# Patient Record
Sex: Female | Born: 1935 | Race: White | Hispanic: No | Smoking: Former smoker
Health system: Southern US, Community
[De-identification: ages and names within clinical notes are randomized; demographics above are authoritative.]

## PROBLEM LIST (undated history)

## (undated) DIAGNOSIS — Z8669 Personal history of other diseases of the nervous system and sense organs: Secondary | ICD-10-CM

## (undated) DIAGNOSIS — T4145XA Adverse effect of unspecified anesthetic, initial encounter: Secondary | ICD-10-CM

## (undated) DIAGNOSIS — K219 Gastro-esophageal reflux disease without esophagitis: Secondary | ICD-10-CM

## (undated) DIAGNOSIS — N3010 Interstitial cystitis (chronic) without hematuria: Secondary | ICD-10-CM

## (undated) DIAGNOSIS — H6980 Other specified disorders of Eustachian tube, unspecified ear: Secondary | ICD-10-CM

## (undated) DIAGNOSIS — C50919 Malignant neoplasm of unspecified site of unspecified female breast: Secondary | ICD-10-CM

## (undated) DIAGNOSIS — R5382 Chronic fatigue, unspecified: Secondary | ICD-10-CM

## (undated) DIAGNOSIS — E119 Type 2 diabetes mellitus without complications: Secondary | ICD-10-CM

## (undated) DIAGNOSIS — M1990 Unspecified osteoarthritis, unspecified site: Secondary | ICD-10-CM

## (undated) DIAGNOSIS — H9190 Unspecified hearing loss, unspecified ear: Secondary | ICD-10-CM

## (undated) DIAGNOSIS — G4700 Insomnia, unspecified: Secondary | ICD-10-CM

## (undated) DIAGNOSIS — N281 Cyst of kidney, acquired: Secondary | ICD-10-CM

## (undated) DIAGNOSIS — M797 Fibromyalgia: Secondary | ICD-10-CM

## (undated) DIAGNOSIS — T8859XA Other complications of anesthesia, initial encounter: Secondary | ICD-10-CM

## (undated) DIAGNOSIS — R42 Dizziness and giddiness: Secondary | ICD-10-CM

## (undated) DIAGNOSIS — J449 Chronic obstructive pulmonary disease, unspecified: Secondary | ICD-10-CM

## (undated) DIAGNOSIS — H6990 Unspecified Eustachian tube disorder, unspecified ear: Secondary | ICD-10-CM

## (undated) HISTORY — DX: Unspecified hearing loss, unspecified ear: H91.90

## (undated) HISTORY — PX: BREAST CYST ASPIRATION: SHX578

## (undated) HISTORY — DX: Dizziness and giddiness: R42

## (undated) HISTORY — DX: Other specified disorders of Eustachian tube, unspecified ear: H69.80

## (undated) HISTORY — DX: Unspecified osteoarthritis, unspecified site: M19.90

## (undated) HISTORY — DX: Chronic fatigue, unspecified: R53.82

## (undated) HISTORY — DX: Cyst of kidney, acquired: N28.1

## (undated) HISTORY — DX: Malignant neoplasm of unspecified site of unspecified female breast: C50.919

## (undated) HISTORY — PX: NISSEN FUNDOPLICATION: SHX2091

## (undated) HISTORY — DX: Insomnia, unspecified: G47.00

## (undated) HISTORY — DX: Gastro-esophageal reflux disease without esophagitis: K21.9

## (undated) HISTORY — DX: Unspecified eustachian tube disorder, unspecified ear: H69.90

## (undated) HISTORY — DX: Fibromyalgia: M79.7

## (undated) HISTORY — DX: Interstitial cystitis (chronic) without hematuria: N30.10

## (undated) HISTORY — DX: Chronic obstructive pulmonary disease, unspecified: J44.9

---

## 1997-07-19 ENCOUNTER — Ambulatory Visit (HOSPITAL_COMMUNITY): Admission: RE | Admit: 1997-07-19 | Discharge: 1997-07-19 | Payer: Self-pay | Admitting: Gastroenterology

## 1997-09-24 ENCOUNTER — Ambulatory Visit (HOSPITAL_COMMUNITY): Admission: RE | Admit: 1997-09-24 | Discharge: 1997-09-24 | Payer: Self-pay | Admitting: Urology

## 1997-12-28 ENCOUNTER — Other Ambulatory Visit: Admission: RE | Admit: 1997-12-28 | Discharge: 1997-12-28 | Payer: Self-pay | Admitting: Gynecology

## 1998-01-04 ENCOUNTER — Ambulatory Visit (HOSPITAL_BASED_OUTPATIENT_CLINIC_OR_DEPARTMENT_OTHER): Admission: RE | Admit: 1998-01-04 | Discharge: 1998-01-04 | Payer: Self-pay | Admitting: Urology

## 1998-07-22 ENCOUNTER — Ambulatory Visit (HOSPITAL_COMMUNITY): Admission: RE | Admit: 1998-07-22 | Discharge: 1998-07-22 | Payer: Self-pay | Admitting: *Deleted

## 1998-07-26 ENCOUNTER — Encounter: Payer: Self-pay | Admitting: *Deleted

## 1998-07-26 ENCOUNTER — Ambulatory Visit (HOSPITAL_COMMUNITY): Admission: RE | Admit: 1998-07-26 | Discharge: 1998-07-26 | Payer: Self-pay | Admitting: *Deleted

## 1998-09-02 ENCOUNTER — Other Ambulatory Visit: Admission: RE | Admit: 1998-09-02 | Discharge: 1998-09-02 | Payer: Self-pay | Admitting: Orthopedic Surgery

## 1998-10-11 ENCOUNTER — Ambulatory Visit (HOSPITAL_BASED_OUTPATIENT_CLINIC_OR_DEPARTMENT_OTHER): Admission: RE | Admit: 1998-10-11 | Discharge: 1998-10-11 | Payer: Self-pay | Admitting: Orthopedic Surgery

## 1999-01-06 ENCOUNTER — Other Ambulatory Visit: Admission: RE | Admit: 1999-01-06 | Discharge: 1999-01-06 | Payer: Self-pay | Admitting: Gynecology

## 1999-01-16 ENCOUNTER — Encounter: Admission: RE | Admit: 1999-01-16 | Discharge: 1999-01-16 | Payer: Self-pay | Admitting: *Deleted

## 1999-01-16 ENCOUNTER — Encounter: Payer: Self-pay | Admitting: *Deleted

## 1999-05-15 ENCOUNTER — Ambulatory Visit (HOSPITAL_COMMUNITY): Admission: RE | Admit: 1999-05-15 | Discharge: 1999-05-15 | Payer: Self-pay | Admitting: Gastroenterology

## 1999-06-16 ENCOUNTER — Encounter: Payer: Self-pay | Admitting: Surgery

## 1999-06-16 ENCOUNTER — Ambulatory Visit (HOSPITAL_COMMUNITY): Admission: RE | Admit: 1999-06-16 | Discharge: 1999-06-16 | Payer: Self-pay | Admitting: Surgery

## 1999-07-21 ENCOUNTER — Encounter: Admission: RE | Admit: 1999-07-21 | Discharge: 1999-07-21 | Payer: Self-pay | Admitting: Urology

## 1999-07-21 ENCOUNTER — Encounter: Payer: Self-pay | Admitting: Urology

## 1999-07-24 ENCOUNTER — Encounter: Admission: RE | Admit: 1999-07-24 | Discharge: 1999-07-24 | Payer: Self-pay | Admitting: Urology

## 1999-07-24 ENCOUNTER — Encounter: Payer: Self-pay | Admitting: Urology

## 1999-07-25 ENCOUNTER — Encounter: Admission: RE | Admit: 1999-07-25 | Discharge: 1999-07-25 | Payer: Self-pay | Admitting: *Deleted

## 1999-07-25 ENCOUNTER — Encounter: Payer: Self-pay | Admitting: *Deleted

## 1999-08-24 ENCOUNTER — Encounter: Payer: Self-pay | Admitting: Internal Medicine

## 1999-08-24 ENCOUNTER — Encounter: Admission: RE | Admit: 1999-08-24 | Discharge: 1999-08-24 | Payer: Self-pay | Admitting: Internal Medicine

## 1999-10-25 ENCOUNTER — Encounter: Payer: Self-pay | Admitting: Surgery

## 1999-10-26 ENCOUNTER — Inpatient Hospital Stay (HOSPITAL_COMMUNITY): Admission: EM | Admit: 1999-10-26 | Discharge: 1999-10-27 | Payer: Self-pay | Admitting: Surgery

## 2000-01-31 ENCOUNTER — Other Ambulatory Visit: Admission: RE | Admit: 2000-01-31 | Discharge: 2000-01-31 | Payer: Self-pay | Admitting: Gynecology

## 2000-02-06 ENCOUNTER — Other Ambulatory Visit: Admission: RE | Admit: 2000-02-06 | Discharge: 2000-02-06 | Payer: Self-pay | Admitting: Gastroenterology

## 2000-04-16 ENCOUNTER — Encounter (INDEPENDENT_AMBULATORY_CARE_PROVIDER_SITE_OTHER): Payer: Self-pay | Admitting: Specialist

## 2000-04-16 ENCOUNTER — Ambulatory Visit (HOSPITAL_COMMUNITY): Admission: RE | Admit: 2000-04-16 | Discharge: 2000-04-16 | Payer: Self-pay | Admitting: Gastroenterology

## 2000-08-12 ENCOUNTER — Encounter: Admission: RE | Admit: 2000-08-12 | Discharge: 2000-08-12 | Payer: Self-pay | Admitting: *Deleted

## 2000-08-12 ENCOUNTER — Encounter: Payer: Self-pay | Admitting: *Deleted

## 2001-02-04 ENCOUNTER — Other Ambulatory Visit: Admission: RE | Admit: 2001-02-04 | Discharge: 2001-02-04 | Payer: Self-pay | Admitting: Obstetrics and Gynecology

## 2001-08-15 ENCOUNTER — Encounter: Payer: Self-pay | Admitting: *Deleted

## 2001-08-15 ENCOUNTER — Encounter: Admission: RE | Admit: 2001-08-15 | Discharge: 2001-08-15 | Payer: Self-pay | Admitting: *Deleted

## 2002-02-04 ENCOUNTER — Other Ambulatory Visit: Admission: RE | Admit: 2002-02-04 | Discharge: 2002-02-04 | Payer: Self-pay | Admitting: Obstetrics and Gynecology

## 2002-08-17 ENCOUNTER — Encounter: Payer: Self-pay | Admitting: *Deleted

## 2002-08-17 ENCOUNTER — Encounter: Admission: RE | Admit: 2002-08-17 | Discharge: 2002-08-17 | Payer: Self-pay | Admitting: *Deleted

## 2002-09-18 ENCOUNTER — Ambulatory Visit: Admission: RE | Admit: 2002-09-18 | Discharge: 2002-09-18 | Payer: Self-pay | Admitting: Endocrinology

## 2002-09-20 ENCOUNTER — Encounter: Payer: Self-pay | Admitting: Emergency Medicine

## 2002-09-20 ENCOUNTER — Emergency Department (HOSPITAL_COMMUNITY): Admission: EM | Admit: 2002-09-20 | Discharge: 2002-09-20 | Payer: Self-pay | Admitting: Emergency Medicine

## 2003-01-08 ENCOUNTER — Other Ambulatory Visit: Admission: RE | Admit: 2003-01-08 | Discharge: 2003-01-08 | Payer: Self-pay | Admitting: Obstetrics and Gynecology

## 2003-01-14 ENCOUNTER — Ambulatory Visit (HOSPITAL_COMMUNITY): Admission: RE | Admit: 2003-01-14 | Discharge: 2003-01-15 | Payer: Self-pay | Admitting: Ophthalmology

## 2003-03-16 ENCOUNTER — Ambulatory Visit (HOSPITAL_COMMUNITY): Admission: RE | Admit: 2003-03-16 | Discharge: 2003-03-16 | Payer: Self-pay | Admitting: Ophthalmology

## 2003-05-24 ENCOUNTER — Encounter: Admission: RE | Admit: 2003-05-24 | Discharge: 2003-05-24 | Payer: Self-pay | Admitting: Gastroenterology

## 2003-06-10 ENCOUNTER — Ambulatory Visit (HOSPITAL_COMMUNITY): Admission: RE | Admit: 2003-06-10 | Discharge: 2003-06-10 | Payer: Self-pay | Admitting: Urology

## 2003-08-19 ENCOUNTER — Encounter: Admission: RE | Admit: 2003-08-19 | Discharge: 2003-08-19 | Payer: Self-pay | Admitting: Surgery

## 2003-11-23 ENCOUNTER — Encounter: Payer: Self-pay | Admitting: Internal Medicine

## 2003-11-25 ENCOUNTER — Ambulatory Visit (HOSPITAL_COMMUNITY): Admission: RE | Admit: 2003-11-25 | Discharge: 2003-11-25 | Payer: Self-pay | Admitting: Neurology

## 2003-12-31 ENCOUNTER — Ambulatory Visit (HOSPITAL_COMMUNITY): Admission: RE | Admit: 2003-12-31 | Discharge: 2003-12-31 | Payer: Self-pay | Admitting: Ophthalmology

## 2004-04-27 ENCOUNTER — Ambulatory Visit: Payer: Self-pay | Admitting: Internal Medicine

## 2004-09-12 ENCOUNTER — Encounter: Admission: RE | Admit: 2004-09-12 | Discharge: 2004-09-12 | Payer: Self-pay | Admitting: Surgery

## 2004-09-19 ENCOUNTER — Encounter: Admission: RE | Admit: 2004-09-19 | Discharge: 2004-09-19 | Payer: Self-pay | Admitting: Surgery

## 2004-09-25 ENCOUNTER — Other Ambulatory Visit: Admission: RE | Admit: 2004-09-25 | Discharge: 2004-09-25 | Payer: Self-pay | Admitting: Obstetrics and Gynecology

## 2004-10-31 ENCOUNTER — Ambulatory Visit: Payer: Self-pay | Admitting: Internal Medicine

## 2004-12-20 ENCOUNTER — Ambulatory Visit: Payer: Self-pay | Admitting: Pulmonary Disease

## 2005-01-31 ENCOUNTER — Encounter: Payer: Self-pay | Admitting: Internal Medicine

## 2005-04-17 ENCOUNTER — Ambulatory Visit: Payer: Self-pay | Admitting: Internal Medicine

## 2005-04-30 ENCOUNTER — Ambulatory Visit: Payer: Self-pay | Admitting: Internal Medicine

## 2005-06-18 ENCOUNTER — Ambulatory Visit: Payer: Self-pay | Admitting: Internal Medicine

## 2005-06-19 ENCOUNTER — Ambulatory Visit: Payer: Self-pay | Admitting: Internal Medicine

## 2005-08-13 ENCOUNTER — Ambulatory Visit: Payer: Self-pay | Admitting: Internal Medicine

## 2005-08-14 ENCOUNTER — Ambulatory Visit: Payer: Self-pay | Admitting: Internal Medicine

## 2005-08-16 ENCOUNTER — Ambulatory Visit: Payer: Self-pay | Admitting: Internal Medicine

## 2005-08-21 ENCOUNTER — Ambulatory Visit: Payer: Self-pay | Admitting: Internal Medicine

## 2005-09-11 ENCOUNTER — Ambulatory Visit: Payer: Self-pay | Admitting: Internal Medicine

## 2005-09-26 ENCOUNTER — Ambulatory Visit: Payer: Self-pay | Admitting: Internal Medicine

## 2005-09-28 ENCOUNTER — Ambulatory Visit: Payer: Self-pay | Admitting: Internal Medicine

## 2005-10-15 ENCOUNTER — Encounter: Admission: RE | Admit: 2005-10-15 | Discharge: 2005-10-15 | Payer: Self-pay | Admitting: Surgery

## 2005-10-22 ENCOUNTER — Encounter: Admission: RE | Admit: 2005-10-22 | Discharge: 2005-10-22 | Payer: Self-pay | Admitting: Surgery

## 2005-12-03 ENCOUNTER — Ambulatory Visit: Payer: Self-pay | Admitting: Internal Medicine

## 2005-12-07 ENCOUNTER — Ambulatory Visit: Payer: Self-pay | Admitting: Internal Medicine

## 2006-04-02 ENCOUNTER — Ambulatory Visit: Payer: Self-pay | Admitting: Internal Medicine

## 2006-04-10 ENCOUNTER — Ambulatory Visit: Payer: Self-pay | Admitting: Internal Medicine

## 2006-04-16 ENCOUNTER — Ambulatory Visit (HOSPITAL_COMMUNITY): Admission: RE | Admit: 2006-04-16 | Discharge: 2006-04-16 | Payer: Self-pay | Admitting: Obstetrics and Gynecology

## 2006-04-26 ENCOUNTER — Ambulatory Visit: Payer: Self-pay | Admitting: Internal Medicine

## 2006-05-23 ENCOUNTER — Ambulatory Visit: Payer: Self-pay | Admitting: Internal Medicine

## 2006-06-28 ENCOUNTER — Ambulatory Visit: Payer: Self-pay | Admitting: Internal Medicine

## 2006-07-31 ENCOUNTER — Ambulatory Visit: Payer: Self-pay | Admitting: Internal Medicine

## 2006-09-05 ENCOUNTER — Ambulatory Visit: Payer: Self-pay | Admitting: Internal Medicine

## 2006-09-27 ENCOUNTER — Ambulatory Visit: Payer: Self-pay | Admitting: Internal Medicine

## 2006-10-29 ENCOUNTER — Other Ambulatory Visit: Admission: RE | Admit: 2006-10-29 | Discharge: 2006-10-29 | Payer: Self-pay | Admitting: Obstetrics and Gynecology

## 2006-11-15 ENCOUNTER — Ambulatory Visit: Payer: Self-pay | Admitting: Internal Medicine

## 2006-11-21 ENCOUNTER — Ambulatory Visit: Payer: Self-pay | Admitting: Internal Medicine

## 2006-12-26 ENCOUNTER — Ambulatory Visit: Payer: Self-pay | Admitting: Internal Medicine

## 2006-12-30 ENCOUNTER — Encounter: Admission: RE | Admit: 2006-12-30 | Discharge: 2006-12-30 | Payer: Self-pay | Admitting: Surgery

## 2007-01-28 ENCOUNTER — Ambulatory Visit: Payer: Self-pay | Admitting: Internal Medicine

## 2007-02-03 ENCOUNTER — Ambulatory Visit: Payer: Self-pay | Admitting: Internal Medicine

## 2007-02-10 ENCOUNTER — Ambulatory Visit: Payer: Self-pay | Admitting: Internal Medicine

## 2007-03-05 ENCOUNTER — Ambulatory Visit: Payer: Self-pay | Admitting: Internal Medicine

## 2007-03-27 ENCOUNTER — Ambulatory Visit: Payer: Self-pay | Admitting: Internal Medicine

## 2007-04-08 ENCOUNTER — Ambulatory Visit: Payer: Self-pay | Admitting: Internal Medicine

## 2007-04-16 ENCOUNTER — Ambulatory Visit: Payer: Self-pay | Admitting: Internal Medicine

## 2007-04-29 ENCOUNTER — Ambulatory Visit: Payer: Self-pay | Admitting: Internal Medicine

## 2007-04-30 DIAGNOSIS — J449 Chronic obstructive pulmonary disease, unspecified: Secondary | ICD-10-CM

## 2007-04-30 DIAGNOSIS — M797 Fibromyalgia: Secondary | ICD-10-CM

## 2007-04-30 DIAGNOSIS — J3089 Other allergic rhinitis: Secondary | ICD-10-CM

## 2007-04-30 DIAGNOSIS — J302 Other seasonal allergic rhinitis: Secondary | ICD-10-CM | POA: Insufficient documentation

## 2007-04-30 DIAGNOSIS — H6980 Other specified disorders of Eustachian tube, unspecified ear: Secondary | ICD-10-CM | POA: Insufficient documentation

## 2007-04-30 DIAGNOSIS — K219 Gastro-esophageal reflux disease without esophagitis: Secondary | ICD-10-CM | POA: Insufficient documentation

## 2007-05-01 ENCOUNTER — Ambulatory Visit: Payer: Self-pay | Admitting: Internal Medicine

## 2007-05-07 ENCOUNTER — Ambulatory Visit: Payer: Self-pay | Admitting: Internal Medicine

## 2007-05-08 ENCOUNTER — Ambulatory Visit: Payer: Self-pay | Admitting: Internal Medicine

## 2007-05-08 ENCOUNTER — Telehealth (INDEPENDENT_AMBULATORY_CARE_PROVIDER_SITE_OTHER): Payer: Self-pay | Admitting: *Deleted

## 2007-05-14 ENCOUNTER — Ambulatory Visit: Payer: Self-pay | Admitting: Internal Medicine

## 2007-05-14 ENCOUNTER — Ambulatory Visit (HOSPITAL_BASED_OUTPATIENT_CLINIC_OR_DEPARTMENT_OTHER): Admission: RE | Admit: 2007-05-14 | Discharge: 2007-05-14 | Payer: Self-pay | Admitting: Internal Medicine

## 2007-05-24 ENCOUNTER — Ambulatory Visit: Payer: Self-pay | Admitting: Internal Medicine

## 2007-05-30 ENCOUNTER — Ambulatory Visit: Payer: Self-pay | Admitting: Internal Medicine

## 2007-05-31 DIAGNOSIS — G4700 Insomnia, unspecified: Secondary | ICD-10-CM | POA: Insufficient documentation

## 2007-06-03 ENCOUNTER — Ambulatory Visit: Payer: Self-pay | Admitting: Internal Medicine

## 2007-06-16 ENCOUNTER — Ambulatory Visit: Payer: Self-pay | Admitting: Internal Medicine

## 2007-06-23 ENCOUNTER — Ambulatory Visit: Payer: Self-pay | Admitting: Internal Medicine

## 2007-06-30 ENCOUNTER — Ambulatory Visit: Payer: Self-pay | Admitting: Internal Medicine

## 2007-07-17 ENCOUNTER — Ambulatory Visit: Payer: Self-pay | Admitting: Internal Medicine

## 2007-07-24 ENCOUNTER — Ambulatory Visit: Payer: Self-pay | Admitting: Internal Medicine

## 2007-07-25 ENCOUNTER — Ambulatory Visit: Payer: Self-pay | Admitting: Internal Medicine

## 2007-08-08 ENCOUNTER — Ambulatory Visit: Payer: Self-pay | Admitting: Internal Medicine

## 2007-10-16 ENCOUNTER — Ambulatory Visit: Payer: Self-pay | Admitting: Internal Medicine

## 2007-12-04 ENCOUNTER — Ambulatory Visit: Payer: Self-pay | Admitting: Internal Medicine

## 2007-12-11 ENCOUNTER — Encounter: Admission: RE | Admit: 2007-12-11 | Discharge: 2007-12-11 | Payer: Self-pay | Admitting: Gastroenterology

## 2007-12-17 ENCOUNTER — Ambulatory Visit: Payer: Self-pay | Admitting: Internal Medicine

## 2008-01-21 ENCOUNTER — Encounter: Admission: RE | Admit: 2008-01-21 | Discharge: 2008-01-21 | Payer: Self-pay | Admitting: Obstetrics and Gynecology

## 2008-02-02 ENCOUNTER — Encounter: Admission: RE | Admit: 2008-02-02 | Discharge: 2008-02-02 | Payer: Self-pay | Admitting: Obstetrics and Gynecology

## 2008-09-20 ENCOUNTER — Telehealth (INDEPENDENT_AMBULATORY_CARE_PROVIDER_SITE_OTHER): Payer: Self-pay | Admitting: *Deleted

## 2008-09-20 ENCOUNTER — Ambulatory Visit: Payer: Self-pay | Admitting: Internal Medicine

## 2008-10-05 ENCOUNTER — Telehealth: Payer: Self-pay | Admitting: Internal Medicine

## 2008-11-02 ENCOUNTER — Ambulatory Visit: Payer: Self-pay | Admitting: Internal Medicine

## 2008-12-10 ENCOUNTER — Ambulatory Visit: Payer: Self-pay | Admitting: Pulmonary Disease

## 2009-01-24 ENCOUNTER — Other Ambulatory Visit: Admission: RE | Admit: 2009-01-24 | Discharge: 2009-01-24 | Payer: Self-pay | Admitting: Obstetrics and Gynecology

## 2009-02-02 ENCOUNTER — Encounter: Admission: RE | Admit: 2009-02-02 | Discharge: 2009-02-02 | Payer: Self-pay | Admitting: Obstetrics and Gynecology

## 2009-07-26 ENCOUNTER — Ambulatory Visit: Payer: Self-pay | Admitting: Internal Medicine

## 2009-11-07 ENCOUNTER — Ambulatory Visit: Payer: Self-pay | Admitting: Pulmonary Disease

## 2009-11-30 ENCOUNTER — Telehealth: Payer: Self-pay | Admitting: Internal Medicine

## 2010-02-15 ENCOUNTER — Encounter
Admission: RE | Admit: 2010-02-15 | Discharge: 2010-02-15 | Payer: Self-pay | Source: Home / Self Care | Attending: Obstetrics and Gynecology | Admitting: Obstetrics and Gynecology

## 2010-03-13 ENCOUNTER — Ambulatory Visit
Admission: RE | Admit: 2010-03-13 | Discharge: 2010-03-13 | Payer: Self-pay | Source: Home / Self Care | Attending: Internal Medicine | Admitting: Internal Medicine

## 2010-03-19 ENCOUNTER — Encounter: Payer: Self-pay | Admitting: Surgery

## 2010-03-19 ENCOUNTER — Encounter: Payer: Self-pay | Admitting: Obstetrics and Gynecology

## 2010-03-30 NOTE — Assessment & Plan Note (Addendum)
Summary: flu shot per Clance/LC  Nurse Visit   Allergies: 1)  ! * Statins  Immunizations Administered:  Influenza Vaccine # 1:    Vaccine Type: Fluvax MCR    Site: left deltoid    Mfr: GlaxoSmithKline    Dose: 0.5 ml    Route: IM    Given by: Kandice Hams CMA    Exp. Date: 08/26/2010    Lot #: [id number]    VIS given: 09/20/09 version given November 07, 2009.  Flu Vaccine Consent Questions:    Do you have a history of severe allergic reactions to this vaccine? no    Any prior history of allergic reactions to egg and/or gelatin? no    Do you have a sensitivity to the preservative Thimersol? no    Do you have a past history of Guillan-Barre Syndrome? no    Do you currently have an acute febrile illness? no    Have you ever had a severe reaction to latex? no    Vaccine information given and explained to patient? yes    Are you currently pregnant? no  Orders Added: 1)  Influenza Vaccine MCR [00025]

## 2010-03-30 NOTE — Assessment & Plan Note (Signed)
Summary: 6 months/apc  pt rescheudled, had to leave. Boone Master CNA/MA  Jul 26, 2009 3:57 PM  CC:  6 month follow up visit-Bronchitis..  Current Medications (verified): 1)  Spiriva Handihaler 18 Mcg  Caps (Tiotropium Bromide Monohydrate) .... Inhale Contents of 1 Capsule Once A Day 2)  Klonopin 2 Mg Tabs (Clonazepam) .... Take 1 By Mouth Once Daily 3)  Wellbutrin Sr 150 Mg Xr12h-Tab (Bupropion Hcl) .... Take 1 By Mouth Once Daily 4)  Nasonex 50 Mcg/act  Susp (Mometasone Furoate) .... As Needed 5)  Allegra 180 Mg  Tabs (Fexofenadine Hcl) .... As Needed 6)  Albuterol 90 Mcg/act  Aers (Albuterol) .... Inhale 2 Puffs Every 4 Hours As Needed 7)  Zetia 10 Mg  Tabs (Ezetimibe) .... Take 1 Tablet By Mouth Once A Day 8)  Vitamin D 2000 Unit Tabs (Cholecalciferol) .... Take 1 By Mouth Once Daily 9)  Cymbalta 30 Mg Cpep (Duloxetine Hcl) .... Take 1 By Mouth Once Daily 10)  Nexium 40 Mg Cpdr (Esomeprazole Magnesium) .... Take 1 By Mouth Once Daily 11)  Amitiza 24 Mcg Caps (Lubiprostone) .... Take 2 By Mouth Once Daily  Allergies (verified): 1)  ! * Statins  Vital Signs:  Patient profile:   75 year old female Height:      65 inches Weight:      173 pounds BMI:     28.89 O2 Sat:      94 % on Room air Pulse rate:   108 / minute BP sitting:   158 / 86  (left arm) Cuff size:   regular  Vitals Entered By: Reynaldo Minium CMA (Jul 26, 2009 2:52 PM)  O2 Flow:  Room air   Medications Added to Medication List This Visit: 1)  Klonopin 2 Mg Tabs (Clonazepam) .... Take 1 by mouth once daily  Other Orders: No Charge Patient Arrived (NCPA0) (NCPA0)

## 2010-03-30 NOTE — Progress Notes (Signed)
Summary: nos appt  Phone Note Call from Patient   Caller: [email or web address] Call For: young Summary of Call: Rsc nos from 10/4 to 11/17. Initial call taken by: Darletta Moll,  November 30, 2009 4:09 PM

## 2010-03-30 NOTE — Miscellaneous (Signed)
Summary: Injection Record/[facility]  Injection Record/[facility]   Imported By: Sherian Rein 07/19/2009 12:34:37  _____________________________________________________________________  External Attachment:    Type:   Image     Comment:   External Document

## 2010-03-30 NOTE — Assessment & Plan Note (Signed)
Summary: rov/jd   Primary Provider/Referring Provider:  Kirby Funk  CC:  Follow up visit-bronchitis.Marland Kitchen  History of Present Illness: 12/04/07- 75 yo woman with hx rhinitis, bronchitis , eustachian dysfunction, and insomnia. Child getting married - will have to fly to [place]. Asks z pak to hold. LOV 4/09. Cough was treated by Dr. Valentina Lucks with Zpak. Holding allergy vaccine at 0.8 ml/wk of 1:50,000 due to local reaction at higher conc. Carries Proair, rarely needed. Sleep concern. NPSG 05/14/07- AHI WNL 3.6/hr. C/O "so tired". Needs 9-10 hrs sleep. Adderall made her "hyper". Hx interstitial cystiitis, fibromyalgia, chronic fatigue. Dr. Donell Beers.  11/02/08- Rhinitis, bronchitis, eustachian dysfunction, insomnia She has been struggling with the care of her ill husband. She takes allegra daily and uses nasonex.  We discussed Flu vax.She has had pneumovax but cant remember when. Asks Payton Mccallum excuse because of her hearing.  March 13, 2010- Rhinitis, bronchitis, eustachian dysfunction, insomnia Nurse-CC :Follow up visit-bronchitis. No major colds. She is worst seasonally with pollen, falling leaves and now has mold problem from a leaking ice maker. 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 Earlier this summer they had to move out for a month after water leaks from [facility] and plumbing. Satisfied with her meds. Especially likes Nasonex Being evaluated by Dr Zachery Dakins for diverticulitis. .      Preventive Screening-Counseling & Management  Alcohol-Tobacco     Smoking Status: quit     Year Quit: 1965     Pack years: 5 years  1 year total  Current Medications (verified): 1)  Spiriva Handihaler 18 Mcg  Caps (Tiotropium Bromide Monohydrate) .... Inhale Contents of 1 Capsule Once A Day 2)  Klonopin 2 Mg Tabs (Clonazepam) .... Take 1 By Mouth Once Daily 3)  Wellbutrin Sr 150 Mg Xr12h-Tab (Bupropion Hcl) .... Take 1 By Mouth Once Daily 4)  Nasonex 50 Mcg/act  Susp (Mometasone Furoate) .... As Needed 5)  Allegra 180 Mg   Tabs (Fexofenadine Hcl) .... As Needed 6)  Albuterol 90 Mcg/act  Aers (Albuterol) .... Inhale 2 Puffs Every 4 Hours As Needed 7)  Zetia 10 Mg  Tabs (Ezetimibe) .... Take 1 Tablet By Mouth Once A Day 8)  Vitamin D 2000 Unit Tabs (Cholecalciferol) .... Take 1 By Mouth Once Daily 9)  Cymbalta 30 Mg Cpep (Duloxetine Hcl) .... Take 1 By Mouth Once Daily 10)  Nexium 40 Mg Cpdr (Esomeprazole Magnesium) .... Take 1 By Mouth Once Daily 11)  Amitiza 24 Mcg Caps (Lubiprostone) .... Take 2 By Mouth Once Daily  Allergies (verified): 1)  ! * Statins  Past History:  Past Medical History: Last updated: 12/04/2007 Chronic bronchitis allergic rhinitis Decreased hearing- intol hearing aids fibromyalgia interstitial cystitis Chronic fatigue eustachian dysfunction- decreased hearing GERD/ Nissen  Past Surgical History: Last updated: 05/01/2007 Nissen fundoplication  Family History: Last updated: 03/13/2010 Asthma-  Biologic  mother and son Adopted  Social History: Last updated: 03/13/2010 Patient states former smoker.  Married  Risk Factors: Smoking Status: quit (03/13/2010)  Family History: Asthma-  Biologic  mother and son Adopted  Social History: Patient states former smoker.  Married  Review of Systems      See HPI       The patient complains of shortness of breath with activity and non-productive cough.  The patient denies shortness of breath at rest, productive cough, coughing up blood, chest pain, irregular heartbeats, acid heartburn, indigestion, loss of appetite, weight change, abdominal pain, difficulty swallowing, sore throat, tooth/dental problems, headaches, nasal congestion/difficulty breathing through nose, sneezing, itching, ear ache,  anxiety, hand/feet swelling, rash, change in color of mucus, and fever.    Vital Signs:  Patient profile:   75 year old female Height:      65 inches Weight:      175.25 pounds BMI:     29.27 O2 Sat:      98 % on Room air Pulse  rate:   103 / minute BP sitting:   124 / 80  (left arm) Cuff size:   regular  Vitals Entered By: Reynaldo Minium CMA (March 13, 2010 1:35 PM)  O2 Flow:  Room air CC: Follow up visit-bronchitis.   Physical Exam  Additional Exam:  General: A/Ox3; pleasant and cooperative, NAD, SKIN: no rash, lesions NODES: no lymphadenopathy HEENT: [place]/AT, EOM- WNL, Conjuctivae- clear, PERRLA, TM-moderate cerumen, Nose- turbinate edema, hard of hearing, Throat- clear and wnl, Mallampati  II NECK: Supple w/ fair ROM, JVD- none, normal carotid impulses w/o bruits Thyroid-  CHEST: Dry cough triggered by deep breath, no wheeze/ rub/ dullness HEART: RRR, no m/g/r heard ABDOMEN: Soft and nl;  YQI:HKVQ, nl pulses, no edema  NEURO: Grossly intact to observation      Impression & Recommendations:  Problem # 1:  BRONCHITIS, CHRONIC (ICD-491.9)  She continues with meds. Cough with deep breath is suggestive, but otherwise she seems to be doing well. Meds refilled.   Problem # 2:  ALLERGIC RHINITIS (ICD-477.9)  Nasonex and antihistamines are main tools for her now.  Her chronic hearing loss would not be helped by any resultant eustachan dysfunction.  Her updated medication list for this problem includes:    Nasonex 50 Mcg/act Susp (Mometasone furoate) .Marland Kitchen... As needed    Allegra 180 Mg Tabs (Fexofenadine hcl) .Marland Kitchen... As needed  Orders: Est. Patient Level III (25956)  Medications Added to Medication List This Visit: 1)  Proair Hfa 108 (90 Base) Mcg/act Aers (Albuterol sulfate) .... 2 puffs four times a day as needed rescue inahler  Patient Instructions: 1)  Please schedule a follow-up appointment in 1 year. 2)  Meds refilled. Please call if these aren't sufficient.  Prescriptions: NASONEX 50 MCG/ACT  SUSP (MOMETASONE FUROATE) as needed  #1 x prn   Entered and Authorized by:   Waymon Budge MD   Signed by:   Waymon Budge MD on 03/13/2010   Method used:   Print then Give to Patient   RxID:    [id number] SPIRIVA HANDIHALER 18 MCG  CAPS (TIOTROPIUM BROMIDE MONOHYDRATE) Inhale contents of 1 capsule once a day  #1 x prn   Entered and Authorized by:   Waymon Budge MD   Signed by:   Waymon Budge MD on 03/13/2010   Method used:   Print then Give to Patient   RxID:   [id number] PROAIR HFA 108 (90 BASE) MCG/ACT AERS (ALBUTEROL SULFATE) 2 puffs four times a day as needed rescue inahler  #1 x prn   Entered and Authorized by:   Waymon Budge MD   Signed by:   Waymon Budge MD on 03/13/2010   Method used:   Print then Give to Patient   RxID:   [id number] NASONEX 50 MCG/ACT  SUSP (MOMETASONE FUROATE) as needed  #1 x prn   Entered and Authorized by:   Waymon Budge MD   Signed by:   Waymon Budge MD on 03/13/2010   Method used:   Electronically to        [facility] [address] [phone]* (retail)  [address]       [place], [place]  [address]       Ph: [phone] or [phone]       Fax: [phone]   RxID:   [id number] SPIRIVA HANDIHALER 18 MCG  CAPS (TIOTROPIUM BROMIDE MONOHYDRATE) Inhale contents of 1 capsule once a day  #1 x prn   Entered and Authorized by:   Waymon Budge MD   Signed by:   Waymon Budge MD on 03/13/2010   Method used:   Electronically to        [facility]. [address] [phone]* (retail)       [address]       [place], [place]  [address]       Ph: [phone] or [phone]       Fax: [phone]   RxID:   [id number]

## 2010-04-12 ENCOUNTER — Ambulatory Visit: Payer: Self-pay | Admitting: Critical Care Medicine

## 2010-07-11 NOTE — Assessment & Plan Note (Signed)
[facility]                             PULMONARY OFFICE NOTE   KENSI, KARR                     MRN:          [id number]  DATE:07/31/2006                            DOB:          Apr 28, 1935    PROBLEMS:  1. Chronic bronchitis.  2. Allergic rhinitis.  3. Fibromyalgia.  4. Eustachian dysfunction/decreased hearing.  5. Esophageal reflux/Nissen.   HISTORY:  Spring sinus congestion which she is treating with an over-the-  counter aspirin sinus symptom product. Increased post-nasal drip.  Temperature changes bother her some. Otherwise she has felt stable with  no real change in her exercise tolerance. She continues allergy vaccine  here at 1-50 with no problems getting shots at long intervals as  planned.   MEDICATIONS:  1. Klonopin.  2. Effexor 75 mg.  3. DMSO.  4. Adderall 1 mg b.i.d.  5. Wellbutrin 100 mg b.i.d.  6. Amitiza.  7. Allegra 180 mg.  8. Albuterol rescue inhaler rarely needed.  9. Prilosec OTC.   ALLERGIES:  No medication allergy.   OBJECTIVE:  Weight 170 pounds, blood pressure 132/84, pulse 103, room  air saturation 95%. She coughs when she takes in a deep breath,  otherwise I do not hear rales or wheeze. Heart sounds are unlabored. She  can follow conversation comfortably in a quiet room one on one.   IMPRESSION:  Rhinitis, eustachian dysfunction, asthma/chronic  obstructive pulmonary disease. She is fairly stable recently.   PLAN:  I encouraged deep breathing, and walking for endurance. Schedule  return 4 months, question advancing vaccine to 1-10 at that time.     Clinton D. Maple Hudson, MD, Tonny Bollman, FACP  Electronically Signed    CDY/MedQ  DD: 08/04/2006  DT: 08/05/2006  Job #: [id number]   cc:   [facility] @ [facility], M.D.

## 2010-07-11 NOTE — Procedures (Signed)
NAME:  Erica Drake, Erica Drake              ACCOUNT NO.:  [id number]   MEDICAL RECORD NO.:  [id number]          PATIENT TYPE:  OUT   LOCATION:  SLEEP CENTER                 FACILITY:  [facility]   PHYSICIAN:  Clinton D. Maple Hudson, MD, FCCP, FACPDATE OF BIRTH:  11-16-37   DATE OF STUDY:  05/14/2007                            NOCTURNAL POLYSOMNOGRAM   REFERRING PHYSICIAN:  Clinton D. Young, MD, FCCP, FACP   INDICATIONS FOR PROCEDURE:  Insomnia with sleep apnea.   RESULTS:  Epward sleepiness score 4/24. BMI 33.3. Weight 174 pounds.  Height 66 inches. Neck 17 inches.   HOME MEDICATIONS:  Charted and reviewed.   SLEEP ARCHITECTURE:  Total sleep time 218 minutes with sleep efficiency  55.1%. Stage 1 was 20.6%; Stage 2, 68.6%; Stage 3, 10.3%; REM 0.5% of  total sleep time. Sleep latency 9.5 minutes. REM latency 200.5 minutes.  Awake after sleep onset 168.5 minutes. Arousal index 10.7. Ambien was  taken at 10:15 p.m. Sustained sleep onset was at about 12:30 a.m. and  she was awake again between 2:30 and 3:00 a.m., then finally awake at  5:00 a.m.   RESPIRATORY DATA:  Apnea/hypopnea index (AHI) 3.6 per hour, which is  within normal limits. There were 13 scored events including 1 mixed  apnea and 6 hypopnea's with most events occurring while sleeping supine.   OXYGEN DATA:  Mild snoring with oxygen desaturation to a nadir of 85%.  Mean oxygen saturation through the study was 90.4%, 3.1 minutes were  spent with oxygen saturation less than 88%, 50.7 minutes were spent with  oxygen saturation less than 90%.   CARDIAC DATA:  Normal sinus rhythm.   MOVEMENT/PARASOMNIA:  No significant movement disturbance. Bathroom  times 1.   IMPRESSION/RECOMMENDATION:  1. Occasional scored respiratory events within normal limits, AHI 3.6      per hour. Most events were hypopnea's, noted while sleeping supine.      Mild snoring with oxygen desaturation to a nadir of 85%.  2. Significant delay in sleep onset and with  sustained interval of      wakefulness during the night, consistent with an insomnia pattern.  3. Mean oxygen saturation a little low and significant time 50.7      minutes, with oxygen saturation less than 90%.      Consider if mild persistent respiratory insufficiency through the      night contributes to some of her sleep discomfort, even though she      does not demonstrate sustained intervals less than 88%.      Clinton D. Maple Hudson, MD, [facility], FACP  Diplomate, Biomedical engineer of Sleep Medicine  Electronically Signed     CDY/MEDQ  D:  05/24/2007 10:27:49  T:  05/24/2007 12:53:25  Job:  [id number]

## 2010-07-11 NOTE — Assessment & Plan Note (Signed)
[facility]                             PULMONARY OFFICE NOTE   LYNCOLN, MASKELL                     MRN:          [id number]  DATE:11/21/2006                            DOB:          02/06/1936    PULMONARY FOLLOWUP:   PROBLEM:  1. Chronic bronchitis.  2. Allergic rhinitis.  3. Fibromyalgia.  4. Eustachian dysfunction/decreased hearing.  5. Esophageal reflux/Nissen.   HISTORY:  Likes the current cool weather.  Occasional maxillary sinus  pressure discomfort and sense of chest tightness she blames on allergy  and dripping.  She does use Claritin and has continued allergy vaccine  at 1/50, little sneezing, nothing purulent.   MEDICATIONS:  1. Spiriva.  2. Klonopin 1 mg daily.  3. Effexor 75 mg.  4. DMSO taken weekly.  5. Wellbutrin 100 mg b.i.d.  6. Nasonex.  7. Zetia.  8. Allegra 180 mg sometimes used as alternative to Claritin.  9. Albuterol rescue inhaler.   No medication allergy.   OBJECTIVE:  Weight 170 pounds, BP 142/80, pulse 113, room air saturation  94%.  Minimal turbinate edema.  No postnasal drainage or pharyngeal erythema.  Conjunctivae are not injected.  No adenopathy.  Chest is quiet, without wheeze, rhonchi or increased work of breathing.  Heart sounds regular without murmur.   IMPRESSION:  Rhinitis and asthma with COPD.   PLAN:  1. Refill benzonatate perles, which she found helpful.  2. Sudafed PE.  3. Flu vaccine.  4. Advance vaccine to 1:10.  5. Continue maintenance schedule.  6. Return six months, early p.r.n.     Clinton D. Maple Hudson, MD, Tonny Bollman, FACP  Electronically Signed    CDY/MedQ  DD: 11/21/2006  DT: 11/22/2006  Job #: [id number]   cc:   Thora Lance, M.D.  Dorisann Frames, M.D.

## 2010-07-14 NOTE — Op Note (Signed)
[facility]  Patient:    Erica Drake, Erica Drake                    MRN: [id number] Proc. Date: 10/24/99 Attending:  Thornton Park. Daphine Deutscher, M.D. CC:         Llana Aliment. Randa Evens, M.D.  Janae Bridgeman. Eloise Harman., M.D.  Ronald L. Ovidio Hanger, M.D.   Operative Report  PREOPERATIVE DIAGNOSIS:  POSTOPERATIVE DIAGNOSIS:  PROCEDURE:  Laparoscopic Nissen fundoplication (three) with crural closure (two).  SURGEON:  Thornton Park. Daphine Deutscher, M.D.  ASSISTANT:  Velora Heckler, M.D.  ANESTHESIA:  INDICATIONS:  The patient has been worked up by Dr. Randa Evens for her reflux and was seen in the office with her husband initially, and this surgery was set up, and then she had a problem with pneumonia, possibly some aspiration.  She had a barium enema which showed good motility, but with free reflux.  She had a gallbladder ultrasound which was negative for stones.  DESCRIPTION OF PROCEDURE:  The patient is a 75 year old lady who was taken to OR #2, at which time, Dr. Earlene Plater first did a cystoscopy and _______ for her underlying interstitial cystitis.  She was then placed in the supine position and was prepped for the Nissen fundoplication with Betadine.  After sterile drapes were applied, I made a longitudinal incision just above her umbilicus. I incised the fascia through a pursestring suture of 0 Vicryl and inserted the Hassan cannula without difficulty.  The abdomen was insufflated and upper abdomen was surveyed.  A total of five trocars were used with a 5 mm in the upper midline, two 10-11s on the right side, one on the left side.  With the 5 mm port in the upper midline, I used the metallic retractor which entered and was assumed its form to enable [place] to retract the left lateral segment. Then, using the harmonic scalpel to open the gastrohepatic ligament and dissect the right crural margin.  This was done and carried anterior, and over to the left crus.  She had a fair amount of  inflammatory changes.  I had her films in the room and could appreciate the hiatal hernia, and free reflux noted on the upper GI.  After dissecting the right crus, I then went and took down the short gastrics, entering about at appropriate level down the fundus, and then carried this superiorly up to the left crus.  I dissected the left crus and found a lot more inflammatory reaction on the left side.  After doing this, I then went back on the patients right and with the Kidner dissectors, went through and created a window.  However, I kept going behind the left crus.  I dissected my way down and created a window which I dilated with a Penrose drain around the distal esophagus, and used that to further delineate the left and right crus.  When this dissection was complete, I closed the crura with two sutures using the Endostitch and extracorporeal ties, and this snug down the crura around the esophagus.  I had preserved the posterior branch of the vagal nerve which I identified, and then it would lie inside the wrap.  I then went around the esophagus and then Dr. Gerrit Friends passed me a suitable portion of the cardia, fundus, and the stomach which came around easily behind the esophagus without tension.  Next, Dr. Rica Mast passed a lighted 50 bougie which came on into the stomach without difficulty.  Again, I felt  that the crural closure of the hiatus was appropriate and it was snug, but not too tight, and the bougie passed in easily.  I then sutured the wrap using three Endostitch applications.  The superior most one took a bite of esophagus which was tied down extracorporeally with Dr. Gerrit Friends holding the knot so that this would be secure.  The subsequent two suture placements were placed intracorporeally again with purchase of esophagus and periesophageal tissue, and these were tied intracorporeally.  Clips were placed on these braided knots for subsequent identification to look at the wrap  in a contrast situation.  Good purchases of the wrap were obtained and good floppy wrap was evident after the 50 dilator was withdrawn.  This is depicted in the chart.  No bleeding was noted.  Everything appeared to be intact.  I surveyed the abdomen as the trocars were withdrawn and no bleeding was seen.  First, the pursestring suture was tied down around the umbilicus and then subsequently the other ports were examined through the left upper quadrant port as the abdomen was deflated.  After all were withdrawn, the wounds were injected with 0.5% Marcaine plain and were closed with 4-0 Vicryl subcutaneously with Benzoin and Steri-Strips on the skin.  The patient seemed to tolerate the procedure well and was taken to the recovery room in satisfactory condition. DD:  10/24/99 TD:  10/24/99 Job: [id number]

## 2010-07-14 NOTE — H&P (Signed)
NAME:  Erica Drake, Erica Drake NO.:  [id number]   MEDICAL RECORD NO.:  [id number]          PATIENT TYPE:  OIB   LOCATION:  2899                         FACILITY:  [facility]   PHYSICIAN:  Guadelupe Sabin, M.D.DATE OF BIRTH:  1935/03/19   DATE OF ADMISSION:  12/31/2003  DATE OF DISCHARGE:  12/31/2003                                HISTORY & PHYSICAL   This was a planned outpatient readmission of this 75 year old white female  admitted for cataract implant surgery of the left eye.   HISTORY OF PRESENT ILLNESS:  This patient had been noted to have bilateral  cataract formation with blurred vision.  She was previously admitted on  March 16, 2003, for uncomplicated cataract implant surgery of the right  eye.  The patient did well following this with return of vision to 20/20  without correction.  Meanwhile subsequently, the unoperated left eye has  continued to deteriorate to 20/400 without correction and improved to only  20/40 with correction.  The patient wished to go without glasses at distance  and stated that she was ready for cataract implant surgery.   PAST MEDICAL HISTORY:  The patient is under the care of Dr. Merril Abbe as her  primary care physician, and Dr. Jetty Duhamel for her pulmonary followup.  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  The patient takes multiple medications including Allegra, Spiriva  inhaler, Advair inhaler, Crestor, Effexor, Zetia, Nasonex, Wellbutrin,  Asterol, Klonopin, Zelnorm.  She is said to be not allergic to any drugs but  has general allergies.   REVIEW OF SYSTEMS:  No cardiorespiratory complaints.  The patient is felt by  Dr. Merril Abbe to be of low surgical risk for the proposed surgery under  local anesthesia.   PHYSICAL EXAMINATION:  VITAL SIGNS:  Blood pressure 128/69, pulse 83,  temperature 97.  GENERAL:  The  patient is a pleasant, well-nourished, well-developed, 67-year-  old female in no acute distress.  HEENT:  Eyes:  Visual acuity as noted above.  Slit lamp examination:  The  eyes ae white and clear with a clear cornea, deep and clear anterior  chamber.  The right eye shows a posterior chamber clear, well-centered lens,  and the left eye nuclear and posterior subcapsular cataract change.  Applanation tonometry 12 mm right eye, 14 left eye.  CHEST:  Lungs clear to percussion and auscultation.  HEART:  Normal sinus rhythm.  No cardiomegaly, no murmurs.  ABDOMEN:  Negative.  EXTREMITIES:  Negative.   ADMISSION DIAGNOSES:  1.  Senile nuclear and posterior subcapsular cataract, left eye.  2.  Pseudophakia, right eye.   SURGICAL PLAN:  Cataract implant surgery, left eye.       HNJ/MEDQ  D:  01/01/2004  T:  01/01/2004  Job:  [id number]   cc:   Ike Bene, M.D.  [address]  [place]  [place] [address]  Fax: [phone]   Clinton D. Young, M.D.  [address]  [place]  [place]  [address]  Fax: [phone]

## 2010-07-14 NOTE — Op Note (Signed)
NAME:  Erica Drake, Erica Drake NO.:  [id number]   MEDICAL RECORD NO.:  [id number]          PATIENT TYPE:  OIB   LOCATION:  2899                         FACILITY:  [facility]   PHYSICIAN:  Guadelupe Sabin, M.D.DATE OF BIRTH:  01/15/1936   DATE OF PROCEDURE:  12/31/2003  DATE OF DISCHARGE:  12/31/2003                                 OPERATIVE REPORT   PREOPERATIVE DIAGNOSIS:  Nuclear and subscapular cataract, left eye.   POSTOPERATIVE DIAGNOSIS:  Nuclear and subscapular cataract, left eye.   NAME OF OPERATION:  Planned extracapsular cataract extraction,  phacoemulsification and primary insertion of posterior chamber intraocular  lens implant.   SURGEON:  Guadelupe Sabin, M.D.   ASSISTANT:  Nurse.   ANESTHESIA:  Local 4% Xylocaine, 0.75 Marcaine, retrobulbar block with  Wydase added, topical tetracaine and intraocular Xylocaine.  Anesthesia  standby required in this 75 year old patient.  The patient was given sodium  pentothal intravenously during the period of retrobulbar blocking.   DESCRIPTION OF PROCEDURE:  After the patient was prepped and draped, a lid  speculum was inserted in the left eye.  The eye was turned downward and a  superior rectus traction suture placed.  Schiotz tonometry was recorded at 7  scale units with a 5.5 g weight.  A peritomy was performed adjacent to the  limbus from the 11 to 1 o'clock position.  The corneoscleral junction was  cleaned and a corneoscleral groove made with a 45 degree Superblade.  The  anterior chamber was then entered with the 2.5 mm diamond keratome at the 12  o'clock position and the 15 degree blade at the 2:30 position.  Using a bent  26 gauge needle on an Occucoat syringe, a circular capsulorrhexis was begun  and then completed with the Grabow forceps.  Hydrodissection and  hydrodelineation were performed using 1% Xylocaine.  The 30 degree  phacoemulsification tip was then inserted with slow controlled  emulsification  of the rather soft lens nucleus.  The total ultrasonic time  was 29 seconds.  Average power level was 13%.  Total amount of fluid used 25  mL.  Following removal of the nucleus, the residual cortex was aspirated  with the silicone tipped irrigation-aspiration probe.  The posterior capsule  appeared intact with a brilliant red fundus reflex.  It was therefore  elected to insert an Allergan Medical Optics SI40NB silicone three-piece  posterior chamber intraocular lens implant.  Diopter strength +22.00.  This  was inserted with the McDonald forceps into the anterior chamber and then  centered into the capsular bag using the [facility] lens rotator.  The lens  appeared to be well centered.  The Provisc and Occucoat, which had been used  during the procedure, were aspirated and replaced with balanced salt  solution and Miochol ophthalmic solution.  The operative incisions appeared  to be self-sealing.  It was elected, however, to place a single 10-0  interrupted nylon suture across the 12 o'clock incision to ensure closure  and  to prevent endophthalmitis.  Maxitrol ointment was instilled in the  conjunctival cul-de-sac and a light patch and protective shield  applied.  The duration of the procedure and anesthesia administration was 45 minutes.  The patient tolerated the procedure well in general and left the operating  room for the recovery room in good condition.       HNJ/MEDQ  D:  01/01/2004  T:  01/02/2004  Job:  [id number]

## 2010-07-14 NOTE — Op Note (Signed)
[facility]  Patient:    Erica Drake, Erica Drake                    MRN: [id number] Proc. Date: 10/24/99 Attending:  Windy Fast L. Ovidio Hanger, M.D.                           Operative Report  PREOPERATIVE DIAGNOSES:  Interstitial cystitis.  POSTOPERATIVE DIAGNOSES:  Interstitial cystitis.  OPERATION PERFORMED:  Cystourethroscopy, hydraulic bladder distention and Clorpactin installation.  SURGEON:  Gaynelle Arabian, M.D.  ANESTHESIA:  General endotracheal.  ESTIMATED BLOOD LOSS:  Negligible.  TUBES:  16 French Foley.  COMPLICATIONS:  None.  INDICATIONS FOR PROCEDURE:  Ms. Heffler is a lovely 75 year old white female with a long history of interstitial cystitis. She is to undergo a nissen fundoplication and his Clorpactin and hydraulic bladder distention have helped in the past. She has continued to have progressive urgency, frequency and suprapubic pain relieved by voiding and wishes to proceed. She understands the risks, benefits, and alternatives and wishes to proceed accordingly.  DESCRIPTION OF PROCEDURE:  The patient was placed in supine position after proper general endotracheal anesthesia was placed in the dorsal lithotomy position and prepped and draped with Betadine in a sterile fashion. Cystourethroscopy was performed with a 22.5 [place] [facility] panendoscope utilizing the 30 and 70 degree lenses. The bladder was carefully inspected. Efflux of clear urine was noted from the normally placed ureteral orifices bilaterally and there were no lesions in the bladder or the urethra. Hydraulic bladder distention was then performed to 80 cm of water and the capacity was noted to be 700 cc. The bladder was drained and there was some posterior glomerulations but no other lesions were noted. Utilizing a 20 French red rubber catheter, installation of 2 gm of Clorpactin dissolved in solution, 1000 cc were performed each with two 500 cc boluses of it. Then the bladder  was serially washed with saline solution. Reinspection revealed no lesions were present. 20 cc of xylocaine GU solution were placed. A 16 French Foley catheter was placed and the case was turned over to Dr. Daphine Deutscher. DD:  10/24/99 TD:  10/24/99 Job: [id number]

## 2010-07-14 NOTE — H&P (Signed)
NAME:  Erica Drake, Erica Drake                        ACCOUNT NO.:  [id number]   MEDICAL RECORD NO.:  [id number]                   PATIENT TYPE:  OIB   LOCATION:  2888                                 FACILITY:  [facility]   PHYSICIAN:  Guadelupe Sabin, M.D.             DATE OF BIRTH:  07/04/1935   DATE OF ADMISSION:  03/16/2003  DATE OF DISCHARGE:  03/16/2003                                HISTORY & PHYSICAL   This was a planned outpatient surgical admission of this 75 year old white  female admitted for cataract implant surgery of the right eye.   HISTORY OF PRESENT ILLNESS:  This patient has been followed in my office for  routine eye care since May 17, 1974.  Patient's visual acuity at that time  was 20/20 without correction.  Patient has gradually developed cataract  formation in both eyes, greater in the right than left eye, although very  similar in both eyes.  Vision has deteriorated to 20/70 right eye, 20/30-  left eye.  The patient has elected to proceed with cataract implant surgery,  hoping for improved vision.  Patient signed an informed consent, and  arrangements were made for her outpatient admission at this time.   PAST MEDICAL HISTORY:  Patient has a history of smoking one pack of  cigarettes per day when first seen and has developed chronic bronchitis.  Is  under the care of Dr. Maple Hudson for her chronic and recurrent bronchitis.  Patient takes multiple medications under his care and also under Dr. Maurine Minister  Kohut's care.  These include atenolol, Wellbutrin, Effexor, Spiriva inhaler,  ___________, Crestor, Klonopin, and albuterol inhaler.   REVIEW OF SYSTEMS:  CARDIORESPIRATORY:  Patient states that she has a  chronic cough but at the present time appears to be stable.   PHYSICAL EXAMINATION:  VITAL SIGNS:  As recorded on admission, blood  pressure 115/71, pulse 86, respirations 18, temperature 97.8.  GENERAL:  Patient is a pleasant, well-developed and well-nourished 67-year-  old white female in no acute distress.  HEENT:  Eyes:  Visual acuity is noted above.  Slit lamp examination:  The  eyes are white and clear with nuclear cataract formation, greater in the  right than left eye.  Detailed fundus examination:  Dilated with Mydriacyl  ophthalmic solution reveals a clear vitreous, attached retina with normal  optic nerve, blood vessels, and macula.  CHEST/LUNGS:  Clear to auscultation and percussion.  No rales, rhonchi or  wheezes heard.  HEART:  Normal sinus rhythm.  No cardiomegaly.  No murmurs.  ABDOMEN:  Negative.  EXTREMITIES:  Negative.   ADMISSION DIAGNOSIS:  Nuclear cataract, both eyes.   SURGICAL PLAN:  Cataract implant surgery, right eye now, left eye later.  Guadelupe Sabin, M.D.    HNJ/MEDQ  D:  03/16/2003  T:  03/16/2003  Job:  [id number]

## 2010-07-14 NOTE — Procedures (Signed)
[place]. [facility]  Patient:    Erica Drake, Erica Drake                     MRN: [id number] Proc. Date: 05/15/99 Adm. Date:  [id number] Disc. Date: [id number] Attending:  Orland Mustard CC:         Janae Bridgeman. Eloise Harman., M.D.             Ronald L. Ovidio Hanger, M.D.             Jamison Neighbor, M.D.             [place] Daphine Deutscher, M.D.                           Procedure Report  PROCEDURE: 1. Esophageal manometry. 2. A 24-hour esophageal pH study.  ENDOSCOPIST:  Llana Aliment. Randa Evens, M.D.  INDICATIONS:  Persistent symptoms of severe esophageal reflux despite aggressive therapy.  She has had previous endoscopies showing no gross abnormalities.  Due to the patients continuing symptoms and lack of ability to control these despite double dose proton pump inhibitor, she had been considered for an antireflux operation.  DESCRIPTION OF PROCEDURE:  Procedures were performed at the [facility] in the usual manner.  The patient initially had a manometry using the electronic Sandoz system.  Then, a 24-hour pH probe was placed and this was maintained in place for a total of 24 hours and 1 minute.  _______ patient returned to the lab and the probe was removed.  The results are as follows:  1. Esophageal manometry:    a. Upper esophageal sphincter with positive pharyngeal spikes that appears to       relax with swallowing.    b. Esophageal body mean amplitude 62 mm in the distal esophagus mean duration of       2.7 seconds.  Peristalsis is normal on all wet swallows.    c. Lower esophageal sphincter 19 mm, in the low limit of normal with relaxation       with swallowing. 2. A 24-hour pH probe.  A probe in place a total of 24 hours and 1 minute with he    following results:    a. Upright position 93 episodes of reflux during 9 hours and 15 minutes. One       episode lasting 16 minutes.  There was a total of 62 minutes of reflux or       ____ of the  time.    b. Supine - 136 episodes of reflux during 14 hours and 46 minutes.  One episode       lasted 33 minutes.  This occurred 28.8% of the time.    c. Total of 299 minutes of reflux in both the upright and supine position,       longest lasting 33 minutes.  Good correlation with all of her symptoms.       Composite score 115, normal being less than 22.  IMPRESSION: 1. Essentially normal manometry with high potential of lower esophageal sphincter. 2. Severe esophageal reflux disease with the patient _______ refluxing 21% of the    time total, and refluxing 27% of the time in the supine position.  I think she    would be an excellent candidate for an antireflux operation.  PLAN:  Will make arrangements for her to see Molli Hazard B. Daphine Deutscher, M.D. for consideration of an antireflux operation.  DD:  05/23/99 TD:  05/24/99 Job: [id number]

## 2010-07-14 NOTE — Discharge Summary (Signed)
[facility]  Patient:    [name], [name]                     MRN: [id number] Adm. Date:  [id number] Disc. Date: [id number] Attending:  Katha Cabal Dictator:   [place] Daphine Deutscher, M.D. CC:         Lucrezia Starch. Ovidio Hanger, M.D.  Janae Bridgeman. Eloise Harman., M.D.   Discharge Summary  ADMISSION DIAGNOSES: 1. Gastroesophageal reflux disease. 2. Interstitial cystitis.  PROCEDURES:  Laparoscopic Nissen fundoplication, Clorpactin bladder installation.  COURSE IN THE HOSPITAL:  Erica Drake is a 75 year old lady who was brought in on August 28 and underwent a cystoscopy with bladder distention and Clorpactin installation for her interstitial cystitis.  This was followed by laparoscopic Nissen fundoplication and closure of her crura.  Postoperatively, she had some congestion and had tachycardia.  A Gastrografin swallow was performed on August 29 which showed no evidence of any leak and good wrap.  She was swallowing fine and improved but was not ready for discharge on August 30.  She was still having congestion and had a low-grade fever at 100.8.  She improved urologically and was ready for discharge on August 31.  She was given [employer] (25) to take for pain.  Her condition was good.  She was given instructions on what to avoid in terms of diet and seemed to be well prepared for discharge.  She will be followed up in the office in approximately two weeks.  FINAL DIAGNOSES: 1. Gastroesophageal reflux disease status post laparoscopic Nissen    fundoplication. 2. Interstitial cystitis status post Clorpactin installation.DD:  10/27/99 TD:  10/28/99 Job: [id number]

## 2010-07-14 NOTE — Procedures (Signed)
[place]. [facility]  Patient:    Erica Drake, Erica Drake                     MRN: [id number] Proc. Date: 04/16/00 Adm. Date:  [id number] Attending:  Orland Mustard CC:         Janae Bridgeman. Eloise Harman., M.D.  [place] Daphine Deutscher, M.D.  Jamison Neighbor, M.D.  Ronald L. Ovidio Hanger, M.D.   Procedure Report  PROCEDURE PERFORMED:  Colonoscopy with biopsy.  ENDOSCOPIST:  Llana Aliment. Randa Evens, M.D.  MEDICATIONS USED:  Fentanyl 125 mcg, Versed 12 mg IV.  INSTRUMENT:  Pediatric Olympus video colonoscope.  INDICATIONS:  Patient with rectal bleeding.  Has had chronic diarrhea and irritable bowel syndrome.  No known family history of colon cancer.  She is adopted.  She had a colonoscopy approximately 13 years ago, has had sigmoidoscopies which have shown mild chronic inflammation of minimal nature. Due to her recent onset of rectal bleeding and chronic diarrhea and history of diarrhea and her age, colonoscopy is performed.  DESCRIPTION OF PROCEDURE:  The procedure had been explained to the patient and consent obtained.  With the patient in the left lateral decubitus position, the pediatric video colonoscope was inserted and advanced under direct visualization.  The prep was excellent and we were able to advance to the cecum without difficulty.  The ileocecal valve was seen and entered for a short distance.  The terminal ileum was normal.  The scope was withdrawn and the colonic mucosa throughout the entire colon was completely normal with good vascular pattern, no obvious evidence of any ongoing colitis.  We did take random biopsies in jar #1 was the ascending and transverse colon.  Jar #2 was the descending and left colon.  No polyps were seen.  No significant diverticular disease.  ASSESSMENT: 1. Essentially normal mucosa with no obvious evidence of colitis. 2. Internal hemorrhoids probably the source of her bleeding.  PLAN: Will check pathology results and see  back in the office in six to eight weeks. DD:  04/16/00 TD:  04/17/00 Job: [id number]

## 2010-07-14 NOTE — Op Note (Signed)
NAME:  Erica Drake, Erica Drake                        ACCOUNT NO.:  [id number]   MEDICAL RECORD NO.:  [id number]                   PATIENT TYPE:  OIB   LOCATION:  2888                                 FACILITY:  [facility]   PHYSICIAN:  Guadelupe Sabin, M.D.             DATE OF BIRTH:  [birth year]   DATE OF PROCEDURE:  DATE OF DISCHARGE:  03/16/2003                                 OPERATIVE REPORT   PREOPERATIVE DIAGNOSIS:  Senile nuclear cataract, right eye.   PREOPERATIVE DIAGNOSIS:  Senile nuclear cataract, right eye.   OPERATION:  Planned extracapsular cataract extraction, phacoemulsification,  primary insertion of posterior chamber intraocular lens implant.   SURGEON:  Dr. Stormy Fabian.   ASSISTANT:  Nurse.   ANESTHESIA:  Local 4% Xylocaine, 0.75% Marcaine.  Anesthesia standby  required.  Patient given sodium Pentothal intravenously during the period of  retrobulbar blocking.   OPERATIVE PROCEDURE:  After the patient was prepped and draped, the lid  speculum was inserted into the right eye.  The eye was turned downward, and  a superior rectus traction suture placed.  A peritomy was performed adjacent  to the limbus from the 11 to the 1 o'clock position.  The corneoscleral  junction was cleaned, and a corneoscleral groove made with a 45 degree super  blade.  The anterior chamber was then entered with a 2.5 mm diamond keratome  at the 12 o'clock position and a 15 degree blade at the 2:30 position.  Using a bent 26 gauge needle on an OcuCoat syringe, a capsulorrhexis was  begun with slight manipulation of the capsule, which was rather thick and  under pressure, suddenly a split vertically from the 12 to 6 o'clock  position.  Other areas were opened but was not able to accomplish a circular  capsulorrhexis.  The 30 degree phacoemulsification tip was then inserted  after hydrodissection and hydrodelineation with 1% Xylocaine.  The nucleus  was rather soft, and limited phacoemulsification  was necessary.  Total  ultrasonic time was 29 seconds.  Average power level 12%.  Total amount of  fluid used 50 cc.  Following removal of the nucleus, the residual cortex was  aspirated with the irrigation aspiration tip.  The posterior capsule  appeared intact with a brilliant red fundus reflex.  It was therefore  elected to insert an Allergan Medical Optics [id number] silicone three-piece  posterior chamber intraocular lens implant.  Diopter strength +22.00.  This  was inserted with the McDonald forceps into the anterior chamber and then  centered into the capsular bag using the [facility] lens rotator.  The lens  appeared to be well centered.  The [place] and OcuCoat, which had been used  during the procedure, was aspirated, and replaced with balance salt solution  and Miochol ophthalmic solution.  The patient began to experience some pain  at this point in the latter portion of the operation.  Due to her chronic  cough,  it was elected to place on 10-0 interrupted nylon suture across the  corneal incision at the 12 o'clock  position.  Maxitrol ointment was instilled in the conjunctival cul de sac,  and a light patch and protector shield applied.  The duration of the  procedure and anesthesia administration 45 minutes.  Patient tolerated the  procedure well in general and left the operating room for the postop  recovery room in good condition.                                               Guadelupe Sabin, M.D.    HNJ/MEDQ  D:  03/16/2003  T:  03/16/2003  Job:  [id number]   cc:   Brooke Bonito, M.D.  [address]  [place]  [place] [address]  Fax: [phone]   Clinton D. Young, M.D.  [address] [place]  [place] [address]  Fax: [phone]
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
## 2010-07-14 NOTE — Procedures (Signed)
[facility]  Patient:    Erica Drake, Erica Drake                     MRN: [id number] Proc. Date: 04/16/00 Adm. Date:  [id number] Attending:  Orland Mustard CC:         Janae Bridgeman. Eloise Harman., M.D.  [place] Daphine Deutscher, M.D.  Jamison Neighbor, M.D.  Ronald L. Ovidio Hanger, M.D.   Procedure Report  PROCEDURE:  Colonoscopy and biopsy.  ENDOSCOPIST:  Llana Aliment. Edwards, M.D.  MEDICATIONS:  Fentanyl 125 mcg, Versed 12 mg IV.  SCOPE:  Pediatric Olympus video colonoscope.  INDICATIONS:  Patient with rectal bleeding.  She has had chronic diarrhea and IBS.  No known family history of colon cancer.  She is adopted.  She had a colonoscopy approximately 13 years ago.  She has had sigmoidoscopy which have shown mild colonic inflammation of a minimal nature.  Due to her recent onset of rectal bleeding and chronic diarrhea and her age, colonoscopy is performed.  DESCRIPTION OF PROCEDURE:  The procedure had been explained to the patient and consent obtained.  With the patient in the left lateral decubitus position, the pediatric Olympus video colonoscope was inserted and advanced under direct visualization.  The prep was excellent.  We were able to advance to the cecum without difficulty.  The ileocecal valve was seem and entered for a short distance.   The terminal ileum was normal.  The scope was withdrawn, and the colonic mucosa throughout the entire colon was completely normal with good vascular pattern, no obvious evidence of any ongoing colitis.  We did take random biopsies, and jar #1 was the ascending and transverse colon; jar #2 was the descending and left colon.  No polyps were seen.  No significant diverticular disease.  ASSESSMENT: 1. Essentially normal mucosa with no obvious evidence of colitis. 2. Internal hemorrhoids, probably the source of her bleeding.  PLAN:  Will check pathology results and see her back in the office in six to eight weeks. DD:   04/16/00 TD:  04/17/00 Job: [id number]

## 2010-07-14 NOTE — Assessment & Plan Note (Signed)
[facility]                               PULMONARY OFFICE NOTE   Erica Drake, Erica Drake                     MRN:          [id number]  DATE:12/03/2005                            DOB:          [birth year]    PROBLEM:  1. Chronic bronchitis.  2. Allergic rhinitis.  3. Fibromyalgia.  4. Eustachian dysfunction/decreased hearing.  5. Esophageal reflux.   HISTORY:  She returns for scheduled follow up saying that she breathes  fast but stable with no distress.  She says she always has a feeling there  is something in her throat.  She had a previous Nissen Fundoplication but  has woken recently with definite reflux at night, despite using over the  counter Prilosec.  She dislikes Qvar and stopped.  Asks refill Allegra.  She  gets her allergy vaccine here with no problems.  She asks flu vaccine.  She  expresses concern about mildly elevated blood sugars, saying that she is  having a lot of sweating without fever.  She asked if allergies could cause  sweating and I told her not usually.  She pulled out several pages of lab  results and asked if I could help her see an endocrinologist.  She has  already worked with her primary physician on these issues and I explained  ordinarily she would seek referral and advice from her primary physician.  When she persisted, I agreed to refer to Dr. Talmage Nap, within the [facility]  network.   MEDICATION:  1. Spiriva.  2. Clonazepam 1.5 mg.  3. Effexor 75 mg x2.  4. DMSO weekly.  5. Adderall 5 mg b.i.d.  6. Wellbutrin 100 mg b.i.d.  7. Qvar 40 has been discontinued.  8. Allegra 180.  9. Albuterol rescue inhaler used as needed.  10.Prilosec OTC used once daily.   No medication allergy.   OBJECTIVE:  Weight 178 pounds. Blood pressure 158/86.  Pulse regular 114.  Room air saturation 93%.  Quite hard of hearing without change.  Mild hoarsness, throat is a little red and voice breaks frequently with no  strider, no  visible drainage or exudate.  No thrush, no cervical adenopathy.  I do not feel thyroid.  LUNGS:  Are clear with no cough or wheeze.  HEART:  Sounds regular, a little rapid without murmur or gallop.   IMPRESSION:  1. Asthma with chronic obstructive pulmonary disease, has been stable.  2. Esophageal reflux despite history on Nissen.  I have asked that she      increase her Prilosec to b.i.d., get more aggressive about reflux      precautions, and discuss this with her primary physician.  3. Mild tachycardia and complaints of sweating.  May have an autonomic      nervous system, hormonal or other basis.   PLAN:  1. Flu vaccine.  Prilosec b.i.d., and referral for evaluation of blood      sugar and questionable associated autonomic instability to Dr. Talmage Nap      and followup with her primary physician as discussed.  2. Schedule return here for allergy vaccine follow up  in 4 months, earlier      as needed.       Clinton D. Maple Hudson, MD, [facility], FACP      CDY/MedQ  DD:  12/03/2005  DT:  12/05/2005  Job #:  [id number]   cc:   Erica Drake, M.D.  Erica Drake, M.D.

## 2010-07-14 NOTE — H&P (Signed)
NAME:  Erica Drake, Erica Drake                        ACCOUNT NO.:  [id number]   MEDICAL RECORD NO.:  [id number]                   PATIENT TYPE:  OIB   LOCATION:  2888                                 FACILITY:  [facility]   PHYSICIAN:  Guadelupe Sabin, M.D.             DATE OF BIRTH:  11-02-1935   DATE OF ADMISSION:  03/16/2003  DATE OF DISCHARGE:  03/16/2003                                HISTORY & PHYSICAL   ADMISSION NOTE:  This was a planned, outpatient surgical admission of this  75 year old white female admitted for cataract implant surgery of the right  eye.   HISTORY OF PRESENT ILLNESS:  This patient has been followed in my office for  routine eye care since May 17, 1974.  At that time, her vision was  recorded without correction at 20/20 in each eye.  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  More  recently, the patient has noted the onset of blurred vision in both eyes due  to nuclear cataract formation.  When vision deteriorated to 20/60 minus with  best correction at distance and 20/200 at near, it was felt that she  warranted cataract surgery.  The patient was given oral discussion and  printed information concerning the procedure and its possible complications.  She signed an informed consent, and arrangements were made for her  outpatient admission initially in November of 2004.  This however was  cancelled, and she was rescheduled at this time.   PAST MEDICAL HISTORY:  The patient has a history of chronic bronchitis and  has been taking multiple medications under the directions of Dr. Brooke Bonito  and her pulmonary physician, Dr. Maple Hudson.   CURRENT MEDICATIONS:  Wellbutrin, Adderall, Spiriva, Zetia, Crestor,  Allegra, Benzonatate,  Advair 250/50.   She is felt to be by Dr. Maple Hudson a low-risk for the proposed surgery under  local anesthesia.   REVIEW OF  SYSTEMS:  No current cardiorespiratory complaints.   PHYSICAL EXAMINATION:  VITAL SIGNS:  As recorded on admission.  Blood  pressure 115/71, pulse 86, respirations 18, temperature 97.8.  GENERAL:  The patient is a pleasant, well-nourished, well-developed white  female, in no acute distress.  HEENT:  Eyes:  Visual acuity as noted above.  Slit lamp examination, the  eyes are white and clear with nuclear cataract formation.  Detailed fundus  examination reveals a clear vitreous, attached retina with normal optic  nerve blood vessels and macula.  Peripheral retinal degeneration is noted as  seen previously.  CHEST:  Lungs clear to percussion and auscultation.  HEART:  Normal sinus rhythm, no cardiomegaly, no murmurs.  ABDOMEN:  Negative.  EXTREMITIES:  Negative.   ADMISSION DIAGNOSES:  Senile cataracts both eyes, right eye greater than  left.   SURGICAL PLAN:  Cataract implant surgery right eye now, left eye as  needed.                                                Guadelupe Sabin, M.D.    HNJ/MEDQ  D:  04/10/2003  T:  04/10/2003  Job:  [id number]   cc:   Brooke Bonito, M.D.  [address]  [place]  [place] [address]  Fax: [phone]   Clinton D. Young, M.D.  [address] [place]  [place] [address]  Fax: [phone]

## 2010-10-04 ENCOUNTER — Telehealth: Payer: Self-pay | Admitting: Internal Medicine

## 2010-10-04 MED ORDER — ALBUTEROL SULFATE HFA 108 (90 BASE) MCG/ACT IN AERS: 2.0000 | INHALATION_SPRAY | Freq: Four times a day (QID) | RESPIRATORY_TRACT | Status: DC | PRN

## 2010-10-04 MED ORDER — TIOTROPIUM BROMIDE MONOHYDRATE 18 MCG IN CAPS: 18.0000 ug | ORAL_CAPSULE | Freq: Every day | RESPIRATORY_TRACT | Status: DC

## 2010-10-04 NOTE — Telephone Encounter (Signed)
Spoke with pt. She states that she has noticed some wheezing recently. She feels that this is b/c she stopped her spiriva a few wks ago. She is also out of her proair. I advised that she should not stop meds without asking her doc and she verbalized understanding. I advised that I will refill proair and spiriva now, but if this does not help with wheezing, and she needs inhaler more than 2 times per wk, she needs to call for appt asap. Pt verbalized understanding.

## 2010-10-09 ENCOUNTER — Telehealth: Payer: Self-pay | Admitting: Internal Medicine

## 2010-10-09 NOTE — Telephone Encounter (Signed)
Pt set to see CY thursday at 3:30pm.Jennifer Summit, CMA

## 2010-10-11 ENCOUNTER — Encounter: Payer: Self-pay | Admitting: Internal Medicine

## 2010-10-12 ENCOUNTER — Encounter: Payer: Self-pay | Admitting: Internal Medicine

## 2010-10-12 ENCOUNTER — Ambulatory Visit (INDEPENDENT_AMBULATORY_CARE_PROVIDER_SITE_OTHER): Payer: Medicare Other | Admitting: Internal Medicine

## 2010-10-12 VITALS — BP 154/84 | HR 110 | Ht 65.0 in | Wt 179.2 lb

## 2010-10-12 DIAGNOSIS — J309 Allergic rhinitis, unspecified: Secondary | ICD-10-CM

## 2010-10-12 DIAGNOSIS — J42 Unspecified chronic bronchitis: Secondary | ICD-10-CM

## 2010-10-12 MED ORDER — METHYLPREDNISOLONE ACETATE 80 MG/ML IJ SUSP
80.0000 mg | Freq: Once | INTRAMUSCULAR | Status: AC
  Administered 2010-10-12: 80 mg via INTRAMUSCULAR

## 2010-10-12 MED ORDER — ALBUTEROL SULFATE (2.5 MG/3ML) 0.083% IN NEBU
2.5000 mg | INHALATION_SOLUTION | Freq: Once | RESPIRATORY_TRACT | Status: AC
  Administered 2010-10-12: 2.5 mg via RESPIRATORY_TRACT

## 2010-10-12 NOTE — Patient Instructions (Signed)
Okay to use the Proair rescue inhaler up to 4 times daily as needed. If you need it that often ongoing, I have other tools.  Today- neb albut              Depo 80

## 2010-10-12 NOTE — Progress Notes (Signed)
Subjective:    Patient ID: Erica Drake, female    DOB: [birth year], 75 y.o.   MRN: [id number]  HPI 10/12/10- Asthma Last here -March 13, 2010-  Usually expects flare with seasonal pollens spring and Fall. Airflight is a trigger . Now for 3 weeks after latest filight to [place] she has wheezed.  Chest tight. She had been out of meds and called August 8 for refills of Proair and Spiriva. Has not used rescue inhaler, but is using Nasonex and Spiriva.  We discussed asthma as best diagnosis for her. Denies any head or nasal congestion or any reflux/ heartburn Review of Systems Constitutional:   No-   weight loss, night sweats, fevers, chills, fatigue, lassitude. HEENT:   No-  headaches, difficulty swallowing, tooth/dental problems, sore throat,       No-  sneezing, itching, ear ache,                             + nasal congestion, post nasal drip,  CV:  No-   chest pain, orthopnea, PND, swelling in lower extremities, anasarca, dizziness, palpitations Resp: No-   shortness of breath with exertion or at rest.              No-   productive cough,  No non-productive cough,  No-  coughing up of blood.              No-   change in color of mucus.  + wheezing.   Skin: No-   rash or lesions. GI:  No-   heartburn, indigestion, abdominal pain, nausea, vomiting, diarrhea,                 change in bowel habits, loss of appetite GU: No-   dysuria, change in color of urine, no urgency or frequency.  No- flank pain. MS:  No-   joint pain or swelling.  No- decreased range of motion.  No- back pain. Neuro- grossly normal to observation, Or:  Psych:  No- change in mood or affect. No depression or anxiety.  No memory loss.      Objective:   Physical Exam  General- Alert, Oriented, Affect-appropriate, Distress- none acute Skin- rash-none, lesions- none, excoriation- none Lymphadenopathy- none Head- atraumatic            Eyes- Gross vision intact, PERRLA, conjunctivae clear secretions  Ears- Hearing, canals- Very hard of hearing chronically            Nose- Clear, Septal dev, mucus, polyps, erosion, perforation             Throat- Mallampati II , mucosa clear , drainage- none, tonsils- atrophic Neck- flexible , trachea midline, no stridor , thyroid nl, carotid no bruit Chest - symmetrical excursion , unlabored           Heart/CV- RRR , no murmur , no gallop  , no rub, nl s1 s2                           - JVD- none , edema- none, stasis changes- none, varices- none           Lung- +ry cough, wheeze,                      dullness-none, rub- none           Chest wall-  Abd- tender-no, distended-no,  bowel sounds-present, HSM- no Br/ Gen/ Rectal- Not done, not indicated Extrem- cyanosis- none, clubbing, none, atrophy- none, strength- nl Neuro- grossly intact to observation       Assessment & Plan:

## 2010-10-15 NOTE — Assessment & Plan Note (Signed)
We emphasized saline lavage and her Nasonex as best therapy.

## 2010-10-15 NOTE — Assessment & Plan Note (Signed)
Chronic intermittent asthmatic bronchitis. Dry air or some other aspect of the airplane trips to [place] keeps making her sick.

## 2010-11-13 ENCOUNTER — Ambulatory Visit (INDEPENDENT_AMBULATORY_CARE_PROVIDER_SITE_OTHER): Payer: Medicare Other | Admitting: Internal Medicine

## 2010-11-13 ENCOUNTER — Encounter: Payer: Self-pay | Admitting: Internal Medicine

## 2010-11-13 VITALS — BP 126/80 | HR 111 | Ht 65.0 in | Wt 179.6 lb

## 2010-11-13 DIAGNOSIS — J309 Allergic rhinitis, unspecified: Secondary | ICD-10-CM

## 2010-11-13 DIAGNOSIS — H6980 Other specified disorders of Eustachian tube, unspecified ear: Secondary | ICD-10-CM

## 2010-11-13 DIAGNOSIS — K219 Gastro-esophageal reflux disease without esophagitis: Secondary | ICD-10-CM

## 2010-11-13 NOTE — Patient Instructions (Signed)
Follow up in 6 months. Please call sooner if needed   Consider elevating the head of your bed the height of one brick.  Flu Vax

## 2010-11-17 ENCOUNTER — Encounter: Payer: Self-pay | Admitting: Internal Medicine

## 2010-11-17 NOTE — Assessment & Plan Note (Signed)
To continue allergy vaccine. Risk-benefit and choices reviewed.

## 2010-11-17 NOTE — Progress Notes (Signed)
Subjective:    Patient ID: Erica Drake, female    DOB: Oct 01, 1935, 75 y.o.   MRN: [id number]  HPI    Review of Systems     Objective:   Physical Exam        Assessment & Plan:   Subjective:    Patient ID: Erica Drake, female    DOB: Mar 19, 1935, 75 y.o.   MRN: [id number]  HPI 10/12/10- Asthma Last here -March 13, 2010-  Usually expects flare with seasonal pollens spring and Fall. Airflight is a trigger . Now for 3 weeks after latest filight to [place] she has wheezed.  Chest tight. She had been out of meds and called August 8 for refills of Proair and Spiriva. Has not used rescue inhaler, but is using Nasonex and Spiriva.  We discussed asthma as best diagnosis for her. Denies any head or nasal congestion or any reflux/ heartburn  11/13/2010- 74 yoF former smoker followed for asthma, allergic rhinitis, bronchitis, eustachian tube dysfunction, complicated by GERD, impaired hearing, fibromyalgia, insomnia She doesn't feel much different from her last visit. Persistent postnasal drainage. Lying supine at night there is a substernal pressure which has been present for months, some days worse than others. Her rescue inhaler may relieve this a little. It is also relieved by sitting upright. She sleeps on 2 pillows. We reviewed her history of fundoplication for GERD with the suggestion that she may be feeling mechanical upper pressure from the abdomen as she lies down. She is noting a pressure sensation in her ears with itching, some cough and sneeze.  Review of Systems Constitutional:   No-   weight loss, night sweats, fevers, chills, fatigue, lassitude. HEENT:   No-  headaches, difficulty swallowing, tooth/dental problems, sore throat,       +  sneezing, itching, ear ache,                             + nasal congestion, post nasal drip,  CV:  No-   chest pain, +orthopnea, no- PND, swelling in lower extremities, anasarca, dizziness, palpitations Resp: No-   shortness of  breath with exertion or at rest.              No-   productive cough,  + non-productive cough,  No-  coughing up of blood.              No-   change in color of mucus.  + wheezing.   Skin: No-   rash or lesions. GI:  No-   heartburn, indigestion, abdominal pain, nausea, vomiting, diarrhea,                 change in bowel habits, loss of appetite GU: No-   dysuria, change in color of urine, no urgency or frequency.  No- flank pain. MS:  No-   joint pain or swelling.  No- decreased range of motion.  No- back pain. Neuro- grossly normal to observation, Or:  Psych:  No- change in mood or affect. No depression or anxiety.  No memory loss.      Objective:   Physical Exam  General- Alert, Oriented, Affect-appropriate, Distress- none acute   she is lipreading. Skin- rash-none, lesions- none, excoriation- none Lymphadenopathy- none Head- atraumatic            Eyes- Gross vision intact, PERRLA, conjunctivae clear secretions            Ears- Hearing,  canals- Very hard of hearing chronically            Nose- Clear, no-Septal dev, mucus, polyps, erosion, perforation             Throat- Mallampati II , mucosa clear , drainage- none, tonsils- atrophic Neck- flexible , trachea midline, no stridor , thyroid nl, carotid no bruit Chest - symmetrical excursion , unlabored           Heart/CV- RRR , no murmur , no gallop  , no rub, nl s1 s2                           - JVD- none , edema- none, stasis changes- none, varices- none           Lung- +ry cough, wheeze,                      dullness-none, rub- none           Chest wall-  Abd- tender-no, distended-no, bowel sounds-present, HSM- no Br/ Gen/ Rectal- Not done, not indicated Extrem- cyanosis- none, clubbing, none, atrophy- none, strength- nl Neuro- grossly intact to observation       Assessment & Plan:

## 2010-11-17 NOTE — Assessment & Plan Note (Signed)
I think some kind of mechanical pressure from her abdomen up towards the esophagus best explains the positional substernal discomfort although she does not sense active reflux. Consider trying a hospital bed.

## 2010-11-17 NOTE — Assessment & Plan Note (Signed)
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 Management is mostly by her ENT physicians.

## 2011-03-13 ENCOUNTER — Ambulatory Visit: Payer: Self-pay | Admitting: Internal Medicine

## 2011-03-20 DIAGNOSIS — N3010 Interstitial cystitis (chronic) without hematuria: Secondary | ICD-10-CM | POA: Diagnosis not present

## 2011-03-21 DIAGNOSIS — F341 Dysthymic disorder: Secondary | ICD-10-CM | POA: Diagnosis not present

## 2011-04-11 DIAGNOSIS — K589 Irritable bowel syndrome without diarrhea: Secondary | ICD-10-CM | POA: Diagnosis not present

## 2011-04-11 DIAGNOSIS — IMO0001 Reserved for inherently not codable concepts without codable children: Secondary | ICD-10-CM | POA: Diagnosis not present

## 2011-04-11 DIAGNOSIS — Z86010 Personal history of colonic polyps: Secondary | ICD-10-CM | POA: Diagnosis not present

## 2011-04-11 DIAGNOSIS — K5902 Outlet dysfunction constipation: Secondary | ICD-10-CM | POA: Diagnosis not present

## 2011-04-11 DIAGNOSIS — N3010 Interstitial cystitis (chronic) without hematuria: Secondary | ICD-10-CM | POA: Diagnosis not present

## 2011-05-10 DIAGNOSIS — N3010 Interstitial cystitis (chronic) without hematuria: Secondary | ICD-10-CM | POA: Diagnosis not present

## 2011-05-15 ENCOUNTER — Ambulatory Visit: Payer: Medicare Other | Admitting: Internal Medicine

## 2011-05-18 DIAGNOSIS — N3010 Interstitial cystitis (chronic) without hematuria: Secondary | ICD-10-CM | POA: Diagnosis not present

## 2011-05-24 ENCOUNTER — Other Ambulatory Visit: Payer: Self-pay | Admitting: Internal Medicine

## 2011-05-24 DIAGNOSIS — Z1231 Encounter for screening mammogram for malignant neoplasm of breast: Secondary | ICD-10-CM

## 2011-05-29 DIAGNOSIS — E119 Type 2 diabetes mellitus without complications: Secondary | ICD-10-CM | POA: Diagnosis not present

## 2011-05-29 DIAGNOSIS — E785 Hyperlipidemia, unspecified: Secondary | ICD-10-CM | POA: Diagnosis not present

## 2011-05-29 DIAGNOSIS — E559 Vitamin D deficiency, unspecified: Secondary | ICD-10-CM | POA: Diagnosis not present

## 2011-05-29 DIAGNOSIS — N183 Chronic kidney disease, stage 3 unspecified: Secondary | ICD-10-CM | POA: Diagnosis not present

## 2011-05-29 DIAGNOSIS — I10 Essential (primary) hypertension: Secondary | ICD-10-CM | POA: Diagnosis not present

## 2011-05-29 DIAGNOSIS — Z0000 Encounter for general adult medical examination without abnormal findings: Secondary | ICD-10-CM | POA: Diagnosis not present

## 2011-06-01 ENCOUNTER — Ambulatory Visit: Payer: Medicare Other

## 2011-06-06 ENCOUNTER — Ambulatory Visit: Payer: Medicare Other

## 2011-06-08 DIAGNOSIS — J209 Acute bronchitis, unspecified: Secondary | ICD-10-CM | POA: Diagnosis not present

## 2011-06-14 ENCOUNTER — Ambulatory Visit: Payer: Medicare Other

## 2011-06-14 DIAGNOSIS — N3010 Interstitial cystitis (chronic) without hematuria: Secondary | ICD-10-CM | POA: Diagnosis not present

## 2011-06-21 ENCOUNTER — Ambulatory Visit
Admission: RE | Admit: 2011-06-21 | Discharge: 2011-06-21 | Disposition: A | Discharge status: Home / Self Care | Payer: Medicare Other | Source: Ambulatory Visit | Attending: Internal Medicine | Admitting: Internal Medicine

## 2011-06-21 DIAGNOSIS — Z1231 Encounter for screening mammogram for malignant neoplasm of breast: Secondary | ICD-10-CM | POA: Diagnosis not present

## 2011-06-22 ENCOUNTER — Ambulatory Visit: Payer: Medicare Other

## 2011-08-02 DIAGNOSIS — N3010 Interstitial cystitis (chronic) without hematuria: Secondary | ICD-10-CM | POA: Diagnosis not present

## 2011-08-09 DIAGNOSIS — H60399 Other infective otitis externa, unspecified ear: Secondary | ICD-10-CM | POA: Diagnosis not present

## 2011-08-15 DIAGNOSIS — N949 Unspecified condition associated with female genital organs and menstrual cycle: Secondary | ICD-10-CM | POA: Diagnosis not present

## 2011-08-15 DIAGNOSIS — R82998 Other abnormal findings in urine: Secondary | ICD-10-CM | POA: Diagnosis not present

## 2011-08-15 DIAGNOSIS — N3010 Interstitial cystitis (chronic) without hematuria: Secondary | ICD-10-CM | POA: Diagnosis not present

## 2011-08-20 DIAGNOSIS — K219 Gastro-esophageal reflux disease without esophagitis: Secondary | ICD-10-CM | POA: Diagnosis not present

## 2011-08-20 DIAGNOSIS — N3010 Interstitial cystitis (chronic) without hematuria: Secondary | ICD-10-CM | POA: Diagnosis not present

## 2011-08-20 DIAGNOSIS — R5383 Other fatigue: Secondary | ICD-10-CM | POA: Diagnosis not present

## 2011-08-20 DIAGNOSIS — E559 Vitamin D deficiency, unspecified: Secondary | ICD-10-CM | POA: Diagnosis not present

## 2011-08-20 DIAGNOSIS — R5381 Other malaise: Secondary | ICD-10-CM | POA: Diagnosis not present

## 2011-08-20 DIAGNOSIS — IMO0001 Reserved for inherently not codable concepts without codable children: Secondary | ICD-10-CM | POA: Diagnosis not present

## 2011-08-21 DIAGNOSIS — F341 Dysthymic disorder: Secondary | ICD-10-CM | POA: Diagnosis not present

## 2011-08-24 DIAGNOSIS — N3010 Interstitial cystitis (chronic) without hematuria: Secondary | ICD-10-CM | POA: Diagnosis not present

## 2011-08-31 DIAGNOSIS — M5137 Other intervertebral disc degeneration, lumbosacral region: Secondary | ICD-10-CM | POA: Diagnosis not present

## 2011-08-31 DIAGNOSIS — R82998 Other abnormal findings in urine: Secondary | ICD-10-CM | POA: Diagnosis not present

## 2011-08-31 DIAGNOSIS — N3010 Interstitial cystitis (chronic) without hematuria: Secondary | ICD-10-CM | POA: Diagnosis not present

## 2011-09-07 DIAGNOSIS — N3010 Interstitial cystitis (chronic) without hematuria: Secondary | ICD-10-CM | POA: Diagnosis not present

## 2011-09-11 DIAGNOSIS — M5137 Other intervertebral disc degeneration, lumbosacral region: Secondary | ICD-10-CM | POA: Diagnosis not present

## 2011-09-14 DIAGNOSIS — N3010 Interstitial cystitis (chronic) without hematuria: Secondary | ICD-10-CM | POA: Diagnosis not present

## 2011-09-21 DIAGNOSIS — N3010 Interstitial cystitis (chronic) without hematuria: Secondary | ICD-10-CM | POA: Diagnosis not present

## 2011-09-25 DIAGNOSIS — M5137 Other intervertebral disc degeneration, lumbosacral region: Secondary | ICD-10-CM | POA: Diagnosis not present

## 2011-09-27 DIAGNOSIS — N3010 Interstitial cystitis (chronic) without hematuria: Secondary | ICD-10-CM | POA: Diagnosis not present

## 2011-10-01 DIAGNOSIS — R319 Hematuria, unspecified: Secondary | ICD-10-CM | POA: Diagnosis not present

## 2011-10-01 DIAGNOSIS — N3010 Interstitial cystitis (chronic) without hematuria: Secondary | ICD-10-CM | POA: Insufficient documentation

## 2011-10-01 DIAGNOSIS — R310 Gross hematuria: Secondary | ICD-10-CM | POA: Diagnosis not present

## 2011-10-01 DIAGNOSIS — N3020 Other chronic cystitis without hematuria: Secondary | ICD-10-CM | POA: Diagnosis not present

## 2011-10-05 DIAGNOSIS — L719 Rosacea, unspecified: Secondary | ICD-10-CM | POA: Diagnosis not present

## 2011-10-05 DIAGNOSIS — L821 Other seborrheic keratosis: Secondary | ICD-10-CM | POA: Diagnosis not present

## 2011-11-01 DIAGNOSIS — R319 Hematuria, unspecified: Secondary | ICD-10-CM | POA: Diagnosis not present

## 2011-11-01 DIAGNOSIS — N3010 Interstitial cystitis (chronic) without hematuria: Secondary | ICD-10-CM | POA: Diagnosis not present

## 2011-11-01 DIAGNOSIS — R310 Gross hematuria: Secondary | ICD-10-CM | POA: Diagnosis not present

## 2011-11-01 DIAGNOSIS — Q619 Cystic kidney disease, unspecified: Secondary | ICD-10-CM | POA: Diagnosis not present

## 2011-11-01 DIAGNOSIS — N281 Cyst of kidney, acquired: Secondary | ICD-10-CM | POA: Diagnosis not present

## 2011-11-07 DIAGNOSIS — N3010 Interstitial cystitis (chronic) without hematuria: Secondary | ICD-10-CM | POA: Diagnosis not present

## 2011-11-07 DIAGNOSIS — R5382 Chronic fatigue, unspecified: Secondary | ICD-10-CM | POA: Diagnosis not present

## 2011-11-07 DIAGNOSIS — G9332 Myalgic encephalomyelitis/chronic fatigue syndrome: Secondary | ICD-10-CM | POA: Diagnosis not present

## 2011-11-07 DIAGNOSIS — D126 Benign neoplasm of colon, unspecified: Secondary | ICD-10-CM | POA: Diagnosis not present

## 2011-11-07 DIAGNOSIS — K219 Gastro-esophageal reflux disease without esophagitis: Secondary | ICD-10-CM | POA: Diagnosis not present

## 2011-11-07 DIAGNOSIS — IMO0001 Reserved for inherently not codable concepts without codable children: Secondary | ICD-10-CM | POA: Diagnosis not present

## 2011-11-07 DIAGNOSIS — K589 Irritable bowel syndrome without diarrhea: Secondary | ICD-10-CM | POA: Diagnosis not present

## 2011-11-07 DIAGNOSIS — K5902 Outlet dysfunction constipation: Secondary | ICD-10-CM | POA: Diagnosis not present

## 2011-11-26 DIAGNOSIS — Z961 Presence of intraocular lens: Secondary | ICD-10-CM | POA: Diagnosis not present

## 2011-11-26 DIAGNOSIS — E119 Type 2 diabetes mellitus without complications: Secondary | ICD-10-CM | POA: Diagnosis not present

## 2011-11-26 DIAGNOSIS — H43819 Vitreous degeneration, unspecified eye: Secondary | ICD-10-CM | POA: Diagnosis not present

## 2011-11-26 DIAGNOSIS — H52209 Unspecified astigmatism, unspecified eye: Secondary | ICD-10-CM | POA: Diagnosis not present

## 2011-11-27 DIAGNOSIS — F341 Dysthymic disorder: Secondary | ICD-10-CM | POA: Diagnosis not present

## 2012-01-29 DIAGNOSIS — K219 Gastro-esophageal reflux disease without esophagitis: Secondary | ICD-10-CM | POA: Diagnosis not present

## 2012-01-29 DIAGNOSIS — I10 Essential (primary) hypertension: Secondary | ICD-10-CM | POA: Diagnosis not present

## 2012-01-29 DIAGNOSIS — E119 Type 2 diabetes mellitus without complications: Secondary | ICD-10-CM | POA: Diagnosis not present

## 2012-01-29 DIAGNOSIS — R404 Transient alteration of awareness: Secondary | ICD-10-CM | POA: Diagnosis not present

## 2012-01-29 DIAGNOSIS — N183 Chronic kidney disease, stage 3 unspecified: Secondary | ICD-10-CM | POA: Diagnosis not present

## 2012-01-29 DIAGNOSIS — IMO0001 Reserved for inherently not codable concepts without codable children: Secondary | ICD-10-CM | POA: Diagnosis not present

## 2012-01-29 DIAGNOSIS — Z23 Encounter for immunization: Secondary | ICD-10-CM | POA: Diagnosis not present

## 2012-02-13 DIAGNOSIS — N3010 Interstitial cystitis (chronic) without hematuria: Secondary | ICD-10-CM | POA: Diagnosis not present

## 2012-02-26 DIAGNOSIS — N3010 Interstitial cystitis (chronic) without hematuria: Secondary | ICD-10-CM | POA: Diagnosis not present

## 2012-03-04 DIAGNOSIS — I10 Essential (primary) hypertension: Secondary | ICD-10-CM | POA: Diagnosis not present

## 2012-03-04 DIAGNOSIS — R404 Transient alteration of awareness: Secondary | ICD-10-CM | POA: Diagnosis not present

## 2012-03-06 DIAGNOSIS — N3010 Interstitial cystitis (chronic) without hematuria: Secondary | ICD-10-CM | POA: Diagnosis not present

## 2012-03-14 DIAGNOSIS — N3010 Interstitial cystitis (chronic) without hematuria: Secondary | ICD-10-CM | POA: Diagnosis not present

## 2012-03-20 DIAGNOSIS — F341 Dysthymic disorder: Secondary | ICD-10-CM | POA: Diagnosis not present

## 2012-03-21 DIAGNOSIS — N3010 Interstitial cystitis (chronic) without hematuria: Secondary | ICD-10-CM | POA: Diagnosis not present

## 2012-05-05 DIAGNOSIS — N3010 Interstitial cystitis (chronic) without hematuria: Secondary | ICD-10-CM | POA: Diagnosis not present

## 2012-05-16 DIAGNOSIS — N3010 Interstitial cystitis (chronic) without hematuria: Secondary | ICD-10-CM | POA: Diagnosis not present

## 2012-06-05 DIAGNOSIS — N3010 Interstitial cystitis (chronic) without hematuria: Secondary | ICD-10-CM | POA: Diagnosis not present

## 2012-06-12 DIAGNOSIS — N3010 Interstitial cystitis (chronic) without hematuria: Secondary | ICD-10-CM | POA: Diagnosis not present

## 2012-06-27 DIAGNOSIS — N3010 Interstitial cystitis (chronic) without hematuria: Secondary | ICD-10-CM | POA: Diagnosis not present

## 2012-07-07 DIAGNOSIS — N3010 Interstitial cystitis (chronic) without hematuria: Secondary | ICD-10-CM | POA: Diagnosis not present

## 2012-07-15 DIAGNOSIS — N3010 Interstitial cystitis (chronic) without hematuria: Secondary | ICD-10-CM | POA: Diagnosis not present

## 2012-07-18 DIAGNOSIS — F341 Dysthymic disorder: Secondary | ICD-10-CM | POA: Diagnosis not present

## 2012-07-23 DIAGNOSIS — N3010 Interstitial cystitis (chronic) without hematuria: Secondary | ICD-10-CM | POA: Diagnosis not present

## 2012-08-01 DIAGNOSIS — N3010 Interstitial cystitis (chronic) without hematuria: Secondary | ICD-10-CM | POA: Diagnosis not present

## 2012-08-04 DIAGNOSIS — N183 Chronic kidney disease, stage 3 unspecified: Secondary | ICD-10-CM | POA: Diagnosis not present

## 2012-08-04 DIAGNOSIS — E119 Type 2 diabetes mellitus without complications: Secondary | ICD-10-CM | POA: Diagnosis not present

## 2012-08-04 DIAGNOSIS — E785 Hyperlipidemia, unspecified: Secondary | ICD-10-CM | POA: Diagnosis not present

## 2012-08-04 DIAGNOSIS — I10 Essential (primary) hypertension: Secondary | ICD-10-CM | POA: Diagnosis not present

## 2012-08-04 DIAGNOSIS — K219 Gastro-esophageal reflux disease without esophagitis: Secondary | ICD-10-CM | POA: Diagnosis not present

## 2012-08-08 DIAGNOSIS — N3010 Interstitial cystitis (chronic) without hematuria: Secondary | ICD-10-CM | POA: Diagnosis not present

## 2012-08-14 DIAGNOSIS — N3010 Interstitial cystitis (chronic) without hematuria: Secondary | ICD-10-CM | POA: Diagnosis not present

## 2012-08-21 DIAGNOSIS — N3010 Interstitial cystitis (chronic) without hematuria: Secondary | ICD-10-CM | POA: Diagnosis not present

## 2012-08-28 DIAGNOSIS — N3010 Interstitial cystitis (chronic) without hematuria: Secondary | ICD-10-CM | POA: Diagnosis not present

## 2012-09-04 DIAGNOSIS — F341 Dysthymic disorder: Secondary | ICD-10-CM | POA: Diagnosis not present

## 2012-09-05 DIAGNOSIS — N3010 Interstitial cystitis (chronic) without hematuria: Secondary | ICD-10-CM | POA: Diagnosis not present

## 2012-09-18 DIAGNOSIS — N3010 Interstitial cystitis (chronic) without hematuria: Secondary | ICD-10-CM | POA: Diagnosis not present

## 2012-09-25 DIAGNOSIS — N3010 Interstitial cystitis (chronic) without hematuria: Secondary | ICD-10-CM | POA: Diagnosis not present

## 2012-10-02 DIAGNOSIS — N3010 Interstitial cystitis (chronic) without hematuria: Secondary | ICD-10-CM | POA: Diagnosis not present

## 2012-10-09 DIAGNOSIS — N3010 Interstitial cystitis (chronic) without hematuria: Secondary | ICD-10-CM | POA: Diagnosis not present

## 2012-10-31 DIAGNOSIS — N3010 Interstitial cystitis (chronic) without hematuria: Secondary | ICD-10-CM | POA: Diagnosis not present

## 2012-11-21 DIAGNOSIS — N3010 Interstitial cystitis (chronic) without hematuria: Secondary | ICD-10-CM | POA: Diagnosis not present

## 2012-11-25 DIAGNOSIS — Z0000 Encounter for general adult medical examination without abnormal findings: Secondary | ICD-10-CM | POA: Diagnosis not present

## 2012-11-26 DIAGNOSIS — E119 Type 2 diabetes mellitus without complications: Secondary | ICD-10-CM | POA: Diagnosis not present

## 2012-11-26 DIAGNOSIS — H5210 Myopia, unspecified eye: Secondary | ICD-10-CM | POA: Diagnosis not present

## 2012-11-26 DIAGNOSIS — H43819 Vitreous degeneration, unspecified eye: Secondary | ICD-10-CM | POA: Diagnosis not present

## 2012-11-26 DIAGNOSIS — Z961 Presence of intraocular lens: Secondary | ICD-10-CM | POA: Diagnosis not present

## 2012-11-28 DIAGNOSIS — N3010 Interstitial cystitis (chronic) without hematuria: Secondary | ICD-10-CM | POA: Diagnosis not present

## 2012-12-01 DIAGNOSIS — J069 Acute upper respiratory infection, unspecified: Secondary | ICD-10-CM | POA: Diagnosis not present

## 2012-12-04 DIAGNOSIS — F341 Dysthymic disorder: Secondary | ICD-10-CM | POA: Diagnosis not present

## 2012-12-12 DIAGNOSIS — N3010 Interstitial cystitis (chronic) without hematuria: Secondary | ICD-10-CM | POA: Diagnosis not present

## 2012-12-16 ENCOUNTER — Other Ambulatory Visit (HOSPITAL_COMMUNITY)
Admission: RE | Admit: 2012-12-16 | Discharge: 2012-12-16 | Disposition: A | Discharge status: Home / Self Care | Payer: Medicare Other | Source: Ambulatory Visit | Attending: Obstetrics and Gynecology | Admitting: Obstetrics and Gynecology

## 2012-12-16 ENCOUNTER — Other Ambulatory Visit (HOSPITAL_COMMUNITY)
Admission: RE | Admit: 2012-12-16 | Discharge: 2012-12-16 | Disposition: A | Discharge status: Home / Self Care | Payer: Medicare Other | Source: Ambulatory Visit | Attending: Nurse Practitioner | Admitting: Nurse Practitioner

## 2012-12-16 ENCOUNTER — Other Ambulatory Visit: Payer: Self-pay | Admitting: Nurse Practitioner

## 2012-12-16 DIAGNOSIS — Z1151 Encounter for screening for human papillomavirus (HPV): Secondary | ICD-10-CM | POA: Insufficient documentation

## 2012-12-16 DIAGNOSIS — Z01419 Encounter for gynecological examination (general) (routine) without abnormal findings: Secondary | ICD-10-CM | POA: Diagnosis not present

## 2012-12-16 DIAGNOSIS — Z124 Encounter for screening for malignant neoplasm of cervix: Secondary | ICD-10-CM | POA: Diagnosis not present

## 2012-12-16 DIAGNOSIS — N816 Rectocele: Secondary | ICD-10-CM | POA: Diagnosis not present

## 2012-12-19 DIAGNOSIS — N3010 Interstitial cystitis (chronic) without hematuria: Secondary | ICD-10-CM | POA: Diagnosis not present

## 2012-12-23 DIAGNOSIS — K5902 Outlet dysfunction constipation: Secondary | ICD-10-CM | POA: Diagnosis not present

## 2012-12-23 DIAGNOSIS — K6289 Other specified diseases of anus and rectum: Secondary | ICD-10-CM | POA: Diagnosis not present

## 2012-12-23 DIAGNOSIS — K589 Irritable bowel syndrome without diarrhea: Secondary | ICD-10-CM | POA: Diagnosis not present

## 2012-12-26 DIAGNOSIS — N3010 Interstitial cystitis (chronic) without hematuria: Secondary | ICD-10-CM | POA: Diagnosis not present

## 2012-12-26 DIAGNOSIS — Z23 Encounter for immunization: Secondary | ICD-10-CM | POA: Diagnosis not present

## 2013-01-09 DIAGNOSIS — N3010 Interstitial cystitis (chronic) without hematuria: Secondary | ICD-10-CM | POA: Diagnosis not present

## 2013-01-28 DIAGNOSIS — M19049 Primary osteoarthritis, unspecified hand: Secondary | ICD-10-CM | POA: Diagnosis not present

## 2013-01-28 DIAGNOSIS — N3010 Interstitial cystitis (chronic) without hematuria: Secondary | ICD-10-CM | POA: Diagnosis not present

## 2013-01-28 DIAGNOSIS — M6530 Trigger finger, unspecified finger: Secondary | ICD-10-CM | POA: Diagnosis not present

## 2013-01-28 DIAGNOSIS — M6740 Ganglion, unspecified site: Secondary | ICD-10-CM | POA: Diagnosis not present

## 2013-02-05 DIAGNOSIS — I10 Essential (primary) hypertension: Secondary | ICD-10-CM | POA: Diagnosis not present

## 2013-02-12 ENCOUNTER — Ambulatory Visit
Admission: RE | Admit: 2013-02-12 | Discharge: 2013-02-12 | Disposition: A | Discharge status: Home / Self Care | Payer: Medicare Other | Source: Ambulatory Visit

## 2013-02-12 ENCOUNTER — Other Ambulatory Visit: Payer: Self-pay

## 2013-02-12 ENCOUNTER — Other Ambulatory Visit: Payer: Self-pay | Admitting: Gastroenterology

## 2013-02-12 DIAGNOSIS — K3189 Other diseases of stomach and duodenum: Secondary | ICD-10-CM | POA: Diagnosis not present

## 2013-02-12 DIAGNOSIS — Z1231 Encounter for screening mammogram for malignant neoplasm of breast: Secondary | ICD-10-CM

## 2013-02-12 DIAGNOSIS — R1013 Epigastric pain: Secondary | ICD-10-CM

## 2013-02-13 DIAGNOSIS — N3010 Interstitial cystitis (chronic) without hematuria: Secondary | ICD-10-CM | POA: Diagnosis not present

## 2013-02-16 ENCOUNTER — Ambulatory Visit
Admission: RE | Admit: 2013-02-16 | Discharge: 2013-02-16 | Disposition: A | Discharge status: Home / Self Care | Payer: Medicare Other | Source: Ambulatory Visit | Attending: Gastroenterology | Admitting: Gastroenterology

## 2013-02-16 DIAGNOSIS — R1013 Epigastric pain: Secondary | ICD-10-CM

## 2013-02-16 DIAGNOSIS — K3189 Other diseases of stomach and duodenum: Secondary | ICD-10-CM | POA: Diagnosis not present

## 2013-02-23 DIAGNOSIS — K449 Diaphragmatic hernia without obstruction or gangrene: Secondary | ICD-10-CM | POA: Diagnosis not present

## 2013-02-23 DIAGNOSIS — K3189 Other diseases of stomach and duodenum: Secondary | ICD-10-CM | POA: Diagnosis not present

## 2013-02-23 DIAGNOSIS — R1013 Epigastric pain: Secondary | ICD-10-CM | POA: Diagnosis not present

## 2013-02-23 DIAGNOSIS — D131 Benign neoplasm of stomach: Secondary | ICD-10-CM | POA: Diagnosis not present

## 2013-02-27 DIAGNOSIS — N3010 Interstitial cystitis (chronic) without hematuria: Secondary | ICD-10-CM | POA: Diagnosis not present

## 2013-03-05 DIAGNOSIS — F341 Dysthymic disorder: Secondary | ICD-10-CM | POA: Diagnosis not present

## 2013-03-06 DIAGNOSIS — N3010 Interstitial cystitis (chronic) without hematuria: Secondary | ICD-10-CM | POA: Diagnosis not present

## 2013-03-12 DIAGNOSIS — K219 Gastro-esophageal reflux disease without esophagitis: Secondary | ICD-10-CM | POA: Diagnosis not present

## 2013-03-12 DIAGNOSIS — N3010 Interstitial cystitis (chronic) without hematuria: Secondary | ICD-10-CM | POA: Diagnosis not present

## 2013-03-12 DIAGNOSIS — E119 Type 2 diabetes mellitus without complications: Secondary | ICD-10-CM | POA: Diagnosis not present

## 2013-03-12 DIAGNOSIS — D131 Benign neoplasm of stomach: Secondary | ICD-10-CM | POA: Diagnosis not present

## 2013-03-12 DIAGNOSIS — K3189 Other diseases of stomach and duodenum: Secondary | ICD-10-CM | POA: Diagnosis not present

## 2013-03-12 DIAGNOSIS — K589 Irritable bowel syndrome without diarrhea: Secondary | ICD-10-CM | POA: Diagnosis not present

## 2013-03-12 DIAGNOSIS — R5381 Other malaise: Secondary | ICD-10-CM | POA: Diagnosis not present

## 2013-03-13 DIAGNOSIS — N3010 Interstitial cystitis (chronic) without hematuria: Secondary | ICD-10-CM | POA: Diagnosis not present

## 2013-03-26 DIAGNOSIS — K589 Irritable bowel syndrome without diarrhea: Secondary | ICD-10-CM | POA: Diagnosis not present

## 2013-03-26 DIAGNOSIS — N3010 Interstitial cystitis (chronic) without hematuria: Secondary | ICD-10-CM | POA: Diagnosis not present

## 2013-03-26 DIAGNOSIS — R1013 Epigastric pain: Secondary | ICD-10-CM | POA: Diagnosis not present

## 2013-03-26 DIAGNOSIS — K219 Gastro-esophageal reflux disease without esophagitis: Secondary | ICD-10-CM | POA: Diagnosis not present

## 2013-03-26 DIAGNOSIS — N949 Unspecified condition associated with female genital organs and menstrual cycle: Secondary | ICD-10-CM | POA: Diagnosis not present

## 2013-03-26 DIAGNOSIS — K3189 Other diseases of stomach and duodenum: Secondary | ICD-10-CM | POA: Diagnosis not present

## 2013-04-03 DIAGNOSIS — N3010 Interstitial cystitis (chronic) without hematuria: Secondary | ICD-10-CM | POA: Diagnosis not present

## 2013-05-15 DIAGNOSIS — N3010 Interstitial cystitis (chronic) without hematuria: Secondary | ICD-10-CM | POA: Diagnosis not present

## 2013-07-13 DIAGNOSIS — N3010 Interstitial cystitis (chronic) without hematuria: Secondary | ICD-10-CM | POA: Diagnosis not present

## 2013-08-06 DIAGNOSIS — F341 Dysthymic disorder: Secondary | ICD-10-CM | POA: Diagnosis not present

## 2013-08-07 DIAGNOSIS — Z0000 Encounter for general adult medical examination without abnormal findings: Secondary | ICD-10-CM | POA: Diagnosis not present

## 2013-08-07 DIAGNOSIS — E119 Type 2 diabetes mellitus without complications: Secondary | ICD-10-CM | POA: Diagnosis not present

## 2013-08-07 DIAGNOSIS — N183 Chronic kidney disease, stage 3 unspecified: Secondary | ICD-10-CM | POA: Diagnosis not present

## 2013-08-07 DIAGNOSIS — K219 Gastro-esophageal reflux disease without esophagitis: Secondary | ICD-10-CM | POA: Diagnosis not present

## 2013-08-07 DIAGNOSIS — Z1331 Encounter for screening for depression: Secondary | ICD-10-CM | POA: Diagnosis not present

## 2013-08-07 DIAGNOSIS — I10 Essential (primary) hypertension: Secondary | ICD-10-CM | POA: Diagnosis not present

## 2013-08-07 DIAGNOSIS — Z23 Encounter for immunization: Secondary | ICD-10-CM | POA: Diagnosis not present

## 2013-08-20 DIAGNOSIS — K589 Irritable bowel syndrome without diarrhea: Secondary | ICD-10-CM | POA: Diagnosis not present

## 2013-08-20 DIAGNOSIS — N3010 Interstitial cystitis (chronic) without hematuria: Secondary | ICD-10-CM | POA: Diagnosis not present

## 2013-08-20 DIAGNOSIS — M549 Dorsalgia, unspecified: Secondary | ICD-10-CM | POA: Diagnosis not present

## 2013-08-20 DIAGNOSIS — K219 Gastro-esophageal reflux disease without esophagitis: Secondary | ICD-10-CM | POA: Diagnosis not present

## 2013-09-02 DIAGNOSIS — N3010 Interstitial cystitis (chronic) without hematuria: Secondary | ICD-10-CM | POA: Diagnosis not present

## 2013-09-14 DIAGNOSIS — R1013 Epigastric pain: Secondary | ICD-10-CM | POA: Diagnosis not present

## 2013-09-14 DIAGNOSIS — K5902 Outlet dysfunction constipation: Secondary | ICD-10-CM | POA: Diagnosis not present

## 2013-09-14 DIAGNOSIS — K589 Irritable bowel syndrome without diarrhea: Secondary | ICD-10-CM | POA: Diagnosis not present

## 2013-09-14 DIAGNOSIS — N3010 Interstitial cystitis (chronic) without hematuria: Secondary | ICD-10-CM | POA: Diagnosis not present

## 2013-09-14 DIAGNOSIS — L259 Unspecified contact dermatitis, unspecified cause: Secondary | ICD-10-CM | POA: Diagnosis not present

## 2013-09-14 DIAGNOSIS — K219 Gastro-esophageal reflux disease without esophagitis: Secondary | ICD-10-CM | POA: Diagnosis not present

## 2013-09-14 DIAGNOSIS — K3189 Other diseases of stomach and duodenum: Secondary | ICD-10-CM | POA: Diagnosis not present

## 2013-10-14 ENCOUNTER — Other Ambulatory Visit: Payer: Self-pay | Admitting: Gastroenterology

## 2013-10-14 DIAGNOSIS — IMO0001 Reserved for inherently not codable concepts without codable children: Secondary | ICD-10-CM | POA: Diagnosis not present

## 2013-10-14 DIAGNOSIS — K5902 Outlet dysfunction constipation: Secondary | ICD-10-CM | POA: Diagnosis not present

## 2013-10-14 DIAGNOSIS — R5382 Chronic fatigue, unspecified: Secondary | ICD-10-CM | POA: Diagnosis not present

## 2013-10-14 DIAGNOSIS — K219 Gastro-esophageal reflux disease without esophagitis: Secondary | ICD-10-CM | POA: Diagnosis not present

## 2013-10-14 DIAGNOSIS — K589 Irritable bowel syndrome without diarrhea: Secondary | ICD-10-CM | POA: Diagnosis not present

## 2013-10-14 DIAGNOSIS — R1013 Epigastric pain: Secondary | ICD-10-CM | POA: Diagnosis not present

## 2013-10-14 DIAGNOSIS — N3010 Interstitial cystitis (chronic) without hematuria: Secondary | ICD-10-CM | POA: Diagnosis not present

## 2013-10-14 DIAGNOSIS — G9332 Myalgic encephalomyelitis/chronic fatigue syndrome: Secondary | ICD-10-CM | POA: Diagnosis not present

## 2013-10-15 ENCOUNTER — Other Ambulatory Visit: Payer: Self-pay | Admitting: Gastroenterology

## 2013-10-15 DIAGNOSIS — R1013 Epigastric pain: Secondary | ICD-10-CM | POA: Diagnosis not present

## 2013-10-15 DIAGNOSIS — Z9889 Other specified postprocedural states: Secondary | ICD-10-CM | POA: Diagnosis not present

## 2013-10-15 DIAGNOSIS — D131 Benign neoplasm of stomach: Secondary | ICD-10-CM | POA: Diagnosis not present

## 2013-10-16 ENCOUNTER — Ambulatory Visit
Admission: RE | Admit: 2013-10-16 | Discharge: 2013-10-16 | Disposition: A | Discharge status: Home / Self Care | Payer: Medicare Other | Source: Ambulatory Visit | Attending: Gastroenterology | Admitting: Gastroenterology

## 2013-10-16 DIAGNOSIS — K5730 Diverticulosis of large intestine without perforation or abscess without bleeding: Secondary | ICD-10-CM | POA: Diagnosis not present

## 2013-10-16 DIAGNOSIS — R1013 Epigastric pain: Secondary | ICD-10-CM

## 2013-10-16 MED ORDER — IOHEXOL 300 MG/ML  SOLN
100.0000 mL | Freq: Once | INTRAMUSCULAR | Status: AC | PRN
  Administered 2013-10-16: 100 mL via INTRAVENOUS

## 2013-10-20 ENCOUNTER — Other Ambulatory Visit (HOSPITAL_COMMUNITY): Payer: Self-pay | Admitting: Gastroenterology

## 2013-10-20 DIAGNOSIS — R1013 Epigastric pain: Secondary | ICD-10-CM

## 2013-10-23 ENCOUNTER — Ambulatory Visit (HOSPITAL_COMMUNITY)
Admission: RE | Admit: 2013-10-23 | Discharge: 2013-10-23 | Disposition: A | Discharge status: Home / Self Care | Payer: Medicare Other | Source: Ambulatory Visit | Attending: Gastroenterology | Admitting: Gastroenterology

## 2013-10-23 DIAGNOSIS — R109 Unspecified abdominal pain: Secondary | ICD-10-CM | POA: Diagnosis not present

## 2013-10-23 DIAGNOSIS — R1013 Epigastric pain: Secondary | ICD-10-CM | POA: Diagnosis not present

## 2013-10-23 DIAGNOSIS — R110 Nausea: Secondary | ICD-10-CM | POA: Diagnosis not present

## 2013-10-23 MED ORDER — TECHNETIUM TC 99M SULFUR COLLOID
2.2000 | Freq: Once | INTRAVENOUS | Status: AC | PRN
  Administered 2013-10-23: 2.2 via INTRAVENOUS

## 2013-11-05 DIAGNOSIS — D131 Benign neoplasm of stomach: Secondary | ICD-10-CM | POA: Diagnosis not present

## 2013-11-05 DIAGNOSIS — R1013 Epigastric pain: Secondary | ICD-10-CM | POA: Diagnosis not present

## 2013-11-06 DIAGNOSIS — H10219 Acute toxic conjunctivitis, unspecified eye: Secondary | ICD-10-CM | POA: Diagnosis not present

## 2013-11-11 DIAGNOSIS — N343 Urethral syndrome, unspecified: Secondary | ICD-10-CM | POA: Diagnosis not present

## 2013-11-11 DIAGNOSIS — N3010 Interstitial cystitis (chronic) without hematuria: Secondary | ICD-10-CM | POA: Diagnosis not present

## 2013-11-17 DIAGNOSIS — M25569 Pain in unspecified knee: Secondary | ICD-10-CM | POA: Diagnosis not present

## 2013-11-26 DIAGNOSIS — H04123 Dry eye syndrome of bilateral lacrimal glands: Secondary | ICD-10-CM | POA: Diagnosis not present

## 2013-11-26 DIAGNOSIS — N3010 Interstitial cystitis (chronic) without hematuria: Secondary | ICD-10-CM | POA: Diagnosis not present

## 2013-11-26 DIAGNOSIS — E119 Type 2 diabetes mellitus without complications: Secondary | ICD-10-CM | POA: Diagnosis not present

## 2013-11-26 DIAGNOSIS — Z961 Presence of intraocular lens: Secondary | ICD-10-CM | POA: Diagnosis not present

## 2013-11-26 DIAGNOSIS — H43813 Vitreous degeneration, bilateral: Secondary | ICD-10-CM | POA: Diagnosis not present

## 2013-12-03 DIAGNOSIS — H9190 Unspecified hearing loss, unspecified ear: Secondary | ICD-10-CM | POA: Diagnosis not present

## 2013-12-03 DIAGNOSIS — N3010 Interstitial cystitis (chronic) without hematuria: Secondary | ICD-10-CM | POA: Diagnosis not present

## 2013-12-03 DIAGNOSIS — J302 Other seasonal allergic rhinitis: Secondary | ICD-10-CM | POA: Diagnosis not present

## 2013-12-10 DIAGNOSIS — N3010 Interstitial cystitis (chronic) without hematuria: Secondary | ICD-10-CM | POA: Diagnosis not present

## 2013-12-11 DIAGNOSIS — H903 Sensorineural hearing loss, bilateral: Secondary | ICD-10-CM | POA: Diagnosis not present

## 2013-12-18 DIAGNOSIS — N3010 Interstitial cystitis (chronic) without hematuria: Secondary | ICD-10-CM | POA: Diagnosis not present

## 2013-12-22 DIAGNOSIS — F341 Dysthymic disorder: Secondary | ICD-10-CM | POA: Diagnosis not present

## 2013-12-28 DIAGNOSIS — M5136 Other intervertebral disc degeneration, lumbar region: Secondary | ICD-10-CM | POA: Diagnosis not present

## 2013-12-28 DIAGNOSIS — K219 Gastro-esophageal reflux disease without esophagitis: Secondary | ICD-10-CM | POA: Diagnosis not present

## 2014-01-01 DIAGNOSIS — M5136 Other intervertebral disc degeneration, lumbar region: Secondary | ICD-10-CM | POA: Diagnosis not present

## 2014-01-01 DIAGNOSIS — M545 Low back pain: Secondary | ICD-10-CM | POA: Diagnosis not present

## 2014-01-01 DIAGNOSIS — M5440 Lumbago with sciatica, unspecified side: Secondary | ICD-10-CM | POA: Diagnosis not present

## 2014-01-01 DIAGNOSIS — M47816 Spondylosis without myelopathy or radiculopathy, lumbar region: Secondary | ICD-10-CM | POA: Diagnosis not present

## 2014-01-14 DIAGNOSIS — H903 Sensorineural hearing loss, bilateral: Secondary | ICD-10-CM | POA: Diagnosis not present

## 2014-01-18 DIAGNOSIS — N3010 Interstitial cystitis (chronic) without hematuria: Secondary | ICD-10-CM | POA: Diagnosis not present

## 2014-01-19 DIAGNOSIS — M5136 Other intervertebral disc degeneration, lumbar region: Secondary | ICD-10-CM | POA: Diagnosis not present

## 2014-01-19 DIAGNOSIS — M5441 Lumbago with sciatica, right side: Secondary | ICD-10-CM | POA: Diagnosis not present

## 2014-01-19 DIAGNOSIS — M5442 Lumbago with sciatica, left side: Secondary | ICD-10-CM | POA: Diagnosis not present

## 2014-01-27 DIAGNOSIS — K219 Gastro-esophageal reflux disease without esophagitis: Secondary | ICD-10-CM | POA: Diagnosis not present

## 2014-01-27 DIAGNOSIS — N3011 Interstitial cystitis (chronic) with hematuria: Secondary | ICD-10-CM | POA: Diagnosis not present

## 2014-01-27 DIAGNOSIS — K580 Irritable bowel syndrome with diarrhea: Secondary | ICD-10-CM | POA: Diagnosis not present

## 2014-01-29 DIAGNOSIS — M5441 Lumbago with sciatica, right side: Secondary | ICD-10-CM | POA: Diagnosis not present

## 2014-02-05 DIAGNOSIS — M5441 Lumbago with sciatica, right side: Secondary | ICD-10-CM | POA: Diagnosis not present

## 2014-02-05 DIAGNOSIS — M5442 Lumbago with sciatica, left side: Secondary | ICD-10-CM | POA: Diagnosis not present

## 2014-02-05 DIAGNOSIS — M5136 Other intervertebral disc degeneration, lumbar region: Secondary | ICD-10-CM | POA: Diagnosis not present

## 2014-02-08 ENCOUNTER — Other Ambulatory Visit: Payer: Self-pay

## 2014-02-08 DIAGNOSIS — Z1231 Encounter for screening mammogram for malignant neoplasm of breast: Secondary | ICD-10-CM

## 2014-02-09 DIAGNOSIS — J069 Acute upper respiratory infection, unspecified: Secondary | ICD-10-CM | POA: Diagnosis not present

## 2014-02-09 DIAGNOSIS — K219 Gastro-esophageal reflux disease without esophagitis: Secondary | ICD-10-CM | POA: Diagnosis not present

## 2014-02-09 DIAGNOSIS — I10 Essential (primary) hypertension: Secondary | ICD-10-CM | POA: Diagnosis not present

## 2014-02-09 DIAGNOSIS — E119 Type 2 diabetes mellitus without complications: Secondary | ICD-10-CM | POA: Diagnosis not present

## 2014-02-09 DIAGNOSIS — M5441 Lumbago with sciatica, right side: Secondary | ICD-10-CM | POA: Diagnosis not present

## 2014-02-09 DIAGNOSIS — M797 Fibromyalgia: Secondary | ICD-10-CM | POA: Diagnosis not present

## 2014-02-11 DIAGNOSIS — M5136 Other intervertebral disc degeneration, lumbar region: Secondary | ICD-10-CM | POA: Diagnosis not present

## 2014-02-11 DIAGNOSIS — M5441 Lumbago with sciatica, right side: Secondary | ICD-10-CM | POA: Diagnosis not present

## 2014-02-11 DIAGNOSIS — M5442 Lumbago with sciatica, left side: Secondary | ICD-10-CM | POA: Diagnosis not present

## 2014-02-16 DIAGNOSIS — N3010 Interstitial cystitis (chronic) without hematuria: Secondary | ICD-10-CM | POA: Diagnosis not present

## 2014-02-16 DIAGNOSIS — R102 Pelvic and perineal pain: Secondary | ICD-10-CM | POA: Diagnosis not present

## 2014-02-22 DIAGNOSIS — M5441 Lumbago with sciatica, right side: Secondary | ICD-10-CM | POA: Diagnosis not present

## 2014-02-22 DIAGNOSIS — M5442 Lumbago with sciatica, left side: Secondary | ICD-10-CM | POA: Diagnosis not present

## 2014-02-25 ENCOUNTER — Ambulatory Visit: Payer: Medicare Other

## 2014-03-01 DIAGNOSIS — F341 Dysthymic disorder: Secondary | ICD-10-CM | POA: Diagnosis not present

## 2014-03-03 DIAGNOSIS — N3010 Interstitial cystitis (chronic) without hematuria: Secondary | ICD-10-CM | POA: Diagnosis not present

## 2014-03-08 DIAGNOSIS — F341 Dysthymic disorder: Secondary | ICD-10-CM | POA: Diagnosis not present

## 2014-03-11 DIAGNOSIS — N3010 Interstitial cystitis (chronic) without hematuria: Secondary | ICD-10-CM | POA: Diagnosis not present

## 2014-03-16 ENCOUNTER — Ambulatory Visit: Payer: Medicare Other

## 2014-03-16 DIAGNOSIS — N343 Urethral syndrome, unspecified: Secondary | ICD-10-CM | POA: Diagnosis not present

## 2014-03-17 ENCOUNTER — Ambulatory Visit: Payer: Medicare Other

## 2014-03-25 ENCOUNTER — Ambulatory Visit (INDEPENDENT_AMBULATORY_CARE_PROVIDER_SITE_OTHER): Payer: Medicare Other

## 2014-03-25 ENCOUNTER — Telehealth: Payer: Self-pay | Admitting: *Deleted

## 2014-03-25 ENCOUNTER — Ambulatory Visit (INDEPENDENT_AMBULATORY_CARE_PROVIDER_SITE_OTHER): Payer: Medicare Other | Admitting: Podiatry

## 2014-03-25 DIAGNOSIS — M779 Enthesopathy, unspecified: Secondary | ICD-10-CM | POA: Diagnosis not present

## 2014-03-25 DIAGNOSIS — M8430XA Stress fracture, unspecified site, initial encounter for fracture: Secondary | ICD-10-CM | POA: Diagnosis not present

## 2014-03-25 NOTE — Telephone Encounter (Addendum)
Pt states she was seen in office today and given a boot, which she doesn't remember how to use.  I left a message encouraging pt to come in for a demonstration and we would be here until 400pm today.  I spoke with pt and she said she didn't feel the air was going into the boot.  I instructed the pt to turn the little knob clockwise until it stopped, then put her hand behindthe gray bubble and press the bubble 15 - 20 times, then to deflate turn the knob counter clockwise.  Pt stated,"So the knob turns, you don't press it."  I said she was correct and if she had any other questions after 400pm today, she should call tomorrow.   Pt agreed.

## 2014-03-25 NOTE — Progress Notes (Signed)
Subjective:     Patient ID: Erica Drake, female   DOB: 05-Sep-1935, 79 y.o.   MRN: [id number]  HPI patient presents stating I'm getting a lot of pain in my right forefoot over the last few weeks. I do think him allergic to cortisone and cannot take   Review of Systems  All other systems reviewed and are negative.      Objective:   Physical Exam  Constitutional: She is oriented to person, place, and time.  Cardiovascular: Intact distal pulses.   Musculoskeletal: Normal range of motion.  Neurological: She is oriented to person, place, and time.  Skin: Skin is warm and dry.  Nursing note and vitals reviewed.  neurovascular status intact with muscle strength adequate and range of motion within normal limits. Patient's noted to have good digital perfusion is well oriented 3 and is noted to have inflammation and fluid buildup around the third metatarsophalangeal joint right that's very painful when pressed with inability to walk comfortably and also mild to moderate discomfort within the metatarsal shaft     Assessment:     Inflammatory capsulitis right third MPJ along with possibility for stress fracture right dorsal foot    Plan:     H&P and x-rays reviewed with patient. We cannot use cortisone we do need to immobilize her today she is placed into a short air fracture walker with instructions on usage. Reappoint to recheck in 3 or 4 weeks

## 2014-03-25 NOTE — Progress Notes (Signed)
   Subjective:    Patient ID: Erica Drake, female    DOB: [birth year], 79 y.o.   MRN: [id number]  HPI Comments: "I have these plantar warts"  Patient c/o tender plantar forefoot right for several years off and on. She used to see dermatologist Dr. Lilli Light, that trimmed them down. She has some thin callused areas. She keeps padding around the 2nd MPJ but there is no callus there.      Review of Systems  Constitutional: Positive for fatigue.  HENT: Positive for hearing loss and tinnitus.   Genitourinary: Positive for urgency, frequency, hematuria and difficulty urinating.  Musculoskeletal: Positive for back pain and arthralgias.  All other systems reviewed and are negative.      Objective:   Physical Exam        Assessment & Plan:

## 2014-03-26 ENCOUNTER — Telehealth: Payer: Self-pay | Admitting: *Deleted

## 2014-03-26 DIAGNOSIS — N3010 Interstitial cystitis (chronic) without hematuria: Secondary | ICD-10-CM | POA: Diagnosis not present

## 2014-03-26 DIAGNOSIS — N343 Urethral syndrome, unspecified: Secondary | ICD-10-CM | POA: Diagnosis not present

## 2014-03-26 NOTE — Telephone Encounter (Signed)
Pt states her boot is hurting the area of her right foot plantar forefoot.  I checked the air fracture walker, the inflatable sock distal edge was crooked in the frame causing the edge to rub the MPJ areas, where she was tender initially.  I removed the air fracture walker, and removed the inflatable sock and repositioned it on the pt's foot without the frame, then I had pt to stand and step down into the frame and I reapplied the straps and refilled with air.  Pt stated it felt much better, applied this way as to the application on 64/84/7207.

## 2014-03-26 NOTE — Telephone Encounter (Addendum)
Pt called with the medications that were injected into her back, when she had the mouth swelling. Pt states 1cc Omnipaque, 1cc Dexamethasone.  Pt wanted to know if this was not the medication Dr. Paulla Dolly used could she be given an injection.  I told pt I would inform Dr. Paulla Dolly, and call again.  Pt states she would like to get copies of her X-rays to go to Dr. Doran Durand.  I told her I would make her copies available for her pick up today.  I also explainedthat Dr. Paulla Dolly had wanted her to see an Allergist, but he would not give her a cortisone injection with the type of reaction she had from her back injection.

## 2014-04-06 DIAGNOSIS — G5761 Lesion of plantar nerve, right lower limb: Secondary | ICD-10-CM | POA: Diagnosis not present

## 2014-04-06 DIAGNOSIS — M2041 Other hammer toe(s) (acquired), right foot: Secondary | ICD-10-CM | POA: Diagnosis not present

## 2014-04-07 DIAGNOSIS — N3010 Interstitial cystitis (chronic) without hematuria: Secondary | ICD-10-CM | POA: Diagnosis not present

## 2014-04-08 ENCOUNTER — Ambulatory Visit: Payer: Medicare Other

## 2014-04-13 ENCOUNTER — Ambulatory Visit
Admission: RE | Admit: 2014-04-13 | Discharge: 2014-04-13 | Disposition: A | Discharge status: Home / Self Care | Payer: Medicare Other | Source: Ambulatory Visit

## 2014-04-13 DIAGNOSIS — Z1231 Encounter for screening mammogram for malignant neoplasm of breast: Secondary | ICD-10-CM

## 2014-04-15 DIAGNOSIS — N3010 Interstitial cystitis (chronic) without hematuria: Secondary | ICD-10-CM | POA: Diagnosis not present

## 2014-04-15 DIAGNOSIS — F341 Dysthymic disorder: Secondary | ICD-10-CM | POA: Diagnosis not present

## 2014-04-22 DIAGNOSIS — N343 Urethral syndrome, unspecified: Secondary | ICD-10-CM | POA: Diagnosis not present

## 2014-04-22 DIAGNOSIS — N3010 Interstitial cystitis (chronic) without hematuria: Secondary | ICD-10-CM | POA: Diagnosis not present

## 2014-04-29 DIAGNOSIS — R102 Pelvic and perineal pain: Secondary | ICD-10-CM | POA: Diagnosis not present

## 2014-04-29 DIAGNOSIS — Z01419 Encounter for gynecological examination (general) (routine) without abnormal findings: Secondary | ICD-10-CM | POA: Diagnosis not present

## 2014-04-29 DIAGNOSIS — N3010 Interstitial cystitis (chronic) without hematuria: Secondary | ICD-10-CM | POA: Diagnosis not present

## 2014-05-05 DIAGNOSIS — N3010 Interstitial cystitis (chronic) without hematuria: Secondary | ICD-10-CM | POA: Diagnosis not present

## 2014-05-05 DIAGNOSIS — M62838 Other muscle spasm: Secondary | ICD-10-CM | POA: Diagnosis not present

## 2014-05-05 DIAGNOSIS — R278 Other lack of coordination: Secondary | ICD-10-CM | POA: Diagnosis not present

## 2014-05-05 DIAGNOSIS — M6281 Muscle weakness (generalized): Secondary | ICD-10-CM | POA: Diagnosis not present

## 2014-05-07 DIAGNOSIS — N3010 Interstitial cystitis (chronic) without hematuria: Secondary | ICD-10-CM | POA: Diagnosis not present

## 2014-05-10 ENCOUNTER — Encounter: Payer: Self-pay | Admitting: Internal Medicine

## 2014-05-10 ENCOUNTER — Ambulatory Visit (INDEPENDENT_AMBULATORY_CARE_PROVIDER_SITE_OTHER): Payer: Medicare Other | Admitting: Internal Medicine

## 2014-05-10 VITALS — BP 134/74 | HR 104 | Ht 65.0 in | Wt 153.0 lb

## 2014-05-10 DIAGNOSIS — Z23 Encounter for immunization: Secondary | ICD-10-CM

## 2014-05-10 DIAGNOSIS — J45909 Unspecified asthma, uncomplicated: Secondary | ICD-10-CM | POA: Diagnosis not present

## 2014-05-10 DIAGNOSIS — T783XXA Angioneurotic edema, initial encounter: Secondary | ICD-10-CM

## 2014-05-10 DIAGNOSIS — K219 Gastro-esophageal reflux disease without esophagitis: Secondary | ICD-10-CM | POA: Diagnosis not present

## 2014-05-10 MED ORDER — TIOTROPIUM BROMIDE MONOHYDRATE 2.5 MCG/ACT IN AERS: 2.0000 | INHALATION_SPRAY | Freq: Every day | RESPIRATORY_TRACT | Status: DC

## 2014-05-10 NOTE — Progress Notes (Signed)
Patient ID: Erica Drake, female DOB: [birth year], 79 y.o. MRN: [id number]  HPI 10/12/10- Asthma Last here -March 13, 2010-  Usually expects flare with seasonal pollens spring and Fall. Airflight is a trigger . Now for 3 weeks after latest filight to [place] she has wheezed.  Chest tight. She had been out of meds and called August 8 for refills of Proair and Spiriva. Has not used rescue inhaler, but is using Nasonex and Spiriva.  We discussed asthma as best diagnosis for her. Denies any head or nasal congestion or any reflux/ heartburn  11/13/2010- 79 yoF former smoker followed for asthma, allergic rhinitis, bronchitis, eustachian tube dysfunction, complicated by GERD, impaired hearing, fibromyalgia, insomnia She doesn't feel much different from her last visit. Persistent postnasal drainage. Lying supine at night there is a substernal pressure which has been present for months, some days worse than others. Her rescue inhaler may relieve this a little. It is also relieved by sitting upright. She sleeps on 2 pillows. We reviewed her history of fundoplication for GERD with the suggestion that she may be feeling mechanical upper pressure from the abdomen as she lies down. She is noting a pressure sensation in her ears with itching, some cough and sneeze.       05/10/14- 79 yoF former smoker last seen 2012, followed for asthma, allergic rhinitis, bronchitis, eustachian tube dysfunction, complicated by GERD, impaired hearing, fibromyalgia, insomnia  Coming to re-establish for allergies; She is specifically concerned about a possible reaction to cortisone injection given for back pain. She remembers prednisone once causing stomachache. Recent injection, presumably methylprednisolone, associated with malaise. On repeat she says tongue swollen for a day or more. History of GERD requiring Pepcid AC six daily.   Prior to Admission medications   Medication Sig Start Date End Date  Taking? Authorizing Provider  buPROPion (WELLBUTRIN SR) 200 MG 12 hr tablet Take 200 mg by mouth daily.     Yes Historical Provider, MD  clonazePAM (KLONOPIN) 2 MG tablet Take 2 mg by mouth 2 (two) times daily.    Yes Historical Provider, MD  Famotidine (PEPCID AC PO) Take by mouth.   Yes Historical Provider, MD  famotidine (PEPCID) 10 MG tablet Take 20 mg by mouth 2 (two) times daily.   Yes Historical Provider, MD  albuterol (PROAIR HFA) 108 (90 BASE) MCG/ACT inhaler Inhale 2 puffs into the lungs every 6 (six) hours as needed for wheezing. 10/04/10 10/04/11  Deneise Lever, MD  Tiotropium Bromide Monohydrate (SPIRIVA RESPIMAT) 2.5 MCG/ACT AERS Inhale 2 puffs into the lungs daily. 05/10/14   Deneise Lever, MD   Past Medical History  Diagnosis Date  . Chronic bronchitis   . Allergic rhinitis   . Decreased hearing     intol hearing aids  . Fibromyalgia   . Interstitial cystitis   . Chronic fatigue   . Eustachian tube dysfunction     decreased hearing  . GERD (gastroesophageal reflux disease)     Nissen   Past Surgical History  Procedure Laterality Date  . Nissen fundoplication     Family History  Problem Relation Age of Onset  . Asthma Mother     biologic  . Asthma Son    History   Social History  . Marital Status: Married    Spouse Name: N/A  . Number of Children: N/A  . Years of Education: N/A   Occupational History  . Not on file.   Social History Main Topics  .  Smoking status: Former Research scientist (life sciences)  . Smokeless tobacco: Not on file  . Alcohol Use: Not on file  . Drug Use: Not on file  . Sexual Activity: Not on file   Other Topics Concern  . Not on file   Social History Narrative   Pt was adopted.   ROS-see HPI   Negative unless "+" Constitutional:    weight loss, night sweats, fevers, chills, fatigue, lassitude. HEENT:    headaches, difficulty swallowing, +tooth/dental problems, sore throat,       sneezing, itching, ear ache, nasal congestion, post nasal drip,  snoring CV:    chest pain, orthopnea, PND, swelling in lower extremities, anasarca,                                  dizziness, +palpitations Resp:   shortness of breath with exertion or at rest.                productive cough,   non-productive cough, coughing up of blood.              change in color of mucus.  wheezing.   Skin:    rash or lesions. GI: +heartburn, +indigestion, abdominal pain, nausea, vomiting, diarrhea,                 change in bowel habits, loss of appetite GU: dysuria, change in color of urine, no urgency or frequency.   flank pain. MS:   +joint pain, stiffness, decreased range of motion, back pain. Neuro-     nothing unusual Psych:  change in mood or affect.  depression or +anxiety.   memory loss.  OBJ- Physical Exam General- Alert, Oriented, Affect-appropriate, Distress- none acute Skin- rash-none, lesions- none, excoriation- none Lymphadenopathy- none Head- atraumatic            Eyes- Gross vision intact, PERRLA, conjunctivae and secretions clear            Ears- +HOH            Nose- Clear, no-Septal dev, mucus, polyps, erosion, perforation             Throat- Mallampati II , mucosa clear , drainage- none, tonsils- atrophic Neck- flexible , trachea midline, no stridor , thyroid nl, carotid no bruit Chest - symmetrical excursion , unlabored           Heart/CV- RRR , no murmur , no gallop  , no rub, nl s1 s2                           - JVD- none , edema- none, stasis changes- none, varices- none           Lung- wheeze + trace end expiratory, cough- none , dullness-none, rub- none           Chest wall-  Abd-  Br/ Gen/ Rectal- Not done, not indicated Extrem- cyanosis- none, clubbing, none, atrophy- none, strength- nl Neuro- grossly intact to observation

## 2014-05-10 NOTE — Patient Instructions (Addendum)
We will check our lab source references for antibody assay against prednisone and methylprednisolone, if available.  Perhaps Dr Nelva Bush can try a different product- different manufacturer, etc. You can try taking a non-sedating antihistamine daily, starting a day before your steroid shot, and continuing for 10-14 days.  Claritin, Allegra, Zyrtec  The stomach ache from prednisone doesn't sound like and allergy and may be a flare of your gastritis despite being on acid blockers.  Instead of prednisone or methylprednisolone you can probably take hydrocortisone/  Solucortef, which is what your body makes normally.   Sample Spiriva Respimat inhaler  2 puffs, once daily. See if you notice any help with your breathing.

## 2014-05-13 DIAGNOSIS — M6281 Muscle weakness (generalized): Secondary | ICD-10-CM | POA: Diagnosis not present

## 2014-05-13 DIAGNOSIS — R278 Other lack of coordination: Secondary | ICD-10-CM | POA: Diagnosis not present

## 2014-05-13 DIAGNOSIS — M62838 Other muscle spasm: Secondary | ICD-10-CM | POA: Diagnosis not present

## 2014-05-13 DIAGNOSIS — N3010 Interstitial cystitis (chronic) without hematuria: Secondary | ICD-10-CM | POA: Diagnosis not present

## 2014-05-18 DIAGNOSIS — K625 Hemorrhage of anus and rectum: Secondary | ICD-10-CM | POA: Diagnosis not present

## 2014-05-18 DIAGNOSIS — D126 Benign neoplasm of colon, unspecified: Secondary | ICD-10-CM | POA: Diagnosis not present

## 2014-05-23 DIAGNOSIS — T783XXA Angioneurotic edema, initial encounter: Secondary | ICD-10-CM | POA: Insufficient documentation

## 2014-05-23 NOTE — Assessment & Plan Note (Signed)
Former smoker. She doesn't hear herself wheeze. Plan-try Spiriva Respimat sample. Flu vaccine for 2015 as requested

## 2014-05-23 NOTE — Assessment & Plan Note (Signed)
She describes difficult to control GERD requiring high-dose Pepcid. It is not surprising that prednisone may aggravate this for her.

## 2014-05-23 NOTE — Assessment & Plan Note (Signed)
We discussed limited ability to test for possible triggers. We are asking our labs sources about ability to test against methylprednisolone for injection. A different brand might be worth trying. She can try pre-medicating with an antihistamine.

## 2014-05-25 DIAGNOSIS — R102 Pelvic and perineal pain: Secondary | ICD-10-CM | POA: Diagnosis not present

## 2014-05-26 DIAGNOSIS — N3010 Interstitial cystitis (chronic) without hematuria: Secondary | ICD-10-CM | POA: Diagnosis not present

## 2014-06-03 DIAGNOSIS — Z86010 Personal history of colonic polyps: Secondary | ICD-10-CM | POA: Diagnosis not present

## 2014-06-03 DIAGNOSIS — K641 Second degree hemorrhoids: Secondary | ICD-10-CM | POA: Diagnosis not present

## 2014-06-03 DIAGNOSIS — K5730 Diverticulosis of large intestine without perforation or abscess without bleeding: Secondary | ICD-10-CM | POA: Diagnosis not present

## 2014-06-03 DIAGNOSIS — K921 Melena: Secondary | ICD-10-CM | POA: Diagnosis not present

## 2014-06-07 DIAGNOSIS — N3010 Interstitial cystitis (chronic) without hematuria: Secondary | ICD-10-CM | POA: Diagnosis not present

## 2014-06-15 DIAGNOSIS — N3010 Interstitial cystitis (chronic) without hematuria: Secondary | ICD-10-CM | POA: Diagnosis not present

## 2014-07-02 DIAGNOSIS — N3010 Interstitial cystitis (chronic) without hematuria: Secondary | ICD-10-CM | POA: Diagnosis not present

## 2014-07-06 DIAGNOSIS — K219 Gastro-esophageal reflux disease without esophagitis: Secondary | ICD-10-CM | POA: Diagnosis not present

## 2014-07-09 DIAGNOSIS — N3010 Interstitial cystitis (chronic) without hematuria: Secondary | ICD-10-CM | POA: Diagnosis not present

## 2014-07-13 DIAGNOSIS — N3010 Interstitial cystitis (chronic) without hematuria: Secondary | ICD-10-CM | POA: Diagnosis not present

## 2014-07-14 DIAGNOSIS — F341 Dysthymic disorder: Secondary | ICD-10-CM | POA: Diagnosis not present

## 2014-07-19 DIAGNOSIS — K219 Gastro-esophageal reflux disease without esophagitis: Secondary | ICD-10-CM | POA: Diagnosis not present

## 2014-07-20 DIAGNOSIS — N3010 Interstitial cystitis (chronic) without hematuria: Secondary | ICD-10-CM | POA: Diagnosis not present

## 2014-07-29 DIAGNOSIS — N3010 Interstitial cystitis (chronic) without hematuria: Secondary | ICD-10-CM | POA: Diagnosis not present

## 2014-07-30 DIAGNOSIS — T148 Other injury of unspecified body region: Secondary | ICD-10-CM | POA: Diagnosis not present

## 2014-07-30 DIAGNOSIS — W19XXXA Unspecified fall, initial encounter: Secondary | ICD-10-CM | POA: Diagnosis not present

## 2014-07-30 DIAGNOSIS — Z23 Encounter for immunization: Secondary | ICD-10-CM | POA: Diagnosis not present

## 2014-08-02 ENCOUNTER — Encounter (HOSPITAL_BASED_OUTPATIENT_CLINIC_OR_DEPARTMENT_OTHER): Payer: Self-pay | Admitting: *Deleted

## 2014-08-02 ENCOUNTER — Emergency Department (HOSPITAL_BASED_OUTPATIENT_CLINIC_OR_DEPARTMENT_OTHER): Payer: Medicare Other

## 2014-08-02 ENCOUNTER — Emergency Department (HOSPITAL_BASED_OUTPATIENT_CLINIC_OR_DEPARTMENT_OTHER)
Admission: EM | Admit: 2014-08-02 | Discharge: 2014-08-02 | Disposition: A | Discharge status: Home / Self Care | Payer: Medicare Other | Attending: Emergency Medicine | Admitting: Emergency Medicine

## 2014-08-02 DIAGNOSIS — S8001XA Contusion of right knee, initial encounter: Secondary | ICD-10-CM | POA: Diagnosis not present

## 2014-08-02 DIAGNOSIS — Z8719 Personal history of other diseases of the digestive system: Secondary | ICD-10-CM | POA: Diagnosis not present

## 2014-08-02 DIAGNOSIS — S80211A Abrasion, right knee, initial encounter: Secondary | ICD-10-CM | POA: Diagnosis not present

## 2014-08-02 DIAGNOSIS — Y9289 Other specified places as the place of occurrence of the external cause: Secondary | ICD-10-CM | POA: Diagnosis not present

## 2014-08-02 DIAGNOSIS — W19XXXA Unspecified fall, initial encounter: Secondary | ICD-10-CM

## 2014-08-02 DIAGNOSIS — Y9389 Activity, other specified: Secondary | ICD-10-CM | POA: Diagnosis not present

## 2014-08-02 DIAGNOSIS — Z8739 Personal history of other diseases of the musculoskeletal system and connective tissue: Secondary | ICD-10-CM | POA: Insufficient documentation

## 2014-08-02 DIAGNOSIS — Z87448 Personal history of other diseases of urinary system: Secondary | ICD-10-CM | POA: Insufficient documentation

## 2014-08-02 DIAGNOSIS — W010XXA Fall on same level from slipping, tripping and stumbling without subsequent striking against object, initial encounter: Secondary | ICD-10-CM | POA: Insufficient documentation

## 2014-08-02 DIAGNOSIS — S90112A Contusion of left great toe without damage to nail, initial encounter: Secondary | ICD-10-CM | POA: Diagnosis not present

## 2014-08-02 DIAGNOSIS — S8992XA Unspecified injury of left lower leg, initial encounter: Secondary | ICD-10-CM | POA: Diagnosis not present

## 2014-08-02 DIAGNOSIS — Y998 Other external cause status: Secondary | ICD-10-CM | POA: Insufficient documentation

## 2014-08-02 DIAGNOSIS — Z8669 Personal history of other diseases of the nervous system and sense organs: Secondary | ICD-10-CM | POA: Diagnosis not present

## 2014-08-02 DIAGNOSIS — M25462 Effusion, left knee: Secondary | ICD-10-CM | POA: Diagnosis not present

## 2014-08-02 DIAGNOSIS — S90122A Contusion of left lesser toe(s) without damage to nail, initial encounter: Secondary | ICD-10-CM

## 2014-08-02 DIAGNOSIS — S8002XA Contusion of left knee, initial encounter: Secondary | ICD-10-CM | POA: Diagnosis not present

## 2014-08-02 DIAGNOSIS — S99922A Unspecified injury of left foot, initial encounter: Secondary | ICD-10-CM | POA: Diagnosis not present

## 2014-08-02 DIAGNOSIS — Z8709 Personal history of other diseases of the respiratory system: Secondary | ICD-10-CM | POA: Diagnosis not present

## 2014-08-02 DIAGNOSIS — Z79899 Other long term (current) drug therapy: Secondary | ICD-10-CM | POA: Diagnosis not present

## 2014-08-02 NOTE — ED Notes (Addendum)
Tripped over a curb 3 days ago.  Injury to her left foot. She was seen by her MD and had a DT.

## 2014-08-02 NOTE — Discharge Instructions (Signed)
Contusion °A contusion is a deep bruise. Contusions are the result of an injury that caused bleeding under the skin. The contusion may turn blue, purple, or yellow. Minor injuries will give you a painless contusion, but more severe contusions may stay painful and swollen for a few weeks.  °CAUSES  °A contusion is usually caused by a blow, trauma, or direct force to an area of the body. °SYMPTOMS  °· Swelling and redness of the injured area. °· Bruising of the injured area. °· Tenderness and soreness of the injured area. °· Pain. °DIAGNOSIS  °The diagnosis can be made by taking a history and physical exam. An X-ray, CT scan, or MRI may be needed to determine if there were any associated injuries, such as fractures. °TREATMENT  °Specific treatment will depend on what area of the body was injured. In general, the best treatment for a contusion is resting, icing, elevating, and applying cold compresses to the injured area. Over-the-counter medicines may also be recommended for pain control. Ask your caregiver what the best treatment is for your contusion. °HOME CARE INSTRUCTIONS  °· Put ice on the injured area. °¨ Put ice in a plastic bag. °¨ Place a towel between your skin and the bag. °¨ Leave the ice on for 15-20 minutes, 3-4 times a day, or as directed by your health care provider. °· Only take over-the-counter or prescription medicines for pain, discomfort, or fever as directed by your caregiver. Your caregiver may recommend avoiding anti-inflammatory medicines (aspirin, ibuprofen, and naproxen) for 48 hours because these medicines may increase bruising. °· Rest the injured area. °· If possible, elevate the injured area to reduce swelling. °SEEK IMMEDIATE MEDICAL CARE IF:  °· You have increased bruising or swelling. °· You have pain that is getting worse. °· Your swelling or pain is not relieved with medicines. °MAKE SURE YOU:  °· Understand these instructions. °· Will watch your condition. °· Will get help right  away if you are not doing well or get worse. °Document Released: 11/22/2004 Document Revised: 02/17/2013 Document Reviewed: 12/18/2010 °ExitCare® Patient Information ©2015 [employer]. This information is not intended to replace advice given to you by your health care provider. Make sure you discuss any questions you have with your health care provider. ° °

## 2014-08-02 NOTE — ED Notes (Signed)
MD at bedside. 

## 2014-08-04 NOTE — ED Provider Notes (Signed)
CSN: [id number]     Arrival date & time 08/02/14  1150 History   First MD Initiated Contact with Patient 08/02/14 1304     Chief Complaint  Patient presents with  . Fall     (Consider location/radiation/quality/duration/timing/severity/associated sxs/prior Treatment) Patient is a 79 y.o. female presenting with fall. The history is provided by the patient. No language interpreter was used.  Fall This is a new problem. The current episode started yesterday. Pertinent negatives include no chest pain, no abdominal pain, no headaches and no shortness of breath. Nothing aggravates the symptoms. Nothing relieves the symptoms. She has tried rest for the symptoms. The treatment provided mild relief.    Past Medical History  Diagnosis Date  . Chronic bronchitis   . Allergic rhinitis   . Decreased hearing     intol hearing aids  . Fibromyalgia   . Interstitial cystitis   . Chronic fatigue   . Eustachian tube dysfunction     decreased hearing  . GERD (gastroesophageal reflux disease)     Nissen   Past Surgical History  Procedure Laterality Date  . Nissen fundoplication     Family History  Problem Relation Age of Onset  . Asthma Mother     biologic  . Asthma Son    History  Substance Use Topics  . Smoking status: Former Research scientist (life sciences)  . Smokeless tobacco: Not on file  . Alcohol Use: Not on file   OB History    No data available     Review of Systems  Constitutional: Negative for fever, chills, diaphoresis, activity change, appetite change and fatigue.  HENT: Negative for congestion, facial swelling, rhinorrhea and sore throat.   Eyes: Negative for photophobia and discharge.  Respiratory: Negative for cough, chest tightness and shortness of breath.   Cardiovascular: Negative for chest pain, palpitations and leg swelling.  Gastrointestinal: Negative for nausea, vomiting, abdominal pain and diarrhea.  Endocrine: Negative for polydipsia and polyuria.  Genitourinary: Negative for  dysuria, frequency, difficulty urinating and pelvic pain.  Musculoskeletal: Negative for back pain, arthralgias, neck pain and neck stiffness.  Skin: Negative for color change and wound.  Allergic/Immunologic: Negative for immunocompromised state.  Neurological: Negative for facial asymmetry, weakness, numbness and headaches.  Hematological: Does not bruise/bleed easily.  Psychiatric/Behavioral: Negative for confusion and agitation.      Allergies  Cortisone; Prednisone; and Statins  Home Medications   Prior to Admission medications   Medication Sig Start Date End Date Taking? Authorizing Provider  albuterol (PROAIR HFA) 108 (90 BASE) MCG/ACT inhaler Inhale 2 puffs into the lungs every 6 (six) hours as needed for wheezing. 10/04/10 10/04/11  Deneise Lever, MD  buPROPion (WELLBUTRIN SR) 200 MG 12 hr tablet Take 200 mg by mouth daily.      Historical Provider, MD  clonazePAM (KLONOPIN) 2 MG tablet Take 2 mg by mouth 2 (two) times daily.     Historical Provider, MD  Famotidine (PEPCID AC PO) Take by mouth.    Historical Provider, MD  famotidine (PEPCID) 10 MG tablet Take 20 mg by mouth 2 (two) times daily.    Historical Provider, MD  Tiotropium Bromide Monohydrate (SPIRIVA RESPIMAT) 2.5 MCG/ACT AERS Inhale 2 puffs into the lungs daily. 05/10/14   Deneise Lever, MD   BP 139/83 mmHg  Pulse 80  Temp(Src) 98.1 F (36.7 C) (Oral)  Resp 18  Ht 5\' 5"  (1.651 m)  Wt 147 lb (66.679 kg)  BMI 24.46 kg/m2  SpO2 100% Physical Exam  Constitutional:  She is oriented to person, place, and time. She appears well-developed and well-nourished. No distress.  HENT:  Head: Normocephalic and atraumatic.  Mouth/Throat: No oropharyngeal exudate.  Eyes: Pupils are equal, round, and reactive to light.  Neck: Normal range of motion. Neck supple.  Cardiovascular: Normal rate, regular rhythm and normal heart sounds.  Exam reveals no gallop and no friction rub.   No murmur heard. Pulmonary/Chest: Effort normal  and breath sounds normal. No respiratory distress. She has no wheezes. She has no rales.  Abdominal: Soft. Bowel sounds are normal. She exhibits no distension and no mass. There is no tenderness. There is no rebound and no guarding.  Musculoskeletal: Normal range of motion. She exhibits no edema or tenderness.       Hands:      Legs:      Feet:  Neurological: She is alert and oriented to person, place, and time.  Skin: Skin is warm and dry.  Psychiatric: She has a normal mood and affect.    ED Course  Procedures (including critical care time) Labs Review Labs Reviewed - No data to display  Imaging Review Dg Knee Complete 4 Views Left  08/02/2014   CLINICAL DATA:  Fall.  EXAM: LEFT KNEE - COMPLETE 4+ VIEW  COMPARISON:  None  FINDINGS: There is patellofemoral and medial compartment narrowing and marginal spur formation. Sharpening the tibial spines noted. Small suprapatellar joint effusion is noted. No fracture or subluxation identified. No radio-opaque foreign body or soft tissue calcification.  IMPRESSION: 1. Osteoarthritis. 2. Small joint effusion.   Electronically Signed   By: Kerby Moors M.D.   On: 08/02/2014 12:55   Dg Knee Complete 4 Views Right  08/02/2014   CLINICAL DATA:  Fall off of current 3 days ago with right knee abrasions and pain.  EXAM: RIGHT KNEE - COMPLETE 4+ VIEW  COMPARISON:  None.  FINDINGS: No acute fracture or dislocation is identified. There is mild medial joint space narrowing consistent with osteoarthritis. Mild patellofemoral disease present. No bony lesions or destruction.  IMPRESSION: No acute fracture. Evidence of osteoarthritis affecting the medial joint space and patellofemoral joint.   Electronically Signed   By: Aletta Edouard M.D.   On: 08/02/2014 12:58   Dg Foot Complete Left  08/02/2014   CLINICAL DATA:  Tripped on curb and fell 3 days ago.  EXAM: LEFT FOOT - COMPLETE 3+ VIEW  COMPARISON:  None.  FINDINGS: There is no evidence of fracture or dislocation.  Small plantar heel spur. There is no evidence of arthropathy or other focal bone abnormality. Soft tissues are unremarkable.  IMPRESSION: 1. No acute findings.   Electronically Signed   By: Kerby Moors M.D.   On: 08/02/2014 12:54     EKG Interpretation None      MDM   Final diagnoses:  Fall from standing, initial encounter  Knee contusion, left, initial encounter  Knee contusion, right, initial encounter  Toe contusion, left, initial encounter   Pt is a 79 y.o. female with Pmhx as above who presents with BL knee pain, L hand pain L great toes pain after a mechanical fall. No head injury or LOC. Denies h/a, neck pain, chest pain, ab pain. XR knees, L foot negative. No bony tenderness on L hand, appear to be abrasions only. Will rec supportive care a thome.      [name] evaluation in the Emergency Department is complete. It has been determined that no acute conditions requiring further emergency intervention are  present at this time. The patient/guardian have been advised of the diagnosis and plan. We have discussed signs and symptoms that warrant return to the ED, such as changes or worsening in symptoms, worsening pain, h/a, numbness, weakness.       Ernestina Patches, MD 08/04/14 [phone]

## 2014-08-05 DIAGNOSIS — N3010 Interstitial cystitis (chronic) without hematuria: Secondary | ICD-10-CM | POA: Diagnosis not present

## 2014-08-06 DIAGNOSIS — L03116 Cellulitis of left lower limb: Secondary | ICD-10-CM | POA: Diagnosis not present

## 2014-08-12 DIAGNOSIS — N183 Chronic kidney disease, stage 3 (moderate): Secondary | ICD-10-CM | POA: Diagnosis not present

## 2014-08-12 DIAGNOSIS — E119 Type 2 diabetes mellitus without complications: Secondary | ICD-10-CM | POA: Diagnosis not present

## 2014-08-12 DIAGNOSIS — N3010 Interstitial cystitis (chronic) without hematuria: Secondary | ICD-10-CM | POA: Diagnosis not present

## 2014-08-12 DIAGNOSIS — R400 Somnolence: Secondary | ICD-10-CM | POA: Diagnosis not present

## 2014-08-12 DIAGNOSIS — F325 Major depressive disorder, single episode, in full remission: Secondary | ICD-10-CM | POA: Diagnosis not present

## 2014-08-12 DIAGNOSIS — I129 Hypertensive chronic kidney disease with stage 1 through stage 4 chronic kidney disease, or unspecified chronic kidney disease: Secondary | ICD-10-CM | POA: Diagnosis not present

## 2014-08-12 DIAGNOSIS — Z1389 Encounter for screening for other disorder: Secondary | ICD-10-CM | POA: Diagnosis not present

## 2014-08-19 DIAGNOSIS — I129 Hypertensive chronic kidney disease with stage 1 through stage 4 chronic kidney disease, or unspecified chronic kidney disease: Secondary | ICD-10-CM | POA: Diagnosis not present

## 2014-08-19 DIAGNOSIS — E119 Type 2 diabetes mellitus without complications: Secondary | ICD-10-CM | POA: Diagnosis not present

## 2014-08-19 DIAGNOSIS — N3010 Interstitial cystitis (chronic) without hematuria: Secondary | ICD-10-CM | POA: Diagnosis not present

## 2014-08-25 DIAGNOSIS — F341 Dysthymic disorder: Secondary | ICD-10-CM | POA: Diagnosis not present

## 2014-09-02 DIAGNOSIS — N3010 Interstitial cystitis (chronic) without hematuria: Secondary | ICD-10-CM | POA: Diagnosis not present

## 2014-09-09 DIAGNOSIS — N3010 Interstitial cystitis (chronic) without hematuria: Secondary | ICD-10-CM | POA: Diagnosis not present

## 2014-09-13 DIAGNOSIS — K219 Gastro-esophageal reflux disease without esophagitis: Secondary | ICD-10-CM | POA: Diagnosis not present

## 2014-09-16 DIAGNOSIS — N3010 Interstitial cystitis (chronic) without hematuria: Secondary | ICD-10-CM | POA: Diagnosis not present

## 2014-09-17 DIAGNOSIS — G4710 Hypersomnia, unspecified: Secondary | ICD-10-CM | POA: Diagnosis not present

## 2014-09-21 ENCOUNTER — Ambulatory Visit
Admission: RE | Admit: 2014-09-21 | Discharge: 2014-09-21 | Disposition: A | Discharge status: Home / Self Care | Payer: Medicare Other | Source: Ambulatory Visit | Attending: Internal Medicine | Admitting: Internal Medicine

## 2014-09-21 ENCOUNTER — Other Ambulatory Visit: Payer: Self-pay | Admitting: Internal Medicine

## 2014-09-21 DIAGNOSIS — M79672 Pain in left foot: Secondary | ICD-10-CM

## 2014-09-21 DIAGNOSIS — S99922A Unspecified injury of left foot, initial encounter: Secondary | ICD-10-CM | POA: Diagnosis not present

## 2014-09-23 DIAGNOSIS — N3010 Interstitial cystitis (chronic) without hematuria: Secondary | ICD-10-CM | POA: Diagnosis not present

## 2014-10-04 DIAGNOSIS — N3010 Interstitial cystitis (chronic) without hematuria: Secondary | ICD-10-CM | POA: Diagnosis not present

## 2014-10-14 DIAGNOSIS — F341 Dysthymic disorder: Secondary | ICD-10-CM | POA: Diagnosis not present

## 2014-10-14 DIAGNOSIS — N3010 Interstitial cystitis (chronic) without hematuria: Secondary | ICD-10-CM | POA: Diagnosis not present

## 2014-10-21 DIAGNOSIS — N3010 Interstitial cystitis (chronic) without hematuria: Secondary | ICD-10-CM | POA: Diagnosis not present

## 2014-10-25 DIAGNOSIS — M2022 Hallux rigidus, left foot: Secondary | ICD-10-CM | POA: Diagnosis not present

## 2014-10-25 DIAGNOSIS — M25562 Pain in left knee: Secondary | ICD-10-CM | POA: Diagnosis not present

## 2014-10-26 DIAGNOSIS — R1013 Epigastric pain: Secondary | ICD-10-CM | POA: Diagnosis not present

## 2014-10-28 DIAGNOSIS — N3010 Interstitial cystitis (chronic) without hematuria: Secondary | ICD-10-CM | POA: Diagnosis not present

## 2014-11-11 DIAGNOSIS — N3010 Interstitial cystitis (chronic) without hematuria: Secondary | ICD-10-CM | POA: Diagnosis not present

## 2014-11-11 DIAGNOSIS — N343 Urethral syndrome, unspecified: Secondary | ICD-10-CM | POA: Diagnosis not present

## 2014-11-12 ENCOUNTER — Other Ambulatory Visit: Payer: Self-pay | Admitting: Gastroenterology

## 2014-11-12 DIAGNOSIS — R4702 Dysphasia: Secondary | ICD-10-CM

## 2014-11-17 ENCOUNTER — Ambulatory Visit
Admission: RE | Admit: 2014-11-17 | Discharge: 2014-11-17 | Disposition: A | Discharge status: Home / Self Care | Payer: Medicare Other | Source: Ambulatory Visit | Attending: Gastroenterology | Admitting: Gastroenterology

## 2014-11-17 DIAGNOSIS — R4702 Dysphasia: Secondary | ICD-10-CM

## 2014-11-17 DIAGNOSIS — K824 Cholesterolosis of gallbladder: Secondary | ICD-10-CM | POA: Diagnosis not present

## 2014-11-18 DIAGNOSIS — N3010 Interstitial cystitis (chronic) without hematuria: Secondary | ICD-10-CM | POA: Diagnosis not present

## 2014-11-18 DIAGNOSIS — N343 Urethral syndrome, unspecified: Secondary | ICD-10-CM | POA: Diagnosis not present

## 2014-11-25 DIAGNOSIS — N3010 Interstitial cystitis (chronic) without hematuria: Secondary | ICD-10-CM | POA: Diagnosis not present

## 2014-11-29 DIAGNOSIS — H04123 Dry eye syndrome of bilateral lacrimal glands: Secondary | ICD-10-CM | POA: Diagnosis not present

## 2014-11-29 DIAGNOSIS — E119 Type 2 diabetes mellitus without complications: Secondary | ICD-10-CM | POA: Diagnosis not present

## 2014-11-29 DIAGNOSIS — H524 Presbyopia: Secondary | ICD-10-CM | POA: Diagnosis not present

## 2014-11-29 DIAGNOSIS — H01001 Unspecified blepharitis right upper eyelid: Secondary | ICD-10-CM | POA: Diagnosis not present

## 2014-12-02 DIAGNOSIS — N3010 Interstitial cystitis (chronic) without hematuria: Secondary | ICD-10-CM | POA: Diagnosis not present

## 2014-12-03 ENCOUNTER — Other Ambulatory Visit (HOSPITAL_COMMUNITY): Payer: Self-pay | Admitting: Surgery

## 2014-12-03 DIAGNOSIS — K828 Other specified diseases of gallbladder: Secondary | ICD-10-CM | POA: Diagnosis not present

## 2014-12-13 DIAGNOSIS — N3010 Interstitial cystitis (chronic) without hematuria: Secondary | ICD-10-CM | POA: Diagnosis not present

## 2014-12-16 ENCOUNTER — Ambulatory Visit (HOSPITAL_COMMUNITY)
Admission: RE | Admit: 2014-12-16 | Discharge: 2014-12-16 | Disposition: A | Discharge status: Home / Self Care | Payer: Medicare Other | Source: Ambulatory Visit | Attending: Surgery | Admitting: Surgery

## 2014-12-16 DIAGNOSIS — K828 Other specified diseases of gallbladder: Secondary | ICD-10-CM | POA: Diagnosis not present

## 2014-12-16 DIAGNOSIS — R1011 Right upper quadrant pain: Secondary | ICD-10-CM | POA: Insufficient documentation

## 2014-12-16 DIAGNOSIS — R110 Nausea: Secondary | ICD-10-CM | POA: Diagnosis not present

## 2014-12-16 DIAGNOSIS — R948 Abnormal results of function studies of other organs and systems: Secondary | ICD-10-CM | POA: Diagnosis not present

## 2014-12-16 MED ORDER — TECHNETIUM TC 99M MEBROFENIN IV KIT
5.5000 | PACK | Freq: Once | INTRAVENOUS | Status: DC | PRN
  Administered 2014-12-16: 6 via INTRAVENOUS
  Filled 2014-12-16: qty 6

## 2014-12-16 MED ORDER — SINCALIDE 5 MCG IJ SOLR
INTRAMUSCULAR | Status: AC
  Filled 2014-12-16: qty 5

## 2014-12-16 MED ORDER — SINCALIDE 5 MCG IJ SOLR
0.0200 ug/kg | Freq: Once | INTRAMUSCULAR | Status: AC
  Administered 2014-12-16: 1.34 ug via INTRAVENOUS

## 2014-12-20 DIAGNOSIS — N3010 Interstitial cystitis (chronic) without hematuria: Secondary | ICD-10-CM | POA: Diagnosis not present

## 2014-12-22 DIAGNOSIS — K580 Irritable bowel syndrome with diarrhea: Secondary | ICD-10-CM | POA: Diagnosis not present

## 2014-12-22 DIAGNOSIS — K828 Other specified diseases of gallbladder: Secondary | ICD-10-CM | POA: Diagnosis not present

## 2014-12-22 DIAGNOSIS — K219 Gastro-esophageal reflux disease without esophagitis: Secondary | ICD-10-CM | POA: Diagnosis not present

## 2014-12-22 DIAGNOSIS — M797 Fibromyalgia: Secondary | ICD-10-CM | POA: Diagnosis not present

## 2014-12-22 DIAGNOSIS — M5136 Other intervertebral disc degeneration, lumbar region: Secondary | ICD-10-CM | POA: Diagnosis not present

## 2014-12-23 ENCOUNTER — Ambulatory Visit: Payer: Self-pay | Admitting: Surgery

## 2014-12-23 DIAGNOSIS — K828 Other specified diseases of gallbladder: Secondary | ICD-10-CM | POA: Diagnosis not present

## 2014-12-23 NOTE — H&P (Signed)
Erica C. [facility] 12/23/2014 1:35 PM Location: [facility] Patient #: [id number] DOB: 1935/07/29 Married / Language: English / Race: White Female  History of Present Illness [name] Done MD; 12/23/2014 2:00 PM) The patient is a 79 year old female who presents for evaluation of gallbladder disease. Her HIDA scan showed a 12% ejection fraction. She's been having right upper quadrant pain and pain in her back. She also has fibromyalgia and interstitial cystitis. I also had previously done a lap Nissen on her.  I explained laparoscopic cholecystectomy to her and gave her a booklet. Offered the complications not limited to common duct injury bleeding bowel leaks and she is aware of these. She wants going get this scheduled as soon as possible.   Allergies Elbert Ewings, CMA; 12/23/2014 1:35 PM) Crestor *ANTIHYPERLIPIDEMICS* Niaspan *ANTIHYPERLIPIDEMICS* Sulfa 10 *OPHTHALMIC AGENTS* Codeine Sulfate *ANALGESICS - OPIOID* Cymbalta *ANTIDEPRESSANTS* Diovan *ANTIHYPERTENSIVES* AmLODIPine Besylate *CALCIUM CHANNEL BLOCKERS* Cough. Cortisone *CORTICOSTEROIDS* Omnipaque *DIAGNOSTIC PRODUCTS* Anaphylaxis, Swelling. Dexamethasone (Ophth) *OPHTHALMIC AGENTS* Anaphylaxis.  Medication History Elbert Ewings, CMA; 12/23/2014 1:35 PM) Wellbutrin SR (200MG  Tablet ER 12HR, Oral two times daily) Active. ClonazePAM (1MG  Tablet, Oral two times daily) Active. PriLOSEC (20MG  Capsule DR, Oral) Active. Dexilant (60MG  Capsule DR, Oral) Active. Sulfamethoxazole-Trimethoprim (800-160MG  Tablet, Oral) Active. Medications Reconciled    Vitals Elbert Ewings CMA; 12/23/2014 1:36 PM) 12/23/2014 1:35 PM Weight: 166.6 lb Height: 65in Body Surface Area: 1.83 m Body Mass Index: 27.72 kg/m  Temp.: 97.56F(Temporal)  Pulse: 70 (Regular)  BP: 130/70 (Sitting, Left Arm, Standard)      Physical Exam (Keilen Kahl B. Hassell Done MD; 12/23/2014 2:01 PM)  General Note: Head  normocephalic Eyes sclerae are nonicteric pupils are equal round and reactive to light. Nose and throat exam unremarkable. Neck supple without masses. Chest clear to auscultation Heart sinus rhythm without murmurs or gallops Abdomen soreness in the right upper quadrant with radiation to the back. Extremity exam arthritis pain with fibromyalgia. Neuro alert and oriented 3. Emergency function is grossly intact    Assessment & Plan [name] Done MD; 12/23/2014 2:02 PM)  BILIARY DYSKINESIA (K82.8) Impression: HIDA + for low ejection fraction. Will schedule lap chole

## 2014-12-31 DIAGNOSIS — N3010 Interstitial cystitis (chronic) without hematuria: Secondary | ICD-10-CM | POA: Diagnosis not present

## 2015-01-03 DIAGNOSIS — F341 Dysthymic disorder: Secondary | ICD-10-CM | POA: Diagnosis not present

## 2015-01-03 DIAGNOSIS — Z23 Encounter for immunization: Secondary | ICD-10-CM | POA: Diagnosis not present

## 2015-01-10 ENCOUNTER — Encounter (HOSPITAL_COMMUNITY): Payer: Self-pay

## 2015-01-10 ENCOUNTER — Encounter (HOSPITAL_COMMUNITY)
Admission: RE | Admit: 2015-01-10 | Discharge: 2015-01-10 | Disposition: A | Discharge status: Home / Self Care | Payer: Medicare Other | Source: Ambulatory Visit | Attending: Surgery | Admitting: Surgery

## 2015-01-10 DIAGNOSIS — Z01812 Encounter for preprocedural laboratory examination: Secondary | ICD-10-CM | POA: Diagnosis not present

## 2015-01-10 LAB — CBC
HCT: 42.9 % (ref 36.0–46.0)
HEMOGLOBIN: 14 g/dL (ref 12.0–15.0)
MCH: 29.5 pg (ref 26.0–34.0)
MCHC: 32.6 g/dL (ref 30.0–36.0)
MCV: 90.5 fL (ref 78.0–100.0)
PLATELETS: 342 10*3/uL (ref 150–400)
RBC: 4.74 MIL/uL (ref 3.87–5.11)
RDW: 14.1 % (ref 11.5–15.5)
WBC: 7.7 10*3/uL (ref 4.0–10.5)

## 2015-01-10 NOTE — Patient Instructions (Addendum)
[place]  01/10/2015   Your procedure is scheduled on:   01-14-2015 Friday  Enter through [place] and follow signs to [facility] to [place]. Arrive at  1100      AM.  (Limit 1 person with you).  Call this number if you have problems the morning of surgery: [phone]  Or Presurgical Testing [phone] days before.   For Living Will and/or Health Care Power Attorney Forms: please provide copy for your medical record,may bring AM of surgery(Forms should be already notarized -we do not provide this service).(01-10-15 Yes, prefers not to bring at this time).     Do not eat food/ or drink: After Midnight.  Exception: may have clear liquids:up to 6 Hours before arrival. Nothing after: 0700 AM  Clear liquids include soda, tea, black coffee, apple or grape juice, broth.  Take these medicines the morning of surgery with A SIP OF WATER-   (DO NOT TAKE ANY DIABETIC MEDS AM OF SURGERY) : Bupropion. Clonazepam. Prilosec. Use/bring Inhalers.   Do not wear jewelry, make-up or nail polish.  Do not wear deodorant, lotions, powders, or perfumes.   Do not shave legs and under arms- 48 hours(2 days) prior to first CHG shower.(Shaving face and neck okay.)  Do not bring valuables to the hospital.(Hospital is not responsible for lost valuables).  Contacts, dentures or removable bridgework, body piercing, hair pins may not be worn into surgery.  Leave suitcase in the car. After surgery it may be brought to your room.  For patients admitted to the hospital, checkout time is 11:00 AM the day of discharge.(Restricted visitors-Any Persons displaying flu-like symptoms or illness).    Patients discharged the day of surgery will not be allowed to drive home. Must have responsible person with you x 24 hours once discharged.  Name and phone number of your driver: Karington Birdsong -spouse [phone] home -doesn't drive/. Jacqlyn Larsen baldwin  [employer]-[phone]    Please read over the following fact sheets that you were given:  CHG(Chlorhexidine Gluconate 4% Surgical Soap) use.           [facility] - Preparing for Surgery Before surgery, you can play an important role.  Because skin is not sterile, your skin needs to be as free of germs as possible.  You can reduce the number of germs on your skin by washing with CHG (chlorahexidine gluconate) soap before surgery.  CHG is an antiseptic cleaner which kills germs and bonds with the skin to continue killing germs even after washing. Please DO NOT use if you have an allergy to CHG or antibacterial soaps.  If your skin becomes reddened/irritated stop using the CHG and inform your nurse when you arrive at Short Stay. Do not shave (including legs and underarms) for at least 48 hours prior to the first CHG shower.  You may shave your face/neck. Please follow these instructions carefully:  1.  Shower with CHG Soap the night before surgery and the  morning of Surgery.  2.  If you choose to wash your hair, wash your hair first as usual with your  normal  shampoo.  3.  After you shampoo, rinse your hair and body thoroughly to remove the  shampoo.                           4.  Use CHG as you would any other liquid soap.  You can  apply chg directly  to the skin and wash                       Gently with a scrungie or clean washcloth.  5.  Apply the CHG Soap to your body ONLY FROM THE NECK DOWN.   Do not use on face/ open                           Wound or open sores. Avoid contact with eyes, ears mouth and genitals (private parts).                       Wash face,  Genitals (private parts) with your normal soap.             6.  Wash thoroughly, paying special attention to the area where your surgery  will be performed.  7.  Thoroughly rinse your body with warm water from the neck down.  8.  DO NOT shower/wash with your normal soap after using and rinsing off  the CHG Soap.                 9.  Pat yourself dry with a clean towel.            10.  Wear clean pajamas.            11.  Place clean sheets on your bed the night of your first shower and do not  sleep with pets. Day of Surgery : Do not apply any lotions/deodorants the morning of surgery.  Please wear clean clothes to the hospital/surgery center.  FAILURE TO FOLLOW THESE INSTRUCTIONS MAY RESULT IN THE CANCELLATION OF YOUR SURGERY PATIENT SIGNATURE_________________________________  NURSE SIGNATURE__________________________________  ________________________________________________________________________

## 2015-01-12 ENCOUNTER — Encounter: Payer: Self-pay | Admitting: Internal Medicine

## 2015-01-13 DIAGNOSIS — N3010 Interstitial cystitis (chronic) without hematuria: Secondary | ICD-10-CM | POA: Diagnosis not present

## 2015-01-14 ENCOUNTER — Ambulatory Visit (HOSPITAL_COMMUNITY): Payer: Medicare Other | Admitting: Anesthesiology

## 2015-01-14 ENCOUNTER — Observation Stay (HOSPITAL_COMMUNITY)
Admission: RE | Admit: 2015-01-14 | Discharge: 2015-01-15 | Disposition: A | Discharge status: Home / Self Care | Payer: Medicare Other | Source: Ambulatory Visit | Attending: Surgery | Admitting: Surgery

## 2015-01-14 ENCOUNTER — Ambulatory Visit (HOSPITAL_COMMUNITY): Payer: Medicare Other

## 2015-01-14 ENCOUNTER — Encounter (HOSPITAL_COMMUNITY): Payer: Self-pay | Admitting: *Deleted

## 2015-01-14 ENCOUNTER — Encounter (HOSPITAL_COMMUNITY)
Admission: RE | Disposition: A | Discharge status: Home / Self Care | Payer: Self-pay | Source: Ambulatory Visit | Attending: Surgery

## 2015-01-14 DIAGNOSIS — M797 Fibromyalgia: Secondary | ICD-10-CM | POA: Diagnosis not present

## 2015-01-14 DIAGNOSIS — J45909 Unspecified asthma, uncomplicated: Secondary | ICD-10-CM | POA: Insufficient documentation

## 2015-01-14 DIAGNOSIS — J302 Other seasonal allergic rhinitis: Secondary | ICD-10-CM | POA: Diagnosis present

## 2015-01-14 DIAGNOSIS — K8010 Calculus of gallbladder with chronic cholecystitis without obstruction: Principal | ICD-10-CM | POA: Insufficient documentation

## 2015-01-14 DIAGNOSIS — Z882 Allergy status to sulfonamides status: Secondary | ICD-10-CM | POA: Insufficient documentation

## 2015-01-14 DIAGNOSIS — Z419 Encounter for procedure for purposes other than remedying health state, unspecified: Secondary | ICD-10-CM

## 2015-01-14 DIAGNOSIS — Z87891 Personal history of nicotine dependence: Secondary | ICD-10-CM | POA: Insufficient documentation

## 2015-01-14 DIAGNOSIS — K828 Other specified diseases of gallbladder: Secondary | ICD-10-CM | POA: Diagnosis not present

## 2015-01-14 DIAGNOSIS — K811 Chronic cholecystitis: Secondary | ICD-10-CM | POA: Diagnosis present

## 2015-01-14 DIAGNOSIS — Z888 Allergy status to other drugs, medicaments and biological substances status: Secondary | ICD-10-CM | POA: Insufficient documentation

## 2015-01-14 DIAGNOSIS — J3089 Other allergic rhinitis: Secondary | ICD-10-CM

## 2015-01-14 DIAGNOSIS — Z9049 Acquired absence of other specified parts of digestive tract: Secondary | ICD-10-CM | POA: Diagnosis not present

## 2015-01-14 DIAGNOSIS — K219 Gastro-esophageal reflux disease without esophagitis: Secondary | ICD-10-CM | POA: Diagnosis not present

## 2015-01-14 HISTORY — PX: CHOLECYSTECTOMY: SHX55

## 2015-01-14 LAB — CREATININE, SERUM
CREATININE: 1.11 mg/dL — AB (ref 0.44–1.00)
GFR, EST AFRICAN AMERICAN: 54 mL/min — AB (ref >60)
GFR, EST NON AFRICAN AMERICAN: 47 mL/min — AB (ref >60)

## 2015-01-14 LAB — CBC
HEMATOCRIT: 41.8 % (ref 36.0–46.0)
Hemoglobin: 13.5 g/dL (ref 12.0–15.0)
MCH: 29.6 pg (ref 26.0–34.0)
MCHC: 32.3 g/dL (ref 30.0–36.0)
MCV: 91.7 fL (ref 78.0–100.0)
PLATELETS: 334 10*3/uL (ref 150–400)
RBC: 4.56 MIL/uL (ref 3.87–5.11)
RDW: 14.3 % (ref 11.5–15.5)
WBC: 11.9 10*3/uL — AB (ref 4.0–10.5)

## 2015-01-14 SURGERY — LAPAROSCOPIC CHOLECYSTECTOMY WITH INTRAOPERATIVE CHOLANGIOGRAM
Anesthesia: General

## 2015-01-14 MED ORDER — ALBUTEROL SULFATE (2.5 MG/3ML) 0.083% IN NEBU
3.0000 mL | INHALATION_SOLUTION | Freq: Four times a day (QID) | RESPIRATORY_TRACT | Status: DC | PRN
  Administered 2015-01-14 – 2015-01-15 (×2): 3 mL via RESPIRATORY_TRACT
  Filled 2015-01-14 (×2): qty 3

## 2015-01-14 MED ORDER — LIDOCAINE HCL (CARDIAC) 20 MG/ML IV SOLN
INTRAVENOUS | Status: AC
  Filled 2015-01-14: qty 5

## 2015-01-14 MED ORDER — HYDROCODONE-ACETAMINOPHEN 5-325 MG PO TABS
1.0000 | ORAL_TABLET | ORAL | Status: DC | PRN
  Administered 2015-01-15 (×3): 1 via ORAL
  Filled 2015-01-14 (×3): qty 1
  Filled 2015-01-14: qty 2

## 2015-01-14 MED ORDER — ONDANSETRON HCL 4 MG/2ML IJ SOLN
INTRAMUSCULAR | Status: AC
  Filled 2015-01-14: qty 2

## 2015-01-14 MED ORDER — ROCURONIUM BROMIDE 100 MG/10ML IV SOLN
INTRAVENOUS | Status: AC
  Filled 2015-01-14: qty 1

## 2015-01-14 MED ORDER — PROPOFOL 10 MG/ML IV BOLUS
INTRAVENOUS | Status: AC
  Filled 2015-01-14: qty 20

## 2015-01-14 MED ORDER — FENTANYL CITRATE (PF) 250 MCG/5ML IJ SOLN
INTRAMUSCULAR | Status: AC
  Filled 2015-01-14: qty 5

## 2015-01-14 MED ORDER — GLYCOPYRROLATE 0.2 MG/ML IJ SOLN
INTRAMUSCULAR | Status: DC | PRN
  Administered 2015-01-14: 0.6 mg via INTRAVENOUS

## 2015-01-14 MED ORDER — FENTANYL CITRATE (PF) 100 MCG/2ML IJ SOLN
INTRAMUSCULAR | Status: AC
  Filled 2015-01-14: qty 2

## 2015-01-14 MED ORDER — PROPOFOL 10 MG/ML IV BOLUS
INTRAVENOUS | Status: DC | PRN
Start: 2015-01-14 — End: 2015-01-14
  Administered 2015-01-14: 200 mg via INTRAVENOUS

## 2015-01-14 MED ORDER — CEFAZOLIN SODIUM-DEXTROSE 2-3 GM-% IV SOLR
INTRAVENOUS | Status: AC
  Filled 2015-01-14: qty 50

## 2015-01-14 MED ORDER — KCL IN DEXTROSE-NACL 20-5-0.45 MEQ/L-%-% IV SOLN
INTRAVENOUS | Status: DC
  Administered 2015-01-14: 19:00:00 via INTRAVENOUS
  Administered 2015-01-15: 100 mL/h via INTRAVENOUS
  Filled 2015-01-14 (×4): qty 1000

## 2015-01-14 MED ORDER — HYDROMORPHONE HCL 1 MG/ML IJ SOLN
INTRAMUSCULAR | Status: AC
  Filled 2015-01-14: qty 1

## 2015-01-14 MED ORDER — FENTANYL CITRATE (PF) 100 MCG/2ML IJ SOLN
25.0000 ug | INTRAMUSCULAR | Status: DC | PRN
  Administered 2015-01-14: 50 ug via INTRAVENOUS

## 2015-01-14 MED ORDER — 0.9 % SODIUM CHLORIDE (POUR BTL) OPTIME
TOPICAL | Status: DC | PRN
  Administered 2015-01-14: 1000 mL

## 2015-01-14 MED ORDER — BENZONATATE 100 MG PO CAPS
100.0000 mg | ORAL_CAPSULE | Freq: Three times a day (TID) | ORAL | Status: DC | PRN
  Administered 2015-01-15: 100 mg via ORAL
  Filled 2015-01-14: qty 1

## 2015-01-14 MED ORDER — HEPARIN SODIUM (PORCINE) 5000 UNIT/ML IJ SOLN
5000.0000 [IU] | Freq: Once | INTRAMUSCULAR | Status: AC
  Administered 2015-01-14: 5000 [IU] via SUBCUTANEOUS
  Filled 2015-01-14: qty 1

## 2015-01-14 MED ORDER — ONDANSETRON 4 MG PO TBDP: 4.0000 mg | ORAL_TABLET | Freq: Four times a day (QID) | ORAL | Status: DC | PRN

## 2015-01-14 MED ORDER — LACTATED RINGERS IV SOLN
INTRAVENOUS | Status: DC
  Administered 2015-01-14: 1000 mL via INTRAVENOUS

## 2015-01-14 MED ORDER — HEPARIN SODIUM (PORCINE) 5000 UNIT/ML IJ SOLN
5000.0000 [IU] | Freq: Three times a day (TID) | INTRAMUSCULAR | Status: DC
  Administered 2015-01-14 – 2015-01-15 (×4): 5000 [IU] via SUBCUTANEOUS
  Filled 2015-01-14 (×6): qty 1

## 2015-01-14 MED ORDER — HYDRALAZINE HCL 20 MG/ML IJ SOLN: 10.0000 mg | INTRAMUSCULAR | Status: DC | PRN

## 2015-01-14 MED ORDER — CHLORHEXIDINE GLUCONATE 4 % EX LIQD: 1.0000 "application " | Freq: Once | CUTANEOUS | Status: DC

## 2015-01-14 MED ORDER — HYDROMORPHONE HCL 1 MG/ML IJ SOLN
0.5000 mg | INTRAMUSCULAR | Status: DC | PRN
Start: 2015-01-14 — End: 2015-01-15
  Administered 2015-01-14: 0.5 mg via INTRAVENOUS
  Filled 2015-01-14: qty 1

## 2015-01-14 MED ORDER — NEOSTIGMINE METHYLSULFATE 10 MG/10ML IV SOLN
INTRAVENOUS | Status: AC
  Filled 2015-01-14: qty 1

## 2015-01-14 MED ORDER — LIDOCAINE HCL (CARDIAC) 20 MG/ML IV SOLN
INTRAVENOUS | Status: DC | PRN
  Administered 2015-01-14: 50 mg via INTRAVENOUS

## 2015-01-14 MED ORDER — GLYCOPYRROLATE 0.2 MG/ML IJ SOLN
INTRAMUSCULAR | Status: AC
  Filled 2015-01-14: qty 2

## 2015-01-14 MED ORDER — CLONAZEPAM 1 MG PO TABS
1.0000 mg | ORAL_TABLET | Freq: Every evening | ORAL | Status: DC | PRN
  Filled 2015-01-14: qty 1

## 2015-01-14 MED ORDER — ONDANSETRON HCL 4 MG/2ML IJ SOLN
4.0000 mg | Freq: Four times a day (QID) | INTRAMUSCULAR | Status: DC | PRN
  Administered 2015-01-14 – 2015-01-15 (×2): 4 mg via INTRAVENOUS
  Filled 2015-01-14 (×2): qty 2

## 2015-01-14 MED ORDER — SODIUM CHLORIDE 0.9 % IJ SOLN
INTRAMUSCULAR | Status: AC
  Filled 2015-01-14: qty 20

## 2015-01-14 MED ORDER — ONDANSETRON HCL 4 MG/2ML IJ SOLN
INTRAMUSCULAR | Status: DC | PRN
  Administered 2015-01-14: 4 mg via INTRAVENOUS

## 2015-01-14 MED ORDER — HYDROMORPHONE HCL 1 MG/ML IJ SOLN
0.2500 mg | INTRAMUSCULAR | Status: DC | PRN
  Administered 2015-01-14 (×4): 0.5 mg via INTRAVENOUS

## 2015-01-14 MED ORDER — BUPIVACAINE-EPINEPHRINE 0.25% -1:200000 IJ SOLN
INTRAMUSCULAR | Status: DC | PRN
  Administered 2015-01-14: 50 mL

## 2015-01-14 MED ORDER — MORPHINE SULFATE (PF) 2 MG/ML IV SOLN
1.0000 mg | INTRAVENOUS | Status: DC | PRN
  Administered 2015-01-14 (×2): 1 mg via INTRAVENOUS
  Filled 2015-01-14 (×2): qty 1

## 2015-01-14 MED ORDER — IOHEXOL 300 MG/ML  SOLN
INTRAMUSCULAR | Status: DC | PRN
  Administered 2015-01-14: 50 mL via INTRAVENOUS

## 2015-01-14 MED ORDER — BUPIVACAINE LIPOSOME 1.3 % IJ SUSP
20.0000 mL | Freq: Once | INTRAMUSCULAR | Status: AC
  Administered 2015-01-14: 20 mL
  Filled 2015-01-14: qty 20

## 2015-01-14 MED ORDER — CLONAZEPAM 1 MG PO TABS
1.0000 mg | ORAL_TABLET | Freq: Two times a day (BID) | ORAL | Status: DC
  Administered 2015-01-14 – 2015-01-15 (×3): 1 mg via ORAL
  Filled 2015-01-14 (×3): qty 1

## 2015-01-14 MED ORDER — ROCURONIUM BROMIDE 100 MG/10ML IV SOLN
INTRAVENOUS | Status: DC | PRN
  Administered 2015-01-14: 50 mg via INTRAVENOUS

## 2015-01-14 MED ORDER — LACTATED RINGERS IR SOLN
Status: DC | PRN
  Administered 2015-01-14: 1000 mL

## 2015-01-14 MED ORDER — BUPIVACAINE-EPINEPHRINE (PF) 0.25% -1:200000 IJ SOLN
INTRAMUSCULAR | Status: AC
  Filled 2015-01-14: qty 30

## 2015-01-14 MED ORDER — NEOSTIGMINE METHYLSULFATE 10 MG/10ML IV SOLN
INTRAVENOUS | Status: DC | PRN
  Administered 2015-01-14: 3 mg via INTRAVENOUS

## 2015-01-14 MED ORDER — BUPROPION HCL ER (SR) 100 MG PO TB12
200.0000 mg | ORAL_TABLET | Freq: Two times a day (BID) | ORAL | Status: DC
  Administered 2015-01-14 – 2015-01-15 (×2): 200 mg via ORAL
  Filled 2015-01-14 (×3): qty 2

## 2015-01-14 MED ORDER — FENTANYL CITRATE (PF) 250 MCG/5ML IJ SOLN
INTRAMUSCULAR | Status: DC | PRN
  Administered 2015-01-14 (×4): 50 ug via INTRAVENOUS

## 2015-01-14 MED ORDER — CEFAZOLIN SODIUM-DEXTROSE 2-3 GM-% IV SOLR
2.0000 g | INTRAVENOUS | Status: AC
  Administered 2015-01-14: 2 g via INTRAVENOUS

## 2015-01-14 MED ORDER — LACTATED RINGERS IV SOLN: INTRAVENOUS | Status: DC

## 2015-01-14 SURGICAL SUPPLY — 43 items
APL SKNCLS STERI-STRIP NONHPOA (GAUZE/BANDAGES/DRESSINGS)
APL SRG 38 LTWT LNG FL B (MISCELLANEOUS) ×1
APPLICATOR ARISTA FLEXITIP XL (MISCELLANEOUS) ×1 IMPLANT
APPLIER CLIP ROT 10 11.4 M/L (STAPLE) ×2
APR CLP MED LRG 11.4X10 (STAPLE) ×1
BAG SPEC RTRVL 10 TROC 200 (ENDOMECHANICALS)
BENZOIN TINCTURE PRP APPL 2/3 (GAUZE/BANDAGES/DRESSINGS) IMPLANT
CABLE HIGH FREQUENCY MONO STRZ (ELECTRODE) IMPLANT
CATH REDDICK CHOLANGI 4FR 50CM (CATHETERS) ×2 IMPLANT
CLIP APPLIE ROT 10 11.4 M/L (STAPLE) ×1 IMPLANT
COVER MAYO STAND STRL (DRAPES) ×2 IMPLANT
COVER SURGICAL LIGHT HANDLE (MISCELLANEOUS) ×2 IMPLANT
DECANTER SPIKE VIAL GLASS SM (MISCELLANEOUS) ×2 IMPLANT
DRAPE C-ARM 42X120 X-RAY (DRAPES) ×2 IMPLANT
DRAPE LAPAROSCOPIC ABDOMINAL (DRAPES) ×2 IMPLANT
ELECT L-HOOK LAP 45CM DISP (ELECTROSURGICAL)
ELECT PENCIL ROCKER SW 15FT (MISCELLANEOUS) ×2 IMPLANT
ELECT REM PT RETURN 9FT ADLT (ELECTROSURGICAL) ×2
ELECTRODE L-HOOK LAP 45CM DISP (ELECTROSURGICAL) IMPLANT
ELECTRODE REM PT RTRN 9FT ADLT (ELECTROSURGICAL) ×1 IMPLANT
GLOVE BIOGEL M 8.0 STRL (GLOVE) ×2 IMPLANT
GOWN STRL REUS W/TWL XL LVL3 (GOWN DISPOSABLE) ×9 IMPLANT
HEMOSTAT ARISTA ABSORB 3G PWDR (MISCELLANEOUS) ×1 IMPLANT
HEMOSTAT SURGICEL 4X8 (HEMOSTASIS) IMPLANT
IV CATH 14GX2 1/4 (CATHETERS) ×2 IMPLANT
KIT BASIN OR (CUSTOM PROCEDURE TRAY) ×2 IMPLANT
L-HOOK LAP DISP 36CM (ELECTROSURGICAL) ×2
LHOOK LAP DISP 36CM (ELECTROSURGICAL) IMPLANT
LIQUID BAND (GAUZE/BANDAGES/DRESSINGS) ×2 IMPLANT
POUCH RETRIEVAL ECOSAC 10 (ENDOMECHANICALS) IMPLANT
POUCH RETRIEVAL ECOSAC 10MM (ENDOMECHANICALS)
SCISSORS LAP 5X45 EPIX DISP (ENDOMECHANICALS) ×2 IMPLANT
SCRUB PCMX 4 OZ (MISCELLANEOUS) ×2 IMPLANT
SET IRRIG TUBING LAPAROSCOPIC (IRRIGATION / IRRIGATOR) ×2 IMPLANT
SLEEVE XCEL OPT CAN 5 100 (ENDOMECHANICALS) ×4 IMPLANT
STRIP CLOSURE SKIN 1/2X4 (GAUZE/BANDAGES/DRESSINGS) IMPLANT
SUT VIC AB 4-0 SH 18 (SUTURE) ×2 IMPLANT
SYR 20CC LL (SYRINGE) ×2 IMPLANT
TOWEL OR 17X26 10 PK STRL BLUE (TOWEL DISPOSABLE) ×2 IMPLANT
TRAY LAPAROSCOPIC (CUSTOM PROCEDURE TRAY) ×2 IMPLANT
TROCAR BLADELESS OPT 5 100 (ENDOMECHANICALS) ×2 IMPLANT
TROCAR XCEL BLUNT TIP 100MML (ENDOMECHANICALS) IMPLANT
TROCAR XCEL NON-BLD 11X100MML (ENDOMECHANICALS) ×2 IMPLANT

## 2015-01-14 NOTE — Op Note (Signed)
WALBURGA PAWSON   01/14/2015  3:54 PM  Procedure: Laparoscopic Cholecystectomy with intraoperative cholangiogram  Surgeon: Catalina Antigua B. Hassell Done, MD, FACS Asst:  Leighton Ruff, MD, FACS  Anes:  General  Drains:  None  Findings: Normal IOC  Description of Procedure: The patient was taken to OR 2 and given general anesthesia.  The patient was prepped with PCMX and draped sterilely. A time out was performed.  Access to the abdomen was achieved with a 5 mm Optiview through the right upper quadrant.  Port placement included three 5 mm trocars and one 11 in the upper midline place obliquely.    The gallbladder was visualized and the fundus was grasped and the gallbladder was elevated. Traction on the infundibulum allowed for successful demonstration of the critical view. Inflammatory changes were chronic and minimal.  The Calot's triangle was enshrouded with fatty tissue.  The cystic duct was identified and clipped up on the gallbladder and an incision was made in the cystic duct and the Reddick catheter was inserted after milking the cystic duct of any debris. A dynamic cholangiogram was performed which demonstrated intrahepatic filling and free flow into the duodenum.    The cystic duct was then triple clipped and divided, the cystic artery was double clipped and divided and then the gallbladder was removed from the gallbladder bed. Removal of the gallbladder from the gallbladder bed was performed with the electrocautery.  The gallbladder was then placed in a bag and brought out through one of the trocar sites. The gallbladder bed was inspected and no bleeding or bile leaks were seen.  Arista was placed in the gallbladder bed to cover raw areas.   Incisions were injected with Exparel and closed with 4-0 Vicryl and Liquiban on the skin.  Sponge and needle count were correct.    The patient was taken to the recovery room in satisfactory condition.

## 2015-01-14 NOTE — Interval H&P Note (Signed)
History and Physical Interval Note:  01/14/2015 1:44 PM  Erica Drake  has presented today for surgery, with the diagnosis of Billary Dykinesia  The various methods of treatment have been discussed with the patient and family. After consideration of risks, benefits and other options for treatment, the patient has consented to  Procedure(s): LAPAROSCOPIC CHOLECYSTECTOMY WITH INTRAOPERATIVE CHOLANGIOGRAM (N/A) as a surgical intervention .  The patient's history has been reviewed, patient examined, no change in status, stable for surgery.  I have reviewed the patient's chart and labs.  Questions were answered to the patient's satisfaction.     Claude Waldman B

## 2015-01-14 NOTE — Transfer of Care (Signed)
Immediate Anesthesia Transfer of Care Note  Patient: Erica Drake  Procedure(s) Performed: Procedure(s): LAPAROSCOPIC CHOLECYSTECTOMY WITH INTRAOPERATIVE CHOLANGIOGRAM (N/A)  Patient Location: PACU  Anesthesia Type:General  Level of Consciousness:  sedated, patient cooperative and responds to stimulation  Airway & Oxygen Therapy:Patient Spontanous Breathing and Patient connected to face mask oxgen  Post-op Assessment:  Report given to PACU RN and Post -op Vital signs reviewed and stable  Post vital signs:  Reviewed and stable  Last Vitals:  Filed Vitals:   01/14/15 1219  BP: 128/73  Pulse: 101  Temp: 37 C  Resp: 16    Complications: No apparent anesthesia complications

## 2015-01-14 NOTE — Anesthesia Preprocedure Evaluation (Addendum)
Anesthesia Evaluation  Patient identified by MRN, date of birth, ID band Patient awake    Reviewed: Allergy & Precautions, H&P , NPO status , Patient's Chart, lab work & pertinent test results  Airway Mallampati: II  TM Distance: >3 FB Neck ROM: full    Dental  (+) Dental Advisory Given, Caps All of upper front are capped:   Pulmonary asthma , former smoker,  Severe asthma   Pulmonary exam normal breath sounds clear to auscultation       Cardiovascular Exercise Tolerance: Good negative cardio ROS Normal cardiovascular exam Rhythm:regular Rate:Normal     Neuro/Psych negative neurological ROS  negative psych ROS   GI/Hepatic negative GI ROS, Neg liver ROS, GERD  Medicated and Controlled,  Endo/Other  negative endocrine ROS  Renal/GU negative Renal ROS  negative genitourinary   Musculoskeletal   Abdominal   Peds  Hematology negative hematology ROS (+)   Anesthesia Other Findings   Reproductive/Obstetrics negative OB ROS                            Anesthesia Physical Anesthesia Plan  ASA: III  Anesthesia Plan: General   Post-op Pain Management:    Induction: Intravenous  Airway Management Planned: Oral ETT  Additional Equipment:   Intra-op Plan:   Post-operative Plan: Extubation in OR  Informed Consent: I have reviewed the patients History and Physical, chart, labs and discussed the procedure including the risks, benefits and alternatives for the proposed anesthesia with the patient or authorized representative who has indicated his/her understanding and acceptance.   Dental Advisory Given  Plan Discussed with: CRNA and Surgeon  Anesthesia Plan Comments:         Anesthesia Quick Evaluation

## 2015-01-14 NOTE — H&P (View-Only) (Signed)
Erica Drake 12/23/2014 1:35 PM Location: [facility] Patient #: [id number] DOB: 02/11/1936 Married / Language: English / Race: White Female  History of Present Illness (Erica Drake B. Agron Swiney MD; 12/23/2014 2:00 PM) The patient is a 79 year old female who presents for evaluation of gallbladder disease. Her HIDA scan showed a 12% ejection fraction. She's been having right upper quadrant pain and pain in her back. She also has fibromyalgia and interstitial cystitis. I also had previously done a lap Nissen on her.  I explained laparoscopic cholecystectomy to her and gave her a booklet. Offered the complications not limited to common duct injury bleeding bowel leaks and she is aware of these. She wants going get this scheduled as soon as possible.   Allergies (Erica Drake, CMA; 12/23/2014 1:35 PM) Crestor *ANTIHYPERLIPIDEMICS* Niaspan *ANTIHYPERLIPIDEMICS* Sulfa 10 *OPHTHALMIC AGENTS* Codeine Sulfate *ANALGESICS - OPIOID* Cymbalta *ANTIDEPRESSANTS* Diovan *ANTIHYPERTENSIVES* AmLODIPine Besylate *CALCIUM CHANNEL BLOCKERS* Cough. Cortisone *CORTICOSTEROIDS* Omnipaque *DIAGNOSTIC PRODUCTS* Anaphylaxis, Swelling. Dexamethasone (Ophth) *OPHTHALMIC AGENTS* Anaphylaxis.  Medication History (Erica Drake, CMA; 12/23/2014 1:35 PM) Wellbutrin SR (200MG Tablet ER 12HR, Oral two times daily) Active. ClonazePAM (1MG Tablet, Oral two times daily) Active. PriLOSEC (20MG Capsule DR, Oral) Active. Dexilant (60MG Capsule DR, Oral) Active. Sulfamethoxazole-Trimethoprim (800-160MG Tablet, Oral) Active. Medications Reconciled    Vitals (Erica Drake CMA; 12/23/2014 1:36 PM) 12/23/2014 1:35 PM Weight: 166.6 lb Height: 65in Body Surface Area: 1.83 m Body Mass Index: 27.72 kg/m  Temp.: 97.8F(Temporal)  Pulse: 70 (Regular)  BP: 130/70 (Sitting, Left Arm, Standard)      Physical Exam (Nason Conradt B. Alexie Samson MD; 12/23/2014 2:01 PM)  General Note: Head  normocephalic Eyes sclerae are nonicteric pupils are equal round and reactive to light. Nose and throat exam unremarkable. Neck supple without masses. Chest clear to auscultation Heart sinus rhythm without murmurs or gallops Abdomen soreness in the right upper quadrant with radiation to the back. Extremity exam arthritis pain with fibromyalgia. Neuro alert and oriented 3. Emergency function is grossly intact    Assessment & Plan (Mekel Haverstock B. Tonnie Friedel MD; 12/23/2014 2:02 PM)  BILIARY DYSKINESIA (K82.8) Impression: HIDA + for low ejection fraction. Will schedule lap chole  

## 2015-01-14 NOTE — Anesthesia Procedure Notes (Signed)
Procedure Name: Intubation Date/Time: 01/14/2015 2:42 PM Performed by: Chandra Batch A Pre-anesthesia Checklist: Patient identified, Timeout performed, Emergency Drugs available, Suction available and Patient being monitored Patient Re-evaluated:Patient Re-evaluated prior to inductionOxygen Delivery Method: Circle system utilized Preoxygenation: Pre-oxygenation with 100% oxygen Intubation Type: IV induction Ventilation: Mask ventilation without difficulty Laryngoscope Size: Mac and 4 Grade View: Grade III Tube type: Oral Tube size: 7.0 mm Number of attempts: 2 Airway Equipment and Method: Bougie stylet Placement Confirmation: ETT inserted through vocal cords under direct vision,  breath sounds checked- equal and bilateral and positive ETCO2 Secured at: 22 cm Tube secured with: Tape Dental Injury: Teeth and Oropharynx as per pre-operative assessment

## 2015-01-14 NOTE — Anesthesia Postprocedure Evaluation (Signed)
  Anesthesia Post-op Note  Patient: Erica Drake  Procedure(s) Performed: Procedure(s) (LRB): LAPAROSCOPIC CHOLECYSTECTOMY WITH INTRAOPERATIVE CHOLANGIOGRAM (N/A)  Patient Location: PACU  Anesthesia Type: General  Level of Consciousness: awake and alert   Airway and Oxygen Therapy: Patient Spontanous Breathing  Post-op Pain: mild  Post-op Assessment: Post-op Vital signs reviewed, Patient's Cardiovascular Status Stable, Respiratory Function Stable, Patent Airway and No signs of Nausea or vomiting  Last Vitals:  Filed Vitals:   01/14/15 1722  BP: 150/71  Pulse: 82  Temp: 36.4 C  Resp: 15    Post-op Vital Signs: stable   Complications: No apparent anesthesia complications

## 2015-01-15 DIAGNOSIS — J45909 Unspecified asthma, uncomplicated: Secondary | ICD-10-CM | POA: Diagnosis not present

## 2015-01-15 DIAGNOSIS — K219 Gastro-esophageal reflux disease without esophagitis: Secondary | ICD-10-CM | POA: Diagnosis not present

## 2015-01-15 DIAGNOSIS — Z882 Allergy status to sulfonamides status: Secondary | ICD-10-CM | POA: Diagnosis not present

## 2015-01-15 DIAGNOSIS — M797 Fibromyalgia: Secondary | ICD-10-CM | POA: Diagnosis not present

## 2015-01-15 DIAGNOSIS — Z888 Allergy status to other drugs, medicaments and biological substances status: Secondary | ICD-10-CM | POA: Diagnosis not present

## 2015-01-15 DIAGNOSIS — K8010 Calculus of gallbladder with chronic cholecystitis without obstruction: Secondary | ICD-10-CM | POA: Diagnosis not present

## 2015-01-15 MED ORDER — HYDROCODONE-ACETAMINOPHEN 5-325 MG PO TABS: 1.0000 | ORAL_TABLET | ORAL | Status: DC | PRN

## 2015-01-15 MED ORDER — TIOTROPIUM BROMIDE MONOHYDRATE 2.5 MCG/ACT IN AERS: 2.0000 | INHALATION_SPRAY | Freq: Every day | RESPIRATORY_TRACT | Status: DC | PRN

## 2015-01-15 NOTE — Discharge Summary (Signed)
Physician Discharge Summary  Erica Drake [id number] DOB: December 20, 1935 DOA: 01/14/2015  PCP: Irven Shelling, MD  Admit date: 01/14/2015 Discharge date: 01/15/2015  Recommendations for Outpatient Follow-up:   Follow-up Information    Follow up with [name], MD. Schedule an appointment as soon as possible for a visit in 3 weeks.   Specialty:  General Surgery   Why:  For wound re-check   Contact information:   [facility] [place] [address] [phone]      Discharge Diagnoses:  Principal Problem:   Chronic cholecystitis Active Problems:   Allergic rhinitis   Fibromyalgia   Surgical Procedure: laparoscopic cholecystectomywith ioc by Dr Hassell Done 01/14/15  Discharge Condition: good Disposition: home  Diet recommendation: regular  Filed Weights   01/14/15 1200  Weight: 74.844 kg (165 lb)     Hospital Course:  Came in for planned cholecystectomy. See op note. Did well. On afternoon of POD 1, she was ambulating in halls. Her vitals were stable. She was tolerating liquids. She was tolerating oral pain medications. I spoke with her nurse in the afternoon after I rounded. I had discussed dc instructions with her this morning   Discharge Instructions      Discharge Instructions    Call MD for:  persistant nausea and vomiting    Complete by:  As directed      Call MD for:  redness, tenderness, or signs of infection (pain, swelling, redness, odor or green/yellow discharge around incision site)    Complete by:  As directed      Call MD for:  severe uncontrolled pain    Complete by:  As directed      Call MD for:    Complete by:  As directed   Temp>101     Diet - low sodium heart healthy    Complete by:  As directed      Discharge instructions    Complete by:  As directed   See [facility] discharge instructions     Increase activity slowly    Complete by:  As directed             Medication List    TAKE these medications        albuterol 108 (90 BASE) MCG/ACT inhaler  Commonly known as:  PROVENTIL HFA;VENTOLIN HFA  Inhale 2 puffs into the lungs every 6 (six) hours as needed for wheezing or shortness of breath.     bismuth subsalicylate 99991111 [id number] suspension  Commonly known as:  PEPTO BISMOL  Take 15 mLs by mouth at bedtime.     buPROPion 200 MG 12 hr tablet  Commonly known as:  WELLBUTRIN SR  Take 200 mg by mouth 2 (two) times daily.     clonazePAM 1 MG disintegrating tablet  Commonly known as:  KLONOPIN  Take 1 mg by mouth at bedtime as needed (leg cramping and twitching).     clonazePAM 1 MG tablet  Commonly known as:  KLONOPIN  Take 1 mg by mouth 2 (two) times daily with a meal.     DEXILANT 60 MG capsule  Generic drug:  dexlansoprazole  Take 60 mg by mouth daily.     famotidine 10 MG tablet  Commonly known as:  PEPCID  Take 10 mg by mouth daily as needed for heartburn or indigestion.     HYDROcodone-acetaminophen 5-325 MG tablet  Commonly known as:  NORCO/VICODIN  Take 1-2 tablets by mouth every 4 (four) hours as needed for moderate  pain.     Tiotropium Bromide Monohydrate 2.5 MCG/ACT Aers  Commonly known as:  SPIRIVA RESPIMAT  Inhale 2 puffs into the lungs daily as needed (allergies).       Follow-up Information    Follow up with [name], MD. Schedule an appointment as soon as possible for a visit in 3 weeks.   Specialty:  General Surgery   Why:  For wound re-check   Contact information:   [place] [address] [phone]        The results of significant diagnostics from this hospitalization (including imaging, microbiology, ancillary and laboratory) are listed below for reference.    Significant Diagnostic Studies: Dg Cholangiogram Operative  01/14/2015  CLINICAL DATA:  Intraoperative cholangiogram during laparoscopic cholecystectomy. EXAM: INTRAOPERATIVE CHOLANGIOGRAM FLUOROSCOPY TIME:  17 seconds (4.7 mGy) COMPARISON:  Nuclear medicine HIDA  scan - 12/16/2014; abdominal ultrasound - 11/16/2024 FINDINGS: A single spot intraoperative cholangiographic image of the right upper abdominal quadrant during laparoscopic cholecystectomy are provided for review. Surgical clips overlie the expected location of the gallbladder fossa. Contrast injection demonstrates selective cannulation of the central aspect of the cystic duct. There is passage of contrast through the central aspect of the cystic duct with filling of a non dilated common bile duct. There is passage of contrast though the CBD and into the descending portion of the duodenum. There is minimal reflux of injected contrast into the common hepatic duct and central aspect of the non dilated intrahepatic biliary system. There are no discrete filling defects within the opacified portions of the biliary system to suggest the presence of choledocholithiasis. IMPRESSION: No evidence of choledocholithiasis. Electronically Signed   By: Sandi Mariscal M.D.   On: 01/14/2015 15:50    Microbiology: No results found for this or any previous visit (from the past 240 hour(s)).   Labs: Basic Metabolic Panel:  Recent Labs Lab 01/14/15 1858  CREATININE 1.11*   Liver Function Tests: No results for input(s): AST, ALT, ALKPHOS, BILITOT, PROT, ALBUMIN in the last 168 hours. No results for input(s): LIPASE, AMYLASE in the last 168 hours. No results for input(s): AMMONIA in the last 168 hours. CBC:  Recent Labs Lab 01/10/15 1345 01/14/15 1858  WBC 7.7 11.9*  HGB 14.0 13.5  HCT 42.9 41.8  MCV 90.5 91.7  PLT 342 334   Cardiac Enzymes: No results for input(s): CKTOTAL, CKMB, CKMBINDEX, TROPONINI in the last 168 hours. BNP: BNP (last 3 results) No results for input(s): BNP in the last 8760 hours.  ProBNP (last 3 results) No results for input(s): PROBNP in the last 8760 hours.  CBG: No results for input(s): GLUCAP in the last 168 hours.  Principal Problem:   Chronic cholecystitis Active  Problems:   Allergic rhinitis   Fibromyalgia   Time coordinating discharge: 10 min  Signed:  Gayland Curry, MD [facility], [place] [phone] 01/15/2015, 5:09 PM

## 2015-01-15 NOTE — Care Management Obs Status (Signed)
MEDICARE OBSERVATION STATUS NOTIFICATION   Patient Details  Name: Erica Drake MRN: [id number] Date of Birth: 26-Mar-1935   Medicare Observation Status Notification Given:  Yes    Apolonio Schneiders, RN 01/15/2015, 9:19 AM

## 2015-01-15 NOTE — Progress Notes (Signed)
Discharged from floor via w/c, daughter & belongings with pt. No changes in assessment. Liyana Suniga  

## 2015-01-15 NOTE — Discharge Instructions (Signed)
[facility] °LAPAROSCOPIC SURGERY: POST OP INSTRUCTIONS °Always review your discharge instruction sheet given to you by the facility where your surgery was performed. °IF YOU HAVE DISABILITY OR FAMILY LEAVE FORMS, YOU MUST BRING THEM TO THE OFFICE FOR PROCESSING.   °DO NOT GIVE THEM TO YOUR DOCTOR. ° °1. A prescription for pain medication may be given to you upon discharge.  Take your pain medication as prescribed, if needed.  If narcotic pain medicine is not needed, then you may take acetaminophen (Tylenol) or ibuprofen (Advil) as needed. °2. Take your usually prescribed medications unless otherwise directed. °3. If you need a refill on your pain medication, please contact your pharmacy.  They will contact our office to request authorization. Prescriptions will not be filled after 5pm or on week-ends. °4. You should follow a light diet the first few days after arrival home, such as soup and crackers, etc.  Be sure to include lots of fluids daily. °5. Most patients will experience some swelling and bruising in the area of the incisions.  Ice packs will help.  Swelling and bruising can take several days to resolve.  °6. It is common to experience some constipation if taking pain medication after surgery.  Increasing fluid intake and taking a stool softener (such as Colace) will usually help or prevent this problem from occurring.  A mild laxative (Milk of Magnesia or Miralax) should be taken according to package instructions if there are no bowel movements after 48 hours. °7. Unless discharge instructions indicate otherwise, you may remove your bandages 24-48 hours after surgery, and you may shower at that time.  You may have steri-strips (small skin tapes) in place directly over the incision.  These strips should be left on the skin for 7-10 days.  If your surgeon used skin glue on the incision, you may shower in 24 hours.  The glue will flake off over the next 2-3 weeks.  Any sutures or  staples will be removed at the office during your follow-up visit. °8. ACTIVITIES:  You may resume regular (light) daily activities beginning the next day--such as daily self-care, walking, climbing stairs--gradually increasing activities as tolerated.  You may have sexual intercourse when it is comfortable.  Refrain from any heavy lifting or straining until approved by your doctor. °a. You may drive when you are no longer taking prescription pain medication, you can comfortably wear a seatbelt, and you can safely maneuver your car and apply brakes. °9. You should see your doctor in the office for a follow-up appointment approximately 2-3 weeks after your surgery.  Make sure that you call for this appointment within a day or two after you arrive home to insure a convenient appointment time. °10. OTHER INSTRUCTIONS:  °WHEN TO CALL YOUR DOCTOR: °1. Fever over 101.0 °2. Inability to urinate °3. Continued bleeding from incision. °4. Increased pain, redness, or drainage from the incision. °5. Increasing abdominal pain ° °The clinic staff is available to answer your questions during regular business hours.  Please don’t hesitate to call and ask to speak to one of the nurses for clinical concerns.  If you have a medical emergency, go to the nearest emergency room or call 911.  A surgeon from [facility] is always on call at the hospital. °[address], [address], [place], [place]  [address] ? [address] [address], [place], [place]   [address] °[phone] ? [phone] ? FAX [phone] °Web site: [email or web address] ° °

## 2015-01-15 NOTE — Progress Notes (Signed)
1 Day Post-Op  Subjective: Had some nausea last night. Has only had water so far. Ordering breakfast.   Objective: Vital signs in last 24 hours: Temp:  [96.3 F (35.7 C)-101.1 F (38.4 C)] 98.5 F (36.9 C) (11/19 0719) Pulse Rate:  [80-110] 109 (11/19 0719) Resp:  [11-23] 18 (11/19 0719) BP: (113-173)/(52-133) 120/57 mmHg (11/19 0719) SpO2:  [91 %-100 %] 96 % (11/19 0719) Weight:  [74.844 kg (165 lb)] 74.844 kg (165 lb) (11/18 1200) Last BM Date: 01/14/15  Intake/Output from previous day: 11/18 0701 - 11/19 0700 In: 2483.3 [P.O.:460; I.V.:2023.3] Out: 175 [Urine:175] Intake/Output this shift:    Alert, nontoxic cta Reg Soft, mild approp TTP, incisions c/d/i  Lab Results:   Recent Labs  01/14/15 1858  WBC 11.9*  HGB 13.5  HCT 41.8  PLT 334   BMET  Recent Labs  01/14/15 1858  CREATININE 1.11*   PT/INR No results for input(s): LABPROT, INR in the last 72 hours. ABG No results for input(s): PHART, HCO3 in the last 72 hours.  Invalid input(s): PCO2, PO2  Studies/Results: Dg Cholangiogram Operative  01/14/2015  CLINICAL DATA:  Intraoperative cholangiogram during laparoscopic cholecystectomy. EXAM: INTRAOPERATIVE CHOLANGIOGRAM FLUOROSCOPY TIME:  17 seconds (4.7 mGy) COMPARISON:  Nuclear medicine HIDA scan - 12/16/2014; abdominal ultrasound - 11/16/2024 FINDINGS: A single spot intraoperative cholangiographic image of the right upper abdominal quadrant during laparoscopic cholecystectomy are provided for review. Surgical clips overlie the expected location of the gallbladder fossa. Contrast injection demonstrates selective cannulation of the central aspect of the cystic duct. There is passage of contrast through the central aspect of the cystic duct with filling of a non dilated common bile duct. There is passage of contrast though the CBD and into the descending portion of the duodenum. There is minimal reflux of injected contrast into the common hepatic duct and  central aspect of the non dilated intrahepatic biliary system. There are no discrete filling defects within the opacified portions of the biliary system to suggest the presence of choledocholithiasis. IMPRESSION: No evidence of choledocholithiasis. Electronically Signed   By: Sandi Mariscal M.D.   On: 01/14/2015 15:50    Anti-infectives: Anti-infectives    Start     Dose/Rate Route Frequency Ordered Stop   01/14/15 1154  ceFAZolin (ANCEF) IVPB 2 g/50 mL premix     2 g 100 mL/hr over 30 Minutes Intravenous On call to O.R. 01/14/15 1154 01/14/15 1444      Assessment/Plan: s/p Procedure(s): LAPAROSCOPIC CHOLECYSTECTOMY WITH INTRAOPERATIVE CHOLANGIOGRAM (N/A)  Looks good Start with clears for breakfast, adv diet for lunch If tolerates lunch anticipate d/c this afternoon Discussed dc instructions.  F/u office 2-3 weeks  Leighton Ruff. Redmond Pulling, MD, FACS General, Bariatric, & Minimally Invasive Surgery [facility], [place]      [facility] Jerilynn Mages 01/15/2015

## 2015-01-17 ENCOUNTER — Encounter (HOSPITAL_COMMUNITY): Payer: Self-pay | Admitting: Surgery

## 2015-02-07 DIAGNOSIS — N3010 Interstitial cystitis (chronic) without hematuria: Secondary | ICD-10-CM | POA: Diagnosis not present

## 2015-02-10 DIAGNOSIS — N3010 Interstitial cystitis (chronic) without hematuria: Secondary | ICD-10-CM | POA: Diagnosis not present

## 2015-02-15 DIAGNOSIS — Z6826 Body mass index (BMI) 26.0-26.9, adult: Secondary | ICD-10-CM | POA: Diagnosis not present

## 2015-02-15 DIAGNOSIS — M5440 Lumbago with sciatica, unspecified side: Secondary | ICD-10-CM | POA: Diagnosis not present

## 2015-02-15 DIAGNOSIS — M4726 Other spondylosis with radiculopathy, lumbar region: Secondary | ICD-10-CM | POA: Diagnosis not present

## 2015-02-15 DIAGNOSIS — M5136 Other intervertebral disc degeneration, lumbar region: Secondary | ICD-10-CM | POA: Diagnosis not present

## 2015-02-15 DIAGNOSIS — M549 Dorsalgia, unspecified: Secondary | ICD-10-CM | POA: Diagnosis not present

## 2015-02-22 DIAGNOSIS — N183 Chronic kidney disease, stage 3 (moderate): Secondary | ICD-10-CM | POA: Diagnosis not present

## 2015-02-22 DIAGNOSIS — M179 Osteoarthritis of knee, unspecified: Secondary | ICD-10-CM | POA: Diagnosis not present

## 2015-02-22 DIAGNOSIS — E119 Type 2 diabetes mellitus without complications: Secondary | ICD-10-CM | POA: Diagnosis not present

## 2015-02-22 DIAGNOSIS — I129 Hypertensive chronic kidney disease with stage 1 through stage 4 chronic kidney disease, or unspecified chronic kidney disease: Secondary | ICD-10-CM | POA: Diagnosis not present

## 2015-02-22 DIAGNOSIS — K828 Other specified diseases of gallbladder: Secondary | ICD-10-CM | POA: Diagnosis not present

## 2015-02-22 DIAGNOSIS — N3010 Interstitial cystitis (chronic) without hematuria: Secondary | ICD-10-CM | POA: Diagnosis not present

## 2015-03-01 DIAGNOSIS — N3010 Interstitial cystitis (chronic) without hematuria: Secondary | ICD-10-CM | POA: Diagnosis not present

## 2015-03-02 ENCOUNTER — Other Ambulatory Visit: Payer: Self-pay | Admitting: Gastroenterology

## 2015-03-02 DIAGNOSIS — M5126 Other intervertebral disc displacement, lumbar region: Secondary | ICD-10-CM | POA: Diagnosis not present

## 2015-03-02 DIAGNOSIS — M4726 Other spondylosis with radiculopathy, lumbar region: Secondary | ICD-10-CM | POA: Diagnosis not present

## 2015-03-02 DIAGNOSIS — R1084 Generalized abdominal pain: Secondary | ICD-10-CM

## 2015-03-02 DIAGNOSIS — R1013 Epigastric pain: Secondary | ICD-10-CM | POA: Diagnosis not present

## 2015-03-07 ENCOUNTER — Ambulatory Visit
Admission: RE | Admit: 2015-03-07 | Discharge: 2015-03-07 | Disposition: A | Discharge status: Home / Self Care | Payer: Medicare Other | Source: Ambulatory Visit | Attending: Gastroenterology | Admitting: Gastroenterology

## 2015-03-07 DIAGNOSIS — R1084 Generalized abdominal pain: Secondary | ICD-10-CM

## 2015-03-07 MED ORDER — IOPAMIDOL (ISOVUE-300) INJECTION 61%
100.0000 mL | Freq: Once | INTRAVENOUS | Status: AC | PRN
  Administered 2015-03-07: 100 mL via INTRAVENOUS

## 2015-03-08 DIAGNOSIS — N3010 Interstitial cystitis (chronic) without hematuria: Secondary | ICD-10-CM | POA: Diagnosis not present

## 2015-03-15 DIAGNOSIS — N3010 Interstitial cystitis (chronic) without hematuria: Secondary | ICD-10-CM | POA: Diagnosis not present

## 2015-03-17 DIAGNOSIS — F341 Dysthymic disorder: Secondary | ICD-10-CM | POA: Diagnosis not present

## 2015-03-17 DIAGNOSIS — N3010 Interstitial cystitis (chronic) without hematuria: Secondary | ICD-10-CM | POA: Diagnosis not present

## 2015-03-18 DIAGNOSIS — M5440 Lumbago with sciatica, unspecified side: Secondary | ICD-10-CM | POA: Diagnosis not present

## 2015-03-18 DIAGNOSIS — M5136 Other intervertebral disc degeneration, lumbar region: Secondary | ICD-10-CM | POA: Diagnosis not present

## 2015-03-18 DIAGNOSIS — M4726 Other spondylosis with radiculopathy, lumbar region: Secondary | ICD-10-CM | POA: Diagnosis not present

## 2015-03-22 DIAGNOSIS — N3010 Interstitial cystitis (chronic) without hematuria: Secondary | ICD-10-CM | POA: Diagnosis not present

## 2015-03-29 DIAGNOSIS — N3010 Interstitial cystitis (chronic) without hematuria: Secondary | ICD-10-CM | POA: Diagnosis not present

## 2015-04-07 DIAGNOSIS — N3010 Interstitial cystitis (chronic) without hematuria: Secondary | ICD-10-CM | POA: Diagnosis not present

## 2015-04-12 DIAGNOSIS — N3010 Interstitial cystitis (chronic) without hematuria: Secondary | ICD-10-CM | POA: Diagnosis not present

## 2015-04-14 DIAGNOSIS — N183 Chronic kidney disease, stage 3 (moderate): Secondary | ICD-10-CM | POA: Diagnosis not present

## 2015-04-14 DIAGNOSIS — K219 Gastro-esophageal reflux disease without esophagitis: Secondary | ICD-10-CM | POA: Diagnosis not present

## 2015-04-14 DIAGNOSIS — N3011 Interstitial cystitis (chronic) with hematuria: Secondary | ICD-10-CM | POA: Diagnosis not present

## 2015-04-14 DIAGNOSIS — K580 Irritable bowel syndrome with diarrhea: Secondary | ICD-10-CM | POA: Diagnosis not present

## 2015-04-15 DIAGNOSIS — M5136 Other intervertebral disc degeneration, lumbar region: Secondary | ICD-10-CM | POA: Diagnosis not present

## 2015-04-15 DIAGNOSIS — M797 Fibromyalgia: Secondary | ICD-10-CM | POA: Diagnosis not present

## 2015-04-15 DIAGNOSIS — M179 Osteoarthritis of knee, unspecified: Secondary | ICD-10-CM | POA: Diagnosis not present

## 2015-04-15 DIAGNOSIS — M2550 Pain in unspecified joint: Secondary | ICD-10-CM | POA: Diagnosis not present

## 2015-04-22 DIAGNOSIS — N3010 Interstitial cystitis (chronic) without hematuria: Secondary | ICD-10-CM | POA: Diagnosis not present

## 2015-04-28 DIAGNOSIS — N3010 Interstitial cystitis (chronic) without hematuria: Secondary | ICD-10-CM | POA: Diagnosis not present

## 2015-05-03 DIAGNOSIS — N3010 Interstitial cystitis (chronic) without hematuria: Secondary | ICD-10-CM | POA: Diagnosis not present

## 2015-05-05 DIAGNOSIS — M1711 Unilateral primary osteoarthritis, right knee: Secondary | ICD-10-CM | POA: Diagnosis not present

## 2015-05-05 DIAGNOSIS — M170 Bilateral primary osteoarthritis of knee: Secondary | ICD-10-CM | POA: Diagnosis not present

## 2015-05-05 DIAGNOSIS — M1712 Unilateral primary osteoarthritis, left knee: Secondary | ICD-10-CM | POA: Diagnosis not present

## 2015-05-10 DIAGNOSIS — N3010 Interstitial cystitis (chronic) without hematuria: Secondary | ICD-10-CM | POA: Diagnosis not present

## 2015-05-17 DIAGNOSIS — N3010 Interstitial cystitis (chronic) without hematuria: Secondary | ICD-10-CM | POA: Diagnosis not present

## 2015-05-24 DIAGNOSIS — N3010 Interstitial cystitis (chronic) without hematuria: Secondary | ICD-10-CM | POA: Diagnosis not present

## 2015-05-27 DIAGNOSIS — M1712 Unilateral primary osteoarthritis, left knee: Secondary | ICD-10-CM | POA: Diagnosis not present

## 2015-05-27 DIAGNOSIS — M170 Bilateral primary osteoarthritis of knee: Secondary | ICD-10-CM | POA: Diagnosis not present

## 2015-05-27 DIAGNOSIS — M1711 Unilateral primary osteoarthritis, right knee: Secondary | ICD-10-CM | POA: Diagnosis not present

## 2015-05-31 DIAGNOSIS — N3010 Interstitial cystitis (chronic) without hematuria: Secondary | ICD-10-CM | POA: Diagnosis not present

## 2015-06-02 DIAGNOSIS — N3010 Interstitial cystitis (chronic) without hematuria: Secondary | ICD-10-CM | POA: Diagnosis not present

## 2015-06-03 DIAGNOSIS — M170 Bilateral primary osteoarthritis of knee: Secondary | ICD-10-CM | POA: Diagnosis not present

## 2015-06-07 DIAGNOSIS — N3010 Interstitial cystitis (chronic) without hematuria: Secondary | ICD-10-CM | POA: Diagnosis not present

## 2015-06-09 DIAGNOSIS — M1712 Unilateral primary osteoarthritis, left knee: Secondary | ICD-10-CM | POA: Diagnosis not present

## 2015-06-09 DIAGNOSIS — M1711 Unilateral primary osteoarthritis, right knee: Secondary | ICD-10-CM | POA: Diagnosis not present

## 2015-06-09 DIAGNOSIS — M170 Bilateral primary osteoarthritis of knee: Secondary | ICD-10-CM | POA: Diagnosis not present

## 2015-06-14 DIAGNOSIS — N3010 Interstitial cystitis (chronic) without hematuria: Secondary | ICD-10-CM | POA: Diagnosis not present

## 2015-06-21 DIAGNOSIS — N3010 Interstitial cystitis (chronic) without hematuria: Secondary | ICD-10-CM | POA: Diagnosis not present

## 2015-06-28 DIAGNOSIS — N3010 Interstitial cystitis (chronic) without hematuria: Secondary | ICD-10-CM | POA: Diagnosis not present

## 2015-07-02 ENCOUNTER — Other Ambulatory Visit (HOSPITAL_COMMUNITY): Payer: Self-pay | Admitting: Psychiatry

## 2015-07-05 DIAGNOSIS — N3010 Interstitial cystitis (chronic) without hematuria: Secondary | ICD-10-CM | POA: Diagnosis not present

## 2015-07-11 ENCOUNTER — Other Ambulatory Visit (HOSPITAL_COMMUNITY): Payer: Self-pay | Admitting: Psychiatry

## 2015-07-11 DIAGNOSIS — F341 Dysthymic disorder: Secondary | ICD-10-CM | POA: Diagnosis not present

## 2015-07-15 DIAGNOSIS — N3010 Interstitial cystitis (chronic) without hematuria: Secondary | ICD-10-CM | POA: Diagnosis not present

## 2015-07-19 DIAGNOSIS — N3010 Interstitial cystitis (chronic) without hematuria: Secondary | ICD-10-CM | POA: Diagnosis not present

## 2015-07-21 DIAGNOSIS — M170 Bilateral primary osteoarthritis of knee: Secondary | ICD-10-CM | POA: Diagnosis not present

## 2015-07-26 DIAGNOSIS — N3011 Interstitial cystitis (chronic) with hematuria: Secondary | ICD-10-CM | POA: Diagnosis not present

## 2015-07-26 DIAGNOSIS — Z01419 Encounter for gynecological examination (general) (routine) without abnormal findings: Secondary | ICD-10-CM | POA: Diagnosis not present

## 2015-07-26 DIAGNOSIS — R102 Pelvic and perineal pain: Secondary | ICD-10-CM | POA: Diagnosis not present

## 2015-07-26 DIAGNOSIS — N362 Urethral caruncle: Secondary | ICD-10-CM | POA: Insufficient documentation

## 2015-08-02 DIAGNOSIS — R51 Headache: Secondary | ICD-10-CM | POA: Diagnosis not present

## 2015-08-02 DIAGNOSIS — N3010 Interstitial cystitis (chronic) without hematuria: Secondary | ICD-10-CM | POA: Diagnosis not present

## 2015-08-05 DIAGNOSIS — M25562 Pain in left knee: Secondary | ICD-10-CM | POA: Diagnosis not present

## 2015-08-05 DIAGNOSIS — M25561 Pain in right knee: Secondary | ICD-10-CM | POA: Diagnosis not present

## 2015-08-09 DIAGNOSIS — N3010 Interstitial cystitis (chronic) without hematuria: Secondary | ICD-10-CM | POA: Diagnosis not present

## 2015-08-10 DIAGNOSIS — H5712 Ocular pain, left eye: Secondary | ICD-10-CM | POA: Diagnosis not present

## 2015-08-10 DIAGNOSIS — H16102 Unspecified superficial keratitis, left eye: Secondary | ICD-10-CM | POA: Diagnosis not present

## 2015-08-10 DIAGNOSIS — H04122 Dry eye syndrome of left lacrimal gland: Secondary | ICD-10-CM | POA: Diagnosis not present

## 2015-08-12 ENCOUNTER — Other Ambulatory Visit: Payer: Self-pay | Admitting: Internal Medicine

## 2015-08-12 DIAGNOSIS — Z0000 Encounter for general adult medical examination without abnormal findings: Secondary | ICD-10-CM | POA: Diagnosis not present

## 2015-08-12 DIAGNOSIS — I129 Hypertensive chronic kidney disease with stage 1 through stage 4 chronic kidney disease, or unspecified chronic kidney disease: Secondary | ICD-10-CM | POA: Diagnosis not present

## 2015-08-12 DIAGNOSIS — G500 Trigeminal neuralgia: Secondary | ICD-10-CM | POA: Diagnosis not present

## 2015-08-12 DIAGNOSIS — F324 Major depressive disorder, single episode, in partial remission: Secondary | ICD-10-CM | POA: Diagnosis not present

## 2015-08-12 DIAGNOSIS — E119 Type 2 diabetes mellitus without complications: Secondary | ICD-10-CM | POA: Diagnosis not present

## 2015-08-12 DIAGNOSIS — Z1389 Encounter for screening for other disorder: Secondary | ICD-10-CM | POA: Diagnosis not present

## 2015-08-15 ENCOUNTER — Ambulatory Visit
Admission: RE | Admit: 2015-08-15 | Discharge: 2015-08-15 | Disposition: A | Discharge status: Home / Self Care | Payer: Medicare Other | Source: Ambulatory Visit | Attending: Internal Medicine | Admitting: Internal Medicine

## 2015-08-15 DIAGNOSIS — G500 Trigeminal neuralgia: Secondary | ICD-10-CM

## 2015-08-15 DIAGNOSIS — R51 Headache: Secondary | ICD-10-CM | POA: Diagnosis not present

## 2015-08-16 DIAGNOSIS — M170 Bilateral primary osteoarthritis of knee: Secondary | ICD-10-CM | POA: Diagnosis not present

## 2015-08-16 DIAGNOSIS — N3010 Interstitial cystitis (chronic) without hematuria: Secondary | ICD-10-CM | POA: Diagnosis not present

## 2015-08-18 DIAGNOSIS — N3010 Interstitial cystitis (chronic) without hematuria: Secondary | ICD-10-CM | POA: Diagnosis not present

## 2015-08-23 DIAGNOSIS — D235 Other benign neoplasm of skin of trunk: Secondary | ICD-10-CM | POA: Diagnosis not present

## 2015-08-23 DIAGNOSIS — J301 Allergic rhinitis due to pollen: Secondary | ICD-10-CM | POA: Diagnosis not present

## 2015-08-23 DIAGNOSIS — H9312 Tinnitus, left ear: Secondary | ICD-10-CM | POA: Diagnosis not present

## 2015-08-23 DIAGNOSIS — N3010 Interstitial cystitis (chronic) without hematuria: Secondary | ICD-10-CM | POA: Diagnosis not present

## 2015-08-23 DIAGNOSIS — L821 Other seborrheic keratosis: Secondary | ICD-10-CM | POA: Diagnosis not present

## 2015-08-23 DIAGNOSIS — D485 Neoplasm of uncertain behavior of skin: Secondary | ICD-10-CM | POA: Diagnosis not present

## 2015-08-23 DIAGNOSIS — J320 Chronic maxillary sinusitis: Secondary | ICD-10-CM | POA: Diagnosis not present

## 2015-08-23 DIAGNOSIS — D225 Melanocytic nevi of trunk: Secondary | ICD-10-CM | POA: Diagnosis not present

## 2015-08-23 DIAGNOSIS — D1801 Hemangioma of skin and subcutaneous tissue: Secondary | ICD-10-CM | POA: Diagnosis not present

## 2015-08-23 DIAGNOSIS — H9042 Sensorineural hearing loss, unilateral, left ear, with unrestricted hearing on the contralateral side: Secondary | ICD-10-CM | POA: Diagnosis not present

## 2015-08-23 DIAGNOSIS — J3081 Allergic rhinitis due to animal (cat) (dog) hair and dander: Secondary | ICD-10-CM | POA: Diagnosis not present

## 2015-08-23 DIAGNOSIS — H8142 Vertigo of central origin, left ear: Secondary | ICD-10-CM | POA: Diagnosis not present

## 2015-08-25 DIAGNOSIS — N3010 Interstitial cystitis (chronic) without hematuria: Secondary | ICD-10-CM | POA: Diagnosis not present

## 2015-08-26 ENCOUNTER — Encounter: Payer: Self-pay | Admitting: Neurology

## 2015-08-26 ENCOUNTER — Ambulatory Visit (INDEPENDENT_AMBULATORY_CARE_PROVIDER_SITE_OTHER): Payer: Medicare Other | Admitting: Neurology

## 2015-08-26 VITALS — BP 148/76 | HR 94 | Ht 64.5 in | Wt 161.2 lb

## 2015-08-26 DIAGNOSIS — H532 Diplopia: Secondary | ICD-10-CM

## 2015-08-26 DIAGNOSIS — H05112 Granuloma of left orbit: Secondary | ICD-10-CM

## 2015-08-26 DIAGNOSIS — R519 Headache, unspecified: Secondary | ICD-10-CM

## 2015-08-26 DIAGNOSIS — R51 Headache: Secondary | ICD-10-CM

## 2015-08-26 DIAGNOSIS — H5712 Ocular pain, left eye: Secondary | ICD-10-CM | POA: Diagnosis not present

## 2015-08-26 NOTE — Patient Instructions (Signed)
Remember to drink plenty of fluid, eat healthy meals and do not skip any meals. Try to eat protein with a every meal and eat a healthy snack such as fruit or nuts in between meals. Try to keep a regular sleep-wake schedule and try to exercise daily, particularly in the form of walking, 20-30 minutes a day, if you can.   As far as diagnostic testing: MRIs of the head, Labs today  Our phone number is [phone]. We also have an after hours call service for urgent matters and there is a physician on-call for urgent questions. For any emergencies you know to call 911 or go to the nearest emergency room

## 2015-08-26 NOTE — Progress Notes (Signed)
[facility]    Provider:  Dr Jaynee Eagles Referring Provider: Dr. Jeneen Rinks crossly. Primary Care Physician:  Irven Shelling, MD  CC: Diplopia  HPI:  Erica Drake is a 80 y.o. female here as a referral from Dr. Ernesto Rutherford for diplopia. She has a past medical history of chronic maxillary sinusitis, vertigo of central origin left ear, tinnitus left ear, sensorineural hearing loss, allergic rhinitis, fibromyalgia, insomnia. She has diplopia and has already seen an ophthalmologist who cannot find a reason for it. She wears a patch on her left eye for the diplopia. She has had diplopia since dental surgery and left sided injury to her sinuses with implants breaking into the left maxillary sinus cavity. Implants since march. She was on an antibiotic for a month as well as tramadol, later in march she started noticing double vision in the left eye, closing the right eye with persistent symptoms. She also has blurry vision in the right eye. Double vision started slowly and she felt off balance. She would look at something and see images one on top of the other. The double vision persists with the right eye closed. Gets worse as the day wears on, worse at night. She has headache, eye pain, feels like someone hit her in the eye. No history of headaches. Pain on eye movement. Continuous, even hurts right now, pressure. She saw an ophthalmologist and did not find any issue with the eye and globe. She has chronic pain. No vision loss, symptoms are stable but not improving. She has dizziness and hearing loss. No jaw pain, no fevers or systemic signs, no new neck pain or stiffness.   Reviewed notes, labs and imaging from outside physicians, which showed:  CT Maxillofacial wo contrast 08/16/2015: Personally reviewed images and agree with the following:  FINDINGS: There are two left maxillary teeth implants which extend into the maxillary sinus. There is a small amount of soft tissue thickening  within the dependent portion of the maxillary sinus, without evidence of focal fluid collection. The evaluation of the periodontal soft tissues is largely limited by beam hardening artifact from dental hardware.  The globes and extraocular muscles appear symmetrical. No air fluid levels in the paranasal sinuses.  The frontal bones, orbital rims, maxillary antral walls, nasal bones, nasal septum, nasal spine, maxilla, pterygoid plates, zygomatic arches, temporomandibular joints, and mandibles appear intact.  No displaced fractures are identified. Visualized thyroid cartilage and hyoid bone appear intact.  IMPRESSION: No evidence of fracture of the facial bones.  No evidence of focal fluid collection, given the limitations of this study caused by beam hardening artifact due to presence of dental hardware.  Two left maxillary teeth implants, which extend into the maxillary sinus with minimal soft tissue thickening within the dependent portion of the maxillary sinus.  CBC in November 2016 showed elevated white blood cells otherwise normal, creatinine was 1.11 and GFR was 47.  Reviewed notes from Dr. Ernesto Rutherford. This is a 80 year old female who comes in for evaluation of hearing loss. At the age of 70 she had a profound sensorineural hearing loss with no response in the left ear. She has a high frequent, but mid frequency sensorineural hearing loss of 35 dB speech reception threshold and a 92% discrimination score in the right side. She's had some Mnire's issues where she has had vertigo to the point where she has vomited and had to call in to the doctor's office essentially. She has a history of eustachian tube dysfunction, allergic rhinitis, bronchitis, esophageal  reflux, fibromyalgia and insomnia. She had a Nissen fundoplication apparently years ago. She has fibromyalgia, interstitial cystitis GERD. She is supposed to be on blood pressure medication but she is not taking it because it makes her  cost. She takes Wellbutrin, Klonopin, Cymbalta, Nexium, Zetia, Nasonex and Spiriva. ENT examination was unremarkable. Diagnosis was profound sensorineural hearing loss left with moderate sensorineural hearing loss right with history of Mnire's, history of GERD and fibromyalgia and interstitial cystitis and allergic rhinitis. Dr. Ernesto Rutherford suspected she may have some mild Mnire's.  Review of Systems: Patient complains of symptoms per HPI as well as the following symptoms: No CP, no SOB. Pertinent negatives per HPI. All others negative.   Social History   Social History  . Marital Status: Married    Spouse Name: N/A  . Number of Children: N/A  . Years of Education: N/A   Occupational History  . Not on file.   Social History Main Topics  . Smoking status: Former Research scientist (life sciences)  . Smokeless tobacco: Former Systems developer    Quit date: 01/09/1974  . Alcohol Use: No  . Drug Use: No  . Sexual Activity: Not on file   Other Topics Concern  . Not on file   Social History Narrative   Pt was adopted.    Family History  Problem Relation Age of Onset  . Adopted: Yes  . Asthma Mother     biologic  . Asthma Son   . Stroke Neg Hx   . Neuropathy Neg Hx   . Multiple sclerosis Neg Hx     Past Medical History  Diagnosis Date  . Chronic bronchitis     some mild wheezing at change of weather-  . Allergic rhinitis   . Decreased hearing     intol hearing aids. Deaf left ear, hearing aid right ear.  . Fibromyalgia   . Interstitial cystitis     [facility]-Dr. Karsten Ro sees periodically.(occ. self caths at home)  . Chronic fatigue   . Eustachian tube dysfunction     decreased hearing  . GERD (gastroesophageal reflux disease)     Nissen  . Vertigo     Past Surgical History  Procedure Laterality Date  . Nissen fundoplication    . Cholecystectomy N/A 01/14/2015    Procedure: LAPAROSCOPIC CHOLECYSTECTOMY WITH INTRAOPERATIVE CHOLANGIOGRAM;  Surgeon: Johnathan Hausen, MD;  Location: [facility];   Service: General;  Laterality: N/A;    Current Outpatient Prescriptions  Medication Sig Dispense Refill  . albuterol (PROAIR HFA) 108 (90 Base) MCG/ACT inhaler Inhale into the lungs.    . bupivacaine (MARCAINE) 0.25 % injection by Infiltration route once. For the bladder irrigation.    Marland Kitchen buPROPion (WELLBUTRIN SR) 200 MG 12 hr tablet Take 200 mg by mouth 2 (two) times daily.     . ciprofloxacin (CIPRO) 500 MG tablet Take 500 mg by mouth.    . clonazePAM (KLONOPIN) 2 MG tablet Take 2 mg by mouth.    . DEXILANT 60 MG capsule Take 60 mg by mouth daily.  5  . Dexlansoprazole 30 MG capsule Take 30 mg by mouth.    . doxycycline (VIBRA-TABS) 100 MG tablet   0  . ESTRACE VAGINAL 0.1 MG/GM vaginal cream   10  . estradiol (ESTRACE VAGINAL) 0.1 MG/GM vaginal cream     . famotidine (TH FAMOTIDINE 10) 10 MG tablet Take by mouth.    . Lactobacillus (LACTINEX PO) Take by mouth. Takes 2 a day    . Lidocaine, Anorectal, 5 % GEL     .  magnesium hydroxide (MILK OF MAGNESIA) 400 MG/5ML suspension Take 30 mLs by mouth.    . Meclizine HCl 25 MG CHEW Chew 25 mg by mouth.    . mometasone (NASONEX) 50 MCG/ACT nasal spray Place into the nose.    . Tiotropium Bromide Monohydrate (SPIRIVA RESPIMAT) 2.5 MCG/ACT AERS Inhale into the lungs.    . traMADol (ULTRAM) 50 MG tablet Take 50 mg by mouth every 6 (six) hours as needed.  0  . triamcinolone (NASACORT) 55 MCG/ACT AERO nasal inhaler Place into the nose.     No current facility-administered medications for this visit.    Allergies as of 08/26/2015 - Review Complete 08/26/2015  Allergen Reaction Noted  . Cortisone Shortness Of Breath, Swelling, and Other (See Comments) 03/25/2014  . Amlodipine besylate Other (See Comments) 08/26/2015  . Chlorhexidine gluconate Itching 01/14/2015  . Clindamycin Itching 08/26/2015  . Codeine Other (See Comments) 08/26/2015  . Dexamethasone Other (See Comments) 08/26/2015  . Duloxetine Other (See Comments) 08/26/2015  . Iohexol  Other (See Comments) 08/26/2015  . Morphine sulfate Other (See Comments) 08/26/2015  . Niacin Other (See Comments) 08/26/2015  . Other Other (See Comments) 08/26/2015  . Prednisone Other (See Comments) 05/10/2014  . Rosuvastatin Other (See Comments) 08/26/2015  . Rosuvastatin calcium Other (See Comments) 08/26/2015  . Statins Other (See Comments)   . Strawberry extract Other (See Comments) 08/26/2015  . Sulfa antibiotics Other (See Comments) 08/26/2015  . Valsartan Other (See Comments) 08/26/2015    Vitals: BP 148/76 mmHg  Pulse 94  Ht 5' 4.5" (1.638 m)  Wt 161 lb 3.2 oz (73.12 kg)  BMI 27.25 kg/m2 Last Weight:  Wt Readings from Last 1 Encounters:  08/26/15 161 lb 3.2 oz (73.12 kg)   Last Height:   Ht Readings from Last 1 Encounters:  08/26/15 5' 4.5" (1.638 m)    Physical exam: Exam: Gen: NAD, conversant, well nourised, well groomed                     CV: RRR, no MRG. No Carotid Bruits. No peripheral edema, warm, nontender Eyes: Conjunctivae clear without exudates or hemorrhage  Neuro: Detailed Neurologic Exam  Speech:    Speech is normal; fluent and spontaneous with normal comprehension.  Cognition:    The patient is oriented to person, place, and time;     recent and remote memory intact;     language fluent;     normal attention, concentration,     fund of knowledge Cranial Nerves:    The pupils are equal, round, and reactive to light. The fundi are normal and spontaneous venous pulsations are present. Visual fields are full to finger confrontation. Extraocular movements are intact. Trigeminal sensation is intact and the muscles of mastication are normal. The face is symmetric. The palate elevates in the midline. Hearing intact. Voice is normal. Shoulder shrug is normal. The tongue has normal motion without fasciculations. Diplopia is continuous however worse left lateral gaze, distant > far.  Coordination:    Normal finger to nose and heel to shin. Normal rapid  alternating movements.   Gait:    Imbalance, stumbles when takes patch off  Motor Observation:    No asymmetry, no atrophy, and no involuntary movements noted. Tone:    Normal muscle tone.    Posture:    Posture is normal. normal erect    Strength: LE giveway due to pain but UE strength intact.       Sensation: intact to LT  Reflex Exam:  DTR's:    Deep tendon reflexes in the upper and lower extremities are normal bilaterally.   Toes:    The toes are downgoing bilaterally.   Clonus:    Clonus is absent.      Assessment/Plan:  80 year old female with diplopia. Symptoms are monocular, diplopia is present even when closing the right eye. This points an etiology within the eye globe as opposed to in the brain which would be more concerning if the diplopia was binocular. So this is reassuring from a neurologic perspective. Does not appear to be neuromuscular in nature, does not fit myasthenia gravis for example but will order antibodies as well as MRI of the brain with thin cuts through the orbits, MRA of the head, and labs to try an evaluate for any possible causes. Will also check esr and crp for temporal arteritis.   Sarina Ill, MD  [facility] [address] [place], [place] [address]  Phone [phone] Fax [phone]

## 2015-08-29 ENCOUNTER — Telehealth: Payer: Self-pay | Admitting: *Deleted

## 2015-08-29 DIAGNOSIS — N3010 Interstitial cystitis (chronic) without hematuria: Secondary | ICD-10-CM | POA: Diagnosis not present

## 2015-08-29 NOTE — Telephone Encounter (Signed)
Patient is calling back. Please call

## 2015-08-29 NOTE — Telephone Encounter (Signed)
Called pt back. Relayed results per Dr Jaynee Eagles note. She verbalized understanding. Advised she will get a call from [facility] to schedule MRI's that Dr Jaynee Eagles ordered.

## 2015-08-29 NOTE — Telephone Encounter (Signed)
-----   Message from Melvenia Beam, MD sent at 08/27/2015 12:27 PM EDT ----- Inflammatroy markers sed rate and crp normal, this rules out temporal arteritis (an inflammation of blood vessels) for her double vision. Thyroid normal. BMP shows elevated creatinine which is slightly improved from 7 mnths ago. The other labs for myasthenia gravis will take a few weeks to come back thanks

## 2015-08-29 NOTE — Telephone Encounter (Signed)
Called and spoke to husband. Pt and her caregiver were at another appt. He will have them call about results when they get home. I gave my name and [facility] phone number.

## 2015-08-30 ENCOUNTER — Encounter: Payer: Self-pay | Admitting: Neurology

## 2015-08-30 DIAGNOSIS — H532 Diplopia: Secondary | ICD-10-CM | POA: Insufficient documentation

## 2015-09-06 DIAGNOSIS — N3010 Interstitial cystitis (chronic) without hematuria: Secondary | ICD-10-CM | POA: Diagnosis not present

## 2015-09-07 DIAGNOSIS — J320 Chronic maxillary sinusitis: Secondary | ICD-10-CM | POA: Diagnosis not present

## 2015-09-08 ENCOUNTER — Ambulatory Visit
Admission: RE | Admit: 2015-09-08 | Discharge: 2015-09-08 | Disposition: A | Discharge status: Home / Self Care | Payer: Medicare Other | Source: Ambulatory Visit | Attending: Neurology | Admitting: Neurology

## 2015-09-08 ENCOUNTER — Telehealth: Payer: Self-pay | Admitting: *Deleted

## 2015-09-08 DIAGNOSIS — R51 Headache: Secondary | ICD-10-CM | POA: Diagnosis not present

## 2015-09-08 DIAGNOSIS — H05112 Granuloma of left orbit: Secondary | ICD-10-CM

## 2015-09-08 DIAGNOSIS — H532 Diplopia: Secondary | ICD-10-CM | POA: Diagnosis not present

## 2015-09-08 DIAGNOSIS — H5712 Ocular pain, left eye: Secondary | ICD-10-CM

## 2015-09-08 DIAGNOSIS — R519 Headache, unspecified: Secondary | ICD-10-CM

## 2015-09-08 MED ORDER — GADOBENATE DIMEGLUMINE 529 MG/ML IV SOLN: 15.0000 mL | Freq: Once | INTRAVENOUS | Status: DC | PRN

## 2015-09-08 MED ORDER — GADOBENATE DIMEGLUMINE 529 MG/ML IV SOLN
15.0000 mL | Freq: Once | INTRAVENOUS | Status: AC | PRN
  Administered 2015-09-08: 15 mL via INTRAVENOUS

## 2015-09-08 NOTE — Telephone Encounter (Signed)
Called and spoke to pt about lab results per Dr Jaynee Eagles note. Made f/u for 09/29/15 at 10am, check in 945am. SHe is going for imaging today at [facility].

## 2015-09-08 NOTE — Telephone Encounter (Signed)
-----   Message from Melvenia Beam, MD sent at 09/08/2015 10:23 AM EDT ----- Patient's labs came back positive for myasthenia gravis. I need to see her in the office to discuss in early August. If she has shortness of breath or worsening symptoms she should go to the emergency room. I will discuss in detail with her at appointment thanks

## 2015-09-12 ENCOUNTER — Telehealth: Payer: Self-pay | Admitting: Neurology

## 2015-09-12 ENCOUNTER — Telehealth: Payer: Self-pay

## 2015-09-12 NOTE — Telephone Encounter (Signed)
-----   Message from Melvenia Beam, MD sent at 09/12/2015 10:27 AM EDT ----- Please see previous result notes with more details but MRA of the head is also normal for age. But see last result note thanks

## 2015-09-12 NOTE — Telephone Encounter (Signed)
-----   Message from Melvenia Beam, MD sent at 09/12/2015 10:25 AM EDT ----- MRI of the brain and orbits are normal for age. Some mild atrophy and white matter changes which again are normal for age. Nothing to explain her blurry vision. But her labs did come back positive for myasthenia gravis, please ensure she knows this. I sent a result note with those results but want to make sure she got the results: her blood tests were positive for myasthenia gravis and this can cause double vision when confined to the eyes called ocular myasthenia gravis. She should have a follow up with me in August to discuss these results and make sure they are not false positives. thanks

## 2015-09-12 NOTE — Telephone Encounter (Signed)
I spoke with pt regarding her MRI/MRA brain results. Please see documentation from the result note phone call.

## 2015-09-12 NOTE — Telephone Encounter (Signed)
I spoke to pt and advised her that her MRA brain was normal for her age. Pt verbalized understanding of results. Pt had no questions at this time but was encouraged to call back if questions arise.

## 2015-09-12 NOTE — Telephone Encounter (Signed)
I spoke to pt and advised her that her MRI brain and orbits are normal for her age. Some mild atrophy and white matter changes were seen but this is normal for the pt's age, Dr. Jaynee Eagles did not find anything to explain her blurry vision. Pt is aware that her labs were positive for myasthenia gravis which may cause double vision. Pt already has a follow up in August with Dr. Jaynee Eagles. Pt verbalized understanding of results. Pt had no questions at this time but was encouraged to call back if questions arise. Pt wants a copy of her MRI/MRA results. Pt will have her CNA Pricilla Handler, per [facility]) come and sign a MR for the records.

## 2015-09-12 NOTE — Telephone Encounter (Signed)
Patient called requesting MRI results, also states she has appointment in [facility] for tooth implant removal. Was waiting for results of MRI before proceeding with implant removal. Please call.

## 2015-09-13 DIAGNOSIS — N3010 Interstitial cystitis (chronic) without hematuria: Secondary | ICD-10-CM | POA: Diagnosis not present

## 2015-09-16 DIAGNOSIS — F341 Dysthymic disorder: Secondary | ICD-10-CM | POA: Diagnosis not present

## 2015-09-19 LAB — BASIC METABOLIC PANEL
BUN / CREAT RATIO: 19 (ref 12–28)
BUN: 20 mg/dL (ref 8–27)
CHLORIDE: 101 mmol/L (ref 96–106)
CO2: 26 mmol/L (ref 18–29)
Calcium: 9.7 mg/dL (ref 8.7–10.3)
Creatinine, Ser: 1.05 mg/dL — ABNORMAL HIGH (ref 0.57–1.00)
GFR calc Af Amer: 59 mL/min/{1.73_m2} — ABNORMAL LOW (ref >59)
GFR calc non Af Amer: 51 mL/min/{1.73_m2} — ABNORMAL LOW (ref >59)
GLUCOSE: 100 mg/dL — AB (ref 65–99)
Potassium: 5.4 mmol/L — ABNORMAL HIGH (ref 3.5–5.2)
SODIUM: 142 mmol/L (ref 134–144)

## 2015-09-19 LAB — ACETYLCHOLINE RECEPTOR, BINDING: AChR Binding Ab, Serum: 0.63 nmol/L — ABNORMAL HIGH (ref 0.00–0.24)

## 2015-09-19 LAB — THYROID PANEL WITH TSH
FREE THYROXINE INDEX: 1.8 (ref 1.2–4.9)
T3 UPTAKE RATIO: 26 % (ref 24–39)
T4 TOTAL: 6.9 ug/dL (ref 4.5–12.0)
TSH: 2.28 u[IU]/mL (ref 0.450–4.500)

## 2015-09-19 LAB — ACETYLCHOLINE RECEPTOR, BLOCKING: Acetylchol Block Ab: 28 % — ABNORMAL HIGH (ref 0–25)

## 2015-09-19 LAB — SEDIMENTATION RATE: SED RATE: 3 mm/h (ref 0–40)

## 2015-09-19 LAB — C-REACTIVE PROTEIN: CRP: 4.9 mg/L (ref 0.0–4.9)

## 2015-09-19 LAB — ACETYLCHOLINE RECEPTOR, MODULATING: Acetylcholine Modulat Ab: <12 % (ref 0–20)

## 2015-09-20 ENCOUNTER — Other Ambulatory Visit: Payer: Self-pay | Admitting: Neurology

## 2015-09-20 ENCOUNTER — Telehealth: Payer: Self-pay | Admitting: Neurology

## 2015-09-20 MED ORDER — PYRIDOSTIGMINE BROMIDE 60 MG PO TABS: 30.0000 mg | ORAL_TABLET | Freq: Three times a day (TID) | ORAL | 6 refills | Status: DC

## 2015-09-20 NOTE — Telephone Encounter (Signed)
Patient called, states she talked with Dr. Jaynee Eagles yesterday about recent diagnosis of Myasthenia Gravis and that Dr. Jaynee Eagles said that she could go ahead and start her on medication prior to appointment on August 3rd. Patient requests that Dr. Jaynee Eagles send a prescription for this medication in to  [facility] [address]. Patient can't remember the name of the medication.

## 2015-09-20 NOTE — Telephone Encounter (Signed)
Sent to pharmacy, we discussed side effects over the phone this weekend thanks

## 2015-09-20 NOTE — Telephone Encounter (Signed)
Dr Jaynee Eagles- can you send medication per pt request? I do not see recent med sent, thank you

## 2015-09-27 DIAGNOSIS — N3010 Interstitial cystitis (chronic) without hematuria: Secondary | ICD-10-CM | POA: Diagnosis not present

## 2015-09-29 ENCOUNTER — Encounter: Payer: Self-pay | Admitting: Neurology

## 2015-09-29 ENCOUNTER — Ambulatory Visit (INDEPENDENT_AMBULATORY_CARE_PROVIDER_SITE_OTHER): Payer: Medicare Other | Admitting: Neurology

## 2015-09-29 ENCOUNTER — Ambulatory Visit: Payer: Self-pay | Admitting: Neurology

## 2015-09-29 VITALS — BP 150/84 | HR 115 | Ht 64.5 in | Wt 164.3 lb

## 2015-09-29 DIAGNOSIS — G7001 Myasthenia gravis with (acute) exacerbation: Secondary | ICD-10-CM

## 2015-09-29 MED ORDER — PYRIDOSTIGMINE BROMIDE 60 MG PO TABS: ORAL_TABLET | ORAL | 6 refills | Status: DC

## 2015-09-29 NOTE — Patient Instructions (Addendum)
Start mestinon as follows:  take half tablet at 8am and 6pm for one week, then increase to half tablet 8am, 1pm, and 6pm.  If you are tolerating the medication, please slowly increase to goal of 1 tablet three times daily.  Return to clinic on September 6th at 11:30am, please arrived 15 min prior to appointment time.

## 2015-09-29 NOTE — Progress Notes (Signed)
[facility] Clinic Note - Initial Visit   Date: 09/29/15  Erica Drake MRN: [id number] DOB: 03/07/1935   Dear Dr. Laurann Montana:  Thank you for your kind referral of Erica Drake for consultation of myasthenia gravis. Although her history is well known to you, please allow [place] to reiterate it for the purpose of our medical record. The patient was accompanied to the clinic by self.    History of Present Illness: Erica Drake is a 80 y.o. right-handed Caucasian female with fibromyalgia, GERD, depression, left sensorineural deficit, and interstitial cystitis presenting for evaluation of seropositive myasthenia gravis.    In May 2017, she began experiencing double vision which are located vertical and diagonal to each other.  She has noticed mornings are much better, but as the day progresses, her double vision gets worsen.  She endorses mild changes to her speech.  No difficulty with swallowing or shortness of breath.  She also complains of blurry vision in her right eye. The double vision has interfered with her driving, so she stopped this 2 months ago. She has seen her ENT, PCP, ophthalmologist, and then saw Dr. Jaynee Eagles in June who ordered MRI/A head and orbit which was normal.  Her acetylcholine antibodies returned positive.  A prescription for mestinon 33m three times daily was ordered, but patient did not start it.  She has many drug intolerances/allergies, including cortisone, so medications were started at a low dose.   She had many other chronic medical problems including chronic pain related to fibromyalgia, arthritis, and interstitial cystitis.    Out-side paper records, electronic medical record, and images have been reviewed where available and summarized as:  Labs 08/26/2015:  AChR binding (0.63) blocking (28) antibody, ESR 3, CRP 4.9  MRI/A head 09/09/2015:  This MR angiogram of the intracerebral arteries shows the following: 1.   Very minimal  atherosclerotic change within the left posterior cerebral artery that is unlikely to be clinically significant. 2.   There is a normal variant with the left posterior cerebral artery obtaining its flow from the anterior circulation. 3.    No aneurysms are seen. 4.    No new findings compared to the 11/25/2003 MRA.     MRI orbit wwo contrast 09/09/2015:  This is is a normal MRI of the orbits with and without contrast.   Past Medical History:  Diagnosis Date  . Allergic rhinitis   . Chronic bronchitis    some mild wheezing at change of weather-  . Chronic fatigue   . Decreased hearing    intol hearing aids. Deaf left ear, hearing aid right ear.  . Eustachian tube dysfunction    decreased hearing  . Fibromyalgia   . GERD (gastroesophageal reflux disease)    Nissen  . Interstitial cystitis    [facility]-Dr. OKarsten Rosees periodically.(occ. self caths at home)  . Vertigo     Past Surgical History:  Procedure Laterality Date  . CHOLECYSTECTOMY N/A 01/14/2015   Procedure: LAPAROSCOPIC CHOLECYSTECTOMY WITH INTRAOPERATIVE CHOLANGIOGRAM;  Surgeon: MJohnathan Hausen MD;  Location: [facility];  Service: General;  Laterality: N/A;  . NISSEN FUNDOPLICATION       Medications:  Outpatient Encounter Prescriptions as of 09/29/2015  Medication Sig Note  . albuterol (PROAIR HFA) 108 (90 Base) MCG/ACT inhaler Inhale into the lungs. 08/26/2015: Received from: [facility] . bupivacaine (MARCAINE) 0.25 % injection by Infiltration route once. For the bladder irrigation.   .Marland KitchenbuPROPion (WELLBUTRIN SR) 200 MG 12  hr tablet Take 200 mg by mouth 2 (two) times daily.    . ciprofloxacin (CIPRO) 500 MG tablet Take 500 mg by mouth. 08/26/2015: Received from: [facility]  . clonazePAM (KLONOPIN) 2 MG tablet Take 2 mg by mouth. 08/26/2015: Received from: [facility]  . DEXILANT 60 MG capsule Take 60 mg by mouth daily.   Marland Kitchen Dexlansoprazole 30 MG  capsule Take 30 mg by mouth. 08/26/2015: Received from: [facility]  . ESTRACE VAGINAL 0.1 MG/GM vaginal cream  08/26/2015: Received from: External Pharmacy  . famotidine (TH FAMOTIDINE 10) 10 MG tablet Take by mouth. 08/26/2015: Received from: [facility]  . Lactobacillus (LACTINEX PO) Take by mouth. Takes 2 a day   . Lidocaine, Anorectal, 5 % GEL  08/26/2015: Received from: [facility]  . magnesium hydroxide (MILK OF MAGNESIA) 400 MG/5ML suspension Take 30 mLs by mouth. 08/26/2015: Received from: [facility]  . Meclizine HCl 25 MG CHEW Chew 25 mg by mouth. 08/26/2015: Received from: [facility]  . mometasone (NASONEX) 50 MCG/ACT nasal spray Place into the nose. 08/26/2015: Received from: [facility]  . pyridostigmine (MESTINON) 60 MG tablet Take half tablet at 8am and 6pm for one week, then increase to half tablet 8am, 1pm, and 6pm.   . Tiotropium Bromide Monohydrate (SPIRIVA RESPIMAT) 2.5 MCG/ACT AERS Inhale into the lungs. 08/26/2015: Received from: [facility]  . traMADol (ULTRAM) 50 MG tablet Take 50 mg by mouth every 6 (six) hours as needed. 08/26/2015: Received from: External Pharmacy  . triamcinolone (NASACORT) 55 MCG/ACT AERO nasal inhaler Place into the nose. 08/26/2015: Received from: [facility]  . [DISCONTINUED] pyridostigmine (MESTINON) 60 MG tablet Take 0.5 tablets (30 mg total) by mouth 3 (three) times daily. Take 4-6 hours apart during the day. For example 8am, noon and 4pm.   . [DISCONTINUED] doxycycline (VIBRA-TABS) 100 MG tablet  08/26/2015: Received from: External Pharmacy  . [DISCONTINUED] estradiol (ESTRACE VAGINAL) 0.1 MG/GM vaginal cream  08/26/2015: Received from: [facility]   No facility-administered encounter medications on file as of 09/29/2015.      Allergies:  Allergies    Allergen Reactions  . Cortisone Shortness Of Breath, Swelling and Other (See Comments)    Tongue swelling Tongue swelling  . Amlodipine Besylate Other (See Comments)  . Chlorhexidine Gluconate Itching    Skin on face turned red Skin on face turned red  . Clindamycin Itching  . Codeine Other (See Comments)  . Dexamethasone Other (See Comments)  . Duloxetine Other (See Comments)  . Iohexol Other (See Comments)  . Morphine Sulfate Other (See Comments)  . Niacin Other (See Comments)  . Other Other (See Comments)    Pollen   . Prednisone Other (See Comments)    Causes stomach pain. Sores in her mouth. Causes stomach pain. Sores in her mouth.  . Rosuvastatin Other (See Comments)  . Rosuvastatin Calcium Other (See Comments)    Muscle aches  . Statins Other (See Comments)    Muscle aches  . Strawberry Extract Other (See Comments)  . Sulfa Antibiotics Other (See Comments)  . Valsartan Other (See Comments)    Family History: Family History  Problem Relation Age of Onset  . Adopted: Yes  . Asthma Mother     biologic  . Asthma Son   . Stroke Neg Hx   .  Neuropathy Neg Hx   . Multiple sclerosis Neg Hx     Social History: Social History  Substance Use Topics  . Smoking status: Former Research scientist (life sciences)  . Smokeless tobacco: Former Systems developer    Quit date: 01/09/1974  . Alcohol use No   Social History   Social History Narrative   Pt was adopted.  Lives with husband in a one story home.  Has 2 children.  Retired from Veterinary surgeon work.     Education: some college.          Review of Systems:  CONSTITUTIONAL: No fevers, chills, night sweats, or weight loss.   EYES: +visual changes or eye pain ENT: + hearing changes.  No history of nose bleeds.   RESPIRATORY: No cough, wheezing and shortness of breath.   CARDIOVASCULAR: Negative for chest pain, and palpitations.   GI: Negative for abdominal discomfort, blood in stools or black stools.  No recent change in bowel habits.   GU:  No history  of incontinence.   MUSCLOSKELETAL: +history of joint pain or swelling.  +myalgias.   SKIN: Negative for lesions, rash, and itching.   HEMATOLOGY/ONCOLOGY: Negative for prolonged bleeding, bruising easily, and swollen nodes.    ENDOCRINE: Negative for cold or heat intolerance, polydipsia or goiter.   PSYCH:  +depression or anxiety symptoms.   NEURO: As Above.   Vital Signs:  BP (!) 150/84   Pulse (!) 115   Ht 5' 4.5" (1.638 m)   Wt 164 lb 5 oz (74.5 kg)   SpO2 92%   BMI 27.77 kg/m    General Medical Exam:   General:  Anxious appearing, comfortable.   Eyes/ENT: see cranial nerve examination.   Neck: No masses appreciated.  Full range of motion without tenderness.  No carotid bruits. Respiratory:  Clear to auscultation, good air entry bilaterally.  She is able to count to 26 on deep inhalation Cardiac:  Regular rate and rhythm, no murmur.   Extremities:  No deformities, edema, or skin discoloration.  Skin:  No rashes or lesions.  Neurological Exam: MENTAL STATUS including orientation to time, place, person, recent and remote memory, attention span and concentration, language, and fund of knowledge is normal.  Speech is not dysarthric.  CRANIAL NERVES: II:  No visual field defects.  Unremarkable fundi.   III-IV-VI: Pupils equal round and reactive to light. Normal conjugate, extra-ocular eye movements in all directions of gaze, except restricted down and lateral gaze on the left.  No nystagmus.  Left subtle ptosis worse with sustained upgaze   V:  Normal facial sensation.    VII:  Normal facial symmetry and movements. Frontalis, orbicularis oculi, buccinator, and orbicularis oris muscles are 5/5. VIII:  Normal hearing and vestibular function.   IX-X:  Normal palatal movement.   XI:  Normal shoulder shrug and head rotation.   XII:  Normal tongue strength and range of motion, no deviation or fasciculation.  MOTOR:  All major muscle groups are 5/5 including neck flexion, her motor  strength testing is somewhat limited by her fibromyalgia and arthritis of the knees.  No atrophy, fasciculations or abnormal movements.  No pronator drift.  Tone is normal.    MSRs:  Right  Left brachioradialis 2+  brachioradialis 2+  biceps 2+  biceps 2+  triceps 2+  triceps 2+  patellar 2+  patellar 2+  ankle jerk 2+  ankle jerk 2+  Hoffman no  Hoffman no  plantar response down  plantar response down   SENSORY:  Normal and symmetric perception of light touch, pinprick, vibration, and proprioception.  Romberg's sign absent.   COORDINATION/GAIT: Normal finger-to- nose-finger.  Intact rapid alternating movements bilaterally.  Able to rise from a chair without using arms.  Gait narrow based and stable.   IMPRESSION: Seropositive ocular myasthenia gravis (diagnosed June 2017) I had an extensive discussion with the patient regarding the diagnosis, pathogenesis, prognosis, management plan, meds to avoid, and potential crisis myasthenia gravis. I also discussed the warning signs and symptoms of MG crisis (including significant swallowing and breathing difficulty) that should prompt urgent medical evaluation.  Because of her multiple medication intolerances, I will start mestinon slowly at 43m BID and titrate to 319mTID over one week.  From there, she can increase to mestinon 6034mhree times daily. Hopefully, if symptoms can be well controlled on mestinon alone, she will not need to be on prednisone, however, if no improvement, prednisone 35m38mll be started.  With newly diagnosed MG, need to be cautious about symptoms generalizing and with her upcoming knee surgery in October, she will need close follow-up as extubation can sometimes be difficult in these patients.   Return to clinic in 1 month    The duration of this appointment visit was 50 minutes of face-to-face time with the patient.  Greater than 50% of this time was spent in  counseling, explanation of diagnosis, planning of further management, and coordination of care.   Thank you for allowing me to participate in patient's care.  If I can answer any additional questions, I would be pleased to do so.    Sincerely,    Naryiah Schley K. PatePosey Pronto

## 2015-10-04 DIAGNOSIS — J322 Chronic ethmoidal sinusitis: Secondary | ICD-10-CM | POA: Diagnosis not present

## 2015-10-04 DIAGNOSIS — J320 Chronic maxillary sinusitis: Secondary | ICD-10-CM | POA: Diagnosis not present

## 2015-10-07 ENCOUNTER — Telehealth: Payer: Self-pay | Admitting: Neurology

## 2015-10-07 MED ORDER — PREDNISONE 10 MG PO TABS: 10.0000 mg | ORAL_TABLET | Freq: Every day | ORAL | 5 refills | Status: DC

## 2015-10-07 NOTE — Telephone Encounter (Signed)
I called patient back and she said that she has been on Mestinon since June and is taking 60 mg tid.  She said that it is not working.  Your last note from August 3 said that you were starting her on Mestinon.  Patient seemed confused.  Please advise.

## 2015-10-07 NOTE — Telephone Encounter (Signed)
PT called and wants to know if she can increase her medication/Dawn CB# [phone]

## 2015-10-07 NOTE — Telephone Encounter (Signed)
Returned call to patient.  She is taking mestinon 60mg  TID without any benefit.  Recommend starting prednisone 10mg  daily, side effects were discussed.  She has many medication intolerances including adverse effect to cortisone injections, so I will start her on a low dose and uptitrate if there is no benefit.    Avigdor Dollar K. Posey Pronto, DO

## 2015-10-10 ENCOUNTER — Ambulatory Visit: Payer: Medicare Other | Admitting: Neurology

## 2015-10-11 DIAGNOSIS — N3010 Interstitial cystitis (chronic) without hematuria: Secondary | ICD-10-CM | POA: Diagnosis not present

## 2015-10-14 DIAGNOSIS — N3010 Interstitial cystitis (chronic) without hematuria: Secondary | ICD-10-CM | POA: Diagnosis not present

## 2015-10-25 ENCOUNTER — Other Ambulatory Visit: Payer: Self-pay | Admitting: *Deleted

## 2015-10-25 MED ORDER — PYRIDOSTIGMINE BROMIDE 60 MG PO TABS: 60.0000 mg | ORAL_TABLET | Freq: Three times a day (TID) | ORAL | 3 refills | Status: DC

## 2015-11-02 ENCOUNTER — Encounter: Payer: Self-pay | Admitting: Neurology

## 2015-11-02 ENCOUNTER — Ambulatory Visit (INDEPENDENT_AMBULATORY_CARE_PROVIDER_SITE_OTHER): Payer: Medicare Other | Admitting: Neurology

## 2015-11-02 ENCOUNTER — Other Ambulatory Visit (INDEPENDENT_AMBULATORY_CARE_PROVIDER_SITE_OTHER): Payer: Medicare Other

## 2015-11-02 VITALS — BP 140/80 | HR 102 | Ht 64.5 in | Wt 163.0 lb

## 2015-11-02 DIAGNOSIS — G7001 Myasthenia gravis with (acute) exacerbation: Secondary | ICD-10-CM

## 2015-11-02 DIAGNOSIS — H532 Diplopia: Secondary | ICD-10-CM

## 2015-11-02 LAB — BASIC METABOLIC PANEL
BUN: 18 mg/dL (ref 6–23)
CHLORIDE: 101 meq/L (ref 96–112)
CO2: 34 mEq/L — ABNORMAL HIGH (ref 19–32)
CREATININE: 1.14 mg/dL (ref 0.40–1.20)
Calcium: 9.1 mg/dL (ref 8.4–10.5)
GFR: 49.02 mL/min — ABNORMAL LOW (ref >60.00)
Glucose, Bld: 95 mg/dL (ref 70–99)
Potassium: 4.2 mEq/L (ref 3.5–5.1)
Sodium: 139 mEq/L (ref 135–145)

## 2015-11-02 MED ORDER — PREDNISONE 20 MG PO TABS: 40.0000 mg | ORAL_TABLET | Freq: Every day | ORAL | 5 refills | Status: DC

## 2015-11-02 NOTE — Progress Notes (Signed)
Note routed

## 2015-11-02 NOTE — Progress Notes (Signed)
Follow-up Visit   Date: 11/02/15    Erica Drake MRN: [id number] DOB: 02-11-1936   Interim History: Erica Drake is a 80 y.o. right-handed Caucasian female with fibromyalgia, GERD, depression, left sensorineural deficit, and interstitial cystitis, type 2 diabetes mellitus returning to the clinic for follow-up of seropositive myasthenia gravis.  The patient was accompanied to the clinic by self.  History of present illness: Initial visit 09/29/2015:  In May 2017, she began experiencing double vision which are located vertical and diagonal to each other.  She has noticed mornings are much better, but as the day progresses, her double vision gets worsen.  She endorses mild changes to her speech.  No difficulty with swallowing or shortness of breath.  She also complains of blurry vision in her right eye. The double vision has interfered with her driving, so she stopped this 2 months ago. She has seen her ENT, PCP, ophthalmologist, and then saw Dr. Jaynee Eagles in June who ordered MRI/A head and orbit which was normal.  Her acetylcholine antibodies returned positive.  A prescription for mestinon 74m three times daily was ordered, but patient did not start it.  She has many drug intolerances/allergies, including cortisone, so medications were started at a low dose.   She had many other chronic medical problems including chronic pain related to fibromyalgia, arthritis, and interstitial cystitis.     UPDATE 11/02/2015:  There has been no change in her double vision, despite increasing mestinon to 653mTID and adding prednisone 1033maily.  She is tolerating both medications well.  Because of difficulty with depth perception, she has unfortunately suffered several falls.  She is still not driving.  She admits to being very stressed lately because she and her husband are moving into a retirement community in October.  Medications:  Current Outpatient Prescriptions on File Prior to Visit  Medication  Sig Dispense Refill  . albuterol (PROAIR HFA) 108 (90 Base) MCG/ACT inhaler Inhale into the lungs.    . bupivacaine (MARCAINE) 0.25 % injection by Infiltration route once. For the bladder irrigation.    . bMarland KitchenPROPion (WELLBUTRIN SR) 200 MG 12 hr tablet Take 200 mg by mouth 2 (two) times daily.     . clonazePAM (KLONOPIN) 2 MG tablet Take 2 mg by mouth.    . DEXILANT 60 MG capsule Take 60 mg by mouth daily.  5  . ESTRACE VAGINAL 0.1 MG/GM vaginal cream   10  . famotidine (TH FAMOTIDINE 10) 10 MG tablet Take by mouth.    . Lactobacillus (LACTINEX PO) Take by mouth. Takes 2 a day    . Lidocaine, Anorectal, 5 % GEL     . magnesium hydroxide (MILK OF MAGNESIA) 400 MG/5ML suspension Take 30 mLs by mouth.    . Meclizine HCl 25 MG CHEW Chew 25 mg by mouth.    . mometasone (NASONEX) 50 MCG/ACT nasal spray Place into the nose.    . pyridostigmine (MESTINON) 60 MG tablet Take 1 tablet (60 mg total) by mouth 3 (three) times daily. 180 tablet 3  . Tiotropium Bromide Monohydrate (SPIRIVA RESPIMAT) 2.5 MCG/ACT AERS Inhale into the lungs.    . traMADol (ULTRAM) 50 MG tablet Take 50 mg by mouth every 6 (six) hours as needed.  0  . triamcinolone (NASACORT) 55 MCG/ACT AERO nasal inhaler Place into the nose.     No current facility-administered medications on file prior to visit.     Allergies:  Allergies  Allergen Reactions  . Cortisone Shortness  Of Breath, Swelling and Other (See Comments)    Tongue swelling Tongue swelling  . Amlodipine Besylate Other (See Comments)  . Chlorhexidine Gluconate Itching    Skin on face turned red Skin on face turned red  . Clindamycin Itching  . Codeine Other (See Comments)  . Dexamethasone Other (See Comments)  . Duloxetine Other (See Comments)  . Iohexol Other (See Comments)  . Morphine Sulfate Other (See Comments)  . Niacin Other (See Comments)  . Other Other (See Comments)    Pollen   . Prednisone Other (See Comments)    Causes stomach pain. Sores in her  mouth. Causes stomach pain. Sores in her mouth.  . Rosuvastatin Other (See Comments)  . Rosuvastatin Calcium Other (See Comments)    Muscle aches  . Statins Other (See Comments)    Muscle aches  . Strawberry Extract Other (See Comments)  . Sulfa Antibiotics Other (See Comments)  . Valsartan Other (See Comments)    Review of Systems:  CONSTITUTIONAL: No fevers, chills, night sweats, or weight loss.  EYES: No visual changes or eye pain ENT: No hearing changes.  No history of nose bleeds.   RESPIRATORY: No cough, wheezing and shortness of breath.   CARDIOVASCULAR: Negative for chest pain, and palpitations.   GI: Negative for abdominal discomfort, blood in stools or black stools.  No recent change in bowel habits.   GU:  No history of incontinence.   MUSCLOSKELETAL: No history of joint pain or swelling.  No myalgias.   SKIN: Negative for lesions, rash, and itching.   ENDOCRINE: Negative for cold or heat intolerance, polydipsia or goiter.   PSYCH:  + depression or anxiety symptoms.   NEURO: As Above.   Vital Signs:  BP 140/80   Pulse (!) 102   Ht 5' 4.5" (1.638 m)   Wt 163 lb (73.9 kg)   SpO2 90%   BMI 27.55 kg/m   Neurological Exam: MENTAL STATUS including orientation to time, place, person, recent and remote memory, attention span and concentration, language, and fund of knowledge is normal.  Speech is not dysarthric.  CRANIAL NERVES: No visual field defects. Pupils equal round and reactive to light.  Restricted vertical and lateral gaze on the left, otherwise normal conjugate, extra-ocular eye movements in all directions of gaze.  No ptosis. Normal facial sensation.  Face is symmetric. Palate elevates symmetrically.  Tongue is midline. Facial muscles are 5/5.  MOTOR:  Motor strength is 5/5 in all extremities.  No atrophy, fasciculations or abnormal movements.  No pronator drift.  Tone is normal.    COORDINATION/GAIT:    Gait narrow based and stable.   Data: Labs  08/26/2015:  AChR binding (0.63) blocking (28) antibody, ESR 3, CRP 4.9  MRI/A head 09/09/2015: This MR angiogram of the intracerebral arteries shows the following: 1. Very minimal atherosclerotic change within the left posterior cerebral artery that is unlikely to be clinically significant. 2. There is a normal variant with the left posterior cerebral artery obtaining its flow from the anterior circulation. 3. No aneurysms are seen. 4. No new findings compared to the 11/25/2003 MRA.   MRI orbit wwo contrast 09/09/2015:  This is is a normal MRI of the orbits with and without contrast.  IMPRESSION/PLAN: Seropositive ocular myasthenia gravis (diagnosed June 2017) with acute exacerbation Increase prednisone to 8m daily.  Discussed side effects of steroids including hyperglycemia and since she is known diabetic and not taking medications, she was instructed to follow sugars daily and share with  her PCP if they are elevated.   I will check her BMP today to see how her glucose is trending. She will also start vitamin D ([phone]?IU/day), calcium ([phone]?mg/day) and PPI Continue mestinon 88m TID Call with an update in 3 weeks  Return to clinic in 6 weeks  The duration of this appointment visit was 25 minutes of face-to-face time with the patient.  Greater than 50% of this time was spent in counseling, explanation of diagnosis, planning of further management, and coordination of care.   Thank you for allowing me to participate in patient's care.  If I can answer any additional questions, I would be pleased to do so.    Sincerely,    Rudene Poulsen K. PPosey Pronto DO

## 2015-11-02 NOTE — Patient Instructions (Addendum)
1.  Increase prednisone to 40mg  daily  2.  Continue mestinon 60mg  three times daily 3.  Start vitamin D ([phone]?IU/day) and calcium ([phone]?mg/day)  4.  Check labs 5.  Follow you sugars daily at home, if they are elevated you will need to talk to your primary care doctor  6.  Call my office in 3 weeks with an update  Return to clinic in 6 weeks

## 2015-11-03 ENCOUNTER — Encounter: Payer: Self-pay | Admitting: *Deleted

## 2015-11-03 DIAGNOSIS — N3010 Interstitial cystitis (chronic) without hematuria: Secondary | ICD-10-CM | POA: Diagnosis not present

## 2015-11-07 DIAGNOSIS — F341 Dysthymic disorder: Secondary | ICD-10-CM | POA: Diagnosis not present

## 2015-11-08 DIAGNOSIS — N3010 Interstitial cystitis (chronic) without hematuria: Secondary | ICD-10-CM | POA: Diagnosis not present

## 2015-11-09 ENCOUNTER — Other Ambulatory Visit: Payer: Self-pay | Admitting: Gastroenterology

## 2015-11-09 DIAGNOSIS — K921 Melena: Secondary | ICD-10-CM | POA: Diagnosis not present

## 2015-11-09 DIAGNOSIS — R1032 Left lower quadrant pain: Secondary | ICD-10-CM | POA: Diagnosis not present

## 2015-11-09 DIAGNOSIS — N183 Chronic kidney disease, stage 3 (moderate): Secondary | ICD-10-CM | POA: Diagnosis not present

## 2015-11-09 DIAGNOSIS — K219 Gastro-esophageal reflux disease without esophagitis: Secondary | ICD-10-CM | POA: Diagnosis not present

## 2015-11-09 DIAGNOSIS — N3011 Interstitial cystitis (chronic) with hematuria: Secondary | ICD-10-CM | POA: Diagnosis not present

## 2015-11-14 ENCOUNTER — Ambulatory Visit
Admission: RE | Admit: 2015-11-14 | Discharge: 2015-11-14 | Disposition: A | Discharge status: Home / Self Care | Payer: Medicare Other | Source: Ambulatory Visit | Attending: Gastroenterology | Admitting: Gastroenterology

## 2015-11-14 DIAGNOSIS — R1032 Left lower quadrant pain: Secondary | ICD-10-CM | POA: Diagnosis not present

## 2015-11-14 MED ORDER — IOPAMIDOL (ISOVUE-300) INJECTION 61%
100.0000 mL | Freq: Once | INTRAVENOUS | Status: AC | PRN
  Administered 2015-11-14: 100 mL via INTRAVENOUS

## 2015-11-15 DIAGNOSIS — N3010 Interstitial cystitis (chronic) without hematuria: Secondary | ICD-10-CM | POA: Diagnosis not present

## 2015-11-15 DIAGNOSIS — N3011 Interstitial cystitis (chronic) with hematuria: Secondary | ICD-10-CM | POA: Diagnosis not present

## 2015-11-15 DIAGNOSIS — G7000 Myasthenia gravis without (acute) exacerbation: Secondary | ICD-10-CM | POA: Diagnosis not present

## 2015-11-15 DIAGNOSIS — M797 Fibromyalgia: Secondary | ICD-10-CM | POA: Diagnosis not present

## 2015-11-15 DIAGNOSIS — K219 Gastro-esophageal reflux disease without esophagitis: Secondary | ICD-10-CM | POA: Diagnosis not present

## 2015-11-15 DIAGNOSIS — K5733 Diverticulitis of large intestine without perforation or abscess with bleeding: Secondary | ICD-10-CM | POA: Diagnosis not present

## 2015-11-15 DIAGNOSIS — E119 Type 2 diabetes mellitus without complications: Secondary | ICD-10-CM | POA: Diagnosis not present

## 2015-11-16 ENCOUNTER — Other Ambulatory Visit: Payer: Medicare Other

## 2015-11-17 ENCOUNTER — Other Ambulatory Visit: Payer: Medicare Other

## 2015-11-21 ENCOUNTER — Other Ambulatory Visit: Payer: Medicare Other

## 2015-11-22 DIAGNOSIS — N3010 Interstitial cystitis (chronic) without hematuria: Secondary | ICD-10-CM | POA: Diagnosis not present

## 2015-11-23 DIAGNOSIS — G7000 Myasthenia gravis without (acute) exacerbation: Secondary | ICD-10-CM | POA: Diagnosis not present

## 2015-11-23 DIAGNOSIS — K5732 Diverticulitis of large intestine without perforation or abscess without bleeding: Secondary | ICD-10-CM | POA: Diagnosis not present

## 2015-11-23 DIAGNOSIS — R1310 Dysphagia, unspecified: Secondary | ICD-10-CM | POA: Diagnosis not present

## 2015-11-24 ENCOUNTER — Telehealth: Payer: Self-pay | Admitting: Neurology

## 2015-11-24 NOTE — Telephone Encounter (Signed)
Returned to call to patient.  She reports having bloody stools and abdominal pain/cramping due to diverticulosis and was started on ciprofloxacin.  She discontinued this after seeing that this medication can make symptoms worse.  She also stopped her prednisone 40mg  and mestinon 60mg  TID due to concern her MG medications were contributing to her abdominal symptoms.  She takes PPI along with her prednisone, but there is always risk of GI ulcer/irritation, and mestinon can certainly have cholinergic side effects, so both of these medications will be held until her GI issues resolve.  Being off immunosuppresive medication, she is at risk to develop worsening MG symptoms.  Thus far, her symptoms have remained ocular and she has not noticed any benefit with prednisone 40mg  and mestinon 60mg  TID.  At this juncture, I will have her stop her prednisone until she is improved from GI standpoint, then start prednisone 20mg  daily and continue to hold mestinon.  Stressed the importance of taking PPI while on prednisone.  If she develops dyspnea or difficulty swallowing/talking, she was informed to go directly to the ER for admission.  She will need PLEX for acute treatment.  Unfortunately, with her chronic kidney disease, I do not recommend IVIG.  Patient was informed of the above plan.  All questions answered.  Erica Drake K. Posey Pronto, DO

## 2015-11-24 NOTE — Telephone Encounter (Signed)
Erica Drake 07/03/1935. She would like you to please call her at [phone]. She was taking Pyridostigmine and prednisone but had to stop them due to her stomach issues.  She was put on two medications by her [facility] Doctor Sipro and Avelox. She said she was told that the Sipro was not good to take with her disease. She would like you to please call her regarding her medications. Thank you

## 2015-11-24 NOTE — Telephone Encounter (Signed)
Please advise 

## 2015-11-28 ENCOUNTER — Telehealth: Payer: Self-pay | Admitting: Neurology

## 2015-11-28 DIAGNOSIS — J069 Acute upper respiratory infection, unspecified: Secondary | ICD-10-CM | POA: Diagnosis not present

## 2015-11-28 DIAGNOSIS — G7000 Myasthenia gravis without (acute) exacerbation: Secondary | ICD-10-CM | POA: Diagnosis not present

## 2015-11-28 NOTE — Telephone Encounter (Signed)
Please see below.

## 2015-11-28 NOTE — Telephone Encounter (Signed)
Erica Drake 03/20/1935. Her # is [id number]. She would like to speak with you regarding her Medication. She said she is also allergic to Prednisone. She would like you to please call her. She is a patient of Dr. Serita Grit. Thank you.

## 2015-11-29 DIAGNOSIS — K5733 Diverticulitis of large intestine without perforation or abscess with bleeding: Secondary | ICD-10-CM | POA: Diagnosis not present

## 2015-11-29 DIAGNOSIS — N3010 Interstitial cystitis (chronic) without hematuria: Secondary | ICD-10-CM | POA: Diagnosis not present

## 2015-11-30 ENCOUNTER — Telehealth: Payer: Self-pay | Admitting: Neurology

## 2015-11-30 NOTE — Telephone Encounter (Signed)
PT called and said she is sick and wants to know what she can take with her diagnosis that will help her get better/Dawn CB# [phone]

## 2015-11-30 NOTE — Telephone Encounter (Signed)
Message relayed to patient. Verbalized understanding and denied questions.   

## 2015-11-30 NOTE — Telephone Encounter (Signed)
Spoke with patient. She has cold like symptoms. She is loosing her voice, sore throat, a lot of mucus (that is causing nausea). Pt has taken tylenol and sudafed, with little to no relief. IS there anything else OTC she can try? Please advise.

## 2015-11-30 NOTE — Telephone Encounter (Signed)
I recommend talking to her PCP about her cold-like symptoms or pharmacist for options. As a neurologist, this is outside of my realm of expertise.

## 2015-12-01 ENCOUNTER — Emergency Department (HOSPITAL_COMMUNITY): Payer: Medicare Other

## 2015-12-01 ENCOUNTER — Encounter (HOSPITAL_COMMUNITY): Payer: Self-pay | Admitting: Emergency Medicine

## 2015-12-01 ENCOUNTER — Emergency Department (HOSPITAL_COMMUNITY)
Admission: EM | Admit: 2015-12-01 | Discharge: 2015-12-01 | Disposition: A | Discharge status: Home / Self Care | Payer: Medicare Other | Attending: Emergency Medicine | Admitting: Emergency Medicine

## 2015-12-01 DIAGNOSIS — J209 Acute bronchitis, unspecified: Secondary | ICD-10-CM | POA: Diagnosis not present

## 2015-12-01 DIAGNOSIS — Z87891 Personal history of nicotine dependence: Secondary | ICD-10-CM | POA: Insufficient documentation

## 2015-12-01 DIAGNOSIS — J45909 Unspecified asthma, uncomplicated: Secondary | ICD-10-CM | POA: Insufficient documentation

## 2015-12-01 DIAGNOSIS — R509 Fever, unspecified: Secondary | ICD-10-CM | POA: Diagnosis not present

## 2015-12-01 DIAGNOSIS — R05 Cough: Secondary | ICD-10-CM | POA: Diagnosis not present

## 2015-12-01 LAB — URINALYSIS, ROUTINE W REFLEX MICROSCOPIC
Glucose, UA: NEGATIVE mg/dL
Ketones, ur: NEGATIVE mg/dL
LEUKOCYTES UA: NEGATIVE
Nitrite: NEGATIVE
PROTEIN: NEGATIVE mg/dL
SPECIFIC GRAVITY, URINE: 1.041 — AB (ref 1.005–1.030)
pH: 5.5 (ref 5.0–8.0)

## 2015-12-01 LAB — COMPREHENSIVE METABOLIC PANEL
ALBUMIN: 3.4 g/dL — AB (ref 3.5–5.0)
ALK PHOS: 66 U/L (ref 38–126)
ALT: 15 U/L (ref 14–54)
AST: 33 U/L (ref 15–41)
Anion gap: 8 (ref 5–15)
BUN: 11 mg/dL (ref 6–20)
CHLORIDE: 104 mmol/L (ref 101–111)
CO2: 25 mmol/L (ref 22–32)
CREATININE: 1.06 mg/dL — AB (ref 0.44–1.00)
Calcium: 9 mg/dL (ref 8.9–10.3)
GFR calc Af Amer: 57 mL/min — ABNORMAL LOW (ref >60)
GFR calc non Af Amer: 49 mL/min — ABNORMAL LOW (ref >60)
GLUCOSE: 131 mg/dL — AB (ref 65–99)
Potassium: 4.5 mmol/L (ref 3.5–5.1)
SODIUM: 137 mmol/L (ref 135–145)
Total Bilirubin: 1.2 mg/dL (ref 0.3–1.2)
Total Protein: 6.6 g/dL (ref 6.5–8.1)

## 2015-12-01 LAB — CBC
HCT: 42.9 % (ref 36.0–46.0)
HEMOGLOBIN: 14.1 g/dL (ref 12.0–15.0)
MCH: 29.2 pg (ref 26.0–34.0)
MCHC: 32.9 g/dL (ref 30.0–36.0)
MCV: 88.8 fL (ref 78.0–100.0)
PLATELETS: 342 10*3/uL (ref 150–400)
RBC: 4.83 MIL/uL (ref 3.87–5.11)
RDW: 14.2 % (ref 11.5–15.5)
WBC: 11.1 10*3/uL — AB (ref 4.0–10.5)

## 2015-12-01 LAB — URINE MICROSCOPIC-ADD ON

## 2015-12-01 LAB — I-STAT CG4 LACTIC ACID, ED: Lactic Acid, Venous: 1.28 mmol/L (ref 0.5–1.9)

## 2015-12-01 MED ORDER — ALBUTEROL SULFATE (2.5 MG/3ML) 0.083% IN NEBU
5.0000 mg | INHALATION_SOLUTION | Freq: Once | RESPIRATORY_TRACT | Status: AC
  Administered 2015-12-01: 5 mg via RESPIRATORY_TRACT
  Filled 2015-12-01: qty 6

## 2015-12-01 MED ORDER — ALBUTEROL SULFATE HFA 108 (90 BASE) MCG/ACT IN AERS
2.0000 | INHALATION_SPRAY | RESPIRATORY_TRACT | Status: DC | PRN
  Administered 2015-12-01: 2 via RESPIRATORY_TRACT
  Filled 2015-12-01: qty 6.7

## 2015-12-01 MED ORDER — DOXYCYCLINE HYCLATE 100 MG PO CAPS: 100.0000 mg | ORAL_CAPSULE | Freq: Two times a day (BID) | ORAL | 0 refills | Status: DC

## 2015-12-01 NOTE — Discharge Instructions (Signed)
Doxycycline as prescribed.  Albuterol inhaler: 2 puffs every 4 hours as needed for wheezing.  Return to the ER if your symptoms significantly worsen or change.

## 2015-12-01 NOTE — ED Provider Notes (Signed)
[place] DEPT Provider Note   CSN: [id number] Arrival date & time: 12/01/15  1218     History   Chief Complaint Chief Complaint  Patient presents with  . Cough    HPI Erica Drake is a 80 y.o. female.  Patient is a 80 year old female with history of myasthenia gravis, fibromyalgia, interstitial cystitis. She presents today with a several day history of chest congestion, wheezing, productive cough, and fever. She denies any ill contacts. She denies any chest pain.   The history is provided by the patient.  Cough  This is a new problem. Episode onset: 5 days ago. The problem occurs constantly. The problem has been gradually worsening. The cough is productive of sputum. The maximum temperature recorded prior to her arrival was 100 to 100.9 F. Associated symptoms include chills. Pertinent negatives include no chest pain. She has tried nothing for the symptoms. She is not a smoker.    Past Medical History:  Diagnosis Date  . Allergic rhinitis   . Chronic bronchitis    some mild wheezing at change of weather-  . Chronic fatigue   . Decreased hearing    intol hearing aids. Deaf left ear, hearing aid right ear.  . Eustachian tube dysfunction    decreased hearing  . Fibromyalgia   . GERD (gastroesophageal reflux disease)    Nissen  . Interstitial cystitis    [facility]-Dr. Karsten Ro sees periodically.(occ. self caths at home)  . Vertigo     Patient Active Problem List   Diagnosis Date Noted  . Myasthenia gravis with (acute) exacerbation ([place]) 09/29/2015  . Chronic cholecystitis 01/14/2015  . Angioedema 05/23/2014  . INSOMNIA 05/31/2007  . EUSTACHIAN TUBE DYSFUNCTION 04/30/2007  . Allergic rhinitis 04/30/2007  . Asthmatic bronchitis without complication [id number]  . Esophageal reflux 04/30/2007  . Fibromyalgia 04/30/2007    Past Surgical History:  Procedure Laterality Date  . CHOLECYSTECTOMY N/A 01/14/2015   Procedure: LAPAROSCOPIC CHOLECYSTECTOMY WITH  INTRAOPERATIVE CHOLANGIOGRAM;  Surgeon: Johnathan Hausen, MD;  Location: [facility];  Service: General;  Laterality: N/A;  . NISSEN FUNDOPLICATION      OB History    No data available       Home Medications    Prior to Admission medications   Medication Sig Start Date End Date Taking? Authorizing Provider  albuterol (PROAIR HFA) 108 (90 Base) MCG/ACT inhaler Inhale into the lungs.    Historical Provider, MD  bupivacaine (MARCAINE) 0.25 % injection by Infiltration route once. For the bladder irrigation.    Historical Provider, MD  buPROPion (WELLBUTRIN SR) 200 MG 12 hr tablet Take 200 mg by mouth 2 (two) times daily.     Historical Provider, MD  clonazePAM (KLONOPIN) 2 MG tablet Take 2 mg by mouth.    Historical Provider, MD  DEXILANT 60 MG capsule Take 60 mg by mouth daily. 11/09/14   Historical Provider, MD  ESTRACE VAGINAL 0.1 MG/GM vaginal cream  08/04/15   Historical Provider, MD  famotidine (TH FAMOTIDINE 10) 10 MG tablet Take by mouth.    Historical Provider, MD  Lactobacillus (LACTINEX PO) Take by mouth. Takes 2 a day    Historical Provider, MD  Lidocaine, Anorectal, 5 % GEL     Historical Provider, MD  magnesium hydroxide (MILK OF MAGNESIA) 400 MG/5ML suspension Take 30 mLs by mouth.    Historical Provider, MD  Meclizine HCl 25 MG CHEW Chew 25 mg by mouth.    Historical Provider, MD  mometasone (NASONEX) 50 MCG/ACT nasal spray Place into  the nose.    Historical Provider, MD  predniSONE (DELTASONE) 20 MG tablet Take 2 tablets (40 mg total) by mouth daily. 11/02/15   Donika K Patel, DO  pyridostigmine (MESTINON) 60 MG tablet Take 1 tablet (60 mg total) by mouth 3 (three) times daily. 10/25/15   Donika Keith Rake, DO  Tiotropium Bromide Monohydrate (SPIRIVA RESPIMAT) 2.5 MCG/ACT AERS Inhale into the lungs. 01/15/15   Historical Provider, MD  traMADol (ULTRAM) 50 MG tablet Take 50 mg by mouth every 6 (six) hours as needed. 08/02/15   Historical Provider, MD  triamcinolone (NASACORT) 55 MCG/ACT AERO  nasal inhaler Place into the nose.    Historical Provider, MD    Family History Family History  Problem Relation Age of Onset  . Adopted: Yes  . Asthma Mother     biologic  . Asthma Son   . Stroke Neg Hx   . Neuropathy Neg Hx   . Multiple sclerosis Neg Hx     Social History Social History  Substance Use Topics  . Smoking status: Former Research scientist (life sciences)  . Smokeless tobacco: Former Systems developer    Quit date: 01/09/1974  . Alcohol use No     Allergies   Cortisone; Amlodipine besylate; Chlorhexidine gluconate; Clindamycin; Codeine; Dexamethasone; Duloxetine; Morphine sulfate; Niacin; Other; Prednisone; Rosuvastatin; Rosuvastatin calcium; Statins; Strawberry extract; Sulfa antibiotics; and Valsartan   Review of Systems Review of Systems  Constitutional: Positive for chills.  Respiratory: Positive for cough.   Cardiovascular: Negative for chest pain.  All other systems reviewed and are negative.    Physical Exam Updated Vital Signs BP 184/81 (BP Location: Right Arm)   Pulse (!) 125   Temp 98.1 F (36.7 C) (Oral)   Resp 21   SpO2 94%   Physical Exam  Constitutional: She is oriented to person, place, and time. She appears well-developed and well-nourished. No distress.  HENT:  Head: Normocephalic and atraumatic.  Neck: Normal range of motion. Neck supple.  Cardiovascular: Normal rate and regular rhythm.  Exam reveals no gallop and no friction rub.   No murmur heard. Pulmonary/Chest: Effort normal. No respiratory distress. She has wheezes.  There are rhonchorous breath sounds bilaterally.  Abdominal: Soft. Bowel sounds are normal. She exhibits no distension. There is no tenderness.  Musculoskeletal: Normal range of motion. She exhibits no edema.  Neurological: She is alert and oriented to person, place, and time.  Skin: Skin is warm and dry. She is not diaphoretic.  Nursing note and vitals reviewed.    ED Treatments / Results  Labs (all labs ordered are listed, but only  abnormal results are displayed) Labs Reviewed  CBC  URINALYSIS, ROUTINE W REFLEX MICROSCOPIC (NOT AT [facility])  COMPREHENSIVE METABOLIC PANEL  I-STAT CG4 LACTIC ACID, ED    EKG  EKG Interpretation  Date/Time:  Thursday December 01 2015 12:26:23 EDT Ventricular Rate:  113 PR Interval:    QRS Duration: 80 QT Interval:  325 QTC Calculation: 446 R Axis:   64 Text Interpretation:  Sinus tachycardia Consider right atrial enlargement Anteroseptal infarct, old Minimal ST depression, lateral leads Confirmed by Ziyad Dyar  MD, Dempsey Ahonen ([id number]) on 12/01/2015 12:35:05 PM       Radiology No results found.  Procedures Procedures (including critical care time)  Medications Ordered in ED Medications  albuterol (PROVENTIL) (2.5 MG/3ML) 0.083% nebulizer solution 5 mg (not administered)     Initial Impression / Assessment and Plan / ED Course  I have reviewed the triage vital signs and the nursing notes.  Pertinent  labs & imaging results that were available during my care of the patient were reviewed by me and considered in my medical decision making (see chart for details).  Clinical Course    Workup reveals no evidence for pneumonia. She appears to have a rhonchi to switch treated with an antibiotic and when necessary return. There is no hypoxia and laboratory studies are otherwise unremarkable. Cardiac workup is unremarkable.  Final Clinical Impressions(s) / ED Diagnoses   Final diagnoses:  None    New Prescriptions New Prescriptions   No medications on file     Veryl Speak, MD 12/01/15 1454

## 2015-12-01 NOTE — ED Notes (Signed)
Patient verbalized understanding of discharge instructions and denies any further needs or questions at this time. VS stable. Patient ambulatory with steady gait. RN escorted to ED entrance in wheelchair.   

## 2015-12-01 NOTE — ED Triage Notes (Addendum)
Pt from home via [facility] with c/o productive yellow cough starting last night with a fever at home of 99.7.  Pt reports nasal drainage and congestion with a sore throat.  Recent prednisone treatment for myasthenia gravis.  Rhonchi heard in left lower lobe per EMS.  ST on monitor.  Took 1000 mg tylenol at 1145.  Pt ambulatory, NAD, A&O.

## 2015-12-01 NOTE — Discharge Planning (Signed)
EDCM reviewed discharging chart for possible CM needs.  No needs identified.    

## 2015-12-01 NOTE — ED Notes (Signed)
Patient transported to X-ray 

## 2015-12-02 ENCOUNTER — Ambulatory Visit: Payer: Medicare Other | Admitting: Neurology

## 2015-12-02 NOTE — Progress Notes (Deleted)
Follow-up Visit   Date: 12/02/15    Erica Drake MRN: [id number] DOB: Apr 14, 1935   Interim History: Erica Drake is a 80 y.o. right-handed Caucasian female with fibromyalgia, GERD, depression, left sensorineural deficit, and interstitial cystitis, type 2 diabetes mellitus returning to the clinic for follow-up of seropositive myasthenia gravis.  The patient was accompanied to the clinic by self.  History of present illness: Initial visit 09/29/2015:  In May 2017, she began experiencing double vision which are located vertical and diagonal to each other.  She has noticed mornings are much better, but as the day progresses, her double vision gets worsen.  She endorses mild changes to her speech.  No difficulty with swallowing or shortness of breath.  She also complains of blurry vision in her right eye. The double vision has interfered with her driving, so she stopped this 2 months ago. She has seen her ENT, PCP, ophthalmologist, and then saw Dr. Jaynee Eagles in June who ordered MRI/A head and orbit which was normal.  Her acetylcholine antibodies returned positive.  A prescription for mestinon 59m three times daily was ordered, but patient did not start it.  She has many drug intolerances/allergies, including cortisone, so medications were started at a low dose.   She had many other chronic medical problems including chronic pain related to fibromyalgia, arthritis, and interstitial cystitis.     UPDATE 11/02/2015:  There has been no change in her double vision, despite increasing mestinon to 658mTID and adding prednisone 1021maily.  She is tolerating both medications well.  Because of difficulty with depth perception, she has unfortunately suffered several falls.  She is still not driving.  She admits to being very stressed lately because she and her husband are moving into a retirement community in October.  Medications:  Current Outpatient Prescriptions on File Prior to Visit  Medication  Sig Dispense Refill  . albuterol (PROAIR HFA) 108 (90 Base) MCG/ACT inhaler Inhale into the lungs.    . bupivacaine (MARCAINE) 0.25 % injection by Infiltration route once. For the bladder irrigation.    . bMarland KitchenPROPion (WELLBUTRIN SR) 200 MG 12 hr tablet Take 200 mg by mouth 2 (two) times daily.     . clonazePAM (KLONOPIN) 2 MG tablet Take 2 mg by mouth.    . DEXILANT 60 MG capsule Take 60 mg by mouth daily.  5  . doxycycline (VIBRAMYCIN) 100 MG capsule Take 1 capsule (100 mg total) by mouth 2 (two) times daily. 20 capsule 0  . ESTRACE VAGINAL 0.1 MG/GM vaginal cream   10  . famotidine (TH FAMOTIDINE 10) 10 MG tablet Take by mouth.    . Lactobacillus (LACTINEX PO) Take by mouth. Takes 2 a day    . Lidocaine, Anorectal, 5 % GEL     . magnesium hydroxide (MILK OF MAGNESIA) 400 MG/5ML suspension Take 30 mLs by mouth.    . Meclizine HCl 25 MG CHEW Chew 25 mg by mouth.    . mometasone (NASONEX) 50 MCG/ACT nasal spray Place into the nose.    . predniSONE (DELTASONE) 20 MG tablet Take 2 tablets (40 mg total) by mouth daily. 60 tablet 5  . pyridostigmine (MESTINON) 60 MG tablet Take 1 tablet (60 mg total) by mouth 3 (three) times daily. 180 tablet 3  . Tiotropium Bromide Monohydrate (SPIRIVA RESPIMAT) 2.5 MCG/ACT AERS Inhale into the lungs.    . traMADol (ULTRAM) 50 MG tablet Take 50 mg by mouth every 6 (six) hours as needed.  0  . triamcinolone (NASACORT) 55 MCG/ACT AERO nasal inhaler Place into the nose.     No current facility-administered medications on file prior to visit.     Allergies:  Allergies  Allergen Reactions  . Cortisone Shortness Of Breath, Swelling and Other (See Comments)    Tongue swelling Tongue swelling  . Amlodipine Besylate Other (See Comments)  . Chlorhexidine Gluconate Itching    Skin on face turned red Skin on face turned red  . Clindamycin Itching  . Codeine Other (See Comments)  . Dexamethasone Other (See Comments)  . Duloxetine Other (See Comments)  . Morphine  Sulfate Other (See Comments)  . Niacin Other (See Comments)  . Other Other (See Comments)    Pollen   . Prednisone Other (See Comments)    Causes stomach pain. Sores in her mouth. Causes stomach pain. Sores in her mouth.  . Rosuvastatin Other (See Comments)  . Rosuvastatin Calcium Other (See Comments)    Muscle aches  . Statins Other (See Comments)    Muscle aches  . Strawberry Extract Other (See Comments)  . Sulfa Antibiotics Other (See Comments)  . Valsartan Other (See Comments)    Review of Systems:  CONSTITUTIONAL: No fevers, chills, night sweats, or weight loss.  EYES: No visual changes or eye pain ENT: No hearing changes.  No history of nose bleeds.   RESPIRATORY: No cough, wheezing and shortness of breath.   CARDIOVASCULAR: Negative for chest pain, and palpitations.   GI: Negative for abdominal discomfort, blood in stools or black stools.  No recent change in bowel habits.   GU:  No history of incontinence.   MUSCLOSKELETAL: No history of joint pain or swelling.  No myalgias.   SKIN: Negative for lesions, rash, and itching.   ENDOCRINE: Negative for cold or heat intolerance, polydipsia or goiter.   PSYCH:  + depression or anxiety symptoms.   NEURO: As Above.   Vital Signs:  There were no vitals taken for this visit.  Neurological Exam: MENTAL STATUS including orientation to time, place, person, recent and remote memory, attention span and concentration, language, and fund of knowledge is normal.  Speech is not dysarthric.  CRANIAL NERVES: No visual field defects. Pupils equal round and reactive to light.  Restricted vertical and lateral gaze on the left, otherwise normal conjugate, extra-ocular eye movements in all directions of gaze.  No ptosis. Normal facial sensation.  Face is symmetric. Palate elevates symmetrically.  Tongue is midline. Facial muscles are 5/5.  MOTOR:  Motor strength is 5/5 in all extremities.  No atrophy, fasciculations or abnormal movements.  No  pronator drift.  Tone is normal.    COORDINATION/GAIT:    Gait narrow based and stable.   Data: Labs 08/26/2015:  AChR binding (0.63) blocking (28) antibody, ESR 3, CRP 4.9  MRI/A head 09/09/2015: This MR angiogram of the intracerebral arteries shows the following: 1. Very minimal atherosclerotic change within the left posterior cerebral artery that is unlikely to be clinically significant. 2. There is a normal variant with the left posterior cerebral artery obtaining its flow from the anterior circulation. 3. No aneurysms are seen. 4. No new findings compared to the 11/25/2003 MRA.   MRI orbit wwo contrast 09/09/2015:  This is is a normal MRI of the orbits with and without contrast.  IMPRESSION/PLAN: Seropositive ocular myasthenia gravis (diagnosed June 2017) with acute exacerbation Increase prednisone to 77m daily.  Discussed side effects of steroids including hyperglycemia and since she is known diabetic and not  taking medications, she was instructed to follow sugars daily and share with her PCP if they are elevated.   I will check her BMP today to see how her glucose is trending. She will also start vitamin D [phone]?IU/day), calcium [phone]?mg/day) and PPI Continue mestinon 2m TID Call with an update in 3 weeks  Return to clinic in 6 weeks  The duration of this appointment visit was 25 minutes of face-to-face time with the patient.  Greater than 50% of this time was spent in counseling, explanation of diagnosis, planning of further management, and coordination of care.   Thank you for allowing me to participate in patient's care.  If I can answer any additional questions, I would be pleased to do so.    Sincerely,    Donika K. PPosey Pronto DO

## 2015-12-02 NOTE — Telephone Encounter (Signed)
Patient had appt today. No showed appt. See previous message and ER visit.

## 2015-12-06 DIAGNOSIS — K580 Irritable bowel syndrome with diarrhea: Secondary | ICD-10-CM | POA: Diagnosis not present

## 2015-12-06 DIAGNOSIS — E119 Type 2 diabetes mellitus without complications: Secondary | ICD-10-CM | POA: Diagnosis not present

## 2015-12-06 DIAGNOSIS — K219 Gastro-esophageal reflux disease without esophagitis: Secondary | ICD-10-CM | POA: Diagnosis not present

## 2015-12-06 DIAGNOSIS — M797 Fibromyalgia: Secondary | ICD-10-CM | POA: Diagnosis not present

## 2015-12-06 DIAGNOSIS — K5792 Diverticulitis of intestine, part unspecified, without perforation or abscess without bleeding: Secondary | ICD-10-CM | POA: Diagnosis not present

## 2015-12-06 DIAGNOSIS — J209 Acute bronchitis, unspecified: Secondary | ICD-10-CM | POA: Diagnosis not present

## 2015-12-06 DIAGNOSIS — G7000 Myasthenia gravis without (acute) exacerbation: Secondary | ICD-10-CM | POA: Diagnosis not present

## 2015-12-06 DIAGNOSIS — N183 Chronic kidney disease, stage 3 (moderate): Secondary | ICD-10-CM | POA: Diagnosis not present

## 2015-12-06 DIAGNOSIS — N3010 Interstitial cystitis (chronic) without hematuria: Secondary | ICD-10-CM | POA: Diagnosis not present

## 2015-12-13 DIAGNOSIS — N3010 Interstitial cystitis (chronic) without hematuria: Secondary | ICD-10-CM | POA: Diagnosis not present

## 2015-12-20 DIAGNOSIS — N3010 Interstitial cystitis (chronic) without hematuria: Secondary | ICD-10-CM | POA: Diagnosis not present

## 2015-12-21 ENCOUNTER — Encounter (HOSPITAL_COMMUNITY): Admission: RE | Payer: Self-pay | Source: Ambulatory Visit

## 2015-12-21 ENCOUNTER — Ambulatory Visit (HOSPITAL_COMMUNITY): Admission: RE | Admit: 2015-12-21 | Payer: Medicare Other | Source: Ambulatory Visit | Admitting: Orthopedic Surgery

## 2015-12-21 ENCOUNTER — Telehealth: Payer: Self-pay | Admitting: Internal Medicine

## 2015-12-21 SURGERY — ARTHROSCOPY, KNEE
Anesthesia: Choice | Laterality: Left

## 2015-12-21 NOTE — Telephone Encounter (Signed)
Pt requesting appt to see CY only to follow up on bronchitis. Pt refused appt with TP or any other provider in office.  No close upcoming appts available fora 30 min slot.  Please advise if pt can be worked in somewhere.  Thanks.

## 2015-12-22 NOTE — Telephone Encounter (Signed)
Attempted to contact pt. Received a fast busy signal Will try back. 

## 2015-12-22 NOTE — Telephone Encounter (Signed)
12/28/15 10:30am slot can be used (15 minutes okay)

## 2015-12-22 NOTE — Telephone Encounter (Signed)
Erica Drake- ok to find her 15 minutes

## 2015-12-27 NOTE — Telephone Encounter (Signed)
Offered pt appt on 12/28/15 @ 10:30 per Katie's note- pt states that she has another appointment that day around the same time and would like to see if there was an alternative - She can be reached at [phone]

## 2015-12-27 NOTE — Telephone Encounter (Signed)
KW please advise. Thanks.

## 2015-12-27 NOTE — Telephone Encounter (Signed)
LMTCB with husband as patient was not there at time of my call; pt to call back-we can offer Thursday 12/29/15 at 3:30pm slot(15 minutes okay). If patient is unable to take that appt then she will need to see NP since CY is slowing down. Thanks.

## 2015-12-27 NOTE — Telephone Encounter (Signed)
lmomtcb x 2  

## 2015-12-28 ENCOUNTER — Other Ambulatory Visit: Payer: Self-pay | Admitting: *Deleted

## 2015-12-28 ENCOUNTER — Ambulatory Visit (INDEPENDENT_AMBULATORY_CARE_PROVIDER_SITE_OTHER): Payer: Medicare Other | Admitting: Neurology

## 2015-12-28 ENCOUNTER — Encounter: Payer: Self-pay | Admitting: Neurology

## 2015-12-28 VITALS — BP 120/70 | Ht 64.5 in | Wt 155.2 lb

## 2015-12-28 DIAGNOSIS — H532 Diplopia: Secondary | ICD-10-CM

## 2015-12-28 DIAGNOSIS — G7001 Myasthenia gravis with (acute) exacerbation: Secondary | ICD-10-CM | POA: Diagnosis not present

## 2015-12-28 DIAGNOSIS — G7000 Myasthenia gravis without (acute) exacerbation: Secondary | ICD-10-CM

## 2015-12-28 MED ORDER — PREDNISONE 20 MG PO TABS: 20.0000 mg | ORAL_TABLET | Freq: Every day | ORAL | 5 refills | Status: DC

## 2015-12-28 NOTE — Progress Notes (Signed)
Follow-up Visit   Date: 12/28/15    Erica Drake MRN: [id number] DOB: 12/09/1935   Interim History: Erica Drake is a 80 y.o. right-handed Caucasian female with fibromyalgia, GERD, depression, left sensorineural deficit, and interstitial cystitis, type 2 diabetes mellitus returning to the clinic for follow-up of seropositive myasthenia gravis.  The patient was accompanied to the clinic by self.  History of present illness: Initial visit 09/29/2015:  In May 2017, she began experiencing double vision which are located vertical and diagonal to each other.  She has noticed mornings are much better, but as the day progresses, her double vision gets worsen.  She endorses mild changes to her speech.  No difficulty with swallowing or shortness of breath.  She also complains of blurry vision in her right eye. The double vision has interfered with her driving, so she stopped this 2 months ago. She has seen her ENT, PCP, ophthalmologist, and then saw Dr. Jaynee Eagles in June who ordered MRI/A head and orbit which was normal.  Her acetylcholine antibodies returned positive.  A prescription for mestinon 59m three times daily was ordered, but patient did not start it.  She has many drug intolerances/allergies, including cortisone, so medications were started at a low dose.   She had many other chronic medical problems including chronic pain related to fibromyalgia, arthritis, and interstitial cystitis.    UPDATE 11/02/2015:  There has been no change in her double vision, despite increasing mestinon to 661mTID and adding prednisone 1047maily.  She is tolerating both medications well.  Because of difficulty with depth perception, she has unfortunately suffered several falls.  She is still not driving.  She admits to being very stressed lately because she and her husband are moving into a retirement community in October.  UPDATE 12/28/2015:  She stopped taking her prednisone because she has a number of  other problems including diverticulosis and bronchitis, which she thought would be worsened by prednisone.. DMarland Kitchenspite stopping her prednisone, there has been no change in her double vision.  Today, she complains of left eye pain and blurry vision. She continues to have double vision which is improved with using an eye patch.  No other extra-ocular MG complaints.   Medications:  Current Outpatient Prescriptions on File Prior to Visit  Medication Sig Dispense Refill  . albuterol (PROAIR HFA) 108 (90 Base) MCG/ACT inhaler Inhale into the lungs.    . bupivacaine (MARCAINE) 0.25 % injection by Infiltration route once. For the bladder irrigation.    . bMarland KitchenPROPion (WELLBUTRIN SR) 200 MG 12 hr tablet Take 200 mg by mouth 2 (two) times daily.     . clonazePAM (KLONOPIN) 2 MG tablet Take 2 mg by mouth.    . DEXILANT 60 MG capsule Take 60 mg by mouth daily.  5  . doxycycline (VIBRAMYCIN) 100 MG capsule Take 1 capsule (100 mg total) by mouth 2 (two) times daily. 20 capsule 0  . ESTRACE VAGINAL 0.1 MG/GM vaginal cream   10  . famotidine (TH FAMOTIDINE 10) 10 MG tablet Take by mouth.    . Lactobacillus (LACTINEX PO) Take by mouth. Takes 2 a day    . Lidocaine, Anorectal, 5 % GEL     . magnesium hydroxide (MILK OF MAGNESIA) 400 MG/5ML suspension Take 30 mLs by mouth.    . Meclizine HCl 25 MG CHEW Chew 25 mg by mouth.    . mometasone (NASONEX) 50 MCG/ACT nasal spray Place into the nose.    . predniSONE (  DELTASONE) 20 MG tablet Take 2 tablets (40 mg total) by mouth daily. 60 tablet 5  . pyridostigmine (MESTINON) 60 MG tablet Take 1 tablet (60 mg total) by mouth 3 (three) times daily. 180 tablet 3  . Tiotropium Bromide Monohydrate (SPIRIVA RESPIMAT) 2.5 MCG/ACT AERS Inhale into the lungs.    . traMADol (ULTRAM) 50 MG tablet Take 50 mg by mouth every 6 (six) hours as needed.  0  . triamcinolone (NASACORT) 55 MCG/ACT AERO nasal inhaler Place into the nose.     No current facility-administered medications on file  prior to visit.     Allergies:  Allergies  Allergen Reactions  . Cortisone Shortness Of Breath, Swelling and Other (See Comments)    Tongue swelling Tongue swelling  . Amlodipine Besylate Other (See Comments)  . Chlorhexidine Gluconate Itching    Skin on face turned red Skin on face turned red  . Clindamycin Itching  . Codeine Other (See Comments)  . Dexamethasone Other (See Comments)  . Duloxetine Other (See Comments)  . Morphine Sulfate Other (See Comments)  . Niacin Other (See Comments)  . Other Other (See Comments)    Pollen   . Prednisone Other (See Comments)    Causes stomach pain. Sores in her mouth. Causes stomach pain. Sores in her mouth.  . Rosuvastatin Other (See Comments)  . Rosuvastatin Calcium Other (See Comments)    Muscle aches  . Statins Other (See Comments)    Muscle aches  . Strawberry Extract Other (See Comments)  . Sulfa Antibiotics Other (See Comments)  . Valsartan Other (See Comments)    Review of Systems:  CONSTITUTIONAL: No fevers, chills, night sweats, or weight loss.  EYES: No visual changes or eye pain ENT: No hearing changes.  No history of nose bleeds.   RESPIRATORY: No cough, wheezing and shortness of breath.   CARDIOVASCULAR: Negative for chest pain, and palpitations.   GI: Negative for abdominal discomfort, blood in stools or black stools.  No recent change in bowel habits.   GU:  No history of incontinence.   MUSCLOSKELETAL: No history of joint pain or swelling.  No myalgias.   SKIN: Negative for lesions, rash, and itching.   ENDOCRINE: Negative for cold or heat intolerance, polydipsia or goiter.   PSYCH:  + depression or anxiety symptoms.   NEURO: As Above.   Vital Signs:  BP 120/70   Ht 5' 4.5" (1.638 m)   Wt 155 lb 4 oz (70.4 kg)   BMI 26.24 kg/m   Neurological Exam: MENTAL STATUS including orientation to time, place, person, recent and remote memory, attention span and concentration, language, and fund of knowledge is  normal.  Speech is not dysarthric.  CRANIAL NERVES: No visual field defects. Pupils equal round and reactive to light.  There is no opthalmoplegia on exam today. Facial muscles are 5/5.   No ptosis. Normal facial sensation.  Face is symmetric. Palate elevates symmetrically.  Tongue is midline. Facial muscles are 5/5.  MOTOR:  Motor strength is 5/5 in all extremities.    COORDINATION/GAIT:    Gait narrow based and stable.   Data: Labs 08/26/2015:  AChR binding (0.63) blocking (28) antibody, ESR 3, CRP 4.9  MRI/A head 09/09/2015: This MR angiogram of the intracerebral arteries shows the following: 1. Very minimal atherosclerotic change within the left posterior cerebral artery that is unlikely to be clinically significant. 2. There is a normal variant with the left posterior cerebral artery obtaining its flow from the anterior circulation. 3.  No aneurysms are seen. 4. No new findings compared to the 11/25/2003 MRA.   MRI orbit wwo contrast 09/09/2015:  This is is a normal MRI of the orbits with and without contrast.  IMPRESSION/PLAN: Seropositive ocular myasthenia gravis (diagnosed June 2017) with acute exacerbation She unfortunately has numerous medication intolerances and has a number of medication conditions and trying to balance treatment of each of these is challenging.  She did not wish to take her prednisone during any infection, so discontinued this.  Despite being on mestinon 24m TID and prednisone 456m she never appreciated any changes.  Review of her labs shows mildly elevated AChR antibody titers, which makes me question the diagnosis again.  She does not have ptosis or opthlamoplegia today, but complains of "blurry vision" and eye pain, which would not be expected with MG.  I will order NCS/EMG of the left side to confirm NMJ disorder.  She will need to stop her mestinon for this.  In the meantime, she is agreeable to starting prednisone to 2067maily. Continue  mestinon 62m59mD.    Unfortunately, we are very limited with acute management options in her case.  She is not a candidate for IVIG because of renal dysfunction and the only other alternative would be PLEX.  We discussed using steroid-sparing medications and TMPT level for Imuran will be checked, but these will take up to 6 months to see clinical response.  Continue vitamin D ([phone]?IU/day), calcium ([phone]?mg/day) and PPI  Call with an update in 3 weeks  Return to clinic in 6 weeks  The duration of this appointment visit was 25 minutes of face-to-face time with the patient.  Greater than 50% of this time was spent in counseling, explanation of diagnosis, planning of further management, and coordination of care.   Thank you for allowing me to participate in patient's care.  If I can answer any additional questions, I would be pleased to do so.    Sincerely,    Hodan Wurtz K. PatePosey Pronto

## 2015-12-28 NOTE — Patient Instructions (Addendum)
1.  Start prednisone 20mg  daily 2.  Continue mestinon 60mg  three times daily 3.  NCS/EMG of the left side - MG protocol  4.  Recommend eye evaluation for blurry vision  Return to clinic on December 21st

## 2015-12-28 NOTE — Telephone Encounter (Signed)
Pt aware of the appt 

## 2015-12-29 ENCOUNTER — Ambulatory Visit (INDEPENDENT_AMBULATORY_CARE_PROVIDER_SITE_OTHER): Payer: Medicare Other | Admitting: Internal Medicine

## 2015-12-29 ENCOUNTER — Encounter: Payer: Self-pay | Admitting: Internal Medicine

## 2015-12-29 DIAGNOSIS — J4531 Mild persistent asthma with (acute) exacerbation: Secondary | ICD-10-CM

## 2015-12-29 DIAGNOSIS — K219 Gastro-esophageal reflux disease without esophagitis: Secondary | ICD-10-CM | POA: Diagnosis not present

## 2015-12-29 DIAGNOSIS — J3089 Other allergic rhinitis: Secondary | ICD-10-CM | POA: Diagnosis not present

## 2015-12-29 DIAGNOSIS — Z23 Encounter for immunization: Secondary | ICD-10-CM

## 2015-12-29 DIAGNOSIS — J302 Other seasonal allergic rhinitis: Secondary | ICD-10-CM

## 2015-12-29 DIAGNOSIS — G7001 Myasthenia gravis with (acute) exacerbation: Secondary | ICD-10-CM | POA: Diagnosis not present

## 2015-12-29 MED ORDER — GLYCOPYRROLATE-FORMOTEROL 9-4.8 MCG/ACT IN AERO: 2.0000 | INHALATION_SPRAY | Freq: Two times a day (BID) | RESPIRATORY_TRACT | 0 refills | Status: DC

## 2015-12-29 NOTE — Assessment & Plan Note (Signed)
Emphasized continued attention to antireflux measures. Reminded that reflux can be a significant trigger for irritable airways, cough and wheeze.

## 2015-12-29 NOTE — Patient Instructions (Signed)
Flu vax- senior  Samples x 2 Bevespi maintenance inhaler     Inhale 2 puffs twice daily  Ok still to use the yellow Proventil rescue inhaler if needed  We are not using Spiriva now.

## 2015-12-29 NOTE — Progress Notes (Signed)
05/10/14- 80 yoF former smoker last seen 2012, followed for asthma, allergic rhinitis, bronchitis, eustachian tube dysfunction, complicated by GERD, impaired hearing, fibromyalgia, insomnia  Coming to re-establish for allergies; She is specifically concerned about a possible reaction to cortisone injection given for back pain. She remembers prednisone once causing stomachache. Recent injection, presumably methylprednisolone, associated with malaise. On repeat she says tongue swollen for a day or more. History of GERD requiring Pepcid AC six daily.   12/29/2015-80 year old female former female former smoker followed for Asthma, Allergic rhinitis, chronic bronchitis, eustachian tube dysfunction, complicated by GERD, impaired hearing, fibromyalgia, insomnia, Myasthenia Gravis FOLLOWS  FOR: Pt. c/o wheezing alot,coughing unable to produce anything,  she wanted to know if she can get allergy shots again Now diagnosed with Myasthenia Gravis and on maintenance prednisone 20 mg daily for the past month.. Has Proventil inhaler but no longer has Spiriva. Has nebulizer solution but her machine is boxed up waiting for moved to [place]. CXR 12/01/2015 IMPRESSION: There is no edema or consolidation. Heart size and pulmonary vascularity are normal. No adenopathy. There is degenerative change in the thoracic spine.  ROS-see HPI   Negative unless "+" Constitutional:    weight loss, night sweats, fevers, chills, fatigue, lassitude. HEENT:    headaches, difficulty swallowing, +tooth/dental problems, sore throat,       sneezing, itching, ear ache, nasal congestion, post nasal drip, snoring CV:    chest pain, orthopnea, PND, swelling in lower extremities, anasarca,                                                        dizziness, +palpitations Resp:   shortness of breath with exertion or at rest.               + productive cough,   non-productive cough, coughing up of blood.              change in color of mucus.  wheezing.    Skin:    rash or lesions. GI: +heartburn, +indigestion, abdominal pain, nausea, vomiting, diarrhea,                 change in bowel habits, loss of appetite GU: dysuria, change in color of urine, no urgency or frequency.   flank pain. MS:   +joint pain, stiffness, decreased range of motion, back pain. Neuro-     +diplopia Psych:  change in mood or affect.  depression or +anxiety.   memory loss.  OBJ- Physical Exam General- Alert, Oriented, Affect-appropriate, Distress- none acute Skin- rash-none, lesions- none, excoriation- none Lymphadenopathy- none Head- atraumatic            Eyes- + patch over left eye diplopia            Ears- +HOH            Nose- Clear, no-Septal dev, mucus, polyps, erosion, perforation             Throat- Mallampati II , mucosa clear , drainage- none, tonsils- atrophic Neck- flexible , trachea midline, no stridor , thyroid nl, carotid no bruit Chest - symmetrical excursion , unlabored           Heart/CV- RRR , no murmur , no gallop  , no rub, nl s1 s2                           -  JVD- none , edema- none, stasis changes- none, varices- none           Lung-  cough + wheezy , dullness-none, rub- none           Chest wall-  Abd-  Br/ Gen/ Rectal- Not done, not indicated Extrem- cyanosis- none, clubbing, none, atrophy- none, strength- nl Neuro- grossly intact to observation

## 2015-12-29 NOTE — Assessment & Plan Note (Addendum)
Continue rescue inhaler as needed. Try adding sample Bevespi maintenance inhaler to see if this helps. She is currently on prednisone 20 mg daily maintenance and I don't think adding a steroid inhaler component will make any difference.

## 2015-12-29 NOTE — Assessment & Plan Note (Signed)
She is now on prednisone 20 mg daily. I suggested she wait until her myasthenia status is stabilized. Then if she needs to she couldn't seek evaluation at one of the allergy practices in town as discussed, since our vaccine program will be closing.

## 2015-12-29 NOTE — Assessment & Plan Note (Signed)
Patch over left eye for diplopia. Now on prednisone 20 mg daily per neurology

## 2016-01-02 DIAGNOSIS — M797 Fibromyalgia: Secondary | ICD-10-CM | POA: Diagnosis not present

## 2016-01-02 DIAGNOSIS — J42 Unspecified chronic bronchitis: Secondary | ICD-10-CM | POA: Diagnosis not present

## 2016-01-02 DIAGNOSIS — K580 Irritable bowel syndrome with diarrhea: Secondary | ICD-10-CM | POA: Diagnosis not present

## 2016-01-02 DIAGNOSIS — N183 Chronic kidney disease, stage 3 (moderate): Secondary | ICD-10-CM | POA: Diagnosis not present

## 2016-01-02 DIAGNOSIS — K219 Gastro-esophageal reflux disease without esophagitis: Secondary | ICD-10-CM | POA: Diagnosis not present

## 2016-01-02 DIAGNOSIS — N3011 Interstitial cystitis (chronic) with hematuria: Secondary | ICD-10-CM | POA: Diagnosis not present

## 2016-01-02 DIAGNOSIS — G7000 Myasthenia gravis without (acute) exacerbation: Secondary | ICD-10-CM | POA: Diagnosis not present

## 2016-01-02 DIAGNOSIS — K5732 Diverticulitis of large intestine without perforation or abscess without bleeding: Secondary | ICD-10-CM | POA: Diagnosis not present

## 2016-01-02 DIAGNOSIS — E119 Type 2 diabetes mellitus without complications: Secondary | ICD-10-CM | POA: Diagnosis not present

## 2016-01-03 DIAGNOSIS — N3010 Interstitial cystitis (chronic) without hematuria: Secondary | ICD-10-CM | POA: Diagnosis not present

## 2016-01-04 ENCOUNTER — Other Ambulatory Visit: Payer: Self-pay | Admitting: Internal Medicine

## 2016-01-04 DIAGNOSIS — Z803 Family history of malignant neoplasm of breast: Secondary | ICD-10-CM

## 2016-01-04 DIAGNOSIS — Z1231 Encounter for screening mammogram for malignant neoplasm of breast: Secondary | ICD-10-CM

## 2016-01-10 ENCOUNTER — Ambulatory Visit (INDEPENDENT_AMBULATORY_CARE_PROVIDER_SITE_OTHER): Payer: Medicare Other | Admitting: Neurology

## 2016-01-10 DIAGNOSIS — N3010 Interstitial cystitis (chronic) without hematuria: Secondary | ICD-10-CM | POA: Diagnosis not present

## 2016-01-10 DIAGNOSIS — G7000 Myasthenia gravis without (acute) exacerbation: Secondary | ICD-10-CM

## 2016-01-10 NOTE — Procedures (Signed)
[facility]  [address], [address]  [place], [place] [address] Tel: [phone] Fax:  [phone] Test Date:  01/10/2016  Patient: Erica Drake DOB: [birth year] Physician: Narda Amber, DO  Sex: Female Height: 5\' 4"  Ref Phys: Narda Amber, DO  ID#: [id number] Temp: 32.5C Technician: Jerilynn Mages. Dean   Patient Complaints: This is a 80 year old female referred for evaluation of left eye lid ptosis and double vision.   NCV & EMG Findings: Extensive electrodiagnostic testing of the left upper extremity and cranial nerves shows:  1. Left median and sural sensory responses within normal limits.  2. Left median and peroneal motor responses are within normal limits.  3. Repetitive nerve stimulation of the left facial, spinal accessory, and median nerves and recording at the nasalis, trapezius, and abductor pollicis brevis, respectively, is within normal limits and does not show decrement. 4. Needle electrode examination of the left upper extremity is within normal limits.  There is a global pattern of incomplete motor unit activation which may be due to poor effort, pain, and less likely central disorder of motor unit control.  Impression: This is a normal study.  In particular, there is no evidence of a neuromuscular junction disorder, generalized sensorimotor polyneuropathy, or cervical radiculopathy affecting the left side.   ___________________________ Narda Amber, DO    Nerve Conduction Studies Anti Sensory Summary Table   Site NR Peak (ms) Norm Peak (ms) P-T Amp (V) Norm P-T Amp  Left Median Anti Sensory (2nd Digit)  32.5C  Wrist    2.7 <3.8 11.8 >10  Left Sural Anti Sensory (Lat Mall)  32.5C  Calf    3.5 <4.6 11.5 >3   Motor Summary Table   Site NR Onset (ms) Norm Onset (ms) O-P Amp (mV) Norm O-P Amp Site1 Site2 Delta-0 (ms) Dist (cm) Vel (m/s) Norm Vel (m/s)  Left Median Motor (Abd Poll Brev)  32.5C  Wrist    2.7 <4.0 6.1 >5 Elbow Wrist 3.7 23.0 62 >50    Elbow    6.4  5.7         Left Peroneal Motor (Ext Dig Brev)  32.5C  Ankle    3.3 <6.0 2.6 >2.5 B Fib Ankle 7.1 33.0 46 >40  B Fib    10.4  1.9  Poplt B Fib 1.9 10.0 53 >40  Poplt    12.3  1.8         Left Peroneal TA Motor (Tib Ant)  32.5C  Fib Head    3.0 <4.5 3.8 >3 Poplit Fib Head 1.0 0.0  >40  Poplit    4.0  3.6          EMG   Side Muscle Ins Act Fibs Psw Fasc Number Recrt Dur Dur. Amp Amp. Poly Poly. Comment  Left 1stDorInt Nml Nml Nml Nml 1- Mod-V Nml Nml Nml Nml Nml Nml N/A  Left PronatorTeres Nml Nml Nml Nml 1- Mod-V Nml Nml Nml Nml Nml Nml N/A  Left Ext Indicis Nml Nml Nml Nml 1- Mod-V Nml Nml Nml Nml Nml Nml N/A  Left Biceps Nml Nml Nml Nml 1- Mod-V Nml Nml Nml Nml Nml Nml N/A  Left Triceps Nml Nml Nml Nml 1- Mod-V Nml Nml Nml Nml Nml Nml N/A  Left Deltoid Nml Nml Nml Nml 1- Mod-V Nml Nml Nml Nml Nml Nml N/A   RNS   Trial # Label Amp 1 (mV)  O-P Amp 5 (mV)  O-P Amp % Dif Area 1 (mVms) Area 5 (mVms)  Area % Dif Rep Rate Train Length Pause Time (min:sec) Comments  Left Abd Poll Brev  Tr 1 Baseline 6.29 6.11 -2.9 15.36 14.14 -7.9 3.00 10 00:30   Tr 2 Post Exercise 6.47 6.65 2.9 15.34 14.23 -7.2 3.00 10 01:00   Tr 3 1 Min Post 6.39 6.32 -1.0 15.86 14.45 -8.9 3.00 10 01:00   Tr 4 2 Min Post 6.50 6.42 -1.2 15.46 14.39 -6.9 3.00 10 01:00   Tr 5 3 Min Post 6.41 6.39 -0.3 15.33 14.22 -7.3 3.00 10 00:00   Left Trapezius  Tr 1 Baseline 3.69 3.84 4.3 28.54 27.44 -3.8 3.00 10 00:30   Tr 3 Post Exercise 4.21 4.11 -2.3 28.94 27.21 -6.0 3.00 10 01:00   Tr 4 1 Min Post 3.33 3.56 7.0 25.16 25.15 -0.0 3.00 10 01:00   Tr 5 2 Min Post 3.00 3.50 16.4 23.95 27.49 14.7 3.00 10 01:00   Tr 6 3 Min Post 4.13 4.26 3.1 29.87 29.90 0.1 3.00 10 00:00   Left Nasalis - Run #1  Tr 1 Baseline 0.82 0.79 -4.1 2.51 2.39 -4.7 3.00 10 00:30   Tr 2 Post Exercise 0.85 0.78 -8.9 2.05 1.90 -7.5 3.00 10 01:00   Tr 3 1 Min Post 0.89 0.89 -0.2 0.00 0.00 0.0 3.00 10 01:00   Tr 4 2 Min Post 0.84 0.77 -7.6 1.79  1.74 -2.7 3.00 10 01:00   Tr 5 3 Min Post 0.58 0.55 -6.1 1.15 1.23 7.3 3.00 10 00:00               Waveforms:

## 2016-01-13 ENCOUNTER — Telehealth: Payer: Self-pay | Admitting: Neurology

## 2016-01-13 NOTE — Telephone Encounter (Signed)
Called patient and discussed results of EMG with RNS which returned normal - no evidence of NMJ disorder.  Based on her clinical history and exam, I still feel that she has ocular myasthenia gravis.  She is currently on prednisone 20mg  and has been as high as prednisone 40mg , but has not noticed any changes in double vision which makes severely hampers her ability to drive.  Medication management has been difficult with her drug intolerances and other medical co-morbidities.  She is scheduled to seek a second opinion with Dr. Duke Salvia at [facility], who opinion is appreciated.  Erica Drake Pronto, DO

## 2016-01-16 DIAGNOSIS — Z961 Presence of intraocular lens: Secondary | ICD-10-CM | POA: Diagnosis not present

## 2016-01-16 DIAGNOSIS — H5213 Myopia, bilateral: Secondary | ICD-10-CM | POA: Diagnosis not present

## 2016-01-16 DIAGNOSIS — H532 Diplopia: Secondary | ICD-10-CM | POA: Diagnosis not present

## 2016-01-16 DIAGNOSIS — E119 Type 2 diabetes mellitus without complications: Secondary | ICD-10-CM | POA: Diagnosis not present

## 2016-01-17 DIAGNOSIS — N3010 Interstitial cystitis (chronic) without hematuria: Secondary | ICD-10-CM | POA: Diagnosis not present

## 2016-01-26 DIAGNOSIS — N3010 Interstitial cystitis (chronic) without hematuria: Secondary | ICD-10-CM | POA: Diagnosis not present

## 2016-01-27 DIAGNOSIS — M170 Bilateral primary osteoarthritis of knee: Secondary | ICD-10-CM | POA: Diagnosis not present

## 2016-01-27 DIAGNOSIS — M25552 Pain in left hip: Secondary | ICD-10-CM | POA: Diagnosis not present

## 2016-01-31 DIAGNOSIS — N3010 Interstitial cystitis (chronic) without hematuria: Secondary | ICD-10-CM | POA: Diagnosis not present

## 2016-02-06 ENCOUNTER — Ambulatory Visit
Admission: RE | Admit: 2016-02-06 | Discharge: 2016-02-06 | Disposition: A | Discharge status: Home / Self Care | Payer: Medicare Other | Source: Ambulatory Visit | Attending: Internal Medicine | Admitting: Internal Medicine

## 2016-02-06 DIAGNOSIS — Z1231 Encounter for screening mammogram for malignant neoplasm of breast: Secondary | ICD-10-CM | POA: Diagnosis not present

## 2016-02-06 DIAGNOSIS — Z803 Family history of malignant neoplasm of breast: Secondary | ICD-10-CM

## 2016-02-06 DIAGNOSIS — F341 Dysthymic disorder: Secondary | ICD-10-CM | POA: Diagnosis not present

## 2016-02-07 DIAGNOSIS — N3010 Interstitial cystitis (chronic) without hematuria: Secondary | ICD-10-CM | POA: Diagnosis not present

## 2016-02-09 ENCOUNTER — Ambulatory Visit: Payer: Self-pay | Admitting: Orthopedic Surgery

## 2016-02-09 ENCOUNTER — Other Ambulatory Visit: Payer: Self-pay | Admitting: Internal Medicine

## 2016-02-09 DIAGNOSIS — N3010 Interstitial cystitis (chronic) without hematuria: Secondary | ICD-10-CM | POA: Diagnosis not present

## 2016-02-09 DIAGNOSIS — R928 Other abnormal and inconclusive findings on diagnostic imaging of breast: Secondary | ICD-10-CM

## 2016-02-09 DIAGNOSIS — R300 Dysuria: Secondary | ICD-10-CM | POA: Diagnosis not present

## 2016-02-10 DIAGNOSIS — N3020 Other chronic cystitis without hematuria: Secondary | ICD-10-CM | POA: Diagnosis not present

## 2016-02-10 DIAGNOSIS — N3010 Interstitial cystitis (chronic) without hematuria: Secondary | ICD-10-CM | POA: Diagnosis not present

## 2016-02-15 DIAGNOSIS — Z7952 Long term (current) use of systemic steroids: Secondary | ICD-10-CM | POA: Diagnosis not present

## 2016-02-15 DIAGNOSIS — H02402 Unspecified ptosis of left eyelid: Secondary | ICD-10-CM | POA: Diagnosis not present

## 2016-02-15 DIAGNOSIS — I129 Hypertensive chronic kidney disease with stage 1 through stage 4 chronic kidney disease, or unspecified chronic kidney disease: Secondary | ICD-10-CM | POA: Diagnosis not present

## 2016-02-15 DIAGNOSIS — Z882 Allergy status to sulfonamides status: Secondary | ICD-10-CM | POA: Diagnosis not present

## 2016-02-15 DIAGNOSIS — Z881 Allergy status to other antibiotic agents status: Secondary | ICD-10-CM | POA: Diagnosis not present

## 2016-02-15 DIAGNOSIS — J45909 Unspecified asthma, uncomplicated: Secondary | ICD-10-CM | POA: Diagnosis not present

## 2016-02-15 DIAGNOSIS — N189 Chronic kidney disease, unspecified: Secondary | ICD-10-CM | POA: Diagnosis not present

## 2016-02-15 DIAGNOSIS — Z885 Allergy status to narcotic agent status: Secondary | ICD-10-CM | POA: Diagnosis not present

## 2016-02-15 DIAGNOSIS — J410 Simple chronic bronchitis: Secondary | ICD-10-CM | POA: Diagnosis not present

## 2016-02-15 DIAGNOSIS — N3010 Interstitial cystitis (chronic) without hematuria: Secondary | ICD-10-CM | POA: Diagnosis not present

## 2016-02-15 DIAGNOSIS — Z79899 Other long term (current) drug therapy: Secondary | ICD-10-CM | POA: Diagnosis not present

## 2016-02-15 DIAGNOSIS — G7000 Myasthenia gravis without (acute) exacerbation: Secondary | ICD-10-CM | POA: Diagnosis not present

## 2016-02-15 DIAGNOSIS — K589 Irritable bowel syndrome without diarrhea: Secondary | ICD-10-CM | POA: Diagnosis not present

## 2016-02-15 DIAGNOSIS — M797 Fibromyalgia: Secondary | ICD-10-CM | POA: Diagnosis not present

## 2016-02-15 DIAGNOSIS — Z888 Allergy status to other drugs, medicaments and biological substances status: Secondary | ICD-10-CM | POA: Diagnosis not present

## 2016-02-15 DIAGNOSIS — H532 Diplopia: Secondary | ICD-10-CM | POA: Diagnosis not present

## 2016-02-16 ENCOUNTER — Ambulatory Visit: Payer: Medicare Other | Admitting: Neurology

## 2016-02-17 ENCOUNTER — Ambulatory Visit
Admission: RE | Admit: 2016-02-17 | Discharge: 2016-02-17 | Disposition: A | Discharge status: Home / Self Care | Payer: Medicare Other | Source: Ambulatory Visit | Attending: Internal Medicine | Admitting: Internal Medicine

## 2016-02-17 DIAGNOSIS — R928 Other abnormal and inconclusive findings on diagnostic imaging of breast: Secondary | ICD-10-CM | POA: Diagnosis not present

## 2016-02-17 DIAGNOSIS — F341 Dysthymic disorder: Secondary | ICD-10-CM | POA: Diagnosis not present

## 2016-02-17 DIAGNOSIS — N6489 Other specified disorders of breast: Secondary | ICD-10-CM | POA: Diagnosis not present

## 2016-02-23 DIAGNOSIS — M170 Bilateral primary osteoarthritis of knee: Secondary | ICD-10-CM | POA: Diagnosis not present

## 2016-02-23 DIAGNOSIS — M1712 Unilateral primary osteoarthritis, left knee: Secondary | ICD-10-CM | POA: Diagnosis not present

## 2016-02-24 DIAGNOSIS — N3010 Interstitial cystitis (chronic) without hematuria: Secondary | ICD-10-CM | POA: Diagnosis not present

## 2016-02-28 DIAGNOSIS — E119 Type 2 diabetes mellitus without complications: Secondary | ICD-10-CM | POA: Diagnosis not present

## 2016-02-28 DIAGNOSIS — N183 Chronic kidney disease, stage 3 (moderate): Secondary | ICD-10-CM | POA: Diagnosis not present

## 2016-02-28 DIAGNOSIS — J4531 Mild persistent asthma with (acute) exacerbation: Secondary | ICD-10-CM | POA: Diagnosis not present

## 2016-02-28 DIAGNOSIS — I129 Hypertensive chronic kidney disease with stage 1 through stage 4 chronic kidney disease, or unspecified chronic kidney disease: Secondary | ICD-10-CM | POA: Diagnosis not present

## 2016-02-28 DIAGNOSIS — G7000 Myasthenia gravis without (acute) exacerbation: Secondary | ICD-10-CM | POA: Diagnosis not present

## 2016-03-01 DIAGNOSIS — N3010 Interstitial cystitis (chronic) without hematuria: Secondary | ICD-10-CM | POA: Diagnosis not present

## 2016-03-02 DIAGNOSIS — M23322 Other meniscus derangements, posterior horn of medial meniscus, left knee: Secondary | ICD-10-CM | POA: Diagnosis not present

## 2016-03-02 DIAGNOSIS — M170 Bilateral primary osteoarthritis of knee: Secondary | ICD-10-CM | POA: Diagnosis not present

## 2016-03-05 ENCOUNTER — Telehealth: Payer: Self-pay | Admitting: Neurology

## 2016-03-05 ENCOUNTER — Telehealth: Payer: Self-pay | Admitting: Internal Medicine

## 2016-03-05 DIAGNOSIS — J4531 Mild persistent asthma with (acute) exacerbation: Secondary | ICD-10-CM

## 2016-03-05 NOTE — Telephone Encounter (Signed)
Attempted to call pt. Received a fast busy signal x2. Will try back. 

## 2016-03-05 NOTE — Telephone Encounter (Signed)
Called patient back and no answer and no voicemail.  Will try again.

## 2016-03-05 NOTE — Telephone Encounter (Signed)
Patient wants to talk to someone about can take over the counter for a cold with her medication and other medical conditions that are going on [phone]

## 2016-03-06 ENCOUNTER — Encounter: Payer: Self-pay | Admitting: *Deleted

## 2016-03-06 MED ORDER — ALBUTEROL SULFATE (2.5 MG/3ML) 0.083% IN NEBU
2.5000 mg | INHALATION_SOLUTION | Freq: Four times a day (QID) | RESPIRATORY_TRACT | 11 refills | Status: DC | PRN
Start: 2016-03-06 — End: 2017-04-05

## 2016-03-06 NOTE — Telephone Encounter (Signed)
Ok to order DME  Nebulizer compressor for aerosol meds, with mouth piece, tubing, supplies                                Albuterol neb solution 25 ampules,   1 every 6 hours if needed, refill x 1 year

## 2016-03-06 NOTE — Telephone Encounter (Signed)
Message sent to patient via My Chart informing her that we would not have ordered the nebulizer.  Instructed her to call PCP or Pulmonologist.

## 2016-03-06 NOTE — Telephone Encounter (Signed)
Spoke with pt who feels that she currently has bronchitis. Pt is requesting a new neb machine & medication.  Pt c/o prod cough with yellow mucus, wheezing, fever (unsure how high temp has been), chills & sweats X4d Pt has contacted her PCP about the following, but was not prescribed an abx.   CY please advise. Thanks.   Current Outpatient Prescriptions on File Prior to Visit  Medication Sig Dispense Refill  . albuterol (PROAIR HFA) 108 (90 Base) MCG/ACT inhaler Inhale into the lungs.    . bupivacaine (MARCAINE) 0.25 % injection by Infiltration route once. For the bladder irrigation.    Marland Kitchen buPROPion (WELLBUTRIN SR) 200 MG 12 hr tablet Take 200 mg by mouth 2 (two) times daily.     . clonazePAM (KLONOPIN) 2 MG tablet Take 2 mg by mouth.    . DEXILANT 60 MG capsule Take 60 mg by mouth daily.  5  . ESTRACE VAGINAL 0.1 MG/GM vaginal cream   10  . famotidine (TH FAMOTIDINE 10) 10 MG tablet Take by mouth.    . Glycopyrrolate-Formoterol (BEVESPI AEROSPHERE) 9-4.8 MCG/ACT AERO Inhale 2 puffs into the lungs 2 (two) times daily. 1 Inhaler 0  . Lactobacillus (LACTINEX PO) Take by mouth. Takes 2 a day    . Lidocaine, Anorectal, 5 % GEL     . magnesium hydroxide (MILK OF MAGNESIA) 400 MG/5ML suspension Take 30 mLs by mouth.    . Meclizine HCl 25 MG CHEW Chew 25 mg by mouth.    . mometasone (NASONEX) 50 MCG/ACT nasal spray Place into the nose.    . predniSONE (DELTASONE) 20 MG tablet Take 1 tablet (20 mg total) by mouth daily. 60 tablet 5  . pyridostigmine (MESTINON) 60 MG tablet Take 1 tablet (60 mg total) by mouth 3 (three) times daily. 180 tablet 3  . traMADol (ULTRAM) 50 MG tablet Take 50 mg by mouth every 6 (six) hours as needed.  0  . triamcinolone (NASACORT) 55 MCG/ACT AERO nasal inhaler Place into the nose.     No current facility-administered medications on file prior to visit.     Allergies  Allergen Reactions  . Cortisone Shortness Of Breath, Swelling and Other (See Comments)    Tongue  swelling Tongue swelling  . Amlodipine Besylate Other (See Comments)    cough  . Chlorhexidine Gluconate Itching    Skin on face turned red Skin on face turned red  . Clindamycin Itching  . Codeine Other (See Comments)  . Dexamethasone Other (See Comments)  . Duloxetine Other (See Comments)  . Morphine Sulfate Other (See Comments)  . Niacin Other (See Comments)  . Other Other (See Comments)    Pollen  . Prednisone Other (See Comments)    Causes stomach pain. Sores in her mouth. Causes stomach pain. Sores in her mouth.  . Rosuvastatin Calcium Other (See Comments)    Muscle aches  . Statins Other (See Comments)    Muscle aches  . Strawberry Extract Other (See Comments)  . Sulfa Antibiotics Other (See Comments)  . Valsartan Other (See Comments)

## 2016-03-06 NOTE — Telephone Encounter (Signed)
Spoke with pt, aware of recs.  Orders placed.  Nothing further needed.  

## 2016-03-07 ENCOUNTER — Ambulatory Visit: Payer: Medicare Other | Admitting: Neurology

## 2016-03-07 DIAGNOSIS — J42 Unspecified chronic bronchitis: Secondary | ICD-10-CM | POA: Diagnosis not present

## 2016-03-08 ENCOUNTER — Encounter (HOSPITAL_COMMUNITY): Admission: RE | Admit: 2016-03-08 | Payer: Medicare Other | Source: Ambulatory Visit

## 2016-03-08 DIAGNOSIS — N281 Cyst of kidney, acquired: Secondary | ICD-10-CM | POA: Diagnosis not present

## 2016-03-09 ENCOUNTER — Telehealth: Payer: Self-pay | Admitting: Internal Medicine

## 2016-03-09 NOTE — Telephone Encounter (Signed)
Spoke with pt. She is having a flare up of chronic bronchitis. Pt saw her PCP and was given medication but was instructed to call to make an appointment with [place]. Pt's appointment has been moved up to 03/13/16 at 3:15pm with TP. Advised her that if she needed [place] before this date and time to please call us back. Nothing further was needed.

## 2016-03-12 DIAGNOSIS — N281 Cyst of kidney, acquired: Secondary | ICD-10-CM | POA: Insufficient documentation

## 2016-03-13 ENCOUNTER — Ambulatory Visit: Payer: Medicare Other | Admitting: Adult Health

## 2016-03-13 DIAGNOSIS — N3010 Interstitial cystitis (chronic) without hematuria: Secondary | ICD-10-CM | POA: Diagnosis not present

## 2016-03-13 DIAGNOSIS — J45901 Unspecified asthma with (acute) exacerbation: Secondary | ICD-10-CM | POA: Diagnosis not present

## 2016-03-14 ENCOUNTER — Encounter (HOSPITAL_COMMUNITY): Admission: RE | Payer: Self-pay | Source: Ambulatory Visit

## 2016-03-14 ENCOUNTER — Ambulatory Visit (HOSPITAL_COMMUNITY): Admission: RE | Admit: 2016-03-14 | Payer: Medicare Other | Source: Ambulatory Visit | Admitting: Orthopedic Surgery

## 2016-03-14 SURGERY — ARTHROSCOPY, KNEE
Anesthesia: Choice | Laterality: Left

## 2016-03-16 ENCOUNTER — Encounter: Payer: Self-pay | Admitting: Internal Medicine

## 2016-03-16 ENCOUNTER — Ambulatory Visit (INDEPENDENT_AMBULATORY_CARE_PROVIDER_SITE_OTHER): Payer: Medicare Other | Admitting: Internal Medicine

## 2016-03-16 ENCOUNTER — Ambulatory Visit (INDEPENDENT_AMBULATORY_CARE_PROVIDER_SITE_OTHER)
Admission: RE | Admit: 2016-03-16 | Discharge: 2016-03-16 | Disposition: A | Discharge status: Home / Self Care | Payer: Medicare Other | Source: Ambulatory Visit | Attending: Internal Medicine | Admitting: Internal Medicine

## 2016-03-16 VITALS — BP 120/76 | HR 94 | Ht 64.0 in | Wt 152.2 lb

## 2016-03-16 DIAGNOSIS — J42 Unspecified chronic bronchitis: Secondary | ICD-10-CM

## 2016-03-16 DIAGNOSIS — R8290 Unspecified abnormal findings in urine: Secondary | ICD-10-CM | POA: Diagnosis not present

## 2016-03-16 DIAGNOSIS — J449 Chronic obstructive pulmonary disease, unspecified: Secondary | ICD-10-CM

## 2016-03-16 DIAGNOSIS — G7001 Myasthenia gravis with (acute) exacerbation: Secondary | ICD-10-CM | POA: Diagnosis not present

## 2016-03-16 DIAGNOSIS — R05 Cough: Secondary | ICD-10-CM | POA: Diagnosis not present

## 2016-03-16 MED ORDER — UMECLIDINIUM-VILANTEROL 62.5-25 MCG/INH IN AEPB: 1.0000 | INHALATION_SPRAY | Freq: Every day | RESPIRATORY_TRACT | 0 refills | Status: DC

## 2016-03-16 MED ORDER — UMECLIDINIUM-VILANTEROL 62.5-25 MCG/INH IN AEPB: INHALATION_SPRAY | RESPIRATORY_TRACT | 12 refills | Status: DC

## 2016-03-16 NOTE — Patient Instructions (Addendum)
Sample and script Anoro Ellipta inhaler    Inhale 1 puff, once daily      This replaces Bevespi- preferred by insurance  Order- CXR   Dx chronic bronchitis  Order- office spirometry      Ok to use your nebulizer as often as every 6 hours if needed

## 2016-03-16 NOTE — Progress Notes (Signed)
HPI female former smoker followed for Asthma, Allergic rhinitis, chronic bronchitis, eustachian tube dysfunction, complicated by GERD, impaired hearing, fibromyalgia, insomnia, Myasthenia Gravis    ---------------------------------------------------------------------------------------  12/29/2015-81 year old female former smoker followed for Asthma, Allergic rhinitis, chronic bronchitis, eustachian tube dysfunction, complicated by GERD/ Nissen, impaired hearing, fibromyalgia, insomnia, Myasthenia Gravis FOLLOWS  FOR: Pt. c/o wheezing alot,coughing unable to produce anything,  she wanted to know if she can get allergy shots again Now diagnosed with Myasthenia Gravis and on maintenance prednisone 20 mg daily for the past month.. Has Proventil inhaler but no longer has Spiriva. Has nebulizer solution but her machine is boxed up waiting for moved to [place]. CXR 12/01/2015 IMPRESSION: There is no edema or consolidation. Heart size and pulmonary vascularity are normal. No adenopathy. There is degenerative change in the thoracic spine.  03/16/2016- 81 year old female former smoker followed for Asthma, Allergic rhinitis, chronic bronchitis, eustachian tube dysfunction, complicated by GERD/ Nissen, impaired hearing, fibromyalgia, insomnia, Myasthenia Gravis FOLLOWS FOR: Coughing,yellow-browish,better but still chough up. something. wheezing,SOB,nasal drainage.Neur. Dr. wants pred. pt. Resisting. Patient actually says she feels the best that she has in quite a while. Has been dealing with bronchitis since October. Sounds like 2 or 3 separate infections rub than 1 constant illness. She is hoping to stay off of prednisone because of side effects, although her neurologist wants her on it for her myasthenia. She wheezes some most days. Uses nebulizer and rescue inhaler as needed. Averages 2 or 3 times per week with a rescue inhaler. Reflux symptoms well controlled with past history of surgical  repair. Office Spirometry 03/16/2016-severe obstructive airways disease FVC 1.59/59%, FEV1 0.98/49%, ratio 0.62, FEF 25-75% 0.42/28%   ROS-see HPI   Negative unless "+" Constitutional:    weight loss, night sweats, fevers, chills, fatigue, lassitude. HEENT:    headaches, difficulty swallowing, +tooth/dental problems, sore throat,       sneezing, itching, ear ache, nasal congestion, post nasal drip, snoring CV:    chest pain, orthopnea, PND, swelling in lower extremities, anasarca,                                                        dizziness, +palpitations Resp:   shortness of breath with exertion or at rest.               + productive cough,   non-productive cough, coughing up of blood.              change in color of mucus.  + wheezing.   Skin:    rash or lesions. GI: +heartburn, +indigestion, abdominal pain, nausea, vomiting, diarrhea,                 change in bowel habits, loss of appetite GU: dysuria, change in color of urine, no urgency or frequency.   flank pain. MS:   +joint pain, stiffness, decreased range of motion, back pain. Neuro-     +diplopia Psych:  change in mood or affect.  depression or +anxiety.   memory loss.  OBJ- Physical Exam General- Alert, Oriented, Affect-appropriate, Distress- none acute Skin- rash-none, lesions- none, excoriation- none Lymphadenopathy- none Head- atraumatic            Eyes- + patch over left eye diplopia            Ears- +HOH  Nose- Clear, no-Septal dev, mucus, polyps, erosion, perforation             Throat- Mallampati II , mucosa clear , drainage- none, tonsils- atrophic Neck- flexible , trachea midline, no stridor , thyroid nl, carotid no bruit Chest - symmetrical excursion , unlabored           Heart/CV- RRR , no murmur , no gallop  , no rub, nl s1 s2                           - JVD- none , edema- none, stasis changes- none, varices- none           Lung-  wheeze + expiration , dullness-none, rub- none           Chest  wall-  Abd-  Br/ Gen/ Rectal- Not done, not indicated Extrem- cyanosis- none, clubbing, none, atrophy- none, strength- nl Neuro- grossly intact to observation

## 2016-03-18 NOTE — Assessment & Plan Note (Signed)
We discussed potentially significant limitations of medicine choices in the future for those classes contraindicated in patients with myasthenia

## 2016-03-18 NOTE — Assessment & Plan Note (Signed)
She has had a sustained bronchitis pattern through much of the fall and winter so far but it is not yet clear if this is fixed obstructive airways disease. Plan-sample Anoro Ellipta inhaler for trial, CXR, office spirometry done. Anticipate we will need formal PFT

## 2016-03-20 DIAGNOSIS — N3010 Interstitial cystitis (chronic) without hematuria: Secondary | ICD-10-CM | POA: Diagnosis not present

## 2016-03-27 DIAGNOSIS — N3010 Interstitial cystitis (chronic) without hematuria: Secondary | ICD-10-CM | POA: Diagnosis not present

## 2016-03-29 DIAGNOSIS — F341 Dysthymic disorder: Secondary | ICD-10-CM | POA: Diagnosis not present

## 2016-03-29 DIAGNOSIS — D485 Neoplasm of uncertain behavior of skin: Secondary | ICD-10-CM | POA: Diagnosis not present

## 2016-03-29 DIAGNOSIS — C44729 Squamous cell carcinoma of skin of left lower limb, including hip: Secondary | ICD-10-CM | POA: Diagnosis not present

## 2016-04-02 ENCOUNTER — Telehealth: Payer: Self-pay | Admitting: Internal Medicine

## 2016-04-02 DIAGNOSIS — K580 Irritable bowel syndrome with diarrhea: Secondary | ICD-10-CM | POA: Diagnosis not present

## 2016-04-02 DIAGNOSIS — N3011 Interstitial cystitis (chronic) with hematuria: Secondary | ICD-10-CM | POA: Diagnosis not present

## 2016-04-02 DIAGNOSIS — G7000 Myasthenia gravis without (acute) exacerbation: Secondary | ICD-10-CM | POA: Diagnosis not present

## 2016-04-02 DIAGNOSIS — R1032 Left lower quadrant pain: Secondary | ICD-10-CM | POA: Diagnosis not present

## 2016-04-02 DIAGNOSIS — K219 Gastro-esophageal reflux disease without esophagitis: Secondary | ICD-10-CM | POA: Diagnosis not present

## 2016-04-02 DIAGNOSIS — M797 Fibromyalgia: Secondary | ICD-10-CM | POA: Diagnosis not present

## 2016-04-02 DIAGNOSIS — E119 Type 2 diabetes mellitus without complications: Secondary | ICD-10-CM | POA: Diagnosis not present

## 2016-04-02 MED ORDER — UMECLIDINIUM-VILANTEROL 62.5-25 MCG/INH IN AEPB: 1.0000 | INHALATION_SPRAY | Freq: Every day | RESPIRATORY_TRACT | 5 refills | Status: DC

## 2016-04-02 NOTE — Telephone Encounter (Signed)
Spoke with pt, requesting refill on anoro.  This has been sent to preferred pharmacy. Pt also asking if she is due for pna shot- I advised that we do not have any immunizations documented for her aside from flu shots, and suggested pt contact PCP for this info.  Pt expressed understanding.  Nothing further needed at this time.

## 2016-04-03 ENCOUNTER — Telehealth: Payer: Self-pay | Admitting: Internal Medicine

## 2016-04-03 DIAGNOSIS — N3010 Interstitial cystitis (chronic) without hematuria: Secondary | ICD-10-CM | POA: Diagnosis not present

## 2016-04-03 NOTE — Telephone Encounter (Signed)
Attempted to call pt. Line rang busy x3. Will try back. 

## 2016-04-03 NOTE — Telephone Encounter (Signed)
Spoke with pt. She is requesting to have her PNA vaccine.  CY - please advise if this is okay and what type of PNA vaccine she should have. Thanks.

## 2016-04-03 NOTE — Telephone Encounter (Signed)
Advise Prevnar 13 pneumonia vaccine

## 2016-04-03 NOTE — Telephone Encounter (Signed)
Spoke with pt. She has been scheduled on 04/04/16 at 3pm for her PNA vaccine. Nothing further was needed.

## 2016-04-04 ENCOUNTER — Other Ambulatory Visit: Payer: Self-pay | Admitting: Gastroenterology

## 2016-04-04 ENCOUNTER — Ambulatory Visit: Payer: Medicare Other

## 2016-04-04 DIAGNOSIS — E119 Type 2 diabetes mellitus without complications: Secondary | ICD-10-CM | POA: Diagnosis not present

## 2016-04-04 DIAGNOSIS — R1032 Left lower quadrant pain: Secondary | ICD-10-CM

## 2016-04-04 DIAGNOSIS — G7000 Myasthenia gravis without (acute) exacerbation: Secondary | ICD-10-CM | POA: Diagnosis not present

## 2016-04-04 DIAGNOSIS — M797 Fibromyalgia: Secondary | ICD-10-CM | POA: Diagnosis not present

## 2016-04-04 DIAGNOSIS — N3011 Interstitial cystitis (chronic) with hematuria: Secondary | ICD-10-CM | POA: Diagnosis not present

## 2016-04-04 DIAGNOSIS — K580 Irritable bowel syndrome with diarrhea: Secondary | ICD-10-CM | POA: Diagnosis not present

## 2016-04-04 DIAGNOSIS — K5733 Diverticulitis of large intestine without perforation or abscess with bleeding: Secondary | ICD-10-CM | POA: Diagnosis not present

## 2016-04-04 DIAGNOSIS — N183 Chronic kidney disease, stage 3 (moderate): Secondary | ICD-10-CM | POA: Diagnosis not present

## 2016-04-04 DIAGNOSIS — K648 Other hemorrhoids: Secondary | ICD-10-CM | POA: Diagnosis not present

## 2016-04-04 DIAGNOSIS — K219 Gastro-esophageal reflux disease without esophagitis: Secondary | ICD-10-CM | POA: Diagnosis not present

## 2016-04-04 NOTE — Telephone Encounter (Signed)
LMTCB

## 2016-04-05 DIAGNOSIS — N3010 Interstitial cystitis (chronic) without hematuria: Secondary | ICD-10-CM | POA: Diagnosis not present

## 2016-04-05 NOTE — Telephone Encounter (Signed)
Called and spoke with pt and she stated that she spoke with Tammy yesterday and this has been resolved. Nothing further is needed.

## 2016-04-09 ENCOUNTER — Inpatient Hospital Stay
Admission: RE | Admit: 2016-04-09 | Discharge: 2016-04-09 | Disposition: A | Discharge status: Home / Self Care | Payer: Medicare Other | Source: Ambulatory Visit | Attending: Gastroenterology | Admitting: Gastroenterology

## 2016-04-10 DIAGNOSIS — N3010 Interstitial cystitis (chronic) without hematuria: Secondary | ICD-10-CM | POA: Diagnosis not present

## 2016-04-16 ENCOUNTER — Other Ambulatory Visit: Payer: Self-pay | Admitting: Internal Medicine

## 2016-04-16 ENCOUNTER — Ambulatory Visit
Admission: RE | Admit: 2016-04-16 | Discharge: 2016-04-16 | Disposition: A | Discharge status: Home / Self Care | Payer: Medicare Other | Source: Ambulatory Visit | Attending: Gastroenterology | Admitting: Gastroenterology

## 2016-04-16 ENCOUNTER — Ambulatory Visit (INDEPENDENT_AMBULATORY_CARE_PROVIDER_SITE_OTHER): Payer: Medicare Other | Admitting: Internal Medicine

## 2016-04-16 DIAGNOSIS — J449 Chronic obstructive pulmonary disease, unspecified: Secondary | ICD-10-CM | POA: Diagnosis not present

## 2016-04-16 DIAGNOSIS — K5730 Diverticulosis of large intestine without perforation or abscess without bleeding: Secondary | ICD-10-CM | POA: Diagnosis not present

## 2016-04-16 DIAGNOSIS — R1032 Left lower quadrant pain: Secondary | ICD-10-CM

## 2016-04-16 LAB — PULMONARY FUNCTION TEST
DL/VA % PRED: 67 %
DL/VA: 3.32 ml/min/mmHg/L
DLCO COR % PRED: 43 %
DLCO COR: 11.02 ml/min/mmHg
DLCO unc % pred: 45 %
DLCO unc: 11.53 ml/min/mmHg
FEF 25-75 POST: 0.71 L/s
FEF 25-75 PRE: 0.48 L/s
FEF2575-%CHANGE-POST: 47 %
FEF2575-%PRED-POST: 45 %
FEF2575-%PRED-PRE: 30 %
FEV1-%Change-Post: 17 %
FEV1-%Pred-Post: 55 %
FEV1-%Pred-Pre: 47 %
FEV1-Post: 1.17 L
FEV1-Pre: 0.99 L
FEV1FVC-%CHANGE-POST: 10 %
FEV1FVC-%PRED-PRE: 78 %
FEV6-%CHANGE-POST: 9 %
FEV6-%Pred-Post: 68 %
FEV6-%Pred-Pre: 62 %
FEV6-PRE: 1.67 L
FEV6-Post: 1.83 L
FEV6FVC-%Change-Post: 1 %
FEV6FVC-%PRED-PRE: 102 %
FEV6FVC-%Pred-Post: 104 %
FVC-%Change-Post: 7 %
FVC-%Pred-Post: 65 %
FVC-%Pred-Pre: 61 %
FVC-Post: 1.83 L
FVC-Pre: 1.71 L
POST FEV1/FVC RATIO: 64 %
PRE FEV1/FVC RATIO: 58 %
Post FEV6/FVC ratio: 100 %
Pre FEV6/FVC Ratio: 98 %
RV % pred: 116 %
RV: 2.81 L
TLC % pred: 89 %
TLC: 4.64 L

## 2016-04-16 MED ORDER — IOPAMIDOL (ISOVUE-300) INJECTION 61%
75.0000 mL | Freq: Once | INTRAVENOUS | Status: AC | PRN
  Administered 2016-04-16: 75 mL via INTRAVENOUS

## 2016-04-17 DIAGNOSIS — N3010 Interstitial cystitis (chronic) without hematuria: Secondary | ICD-10-CM | POA: Diagnosis not present

## 2016-04-18 ENCOUNTER — Ambulatory Visit (INDEPENDENT_AMBULATORY_CARE_PROVIDER_SITE_OTHER): Payer: Medicare Other | Admitting: Neurology

## 2016-04-18 ENCOUNTER — Encounter: Payer: Self-pay | Admitting: Neurology

## 2016-04-18 VITALS — BP 120/80 | HR 108 | Ht 64.0 in | Wt 146.4 lb

## 2016-04-18 DIAGNOSIS — G7000 Myasthenia gravis without (acute) exacerbation: Secondary | ICD-10-CM

## 2016-04-18 NOTE — Patient Instructions (Addendum)
1.  Continues mestinon 60mg  three times daily.  OK to add extra dose as needed 2.  Return to clinic in 3 months

## 2016-04-18 NOTE — Progress Notes (Signed)
Follow-up Visit   Date: 04/18/16    Erica Drake MRN: [id number] DOB: 01-Nov-1935   Interim History: Erica Drake is a 81 y.o. right-handed Caucasian female with fibromyalgia, GERD, depression, left sensorineural deficit, and interstitial cystitis, type 2 diabetes mellitus returning to the clinic for follow-up of seropositive myasthenia gravis.  The patient was accompanied to the clinic by self.  History of present illness: Initial visit 09/29/2015:  In May 2017, she began experiencing double vision which are located vertical and diagonal to each other.  She has noticed mornings are much better, but as the day progresses, her double vision gets worsen.  She endorses mild changes to her speech.  No difficulty with swallowing or shortness of breath.  She also complains of blurry vision in her right eye. The double vision has interfered with her driving, so she stopped this 2 months ago. She has seen her ENT, PCP, ophthalmologist, and then saw Dr. Jaynee Eagles in June who ordered MRI/A head and orbit which was normal.  Her acetylcholine antibodies returned positive.  A prescription for mestinon 94m three times daily was ordered, but patient did not start it.  She has many drug intolerances/allergies, including cortisone, so medications were started at a low dose.   She had many other chronic medical problems including chronic pain related to fibromyalgia, arthritis, and interstitial cystitis.    UPDATE 11/02/2015:  There has been no change in her double vision, despite increasing mestinon to 613mTID and adding prednisone 1029maily.  She is tolerating both medications well.  Because of difficulty with depth perception, she has unfortunately suffered several falls.  She is still not driving.  She admits to being very stressed lately because she and her husband are moving into a retirement community in October.  UPDATE 12/28/2015:  She stopped taking her prednisone because she has a number of  other problems including diverticulosis and bronchitis, which she thought would be worsened by prednisone.. DMarland Kitchenspite stopping her prednisone, there has been no change in her double vision.  Today, she complains of left eye pain and blurry vision. She continues to have double vision which is improved with using an eye patch.  No other extra-ocular MG complaints.   UPDATE 04/18/2016:  [facility] and was recommended to have SFEMG, but she did not wish to return for testing.  Over the past two months, she has noticed that double vision is improved and now only occurs intermittently throughout the day which she attributes to mestinon 67m30mD.  She is not taking prednisone.  She no longer uses an eye patch.  She denies double vision in the morning or difficulty swallowing.  She is living in [place] her husband, whose health is doing well.  Medications:  Current Outpatient Prescriptions on File Prior to Visit  Medication Sig Dispense Refill  . albuterol (PROAIR HFA) 108 (90 Base) MCG/ACT inhaler Inhale into the lungs.    . alMarland Kitchenuterol (PROVENTIL) (2.5 MG/3ML) 0.083% nebulizer solution Take 3 mLs (2.5 mg total) by nebulization every 6 (six) hours as needed for wheezing or shortness of breath. 25 vial 11  . bupivacaine (MARCAINE) 0.25 % injection by Infiltration route once. For the bladder irrigation.    . buMarland KitchenROPion (WELLBUTRIN SR) 200 MG 12 hr tablet Take 200 mg by mouth 2 (two) times daily.     . clonazePAM (KLONOPIN) 1 MG tablet Take 1 tablet by mouth 2 (two) times daily.  4  . DEXILANT 60 MG capsule Take 60 mg by mouth daily.  5  . magic mouthwash SOLN Take 15 mLs by mouth.    . Meclizine HCl 25 MG CHEW Chew 25 mg by mouth.    Marland Kitchen omeprazole (PRILOSEC OTC) 20 MG tablet Take 20 mg by mouth 2 (two) times daily.    Marland Kitchen pyridostigmine (MESTINON) 60 MG tablet Take 1 tablet (60 mg total) by mouth 3 (three) times daily. (Patient taking differently: Take 60 mg by mouth  4 (four) times daily. ) 180 tablet 3  . sucralfate (CARAFATE) 1 g tablet Take 1 g by mouth. AFTER EACH MEAL AND EVERY qhs    . traMADol (ULTRAM) 50 MG tablet Take 50 mg by mouth every 6 (six) hours as needed.  0  . triamcinolone (NASACORT) 55 MCG/ACT AERO nasal inhaler Place into the nose.    . umeclidinium-vilanterol (ANORO ELLIPTA) 62.5-25 MCG/INH AEPB Inhale 1 puff into the lungs daily. 60 each 5   No current facility-administered medications on file prior to visit.     Allergies:  Allergies  Allergen Reactions  . Cortisone Shortness Of Breath, Swelling and Other (See Comments)    Tongue swelling Tongue swelling  . Amlodipine Besylate Other (See Comments)    cough  . Chlorhexidine Gluconate Itching    Skin on face turned red Skin on face turned red  . Clindamycin Itching  . Codeine Other (See Comments)  . Dexamethasone Other (See Comments)  . Duloxetine Other (See Comments)  . Morphine Sulfate Other (See Comments)  . Niacin Other (See Comments)  . Other Other (See Comments)    Pollen  . Prednisone Other (See Comments)    Causes stomach pain. Sores in her mouth. Causes stomach pain. Sores in her mouth.  . Rosuvastatin Calcium Other (See Comments)    Muscle aches  . Statins Other (See Comments)    Muscle aches  . Strawberry Extract Other (See Comments)  . Sulfa Antibiotics Other (See Comments)  . Valsartan Other (See Comments)    Review of Systems:  CONSTITUTIONAL: No fevers, chills, night sweats, or weight loss.  EYES: No visual changes or eye pain ENT: No hearing changes.  No history of nose bleeds.   RESPIRATORY: No cough, wheezing and shortness of breath.   CARDIOVASCULAR: Negative for chest pain, and palpitations.   GI: Negative for abdominal discomfort, blood in stools or black stools.  No recent change in bowel habits.   GU:  No history of incontinence.   MUSCLOSKELETAL: No history of joint pain or swelling.  No myalgias.   SKIN: Negative for lesions, rash,  and itching.   ENDOCRINE: Negative for cold or heat intolerance, polydipsia or goiter.   PSYCH:  + depression or anxiety symptoms.   NEURO: As Above.   Vital Signs:  BP 120/80   Pulse (!) 108   Ht 5' 4"  (1.626 m)   Wt 146 lb 6 oz (66.4 kg)   SpO2 91%   BMI 25.13 kg/m   Neurological Exam: MENTAL STATUS including orientation to time, place, person, recent and remote memory, attention span and concentration, language, and fund of knowledge is normal.  Speech is not dysarthric.  CRANIAL NERVES: No visual field defects. Pupils equal round and reactive to light.  There is no opthalmoplegia on exam today. Facial muscles are 5/5.   Subtle left ptosis. Normal facial sensation.  Face is symmetric. Palate elevates symmetrically.  Tongue is midline. Facial muscles are 5/5.  MOTOR:  Motor  strength is 5/5 in all extremities.    COORDINATION/GAIT:    Gait narrow based and stable.   Data: Labs 08/26/2015:  AChR binding (0.63) blocking (28) antibody, ESR 3, CRP 4.9  MRI/A head 09/09/2015: This MR angiogram of the intracerebral arteries shows the following: 1. Very minimal atherosclerotic change within the left posterior cerebral artery that is unlikely to be clinically significant. 2. There is a normal variant with the left posterior cerebral artery obtaining its flow from the anterior circulation. 3. No aneurysms are seen. 4. No new findings compared to the 11/25/2003 MRA.   MRI orbit wwo contrast 09/09/2015:  This is is a normal MRI of the orbits with and without contrast.  IMPRESSION/PLAN: Seropositive ocular myasthenia gravis (diagnosed June 2017) without exacerbation  - Clinically, doing much better with improved double vision.  Exam with only mild right ptosis.   - Continue mestinon 38m three times daily  - She does not wish to start any other disease modifying therapies, including prednisone.  She has previously been on prednisone 431mdaily without noticeable  benefit.  Return to clinic in 3 months  The duration of this appointment visit was 20 minutes of face-to-face time with the patient.  Greater than 50% of this time was spent in counseling, explanation of diagnosis, planning of further management, and coordination of care.   Thank you for allowing me to participate in patient's care.  If I can answer any additional questions, I would be pleased to do so.    Sincerely,    Donika K. PaPosey ProntoDO

## 2016-04-20 DIAGNOSIS — N3010 Interstitial cystitis (chronic) without hematuria: Secondary | ICD-10-CM | POA: Diagnosis not present

## 2016-04-24 ENCOUNTER — Encounter: Payer: Self-pay | Admitting: Internal Medicine

## 2016-04-24 DIAGNOSIS — J449 Chronic obstructive pulmonary disease, unspecified: Secondary | ICD-10-CM

## 2016-04-24 DIAGNOSIS — N3010 Interstitial cystitis (chronic) without hematuria: Secondary | ICD-10-CM | POA: Diagnosis not present

## 2016-04-24 NOTE — Addendum Note (Signed)
Addended by: Desmond Dike C on: 04/24/2016 03:09 PM   Modules accepted: Orders

## 2016-04-24 NOTE — Telephone Encounter (Signed)
Please advise Dr Annamaria Boots. Thanks.  ----- Message -----  From: Raynelle Bring. Ina Homes  Sent: 2/27/20181:23 AM EST  To: Deneise Lever, MD Subject: Visit Follow-Up Question  Dr Kerby Less the results of pulmonary test.Any  changes you can suggest?  Jodene Nam  [email or web address]

## 2016-04-24 NOTE — Telephone Encounter (Signed)
The formal pulmonary function test again shows changes consistent with severe COPD, similar to the office test done last month. This is the kind of pattern we see in people with a significant smoking history.  It is possible there will be some improvement as we get farther away from the persistent bronchitis of this winter, so we will look at it again at the next office visit. For now, continue with the inhaled medicines. I am ordering a blood test to see if there is an emphysema gene  Please order a1AT phenotype       Dx COPD mixed type

## 2016-04-25 NOTE — Telephone Encounter (Signed)
Patient aware of results/recs. Will come by to get labs drawn. Pt forwarded this information as well.  Please advise:  Something I forgot to tell you is, my birth mother had   asthma and my son from the time he was a baby, had   asthma.  Took him to [facility] to a pedestrian who  treated him for asthma. He was on shots from a baby to   He went to college. So genetic?   Jodene Nam

## 2016-04-26 DIAGNOSIS — N3010 Interstitial cystitis (chronic) without hematuria: Secondary | ICD-10-CM | POA: Diagnosis not present

## 2016-04-27 ENCOUNTER — Other Ambulatory Visit: Payer: Medicare Other

## 2016-04-27 DIAGNOSIS — J449 Chronic obstructive pulmonary disease, unspecified: Secondary | ICD-10-CM | POA: Diagnosis not present

## 2016-05-01 DIAGNOSIS — N3010 Interstitial cystitis (chronic) without hematuria: Secondary | ICD-10-CM | POA: Diagnosis not present

## 2016-05-01 DIAGNOSIS — R5383 Other fatigue: Secondary | ICD-10-CM | POA: Diagnosis not present

## 2016-05-02 NOTE — Progress Notes (Signed)
PFT done.  

## 2016-05-03 LAB — ALPHA-1 ANTITRYPSIN PHENOTYPE: A1 ANTITRYPSIN: 159 mg/dL (ref 83–199)

## 2016-05-04 DIAGNOSIS — N3010 Interstitial cystitis (chronic) without hematuria: Secondary | ICD-10-CM | POA: Diagnosis not present

## 2016-05-08 ENCOUNTER — Ambulatory Visit: Payer: Medicare Other | Admitting: Internal Medicine

## 2016-05-08 DIAGNOSIS — N3010 Interstitial cystitis (chronic) without hematuria: Secondary | ICD-10-CM | POA: Diagnosis not present

## 2016-05-09 ENCOUNTER — Telehealth: Payer: Self-pay

## 2016-05-09 ENCOUNTER — Encounter: Payer: Self-pay | Admitting: Internal Medicine

## 2016-05-09 NOTE — Telephone Encounter (Signed)
Spoke with Erica Drake, she wants to know what CY thinks about finding mold in her house along the baseboard. She wants to know how it can affect her? Please advise.

## 2016-05-10 NOTE — Telephone Encounter (Signed)
Humidity encourages mold, so the first step is always to reduce humidity. You may find your new home is comfortable without a humidifier. Are people in other units experiencing a mold problem? You can often find a device for measuring humidity- either like a simple wall-mounted weather tracker that has a thermometer, humidistat and barometric pressure (most hardware stores have these), or at a garden supply place like [employer] [place] or [employer]/ [employer]. That way you can keep track of the humidity in your home. Most people are comfortable at around 40-45% relative humidity in doors, depending on the type of heating system.

## 2016-05-10 NOTE — Telephone Encounter (Signed)
Attempted to contact pt. No answer, no option to leave a message. Will try back.  

## 2016-05-10 NOTE — Telephone Encounter (Signed)
Dr. Annamaria Boots   Please Advise- Please see pt email

## 2016-05-15 DIAGNOSIS — N3010 Interstitial cystitis (chronic) without hematuria: Secondary | ICD-10-CM | POA: Diagnosis not present

## 2016-05-29 DIAGNOSIS — N3010 Interstitial cystitis (chronic) without hematuria: Secondary | ICD-10-CM | POA: Diagnosis not present

## 2016-05-31 DIAGNOSIS — N3010 Interstitial cystitis (chronic) without hematuria: Secondary | ICD-10-CM | POA: Diagnosis not present

## 2016-06-04 DIAGNOSIS — N183 Chronic kidney disease, stage 3 (moderate): Secondary | ICD-10-CM | POA: Diagnosis not present

## 2016-06-04 DIAGNOSIS — E119 Type 2 diabetes mellitus without complications: Secondary | ICD-10-CM | POA: Diagnosis not present

## 2016-06-04 DIAGNOSIS — K582 Mixed irritable bowel syndrome: Secondary | ICD-10-CM | POA: Diagnosis not present

## 2016-06-04 DIAGNOSIS — K5733 Diverticulitis of large intestine without perforation or abscess with bleeding: Secondary | ICD-10-CM | POA: Diagnosis not present

## 2016-06-04 DIAGNOSIS — N3011 Interstitial cystitis (chronic) with hematuria: Secondary | ICD-10-CM | POA: Diagnosis not present

## 2016-06-04 DIAGNOSIS — G7000 Myasthenia gravis without (acute) exacerbation: Secondary | ICD-10-CM | POA: Diagnosis not present

## 2016-06-05 DIAGNOSIS — N3010 Interstitial cystitis (chronic) without hematuria: Secondary | ICD-10-CM | POA: Diagnosis not present

## 2016-06-08 DIAGNOSIS — N3010 Interstitial cystitis (chronic) without hematuria: Secondary | ICD-10-CM | POA: Diagnosis not present

## 2016-06-14 DIAGNOSIS — N3010 Interstitial cystitis (chronic) without hematuria: Secondary | ICD-10-CM | POA: Diagnosis not present

## 2016-06-19 DIAGNOSIS — N3010 Interstitial cystitis (chronic) without hematuria: Secondary | ICD-10-CM | POA: Diagnosis not present

## 2016-06-20 ENCOUNTER — Other Ambulatory Visit: Payer: Self-pay | Admitting: Orthopedic Surgery

## 2016-06-20 NOTE — Progress Notes (Signed)
Please place orders in EPIC as patient is being scheduled for a Pre-op appointment! Thank you! 

## 2016-06-26 DIAGNOSIS — N3010 Interstitial cystitis (chronic) without hematuria: Secondary | ICD-10-CM | POA: Diagnosis not present

## 2016-06-27 DIAGNOSIS — F341 Dysthymic disorder: Secondary | ICD-10-CM | POA: Diagnosis not present

## 2016-06-28 ENCOUNTER — Ambulatory Visit: Payer: Self-pay | Admitting: Orthopedic Surgery

## 2016-07-03 DIAGNOSIS — N3010 Interstitial cystitis (chronic) without hematuria: Secondary | ICD-10-CM | POA: Diagnosis not present

## 2016-07-05 DIAGNOSIS — N3010 Interstitial cystitis (chronic) without hematuria: Secondary | ICD-10-CM | POA: Diagnosis not present

## 2016-07-10 ENCOUNTER — Other Ambulatory Visit (HOSPITAL_COMMUNITY): Payer: Self-pay | Admitting: Emergency Medicine

## 2016-07-10 DIAGNOSIS — N3010 Interstitial cystitis (chronic) without hematuria: Secondary | ICD-10-CM | POA: Diagnosis not present

## 2016-07-10 NOTE — Progress Notes (Addendum)
LOV neuro Posey Pronto 04-18-16 epic LOV pulmonary Young 03-16-16 epic EKG 12-01-15 epic CXR 03-16-16 epic

## 2016-07-10 NOTE — Patient Instructions (Addendum)
Erica Drake  07/10/2016   Your procedure is scheduled on: 07-18-16  Report to [place]  elevators to 3rd floor to  [place] at 2:45PM.  Call this number if you have problems the morning of surgery  [phone]   Remember: ONLY 1 PERSON MAY GO WITH YOU TO SHORT STAY TO GET  READY MORNING OF Erica Drake.  Do not eat food After Midnight. You may have clear liquids from midnight until 845am. Nothing by mouth after 845am !!     Take these medicines the morning of surgery with A SIP OF WATER: pyridostigmine(mestinon), bupropion(Wellbutrin), clonazepam(Klonopin), Dexilant, nasal and oral inhalers as needed (may bring to hospital with you)                                   You may not have any metal on your body including hair pins and              piercings  Do not wear jewelry, make-up, lotions, powders or perfumes, deodorant             Do not wear nail polish.  Do not shave  48 hours prior to surgery.     Do not bring valuables to the hospital. [place].  Contacts, dentures or bridgework may not be worn into surgery.       Patients discharged the day of surgery will not be allowed to drive home.  Name and phone number of your driver:  Special Instructions: N/A              Please read over the following fact sheets you were given: _____________________________________________________________________   CLEAR LIQUID DIET   Foods Allowed                                                                     Foods Excluded  Coffee and tea, regular and decaf                             liquids that you cannot  Plain Jell-O in any flavor                                             see through such as: Fruit ices (not with fruit pulp)                                     milk, soups, orange juice  Iced Popsicles                                    All solid food Carbonated  beverages, regular  and diet                                    Cranberry, grape and apple juices Sports drinks like Gatorade Lightly seasoned clear broth or consume(fat free) Sugar, honey syrup  Sample Menu Breakfast                                Lunch                                     Supper Cranberry juice                    Beef broth                            Chicken broth Jell-O                                     Grape juice                           Apple juice Coffee or tea                        Jell-O                                      Popsicle                                                Coffee or tea                        Coffee or tea  _____________________________________________________________________   How to Manage Your Diabetes Before and After Surgery  Why is it important to control my blood sugar before and after surgery? . Improving blood sugar levels before and after surgery helps healing and can limit problems. . A way of improving blood sugar control is eating a healthy diet by: o  Eating less sugar and carbohydrates o  Increasing activity/exercise o  Talking with your doctor about reaching your blood sugar goals . High blood sugars (greater than 180 mg/dL) can raise your risk of infections and slow your recovery, so you will need to focus on controlling your diabetes during the weeks before surgery. . Make sure that the doctor who takes care of your diabetes knows about your planned surgery including the date and location.  How do I manage my blood sugar before surgery? . Check your blood sugar at least 4 times a day, starting 2 days before surgery, to make sure that the level is not too high or low. o Check your blood sugar the morning of your surgery when you wake up and every 2 hours until you get to the Short Stay unit. . If your blood sugar is less than 70 mg/dL, you will need  to treat for low blood sugar: o Do not take insulin. o Treat a low  blood sugar (less than 70 mg/dL) with  cup of clear juice (cranberry or apple), 4 glucose tablets, OR glucose gel. o Recheck blood sugar in 15 minutes after treatment (to make sure it is greater than 70 mg/dL). If your blood sugar is not greater than 70 mg/dL on recheck, call [phone] for further instructions. . Report your blood sugar to the short stay nurse when you get to Short Stay.  . If you are admitted to the hospital after surgery: o Your blood sugar will be checked by the staff and you will probably be given insulin after surgery (instead of oral diabetes medicines) to make sure you have good blood sugar levels. o The goal for blood sugar control after surgery is 80-180 mg/dL.   Patient Signature:  Date:   Nurse Signature:  Date:   Reviewed and Endorsed by [facility] Patient Education Committee, August 2015   [facility] - Preparing for Surgery Before surgery, you can play an important role.  Because skin is not sterile, your skin needs to be as free of germs as possible.  You can reduce the number of germs on your skin by washing with GOLD DIAL ANTIBACTERIAL soap before surgery.  GOLD DIAL ANTIBACTERIAL is an antiseptic cleaner which kills germs and bonds with the skin to continue killing germs even after washing.  Do not shave (including legs and underarms) for at least 48 hours prior to the first CHG shower.  You may shave your face/neck. Please follow these instructions carefully:  1.  Shower with GOLD DIAL ANTIBACTERIAL Soap the night before surgery and the  morning of Surgery.  2.  If you choose to wash your hair, wash your hair first as usual with your  normal  shampoo.  3.  After you shampoo, rinse your hair and body thoroughly to remove the  shampoo.                           4.  Use GOLD DIAL ANTIBACTERIALas you would any other liquid soap.  You can apply GOLD DIAL ANTIBACTERIALdirectly  to the skin and wash                       Gently with a scrungie or clean  washcloth.  5.  Apply the [place] to your body ONLY FROM THE NECK DOWN.   Do not use on face/ open                           Wound or open sores. Avoid contact with eyes, ears mouth and genitals (private parts).                       Wash face,  Genitals (private parts) with your GOLD DIAL ANTIBACTERIAL             6.  Wash thoroughly, paying special attention to the area where your surgery  will be performed.  7.  Thoroughly rinse your body with warm water from the neck down.                 9.  Pat yourself dry with a clean towel.            10.  Wear clean pajamas.  11.  Place clean sheets on your bed the night of your first shower and do not  sleep with pets. Day of Surgery : Do not apply any lotions/deodorants the morning of surgery.  Please wear clean clothes to the hospital/surgery center.  FAILURE TO FOLLOW THESE INSTRUCTIONS MAY RESULT IN THE CANCELLATION OF YOUR SURGERY PATIENT SIGNATURE_________________________________  NURSE SIGNATURE__________________________________  ________________________________________________________________________   Adam Phenix  An incentive spirometer is a tool that can help keep your lungs clear and active. This tool measures how well you are filling your lungs with each breath. Taking long deep breaths may help reverse or decrease the chance of developing breathing (pulmonary) problems (especially infection) following:  A long period of time when you are unable to move or be active. BEFORE THE PROCEDURE   If the spirometer includes an indicator to show your best effort, your nurse or respiratory therapist will set it to a desired goal.  If possible, sit up straight or lean slightly forward. Try not to slouch.  Hold the incentive spirometer in an upright position. INSTRUCTIONS FOR USE  1. Sit on the edge of your bed if possible, or sit up as far as you can in bed or on a chair. 2. Hold the incentive  spirometer in an upright position. 3. Breathe out normally. 4. Place the mouthpiece in your mouth and seal your lips tightly around it. 5. Breathe in slowly and as deeply as possible, raising the piston or the ball toward the top of the column. 6. Hold your breath for 3-5 seconds or for as long as possible. Allow the piston or ball to fall to the bottom of the column. 7. Remove the mouthpiece from your mouth and breathe out normally. 8. Rest for a few seconds and repeat Steps 1 through 7 at least 10 times every 1-2 hours when you are awake. Take your time and take a few normal breaths between deep breaths. 9. The spirometer may include an indicator to show your best effort. Use the indicator as a goal to work toward during each repetition. 10. After each set of 10 deep breaths, practice coughing to be sure your lungs are clear. If you have an incision (the cut made at the time of surgery), support your incision when coughing by placing a pillow or rolled up towels firmly against it. Once you are able to get out of bed, walk around indoors and cough well. You may stop using the incentive spirometer when instructed by your caregiver.  RISKS AND COMPLICATIONS  Take your time so you do not get dizzy or light-headed.  If you are in pain, you may need to take or ask for pain medication before doing incentive spirometry. It is harder to take a deep breath if you are having pain. AFTER USE  Rest and breathe slowly and easily.  It can be helpful to keep track of a log of your progress. Your caregiver can provide you with a simple table to help with this. If you are using the spirometer at home, follow these instructions: [place] IF:   You are having difficultly using the spirometer.  You have trouble using the spirometer as often as instructed.  Your pain medication is not giving enough relief while using the spirometer.  You develop fever of 100.5 F (38.1 C) or higher. SEEK  IMMEDIATE MEDICAL CARE IF:   You cough up bloody sputum that had not been present before.  You develop fever of 102 F (38.9 C)  or greater.  You develop worsening pain at or near the incision site. MAKE SURE YOU:   Understand these instructions.  Will watch your condition.  Will get help right away if you are not doing well or get worse. Document Released: 06/25/2006 Document Revised: 05/07/2011 Document Reviewed: 08/26/2006 [facility] Patient Information 2014 [place], [place].   ________________________________________________________________________

## 2016-07-11 ENCOUNTER — Encounter (HOSPITAL_COMMUNITY): Payer: Self-pay

## 2016-07-11 ENCOUNTER — Encounter (HOSPITAL_COMMUNITY)
Admission: RE | Admit: 2016-07-11 | Discharge: 2016-07-11 | Disposition: A | Discharge status: Home / Self Care | Payer: Medicare Other | Source: Ambulatory Visit | Attending: Orthopedic Surgery | Admitting: Orthopedic Surgery

## 2016-07-11 DIAGNOSIS — Z01812 Encounter for preprocedural laboratory examination: Secondary | ICD-10-CM | POA: Insufficient documentation

## 2016-07-11 DIAGNOSIS — X58XXXA Exposure to other specified factors, initial encounter: Secondary | ICD-10-CM | POA: Insufficient documentation

## 2016-07-11 DIAGNOSIS — S83242A Other tear of medial meniscus, current injury, left knee, initial encounter: Secondary | ICD-10-CM | POA: Insufficient documentation

## 2016-07-11 HISTORY — DX: Adverse effect of unspecified anesthetic, initial encounter: T41.45XA

## 2016-07-11 HISTORY — DX: Type 2 diabetes mellitus without complications: E11.9

## 2016-07-11 HISTORY — DX: Other complications of anesthesia, initial encounter: T88.59XA

## 2016-07-11 HISTORY — DX: Personal history of other diseases of the nervous system and sense organs: Z86.69

## 2016-07-11 LAB — SURGICAL PCR SCREEN
MRSA, PCR: NEGATIVE
Staphylococcus aureus: NEGATIVE

## 2016-07-11 LAB — CBC
HEMATOCRIT: 41.5 % (ref 36.0–46.0)
Hemoglobin: 13.3 g/dL (ref 12.0–15.0)
MCH: 28.8 pg (ref 26.0–34.0)
MCHC: 32 g/dL (ref 30.0–36.0)
MCV: 89.8 fL (ref 78.0–100.0)
PLATELETS: 341 10*3/uL (ref 150–400)
RBC: 4.62 MIL/uL (ref 3.87–5.11)
RDW: 15.4 % (ref 11.5–15.5)
WBC: 6.2 10*3/uL (ref 4.0–10.5)

## 2016-07-11 LAB — BASIC METABOLIC PANEL
ANION GAP: 5 (ref 5–15)
BUN: 18 mg/dL (ref 6–20)
CO2: 30 mmol/L (ref 22–32)
Calcium: 8.7 mg/dL — ABNORMAL LOW (ref 8.9–10.3)
Chloride: 105 mmol/L (ref 101–111)
Creatinine, Ser: 1.36 mg/dL — ABNORMAL HIGH (ref 0.44–1.00)
GFR, EST AFRICAN AMERICAN: 42 mL/min — AB (ref >60)
GFR, EST NON AFRICAN AMERICAN: 36 mL/min — AB (ref >60)
Glucose, Bld: 110 mg/dL — ABNORMAL HIGH (ref 65–99)
POTASSIUM: 4.1 mmol/L (ref 3.5–5.1)
SODIUM: 140 mmol/L (ref 135–145)

## 2016-07-11 NOTE — Progress Notes (Signed)
BMP results routed via epic to [place]

## 2016-07-12 DIAGNOSIS — M170 Bilateral primary osteoarthritis of knee: Secondary | ICD-10-CM | POA: Diagnosis not present

## 2016-07-12 DIAGNOSIS — M23322 Other meniscus derangements, posterior horn of medial meniscus, left knee: Secondary | ICD-10-CM | POA: Diagnosis not present

## 2016-07-12 LAB — HEMOGLOBIN A1C
Hgb A1c MFr Bld: 5.9 % — ABNORMAL HIGH (ref 4.8–5.6)
MEAN PLASMA GLUCOSE: 123 mg/dL

## 2016-07-13 DIAGNOSIS — Z85828 Personal history of other malignant neoplasm of skin: Secondary | ICD-10-CM | POA: Diagnosis not present

## 2016-07-13 DIAGNOSIS — L820 Inflamed seborrheic keratosis: Secondary | ICD-10-CM | POA: Diagnosis not present

## 2016-07-13 DIAGNOSIS — D1801 Hemangioma of skin and subcutaneous tissue: Secondary | ICD-10-CM | POA: Diagnosis not present

## 2016-07-13 DIAGNOSIS — L821 Other seborrheic keratosis: Secondary | ICD-10-CM | POA: Diagnosis not present

## 2016-07-16 DIAGNOSIS — N3010 Interstitial cystitis (chronic) without hematuria: Secondary | ICD-10-CM | POA: Diagnosis not present

## 2016-07-18 ENCOUNTER — Ambulatory Visit (HOSPITAL_COMMUNITY): Payer: Medicare Other | Admitting: Anesthesiology

## 2016-07-18 ENCOUNTER — Encounter (HOSPITAL_COMMUNITY)
Admission: RE | Disposition: A | Discharge status: Home / Self Care | Payer: Self-pay | Source: Ambulatory Visit | Attending: Orthopedic Surgery

## 2016-07-18 ENCOUNTER — Encounter (HOSPITAL_COMMUNITY): Payer: Self-pay | Admitting: *Deleted

## 2016-07-18 ENCOUNTER — Observation Stay (HOSPITAL_COMMUNITY)
Admission: RE | Admit: 2016-07-18 | Discharge: 2016-07-19 | Disposition: A | Discharge status: Home / Self Care | Payer: Medicare Other | Source: Ambulatory Visit | Attending: Orthopedic Surgery | Admitting: Orthopedic Surgery

## 2016-07-18 DIAGNOSIS — G7000 Myasthenia gravis without (acute) exacerbation: Secondary | ICD-10-CM | POA: Insufficient documentation

## 2016-07-18 DIAGNOSIS — S83242D Other tear of medial meniscus, current injury, left knee, subsequent encounter: Secondary | ICD-10-CM

## 2016-07-18 DIAGNOSIS — K219 Gastro-esophageal reflux disease without esophagitis: Secondary | ICD-10-CM | POA: Insufficient documentation

## 2016-07-18 DIAGNOSIS — E119 Type 2 diabetes mellitus without complications: Secondary | ICD-10-CM | POA: Insufficient documentation

## 2016-07-18 DIAGNOSIS — X58XXXA Exposure to other specified factors, initial encounter: Secondary | ICD-10-CM | POA: Diagnosis not present

## 2016-07-18 DIAGNOSIS — Y939 Activity, unspecified: Secondary | ICD-10-CM | POA: Diagnosis not present

## 2016-07-18 DIAGNOSIS — Y929 Unspecified place or not applicable: Secondary | ICD-10-CM | POA: Diagnosis not present

## 2016-07-18 DIAGNOSIS — Z79899 Other long term (current) drug therapy: Secondary | ICD-10-CM | POA: Diagnosis not present

## 2016-07-18 DIAGNOSIS — S83242A Other tear of medial meniscus, current injury, left knee, initial encounter: Secondary | ICD-10-CM | POA: Diagnosis not present

## 2016-07-18 DIAGNOSIS — Z885 Allergy status to narcotic agent status: Secondary | ICD-10-CM | POA: Diagnosis not present

## 2016-07-18 DIAGNOSIS — M797 Fibromyalgia: Secondary | ICD-10-CM | POA: Diagnosis not present

## 2016-07-18 DIAGNOSIS — K811 Chronic cholecystitis: Secondary | ICD-10-CM | POA: Diagnosis not present

## 2016-07-18 DIAGNOSIS — J449 Chronic obstructive pulmonary disease, unspecified: Secondary | ICD-10-CM | POA: Diagnosis not present

## 2016-07-18 DIAGNOSIS — R5382 Chronic fatigue, unspecified: Secondary | ICD-10-CM | POA: Insufficient documentation

## 2016-07-18 DIAGNOSIS — S83249A Other tear of medial meniscus, current injury, unspecified knee, initial encounter: Secondary | ICD-10-CM | POA: Diagnosis present

## 2016-07-18 HISTORY — PX: KNEE ARTHROSCOPY WITH MEDIAL MENISECTOMY: SHX5651

## 2016-07-18 LAB — GLUCOSE, CAPILLARY: Glucose-Capillary: 81 mg/dL (ref 65–99)

## 2016-07-18 SURGERY — ARTHROSCOPY, KNEE, WITH MEDIAL MENISCECTOMY
Anesthesia: General | Site: Knee | Laterality: Left

## 2016-07-18 MED ORDER — UMECLIDINIUM-VILANTEROL 62.5-25 MCG/INH IN AEPB
1.0000 | INHALATION_SPRAY | Freq: Every day | RESPIRATORY_TRACT | Status: DC
  Administered 2016-07-19: 1 via RESPIRATORY_TRACT
  Filled 2016-07-18: qty 14

## 2016-07-18 MED ORDER — PANTOPRAZOLE SODIUM 40 MG PO TBEC
40.0000 mg | DELAYED_RELEASE_TABLET | Freq: Every day | ORAL | Status: DC
  Administered 2016-07-19: 40 mg via ORAL
  Filled 2016-07-18: qty 1

## 2016-07-18 MED ORDER — ACETAMINOPHEN 325 MG PO TABS: 650.0000 mg | ORAL_TABLET | Freq: Four times a day (QID) | ORAL | Status: DC | PRN

## 2016-07-18 MED ORDER — BUPROPION HCL ER (SR) 100 MG PO TB12
200.0000 mg | ORAL_TABLET | Freq: Two times a day (BID) | ORAL | Status: DC
  Administered 2016-07-19: 200 mg via ORAL
  Filled 2016-07-18 (×2): qty 2

## 2016-07-18 MED ORDER — HYDROCODONE-ACETAMINOPHEN 5-325 MG PO TABS
1.0000 | ORAL_TABLET | ORAL | Status: DC | PRN
  Administered 2016-07-18: 2 via ORAL
  Administered 2016-07-19: 1 via ORAL
  Administered 2016-07-19 (×2): 2 via ORAL
  Filled 2016-07-18: qty 2
  Filled 2016-07-18: qty 1
  Filled 2016-07-18 (×3): qty 2

## 2016-07-18 MED ORDER — HYDROCODONE-ACETAMINOPHEN 5-325 MG PO TABS: 1.0000 | ORAL_TABLET | ORAL | 0 refills | Status: DC | PRN

## 2016-07-18 MED ORDER — ONDANSETRON HCL 4 MG PO TABS: 4.0000 mg | ORAL_TABLET | Freq: Three times a day (TID) | ORAL | 0 refills | Status: DC | PRN

## 2016-07-18 MED ORDER — BUPIVACAINE HCL (PF) 0.25 % IJ SOLN
INTRAMUSCULAR | Status: DC | PRN
  Administered 2016-07-18: 20 mL

## 2016-07-18 MED ORDER — ALBUTEROL SULFATE (2.5 MG/3ML) 0.083% IN NEBU
INHALATION_SOLUTION | RESPIRATORY_TRACT | Status: AC
  Administered 2016-07-18: 2.5 mg via RESPIRATORY_TRACT
  Filled 2016-07-18: qty 3

## 2016-07-18 MED ORDER — FENTANYL CITRATE (PF) 100 MCG/2ML IJ SOLN
25.0000 ug | INTRAMUSCULAR | Status: DC | PRN
  Administered 2016-07-18 (×2): 25 ug via INTRAVENOUS

## 2016-07-18 MED ORDER — ACETAMINOPHEN 10 MG/ML IV SOLN
INTRAVENOUS | Status: AC
  Filled 2016-07-18: qty 100

## 2016-07-18 MED ORDER — ONDANSETRON HCL 4 MG PO TABS: 4.0000 mg | ORAL_TABLET | Freq: Four times a day (QID) | ORAL | Status: DC | PRN

## 2016-07-18 MED ORDER — ALBUTEROL SULFATE (2.5 MG/3ML) 0.083% IN NEBU
2.5000 mg | INHALATION_SOLUTION | Freq: Once | RESPIRATORY_TRACT | Status: AC
  Administered 2016-07-18: 2.5 mg via RESPIRATORY_TRACT

## 2016-07-18 MED ORDER — ASPIRIN 325 MG PO TABS
325.0000 mg | ORAL_TABLET | Freq: Every day | ORAL | Status: DC
  Administered 2016-07-19: 325 mg via ORAL
  Filled 2016-07-18: qty 1

## 2016-07-18 MED ORDER — METOCLOPRAMIDE HCL 5 MG/ML IJ SOLN: 5.0000 mg | Freq: Three times a day (TID) | INTRAMUSCULAR | Status: DC | PRN

## 2016-07-18 MED ORDER — CLONAZEPAM 1 MG PO TABS
1.0000 mg | ORAL_TABLET | Freq: Two times a day (BID) | ORAL | Status: DC
  Administered 2016-07-18 – 2016-07-19 (×2): 1 mg via ORAL
  Filled 2016-07-18: qty 2
  Filled 2016-07-18: qty 1

## 2016-07-18 MED ORDER — POVIDONE-IODINE 10 % EX SWAB: 2.0000 "application " | Freq: Once | CUTANEOUS | Status: DC

## 2016-07-18 MED ORDER — ONDANSETRON HCL 4 MG/2ML IJ SOLN: 4.0000 mg | Freq: Four times a day (QID) | INTRAMUSCULAR | Status: DC | PRN

## 2016-07-18 MED ORDER — PROPOFOL 10 MG/ML IV BOLUS
INTRAVENOUS | Status: AC
  Filled 2016-07-18: qty 20

## 2016-07-18 MED ORDER — PROPOFOL 10 MG/ML IV BOLUS
INTRAVENOUS | Status: DC | PRN
  Administered 2016-07-18: 150 mg via INTRAVENOUS

## 2016-07-18 MED ORDER — ACETAMINOPHEN 10 MG/ML IV SOLN
1000.0000 mg | Freq: Once | INTRAVENOUS | Status: AC
  Administered 2016-07-18: 1000 mg via INTRAVENOUS

## 2016-07-18 MED ORDER — PYRIDOSTIGMINE BROMIDE 60 MG PO TABS
60.0000 mg | ORAL_TABLET | Freq: Three times a day (TID) | ORAL | Status: DC
  Administered 2016-07-19: 60 mg via ORAL
  Filled 2016-07-18 (×3): qty 1

## 2016-07-18 MED ORDER — ONDANSETRON HCL 4 MG/2ML IJ SOLN
INTRAMUSCULAR | Status: AC
  Filled 2016-07-18: qty 2

## 2016-07-18 MED ORDER — ACETAMINOPHEN 650 MG RE SUPP: 650.0000 mg | Freq: Four times a day (QID) | RECTAL | Status: DC | PRN

## 2016-07-18 MED ORDER — SODIUM CHLORIDE 0.9 % IV SOLN
INTRAVENOUS | Status: DC
  Administered 2016-07-18: 21:00:00 via INTRAVENOUS

## 2016-07-18 MED ORDER — FENTANYL CITRATE (PF) 100 MCG/2ML IJ SOLN
INTRAMUSCULAR | Status: DC | PRN
  Administered 2016-07-18: 15 ug via INTRAVENOUS
  Administered 2016-07-18: 25 ug via INTRAVENOUS
  Administered 2016-07-18: 15 ug via INTRAVENOUS
  Administered 2016-07-18: 25 ug via INTRAVENOUS
  Administered 2016-07-18: 20 ug via INTRAVENOUS

## 2016-07-18 MED ORDER — METHOCARBAMOL 500 MG PO TABS: 500.0000 mg | ORAL_TABLET | Freq: Four times a day (QID) | ORAL | 1 refills | Status: DC

## 2016-07-18 MED ORDER — LACTATED RINGERS IV SOLN
INTRAVENOUS | Status: DC
  Administered 2016-07-18 (×3): via INTRAVENOUS

## 2016-07-18 MED ORDER — CEFAZOLIN SODIUM-DEXTROSE 2-4 GM/100ML-% IV SOLN
INTRAVENOUS | Status: AC
  Filled 2016-07-18: qty 100

## 2016-07-18 MED ORDER — FENTANYL CITRATE (PF) 100 MCG/2ML IJ SOLN
INTRAMUSCULAR | Status: AC
  Filled 2016-07-18: qty 2

## 2016-07-18 MED ORDER — BUPIVACAINE HCL (PF) 0.25 % IJ SOLN
INTRAMUSCULAR | Status: AC
  Filled 2016-07-18: qty 30

## 2016-07-18 MED ORDER — FENTANYL CITRATE (PF) 100 MCG/2ML IJ SOLN
INTRAMUSCULAR | Status: AC
  Administered 2016-07-18: 25 ug via INTRAVENOUS
  Filled 2016-07-18: qty 2

## 2016-07-18 MED ORDER — CEFAZOLIN SODIUM-DEXTROSE 2-4 GM/100ML-% IV SOLN
2.0000 g | INTRAVENOUS | Status: AC
  Administered 2016-07-18: 2 g via INTRAVENOUS

## 2016-07-18 MED ORDER — FENTANYL CITRATE (PF) 100 MCG/2ML IJ SOLN
INTRAMUSCULAR | Status: AC
  Administered 2016-07-18: 25 ug via INTRAVENOUS
  Filled 2016-07-18: qty 4

## 2016-07-18 MED ORDER — METOCLOPRAMIDE HCL 5 MG PO TABS
5.0000 mg | ORAL_TABLET | Freq: Three times a day (TID) | ORAL | Status: DC | PRN
  Administered 2016-07-19: 10 mg via ORAL
  Filled 2016-07-18: qty 2

## 2016-07-18 MED ORDER — ALBUTEROL SULFATE HFA 108 (90 BASE) MCG/ACT IN AERS: 1.0000 | INHALATION_SPRAY | RESPIRATORY_TRACT | Status: DC | PRN

## 2016-07-18 MED ORDER — ONDANSETRON HCL 4 MG/2ML IJ SOLN
INTRAMUSCULAR | Status: DC | PRN
  Administered 2016-07-18: 4 mg via INTRAVENOUS

## 2016-07-18 MED ORDER — LIDOCAINE-EPINEPHRINE 1 %-1:100000 IJ SOLN
INTRAMUSCULAR | Status: AC
  Filled 2016-07-18: qty 1

## 2016-07-18 MED ORDER — DEXAMETHASONE SODIUM PHOSPHATE 10 MG/ML IJ SOLN
INTRAMUSCULAR | Status: AC
  Filled 2016-07-18: qty 1

## 2016-07-18 MED ORDER — DEXTROSE 5 % IV SOLN
500.0000 mg | Freq: Four times a day (QID) | INTRAVENOUS | Status: DC | PRN
  Filled 2016-07-18: qty 5

## 2016-07-18 MED ORDER — ALBUTEROL SULFATE (2.5 MG/3ML) 0.083% IN NEBU: 2.5000 mg | INHALATION_SOLUTION | Freq: Four times a day (QID) | RESPIRATORY_TRACT | Status: DC | PRN

## 2016-07-18 MED ORDER — METHOCARBAMOL 500 MG PO TABS
500.0000 mg | ORAL_TABLET | Freq: Four times a day (QID) | ORAL | Status: DC | PRN
  Administered 2016-07-18: 500 mg via ORAL
  Filled 2016-07-18: qty 1

## 2016-07-18 MED ORDER — LIDOCAINE 2% (20 MG/ML) 5 ML SYRINGE
INTRAMUSCULAR | Status: DC | PRN
  Administered 2016-07-18: 50 mg via INTRAVENOUS

## 2016-07-18 SURGICAL SUPPLY — 26 items
BANDAGE ACE 6X5 VEL STRL LF (GAUZE/BANDAGES/DRESSINGS) ×2 IMPLANT
BLADE 4.2CUDA (BLADE) ×2 IMPLANT
COVER SURGICAL LIGHT HANDLE (MISCELLANEOUS) ×2 IMPLANT
CUFF TOURN SGL QUICK 34 (TOURNIQUET CUFF) ×2
CUFF TRNQT CYL 34X4X40X1 (TOURNIQUET CUFF) ×1 IMPLANT
DRAPE U-SHAPE 47X51 STRL (DRAPES) ×2 IMPLANT
DRSG EMULSION OIL 3X3 NADH (GAUZE/BANDAGES/DRESSINGS) ×2 IMPLANT
DRSG PAD ABDOMINAL 8X10 ST (GAUZE/BANDAGES/DRESSINGS) ×2 IMPLANT
DURAPREP 26ML APPLICATOR (WOUND CARE) ×2 IMPLANT
GAUZE SPONGE 4X4 12PLY STRL (GAUZE/BANDAGES/DRESSINGS) ×2 IMPLANT
GLOVE BIO SURGEON STRL SZ8 (GLOVE) ×2 IMPLANT
GLOVE BIOGEL PI IND STRL 8 (GLOVE) ×1 IMPLANT
GLOVE BIOGEL PI INDICATOR 8 (GLOVE) ×1
GOWN STRL REUS W/TWL LRG LVL3 (GOWN DISPOSABLE) ×2 IMPLANT
KIT BASIN OR (CUSTOM PROCEDURE TRAY) ×2 IMPLANT
MANIFOLD NEPTUNE II (INSTRUMENTS) ×2 IMPLANT
PACK ARTHROSCOPY WL (CUSTOM PROCEDURE TRAY) ×2 IMPLANT
PACK ICE MAXI GEL EZY WRAP (MISCELLANEOUS) ×6 IMPLANT
PADDING CAST COTTON 6X4 STRL (CAST SUPPLIES) ×3 IMPLANT
POSITIONER SURGICAL ARM (MISCELLANEOUS) ×2 IMPLANT
PROBE BIPOLAR 50 DEGREE SUCT (MISCELLANEOUS) ×1 IMPLANT
SUT ETHILON 4 0 PS 2 18 (SUTURE) ×2 IMPLANT
TOWEL OR 17X26 10 PK STRL BLUE (TOWEL DISPOSABLE) ×1 IMPLANT
TUBING ARTHRO INFLOW-ONLY STRL (TUBING) ×2 IMPLANT
WAND HAND CNTRL MULTIVAC 90 (MISCELLANEOUS) IMPLANT
WRAP KNEE MAXI GEL POST OP (GAUZE/BANDAGES/DRESSINGS) ×4 IMPLANT

## 2016-07-18 NOTE — Anesthesia Procedure Notes (Signed)
Procedure Name: LMA Insertion Date/Time: 07/18/2016 4:20 PM Performed by: Claudia Desanctis Pre-anesthesia Checklist: Emergency Drugs available, Patient identified, Suction available and Patient being monitored Patient Re-evaluated:Patient Re-evaluated prior to inductionOxygen Delivery Method: Circle system utilized Preoxygenation: Pre-oxygenation with 100% oxygen Intubation Type: IV induction Ventilation: Mask ventilation without difficulty LMA: LMA inserted LMA Size: 4.0 Number of attempts: 1 Placement Confirmation: positive ETCO2 and breath sounds checked- equal and bilateral Tube secured with: Tape Dental Injury: Teeth and Oropharynx as per pre-operative assessment  Comments: 3 LMA placed first with large leak noted    Removed and 4 LMA inserted without difficulty

## 2016-07-18 NOTE — H&P (Signed)
CC- ROZANNA CORMANY is a 81 y.o. female who presents with left knee pain.  HPI- . Knee Pain: Patient presents with knee pain involving the  left knee. Onset of the symptoms was several months ago. Inciting event: none known. Current symptoms include giving out, pain located medially, stiffness and swelling. Pain is aggravated by lateral movements, pivoting, rising after sitting, squatting and walking.  Patient has had no prior knee problems. Evaluation to date: MRI: abnormal medial meniscal tear. Treatment to date: corticosteroid injection which was not very effective.  Past Medical History:  Diagnosis Date  . Allergic rhinitis   . Chronic bronchitis    some mild wheezing at change of weather-  . Chronic fatigue   . Complication of anesthesia    pt reports she always has to be admitted after surgery with pulmon. consult  d/t respiratory history  . Decreased hearing    intol hearing aids. Deaf left ear, hearing aid right ear.  . Diabetes mellitus without complication ([place])   . Eustachian tube dysfunction    decreased hearing  . Fibromyalgia   . GERD (gastroesophageal reflux disease)    [place]  . Hx of myasthenia gravis   . Interstitial cystitis    [facility]-Dr. Karsten Ro sees periodically.(occ. self caths at home)  . Vertigo     Past Surgical History:  Procedure Laterality Date  . CHOLECYSTECTOMY N/A 01/14/2015   Procedure: LAPAROSCOPIC CHOLECYSTECTOMY WITH INTRAOPERATIVE CHOLANGIOGRAM;  Surgeon: Johnathan Hausen, MD;  Location: [facility];  Service: General;  Laterality: N/A;  . NISSEN FUNDOPLICATION     tx of GERD    Prior to Admission medications   Medication Sig Start Date End Date Taking? Authorizing Provider  albuterol (PROAIR HFA) 108 (90 Base) MCG/ACT inhaler Inhale 1-2 puffs into the lungs every 4 (four) hours as needed for wheezing or shortness of breath.    Yes [provider]  albuterol (PROVENTIL) (2.5 MG/3ML) 0.083% nebulizer solution Take 3 mLs (2.5 mg  total) by nebulization every 6 (six) hours as needed for wheezing or shortness of breath. 03/06/16  Yes Young, Tarri Fuller D, MD  buPROPion (WELLBUTRIN SR) 200 MG 12 hr tablet Take 200 mg by mouth 2 (two) times daily.    Yes [provider]  clonazePAM (KLONOPIN) 1 MG tablet Take 1 tablet by mouth 2 (two) times daily. 02/21/16  Yes [provider]  DEXILANT 60 MG capsule Take 60 mg by mouth daily. 11/09/14  Yes [provider]  pyridostigmine (MESTINON) 60 MG tablet Take 1 tablet (60 mg total) by mouth 3 (three) times daily. 10/25/15  Yes Patel, Donika K, DO  umeclidinium-vilanterol (ANORO ELLIPTA) 62.5-25 MCG/INH AEPB Inhale 1 puff into the lungs daily. 04/02/16  Yes Young, Tarri Fuller D, MD   KNEE EXAM antalgic gait, soft tissue tenderness over medial joint line, effusion, negative drawer sign, collateral ligaments intact  Physical Examination: General appearance - alert, well appearing, and in no distress Mental status - alert, oriented to person, place, and time Chest - clear to auscultation, no wheezes, rales or rhonchi, symmetric air entry Heart - normal rate, regular rhythm, normal S1, S2, no murmurs, rubs, clicks or gallops Neurological - alert, oriented, normal speech, no focal findings or movement disorder noted    Asessment/Plan--- Left knee medial meniscal tear- - Plan left knee arthroscopy with meniscal debridement. Procedure risks and potential comps discussed with patient who elects to proceed. Goals are decreased pain and increased function with a high likelihood of achieving both

## 2016-07-18 NOTE — Anesthesia Preprocedure Evaluation (Addendum)
Anesthesia Evaluation  Patient identified by MRN, date of birth, ID band Patient awake    Reviewed: Allergy & Precautions, NPO status , Patient's Chart, lab work & pertinent test results  Airway Mallampati: II  TM Distance: >3 FB     Dental   Pulmonary asthma , COPD, former smoker,    breath sounds clear to auscultation       Cardiovascular negative cardio ROS   Rhythm:Regular Rate:Normal     Neuro/Psych History noted. CG  Neuromuscular disease    GI/Hepatic Neg liver ROS, GERD  ,  Endo/Other  diabetes  Renal/GU negative Renal ROS     Musculoskeletal  (+) Fibromyalgia -  Abdominal   Peds  Hematology   Anesthesia Other Findings   Reproductive/Obstetrics                            Anesthesia Physical Anesthesia Plan  ASA: III  Anesthesia Plan: General   Post-op Pain Management:    Induction: Intravenous  Airway Management Planned: LMA  Additional Equipment:   Intra-op Plan:   Post-operative Plan: Extubation in OR  Informed Consent: I have reviewed the patients History and Physical, chart, labs and discussed the procedure including the risks, benefits and alternatives for the proposed anesthesia with the patient or authorized representative who has indicated his/her understanding and acceptance.   Dental advisory given  Plan Discussed with: CRNA and Anesthesiologist  Anesthesia Plan Comments:         Anesthesia Quick Evaluation

## 2016-07-18 NOTE — Op Note (Signed)
Operative Report- KNEE ARTHROSCOPY  Preoperative diagnosis-  Left knee medial meniscal tear  Postoperative diagnosis Left- knee medial meniscal tear plus  Chondral defect medial femoral condyle  Procedure- Left knee arthroscopy with medial  meniscal debridement and chondroplasty   Surgeon- Dione Plover. Derrik Mceachern, MD  Anesthesia-General  EBL-  Minimal  Complications- None  Condition- PACU - hemodynamically stable.  Brief clinical note- -Erica Drake is a 81 y.o.  female with a several month history of Left pain and mechanical symptoms. Exam and history suggested medial  meniscal tear confirmed by MRI. The patient presents now for arthroscopy and debridement   Procedure in detail -       After successful administration of General anesthetic, a tourmiquet is placed high on the Left  thigh and the Left lower extremity is prepped and draped in the usual sterile fashion. Time out is performed by the surgical team. Standard superomedial and inferolateral portal sites are marked and incisions made with an 11 blade. The inflow cannula is passed through the superomedial portal and camera through the inferolateral portal and inflow is initiated. Arthroscopic visualization proceeds.      The undersurface of the patella and trochlea are visualized and there is mild chondromalacia but no unstable cartilage defect. The medial and lateral gutters are visualized and there are  no loose bodies. Flexion and valgus force is applied to the knee and the medial compartment is entered. A spinal needle is passed into the joint through the site marked for the inferomedial portal. A small incision is made and the dilator passed into the joint. The findings for the medial compartment are unstable tear posterior horn medial meniscus with 1 x 1 cm chondral defect medial femoral condyle . The tear is debrided to a stable base with baskets and a shaver and sealed off with the Arthrocare. The shaver is used to debride the  unstable cartilage to a stable bony base with stable edges. It is then abraded with the shaver. It is probed and found to be stable.    The intercondylar notch is visualized and the ACL appears normal . The lateral compartment is entered and the findings are normal .      The joint is again inspected and there are no other tears, defects or loose bodies identified. The arthroscopic equipment is then removed from the inferior portals which are closed with interrupted 4-0 nylon. 20 ml of .25% Marcaine with epinephrine are injected through the inflow cannula and the cannula is then removed and the portal closed with nylon. The incisions are cleaned and dried and a bulky sterile dressing is applied. The patient is then awakened and transported to recovery in stable condition.   07/18/2016, 4:57 PM

## 2016-07-18 NOTE — Transfer of Care (Signed)
Immediate Anesthesia Transfer of Care Note  Patient: Erica Drake  Procedure(s) Performed: Procedure(s): LEFT KNEE ARTHROSCOPY WITH MEDIAL MENISECTOMY dedribement and chrodralplasty (Left)  Patient Location: PACU  Anesthesia Type:General  Level of Consciousness:  sedated, patient cooperative and responds to stimulation  Airway & Oxygen Therapy:Patient Spontanous Breathing and Patient connected to face mask oxgen  Post-op Assessment:  Report given to PACU RN and Post -op Vital signs reviewed and stable  Post vital signs:  Reviewed and stable  Last Vitals:  Vitals:   07/18/16 1501 07/18/16 1703  BP: (!) 157/74   Pulse: 85 (P) 79  Resp: 18 (P) 14  Temp: 37.2 C (P) 99.1 C    Complications: No apparent anesthesia complications

## 2016-07-18 NOTE — Discharge Instructions (Signed)
° °Dr. Boston Cookson °[facility] Specialist °[facility] °[address] °, [place] [address] °[phone] ° ° °Arthroscopic Procedure, Knee °An arthroscopic procedure can find what is wrong with your knee. °PROCEDURE °Arthroscopy is a surgical technique that allows your orthopedic surgeon to diagnose and treat your knee injury with accuracy. They will look into your knee through a small instrument. This is almost like a small (pencil sized) telescope. Because arthroscopy affects your knee less than open knee surgery, you can anticipate a more rapid recovery. Taking an active role by following your caregiver's instructions will help with rapid and complete recovery. Use crutches, rest, elevation, ice, and knee exercises as instructed. The length of recovery depends on various factors including type of injury, age, physical condition, medical conditions, and your rehabilitation. °Your knee is the joint between the large bones (femur and tibia) in your leg. Cartilage covers these bone ends which are smooth and slippery and allow your knee to bend and move smoothly. Two menisci, thick, semi-lunar shaped pads of cartilage which form a rim inside the joint, help absorb shock and stabilize your knee. Ligaments bind the bones together and support your knee joint. Muscles move the joint, help support your knee, and take stress off the joint itself. Because of this all programs and physical therapy to rehabilitate an injured or repaired knee require rebuilding and strengthening your muscles. °AFTER THE PROCEDURE °· After the procedure, you will be moved to a recovery area until most of the effects of the medication have worn off. Your caregiver will discuss the test results with you.  °· Only take over-the-counter or prescription medicines for pain, discomfort, or fever as directed by your caregiver.  °SEEK MEDICAL CARE IF:  °· You have increased bleeding from your wounds.  °· You see  redness, swelling, or have increasing pain in your wounds.  °· You have pus coming from your wound.  °· You have an oral temperature above 102° F (38.9° C).  °· You notice a bad smell coming from the wound or dressing.  °· You have severe pain with any motion of your knee.  °SEEK IMMEDIATE MEDICAL CARE IF:  °· You develop a rash.  °· You have difficulty breathing.  °· You have any allergic problems.  °FURTHER INSTRUCTIONS:  °· ICE to the affected knee every three hours for 30 minutes at a time and then as needed for pain and swelling.  Continue to use ice on the knee for pain and swelling from surgery. You may notice swelling that will progress down to the foot and ankle.  This is normal after surgery.  Elevate the leg when you are not up walking on it.   ° °DIET °You may resume your previous home diet once your are discharged from the hospital. ° °DRESSING / WOUND CARE / SHOWERING °You may start showering two days after being discharged home but do not submerge the incisions under water.  °Change dressing 48 hours after the procedure and then cover the small incisions with band aids until your follow up visit. °Change the surgical dressings daily and reapply a dry dressing each time.  ° °ACTIVITY °Walk with your walker as instructed. °Use walker as long as suggested by your caregivers. °Avoid periods of inactivity such as sitting longer than an hour when not asleep. This helps prevent blood clots.  °You may resume a sexual relationship in one month or when given the OK by your doctor.  °You may return to   work once you are cleared by your doctor.  °Do not drive a car for 6 weeks or until released by you surgeon.  °Do not drive while taking narcotics. ° °WEIGHT BEARING °Weight bearing as tolerated with assist device (walker, cane, etc) as directed, use it as long as suggested by your surgeon or therapist, typically at least 4-6 weeks. ° °POSTOPERATIVE CONSTIPATION PROTOCOL °Constipation - defined medically as fewer  than three stools per week and severe constipation as less than one stool per week. ° °One of the most common issues patients have following surgery is constipation.  Even if you have a regular bowel pattern at home, your normal regimen is likely to be disrupted due to multiple reasons following surgery.  Combination of anesthesia, postoperative narcotics, change in appetite and fluid intake all can affect your bowels.  In order to avoid complications following surgery, here are some recommendations in order to help you during your recovery period. ° °Colace (docusate) - Pick up an over-the-counter form of Colace or another stool softener and take twice a day as long as you are requiring postoperative pain medications.  Take with a full glass of water daily.  If you experience loose stools or diarrhea, hold the colace until you stool forms back up.  If your symptoms do not get better within 1 week or if they get worse, check with your doctor. ° °Dulcolax (bisacodyl) - Pick up over-the-counter and take as directed by the product packaging as needed to assist with the movement of your bowels.  Take with a full glass of water.  Use this product as needed if not relieved by Colace only.  ° °MiraLax (polyethylene glycol) - Pick up over-the-counter to have on hand.  MiraLax is a solution that will increase the amount of water in your bowels to assist with bowel movements.  Take as directed and can mix with a glass of water, juice, soda, coffee, or tea.  Take if you go more than two days without a movement. °Do not use MiraLax more than once per day. Call your doctor if you are still constipated or irregular after using this medication for 7 days in a row. ° °If you continue to have problems with postoperative constipation, please contact the office for further assistance and recommendations.  If you experience "the worst abdominal pain ever" or develop nausea or vomiting, please contact the office immediatly for further  recommendations for treatment. ° °ITCHING ° If you experience itching with your medications, try taking only a single pain pill, or even half a pain pill at a time.  You can also use Benadryl over the counter for itching or also to help with sleep.  ° °TED HOSE STOCKINGS °Wear the elastic stockings on both legs for three weeks following surgery during the day but you may remove then at night for sleeping. ° °MEDICATIONS °See your medication summary on the “After Visit Summary” that the nursing staff will review with you prior to discharge.  You may have some home medications which will be placed on hold until you complete the course of blood thinner medication.  It is important for you to complete the blood thinner medication as prescribed by your surgeon.  Continue your approved medications as instructed at time of discharge. °Do not drive while taking narcotics.  ° °PRECAUTIONS °If you experience chest pain or shortness of breath - call 911 immediately for transfer to the hospital emergency department.  °If you develop a fever greater that 101   F, purulent drainage from wound, increased redness or drainage from wound, foul odor from the wound/dressing, or calf pain - CONTACT YOUR SURGEON.   °                                                °FOLLOW-UP APPOINTMENTS °Make sure you keep all of your appointments after your operation with your surgeon and caregivers. You should call the office at [phone]  and make an appointment for approximately one week after the date of your surgery or on the date instructed by your surgeon outlined in the "After Visit Summary". ° °RANGE OF MOTION AND STRENGTHENING EXERCISES  °Rehabilitation of the knee is important following a knee injury or an operation. After just a few days of immobilization, the muscles of the thigh which control the knee become weakened and shrink (atrophy). Knee exercises are designed to build up the tone and strength of the thigh muscles and to improve  knee motion. Often times heat used for twenty to thirty minutes before working out will loosen up your tissues and help with improving the range of motion but do not use heat for the first two weeks following surgery. These exercises can be done on a training (exercise) mat, on the floor, on a table or on a bed. Use what ever works the best and is most comfortable for you Knee exercises include: ° °QUAD STRENGTHENING EXERCISES °Strengthening Quadriceps Sets ° °Tighten muscles on top of thigh by pushing knees down into floor or table. °Hold for 20 seconds. Repeat 10 times. °Do 2 sessions per day. ° ° ° ° °Strengthening Terminal Knee Extension ° °With knee bent over bolster, straighten knee by tightening muscle on top of thigh. Be sure to keep bottom of knee on bolster. °Hold for 20 seconds. Repeat 10 times. °Do 2 sessions per day. ° ° °Straight Leg with Bent Knee ° °Lie on back with opposite leg bent. Keep involved knee slightly bent at knee and raise leg 4-6". Hold for 10 seconds. °Repeat 20 times per set. °Do 2 sets per session. °Do 2 sessions per day. ° °

## 2016-07-18 NOTE — Anesthesia Postprocedure Evaluation (Signed)
Anesthesia Post Note  Patient: Erica Drake  Procedure(s) Performed: Procedure(s) (LRB): LEFT KNEE ARTHROSCOPY WITH MEDIAL MENISECTOMY dedribement and chrodralplasty (Left)  Patient location during evaluation: PACU Anesthesia Type: General Level of consciousness: awake Pain management: pain level controlled Vital Signs Assessment: post-procedure vital signs reviewed and stable Respiratory status: spontaneous breathing Cardiovascular status: stable Anesthetic complications: no       Last Vitals:  Vitals:   07/18/16 1730 07/18/16 1745  BP: 140/65 (!) 148/62  Pulse: 66 72  Resp: 15 16  Temp:      Last Pain:  Vitals:   07/18/16 1745  TempSrc:   PainSc: 9                  Ary Lavine

## 2016-07-19 ENCOUNTER — Encounter (HOSPITAL_COMMUNITY): Payer: Self-pay | Admitting: Orthopedic Surgery

## 2016-07-19 DIAGNOSIS — R5382 Chronic fatigue, unspecified: Secondary | ICD-10-CM | POA: Diagnosis not present

## 2016-07-19 DIAGNOSIS — G7000 Myasthenia gravis without (acute) exacerbation: Secondary | ICD-10-CM | POA: Diagnosis not present

## 2016-07-19 DIAGNOSIS — E119 Type 2 diabetes mellitus without complications: Secondary | ICD-10-CM | POA: Diagnosis not present

## 2016-07-19 DIAGNOSIS — K219 Gastro-esophageal reflux disease without esophagitis: Secondary | ICD-10-CM | POA: Diagnosis not present

## 2016-07-19 DIAGNOSIS — S83242A Other tear of medial meniscus, current injury, left knee, initial encounter: Secondary | ICD-10-CM | POA: Diagnosis not present

## 2016-07-19 DIAGNOSIS — M797 Fibromyalgia: Secondary | ICD-10-CM | POA: Diagnosis not present

## 2016-07-19 NOTE — Progress Notes (Signed)
.  Subjective: 1 Day Post-Op Procedure(s) (LRB): LEFT KNEE ARTHROSCOPY WITH MEDIAL MENISECTOMY dedribement and chrodralplasty (Left) Patient reports pain as mild.   No respiratory issues last night  Objective: Vital signs in last 24 hours: Temp:  [97.4 F (36.3 C)-98.9 F (37.2 C)] 97.8 F (36.6 C) (05/24 0454) Pulse Rate:  [66-85] 77 (05/24 0454) Resp:  [12-26] 16 (05/24 0454) BP: (124-162)/(56-80) 137/58 (05/24 0454) SpO2:  [95 %-100 %] 95 % (05/24 0454) Weight:  [68.5 kg (151 lb)] 68.5 kg (151 lb) (05/23 1520)  Intake/Output from previous day: 05/23 0701 - 05/24 0700 In: 2762.5 [P.O.:600; I.V.:2162.5] Out: 910 [Urine:900; Blood:10] Intake/Output this shift: No intake/output data recorded.  No results for input(s): HGB in the last 72 hours. No results for input(s): WBC, RBC, HCT, PLT in the last 72 hours. No results for input(s): NA, K, CL, CO2, BUN, CREATININE, GLUCOSE, CALCIUM in the last 72 hours. No results for input(s): LABPT, INR in the last 72 hours.  Neurologically intact Neurovascular intact Dorsiflexion/Plantar flexion intact No cellulitis present Compartment soft  Assessment/Plan: 1 Day Post-Op Procedure(s) (LRB): LEFT KNEE ARTHROSCOPY WITH MEDIAL MENISECTOMY dedribement and chrodralplasty (Left) Advance diet D/C IV fluids  Discharge home  K[place] V 07/19/2016, 7:26 AM

## 2016-07-19 NOTE — Progress Notes (Signed)
Discharge planning, no HH needs identified. [phone] 

## 2016-07-24 DIAGNOSIS — M23322 Other meniscus derangements, posterior horn of medial meniscus, left knee: Secondary | ICD-10-CM | POA: Diagnosis not present

## 2016-07-24 DIAGNOSIS — Z4789 Encounter for other orthopedic aftercare: Secondary | ICD-10-CM | POA: Diagnosis not present

## 2016-07-25 ENCOUNTER — Ambulatory Visit: Payer: Medicare Other | Admitting: Neurology

## 2016-07-26 DIAGNOSIS — N3010 Interstitial cystitis (chronic) without hematuria: Secondary | ICD-10-CM | POA: Diagnosis not present

## 2016-07-26 DIAGNOSIS — R300 Dysuria: Secondary | ICD-10-CM | POA: Diagnosis not present

## 2016-08-01 DIAGNOSIS — Z4789 Encounter for other orthopedic aftercare: Secondary | ICD-10-CM | POA: Diagnosis not present

## 2016-08-01 DIAGNOSIS — M170 Bilateral primary osteoarthritis of knee: Secondary | ICD-10-CM | POA: Diagnosis not present

## 2016-08-06 NOTE — Discharge Summary (Signed)
Physician Discharge Summary   Patient ID: Erica Drake MRN: [id number] DOB/AGE: May 09, 1935 81 y.o.  Admit date: 07/18/2016 Discharge date:  07/19/2016 Primary Diagnosis:   Left knee medial meniscal tear  Admission Diagnoses:  Past Medical History:  Diagnosis Date  . Allergic rhinitis   . Chronic bronchitis    some mild wheezing at change of weather-  . Chronic fatigue   . Complication of anesthesia    pt reports she always has to be admitted after surgery with pulmon. consult  d/t respiratory history  . Decreased hearing    intol hearing aids. Deaf left ear, hearing aid right ear.  . Diabetes mellitus without complication ([place])   . Eustachian tube dysfunction    decreased hearing  . Fibromyalgia   . GERD (gastroesophageal reflux disease)    [place]  . Hx of myasthenia gravis   . Interstitial cystitis    [facility]-Dr. Karsten Ro sees periodically.(occ. self caths at home)  . Vertigo    Discharge Diagnoses:   Principal Problem:   Acute medial meniscal tear Active Problems:   Acute medial meniscal tear, left, subsequent encounter  Procedure:  Procedure(s) (LRB): LEFT KNEE ARTHROSCOPY WITH MEDIAL MENISECTOMY dedribement and chrodralplasty (Left)   Consults: None  HPI: Erica Drake is a 81 y.o.  female with a several month history of Left pain and mechanical symptoms. Exam and history suggested medial  meniscal tear confirmed by MRI. The patient presents now for arthroscopy and debridement     Laboratory Data: Hospital Outpatient Visit on 07/11/2016  Component Date Value Ref Range Status  . Hgb A1c MFr Bld 07/11/2016 5.9* 4.8 - 5.6 % Final   Comment: (NOTE)         Pre-diabetes: 5.7 - 6.4         Diabetes: >6.4         Glycemic control for adults with diabetes: <7.0   . Mean Plasma Glucose 07/11/2016 123  mg/dL Final   Comment: (NOTE) Performed At: [facility] [address], [place] [id number] Lindon Romp MD [id number]   .  Sodium 07/11/2016 140  135 - 145 mmol/L Final  . Potassium 07/11/2016 4.1  3.5 - 5.1 mmol/L Final  . Chloride 07/11/2016 105  101 - 111 mmol/L Final  . CO2 07/11/2016 30  22 - 32 mmol/L Final  . Glucose, Bld 07/11/2016 110* 65 - 99 mg/dL Final  . BUN 07/11/2016 18  6 - 20 mg/dL Final  . Creatinine, Ser 07/11/2016 1.36* 0.44 - 1.00 mg/dL Final  . Calcium 07/11/2016 8.7* 8.9 - 10.3 mg/dL Final  . GFR calc non Af Amer 07/11/2016 36* >60 mL/min Final  . GFR calc Af Amer 07/11/2016 42* >60 mL/min Final   Comment: (NOTE) The eGFR has been calculated using the CKD EPI equation. This calculation has not been validated in all clinical situations. eGFR's persistently <60 mL/min signify possible Chronic Kidney Disease.   . Anion gap 07/11/2016 5  5 - 15 Final  . WBC 07/11/2016 6.2  4.0 - 10.5 K/uL Final  . RBC 07/11/2016 4.62  3.87 - 5.11 MIL/uL Final  . Hemoglobin 07/11/2016 13.3  12.0 - 15.0 g/dL Final  . HCT 07/11/2016 41.5  36.0 - 46.0 % Final  . MCV 07/11/2016 89.8  78.0 - 100.0 fL Final  . MCH 07/11/2016 28.8  26.0 - 34.0 pg Final  . MCHC 07/11/2016 32.0  30.0 - 36.0 g/dL Final  . RDW 07/11/2016 15.4  11.5 - 15.5 %  Final  . Platelets 07/11/2016 341  150 - 400 K/uL Final  . MRSA, PCR 07/11/2016 NEGATIVE  NEGATIVE Final  . Staphylococcus aureus 07/11/2016 NEGATIVE  NEGATIVE Final   Comment:        The Xpert SA Assay (FDA approved for NASAL specimens in patients over 71 years of age), is one component of a comprehensive surveillance program.  Test performance has been validated by [facility] for patients greater than or equal to 30 year old. It is not intended to diagnose infection nor to guide or monitor treatment.    No results for input(s): HGB in the last 72 hours. No results for input(s): WBC, RBC, HCT, PLT in the last 72 hours. No results for input(s): NA, K, CL, CO2, BUN, CREATININE, GLUCOSE, CALCIUM in the last 72 hours. No results for input(s): LABPT, INR in the last 72  hours.  X-Rays:No results found.  EKG: Orders placed or performed during the hospital encounter of 12/01/15  . ED EKG  . ED EKG  . EKG 12-Lead  . EKG 12-Lead  . EKG     Hospital Course: Patient was admitted to [facility] and taken to the OR and underwent the above state procedure without complications.  Patient tolerated the procedure well and was later transferred to the recovery room and then to the orthopaedic floor for postoperative care.  They were given PO and IV analgesics for pain control following their surgery.  They were given 24 hours of postoperative antibiotics.   PT was consulted postop to assist with mobility and transfers.  The patient was allowed to be WBAT. Discharge planning was consulted to help with postop disposition and equipment needs.  Patient had a good night on the evening of surgery and started to get up OOB with therapy on day one. Patient was seen in rounds and was ready to go home on day one.  They were given discharge instructions and dressing directions.   Diet: Diabetic diet Activity:WBAT Follow-up:in 1 weeks Disposition - Home Discharged Condition: good   Discharge Instructions    Call MD / Call 911    Complete by:  As directed    If you experience chest pain or shortness of breath, CALL 911 and be transported to the hospital emergency room.  If you develope a fever above 101 F, pus (white drainage) or increased drainage or redness at the wound, or calf pain, call your surgeon's office.   Discharge instructions    Complete by:  As directed    You may remove your large bandage and shower in 2 days  Take an 81 mg Aspirin daily for the next 4 weeks starting tomorrow   Increase activity slowly as tolerated    Complete by:  As directed      Allergies as of 07/19/2016      Reactions   Cortisone Shortness Of Breath, Swelling, Other (See Comments)   Tongue swelling Tongue swelling   Amlodipine Besylate Other (See Comments)   cough    Chlorhexidine Gluconate Itching   Skin on face turned red Skin on face turned red   Clindamycin Itching   Codeine Other (See Comments)   Dexamethasone Other (See Comments)   Duloxetine Other (See Comments)   Morphine Sulfate Other (See Comments)   Niacin Other (See Comments)   Other Other (See Comments)   Pollen   Prednisone Other (See Comments)   Causes stomach pain. Sores in her mouth. Causes stomach pain. Sores in her mouth.  Rosuvastatin Calcium Other (See Comments)   Muscle aches   Statins Other (See Comments)   Muscle aches   Strawberry Extract Other (See Comments)   Sulfa Antibiotics Other (See Comments)   Valsartan Other (See Comments)      Medication List    TAKE these medications   buPROPion 200 MG 12 hr tablet Commonly known as:  WELLBUTRIN SR Take 200 mg by mouth 2 (two) times daily.   clonazePAM 1 MG tablet Commonly known as:  KLONOPIN Take 1 tablet by mouth 2 (two) times daily.   DEXILANT 60 MG capsule Generic drug:  dexlansoprazole Take 60 mg by mouth daily.   HYDROcodone-acetaminophen 5-325 MG tablet Commonly known as:  NORCO Take 1-2 tablets by mouth every 4 (four) hours as needed for moderate pain.   methocarbamol 500 MG tablet Commonly known as:  ROBAXIN Take 1 tablet (500 mg total) by mouth 4 (four) times daily. As needed for muscle spasm   ondansetron 4 MG tablet Commonly known as:  ZOFRAN Take 1 tablet (4 mg total) by mouth every 8 (eight) hours as needed for nausea or vomiting.   PROAIR HFA 108 (90 Base) MCG/ACT inhaler Generic drug:  albuterol Inhale 1-2 puffs into the lungs every 4 (four) hours as needed for wheezing or shortness of breath.   albuterol (2.5 MG/3ML) 0.083% nebulizer solution Commonly known as:  PROVENTIL Take 3 mLs (2.5 mg total) by nebulization every 6 (six) hours as needed for wheezing or shortness of breath.   pyridostigmine 60 MG tablet Commonly known as:  MESTINON Take 1 tablet (60 mg total) by mouth 3 (three)  times daily.   umeclidinium-vilanterol 62.5-25 MCG/INH Aepb Commonly known as:  ANORO ELLIPTA Inhale 1 puff into the lungs daily.      Follow-up Information    Gaynelle Arabian, MD. Schedule an appointment as soon as possible for a visit on 07/31/2016.   Specialty:  Orthopedic Surgery Why:  Call [phone] tomorrow to make the appointment Contact information: [address] [place] [address] [phone]           Signed: Arlee Muslim, PA-C Orthopaedic Surgery 08/06/2016, 7:44 AM

## 2016-08-14 DIAGNOSIS — G4709 Other insomnia: Secondary | ICD-10-CM | POA: Diagnosis not present

## 2016-08-14 DIAGNOSIS — Z1389 Encounter for screening for other disorder: Secondary | ICD-10-CM | POA: Diagnosis not present

## 2016-08-14 DIAGNOSIS — Z4789 Encounter for other orthopedic aftercare: Secondary | ICD-10-CM | POA: Diagnosis not present

## 2016-08-14 DIAGNOSIS — M25561 Pain in right knee: Secondary | ICD-10-CM | POA: Diagnosis not present

## 2016-08-14 DIAGNOSIS — M797 Fibromyalgia: Secondary | ICD-10-CM | POA: Diagnosis not present

## 2016-08-14 DIAGNOSIS — K219 Gastro-esophageal reflux disease without esophagitis: Secondary | ICD-10-CM | POA: Diagnosis not present

## 2016-08-14 DIAGNOSIS — F3289 Other specified depressive episodes: Secondary | ICD-10-CM | POA: Diagnosis not present

## 2016-08-14 DIAGNOSIS — J3089 Other allergic rhinitis: Secondary | ICD-10-CM | POA: Diagnosis not present

## 2016-08-14 DIAGNOSIS — J42 Unspecified chronic bronchitis: Secondary | ICD-10-CM | POA: Diagnosis not present

## 2016-08-14 DIAGNOSIS — R42 Dizziness and giddiness: Secondary | ICD-10-CM | POA: Diagnosis not present

## 2016-08-14 DIAGNOSIS — I10 Essential (primary) hypertension: Secondary | ICD-10-CM | POA: Diagnosis not present

## 2016-08-14 DIAGNOSIS — N3010 Interstitial cystitis (chronic) without hematuria: Secondary | ICD-10-CM | POA: Diagnosis not present

## 2016-08-16 DIAGNOSIS — N952 Postmenopausal atrophic vaginitis: Secondary | ICD-10-CM

## 2016-08-19 DIAGNOSIS — M6281 Muscle weakness (generalized): Secondary | ICD-10-CM | POA: Diagnosis not present

## 2016-08-19 DIAGNOSIS — M25562 Pain in left knee: Secondary | ICD-10-CM | POA: Diagnosis not present

## 2016-08-19 DIAGNOSIS — Z9181 History of falling: Secondary | ICD-10-CM | POA: Diagnosis not present

## 2016-08-21 DIAGNOSIS — M25562 Pain in left knee: Secondary | ICD-10-CM | POA: Diagnosis not present

## 2016-08-21 DIAGNOSIS — M6281 Muscle weakness (generalized): Secondary | ICD-10-CM | POA: Diagnosis not present

## 2016-08-21 DIAGNOSIS — Z9181 History of falling: Secondary | ICD-10-CM | POA: Diagnosis not present

## 2016-08-21 DIAGNOSIS — N3010 Interstitial cystitis (chronic) without hematuria: Secondary | ICD-10-CM | POA: Diagnosis not present

## 2016-08-23 DIAGNOSIS — N3010 Interstitial cystitis (chronic) without hematuria: Secondary | ICD-10-CM | POA: Diagnosis not present

## 2016-08-28 DIAGNOSIS — M6281 Muscle weakness (generalized): Secondary | ICD-10-CM | POA: Diagnosis not present

## 2016-08-28 DIAGNOSIS — M25562 Pain in left knee: Secondary | ICD-10-CM | POA: Diagnosis not present

## 2016-08-28 DIAGNOSIS — Z9181 History of falling: Secondary | ICD-10-CM | POA: Diagnosis not present

## 2016-09-03 ENCOUNTER — Encounter: Payer: Self-pay | Admitting: Neurology

## 2016-09-03 ENCOUNTER — Ambulatory Visit (INDEPENDENT_AMBULATORY_CARE_PROVIDER_SITE_OTHER): Payer: Medicare Other | Admitting: Neurology

## 2016-09-03 VITALS — BP 120/80 | HR 98 | Ht 64.0 in | Wt 151.2 lb

## 2016-09-03 DIAGNOSIS — G7000 Myasthenia gravis without (acute) exacerbation: Secondary | ICD-10-CM

## 2016-09-03 MED ORDER — PYRIDOSTIGMINE BROMIDE 60 MG PO TABS: 60.0000 mg | ORAL_TABLET | Freq: Three times a day (TID) | ORAL | 3 refills | Status: DC

## 2016-09-03 NOTE — Progress Notes (Signed)
Follow-up Visit   Date: 09/03/16    Erica Drake MRN: [id number] DOB: September 01, 1935   Interim History: Erica Drake is a 81 y.o. right-handed Caucasian female with fibromyalgia, GERD, depression, left sensorineural deficit, and interstitial cystitis, type 2 diabetes mellitus returning to the clinic for follow-up of seropositive myasthenia gravis.  The patient was accompanied to the clinic by self.  History of present illness: Initial visit 09/29/2015:  In May 2017, she began experiencing double vision which are located vertical and diagonal to each other.  She has noticed mornings are much better, but as the day progresses, her double vision gets worsen.  She endorses mild changes to her speech.  No difficulty with swallowing or shortness of breath.  She also complains of blurry vision in her right eye. The double vision has interfered with her driving, so she stopped this 2 months ago. She has seen her ENT, PCP, ophthalmologist, and then saw Dr. Jaynee Eagles in June who ordered MRI/A head and orbit which was normal.  Her acetylcholine antibodies returned positive.  A prescription for mestinon 59m three times daily was ordered, but patient did not start it.  She has many drug intolerances/allergies, including cortisone, so medications were started at a low dose.   She had many other chronic medical problems including chronic pain related to fibromyalgia, arthritis, and interstitial cystitis.    UPDATE 11/02/2015:  There has been no change in her double vision, despite increasing mestinon to 625mTID and adding prednisone 1080maily.  She is tolerating both medications well.  Because of difficulty with depth perception, she has unfortunately suffered several falls.  She is still not driving.  She admits to being very stressed lately because she and her husband are moving into a retirement community in October.  UPDATE 12/28/2015:  She stopped taking her prednisone because she has a number of  other problems including diverticulosis and bronchitis, which she thought would be worsened by prednisone.. DMarland Kitchenspite stopping her prednisone, there has been no change in her double vision.  Today, she complains of left eye pain and blurry vision. She continues to have double vision which is improved with using an eye patch.  No other extra-ocular MG complaints.   UPDATE 04/18/2016:  [facility] and was recommended to have SFEM[facility], but she did not wish to return for testing.  Over the past two months, she has noticed that double vision is improved and now only occurs intermittently throughout the day which she attributes to mestinon 60m75mD.  She is not taking prednisone.  She no longer uses an eye patch.  She denies double vision in the morning or difficulty swallowing.  [place] is [place] in [place], whose health is doing well.  UPDATE 09/03/2016:  She is here for 3 month appointment.  Overall, she feels that double vision and ptosis is stable.  She does not need to wear her eye patch and is complaint with mestinon 60mg56m.  She sometimes has blurry vision at end gaze when driving, but does see double.  No new difficulty with speech, swallow, or limb weakness.  She had left knee surgery 6 weeks ago, so is recovering from this.  Her husband has not been doing well and had two hospitalizations over the past month.    Medications:  Current Outpatient Prescriptions on File Prior to Visit  Medication Sig Dispense Refill  . albuterol (PROAIR HFA) 108 (90 Base)  MCG/ACT inhaler Inhale 1-2 puffs into the lungs every 4 (four) hours as needed for wheezing or shortness of breath.     Marland Kitchen albuterol (PROVENTIL) (2.5 MG/3ML) 0.083% nebulizer solution Take 3 mLs (2.5 mg total) by nebulization every 6 (six) hours as needed for wheezing or shortness of breath. 25 vial 11  . buPROPion (WELLBUTRIN SR) 200 MG 12 hr tablet Take 200 mg by mouth 2 (two) times daily.     .  clonazePAM (KLONOPIN) 1 MG tablet Take 1 tablet by mouth 2 (two) times daily.  4  . DEXILANT 60 MG capsule Take 60 mg by mouth daily.  5  . HYDROcodone-acetaminophen (NORCO) 5-325 MG tablet Take 1-2 tablets by mouth every 4 (four) hours as needed for moderate pain. 30 tablet 0  . methocarbamol (ROBAXIN) 500 MG tablet Take 1 tablet (500 mg total) by mouth 4 (four) times daily. As needed for muscle spasm 30 tablet 1  . ondansetron (ZOFRAN) 4 MG tablet Take 1 tablet (4 mg total) by mouth every 8 (eight) hours as needed for nausea or vomiting. 20 tablet 0  . umeclidinium-vilanterol (ANORO ELLIPTA) 62.5-25 MCG/INH AEPB Inhale 1 puff into the lungs daily. 60 each 5   No current facility-administered medications on file prior to visit.     Allergies:  Allergies  Allergen Reactions  . Cortisone Shortness Of Breath, Swelling and Other (See Comments)    Tongue swelling Tongue swelling  . Amlodipine Besylate Other (See Comments)    cough  . Chlorhexidine Gluconate Itching    Skin on face turned red Skin on face turned red  . Clindamycin Itching  . Codeine Other (See Comments)  . Dexamethasone Other (See Comments)  . Duloxetine Other (See Comments)  . Morphine Sulfate Other (See Comments)  . Niacin Other (See Comments)  . Other Other (See Comments)    Pollen  . Prednisone Other (See Comments)    Causes stomach pain. Sores in her mouth. Causes stomach pain. Sores in her mouth.  . Rosuvastatin Calcium Other (See Comments)    Muscle aches  . Statins Other (See Comments)    Muscle aches  . Strawberry Extract Other (See Comments)  . Sulfa Antibiotics Other (See Comments)  . Valsartan Other (See Comments)    Review of Systems:  CONSTITUTIONAL: No fevers, chills, night sweats, or weight loss.  EYES: No visual changes or eye pain ENT: No hearing changes.  No history of nose bleeds.   RESPIRATORY: No cough, wheezing and shortness of breath.   CARDIOVASCULAR: Negative for chest pain, and  palpitations.   GI: Negative for abdominal discomfort, blood in stools or black stools.  No recent change in bowel habits.   GU:  No history of incontinence.   MUSCLOSKELETAL: No history of joint pain or swelling.  No myalgias.   SKIN: Negative for lesions, rash, and itching.   ENDOCRINE: Negative for cold or heat intolerance, polydipsia or goiter.   PSYCH:  + depression or anxiety symptoms.   NEURO: As Above.   Vital Signs:  BP 120/80   Pulse 98   Ht 5' 4" (1.626 m)   Wt 151 lb 4 oz (68.6 kg)   SpO2 95%   BMI 25.96 kg/m   Neurological Exam: MENTAL STATUS including orientation to time, place, person, recent and remote memory, attention span and concentration, language, and fund of knowledge is normal.  Speech is not dysarthric.  CRANIAL NERVES: No visual field defects. Pupils equal round and reactive to light.  There  is no opthalmoplegia on exam today. Facial muscles are 5/5.   Subtle left ptosis. Normal facial sensation.  Face is symmetric. Palate elevates symmetrically.  Tongue is midline.   MOTOR:  Motor strength is 5/5 in all extremities.    COORDINATION/GAIT:    Gait is antalgic (she had knee surgery 6 weeks ago)  Data: Labs 08/26/2015:  AChR binding (0.63) blocking (28) antibody, ESR 3, CRP 4.9  MRI/A head 09/09/2015: This MR angiogram of the intracerebral arteries shows the following: 1. Very minimal atherosclerotic change within the left posterior cerebral artery that is unlikely to be clinically significant. 2. There is a normal variant with the left posterior cerebral artery obtaining its flow from the anterior circulation. 3. No aneurysms are seen. 4. No new findings compared to the 11/25/2003 MRA.   MRI orbit wwo contrast 09/09/2015:  This is is a normal MRI of the orbits with and without contrast.  IMPRESSION/PLAN: Seropositive ocular myasthenia gravis (diagnosed June 2017) without exacerbation  - Clinically, doing much better with improved double  vision.  Exam with only left right ptosis.   - Continue mestinon 74m three times daily    Return to clinic in 6 months  The duration of this appointment visit was 20 minutes of face-to-face time with the patient.  Greater than 50% of this time was spent in counseling, explanation of diagnosis, planning of further management, and coordination of care.   Thank you for allowing me to participate in patient's care.  If I can answer any additional questions, I would be pleased to do so.    Sincerely,    Donika K. PPosey Pronto DO

## 2016-09-03 NOTE — Patient Instructions (Signed)
Great to see you!  Continue your medications as you are taking them  Return to clinic in 6 months

## 2016-09-04 DIAGNOSIS — N3010 Interstitial cystitis (chronic) without hematuria: Secondary | ICD-10-CM | POA: Diagnosis not present

## 2016-09-06 DIAGNOSIS — N3010 Interstitial cystitis (chronic) without hematuria: Secondary | ICD-10-CM | POA: Diagnosis not present

## 2016-09-11 DIAGNOSIS — M25562 Pain in left knee: Secondary | ICD-10-CM | POA: Diagnosis not present

## 2016-09-11 DIAGNOSIS — Z9181 History of falling: Secondary | ICD-10-CM | POA: Diagnosis not present

## 2016-09-11 DIAGNOSIS — M6281 Muscle weakness (generalized): Secondary | ICD-10-CM | POA: Diagnosis not present

## 2016-09-13 ENCOUNTER — Encounter: Payer: Self-pay | Admitting: Internal Medicine

## 2016-09-13 ENCOUNTER — Ambulatory Visit (INDEPENDENT_AMBULATORY_CARE_PROVIDER_SITE_OTHER): Payer: Medicare Other | Admitting: Internal Medicine

## 2016-09-13 VITALS — BP 116/76 | HR 82 | Ht 65.0 in | Wt 152.0 lb

## 2016-09-13 DIAGNOSIS — F5101 Primary insomnia: Secondary | ICD-10-CM | POA: Diagnosis not present

## 2016-09-13 DIAGNOSIS — J449 Chronic obstructive pulmonary disease, unspecified: Secondary | ICD-10-CM

## 2016-09-13 DIAGNOSIS — G4733 Obstructive sleep apnea (adult) (pediatric): Secondary | ICD-10-CM

## 2016-09-13 MED ORDER — FLUTICASONE-UMECLIDIN-VILANT 100-62.5-25 MCG/INH IN AEPB: 1.0000 | INHALATION_SPRAY | Freq: Every day | RESPIRATORY_TRACT | 0 refills | Status: DC

## 2016-09-13 NOTE — Progress Notes (Signed)
HPI female former smoker followed for COPD, Allergic rhinitis, chronic bronchitis, eustachian tube dysfunction, complicated by GERD, impaired hearing, fibromyalgia, insomnia, Myasthenia Gravis Office Spirometry 03/16/2016-severe obstructive airways disease FVC 1.59/59%, FEV1 0.98/49%, ratio 0.62, FEF 25-75% 0.42/28% a1AT 07/11/16- WNL 159 MM PFT 04/16/16-severe obstructive airways disease with slight response to bronchodilator, severe diffusion defect. FEV1/FVC 0.64, DLCO 45% --------------------------------------------------------------------------------------  03/16/2016- 81 year old female former smoker followed for Asthma, Allergic rhinitis, chronic bronchitis, eustachian tube dysfunction, complicated by GERD/ Nissen, impaired hearing, fibromyalgia, insomnia, Myasthenia Gravis FOLLOWS FOR: Coughing,yellow-browish,better but still chough up. something. wheezing,SOB,nasal drainage.Neur. Dr. wants pred. pt. Resisting. Patient actually says she feels the best that she has in quite a while. Has been dealing with bronchitis since October. Sounds like 2 or 3 separate infections rub than 1 constant illness. She is hoping to stay off of prednisone because of side effects, although her neurologist wants her on it for her myasthenia. She wheezes some most days. Uses nebulizer and rescue inhaler as needed. Averages 2 or 3 times per week with a rescue inhaler. Reflux symptoms well controlled with past history of surgical repair. Office Spirometry 03/16/2016-severe obstructive airways disease FVC 1.59/59%, FEV1 0.98/49%, ratio 0.62, FEF 25-75% 0.42/28%  09/13/16- 81 year old female former smoker followed for COPD, Allergic rhinitis, chronic bronchitis, eustachian tube dysfunction, complicated by GERD/ Nissen, impaired hearing, fibromyalgia, insomnia, Myasthenia Gravis FOLLOWS FOR: Pt states she has good and bad days-with bad days she is having SOB and wheezing. Pt has questions regarding a BiPAP machine as well.   Hospitalized in May for arthroscopy left knee-torn meniscus Remote history of Barrett's esophagus/fundoplasty Mouth breathing wakes her at night Husband uses CPAP so she is familiar but he has complex health problems and she sleeps alone. Her hearing is bad and she cannot hear if she wheezes. Continues regular use of an oral, uses rescue inhaler less than once a day. Has nebulizer if needed. PFT 04/16/16-severe obstructive airways disease with slight response to bronchodilator, severe diffusion defect. FEV1/FVC 0.64, DLCO 45% CXR 03/16/16- IMPRESSION: No infiltrate or pulmonary edema. Mild perihilar bronchitic changes with slight worsening from prior exam.  ROS-see HPI   Negative unless "+" Constitutional:    weight loss, night sweats, fevers, chills, fatigue, lassitude. HEENT:    headaches, difficulty swallowing, +tooth/dental problems, sore throat,       sneezing, itching, ear ache, nasal congestion, post nasal drip, snoring CV:    chest pain, orthopnea, PND, swelling in lower extremities, anasarca,                                                        dizziness, +palpitations Resp:   shortness of breath with exertion or at rest.               + productive cough,   non-productive cough, coughing up of blood.              change in color of mucus.  + wheezing.   Skin:    rash or lesions. GI: +heartburn, +indigestion, abdominal pain, nausea, vomiting, diarrhea,                 change in bowel habits, loss of appetite GU: dysuria, change in color of urine, no urgency or frequency.   flank pain. MS:   +joint pain, stiffness, decreased range of motion, back  pain. Neuro-     +diplopia Psych:  change in mood or affect.  depression or +anxiety.   memory loss.  OBJ- Physical Exam General- Alert, Oriented, Affect-appropriate, Distress- none acute Skin- rash-none, lesions- none, excoriation- none Lymphadenopathy- none Head- atraumatic            Eyes- + patch over left eye diplopia             Ears- +HOH            Nose- Clear, no-Septal dev, mucus, polyps, erosion, perforation             Throat- Mallampati III , mucosa clear , drainage- none, tonsils- atrophic Neck- flexible , trachea midline, no stridor , thyroid nl, carotid no bruit Chest - symmetrical excursion , unlabored           Heart/CV- RRR , no murmur , no gallop  , no rub, nl s1 s2                           - JVD- none , edema- none, stasis changes- none, varices- none           Lung-  wheeze -none , dullness-none, rub- none, aside diminished/unlabored            Abd-  Br/ Gen/ Rectal- Not done, not indicated Extrem- cyanosis- none, clubbing, none, atrophy- none, strength- nl, + elastic hose left leg Neuro- grossly intact to observation

## 2016-09-13 NOTE — Patient Instructions (Signed)
Order- schedule unattended HST      Dx OSA  Sample x 2 Trelegy  Ellipta    Inhale 1 puff, then rinse mouth, once daily    Try this instead of Anoro. When you run out of the samples, go back to Anoro for comparison.   Please call as needed

## 2016-09-16 NOTE — Assessment & Plan Note (Addendum)
Severe obstructive airways disease. She can't hear herself wheeze. Plan-try samples of Trelegy instead of Anoro for comparison

## 2016-09-16 NOTE — Assessment & Plan Note (Signed)
I'm concerned that some of her sleep problems may reflect obstructive sleep apnea based on her palate and which she can tell me about her sleep patterns without a witness. We also want to know about oxygenation during sleep. Plan-schedule sleep study

## 2016-09-17 DIAGNOSIS — G7000 Myasthenia gravis without (acute) exacerbation: Secondary | ICD-10-CM | POA: Diagnosis not present

## 2016-09-17 DIAGNOSIS — K5733 Diverticulitis of large intestine without perforation or abscess with bleeding: Secondary | ICD-10-CM | POA: Diagnosis not present

## 2016-09-17 DIAGNOSIS — N3011 Interstitial cystitis (chronic) with hematuria: Secondary | ICD-10-CM | POA: Diagnosis not present

## 2016-09-17 DIAGNOSIS — R109 Unspecified abdominal pain: Secondary | ICD-10-CM | POA: Diagnosis not present

## 2016-09-17 DIAGNOSIS — M797 Fibromyalgia: Secondary | ICD-10-CM | POA: Diagnosis not present

## 2016-09-17 DIAGNOSIS — K219 Gastro-esophageal reflux disease without esophagitis: Secondary | ICD-10-CM | POA: Diagnosis not present

## 2016-09-17 DIAGNOSIS — E119 Type 2 diabetes mellitus without complications: Secondary | ICD-10-CM | POA: Diagnosis not present

## 2016-09-18 DIAGNOSIS — M23322 Other meniscus derangements, posterior horn of medial meniscus, left knee: Secondary | ICD-10-CM | POA: Diagnosis not present

## 2016-09-20 DIAGNOSIS — M25562 Pain in left knee: Secondary | ICD-10-CM | POA: Diagnosis not present

## 2016-09-20 DIAGNOSIS — Z9181 History of falling: Secondary | ICD-10-CM | POA: Diagnosis not present

## 2016-09-20 DIAGNOSIS — M6281 Muscle weakness (generalized): Secondary | ICD-10-CM | POA: Diagnosis not present

## 2016-09-20 DIAGNOSIS — G4733 Obstructive sleep apnea (adult) (pediatric): Secondary | ICD-10-CM | POA: Diagnosis not present

## 2016-09-20 DIAGNOSIS — N3010 Interstitial cystitis (chronic) without hematuria: Secondary | ICD-10-CM | POA: Diagnosis not present

## 2016-09-24 ENCOUNTER — Other Ambulatory Visit: Payer: Self-pay | Admitting: *Deleted

## 2016-09-24 DIAGNOSIS — G4733 Obstructive sleep apnea (adult) (pediatric): Secondary | ICD-10-CM | POA: Diagnosis not present

## 2016-09-25 DIAGNOSIS — N3010 Interstitial cystitis (chronic) without hematuria: Secondary | ICD-10-CM | POA: Diagnosis not present

## 2016-09-27 DIAGNOSIS — N3010 Interstitial cystitis (chronic) without hematuria: Secondary | ICD-10-CM | POA: Diagnosis not present

## 2016-09-28 DIAGNOSIS — M6281 Muscle weakness (generalized): Secondary | ICD-10-CM | POA: Diagnosis not present

## 2016-09-28 DIAGNOSIS — M25562 Pain in left knee: Secondary | ICD-10-CM | POA: Diagnosis not present

## 2016-09-28 DIAGNOSIS — Z9181 History of falling: Secondary | ICD-10-CM | POA: Diagnosis not present

## 2016-10-01 DIAGNOSIS — Z9181 History of falling: Secondary | ICD-10-CM | POA: Diagnosis not present

## 2016-10-01 DIAGNOSIS — M6281 Muscle weakness (generalized): Secondary | ICD-10-CM | POA: Diagnosis not present

## 2016-10-01 DIAGNOSIS — M25562 Pain in left knee: Secondary | ICD-10-CM | POA: Diagnosis not present

## 2016-10-02 DIAGNOSIS — N3010 Interstitial cystitis (chronic) without hematuria: Secondary | ICD-10-CM | POA: Diagnosis not present

## 2016-10-03 DIAGNOSIS — M6281 Muscle weakness (generalized): Secondary | ICD-10-CM | POA: Diagnosis not present

## 2016-10-03 DIAGNOSIS — M25562 Pain in left knee: Secondary | ICD-10-CM | POA: Diagnosis not present

## 2016-10-03 DIAGNOSIS — Z9181 History of falling: Secondary | ICD-10-CM | POA: Diagnosis not present

## 2016-10-04 DIAGNOSIS — N3010 Interstitial cystitis (chronic) without hematuria: Secondary | ICD-10-CM | POA: Diagnosis not present

## 2016-10-09 ENCOUNTER — Encounter: Payer: Self-pay | Admitting: Internal Medicine

## 2016-10-09 DIAGNOSIS — N3010 Interstitial cystitis (chronic) without hematuria: Secondary | ICD-10-CM | POA: Diagnosis not present

## 2016-10-09 DIAGNOSIS — G4733 Obstructive sleep apnea (adult) (pediatric): Secondary | ICD-10-CM

## 2016-10-09 NOTE — Telephone Encounter (Signed)
Dr Annamaria Boots please advise on HST results. Thanks.

## 2016-10-10 DIAGNOSIS — Z9181 History of falling: Secondary | ICD-10-CM | POA: Diagnosis not present

## 2016-10-10 DIAGNOSIS — M6281 Muscle weakness (generalized): Secondary | ICD-10-CM | POA: Diagnosis not present

## 2016-10-10 DIAGNOSIS — M25562 Pain in left knee: Secondary | ICD-10-CM | POA: Diagnosis not present

## 2016-10-10 NOTE — Telephone Encounter (Signed)
Her sleep study showed mild obstructive sleep apnea, with 7.6 apneas/ hour and a drop in oxygen saturation when she stops breathing. Because she also has COPD, I think the best treatment would be CPAP. Her husband is on CPAP.  Recommend new DME (same as husband), new CPAP auto 5-15, mask of choice, humidifier, supplies, AirView    Dx OSA                        Will need return ov sooner-  W/in 90 days

## 2016-10-11 DIAGNOSIS — R8290 Unspecified abnormal findings in urine: Secondary | ICD-10-CM | POA: Diagnosis not present

## 2016-10-11 DIAGNOSIS — N816 Rectocele: Secondary | ICD-10-CM | POA: Diagnosis not present

## 2016-10-11 DIAGNOSIS — N3010 Interstitial cystitis (chronic) without hematuria: Secondary | ICD-10-CM | POA: Diagnosis not present

## 2016-10-11 NOTE — Addendum Note (Signed)
Addended by: Virl Cagey on: 10/11/2016 09:41 AM   Modules accepted: Orders

## 2016-10-12 DIAGNOSIS — Z9181 History of falling: Secondary | ICD-10-CM | POA: Diagnosis not present

## 2016-10-12 DIAGNOSIS — M25562 Pain in left knee: Secondary | ICD-10-CM | POA: Diagnosis not present

## 2016-10-12 DIAGNOSIS — M6281 Muscle weakness (generalized): Secondary | ICD-10-CM | POA: Diagnosis not present

## 2016-10-16 DIAGNOSIS — Z4789 Encounter for other orthopedic aftercare: Secondary | ICD-10-CM | POA: Diagnosis not present

## 2016-10-16 DIAGNOSIS — N3010 Interstitial cystitis (chronic) without hematuria: Secondary | ICD-10-CM | POA: Diagnosis not present

## 2016-10-16 DIAGNOSIS — M170 Bilateral primary osteoarthritis of knee: Secondary | ICD-10-CM | POA: Diagnosis not present

## 2016-10-16 DIAGNOSIS — M23322 Other meniscus derangements, posterior horn of medial meniscus, left knee: Secondary | ICD-10-CM | POA: Diagnosis not present

## 2016-10-17 DIAGNOSIS — M25562 Pain in left knee: Secondary | ICD-10-CM | POA: Diagnosis not present

## 2016-10-17 DIAGNOSIS — M6281 Muscle weakness (generalized): Secondary | ICD-10-CM | POA: Diagnosis not present

## 2016-10-17 DIAGNOSIS — Z9181 History of falling: Secondary | ICD-10-CM | POA: Diagnosis not present

## 2016-10-18 DIAGNOSIS — N3010 Interstitial cystitis (chronic) without hematuria: Secondary | ICD-10-CM | POA: Diagnosis not present

## 2016-10-23 DIAGNOSIS — N3010 Interstitial cystitis (chronic) without hematuria: Secondary | ICD-10-CM | POA: Diagnosis not present

## 2016-10-25 DIAGNOSIS — N3010 Interstitial cystitis (chronic) without hematuria: Secondary | ICD-10-CM | POA: Diagnosis not present

## 2016-10-26 ENCOUNTER — Ambulatory Visit: Payer: Medicare Other | Admitting: Neurology

## 2016-11-02 DIAGNOSIS — N3010 Interstitial cystitis (chronic) without hematuria: Secondary | ICD-10-CM | POA: Diagnosis not present

## 2016-11-06 DIAGNOSIS — F341 Dysthymic disorder: Secondary | ICD-10-CM | POA: Diagnosis not present

## 2016-11-06 DIAGNOSIS — N3010 Interstitial cystitis (chronic) without hematuria: Secondary | ICD-10-CM | POA: Diagnosis not present

## 2016-11-07 DIAGNOSIS — Z85828 Personal history of other malignant neoplasm of skin: Secondary | ICD-10-CM | POA: Diagnosis not present

## 2016-11-07 DIAGNOSIS — L821 Other seborrheic keratosis: Secondary | ICD-10-CM | POA: Diagnosis not present

## 2016-11-07 DIAGNOSIS — L718 Other rosacea: Secondary | ICD-10-CM | POA: Diagnosis not present

## 2016-11-07 DIAGNOSIS — L814 Other melanin hyperpigmentation: Secondary | ICD-10-CM | POA: Diagnosis not present

## 2016-11-07 DIAGNOSIS — L820 Inflamed seborrheic keratosis: Secondary | ICD-10-CM | POA: Diagnosis not present

## 2016-11-07 DIAGNOSIS — D1801 Hemangioma of skin and subcutaneous tissue: Secondary | ICD-10-CM | POA: Diagnosis not present

## 2016-11-13 DIAGNOSIS — N3010 Interstitial cystitis (chronic) without hematuria: Secondary | ICD-10-CM | POA: Diagnosis not present

## 2016-11-20 DIAGNOSIS — N3010 Interstitial cystitis (chronic) without hematuria: Secondary | ICD-10-CM | POA: Diagnosis not present

## 2016-11-22 DIAGNOSIS — N3010 Interstitial cystitis (chronic) without hematuria: Secondary | ICD-10-CM | POA: Diagnosis not present

## 2016-11-26 DIAGNOSIS — N3010 Interstitial cystitis (chronic) without hematuria: Secondary | ICD-10-CM | POA: Diagnosis not present

## 2016-11-26 DIAGNOSIS — Z23 Encounter for immunization: Secondary | ICD-10-CM | POA: Diagnosis not present

## 2016-11-29 DIAGNOSIS — N3010 Interstitial cystitis (chronic) without hematuria: Secondary | ICD-10-CM | POA: Diagnosis not present

## 2016-11-29 DIAGNOSIS — M170 Bilateral primary osteoarthritis of knee: Secondary | ICD-10-CM | POA: Diagnosis not present

## 2016-12-04 DIAGNOSIS — N3010 Interstitial cystitis (chronic) without hematuria: Secondary | ICD-10-CM | POA: Diagnosis not present

## 2016-12-06 ENCOUNTER — Telehealth: Payer: Self-pay | Admitting: Neurology

## 2016-12-06 DIAGNOSIS — N3010 Interstitial cystitis (chronic) without hematuria: Secondary | ICD-10-CM | POA: Diagnosis not present

## 2016-12-06 NOTE — Telephone Encounter (Signed)
Left message for patient to call me back. 

## 2016-12-06 NOTE — Telephone Encounter (Signed)
Pt left a voicemail message asking for a call back about having problems holding her neck and wants to know what to do

## 2016-12-06 NOTE — Telephone Encounter (Signed)
Patient called with some questions regarding her Neck muscles and not being able to hold her head up in the afternoon as well as having trouble swallowing. She wanted to know if this was related to her Myasthenia Gravis. Please Advise. Thanks

## 2016-12-07 ENCOUNTER — Encounter (HOSPITAL_COMMUNITY): Payer: Self-pay | Admitting: Emergency Medicine

## 2016-12-07 ENCOUNTER — Inpatient Hospital Stay (HOSPITAL_COMMUNITY)
Admission: EM | Admit: 2016-12-07 | Discharge: 2016-12-12 | DRG: 057 | Disposition: A | Discharge status: Home / Self Care | Payer: Medicare Other | Attending: Family Medicine | Admitting: Family Medicine

## 2016-12-07 ENCOUNTER — Emergency Department (HOSPITAL_COMMUNITY): Payer: Medicare Other

## 2016-12-07 DIAGNOSIS — E1122 Type 2 diabetes mellitus with diabetic chronic kidney disease: Secondary | ICD-10-CM | POA: Diagnosis present

## 2016-12-07 DIAGNOSIS — Z87891 Personal history of nicotine dependence: Secondary | ICD-10-CM | POA: Diagnosis not present

## 2016-12-07 DIAGNOSIS — N179 Acute kidney failure, unspecified: Secondary | ICD-10-CM | POA: Diagnosis present

## 2016-12-07 DIAGNOSIS — Z886 Allergy status to analgesic agent status: Secondary | ICD-10-CM

## 2016-12-07 DIAGNOSIS — G7001 Myasthenia gravis with (acute) exacerbation: Principal | ICD-10-CM | POA: Diagnosis present

## 2016-12-07 DIAGNOSIS — J45909 Unspecified asthma, uncomplicated: Secondary | ICD-10-CM | POA: Diagnosis present

## 2016-12-07 DIAGNOSIS — Z885 Allergy status to narcotic agent status: Secondary | ICD-10-CM

## 2016-12-07 DIAGNOSIS — G7000 Myasthenia gravis without (acute) exacerbation: Secondary | ICD-10-CM | POA: Diagnosis not present

## 2016-12-07 DIAGNOSIS — J449 Chronic obstructive pulmonary disease, unspecified: Secondary | ICD-10-CM

## 2016-12-07 DIAGNOSIS — Z79899 Other long term (current) drug therapy: Secondary | ICD-10-CM

## 2016-12-07 DIAGNOSIS — R0602 Shortness of breath: Secondary | ICD-10-CM | POA: Diagnosis not present

## 2016-12-07 DIAGNOSIS — R1310 Dysphagia, unspecified: Secondary | ICD-10-CM | POA: Diagnosis present

## 2016-12-07 DIAGNOSIS — J309 Allergic rhinitis, unspecified: Secondary | ICD-10-CM | POA: Diagnosis present

## 2016-12-07 DIAGNOSIS — E860 Dehydration: Secondary | ICD-10-CM | POA: Diagnosis present

## 2016-12-07 DIAGNOSIS — N189 Chronic kidney disease, unspecified: Secondary | ICD-10-CM | POA: Diagnosis present

## 2016-12-07 DIAGNOSIS — Z888 Allergy status to other drugs, medicaments and biological substances status: Secondary | ICD-10-CM

## 2016-12-07 DIAGNOSIS — F329 Major depressive disorder, single episode, unspecified: Secondary | ICD-10-CM | POA: Diagnosis not present

## 2016-12-07 DIAGNOSIS — Z882 Allergy status to sulfonamides status: Secondary | ICD-10-CM

## 2016-12-07 DIAGNOSIS — J45902 Unspecified asthma with status asthmaticus: Secondary | ICD-10-CM | POA: Diagnosis not present

## 2016-12-07 DIAGNOSIS — J439 Emphysema, unspecified: Secondary | ICD-10-CM | POA: Diagnosis not present

## 2016-12-07 DIAGNOSIS — Z91018 Allergy to other foods: Secondary | ICD-10-CM

## 2016-12-07 DIAGNOSIS — Z881 Allergy status to other antibiotic agents status: Secondary | ICD-10-CM | POA: Diagnosis not present

## 2016-12-07 DIAGNOSIS — M797 Fibromyalgia: Secondary | ICD-10-CM | POA: Diagnosis present

## 2016-12-07 LAB — CBC WITH DIFFERENTIAL/PLATELET
BASOS ABS: 0 10*3/uL (ref 0.0–0.1)
BASOS PCT: 0 %
Eosinophils Absolute: 0.2 10*3/uL (ref 0.0–0.7)
Eosinophils Relative: 2 %
HEMATOCRIT: 41.9 % (ref 36.0–46.0)
Hemoglobin: 13.8 g/dL (ref 12.0–15.0)
LYMPHS PCT: 25 %
Lymphs Abs: 2.1 10*3/uL (ref 0.7–4.0)
MCH: 29.7 pg (ref 26.0–34.0)
MCHC: 32.9 g/dL (ref 30.0–36.0)
MCV: 90.1 fL (ref 78.0–100.0)
MONO ABS: 0.7 10*3/uL (ref 0.1–1.0)
Monocytes Relative: 8 %
NEUTROS ABS: 5.6 10*3/uL (ref 1.7–7.7)
NEUTROS PCT: 65 %
PLATELETS: 287 10*3/uL (ref 150–400)
RBC: 4.65 MIL/uL (ref 3.87–5.11)
RDW: 14.4 % (ref 11.5–15.5)
WBC: 8.7 10*3/uL (ref 4.0–10.5)

## 2016-12-07 LAB — BASIC METABOLIC PANEL
Anion gap: 10 (ref 5–15)
BUN: 14 mg/dL (ref 6–20)
CHLORIDE: 102 mmol/L (ref 101–111)
CO2: 24 mmol/L (ref 22–32)
CREATININE: 1.46 mg/dL — AB (ref 0.44–1.00)
Calcium: 9.2 mg/dL (ref 8.9–10.3)
GFR calc Af Amer: 39 mL/min — ABNORMAL LOW (ref >60)
GFR calc non Af Amer: 33 mL/min — ABNORMAL LOW (ref >60)
Glucose, Bld: 90 mg/dL (ref 65–99)
Potassium: 4 mmol/L (ref 3.5–5.1)
Sodium: 136 mmol/L (ref 135–145)

## 2016-12-07 LAB — CBC
HEMATOCRIT: 44.8 % (ref 36.0–46.0)
HEMOGLOBIN: 14.6 g/dL (ref 12.0–15.0)
MCH: 29.6 pg (ref 26.0–34.0)
MCHC: 32.6 g/dL (ref 30.0–36.0)
MCV: 90.9 fL (ref 78.0–100.0)
Platelets: 295 10*3/uL (ref 150–400)
RBC: 4.93 MIL/uL (ref 3.87–5.11)
RDW: 14.4 % (ref 11.5–15.5)
WBC: 8.4 10*3/uL (ref 4.0–10.5)

## 2016-12-07 MED ORDER — LORAZEPAM 2 MG/ML IJ SOLN
0.5000 mg | Freq: Four times a day (QID) | INTRAMUSCULAR | Status: DC | PRN
  Administered 2016-12-09: 0.5 mg via INTRAVENOUS
  Filled 2016-12-07: qty 1

## 2016-12-07 MED ORDER — DICLOFENAC SODIUM 1 % TD GEL
2.0000 g | Freq: Three times a day (TID) | TRANSDERMAL | Status: DC
  Administered 2016-12-08 – 2016-12-11 (×2): 2 g via TOPICAL
  Filled 2016-12-07: qty 100

## 2016-12-07 MED ORDER — SODIUM CHLORIDE 0.9 % IV SOLN
INTRAVENOUS | Status: DC
  Administered 2016-12-07: 22:00:00 via INTRAVENOUS

## 2016-12-07 NOTE — ED Notes (Signed)
Pt reports ongoing neck weakness for the past 3 days. Pt reports going to her neurologist today and was sent here for same. Pt reports the pain in her neck is new compared to her other pains.

## 2016-12-07 NOTE — Progress Notes (Signed)
NIF -23  VC 1.5

## 2016-12-07 NOTE — Telephone Encounter (Signed)
Returned call to patient. Patient is currently only on mestinon 60mg  TID for seropositive ocular myasthenia gravis which was diagnosed in June 2017.  She has not tolerated steroids in the past, but was doing great at her last visit in July 2018 on mestinon alone.  Starting earlier this week, she began having difficulty holding her head upright and cannot keep it straight. She picked up a cervical collar, which helps some, but if she takes it off, her head drops down.   She is even using it to go to bed.  She has noticed worsening droopy eyes and difficulty swallowing solids.  She also complains of shortness of breath which is worse with exertion.  Informed patient that her myasthenia has become more generalized and with her shortness of breath and head drop, she needs to go to the ER for admission for myasthenia exacerbation.  Need to exclude infection and start PLEX.  Reviewing her records, she also needs CT chest w contrast to evaluate for thymoma.    Donika K. Posey Pronto, DO

## 2016-12-07 NOTE — Consult Note (Signed)
NEURO HOSPITALIST CONSULT NOTE   Requestig physician: Dr. Vista Lawman  Reason for Consult: Myasthenia gravis exacerbation  History obtained from: Patient and Chart     HPI:                                                                                                                                          Erica Drake is an 81 y.o. female with a history of myasthenia gravis, chronic bronchitis, fibromyalgia, DM, intersitial cystitis and chronic fatigue, who presents to the ED for evaluation of a one week history of progressively worsening weakness in her neck, arms and legs. Neck weakness worsened to the point where she was not able to hold her head up on her own. She placed a soft collar around her neck to compensate for this. She also endorses eyelid drooping, binocular double vision and SOB, as well as difficulty swallowing, with an episode of aspiration at home. She was diagnosed by Dr. Jaynee Eagles with ocular myasthenia gravis approximately 2 years ago. The diagnosis was confirmed with serum antibody testing. Her MG is managed with mestinon; she is not on steroids or any other immunomodulatory therapy currently. In the past, she has not tolerated steroid treatments. She was last seen by her Neurologist in July and was doing well at that time on Mestinon.   Dr. 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  She has not tolerated steroids in the past, but was doing great at her last visit in July 2018 on mestinon alone. Starting earlier this week, she began having difficulty holding her head upright and cannot keep it straight. She picked up a cervical collar, which helps some, but if she takes it off, her head drops down.   She is even using it to go to bed.  She has noticed worsening droopy eyes and difficulty swallowing solids.  She also  complains of shortness of breath which is worse with exertion. Informed patient that her myasthenia has become more generalized and with her shortness of breath and head drop, she needs to go to the ER for admission for myasthenia exacerbation.  Need to exclude infection and start PLEX.  Reviewing her records, she also needs CT chest w contrast to evaluate for thymoma."    Initial ED evaluation reveals a NIF of 23 and FVC of 1.5. CXR reveals no active cardiopulmonary disease. Cr is elevated at 1.46, with an eGFR of 33. No leukocytosis or anemia.   Past Medical History:  Diagnosis Date  . Allergic rhinitis   . Chronic bronchitis    some mild wheezing at change of weather-  . Chronic fatigue   . Complication of  anesthesia    pt reports she always has to be admitted after surgery with pulmon. consult  d/t respiratory history  . Decreased hearing    intol hearing aids. Deaf left ear, hearing aid right ear.  . Diabetes mellitus without complication ([place])   . Eustachian tube dysfunction    decreased hearing  . Fibromyalgia   . GERD (gastroesophageal reflux disease)    [place]  . Hx of myasthenia gravis   . Interstitial cystitis    [facility]-Dr. Karsten Ro sees periodically.(occ. self caths at home)  . Vertigo     Past Surgical History:  Procedure Laterality Date  . CHOLECYSTECTOMY N/A 01/14/2015   Procedure: LAPAROSCOPIC CHOLECYSTECTOMY WITH INTRAOPERATIVE CHOLANGIOGRAM;  Surgeon: Johnathan Hausen, MD;  Location: [facility];  Service: General;  Laterality: N/A;  . KNEE ARTHROSCOPY WITH MEDIAL MENISECTOMY Left 07/18/2016   Procedure: LEFT KNEE ARTHROSCOPY WITH MEDIAL MENISECTOMY dedribement and chrodralplasty;  Surgeon: Gaynelle Arabian, MD;  Location: [facility];  Service: Orthopedics;  Laterality: Left;  . NISSEN FUNDOPLICATION     tx of GERD    Family History  Problem Relation Age of Onset  . Adopted: Yes  . Asthma Mother        biologic  . Asthma Son   . Stroke Neg Hx   . Neuropathy Neg  Hx   . Multiple sclerosis Neg Hx    Social History:  reports that she has quit smoking. She quit smokeless tobacco use about 42 years ago. She reports that she does not drink alcohol or use drugs.  Allergies  Allergen Reactions  . Cortisone Shortness Of Breath, Swelling and Other (See Comments)    Tongue swelling   . Amlodipine Besylate Cough  . Chlorhexidine Gluconate Itching    Skin on face turned red   . Clindamycin Itching  . Dexamethasone Other (See Comments)    Per MD  . Duloxetine Other (See Comments)    Unknown reaction  . Morphine Sulfate Other (See Comments)    Unknown reaction  . Niacin Other (See Comments)  . Other Other (See Comments)    Pollen - nasal reaction  . Prednisone Other (See Comments)    Causes stomach pain. Sores in her mouth.   . Rosuvastatin Calcium Other (See Comments)    Muscle aches  . Statins Other (See Comments)    Muscle aches  . Sulfa Antibiotics Other (See Comments)    May possibly have caused deafness in one ear  . Valsartan Other (See Comments)    Unknown reaction  . Strawberry Extract Itching and Rash    HOME MEDICATIONS:                                                                                                                        ROS:  No fever, chest pain or abdominal pain. Does not endorse confusion or language dysfunction. Positive for chronic muskuloskeletal pain in a broad band-like distribution across her chest.   Blood pressure (!) 152/69, pulse 91, temperature 98.3 F (36.8 C), resp. rate 18, SpO2 92 %.  General Examination:                                                                                                      HEENT-  [place]/AT    Lungs- Respirations are unlabored.  Extremities- Warm and well-perfused  Neurological Examination Mental Status: Alert, oriented, thought  content appropriate.  Speech fluent without evidence of aphasia. Minimal dysarthria noted transiently during interview. Able to follow all commands without difficulty. Cranial Nerves: II: Visual fields intact. PERRL.  III,IV, VI: Mild bilateral ptosis, worse on the right. Mild weakness of eyelid closure on the right. EOM are full horizontally and vertically without gross exotropia/esotropia; however, patient complains of binocular double vision with horizontal gaze. No nystagmus.  V,VII: Smile is symmetric; subtle "myasthenic snarl" noted. Facial temp sensation normal bilaterally VIII: Hearing intact to voice IX,X: Palate rises symmetrically XI: Soft collar in place XII: Midline tongue extension; no atrophy or fasciculations noted Motor: RUE: 4+/5 proximal and distal LUE: 4-/5 deltoid and grip; otherwise 4/5 proximal and distal RLE: 4/5 hip flexion, otherwise 5/5 LLE: 4-/5 strength; exam somewhat compromised by knee pain Normal tone throughout; no atrophy noted Sensory: Temp and light touch intact x 4. No extinction Deep Tendon Reflexes: 2+ and symmetric throughout (deferred left patellar due to pain) Cerebellar: No ataxia with FNF bilaterally Gait: Deferred   Lab Results: Basic Metabolic Panel:  Recent Labs Lab 12/07/16 1530  NA 136  K 4.0  CL 102  CO2 24  GLUCOSE 90  BUN 14  CREATININE 1.46*  CALCIUM 9.2    Liver Function Tests: No results for input(s): AST, ALT, ALKPHOS, BILITOT, PROT, ALBUMIN in the last 168 hours. No results for input(s): LIPASE, AMYLASE in the last 168 hours. No results for input(s): AMMONIA in the last 168 hours.  CBC:  Recent Labs Lab 12/07/16 1530 12/07/16 2051  WBC 8.4 8.7  NEUTROABS  --  5.6  HGB 14.6 13.8  HCT 44.8 41.9  MCV 90.9 90.1  PLT 295 287    Cardiac Enzymes: No results for input(s): CKTOTAL, CKMB, CKMBINDEX, TROPONINI in the last 168 hours.  Lipid Panel: No results for input(s): CHOL, TRIG, HDL, CHOLHDL, VLDL, LDLCALC in  the last 168 hours.  CBG: No results for input(s): GLUCAP in the last 168 hours.  Microbiology: Results for orders placed or performed during the hospital encounter of 07/11/16  Surgical pcr screen     Status: None   Collection Time: 07/11/16  2:20 PM  Result Value Ref Range Status   MRSA, PCR NEGATIVE NEGATIVE Final   Staphylococcus aureus NEGATIVE NEGATIVE Final    Comment:        The Xpert SA Assay (FDA approved for NASAL specimens in patients over 95 years of age), is one component of a comprehensive surveillance program.  Test  performance has been validated by [facility] for patients greater than or equal to 14 year old. It is not intended to diagnose infection nor to guide or monitor treatment.     Coagulation Studies: No results for input(s): LABPROT, INR in the last 72 hours.  Imaging: Dg Chest 2 View  Result Date: 12/07/2016 CLINICAL DATA:  sob weakness EXAM: CHEST  2 VIEW COMPARISON:  03/16/16 FINDINGS: The lungs are clear. The pulmonary vasculature is normal. Heart size is normal. Hilar and mediastinal contours are unremarkable. There is no pleural effusion. IMPRESSION: No active cardiopulmonary disease. Electronically Signed   By: Andreas Newport M.D.   On: 12/07/2016 21:21    Assessment: 81 year old female with history and exam findings most consistent with myasthenia gravis exacerbation 1. Initially diagnosed with ocular myasthenia gravis, her symptoms have spread to involve neck, arms and legs. Also now experiencing dysphagia and exertional SOB. FVC is low at 1.5, but not fulfilling criteria for an ICU bed. A step-down bed would be ideal.  2. Has been managed as an outpatient with Mestinon. Not currently on an immunomodulatory regimen. 3. AKI with eGFR of 33.  4. PMHx also includes chronic bronchitis, fibromyalgia, DM, intersitial cystitis and chronic fatigue.  Recommendations: 1. Admit for IVIG treatment: 0.4 g/kg qd x 5 days. A step-down bed would be  ideal.  2. Respiratory therapy consult for regularly scheduled FVC and NIF q6h. 3. Cardiac telemetry and pulse oximetry 4. Continue her home dose of Mestinon. No evidence for increased oral secretions on exam.  5. CT chest with contrast to evaluate for thymoma after volume resuscitation with IVF x 24 hours to improve renal function.  6. Avoid medications known to exacerbate myasthenia gravis. 7. PT and OT consults.    Electronically signed: Dr. Kerney Elbe 12/07/2016, 11:02 PM

## 2016-12-07 NOTE — Telephone Encounter (Signed)
Pt called again and said she can't hold her head up and needs a call back regarding this

## 2016-12-07 NOTE — ED Provider Notes (Addendum)
[facility] DEPT Provider Note   CSN: [id number] Arrival date & time: 12/07/16  1505     History   Chief Complaint Chief Complaint  Patient presents with  . Weakness  . Myasthenia Gravis    HPI Erica Drake is a 81 y.o. female.  Patient with history of myasthenia gravis. Patient reports neck weakness for about the past week. Patient has used a soft collar so that her head will not flop down. Patient with telephone consultation with her neurologist today. Patient was sent in here for admission. Concern is for worsening myasthenia gravis. In the past patient has not tolerated steroid treatments. Patient last seen by neurology in July patient was doing well at that time on Mestinon. Patient also relays that she's had some exertional shortness of breath. This hasn't difficulty swallowing solid food.      Past Medical History:  Diagnosis Date  . Allergic rhinitis   . Chronic bronchitis    some mild wheezing at change of weather-  . Chronic fatigue   . Complication of anesthesia    pt reports she always has to be admitted after surgery with pulmon. consult  d/t respiratory history  . Decreased hearing    intol hearing aids. Deaf left ear, hearing aid right ear.  . Diabetes mellitus without complication ([place])   . Eustachian tube dysfunction    decreased hearing  . Fibromyalgia   . GERD (gastroesophageal reflux disease)    [place]issen  . Hx of myasthenia gravis   . Interstitial cystitis    [facility]-Dr. Karsten Ro sees periodically.(occ. self caths at home)  . Vertigo     Patient Active Problem List   Diagnosis Date Noted  . Acute exacerbation of myasthenia gravis ([place]) 12/07/2016  . Acute medial meniscal tear 07/18/2016  . Acute medial meniscal tear, left, subsequent encounter 07/18/2016  . Asthmatic bronchitis, mild persistent, with acute exacerbation 12/29/2015  . Myasthenia gravis with (acute) exacerbation ([place]) 09/29/2015  . Chronic cholecystitis 01/14/2015    . Angioedema 05/23/2014  . INSOMNIA 05/31/2007  . EUSTACHIAN TUBE DYSFUNCTION 04/30/2007  . Seasonal and perennial allergic rhinitis 04/30/2007  . COPD mixed type ([place]) 04/30/2007  . Esophageal reflux 04/30/2007  . Fibromyalgia 04/30/2007    Past Surgical History:  Procedure Laterality Date  . CHOLECYSTECTOMY N/A 01/14/2015   Procedure: LAPAROSCOPIC CHOLECYSTECTOMY WITH INTRAOPERATIVE CHOLANGIOGRAM;  Surgeon: Johnathan Hausen, MD;  Location: [facility];  Service: General;  Laterality: N/A;  . KNEE ARTHROSCOPY WITH MEDIAL MENISECTOMY Left 07/18/2016   Procedure: LEFT KNEE ARTHROSCOPY WITH MEDIAL MENISECTOMY dedribement and chrodralplasty;  Surgeon: Gaynelle Arabian, MD;  Location: [facility];  Service: Orthopedics;  Laterality: Left;  . NISSEN FUNDOPLICATION     tx of GERD    OB History    No data available       Home Medications    Prior to Admission medications   Medication Sig Start Date End Date Taking? Authorizing Provider  albuterol (PROAIR HFA) 108 (90 Base) MCG/ACT inhaler Inhale 1-2 puffs into the lungs every 4 (four) hours as needed for wheezing or shortness of breath.    Yes [provider]  albuterol (PROVENTIL) (2.5 MG/3ML) 0.083% nebulizer solution Take 3 mLs (2.5 mg total) by nebulization every 6 (six) hours as needed for wheezing or shortness of breath. 03/06/16  Yes Young, Tarri Fuller D, MD  buPROPion (WELLBUTRIN SR) 200 MG 12 hr tablet Take 200 mg by mouth 2 (two) times daily after a meal.    Yes [provider]  clonazePAM (KLONOPIN) 1 MG disintegrating tablet Take 1 mg by mouth See admin instructions. Dissolve 1 tablet (1 mg) under the tongue daily after going to bed; also dissolve 1 tablet (1 mg) on Tuesdays and Thursdays prior to going to doctor's appointment for sodium bicarb treatments. 11/22/16  Yes [provider]  clonazePAM (KLONOPIN) 1 MG tablet Take 1 tablet by mouth See admin instructions. Take 1 tablet (1 mg) three times daily - morning, after  supper and before bed 02/21/16  Yes [provider]  [place] into the lungs at bedtime. CPAP   Yes [provider]  pyridostigmine (MESTINON) 60 MG tablet Take 1 tablet (60 mg total) by mouth 3 (three) times daily. 09/03/16  Yes Patel, Donika K, DO  trimethoprim (TRIMPEX) 100 MG tablet Take 100 mg by mouth daily. 10/19/16  Yes [provider]  umeclidinium-vilanterol (ANORO ELLIPTA) 62.5-25 MCG/INH AEPB Inhale 1 puff into the lungs daily. 04/02/16  Yes Young, Tarri Fuller D, MD  Fluticasone-Umeclidin-Vilant (TRELEGY ELLIPTA) 100-62.5-25 MCG/INH AEPB Inhale 1 puff into the lungs daily. Patient not taking: Reported on 12/07/2016 09/13/16   Deneise Lever, MD  HYDROcodone-acetaminophen (NORCO) 5-325 MG tablet Take 1-2 tablets by mouth every 4 (four) hours as needed for moderate pain. Patient not taking: Reported on 12/07/2016 07/18/16   Gaynelle Arabian, MD  methocarbamol (ROBAXIN) 500 MG tablet Take 1 tablet (500 mg total) by mouth 4 (four) times daily. As needed for muscle spasm Patient not taking: Reported on 12/07/2016 07/18/16   Gaynelle Arabian, MD    Family History Family History  Problem Relation Age of Onset  . Adopted: Yes  . Asthma Mother        biologic  . Asthma Son   . Stroke Neg Hx   . Neuropathy Neg Hx   . Multiple sclerosis Neg Hx     Social History Social History  Substance Use Topics  . Smoking status: Former Research scientist (life sciences)  . Smokeless tobacco: Former Systems developer    Quit date: 01/09/1974  . Alcohol use No     Allergies   Cortisone; Amlodipine besylate; Chlorhexidine gluconate; Clindamycin; Dexamethasone; Duloxetine; Morphine sulfate; Niacin; Other; Prednisone; Rosuvastatin calcium; Statins; Sulfa antibiotics; Valsartan; and Strawberry extract   Review of Systems Review of Systems  Constitutional: Negative for fever.  HENT: Positive for trouble swallowing.   Eyes: Negative for visual disturbance.  Respiratory: Positive for shortness of  breath.   Cardiovascular: Negative for chest pain.  Gastrointestinal: Negative for abdominal pain.  Genitourinary: Negative for dysuria.  Musculoskeletal: Negative for back pain and neck pain.  Skin: Negative for rash.  Neurological: Positive for facial asymmetry and weakness.  Psychiatric/Behavioral: Negative for confusion.     Physical Exam Updated Vital Signs BP (!) 152/69   Pulse 91   Temp 98.3 F (36.8 C) (Oral)   Resp 18   SpO2 92%   Physical Exam  Constitutional: She is oriented to person, place, and time. She appears well-developed and well-nourished. No distress.  HENT:  Head: Normocephalic and atraumatic.  Eyes: Pupils are equal, round, and reactive to light. Conjunctivae are normal.  Drooping of the eyelids.  Neck:  A soft cervical collar in place. Neck weakness without soft collar in place.  Cardiovascular: Normal rate and regular rhythm.   Pulmonary/Chest: No stridor. No respiratory distress.  Abdominal: There is no tenderness.  Neurological: She is alert and oriented to person, place, and time. A cranial nerve deficit is present. She exhibits abnormal muscle tone.  Skin: No erythema.  Nursing note and vitals reviewed.    ED Treatments / Results  Labs (all labs ordered are listed, but only abnormal results are displayed) Labs Reviewed  BASIC METABOLIC PANEL - Abnormal; Notable for the following:       Result Value   Creatinine, Ser 1.46 (*)    GFR calc non Af Amer 33 (*)    GFR calc Af Amer 39 (*)    All other components within normal limits  CBC  CBC WITH DIFFERENTIAL/PLATELET  URINALYSIS, ROUTINE W REFLEX MICROSCOPIC    EKG  EKG Interpretation None       Radiology Dg Chest 2 View  Result Date: 12/07/2016 CLINICAL DATA:  sob weakness EXAM: CHEST  2 VIEW COMPARISON:  03/16/16 FINDINGS: The lungs are clear. The pulmonary vasculature is normal. Heart size is normal. Hilar and mediastinal contours are unremarkable. There is no pleural effusion.  IMPRESSION: No active cardiopulmonary disease. Electronically Signed   By: Andreas Newport M.D.   On: 12/07/2016 21:21    Procedures Procedures (including critical care time)  Medications Ordered in ED Medications  0.9 %  sodium chloride infusion ( Intravenous New Bag/Given 12/07/16 2130)  LORazepam (ATIVAN) injection 0.5 mg (not administered)  diclofenac sodium (VOLTAREN) 1 % transdermal gel 2 g (not administered)     Initial Impression / Assessment and Plan / ED Course  I have reviewed the triage vital signs and the nursing notes.  Pertinent labs & imaging results that were available during my care of the patient were reviewed by me and considered in my medical decision making (see chart for details).    Discussed with the neural hospitalist. Patient followed by [facility] neurology. They have concerns for worsening myasthenia gravis. Over the past week. Neurology is planning on immunotherapy. Had respiratory therapy do forced vital capacity as well as negative inspiratory force. Patient clinically at rest without any shortness of breath.  Hospitalist will admit. Basic labs ordered.   Final Clinical Impressions(s) / ED Diagnoses   Final diagnoses:  Myasthenia gravis [facility])    New Prescriptions New Prescriptions   No medications on file     Fredia Sorrow, MD 12/07/16 2229    Fredia Sorrow, MD 12/07/16 2230

## 2016-12-07 NOTE — ED Triage Notes (Signed)
Pt presents to ED for hx of myasthenia gravis, fibromyalgia, and chronic fatigue, with recent increasing weakness in her neck, arms and legs.  Pt states symptoms began to worsen Wednesday.  Pt ambulatory at triage, NAD, tachycardic.

## 2016-12-07 NOTE — Telephone Encounter (Signed)
Patient states she does not have the strength to hold her head up (  Is using a cervical collar to help with this) and is experiencing soreness in the back of her neck.  Questioned if she should increase her Mestinon.  Please advise.

## 2016-12-07 NOTE — ED Notes (Signed)
Patient transported to X-ray 

## 2016-12-07 NOTE — Care Management Note (Signed)
Case Management Note  From [place], active with [facility].

## 2016-12-07 NOTE — H&P (Signed)
History and Physical    Erica Drake:[id number] DOB: 02/20/1936 DOA: 12/07/2016  Referring MD/NP/PA:  PCP: Erica Solian, MD  Outpatient Specialists: [facility] Patient coming from: Home  Chief Complaint: Neck weakness  HPI: Erica Drake is a 81 y.o. 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 She has been using cervical collar to keep  the head from dropping. She had also been extremity some drooping of her eyes and mild shortness of breath with exertion without fever or chills. No dysuria,no sputum production but admits to a transient episode of coughing yesterday while eating and trying to hold her neck up, but denies aspirating, no fever or chills.  She called her neurologist with advice to continue to further evaluation of possible myasthenia exacerbation    ED Course: At the ED patient was hemodynamically stable with systolic BP 1 52 mmHg (denies history of hypertension, however she has history of intolerance to amlodipine). Chest x-ray was negative for any acute infiltrate. Blood work indicated mild worsening of her baseline creatinine from 0.36-1.46 5 months ago. Hospitalist consulted for admission.   Review of Systems: As per HPI otherwise 10 point review of systems negative.    Past Medical History:  Diagnosis Date  . Allergic rhinitis   . Chronic bronchitis    some mild wheezing at change of weather-  . Chronic fatigue   . Complication of anesthesia    pt reports she always has to be admitted after surgery with pulmon. consult  d/t respiratory history  . Decreased hearing    intol hearing aids. Deaf left ear, hearing aid right ear.  . Diabetes mellitus without complication ([place])    . Eustachian tube dysfunction    decreased hearing  . Fibromyalgia   . GERD (gastroesophageal reflux disease)    Nissen  . Hx of myasthenia gravis   . Interstitial cystitis    [facility]-Dr. Karsten Ro sees periodically.(occ. self caths at home)  . Vertigo     Past Surgical History:  Procedure Laterality Date  . CHOLECYSTECTOMY N/A 01/14/2015   Procedure: LAPAROSCOPIC CHOLECYSTECTOMY WITH INTRAOPERATIVE CHOLANGIOGRAM;  Surgeon: Erica Hausen, MD;  Location: [facility];  Service: General;  Laterality: N/A;  . KNEE ARTHROSCOPY WITH MEDIAL MENISECTOMY Left 07/18/2016   Procedure: LEFT KNEE ARTHROSCOPY WITH MEDIAL MENISECTOMY dedribement and chrodralplasty;  Surgeon: Erica Arabian, MD;  Location: [facility];  Service: Orthopedics;  Laterality: Left;  . NISSEN FUNDOPLICATION     tx of GERD     reports that she has quit smoking. She quit smokeless tobacco use about 42 years ago. She reports that she does not drink alcohol or use drugs.  Allergies  Allergen Reactions  . Cortisone Shortness Of Breath, Swelling and Other (See Comments)    Tongue swelling   . Amlodipine Besylate Cough  . Chlorhexidine Gluconate Itching    Skin on face turned red   . Clindamycin Itching  . Dexamethasone Other (See Comments)    Per MD  . Duloxetine Other (See Comments)    Unknown reaction  . Morphine Sulfate Other (See Comments)    Unknown reaction  . Niacin Other (See Comments)  . Other Other (See Comments)    Pollen - nasal reaction  . Prednisone  Other (See Comments)    Causes stomach pain. Sores in her mouth.   . Rosuvastatin Calcium Other (See Comments)    Muscle aches  . Statins Other (See Comments)    Muscle aches  . Sulfa Antibiotics Other (See Comments)    May possibly have caused deafness in one ear  . Valsartan Other (See Comments)    Unknown reaction  . Strawberry Extract Itching and Rash    Family History  Problem Relation Age of Onset  . Adopted: Yes  . Asthma Mother         biologic  . Asthma Son   . Stroke Neg Hx   . Neuropathy Neg Hx   . Multiple sclerosis Neg Hx      Prior to Admission medications   Medication Sig Start Date End Date Taking? Authorizing Provider  albuterol (PROAIR HFA) 108 (90 Base) MCG/ACT inhaler Inhale 1-2 puffs into the lungs every 4 (four) hours as needed for wheezing or shortness of breath.    Yes [provider]  albuterol (PROVENTIL) (2.5 MG/3ML) 0.083% nebulizer solution Take 3 mLs (2.5 mg total) by nebulization every 6 (six) hours as needed for wheezing or shortness of breath. 03/06/16  Yes Young, Tarri Fuller D, MD  buPROPion (WELLBUTRIN SR) 200 MG 12 hr tablet Take 200 mg by mouth 2 (two) times daily after a meal.    Yes [provider]  clonazePAM (KLONOPIN) 1 MG disintegrating tablet Take 1 mg by mouth See admin instructions. Dissolve 1 tablet (1 mg) under the tongue daily after going to bed; also dissolve 1 tablet (1 mg) on Tuesdays and Thursdays prior to going to doctor's appointment for sodium bicarb treatments. 11/22/16  Yes [provider]  clonazePAM (KLONOPIN) 1 MG tablet Take 1 tablet by mouth See admin instructions. Take 1 tablet (1 mg) three times daily - morning, after supper and before bed 02/21/16  Yes [provider]  [place] into the lungs at bedtime. CPAP   Yes [provider]  pyridostigmine (MESTINON) 60 MG tablet Take 1 tablet (60 mg total) by mouth 3 (three) times daily. 09/03/16  Yes Patel, Donika K, DO  trimethoprim (TRIMPEX) 100 MG tablet Take 100 mg by mouth daily. 10/19/16  Yes [provider]  umeclidinium-vilanterol (ANORO ELLIPTA) 62.5-25 MCG/INH AEPB Inhale 1 puff into the lungs daily. 04/02/16  Yes Young, Tarri Fuller D, MD  Fluticasone-Umeclidin-Vilant (TRELEGY ELLIPTA) 100-62.5-25 MCG/INH AEPB Inhale 1 puff into the lungs daily. Patient not taking: Reported on 12/07/2016 09/13/16   Erica Lever, MD  HYDROcodone-acetaminophen (NORCO) 5-325  MG tablet Take 1-2 tablets by mouth every 4 (four) hours as needed for moderate pain. Patient not taking: Reported on 12/07/2016 07/18/16   Erica Arabian, MD  methocarbamol (ROBAXIN) 500 MG tablet Take 1 tablet (500 mg total) by mouth 4 (four) times daily. As needed for muscle spasm Patient not taking: Reported on 12/07/2016 07/18/16   Erica Arabian, MD    Physical Exam: Vitals:   12/07/16 1521 12/07/16 1924  BP: (!) 170/85 (!) 158/81  Pulse: (!) 103 97  Resp: 16 18  Temp: 98.3 F (36.8 C)   TempSrc: Oral   SpO2: 94% 94%      Constitutional: NAD, calm, comfortable Vitals:   12/07/16 1521 12/07/16 1924  BP: (!) 170/85 (!) 158/81  Pulse: (!) 103 97  Resp: 16 18  Temp: 98.3 F (36.8 C)   TempSrc: Oral   SpO2: 94% 94%   Eyes: PERRL, lids and conjunctivae  normal, drooping of the eyelids ENMT: Mucous membranes are mildly dry. Posterior pharynx clear of any exudate or lesions.Normal dentition.  Neck: Cervical collar in situ, weak, supple, no masses, no thyromegaly Respiratory: clear to auscultation bilaterally, no wheezing, no crackles. Normal respiratory effort. No accessory muscle use.  Cardiovascular: Regular rate and rhythm, no murmurs / rubs / gallops. No extremity edema. 2+ pedal pulses. No carotid bruits.  Abdomen: no tenderness, no masses palpated. No hepatosplenomegaly. Bowel sounds positive.  Musculoskeletal: no clubbing / cyanosis.  Skin: no rashes, lesions, ulcers. No induration Neurologic: Alert and oriented 3 Psychiatric: Normal judgment and insight. Normal mood.     Labs on Admission: I have personally reviewed following labs and imaging studies  CBC:  Recent Labs Lab 12/07/16 1530  WBC 8.4  HGB 14.6  HCT 44.8  MCV 90.9  PLT 712   Basic Metabolic Panel:  Recent Labs Lab 12/07/16 1530  NA 136  K 4.0  CL 102  CO2 24  GLUCOSE 90  BUN 14  CREATININE 1.46*  CALCIUM 9.2   GFR: CrCl cannot be calculated (Unknown ideal weight.). Liver Function  Tests: No results for input(s): AST, ALT, ALKPHOS, BILITOT, PROT, ALBUMIN in the last 168 hours. No results for input(s): LIPASE, AMYLASE in the last 168 hours. No results for input(s): AMMONIA in the last 168 hours. Coagulation Profile: No results for input(s): INR, PROTIME in the last 168 hours. Cardiac Enzymes: No results for input(s): CKTOTAL, CKMB, CKMBINDEX, TROPONINI in the last 168 hours. BNP (last 3 results) No results for input(s): PROBNP in the last 8760 hours. HbA1C: No results for input(s): HGBA1C in the last 72 hours. CBG: No results for input(s): GLUCAP in the last 168 hours. Lipid Profile: No results for input(s): CHOL, HDL, LDLCALC, TRIG, CHOLHDL, LDLDIRECT in the last 72 hours. Thyroid Function Tests: No results for input(s): TSH, T4TOTAL, FREET4, T3FREE, THYROIDAB in the last 72 hours. Anemia Panel: No results for input(s): VITAMINB12, FOLATE, FERRITIN, TIBC, IRON, RETICCTPCT in the last 72 hours. Urine analysis:    Component Value Date/Time   COLORURINE AMBER (A) 12/01/2015 1307   APPEARANCEUR CLEAR 12/01/2015 1307   LABSPEC 1.041 (H) 12/01/2015 1307   PHURINE 5.5 12/01/2015 1307   GLUCOSEU NEGATIVE 12/01/2015 1307   HGBUR SMALL (A) 12/01/2015 1307   BILIRUBINUR SMALL (A) 12/01/2015 1307   KETONESUR NEGATIVE 12/01/2015 1307   PROTEINUR NEGATIVE 12/01/2015 1307   NITRITE NEGATIVE 12/01/2015 1307   LEUKOCYTESUR NEGATIVE 12/01/2015 1307   Sepsis Labs: @LABRCNTIP (procalcitonin:4,lacticidven:4) )No results found for this or any previous visit (from the past 240 hour(s)).   Radiological Exams on Admission: No results found. EKG: Ordered  Assessment/Plan Active Problems:   * No active hospital problems. *  #1 acute exacerbation of myasthenia gravis: High risk for respiratory compromise and aspiration Previously steroid intolerance Stepdown admitted for observation purposes and respiratory monitoring overnight May transfer to telemetry tomorrow Nothing  by mouth for now Swallowing eval Aspiration precautions Hold muscle relaxants Limited sedative hypnotics as much as possible Supplemental oxygen as needed Twelve-lead EKG Troponin Substitute as much as possible home medications to IV for now till swallowing evaluated Neurologic consulted for possible IVIG/ plasma estrogen therapy   #2 dehydration: Suspect due to poor oral intake IV rehydration Supportive care  #3 acute on chronic kidney injury: Due to dehydration with some dictation IV rehydration with renal function and electrolytes monitoring   DVT prophylaxis: SCD's Code Status: Full Family Communication:  Disposition Plan:  Consults called: Neurology (Dr. Cheral Marker)  Admission status: (inpatient / SDU)   OSEI-BONSU,Nirvana Blanchett MD [facility] Pager [phone]  If 7PM-7AM, please contact night-coverage [email or web address] Password [email or web address]  12/07/2016, 9:22 PM

## 2016-12-08 ENCOUNTER — Inpatient Hospital Stay (HOSPITAL_COMMUNITY): Payer: Medicare Other

## 2016-12-08 ENCOUNTER — Encounter (HOSPITAL_COMMUNITY): Payer: Self-pay | Admitting: *Deleted

## 2016-12-08 DIAGNOSIS — N179 Acute kidney failure, unspecified: Secondary | ICD-10-CM

## 2016-12-08 DIAGNOSIS — J45902 Unspecified asthma with status asthmaticus: Secondary | ICD-10-CM

## 2016-12-08 DIAGNOSIS — F329 Major depressive disorder, single episode, unspecified: Secondary | ICD-10-CM

## 2016-12-08 LAB — URINALYSIS, ROUTINE W REFLEX MICROSCOPIC
BACTERIA UA: NONE SEEN
Bilirubin Urine: NEGATIVE
Glucose, UA: NEGATIVE mg/dL
Hgb urine dipstick: NEGATIVE
KETONES UR: NEGATIVE mg/dL
LEUKOCYTES UA: NEGATIVE
Nitrite: NEGATIVE
PH: 8 (ref 5.0–8.0)
PROTEIN: NEGATIVE mg/dL
RBC / HPF: NONE SEEN RBC/hpf (ref 0–5)
SQUAMOUS EPITHELIAL / LPF: NONE SEEN
Specific Gravity, Urine: 1.004 — ABNORMAL LOW (ref 1.005–1.030)

## 2016-12-08 LAB — BLOOD GAS, ARTERIAL
Acid-Base Excess: 3.2 mmol/L — ABNORMAL HIGH (ref 0.0–2.0)
BICARBONATE: 28.2 mmol/L — AB (ref 20.0–28.0)
DRAWN BY: 44135
O2 Saturation: 91.8 %
PATIENT TEMPERATURE: 98.6
PH ART: 7.359 (ref 7.350–7.450)
pCO2 arterial: 51.4 mmHg — ABNORMAL HIGH (ref 32.0–48.0)
pO2, Arterial: 66.5 mmHg — ABNORMAL LOW (ref 83.0–108.0)

## 2016-12-08 LAB — MRSA PCR SCREENING: MRSA BY PCR: NEGATIVE

## 2016-12-08 LAB — GLUCOSE, CAPILLARY
GLUCOSE-CAPILLARY: 117 mg/dL — AB (ref 65–99)
GLUCOSE-CAPILLARY: 105 mg/dL — AB (ref 65–99)

## 2016-12-08 LAB — MAGNESIUM: Magnesium: 2 mg/dL (ref 1.7–2.4)

## 2016-12-08 LAB — TROPONIN I
Troponin I: <0.03 ng/mL (ref <0.03)
Troponin I: <0.03 ng/mL (ref <0.03)

## 2016-12-08 LAB — PHOSPHORUS: PHOSPHORUS: 3.3 mg/dL (ref 2.5–4.6)

## 2016-12-08 LAB — TSH: TSH: 2.007 u[IU]/mL (ref 0.350–4.500)

## 2016-12-08 MED ORDER — IMMUNE GLOBULIN (HUMAN) 5 GM/50ML IV SOLN
400.0000 mg/kg | INTRAVENOUS | Status: AC
  Administered 2016-12-08 – 2016-12-12 (×5): 25 g via INTRAVENOUS
  Filled 2016-12-08 (×5): qty 50

## 2016-12-08 MED ORDER — BUPROPION HCL 100 MG PO TABS
200.0000 mg | ORAL_TABLET | Freq: Two times a day (BID) | ORAL | Status: DC
  Administered 2016-12-08 – 2016-12-09 (×3): 200 mg via ORAL
  Filled 2016-12-08 (×3): qty 2

## 2016-12-08 MED ORDER — FLUTICASONE-UMECLIDIN-VILANT 100-62.5-25 MCG/INH IN AEPB: 1.0000 | INHALATION_SPRAY | Freq: Every day | RESPIRATORY_TRACT | Status: DC

## 2016-12-08 MED ORDER — IMMUNE GLOBULIN HUMAN NICU IV SYRINGE 100 MG/ML: 400.0000 mg/kg | INTRAMUSCULAR | Status: DC

## 2016-12-08 MED ORDER — CLONAZEPAM 1 MG PO TABS
1.0000 mg | ORAL_TABLET | Freq: Three times a day (TID) | ORAL | Status: DC
  Administered 2016-12-08 – 2016-12-12 (×13): 1 mg via ORAL
  Filled 2016-12-08 (×13): qty 1

## 2016-12-08 MED ORDER — UMECLIDINIUM BROMIDE 62.5 MCG/INH IN AEPB
1.0000 | INHALATION_SPRAY | Freq: Every day | RESPIRATORY_TRACT | Status: DC
  Administered 2016-12-08 – 2016-12-12 (×5): 1 via RESPIRATORY_TRACT
  Filled 2016-12-08: qty 7

## 2016-12-08 MED ORDER — PYRIDOSTIGMINE BROMIDE 60 MG PO TABS
60.0000 mg | ORAL_TABLET | Freq: Three times a day (TID) | ORAL | Status: DC
  Administered 2016-12-08 – 2016-12-12 (×14): 60 mg via ORAL
  Filled 2016-12-08 (×15): qty 1

## 2016-12-08 MED ORDER — IOPAMIDOL (ISOVUE-300) INJECTION 61%
INTRAVENOUS | Status: AC
  Administered 2016-12-08: 50 mL
  Filled 2016-12-08: qty 50

## 2016-12-08 MED ORDER — DEXTROSE-NACL 5-0.9 % IV SOLN
INTRAVENOUS | Status: DC
  Administered 2016-12-08: 03:00:00 via INTRAVENOUS

## 2016-12-08 MED ORDER — FLUTICASONE FUROATE-VILANTEROL 100-25 MCG/INH IN AEPB
1.0000 | INHALATION_SPRAY | Freq: Every day | RESPIRATORY_TRACT | Status: DC
  Administered 2016-12-08 – 2016-12-12 (×4): 1 via RESPIRATORY_TRACT
  Filled 2016-12-08 (×2): qty 28

## 2016-12-08 MED ORDER — SODIUM CHLORIDE 0.9 % IV SOLN
INTRAVENOUS | Status: DC
  Administered 2016-12-08 (×2): via INTRAVENOUS

## 2016-12-08 NOTE — Plan of Care (Signed)
Problem: Safety: Goal: Ability to remain free from injury will improve Outcome: Progressing Free of falls at this time. Patient educated on calling out for assistance.

## 2016-12-08 NOTE — Evaluation (Signed)
Clinical/Bedside Swallow Evaluation Patient Details  Name: Erica Drake MRN: [id number] Date of Birth: 1935/09/30  Today's Date: 12/08/2016 Time: SLP Start Time (ACUTE ONLY): 7564 SLP Stop Time (ACUTE ONLY): 0840 SLP Time Calculation (min) (ACUTE ONLY): 12 min  Past Medical History:  Past Medical History:  Diagnosis Date  . Allergic rhinitis   . Chronic bronchitis    some mild wheezing at change of weather-  . Chronic fatigue   . Complication of anesthesia    pt reports she always has to be admitted after surgery with pulmon. consult  d/t respiratory history  . Decreased hearing    intol hearing aids. Deaf left ear, hearing aid right ear.  . Diabetes mellitus without complication ([place])   . Eustachian tube dysfunction    decreased hearing  . Fibromyalgia   . GERD (gastroesophageal reflux disease)    Nissen  . Hx of myasthenia gravis   . Interstitial cystitis    [facility]-Dr. Karsten Ro sees periodically.(occ. self caths at home)  . Vertigo    Past Surgical History:  Past Surgical History:  Procedure Laterality Date  . CHOLECYSTECTOMY N/A 01/14/2015   Procedure: LAPAROSCOPIC CHOLECYSTECTOMY WITH INTRAOPERATIVE CHOLANGIOGRAM;  Surgeon: Johnathan Hausen, MD;  Location: [facility];  Service: General;  Laterality: N/A;  . KNEE ARTHROSCOPY WITH MEDIAL MENISECTOMY Left 07/18/2016   Procedure: LEFT KNEE ARTHROSCOPY WITH MEDIAL MENISECTOMY dedribement and chrodralplasty;  Surgeon: Gaynelle Arabian, MD;  Location: [facility];  Service: Orthopedics;  Laterality: Left;  . NISSEN FUNDOPLICATION     tx of GERD   HPI:  Erica Drake an 81 y.o.femalewith a history of myasthenia gravis, chronic bronchitis, fibromyalgia, DM, GERD, intersitial cystitis and chronic fatigue, who presented with one week history of progressively worsening weakness in her neck, arms and legs. Neck weakness worsened to the point where she was not able to hold her head up on her own. She placed a soft collar around  her neck to compensate for this. She also endorses eyelid drooping, binocular double vision and SOB, as well as difficulty swallowing, per notes: "episode of aspiration at home."   Assessment / Plan / Recommendation Clinical Impression  Patient presents with oropharyngeal swallow which appears at bedside to be within functional limits with adequate airway protection. No overt signs of aspiration noted despite multiple challenges with consecutive cup and straw sips of thin liquid in excess of 3 oz. Oral preparation, control, clearance adequate for all consistencies trialed. Pt reports history of reflux which resolved after "they wrapped my stomach"; had anti-reflux operation in 2001. Oral motor examination is unremarkable; pt denies coughing or choking at home, stating, "I just couldn't hold my head up." Head control appears adequate for swallowing during this evaluation. Recommend regular diet with thin liquids; pt remains at mild risk for aspiration given myasthenia gravis, however she reports she utilizes caution when eating/drinking: "I just take my time." No further skilled ST recommended. Will s/o.  SLP Visit Diagnosis: Dysphagia, unspecified (R13.10)    Aspiration Risk  Mild aspiration risk    Diet Recommendation Regular;Thin liquid   Liquid Administration via: Cup;Straw Medication Administration: Whole meds with liquid Supervision: Patient able to self feed Compensations: Slow rate;Small sips/bites Postural Changes: Seated upright at 90 degrees    Other  Recommendations Oral Care Recommendations: Oral care BID Other Recommendations: Clarify dietary restrictions   Follow up Recommendations None      Frequency and Duration            Prognosis Prognosis for Safe  Diet Advancement: Good      Swallow Study   General Date of Onset: 12/07/16 HPI: Erica Drake an 81 y.o.femalewith a history of myasthenia gravis, chronic bronchitis, fibromyalgia, DM, GERD, intersitial  cystitis and chronic fatigue, who presented with one week history of progressively worsening weakness in her neck, arms and legs. Neck weakness worsened to the point where she was not able to hold her head up on her own. She placed a soft collar around her neck to compensate for this. She also endorses eyelid drooping, binocular double vision and SOB, as well as difficulty swallowing, per notes: "episode of aspiration at home." Type of Study: Bedside Swallow Evaluation Previous Swallow Assessment: 05/15/99 esophageal manometry findings: severe esophageal reflux disease with the patient refluxing 21% of the time total, and refluxing 27% of the time in the supine position. Diet Prior to this Study: NPO Temperature Spikes Noted: No Respiratory Status: Room air History of Recent Intubation: No Behavior/Cognition: Alert;Cooperative;Pleasant mood Oral Cavity Assessment: Within Functional Limits Oral Care Completed by SLP: No Oral Cavity - Dentition: Dentures, top;Adequate natural dentition Vision: Functional for self-feeding Self-Feeding Abilities: Able to feed self Patient Positioning: Upright in bed Baseline Vocal Quality: Normal Volitional Cough: Strong Volitional Swallow: Able to elicit    Oral/Motor/Sensory Function Overall Oral Motor/Sensory Function: Within functional limits   Ice Chips Ice chips: Within functional limits Presentation: Spoon   Thin Liquid Thin Liquid: Within functional limits Presentation: Cup;Straw;Self Fed    Nectar Thick Nectar Thick Liquid: Not tested   Honey Thick Honey Thick Liquid: Not tested   Puree Puree: Within functional limits Presentation: Spoon;Self Fed   Solid   [place], [place], CCC-SLP Speech-Language Pathologist [phone] Solid: Within functional limits Presentation: [place] 12/08/2016,8:55 AM

## 2016-12-08 NOTE — Progress Notes (Signed)
PROGRESS NOTE    Erica Drake  HEN:[id number] DOB: 09-12-1935 DOA: 12/07/2016 PCP: Prince Solian, MD    Brief Narrative:  81 year old female who presents with neck weakness. Patient does have significant past medical history of myasthenia gravis, who presents with 5 days of worsening weakness of the neck with difficulty holding her head upright, to the point that she had been using cervical collar to sustain her head. Symptoms associated with eyelid dropping, binocular double vision, dyspnea on exertion and swallowing difficulty. On initial physical examination, blood pressure 170/85, heart rate 97, respirations 16, temperature 98.3, oxygen saturation 94%. Dry mucous membranes, lungs were clear to auscultation bilaterally, no wheezing rales or rhonchi, heart S1-S2 present rhythmic, no gallops, rubs or murmurs, the abdomen was soft nontender, no lower extremity edema. Her forced vital capacity was 1.5. Sodium 136, potassium 4.0, chloride 102, bicarbonate 24, glucose 90, BUN 14, creatinine 1.46, white count 8.7, hemoglobin 13.8, hematocrit 41.9, platelets 287. Urinalysis negative for infection, chest x-ray with no rotation, good penetration, mild hypoinflation, right hemidiaphragm elevation, no effusions, infiltrates or pneumothorax. EKG, normal sinus rhythm, normal intervals, normal axis.  Patient was admitted to hospital with a working diagnosis of myasthenia gravis exacerbation.    Assessment & Plan:   Principal Problem:   Myasthenia gravis with (acute) exacerbation (HCC) Active Problems:   Acute exacerbation of myasthenia gravis ([place])  1. Myasthenia gravis exacerbation. Patient placed on IV IG per protocol, will keep in the step down unit while receiving infusion, for frequent neuro checking. Will continue to check NIF and FVC. Follow up with neurology recommendations. Continue pyridostigmine. Follow chest CT to evaluate thymoma.   2. Acute kidney injury due to dehydration. Will  continue hydration with isotonic saline, renal function with serum cr at 1,46, with K at 4.0 and serum bicarbonate at 24. Patient on IV IG potentially nephrotoxic. Will follow on renal panel in am. Avoid hypotension.   3. Asthma. Controlled and stable, no signs of exacerbation, will continue oxymetry monitoring and as needed supplemental 02 per . Continue Breo.   4. Depression. Patient with no altered mental status or depressed sensorium, will resume bupropion and clonazepam.    DVT prophylaxis: enoxaparin  Code Status: full Family Communication:  Disposition Plan: home   Consultants:   Neurology  Procedures:     Antimicrobials:       Subjective: No double vision or dysphagia, still using soft collar neck, no dyspnea or chest pain, no nausea or vomiting.   Objective: Vitals:   12/08/16 0235 12/08/16 0300 12/08/16 0740 12/08/16 0758  BP: (!) 154/78 (!) 148/67 138/65   Pulse: 90 87 78   Resp: (!) 23 14 14    Temp: 98.3 F (36.8 C) 98 F (36.7 C)    TempSrc: Oral Oral    SpO2: 97% 94% 93% 93%  Weight:      Height:        Intake/Output Summary (Last 24 hours) at 12/08/16 0845 Last data filed at 12/08/16 0600  Gross per 24 hour  Intake           865.83 ml  Output              450 ml  Net           415.83 ml   Filed Weights   12/08/16 0225  Weight: 68.2 kg (150 lb 4.8 oz)    Examination:  General: Not in pain or dyspnea Neurology: Awake and alert, non focal, upper extremity strength preserved.  E ENT: mild pallor, no icterus, oral mucosa moist Cardiovascular: No JVD. S1-S2 present, rhythmic, no gallops, rubs, or murmurs. No lower extremity edema. Pulmonary: vesicular breath sounds bilaterally, adequate air movement, no wheezing, rhonchi or rales. Gastrointestinal. Abdomen flat, no organomegaly, non tender, no rebound or guarding Skin. No rashes Musculoskeletal: no joint deformities     Data Reviewed: I have personally reviewed following labs and  imaging studies  CBC:  Recent Labs Lab 12/07/16 1530 12/07/16 2051  WBC 8.4 8.7  NEUTROABS  --  5.6  HGB 14.6 13.8  HCT 44.8 41.9  MCV 90.9 90.1  PLT 295 753   Basic Metabolic Panel:  Recent Labs Lab 12/07/16 1530 12/08/16 0351  NA 136  --   K 4.0  --   CL 102  --   CO2 24  --   GLUCOSE 90  --   BUN 14  --   CREATININE 1.46*  --   CALCIUM 9.2  --   MG  --  2.0  PHOS  --  3.3   GFR: Estimated Creatinine Clearance: 30.1 mL/min (A) (by C-G formula based on SCr of 1.46 mg/dL (H)). Liver Function Tests: No results for input(s): AST, ALT, ALKPHOS, BILITOT, PROT, ALBUMIN in the last 168 hours. No results for input(s): LIPASE, AMYLASE in the last 168 hours. No results for input(s): AMMONIA in the last 168 hours. Coagulation Profile: No results for input(s): INR, PROTIME in the last 168 hours. Cardiac Enzymes:  Recent Labs Lab 12/08/16 0351  TROPONINI <0.03   BNP (last 3 results) No results for input(s): PROBNP in the last 8760 hours. HbA1C: No results for input(s): HGBA1C in the last 72 hours. CBG:  Recent Labs Lab 12/08/16 0417 12/08/16 0747  GLUCAP 117* 105*   Lipid Profile: No results for input(s): CHOL, HDL, LDLCALC, TRIG, CHOLHDL, LDLDIRECT in the last 72 hours. Thyroid Function Tests:  Recent Labs  12/08/16 0351  TSH 2.007   Anemia Panel: No results for input(s): VITAMINB12, FOLATE, FERRITIN, TIBC, IRON, RETICCTPCT in the last 72 hours.    Radiology Studies: I have reviewed all of the imaging during this hospital visit personally     Scheduled Meds: . diclofenac sodium  2 g Topical TID AC & HS  . fluticasone furoate-vilanterol  1 puff Inhalation Daily   And  . umeclidinium bromide  1 puff Inhalation Daily  . Immune Globulin 10%  400 mg/kg Intravenous Q24 Hr x 5  . pyridostigmine  60 mg Oral Q8H   Continuous Infusions: . sodium chloride    . dextrose 5 % and 0.9% NaCl 100 mL/hr at 12/08/16 0313     LOS: 1 day         Tawni Millers, MD [facility] Pager [phone]

## 2016-12-08 NOTE — Progress Notes (Signed)
Subjective: Interval History: No major change from last night.  IVIG order and CT were missed in the orders.    No diplopia.  No dysphagia.  Complains of neck extensor weakness.  Mild SOB.    Objective: Vital signs in last 24 hours: Temp:  [98 F (36.7 C)-98.3 F (36.8 C)] 98 F (36.7 C) (10/13 0300) Pulse Rate:  [78-103] 78 (10/13 0740) Resp:  [13-23] 14 (10/13 0740) BP: (102-170)/(54-96) 138/65 (10/13 0740) SpO2:  [88 %-97 %] 93 % (10/13 0758) Weight:  [68.2 kg (150 lb 4.8 oz)] 68.2 kg (150 lb 4.8 oz) (10/13 0225)  Intake/Output from previous day: 10/12 0701 - 10/13 0700 In: 865.8 [I.V.:865.8] Out: 450 [Urine:450] Intake/Output this shift: No intake/output data recorded. Nutritional status: Diet NPO time specified  Neurologic Exam:  Awake, alert, fully oriented. Bilateral ptosis R>L. Language - normal. EOMI. PERL. Tongue midline. Strength 4/5 BUE and BLE most muscle groups. Coord- intact. Sensory- intact. Soft neck collar in place.  Lab Results:  Recent Labs  12/07/16 1530 12/07/16 2051  WBC 8.4 8.7  HGB 14.6 13.8  HCT 44.8 41.9  PLT 295 287  NA 136  --   K 4.0  --   CL 102  --   CO2 24  --   GLUCOSE 90  --   BUN 14  --   CREATININE 1.46*  --   CALCIUM 9.2  --    Lipid Panel No results for input(s): CHOL, TRIG, HDL, CHOLHDL, VLDL, LDLCALC in the last 72 hours.  Studies/Results: X-ray Chest Pa And Lateral  Result Date: 12/08/2016 CLINICAL DATA:  Shortness of breath EXAM: CHEST  2 VIEW COMPARISON:  Yesterday FINDINGS: Normal heart size and mediastinal contours. There is no edema, consolidation, effusion, or pneumothorax. EKG leads create artifact over the chest. No acute osseous finding. Cholecystectomy clips. IMPRESSION: No evidence of active disease. Electronically Signed   By: Monte Fantasia M.D.   On: 12/08/2016 08:03   Dg Chest 2 View  Result Date: 12/07/2016 CLINICAL DATA:  sob weakness EXAM: CHEST  2 VIEW COMPARISON:  03/16/16 FINDINGS: The  lungs are clear. The pulmonary vasculature is normal. Heart size is normal. Hilar and mediastinal contours are unremarkable. There is no pleural effusion. IMPRESSION: No active cardiopulmonary disease. Electronically Signed   By: Andreas Newport M.D.   On: 12/07/2016 21:21    Medications:  Scheduled: . diclofenac sodium  2 g Topical TID AC & HS  . fluticasone furoate-vilanterol  1 puff Inhalation Daily   And  . umeclidinium bromide  1 puff Inhalation Daily  . Immune Globulin 10%  400 mg/kg Intravenous Q24 Hr x 5    Assessment/Plan:  Acetylcholine receptor antibody positive Myasthenia Gravis with recent exacerbation mainly involving bilateral ptosis and head drop.  Has been on Mestinon, but no other immunomodulator.  She does have renal insufficiency which does complicate IVIG and CT contrast use, but does not preclude it.  We will hydrate her well and slow the infusion of IVIG as well.  Prefer if pharmacy would use Gammunex instead due to much lower sucrose and viscosity content.     LOS: 1 day   Rogue Jury, MD 12/08/2016  8:21 AM

## 2016-12-08 NOTE — Progress Notes (Signed)
I reviewed the CT Chest - No thymoma.    Rogue Jury, MD

## 2016-12-09 DIAGNOSIS — R0602 Shortness of breath: Secondary | ICD-10-CM

## 2016-12-09 LAB — BASIC METABOLIC PANEL
ANION GAP: 5 (ref 5–15)
BUN: 6 mg/dL (ref 6–20)
CALCIUM: 8.5 mg/dL — AB (ref 8.9–10.3)
CO2: 25 mmol/L (ref 22–32)
Chloride: 106 mmol/L (ref 101–111)
Creatinine, Ser: 0.96 mg/dL (ref 0.44–1.00)
GFR, EST NON AFRICAN AMERICAN: 55 mL/min — AB (ref >60)
Glucose, Bld: 97 mg/dL (ref 65–99)
Potassium: 3.7 mmol/L (ref 3.5–5.1)
Sodium: 136 mmol/L (ref 135–145)

## 2016-12-09 LAB — GLUCOSE, CAPILLARY: GLUCOSE-CAPILLARY: 90 mg/dL (ref 65–99)

## 2016-12-09 MED ORDER — BUPROPION HCL ER (SR) 100 MG PO TB12
200.0000 mg | ORAL_TABLET | Freq: Two times a day (BID) | ORAL | Status: DC
  Administered 2016-12-09: 200 mg via ORAL
  Filled 2016-12-09 (×4): qty 2

## 2016-12-09 MED ORDER — TRIMETHOPRIM 100 MG PO TABS
100.0000 mg | ORAL_TABLET | Freq: Every day | ORAL | Status: DC
  Administered 2016-12-09 – 2016-12-12 (×4): 100 mg via ORAL
  Filled 2016-12-09 (×4): qty 1

## 2016-12-09 NOTE — Progress Notes (Signed)
PROGRESS NOTE    Erica Drake  GQQ:[id number] DOB: 1935-12-08 DOA: 12/07/2016 PCP: Prince Solian, MD    Brief Narrative:  81 year old female who presents with neck weakness. Patient does have significant past medical history of myasthenia gravis, who presents with 5 days of worsening weakness of the neck with difficulty holding her head upright, to the point that she had been using cervical collar to sustain her head. Symptoms associated with eyelid dropping, binocular double vision, dyspnea on exertion and swallowing difficulty. On initial physical examination, blood pressure 170/85, heart rate 97, respirations 16, temperature 98.3, oxygen saturation 94%. Dry mucous membranes, lungs were clear to auscultation bilaterally, no wheezing rales or rhonchi, heart S1-S2 present rhythmic, no gallops, rubs or murmurs, the abdomen was soft nontender, no lower extremity edema. Her forced vital capacity was 1.5. Sodium 136, potassium 4.0, chloride 102, bicarbonate 24, glucose 90, BUN 14, creatinine 1.46, white count 8.7, hemoglobin 13.8, hematocrit 41.9, platelets 287. Urinalysis negative for infection, chest x-ray with no rotation, good penetration, mild hypoinflation, right hemidiaphragm elevation, no effusions, infiltrates or pneumothorax. EKG, normal sinus rhythm, normal intervals, normal axis.  Patient was admitted to hospital with a working diagnosis of myasthenia gravis exacerbation.    Assessment & Plan:   Principal Problem:   Myasthenia gravis with (acute) exacerbation (HCC) Active Problems:   Acute exacerbation of myasthenia gravis ([place])  1. Myasthenia gravis exacerbation. Patient responding well to IV IG per protocol. Transfer to neurology ward on remote telemetry. Will order NIF and FVC. Continue pyridostigmine. Chest CT with no thymoma. Will continue to follow up with neurology recommendations.    2. Acute kidney injury due to dehydration. Renal function has recovered, with serum cr  at 0,96, serum K at 3,7. Will follow on renal panel in am, hold IV fluids. Patient tolerating po well.   3. Asthma. Controlled and stable, no signs of exacerbation, continue Breo.   4. Depression. Tolerating well bupropion and clonazepam.    DVT prophylaxis: enoxaparin  Code Status: full Family Communication:  Disposition Plan: home   Consultants:   Neurology  Procedures:     Antimicrobials:       Subjective: Patient feeling much better, neck strength has improved, has been able to ambulate, no swallow dysfunction or double vision. No chest pain or dyspnea.   Objective: Vitals:   12/08/16 2000 12/08/16 2329 12/09/16 0405 12/09/16 0731  BP: (!) 147/61 (!) 151/71 (!) 156/68 138/78  Pulse: 98 88 98   Resp: 15 18 15    Temp: 98.6 F (37 C) 98 F (36.7 C) 98.4 F (36.9 C) 97.9 F (36.6 C)  TempSrc: Oral Oral Oral Oral  SpO2: 94% 95% 94%   Weight:      Height:        Intake/Output Summary (Last 24 hours) at 12/09/16 0841 Last data filed at 12/09/16 0500  Gross per 24 hour  Intake          2169.16 ml  Output              800 ml  Net          1369.16 ml   Filed Weights   12/08/16 0225  Weight: 68.2 kg (150 lb 4.8 oz)    Examination:   General: Not in pain or dyspnea, mild deconidtioning Neurology: Awake and alert, non focal  E ENT: no pallor, no icterus, oral mucosa moist Cardiovascular: No JVD. S1-S2 present, rhythmic, no gallops, rubs, or murmurs. No lower extremity edema. Pulmonary: vesicular  breath sounds bilaterally, adequate air movement, no wheezing, rhonchi or rales. Gastrointestinal. Abdomen flat, no organomegaly, non tender, no rebound or guarding Skin. No rashes Musculoskeletal: no joint deformities     Data Reviewed: I have personally reviewed following labs and imaging studies  CBC:  Recent Labs Lab 12/07/16 1530 12/07/16 2051  WBC 8.4 8.7  NEUTROABS  --  5.6  HGB 14.6 13.8  HCT 44.8 41.9  MCV 90.9 90.1  PLT 295 778    Basic Metabolic Panel:  Recent Labs Lab 12/07/16 1530 12/08/16 0351 12/09/16 0403  NA 136  --  136  K 4.0  --  3.7  CL 102  --  106  CO2 24  --  25  GLUCOSE 90  --  97  BUN 14  --  6  CREATININE 1.46*  --  0.96  CALCIUM 9.2  --  8.5*  MG  --  2.0  --   PHOS  --  3.3  --    GFR: Estimated Creatinine Clearance: 45.8 mL/min (by C-G formula based on SCr of 0.96 mg/dL). Liver Function Tests: No results for input(s): AST, ALT, ALKPHOS, BILITOT, PROT, ALBUMIN in the last 168 hours. No results for input(s): LIPASE, AMYLASE in the last 168 hours. No results for input(s): AMMONIA in the last 168 hours. Coagulation Profile: No results for input(s): INR, PROTIME in the last 168 hours. Cardiac Enzymes:  Recent Labs Lab 12/08/16 0351 12/08/16 0821 12/08/16 1459  TROPONINI <0.03 <0.03 <0.03   BNP (last 3 results) No results for input(s): PROBNP in the last 8760 hours. HbA1C: No results for input(s): HGBA1C in the last 72 hours. CBG:  Recent Labs Lab 12/08/16 0417 12/08/16 0747 12/09/16 0815  GLUCAP 117* 105* 90   Lipid Profile: No results for input(s): CHOL, HDL, LDLCALC, TRIG, CHOLHDL, LDLDIRECT in the last 72 hours. Thyroid Function Tests:  Recent Labs  12/08/16 0351  TSH 2.007   Anemia Panel: No results for input(s): VITAMINB12, FOLATE, FERRITIN, TIBC, IRON, RETICCTPCT in the last 72 hours.    Radiology Studies: I have reviewed all of the imaging during this hospital visit personally     Scheduled Meds: . buPROPion  200 mg Oral BID  . clonazePAM  1 mg Oral TID  . diclofenac sodium  2 g Topical TID AC & HS  . fluticasone furoate-vilanterol  1 puff Inhalation Daily   And  . umeclidinium bromide  1 puff Inhalation Daily  . Immune Globulin 10%  400 mg/kg Intravenous Q24 Hr x 5  . pyridostigmine  60 mg Oral Q8H   Continuous Infusions: . sodium chloride 50 mL/hr at 12/08/16 2307     LOS: 2 days        Tawni Millers, MD [facility] Pager [phone]

## 2016-12-09 NOTE — Progress Notes (Signed)
NURSING PROGRESS NOTE  Erica Drake [id number] Transfer Data: 12/09/2016 8:13 PM Attending Provider: Tawni Millers YTK:[id number], Ravisankar, MD Code Status: full  Allergies:  Cortisone; Amlodipine besylate; Chlorhexidine gluconate; Clindamycin; Dexamethasone; Duloxetine; Morphine sulfate; Niacin; Other; Prednisone; Rosuvastatin calcium; Statins; Sulfa antibiotics; Valsartan; and Strawberry extract Past Medical History:   has a past medical history of Allergic rhinitis; Chronic bronchitis; Chronic fatigue; Complication of anesthesia; Decreased hearing; Diabetes mellitus without complication ([place]); Eustachian tube dysfunction; Fibromyalgia; GERD (gastroesophageal reflux disease); myasthenia gravis; Interstitial cystitis; and Vertigo. Past Surgical History:   has a past surgical history that includes Nissen fundoplication; Cholecystectomy (N/A, 01/14/2015); and Knee arthroscopy with medial menisectomy (Left, 07/18/2016). Social History:   reports that she has quit smoking. She quit smokeless tobacco use about 42 years ago. She reports that she does not drink alcohol or use drugs.  Erica Drake is a 81 y.o. female patient transferred from 76m>  Blood pressure (!) 154/53, pulse 88, temperature 98 F (36.7 C), temperature source Oral, resp. rate 16, height 5' 4.5" (1.638 m), weight 67.9 kg (149 lb 9.6 oz), SpO2 95 %.  Cardiac Monitoring: Box # 37 in place. Cardiac monitor yields:normal sinus rhythm.  IV Fluids:  IV in place, occlusive dsg intact without redness IV push only, no IV fluids.   Skin: intact  Will continue to evaluate and treat per MD orders.   Joslyn Hy, MSN, RN, [employer]

## 2016-12-09 NOTE — Progress Notes (Signed)
Patient transferring to 5W.  Report called to receiving nurse. All questions answered. All personal belongings transferred with patient.

## 2016-12-09 NOTE — Progress Notes (Signed)
Patient performed NIF and VC with NIF being -42 cmH2O (X3), and VC 1.7L/M

## 2016-12-09 NOTE — Progress Notes (Signed)
Subjective: Interval History: S/P IVIG #1 yesterday.  Tolerated well.  Walked with PT well.  Took off cervical collar.   Still feels a little heavy.    CT Chest- No thymoma.    Objective: Vital signs in last 24 hours: Temp:  [97.8 F (36.6 C)-98.6 F (37 C)] 97.9 F (36.6 C) (10/14 0731) Pulse Rate:  [75-98] 98 (10/14 0405) Resp:  [13-20] 15 (10/14 0405) BP: (116-165)/(47-78) 138/78 (10/14 0731) SpO2:  [93 %-99 %] 94 % (10/14 0405)  Intake/Output from previous day: 10/13 0701 - 10/14 0700 In: 2169.2 [P.O.:480; I.V.:1689.2] Out: 800 [Urine:800] Intake/Output this shift: No intake/output data recorded. Nutritional status: Diet regular Room service appropriate? Yes; Fluid consistency: Thin  Neurologic Exam:  Awake, alert, fully oriented. Mild ptosis bilaterally. Face symmetrical. Holds head up OK. Extension of head is a bit difficult.  Strength 5/5 BUE and BLE.  Lab Results:  Recent Labs  12/07/16 1530 12/07/16 2051 12/09/16 0403  WBC 8.4 8.7  --   HGB 14.6 13.8  --   HCT 44.8 41.9  --   PLT 295 287  --   NA 136  --  136  K 4.0  --  3.7  CL 102  --  106  CO2 24  --  25  GLUCOSE 90  --  97  BUN 14  --  6  CREATININE 1.46*  --  0.96  CALCIUM 9.2  --  8.5*   Lipid Panel No results for input(s): CHOL, TRIG, HDL, CHOLHDL, VLDL, LDLCALC in the last 72 hours.  Studies/Results: X-ray Chest Pa And Lateral  Result Date: 12/08/2016 CLINICAL DATA:  Shortness of breath EXAM: CHEST  2 VIEW COMPARISON:  Yesterday FINDINGS: Normal heart size and mediastinal contours. There is no edema, consolidation, effusion, or pneumothorax. EKG leads create artifact over the chest. No acute osseous finding. Cholecystectomy clips. IMPRESSION: No evidence of active disease. Electronically Signed   By: Monte Fantasia M.D.   On: 12/08/2016 08:03   Dg Chest 2 View  Result Date: 12/07/2016 CLINICAL DATA:  sob weakness EXAM: CHEST  2 VIEW COMPARISON:  03/16/16 FINDINGS: The lungs are clear.  The pulmonary vasculature is normal. Heart size is normal. Hilar and mediastinal contours are unremarkable. There is no pleural effusion. IMPRESSION: No active cardiopulmonary disease. Electronically Signed   By: Andreas Newport M.D.   On: 12/07/2016 21:21   Ct Chest W Contrast  Result Date: 12/08/2016 CLINICAL DATA:  Myasthenia gravis, evaluate for thymoma EXAM: CT CHEST WITH CONTRAST TECHNIQUE: Multidetector CT imaging of the chest was performed during intravenous contrast administration. CONTRAST:  60mL ISOVUE-300 IOPAMIDOL (ISOVUE-300) INJECTION 61% COMPARISON:  None. FINDINGS: Cardiovascular: The heart is normal in size. No pericardial effusion. No evidence of thoracic aortic aneurysm. Mild atherosclerotic calcifications of the aortic arch. Mediastinum/Nodes: No evidence of anterior mediastinal mass/thymoma. Small mediastinal lymph nodes which do not meet pathologic CT size criteria. Visualized thyroid is unremarkable. Lungs/Pleura: Lungs are clear. No suspicious pulmonary nodules. Mild centrilobular emphysematous changes, upper lobe predominant. No focal consolidation. No pleural effusion or pneumothorax. Upper Abdomen: Visualized upper abdomen is notable for a small hiatal hernia with postsurgical changes related to Nissen fundoplication. Musculoskeletal: Degenerative changes of the visualized thoracolumbar spine. IMPRESSION: No evidence of anterior mediastinal mass/thymoma. No evidence of acute cardiopulmonary disease. Aortic Atherosclerosis (ICD10-I70.0) and Emphysema (ICD10-J43.9). Electronically Signed   By: Julian Hy M.D.   On: 12/08/2016 17:55    Medications:  Scheduled: . buPROPion  200 mg Oral BID  .  clonazePAM  1 mg Oral TID  . diclofenac sodium  2 g Topical TID AC & HS  . fluticasone furoate-vilanterol  1 puff Inhalation Daily   And  . umeclidinium bromide  1 puff Inhalation Daily  . Immune Globulin 10%  400 mg/kg Intravenous Q24 Hr x 5  . pyridostigmine  60 mg Oral Q8H     Assessment/Plan:  Myasthenia Gravis exacerbation - Cont IVIG for another 4 days including today.  Cont Mestinon.  Will have to add an immunomodulator before discharge.  Did not tolerate Prednisone in the past.     Patient does not need ICU care at this time.  From my point of view, OK to transfer to regular neuro floor and allow to shower.     LOS: 2 days   Rogue Jury, MD 12/09/2016  8:23 AM

## 2016-12-09 NOTE — Progress Notes (Signed)
RT did NIF and VC with patient. Patient got a NIF -30 and VC 1.4L. Patient tolerated well.

## 2016-12-10 LAB — BASIC METABOLIC PANEL
Anion gap: 7 (ref 5–15)
BUN: 9 mg/dL (ref 6–20)
CHLORIDE: 104 mmol/L (ref 101–111)
CO2: 27 mmol/L (ref 22–32)
Calcium: 8.8 mg/dL — ABNORMAL LOW (ref 8.9–10.3)
Creatinine, Ser: 1.03 mg/dL — ABNORMAL HIGH (ref 0.44–1.00)
GFR calc Af Amer: 59 mL/min — ABNORMAL LOW (ref >60)
GFR calc non Af Amer: 51 mL/min — ABNORMAL LOW (ref >60)
Glucose, Bld: 80 mg/dL (ref 65–99)
POTASSIUM: 3.5 mmol/L (ref 3.5–5.1)
SODIUM: 138 mmol/L (ref 135–145)

## 2016-12-10 LAB — GLUCOSE, CAPILLARY: GLUCOSE-CAPILLARY: 85 mg/dL (ref 65–99)

## 2016-12-10 MED ORDER — BUPROPION HCL ER (SR) 100 MG PO TB12: 200.0000 mg | ORAL_TABLET | Freq: Two times a day (BID) | ORAL | Status: DC

## 2016-12-10 MED ORDER — BUPROPION HCL ER (SR) 100 MG PO TB12: 100.0000 mg | ORAL_TABLET | Freq: Two times a day (BID) | ORAL | Status: DC

## 2016-12-10 MED ORDER — ACETAMINOPHEN 325 MG PO TABS
650.0000 mg | ORAL_TABLET | Freq: Four times a day (QID) | ORAL | Status: DC | PRN
  Administered 2016-12-10 – 2016-12-11 (×2): 650 mg via ORAL
  Filled 2016-12-10 (×2): qty 2

## 2016-12-10 MED ORDER — BUPROPION HCL ER (SR) 100 MG PO TB12
200.0000 mg | ORAL_TABLET | Freq: Two times a day (BID) | ORAL | Status: DC
  Administered 2016-12-10 – 2016-12-12 (×5): 200 mg via ORAL
  Filled 2016-12-10 (×6): qty 2

## 2016-12-10 NOTE — Progress Notes (Signed)
NIF -22 FVC 1.4L  Pt tolerated well.  RT will continue to monitor.

## 2016-12-10 NOTE — Progress Notes (Signed)
PROGRESS NOTE    Erica Drake  TIW:[id number] DOB: 1935/11/26 DOA: 12/07/2016 PCP: Prince Solian, MD    Brief Narrative:  81 year old female who presents with neck weakness. Patient does have significant past medical history of myasthenia gravis, who presents with 5 days of worsening weakness of the neck with difficulty holding her head upright, to the point that she had been using cervical collar to sustain her head. Symptoms associated with eyelid dropping, binocular double vision, dyspnea on exertion and swallowing difficulty. On initial physical examination, blood pressure 170/85, heart rate 97, respirations 16, temperature 98.3, oxygen saturation 94%. Dry mucous membranes, lungs were clear to auscultation bilaterally, no wheezing rales or rhonchi, heart S1-S2 present rhythmic, no gallops, rubs or murmurs, the abdomen was soft nontender, no lower extremity edema. Her forced vital capacity was 1.5. Sodium 136, potassium 4.0, chloride 102, bicarbonate 24, glucose 90, BUN 14, creatinine 1.46, white count 8.7, hemoglobin 13.8, hematocrit 41.9, platelets 287. Urinalysis negative for infection, chest x-ray with no rotation, good penetration, mild hypoinflation, right hemidiaphragm elevation, no effusions, infiltrates or pneumothorax. EKG, normal sinus rhythm, normal intervals, normal axis.  Patient was admitted to hospital with a working diagnosis of myasthenia gravis exacerbation.    Assessment & Plan:   Principal Problem:   Myasthenia gravis with (acute) exacerbation (HCC) Active Problems:   Acute exacerbation of myasthenia gravis ([place])   1. Myasthenia gravis exacerbation. Continue IV IG per protocol, symptoms have improved, patient with no dyspnea, dysphagia or diplopia. NIF negative 30 and VC 1.4 liters. Plan to complete 5 days of IV IG. Case discussed with neurology.   2. Acute kidney injury due to dehydration. Stable renal function, with serum cr at 1.03 with K at 3,5 and Na at  130. Patient tolerating po well. Will follow renal panel in am, IVIG potentially nephrotoxic.    3. Asthma. Continue to be controlled and stable,on Breo.   4. Depression. On bupropion and clonazepam, per her home regimen.    DVT prophylaxis:enoxaparin Code Status:full Family Communication: Disposition Plan:home   Consultants:  Neurology  Procedures:    Antimicrobials:      Subjective: Patient feeling better, no dyspnea, or neck weakness, no double vision or dysphagia.   Objective: Vitals:   12/09/16 1800 12/09/16 1934 12/09/16 1944 12/10/16 0617  BP: (!) 163/75  (!) 154/53 106/86  Pulse:   88 80  Resp:   16 18  Temp: (!) 97.3 F (36.3 C)  98 F (36.7 C) 97.6 F (36.4 C)  TempSrc: Axillary  Oral Oral  SpO2:   95% 94%  Weight:  67.9 kg (149 lb 9.6 oz)    Height:  5' 4.5" (1.638 m)      Intake/Output Summary (Last 24 hours) at 12/10/16 0913 Last data filed at 12/10/16 0002  Gross per 24 hour  Intake            562.3 ml  Output              554 ml  Net              8.3 ml   Filed Weights   12/08/16 0225 12/09/16 1934  Weight: 68.2 kg (150 lb 4.8 oz) 67.9 kg (149 lb 9.6 oz)    Examination:   General: Not in pain or dyspnea Neurology: Awake and alert, non focal  E ENT: no pallor, no icterus, oral mucosa moist Cardiovascular: No JVD. S1-S2 present, rhythmic, no gallops, rubs, or murmurs. No lower extremity edema. Pulmonary:  vesicular breath sounds bilaterally, adequate air movement, no wheezing, rhonchi or rales. Gastrointestinal. Abdomen flat, no organomegaly, non tender, no rebound or guarding Skin. No rashes Musculoskeletal: no joint deformities     Data Reviewed: I have personally reviewed following labs and imaging studies  CBC:  Recent Labs Lab 12/07/16 1530 12/07/16 2051  WBC 8.4 8.7  NEUTROABS  --  5.6  HGB 14.6 13.8  HCT 44.8 41.9  MCV 90.9 90.1  PLT 295 681   Basic Metabolic Panel:  Recent Labs Lab  12/07/16 1530 12/08/16 0351 12/09/16 0403 12/10/16 0420  NA 136  --  136 138  K 4.0  --  3.7 3.5  CL 102  --  106 104  CO2 24  --  25 27  GLUCOSE 90  --  97 80  BUN 14  --  6 9  CREATININE 1.46*  --  0.96 1.03*  CALCIUM 9.2  --  8.5* 8.8*  MG  --  2.0  --   --   PHOS  --  3.3  --   --    GFR: Estimated Creatinine Clearance: 43.1 mL/min (A) (by C-G formula based on SCr of 1.03 mg/dL (H)). Liver Function Tests: No results for input(s): AST, ALT, ALKPHOS, BILITOT, PROT, ALBUMIN in the last 168 hours. No results for input(s): LIPASE, AMYLASE in the last 168 hours. No results for input(s): AMMONIA in the last 168 hours. Coagulation Profile: No results for input(s): INR, PROTIME in the last 168 hours. Cardiac Enzymes:  Recent Labs Lab 12/08/16 0351 12/08/16 0821 12/08/16 1459  TROPONINI <0.03 <0.03 <0.03   BNP (last 3 results) No results for input(s): PROBNP in the last 8760 hours. HbA1C: No results for input(s): HGBA1C in the last 72 hours. CBG:  Recent Labs Lab 12/08/16 0417 12/08/16 0747 12/09/16 0815 12/10/16 0819  GLUCAP 117* 105* 90 85   Lipid Profile: No results for input(s): CHOL, HDL, LDLCALC, TRIG, CHOLHDL, LDLDIRECT in the last 72 hours. Thyroid Function Tests:  Recent Labs  12/08/16 0351  TSH 2.007   Anemia Panel: No results for input(s): VITAMINB12, FOLATE, FERRITIN, TIBC, IRON, RETICCTPCT in the last 72 hours.    Radiology Studies: I have reviewed all of the imaging during this hospital visit personally     Scheduled Meds: . buPROPion  200 mg Oral BID  . clonazePAM  1 mg Oral TID  . diclofenac sodium  2 g Topical TID AC & HS  . fluticasone furoate-vilanterol  1 puff Inhalation Daily   And  . umeclidinium bromide  1 puff Inhalation Daily  . Immune Globulin 10%  400 mg/kg Intravenous Q24 Hr x 5  . pyridostigmine  60 mg Oral Q8H  . trimethoprim  100 mg Oral Daily   Continuous Infusions:   LOS: 3 days        Mauricio Gerome Apley, MD [facility] Pager [phone]

## 2016-12-10 NOTE — Progress Notes (Signed)
RT did NIF and VC with patient. Patient got a NIF -30 and VC 1.6L. Patient tolerated well.

## 2016-12-11 LAB — BASIC METABOLIC PANEL
ANION GAP: 6 (ref 5–15)
BUN: 9 mg/dL (ref 6–20)
CHLORIDE: 104 mmol/L (ref 101–111)
CO2: 26 mmol/L (ref 22–32)
Calcium: 8.8 mg/dL — ABNORMAL LOW (ref 8.9–10.3)
Creatinine, Ser: 1.02 mg/dL — ABNORMAL HIGH (ref 0.44–1.00)
GFR calc non Af Amer: 51 mL/min — ABNORMAL LOW (ref >60)
GFR, EST AFRICAN AMERICAN: 59 mL/min — AB (ref >60)
Glucose, Bld: 90 mg/dL (ref 65–99)
POTASSIUM: 3.5 mmol/L (ref 3.5–5.1)
Sodium: 136 mmol/L (ref 135–145)

## 2016-12-11 LAB — GLUCOSE, CAPILLARY: GLUCOSE-CAPILLARY: 80 mg/dL (ref 65–99)

## 2016-12-11 NOTE — Progress Notes (Signed)
NIF: -40 °VC: 1.5L ° °Pt had good effort  °

## 2016-12-11 NOTE — Progress Notes (Signed)
RT did VC and NIF with patient. Patient got NIF -30 and VC 1.4L. Patient tolerated well.

## 2016-12-11 NOTE — Consult Note (Signed)
   [facility] CM Inpatient Consult   12/11/2016  Erica Drake 07-05-1935 [id number]  Patient evaluated for community based chronic disease management services with [place] [facility] as a benefit of patient's Medicare Insurance. Spoke with patient at bedside to explain [place] Management services. Patient states she and her husband has hired help in the home.  She states she would like the follow up for post hospital care management. Consent form signed and copies of the [facility] folder given with contact information.  Patient endorses Dr. Prince Solian, at [facility].  Patient will receive post hospital discharge call and will be evaluated for monthly home visits for assessments and disease process education.  Left contact information and [facility] literature at bedside. Made Inpatient Case Manager aware that [place] Management following. Of note, [facility] Care Management services does not replace or interfere with any services that are arranged by inpatient case management or social work.  For additional questions or referrals please contact:    Natividad Brood, RN BSN [facility] Liaison  [phone] business mobile phone Toll free office [phone]

## 2016-12-11 NOTE — Progress Notes (Signed)
Neurology Progress Note   S:// Patient seen and examined. Continues to complain of ptosis and diplopia  O:// Current vital signs: BP (!) 145/64 (BP Location: Right Arm)   Pulse 88   Temp 98.5 F (36.9 C) (Oral)   Resp 18   Ht 5' 4.5" (1.638 m)   Wt 67.9 kg (149 lb 9.6 oz)   SpO2 94%   BMI 25.28 kg/m   Vital signs in last 24 hours: Temp:  [97.1 F (36.2 C)-99 F (37.2 C)] 98.5 F (36.9 C) (10/16 0502) Pulse Rate:  [78-95] 88 (10/16 0502) Resp:  [17-18] 18 (10/16 0502) BP: (133-164)/(54-89) 145/64 (10/16 0502) SpO2:  [94 %-100 %] 94 % (10/16 0502) GENERAL: Awake, alert in NAD HEENT: - Normocephalic and atraumatic, dry mm, no LN++, no Thyromegally LUNGS - Clear to auscultation bilaterally with no wheezes CV - S1S2 RRR, no m/r/g, equal pulses bilaterally. ABDOMEN - Soft, nontender, nondistended with normoactive BS Ext: warm, well perfused, intact peripheral pulses,no edema NEURO:  Mental Status: AA&Ox3  Language: speech is clear.  Naming, repetition, fluency, and comprehension intact. Cranial Nerves: PERRL 62mm/brisk. EOMI but complains of diplopia at either end gaze, also diplopia with prolonged upgaze, mild bilateral ptosis, visual fields full, no facial asymmetry,facial sensation intact, hearing intact, tongue/uvula/soft palate midline, normal sternocleidomastoid and trapezius muscle strength. No evidence of tongue atrophy or fibrillations Motor: 5/5 b/l UE and LE. Neck flexors3/5 and extension 2/5 Tone: is normal and bulk is normal Sensation- Intact to light touch bilaterally Coordination: FTN intact bilaterally, no ataxia in BLE. Gait- deferred  MEDS  Current Facility-Administered Medications:  .  acetaminophen (TYLENOL) tablet 650 mg, 650 mg, Oral, Q6H PRN, Arrien, Jimmy Picket, MD, 650 mg at 12/10/16 1003 .  clonazePAM (KLONOPIN) tablet 1 mg, 1 mg, Oral, TID, Arrien, Jimmy Picket, MD, 1 mg at 12/10/16 2132 .  diclofenac sodium (VOLTAREN) 1 % transdermal gel 2  g, 2 g, Topical, TID AC & HS, Osei-Bonsu, George, MD, 2 g at 12/08/16 0943 .  fluticasone furoate-vilanterol (BREO ELLIPTA) 100-25 MCG/INH 1 puff, 1 puff, Inhalation, Daily, 1 puff at 12/10/16 1243 **AND** umeclidinium bromide (INCRUSE ELLIPTA) 62.5 MCG/INH 1 puff, 1 puff, Inhalation, Daily, Osei-Bonsu, George, MD, 1 puff at 12/10/16 1242 .  Immune Globulin 10% (PRIVIGEN) IV infusion 25 g, 400 mg/kg, Intravenous, Q24 Hr x 5, Jalene Mullet, RPH, Stopped at 12/10/16 1108 .  LORazepam (ATIVAN) injection 0.5 mg, 0.5 mg, Intravenous, Q6H PRN, Osei-Bonsu, George, MD, 0.5 mg at 12/09/16 0042 .  pyridostigmine (MESTINON) tablet 60 mg, 60 mg, Oral, Q8H, Eshraghi, Shervin, MD, 60 mg at 12/11/16 0534 .  trimethoprim (TRIMPEX) tablet 100 mg, 100 mg, Oral, Daily, Arrien, Jimmy Picket, MD, 100 mg at 12/10/16 0945 .  Wellbutrin SR 200 mg tablet (pt. supplied), 200 mg, Oral, BID, Blenda Nicely, RPH, 200 mg at 12/10/16 2132 Labs CBC    Component Value Date/Time   WBC 8.7 12/07/2016 2051   RBC 4.65 12/07/2016 2051   HGB 13.8 12/07/2016 2051   HCT 41.9 12/07/2016 2051   PLT 287 12/07/2016 2051   MCV 90.1 12/07/2016 2051   MCH 29.7 12/07/2016 2051   MCHC 32.9 12/07/2016 2051   RDW 14.4 12/07/2016 2051   LYMPHSABS 2.1 12/07/2016 2051   MONOABS 0.7 12/07/2016 2051   EOSABS 0.2 12/07/2016 2051   BASOSABS 0.0 12/07/2016 2051    CMP     Component Value Date/Time   NA 136 12/11/2016 0423   NA 142 08/26/2015 1153   K  3.5 12/11/2016 0423   CL 104 12/11/2016 0423   CO2 26 12/11/2016 0423   GLUCOSE 90 12/11/2016 0423   BUN 9 12/11/2016 0423   BUN 20 08/26/2015 1153   CREATININE 1.02 (H) 12/11/2016 0423   CALCIUM 8.8 (L) 12/11/2016 0423   PROT 6.6 12/01/2015 1135   ALBUMIN 3.4 (L) 12/01/2015 1135   AST 33 12/01/2015 1135   ALT 15 12/01/2015 1135   ALKPHOS 66 12/01/2015 1135   BILITOT 1.2 12/01/2015 1135   GFRNONAA 51 (L) 12/11/2016 0423   GFRAA 59 (L) 12/11/2016 0423   Imaging I have  reviewed images in epic and the results pertinent to this consultation are: CT-scan of the brain - no acute changes CT chest - no thymoma per report.  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 Current complaints are neck muscle weakness, ptosis and diplopia. Reassuring respiratory tests at this time.  Recs: -c/w 5d IVIG -c/w Mestinon at home dose. -Will d/w outpatient neuromuscular specialist Dr. Posey Pronto RE: steroids vs steroid sparing agent for long term disease modifying treatment. Per Dr. Serita Grit note, patient has not tolerated steroids well in the past. -c/w NIFs and FVC at least q8h  -- Amie Portland, MD [facility] [phone]  If 7pm to 7am, please call on call as listed on AMION.

## 2016-12-11 NOTE — Progress Notes (Signed)
PROGRESS NOTE    Erica Drake  RDE:[id number] DOB: 31-Jul-1935 DOA: 12/07/2016 PCP: Prince Solian, MD    Brief Narrative:  81 year old female who presents with neck weakness. Patient does have significant past medical history of myasthenia gravis, who presents with 5 days of worsening weakness of the neck with difficulty holding her head upright, to the point that she had been using cervical collar to sustain her head. Symptoms associated with eyelid dropping, binocular double vision, dyspnea on exertion and swallowing difficulty. On initial physical examination, blood pressure 170/85, heart rate 97, respirations 16, temperature 98.3, oxygen saturation 94%. Dry mucous membranes, lungs were clear to auscultation bilaterally, no wheezing rales or rhonchi, heart S1-S2 present rhythmic, no gallops, rubs or murmurs, the abdomen was soft nontender, no lower extremity edema. Her forced vital capacity was 1.5. Sodium 136, potassium 4.0, chloride 102, bicarbonate 24, glucose 90, BUN 14, creatinine 1.46, white count 8.7, hemoglobin 13.8, hematocrit 41.9, platelets 287. Urinalysis negative for infection, chest x-ray with no rotation, good penetration, mild hypoinflation, right hemidiaphragm elevation, no effusions, infiltrates or pneumothorax. EKG, normal sinus rhythm, normal intervals, normal axis.  Patient was admitted to hospital with a working diagnosis of myasthenia gravis exacerbation  Assessment & Plan:   Principal Problem:   Myasthenia gravis with (acute) exacerbation ([place]) Active Problems:   Acute exacerbation of myasthenia gravis ([place])  1. Myasthenia gravis exacerbation. Tolerating well IV IG per protocol, with improvement of weakness. NIF negative 30 and VC 1.6 liters. Plan to discharge home in am, after last dose of IV IG, and follow with neuromuscular specialist as outpatient. Continue mestinon, patient did not tolerate prednisone in the past. Chest CT with no thymoma.   2. Acute  kidney injury due to dehydration. Renal function stable, will continue to monitor renal panel while patient on IV IG.   3. Asthma. Continue Breo, stable with no signs of exacerbation.   4. Depression. Tolerating well bupropion and clonazepam.    DVT prophylaxis:enoxaparin Code Status:full Family Communication: Disposition Plan:home   Consultants:  Neurology  Procedures:    Antimicrobials:     Subjective: Patient feeling well, no chest pain or dyspnea, has been out of bed with no complications, no dysphagia or double vision, no neck weakness, but mild blurry vision due to eyelid lag.   Objective: Vitals:   12/10/16 1700 12/10/16 1844 12/10/16 2251 12/11/16 0502  BP: (!) 144/54 (!) 159/81 (!) 164/89 (!) 145/64  Pulse: 82 79 95 88  Resp:   18 18  Temp:  98.5 F (36.9 C) 98.4 F (36.9 C) 98.5 F (36.9 C)  TempSrc:  Oral Oral Oral  SpO2:  100% 94% 94%  Weight:      Height:        Intake/Output Summary (Last 24 hours) at 12/11/16 1108 Last data filed at 12/11/16 1004  Gross per 24 hour  Intake              680 ml  Output                0 ml  Net              680 ml   Filed Weights   12/08/16 0225 12/09/16 1934  Weight: 68.2 kg (150 lb 4.8 oz) 67.9 kg (149 lb 9.6 oz)    Examination:   General: Not in pain or dyspnea Neurology: Awake and alert, non focal. Preserved strength proximal and distal.    E ENT: mild pallor, no icterus, oral  mucosa moist Cardiovascular: No JVD. S1-S2 present, rhythmic, no gallops, rubs, or murmurs. No lower extremity edema. Pulmonary: vesicular breath sounds bilaterally, adequate air movement, no wheezing, rhonchi or rales. Gastrointestinal. Abdomen flat, no organomegaly, non tender, no rebound or guarding Skin. No rashes Musculoskeletal: no joint deformities     Data Reviewed: I have personally reviewed following labs and imaging studies  CBC:  Recent Labs Lab 12/07/16 1530 12/07/16 2051  WBC 8.4 8.7    NEUTROABS  --  5.6  HGB 14.6 13.8  HCT 44.8 41.9  MCV 90.9 90.1  PLT 295 161   Basic Metabolic Panel:  Recent Labs Lab 12/07/16 1530 12/08/16 0351 12/09/16 0403 12/10/16 0420 12/11/16 0423  NA 136  --  136 138 136  K 4.0  --  3.7 3.5 3.5  CL 102  --  106 104 104  CO2 24  --  25 27 26   GLUCOSE 90  --  97 80 90  BUN 14  --  6 9 9   CREATININE 1.46*  --  0.96 1.03* 1.02*  CALCIUM 9.2  --  8.5* 8.8* 8.8*  MG  --  2.0  --   --   --   PHOS  --  3.3  --   --   --    GFR: Estimated Creatinine Clearance: 43.6 mL/min (A) (by C-G formula based on SCr of 1.02 mg/dL (H)). Liver Function Tests: No results for input(s): AST, ALT, ALKPHOS, BILITOT, PROT, ALBUMIN in the last 168 hours. No results for input(s): LIPASE, AMYLASE in the last 168 hours. No results for input(s): AMMONIA in the last 168 hours. Coagulation Profile: No results for input(s): INR, PROTIME in the last 168 hours. Cardiac Enzymes:  Recent Labs Lab 12/08/16 0351 12/08/16 0821 12/08/16 1459  TROPONINI <0.03 <0.03 <0.03   BNP (last 3 results) No results for input(s): PROBNP in the last 8760 hours. HbA1C: No results for input(s): HGBA1C in the last 72 hours. CBG:  Recent Labs Lab 12/08/16 0417 12/08/16 0747 12/09/16 0815 12/10/16 0819 12/11/16 0808  GLUCAP 117* 105* 90 85 80   Lipid Profile: No results for input(s): CHOL, HDL, LDLCALC, TRIG, CHOLHDL, LDLDIRECT in the last 72 hours. Thyroid Function Tests: No results for input(s): TSH, T4TOTAL, FREET4, T3FREE, THYROIDAB in the last 72 hours. Anemia Panel: No results for input(s): VITAMINB12, FOLATE, FERRITIN, TIBC, IRON, RETICCTPCT in the last 72 hours.    Radiology Studies: I have reviewed all of the imaging during this hospital visit personally     Scheduled Meds: . clonazePAM  1 mg Oral TID  . diclofenac sodium  2 g Topical TID AC & HS  . fluticasone furoate-vilanterol  1 puff Inhalation Daily   And  . umeclidinium bromide  1 puff  Inhalation Daily  . Immune Globulin 10%  400 mg/kg Intravenous Q24 Hr x 5  . pyridostigmine  60 mg Oral Q8H  . trimethoprim  100 mg Oral Daily  . buPROPion  200 mg Oral BID   Continuous Infusions:   LOS: 4 days        Roscoe Witts Gerome Apley, MD [facility] Pager [phone]

## 2016-12-12 DIAGNOSIS — G7001 Myasthenia gravis with (acute) exacerbation: Principal | ICD-10-CM

## 2016-12-12 LAB — BASIC METABOLIC PANEL
ANION GAP: 6 (ref 5–15)
BUN: 11 mg/dL (ref 6–20)
CALCIUM: 9 mg/dL (ref 8.9–10.3)
CO2: 26 mmol/L (ref 22–32)
CREATININE: 1.1 mg/dL — AB (ref 0.44–1.00)
Chloride: 104 mmol/L (ref 101–111)
GFR, EST AFRICAN AMERICAN: 54 mL/min — AB (ref >60)
GFR, EST NON AFRICAN AMERICAN: 47 mL/min — AB (ref >60)
Glucose, Bld: 81 mg/dL (ref 65–99)
Potassium: 3.9 mmol/L (ref 3.5–5.1)
SODIUM: 136 mmol/L (ref 135–145)

## 2016-12-12 LAB — GLUCOSE, CAPILLARY: GLUCOSE-CAPILLARY: 155 mg/dL — AB (ref 65–99)

## 2016-12-12 NOTE — Progress Notes (Signed)
Erica Drake to be D/C'd Home per MD order.  Discussed with the patient and all questions fully answered.  VSS, Skin clean, dry and intact without evidence of skin break down, no evidence of skin tears noted. IV catheter discontinued intact. Site without signs and symptoms of complications. Dressing and pressure applied.  An After Visit Summary was printed and given to the patient. Patient received prescription.  D/c education completed with patient/family including follow up instructions, medication list, d/c activities limitations if indicated, with other d/c instructions as indicated by MD - patient able to verbalize understanding, all questions fully answered.   Patient instructed to return to ED, call 911, or call MD for any changes in condition.   Patient escorted via [place], and D/C home via private auto.  Luci Bank 12/12/2016 4:38 PM

## 2016-12-12 NOTE — Progress Notes (Signed)
NIF -30 and VC 1.6 L/min

## 2016-12-12 NOTE — Progress Notes (Signed)
Physician Discharge Summary  Erica Drake  NLZ:[id number]  DOB: [birth year]  DOA: 12/07/2016 PCP: Prince Solian, MD  Admit date: 12/07/2016 Discharge date: 12/12/2016  Admitted From: Home  Disposition: Home   Recommendations for Outpatient Follow-up:  1. Follow up with PCP in 1 week  2. Please follow up with neurology in 1-2 weeks   3. Please obtain BMP/CBC in one week  Discharge Condition: Stable   CODE STATUS: FULL  Diet recommendation: Heart Healthy   Brief/Interim Summary: For full details see H&P and progress notes but in brief, 81 year old female with medical history of myasthenia gravis presented with5 days of worsening weakness of the neck, difficulty holding her head upright and blurry vision. Patient was admitted to the hospital with working diagnosis of myasthenia gravis exacerbation, neurology was consulted and she was started on IVIG. Patient clinically improved and completed IVIG protocol. She will be discharged to follow-up with neuromuscular specialist as an outpatient. Patient was prescribed prednisone last she did not tolerate this in the past.   Subjective: Patient seen and examined, feeling well, denies chest pain, shortness of breath and palpitations. Still mild induration and mild arm weakness but significant improvement. Patient remains afebrile and tolerating diet well.  Discharge Diagnoses/Hospital Course:  Myasthenia gravis exacerbation - completed IVIG protocol with significant improvement. During hospital stay neurology was following the patient. Patient was not started on prednisone as she did not tolerate this in the past. Chest CT with no thymoma. Patient will be discharged on Mestinon and to follow-up with neuromuscular specialist as an outpatient.  Acute kidney injury due to dehydration Renal function stable Monitor in one week with PCP  Asthma - stable during hospital stay   Depression. No SI/HI - Stable   All other chronic medical  condition were stable during the hospitalization. On the day of the discharge the patient's vitals were stable, and no other acute medical condition were reported by patient. Patient was felt safe to be discharge to home   Discharge Instructions  You were cared for by a hospitalist during your hospital stay. If you have any questions about your discharge medications or the care you received while you were in the hospital after you are discharged, you can call the unit and asked to speak with the hospitalist on call if the hospitalist that took care of you is not available. Once you are discharged, your primary care physician will handle any further medical issues. Please note that NO REFILLS for any discharge medications will be authorized once you are discharged, as it is imperative that you return to your primary care physician (or establish a relationship with a primary care physician if you do not have one) for your aftercare needs so that they can reassess your need for medications and monitor your lab values.  Discharge Instructions    AMB Referral to [facility]    Complete by:  As directed    Reason for consult:  Transition of care follow up   Expected date of contact:  1-3 days (reserved for hospital discharges)   Please assign to community nurse for transition of care calls and assess for home visits. Questions please call:   Natividad Brood, RN BSN [facility]iaison  [phone] business mobile phone Toll free office [phone]   Call MD for:  difficulty breathing, headache or visual disturbances    Complete by:  As directed    Call MD for:  extreme fatigue    Complete by:  As directed    Call MD for:  hives    Complete by:  As directed    Call MD for:  persistant dizziness or light-headedness    Complete by:  As directed    Call MD for:  persistant nausea and vomiting    Complete by:  As directed    Call MD for:  redness, tenderness, or signs of  infection (pain, swelling, redness, odor or green/yellow discharge around incision site)    Complete by:  As directed    Call MD for:  severe uncontrolled pain    Complete by:  As directed    Call MD for:  temperature >100.4    Complete by:  As directed    Diet - low sodium heart healthy    Complete by:  As directed    Increase activity slowly    Complete by:  As directed      Allergies as of 12/12/2016      Reactions   Cortisone Shortness Of Breath, Swelling, Other (See Comments)   Tongue swelling   Amlodipine Besylate Cough   Chlorhexidine Gluconate Itching   Skin on face turned red   Clindamycin Itching   Dexamethasone Other (See Comments)   Per MD   Duloxetine Other (See Comments)   Unknown reaction   Morphine Sulfate Other (See Comments)   Unknown reaction   Niacin Other (See Comments)   Other Other (See Comments)   Pollen - nasal reaction   Prednisone Other (See Comments)   Causes stomach pain. Sores in her mouth.   Rosuvastatin Calcium Other (See Comments)   Muscle aches   Statins Other (See Comments)   Muscle aches   Sulfa Antibiotics Other (See Comments)   May possibly have caused deafness in one ear   Valsartan Other (See Comments)   Unknown reaction   Strawberry Extract Itching, Rash      Medication List    TAKE these medications   buPROPion 200 MG 12 hr tablet Commonly known as:  WELLBUTRIN SR Take 200 mg by mouth 2 (two) times daily after a meal.   clonazePAM 1 MG tablet Commonly known as:  KLONOPIN Take 1 tablet by mouth See admin instructions. Take 1 tablet (1 mg) three times daily - morning, after supper and before bed   clonazePAM 1 MG disintegrating tablet Commonly known as:  KLONOPIN Take 1 mg by mouth See admin instructions. Dissolve 1 tablet (1 mg) under the tongue daily after going to bed; also dissolve 1 tablet (1 mg) on Tuesdays and Thursdays prior to going to doctor's appointment for sodium bicarb treatments.    Fluticasone-Umeclidin-Vilant 100-62.5-25 MCG/INH Aepb Commonly known as:  TRELEGY ELLIPTA Inhale 1 puff into the lungs daily.   HYDROcodone-acetaminophen 5-325 MG tablet Commonly known as:  NORCO Take 1-2 tablets by mouth every 4 (four) hours as needed for moderate pain.   methocarbamol 500 MG tablet Commonly known as:  ROBAXIN Take 1 tablet (500 mg total) by mouth 4 (four) times daily. As needed for muscle spasm   PRESCRIPTION MEDICATION Inhale into the lungs at bedtime. CPAP   PROAIR HFA 108 (90 Base) MCG/ACT inhaler Generic drug:  albuterol Inhale 1-2 puffs into the lungs every 4 (four) hours as needed for wheezing or shortness of breath.   albuterol (2.5 MG/3ML) 0.083% nebulizer solution Commonly known as:  PROVENTIL Take 3 mLs (2.5 mg total) by nebulization every 6 (six) hours as needed for wheezing or shortness of breath.   pyridostigmine 60  MG tablet Commonly known as:  MESTINON Take 1 tablet (60 mg total) by mouth 3 (three) times daily.   trimethoprim 100 MG tablet Commonly known as:  TRIMPEX Take 100 mg by mouth daily.   umeclidinium-vilanterol 62.5-25 MCG/INH Aepb Commonly known as:  ANORO ELLIPTA Inhale 1 puff into the lungs daily.       Allergies  Allergen Reactions  . Cortisone Shortness Of Breath, Swelling and Other (See Comments)    Tongue swelling   . Amlodipine Besylate Cough  . Chlorhexidine Gluconate Itching    Skin on face turned red   . Clindamycin Itching  . Dexamethasone Other (See Comments)    Per MD  . Duloxetine Other (See Comments)    Unknown reaction  . Morphine Sulfate Other (See Comments)    Unknown reaction  . Niacin Other (See Comments)  . Other Other (See Comments)    Pollen - nasal reaction  . Prednisone Other (See Comments)    Causes stomach pain. Sores in her mouth.   . Rosuvastatin Calcium Other (See Comments)    Muscle aches  . Statins Other (See Comments)    Muscle aches  . Sulfa Antibiotics Other (See Comments)     May possibly have caused deafness in one ear  . Valsartan Other (See Comments)    Unknown reaction  . Strawberry Extract Itching and Rash    Consultations:  Neurology    Procedures/Studies: X-ray Chest Pa And Lateral  Result Date: 12/08/2016 CLINICAL DATA:  Shortness of breath EXAM: CHEST  2 VIEW COMPARISON:  Yesterday FINDINGS: Normal heart size and mediastinal contours. There is no edema, consolidation, effusion, or pneumothorax. EKG leads create artifact over the chest. No acute osseous finding. Cholecystectomy clips. IMPRESSION: No evidence of active disease. Electronically Signed   By: Monte Fantasia M.D.   On: 12/08/2016 08:03   Dg Chest 2 View  Result Date: 12/07/2016 CLINICAL DATA:  sob weakness EXAM: CHEST  2 VIEW COMPARISON:  03/16/16 FINDINGS: The lungs are clear. The pulmonary vasculature is normal. Heart size is normal. Hilar and mediastinal contours are unremarkable. There is no pleural effusion. IMPRESSION: No active cardiopulmonary disease. Electronically Signed   By: Andreas Newport M.D.   On: 12/07/2016 21:21   Ct Chest W Contrast  Result Date: 12/08/2016 CLINICAL DATA:  Myasthenia gravis, evaluate for thymoma EXAM: CT CHEST WITH CONTRAST TECHNIQUE: Multidetector CT imaging of the chest was performed during intravenous contrast administration. CONTRAST:  84mL ISOVUE-300 IOPAMIDOL (ISOVUE-300) INJECTION 61% COMPARISON:  None. FINDINGS: Cardiovascular: The heart is normal in size. No pericardial effusion. No evidence of thoracic aortic aneurysm. Mild atherosclerotic calcifications of the aortic arch. Mediastinum/Nodes: No evidence of anterior mediastinal mass/thymoma. Small mediastinal lymph nodes which do not meet pathologic CT size criteria. Visualized thyroid is unremarkable. Lungs/Pleura: Lungs are clear. No suspicious pulmonary nodules. Mild centrilobular emphysematous changes, upper lobe predominant. No focal consolidation. No pleural effusion or pneumothorax.  Upper Abdomen: Visualized upper abdomen is notable for a small hiatal hernia with postsurgical changes related to Nissen fundoplication. Musculoskeletal: Degenerative changes of the visualized thoracolumbar spine. IMPRESSION: No evidence of anterior mediastinal mass/thymoma. No evidence of acute cardiopulmonary disease. Aortic Atherosclerosis (ICD10-I70.0) and Emphysema (ICD10-J43.9). Electronically Signed   By: Julian Hy M.D.   On: 12/08/2016 17:55      Discharge Exam: Vitals:   12/12/16 1254 12/12/16 1359  BP: 137/71 (!) 157/60  Pulse: 83 90  Resp: 17   Temp: 98 F (36.7 C)   SpO2: 97% 96%  Vitals:   12/12/16 1208 12/12/16 1210 12/12/16 1254 12/12/16 1359  BP: (!) 161/61 (!) 161/61 137/71 (!) 157/60  Pulse: 78 75 83 90  Resp: 17 17 17    Temp: 98.3 F (36.8 C) 98.3 F (36.8 C) 98 F (36.7 C)   TempSrc: Oral Oral Oral   SpO2: 96% 95% 97% 96%  Weight:      Height:        General: Pt is alert, awake, not in acute distress Cardiovascular: RRR, S1/S2 +, no rubs, no gallops Respiratory: CTA bilaterally, no wheezing, no rhonchi Abdominal: Soft, NT, ND, bowel sounds + Extremities: no edema, no cyanosis   The results of significant diagnostics from this hospitalization (including imaging, microbiology, ancillary and laboratory) are listed below for reference.     Microbiology: Recent Results (from the past 240 hour(s))  MRSA PCR Screening     Status: None   Collection Time: 12/08/16  2:42 AM  Result Value Ref Range Status   MRSA by PCR NEGATIVE NEGATIVE Final    Comment:        The GeneXpert MRSA Assay (FDA approved for NASAL specimens only), is one component of a comprehensive MRSA colonization surveillance program. It is not intended to diagnose MRSA infection nor to guide or monitor treatment for MRSA infections.      Labs: BNP (last 3 results) No results for input(s): BNP in the last 8760 hours. Basic Metabolic Panel:  Recent Labs Lab  12/07/16 1530 12/08/16 0351 12/09/16 0403 12/10/16 0420 12/11/16 0423 12/12/16 0532  NA 136  --  136 138 136 136  K 4.0  --  3.7 3.5 3.5 3.9  CL 102  --  106 104 104 104  CO2 24  --  25 27 26 26   GLUCOSE 90  --  97 80 90 81  BUN 14  --  6 9 9 11   CREATININE 1.46*  --  0.96 1.03* 1.02* 1.10*  CALCIUM 9.2  --  8.5* 8.8* 8.8* 9.0  MG  --  2.0  --   --   --   --   PHOS  --  3.3  --   --   --   --    Liver Function Tests: No results for input(s): AST, ALT, ALKPHOS, BILITOT, PROT, ALBUMIN in the last 168 hours. No results for input(s): LIPASE, AMYLASE in the last 168 hours. No results for input(s): AMMONIA in the last 168 hours. CBC:  Recent Labs Lab 12/07/16 1530 12/07/16 2051  WBC 8.4 8.7  NEUTROABS  --  5.6  HGB 14.6 13.8  HCT 44.8 41.9  MCV 90.9 90.1  PLT 295 287   Cardiac Enzymes:  Recent Labs Lab 12/08/16 0351 12/08/16 0821 12/08/16 1459  TROPONINI <0.03 <0.03 <0.03   BNP: Invalid input(s): POCBNP CBG:  Recent Labs Lab 12/08/16 0747 12/09/16 0815 12/10/16 0819 12/11/16 0808 12/12/16 0804  GLUCAP 105* 90 85 80 155*   D-Dimer No results for input(s): DDIMER in the last 72 hours. Hgb A1c No results for input(s): HGBA1C in the last 72 hours. Lipid Profile No results for input(s): CHOL, HDL, LDLCALC, TRIG, CHOLHDL, LDLDIRECT in the last 72 hours. Thyroid function studies No results for input(s): TSH, T4TOTAL, T3FREE, THYROIDAB in the last 72 hours.  Invalid input(s): FREET3 Anemia work up No results for input(s): VITAMINB12, FOLATE, FERRITIN, TIBC, IRON, RETICCTPCT in the last 72 hours. Urinalysis    Component Value Date/Time   COLORURINE STRAW (A) 12/07/2016 [place]  12/07/2016 0254   LABSPEC 1.004 (L) 12/07/2016 0254   PHURINE 8.0 12/07/2016 0254   GLUCOSEU NEGATIVE 12/07/2016 0254   HGBUR NEGATIVE 12/07/2016 0254   BILIRUBINUR NEGATIVE 12/07/2016 0254   KETONESUR NEGATIVE 12/07/2016 0254   PROTEINUR NEGATIVE 12/07/2016  0254   NITRITE NEGATIVE 12/07/2016 0254   LEUKOCYTESUR NEGATIVE 12/07/2016 0254   Sepsis Labs Invalid input(s): PROCALCITONIN,  WBC,  LACTICIDVEN Microbiology Recent Results (from the past 240 hour(s))  MRSA PCR Screening     Status: None   Collection Time: 12/08/16  2:42 AM  Result Value Ref Range Status   MRSA by PCR NEGATIVE NEGATIVE Final    Comment:        The GeneXpert MRSA Assay (FDA approved for NASAL specimens only), is one component of a comprehensive MRSA colonization surveillance program. It is not intended to diagnose MRSA infection nor to guide or monitor treatment for MRSA infections.     Time coordinating discharge: 35 minutes  SIGNED:  Chipper Oman, MD  [facility] 12/12/2016, 2:57 PM  Pager please text page via  [email or web address] Password [email or web address]

## 2016-12-12 NOTE — Care Management Important Message (Signed)
Important Message  Patient Details  Name: Erica Drake MRN: [id number] Date of Birth: Apr 21, 1935   Medicare Important Message Given:  Yes    [name] Abena 12/12/2016, 10:52 AM

## 2016-12-13 ENCOUNTER — Other Ambulatory Visit: Payer: Self-pay

## 2016-12-13 DIAGNOSIS — N3010 Interstitial cystitis (chronic) without hematuria: Secondary | ICD-10-CM | POA: Diagnosis not present

## 2016-12-13 NOTE — Patient Outreach (Signed)
[facility]) Care Management  12/13/2016  MAELEE HOOT 09-16-1935 [id number]   Subjective: client reports she is improving slowly.  Objective: none  Assessment: 81 year old recently admitted with Myasthenia Gravis. History of COPD, interstitial cystitis. Mrs. Beaird states she has personal care service person (private pay) that has been assisting her and her husband for years.  RNCM called for transition of care. Client reports she spoke with someone from [facility] just as she was leaving the hospital and reports she thinks she is going to go with [facility].   RNCM had extensive discussion with the difference between home health and [facility] management services. Reinforced that she could have home health services and [facility] management services involved. Also emphasize there is no cost associated with Care Management services.   Mrs. Sutherlin is still undecided about participation in care management services, but request RNCM not close case, Adding that she wanted to follow up with primary care office before final decision, therefore RNCM will open case.  Medications reviewed. No issues or concerns at this time.  RNCM provided contact number and encouraged to call as needed. Mrs. Stokes to call within the week regarding continued participation with care management services.  Plan: follow up telephonically next week.  Thea Silversmith, RN, MSN, [place] Coordinator Cell: [phone]

## 2016-12-14 ENCOUNTER — Telehealth: Payer: Self-pay | Admitting: *Deleted

## 2016-12-14 ENCOUNTER — Other Ambulatory Visit: Payer: Self-pay

## 2016-12-14 NOTE — Discharge Summary (Signed)
Physician Discharge Summary  QUYNH BASSO  DDU:[id number]  DOB: Nov 23, 1935  DOA: 12/07/2016 PCP: Prince Solian, MD  Admit date: 12/07/2016 Discharge date: 12/12/2016  Admitted From: Home  Disposition: Home   Recommendations for Outpatient Follow-up:  1. Follow up with PCP in 1 week  2. Please follow up with neurology in 1-2 weeks   3. Please obtain BMP/CBC in one week  Discharge Condition: Stable   CODE STATUS: FULL  Diet recommendation: Heart Healthy   Brief/Interim Summary: For full details see H&P and progress notes but in brief, 81 year old female with medical history of myasthenia gravis presented with5 days of worsening weakness of the neck, difficulty holding her head upright and blurry vision. Patient was admitted to the hospital with working diagnosis of myasthenia gravis exacerbation, neurology was consulted and she was started on IVIG. Patient clinically improved and completed IVIG protocol. She will be discharged to follow-up with neuromuscular specialist as an outpatient. Patient was prescribed prednisone last she did not tolerate this in the past.   Subjective: Patient seen and examined, feeling well, denies chest pain, shortness of breath and palpitations. Still mild induration and mild arm weakness but significant improvement. Patient remains afebrile and tolerating diet well.  Discharge Diagnoses/Hospital Course:  Myasthenia gravis exacerbation - completed IVIG protocol with significant improvement. During hospital stay neurology was following the patient. Patient was not started on prednisone as she did not tolerate this in the past. Chest CT with no thymoma. Patient will be discharged on Mestinon and to follow-up with neuromuscular specialist as an outpatient.  Acute kidney injury due to dehydration Renal function stable Monitor in one week with PCP  Asthma - stable during hospital stay   Depression. No SI/HI - Stable   All other chronic  medical condition were stable during the hospitalization. On the day of the discharge the patient's vitals were stable, and no other acute medical condition were reported by patient. Patient was felt safe to be discharge to home   Discharge Instructions  You were cared for by a hospitalist during your hospital stay. If you have any questions about your discharge medications or the care you received while you were in the hospital after you are discharged, you can call the unit and asked to speak with the hospitalist on call if the hospitalist that took care of you is not available. Once you are discharged, your primary care physician will handle any further medical issues. Please note that NO REFILLS for any discharge medications will be authorized once you are discharged, as it is imperative that you return to your primary care physician (or establish a relationship with a primary care physician if you do not have one) for your aftercare needs so that they can reassess your need for medications and monitor your lab values.      Discharge Instructions    AMB Referral to [facility]    Complete by:  As directed    Reason for consult:  Transition of care follow up   Expected date of contact:  1-3 days (reserved for hospital discharges)   Please assign to community nurse for transition of care calls and assess for home visits. Questions please call:   Natividad Brood, RN BSN [facility]iaison  [phone] business mobile phone Toll free office [phone]   Call MD for:  difficulty breathing, headache or visual disturbances    Complete by:  As directed    Call MD for:  extreme fatigue  Complete by:  As directed    Call MD for:  hives    Complete by:  As directed    Call MD for:  persistant dizziness or light-headedness    Complete by:  As directed    Call MD for:  persistant nausea and vomiting    Complete by:  As directed    Call MD for:   redness, tenderness, or signs of infection (pain, swelling, redness, odor or green/yellow discharge around incision site)    Complete by:  As directed    Call MD for:  severe uncontrolled pain    Complete by:  As directed    Call MD for:  temperature >100.4    Complete by:  As directed    Diet - low sodium heart healthy    Complete by:  As directed    Increase activity slowly    Complete by:  As directed          Allergies as of 12/12/2016      Reactions   Cortisone Shortness Of Breath, Swelling, Other (See Comments)   Tongue swelling   Amlodipine Besylate Cough   Chlorhexidine Gluconate Itching   Skin on face turned red   Clindamycin Itching   Dexamethasone Other (See Comments)   Per MD   Duloxetine Other (See Comments)   Unknown reaction   Morphine Sulfate Other (See Comments)   Unknown reaction   Niacin Other (See Comments)   Other Other (See Comments)   Pollen - nasal reaction   Prednisone Other (See Comments)   Causes stomach pain. Sores in her mouth.   Rosuvastatin Calcium Other (See Comments)   Muscle aches   Statins Other (See Comments)   Muscle aches   Sulfa Antibiotics Other (See Comments)   May possibly have caused deafness in one ear   Valsartan Other (See Comments)   Unknown reaction   Strawberry Extract Itching, Rash              Medication List     TAKE these medications   buPROPion 200 MG 12 hr tablet Commonly known as:  WELLBUTRIN SR Take 200 mg by mouth 2 (two) times daily after a meal.   clonazePAM 1 MG tablet Commonly known as:  KLONOPIN Take 1 tablet by mouth See admin instructions. Take 1 tablet (1 mg) three times daily - morning, after supper and before bed   clonazePAM 1 MG disintegrating tablet Commonly known as:  KLONOPIN Take 1 mg by mouth See admin instructions. Dissolve 1 tablet (1 mg) under the tongue daily after going to bed; also dissolve 1 tablet (1 mg) on Tuesdays and Thursdays prior  to going to doctor's appointment for sodium bicarb treatments.   Fluticasone-Umeclidin-Vilant 100-62.5-25 MCG/INH Aepb Commonly known as:  TRELEGY ELLIPTA Inhale 1 puff into the lungs daily.   HYDROcodone-acetaminophen 5-325 MG tablet Commonly known as:  NORCO Take 1-2 tablets by mouth every 4 (four) hours as needed for moderate pain.   methocarbamol 500 MG tablet Commonly known as:  ROBAXIN Take 1 tablet (500 mg total) by mouth 4 (four) times daily. As needed for muscle spasm   PRESCRIPTION MEDICATION Inhale into the lungs at bedtime. CPAP   PROAIR HFA 108 (90 Base) MCG/ACT inhaler Generic drug:  albuterol Inhale 1-2 puffs into the lungs every 4 (four) hours as needed for wheezing or shortness of breath.   albuterol (2.5 MG/3ML) 0.083% nebulizer solution Commonly known as:  PROVENTIL Take 3 mLs (2.5 mg total) by nebulization  every 6 (six) hours as needed for wheezing or shortness of breath.   pyridostigmine 60 MG tablet Commonly known as:  MESTINON Take 1 tablet (60 mg total) by mouth 3 (three) times daily.   trimethoprim 100 MG tablet Commonly known as:  TRIMPEX Take 100 mg by mouth daily.   umeclidinium-vilanterol 62.5-25 MCG/INH Aepb Commonly known as:  ANORO ELLIPTA Inhale 1 puff into the lungs daily.           Allergies  Allergen Reactions  . Cortisone Shortness Of Breath, Swelling and Other (See Comments)    Tongue swelling   . Amlodipine Besylate Cough  . Chlorhexidine Gluconate Itching    Skin on face turned red   . Clindamycin Itching  . Dexamethasone Other (See Comments)    Per MD  . Duloxetine Other (See Comments)    Unknown reaction  . Morphine Sulfate Other (See Comments)    Unknown reaction  . Niacin Other (See Comments)  . Other Other (See Comments)    Pollen - nasal reaction  . Prednisone Other (See Comments)    Causes stomach pain. Sores in her mouth.   . Rosuvastatin Calcium Other (See Comments)    Muscle  aches  . Statins Other (See Comments)    Muscle aches  . Sulfa Antibiotics Other (See Comments)    May possibly have caused deafness in one ear  . Valsartan Other (See Comments)    Unknown reaction  . Strawberry Extract Itching and Rash    Consultations:  Neurology    Procedures/Studies: Imaging Results  X-ray Chest Pa And Lateral  Result Date: 12/08/2016 CLINICAL DATA:  Shortness of breath EXAM: CHEST  2 VIEW COMPARISON:  Yesterday FINDINGS: Normal heart size and mediastinal contours. There is no edema, consolidation, effusion, or pneumothorax. EKG leads create artifact over the chest. No acute osseous finding. Cholecystectomy clips. IMPRESSION: No evidence of active disease. Electronically Signed   By: Monte Fantasia M.D.   On: 12/08/2016 08:03   Dg Chest 2 View  Result Date: 12/07/2016 CLINICAL DATA:  sob weakness EXAM: CHEST  2 VIEW COMPARISON:  03/16/16 FINDINGS: The lungs are clear. The pulmonary vasculature is normal. Heart size is normal. Hilar and mediastinal contours are unremarkable. There is no pleural effusion. IMPRESSION: No active cardiopulmonary disease. Electronically Signed   By: Andreas Newport M.D.   On: 12/07/2016 21:21   Ct Chest W Contrast  Result Date: 12/08/2016 CLINICAL DATA:  Myasthenia gravis, evaluate for thymoma EXAM: CT CHEST WITH CONTRAST TECHNIQUE: Multidetector CT imaging of the chest was performed during intravenous contrast administration. CONTRAST:  31mL ISOVUE-300 IOPAMIDOL (ISOVUE-300) INJECTION 61% COMPARISON:  None. FINDINGS: Cardiovascular: The heart is normal in size. No pericardial effusion. No evidence of thoracic aortic aneurysm. Mild atherosclerotic calcifications of the aortic arch. Mediastinum/Nodes: No evidence of anterior mediastinal mass/thymoma. Small mediastinal lymph nodes which do not meet pathologic CT size criteria. Visualized thyroid is unremarkable. Lungs/Pleura: Lungs are clear. No suspicious pulmonary  nodules. Mild centrilobular emphysematous changes, upper lobe predominant. No focal consolidation. No pleural effusion or pneumothorax. Upper Abdomen: Visualized upper abdomen is notable for a small hiatal hernia with postsurgical changes related to Nissen fundoplication. Musculoskeletal: Degenerative changes of the visualized thoracolumbar spine. IMPRESSION: No evidence of anterior mediastinal mass/thymoma. No evidence of acute cardiopulmonary disease. Aortic Atherosclerosis (ICD10-I70.0) and Emphysema (ICD10-J43.9). Electronically Signed   By: Julian Hy M.D.   On: 12/08/2016 17:55        Discharge Exam:     Vitals:  12/12/16 1254 12/12/16 1359  BP: 137/71 (!) 157/60  Pulse: 83 90  Resp: 17   Temp: 98 F (36.7 C)   SpO2: 97% 96%         Vitals:   12/12/16 1208 12/12/16 1210 12/12/16 1254 12/12/16 1359  BP: (!) 161/61 (!) 161/61 137/71 (!) 157/60  Pulse: 78 75 83 90  Resp: 17 17 17    Temp: 98.3 F (36.8 C) 98.3 F (36.8 C) 98 F (36.7 C)   TempSrc: Oral Oral Oral   SpO2: 96% 95% 97% 96%  Weight:      Height:        General: Pt is alert, awake, not in acute distress Cardiovascular: RRR, S1/S2 +, no rubs, no gallops Respiratory: CTA bilaterally, no wheezing, no rhonchi Abdominal: Soft, NT, ND, bowel sounds + Extremities: no edema, no cyanosis    The results of significant diagnostics from this hospitalization (including imaging, microbiology, ancillary and laboratory) are listed below for reference.     Microbiology:        Recent Results (from the past 240 hour(s))  MRSA PCR Screening     Status: None   Collection Time: 12/08/16  2:42 AM  Result Value Ref Range Status   MRSA by PCR NEGATIVE NEGATIVE Final    Comment:        The GeneXpert MRSA Assay (FDA approved for NASAL specimens only), is one component of a comprehensive MRSA colonization surveillance program. It is not intended to diagnose MRSA infection nor to guide  or monitor treatment for MRSA infections.      Labs: BNP (last 3 results) Recent Labs (within last 365 days)  No results for input(s): BNP in the last 8760 hours.   Basic Metabolic Panel:  Last Labs    Recent Labs Lab 12/07/16 1530 12/08/16 0351 12/09/16 0403 12/10/16 0420 12/11/16 0423 12/12/16 0532  NA 136  --  136 138 136 136  K 4.0  --  3.7 3.5 3.5 3.9  CL 102  --  106 104 104 104  CO2 24  --  25 27 26 26   GLUCOSE 90  --  97 80 90 81  BUN 14  --  6 9 9 11   CREATININE 1.46*  --  0.96 1.03* 1.02* 1.10*  CALCIUM 9.2  --  8.5* 8.8* 8.8* 9.0  MG  --  2.0  --   --   --   --   PHOS  --  3.3  --   --   --   --      Liver Function Tests: Last Labs   No results for input(s): AST, ALT, ALKPHOS, BILITOT, PROT, ALBUMIN in the last 168 hours.   Last Labs   No results for input(s): LIPASE, AMYLASE in the last 168 hours.   Last Labs   No results for input(s): AMMONIA in the last 168 hours.   CBC:  Last Labs    Recent Labs Lab 12/07/16 1530 12/07/16 2051  WBC 8.4 8.7  NEUTROABS  --  5.6  HGB 14.6 13.8  HCT 44.8 41.9  MCV 90.9 90.1  PLT 295 287     Cardiac Enzymes:  Last Labs    Recent Labs Lab 12/08/16 0351 12/08/16 0821 12/08/16 1459  TROPONINI <0.03 <0.03 <0.03     BNP: Last Labs   Invalid input(s): POCBNP   CBG:  Last Labs    Recent Labs Lab 12/08/16 0747 12/09/16 0815 12/10/16 0819 12/11/16 0808 12/12/16 0804  GLUCAP  105* 90 85 80 155*     D-Dimer Recent Labs (last 2 labs)   No results for input(s): DDIMER in the last 72 hours.   Hgb A1c Recent Labs (last 2 labs)   No results for input(s): HGBA1C in the last 72 hours.   Lipid Profile Recent Labs (last 2 labs)   No results for input(s): CHOL, HDL, LDLCALC, TRIG, CHOLHDL, LDLDIRECT in the last 72 hours.   Thyroid function studies  Recent Labs (last 2 labs)   No results for input(s): TSH, T4TOTAL, T3FREE, THYROIDAB in the last 72 hours.  Invalid input(s):  FREET3   Anemia work up [employer] (last 2 labs)   No results for input(s): VITAMINB12, FOLATE, FERRITIN, TIBC, IRON, RETICCTPCT in the last 72 hours.   Urinalysis Labs (Brief)          Component Value Date/Time   COLORURINE STRAW (A) 12/07/2016 0254   APPEARANCEUR CLEAR 12/07/2016 0254   LABSPEC 1.004 (L) 12/07/2016 0254   PHURINE 8.0 12/07/2016 0254   GLUCOSEU NEGATIVE 12/07/2016 0254   HGBUR NEGATIVE 12/07/2016 0254   BILIRUBINUR NEGATIVE 12/07/2016 0254   KETONESUR NEGATIVE 12/07/2016 0254   PROTEINUR NEGATIVE 12/07/2016 0254   NITRITE NEGATIVE 12/07/2016 0254   LEUKOCYTESUR NEGATIVE 12/07/2016 0254     Sepsis Labs Last Labs   Invalid input(s): PROCALCITONIN,  WBC,  LACTICIDVEN   Microbiology        Recent Results (from the past 240 hour(s))  MRSA PCR Screening     Status: None   Collection Time: 12/08/16  2:42 AM  Result Value Ref Range Status   MRSA by PCR NEGATIVE NEGATIVE Final    Comment:        The GeneXpert MRSA Assay (FDA approved for NASAL specimens only), is one component of a comprehensive MRSA colonization surveillance program. It is not intended to diagnose MRSA infection nor to guide or monitor treatment for MRSA infections.     Time coordinating discharge: 35 minutes  SIGNED:  Chipper Oman, MD           [facility] 12/12/2016, 2:57 PM

## 2016-12-14 NOTE — Telephone Encounter (Signed)
Transition Care Management Follow-up Telephone Call   Date discharged? 12-13-16   How have you been since you were released from the hospital? She says that she can hold her head up but her neck gets tired.     Do you understand why you were in the hospital? yes, MG   Do you understand the discharge instructions? yes,    Where were you discharged to? home   Items Reviewed:  Medications reviewed: yes  Allergies reviewed: yes   Dietary changes reviewed: no dietary changes   Referrals reviewed: yes  Functional Questionnaire:   Activities of Daily Living (ADLs):   She states they are independent in the following: ambulation, bathing and hygiene, feeding, continence, grooming, toileting and dressing States they require assistance with the following: no assistance needed   Any transportation issues/concerns?: no   Any patient concerns? no   Confirmed importance and date/time of follow-up visits scheduled : yes    Provider Appointment booked with Dr. Posey Pronto on 12-21-16.  Confirmed with patient if condition begins to worsen call our office, PCP or go to the ER.  Patient was given the office number and encouraged to call back with question or concerns.  : yes

## 2016-12-14 NOTE — Patient Outreach (Signed)
[facility]) Care Management  12/14/2016  Erica Drake 16-Dec-1935 [id number]   Care Coordination: Received message from assistant that client called stating she would like to participate in the program. RNCM returned call and scheduled home visit for next week.  Plan: home visit next week.  Thea Silversmith, RN, MSN, [place] Coordinator Cell: [phone]

## 2016-12-18 DIAGNOSIS — G7001 Myasthenia gravis with (acute) exacerbation: Secondary | ICD-10-CM

## 2016-12-18 DIAGNOSIS — N3010 Interstitial cystitis (chronic) without hematuria: Secondary | ICD-10-CM | POA: Diagnosis not present

## 2016-12-19 DIAGNOSIS — M797 Fibromyalgia: Secondary | ICD-10-CM | POA: Diagnosis not present

## 2016-12-19 DIAGNOSIS — K5733 Diverticulitis of large intestine without perforation or abscess with bleeding: Secondary | ICD-10-CM | POA: Diagnosis not present

## 2016-12-19 DIAGNOSIS — D126 Benign neoplasm of colon, unspecified: Secondary | ICD-10-CM | POA: Diagnosis not present

## 2016-12-19 DIAGNOSIS — K5900 Constipation, unspecified: Secondary | ICD-10-CM | POA: Diagnosis not present

## 2016-12-19 DIAGNOSIS — K582 Mixed irritable bowel syndrome: Secondary | ICD-10-CM | POA: Diagnosis not present

## 2016-12-19 DIAGNOSIS — K219 Gastro-esophageal reflux disease without esophagitis: Secondary | ICD-10-CM | POA: Diagnosis not present

## 2016-12-19 DIAGNOSIS — N3011 Interstitial cystitis (chronic) with hematuria: Secondary | ICD-10-CM | POA: Diagnosis not present

## 2016-12-20 ENCOUNTER — Ambulatory Visit: Payer: Self-pay

## 2016-12-20 ENCOUNTER — Other Ambulatory Visit: Payer: Self-pay

## 2016-12-20 DIAGNOSIS — L570 Actinic keratosis: Secondary | ICD-10-CM | POA: Diagnosis not present

## 2016-12-20 DIAGNOSIS — L820 Inflamed seborrheic keratosis: Secondary | ICD-10-CM | POA: Diagnosis not present

## 2016-12-20 DIAGNOSIS — L814 Other melanin hyperpigmentation: Secondary | ICD-10-CM | POA: Diagnosis not present

## 2016-12-20 DIAGNOSIS — D1801 Hemangioma of skin and subcutaneous tissue: Secondary | ICD-10-CM | POA: Diagnosis not present

## 2016-12-20 DIAGNOSIS — Z85828 Personal history of other malignant neoplasm of skin: Secondary | ICD-10-CM | POA: Diagnosis not present

## 2016-12-20 DIAGNOSIS — D225 Melanocytic nevi of trunk: Secondary | ICD-10-CM | POA: Diagnosis not present

## 2016-12-20 DIAGNOSIS — N3010 Interstitial cystitis (chronic) without hematuria: Secondary | ICD-10-CM | POA: Diagnosis not present

## 2016-12-20 DIAGNOSIS — L821 Other seborrheic keratosis: Secondary | ICD-10-CM | POA: Diagnosis not present

## 2016-12-20 DIAGNOSIS — L718 Other rosacea: Secondary | ICD-10-CM | POA: Diagnosis not present

## 2016-12-20 NOTE — Patient Outreach (Signed)
[facility]) Care Management  12/20/2016  Erica Drake 1935/11/16 [id number]  Home visit scheduled for today. RNCM called prior to home visit. Client reports she called the office to cancel home visit due to being tired. Client reports she has not been sleeping well due to discomfort of fibromyalgia and myasthenia gravis.  Client reports she has an appointment scheduled with neurologist on Monday and she reports she will go to her urologist appointment today. Client request be called next week for follow up and request to reschedule home visit for two weeks.  Plan: telephonic follow up next week and home visit scheduled in two weeks.  Thea Silversmith, RN, MSN, [place] Coordinator Cell: [phone]

## 2016-12-21 ENCOUNTER — Ambulatory Visit: Payer: Medicare Other | Admitting: Neurology

## 2016-12-24 ENCOUNTER — Ambulatory Visit (INDEPENDENT_AMBULATORY_CARE_PROVIDER_SITE_OTHER): Payer: Medicare Other | Admitting: Neurology

## 2016-12-24 ENCOUNTER — Encounter: Payer: Self-pay | Admitting: Neurology

## 2016-12-24 VITALS — BP 110/70 | HR 96 | Ht 65.0 in | Wt 151.5 lb

## 2016-12-24 DIAGNOSIS — H8110 Benign paroxysmal vertigo, unspecified ear: Secondary | ICD-10-CM

## 2016-12-24 DIAGNOSIS — G7001 Myasthenia gravis with (acute) exacerbation: Secondary | ICD-10-CM

## 2016-12-24 MED ORDER — MECLIZINE HCL 12.5 MG PO TABS: 12.5000 mg | ORAL_TABLET | Freq: Three times a day (TID) | ORAL | 3 refills | Status: AC | PRN

## 2016-12-24 MED ORDER — PREDNISONE 10 MG PO TABS: 20.0000 mg | ORAL_TABLET | Freq: Every day | ORAL | 5 refills | Status: DC

## 2016-12-24 NOTE — Patient Instructions (Addendum)
Start prednisone 20mg  daily Maintain calcium intake of 1200 mg/day and vitamin D intake of 800 international units/day through either diet and/or supplements Start nexium to avoid gastric ulcers Continue mestinon 60mg  three times daily  Return to clinic in 6 weeks

## 2016-12-24 NOTE — Progress Notes (Signed)
Follow-up Visit   Date: 12/24/16    Erica Drake MRN: [id number] DOB: 09/21/1935   Interim History: Erica Drake is a 81 y.o. right-handed Caucasian female with fibromyalgia, GERD, depression, left sensorineural deficit, and interstitial cystitis, type 2 diabetes mellitus returning to the clinic for follow-up of transition care management.  The patient was accompanied to the clinic by self.  History of present illness: Initial visit 09/29/2015:  In May 2017, she began experiencing double vision, with images diagonal of each other. Some mornings are much better, but as the day progresses, her double vision gets worse.  She endorses mild changes to her speech.  No difficulty with swallowing or shortness of breath.  She also complains of blurry vision in her right eye. The double vision has interfered with her driving, so she stopped this 2 months ago. She has seen her ENT, PCP, ophthalmologist, and then saw Dr. Jaynee Eagles in June who ordered MRI/A head and orbit which was normal.  Her acetylcholine antibodies returned positive.  A prescription for mestinon 26m three times daily was ordered, but patient did not start it.  She has many drug intolerances/allergies, including cortisone, so medications were started at a low dose.   She had many other chronic medical problems including chronic pain related to fibromyalgia, arthritis, and interstitial cystitis.    In the fall of 2017, I started prednisone 168mand increased mestinon to 604mID which did not provide any relief. She stopped taking her prednisone because she has a number of other problems including diverticulosis and bronchitis, which she thought would be worsened by prednisone. Despite stopping her prednisone, there has been no change in her double vision.    UPDATE 04/18/2016:  [facility] and was recommended to have SFEMG, but she did not wish to return for testing.  Over the past two  months, she has noticed that double vision is improved and now only occurs intermittently throughout the day which she attributes to mestinon 76m28mD.  She is not taking prednisone.  She no longer uses an eye patch.  She denies double vision in the morning or difficulty swallowing.  [place] in [place], whose health is doing well.  UPDATE 09/03/2016:  She is here for 3 month appointment.  Overall, she feels that double vision and ptosis is stable.  She does not need to wear her eye patch and is complaint with mestinon 76mg50m.  She sometimes has blurry vision at end gaze when driving, but does see double.   UPDATE 12/24/2016:   She is here for hospital discharge follow-up and transition care management.  He was hospitalized from 10/12 - 12/12/2016 with MG exacerbation manifesting with head drop, ptosis, and dysphagia.  She was treated with IVIG x 5 days and reports having improvement in dysphasia and weakness.  She no longer has double vision and ptosis is only worse in the evenings.  She does not feel that she is quite as strong as she used to be and she continues to carry a neck collar, if her neck feels heavy.  She woke up today with vertigo which she has had in the past, which responds to meclizine, and is requesting refill.   Medications:  Current Outpatient Prescriptions on File Prior to Visit  Medication Sig Dispense Refill  . albuterol (PROAIR HFA) 108 (90 Base) MCG/ACT inhaler Inhale 1-2 puffs into the lungs every 4 (four) hours  as needed for wheezing or shortness of breath.     Marland Kitchen albuterol (PROVENTIL) (2.5 MG/3ML) 0.083% nebulizer solution Take 3 mLs (2.5 mg total) by nebulization every 6 (six) hours as needed for wheezing or shortness of breath. 25 vial 11  . buPROPion (WELLBUTRIN SR) 200 MG 12 hr tablet Take 200 mg by mouth 2 (two) times daily after a meal.     . clonazePAM (KLONOPIN) 1 MG tablet Take 1 tablet by mouth See admin instructions. Take 1 tablet (1 mg)  three times daily - morning, after supper and before bed  4  . Fluticasone-Umeclidin-Vilant (TRELEGY ELLIPTA) 100-62.5-25 MCG/INH AEPB Inhale 1 puff into the lungs daily. 2 each 0  . HYDROcodone-acetaminophen (NORCO) 5-325 MG tablet Take 1-2 tablets by mouth every 4 (four) hours as needed for moderate pain. 30 tablet 0  . methocarbamol (ROBAXIN) 500 MG tablet Take 1 tablet (500 mg total) by mouth 4 (four) times daily. As needed for muscle spasm 30 tablet 1  . PRESCRIPTION MEDICATION Inhale into the lungs at bedtime. CPAP    . pyridostigmine (MESTINON) 60 MG tablet Take 1 tablet (60 mg total) by mouth 3 (three) times daily. 270 tablet 3  . trimethoprim (TRIMPEX) 100 MG tablet Take 100 mg by mouth daily.  11  . umeclidinium-vilanterol (ANORO ELLIPTA) 62.5-25 MCG/INH AEPB Inhale 1 puff into the lungs daily. 60 each 5   No current facility-administered medications on file prior to visit.     Allergies:  Allergies  Allergen Reactions  . Cortisone Shortness Of Breath, Swelling and Other (See Comments)    Tongue swelling   . Amlodipine Besylate Cough  . Chlorhexidine Gluconate Itching    Skin on face turned red   . Clindamycin Itching  . Dexamethasone Other (See Comments)    Per MD  . Duloxetine Other (See Comments)    Unknown reaction  . Morphine Sulfate Other (See Comments)    Unknown reaction  . Niacin Other (See Comments)  . Other Other (See Comments)    Pollen - nasal reaction  . Prednisone Other (See Comments)    Causes stomach pain. Sores in her mouth.   . Rosuvastatin Calcium Other (See Comments)    Muscle aches  . Statins Other (See Comments)    Muscle aches  . Sulfa Antibiotics Other (See Comments)    May possibly have caused deafness in one ear  . Valsartan Other (See Comments)    Unknown reaction  . Strawberry Extract Itching and Rash    Review of Systems:  CONSTITUTIONAL: No fevers, chills, night sweats, or weight loss.  EYES: No visual changes or eye pain ENT:  No hearing changes.  No history of nose bleeds.   RESPIRATORY: No cough, wheezing and shortness of breath.   CARDIOVASCULAR: Negative for chest pain, and palpitations.   GI: Negative for abdominal discomfort, blood in stools or black stools.  No recent change in bowel habits.   GU:  No history of incontinence.   MUSCLOSKELETAL: No history of joint pain or swelling.  No myalgias.   SKIN: Negative for lesions, rash, and itching.   ENDOCRINE: Negative for cold or heat intolerance, polydipsia or goiter.   PSYCH:  + depression or anxiety symptoms.   NEURO: As Above.   Vital Signs:  BP 110/70   Pulse 96   Ht _0  (1.651 m)   Wt 151 lb 8 oz (68.7 kg)   SpO2 92%   BMI 25.21 kg/m   General: Well-appearing, comfortable  Neurological Exam: MENTAL STATUS including orientation to time, place, person, recent and remote memory, attention span and concentration, language, and fund of knowledge is normal.  Speech is not dysarthric.  CRANIAL NERVES:   Pupils equal round and reactive to light.  There is no ophthalmoplegia.  Left ptosis is mild, and worse with sustained upgaze.  Face is symmetric.  Tongue is midline and strength is 5/5.  Orbicularis oculi and buccinator is 5/5.   MOTOR:  Motor strength is 5/5 in all extremities, neck flexion is is 5-/5.    COORDINATION/GAIT:    Gait is stable, unassisted with cane due to vertigo.  Data: Labs 08/26/2015:  AChR binding (0.63) blocking (28) antibody, ESR 3, CRP 4.9  MRI/A head 09/09/2015: This MR angiogram of the intracerebral arteries shows the following: 1. Very minimal atherosclerotic change within the left posterior cerebral artery that is unlikely to be clinically significant. 2. There is a normal variant with the left posterior cerebral artery obtaining its flow from the anterior circulation. 3. No aneurysms are seen. 4. No new findings compared to the 11/25/2003 MRA.   MRI orbit wwo contrast 09/09/2015:  This is is a normal MRI  of the orbits with and without contrast.  CT chest w contrast 12/08/2016:  No evidence of thymoma  IMPRESSION/PLAN: 1. 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  She was counseled on the potential adverse effects of high-dose steroid therapy including, but not limited to hypertension, secondary diabetes mellitus, susceptibility to infections, anxiety, insomnia, weight gain, bone issues (including avascular necrosis and osteoporotic changes) and cataracts.   - Maintain calcium intake of 1200 mg/day and vitamin D intake of 800 international units/day through either diet and/or supplements  - Start proton pump inhibitor to avoid gastric ulcers   - Continue mestinon 665mthree times daily    - Start PA for IVIG 0.59m59mg x 1 dose as monthly maintenance dose, if she does not respond to prednisone  2. Benign paroxsymal positional vertigo, start meclizine 59m13mD prn  Return to clinic in 6 weeks   Thank you for allowing me to participate in patient's care.  If I can answer any additional questions, I would be pleased to do so.    Sincerely,    Tally Mckinnon K. PatePosey Pronto

## 2016-12-25 DIAGNOSIS — N3010 Interstitial cystitis (chronic) without hematuria: Secondary | ICD-10-CM | POA: Diagnosis not present

## 2016-12-26 DIAGNOSIS — M7989 Other specified soft tissue disorders: Secondary | ICD-10-CM | POA: Diagnosis not present

## 2016-12-26 DIAGNOSIS — F3289 Other specified depressive episodes: Secondary | ICD-10-CM | POA: Diagnosis not present

## 2016-12-26 DIAGNOSIS — I10 Essential (primary) hypertension: Secondary | ICD-10-CM | POA: Diagnosis not present

## 2016-12-26 DIAGNOSIS — J45909 Unspecified asthma, uncomplicated: Secondary | ICD-10-CM | POA: Diagnosis not present

## 2016-12-26 DIAGNOSIS — N3010 Interstitial cystitis (chronic) without hematuria: Secondary | ICD-10-CM | POA: Diagnosis not present

## 2016-12-26 DIAGNOSIS — Z6825 Body mass index (BMI) 25.0-25.9, adult: Secondary | ICD-10-CM | POA: Diagnosis not present

## 2016-12-26 DIAGNOSIS — G7000 Myasthenia gravis without (acute) exacerbation: Secondary | ICD-10-CM | POA: Diagnosis not present

## 2016-12-26 DIAGNOSIS — K219 Gastro-esophageal reflux disease without esophagitis: Secondary | ICD-10-CM | POA: Diagnosis not present

## 2016-12-28 ENCOUNTER — Other Ambulatory Visit: Payer: Self-pay

## 2016-12-28 ENCOUNTER — Other Ambulatory Visit: Payer: Self-pay | Admitting: *Deleted

## 2016-12-28 MED ORDER — IMMUNE GLOBULIN (HUMAN) 5 GM/50ML IV SOLN: 0.4000 mg/kg | Freq: Once | INTRAVENOUS | 0 refills | Status: AC

## 2016-12-28 NOTE — Progress Notes (Unsigned)
ivig

## 2016-12-28 NOTE — Patient Outreach (Signed)
[facility]) Care Management  12/28/2016  Erica Drake Sep 07, 1935 [id number]  Subjective: none  Objective: none  Assessment: 81 year old recently admitted with Myasthenia Gravis. History of COPD, interstitial cystitis.   RNCM called for transition of care. No answer. HIPPA compliant message left on voicemail.  Plan: attend home visit -previously scheduled for next week.  Thea Silversmith, RN, MSN, [place] Coordinator Cell: [phone]

## 2017-01-01 ENCOUNTER — Other Ambulatory Visit: Payer: Self-pay

## 2017-01-01 DIAGNOSIS — N3010 Interstitial cystitis (chronic) without hematuria: Secondary | ICD-10-CM | POA: Diagnosis not present

## 2017-01-01 NOTE — Patient Outreach (Signed)
[facility]) Care Management   01/01/2017  LAIKYNN POLLIO Dec 14, 1935 [id number]  EKNOOR NOVACK is an 81 y.o. female  Subjective: client reports she is able to hold up her head now.  Objective: BP (!) 150/78   Pulse 92   Resp 20   SpO2 93%    Review of Systems  Respiratory:       Lungs clear, respirations even unlabored.  Cardiovascular:       S1S2 noted. Heart rate regular.    Physical Exam  Encounter Medications:   Outpatient Encounter Medications as of 01/01/2017  Medication Sig Note  . albuterol (PROAIR HFA) 108 (90 Base) MCG/ACT inhaler Inhale 1-2 puffs into the lungs every 4 (four) hours as needed for wheezing or shortness of breath.    Marland Kitchen albuterol (PROVENTIL) (2.5 MG/3ML) 0.083% nebulizer solution Take 3 mLs (2.5 mg total) by nebulization every 6 (six) hours as needed for wheezing or shortness of breath.   Marland Kitchen buPROPion (WELLBUTRIN SR) 200 MG 12 hr tablet Take 200 mg by mouth 2 (two) times daily after a meal.    . clonazePAM (KLONOPIN) 1 MG tablet Take 1 tablet by mouth See admin instructions. Take 1 tablet (1 mg) three times daily - morning, after supper and before bed 01/01/2017: Takes 1 tablet in the morning and three tablets at night.   . meclizine (ANTIVERT) 12.5 MG tablet Take 1 tablet (12.5 mg total) by mouth 3 (three) times daily as needed for dizziness.   . methylcellulose (CITRUCEL) oral powder Take daily by mouth.   Marland Kitchen omeprazole (PRILOSEC) 20 MG capsule Take 20 mg daily by mouth.   . predniSONE (DELTASONE) 10 MG tablet Take 2 tablets (20 mg total) by mouth daily with breakfast.   . pyridostigmine (MESTINON) 60 MG tablet Take 1 tablet (60 mg total) by mouth 3 (three) times daily.   Marland Kitchen trimethoprim (TRIMPEX) 100 MG tablet Take 100 mg by mouth daily.   Marland Kitchen umeclidinium-vilanterol (ANORO ELLIPTA) 62.5-25 MCG/INH AEPB Inhale 1 puff into the lungs daily.   . Fluticasone-Umeclidin-Vilant (TRELEGY ELLIPTA) 100-62.5-25 MCG/INH AEPB Inhale 1 puff into the  lungs daily. (Patient not taking: Reported on 01/01/2017)   . HYDROcodone-acetaminophen (NORCO) 5-325 MG tablet Take 1-2 tablets by mouth every 4 (four) hours as needed for moderate pain. (Patient not taking: Reported on 01/01/2017)   . methocarbamol (ROBAXIN) 500 MG tablet Take 1 tablet (500 mg total) by mouth 4 (four) times daily. As needed for muscle spasm (Patient not taking: Reported on 01/01/2017)   . PRESCRIPTION MEDICATION Inhale into the lungs at bedtime. CPAP    No facility-administered encounter medications on file as of 01/01/2017.     Functional Status:   In your present state of health, do you have any difficulty performing the following activities: 01/01/2017 12/08/2016  Hearing? Y Y  Comment - -  Vision? Y Y  Difficulty concentrating or making decisions? Y N  Comment "remebering things" -  Walking or climbing stairs? Y Y  Dressing or bathing? N N  Doing errands, shopping? Y N  Preparing Food and eating ? N -  Using the Toilet? N -  In the past six months, have you accidently leaked urine? Y -  Do you have problems with loss of bowel control? N -  Managing your Medications? Y -  Managing your Finances? N -  Housekeeping or managing your Housekeeping? N -  Some recent data might be hidden    Fall/Depression Screening:    Fall  Risk  01/01/2017 12/24/2016 09/03/2016  Falls in the past year? Yes No No  Number falls in past yr: 2 or more - -  Injury with Fall? - - -  Risk for fall due to : Impaired mobility;Impaired balance/gait - -  Follow up - - -   PHQ 2/9 Scores 01/01/2017  PHQ - 2 Score 0    Assessment: 81 year old recently admitted with Myasthenia Gravis. History of COPD, interstitial cystitis.   RNCM completed home visit. Client reports improvement that she can now hold her head up. Ambulating without difficulty. Client has personal care assistant who has been with her for over 7 years. Client denies any questions or concerns at this time. Client reports her husband  is in the hospital.  Medications reviewed. Client manages her medications without difficulty.  Plan: transition of care next week. THN CM Care Plan Problem One     Most Recent Value  Care Plan Problem One  at risk for readmission  Role Documenting the Problem One  Care Management [place] for Problem One  Active  THN Long Term Goal   -- [client will not be rehospitalized within the next 31 days.]  THN Long Term Goal Start Date  12/13/16  Interventions for Problem One Long Term Goal  discussed improvement in pain level, encouraged continued follow up with providers, provided [facility] calender/organizer and explained how to use. encouraged client to call RNCM as needed and 24hour nurse advice line as needed.  THN CM Short Term Goal #1   client will attend appointments as scheduled within the next 30 days.  THN CM Short Term Goal #1 Start Date  12/13/16  Interventions for Short Term Goal #1  RNCM spoke with client regarding upcoming appointments, provided [facility] calender organizer as option for managing appointments.  THN CM Short Term Goal #2   client will contact primary care within the week.  THN CM Short Term Goal #2 Start Date  12/20/16  [facility] CM Short Term Goal #2 Met Date  01/01/17     Thea Silversmith, RN, MSN, [place] Coordinator Cell: [phone]

## 2017-01-03 DIAGNOSIS — N3010 Interstitial cystitis (chronic) without hematuria: Secondary | ICD-10-CM | POA: Diagnosis not present

## 2017-01-07 ENCOUNTER — Other Ambulatory Visit: Payer: Self-pay

## 2017-01-07 NOTE — Patient Outreach (Signed)
[facility] [facility]) Care Management  01/07/2017  Erica Drake [birth year] [id number]  Subjective:  "With what I have there is nothing else you can do for me. I am fine."  Objective: none  Assessment: RNCM called to complete transition of care. Client denies any issues or concerns at this time. Client is declining further follow up of [facility] services stating she is fine and that she has no care management needs. Declines further participation in transition of care program.  Plan: close case.  Thea Silversmith, RN, MSN, [place] Coordinator Cell: [phone]

## 2017-01-08 DIAGNOSIS — N3010 Interstitial cystitis (chronic) without hematuria: Secondary | ICD-10-CM | POA: Diagnosis not present

## 2017-01-10 DIAGNOSIS — R309 Painful micturition, unspecified: Secondary | ICD-10-CM | POA: Diagnosis not present

## 2017-01-10 DIAGNOSIS — N3010 Interstitial cystitis (chronic) without hematuria: Secondary | ICD-10-CM | POA: Diagnosis not present

## 2017-01-14 ENCOUNTER — Ambulatory Visit: Payer: Medicare Other | Admitting: Internal Medicine

## 2017-01-21 DIAGNOSIS — D485 Neoplasm of uncertain behavior of skin: Secondary | ICD-10-CM | POA: Diagnosis not present

## 2017-01-21 DIAGNOSIS — Z961 Presence of intraocular lens: Secondary | ICD-10-CM | POA: Diagnosis not present

## 2017-01-21 DIAGNOSIS — H52203 Unspecified astigmatism, bilateral: Secondary | ICD-10-CM | POA: Diagnosis not present

## 2017-01-21 DIAGNOSIS — N3010 Interstitial cystitis (chronic) without hematuria: Secondary | ICD-10-CM | POA: Diagnosis not present

## 2017-01-21 DIAGNOSIS — E119 Type 2 diabetes mellitus without complications: Secondary | ICD-10-CM | POA: Diagnosis not present

## 2017-01-21 LAB — HM DIABETES EYE EXAM

## 2017-01-22 DIAGNOSIS — M62838 Other muscle spasm: Secondary | ICD-10-CM | POA: Diagnosis not present

## 2017-01-22 DIAGNOSIS — M6281 Muscle weakness (generalized): Secondary | ICD-10-CM | POA: Diagnosis not present

## 2017-01-22 DIAGNOSIS — N3941 Urge incontinence: Secondary | ICD-10-CM | POA: Diagnosis not present

## 2017-01-22 DIAGNOSIS — R102 Pelvic and perineal pain: Secondary | ICD-10-CM | POA: Diagnosis not present

## 2017-01-22 DIAGNOSIS — K5900 Constipation, unspecified: Secondary | ICD-10-CM | POA: Diagnosis not present

## 2017-01-24 DIAGNOSIS — N3010 Interstitial cystitis (chronic) without hematuria: Secondary | ICD-10-CM | POA: Diagnosis not present

## 2017-01-28 DIAGNOSIS — N3010 Interstitial cystitis (chronic) without hematuria: Secondary | ICD-10-CM | POA: Diagnosis not present

## 2017-01-28 DIAGNOSIS — M6281 Muscle weakness (generalized): Secondary | ICD-10-CM | POA: Diagnosis not present

## 2017-01-28 DIAGNOSIS — M62838 Other muscle spasm: Secondary | ICD-10-CM | POA: Diagnosis not present

## 2017-01-28 DIAGNOSIS — R102 Pelvic and perineal pain: Secondary | ICD-10-CM | POA: Diagnosis not present

## 2017-01-31 DIAGNOSIS — N3010 Interstitial cystitis (chronic) without hematuria: Secondary | ICD-10-CM | POA: Diagnosis not present

## 2017-02-05 ENCOUNTER — Ambulatory Visit: Payer: Medicare Other | Admitting: Neurology

## 2017-02-07 ENCOUNTER — Ambulatory Visit (INDEPENDENT_AMBULATORY_CARE_PROVIDER_SITE_OTHER): Payer: Medicare Other | Admitting: Neurology

## 2017-02-07 ENCOUNTER — Encounter: Payer: Self-pay | Admitting: Neurology

## 2017-02-07 VITALS — BP 120/80 | HR 92 | Ht 65.0 in | Wt 156.5 lb

## 2017-02-07 DIAGNOSIS — G7001 Myasthenia gravis with (acute) exacerbation: Secondary | ICD-10-CM | POA: Diagnosis not present

## 2017-02-07 DIAGNOSIS — N3010 Interstitial cystitis (chronic) without hematuria: Secondary | ICD-10-CM | POA: Diagnosis not present

## 2017-02-07 NOTE — Patient Instructions (Addendum)
Please call Shirlean Mylar at [phone] to start IVIG  Return to clinic January 10th at 3:30pm

## 2017-02-07 NOTE — Progress Notes (Signed)
Follow-up Visit   Date: 02/07/17    Erica Drake MRN: [id number] DOB: 1936/01/13   Interim History: Erica Drake is a 81 y.o. right-handed Caucasian female with fibromyalgia, GERD, depression, left sensorineural deficit, and interstitial cystitis, type 2 diabetes mellitus returning to the clinic for follow-up of transition care management.  The patient was accompanied to the clinic by self.  History of present illness: Initial visit 09/29/2015:  In May 2017, she began experiencing double vision, with images diagonal of each other. Some mornings are much better, but as the day progresses, her double vision gets worse.  She endorses mild changes to her speech.  No difficulty with swallowing or shortness of breath.  She also complains of blurry vision in her right eye. The double vision has interfered with her driving, so she stopped this 2 months ago. She has seen her ENT, PCP, ophthalmologist, and then saw Dr. Jaynee Eagles in June who ordered MRI/A head and orbit which was normal.  Her acetylcholine antibodies returned positive.  A prescription for mestinon 21m three times daily was ordered, but patient did not start it.  She has many drug intolerances/allergies, including cortisone, so medications were started at a low dose.   She had many other chronic medical problems including chronic pain related to fibromyalgia, arthritis, and interstitial cystitis.    In the fall of 2017, I started prednisone 114mand increased mestinon to 6065mID which did not provide any relief. She stopped taking her prednisone because she has a number of other problems including diverticulosis and bronchitis, which she thought would be worsened by prednisone. Despite stopping her prednisone, there has been no change in her double vision.    UPDATE 04/18/2016:  [facility] and was recommended to have SFEMG, but she did not wish to return for testing.  Over the past two  months, she has noticed that double vision is improved and now only occurs intermittently throughout the day which she attributes to mestinon 42m43mD.  She is not taking prednisone.  She no longer uses an eye patch.  She denies double vision in the morning or difficulty swallowing.  [place], whose health is doing well.  UPDATE 09/03/2016:  She is here for 3 month appointment.  Overall, she feels that double vision and ptosis is stable.  She does not need to wear her eye patch and is complaint with mestinon 42mg68m.  She sometimes has blurry vision at end gaze when driving, but does see double.   UPDATE 12/24/2016:   She is here for hospital discharge follow-up and transition care management.  He was hospitalized from 10/12 - 12/12/2016 with MG exacerbation manifesting with head drop, ptosis, and dysphagia.  She was treated with IVIG x 5 days and reports having improvement in dysphasia and weakness.  She no longer has double vision and ptosis is only worse in the evenings.  She does not feel that she is quite as strong as she used to be and she continues to carry a neck collar, if her neck feels heavy.  She woke up today with vertigo which she has had in the past, which responds to meclizine, and is requesting refill.  UPDATE 02/07/2017:  She is here for follow-up visit.  She stopped prednisone 20mg 71muse she developed mouth sores, which she has experienced in the past.  She did not feel any benefit while taking steroids.  She continues to have generalized fatigue. When she gets tired, her head starts to feel heavy.  She denies any problems with swallowing or shortness of breath.  She is having difficulty standing up from low chairs.    Medications:  Current Outpatient Medications on File Prior to Visit  Medication Sig Dispense Refill  . albuterol (PROAIR HFA) 108 (90 Base) MCG/ACT inhaler Inhale 1-2 puffs into the lungs every 4 (four) hours as needed for wheezing or  shortness of breath.     Marland Kitchen albuterol (PROVENTIL) (2.5 MG/3ML) 0.083% nebulizer solution Take 3 mLs (2.5 mg total) by nebulization every 6 (six) hours as needed for wheezing or shortness of breath. 25 vial 11  . buPROPion (WELLBUTRIN SR) 200 MG 12 hr tablet Take 200 mg by mouth 2 (two) times daily after a meal.     . clonazePAM (KLONOPIN) 1 MG tablet Take 1 tablet by mouth See admin instructions. Take 1 tablet (1 mg) three times daily - morning, after supper and before bed  4  . Fluticasone-Umeclidin-Vilant (TRELEGY ELLIPTA) 100-62.5-25 MCG/INH AEPB Inhale 1 puff into the lungs daily. 2 each 0  . HYDROcodone-acetaminophen (NORCO) 5-325 MG tablet Take 1-2 tablets by mouth every 4 (four) hours as needed for moderate pain. 30 tablet 0  . meclizine (ANTIVERT) 12.5 MG tablet Take 1 tablet (12.5 mg total) by mouth 3 (three) times daily as needed for dizziness. 30 tablet 3  . methocarbamol (ROBAXIN) 500 MG tablet Take 1 tablet (500 mg total) by mouth 4 (four) times daily. As needed for muscle spasm 30 tablet 1  . methylcellulose (CITRUCEL) oral powder Take daily by mouth.    Marland Kitchen omeprazole (PRILOSEC) 20 MG capsule Take 20 mg daily by mouth.    Marland Kitchen PRESCRIPTION MEDICATION Inhale into the lungs at bedtime. CPAP    . pyridostigmine (MESTINON) 60 MG tablet Take 1 tablet (60 mg total) by mouth 3 (three) times daily. 270 tablet 3  . trimethoprim (TRIMPEX) 100 MG tablet Take 100 mg by mouth daily.  11  . umeclidinium-vilanterol (ANORO ELLIPTA) 62.5-25 MCG/INH AEPB Inhale 1 puff into the lungs daily. 60 each 5  . predniSONE (DELTASONE) 10 MG tablet Take 2 tablets (20 mg total) by mouth daily with breakfast. (Patient not taking: Reported on 02/07/2017) 60 tablet 5   No current facility-administered medications on file prior to visit.     Allergies:  Allergies  Allergen Reactions  . Cortisone Shortness Of Breath, Swelling and Other (See Comments)    Tongue swelling   . Amlodipine Besylate Cough  . Chlorhexidine  Gluconate Itching    Skin on face turned red   . Clindamycin Itching  . Dexamethasone Other (See Comments)    Per MD  . Duloxetine Other (See Comments)    Unknown reaction  . Morphine Sulfate Other (See Comments)    Unknown reaction  . Niacin Other (See Comments)  . Other Other (See Comments)    Pollen - nasal reaction  . Prednisone Other (See Comments)    Causes stomach pain. Sores in her mouth.   . Rosuvastatin Calcium Other (See Comments)    Muscle aches  . Statins Other (See Comments)    Muscle aches  . Sulfa Antibiotics Other (See Comments)    May possibly have caused deafness in one ear  . Valsartan Other (See Comments)    Unknown reaction  . Strawberry Extract Itching and Rash    Review of Systems:  CONSTITUTIONAL: No fevers, chills, night sweats, or weight loss.  EYES: No visual changes or eye pain ENT: No hearing changes.  No history of nose bleeds.   RESPIRATORY: No cough, wheezing and shortness of breath.   CARDIOVASCULAR: Negative for chest pain, and palpitations.   GI: Negative for abdominal discomfort, blood in stools or black stools.  No recent change in bowel habits.   GU:  No history of incontinence.   MUSCLOSKELETAL: No history of joint pain or swelling.  No myalgias.   SKIN: Negative for lesions, rash, and itching.   ENDOCRINE: Negative for cold or heat intolerance, polydipsia or goiter.   PSYCH:  + depression or anxiety symptoms.   NEURO: As Above.   Vital Signs:  BP 120/80   Pulse 92   Ht 5' 5"  (1.651 m)   Wt 156 lb 8 oz (71 kg)   SpO2 93%   BMI 26.04 kg/m   General: Well appearing, comfortable.    Neurological Exam: MENTAL STATUS including orientation to time, place, person, recent and remote memory, attention span and concentration, language, and fund of knowledge is normal.  Speech is not dysarthric.  CRANIAL NERVES:   Pupils equal round and reactive to light.  There is no ophthalmoplegia.  Left ptosis is mild, and worse with sustained  upgaze.  Face is symmetric.  Tongue is midline and strength is 5/5.  Orbicularis oculi and buccinator is 5/5.   MOTOR: Motor strength shows 4+/5 in proximal arms and legs, 5/5 distally.  Neck flexion is 4+/5.  COORDINATION/GAIT:   Gait is stable, unassisted.  Data: Labs 08/26/2015:  AChR binding (0.63) blocking (28) antibody, ESR 3, CRP 4.9  MRI/A head 09/09/2015: This MR angiogram of the intracerebral arteries shows the following: 1. Very minimal atherosclerotic change within the left posterior cerebral artery that is unlikely to be clinically significant. 2. There is a normal variant with the left posterior cerebral artery obtaining its flow from the anterior circulation. 3. No aneurysms are seen. 4. No new findings compared to the 11/25/2003 MRA.   MRI orbit wwo contrast 09/09/2015:  This is is a normal MRI of the orbits with and without contrast.  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  Encouraged patient to follow-up with contacting [facility] as this is limiting her ability to start IVIG, which she understands  - Start IVIG 0.78m/kg x 1 dose every 3-4 weeks as maintenance therapy  - Continue mestinon 671mthree times daily  Return to clinic in 4 weeks  Greater than 50% of this 25 minute visit was spent in counseling, explanation of diagnosis, planning of further management, and coordination of care.    Thank you for allowing me to participate in patient's care.  If I can answer any additional questions, I would be pleased to do  so.    Sincerely,    Liani Caris K. PaPosey ProntoDO

## 2017-02-14 DIAGNOSIS — N3010 Interstitial cystitis (chronic) without hematuria: Secondary | ICD-10-CM | POA: Diagnosis not present

## 2017-02-20 DIAGNOSIS — I10 Essential (primary) hypertension: Secondary | ICD-10-CM | POA: Diagnosis not present

## 2017-02-20 DIAGNOSIS — R52 Pain, unspecified: Secondary | ICD-10-CM | POA: Insufficient documentation

## 2017-02-20 DIAGNOSIS — M25532 Pain in left wrist: Secondary | ICD-10-CM | POA: Diagnosis not present

## 2017-02-20 DIAGNOSIS — R82998 Other abnormal findings in urine: Secondary | ICD-10-CM | POA: Diagnosis not present

## 2017-03-05 ENCOUNTER — Encounter: Payer: Self-pay | Admitting: Neurology

## 2017-03-05 ENCOUNTER — Ambulatory Visit (INDEPENDENT_AMBULATORY_CARE_PROVIDER_SITE_OTHER): Payer: Medicare Other | Admitting: Neurology

## 2017-03-05 VITALS — BP 124/84 | HR 91 | Ht 65.0 in | Wt 155.2 lb

## 2017-03-05 DIAGNOSIS — N3010 Interstitial cystitis (chronic) without hematuria: Secondary | ICD-10-CM | POA: Diagnosis not present

## 2017-03-05 DIAGNOSIS — Z79899 Other long term (current) drug therapy: Secondary | ICD-10-CM

## 2017-03-05 DIAGNOSIS — G7001 Myasthenia gravis with (acute) exacerbation: Secondary | ICD-10-CM

## 2017-03-05 NOTE — Patient Instructions (Addendum)
Check labs  We will plan to increase your IVIG  If you have any new weakness, shortness of breath, difficulty holding your head upright, please go directly to the emergency department  Return to clinic on January 31st at 11am

## 2017-03-05 NOTE — Progress Notes (Signed)
Follow-up Visit   Date: 03/05/17    Erica Drake MRN: [id number] DOB: [birth year]   Interim History: Erica Drake is a 82 y.o. right-handed Caucasian female with fibromyalgia, GERD, depression, left sensorineural deficit, and interstitial cystitis, type 2 diabetes mellitus returning to the clinic for follow-up of transition care management.  The patient was accompanied to the clinic by self.  History of present illness: Initial visit 09/29/2015:  In May 2017, she began experiencing double vision, with images diagonal of each other. Some mornings are much better, but as the day progresses, her double vision gets worse.  She endorses mild changes to her speech.  No difficulty with swallowing or shortness of breath.  She also complains of blurry vision in her right eye. The double vision has interfered with her driving, so she stopped this 2 months ago. She has seen her ENT, PCP, ophthalmologist, and then saw Dr. Jaynee Eagles in June who ordered MRI/A head and orbit which was normal.  Her acetylcholine antibodies returned positive.  A prescription for mestinon 59m three times daily was ordered, but patient did not start it.  She has many drug intolerances/allergies, including cortisone, so medications were started at a low dose.   She had many other chronic medical problems including chronic pain related to fibromyalgia, arthritis, and interstitial cystitis.    In the fall of 2017, I started prednisone 128mand increased mestinon to 604mID which did not provide any relief. She stopped taking her prednisone because she has a number of other problems including diverticulosis and bronchitis, which she thought would be worsened by prednisone. Despite stopping her prednisone, there has been no change in her double vision.  [facility] and was recommended to have SF[facility], but she did not wish to return for testing.    She was managed on mestinon 65m94mD  alone for most of 2018 due to intolerance with prednisone.  Unfortunately, she was hospitalized from 10/12 - 12/12/2016 with MG exacerbation manifesting with head drop, ptosis, and dysphagia.  She was treated with IVIG x 5 days with improvement in dysphasia and weakness.  However, neck heaviness persisted, so she was started on monthly IVIG but due to patient not taking calls from [facility], this was delayed until late December 2018.  UPDATE 02/07/2017:  She is here for follow-up visit.  She stopped prednisone 20mg98mause she developed mouth sores, which she has experienced in the past.  She did not feel any benefit while taking steroids. She continues to have generalized fatigue. When she gets tired, her head starts to feel heavy.  She denies any problems with swallowing or shortness of breath.  She is having difficulty standing up from low chairs.   UPDATE 03/05/2017:   She received IVIG about two weeks ago and did not appreciate any improvement.  In fact, she feels that her arms and legs are weaker.  She has suffered three falls since her last visit.  She has slurred speech after talking for a while.  No dysphagia or shortness of breath.  She is comfortable sleeping on one pillow in the bed.  No recent infection or illnesses.  She takes clonazepam 1mg i65mhe morning and 2mg at21mdtime.   Medications:  Current Outpatient Medications on File Prior to Visit  Medication Sig Dispense Refill  . albuterol (PROAIR HFA) 108 (90 Base) MCG/ACT inhaler Inhale 1-2 puffs into the lungs every 4 (four)  hours as needed for wheezing or shortness of breath.     Marland Kitchen albuterol (PROVENTIL) (2.5 MG/3ML) 0.083% nebulizer solution Take 3 mLs (2.5 mg total) by nebulization every 6 (six) hours as needed for wheezing or shortness of breath. 25 vial 11  . buPROPion (WELLBUTRIN SR) 200 MG 12 hr tablet Take 200 mg by mouth 2 (two) times daily after a meal.     . clonazePAM (KLONOPIN) 1 MG tablet Take 1 tablet by mouth  See admin instructions. Take 1 tablet (1 mg) three times daily - morning, after supper and before bed  4  . Fluticasone-Umeclidin-Vilant (TRELEGY ELLIPTA) 100-62.5-25 MCG/INH AEPB Inhale 1 puff into the lungs daily. 2 each 0  . HYDROcodone-acetaminophen (NORCO) 5-325 MG tablet Take 1-2 tablets by mouth every 4 (four) hours as needed for moderate pain. 30 tablet 0  . meclizine (ANTIVERT) 12.5 MG tablet Take 1 tablet (12.5 mg total) by mouth 3 (three) times daily as needed for dizziness. 30 tablet 3  . methylcellulose (CITRUCEL) oral powder Take daily by mouth.    Marland Kitchen omeprazole (PRILOSEC) 20 MG capsule Take 20 mg daily by mouth.    Marland Kitchen PRESCRIPTION MEDICATION Inhale into the lungs at bedtime. CPAP    . pyridostigmine (MESTINON) 60 MG tablet Take 1 tablet (60 mg total) by mouth 3 (three) times daily. 270 tablet 3  . trimethoprim (TRIMPEX) 100 MG tablet Take 100 mg by mouth daily.  11  . umeclidinium-vilanterol (ANORO ELLIPTA) 62.5-25 MCG/INH AEPB Inhale 1 puff into the lungs daily. 60 each 5  . methocarbamol (ROBAXIN) 500 MG tablet Take 1 tablet (500 mg total) by mouth 4 (four) times daily. As needed for muscle spasm (Patient not taking: Reported on 03/05/2017) 30 tablet 1   No current facility-administered medications on file prior to visit.     Allergies:  Allergies  Allergen Reactions  . Cortisone Shortness Of Breath, Swelling and Other (See Comments)    Tongue swelling   . Amlodipine Besylate Cough  . Chlorhexidine Gluconate Itching    Skin on face turned red   . Clindamycin Itching  . Dexamethasone Other (See Comments)    Per MD  . Duloxetine Other (See Comments)    Unknown reaction  . Morphine Sulfate Other (See Comments)    Unknown reaction  . Niacin Other (See Comments)  . Other Other (See Comments)    Pollen - nasal reaction  . Prednisone Other (See Comments)    Causes stomach pain. Sores in her mouth.   . Rosuvastatin Calcium Other (See Comments)    Muscle aches  . Statins  Other (See Comments)    Muscle aches  . Sulfa Antibiotics Other (See Comments)    May possibly have caused deafness in one ear  . Valsartan Other (See Comments)    Unknown reaction  . Strawberry Extract Itching and Rash    Review of Systems:  CONSTITUTIONAL: No fevers, chills, night sweats, or weight loss.  EYES: No visual changes or eye pain ENT: No hearing changes.  No history of nose bleeds.   RESPIRATORY: No cough, wheezing and shortness of breath.   CARDIOVASCULAR: Negative for chest pain, and palpitations.   GI: Negative for abdominal discomfort, blood in stools or black stools.  No recent change in bowel habits.   GU:  No history of incontinence.   MUSCLOSKELETAL: No history of joint pain or swelling.  No myalgias.   SKIN: Negative for lesions, rash, and itching.   ENDOCRINE: Negative for cold or heat  intolerance, polydipsia or goiter.   PSYCH:  + depression or anxiety symptoms.   NEURO: As Above.   Vital Signs:  BP 124/84   Pulse 91   Ht 5' 5"  (1.651 m)   Wt 155 lb 4 oz (70.4 kg)   SpO2 91%   BMI 25.83 kg/m   General: Tired appearing, comfortable.    Neurological Exam: MENTAL STATUS including orientation to time, place, person, recent and remote memory, attention span and concentration, language, and fund of knowledge is normal.  Speech is mildly slurred.  CRANIAL NERVES:   Pupils equal round and reactive to light.  There is no ophthalmoplegia.  Ptosis is subtle on the left, but does not worsen with sustained upgaze.  Face is symmetric. Orbicularis oculi and oris muscles are 5/5.  There is mild weakness with maintaining air in cheeks.   Tongue is midline and strength is 5/5.     MOTOR: Motor strength continues to be 4+/5 throughout proximally, 5/5 distally.  Neck flexion is 4+/5 (stable from last visit).  COORDINATION/GAIT:   Gait is stable, assisted with cane   Data: Labs 08/26/2015:  AChR binding (0.63) blocking (28) antibody, ESR 3, CRP 4.9  MRI/A head  09/09/2015: This MR angiogram of the intracerebral arteries shows the following: 1. Very minimal atherosclerotic change within the left posterior cerebral artery that is unlikely to be clinically significant. 2. There is a normal variant with the left posterior cerebral artery obtaining its flow from the anterior circulation. 3. No aneurysms are seen. 4. No new findings compared to the 11/25/2003 MRA.   MRI orbit wwo contrast 09/09/2015:  This is is a normal MRI of the orbits with and without contrast.  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  She hospitalized and responded to IVIG  - She stopped prednisone due to mouth sores and has intolerance to this in the past  - She received IVIG in late December but continues to have generalized >> bulbar weakness and risk of neurological deterioration is high. I will contact [facility] and have them start induction dose of IVIG 0.36m/kg x 5 days, then restart maintenance therapy  - Patient counseled that because myasthenia has generalized and is no long purely ocular, she will need long term immunosuppressant therapy.  She is very sensitive to medications, so will need to monitor for side effects.   - Check TMPT for plans to start azathioprine.  Side effects discussed.  She will need frequent lab monitoring while on this  - I will request recent CBC and CMP from her PCP's office as patient reports having labs done last week  - Continue mestinon 673mthree times daily  - She was again reminded of the warning signs indicating worsening exacerbation/crisis and to seek medical attention immediately  Return to clinic in 2-3 weeks  Greater than 50% of this 30 minute visit was spent in counseling, explanation of diagnosis, planning of  further management, and coordination of care.   Thank you for allowing me to participate in patient's care.  If I can answer any additional questions, I would be pleased to do so.    Sincerely,    Donika K. PaPosey ProntoDO

## 2017-03-06 DIAGNOSIS — Z6824 Body mass index (BMI) 24.0-24.9, adult: Secondary | ICD-10-CM | POA: Diagnosis not present

## 2017-03-06 DIAGNOSIS — I10 Essential (primary) hypertension: Secondary | ICD-10-CM | POA: Diagnosis not present

## 2017-03-06 DIAGNOSIS — Z9889 Other specified postprocedural states: Secondary | ICD-10-CM | POA: Insufficient documentation

## 2017-03-06 DIAGNOSIS — M25561 Pain in right knee: Secondary | ICD-10-CM | POA: Diagnosis not present

## 2017-03-06 DIAGNOSIS — G7000 Myasthenia gravis without (acute) exacerbation: Secondary | ICD-10-CM | POA: Diagnosis not present

## 2017-03-06 DIAGNOSIS — M170 Bilateral primary osteoarthritis of knee: Secondary | ICD-10-CM | POA: Diagnosis not present

## 2017-03-06 DIAGNOSIS — J42 Unspecified chronic bronchitis: Secondary | ICD-10-CM | POA: Diagnosis not present

## 2017-03-06 DIAGNOSIS — Z1389 Encounter for screening for other disorder: Secondary | ICD-10-CM | POA: Diagnosis not present

## 2017-03-06 DIAGNOSIS — R42 Dizziness and giddiness: Secondary | ICD-10-CM | POA: Diagnosis not present

## 2017-03-06 DIAGNOSIS — Z0000 Encounter for general adult medical examination without abnormal findings: Secondary | ICD-10-CM | POA: Diagnosis not present

## 2017-03-06 DIAGNOSIS — N3010 Interstitial cystitis (chronic) without hematuria: Secondary | ICD-10-CM | POA: Diagnosis not present

## 2017-03-06 DIAGNOSIS — J45998 Other asthma: Secondary | ICD-10-CM | POA: Diagnosis not present

## 2017-03-06 DIAGNOSIS — M797 Fibromyalgia: Secondary | ICD-10-CM | POA: Diagnosis not present

## 2017-03-06 DIAGNOSIS — M25562 Pain in left knee: Secondary | ICD-10-CM | POA: Diagnosis not present

## 2017-03-06 DIAGNOSIS — F3289 Other specified depressive episodes: Secondary | ICD-10-CM | POA: Diagnosis not present

## 2017-03-08 ENCOUNTER — Ambulatory Visit: Payer: Medicare Other | Admitting: Neurology

## 2017-03-11 ENCOUNTER — Telehealth: Payer: Self-pay | Admitting: Neurology

## 2017-03-11 NOTE — Telephone Encounter (Signed)
Erica Drake is looking into financial aid for patient and they will contact patient.

## 2017-03-11 NOTE — Telephone Encounter (Signed)
I have sent Erica Drake a message to check in to this.

## 2017-03-11 NOTE — Telephone Encounter (Signed)
Pt left a voicemail message asking for a call back today and did not say why

## 2017-03-11 NOTE — Telephone Encounter (Signed)
Erica Drake has contacted the patient to see if maybe she can do outpatient therapy but she has not called them back to let them know anything.

## 2017-03-11 NOTE — Telephone Encounter (Signed)
Patient Lmom regarding not receiving her medication this morning? She said there was a Careers information officer and it was not delivered? Thanks

## 2017-03-12 DIAGNOSIS — Z1212 Encounter for screening for malignant neoplasm of rectum: Secondary | ICD-10-CM | POA: Diagnosis not present

## 2017-03-12 NOTE — Telephone Encounter (Signed)
Carley Hammed is sending med to patient today.

## 2017-03-19 DIAGNOSIS — N3010 Interstitial cystitis (chronic) without hematuria: Secondary | ICD-10-CM | POA: Diagnosis not present

## 2017-03-27 DIAGNOSIS — G7000 Myasthenia gravis without (acute) exacerbation: Secondary | ICD-10-CM | POA: Diagnosis not present

## 2017-03-27 DIAGNOSIS — N816 Rectocele: Secondary | ICD-10-CM | POA: Diagnosis not present

## 2017-03-27 DIAGNOSIS — K5902 Outlet dysfunction constipation: Secondary | ICD-10-CM | POA: Diagnosis not present

## 2017-03-28 ENCOUNTER — Other Ambulatory Visit: Payer: Medicare Other

## 2017-03-28 ENCOUNTER — Ambulatory Visit (INDEPENDENT_AMBULATORY_CARE_PROVIDER_SITE_OTHER): Payer: Medicare Other | Admitting: Neurology

## 2017-03-28 ENCOUNTER — Encounter: Payer: Self-pay | Admitting: Neurology

## 2017-03-28 VITALS — BP 130/70 | HR 99 | Ht 65.0 in | Wt 155.2 lb

## 2017-03-28 DIAGNOSIS — G7001 Myasthenia gravis with (acute) exacerbation: Secondary | ICD-10-CM

## 2017-03-28 NOTE — Progress Notes (Signed)
Follow-up Visit   Date: 03/28/17    Erica Drake MRN: [id number] DOB: [birth year]   Interim History: Erica Drake is a 82 y.o. right-handed Caucasian female with fibromyalgia, GERD, depression, left sensorineural deficit, and interstitial cystitis, type 2 diabetes mellitus returning to the clinic for follow-up of transition care management.  The patient was accompanied to the clinic by self.  History of present illness: Initial visit 09/29/2015:  In May 2017, she began experiencing double vision, with images diagonal of each other. Some mornings are much better, but as the day progresses, her double vision gets worse.  She endorses mild changes to her speech.  No difficulty with swallowing or shortness of breath.  She also complains of blurry vision in her right eye. The double vision has interfered with her driving, so she stopped this 2 months ago. She has seen her ENT, PCP, ophthalmologist, and then saw Dr. Jaynee Eagles in June who ordered MRI/A head and orbit which was normal.  Her acetylcholine antibodies returned positive.  A prescription for mestinon 25m three times daily was ordered, but patient did not start it.  She has many drug intolerances/allergies, including cortisone, so medications were started at a low dose.   She had many other chronic medical problems including chronic pain related to fibromyalgia, arthritis, and interstitial cystitis.    In the fall of 2017, I started prednisone 145mand increased mestinon to 6038mID which did not provide any relief. She stopped taking her prednisone because she has a number of other problems including diverticulosis and bronchitis, which she thought would be worsened by prednisone. Despite stopping her prednisone, there has been no change in her double vision.  [facility] and was recommended to have SFEM[facility], but she did not wish to return for testing.    She was managed on mestinon 45m80mD  alone for most of 2018 due to intolerance with prednisone.  Unfortunately, she was hospitalized from 10/12 - 12/12/2016 with MG exacerbation manifesting with head drop, ptosis, and dysphagia.  She was treated with IVIG x 5 days with improvement in dysphasia and weakness.  However, neck heaviness persisted, so she was started on monthly IVIG but due to patient not taking calls from [facility], this was delayed until late December 2018.  UPDATE 02/07/2017:  She is here for follow-up visit.  She stopped prednisone 20mg34mause she developed mouth sores, which she has experienced in the past.  She did not feel any benefit while taking steroids. She continues to have generalized fatigue. When she gets tired, her head starts to feel heavy.  She denies any problems with swallowing or shortness of breath.  She is having difficulty standing up from low chairs.   UPDATE 03/05/2017:   She received IVIG about two weeks ago and did not appreciate any improvement.  In fact, she feels that her arms and legs are weaker.  She has suffered three falls since her last visit.  She has slurred speech after talking for a while.  No dysphagia or shortness of breath.  She is comfortable sleeping on one pillow in the bed.  No recent infection or illnesses.  She takes clonazepam 1mg i64mhe morning and 2mg at54mdtime.   UPDATE 03/28/2017:  She is here for 3 week follow-up visit.  Since getting IVIG, she has noticed increased strength of the neck, arms, and legs, but does not feel back to her usual  state.  She continues to use a cane or walker as needed and has blurred vision and droopy eyelids especially in the evening.  She has suffered a few falls, especially when getting out of her bed.  She is mourning the loss of her husband who passed away earlier this week.    Medications:  Current Outpatient Medications on File Prior to Visit  Medication Sig Dispense Refill  . albuterol (PROAIR HFA) 108 (90 Base) MCG/ACT inhaler  Inhale 1-2 puffs into the lungs every 4 (four) hours as needed for wheezing or shortness of breath.     Marland Kitchen albuterol (PROVENTIL) (2.5 MG/3ML) 0.083% nebulizer solution Take 3 mLs (2.5 mg total) by nebulization every 6 (six) hours as needed for wheezing or shortness of breath. 25 vial 11  . buPROPion (WELLBUTRIN SR) 200 MG 12 hr tablet Take 200 mg by mouth 2 (two) times daily after a meal.     . clonazePAM (KLONOPIN) 1 MG tablet Take 1 tablet by mouth See admin instructions. Take 1 tablet (1 mg) three times daily - morning, after supper and before bed  4  . Fluticasone-Umeclidin-Vilant (TRELEGY ELLIPTA) 100-62.5-25 MCG/INH AEPB Inhale 1 puff into the lungs daily. 2 each 0  . HYDROcodone-acetaminophen (NORCO) 5-325 MG tablet Take 1-2 tablets by mouth every 4 (four) hours as needed for moderate pain. 30 tablet 0  . meclizine (ANTIVERT) 12.5 MG tablet Take 1 tablet (12.5 mg total) by mouth 3 (three) times daily as needed for dizziness. 30 tablet 3  . methocarbamol (ROBAXIN) 500 MG tablet Take 1 tablet (500 mg total) by mouth 4 (four) times daily. As needed for muscle spasm 30 tablet 1  . methylcellulose (CITRUCEL) oral powder Take daily by mouth.    Marland Kitchen omeprazole (PRILOSEC) 20 MG capsule Take 20 mg daily by mouth.    Marland Kitchen PRESCRIPTION MEDICATION Inhale into the lungs at bedtime. CPAP    . pyridostigmine (MESTINON) 60 MG tablet Take 1 tablet (60 mg total) by mouth 3 (three) times daily. 270 tablet 3  . trimethoprim (TRIMPEX) 100 MG tablet Take 100 mg by mouth daily.  11  . umeclidinium-vilanterol (ANORO ELLIPTA) 62.5-25 MCG/INH AEPB Inhale 1 puff into the lungs daily. 60 each 5   No current facility-administered medications on file prior to visit.     Allergies:  Allergies  Allergen Reactions  . Cortisone Shortness Of Breath, Swelling and Other (See Comments)    Tongue swelling   . Amlodipine Besylate Cough  . Chlorhexidine Gluconate Itching    Skin on face turned red   . Clindamycin Itching  .  Dexamethasone Other (See Comments)    Per MD  . Duloxetine Other (See Comments)    Unknown reaction  . Morphine Sulfate Other (See Comments)    Unknown reaction  . Niacin Other (See Comments)  . Other Other (See Comments)    Pollen - nasal reaction  . Prednisone Other (See Comments)    Causes stomach pain. Sores in her mouth.   . Rosuvastatin Calcium Other (See Comments)    Muscle aches  . Statins Other (See Comments)    Muscle aches  . Sulfa Antibiotics Other (See Comments)    May possibly have caused deafness in one ear  . Valsartan Other (See Comments)    Unknown reaction  . Strawberry Extract Itching and Rash    Review of Systems:  CONSTITUTIONAL: No fevers, chills, night sweats, or weight loss.  EYES: No visual changes or eye pain ENT: No hearing changes.  No history of nose bleeds.   RESPIRATORY: No cough, wheezing and shortness of breath.   CARDIOVASCULAR: Negative for chest pain, and palpitations.   GI: Negative for abdominal discomfort, blood in stools or black stools.  No recent change in bowel habits.   GU:  No history of incontinence.   MUSCLOSKELETAL: No history of joint pain or swelling.  No myalgias.   SKIN: Negative for lesions, rash, and itching.   ENDOCRINE: Negative for cold or heat intolerance, polydipsia or goiter.   PSYCH:  + depression or anxiety symptoms.   NEURO: As Above.   Vital Signs:  BP 130/70   Pulse 99   Ht 5' 5" (1.651 m)   Wt 155 lb 4 oz (70.4 kg)   SpO2 96%   BMI 25.83 kg/m   General: Well-appearing, comfortable.   Neurological Exam: MENTAL STATUS including orientation to time, place, person, recent and remote memory, attention span and concentration, language, and fund of knowledge is normal.  Speech is normal, no dysarthria (imrproved)  CRANIAL NERVES:   Pupils equal round and reactive to light.  There is no ophthalmoplegia.  Ptosis is subtle on the left, but does not worsen with sustained upgaze.  Face is symmetric. Orbicularis  oculi and oris muscles are 5/5. Buccinator muscles are 5/5 (improved) Tongue is midline and strength is 5/5.     MOTOR: Motor strength in the upper extremities is 5/5 and 5-/5 in the legs bilaterally.  Neck flexion is 5-/5 (improved).  There is no fatigability on exam.  COORDINATION/GAIT:   Gait is slow, unassisted.  She is unable to stand to rise from the chair without using arms to push off.   Data: Labs 08/26/2015:  AChR binding (0.63) blocking (28) antibody, ESR 3, CRP 4.9  MRI/A head 09/09/2015: This MR angiogram of the intracerebral arteries shows the following: 1. Very minimal atherosclerotic change within the left posterior cerebral artery that is unlikely to be clinically significant. 2. There is a normal variant with the left posterior cerebral artery obtaining its flow from the anterior circulation. 3. No aneurysms are seen. 4. No new findings compared to the 11/25/2003 MRA.   MRI orbit wwo contrast 09/09/2015:  This is is a normal MRI of the orbits with and without contrast.  CT chest w contrast 12/08/2016:  No evidence of thymoma  IMPRESSION/PLAN: Seropositive generalized myasthenia gravis (diagnosed June 2017) without exacerbation, clinically doing better after induction dose of IVIG last week  - Until October 2018, she only had ocular symptoms which then generalized manifesting with head drop.  She hospitalized and responded to IVIG  - She stopped prednisone due to mouth sores and has intolerance to this in the past  - She has responded to IVIG with improved proximal strength and because of inability to control the disease with corticosteroids (mouth sores), maintenance dose of 477m/kg every 3 weeks will be continued.  - Check TMPT, if normal, plan to start azathioprine 575mx 2 weeks, then 10037m.   - She will need regular laboratory testing while initiating azathioprine with CBC and CMP weekly x 4 weeks, then monthly x 3, then every 3 months.  - Continue  mestinon 77m65mree times daily  Return to clinic in 3 weeks  Greater than 50% of this 30 minute visit was spent in counseling, explanation of diagnosis, planning of further management, and coordination of care.    Thank you for allowing me to participate in patient's care.  If I can answer any additional questions,  I would be pleased to do so.    Sincerely,    Lasharon Dunivan K. Posey Pronto, DO

## 2017-03-28 NOTE — Patient Instructions (Addendum)
1. Continue IVIG every 3 weeks  2.  Start azathioprine 50mg  daily for 2 weeks, then increase to 100mg  daily 3. Check CBC and CMP weekly x 4 weeks, then monthly x 3 months, then every 3 months

## 2017-03-29 DIAGNOSIS — N3010 Interstitial cystitis (chronic) without hematuria: Secondary | ICD-10-CM | POA: Diagnosis not present

## 2017-04-02 DIAGNOSIS — N3010 Interstitial cystitis (chronic) without hematuria: Secondary | ICD-10-CM | POA: Diagnosis not present

## 2017-04-03 LAB — THIOPURINE METHYLTRANSFERASE (TPMT), RBC: THIOPURINE METHYLTRANSFERASE, RBC: 18 nmol/h/mL

## 2017-04-04 DIAGNOSIS — N3010 Interstitial cystitis (chronic) without hematuria: Secondary | ICD-10-CM | POA: Diagnosis not present

## 2017-04-04 DIAGNOSIS — F341 Dysthymic disorder: Secondary | ICD-10-CM | POA: Diagnosis not present

## 2017-04-05 ENCOUNTER — Other Ambulatory Visit: Payer: Self-pay | Admitting: *Deleted

## 2017-04-05 ENCOUNTER — Telehealth: Payer: Self-pay | Admitting: Internal Medicine

## 2017-04-05 ENCOUNTER — Telehealth: Payer: Self-pay | Admitting: *Deleted

## 2017-04-05 MED ORDER — AZATHIOPRINE 50 MG PO TABS: 50.0000 mg | ORAL_TABLET | Freq: Every day | ORAL | 5 refills | Status: DC

## 2017-04-05 MED ORDER — ALBUTEROL SULFATE (2.5 MG/3ML) 0.083% IN NEBU: 2.5000 mg | INHALATION_SOLUTION | Freq: Four times a day (QID) | RESPIRATORY_TRACT | 5 refills | Status: DC | PRN

## 2017-04-05 MED ORDER — AMOXICILLIN-POT CLAVULANATE 875-125 MG PO TABS: 1.0000 | ORAL_TABLET | Freq: Two times a day (BID) | ORAL | 0 refills | Status: DC

## 2017-04-05 NOTE — Telephone Encounter (Signed)
Spoke with pt, she states she is having symptoms of a sinus infection. She is having face pain, ear pain, and pain on the left side for 1 week. She denies fever but does have yellow mucus from her nose. Snd feels she is stressed because of her husband passing away in January. She states she had to use the Anoro instead of the Bevespi due to insurance coverage. She was supposed to come in but her husband was in the hospital so she missed her appt. Please advise.   She has tried Mucinex, Sinus Max, Tylenol, Sudafed  [facility] [address]  Current Outpatient Medications on File Prior to Visit  Medication Sig Dispense Refill  . albuterol (PROAIR HFA) 108 (90 Base) MCG/ACT inhaler Inhale 1-2 puffs into the lungs every 4 (four) hours as needed for wheezing or shortness of breath.     Marland Kitchen albuterol (PROVENTIL) (2.5 MG/3ML) 0.083% nebulizer solution Take 3 mLs (2.5 mg total) by nebulization every 6 (six) hours as needed for wheezing or shortness of breath. 25 vial 11  . buPROPion (WELLBUTRIN SR) 200 MG 12 hr tablet Take 200 mg by mouth 2 (two) times daily after a meal.     . clonazePAM (KLONOPIN) 1 MG tablet Take 1 tablet by mouth See admin instructions. Take 1 tablet (1 mg) three times daily - morning, after supper and before bed  4  . Fluticasone-Umeclidin-Vilant (TRELEGY ELLIPTA) 100-62.5-25 MCG/INH AEPB Inhale 1 puff into the lungs daily. 2 each 0  . HYDROcodone-acetaminophen (NORCO) 5-325 MG tablet Take 1-2 tablets by mouth every 4 (four) hours as needed for moderate pain. 30 tablet 0  . meclizine (ANTIVERT) 12.5 MG tablet Take 1 tablet (12.5 mg total) by mouth 3 (three) times daily as needed for dizziness. 30 tablet 3  . methocarbamol (ROBAXIN) 500 MG tablet Take 1 tablet (500 mg total) by mouth 4 (four) times daily. As needed for muscle spasm 30 tablet 1  . methylcellulose (CITRUCEL) oral powder Take daily by mouth.    Marland Kitchen omeprazole (PRILOSEC) 20 MG capsule Take 20 mg daily by mouth.    Marland Kitchen  PRESCRIPTION MEDICATION Inhale into the lungs at bedtime. CPAP    . pyridostigmine (MESTINON) 60 MG tablet Take 1 tablet (60 mg total) by mouth 3 (three) times daily. 270 tablet 3  . trimethoprim (TRIMPEX) 100 MG tablet Take 100 mg by mouth daily.  11  . umeclidinium-vilanterol (ANORO ELLIPTA) 62.5-25 MCG/INH AEPB Inhale 1 puff into the lungs daily. 60 each 5   No current facility-administered medications on file prior to visit.    Allergies  Allergen Reactions  . Cortisone Shortness Of Breath, Swelling and Other (See Comments)    Tongue swelling   . Amlodipine Besylate Cough  . Chlorhexidine Gluconate Itching    Skin on face turned red   . Clindamycin Itching  . Dexamethasone Other (See Comments)    Per MD  . Duloxetine Other (See Comments)    Unknown reaction  . Morphine Sulfate Other (See Comments)    Unknown reaction  . Niacin Other (See Comments)  . Other Other (See Comments)    Pollen - nasal reaction  . Prednisone Other (See Comments)    Causes stomach pain. Sores in her mouth.   . Rosuvastatin Calcium Other (See Comments)    Muscle aches  . Statins Other (See Comments)    Muscle aches  . Sulfa Antibiotics Other (See Comments)    May possibly have caused deafness in one ear  . Valsartan  Other (See Comments)    Unknown reaction  . Strawberry Extract Itching and Rash

## 2017-04-05 NOTE — Telephone Encounter (Signed)
CY can we refill the Anoro, she has another message (sick). She did say her insurance wouldn't pay for bevespi but you gave her. Trelegy last OV. Please advise.   Instructions      Return in about 4 months (around 01/14/2017).  Order- schedule unattended HST      Dx OSA  Sample x 2 Trelegy  Ellipta    Inhale 1 puff, then rinse mouth, once daily    Try this instead of Anoro. When you run out of the samples, go back to Anoro for comparison.   Please call as needed     After Visit Summary (Printed 09/13/2016)  Current Outpatient Medications on File Prior to Visit  Medication Sig Dispense Refill  . albuterol (PROAIR HFA) 108 (90 Base) MCG/ACT inhaler Inhale 1-2 puffs into the lungs every 4 (four) hours as needed for wheezing or shortness of breath.     Marland Kitchen albuterol (PROVENTIL) (2.5 MG/3ML) 0.083% nebulizer solution Take 3 mLs (2.5 mg total) by nebulization every 6 (six) hours as needed for wheezing or shortness of breath. 25 vial 11  . buPROPion (WELLBUTRIN SR) 200 MG 12 hr tablet Take 200 mg by mouth 2 (two) times daily after a meal.     . clonazePAM (KLONOPIN) 1 MG tablet Take 1 tablet by mouth See admin instructions. Take 1 tablet (1 mg) three times daily - morning, after supper and before bed  4  . Fluticasone-Umeclidin-Vilant (TRELEGY ELLIPTA) 100-62.5-25 MCG/INH AEPB Inhale 1 puff into the lungs daily. 2 each 0  . HYDROcodone-acetaminophen (NORCO) 5-325 MG tablet Take 1-2 tablets by mouth every 4 (four) hours as needed for moderate pain. 30 tablet 0  . meclizine (ANTIVERT) 12.5 MG tablet Take 1 tablet (12.5 mg total) by mouth 3 (three) times daily as needed for dizziness. 30 tablet 3  . methocarbamol (ROBAXIN) 500 MG tablet Take 1 tablet (500 mg total) by mouth 4 (four) times daily. As needed for muscle spasm 30 tablet 1  . methylcellulose (CITRUCEL) oral powder Take daily by mouth.    Marland Kitchen omeprazole (PRILOSEC) 20 MG capsule Take 20 mg daily by mouth.    Marland Kitchen PRESCRIPTION MEDICATION Inhale into  the lungs at bedtime. CPAP    . pyridostigmine (MESTINON) 60 MG tablet Take 1 tablet (60 mg total) by mouth 3 (three) times daily. 270 tablet 3  . trimethoprim (TRIMPEX) 100 MG tablet Take 100 mg by mouth daily.  11  . umeclidinium-vilanterol (ANORO ELLIPTA) 62.5-25 MCG/INH AEPB Inhale 1 puff into the lungs daily. 60 each 5   No current facility-administered medications on file prior to visit.     Allergies  Allergen Reactions  . Cortisone Shortness Of Breath, Swelling and Other (See Comments)    Tongue swelling   . Amlodipine Besylate Cough  . Chlorhexidine Gluconate Itching    Skin on face turned red   . Clindamycin Itching  . Dexamethasone Other (See Comments)    Per MD  . Duloxetine Other (See Comments)    Unknown reaction  . Morphine Sulfate Other (See Comments)    Unknown reaction  . Niacin Other (See Comments)  . Other Other (See Comments)    Pollen - nasal reaction  . Prednisone Other (See Comments)    Causes stomach pain. Sores in her mouth.   . Rosuvastatin Calcium Other (See Comments)    Muscle aches  . Statins Other (See Comments)    Muscle aches  . Sulfa Antibiotics Other (See Comments)    May possibly have  caused deafness in one ear  . Valsartan Other (See Comments)    Unknown reaction  . Strawberry Extract Itching and Rash

## 2017-04-05 NOTE — Telephone Encounter (Signed)
-----   Message from Alda Berthold, DO sent at 04/04/2017  2:34 PM EST ----- Please inform patient labs all look good.  Let's start azathioprine 50mg  daily for 2 weeks, then increase to 100mg  daily.  Please send Rx #60, 5 refills. She will need regular blood work - check CBC and CMP weekly x 4 weeks, then monthly x 3 months, then every 3 months

## 2017-04-05 NOTE — Telephone Encounter (Signed)
Patient given results and Rx sent in.

## 2017-04-05 NOTE — Telephone Encounter (Signed)
Spoke with patient. She is aware of CY's recs. Will go ahead and call in medication for her. She has also requested a refill on her albuterol nebs, she is aware that that will be called in as well.   Nothing else needed at time of call.

## 2017-04-05 NOTE — Telephone Encounter (Signed)
Suggest amoxacillin 500 mg, # 14, 1 twice daily

## 2017-04-08 ENCOUNTER — Telehealth: Payer: Self-pay | Admitting: Neurology

## 2017-04-08 MED ORDER — UMECLIDINIUM-VILANTEROL 62.5-25 MCG/INH IN AEPB: 1.0000 | INHALATION_SPRAY | Freq: Every day | RESPIRATORY_TRACT | 5 refills | Status: DC

## 2017-04-08 NOTE — Telephone Encounter (Signed)
Patient would like to speak with you regarding a medication for her Myasthenia Gravis. She said she was getting the run around with the Pharmacy. Please call. Thanks

## 2017-04-08 NOTE — Telephone Encounter (Signed)
CY - please advise, thanks! 

## 2017-04-08 NOTE — Telephone Encounter (Signed)
Ok to refill Anoro, or change to Trelegy- whichever she prefers.

## 2017-04-08 NOTE — Telephone Encounter (Signed)
rx sent to preferred pharmacy.  Pt aware.  Nothing further needed.  

## 2017-04-09 DIAGNOSIS — N3010 Interstitial cystitis (chronic) without hematuria: Secondary | ICD-10-CM | POA: Diagnosis not present

## 2017-04-09 NOTE — Telephone Encounter (Signed)
I spoke with patient and she said that the medication is azathiprine.  Informed her that I will call pharmacy and see what needs to be done.

## 2017-04-10 ENCOUNTER — Telehealth: Payer: Self-pay | Admitting: Neurology

## 2017-04-10 DIAGNOSIS — M170 Bilateral primary osteoarthritis of knee: Secondary | ICD-10-CM | POA: Diagnosis not present

## 2017-04-10 NOTE — Telephone Encounter (Signed)
[employer] left a voicemail message asking for a call back regarding a request for info on a prescription for pt [phone] OPT#[id number]

## 2017-04-10 NOTE — Telephone Encounter (Signed)
Hassan Rowan with [facility] [employer]mom regarding medication being approved from 04/09/17 through 04/09/18. If any questions call [phone] OPT # 5. Thanks

## 2017-04-10 NOTE — Telephone Encounter (Signed)
Called and submitted clinical information.

## 2017-04-11 DIAGNOSIS — N3010 Interstitial cystitis (chronic) without hematuria: Secondary | ICD-10-CM | POA: Diagnosis not present

## 2017-04-15 DIAGNOSIS — M1611 Unilateral primary osteoarthritis, right hip: Secondary | ICD-10-CM | POA: Diagnosis not present

## 2017-04-15 DIAGNOSIS — M170 Bilateral primary osteoarthritis of knee: Secondary | ICD-10-CM | POA: Diagnosis not present

## 2017-04-17 DIAGNOSIS — M170 Bilateral primary osteoarthritis of knee: Secondary | ICD-10-CM | POA: Diagnosis not present

## 2017-04-18 ENCOUNTER — Encounter: Payer: Self-pay | Admitting: Neurology

## 2017-04-18 ENCOUNTER — Ambulatory Visit (INDEPENDENT_AMBULATORY_CARE_PROVIDER_SITE_OTHER): Payer: Medicare Other | Admitting: Neurology

## 2017-04-18 VITALS — BP 140/84 | HR 86 | Ht 65.0 in | Wt 156.0 lb

## 2017-04-18 DIAGNOSIS — G7001 Myasthenia gravis with (acute) exacerbation: Secondary | ICD-10-CM | POA: Diagnosis not present

## 2017-04-18 DIAGNOSIS — Z79899 Other long term (current) drug therapy: Secondary | ICD-10-CM | POA: Diagnosis not present

## 2017-04-18 DIAGNOSIS — N3010 Interstitial cystitis (chronic) without hematuria: Secondary | ICD-10-CM | POA: Diagnosis not present

## 2017-04-18 MED ORDER — PREDNISONE 5 MG PO TABS
10.0000 mg | ORAL_TABLET | Freq: Every day | ORAL | 3 refills | Status: DC
Start: 2017-04-18 — End: 2017-05-09

## 2017-04-18 NOTE — Progress Notes (Signed)
Follow-up Visit   Date: 04/18/17    AERYN MEDICI MRN: [id number] DOB: 15-May-1935   Interim History: DIANIA CO is a 82 y.o. right-handed Caucasian female with fibromyalgia, GERD, depression, left sensorineural deficit, interstitial cystitis, and type 2 diabetes mellitus returning to the clinic for follow-up of myasthenia gravis.  The patient was accompanied to the clinic by self.  History of present illness: Initial visit 09/29/2015:  In May 2017, she began experiencing double vision, with images diagonal of each other. Some mornings are much better, but as the day progresses, her double vision gets worse.  She endorses mild changes to her speech.  She also complains of blurry vision in her right eye. The double vision has interfered with her driving, so she stopped this 2 months ago. She has seen her ENT, PCP, ophthalmologist, and then saw Dr. Jaynee Eagles in June who ordered MRI/A head and orbit which was normal.  Her acetylcholine antibodies returned positive.  A prescription for mestinon 74m three times daily was ordered, but patient did not start it.  She has many drug intolerances/allergies, including cortisone, so medications were started at a low dose.   She had many other chronic medical problems including chronic pain related to fibromyalgia, arthritis, and interstitial cystitis.    In the fall of 2017, I started prednisone 142mand increased mestinon to 6052mID which did not provide any relief. She stopped taking her prednisone because she has a number of other problems including diverticulosis and bronchitis, which she thought would be worsened by prednisone. Despite stopping her prednisone, there has been no change in her double vision.  [facility] and was recommended to have SFEMG, but she did not wish to return for testing.    She was managed on mestinon 91m46mD alone for most of 2018 due to intolerance with prednisone.   Unfortunately, she was hospitalized from 10/12 - 12/12/2016 with MG exacerbation manifesting with head drop, ptosis, and dysphagia.  She was treated with IVIG x 5 days with improvement in dysphasia and weakness.  However, neck heaviness persisted, so she was started on monthly IVIG.  She was also on prednisone 20mg41m discontinued this after developing mouth sores. She continues to have generalized fatigue. When she gets tired, her head starts to feel heavy.   UPDATE 03/05/2017:   She received IVIG about two weeks ago and did not appreciate any improvement.  In fact, she feels that her arms and legs are weaker.  She has suffered three falls since her last visit.  She has slurred speech after talking for a while.  No dysphagia or shortness of breath.  She is comfortable sleeping on one pillow in the bed.  No recent infection or illnesses.  She takes clonazepam 1mg i64mhe morning and 2mg at66mdtime.   UPDATE 03/28/2017:  She is here for 3 week follow-up visit.  Since getting IVIG, she has noticed increased strength of the neck, arms, and legs, but does not feel back to her usual state.  She continues to use a cane or walker as needed and has blurred vision and droopy eyelids especially in the evening.  She has suffered a few falls, especially when getting out of her bed.  She is mourning the loss of her husband who passed away earlier this week.    UPDATE 04/18/2017:  She is here for follow-up visit.  She had her IVIG 400mg/kg45mays ago  and does not feel any improvement with this dose.  In fact, she feels that her arms and legs are weaker and uses a cane to walk.  She continues to have neck heaviness and soreness.  No new double vision, dysarthria, dysphagia, or shortness of breath.  Her azathioprine was finally approved which she started 2 days ago and tolerating well so far.  She will be due for labs next week.      Medications:  Current Outpatient Medications on File Prior to Visit  Medication Sig Dispense  Refill  . albuterol (PROAIR HFA) 108 (90 Base) MCG/ACT inhaler Inhale 1-2 puffs into the lungs every 4 (four) hours as needed for wheezing or shortness of breath.     Marland Kitchen albuterol (PROVENTIL) (2.5 MG/3ML) 0.083% nebulizer solution Take 3 mLs (2.5 mg total) by nebulization every 6 (six) hours as needed for wheezing or shortness of breath. 25 vial 5  . azaTHIOprine (IMURAN) 50 MG tablet Take 1 tablet (50 mg total) by mouth daily. Take 50 mg daily x 2 weeks then increase to 100 mg daily. 60 tablet 5  . buPROPion (WELLBUTRIN SR) 200 MG 12 hr tablet Take 200 mg by mouth 2 (two) times daily after a meal.     . clonazePAM (KLONOPIN) 1 MG tablet Take 1 tablet by mouth See admin instructions. Take 1 tablet (1 mg) three times daily - morning, after supper and before bed  4  . Fluticasone-Umeclidin-Vilant (TRELEGY ELLIPTA) 100-62.5-25 MCG/INH AEPB Inhale 1 puff into the lungs daily. 2 each 0  . HYDROcodone-acetaminophen (NORCO) 5-325 MG tablet Take 1-2 tablets by mouth every 4 (four) hours as needed for moderate pain. 30 tablet 0  . meclizine (ANTIVERT) 12.5 MG tablet Take 1 tablet (12.5 mg total) by mouth 3 (three) times daily as needed for dizziness. 30 tablet 3  . methocarbamol (ROBAXIN) 500 MG tablet Take 1 tablet (500 mg total) by mouth 4 (four) times daily. As needed for muscle spasm 30 tablet 1  . methylcellulose (CITRUCEL) oral powder Take daily by mouth.    Marland Kitchen omeprazole (PRILOSEC) 20 MG capsule Take 20 mg daily by mouth.    Marland Kitchen PRESCRIPTION MEDICATION Inhale into the lungs at bedtime. CPAP    . pyridostigmine (MESTINON) 60 MG tablet Take 1 tablet (60 mg total) by mouth 3 (three) times daily. 270 tablet 3  . trimethoprim (TRIMPEX) 100 MG tablet Take 100 mg by mouth daily.  11  . umeclidinium-vilanterol (ANORO ELLIPTA) 62.5-25 MCG/INH AEPB Inhale 1 puff into the lungs daily. 60 each 5   No current facility-administered medications on file prior to visit.     Allergies:  Allergies  Allergen Reactions    . Cortisone Shortness Of Breath, Swelling and Other (See Comments)    Tongue swelling   . Amlodipine Besylate Cough  . Chlorhexidine Gluconate Itching    Skin on face turned red   . Clindamycin Itching  . Dexamethasone Other (See Comments)    Per MD  . Duloxetine Other (See Comments)    Unknown reaction  . Morphine Sulfate Other (See Comments)    Unknown reaction  . Niacin Other (See Comments)  . Other Other (See Comments)    Pollen - nasal reaction  . Prednisone Other (See Comments)    Causes stomach pain. Sores in her mouth.   . Rosuvastatin Calcium Other (See Comments)    Muscle aches  . Statins Other (See Comments)    Muscle aches  . Sulfa Antibiotics Other (See Comments)  May possibly have caused deafness in one ear  . Valsartan Other (See Comments)    Unknown reaction  . Strawberry Extract Itching and Rash    Review of Systems:  CONSTITUTIONAL: No fevers, chills, night sweats, or weight loss.  EYES: No visual changes or eye pain ENT: No hearing changes.  No history of nose bleeds.   RESPIRATORY: No cough, wheezing and shortness of breath.   CARDIOVASCULAR: Negative for chest pain, and palpitations.   GI: Negative for abdominal discomfort, blood in stools or black stools.  No recent change in bowel habits.   GU:  No history of incontinence.   MUSCLOSKELETAL: No history of joint pain or swelling.  No myalgias.   SKIN: Negative for lesions, rash, and itching.   ENDOCRINE: Negative for cold or heat intolerance, polydipsia or goiter.   PSYCH:  + depression or anxiety symptoms.   NEURO: As Above.   Vital Signs:  BP 140/84   Pulse 86   Ht 5' 5"  (1.651 m)   Wt 156 lb (70.8 kg)   SpO2 92%   BMI 25.96 kg/m   General: Well-appearing, comfortable.   Neurological Exam: MENTAL STATUS including orientation to time, place, person, recent and remote memory, attention span and concentration, language, and fund of knowledge is normal.  Speech is normal, no  dysarthria  CRANIAL NERVES:   Pupils equal round and reactive to light.  There is no ophthalmoplegia.  Ptosis is subtle on the left, but does not worsen with sustained upgaze.  Face is symmetric. Orbicularis oculi and oris muscles are 5/5. Buccinator muscles are 4/5 (worse) Tongue is midline and strength is 5/5.     MOTOR: Motor strength throughout is 4+/5 with fatigability.  Neck flexion is 4/5 (worse).  REFLEXES:  Brisk 3+/4 throughout.  Normal tone.   SENSATION:  Intact to vibration throughout  COORDINATION/GAIT:   Gait is slow, unassisted.  She is unable to stand to rise from the chair without using arms.   Data: Labs 08/26/2015:  AChR binding (0.63) blocking (28) antibody, ESR 3, CRP 4.9  MRI/A head 09/09/2015: This MR angiogram of the intracerebral arteries shows the following: 1. Very minimal atherosclerotic change within the left posterior cerebral artery that is unlikely to be clinically significant. 2. There is a normal variant with the left posterior cerebral artery obtaining its flow from the anterior circulation. 3. No aneurysms are seen. 4. No new findings compared to the 11/25/2003 MRA.   MRI orbit wwo contrast 09/09/2015:  This is is a normal MRI of the orbits with and without contrast.  CT chest w contrast 12/08/2016:  No evidence of thymoma  IMPRESSION/PLAN: Complex case.  Ms. Everding is a 82 year old female with refractory seropositive generalized myasthenia gravis (diagnosed June 2017 with ocular MG, transitioned to generalized MG in November 2018) and continues to have generalized weakness, despite induction dose of IVIG x 2 and maintenance dose of IVIG.  She initially responded to IVIG while hospitalized, but now seems to have regressed with limb and neck weakness.  There is no significant bulbar weakness.    Our options for acute management are very limited as she has been intolerant to prednisone in the past exacerbation and is not responding to IVIG.   We discussed plasmapheresis as an in-patient and understandably, she is not keen on being hospitalized again.  Alternatively, I can start her on low dose prednisone and see if she can tolerate it and slowly titrate.  She was agreeable to starting  prednisone 24m daily.  Further, I will increase the dose of her IVIG from 4030mkg to 1g/kg every 3 weeks.  She is now on a steroid-sparing agent, azathioprine, but this will take up to 6 months to see therapeutic benefit, so we cannot rely on this in the acute phase. Being on azathioprine, she will need ongoing laboratory surveillance with CBC and CMP next week, then then every week x 3 weeks, followed by monthly x 3 months, then every 3 months.  Continue mestinon 6033mID  We discussed adding Solaris to her medication regimen which is the newest medication for myathenia gravis.  I will start PA for this medication and in preparation for this, she will need meningococcus vaccination as the medication works on complement cascade and places individuals at risk for encapsulated bacterial infection.  I will request Dr. AvvDanna Heftysistance in administration of meningococcus vaccine.  She will be able to start Solaris 2 weeks following her first immunization.   I will continue to follow her closely and see her again in 3 weeks.   Greater than 50% of this 40 minute visit was spent in counseling, explanation of diagnosis, planning of further management, and coordination of care due to the nature of her refractory condition.    Thank you for allowing me to participate in patient's care.  If I can answer any additional questions, I would be pleased to do so.    Sincerely,    Akash Winski K. PatPosey ProntoO

## 2017-04-18 NOTE — Patient Instructions (Addendum)
Start prednisone 10mg  daily  We will increase the dose of your IVIG  I will contact Dr. Danna Hefty office about setting you up to get meningococcus vaccination and start prior authorization for Solaris  Continue azathioprine 50mg  for another week, then increase to 100mg  daily  Check labs next week, then weekly x 3 weeks  Return to clinic on March 14th at 3pm.

## 2017-04-19 ENCOUNTER — Other Ambulatory Visit: Payer: Self-pay | Admitting: *Deleted

## 2017-04-25 DIAGNOSIS — M1711 Unilateral primary osteoarthritis, right knee: Secondary | ICD-10-CM | POA: Diagnosis not present

## 2017-04-25 DIAGNOSIS — M1712 Unilateral primary osteoarthritis, left knee: Secondary | ICD-10-CM | POA: Diagnosis not present

## 2017-04-25 DIAGNOSIS — M170 Bilateral primary osteoarthritis of knee: Secondary | ICD-10-CM | POA: Diagnosis not present

## 2017-04-30 DIAGNOSIS — N3010 Interstitial cystitis (chronic) without hematuria: Secondary | ICD-10-CM | POA: Diagnosis not present

## 2017-05-06 DIAGNOSIS — F341 Dysthymic disorder: Secondary | ICD-10-CM | POA: Diagnosis not present

## 2017-05-09 ENCOUNTER — Other Ambulatory Visit (INDEPENDENT_AMBULATORY_CARE_PROVIDER_SITE_OTHER): Payer: Medicare Other

## 2017-05-09 ENCOUNTER — Encounter: Payer: Self-pay | Admitting: Neurology

## 2017-05-09 ENCOUNTER — Ambulatory Visit (INDEPENDENT_AMBULATORY_CARE_PROVIDER_SITE_OTHER): Payer: Medicare Other | Admitting: Neurology

## 2017-05-09 VITALS — BP 130/70 | HR 86 | Ht 65.0 in | Wt 157.2 lb

## 2017-05-09 DIAGNOSIS — Z79899 Other long term (current) drug therapy: Secondary | ICD-10-CM | POA: Diagnosis not present

## 2017-05-09 DIAGNOSIS — N952 Postmenopausal atrophic vaginitis: Secondary | ICD-10-CM | POA: Diagnosis not present

## 2017-05-09 DIAGNOSIS — G7001 Myasthenia gravis with (acute) exacerbation: Secondary | ICD-10-CM | POA: Diagnosis not present

## 2017-05-09 DIAGNOSIS — N816 Rectocele: Secondary | ICD-10-CM | POA: Diagnosis not present

## 2017-05-09 DIAGNOSIS — N3010 Interstitial cystitis (chronic) without hematuria: Secondary | ICD-10-CM | POA: Diagnosis not present

## 2017-05-09 DIAGNOSIS — G7000 Myasthenia gravis without (acute) exacerbation: Secondary | ICD-10-CM | POA: Diagnosis not present

## 2017-05-09 LAB — CBC WITH DIFFERENTIAL/PLATELET
BASOS ABS: 0.1 10*3/uL (ref 0.0–0.1)
Basophils Relative: 1 % (ref 0.0–3.0)
Eosinophils Absolute: 0.1 10*3/uL (ref 0.0–0.7)
Eosinophils Relative: 1.9 % (ref 0.0–5.0)
HCT: 36.5 % (ref 36.0–46.0)
Hemoglobin: 12.8 g/dL (ref 12.0–15.0)
LYMPHS ABS: 1 10*3/uL (ref 0.7–4.0)
Lymphocytes Relative: 19.6 % (ref 12.0–46.0)
MCHC: 35 g/dL (ref 30.0–36.0)
MCV: 95.2 fl (ref 78.0–100.0)
Monocytes Absolute: 0.6 10*3/uL (ref 0.1–1.0)
Monocytes Relative: 11.3 % (ref 3.0–12.0)
NEUTROS ABS: 3.3 10*3/uL (ref 1.4–7.7)
NEUTROS PCT: 66.2 % (ref 43.0–77.0)
PLATELETS: 308 10*3/uL (ref 150.0–400.0)
RBC: 3.83 Mil/uL — AB (ref 3.87–5.11)
RDW: 14.2 % (ref 11.5–15.5)
WBC: 5 10*3/uL (ref 4.0–10.5)

## 2017-05-09 LAB — COMPREHENSIVE METABOLIC PANEL
ALK PHOS: 54 U/L (ref 39–117)
ALT: 12 U/L (ref 0–35)
AST: 17 U/L (ref 0–37)
Albumin: 3.3 g/dL — ABNORMAL LOW (ref 3.5–5.2)
BILIRUBIN TOTAL: 0.2 mg/dL (ref 0.2–1.2)
BUN: 12 mg/dL (ref 6–23)
CO2: 31 meq/L (ref 19–32)
Calcium: 9.3 mg/dL (ref 8.4–10.5)
Chloride: 102 mEq/L (ref 96–112)
Creatinine, Ser: 1.13 mg/dL (ref 0.40–1.20)
GFR: 49.32 mL/min — ABNORMAL LOW (ref >60.00)
GLUCOSE: 109 mg/dL — AB (ref 70–99)
Potassium: 4.3 mEq/L (ref 3.5–5.1)
SODIUM: 135 meq/L (ref 135–145)
TOTAL PROTEIN: 8.3 g/dL (ref 6.0–8.3)

## 2017-05-09 NOTE — Progress Notes (Signed)
Follow-up Visit   Date: 2017/05/21    Erica Drake MRN: [id number] DOB: [birth year]   Interim History: Erica Drake is a 82 y.o. right-handed Caucasian female with fibromyalgia, GERD, depression, left sensorineural deficit, interstitial cystitis, and type 2 diabetes mellitus returning to the clinic for follow-up of myasthenia gravis.  The patient was accompanied to the clinic by self.  History of present illness: Initial visit 09/29/2015:  In May 2017, she began experiencing diurnal double vision, with images diagonal of each other and mild changes to her speech.  She has seen her ENT, PCP, ophthalmologist, and then saw Dr. Jaynee Eagles in June who ordered MRI/A head and orbit which was normal.  Her acetylcholine antibodies returned positive.  A prescription for mestinon 54m three times daily was ordered, but patient did not start it.  She has many drug intolerances/allergies, including cortisone, so medications were started at a low dose.   In the fall of 2017, I started prednisone 13mand increased mestinon to 6055mID which did not provide any relief. She stopped taking her prednisone because she has a number of other problems including diverticulosis and bronchitis, which she thought would be worsened by prednisone. Despite stopping her prednisone, there has been no change in her double vision.  [facility] and was recommended to have SFEMG, but she did not wish to return for testing.    She was managed on mestinon 23m52mD alone for most of 2018 due to intolerance with prednisone.  Unfortunately, she was hospitalized from 10/12 - 12/12/2016 with MG exacerbation manifesting with head drop, ptosis, and dysphagia.  She was treated with IVIG x 5 days with improvement in dysphasia and weakness.  However, neck heaviness persisted, so she was started on monthly IVIG.  She was also on prednisone 20mg3m discontinued this after developing mouth sores. She  continues to have generalized fatigue. When she gets tired, her head starts to feel heavy.  In early 2019, she started maintenance dose of IVIG and still did not appreciate marked benefit, so the dose was increased to 1g/kg.  Azathioprine was started in February as a steroid-sparing medication.   In late JanuaFeb 05, 2024 husband passed away.   UPDATE 05/09/01-26-2019e is here for follow-up visit with her caregiver, [name] received IVIG earlier this week at a higher dose of 1g/kg and feels slightly stronger. She did not tolerate prednisone 10mg 24mstopped it.  She continues to have difficulty with gait because of severe knee pain secondary to arthritis.  She denies any double vision, neck heaviness, or arm weakness.  She still gets fatigued easily.   Medications:  Current Outpatient Medications on File Prior to Visit  Medication Sig Dispense Refill  . albuterol (PROAIR HFA) 108 (90 Base) MCG/ACT inhaler Inhale 1-2 puffs into the lungs every 4 (four) hours as needed for wheezing or shortness of breath.     . albuMarland Kitchenerol (PROVENTIL) (2.5 MG/3ML) 0.083% nebulizer solution Take 3 mLs (2.5 mg total) by nebulization every 6 (six) hours as needed for wheezing or shortness of breath. 25 vial 5  . azaTHIOprine (IMURAN) 50 MG tablet Take 1 tablet (50 mg total) by mouth daily. Take 50 mg daily x 2 weeks then increase to 100 mg daily. 60 tablet 5  . buPROPion (WELLBUTRIN SR) 200 MG 12 hr tablet Take 200 mg by mouth 2 (two) times daily after a meal.     . clonazePAM (  KLONOPIN) 1 MG tablet Take 1 tablet by mouth See admin instructions. Take 1 tablet (1 mg) three times daily - morning, after supper and before bed  4  . Fluticasone-Umeclidin-Vilant (TRELEGY ELLIPTA) 100-62.5-25 MCG/INH AEPB Inhale 1 puff into the lungs daily. 2 each 0  . HYDROcodone-acetaminophen (NORCO) 5-325 MG tablet Take 1-2 tablets by mouth every 4 (four) hours as needed for moderate pain. 30 tablet 0  . meclizine (ANTIVERT) 12.5 MG tablet Take 1  tablet (12.5 mg total) by mouth 3 (three) times daily as needed for dizziness. 30 tablet 3  . methocarbamol (ROBAXIN) 500 MG tablet Take 1 tablet (500 mg total) by mouth 4 (four) times daily. As needed for muscle spasm 30 tablet 1  . methylcellulose (CITRUCEL) oral powder Take daily by mouth.    Marland Kitchen omeprazole (PRILOSEC) 20 MG capsule Take 20 mg daily by mouth.    Marland Kitchen PRESCRIPTION MEDICATION Inhale into the lungs at bedtime. CPAP    . pyridostigmine (MESTINON) 60 MG tablet Take 1 tablet (60 mg total) by mouth 3 (three) times daily. 270 tablet 3  . trimethoprim (TRIMPEX) 100 MG tablet Take 100 mg by mouth daily.  11  . umeclidinium-vilanterol (ANORO ELLIPTA) 62.5-25 MCG/INH AEPB Inhale 1 puff into the lungs daily. 60 each 5   No current facility-administered medications on file prior to visit.     Allergies:  Allergies  Allergen Reactions  . Cortisone Shortness Of Breath, Swelling and Other (See Comments)    Tongue swelling   . Amlodipine Besylate Cough  . Chlorhexidine Gluconate Itching    Skin on face turned red   . Clindamycin Itching  . Dexamethasone Other (See Comments)    Per MD  . Duloxetine Other (See Comments)    Unknown reaction  . Morphine Sulfate Other (See Comments)    Unknown reaction  . Niacin Other (See Comments)  . Other Other (See Comments)    Pollen - nasal reaction  . Prednisone Other (See Comments)    Causes stomach pain. Sores in her mouth.   . Rosuvastatin Calcium Other (See Comments)    Muscle aches  . Statins Other (See Comments)    Muscle aches  . Sulfa Antibiotics Other (See Comments)    May possibly have caused deafness in one ear  . Valsartan Other (See Comments)    Unknown reaction  . Strawberry Extract Itching and Rash    Review of Systems:  CONSTITUTIONAL: No fevers, chills, night sweats, or weight loss.  EYES: No visual changes or eye pain ENT: No hearing changes.  No history of nose bleeds.   RESPIRATORY: No cough, wheezing and shortness  of breath.   CARDIOVASCULAR: Negative for chest pain, and palpitations.   GI: Negative for abdominal discomfort, blood in stools or black stools.  No recent change in bowel habits.   GU:  No history of incontinence.   MUSCLOSKELETAL: +history of joint pain or swelling.  No myalgias.   SKIN: Negative for lesions, rash, and itching.   ENDOCRINE: Negative for cold or heat intolerance, polydipsia or goiter.   PSYCH:  No depression or anxiety symptoms.   NEURO: As Above.   Vital Signs:  BP 130/70   Pulse 86   Ht 5' 5"  (1.651 m)   Wt 157 lb 4 oz (71.3 kg)   SpO2 94%   BMI 26.17 kg/m   General: Well-appearing, comfortable.   Neurological Exam: MENTAL STATUS including orientation to time, place, person, recent and remote memory, attention span and concentration,  language, and fund of knowledge is normal.  Speech is not dysarthric.  CRANIAL NERVES:  Pupils equal round and reactive to light.  Normal conjugate, extra-ocular eye movements in all directions of gaze.  Trace left ptosis, no worsening with upgaze. Face is symmetric. Buccinator muscles are 5/5, orbicularis oculi and oris is 5/5. Tongue is midline and strength is 5/5  MOTOR:  Motor strength is 5/5 in all extremities including hip flexion and neck flexion (improved).  No atrophy, fasciculations or abnormal movements.  No pronator drift.  Tone is normal.    COORDINATION/GAIT:  Gait is antalgic and wide-based, unassisted. She cannot stand up from chair without pushing off with arms   Data: Labs 08/26/2015:  AChR binding (0.63) blocking (28) antibody, ESR 3, CRP 4.9  MRI/A head 09/09/2015: This MR angiogram of the intracerebral arteries shows the following: 1. Very minimal atherosclerotic change within the left posterior cerebral artery that is unlikely to be clinically significant. 2. There is a normal variant with the left posterior cerebral artery obtaining its flow from the anterior circulation. 3. No aneurysms are  seen. 4. No new findings compared to the 11/25/2003 MRA.   MRI orbit wwo contrast 09/09/2015:  This is is a normal MRI of the orbits with and without contrast.  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  Azathioprine was added in February 2018.   - She was hospitalized for MG crisis in December, treated with IVIG  - She continues to have generalized weakness, and because she did not tolerate prednisone, she is managed with monthly IVIG.   - Clinically, her motor strength appears much improved and has full strength throughout.    - Continue higher dose of IVIG 1g/kg every 3 weeks.  - Continue azathioprine 155m daily.   Check CBC and CMP today and monthly x 3 months, then every 3 months  - Continue mestinon 683mTID  - Information provided to get meningococcal vaccination as we plan to get prior authorization for Solaris, if she has any future exacerbations  Return to clinic in 4 weeks  Greater than 50% of this 25 minute visit was spent in counseling, explanation of diagnosis, planning of further management, and coordination of care.   Thank you for allowing me to participate in patient's care.  If I can answer any additional questions, I would be pleased to do so.    Sincerely,    Elick Aguilera K. PaPosey ProntoDO

## 2017-05-09 NOTE — Patient Instructions (Addendum)
Check labs  Continue azathioprine 100mg  daily  Continue mestinon 60mg  three times daily  Continue IVIG every 3 weeks  Please get your meningococcus vaccine at your local pharmacy or health department.  Call us with any questions  Return to clinic on April 11th 3:30pm

## 2017-05-16 ENCOUNTER — Telehealth: Payer: Self-pay | Admitting: Neurology

## 2017-05-16 DIAGNOSIS — N3010 Interstitial cystitis (chronic) without hematuria: Secondary | ICD-10-CM | POA: Diagnosis not present

## 2017-05-16 MED ORDER — MENINGOCOCCAL VAC B (OMV) IM SUSY: 0.5000 mL | PREFILLED_SYRINGE | Freq: Once | INTRAMUSCULAR | 0 refills | Status: AC

## 2017-05-16 MED ORDER — MENINGOCOCCAL A C Y&W-135 CONJ IM INJ: 0.5000 mL | INJECTION | Freq: Once | INTRAMUSCULAR | 0 refills | Status: AC

## 2017-05-16 NOTE — Telephone Encounter (Signed)
Attempted to contact patient.  No answer and no voicemail. 

## 2017-05-16 NOTE — Telephone Encounter (Signed)
Rx sent to her pharmacy ([facility] on [address] and [address]) for MenACWY (Menactra) and MenB (Bexsero).   Please inform patient that she needs (2) vaccinations each for the different types of meningococcus, which will be repeated in 2 months.  Thanks.

## 2017-05-16 NOTE — Telephone Encounter (Signed)
Please advise 

## 2017-05-16 NOTE — Telephone Encounter (Signed)
Patient needs to have RX for a meningitis shot. She states taht [facility] has the shot but there are three different kinds so they have to have  RX

## 2017-05-17 DIAGNOSIS — M170 Bilateral primary osteoarthritis of knee: Secondary | ICD-10-CM | POA: Diagnosis not present

## 2017-05-21 NOTE — Telephone Encounter (Signed)
Left patient a message requesting for her to call me to let me know if she got her vaccines.  Informed her that the Rx was sent to [facility].

## 2017-05-23 ENCOUNTER — Telehealth: Payer: Self-pay | Admitting: Neurology

## 2017-05-23 DIAGNOSIS — N3010 Interstitial cystitis (chronic) without hematuria: Secondary | ICD-10-CM | POA: Diagnosis not present

## 2017-05-23 NOTE — Telephone Encounter (Signed)
Patient Lmom needing to let you know that she was supposed to have a Meningitis shot. She said it was a long story. Please Call. Thanks

## 2017-05-23 NOTE — Telephone Encounter (Signed)
Patient has been to the pharmacy twice but the pharmacist would not give her the shots.  Informed her that I will call the pharmacy on Monday since I am off tomorrow.

## 2017-05-30 DIAGNOSIS — N3010 Interstitial cystitis (chronic) without hematuria: Secondary | ICD-10-CM | POA: Diagnosis not present

## 2017-06-04 ENCOUNTER — Ambulatory Visit (INDEPENDENT_AMBULATORY_CARE_PROVIDER_SITE_OTHER): Payer: Medicare Other | Admitting: Neurology

## 2017-06-04 ENCOUNTER — Encounter: Payer: Self-pay | Admitting: Neurology

## 2017-06-04 ENCOUNTER — Other Ambulatory Visit (INDEPENDENT_AMBULATORY_CARE_PROVIDER_SITE_OTHER): Payer: Medicare Other

## 2017-06-04 VITALS — BP 120/70 | HR 118 | Ht 65.0 in | Wt 152.2 lb

## 2017-06-04 DIAGNOSIS — Z79899 Other long term (current) drug therapy: Secondary | ICD-10-CM

## 2017-06-04 DIAGNOSIS — G7000 Myasthenia gravis without (acute) exacerbation: Secondary | ICD-10-CM

## 2017-06-04 LAB — COMPREHENSIVE METABOLIC PANEL
ALBUMIN: 3.9 g/dL (ref 3.5–5.2)
ALK PHOS: 53 U/L (ref 39–117)
ALT: 11 U/L (ref 0–35)
AST: 23 U/L (ref 0–37)
BUN: 19 mg/dL (ref 6–23)
CALCIUM: 9.4 mg/dL (ref 8.4–10.5)
CHLORIDE: 104 meq/L (ref 96–112)
CO2: 29 mEq/L (ref 19–32)
CREATININE: 1.27 mg/dL — AB (ref 0.40–1.20)
GFR: 43.1 mL/min — ABNORMAL LOW (ref >60.00)
Glucose, Bld: 100 mg/dL — ABNORMAL HIGH (ref 70–99)
POTASSIUM: 4.9 meq/L (ref 3.5–5.1)
Sodium: 136 mEq/L (ref 135–145)
TOTAL PROTEIN: 8.8 g/dL — AB (ref 6.0–8.3)
Total Bilirubin: 0.5 mg/dL (ref 0.2–1.2)

## 2017-06-04 LAB — CBC WITH DIFFERENTIAL/PLATELET
BASOS PCT: 1 % (ref 0.0–3.0)
Basophils Absolute: 0.1 10*3/uL (ref 0.0–0.1)
EOS PCT: 2 % (ref 0.0–5.0)
Eosinophils Absolute: 0.1 10*3/uL (ref 0.0–0.7)
HEMATOCRIT: 35.9 % — AB (ref 36.0–46.0)
HEMOGLOBIN: 12.6 g/dL (ref 12.0–15.0)
LYMPHS PCT: 24 % (ref 12.0–46.0)
Lymphs Abs: 1.6 10*3/uL (ref 0.7–4.0)
MCHC: 35.1 g/dL (ref 30.0–36.0)
MCV: 93.1 fl (ref 78.0–100.0)
MONOS PCT: 8 % (ref 3.0–12.0)
Monocytes Absolute: 0.5 10*3/uL (ref 0.1–1.0)
Neutro Abs: 4.2 10*3/uL (ref 1.4–7.7)
Neutrophils Relative %: 65 % (ref 43.0–77.0)
Platelets: 471 10*3/uL — ABNORMAL HIGH (ref 150.0–400.0)
RBC: 3.86 Mil/uL — ABNORMAL LOW (ref 3.87–5.11)
RDW: 13.6 % (ref 11.5–15.5)
WBC: 6.5 10*3/uL (ref 4.0–10.5)

## 2017-06-04 NOTE — Patient Instructions (Addendum)
Recommend contacting Health Department to get meningococcal vaccinations.  You will need (2) immunizations that cover both types:  MenACWY (Menactra) and MenB (Bexsero), which will be repeated in 2 months.  Continue IVIG every 3 weeks  Check labs  Return to clinic on May 21st 3pm

## 2017-06-04 NOTE — Progress Notes (Signed)
Follow-up Visit   Date: 06/04/17    Erica Drake MRN: [id number] DOB: 07/25/1935   Interim History: Erica Drake is a 82 y.o. right-handed Caucasian female with fibromyalgia, GERD, depression, left sensorineural deficit, interstitial cystitis, and type 2 diabetes mellitus returning to the clinic for follow-up of myasthenia gravis.  The patient was accompanied to the clinic by self.  History of present illness: Initial visit 09/29/2015:  In May 2017, she began experiencing diurnal double vision, with images diagonal of each other and mild changes to her speech.  She has seen her ENT, PCP, ophthalmologist, and then saw Dr. Jaynee Eagles in June who ordered MRI/A head and orbit which was normal.  Her acetylcholine antibodies returned positive.  A prescription for mestinon 24m three times daily was ordered, but patient did not start it.  She has many drug intolerances/allergies, including cortisone, so medications were started at a low dose.   In the fall of 2017, I started prednisone 178mand increased mestinon to 6070mID which did not provide any relief. She stopped taking her prednisone because she has a number of other problems including diverticulosis and bronchitis, which she thought would be worsened by prednisone. Despite stopping her prednisone, there has been no change in her double vision.  She went to [facility] a second opinion and was recommended to have SF[facility], but she did not wish to return for testing.    She was managed on mestinon 76m72mD alone for most of 2018 due to intolerance with prednisone.  Unfortunately, she was hospitalized from 10/12 - 12/12/2016 with MG exacerbation manifesting with head drop, ptosis, and dysphagia.  She was treated with IVIG x 5 days with improvement in dysphasia and weakness.  However, neck heaviness persisted, so she was started on monthly IVIG.  She was also on prednisone 20mg17m discontinued this after developing mouth sores.  When she gets tired, her head starts to feel heavy.  In early 2019, she started maintenance dose of IVIG and still did not appreciate marked benefit, so the dose was increased to 1g/kg.  Azathioprine was started in February as a steroid-sparing medication.   In late JanuaFebruary 19, 2024 husband passed away.   UPDATE 3/14/April 09, 2019e is here for follow-up visit with her caregiver, BeckyJacqlyn Drake received IVIG earlier this week at a higher dose of 1g/kg and feels slightly stronger. She did not tolerate prednisone 10mg 41mstopped it.  She continues to have difficulty with gait because of severe knee pain secondary to arthritis.    UPDATE 06/04/2017:  She is here for follow-up visit by herself.  She has not been able to get her meningococcal vaccination due to her pharmacy not allowing her to pick it up or administer it. Overall, she has been doing well and denies any problems with double vision, difficulty swallowing, talking, neck heaviness, or generalized weakness.  She is hoping to move into [facility] in June. She is tolerating IVIG well, except has a line-related pain with her last infusion.  Medications:  Current Outpatient Medications on File Prior to Visit  Medication Sig Dispense Refill  . albuterol (PROAIR HFA) 108 (90 Base) MCG/ACT inhaler Inhale 1-2 puffs into the lungs every 4 (four) hours as needed for wheezing or shortness of breath.     . albuMarland Kitchenerol (PROVENTIL) (2.5 MG/3ML) 0.083% nebulizer solution Take 3 mLs (2.5 mg total) by nebulization every 6 (six) hours as needed for wheezing or shortness of breath. 25 vial 5  .  azaTHIOprine (IMURAN) 50 MG tablet Take 1 tablet (50 mg total) by mouth daily. Take 50 mg daily x 2 weeks then increase to 100 mg daily. 60 tablet 5  . buPROPion (WELLBUTRIN SR) 200 MG 12 hr tablet Take 200 mg by mouth 2 (two) times daily after a meal.     . clonazePAM (KLONOPIN) 1 MG tablet Take 1 tablet by mouth See admin instructions. Take 1 tablet (1 mg) three times daily - morning,  after supper and before bed  4  . Fluticasone-Umeclidin-Vilant (TRELEGY ELLIPTA) 100-62.5-25 MCG/INH AEPB Inhale 1 puff into the lungs daily. 2 each 0  . GAMUNEX-C 5 GM/50ML SOLN   16  . HYDROcodone-acetaminophen (NORCO) 5-325 MG tablet Take 1-2 tablets by mouth every 4 (four) hours as needed for moderate pain. 30 tablet 0  . meclizine (ANTIVERT) 12.5 MG tablet Take 1 tablet (12.5 mg total) by mouth 3 (three) times daily as needed for dizziness. 30 tablet 3  . methocarbamol (ROBAXIN) 500 MG tablet Take 1 tablet (500 mg total) by mouth 4 (four) times daily. As needed for muscle spasm 30 tablet 1  . methylcellulose (CITRUCEL) oral powder Take daily by mouth.    Marland Kitchen omeprazole (PRILOSEC) 20 MG capsule Take 20 mg daily by mouth.    Marland Kitchen PRESCRIPTION MEDICATION Inhale into the lungs at bedtime. CPAP    . pyridostigmine (MESTINON) 60 MG tablet Take 1 tablet (60 mg total) by mouth 3 (three) times daily. 270 tablet 3  . trimethoprim (TRIMPEX) 100 MG tablet Take 100 mg by mouth daily.  11  . umeclidinium-vilanterol (ANORO ELLIPTA) 62.5-25 MCG/INH AEPB Inhale 1 puff into the lungs daily. 60 each 5   No current facility-administered medications on file prior to visit.     Allergies:  Allergies  Allergen Reactions  . Cortisone Shortness Of Breath, Swelling and Other (See Comments)    Tongue swelling   . Amlodipine Besylate Cough  . Chlorhexidine Gluconate Itching    Skin on face turned red   . Clindamycin Itching  . Dexamethasone Other (See Comments)    Per MD  . Duloxetine Other (See Comments)    Unknown reaction  . Morphine Sulfate Other (See Comments)    Unknown reaction  . Niacin Other (See Comments)  . Other Other (See Comments)    Pollen - nasal reaction  . Prednisone Other (See Comments)    Causes stomach pain. Sores in her mouth.   . Rosuvastatin Calcium Other (See Comments)    Muscle aches  . Statins Other (See Comments)    Muscle aches  . Sulfa Antibiotics Other (See Comments)     May possibly have caused deafness in one ear  . Valsartan Other (See Comments)    Unknown reaction  . Strawberry Extract Itching and Rash    Review of Systems:  CONSTITUTIONAL: No fevers, chills, night sweats, or weight loss.  EYES: No visual changes or eye pain ENT: No hearing changes.  No history of nose bleeds.   RESPIRATORY: No cough, wheezing and shortness of breath.   CARDIOVASCULAR: Negative for chest pain, and palpitations.   GI: Negative for abdominal discomfort, blood in stools or black stools.  No recent change in bowel habits.   GU:  No history of incontinence.   MUSCLOSKELETAL: +history of joint pain or swelling.  No myalgias.   SKIN: Negative for lesions, rash, and itching.   ENDOCRINE: Negative for cold or heat intolerance, polydipsia or goiter.   PSYCH:  No depression or anxiety  symptoms.   NEURO: As Above.   Vital Signs:  BP 120/70   Pulse (!) 118   Ht 5' 5"  (1.651 m)   Wt 152 lb 4 oz (69.1 kg)   SpO2 93%   BMI 25.34 kg/m   General Medical Exam:   General:  Well appearing, comfortable   Neck: No carotid bruits. Respiratory:  Clear to auscultation, good air entry bilaterally.   Cardiac:  Regular rate and rhythm, no murmur.    Neurological Exam: MENTAL STATUS including orientation to time, place, person, recent and remote memory, attention span and concentration, language, and fund of knowledge is normal.  Speech is not dysarthric.  CRANIAL NERVES:  Pupils equal round and reactive to light.  Normal conjugate, extra-ocular eye movements in all directions of gaze.  No ptosis at rest.  Face is symmetric. Buccinator muscles are 5/5, orbicularis oculi and oris is 5/5. Tongue is midline and strength is 5/5  MOTOR:  Motor strength is 5/5 in all extremities including hip flexion and neck flexion    COORDINATION/GAIT:  Gait is antalgic and wide-based, unassisted. She cannot stand up from chair without pushing off with arms   Data: Labs 08/26/2015:  AChR binding  (0.63) blocking (28) antibody, ESR 3, CRP 4.9  MRI/A head 09/09/2015: This MR angiogram of the intracerebral arteries shows the following: 1. Very minimal atherosclerotic change within the left posterior cerebral artery that is unlikely to be clinically significant. 2. There is a normal variant with the left posterior cerebral artery obtaining its flow from the anterior circulation. 3. No aneurysms are seen. 4. No new findings compared to the 11/25/2003 MRA.   MRI orbit wwo contrast 09/09/2015:  This is is a normal MRI of the orbits with and without contrast.  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 She was hospitalized for MG crisis in December, treated with IVIG, did not tolerate prednisone, she is managed with monthly IVIG Azathioprine was added in February 2019.  Clinically, doing well on maintenance IVIG every 3 weeks, exam is stable without weakness.   - Continue IVIG 1g/kg every 3 weeks.  - Continue azathioprine 151m daily.   Check CBC and CMP today and monthly x 3 months, then every 3 months  - Continue mestinon 627mTID  - Unfortunately, it has been very challenging trying to coordinate her meningococcal vaccination - MenACWY (Menactra) and MenB (Bexsero), 2 part immunization repeated in 2 months.  I would like to keep Solaris available, should she have future exacerbations.  Information provided for health department who should be able to assist with her vaccinations  Return to clinic in 5-6 weeks  Greater than 50% of this 25 minute visit was spent in counseling, explanation of diagnosis, planning of further management, and coordination of care.    Thank you for allowing me to participate in patient's care.  If I can answer any additional questions, I would be pleased to do so.    Sincerely,    Mireya Meditz K. PaPosey Pronto DO

## 2017-06-06 DIAGNOSIS — N3010 Interstitial cystitis (chronic) without hematuria: Secondary | ICD-10-CM | POA: Diagnosis not present

## 2017-06-12 DIAGNOSIS — F341 Dysthymic disorder: Secondary | ICD-10-CM | POA: Diagnosis not present

## 2017-06-13 DIAGNOSIS — N3010 Interstitial cystitis (chronic) without hematuria: Secondary | ICD-10-CM | POA: Diagnosis not present

## 2017-06-17 DIAGNOSIS — E119 Type 2 diabetes mellitus without complications: Secondary | ICD-10-CM | POA: Diagnosis not present

## 2017-06-17 DIAGNOSIS — K582 Mixed irritable bowel syndrome: Secondary | ICD-10-CM | POA: Diagnosis not present

## 2017-06-17 DIAGNOSIS — K5900 Constipation, unspecified: Secondary | ICD-10-CM | POA: Diagnosis not present

## 2017-06-17 DIAGNOSIS — G7000 Myasthenia gravis without (acute) exacerbation: Secondary | ICD-10-CM | POA: Diagnosis not present

## 2017-06-17 DIAGNOSIS — K219 Gastro-esophageal reflux disease without esophagitis: Secondary | ICD-10-CM | POA: Diagnosis not present

## 2017-06-17 DIAGNOSIS — N3011 Interstitial cystitis (chronic) with hematuria: Secondary | ICD-10-CM | POA: Diagnosis not present

## 2017-06-17 DIAGNOSIS — M797 Fibromyalgia: Secondary | ICD-10-CM | POA: Diagnosis not present

## 2017-06-21 DIAGNOSIS — N3010 Interstitial cystitis (chronic) without hematuria: Secondary | ICD-10-CM | POA: Diagnosis not present

## 2017-06-26 DIAGNOSIS — J449 Chronic obstructive pulmonary disease, unspecified: Secondary | ICD-10-CM | POA: Diagnosis not present

## 2017-06-26 DIAGNOSIS — N816 Rectocele: Secondary | ICD-10-CM | POA: Diagnosis not present

## 2017-06-26 DIAGNOSIS — H9190 Unspecified hearing loss, unspecified ear: Secondary | ICD-10-CM | POA: Diagnosis not present

## 2017-06-26 DIAGNOSIS — K5902 Outlet dysfunction constipation: Secondary | ICD-10-CM | POA: Diagnosis not present

## 2017-06-26 DIAGNOSIS — G7000 Myasthenia gravis without (acute) exacerbation: Secondary | ICD-10-CM | POA: Diagnosis not present

## 2017-06-26 DIAGNOSIS — N3010 Interstitial cystitis (chronic) without hematuria: Secondary | ICD-10-CM | POA: Diagnosis not present

## 2017-06-26 HISTORY — PX: BREAST BIOPSY: SHX20

## 2017-07-02 ENCOUNTER — Other Ambulatory Visit: Payer: Self-pay | Admitting: Internal Medicine

## 2017-07-02 ENCOUNTER — Ambulatory Visit
Admission: RE | Admit: 2017-07-02 | Discharge: 2017-07-02 | Disposition: A | Discharge status: Home / Self Care | Payer: Medicare Other | Source: Ambulatory Visit | Attending: Internal Medicine | Admitting: Internal Medicine

## 2017-07-02 DIAGNOSIS — Z1231 Encounter for screening mammogram for malignant neoplasm of breast: Secondary | ICD-10-CM | POA: Diagnosis not present

## 2017-07-02 DIAGNOSIS — Z1239 Encounter for other screening for malignant neoplasm of breast: Secondary | ICD-10-CM

## 2017-07-03 ENCOUNTER — Other Ambulatory Visit: Payer: Self-pay | Admitting: Internal Medicine

## 2017-07-03 DIAGNOSIS — R928 Other abnormal and inconclusive findings on diagnostic imaging of breast: Secondary | ICD-10-CM

## 2017-07-04 DIAGNOSIS — G7000 Myasthenia gravis without (acute) exacerbation: Secondary | ICD-10-CM | POA: Diagnosis not present

## 2017-07-04 DIAGNOSIS — R159 Full incontinence of feces: Secondary | ICD-10-CM | POA: Diagnosis not present

## 2017-07-04 DIAGNOSIS — K219 Gastro-esophageal reflux disease without esophagitis: Secondary | ICD-10-CM | POA: Diagnosis not present

## 2017-07-04 DIAGNOSIS — J449 Chronic obstructive pulmonary disease, unspecified: Secondary | ICD-10-CM | POA: Diagnosis not present

## 2017-07-04 DIAGNOSIS — R151 Fecal smearing: Secondary | ICD-10-CM | POA: Diagnosis not present

## 2017-07-04 DIAGNOSIS — N816 Rectocele: Secondary | ICD-10-CM | POA: Diagnosis not present

## 2017-07-04 DIAGNOSIS — Z9189 Other specified personal risk factors, not elsewhere classified: Secondary | ICD-10-CM | POA: Diagnosis not present

## 2017-07-04 DIAGNOSIS — N281 Cyst of kidney, acquired: Secondary | ICD-10-CM | POA: Diagnosis not present

## 2017-07-04 DIAGNOSIS — Z79899 Other long term (current) drug therapy: Secondary | ICD-10-CM | POA: Diagnosis not present

## 2017-07-04 DIAGNOSIS — K5902 Outlet dysfunction constipation: Secondary | ICD-10-CM | POA: Diagnosis not present

## 2017-07-05 DIAGNOSIS — R338 Other retention of urine: Secondary | ICD-10-CM | POA: Diagnosis not present

## 2017-07-05 DIAGNOSIS — N9989 Other postprocedural complications and disorders of genitourinary system: Secondary | ICD-10-CM | POA: Diagnosis not present

## 2017-07-09 ENCOUNTER — Telehealth: Payer: Self-pay | Admitting: Neurology

## 2017-07-09 NOTE — Telephone Encounter (Signed)
Left message informing patient that Dr. Serita Grit last note says to continue medication and reminded patient to come in this month for her lab work.

## 2017-07-09 NOTE — Telephone Encounter (Signed)
Patient called and needed to know if she should be taking the medication Azathioprine? She said she has 4 refills on it. Please Call. Thanks

## 2017-07-16 ENCOUNTER — Encounter: Payer: Self-pay | Admitting: Neurology

## 2017-07-16 ENCOUNTER — Ambulatory Visit (INDEPENDENT_AMBULATORY_CARE_PROVIDER_SITE_OTHER): Payer: Medicare Other | Admitting: Neurology

## 2017-07-16 VITALS — BP 130/90 | HR 86 | Ht 65.0 in | Wt 154.0 lb

## 2017-07-16 DIAGNOSIS — G7000 Myasthenia gravis without (acute) exacerbation: Secondary | ICD-10-CM

## 2017-07-16 NOTE — Patient Instructions (Addendum)
Continue your medications as you are taking  Return to clinic on July 18th at 3pm.

## 2017-07-16 NOTE — Progress Notes (Signed)
Follow-up Visit   Date: 07/16/17    VALINCIA TOUCH MRN: [id number] DOB: 05/31/1935   Interim History: Erica Drake is a 82 y.o. right-handed Caucasian female with fibromyalgia, GERD, depression, left sensorineural deficit, interstitial cystitis, and type 2 diabetes mellitus returning to the clinic for follow-up of myasthenia gravis.  The patient was accompanied to the clinic by self.  History of present illness: Initial visit 09/29/2015:  In May 2017, she began experiencing diurnal double vision, with images diagonal of each other and mild changes to her speech.  She has seen her ENT, PCP, ophthalmologist, and then saw Dr. Jaynee Eagles in June who ordered MRI/A head and orbit which was normal.  Her acetylcholine antibodies returned positive.  A prescription for mestinon 35m three times daily was ordered, but patient did not start it.  She has many drug intolerances/allergies, including cortisone, so medications were started at a low dose.   In the fall of 2017, I started prednisone 159mand increased mestinon to 6087mID which did not provide any relief. She stopped taking her prednisone because she has a number of other problems including diverticulosis and bronchitis, which she thought would be worsened by prednisone. Despite stopping her prednisone, there has been no change in her double vision.  [facility] and was recommended to have [facility], but she did not wish to return for testing.    She was managed on mestinon 86m19mD alone for most of 2018 due to intolerance with prednisone.  Unfortunately, she was hospitalized from 10/12 - 12/12/2016 with MG exacerbation manifesting with head drop, ptosis, and dysphagia.  She was treated with IVIG x 5 days with improvement in dysphasia and weakness.  However, neck heaviness persisted, so she was started on monthly IVIG.  She was also on prednisone 20mg73m discontinued this after developing mouth sores.  When she gets tired, her head starts to feel heavy.  In early 2019, she started maintenance dose of IVIG and still did not appreciate marked benefit, so the dose was increased to 1g/kg.  Azathioprine was started in February 2019 as a steroid-sparing medication.   In late Janua2024-01-28 husband passed away.  She did not tolerate prednisone 10mg 51mth ulcers), so stopped it.   UPDATE 06/04/2017:  She is here for follow-up visit by herself.  She has not been able to get her meningococcal vaccination due to her pharmacy not allowing her to pick it up or administer it. Overall, she has been doing well and denies any problems with double vision, difficulty swallowing, talking, neck heaviness, or generalized weakness.  She is hoping to move into [facility] in June. She is tolerating IVIG well, except has a line-related pain with her last infusion.  UPDATE 07/16/2017:  She is here for follow-up visit and continues to do well with IVIG every 3 weeks and azathioprine 100mg/d39me has very rare spells of double vision and difficulty swallowing, but overall is feeling much stronger.  No neck heaviness, shortness of breath, or leg weakness.  She is walking unassisted.  She had surgery for rectocele two weeks ago and is recovering from this.  [facility] [place] [facility] [place] [facility]    Medications:  Current Outpatient Medications on File Prior to Visit  Medication Sig Dispense Refill  . albuterol (PROAIR HFA) 108 (90 Base) MCG/ACT inhaler Inhale 1-2 puffs into the lungs every 4 (four) hours as needed for wheezing or shortness of  breath.     Marland Kitchen albuterol (PROVENTIL) (2.5 MG/3ML) 0.083% nebulizer solution Take 3 mLs (2.5 mg total) by nebulization every 6 (six) hours as needed for wheezing or shortness of breath. 25 vial 5  . azaTHIOprine (IMURAN) 50 MG tablet Take 1 tablet (50 mg total) by mouth daily. Take 50 mg daily x 2 weeks then increase to 100 mg daily. 60 tablet 5  . buPROPion (WELLBUTRIN SR) 200 MG 12 hr tablet Take  200 mg by mouth 2 (two) times daily after a meal.     . clonazePAM (KLONOPIN) 1 MG tablet Take 1 tablet by mouth See admin instructions. Take 1 tablet (1 mg) three times daily - morning, after supper and before bed  4  . Fluticasone-Umeclidin-Vilant (TRELEGY ELLIPTA) 100-62.5-25 MCG/INH AEPB Inhale 1 puff into the lungs daily. 2 each 0  . GAMUNEX-C 5 GM/50ML SOLN   16  . GLYDO 2 % jelly APPLY TOPICALLY PRN.  2  . HYDROcodone-acetaminophen (NORCO) 5-325 MG tablet Take 1-2 tablets by mouth every 4 (four) hours as needed for moderate pain. 30 tablet 0  . lidocaine (XYLOCAINE) 2 % jelly Apply topically.    . meclizine (ANTIVERT) 12.5 MG tablet Take 1 tablet (12.5 mg total) by mouth 3 (three) times daily as needed for dizziness. 30 tablet 3  . meloxicam (MOBIC) 15 MG tablet   1  . methocarbamol (ROBAXIN) 500 MG tablet Take 1 tablet (500 mg total) by mouth 4 (four) times daily. As needed for muscle spasm 30 tablet 1  . methylcellulose (CITRUCEL) oral powder Take daily by mouth.    Marland Kitchen omeprazole (PRILOSEC) 20 MG capsule Take 20 mg daily by mouth.    . polyethylene glycol (MIRALAX / GLYCOLAX) packet Take 17 g by mouth.    Marland Kitchen PRESCRIPTION MEDICATION Inhale into the lungs at bedtime. CPAP    . pyridostigmine (MESTINON) 60 MG tablet Take 1 tablet (60 mg total) by mouth 3 (three) times daily. 270 tablet 3  . trimethoprim (TRIMPEX) 100 MG tablet Take 100 mg by mouth daily.  11  . umeclidinium-vilanterol (ANORO ELLIPTA) 62.5-25 MCG/INH AEPB Inhale 1 puff into the lungs daily. 60 each 5   No current facility-administered medications on file prior to visit.     Allergies:  Allergies  Allergen Reactions  . Cortisone Shortness Of Breath, Swelling and Other (See Comments)    Tongue swelling   . Amlodipine Besylate Cough  . Chlorhexidine Gluconate Itching    Skin on face turned red   . Clindamycin Itching  . Dexamethasone Other (See Comments)    Per MD  . Duloxetine Other (See Comments)    Unknown  reaction  . Morphine Sulfate Other (See Comments)    Unknown reaction  . Niacin Other (See Comments)  . Other Other (See Comments)    Pollen - nasal reaction  . Prednisone Other (See Comments)    Causes stomach pain. Sores in her mouth.   . Rosuvastatin Calcium Other (See Comments)    Muscle aches  . Statins Other (See Comments)    Muscle aches  . Sulfa Antibiotics Other (See Comments)    May possibly have caused deafness in one ear  . Valsartan Other (See Comments)    Unknown reaction  . Strawberry Extract Itching and Rash    Review of Systems:  CONSTITUTIONAL: No fevers, chills, night sweats, or weight loss.  EYES: No visual changes or eye pain ENT: No hearing changes.  No history of nose bleeds.   RESPIRATORY:  No cough, wheezing and shortness of breath.   CARDIOVASCULAR: Negative for chest pain, and palpitations.   GI: Negative for abdominal discomfort, blood in stools or black stools.  No recent change in bowel habits.   GU:  No history of incontinence.   MUSCLOSKELETAL: +history of joint pain or swelling.  No myalgias.   SKIN: Negative for lesions, rash, and itching.   ENDOCRINE: Negative for cold or heat intolerance, polydipsia or goiter.   PSYCH:  No depression or anxiety symptoms.   NEURO: As Above.   Vital Signs:  BP 130/90   Pulse 86   Ht 5' 5"  (1.651 m)   Wt 154 lb (69.9 kg)   SpO2 93%   BMI 25.63 kg/m   General Medical Exam:   General:  Well appearing, comfortable   Neck: No carotid bruits. Respiratory:  Clear to auscultation, good air entry bilaterally.   Cardiac:  Regular rate and rhythm, no murmur.    Neurological Exam: MENTAL STATUS including orientation to time, place, person, recent and remote memory, attention span and concentration, language, and fund of knowledge is normal.  Speech is not dysarthric, sounds strong today.  CRANIAL NERVES:  Pupils equal round and reactive to light.  Normal conjugate, extra-ocular eye movements in all directions  of gaze.  No ptosis at rest.  Face is symmetric. Buccinator muscles are 5/5, orbicularis oculi and oris is 5/5. Tongue is midline and strength is 5/5  MOTOR:  Motor strength is 5/5 in all extremities including hip flexion and neck flexion    COORDINATION/GAIT:  Gait is mildly antalgic due to recent surgery, unassisted and stable.   Data: Labs 08/26/2015:  AChR binding (0.63) blocking (28) antibody, ESR 3, CRP 4.9  MRI/A head 09/09/2015: This MR angiogram of the intracerebral arteries shows the following: 1. Very minimal atherosclerotic change within the left posterior cerebral artery that is unlikely to be clinically significant. 2. There is a normal variant with the left posterior cerebral artery obtaining its flow from the anterior circulation. 3. No aneurysms are seen. 4. No new findings compared to the 11/25/2003 MRA.   MRI orbit wwo contrast 09/09/2015:  This is is a normal MRI of the orbits with and without contrast.  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 She was hospitalized for MG crisis in December 2018, treated with IVIG, did not tolerate prednisone, she is managed with monthly IVIG Azathioprine was added in February 2019. Clinically, doing great on maintenance IVIG every 3 weeks, exam is stable without weakness.   - Continue IVIG 1g/kg every 3 weeks.  - Continue azathioprine 125m daily.   Check CBC and CMP in 2 months, then every 3 months  - Continue mestinon 635mTID  - Hold on Solaris as she is doing much better.   Return to clinic in 2 months  Greater than 50% of this 20 minute visit was spent in counseling, explanation of diagnosis, planning of further management, and coordination of care.   Thank you for allowing me to participate in patient's care.  If I can answer any additional questions, I would be  pleased to do so.    Sincerely,    Miniya Miguez K. PaPosey ProntoDO

## 2017-07-17 ENCOUNTER — Ambulatory Visit
Admission: RE | Admit: 2017-07-17 | Discharge: 2017-07-17 | Disposition: A | Discharge status: Home / Self Care | Payer: Medicare Other | Source: Ambulatory Visit | Attending: Internal Medicine | Admitting: Internal Medicine

## 2017-07-17 ENCOUNTER — Other Ambulatory Visit: Payer: Self-pay | Admitting: Internal Medicine

## 2017-07-17 DIAGNOSIS — R928 Other abnormal and inconclusive findings on diagnostic imaging of breast: Secondary | ICD-10-CM

## 2017-07-17 DIAGNOSIS — N6489 Other specified disorders of breast: Secondary | ICD-10-CM

## 2017-07-17 DIAGNOSIS — R921 Mammographic calcification found on diagnostic imaging of breast: Secondary | ICD-10-CM | POA: Diagnosis not present

## 2017-07-18 ENCOUNTER — Other Ambulatory Visit: Payer: Medicare Other

## 2017-07-19 ENCOUNTER — Ambulatory Visit
Admission: RE | Admit: 2017-07-19 | Discharge: 2017-07-19 | Disposition: A | Discharge status: Home / Self Care | Payer: Medicare Other | Source: Ambulatory Visit | Attending: Internal Medicine | Admitting: Internal Medicine

## 2017-07-19 ENCOUNTER — Other Ambulatory Visit: Payer: Self-pay | Admitting: Internal Medicine

## 2017-07-19 DIAGNOSIS — N6489 Other specified disorders of breast: Secondary | ICD-10-CM | POA: Diagnosis not present

## 2017-07-19 DIAGNOSIS — R921 Mammographic calcification found on diagnostic imaging of breast: Secondary | ICD-10-CM

## 2017-07-19 DIAGNOSIS — D0511 Intraductal carcinoma in situ of right breast: Secondary | ICD-10-CM | POA: Diagnosis not present

## 2017-07-24 ENCOUNTER — Encounter: Payer: Self-pay | Admitting: *Deleted

## 2017-07-24 ENCOUNTER — Telehealth: Payer: Self-pay | Admitting: Hematology and Oncology

## 2017-07-24 DIAGNOSIS — D0511 Intraductal carcinoma in situ of right breast: Secondary | ICD-10-CM | POA: Insufficient documentation

## 2017-07-24 NOTE — Telephone Encounter (Signed)
Spoke to patient to confirm afternoon [facility] appointment on 6/5, packet will be mailed to patient

## 2017-07-26 DIAGNOSIS — M1711 Unilateral primary osteoarthritis, right knee: Secondary | ICD-10-CM | POA: Diagnosis not present

## 2017-07-26 DIAGNOSIS — M1712 Unilateral primary osteoarthritis, left knee: Secondary | ICD-10-CM | POA: Diagnosis not present

## 2017-07-31 ENCOUNTER — Ambulatory Visit: Payer: Self-pay | Admitting: Surgery

## 2017-07-31 ENCOUNTER — Encounter: Payer: Self-pay | Admitting: Hematology and Oncology

## 2017-07-31 ENCOUNTER — Encounter: Payer: Self-pay | Admitting: Radiation Oncology

## 2017-07-31 ENCOUNTER — Encounter: Payer: Self-pay | Admitting: *Deleted

## 2017-07-31 ENCOUNTER — Inpatient Hospital Stay: Payer: Medicare Other

## 2017-07-31 ENCOUNTER — Ambulatory Visit: Payer: Medicare Other | Admitting: Physical Therapy

## 2017-07-31 ENCOUNTER — Inpatient Hospital Stay: Payer: Medicare Other | Attending: Hematology and Oncology | Admitting: Hematology and Oncology

## 2017-07-31 ENCOUNTER — Ambulatory Visit
Admission: RE | Admit: 2017-07-31 | Discharge: 2017-07-31 | Disposition: A | Discharge status: Home / Self Care | Payer: Medicare Other | Source: Ambulatory Visit | Attending: Radiation Oncology | Admitting: Radiation Oncology

## 2017-07-31 DIAGNOSIS — G7000 Myasthenia gravis without (acute) exacerbation: Secondary | ICD-10-CM | POA: Diagnosis not present

## 2017-07-31 DIAGNOSIS — Z87891 Personal history of nicotine dependence: Secondary | ICD-10-CM | POA: Insufficient documentation

## 2017-07-31 DIAGNOSIS — D0511 Intraductal carcinoma in situ of right breast: Secondary | ICD-10-CM | POA: Insufficient documentation

## 2017-07-31 DIAGNOSIS — Z803 Family history of malignant neoplasm of breast: Secondary | ICD-10-CM | POA: Diagnosis not present

## 2017-07-31 DIAGNOSIS — Z79899 Other long term (current) drug therapy: Secondary | ICD-10-CM | POA: Diagnosis not present

## 2017-07-31 DIAGNOSIS — R5382 Chronic fatigue, unspecified: Secondary | ICD-10-CM | POA: Diagnosis not present

## 2017-07-31 DIAGNOSIS — Z170 Estrogen receptor positive status [ER+]: Secondary | ICD-10-CM | POA: Diagnosis not present

## 2017-07-31 LAB — CMP (CANCER CENTER ONLY)
ALBUMIN: 3.3 g/dL — AB (ref 3.5–5.0)
ALT: 9 U/L (ref 0–55)
AST: 24 U/L (ref 5–34)
Alkaline Phosphatase: 60 U/L (ref 40–150)
Anion gap: 6 (ref 3–11)
BUN: 15 mg/dL (ref 7–26)
CHLORIDE: 102 mmol/L (ref 98–109)
CO2: 27 mmol/L (ref 22–29)
Calcium: 9.2 mg/dL (ref 8.4–10.4)
Creatinine: 1.37 mg/dL — ABNORMAL HIGH (ref 0.60–1.10)
GFR, Est AFR Am: 41 mL/min — ABNORMAL LOW (ref >60)
GFR, Estimated: 36 mL/min — ABNORMAL LOW (ref >60)
GLUCOSE: 86 mg/dL (ref 70–140)
POTASSIUM: 5.3 mmol/L — AB (ref 3.5–5.1)
Sodium: 135 mmol/L — ABNORMAL LOW (ref 136–145)
Total Bilirubin: <0.2 mg/dL — ABNORMAL LOW (ref 0.2–1.2)
Total Protein: 9.4 g/dL — ABNORMAL HIGH (ref 6.4–8.3)

## 2017-07-31 LAB — CBC WITH DIFFERENTIAL (CANCER CENTER ONLY)
Basophils Absolute: 0 K/uL (ref 0.0–0.1)
Basophils Relative: 1 %
Eosinophils Absolute: 0.4 K/uL (ref 0.0–0.5)
Eosinophils Relative: 8 %
HCT: 36.9 % (ref 34.8–46.6)
Hemoglobin: 12.6 g/dL (ref 11.6–15.9)
Lymphocytes Relative: 19 %
Lymphs Abs: 0.8 K/uL — ABNORMAL LOW (ref 0.9–3.3)
MCH: 33.2 pg (ref 25.1–34.0)
MCHC: 34.1 g/dL (ref 31.5–36.0)
MCV: 97.3 fL (ref 79.5–101.0)
Monocytes Absolute: 0.5 K/uL (ref 0.1–0.9)
Monocytes Relative: 11 %
Neutro Abs: 2.7 K/uL (ref 1.5–6.5)
Neutrophils Relative %: 61 %
Platelet Count: 355 K/uL (ref 145–400)
RBC: 3.79 MIL/uL (ref 3.70–5.45)
RDW: 13.7 % (ref 11.2–14.5)
WBC Count: 4.4 K/uL (ref 3.9–10.3)

## 2017-07-31 NOTE — H&P (Signed)
Erica Drake Documented: 07/31/2017 7:32 AM Location: [facility] Patient #: [id number] DOB: 02-Feb-1936 Married / Language: Cleophus Molt / Race: White Female  History of Present Illness Marcello Moores A. Otila Starn MD; 07/31/2017 3:02 PM) Patient words: 82 YO female sent at the request of DR Carlsborg AN AREA OF MICROCALCIFICATIONS and distortion, Pt denies pain, mass or nipple discharge. The area involves the upper inner and and upper outer quadrants and measures 4 cm combined. Path from core bx shows DCIS intermediate grade ER/PR positive. No other complaints.  The patient is a 82 year old female.   Medication History Tawni Pummel, RN; 07/31/2017 7:33 AM) Medications Reconciled     Review of Systems (Jaala Bohle A. Jaxie Racanelli MD; 07/31/2017 2:59 PM) All other systems negative   Physical Exam (Mick Tanguma A. Dekisha Mesmer MD; 07/31/2017 3:03 PM)  General Mental Status-Alert. General Appearance-Consistent with stated age. Hydration-Well hydrated. Voice-Normal.  Head and Neck Head-normocephalic, atraumatic with no lesions or palpable masses. Trachea-midline. Thyroid Gland Characteristics - normal size and consistency.  Eye Eyeball - Bilateral-Extraocular movements intact. Sclera/Conjunctiva - Bilateral-No scleral icterus.  Chest and Lung Exam Chest and lung exam reveals -quiet, even and easy respiratory effort with no use of accessory muscles and on auscultation, normal breath sounds, no adventitious sounds and normal vocal resonance. Inspection Chest Wall - Normal. Back - normal.  Breast Note: hematoma upper right breast no other masses bilaterally no nipple discharge  Cardiovascular Cardiovascular examination reveals -normal heart sounds, regular rate and rhythm with no murmurs and normal pedal pulses bilaterally.  Musculoskeletal Normal Exam - Left-Upper Extremity Strength Normal and Lower Extremity Strength  Normal. Normal Exam - Right-Upper Extremity Strength Normal and Lower Extremity Strength Normal.  Lymphatic Head & Neck  General Head & Neck Lymphatics: Bilateral - Description - Normal. Axillary  General Axillary Region: Bilateral - Description - Normal. Tenderness - Non Tender.    Assessment & Plan (Tupac Jeffus A. Isaiahs Chancy MD; 07/31/2017 3:05 PM)  BREAST NEOPLASM, TIS (DCIS), RIGHT (D05.11) Impression: DISCUSSED LUMPECTOMY AND MASTECTOMY PT THINKING ABOUT comet TRIAL AS WELL. S Risk of lumpectomy include bleeding, infection, seroma, more surgery, use of seed/wire, wound care, cosmetic deformity and the need for other treatments, death , blood clots, death. Pt agrees to proceed. Discussed treatment options for breast cancer to include breast conservation vs mastectomy with reconstruction. Pt has decided on mastectomy. Risk include bleeding, infection, flap necrosis, pain, numbness, recurrence, hematoma, other surgery needs. Pt understands and agrees to proceed. HE WILL THINK ABOUT IT AND LET us KNOW.  Current Plans You are being scheduled for surgery- Our schedulers will call you.  You should hear from our office's scheduling department within 5 working days about the location, date, and time of surgery. We try to make accommodations for patient's preferences in scheduling surgery, but sometimes the OR schedule or the surgeon's schedule prevents [place] from making those accommodations.  If you have not heard from our office [phone]) in 5 working days, call the office and ask for your surgeon's nurse.  If you have other questions about your diagnosis, plan, or surgery, call the office and ask for your surgeon's nurse.  Pt Education - [facility] Breast Cancer Information Given - [facility] "Breast Journey" Package Pt Education - Pamphlet Given - Breast Biopsy: discussed with patient and provided information. We discussed the staging and pathophysiology of breast cancer. We discussed all of the  different options for treatment for breast cancer including surgery, chemotherapy, radiation therapy, Herceptin, and antiestrogen therapy.  We discussed a sentinel lymph node biopsy as she does not appear to having lymph node involvement right now. We discussed the performance of that with injection of radioactive tracer and blue dye. We discussed that she would have an incision underneath her axillary hairline. We discussed that there is a bout a 10-20% chance of having a positive node with a sentinel lymph node biopsy and we will await the permanent pathology to make any other first further decisions in terms of her treatment. One of these options might be to return to the operating room to perform an axillary lymph node dissection. We discussed about a 1-2% risk lifetime of chronic shoulder pain as well as lymphedema associated with a sentinel lymph node biopsy. We discussed the options for treatment of the breast cancer which included lumpectomy versus a mastectomy. We discussed the performance of the lumpectomy with a wire placement. We discussed a 10-20% chance of a positive margin requiring reexcision in the operating room. We also discussed that she may need radiation therapy or antiestrogen therapy or both if she undergoes lumpectomy. We discussed the mastectomy and the postoperative care for that as well. We discussed that there is no difference in her survival whether she undergoes lumpectomy with radiation therapy or antiestrogen therapy versus a mastectomy. There is a slight difference in the local recurrence rate being 3-5% with lumpectomy and about 1% with a mastectomy. We discussed the risks of operation including bleeding, infection, possible reoperation. She understands her further therapy will be based on what her stages at the time of her operation.  Pt Education - [facility]b breast cancer surgery: discussed with patient and provided information. Pt Education - [facility] Mastectomy HCI Pt Education -  [facility] Breast Biopsy HCI: discussed with patient and provided information.

## 2017-07-31 NOTE — Progress Notes (Signed)
[facility] NOTE  Patient Care Team: Prince Solian, MD as PCP - General (Internal Medicine) Erroll Luna, MD as Consulting Physician (General Surgery) Nicholas Lose, MD as Consulting Physician (Hematology and Oncology) Eppie Gibson, MD as Attending Physician (Radiation Oncology)  CHIEF COMPLAINTS/PURPOSE OF CONSULTATION:  Newly diagnosed right breast DCIS  HISTORY OF PRESENTING ILLNESS:  Erica Drake 82 y.o. female is here because of recent diagnosis of right breast DCIS.  Patient had a routine screening mammogram that detected distortion and calcifications.  Biopsy of both of these came back as high intermediate grade DCIS.  She was presented this morning and she is here today to discuss her treatment plan.  She has multiple other medical problems including myasthenia gravis and fibromyalgia.  She also has chronic fatigue.  Her daughter is on the telephone and she is accompanied by her friend.  She was presented this morning to the multidisciplinary tumor board and she is here today at the [facility] clinic to discuss her treatment plan.  She has been using estrogen cream for vaginal dryness.  I reviewed her records extensively and collaborated the history with the patient.  SUMMARY OF ONCOLOGIC HISTORY:   Ductal carcinoma in situ (DCIS) of right breast   07/19/2017 Initial Diagnosis    Screening detected right breast calcifications and distortion.  The distortion was in the upper outer quadrant: Biopsy DCIS intermediate grade with CSL; calcifications UIQ: 2.8 cm: Biopsy DCIS+ ALH +CSL; 4 cm apart, axilla negative, Tis NX stage 0       MEDICAL HISTORY:  Past Medical History:  Diagnosis Date  . Allergic rhinitis   . Chronic bronchitis    some mild wheezing at change of weather-  . Chronic fatigue   . Complication of anesthesia    pt reports she always has to be admitted after surgery with pulmon. consult  d/t respiratory history  . Decreased hearing     intol hearing aids. Deaf left ear, hearing aid right ear.  . Diabetes mellitus without complication ([place])   . Eustachian tube dysfunction    decreased hearing  . Fibromyalgia   . GERD (gastroesophageal reflux disease)    [place]  . Hx of myasthenia gravis   . Interstitial cystitis    [facility]-Dr. Karsten Ro sees periodically.(occ. self caths at home)  . Vertigo     SURGICAL HISTORY: Past Surgical History:  Procedure Laterality Date  . BREAST CYST ASPIRATION    . CHOLECYSTECTOMY N/A 01/14/2015   Procedure: LAPAROSCOPIC CHOLECYSTECTOMY WITH INTRAOPERATIVE CHOLANGIOGRAM;  Surgeon: Johnathan Hausen, MD;  Location: [facility];  Service: General;  Laterality: N/A;  . KNEE ARTHROSCOPY WITH MEDIAL MENISECTOMY Left 07/18/2016   Procedure: LEFT KNEE ARTHROSCOPY WITH MEDIAL MENISECTOMY dedribement and chrodralplasty;  Surgeon: Gaynelle Arabian, MD;  Location: [facility];  Service: Orthopedics;  Laterality: Left;  . NISSEN FUNDOPLICATION     tx of GERD    SOCIAL HISTORY: Social History   Socioeconomic History  . Marital status: Widowed    Spouse name: Not on file  . Number of children: 2  . Years of education: Not on file  . Highest education level: Not on file  Occupational History  . Not on file  Social Needs  . Financial resource strain: Not on file  . Food insecurity:    Worry: Not on file    Inability: Not on file  . Transportation needs:    Medical: Not on file    Non-medical: Not on file  Tobacco Use  .  Smoking status: Former Research scientist (life sciences)  . Smokeless tobacco: Former Systems developer    Quit date: 01/09/1974  Substance and Sexual Activity  . Alcohol use: No  . Drug use: No  . Sexual activity: Not on file  Lifestyle  . Physical activity:    Days per week: Not on file    Minutes per session: Not on file  . Stress: Not on file  Relationships  . Social connections:    Talks on phone: Not on file    Gets together: Not on file    Attends religious service: Not on file    Active member of club  or organization: Not on file    Attends meetings of clubs or organizations: Not on file    Relationship status: Not on file  . Intimate partner violence:    Fear of current or ex partner: Not on file    Emotionally abused: Not on file    Physically abused: Not on file    Forced sexual activity: Not on file  Other Topics Concern  . Not on file  Social History Narrative   Pt was adopted.  Lives alone in a one story home.  Has 2 children.  Retired from Veterinary surgeon work.     Education: some college.       FAMILY HISTORY: Family History  Adopted: Yes  Problem Relation Age of Onset  . Asthma Mother        biologic  . Breast cancer Mother        age 38  . Asthma Son   . Stroke Neg Hx   . Neuropathy Neg Hx   . Multiple sclerosis Neg Hx     ALLERGIES:  is allergic to cortisone; amlodipine besylate; chlorhexidine gluconate; clindamycin; dexamethasone; duloxetine; morphine sulfate; niacin; other; prednisone; rosuvastatin calcium; statins; sulfa antibiotics; valsartan; and strawberry extract.  MEDICATIONS:  Current Outpatient Medications  Medication Sig Dispense Refill  . albuterol (PROAIR HFA) 108 (90 Base) MCG/ACT inhaler Inhale 1-2 puffs into the lungs every 4 (four) hours as needed for wheezing or shortness of breath.     Marland Kitchen albuterol (PROVENTIL) (2.5 MG/3ML) 0.083% nebulizer solution Take 3 mLs (2.5 mg total) by nebulization every 6 (six) hours as needed for wheezing or shortness of breath. 25 vial 5  . azaTHIOprine (IMURAN) 50 MG tablet Take 1 tablet (50 mg total) by mouth daily. Take 50 mg daily x 2 weeks then increase to 100 mg daily. 60 tablet 5  . buPROPion (WELLBUTRIN SR) 200 MG 12 hr tablet Take 200 mg by mouth 2 (two) times daily after a meal.     . clonazePAM (KLONOPIN) 1 MG tablet Take 1 tablet by mouth See admin instructions. Take 1 tablet (1 mg) three times daily - morning, after supper and before bed  4  . conjugated estrogens (PREMARIN) vaginal cream Place vaginally.     . Fluticasone-Umeclidin-Vilant (TRELEGY ELLIPTA) 100-62.5-25 MCG/INH AEPB Inhale 1 puff into the lungs daily. 2 each 0  . GAMUNEX-C 5 GM/50ML SOLN   16  . GLYDO 2 % jelly APPLY TOPICALLY PRN.  2  . lidocaine (XYLOCAINE) 2 % jelly Apply topically.    . meclizine (ANTIVERT) 12.5 MG tablet Take 1 tablet (12.5 mg total) by mouth 3 (three) times daily as needed for dizziness. 30 tablet 3  . meloxicam (MOBIC) 15 MG tablet   1  . methocarbamol (ROBAXIN) 500 MG tablet Take 1 tablet (500 mg total) by mouth 4 (four) times daily. As needed for muscle spasm  30 tablet 1  . methylcellulose (CITRUCEL) oral powder Take daily by mouth.    Marland Kitchen omeprazole (PRILOSEC) 20 MG capsule Take 20 mg daily by mouth.    . polyethylene glycol (MIRALAX / GLYCOLAX) packet Take 17 g by mouth.    Marland Kitchen PRESCRIPTION MEDICATION Inhale into the lungs at bedtime. CPAP    . pyridostigmine (MESTINON) 60 MG tablet Take 1 tablet (60 mg total) by mouth 3 (three) times daily. 270 tablet 3  . trimethoprim (TRIMPEX) 100 MG tablet Take 100 mg by mouth daily.  11  . umeclidinium-vilanterol (ANORO ELLIPTA) 62.5-25 MCG/INH AEPB Inhale 1 puff into the lungs daily. 60 each 5   No current facility-administered medications for this visit.     REVIEW OF SYSTEMS:   Constitutional: Denies fevers, chills or abnormal night sweats Eyes: Denies blurriness of vision, double vision or watery eyes Ears, nose, mouth, throat, and face: Denies mucositis or sore throat Respiratory: Denies cough, dyspnea or wheezes Cardiovascular: Denies palpitation, chest discomfort or lower extremity swelling Gastrointestinal:  Denies nausea, heartburn or change in bowel habits Skin: Denies abnormal skin rashes Lymphatics: Denies new lymphadenopathy or easy bruising Neurological:Denies numbness, tingling or new weaknesses Behavioral/Psych: Mood is stable, no new changes  Breast:  Denies any palpable lumps or discharge All other systems were reviewed with the patient and are  negative.  PHYSICAL EXAMINATION: ECOG PERFORMANCE STATUS: 1 - Symptomatic but completely ambulatory  Vitals:   07/31/17 1238  BP: (!) 161/69  Pulse: 86  Resp: 18  Temp: 98 F (36.7 C)  SpO2: 98%   Filed Weights   07/31/17 1238  Weight: 155 lb 6.4 oz (70.5 kg)    GENERAL:alert, no distress and comfortable SKIN: skin color, texture, turgor are normal, no rashes or significant lesions EYES: normal, conjunctiva are pink and non-injected, sclera clear OROPHARYNX:no exudate, no erythema and lips, buccal mucosa, and tongue normal  NECK: supple, thyroid normal size, non-tender, without nodularity LYMPH:  no palpable lymphadenopathy in the cervical, axillary or inguinal LUNGS: clear to auscultation and percussion with normal breathing effort HEART: regular rate & rhythm and no murmurs and no lower extremity edema ABDOMEN:abdomen soft, non-tender and normal bowel sounds Musculoskeletal:no cyanosis of digits and no clubbing  PSYCH: alert & oriented x 3 with fluent speech NEURO: no focal motor/sensory deficits BREAST: No palpable nodules in breast. No palpable axillary or supraclavicular lymphadenopathy (exam performed in the presence of a chaperone)   LABORATORY DATA:  I have reviewed the data as listed Lab Results  Component Value Date   WBC 4.4 07/31/2017   HGB 12.6 07/31/2017   HCT 36.9 07/31/2017   MCV 97.3 07/31/2017   PLT 355 07/31/2017   Lab Results  Component Value Date   NA 135 (L) 07/31/2017   K 5.3 (H) 07/31/2017   CL 102 07/31/2017   CO2 27 07/31/2017    RADIOGRAPHIC STUDIES: I have personally reviewed the radiological reports and agreed with the findings in the report.  ASSESSMENT AND PLAN:  Ductal carcinoma in situ (DCIS) of right breast 07/19/2017 Screening detected right breast calcifications and distortion.  The distortion was in the upper outer quadrant: Biopsy DCIS intermediate grade with CSL; calcifications UIQ: 2.8 cm: Biopsy DCIS+ ALH +CSL; 4 cm  apart, axilla negative, Tis NX stage 0  Pathology review: I discussed with the patient the difference between DCIS and invasive breast cancer. It is considered a precancerous lesion. DCIS is classified as a 0. It is generally detected through mammograms as calcifications. We  discussed the significance of grades and its impact on prognosis. We also discussed the importance of ER and PR receptors and their implications to adjuvant treatment options. Prognosis of DCIS dependence on grade, comedo necrosis. It is anticipated that if not treated, 20-30% of DCIS can develop into invasive breast cancer.  Recommendation: 1. Breast conserving surgery 2. Followed by adjuvant radiation therapy 3. Followed by antiestrogen therapy with tamoxifen 5 years  Tamoxifen counseling: We discussed the risks and benefits of tamoxifen. These include but not limited to insomnia, hot flashes, mood changes, vaginal dryness, and weight gain. Although rare, serious side effects including endometrial cancer, risk of blood clots were also discussed. We strongly believe that the benefits far outweigh the risks. Patient understands these risks and consented to starting treatment. Planned treatment duration is 5 years.  I also discussed an alternative treatment plan with participation in clinical trial comet  AFT 25 COMET Phase 3 clinical trial for low risk DCIS grade 1/2 PR positive, age greater than 67 randomized to surgery +/- radiation, +/- endocrine therapy versus active surveillance with +/- endocrine therapy surveillance with mammograms every 6 months for 5 years;patient's have option to decline elevated arm and still be followed on study   Patient was provided with this information and she will make a decision and inform us.   All questions were answered. The patient knows to call the clinic with any problems, questions or concerns.    Harriette Ohara, MD 07/31/17

## 2017-07-31 NOTE — Assessment & Plan Note (Signed)
07/19/2017 Screening detected right breast calcifications and distortion.  The distortion was in the upper outer quadrant: Biopsy DCIS intermediate grade with CSL; calcifications UIQ: 2.8 cm: Biopsy DCIS+ ALH +CSL; 4 cm apart, axilla negative, Tis NX stage 0  Pathology review: I discussed with the patient the difference between DCIS and invasive breast cancer. It is considered a precancerous lesion. DCIS is classified as a 0. It is generally detected through mammograms as calcifications. We discussed the significance of grades and its impact on prognosis. We also discussed the importance of ER and PR receptors and their implications to adjuvant treatment options. Prognosis of DCIS dependence on grade, comedo necrosis. It is anticipated that if not treated, 20-30% of DCIS can develop into invasive breast cancer.  Recommendation: 1. Breast conserving surgery 2. Followed by adjuvant radiation therapy 3. Followed by antiestrogen therapy with tamoxifen 5 years  Tamoxifen counseling: We discussed the risks and benefits of tamoxifen. These include but not limited to insomnia, hot flashes, mood changes, vaginal dryness, and weight gain. Although rare, serious side effects including endometrial cancer, risk of blood clots were also discussed. We strongly believe that the benefits far outweigh the risks. Patient understands these risks and consented to starting treatment. Planned treatment duration is 5 years.  I also discussed an alternative treatment plan with participation in clinical trial comet  AFT 25 COMET Phase 3 clinical trial for low risk DCIS grade 1/2 PR positive, age greater than 50 randomized to surgery +/- radiation, +/- endocrine therapy versus active surveillance with +/- endocrine therapy surveillance with mammograms every 6 months for 5 years;patient's have option to decline elevated arm and still be followed on study   Patient was provided with this information and she will make a decision  and inform us.

## 2017-07-31 NOTE — Progress Notes (Signed)
Radiation Oncology         [phone] ________________________________  Initial Outpatient Consultation  Name: Erica Drake MRN: [id number]  Date: 07/31/2017  DOB: 07/26/1935  NU:UVOZ, Ravisankar, MD  Erroll Luna, MD   REFERRING PHYSICIAN: Erroll Luna, MD  DIAGNOSIS:    ICD-10-CM   1. Ductal carcinoma in situ (DCIS) of right breast D05.11    Stage 0 Right Breast UOQ-UIQ Ductal Carcinoma In Situ, ER(+) / PR(+), Intermediate Grade  CHIEF COMPLAINT: Here to discuss management of right breast DCIS  HISTORY OF PRESENT ILLNESS::Erica Drake is a 82 y.o. female who presented with screening detected right breast calcifications and a separate area of distortion on 07/02/17 mammography.  Diagnostic mammogram and ultrasound from 07/17/17 showed a focal area of architectural distortion with associated punctate microcalcifications in the anterior third of the upper outer right breast, as well as loosely grouped heterogeneous calcifications in the middle third of the upper inner quadrant of the right breast, spanning 2.8 cm.  Ultrasound of the right axilla was negative for lymphadenopathy.  Biopsy of both areas on 07/19/17 showed ductal carcinoma in situ with calcifications and characteristics as described above in the diagnosis.  Biopsy of the UIQ calcifications also revealed atypical lobular hyperplasia.  The patient reports a family history of breast cancer in her mother, diagnosed at age 14.  On review of systems, the patient states that "everything is wrong with her". She is positive for night sweats. She is positive for double vision related to her myasthenia gravis. She is positive for hearing loss and tinnitus and wears hearing aids. She is positive for sinus problems, sore throat, and mouth sores. She is positive for shortness of breath with walking and climbing stairs. She is positive for heartburn, change in stool habits, and abdominal pain. She is positive for incontinence, dysuria,  and hematuria related to having interstitial cystitis over past 30 years. She is positive for a lump and pain in her right breast. She is positive for a history of skin cancer. She is positive for back pain, joint pain, arthritis, and difficulty with ambulation. She is positive for forgetfulness. She is positive for anxiety and depression. She is positive for Type II DM and hot flashes.    Gynecologic History: -Age at first menstrual period: 59 -Are you still having periods?: No -Approximate date of last period: 1984 -Have you used hormone replacement?: No  Obstetric History: -How many children have you carried to term?: 2 -Your age at first live birth: 64 -Have you used birth control pills or hormone shots for contraception?: Yes  -For how long?: "Very shortly"  PREVIOUS RADIATION THERAPY: No  PAST MEDICAL HISTORY:  has a past medical history of Allergic rhinitis, Chronic bronchitis, Chronic fatigue, Complication of anesthesia, Decreased hearing, Diabetes mellitus without complication ([place]), Eustachian tube dysfunction, Fibromyalgia, GERD (gastroesophageal reflux disease), myasthenia gravis, Interstitial cystitis, and Vertigo.    PAST SURGICAL HISTORY: Past Surgical History:  Procedure Laterality Date  . BREAST CYST ASPIRATION    . CHOLECYSTECTOMY N/A 01/14/2015   Procedure: LAPAROSCOPIC CHOLECYSTECTOMY WITH INTRAOPERATIVE CHOLANGIOGRAM;  Surgeon: Johnathan Hausen, MD;  Location: [facility];  Service: General;  Laterality: N/A;  . KNEE ARTHROSCOPY WITH MEDIAL MENISECTOMY Left 07/18/2016   Procedure: LEFT KNEE ARTHROSCOPY WITH MEDIAL MENISECTOMY dedribement and chrodralplasty;  Surgeon: Gaynelle Arabian, MD;  Location: [facility];  Service: Orthopedics;  Laterality: Left;  . NISSEN FUNDOPLICATION     tx of GERD    FAMILY HISTORY: family history includes Asthma in  her mother and son; Breast cancer in her mother. She was adopted.  SOCIAL HISTORY:  reports that she has quit smoking. She quit  smokeless tobacco use about 43 years ago. She reports that she does not drink alcohol or use drugs. Widowed. 1 daughter and 1 son.   ALLERGIES: Cortisone; Amlodipine besylate; Chlorhexidine gluconate; Clindamycin; Dexamethasone; Duloxetine; Morphine sulfate; Niacin; Other; Prednisone; Rosuvastatin calcium; Statins; Sulfa antibiotics; Valsartan; and Strawberry extract  MEDICATIONS:  Current Outpatient Medications  Medication Sig Dispense Refill  . albuterol (PROAIR HFA) 108 (90 Base) MCG/ACT inhaler Inhale 1-2 puffs into the lungs every 4 (four) hours as needed for wheezing or shortness of breath.     Marland Kitchen albuterol (PROVENTIL) (2.5 MG/3ML) 0.083% nebulizer solution Take 3 mLs (2.5 mg total) by nebulization every 6 (six) hours as needed for wheezing or shortness of breath. 25 vial 5  . azaTHIOprine (IMURAN) 50 MG tablet Take 1 tablet (50 mg total) by mouth daily. Take 50 mg daily x 2 weeks then increase to 100 mg daily. 60 tablet 5  . buPROPion (WELLBUTRIN SR) 200 MG 12 hr tablet Take 200 mg by mouth 2 (two) times daily after a meal.     . clonazePAM (KLONOPIN) 1 MG tablet Take 1 tablet by mouth See admin instructions. Take 1 tablet (1 mg) three times daily - morning, after supper and before bed  4  . conjugated estrogens (PREMARIN) vaginal cream Place vaginally.    . Fluticasone-Umeclidin-Vilant (TRELEGY ELLIPTA) 100-62.5-25 MCG/INH AEPB Inhale 1 puff into the lungs daily. 2 each 0  . GAMUNEX-C 5 GM/50ML SOLN   16  . GLYDO 2 % jelly APPLY TOPICALLY PRN.  2  . lidocaine (XYLOCAINE) 2 % jelly Apply topically.    . meclizine (ANTIVERT) 12.5 MG tablet Take 1 tablet (12.5 mg total) by mouth 3 (three) times daily as needed for dizziness. 30 tablet 3  . meloxicam (MOBIC) 15 MG tablet   1  . methocarbamol (ROBAXIN) 500 MG tablet Take 1 tablet (500 mg total) by mouth 4 (four) times daily. As needed for muscle spasm 30 tablet 1  . methylcellulose (CITRUCEL) oral powder Take daily by mouth.    Marland Kitchen omeprazole  (PRILOSEC) 20 MG capsule Take 20 mg daily by mouth.    . polyethylene glycol (MIRALAX / GLYCOLAX) packet Take 17 g by mouth.    Marland Kitchen PRESCRIPTION MEDICATION Inhale into the lungs at bedtime. CPAP    . pyridostigmine (MESTINON) 60 MG tablet Take 1 tablet (60 mg total) by mouth 3 (three) times daily. 270 tablet 3  . trimethoprim (TRIMPEX) 100 MG tablet Take 100 mg by mouth daily.  11  . umeclidinium-vilanterol (ANORO ELLIPTA) 62.5-25 MCG/INH AEPB Inhale 1 puff into the lungs daily. 60 each 5   No current facility-administered medications for this encounter.     REVIEW OF SYSTEMS: A 10+ POINT REVIEW OF SYSTEMS WAS OBTAINED including neurology, dermatology, psychiatry, cardiac, respiratory, lymph, extremities, GI, GU, Musculoskeletal, constitutional, breasts, reproductive, HEENT.  All pertinent positives are noted in the HPI.  All others are negative.   PHYSICAL EXAM:  Vitals with BMI 07/31/2017  Height 5\' 5"   Weight 155 lbs 6 oz  BMI 40.08  Systolic 676  Diastolic 69  Pulse 86  Respirations 18   General: Alert and oriented, in no acute distress. HEENT: Head is normocephalic. Extraocular movements are intact. Oropharynx is clear. Neck: Neck is supple, no palpable cervical or supraclavicular lymphadenopathy. Heart: Regular in rate and rhythm with no murmurs, rubs, or  gallops. Chest: Clear to auscultation bilaterally Abdomen: Soft, nontender, nondistended, with no rigidity or guarding. Extremities: No cyanosis or edema. Lymphatics: see Neck Exam Skin: No concerning lesions. Musculoskeletal: She has decreased strength on hip flexion bilaterally. Neurologic: Cranial nerves II through XII are grossly intact. No obvious focalities. Speech is fluent. Coordination is intact. Psychiatric: Judgment and insight are intact. Affect is appropriate. Breasts: She has some thickening at the biopsy site in the upper outer right breast. No other palpable masses appreciated in the breasts or axillae  bilaterally.   ECOG = 2  0 - Asymptomatic (Fully active, able to carry on all predisease activities without restriction)  1 - Symptomatic but completely ambulatory (Restricted in physically strenuous activity but ambulatory and able to carry out work of a light or sedentary nature. For example, light housework, office work)  2 - Symptomatic, <50% in bed during the day (Ambulatory and capable of all self care but unable to carry out any work activities. Up and about more than 50% of waking hours)  3 - Symptomatic, >50% in bed, but not bedbound (Capable of only limited self-care, confined to bed or chair 50% or more of waking hours)  4 - Bedbound (Completely disabled. Cannot carry on any self-care. Totally confined to bed or chair)  5 - Death   Eustace Pen MM, Creech RH, Tormey DC, et al. [phone]). "Toxicity and response criteria of the [facility] Group". [place] Oncol. 5 (6): 649-55   LABORATORY DATA:  Lab Results  Component Value Date   WBC 4.4 07/31/2017   HGB 12.6 07/31/2017   HCT 36.9 07/31/2017   MCV 97.3 07/31/2017   PLT 355 07/31/2017   CMP     Component Value Date/Time   NA 135 (L) 07/31/2017 1209   NA 142 08/26/2015 1153   K 5.3 (H) 07/31/2017 1209   CL 102 07/31/2017 1209   CO2 27 07/31/2017 1209   GLUCOSE 86 07/31/2017 1209   BUN 15 07/31/2017 1209   BUN 20 08/26/2015 1153   CREATININE 1.37 (H) 07/31/2017 1209   CALCIUM 9.2 07/31/2017 1209   PROT 9.4 (H) 07/31/2017 1209   ALBUMIN 3.3 (L) 07/31/2017 1209   AST 24 07/31/2017 1209   ALT 9 07/31/2017 1209   ALKPHOS 60 07/31/2017 1209   BILITOT <0.2 (L) 07/31/2017 1209   GFRNONAA 36 (L) 07/31/2017 1209   GFRAA 41 (L) 07/31/2017 1209         RADIOGRAPHY: US Breast Ltd Uni Right Inc Axilla  Result Date: 07/17/2017 CLINICAL DATA:  82 year old patient recalled from recent screening mammogram for evaluation of possible architectural distortion in the right breast and for a separate area of  calcifications in the upper inner right breast. Patient states that she has had many breast cysts in the past, many of which were aspirated. She denies any history of breast surgery. EXAM: DIGITAL DIAGNOSTIC RIGHT MAMMOGRAM WITH TOMO ULTRASOUND RIGHT BREAST COMPARISON:  Jul 02, 2017 ACR Breast Density Category c: The breast tissue is heterogeneously dense, which may obscure small masses. FINDINGS: Focal spot compression views with tomography confirm a persistent area of architectural distortion without a discrete mass in the upper outer right breast, anterior third. There are multiple punctate calcifications within the area of architectural distortion. Magnification views of the upper inner right breast, middle third, confirm a loose group of heterogeneous microcalcifications. These calcifications span approximately 2.8 x 2.0 x 1.8 cm. Some of the calcifications are linearly arranged. There is no associated mass or  distortion. On physical exam, no mass is palpated in the upper outer periareolar right breast. Targeted ultrasound is performed, showing no evidence of mass in the periareolar upper outer right breast. Configuration of small ducts in the upper outer periareolar right breast suggests that it may be related to the mammographically visible architectural distortion. The distortion is best seen mammographically, however. There is no abnormal shadowing. Ultrasound of the right axilla is negative for lymphadenopathy. IMPRESSION: 1. Focal area of architectural distortion with associated punctate microcalcifications in the anterior third of the upper outer right breast, best seen by mammography. Findings are favored to be due to complex sclerosing lesion, but malignancy cannot be excluded. 2. Loosely grouped heterogeneous calcifications in the upper inner quadrant of the right breast, middle third. Ductal carcinoma in situ cannot be excluded. RECOMMENDATION: Two separate stereotactic biopsies of the right breast  are recommended, including the area of architectural distortion in the anterior third upper outer quadrant and a second biopsy of heterogeneous microcalcifications in the middle third of the upper inner quadrant. I have discussed the findings and recommendations with the patient. Results were also provided in writing at the conclusion of the visit. If applicable, a reminder letter will be sent to the patient regarding the next appointment. BI-RADS CATEGORY  4: Suspicious. Electronically Signed   By: Curlene Dolphin M.D.   On: 07/17/2017 14:35   Mm Diag Breast Tomo Uni Right  Result Date: 07/17/2017 CLINICAL DATA:  82 year old patient recalled from recent screening mammogram for evaluation of possible architectural distortion in the right breast and for a separate area of calcifications in the upper inner right breast. Patient states that she has had many breast cysts in the past, many of which were aspirated. She denies any history of breast surgery. EXAM: DIGITAL DIAGNOSTIC RIGHT MAMMOGRAM WITH TOMO ULTRASOUND RIGHT BREAST COMPARISON:  Jul 02, 2017 ACR Breast Density Category c: The breast tissue is heterogeneously dense, which may obscure small masses. FINDINGS: Focal spot compression views with tomography confirm a persistent area of architectural distortion without a discrete mass in the upper outer right breast, anterior third. There are multiple punctate calcifications within the area of architectural distortion. Magnification views of the upper inner right breast, middle third, confirm a loose group of heterogeneous microcalcifications. These calcifications span approximately 2.8 x 2.0 x 1.8 cm. Some of the calcifications are linearly arranged. There is no associated mass or distortion. On physical exam, no mass is palpated in the upper outer periareolar right breast. Targeted ultrasound is performed, showing no evidence of mass in the periareolar upper outer right breast. Configuration of small ducts in the  upper outer periareolar right breast suggests that it may be related to the mammographically visible architectural distortion. The distortion is best seen mammographically, however. There is no abnormal shadowing. Ultrasound of the right axilla is negative for lymphadenopathy. IMPRESSION: 1. Focal area of architectural distortion with associated punctate microcalcifications in the anterior third of the upper outer right breast, best seen by mammography. Findings are favored to be due to complex sclerosing lesion, but malignancy cannot be excluded. 2. Loosely grouped heterogeneous calcifications in the upper inner quadrant of the right breast, middle third. Ductal carcinoma in situ cannot be excluded. RECOMMENDATION: Two separate stereotactic biopsies of the right breast are recommended, including the area of architectural distortion in the anterior third upper outer quadrant and a second biopsy of heterogeneous microcalcifications in the middle third of the upper inner quadrant. I have discussed the findings and recommendations with the  patient. Results were also provided in writing at the conclusion of the visit. If applicable, a reminder letter will be sent to the patient regarding the next appointment. BI-RADS CATEGORY  4: Suspicious. Electronically Signed   By: Curlene Dolphin M.D.   On: 07/17/2017 14:35   Mm 3d Screen Breast Bilateral  Result Date: 07/02/2017 CLINICAL DATA:  Screening. EXAM: DIGITAL SCREENING BILATERAL MAMMOGRAM WITH TOMO AND CAD COMPARISON:  Previous exam(s). ACR Breast Density Category c: The breast tissue is heterogeneously dense, which may obscure small masses. FINDINGS: In the right breast, calcifications as well as a separate area of possible distortion warrant further evaluation with magnified views. In the left breast, no findings suspicious for malignancy. Images were processed with CAD. IMPRESSION: Further evaluation is suggested for calcifications as well as a separate area of  possible distortion in the right breast. RECOMMENDATION: Diagnostic mammogram of the right breast. (Code:[id number]) The patient will be contacted regarding the findings, and additional imaging will be scheduled. BI-RADS CATEGORY  0: Incomplete. Need additional imaging evaluation and/or prior mammograms for comparison. Electronically Signed   By: Marin Olp M.D.   On: 07/02/2017 15:07   Mm Clip Placement Right  Result Date: 07/19/2017 CLINICAL DATA:  Status post stereotactic guided core needle biopsies of an area of architectural distortion in the upper-outer quadrant of the right breast and a 2.8 cm group of calcifications in the upper inner quadrant of the right breast. EXAM: DIAGNOSTIC RIGHT MAMMOGRAM POST STEREOTACTIC BIOPSY COMPARISON:  Previous exam(s). FINDINGS: Mammographic images were obtained following stereotactic guided biopsy of the recently demonstrated architectural distortion in the upper-outer quadrant of the right breast and 2.8 cm group of calcifications in the upper inner quadrant of the right breast. These demonstrate a coil shaped biopsy marker clip at the location of the biopsied distortion in the upper-outer quadrant of the right breast and an X shaped biopsy marker clip at the location of the biopsied calcifications in the upper inner quadrant of the right breast. IMPRESSION: Appropriate deployment of the coil shaped biopsy marker clip at the location of architectural distortion biopsied in the upper-outer quadrant of the right breast and appropriate deployment of the X shaped biopsy marker clip at the location of the biopsied calcifications in the upper inner quadrant of the right breast. Final Assessment: Post Procedure Mammograms for Marker Placement Electronically Signed   By: Claudie Revering M.D.   On: 07/19/2017 12:03   Mm Rt Breast Bx W Loc Dev 1st Lesion Image Bx Spec Stereo Guide  Addendum Date: 07/24/2017   ADDENDUM REPORT: 07/23/2017 12:18 ADDENDUM: Pathology revealed  INTERMEDIATE GRADE DUCTAL CARCINOMA IN SITU WITH CALCIFICATIONS of the Right breast, upper outer quadrant. INTERMEDIATE GRADE DUCTAL CARCINOMA IN SITU WITH CALCIFICATIONS, LOBULAR NEOPLASIA (ATYPICAL LOBULAR HYPERPLASIA) of the Right breast, upper inner quadrant. This was found to be concordant by Dr. Claudie Revering. Pathology results were discussed with the patient by telephone. The patient reported doing well after the biopsies with tenderness at the sites. Post biopsy instructions and care were reviewed and questions were answered. The patient was encouraged to call [facility] for any additional concerns. The patient was referred to [facility] on July 31, 2017. Pathology results reported by Terie Purser, RN on 07/23/2017. Electronically Signed   By: Claudie Revering M.D.   On: 07/23/2017 12:18   Result Date: 07/24/2017 CLINICAL DATA:  Architectural distortion in the upper-outer quadrant of the right  breast and 2.8 cm group of indeterminate calcifications in the upper inner quadrant of the right breast at recent mammography. EXAM: RIGHT BREAST STEREOTACTIC CORE NEEDLE BIOPSY X  2 COMPARISON:  Previous exams. FINDINGS: The patient and I discussed the procedure of stereotactic-guided biopsy including benefits and alternatives. We discussed the high likelihood of a successful procedure. We discussed the risks of the procedure including infection, bleeding, tissue injury, clip migration, and inadequate sampling. Informed written consent was given. The usual time out protocol was performed immediately prior to the procedure. SITE 1: ARCHITECTURAL DISTORTION IN THE UPPER-OUTER QUADRANT OF THE RIGHT BREAST Using sterile technique and 1% Lidocaine as local anesthetic, under stereotactic guidance, a 9 gauge vacuum assisted device was used to perform core needle biopsy of the recently demonstrated architectural distortion in  the upper-outer quadrant of the right breast using a cephalad approach. Lesion quadrant: Upper outer quadrant At the conclusion of the procedure, a coil shaped tissue marker clip was deployed into the biopsy cavity. Follow-up 2-view mammogram was performed and dictated separately. SITE 2: 2.8 CM GROUP OF CALCIFICATIONS IN THE UPPER INNER QUADRANT OF THE RIGHT BREAST Using sterile technique and 1% Lidocaine as local anesthetic, under stereotactic guidance, a 9 gauge vacuum assisted device was used to perform core needle biopsy of the recently demonstrated 2.8 cm group of calcifications in the upper inner quadrant of the right breast using a cephalad approach. Specimen radiograph was performed showing multiple calcifications in multiple specimens. Specimens with calcifications are identified for pathology. Lesion quadrant: Upper inner quadrant At the conclusion of the procedure, a X shaped tissue marker clip was deployed into the biopsy cavity. Follow-up 2-view mammogram was performed and dictated separately. IMPRESSION: Stereotactic-guided biopsy of architectural distortion in the upper-outer quadrant of the right breast and 2.8 cm group of calcifications in the upper inner quadrant of the right breast. No apparent complications. Electronically Signed: By: Claudie Revering M.D. On: 07/19/2017 11:48   Mm Rt Breast Bx W Loc Dev Ea Ad Lesion Img Bx Spec Stereo Guide  Addendum Date: 07/24/2017   ADDENDUM REPORT: 07/23/2017 12:18 ADDENDUM: Pathology revealed INTERMEDIATE GRADE DUCTAL CARCINOMA IN SITU WITH CALCIFICATIONS of the Right breast, upper outer quadrant. INTERMEDIATE GRADE DUCTAL CARCINOMA IN SITU WITH CALCIFICATIONS, LOBULAR NEOPLASIA (ATYPICAL LOBULAR HYPERPLASIA) of the Right breast, upper inner quadrant. This was found to be concordant by Dr. Claudie Revering. Pathology results were discussed with the patient by telephone. The patient reported doing well after the biopsies with tenderness at the sites. Post biopsy  instructions and care were reviewed and questions were answered. The patient was encouraged to call [facility] for any additional concerns. The patient was referred to [facility] at [facility] on July 31, 2017. Pathology results reported by Terie Purser, RN on 07/23/2017. Electronically Signed   By: Claudie Revering M.D.   On: 07/23/2017 12:18   Result Date: 07/24/2017 CLINICAL DATA:  Architectural distortion in the upper-outer quadrant of the right breast and 2.8 cm group of indeterminate calcifications in the upper inner quadrant of the right breast at recent mammography. EXAM: RIGHT BREAST STEREOTACTIC CORE NEEDLE BIOPSY X  2 COMPARISON:  Previous exams. FINDINGS: The patient and I discussed the procedure of stereotactic-guided biopsy including benefits and alternatives. We discussed the high likelihood of a successful procedure. We discussed the risks of the procedure including infection, bleeding, tissue injury, clip migration, and inadequate sampling. Informed written consent was given. The usual  time out protocol was performed immediately prior to the procedure. SITE 1: ARCHITECTURAL DISTORTION IN THE UPPER-OUTER QUADRANT OF THE RIGHT BREAST Using sterile technique and 1% Lidocaine as local anesthetic, under stereotactic guidance, a 9 gauge vacuum assisted device was used to perform core needle biopsy of the recently demonstrated architectural distortion in the upper-outer quadrant of the right breast using a cephalad approach. Lesion quadrant: Upper outer quadrant At the conclusion of the procedure, a coil shaped tissue marker clip was deployed into the biopsy cavity. Follow-up 2-view mammogram was performed and dictated separately. SITE 2: 2.8 CM GROUP OF CALCIFICATIONS IN THE UPPER INNER QUADRANT OF THE RIGHT BREAST Using sterile technique and 1% Lidocaine as local anesthetic, under stereotactic guidance, a 9 gauge  vacuum assisted device was used to perform core needle biopsy of the recently demonstrated 2.8 cm group of calcifications in the upper inner quadrant of the right breast using a cephalad approach. Specimen radiograph was performed showing multiple calcifications in multiple specimens. Specimens with calcifications are identified for pathology. Lesion quadrant: Upper inner quadrant At the conclusion of the procedure, a X shaped tissue marker clip was deployed into the biopsy cavity. Follow-up 2-view mammogram was performed and dictated separately. IMPRESSION: Stereotactic-guided biopsy of architectural distortion in the upper-outer quadrant of the right breast and 2.8 cm group of calcifications in the upper inner quadrant of the right breast. No apparent complications. Electronically Signed: By: Claudie Revering M.D. On: 07/19/2017 11:48      IMPRESSION/PLAN: Right Breast DCIS  She is still undecided about which treatment options she would like to pursue for her DCIS. I went over the nomogram data from [facility]. She understands that with lumpectomy alone, her risk of recurrence over a decade would be approximately 13%. With addition of radiation after lumpectomy, the risk would be reduced to 5%, and with lumpectomy + anti-estrogen therapy, the risk would be 6%. For the alternative to treatment, she could pursue the COMET trial which may randomize her to active surveillance. She will talk to one of our research nurses now about the trial in greater depth. I acknowledged the risks, benefits, and side effects of radiotherapy for her. She understands that side effects could include fatigue, and this is a personal concern for her given her myasthenia gravis. I am happy to see her back for follow-up as needed. If she wishes to pursue radiotherapy, it would be given over 3-4 weeks.   __________________________________________   Eppie Gibson, MD  This document serves as a record of services personally performed by Eppie Gibson, MD. It was created on her behalf by Rae Lips, a trained medical scribe. The creation of this record is based on the scribe's personal observations and the provider's statements to them. This document has been checked and approved by the attending provider.

## 2017-08-01 ENCOUNTER — Telehealth: Payer: Self-pay

## 2017-08-01 NOTE — Telephone Encounter (Signed)
Returned pt call regarding f/u with Dr. Lindi Adie. Patient scheduled for July 1st, 2pm. Patient aware of date/time.  Cyndia Bent RN

## 2017-08-08 ENCOUNTER — Telehealth: Payer: Self-pay | Admitting: *Deleted

## 2017-08-08 ENCOUNTER — Encounter: Payer: Self-pay | Admitting: General Practice

## 2017-08-08 NOTE — Progress Notes (Signed)
[place] Psychosocial Distress Screening Spiritual Care  Spoke with Erica Drake at length by phone today following [facility] on 07/31/17 to introduce Support Center team/resources, reviewing distress screen per protocol.  The patient scored a 3 on the Psychosocial Distress Thermometer which indicates mild distress. Also assessed for distress and other psychosocial needs.   ONCBCN DISTRESS SCREENING 08/08/2017  Screening Type Initial Screening  Distress experienced in past week (1-10) 3  Practical problem type Housing;Insurance  Information Concerns Type Lack of info about diagnosis;Lack of info about complementary therapy choices;Lack of info about treatment;Lack of info about maintaining fitness  Physical Problem type Pain  Referral to support programs Yes   Erica Drake was very receptive to [facility], noting that she is in significant life transition, preparing to move from [place] to [place] on June 18. Her husband of 10 years died in 03-28-2022, so she is living with grief in general, plus the specific layer of preparing to leave the last place where they lived together. Because of this loss and her children's busy stages of life, Erica Drake experiences intermittent loneliness. Additionally, her fibromyalgia and myasthenia gravis cause her to need what she describes as a "very frustrating" amount of rest, which can also be isolating.  Lia utilized opportunity well to share and process her life experiences, transitions, and feelings, finding opportunities to laugh and apply perspective to her meaning-making.   Follow up needed: Yes.   We plan to f/u in my office after her appt with Dr Lindi Adie on [place]. Pt also knows to contact me before then as needed/desired, but please also page if immediate needs arise or circumstances change.  [place], [place], [facility] Pager [phone] Voicemail [phone]

## 2017-08-08 NOTE — Telephone Encounter (Signed)
Spoke with patient today to follow up from [facility].  She states she is moving right now to [place] and doesn't want to make any decisions regarding treatment, but she states she is leaning towards the COMET trial.  She will see Dr. Lindi Adie 7/1 to discuss further.

## 2017-08-14 DIAGNOSIS — N3010 Interstitial cystitis (chronic) without hematuria: Secondary | ICD-10-CM | POA: Diagnosis not present

## 2017-08-26 ENCOUNTER — Telehealth: Payer: Self-pay

## 2017-08-26 ENCOUNTER — Inpatient Hospital Stay: Payer: Medicare Other | Admitting: Hematology and Oncology

## 2017-08-26 ENCOUNTER — Telehealth: Payer: Self-pay | Admitting: Hematology and Oncology

## 2017-08-26 NOTE — Telephone Encounter (Signed)
Returned pt's call regarding she has to cancel today's appt and reschedule due to she has myasthenia gravis (MG) and is having double vision today (which is known symptom she has had on and off)  and can not drive to appt so prefers to reschedule. Reports she has infusions every 3 weeks for her MG and has appt with her MG physician next week. Appt rescheduled for 7/31 with Dr Lindi Adie..  Pt reports she has moved to [facility] - will update her address.  No other needs per pt at this time.

## 2017-08-26 NOTE — Telephone Encounter (Signed)
patient reschedule she is not feeling well she is said she is seeing double

## 2017-08-28 ENCOUNTER — Encounter: Payer: Self-pay | Admitting: General Practice

## 2017-08-28 NOTE — Progress Notes (Signed)
[place] Spiritual Care Note  Spoke with Joycelyn Schmid by phone for [facility] f/u. We plan to meet in my office at 2pm on Thursday, August 1 to provide her an opportunity to discuss and process her discernment about what treatment path she would like to follow after having been able to see her PCP and Dr Lindi Adie.   [place], [place], [facility] Pager [phone] Voicemail [phone]

## 2017-09-03 DIAGNOSIS — F3289 Other specified depressive episodes: Secondary | ICD-10-CM | POA: Diagnosis not present

## 2017-09-03 DIAGNOSIS — I10 Essential (primary) hypertension: Secondary | ICD-10-CM | POA: Diagnosis not present

## 2017-09-03 DIAGNOSIS — D0511 Intraductal carcinoma in situ of right breast: Secondary | ICD-10-CM | POA: Diagnosis not present

## 2017-09-03 DIAGNOSIS — N3010 Interstitial cystitis (chronic) without hematuria: Secondary | ICD-10-CM | POA: Diagnosis not present

## 2017-09-03 DIAGNOSIS — J45998 Other asthma: Secondary | ICD-10-CM | POA: Diagnosis not present

## 2017-09-03 DIAGNOSIS — M25561 Pain in right knee: Secondary | ICD-10-CM | POA: Diagnosis not present

## 2017-09-03 DIAGNOSIS — Z6824 Body mass index (BMI) 24.0-24.9, adult: Secondary | ICD-10-CM | POA: Diagnosis not present

## 2017-09-03 DIAGNOSIS — N816 Rectocele: Secondary | ICD-10-CM | POA: Diagnosis not present

## 2017-09-03 DIAGNOSIS — G7000 Myasthenia gravis without (acute) exacerbation: Secondary | ICD-10-CM | POA: Diagnosis not present

## 2017-09-05 ENCOUNTER — Telehealth: Payer: Self-pay | Admitting: Neurology

## 2017-09-05 NOTE — Telephone Encounter (Signed)
Patient would like Dr. Serita Grit opinion and possible referral.  [facility] is booked out too far.

## 2017-09-05 NOTE — Telephone Encounter (Signed)
Patient requesting a call from nurse Caryl Pina to discuss getting an appt with Dr.Devisha to put a gel in her knee dut to the pain she is having.

## 2017-09-09 DIAGNOSIS — M170 Bilateral primary osteoarthritis of knee: Secondary | ICD-10-CM | POA: Diagnosis not present

## 2017-09-10 ENCOUNTER — Telehealth: Payer: Self-pay | Admitting: Neurology

## 2017-09-10 DIAGNOSIS — F341 Dysthymic disorder: Secondary | ICD-10-CM | POA: Diagnosis not present

## 2017-09-10 NOTE — Telephone Encounter (Signed)
She is seeing me on 7/18. I'll get more information from her at her clinic visit as we have not discussed this and is probably best addressed by her PCP.  No contraindications from neurological standpoint to get injections to her knees.

## 2017-09-10 NOTE — Telephone Encounter (Signed)
Opened in error

## 2017-09-12 ENCOUNTER — Encounter: Payer: Self-pay | Admitting: Neurology

## 2017-09-12 ENCOUNTER — Ambulatory Visit (INDEPENDENT_AMBULATORY_CARE_PROVIDER_SITE_OTHER): Payer: Medicare Other | Admitting: Neurology

## 2017-09-12 VITALS — BP 120/80 | HR 86 | Ht 65.0 in | Wt 150.1 lb

## 2017-09-12 DIAGNOSIS — G7000 Myasthenia gravis without (acute) exacerbation: Secondary | ICD-10-CM

## 2017-09-12 MED ORDER — AZATHIOPRINE 50 MG PO TABS: 100.0000 mg | ORAL_TABLET | Freq: Every day | ORAL | 3 refills | Status: DC

## 2017-09-12 MED ORDER — PYRIDOSTIGMINE BROMIDE 60 MG PO TABS: 60.0000 mg | ORAL_TABLET | Freq: Three times a day (TID) | ORAL | 3 refills | Status: DC

## 2017-09-12 NOTE — Progress Notes (Signed)
Follow-up Visit   Date: 09/12/17    RIATA IKEDA MRN: [id number] DOB: 19-May-1935   Interim History: Erica Drake is a 82 y.o. right-handed Caucasian female with fibromyalgia, GERD, depression, left sensorineural deficit, interstitial cystitis, and type 2 diabetes mellitus returning to the clinic for follow-up of myasthenia gravis.  The patient was accompanied to the clinic by self.  History of present illness: Initial visit 09/29/2015:  In May 2017, she began experiencing diurnal double vision, with images diagonal of each other and mild changes to her speech.  She has seen her ENT, PCP, ophthalmologist, and then saw Dr. Jaynee Eagles in June who ordered MRI/A head and orbit which was normal.  Her acetylcholine antibodies returned positive.  A prescription for mestinon 5m three times daily was ordered, but patient did not start it.  She has many drug intolerances/allergies, including cortisone, so medications were started at a low dose.   In the fall of 2017, I started prednisone 13mand increased mestinon to 6056mID which did not provide any relief. She stopped taking her prednisone because she has a number of other problems including diverticulosis and bronchitis, which she thought would be worsened by prednisone. Despite stopping her prednisone, there has been no change in her double vision.  [facility] and was recommended to have SFEM[facility], but she did not wish to return for testing.    She was managed on mestinon 44m56mD alone for most of 2018 due to intolerance with prednisone.  Unfortunately, she was hospitalized from 10/12 - 12/12/2016 with MG exacerbation manifesting with head drop, ptosis, and dysphagia.  She was treated with IVIG x 5 days with improvement in dysphasia and weakness.  However, neck heaviness persisted, so she was started on monthly IVIG.  She was also on prednisone 20mg98m discontinued this after developing mouth sores.  When she gets tired, her head starts to feel heavy.  In early 2019, she started maintenance dose of IVIG and still did not appreciate marked benefit, so the dose was increased to 1g/kg.  Azathioprine was started in February 2019 as a steroid-sparing medication.   In late Janua02/14/24 husband passed away.  She did not tolerate prednisone 10mg 20mth ulcers), so stopped it.   UPDATE 07/16/2017:  She is here for follow-up visit and continues to do well with IVIG every 3 weeks and azathioprine 100mg/d41me has very rare spells of double vision and difficulty swallowing, but overall is feeling much stronger. She had surgery for rectocele two weeks ago and is recovering from this.  [place].    UPDATE 09/12/2017:  She is here for follow-up visit.  Her myasthenia gravis is well-controlled on medication and she has not had any breakthrough double vision, head weakness, or ptosis.  She remains on IVIG every 3 weeks and azathioprine 100mg/d.43me was newly diagnosed with breast cancer and due to her move into [facility] has been unable to schedule appointment with her surgeon.   She is also having significant knee pain and is followed by Dr. Landau wMardelle Matteformed injections earlier this week.  She has many questions regarding her care team and medical comorbidities, most of which is beyond the realm of neurology and my expertise.   Medications:  Current Outpatient Medications on File Prior to Visit  Medication Sig Dispense Refill  . albuterol (PROAIR HFA) 108 (90 Base) MCG/ACT inhaler Inhale 1-2 puffs  into the lungs every 4 (four) hours as needed for wheezing or shortness of breath.     Marland Kitchen albuterol (PROVENTIL) (2.5 MG/3ML) 0.083% nebulizer solution Take 3 mLs (2.5 mg total) by nebulization every 6 (six) hours as needed for wheezing or shortness of breath. 25 vial 5  . buPROPion (WELLBUTRIN SR) 200 MG 12 hr tablet Take 200 mg by mouth 2 (two) times daily after a meal.     . clonazePAM  (KLONOPIN) 1 MG disintegrating tablet DIS ONE T PO QAM AND 3 TS QHS  5  . conjugated estrogens (PREMARIN) vaginal cream Place vaginally.    . Fluticasone-Umeclidin-Vilant (TRELEGY ELLIPTA) 100-62.5-25 MCG/INH AEPB Inhale 1 puff into the lungs daily. 2 each 0  . GAMUNEX-C 5 GM/50ML SOLN   16  . GLYDO 2 % jelly APPLY TOPICALLY PRN.  2  . lidocaine (XYLOCAINE) 2 % jelly Apply topically.    . meclizine (ANTIVERT) 12.5 MG tablet Take 1 tablet (12.5 mg total) by mouth 3 (three) times daily as needed for dizziness. 30 tablet 3  . meloxicam (MOBIC) 15 MG tablet   1  . methocarbamol (ROBAXIN) 500 MG tablet Take 1 tablet (500 mg total) by mouth 4 (four) times daily. As needed for muscle spasm 30 tablet 1  . methylcellulose (CITRUCEL) oral powder Take daily by mouth.    Marland Kitchen omeprazole (PRILOSEC) 20 MG capsule Take 20 mg daily by mouth.    . polyethylene glycol (MIRALAX / GLYCOLAX) packet Take 17 g by mouth.    Marland Kitchen PRESCRIPTION MEDICATION Inhale into the lungs at bedtime. CPAP    . trimethoprim (TRIMPEX) 100 MG tablet Take 100 mg by mouth daily.  11  . umeclidinium-vilanterol (ANORO ELLIPTA) 62.5-25 MCG/INH AEPB Inhale 1 puff into the lungs daily. 60 each 5   No current facility-administered medications on file prior to visit.     Allergies:  Allergies  Allergen Reactions  . Cortisone Shortness Of Breath, Swelling and Other (See Comments)    Tongue swelling   . Amlodipine Besylate Cough  . Chlorhexidine Gluconate Itching    Skin on face turned red   . Clindamycin Itching  . Dexamethasone Other (See Comments)    Per MD  . Duloxetine Other (See Comments)    Unknown reaction  . Morphine Sulfate Other (See Comments)    Unknown reaction  . Niacin Other (See Comments)  . Other Other (See Comments)    Pollen - nasal reaction  . Prednisone Other (See Comments)    Causes stomach pain. Sores in her mouth.   . Rosuvastatin Calcium Other (See Comments)    Muscle aches  . Statins Other (See Comments)      Muscle aches  . Sulfa Antibiotics Other (See Comments)    May possibly have caused deafness in one ear  . Valsartan Other (See Comments)    Unknown reaction  . Strawberry Extract Itching and Rash    Review of Systems:  CONSTITUTIONAL: No fevers, chills, night sweats, or weight loss.  EYES: No visual changes or eye pain ENT: No hearing changes.  No history of nose bleeds.   RESPIRATORY: No cough, wheezing and shortness of breath.   CARDIOVASCULAR: Negative for chest pain, and palpitations.   GI: Negative for abdominal discomfort, blood in stools or black stools.  No recent change in bowel habits.   GU:  No history of incontinence.   MUSCLOSKELETAL: +history of joint pain or swelling.  No myalgias.   SKIN: Negative for lesions, rash, and itching.  ENDOCRINE: Negative for cold or heat intolerance, polydipsia or goiter.  + cancer PSYCH:  No depression or anxiety symptoms.   NEURO: As Above.   Vital Signs:  BP 120/80   Pulse 86   Ht 5' 5"  (1.651 m)   Wt 150 lb 2 oz (68.1 kg)   SpO2 94%   BMI 24.98 kg/m   Neurological Exam: MENTAL STATUS including orientation to time, place, person, recent and remote memory, attention span and concentration, language, and fund of knowledge is normal.  Speech is not dysarthric or soft.  CRANIAL NERVES:  Pupils equal round and reactive to light.  Normal conjugate, extra-ocular eye movements in all directions of gaze.  No ptosis at rest.  Face is symmetric. Buccinator muscles are 5/5, orbicularis oculi and oris is 5/5. Tongue is midline and strength is 5/5  MOTOR:  Motor strength is 5/5 in all extremities including hip flexion and neck flexion.  Knee extension and flexion is limited by knee pain.   COORDINATION/GAIT:  Gait is antalgic due to bilateral knee pain, unassisted and stable.   Data: Labs 08/26/2015:  AChR binding (0.63) blocking (28) antibody, ESR 3, CRP 4.9  MRI/A head 09/09/2015: This MR angiogram of the intracerebral arteries  shows the following: 1. Very minimal atherosclerotic change within the left posterior cerebral artery that is unlikely to be clinically significant. 2. There is a normal variant with the left posterior cerebral artery obtaining its flow from the anterior circulation. 3. No aneurysms are seen. 4. No new findings compared to the 11/25/2003 MRA.   MRI orbit wwo contrast 09/09/2015:  This is is a normal MRI of the orbits with and without contrast.  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 She was hospitalized for MG crisis in December 2018, treated with IVIG, did not tolerate prednisone, she is managed with monthly IVIG. Azathioprine was added in February 2019.  Clinically, she doing great on maintenance IVIG every 3 weeks, exam is stable without weakness. We discussed reducing the frequency to every 4 weeks, but given the number of other medical comorbidities of newly diagnosed breast cancer and bilateral knee OA, will keep her on every 3 weeks until after she has underwent surgery as to avoid any potential exacerbation.    - Continue IVIG 1g/kg every 3 weeks.    - Continue azathioprine 18m daily.  Check CBC and CMP in 3 months  - Continue mestinon 641mTID  - Consider [employer], if she develops new exacerbation  She was urged to follow-up with her oncologist who can address her care team concerns/questions  Return to clinic in 3 months  Greater than 50% of this 30 minute visit was spent in counseling, explanation of diagnosis, planning of further management, and coordination of care due to the number of questions and concerns that she had today.   Thank you for allowing me to participate in patient's care.  If I can answer any additional questions, I would be pleased to do so.    Sincerely,    Evola Hollis K. PaPosey ProntoDO

## 2017-09-12 NOTE — Patient Instructions (Signed)
It was great to see you today!  Continue your medications as you are taking them  Return to clinic in 3 months

## 2017-09-13 ENCOUNTER — Encounter: Payer: Self-pay | Admitting: General Practice

## 2017-09-13 NOTE — Progress Notes (Signed)
[place] Spiritual Care Note  LVM of availability and encouragement in response to VM from [place].    [place], [place], [facility] Pager [phone] Voicemail [phone]

## 2017-09-13 NOTE — Progress Notes (Signed)
[place] Spiritual Care Note  Reached Daryn by phone, providing opportunity for her to share and process her latest updates and areas of discernment related to health, tx plan, move from [place] to [place], and other concerns on her mind. She values having a conversation partner and sounding board for this work, and we look forward to meeting in person at my office on 8/1 for further processing.   [place], [place], [facility] Pager [phone] Voicemail [phone]

## 2017-09-16 DIAGNOSIS — M170 Bilateral primary osteoarthritis of knee: Secondary | ICD-10-CM | POA: Diagnosis not present

## 2017-09-25 ENCOUNTER — Inpatient Hospital Stay: Payer: Medicare Other | Attending: Hematology and Oncology | Admitting: Hematology and Oncology

## 2017-09-25 DIAGNOSIS — G7000 Myasthenia gravis without (acute) exacerbation: Secondary | ICD-10-CM | POA: Diagnosis not present

## 2017-09-25 DIAGNOSIS — D0511 Intraductal carcinoma in situ of right breast: Secondary | ICD-10-CM | POA: Diagnosis not present

## 2017-09-25 DIAGNOSIS — Z79899 Other long term (current) drug therapy: Secondary | ICD-10-CM | POA: Diagnosis not present

## 2017-09-25 NOTE — Progress Notes (Signed)
Patient Care Team: Prince Solian, MD as PCP - General (Internal Medicine) Erroll Luna, MD as Consulting Physician (General Surgery) Nicholas Lose, MD as Consulting Physician (Hematology and Oncology) Eppie Gibson, MD as Attending Physician (Radiation Oncology)  DIAGNOSIS:  Encounter Diagnosis  Name Primary?  . Ductal carcinoma in situ (DCIS) of right breast     SUMMARY OF ONCOLOGIC HISTORY:   Ductal carcinoma in situ (DCIS) of right breast   07/19/2017 Initial Diagnosis    Screening detected right breast calcifications and distortion.  The distortion was in the upper outer quadrant: Biopsy DCIS intermediate grade with CSL; calcifications UIQ: 2.8 cm: Biopsy DCIS+ ALH +CSL; 4 cm apart, axilla negative, Tis NX stage 0       CHIEF COMPLIANT: Follow-up of DCIS  INTERVAL HISTORY: Erica Drake is a 82 year old with above-mentioned history of right breast DCIS who put away the decision regarding surgery because she was moving from one nursing home to another.  She is here today to discuss her treatment plan.  She has myasthenia gravis and is very concerned that surgery or anesthesia or radiation can cause her myasthenia to get worse.  She denies any pain or discomfort in the breast. Because of myasthenia gravis she feels that her legs are very heavy.  REVIEW OF SYSTEMS:   Constitutional: Denies fevers, chills or abnormal weight loss Eyes: Denies blurriness of vision Ears, nose, mouth, throat, and face: Denies mucositis or sore throat Respiratory: Denies cough, dyspnea or wheezes Cardiovascular: Denies palpitation, chest discomfort Gastrointestinal:  Denies nausea, heartburn or change in bowel habits Skin: Denies abnormal skin rashes Lymphatics: Denies new lymphadenopathy or easy bruising Neurological:Denies numbness, tingling or new weaknesses Behavioral/Psych: Mood is stable, no new changes  Extremities: No lower extremity edema Breast:  denies any pain or lumps or  nodules in either breasts All other systems were reviewed with the patient and are negative.  I have reviewed the past medical history, past surgical history, social history and family history with the patient and they are unchanged from previous note.  ALLERGIES:  is allergic to cortisone; amlodipine besylate; chlorhexidine gluconate; clindamycin; dexamethasone; duloxetine; morphine sulfate; niacin; other; prednisone; rosuvastatin calcium; statins; sulfa antibiotics; valsartan; and strawberry extract.  MEDICATIONS:  Current Outpatient Medications  Medication Sig Dispense Refill  . albuterol (PROAIR HFA) 108 (90 Base) MCG/ACT inhaler Inhale 1-2 puffs into the lungs every 4 (four) hours as needed for wheezing or shortness of breath.     Marland Kitchen albuterol (PROVENTIL) (2.5 MG/3ML) 0.083% nebulizer solution Take 3 mLs (2.5 mg total) by nebulization every 6 (six) hours as needed for wheezing or shortness of breath. 25 vial 5  . azaTHIOprine (IMURAN) 50 MG tablet Take 2 tablets (100 mg total) by mouth daily. 180 tablet 3  . buPROPion (WELLBUTRIN SR) 200 MG 12 hr tablet Take 200 mg by mouth 2 (two) times daily after a meal.     . clonazePAM (KLONOPIN) 1 MG disintegrating tablet DIS ONE T PO QAM AND 3 TS QHS  5  . conjugated estrogens (PREMARIN) vaginal cream Place vaginally.    . Fluticasone-Umeclidin-Vilant (TRELEGY ELLIPTA) 100-62.5-25 MCG/INH AEPB Inhale 1 puff into the lungs daily. 2 each 0  . GAMUNEX-C 5 GM/50ML SOLN   16  . GLYDO 2 % jelly APPLY TOPICALLY PRN.  2  . lidocaine (XYLOCAINE) 2 % jelly Apply topically.    . meclizine (ANTIVERT) 12.5 MG tablet Take 1 tablet (12.5 mg total) by mouth 3 (three) times daily as needed for dizziness. [place]  tablet 3  . meloxicam (MOBIC) 15 MG tablet   1  . methocarbamol (ROBAXIN) 500 MG tablet Take 1 tablet (500 mg total) by mouth 4 (four) times daily. As needed for muscle spasm 30 tablet 1  . methylcellulose (CITRUCEL) oral powder Take daily by mouth.    Marland Kitchen  omeprazole (PRILOSEC) 20 MG capsule Take 20 mg daily by mouth.    . polyethylene glycol (MIRALAX / GLYCOLAX) packet Take 17 g by mouth.    Marland Kitchen PRESCRIPTION MEDICATION Inhale into the lungs at bedtime. CPAP    . pyridostigmine (MESTINON) 60 MG tablet Take 1 tablet (60 mg total) by mouth 3 (three) times daily. 270 tablet 3  . trimethoprim (TRIMPEX) 100 MG tablet Take 100 mg by mouth daily.  11  . umeclidinium-vilanterol (ANORO ELLIPTA) 62.5-25 MCG/INH AEPB Inhale 1 puff into the lungs daily. 60 each 5   No current facility-administered medications for this visit.     PHYSICAL EXAMINATION: ECOG PERFORMANCE STATUS: 1 - Symptomatic but completely ambulatory  Vitals:   09/25/17 1450  BP: (!) 168/74  Pulse: 99  Resp: 18  Temp: 98.5 F (36.9 C)  SpO2: 96%   Filed Weights   09/25/17 1450  Weight: 146 lb 14.4 oz (66.6 kg)    GENERAL:alert, no distress and comfortable SKIN: skin color, texture, turgor are normal, no rashes or significant lesions EYES: normal, Conjunctiva are pink and non-injected, sclera clear OROPHARYNX:no exudate, no erythema and lips, buccal mucosa, and tongue normal  NECK: supple, thyroid normal size, non-tender, without nodularity LYMPH:  no palpable lymphadenopathy in the cervical, axillary or inguinal LUNGS: clear to auscultation and percussion with normal breathing effort HEART: regular rate & rhythm and no murmurs and no lower extremity edema ABDOMEN:abdomen soft, non-tender and normal bowel sounds MUSCULOSKELETAL:no cyanosis of digits and no clubbing  NEURO: alert & oriented x 3 with fluent speech,  occasional diplopia EXTREMITIES: Heaviness in the legs    LABORATORY DATA:  I have reviewed the data as listed CMP Latest Ref Rng & Units 07/31/2017 06/04/2017 05/09/2017  Glucose 70 - 140 mg/dL 86 100(H) 109(H)  BUN 7 - 26 mg/dL 15 19 12   Creatinine 0.60 - 1.10 mg/dL 1.37(H) 1.27(H) 1.13  Sodium 136 - 145 mmol/L 135(L) 136 135  Potassium 3.5 - 5.1 mmol/L 5.3(H)  4.9 4.3  Chloride 98 - 109 mmol/L 102 104 102  CO2 22 - 29 mmol/L 27 29 31   Calcium 8.4 - 10.4 mg/dL 9.2 9.4 9.3  Total Protein 6.4 - 8.3 g/dL 9.4(H) 8.8(H) 8.3  Total Bilirubin 0.2 - 1.2 mg/dL <0.2(L) 0.5 0.2  Alkaline Phos 40 - 150 U/L 60 53 54  AST 5 - 34 U/L 24 23 17   ALT 0 - 55 U/L 9 11 12     Lab Results  Component Value Date   WBC 4.4 07/31/2017   HGB 12.6 07/31/2017   HCT 36.9 07/31/2017   MCV 97.3 07/31/2017   PLT 355 07/31/2017   NEUTROABS 2.7 07/31/2017    ASSESSMENT & PLAN:  Ductal carcinoma in situ (DCIS) of right breast 07/19/2017 Screening detected right breast calcifications and distortion.  The distortion was in the upper outer quadrant: Biopsy DCIS intermediate grade with CSL; calcifications UIQ: 2.8 cm: Biopsy DCIS+ ALH +CSL; 4 cm apart, axilla negative, Tis NX stage 0  Significant delay in decision making because of psychosocial issues.  We previously discussed a Comet clinical trial as well as standard of care which would be with lumpectomy and radiation followed by  hormonal therapy with tamoxifen. I once again discussed all of these options with her. Patient decided that she wants to undergo lumpectomy.  She does not want to participate in the Comet clinical trial. I will send a message to Dr. Brantley Stage to see her back and consider lumpectomy. We will see her back one week after surgery to go over the pathology report.   No orders of the defined types were placed in this encounter.  The patient has a good understanding of the overall plan. she agrees with it. she will call with any problems that may develop before the next visit here.   Harriette Ohara, MD 09/25/17

## 2017-09-25 NOTE — Assessment & Plan Note (Signed)
07/19/2017 Screening detected right breast calcifications and distortion.  The distortion was in the upper outer quadrant: Biopsy DCIS intermediate grade with CSL; calcifications UIQ: 2.8 cm: Biopsy DCIS+ ALH +CSL; 4 cm apart, axilla negative, Tis NX stage 0  Significant delay in decision making because of psychosocial issues.  We previously discussed a Comet clinical trial as well as standard of care which would be with lumpectomy and radiation followed by hormonal therapy with tamoxifen. I once again discussed all of these options with her.

## 2017-09-26 ENCOUNTER — Telehealth: Payer: Self-pay | Admitting: Hematology and Oncology

## 2017-09-26 NOTE — Telephone Encounter (Signed)
No 7/31 los.  °

## 2017-09-26 NOTE — Telephone Encounter (Signed)
Per 7/31 los.

## 2017-10-03 ENCOUNTER — Telehealth: Payer: Self-pay

## 2017-10-03 ENCOUNTER — Ambulatory Visit: Payer: Self-pay | Admitting: General Surgery

## 2017-10-03 DIAGNOSIS — D0511 Intraductal carcinoma in situ of right breast: Secondary | ICD-10-CM | POA: Diagnosis not present

## 2017-10-03 NOTE — Telephone Encounter (Signed)
Returned patient's voicemail regarding appointments.  No answer.  Contact information left for call back.

## 2017-10-07 ENCOUNTER — Telehealth: Payer: Self-pay | Admitting: Neurology

## 2017-10-07 NOTE — Telephone Encounter (Signed)
Patient called wanting to speak with Caryl Pina about some medical concerns. Please call her back at [phone]. Thanks.

## 2017-10-09 ENCOUNTER — Encounter (INDEPENDENT_AMBULATORY_CARE_PROVIDER_SITE_OTHER): Payer: Self-pay | Admitting: Orthopaedic Surgery

## 2017-10-09 ENCOUNTER — Ambulatory Visit (INDEPENDENT_AMBULATORY_CARE_PROVIDER_SITE_OTHER): Payer: Medicare Other

## 2017-10-09 ENCOUNTER — Ambulatory Visit (INDEPENDENT_AMBULATORY_CARE_PROVIDER_SITE_OTHER): Payer: Medicare Other | Admitting: Orthopaedic Surgery

## 2017-10-09 DIAGNOSIS — M1712 Unilateral primary osteoarthritis, left knee: Secondary | ICD-10-CM

## 2017-10-09 DIAGNOSIS — G8929 Other chronic pain: Secondary | ICD-10-CM | POA: Diagnosis not present

## 2017-10-09 DIAGNOSIS — M25561 Pain in right knee: Secondary | ICD-10-CM

## 2017-10-09 DIAGNOSIS — M1711 Unilateral primary osteoarthritis, right knee: Secondary | ICD-10-CM | POA: Diagnosis not present

## 2017-10-09 DIAGNOSIS — M25562 Pain in left knee: Secondary | ICD-10-CM

## 2017-10-09 MED ORDER — LIDOCAINE HCL 1 % IJ SOLN
3.0000 mL | INTRAMUSCULAR | Status: AC | PRN
  Administered 2017-10-09: 3 mL

## 2017-10-09 MED ORDER — METHYLPREDNISOLONE ACETATE 40 MG/ML IJ SUSP
40.0000 mg | INTRAMUSCULAR | Status: AC | PRN
  Administered 2017-10-09: 40 mg via INTRA_ARTICULAR

## 2017-10-09 NOTE — Progress Notes (Signed)
Office Visit Note   Patient: Erica Drake           Date of Birth: 1935-05-24           MRN: [id number] Visit Date: 10/09/2017              Requested by: Prince Solian, MD [address] [place], [place] [address] PCP: Prince Solian, MD   Assessment & Plan: Visit Diagnoses:  1. Chronic pain of left knee   2. Chronic pain of right knee   3. Unilateral primary osteoarthritis, left knee   4. Unilateral primary osteoarthritis, right knee     Plan: I do feel at this point that she is a candidate for knee replacement surgery.  However her breast cancer issue is more important to deal with for now.  She has not had a steroid injection sometime in either knee and I do recommend that today.  She understands the risk and benefits of these injections.  She is having surgery I believe on her right breast next month.  We will see her back in 6 weeks to see how she is doing overall.  All questions concerns were answered and addressed.  Follow-Up Instructions: Return in about 6 weeks (around 11/20/2017).   Orders:  Orders Placed This Encounter  Procedures  . Large Joint Inj  . Large Joint Inj  . XR Knee 1-2 Views Left  . XR Knee 1-2 Views Right   No orders of the defined types were placed in this encounter.     Procedures: Large Joint Inj: R knee on 10/09/2017 10:01 AM Indications: diagnostic evaluation and pain Details: 22 G 1.5 in needle, superolateral approach  Arthrogram: No  Medications: 3 mL lidocaine 1 %; 40 mg methylPREDNISolone acetate 40 MG/ML Outcome: tolerated well, no immediate complications Procedure, treatment alternatives, risks and benefits explained, specific risks discussed. Consent was given by the patient. Immediately prior to procedure a time out was called to verify the correct patient, procedure, equipment, support staff and site/side marked as required. Patient was prepped and draped in the usual sterile fashion.   Large Joint Inj: L knee on 10/09/2017  10:01 AM Indications: diagnostic evaluation and pain Details: 22 G 1.5 in needle, superolateral approach  Arthrogram: No  Medications: 3 mL lidocaine 1 %; 40 mg methylPREDNISolone acetate 40 MG/ML Outcome: tolerated well, no immediate complications Procedure, treatment alternatives, risks and benefits explained, specific risks discussed. Consent was given by the patient. Immediately prior to procedure a time out was called to verify the correct patient, procedure, equipment, support staff and site/side marked as required. Patient was prepped and draped in the usual sterile fashion.       Clinical Data: No additional findings.   Subjective: Chief Complaint  Patient presents with  . Right Knee - Pain  The patient is a very pleasant 82 year old female who has bilateral knee pain and swelling which is become a constant pain.  She actually had a left knee arthroscopy done 15 months ago by another Psychologist, sport and exercise in town.  She said that did not help at all.  She has had steroid injections and hyaluronic acid injections in her knees.  At this point doing up and down stairs detrimental effect directives daily living, her quality of life, and her mobility.  Her knee pain is severe.  She has known significant arthritis in both her knees.  Unfortunately she is dealing with breast cancer and is having surgery next month on her breast.  HPI  Review  of Systems She currently denies any headache, chest pain, short of breath, fever, chills, nausea, vomiting.  Objective: Vital Signs: There were no vitals taken for this visit.  Physical Exam She is alert and oriented x3 and in no acute distress Ortho Exam Examination of both knees show she lacks full extension by few degrees.  Both knees have a slight effusion.  Both knees have varus malalignment.  Both knees have good range of motion and are ligamentously stable but have global joint line tenderness. Specialty Comments:  No specialty comments  available.  Imaging: Xr Knee 1-2 Views Left  Result Date: 10/09/2017 2 views of the left knee show tricompartmental arthritic changes.  There is medial joint space narrowing with varus malalignment.  There are periarticular osteophytes in all 3 compartments.  Xr Knee 1-2 Views Right  Result Date: 10/09/2017 2 views of the right knee show no acute findings.  There is tricompartmental arthritic changes with varus malalignment.  There is medial joint space narrowing and para-articular osteophytes in all 3 compartments.    PMFS History: Patient Active Problem List   Diagnosis Date Noted  . Chronic pain of left knee 10/09/2017  . Chronic pain of right knee 10/09/2017  . Unilateral primary osteoarthritis, left knee 10/09/2017  . Unilateral primary osteoarthritis, right knee 10/09/2017  . 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 Myasthenia gravis without acute exacerbation ([place]) 07/16/2017  . Acute medial meniscal tear 07/18/2016  . Acute medial meniscal tear, left, subsequent encounter 07/18/2016  . Asthmatic bronchitis, mild persistent, with acute exacerbation 12/29/2015  . Chronic cholecystitis 01/14/2015  . Angioedema 05/23/2014  . INSOMNIA 05/31/2007  . EUSTACHIAN TUBE DYSFUNCTION 04/30/2007  . Seasonal and perennial allergic rhinitis 04/30/2007  . COPD mixed type ([place]) 04/30/2007  . Esophageal reflux 04/30/2007  . Fibromyalgia 04/30/2007   Past Medical History:  Diagnosis Date  . Allergic rhinitis   . Chronic bronchitis    some mild wheezing at change of weather-  . Chronic fatigue   . Complication of anesthesia    pt reports she always has to be admitted after surgery with pulmon. consult  d/t respiratory history  . Decreased hearing    intol hearing aids. Deaf left ear, hearing aid right ear.  . Diabetes mellitus without complication ([place])   . Eustachian tube dysfunction    decreased hearing  . Fibromyalgia   . GERD (gastroesophageal reflux  disease)    [place]  . Hx of myasthenia gravis   . Interstitial cystitis    [facility]-Dr. Karsten Ro sees periodically.(occ. self caths at home)  . Vertigo     Family History  Adopted: Yes  Problem Relation Age of Onset  . Asthma Mother        biologic  . Breast cancer Mother        age 18  . Asthma Son   . Stroke Neg Hx   . Neuropathy Neg Hx   . Multiple sclerosis Neg Hx     Past Surgical History:  Procedure Laterality Date  . BREAST CYST ASPIRATION    . CHOLECYSTECTOMY N/A 01/14/2015   Procedure: LAPAROSCOPIC CHOLECYSTECTOMY WITH INTRAOPERATIVE CHOLANGIOGRAM;  Surgeon: Johnathan Hausen, MD;  Location: [facility];  Service: General;  Laterality: N/A;  . KNEE ARTHROSCOPY WITH MEDIAL MENISECTOMY Left 07/18/2016   Procedure: LEFT KNEE ARTHROSCOPY WITH MEDIAL MENISECTOMY dedribement and chrodralplasty;  Surgeon: Gaynelle Arabian, MD;  Location: [facility];  Service: Orthopedics;  Laterality: Left;  . NISSEN FUNDOPLICATION  tx of GERD   Social History   Occupational History  . Not on file  Tobacco Use  . Smoking status: Former Research scientist (life sciences)  . Smokeless tobacco: Former Systems developer    Quit date: 01/09/1974  Substance and Sexual Activity  . Alcohol use: No  . Drug use: No  . Sexual activity: Not on file

## 2017-10-11 ENCOUNTER — Telehealth: Payer: Self-pay

## 2017-10-11 ENCOUNTER — Telehealth: Payer: Self-pay | Admitting: Hematology and Oncology

## 2017-10-11 NOTE — Telephone Encounter (Signed)
Returned pt's call regarding will she feel well enough for upcoming appt with Dr Lindi Adie after her mastectomy surgery scheduled for 9-17.  Explained to pt that visit will be a week later and she should be getting up and around and should feel well enough to come- but if not - please call our office and we can reschedule for a couple of days from then.  However, I reminded her Dr Lindi Adie was planning to discuss her pathology report from surgery and she voiced understanding and interest in knowing results.  Pt had a few questions regarding recovery and I advised her to call surgeon's office and ask any surgical or recovery questions with them.  Pt voiced understanding. No other needs per pt at this time.

## 2017-10-11 NOTE — Telephone Encounter (Signed)
Patient called to ask about getting a port put in for her infusions.  Informed her that Dr. Posey Pronto does not think that this is a good idea from a neurological stand point but that she may consult her oncologist for their opinion.

## 2017-10-11 NOTE — Telephone Encounter (Signed)
Spoke to patient regarding upcoming sept appts per 8/16 sch message

## 2017-10-14 DIAGNOSIS — R399 Unspecified symptoms and signs involving the genitourinary system: Secondary | ICD-10-CM | POA: Diagnosis not present

## 2017-10-16 DIAGNOSIS — N3010 Interstitial cystitis (chronic) without hematuria: Secondary | ICD-10-CM | POA: Diagnosis not present

## 2017-10-16 DIAGNOSIS — N3941 Urge incontinence: Secondary | ICD-10-CM | POA: Diagnosis not present

## 2017-10-16 DIAGNOSIS — G7000 Myasthenia gravis without (acute) exacerbation: Secondary | ICD-10-CM | POA: Diagnosis not present

## 2017-10-16 DIAGNOSIS — Z87440 Personal history of urinary (tract) infections: Secondary | ICD-10-CM | POA: Diagnosis not present

## 2017-10-16 DIAGNOSIS — N952 Postmenopausal atrophic vaginitis: Secondary | ICD-10-CM | POA: Diagnosis not present

## 2017-10-16 DIAGNOSIS — R399 Unspecified symptoms and signs involving the genitourinary system: Secondary | ICD-10-CM | POA: Diagnosis not present

## 2017-10-17 DIAGNOSIS — C50919 Malignant neoplasm of unspecified site of unspecified female breast: Secondary | ICD-10-CM | POA: Diagnosis not present

## 2017-10-17 DIAGNOSIS — G7000 Myasthenia gravis without (acute) exacerbation: Secondary | ICD-10-CM | POA: Diagnosis not present

## 2017-10-17 DIAGNOSIS — N3011 Interstitial cystitis (chronic) with hematuria: Secondary | ICD-10-CM | POA: Diagnosis not present

## 2017-10-17 DIAGNOSIS — N3010 Interstitial cystitis (chronic) without hematuria: Secondary | ICD-10-CM | POA: Diagnosis not present

## 2017-10-17 DIAGNOSIS — E119 Type 2 diabetes mellitus without complications: Secondary | ICD-10-CM | POA: Diagnosis not present

## 2017-10-17 DIAGNOSIS — K219 Gastro-esophageal reflux disease without esophagitis: Secondary | ICD-10-CM | POA: Diagnosis not present

## 2017-10-17 DIAGNOSIS — N183 Chronic kidney disease, stage 3 (moderate): Secondary | ICD-10-CM | POA: Diagnosis not present

## 2017-10-17 DIAGNOSIS — M797 Fibromyalgia: Secondary | ICD-10-CM | POA: Diagnosis not present

## 2017-10-21 DIAGNOSIS — N952 Postmenopausal atrophic vaginitis: Secondary | ICD-10-CM | POA: Diagnosis not present

## 2017-10-21 DIAGNOSIS — N3020 Other chronic cystitis without hematuria: Secondary | ICD-10-CM | POA: Diagnosis not present

## 2017-10-21 DIAGNOSIS — N281 Cyst of kidney, acquired: Secondary | ICD-10-CM | POA: Diagnosis not present

## 2017-10-21 DIAGNOSIS — N3010 Interstitial cystitis (chronic) without hematuria: Secondary | ICD-10-CM | POA: Diagnosis not present

## 2017-10-24 ENCOUNTER — Telehealth: Payer: Self-pay | Admitting: *Deleted

## 2017-10-24 NOTE — Telephone Encounter (Signed)
Caitlin ([facility] infusion nurse) and I both have spoken to patient and she does not want the port-a-cath at this time.  She said that she would speak with Dr. Excell Seltzer again about this as well.

## 2017-10-24 NOTE — Telephone Encounter (Signed)
-----   Message from Alda Berthold, DO sent at 10/24/2017 12:19 PM EDT ----- Regarding: FW: Port placement [place], please inquire with [facility] see if it would be ok to administer IVIG via port-a-cath and if they can use it.  Thanks, Arvin Collard ----- Message ----- From: Excell Seltzer, MD Sent: 10/24/2017  11:25 AM EDT To: Alda Berthold, DO, Nicholas Lose, MD Subject: Port placement                                 This patient will be undergoing mastectomy.  She has requested that I place a Port-A-Cath at the time of her surgery due to her every 3 week IVIG therapy and difficulty with venous access.  This seems reasonable and I would be happy to do this if you agree.  Please let me know, thanks

## 2017-10-30 ENCOUNTER — Inpatient Hospital Stay: Payer: Medicare Other | Attending: Hematology and Oncology | Admitting: Hematology and Oncology

## 2017-10-30 ENCOUNTER — Encounter: Payer: Self-pay | Admitting: General Practice

## 2017-10-30 ENCOUNTER — Telehealth: Payer: Self-pay | Admitting: Hematology and Oncology

## 2017-10-30 DIAGNOSIS — D0511 Intraductal carcinoma in situ of right breast: Secondary | ICD-10-CM

## 2017-10-30 MED ORDER — TAMOXIFEN CITRATE 10 MG PO TABS: 10.0000 mg | ORAL_TABLET | Freq: Two times a day (BID) | ORAL | 3 refills | Status: DC

## 2017-10-30 NOTE — Progress Notes (Signed)
Patient Care Team: Prince Solian, MD as PCP - General (Internal Medicine) Erroll Luna, MD as Consulting Physician (General Surgery) Nicholas Lose, MD as Consulting Physician (Hematology and Oncology) Eppie Gibson, MD as Attending Physician (Radiation Oncology)  DIAGNOSIS:  Encounter Diagnosis  Name Primary?  . Ductal carcinoma in situ (DCIS) of right breast     SUMMARY OF ONCOLOGIC HISTORY:   Ductal carcinoma in situ (DCIS) of right breast   07/19/2017 Initial Diagnosis    Screening detected right breast calcifications and distortion.  The distortion was in the upper outer quadrant: Biopsy DCIS intermediate grade with CSL; calcifications UIQ: 2.8 cm: Biopsy DCIS+ ALH +CSL; 4 cm apart, axilla negative, Tis NX stage 0     CHIEF COMPLIANT: Follow-up to discuss her treatment plan  INTERVAL HISTORY: Erica Drake is a 82 year old with above-mentioned history of DCIS who has been going back and forth about her treatment options.  Her treatment has been delayed almost 4 months because of her in decision.  Last time we met we talked about lumpectomy and she called me today saying that she has second thoughts about it and that she does not want to undergo surgery.  She was also not interested in participating in Comet clinical trial.  She wanted to be on a active surveillance with tamoxifen therapy.  She is here today to discuss the pros and cons of this approach.  REVIEW OF SYSTEMS:   Constitutional: Denies fevers, chills or abnormal weight loss Eyes: Denies blurriness of vision Ears, nose, mouth, throat, and face: Denies mucositis or sore throat Respiratory: Denies cough, dyspnea or wheezes Cardiovascular: Denies palpitation, chest discomfort Gastrointestinal:  Denies nausea, heartburn or change in bowel habits Skin: Denies abnormal skin rashes Lymphatics: Denies new lymphadenopathy or easy bruising Neurological: Myasthenia gravis Behavioral/Psych: Mood is stable, no new  changes  Extremities: No lower extremity edema Breast:  denies any pain or lumps or nodules in either breasts All other systems were reviewed with the patient and are negative.  I have reviewed the past medical history, past surgical history, social history and family history with the patient and they are unchanged from previous note.  ALLERGIES:  is allergic to cortisone; amlodipine besylate; chlorhexidine gluconate; clindamycin; dexamethasone; duloxetine; morphine sulfate; niacin; other; prednisone; rosuvastatin calcium; statins; sulfa antibiotics; valsartan; and strawberry extract.  MEDICATIONS:  Current Outpatient Medications  Medication Sig Dispense Refill  . dexlansoprazole (DEXILANT) 60 MG capsule Take 60 mg by mouth daily.    Marland Kitchen albuterol (PROAIR HFA) 108 (90 Base) MCG/ACT inhaler Inhale 1-2 puffs into the lungs every 4 (four) hours as needed for wheezing or shortness of breath.     Marland Kitchen albuterol (PROVENTIL) (2.5 MG/3ML) 0.083% nebulizer solution Take 3 mLs (2.5 mg total) by nebulization every 6 (six) hours as needed for wheezing or shortness of breath. 25 vial 5  . azaTHIOprine (IMURAN) 50 MG tablet Take 2 tablets (100 mg total) by mouth daily. 180 tablet 3  . buPROPion (WELLBUTRIN SR) 200 MG 12 hr tablet Take 200 mg by mouth 2 (two) times daily after a meal.     . clonazePAM (KLONOPIN) 1 MG disintegrating tablet DIS ONE T PO QAM AND 3 TS QHS  5  . conjugated estrogens (PREMARIN) vaginal cream Place vaginally.    . Fluticasone-Umeclidin-Vilant (TRELEGY ELLIPTA) 100-62.5-25 MCG/INH AEPB Inhale 1 puff into the lungs daily. 2 each 0  . GAMUNEX-C 5 GM/50ML SOLN   16  . GLYDO 2 % jelly APPLY TOPICALLY PRN.  2  .  lidocaine (XYLOCAINE) 2 % jelly Apply topically.    . meclizine (ANTIVERT) 12.5 MG tablet Take 1 tablet (12.5 mg total) by mouth 3 (three) times daily as needed for dizziness. 30 tablet 3  . meloxicam (MOBIC) 15 MG tablet   1  . methocarbamol (ROBAXIN) 500 MG tablet Take 1 tablet  (500 mg total) by mouth 4 (four) times daily. As needed for muscle spasm 30 tablet 1  . methylcellulose (CITRUCEL) oral powder Take daily by mouth.    . polyethylene glycol (MIRALAX / GLYCOLAX) packet Take 17 g by mouth.    Marland Kitchen PRESCRIPTION MEDICATION Inhale into the lungs at bedtime. CPAP    . pyridostigmine (MESTINON) 60 MG tablet Take 1 tablet (60 mg total) by mouth 3 (three) times daily. 270 tablet 3  . tamoxifen (NOLVADEX) 10 MG tablet Take 1 tablet (10 mg total) by mouth 2 (two) times daily. 90 tablet 3  . trimethoprim (TRIMPEX) 100 MG tablet Take 100 mg by mouth daily.  11  . umeclidinium-vilanterol (ANORO ELLIPTA) 62.5-25 MCG/INH AEPB Inhale 1 puff into the lungs daily. 60 each 5   No current facility-administered medications for this visit.     PHYSICAL EXAMINATION: ECOG PERFORMANCE STATUS: 1 - Symptomatic but completely ambulatory  Vitals:   10/30/17 1226  BP: (!) 173/76  Pulse: 80  Resp: 18  Temp: 97.6 F (36.4 C)  SpO2: 97%   Filed Weights   10/30/17 1226  Weight: 144 lb 8 oz (65.5 kg)    GENERAL:alert, no distress and comfortable SKIN: skin color, texture, turgor are normal, no rashes or significant lesions EYES: normal, Conjunctiva are pink and non-injected, sclera clear OROPHARYNX:no exudate, no erythema and lips, buccal mucosa, and tongue normal  NECK: supple, thyroid normal size, non-tender, without nodularity LYMPH:  no palpable lymphadenopathy in the cervical, axillary or inguinal LUNGS: clear to auscultation and percussion with normal breathing effort HEART: regular rate & rhythm and no murmurs and no lower extremity edema ABDOMEN:abdomen soft, non-tender and normal bowel sounds MUSCULOSKELETAL:no cyanosis of digits and no clubbing  NEURO: Myasthenia gravis EXTREMITIES: No lower extremity edema   LABORATORY DATA:  I have reviewed the data as listed CMP Latest Ref Rng & Units 07/31/2017 06/04/2017 05/09/2017  Glucose 70 - 140 mg/dL 86 100(H) 109(H)  BUN 7 -  26 mg/dL 15 19 12   Creatinine 0.60 - 1.10 mg/dL 1.37(H) 1.27(H) 1.13  Sodium 136 - 145 mmol/L 135(L) 136 135  Potassium 3.5 - 5.1 mmol/L 5.3(H) 4.9 4.3  Chloride 98 - 109 mmol/L 102 104 102  CO2 22 - 29 mmol/L 27 29 31   Calcium 8.4 - 10.4 mg/dL 9.2 9.4 9.3  Total Protein 6.4 - 8.3 g/dL 9.4(H) 8.8(H) 8.3  Total Bilirubin 0.2 - 1.2 mg/dL <0.2(L) 0.5 0.2  Alkaline Phos 40 - 150 U/L 60 53 54  AST 5 - 34 U/L 24 23 17   ALT 0 - 55 U/L 9 11 12     Lab Results  Component Value Date   WBC 4.4 07/31/2017   HGB 12.6 07/31/2017   HCT 36.9 07/31/2017   MCV 97.3 07/31/2017   PLT 355 07/31/2017   NEUTROABS 2.7 07/31/2017    ASSESSMENT & PLAN:  Ductal carcinoma in situ (DCIS) of right breast 5/24/2019Screening detected right breast calcifications and distortion. The distortion was in the upper outer quadrant: Biopsy DCIS intermediate grade with CSL; calcifications UIQ: 2.8 cm: Biopsy DCIS+ ALH +CSL; 4 cm apart, axilla negative, Tis NX stage 0  Significant delay in decision  making because of psychosocial issues.  Patient was scheduled to undergo surgery with Dr. Excell Seltzer however she had second thoughts about it and wanted to see [place] to discuss this further. We discussed at length about different treatment options. She does not want to participate in Comet clinical trial. She wishes to go on active surveillance with tamoxifen therapy. After recognizing the tamoxifen can have additional side effects, she wanted to start at 10 mg daily on tamoxifen. I will see her back in 3 months to see how she is doing.  We will plan to obtain another mammogram in 6 months and follow-up. I sent a message to Dr. Excell Seltzer regarding her treatment plan change.   Orders Placed This Encounter  Procedures  . MM DIAG BREAST TOMO UNI RIGHT    Standing Status:   Future    Standing Expiration Date:   10/31/2018    Order Specific Question:   Reason for Exam (SYMPTOM  OR DIAGNOSIS REQUIRED)    Answer:   DCIS on tamoxifen  active surveillance    Order Specific Question:   Preferred imaging location?    Answer:   [facility]   The patient has a good understanding of the overall plan. she agrees with it. she will call with any problems that may develop before the next visit here.   Harriette Ohara, MD 10/30/17

## 2017-10-30 NOTE — Progress Notes (Signed)
[facility]ual [facility] Note  Received VM from [facility] requesting assistance with discernment. LVM of encouragement and availability.   [place], [place], [facility] Pager [phone] Voicemail [phone]

## 2017-10-30 NOTE — Assessment & Plan Note (Signed)
5/24/2019Screening detected right breast calcifications and distortion. The distortion was in the upper outer quadrant: Biopsy DCIS intermediate grade with CSL; calcifications UIQ: 2.8 cm: Biopsy DCIS+ ALH +CSL; 4 cm apart, axilla negative, Tis NX stage 0  Significant delay in decision making because of psychosocial issues.  Patient was scheduled to undergo surgery with Dr. Brantley Stage however she had second thoughts about it and wanted to see [place] to discuss this further.  Magda Paganini is

## 2017-10-30 NOTE — Telephone Encounter (Signed)
Gave patient avs and calendar.  Sent Dr. Lindi Adie a msg re whether patient still needed 9/24 appt.

## 2017-11-07 ENCOUNTER — Inpatient Hospital Stay (HOSPITAL_COMMUNITY): Admission: RE | Admit: 2017-11-07 | Payer: Medicare Other | Source: Ambulatory Visit

## 2017-11-11 ENCOUNTER — Ambulatory Visit (INDEPENDENT_AMBULATORY_CARE_PROVIDER_SITE_OTHER): Payer: Medicare Other

## 2017-11-11 ENCOUNTER — Encounter (INDEPENDENT_AMBULATORY_CARE_PROVIDER_SITE_OTHER): Payer: Self-pay | Admitting: Orthopaedic Surgery

## 2017-11-11 ENCOUNTER — Ambulatory Visit (INDEPENDENT_AMBULATORY_CARE_PROVIDER_SITE_OTHER): Payer: Medicare Other | Admitting: Orthopaedic Surgery

## 2017-11-11 DIAGNOSIS — R1032 Left lower quadrant pain: Secondary | ICD-10-CM

## 2017-11-11 DIAGNOSIS — M5489 Other dorsalgia: Secondary | ICD-10-CM | POA: Diagnosis not present

## 2017-11-11 DIAGNOSIS — Z23 Encounter for immunization: Secondary | ICD-10-CM | POA: Diagnosis not present

## 2017-11-11 MED ORDER — METHOCARBAMOL 500 MG PO TABS: 500.0000 mg | ORAL_TABLET | Freq: Four times a day (QID) | ORAL | 1 refills | Status: DC

## 2017-11-11 MED ORDER — MELOXICAM 15 MG PO TABS: 15.0000 mg | ORAL_TABLET | Freq: Every day | ORAL | 2 refills | Status: DC | PRN

## 2017-11-11 NOTE — Progress Notes (Signed)
Office Visit Note   Patient: Erica Drake           Date of Birth: 10/08/1935           MRN: [id number] Visit Date: 11/11/2017              Requested by: Prince Solian, MD [address] [place], [place] [address] PCP: Prince Solian, MD   Assessment & Plan: Visit Diagnoses:  1. Left groin pain   2. Back pain without sciatica     Plan: This certainly may be an acute flareup of pain from a chronic pain issue.  I showed her hip x-rays and gave her reassurance that she is not any type of hip replacement at all.  We will try just a daily meloxicam as well as refill her Robaxin.  There is really nothing else that I recommend from a orthopedic standpoint for now.  Should continue back brace.  All question concerns were answered and addressed.  Follow-up will be as needed.  Follow-Up Instructions: Return if symptoms worsen or fail to improve.   Orders:  Orders Placed This Encounter  Procedures  . XR HIP UNILAT W OR W/O PELVIS 1V LEFT  . XR Lumbar Spine 2-3 Views   Meds ordered this encounter  Medications  . methocarbamol (ROBAXIN) 500 MG tablet    Sig: Take 1 tablet (500 mg total) by mouth 4 (four) times daily. As needed for muscle spasm    Dispense:  30 tablet    Refill:  1  . meloxicam (MOBIC) 15 MG tablet    Sig: Take 1 tablet (15 mg total) by mouth daily as needed for pain.    Dispense:  30 tablet    Refill:  2      Procedures: No procedures performed   Clinical Data: No additional findings.   Subjective: Chief Complaint  Patient presents with  . Left Leg - Pain  . Left Hip - Pain  Patient comes in with a 2-week history of bilateral leg pain and groin pain.  She has been painful walking and turning certain ways.  She feels like it may be coming from her lumbar spine.  She does wear chronic back brace.  She does have a history of myasthenia gravis.  I have recently placed steroid injections in her knees and she said that helped greatly.  She denies any  sciatic symptoms.  She denies any weakness today in her legs.  This is only on the short amount of time.  She does have a history of breast cancer and was being considered for mastectomy this week.  HPI  Review of Systems She currently denies any headache, chest pain, shortness of breath, fever, chills, nausea, vomiting.  Objective: Vital Signs: There were no vitals taken for this visit.  Physical Exam She is alert and oriented x3 and in no acute distress Ortho Exam Any attempts of putting her hip the range of motion cause severe pain out of portion of exam but there is no blocks to rotation at all.  There is no pain of the trochanteric area on either hip. Specialty Comments:  No specialty comments available.  Imaging: Xr Hip Unilat W Or W/o Pelvis 1v Left  Result Date: 11/11/2017 An AP pelvis and lateral of left hip shows no acute findings.  The joint space is well-maintained.  Xr Lumbar Spine 2-3 Views  Result Date: 11/11/2017 2 views of the lumbar spine show no acute findings.    PMFS History:  Patient Active Problem List   Diagnosis Date Noted  . Chronic pain of left knee 10/09/2017  . Chronic pain of right knee 10/09/2017  . Unilateral primary osteoarthritis, left knee 10/09/2017  . Unilateral primary osteoarthritis, right knee 10/09/2017  . 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 Myasthenia gravis without acute exacerbation ([place]) 07/16/2017  . Acute medial meniscal tear 07/18/2016  . Acute medial meniscal tear, left, subsequent encounter 07/18/2016  . Asthmatic bronchitis, mild persistent, with acute exacerbation 12/29/2015  . Chronic cholecystitis 01/14/2015  . Angioedema 05/23/2014  . INSOMNIA 05/31/2007  . EUSTACHIAN TUBE DYSFUNCTION 04/30/2007  . Seasonal and perennial allergic rhinitis 04/30/2007  . COPD mixed type ([place]) 04/30/2007  . Esophageal reflux 04/30/2007  . Fibromyalgia 04/30/2007   Past Medical History:  Diagnosis Date  .  Allergic rhinitis   . Chronic bronchitis    some mild wheezing at change of weather-  . Chronic fatigue   . Complication of anesthesia    pt reports she always has to be admitted after surgery with pulmon. consult  d/t respiratory history  . Decreased hearing    intol hearing aids. Deaf left ear, hearing aid right ear.  . Diabetes mellitus without complication ([place])   . Eustachian tube dysfunction    decreased hearing  . Fibromyalgia   . GERD (gastroesophageal reflux disease)    [place]  . Hx of myasthenia gravis   . Interstitial cystitis    [facility]-Dr. Karsten Ro sees periodically.(occ. self caths at home)  . Vertigo     Family History  Adopted: Yes  Problem Relation Age of Onset  . Asthma Mother        biologic  . Breast cancer Mother        age 23  . Asthma Son   . Stroke Neg Hx   . Neuropathy Neg Hx   . Multiple sclerosis Neg Hx     Past Surgical History:  Procedure Laterality Date  . BREAST CYST ASPIRATION    . CHOLECYSTECTOMY N/A 01/14/2015   Procedure: LAPAROSCOPIC CHOLECYSTECTOMY WITH INTRAOPERATIVE CHOLANGIOGRAM;  Surgeon: Johnathan Hausen, MD;  Location: [facility];  Service: General;  Laterality: N/A;  . KNEE ARTHROSCOPY WITH MEDIAL MENISECTOMY Left 07/18/2016   Procedure: LEFT KNEE ARTHROSCOPY WITH MEDIAL MENISECTOMY dedribement and chrodralplasty;  Surgeon: Gaynelle Arabian, MD;  Location: [facility];  Service: Orthopedics;  Laterality: Left;  . NISSEN FUNDOPLICATION     tx of GERD   Social History   Occupational History  . Not on file  Tobacco Use  . Smoking status: Former Research scientist (life sciences)  . Smokeless tobacco: Former Systems developer    Quit date: 01/09/1974  Substance and Sexual Activity  . Alcohol use: No  . Drug use: No  . Sexual activity: Not on file

## 2017-11-12 ENCOUNTER — Ambulatory Visit (HOSPITAL_COMMUNITY): Admission: RE | Admit: 2017-11-12 | Payer: Medicare Other | Source: Ambulatory Visit | Admitting: General Surgery

## 2017-11-12 ENCOUNTER — Encounter (HOSPITAL_COMMUNITY): Admission: RE | Payer: Self-pay | Source: Ambulatory Visit

## 2017-11-12 SURGERY — MASTECTOMY, SIMPLE
Anesthesia: General | Site: Breast | Laterality: Right

## 2017-11-19 ENCOUNTER — Inpatient Hospital Stay: Payer: Medicare Other | Admitting: Hematology and Oncology

## 2017-11-19 NOTE — Assessment & Plan Note (Deleted)
5/24/2019Screening detected right breast calcifications and distortion. The distortion was in the upper outer quadrant: Biopsy DCIS intermediate grade with CSL; calcifications UIQ: 2.8 cm: Biopsy DCIS+ ALH +CSL; 4 cm apart, axilla negative, Tis NX stage 0  Significant delay in decision making because of psychosocial issues.  Current treatment: Active surveillance without undergoing surgery.  Tamoxifen 10 mg daily started 10/30/2017  Tamoxifen toxicities:

## 2017-11-20 ENCOUNTER — Ambulatory Visit (INDEPENDENT_AMBULATORY_CARE_PROVIDER_SITE_OTHER): Payer: Self-pay | Admitting: Orthopaedic Surgery

## 2017-11-20 DIAGNOSIS — F341 Dysthymic disorder: Secondary | ICD-10-CM | POA: Diagnosis not present

## 2017-11-26 ENCOUNTER — Telehealth: Payer: Self-pay

## 2017-11-26 NOTE — Telephone Encounter (Signed)
Returned patient's call.  No answer, left message with contact information.

## 2017-12-06 ENCOUNTER — Encounter: Payer: Self-pay | Admitting: Internal Medicine

## 2017-12-09 DIAGNOSIS — F341 Dysthymic disorder: Secondary | ICD-10-CM | POA: Diagnosis not present

## 2017-12-11 ENCOUNTER — Encounter: Payer: Self-pay | Admitting: Internal Medicine

## 2017-12-11 ENCOUNTER — Encounter (INDEPENDENT_AMBULATORY_CARE_PROVIDER_SITE_OTHER): Payer: Self-pay | Admitting: Orthopaedic Surgery

## 2017-12-11 ENCOUNTER — Non-Acute Institutional Stay: Payer: Medicare Other | Admitting: Internal Medicine

## 2017-12-11 ENCOUNTER — Ambulatory Visit (INDEPENDENT_AMBULATORY_CARE_PROVIDER_SITE_OTHER): Payer: Medicare Other | Admitting: Orthopaedic Surgery

## 2017-12-11 VITALS — BP 130/80 | HR 84 | Temp 98.5°F | Ht 65.0 in | Wt 143.0 lb

## 2017-12-11 DIAGNOSIS — Z7189 Other specified counseling: Secondary | ICD-10-CM

## 2017-12-11 DIAGNOSIS — R1032 Left lower quadrant pain: Secondary | ICD-10-CM | POA: Diagnosis not present

## 2017-12-11 DIAGNOSIS — F325 Major depressive disorder, single episode, in full remission: Secondary | ICD-10-CM

## 2017-12-11 DIAGNOSIS — J302 Other seasonal allergic rhinitis: Secondary | ICD-10-CM

## 2017-12-11 DIAGNOSIS — J3089 Other allergic rhinitis: Secondary | ICD-10-CM | POA: Diagnosis not present

## 2017-12-11 DIAGNOSIS — M25552 Pain in left hip: Secondary | ICD-10-CM

## 2017-12-11 DIAGNOSIS — K219 Gastro-esophageal reflux disease without esophagitis: Secondary | ICD-10-CM

## 2017-12-11 DIAGNOSIS — J449 Chronic obstructive pulmonary disease, unspecified: Secondary | ICD-10-CM

## 2017-12-11 DIAGNOSIS — E119 Type 2 diabetes mellitus without complications: Secondary | ICD-10-CM | POA: Diagnosis not present

## 2017-12-11 DIAGNOSIS — M1711 Unilateral primary osteoarthritis, right knee: Secondary | ICD-10-CM | POA: Diagnosis not present

## 2017-12-11 DIAGNOSIS — M1712 Unilateral primary osteoarthritis, left knee: Secondary | ICD-10-CM

## 2017-12-11 DIAGNOSIS — M797 Fibromyalgia: Secondary | ICD-10-CM | POA: Diagnosis not present

## 2017-12-11 DIAGNOSIS — D0511 Intraductal carcinoma in situ of right breast: Secondary | ICD-10-CM

## 2017-12-11 DIAGNOSIS — G7000 Myasthenia gravis without (acute) exacerbation: Secondary | ICD-10-CM

## 2017-12-11 MED ORDER — LIDOCAINE HCL 1 % IJ SOLN
3.0000 mL | INTRAMUSCULAR | Status: AC | PRN
  Administered 2017-12-11: 3 mL

## 2017-12-11 MED ORDER — METHYLPREDNISOLONE ACETATE 40 MG/ML IJ SUSP
40.0000 mg | INTRAMUSCULAR | Status: AC | PRN
  Administered 2017-12-11: 40 mg via INTRA_ARTICULAR

## 2017-12-11 NOTE — Progress Notes (Signed)
Provider:  Rexene Edison. Mariea Clonts, D.O., C.M.D. Location:  Occupational psychologist of Service:  Clinic (12)  Previous PCP: Prince Solian, MD Patient Care Team: Prince Solian, MD as PCP - General (Internal Medicine) Erroll Luna, MD as Consulting Physician (General Surgery) Nicholas Lose, MD as Consulting Physician (Hematology and Oncology) Eppie Gibson, MD as Attending Physician (Radiation Oncology)  Extended Emergency Contact Information Primary Emergency Contact: [place] [address] [place] Phone: [phone] Mobile Phone: [phone] Relation: Daughter  Code Status: DNR completed with patient today at her request Goals of Care: Advanced Directive information Advanced Directives 12/08/2016  Does Patient Have a Medical Advance Directive? Yes  Type of Advance Directive [place]  Does patient want to make changes to medical advance directive? No - Patient declined  Copy of [place] in Chart? No - copy requested  Would patient like information on creating a medical advance directive? -      Chief Complaint  Patient presents with  . Establish Care    new patient    HPI: Patient is a 82 y.o. female seen today to establish with [facility].  Records will be requested from Dr. Laurann Montana, then Dr. Dagmar Hait at [facility].  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  Says she has had her pneumonia vaccines and got a tetanus booster after a fall over the concrete things that block your car from going forward in the parking lot.    Breast cancer:  In may, her mammogram screening detected right breast calcifications and distortion in the upper outer quadrant.  Biopsy showed DCIS.  Treatment got  delayed by her indecision per Dr. Geralyn Flash notes.  She opted not to have a mastectomy, not to participate in comet clinical trial and is on active surveillance with tamoxifen therapy 10mg  beginning last month (10/30/17).  Tamoxifen made her feel so bad, she decided to quit it.  She's doing nothing for the breast cancer now.  She said "to heck with it".  She has a meeting with Dr. Lindi Adie again.  [place] moving [place] apt to [employer] b/c she has a dog.  There is a plan for mammograms every 6 mos. Chance of spread was 2%.  She opted not to have the mastectomy b/c of her IV Ig infusions.  She could not get her myasthenia treatments anymore.  She did not want ports.  Says she'll be 82 11/2.  Is clear she does not want to live to have dementia.  She has a health care directive that indicates that she does not want anything done to prolong her life if she has terminal illnesses.    She was also seen by ortho last month for left groin pain.  xrays were negative of her back and hip.  She wears a back brace.  She goes back to Dr. Ninfa Linden this afternoon--has had cortisone shots in the knees for OA.  Pain is back partially.    She is on gamunex for her myasthenia gravis.  She takes mestinon.  She was diagnosed initially 4 years ago--had diplopia.  Dr. Ernesto Rutherford at [facility] and [facility] sent her to neurology and gave her the diagnosis.  Had another episode later on where she could not hold  her head up.  Now it's in her legs--feels like she has coke bottles full of sand taped to her legs.  She could not take the prednisone for her.    She had a CPAP but returned it--she could not wear it.    She's on anoro instead of trelegy for COPD.  Has albuterol nebs and inhaler.  Uses during allergy season primarily.  Pollen bothers her.  Sees [facility] Amalia Hailey at [facility] for her interstitial cystitis.  Had several trials for this.   Dr. Blossom Hoops repaired her rectocele.  She's had a urethral caruncle.    She's been  seeing Dr. Casimiro Needle for 10-12 years.  First went 10 years ago when her husband was ill and she was his primary caregiver.  She's continued to see him.  On wellbutrin and klonopin.  She notes depression runs in her family---her daughter and grandchildren take wellbutrin also.  She was adopted.  Her birth mother did have breast cancer, thyroidectomy.    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  This happened at 82 yo.  She sometimes cannot walk a straight line.  Happens since then once in a while.  If it gets bad, she chucks down the meclizine.  Had inner ear issues.  Her husband had chf and she cooks accordingly.  She says she gets diarrhea all the time--thinks the food is spicy and greasy.  She is going to talk to the dietitian about it.   Sees Dr. Oletta Lamas, GI.    She has developed diabetes at some point.  Thinks Dr. Laurann Montana diagnosed it.  She had switched to Dr. Dagmar Hait b/c her husband was also under his care.    Past Medical History:  Diagnosis Date  . Allergic rhinitis   . Arthritis   . Breast cancer ([place])   . Chronic bronchitis    some mild wheezing at change of weather-  . Chronic fatigue   . Complication of anesthesia    pt reports she always has to be admitted after surgery with pulmon. consult  d/t respiratory history  . COPD mixed type ([place])   . Decreased hearing    intol hearing aids. Deaf left ear, hearing aid right ear.  . Diabetes mellitus without complication ([place])   . Eustachian tube dysfunction    decreased hearing  . Fibromyalgia   . GERD (gastroesophageal reflux disease)    Nissen  . Hx of myasthenia gravis   . Insomnia   . Interstitial cystitis    [facility]-Dr. Karsten Ro sees periodically.(occ. self caths at home)  . Renal cyst   . Vertigo    Past Surgical History:  Procedure Laterality Date  . BREAST CYST ASPIRATION    . CHOLECYSTECTOMY N/A 01/14/2015   Procedure: LAPAROSCOPIC CHOLECYSTECTOMY WITH INTRAOPERATIVE  CHOLANGIOGRAM;  Surgeon: Johnathan Hausen, MD;  Location: [facility];  Service: General;  Laterality: N/A;  . KNEE ARTHROSCOPY WITH MEDIAL MENISECTOMY Left 07/18/2016   Procedure: LEFT KNEE ARTHROSCOPY WITH MEDIAL MENISECTOMY dedribement and chrodralplasty;  Surgeon: Gaynelle Arabian, MD;  Location: [facility];  Service: Orthopedics;  Laterality: Left;  . NISSEN FUNDOPLICATION     tx of GERD    Social History   Socioeconomic History  . Marital status: Widowed    Spouse name: Not on file  . Number of children: 2  . Years of education: Not on file  . Highest education level: Not on file  Occupational History  . Not on  file  Social Needs  . Financial resource strain: Not on file  . Food insecurity:    Worry: Not on file    Inability: Not on file  . Transportation needs:    Medical: Not on file    Non-medical: Not on file  Tobacco Use  . Smoking status: Former Research scientist (life sciences)  . Smokeless tobacco: Former Systems developer    Quit date: 01/09/1974  Substance and Sexual Activity  . Alcohol use: No  . Drug use: No  . Sexual activity: Not on file  Lifestyle  . Physical activity:    Days per week: Not on file    Minutes per session: Not on file  . Stress: Not on file  Relationships  . Social connections:    Talks on phone: Not on file    Gets together: Not on file    Attends religious service: Not on file    Active member of club or organization: Not on file    Attends meetings of clubs or organizations: Not on file    Relationship status: Not on file  Other Topics Concern  . Not on file  Social History Narrative   Pt was adopted.  Lives alone in a one story home.  Has 2 children.  Retired from Veterinary surgeon work.     Education: some college.         Tobacco use, amount per day now: N/A   Past tobacco use, amount per day: COLLEGE   How many years did you use tobacco: 4    Alcohol use (drinks per week): N/A   Diet:   Do you drink/eat things with caffeine: SODAS   Marital status:     WIDOW                             What year were you married? 1960   Do you live in a house, apartment, assisted living, condo, trailer, etc.? 1 STORY   Is it one or more stories? 1    How many persons live in your home? 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       NO                           Type and how often?   Do you have a living will? YES   Do you have a DNR form?       YES                            If not, do you want to discuss one?   Do you have signed POA/HPOA forms?    YES                    If so, please bring to you appointment       reports that she has quit smoking. She quit smokeless tobacco use about 43 years ago. She reports that she does not drink alcohol or use drugs.  Functional Status Survey:  independent  Family History  Adopted: Yes  Problem Relation Age of Onset  . Asthma Mother        biologic  . Breast cancer Mother  age 37  . Asthma Son   . Stroke Neg Hx   . Neuropathy Neg Hx   . Multiple sclerosis Neg Hx     Health Maintenance  Topic Date Due  . TETANUS/TDAP  12/28/1956  . DEXA SCAN  12/29/2002  . PNA vac Low Risk Adult (1 of 2 - PCV13) 12/29/2002  . MAMMOGRAM  07/03/2018  . INFLUENZA VACCINE  Completed    Allergies  Allergen Reactions  . Cortisone Shortness Of Breath, Swelling and Other (See Comments)    Tongue swelling   . Amlodipine Besylate Cough  . Chlorhexidine Gluconate Itching    Skin on face turned red   . Clindamycin Itching  . Dexamethasone Other (See Comments)    Per MD  . Duloxetine Other (See Comments)    Unknown reaction  . Morphine Sulfate Other (See Comments)    Unknown reaction  . Niacin Other (See Comments)  . Other Other (See Comments)    Pollen - nasal reaction  . Prednisone Other (See Comments)    Causes stomach pain. Sores in her mouth.   . Rosuvastatin Calcium Other (See Comments)    Muscle aches    . Statins Other (See Comments)    Muscle aches  . Sulfa Antibiotics Other (See Comments)    May possibly have caused deafness in one ear  . Valsartan Other (See Comments)    Unknown reaction  . Strawberry Extract Itching and Rash    Outpatient Encounter Medications as of 12/11/2017  Medication Sig  . albuterol (PROAIR HFA) 108 (90 Base) MCG/ACT inhaler Inhale 1-2 puffs into the lungs every 4 (four) hours as needed for wheezing or shortness of breath.   Marland Kitchen albuterol (PROVENTIL) (2.5 MG/3ML) 0.083% nebulizer solution Take 3 mLs (2.5 mg total) by nebulization every 6 (six) hours as needed for wheezing or shortness of breath.  Marland Kitchen buPROPion (WELLBUTRIN SR) 200 MG 12 hr tablet Take 200 mg by mouth 2 (two) times daily after a meal.   . clonazePAM (KLONOPIN) 1 MG disintegrating tablet Take 1 mg by mouth 4 (four) times daily.  Marland Kitchen dexlansoprazole (DEXILANT) 60 MG capsule Take 60 mg by mouth daily.  Marland Kitchen GAMUNEX-C 5 GM/50ML SOLN   . meclizine (ANTIVERT) 12.5 MG tablet Take 1 tablet (12.5 mg total) by mouth 3 (three) times daily as needed for dizziness.  . methocarbamol (ROBAXIN) 500 MG tablet Take 1 tablet (500 mg total) by mouth 4 (four) times daily. As needed for muscle spasm  . methylcellulose (CITRUCEL) oral powder Take daily by mouth.  . polyethylene glycol (MIRALAX / GLYCOLAX) packet Take 17 g by mouth.  . pyridostigmine (MESTINON) 60 MG tablet Take 1 tablet (60 mg total) by mouth 3 (three) times daily.  . tamoxifen (NOLVADEX) 10 MG tablet Take 1 tablet (10 mg total) by mouth 2 (two) times daily.  Marland Kitchen trimethoprim (TRIMPEX) 100 MG tablet Take 100 mg by mouth daily.  Marland Kitchen umeclidinium-vilanterol (ANORO ELLIPTA) 62.5-25 MCG/INH AEPB Inhale 1 puff into the lungs daily.  . [DISCONTINUED] azaTHIOprine (IMURAN) 50 MG tablet Take 2 tablets (100 mg total) by mouth daily.  . [DISCONTINUED] clonazePAM (KLONOPIN) 1 MG disintegrating tablet DIS ONE T PO QAM AND 3 TS QHS  . [DISCONTINUED] conjugated estrogens  (PREMARIN) vaginal cream Place vaginally.  . [DISCONTINUED] Fluticasone-Umeclidin-Vilant (TRELEGY ELLIPTA) 100-62.5-25 MCG/INH AEPB Inhale 1 puff into the lungs daily.  . [DISCONTINUED] GLYDO 2 % jelly APPLY TOPICALLY PRN.  . [DISCONTINUED] lidocaine (XYLOCAINE) 2 % jelly Apply topically.  . [DISCONTINUED] meloxicam (  MOBIC) 15 MG tablet Take 1 tablet (15 mg total) by mouth daily as needed for pain.  . [DISCONTINUED] PRESCRIPTION MEDICATION Inhale into the lungs at bedtime. CPAP   No facility-administered encounter medications on file as of 12/11/2017.     Review of Systems  Constitutional: Negative for chills, fever and malaise/fatigue.  HENT: Positive for congestion and hearing loss.   Eyes:       Diplopia with myasthenia gravis  Respiratory: Positive for cough. Negative for shortness of breath.        COPD, asthmatic bronchitis  Cardiovascular: Negative for chest pain, palpitations and leg swelling.  Gastrointestinal: Positive for diarrhea and heartburn. Negative for abdominal pain, blood in stool, constipation, melena, nausea and vomiting.  Genitourinary:       Previously had interstitial cystitis--sounds like it's in remission; has rectocele  Musculoskeletal: Positive for falls and joint pain.       Left groin pain  Skin: Negative for itching and rash.  Neurological: Positive for weakness.  Endo/Heme/Allergies: Positive for environmental allergies.       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 Negative for memory loss. The patient is not nervous/anxious and does not have insomnia.        On medication and sees Dr. Casimiro Needle for years    Vitals:   12/11/17 0946  BP: 130/80  Pulse: 84  Temp: 98.5 F (36.9 C)  TempSrc: Oral  SpO2: 96%  Weight: 143 lb (64.9 kg)  Height: 5\' 5"  (1.651 m)   Body mass index is 23.8 kg/m. Physical Exam  Constitutional: She is oriented to person, place, and time. She appears well-developed and  well-nourished. No distress.  Appears younger than stated age  HENT:  Head: Normocephalic and atraumatic.  Right Ear: External ear normal.  Left Ear: External ear normal.  Nose: Nose normal.  Mouth/Throat: Oropharynx is clear and moist. No oropharyngeal exudate.  Right hearing aid, deaf in left ear; reports extensive prior dental work  Eyes: Pupils are equal, round, and reactive to light. Conjunctivae and EOM are normal.  Neck: Normal range of motion. Neck supple. No JVD present. No thyromegaly present.  Cardiovascular: Normal rate, regular rhythm, normal heart sounds and intact distal pulses.  Pulmonary/Chest: Effort normal and breath sounds normal. No respiratory distress.  Abdominal: Soft. Bowel sounds are normal. She exhibits no distension and no mass. There is no tenderness. There is no rebound and no guarding.  Musculoskeletal:  Antalgic gait with left groin pain, knees with crepitus  Lymphadenopathy:    She has no cervical adenopathy.  Neurological: She is alert and oriented to person, place, and time. No cranial nerve deficit. She exhibits abnormal muscle tone.  Skin: Skin is warm and dry. Capillary refill takes less than 2 seconds.  Psychiatric: She has a normal mood and affect.    Labs reviewed: Basic Metabolic Panel: Recent Labs    05/09/17 1558 06/04/17 1550 07/31/17 1209  NA 135 136 135*  K 4.3 4.9 5.3*  CL 102 104 102  CO2 31 29 27   GLUCOSE 109* 100* 86  BUN 12 19 15   CREATININE 1.13 1.27* 1.37*  CALCIUM 9.3 9.4 9.2   Liver Function Tests: Recent Labs    05/09/17 1558 06/04/17 1550 07/31/17 1209  AST 17 23 24   ALT 12 11 9   ALKPHOS 54 53 60  BILITOT 0.2 0.5 <0.2*  PROT 8.3 8.8* 9.4*  ALBUMIN 3.3* 3.9 3.3*   No results for input(s): LIPASE, AMYLASE in  the last 8760 hours. No results for input(s): AMMONIA in the last 8760 hours. CBC: Recent Labs    05/09/17 1558 06/04/17 1550 07/31/17 1209  WBC 5.0 6.5 4.4  NEUTROABS 3.3 4.2 2.7  HGB 12.8 12.6  12.6  HCT 36.5 35.9* 36.9  MCV 95.2 93.1 97.3  PLT 308.0 471.0* 355   Cardiac Enzymes: No results for input(s): CKTOTAL, CKMB, CKMBINDEX, TROPONINI in the last 8760 hours. BNP: Invalid input(s): POCBNP Lab Results  Component Value Date   HGBA1C 5.9 (H) 07/11/2016   Lab Results  Component Value Date   TSH 2.007 12/08/2016    Imaging and Procedures noted on new patient packet: Need medical records from prior PCPs which were not yet received  Assessment/Plan 1. Myasthenia gravis without acute exacerbation (HCC) -continues on IV Ig infusions through Dr. Posey Pronto for this every 3 weeks -currently reports her legs are affected and weaker due to this -initially presented as diplopia and the second time she could not hold her head up -no longer on imuran -cont mestinon  2. COPD mixed type ([place]) -stable, cont anoro and prn albuterol  3. Ductal carcinoma in situ (DCIS) of right breast -has stopped her tamoxifen due to side effects and intends to simply follow her DCIS with q 6 mos mammograms--explained that she must contact Dr. Lindi Adie as there are possibly alternatives to the tamoxifen that she may tolerate better  -she really does not want to do things to prolong her life and also insists that the chance of the DCIS spreading is 2.5% so she's not concerned -she had opted not to have a mastectomy and chemo due to the inabiity to then get her myasthenia IVIg infusions  4. Seasonal and perennial allergic rhinitis -not on regular meds for this, sometimes this will flare up her COPD/asthmatic bronchitis  5. Gastroesophageal reflux disease without esophagitis -continues on dexilant, had Nissen fundoplication laparoscopically by Dr. Hassell Done years ago  6. Fibromyalgia -ongoing, uses robaxin qid and is on antidepressant therapy managed by Dr. Casimiro Needle for years (since pt's husband had chf and passed away) -may have some degree of IBS it seems, as well  7. Depression, major, single episode,  complete remission (HCC) -cont wellbutrin with klonopin for anxiety component  8.  ACP:  Reviewed her ACP documents.  See hpi for more details.  Pt is clear about NOT wanting things done that will prolong her life at her age.  She requested a DNR form be completed (she thought she had one, but did not recognize the gold paper).  We also discussed the MOST form's existence and may complete this at a future visit.    9.  DMII:  As far as I know, this is uncomplicated, need labs and records to determine for sure, f/u hba1c to determine control  Need records to confirm vaccines as well.  Labs/tests ordered:  Cbc, cmp, flp, hba1c next draw F/u with me 04/23/2018   Rickardo Brinegar L. Kahlea Cobert, D.O. [facility] [address], [place] [address] Cell Phone (Mon-Fri 8am-5pm):  [phone] On Call:  [phone] & follow prompts after 5pm & weekends Office Phone:  [phone] Office Fax:  [phone]

## 2017-12-11 NOTE — Progress Notes (Signed)
Office Visit Note   Patient: Erica Drake           Date of Birth: May 08, 1935           MRN: [id number] Visit Date: 12/11/2017              Requested by: Prince Solian, MD [place], [place] [address] PCP: Gayland Curry, DO   Assessment & Plan: Visit Diagnoses:  1. Left groin pain   2. Pain of left hip joint   3. Unilateral primary osteoarthritis, right knee   4. Unilateral primary osteoarthritis, left knee     Plan: I did provide a steroid injection in both knees today per her wishes.  She understands fully the risk and benefits of these types of injections.  At this point I do feel she is a candidate for hyaluronic acid for her knees.  We will order this for her.  I would also like to have her set up for an intra-articular steroid injection under direct fluoroscopy with Dr. Ernestina Patches.  I will see her back in 4 weeks to place the gel shots in both her knees and to see how she is done from the steroid injection in her left hip.  All question concerns were answered and addressed.  Follow-Up Instructions: Return in about 4 weeks (around 01/08/2018).   Orders:  No orders of the defined types were placed in this encounter.  No orders of the defined types were placed in this encounter.     Procedures: Large Joint Inj: R knee on 12/11/2017 1:34 PM Indications: diagnostic evaluation and pain Details: 22 G 1.5 in needle, superolateral approach  Arthrogram: No  Medications: 3 mL lidocaine 1 %; 40 mg methylPREDNISolone acetate 40 MG/ML Outcome: tolerated well, no immediate complications Procedure, treatment alternatives, risks and benefits explained, specific risks discussed. Consent was given by the patient. Immediately prior to procedure a time out was called to verify the correct patient, procedure, equipment, support staff and site/side marked as required. Patient was prepped and draped in the usual sterile fashion.   Large Joint Inj: L knee on 12/11/2017 1:34  PM Indications: diagnostic evaluation and pain Details: 22 G 1.5 in needle, superolateral approach  Arthrogram: No  Medications: 3 mL lidocaine 1 %; 40 mg methylPREDNISolone acetate 40 MG/ML Outcome: tolerated well, no immediate complications Procedure, treatment alternatives, risks and benefits explained, specific risks discussed. Consent was given by the patient. Immediately prior to procedure a time out was called to verify the correct patient, procedure, equipment, support staff and site/side marked as required. Patient was prepped and draped in the usual sterile fashion.       Clinical Data: No additional findings.   Subjective: Chief Complaint  Patient presents with  . Left Hip - Follow-up, Pain  . Lower Back - Pain  The patient is well-known to [place].  She has a history of fibromyalgia as well as myasthenia gravis and arthritis.  17 months ago she had a left knee arthroscopy by someone else in town.  It sounds like they did a partial medial meniscectomy and a chondroplasty.  Both knees hurt and her left hip hurts.  Her left hip hurts in the groin.  We have x-rayed all 3 areas.  Left hip just shows slight joint space narrowing comparing the left and right hips.  Both knees have slight medial joint space narrowing.  She would like to have injections today.  She is not a diabetic.  HPI  Review  of Systems She currently denies any headache or chest pain.  She denies any shortness of breath.  She denies any fever, chills, nausea, vomiting.  Objective: Vital Signs: There were no vitals taken for this visit.  Physical Exam She is alert and oriented x3 and in no acute distress Ortho Exam Examination of both knees show medial joint line tenderness with no significant effusion.  Her left hip has pain in the groin with internal extra rotation but the rotation is full. Specialty Comments:  No specialty comments available.  Imaging: No results found.   PMFS History: Patient Active  Problem List   Diagnosis Date Noted  . Chronic pain of left knee 10/09/2017  . Chronic pain of right knee 10/09/2017  . Unilateral primary osteoarthritis, left knee 10/09/2017  . Unilateral primary osteoarthritis, right knee 10/09/2017  . 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 Myasthenia gravis without acute exacerbation ([place]) 07/16/2017  . Acute medial meniscal tear 07/18/2016  . Acute medial meniscal tear, left, subsequent encounter 07/18/2016  . Asthmatic bronchitis, mild persistent, with acute exacerbation 12/29/2015  . Chronic cholecystitis 01/14/2015  . Angioedema 05/23/2014  . INSOMNIA 05/31/2007  . EUSTACHIAN TUBE DYSFUNCTION 04/30/2007  . Seasonal and perennial allergic rhinitis 04/30/2007  . COPD mixed type ([place]) 04/30/2007  . Esophageal reflux 04/30/2007  . Fibromyalgia 04/30/2007   Past Medical History:  Diagnosis Date  . Allergic rhinitis   . Arthritis   . Breast cancer ([place])   . Chronic bronchitis    some mild wheezing at change of weather-  . Chronic fatigue   . Complication of anesthesia    pt reports she always has to be admitted after surgery with pulmon. consult  d/t respiratory history  . COPD mixed type ([place])   . Decreased hearing    intol hearing aids. Deaf left ear, hearing aid right ear.  . Diabetes mellitus without complication ([place])   . Eustachian tube dysfunction    decreased hearing  . Fibromyalgia   . GERD (gastroesophageal reflux disease)    [place]  . Hx of myasthenia gravis   . Insomnia   . Interstitial cystitis    [facility]-Dr. Karsten Ro sees periodically.(occ. self caths at home)  . Renal cyst   . Vertigo     Family History  Adopted: Yes  Problem Relation Age of Onset  . Asthma Mother        biologic  . Breast cancer Mother        age 5  . Asthma Son   . Stroke Neg Hx   . Neuropathy Neg Hx   . Multiple sclerosis Neg Hx     Past Surgical History:  Procedure Laterality Date  . BREAST CYST  ASPIRATION    . CHOLECYSTECTOMY N/A 01/14/2015   Procedure: LAPAROSCOPIC CHOLECYSTECTOMY WITH INTRAOPERATIVE CHOLANGIOGRAM;  Surgeon: Johnathan Hausen, MD;  Location: [facility];  Service: General;  Laterality: N/A;  . KNEE ARTHROSCOPY WITH MEDIAL MENISECTOMY Left 07/18/2016   Procedure: LEFT KNEE ARTHROSCOPY WITH MEDIAL MENISECTOMY dedribement and chrodralplasty;  Surgeon: Gaynelle Arabian, MD;  Location: [facility];  Service: Orthopedics;  Laterality: Left;  . NISSEN FUNDOPLICATION     tx of GERD   Social History   Occupational History  . Not on file  Tobacco Use  . Smoking status: Former Research scientist (life sciences)  . Smokeless tobacco: Former Systems developer    Quit date: 01/09/1974  Substance and Sexual Activity  . Alcohol use: No  . Drug use: No  . Sexual  activity: Not on file

## 2017-12-12 ENCOUNTER — Telehealth (INDEPENDENT_AMBULATORY_CARE_PROVIDER_SITE_OTHER): Payer: Self-pay

## 2017-12-12 ENCOUNTER — Other Ambulatory Visit (INDEPENDENT_AMBULATORY_CARE_PROVIDER_SITE_OTHER): Payer: Self-pay

## 2017-12-12 DIAGNOSIS — M25552 Pain in left hip: Secondary | ICD-10-CM

## 2017-12-12 NOTE — Telephone Encounter (Signed)
Bilateral knee gel injections 

## 2017-12-13 NOTE — Telephone Encounter (Signed)
Noted  

## 2017-12-16 ENCOUNTER — Telehealth (INDEPENDENT_AMBULATORY_CARE_PROVIDER_SITE_OTHER): Payer: Self-pay

## 2017-12-16 DIAGNOSIS — F339 Major depressive disorder, recurrent, unspecified: Secondary | ICD-10-CM

## 2017-12-16 DIAGNOSIS — E119 Type 2 diabetes mellitus without complications: Secondary | ICD-10-CM | POA: Insufficient documentation

## 2017-12-16 DIAGNOSIS — F325 Major depressive disorder, single episode, in full remission: Secondary | ICD-10-CM | POA: Insufficient documentation

## 2017-12-16 NOTE — Telephone Encounter (Signed)
Submitted VOB for Monovisc, bilateral knee. 

## 2017-12-17 ENCOUNTER — Telehealth (INDEPENDENT_AMBULATORY_CARE_PROVIDER_SITE_OTHER): Payer: Self-pay

## 2017-12-17 NOTE — Telephone Encounter (Signed)
Pateint is approved for Monovisc, bilateral knee. Buy & Bill Covered at 100% through her insurance. No Co-pay No PA required   Appt.01/13/2018 with Dr. Ninfa Linden

## 2017-12-19 ENCOUNTER — Ambulatory Visit: Payer: Medicare Other | Admitting: Neurology

## 2017-12-30 ENCOUNTER — Telehealth: Payer: Self-pay | Admitting: Neurology

## 2017-12-30 NOTE — Telephone Encounter (Signed)
Noted  

## 2017-12-30 NOTE — Telephone Encounter (Signed)
[employer] called regarding this patient and to let you know that Gamunex-C was approved. Thanks

## 2018-01-01 ENCOUNTER — Ambulatory Visit (INDEPENDENT_AMBULATORY_CARE_PROVIDER_SITE_OTHER): Payer: Medicare Other | Admitting: Neurology

## 2018-01-01 ENCOUNTER — Encounter: Payer: Self-pay | Admitting: Neurology

## 2018-01-01 ENCOUNTER — Other Ambulatory Visit: Payer: Self-pay

## 2018-01-01 ENCOUNTER — Other Ambulatory Visit (INDEPENDENT_AMBULATORY_CARE_PROVIDER_SITE_OTHER): Payer: Medicare Other

## 2018-01-01 VITALS — BP 152/80 | HR 109 | Ht 65.0 in | Wt 144.0 lb

## 2018-01-01 DIAGNOSIS — Z79899 Other long term (current) drug therapy: Secondary | ICD-10-CM | POA: Diagnosis not present

## 2018-01-01 DIAGNOSIS — G7000 Myasthenia gravis without (acute) exacerbation: Secondary | ICD-10-CM

## 2018-01-01 LAB — CBC WITH DIFFERENTIAL/PLATELET
BASOS ABS: 0 10*3/uL (ref 0.0–0.1)
Basophils Relative: 1.2 % (ref 0.0–3.0)
EOS PCT: 2.2 % (ref 0.0–5.0)
Eosinophils Absolute: 0.1 10*3/uL (ref 0.0–0.7)
HCT: 38.5 % (ref 36.0–46.0)
Hemoglobin: 13.4 g/dL (ref 12.0–15.0)
Lymphocytes Relative: 13.3 % (ref 12.0–46.0)
Lymphs Abs: 0.5 10*3/uL — ABNORMAL LOW (ref 0.7–4.0)
MCHC: 34.8 g/dL (ref 30.0–36.0)
MCV: 97.8 fl (ref 78.0–100.0)
MONO ABS: 0.4 10*3/uL (ref 0.1–1.0)
Monocytes Relative: 10.7 % (ref 3.0–12.0)
Neutro Abs: 2.9 10*3/uL (ref 1.4–7.7)
Neutrophils Relative %: 72.6 % (ref 43.0–77.0)
Platelets: 314 10*3/uL (ref 150.0–400.0)
RBC: 3.94 Mil/uL (ref 3.87–5.11)
RDW: 13.8 % (ref 11.5–15.5)
WBC: 4.1 10*3/uL (ref 4.0–10.5)

## 2018-01-01 LAB — COMPREHENSIVE METABOLIC PANEL
ALK PHOS: 65 U/L (ref 39–117)
ALT: 11 U/L (ref 0–35)
AST: 22 U/L (ref 0–37)
Albumin: 3.8 g/dL (ref 3.5–5.2)
BILIRUBIN TOTAL: 0.3 mg/dL (ref 0.2–1.2)
BUN: 20 mg/dL (ref 6–23)
CO2: 29 mEq/L (ref 19–32)
Calcium: 9.2 mg/dL (ref 8.4–10.5)
Chloride: 103 mEq/L (ref 96–112)
Creatinine, Ser: 1.08 mg/dL (ref 0.40–1.20)
GFR: 51.88 mL/min — ABNORMAL LOW (ref >60.00)
GLUCOSE: 93 mg/dL (ref 70–99)
Potassium: 4 mEq/L (ref 3.5–5.1)
SODIUM: 138 meq/L (ref 135–145)
TOTAL PROTEIN: 8.1 g/dL (ref 6.0–8.3)

## 2018-01-01 MED ORDER — AZATHIOPRINE 75 MG PO TABS: 75.0000 mg | ORAL_TABLET | Freq: Two times a day (BID) | ORAL | 3 refills | Status: DC

## 2018-01-01 NOTE — Patient Instructions (Addendum)
Check labs  Increase azathioprine to 75mg  twice daily  Continue IVIG every 3 weeks and mestinon 60mg  three times daily  Return to clinic 3 months   Your provider has requested that you have labwork completed today. Please go to [facility] ([id number]) on the second floor of this building before leaving the office today. You do not need to check in. If you are not called within 15 minutes please check with the front desk.

## 2018-01-01 NOTE — Progress Notes (Signed)
Follow-up Visit   Date: 01/01/18    Erica Drake MRN: [id number] DOB: 02-15-1936   Interim History: Erica Drake is a 82 y.o. right-handed Caucasian female with fibromyalgia, GERD, depression, left sensorineural deficit, interstitial cystitis, and type 2 diabetes mellitus returning to the clinic for follow-up of myasthenia gravis.  The patient was accompanied to the clinic by self.  History of present illness: Initial visit 09/29/2015:  In May 2017, she began experiencing diurnal double vision, with images diagonal of each other and mild changes to her speech.  She has seen her ENT, PCP, ophthalmologist, and then saw Dr. Jaynee Eagles in June who ordered MRI/A head and orbit which was normal.  Her acetylcholine antibodies returned positive.  A prescription for mestinon 80m three times daily was ordered, but patient did not start it.  She has many drug intolerances/allergies, including cortisone, so medications were started at a low dose.   In the fall of 2017, I started prednisone 174mand increased mestinon to 6013mID which did not provide any relief. She stopped taking her prednisone because she has a number of other problems including diverticulosis and bronchitis, which she thought would be worsened by prednisone. Despite stopping her prednisone, there has been no change in her double vision.  [facility] and was recommended to have SFEMG, but she did not wish to return for testing.    She was managed on mestinon 80m93mD alone for most of 2018 due to intolerance with prednisone.  Unfortunately, she was hospitalized from 10/12 - 12/12/2016 with MG exacerbation manifesting with head drop, ptosis, and dysphagia.  She was treated with IVIG x 5 days with improvement in dysphasia and weakness.  However, neck heaviness persisted, so she was started on monthly IVIG.  She was also on prednisone 20mg31m discontinued this after developing mouth sores.  When she gets tired, her head starts to feel heavy.  In early 2019, she started maintenance dose of IVIG and still did not appreciate marked benefit, so the dose was increased to 1g/kg.  Azathioprine was started in February 2019 as a steroid-sparing medication.   In late Janua19-Feb-2024 husband passed away.  She did not tolerate prednisone 10mg 56mth ulcers), so stopped it.   UPDATE 07/16/2017:  She is here for follow-up visit and continues to do well with IVIG every 3 weeks and azathioprine 100mg/d62me has very rare spells of double vision and difficulty swallowing, but overall is feeling much stronger. She had surgery for rectocele two weeks ago and is recovering from this.  [place] will [place].    UPDATE 09/12/2017:  Her myasthenia gravis is well-controlled on medication and she has not had any breakthrough double vision, head weakness, or ptosis.  She remains on IVIG every 3 weeks and azathioprine 100mg/d.41me was newly diagnosed with breast cancer and due to her move into [place] has been unable to schedule appointment with her surgeon.   She is also having significant knee pain and is followed by Dr. Landau wMardelle Matteformed injections earlier this week.    UPDATE 01/01/2018:  She is here for follow-up visit.  She has decided not to treat her breast cancer and will not be getting mastectomy. She stopped tamoxifen due to side effects.   She has moved into a 1st floor villa at [facility] been extremely tired from this.  Today, she complains of generalized weakness  and bilateral knee/hip pain.  She has very mild double vision.  No problems with swallowing, shortness of breath, or falls.  She is compliant with her myasthenia medications.   Medications:  Current Outpatient Medications on File Prior to Visit  Medication Sig Dispense Refill  . albuterol (PROAIR HFA) 108 (90 Base) MCG/ACT inhaler Inhale 1-2 puffs into the lungs every 4 (four) hours as needed for wheezing or  shortness of breath.     Marland Kitchen albuterol (PROVENTIL) (2.5 MG/3ML) 0.083% nebulizer solution Take 3 mLs (2.5 mg total) by nebulization every 6 (six) hours as needed for wheezing or shortness of breath. 25 vial 5  . buPROPion (WELLBUTRIN SR) 200 MG 12 hr tablet Take 200 mg by mouth 2 (two) times daily after a meal.     . clonazePAM (KLONOPIN) 1 MG disintegrating tablet Take 1 mg by mouth 4 (four) times daily.    Marland Kitchen dexlansoprazole (DEXILANT) 60 MG capsule Take 60 mg by mouth daily.    Marland Kitchen GAMUNEX-C 5 GM/50ML SOLN   16  . meclizine (ANTIVERT) 12.5 MG tablet Take 1 tablet (12.5 mg total) by mouth 3 (three) times daily as needed for dizziness. 30 tablet 3  . methocarbamol (ROBAXIN) 500 MG tablet Take 1 tablet (500 mg total) by mouth 4 (four) times daily. As needed for muscle spasm 30 tablet 1  . methylcellulose (CITRUCEL) oral powder Take daily by mouth.    . polyethylene glycol (MIRALAX / GLYCOLAX) packet Take 17 g by mouth.    . pyridostigmine (MESTINON) 60 MG tablet Take 1 tablet (60 mg total) by mouth 3 (three) times daily. 270 tablet 3  . umeclidinium-vilanterol (ANORO ELLIPTA) 62.5-25 MCG/INH AEPB Inhale 1 puff into the lungs daily. 60 each 5   No current facility-administered medications on file prior to visit.     Allergies:  Allergies  Allergen Reactions  . Cortisone Shortness Of Breath, Swelling and Other (See Comments)    Tongue swelling   . Amlodipine Besylate Cough  . Chlorhexidine Gluconate Itching    Skin on face turned red   . Clindamycin Itching  . Dexamethasone Other (See Comments)    Per MD  . Duloxetine Other (See Comments)    Unknown reaction  . Morphine Sulfate Other (See Comments)    Unknown reaction  . Niacin Other (See Comments)  . Other Other (See Comments)    Pollen - nasal reaction  . Prednisone Other (See Comments)    Causes stomach pain. Sores in her mouth.   . Rosuvastatin Calcium Other (See Comments)    Muscle aches  . Statins Other (See Comments)     Muscle aches  . Sulfa Antibiotics Other (See Comments)    May possibly have caused deafness in one ear  . Valsartan Other (See Comments)    Unknown reaction  . Strawberry Extract Itching and Rash    Review of Systems:  CONSTITUTIONAL: No fevers, chills, night sweats, or weight loss.  EYES: No visual changes or eye pain ENT: No hearing changes.  No history of nose bleeds.   RESPIRATORY: No cough, wheezing and shortness of breath.   CARDIOVASCULAR: Negative for chest pain, and palpitations.   GI: Negative for abdominal discomfort, blood in stools or black stools.  No recent change in bowel habits.   GU:  No history of incontinence.   MUSCLOSKELETAL: +history of joint pain or swelling.  No myalgias.   SKIN: Negative for lesions, rash, and itching.   ENDOCRINE: Negative for cold or heat intolerance,  polydipsia or goiter.  + cancer PSYCH:  No depression or anxiety symptoms.   NEURO: As Above.   Vital Signs:  BP (!) 152/80 (BP Location: Left Arm, Patient Position: Sitting, Cuff Size: Normal)   Pulse (!) 109   Ht 5' 5"  (1.651 m)   Wt 144 lb (65.3 kg)   SpO2 96%   BMI 23.96 kg/m   General Medical Exam:   General:  Well appearing, comfortable  Eyes/ENT: see cranial nerve examination.   Neck: No carotid bruits. Respiratory:  Clear to auscultation, good air entry bilaterally.   Cardiac:  Regular rate and rhythm, no murmur.   Ext:  No edema  Neurological Exam: MENTAL STATUS including orientation to time, place, person, recent and remote memory, attention span and concentration, language, and fund of knowledge is normal.  Speech is not dysarthric or soft.  CRANIAL NERVES:  Pupils equal round and reactive to light.  Normal conjugate, extra-ocular eye movements in all directions of gaze.  No ptosis at rest.  Face is symmetric. Buccinator muscles are 5/5, orbicularis oculi and oris is 5/5. Tongue is midline and strength is 5/5  MOTOR:  Motor strength is 5-/5 throughout, including neck  flexion and hip flexors.   COORDINATION/GAIT:  Gait is antalgic due to bilateral knee pain, unassisted and stable.   Data: Labs 08/26/2015:  AChR binding (0.63) blocking (28) antibody, ESR 3, CRP 4.9  MRI/A head 09/09/2015:  This MR angiogram of the intracerebral arteries shows the following: 1. Very minimal atherosclerotic change within the left posterior cerebral artery that is unlikely to be clinically significant. 2. There is a normal variant with the left posterior cerebral artery obtaining its flow from the anterior circulation. 3. No aneurysms are seen. 4. No new findings compared to the 11/25/2003 MRA.   MRI orbit wwo contrast 09/09/2015:  This is is a normal MRI of the orbits with and without contrast.  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 She was hospitalized for MG crisis in December 2018, treated with IVIG, did not tolerate prednisone, she is managed with monthly IVIG. Azathioprine was added in February 2019.   Her exam today show mild generalized weakness of the arms and legs.  I do not appreciate bulbar weakness.  She is extremely tired from her recent move and feels exhausted, which is most likely playing a part.   She is scheduled for her IVIG next week, which will be continued every 3 weeks.  I will Increase azathioprine 68m twice daily (1592md) in hopes to control her disease better and hopefully start to taper IVIG.  Check CBC and CMP in 3 months Continue mestinon 6010mID Consider [employer], if she develops new exacerbation  Return to clinic in 3 months  Greater than 50% of this 25 minute visit was spent in counseling, explanation of diagnosis, planning of further management, and coordination of care.    Thank you for allowing me to participate in patient's care.  If I can answer any additional  questions, I would be pleased to do so.    Sincerely,     K. PatPosey ProntoO

## 2018-01-03 ENCOUNTER — Encounter: Payer: Self-pay | Admitting: *Deleted

## 2018-01-03 ENCOUNTER — Other Ambulatory Visit: Payer: Self-pay | Admitting: *Deleted

## 2018-01-03 ENCOUNTER — Telehealth: Payer: Self-pay | Admitting: *Deleted

## 2018-01-03 DIAGNOSIS — Z79899 Other long term (current) drug therapy: Secondary | ICD-10-CM

## 2018-01-03 DIAGNOSIS — G7000 Myasthenia gravis without (acute) exacerbation: Secondary | ICD-10-CM

## 2018-01-03 NOTE — Telephone Encounter (Signed)
-----   Message from Alda Berthold, DO sent at 01/02/2018  5:05 PM EST ----- Labs are stable.  I will continue to monitor lymphocyte count since this is trending down and may be related to medication.  Recheck labs in 2 months

## 2018-01-03 NOTE — Telephone Encounter (Signed)
Results and instructions sent via My Chart.  

## 2018-01-06 ENCOUNTER — Ambulatory Visit (INDEPENDENT_AMBULATORY_CARE_PROVIDER_SITE_OTHER): Payer: Self-pay | Admitting: Physical Medicine and Rehabilitation

## 2018-01-13 ENCOUNTER — Ambulatory Visit (INDEPENDENT_AMBULATORY_CARE_PROVIDER_SITE_OTHER): Payer: Self-pay | Admitting: Orthopaedic Surgery

## 2018-01-13 ENCOUNTER — Encounter: Payer: Self-pay | Admitting: Internal Medicine

## 2018-01-13 ENCOUNTER — Ambulatory Visit (INDEPENDENT_AMBULATORY_CARE_PROVIDER_SITE_OTHER): Payer: Medicare Other | Admitting: Internal Medicine

## 2018-01-13 VITALS — BP 142/76 | HR 109 | Temp 98.1°F | Ht 65.0 in | Wt 142.4 lb

## 2018-01-13 DIAGNOSIS — G7000 Myasthenia gravis without (acute) exacerbation: Secondary | ICD-10-CM | POA: Diagnosis not present

## 2018-01-13 DIAGNOSIS — J441 Chronic obstructive pulmonary disease with (acute) exacerbation: Secondary | ICD-10-CM | POA: Diagnosis not present

## 2018-01-13 DIAGNOSIS — R000 Tachycardia, unspecified: Secondary | ICD-10-CM

## 2018-01-13 MED ORDER — ALBUTEROL SULFATE HFA 108 (90 BASE) MCG/ACT IN AERS: 1.0000 | INHALATION_SPRAY | RESPIRATORY_TRACT | 0 refills | Status: DC | PRN

## 2018-01-13 MED ORDER — DOXYCYCLINE HYCLATE 100 MG PO TABS: 100.0000 mg | ORAL_TABLET | Freq: Two times a day (BID) | ORAL | 0 refills | Status: DC

## 2018-01-13 NOTE — Patient Instructions (Addendum)
Go get your chest xray at [facility].  When I get your results, we will call you.    I sent the doxycycline 100mg  twice a day for 10 days to [facility] [address].  Also I sent your one albuterol inhaler there.    Drink plenty of water and rest.     I will also notify Dr. Posey Pronto of your infection.

## 2018-01-13 NOTE — Progress Notes (Signed)
Location:  [facility] clinic Provider: Kitai Purdom L. Mariea Clonts, D.O., C.M.D.  Code Status: DNR Goals of Care:  Advanced Directives 01/13/2018  Does Patient Have a Medical Advance Directive? Yes  Type of Advance Directive Out of facility DNR (pink MOST or yellow form)  Does patient want to make changes to medical advance directive? No - Patient declined  Copy of [place] in Chart? -  Would patient like information on creating a medical advance directive? -  Pre-existing out of facility DNR order (yellow form or pink MOST form) Yellow form placed in chart (order not valid for inpatient use)     Chief Complaint  Patient presents with  . Acute Visit    Chest congestion, yellowish brown phlegm symptoms since friday, left eye drooping, body aches, will make eye exam    HPI: Patient is a 82 y.o. female with h/o myasthenia gravis, breast cancer that she refused mastectomy and treatment for, sleep apnea for which she refused cpap, diabetes, depression managed by psychiatry seen today for an acute visit for cough with chest congestion, yellowish brown sputum since Friday, left eye drooping and body aches.  Her heart rate is elevated.   She is hurting all over.  She just had her infusion tues/wed and then wen to the dining room to eat on Thursday and thinks she caught something there.  Her head, ears, jaws, chest and shoulders all hurt more than the rest of her body.  She stayed in the bed all weekend.    Has been drinking grape juice and blueberries with vitamin c and some bread with pimento cheese.    She cannot take prednisone.  Last night she felt hot, but she has not checked her temp.  She notes her temp is typically 97.5.  It is 98.1 today.    Past Medical History:  Diagnosis Date  . Allergic rhinitis   . Arthritis   . Breast cancer ([place])   . Chronic bronchitis    some mild wheezing at change of weather-  . Chronic fatigue   . Complication of anesthesia    pt reports she  always has to be admitted after surgery with pulmon. consult  d/t respiratory history  . COPD mixed type ([place])   . Decreased hearing    intol hearing aids. Deaf left ear, hearing aid right ear.  . Diabetes mellitus without complication ([place])   . Eustachian tube dysfunction    decreased hearing  . Fibromyalgia   . GERD (gastroesophageal reflux disease)    [place]  . Hx of myasthenia gravis   . Insomnia   . Interstitial cystitis    [facility]-Dr. Karsten Ro sees periodically.(occ. self caths at home)  . Renal cyst   . Vertigo     Past Surgical History:  Procedure Laterality Date  . BREAST CYST ASPIRATION    . CHOLECYSTECTOMY N/A 01/14/2015   Procedure: LAPAROSCOPIC CHOLECYSTECTOMY WITH INTRAOPERATIVE CHOLANGIOGRAM;  Surgeon: Johnathan Hausen, MD;  Location: [facility];  Service: General;  Laterality: N/A;  . KNEE ARTHROSCOPY WITH MEDIAL MENISECTOMY Left 07/18/2016   Procedure: LEFT KNEE ARTHROSCOPY WITH MEDIAL MENISECTOMY dedribement and chrodralplasty;  Surgeon: Gaynelle Arabian, MD;  Location: [facility];  Service: Orthopedics;  Laterality: Left;  . NISSEN FUNDOPLICATION     tx of GERD    Allergies  Allergen Reactions  . Cortisone Shortness Of Breath, Swelling and Other (See Comments)    Tongue swelling   . Amlodipine Besylate Cough  . Chlorhexidine Gluconate Itching  Skin on face turned red   . Clindamycin Itching  . Dexamethasone Other (See Comments)    Per MD  . Duloxetine Other (See Comments)    Unknown reaction  . Morphine Sulfate Other (See Comments)    Unknown reaction  . Niacin Other (See Comments)  . Other Other (See Comments)    Pollen - nasal reaction  . Prednisone Other (See Comments)    Causes stomach pain. Sores in her mouth.   . Rosuvastatin Calcium Other (See Comments)    Muscle aches  . Statins Other (See Comments)    Muscle aches  . Sulfa Antibiotics Other (See Comments)    May possibly have caused deafness in one ear  . Valsartan Other (See Comments)     Unknown reaction  . Strawberry Extract Itching and Rash    Outpatient Encounter Medications as of 01/13/2018  Medication Sig  . albuterol (PROAIR HFA) 108 (90 Base) MCG/ACT inhaler Inhale 1-2 puffs into the lungs every 4 (four) hours as needed for wheezing or shortness of breath.   Marland Kitchen albuterol (PROVENTIL) (2.5 MG/3ML) 0.083% nebulizer solution Take 3 mLs (2.5 mg total) by nebulization every 6 (six) hours as needed for wheezing or shortness of breath.  . Azathioprine 75 MG TABS Take 1 tablet (75 mg total) by mouth 2 (two) times daily.  Marland Kitchen buPROPion (WELLBUTRIN SR) 200 MG 12 hr tablet Take 200 mg by mouth 2 (two) times daily after a meal.   . clonazePAM (KLONOPIN) 1 MG disintegrating tablet Take 1 mg by mouth 4 (four) times daily.  Marland Kitchen dexlansoprazole (DEXILANT) 60 MG capsule Take 60 mg by mouth daily.  Marland Kitchen GAMUNEX-C 5 GM/50ML SOLN   . meclizine (ANTIVERT) 12.5 MG tablet Take 1 tablet (12.5 mg total) by mouth 3 (three) times daily as needed for dizziness.  . methocarbamol (ROBAXIN) 500 MG tablet Take 1 tablet (500 mg total) by mouth 4 (four) times daily. As needed for muscle spasm  . methylcellulose (CITRUCEL) oral powder Take daily by mouth.  . polyethylene glycol (MIRALAX / GLYCOLAX) packet Take 17 g by mouth.  . pyridostigmine (MESTINON) 60 MG tablet Take 1 tablet (60 mg total) by mouth 3 (three) times daily.  Marland Kitchen trimethoprim (TRIMPEX) 100 MG tablet Take 100 mg by mouth daily.  . [DISCONTINUED] umeclidinium-vilanterol (ANORO ELLIPTA) 62.5-25 MCG/INH AEPB Inhale 1 puff into the lungs daily.   No facility-administered encounter medications on file as of 01/13/2018.     Review of Systems:  Review of Systems  Constitutional: Negative for chills and fever.       Increased temp vs her baseline  HENT: Positive for congestion, hearing loss and sore throat. Negative for sinus pain.        Deaf left, HOH right  Eyes: Negative for blurred vision.       Left swollen droopy eye  Respiratory:  Positive for cough and sputum production. Negative for shortness of breath and wheezing.        Has had increased use of albuterol  Cardiovascular: Negative for chest pain and palpitations.       Tachycardic  Gastrointestinal: Negative for abdominal pain, blood in stool, constipation, diarrhea and melena.  Genitourinary: Negative for dysuria.  Musculoskeletal: Positive for myalgias. Negative for falls.       Achy all over--reports worse than her typical myasthenia pain  Skin: Negative for itching and rash.  Neurological: Negative for dizziness and loss of consciousness.  Endo/Heme/Allergies: Does not bruise/bleed easily.  Psychiatric/Behavioral: Negative for depression and  memory loss. The patient is not nervous/anxious and does not have insomnia.     Health Maintenance  Topic Date Due  . FOOT EXAM  12/29/1945  . OPHTHALMOLOGY EXAM  12/29/1945  . URINE MICROALBUMIN  12/29/1945  . TETANUS/TDAP  12/28/1956  . DEXA SCAN  12/29/2002  . PNA vac Low Risk Adult (1 of 2 - PCV13) 12/29/2002  . HEMOGLOBIN A1C  01/11/2017  . MAMMOGRAM  07/03/2018  . INFLUENZA VACCINE  Completed    Physical Exam: Vitals:   01/13/18 1554  BP: (!) 142/76  Pulse: (!) 109  Temp: 98.1 F (36.7 C)  TempSrc: Oral  SpO2: 93%  Weight: 142 lb 6.4 oz (64.6 kg)  Height: 5\' 5"  (1.651 m)   Body mass index is 23.7 kg/m. Physical Exam  Constitutional: She is oriented to person, place, and time. She appears well-developed and well-nourished. No distress.  HENT:  Head: Normocephalic and atraumatic.  Cardiovascular:  Tachy, regular  Pulmonary/Chest: Effort normal and breath sounds normal. No respiratory distress. She has no wheezes.  Abdominal: Bowel sounds are normal.  Musculoskeletal: Normal range of motion.  Neurological: She is alert and oriented to person, place, and time.  Skin: Skin is warm and dry. Capillary refill takes less than 2 seconds.  Psychiatric: She has a normal mood and affect.    Labs  reviewed: Basic Metabolic Panel: Recent Labs    06/04/17 1550 07/31/17 1209 01/01/18 1008  NA 136 135* 138  K 4.9 5.3* 4.0  CL 104 102 103  CO2 29 27 29   GLUCOSE 100* 86 93  BUN 19 15 20   CREATININE 1.27* 1.37* 1.08  CALCIUM 9.4 9.2 9.2   Liver Function Tests: Recent Labs    06/04/17 1550 07/31/17 1209 01/01/18 1008  AST 23 24 22   ALT 11 9 11   ALKPHOS 53 60 65  BILITOT 0.5 <0.2* 0.3  PROT 8.8* 9.4* 8.1  ALBUMIN 3.9 3.3* 3.8   No results for input(s): LIPASE, AMYLASE in the last 8760 hours. No results for input(s): AMMONIA in the last 8760 hours. CBC: Recent Labs    06/04/17 1550 07/31/17 1209 01/01/18 1008  WBC 6.5 4.4 4.1  NEUTROABS 4.2 2.7 2.9  HGB 12.6 12.6 13.4  HCT 35.9* 36.9 38.5  MCV 93.1 97.3 97.8  PLT 471.0* 355 314.0   Lipid Panel: No results for input(s): CHOL, HDL, LDLCALC, TRIG, CHOLHDL, LDLDIRECT in the last 8760 hours. Lab Results  Component Value Date   HGBA1C 5.9 (H) 07/11/2016    Assessment/Plan 1. COPD with acute exacerbation ([place]) - has what seems to be acute bronchitis--r/o pneumonia with xray given tachy and increased temp for pt who is immunocompromised - DG Chest 2 View - doxycycline (VIBRA-TABS) 100 MG tablet; Take 1 tablet (100 mg total) by mouth 2 (two) times daily.  Dispense: 20 tablet; Refill: 0 - albuterol (PROAIR HFA) 108 (90 Base) MCG/ACT inhaler; Inhale 1-2 puffs into the lungs every 4 (four) hours as needed for wheezing or shortness of breath.  Dispense: 1 Inhaler; Refill: 0  2. Tachycardia with heart rate 100-120 beats per minute -reports related to pain per pt, but also can be from her use of proair -eating some and drinking fluids, but encouraged more for fear she is getting dehydration  3. Myasthenia gravis without acute exacerbation ([facility]) -cont gamunex, mestinon per Dr. Posey Pronto -currently with more myalgias and still has drooping of left eye - will notify Dr. Posey Pronto of current situation  Labs/tests ordered:  Orders Placed This Encounter  Procedures  . DG Chest 2 View    Order Specific Question:   Reason for Exam (SYMPTOM  OR DIAGNOSIS REQUIRED)    Answer:   cough, congestion, tachycardia    Order Specific Question:   Preferred imaging location?    Answer:   [facility]   Next appt:  04/23/2018  Lilyahna Sirmon L. Lucciano Vitali, D.O. [facility] [address], [place] [address] Cell Phone (Mon-Fri 8am-5pm):  [phone] On Call:  [phone] & follow prompts after 5pm & weekends Office Phone:  [phone] Office Fax:  [phone]

## 2018-01-14 ENCOUNTER — Ambulatory Visit
Admission: RE | Admit: 2018-01-14 | Discharge: 2018-01-14 | Disposition: A | Discharge status: Home / Self Care | Payer: Medicare Other | Source: Ambulatory Visit | Attending: Internal Medicine | Admitting: Internal Medicine

## 2018-01-14 DIAGNOSIS — R05 Cough: Secondary | ICD-10-CM | POA: Diagnosis not present

## 2018-01-16 ENCOUNTER — Telehealth: Payer: Self-pay | Admitting: *Deleted

## 2018-01-16 ENCOUNTER — Telehealth: Payer: Self-pay | Admitting: Internal Medicine

## 2018-01-16 MED ORDER — ALBUTEROL SULFATE (2.5 MG/3ML) 0.083% IN NEBU: 2.5000 mg | INHALATION_SOLUTION | Freq: Four times a day (QID) | RESPIRATORY_TRACT | 5 refills | Status: DC | PRN

## 2018-01-16 NOTE — Telephone Encounter (Signed)
Patient called and stated that she was seen on Monday for URI. Patient wants to know if you will call her in a Nebulizer medication to use in her Nebulizer for her breathing/horaseness. Wants it called to [facility] to Deliver to [facility]. Please Advise.

## 2018-01-16 NOTE — Telephone Encounter (Signed)
rx sent to pharmacy by e-script  

## 2018-01-16 NOTE — Telephone Encounter (Signed)
Spoke with pt. She is needing a refill on Albuterol neb solution. Advised her that it has been over a year since we have seen her. This prescription was sent in by another MD already today. Pt is aware of this. She has been scheduled to see CY on 04/07/18 at 2pm. Nothing further was needed at this time.

## 2018-01-17 ENCOUNTER — Telehealth: Payer: Self-pay | Admitting: *Deleted

## 2018-01-17 NOTE — Telephone Encounter (Signed)
I called patient to see how she is doing per Dr. Serita Grit request.  She said that she is doing better.  She does not have pneumonia but does have bronchitis.  Dr. Posey Pronto notified.

## 2018-01-20 ENCOUNTER — Telehealth: Payer: Self-pay | Admitting: Neurology

## 2018-01-20 DIAGNOSIS — F341 Dysthymic disorder: Secondary | ICD-10-CM | POA: Diagnosis not present

## 2018-01-20 NOTE — Telephone Encounter (Signed)
Patient is calling in needing to speak with you in regards to symptoms. Please call her back at [phone]. Thanks!

## 2018-01-20 NOTE — Telephone Encounter (Signed)
Did she mention where her weakness is?  Any symptoms of double vision, neck heaviness, shortness of breath, arm/leg weakness?  Myasthenia does not cause pain, so if she is having pain-related weakness, then injection seems reasonable.  Has she completed course of antibiotics for infection?  If she has weakness, without pain/illness, then she would need to be evaluated.

## 2018-01-20 NOTE — Telephone Encounter (Signed)
I spoke with patient and relayed what Dr. Posey Pronto has said.  She said that she would try the injection since she thinks the weakness is mostly coming from her having bronchitis.

## 2018-01-20 NOTE — Telephone Encounter (Signed)
I spoke with patient and she said that she is feeling really weak and it is all she can do to take care of herself and her dog.  Her orthopedic doctor offered to give her an injection for her leg pain.  She would like to know your thoughts on this.

## 2018-01-21 ENCOUNTER — Ambulatory Visit (INDEPENDENT_AMBULATORY_CARE_PROVIDER_SITE_OTHER): Payer: Self-pay | Admitting: Physical Medicine and Rehabilitation

## 2018-01-21 LAB — HM DIABETES EYE EXAM

## 2018-01-22 ENCOUNTER — Ambulatory Visit (INDEPENDENT_AMBULATORY_CARE_PROVIDER_SITE_OTHER): Payer: Self-pay | Admitting: Orthopaedic Surgery

## 2018-01-28 NOTE — Progress Notes (Signed)
Patient Care Team: Gayland Curry, DO as PCP - General (Geriatric Medicine) Erroll Luna, MD as Consulting Physician (General Surgery) Nicholas Lose, MD as Consulting Physician (Hematology and Oncology) Eppie Gibson, MD as Attending Physician (Radiation Oncology)  DIAGNOSIS:    ICD-10-CM   1. Post-menopausal Z78.0 MM DIAG BREAST TOMO UNI RIGHT    DG Bone Density  2. Ductal carcinoma in situ (DCIS) of right breast D05.11 MM DIAG BREAST TOMO UNI RIGHT    SUMMARY OF ONCOLOGIC HISTORY:   Ductal carcinoma in situ (DCIS) of right breast   07/19/2017 Initial Diagnosis    Screening detected right breast calcifications and distortion.  The distortion was in the upper outer quadrant: Biopsy DCIS intermediate grade with CSL; calcifications UIQ: 2.8 cm: Biopsy DCIS+ ALH +CSL; 4 cm apart, axilla negative, Tis NX stage 0     CHIEF COMPLIANT: Follow-up of right DCIS to discuss different treatment options  INTERVAL HISTORY: Erica Drake is a 82 y.o. with above-mentioned history of DCIS of the right breast. I last saw the patient 3 months ago and she was very indecisive about surgery and starting any treatment. She canceled her surgery that was scheduled for 11/12/17. She presents to the clinic today to discuss treatment options. She notes she stopped taking Tamoxifen after about 4-6 weeks, as it made her mouth very dry and produced mouth sores. She is tentatively open to trying anastrozole as an alternative anti-estrogen therapy.   REVIEW OF SYSTEMS:   Constitutional: Denies fevers, chills or abnormal weight loss Eyes: Denies blurriness of vision Ears, nose, mouth, throat, and face: Denies mucositis or sore throat Respiratory: Denies cough, dyspnea or wheezes Cardiovascular: Denies palpitation, chest discomfort Gastrointestinal:  Denies nausea, heartburn or change in bowel habits Skin: Denies abnormal skin rashes Lymphatics: Denies new lymphadenopathy or easy bruising Neurological:Denies  numbness, tingling or new weaknesses Behavioral/Psych: Mood is stable, no new changes  Extremities: No lower extremity edema Breast: denies any pain or lumps or nodules in either breasts All other systems were reviewed with the patient and are negative.  I have reviewed the past medical history, past surgical history, social history and family history with the patient and they are unchanged from previous note.  ALLERGIES:  is allergic to cortisone; amlodipine besylate; chlorhexidine gluconate; clindamycin; dexamethasone; duloxetine; morphine sulfate; niacin; other; prednisone; rosuvastatin calcium; statins; sulfa antibiotics; valsartan; and strawberry extract.  MEDICATIONS:  Current Outpatient Medications  Medication Sig Dispense Refill  . albuterol (PROAIR HFA) 108 (90 Base) MCG/ACT inhaler Inhale 1-2 puffs into the lungs every 4 (four) hours as needed for wheezing or shortness of breath. 1 Inhaler 0  . albuterol (PROVENTIL) (2.5 MG/3ML) 0.083% nebulizer solution Take 3 mLs (2.5 mg total) by nebulization every 6 (six) hours as needed for wheezing or shortness of breath. 25 vial 5  . anastrozole (ARIMIDEX) 1 MG tablet Take 1 tablet (1 mg total) by mouth daily. 30 tablet 1  . Azathioprine 75 MG TABS Take 1 tablet (75 mg total) by mouth 2 (two) times daily. 180 each 3  . buPROPion (WELLBUTRIN SR) 200 MG 12 hr tablet Take 200 mg by mouth 2 (two) times daily after a meal.     . clonazePAM (KLONOPIN) 1 MG disintegrating tablet Take 1 mg by mouth 4 (four) times daily.    Marland Kitchen dexlansoprazole (DEXILANT) 60 MG capsule Take 60 mg by mouth daily.    Marland Kitchen doxycycline (VIBRA-TABS) 100 MG tablet Take 1 tablet (100 mg total) by mouth 2 (two) times daily.  20 tablet 0  . GAMUNEX-C 5 GM/50ML SOLN   16  . meclizine (ANTIVERT) 12.5 MG tablet Take 1 tablet (12.5 mg total) by mouth 3 (three) times daily as needed for dizziness. 30 tablet 3  . methocarbamol (ROBAXIN) 500 MG tablet Take 1 tablet (500 mg total) by mouth 4  (four) times daily. As needed for muscle spasm 30 tablet 1  . methylcellulose (CITRUCEL) oral powder Take daily by mouth.    . polyethylene glycol (MIRALAX / GLYCOLAX) packet Take 17 g by mouth.    . pyridostigmine (MESTINON) 60 MG tablet Take 1 tablet (60 mg total) by mouth 3 (three) times daily. 270 tablet 3  . trimethoprim (TRIMPEX) 100 MG tablet Take 100 mg by mouth daily.  2   No current facility-administered medications for this visit.     PHYSICAL EXAMINATION: ECOG PERFORMANCE STATUS: 1 - Symptomatic but completely ambulatory  Vitals:   01/29/18 1523  BP: (!) 172/65  Pulse: (!) 102  Resp: 17  Temp: 97.9 F (36.6 C)  SpO2: 96%   Filed Weights   01/29/18 1523  Weight: 145 lb (65.8 kg)    GENERAL:alert, no distress and comfortable SKIN: skin color, texture, turgor are normal, no rashes or significant lesions EYES: normal, Conjunctiva are pink and non-injected, sclera clear OROPHARYNX:no exudate, no erythema and lips, buccal mucosa, and tongue normal  NECK: supple, thyroid normal size, non-tender, without nodularity LYMPH:  no palpable lymphadenopathy in the cervical, axillary or inguinal LUNGS: clear to auscultation and percussion with normal breathing effort HEART: regular rate & rhythm and no murmurs and no lower extremity edema ABDOMEN:abdomen soft, non-tender and normal bowel sounds MUSCULOSKELETAL:no cyanosis of digits and no clubbing  NEURO: alert & oriented x 3 with fluent speech, no focal motor/sensory deficits EXTREMITIES: No lower extremity edema  LABORATORY DATA:  I have reviewed the data as listed CMP Latest Ref Rng & Units 01/01/2018 07/31/2017 06/04/2017  Glucose 70 - 99 mg/dL 93 86 100(H)  BUN 6 - 23 mg/dL 20 15 19   Creatinine 0.40 - 1.20 mg/dL 1.08 1.37(H) 1.27(H)  Sodium 135 - 145 mEq/L 138 135(L) 136  Potassium 3.5 - 5.1 mEq/L 4.0 5.3(H) 4.9  Chloride 96 - 112 mEq/L 103 102 104  CO2 19 - 32 mEq/L 29 27 29   Calcium 8.4 - 10.5 mg/dL 9.2 9.2 9.4  Total  Protein 6.0 - 8.3 g/dL 8.1 9.4(H) 8.8(H)  Total Bilirubin 0.2 - 1.2 mg/dL 0.3 <0.2(L) 0.5  Alkaline Phos 39 - 117 U/L 65 60 53  AST 0 - 37 U/L 22 24 23   ALT 0 - 35 U/L 11 9 11     Lab Results  Component Value Date   WBC 4.1 01/01/2018   HGB 13.4 01/01/2018   HCT 38.5 01/01/2018   MCV 97.8 01/01/2018   PLT 314.0 01/01/2018   NEUTROABS 2.9 01/01/2018    ASSESSMENT & PLAN:  Ductal carcinoma in situ (DCIS) of right breast 5/24/2019Screening detected right breast calcifications and distortion. The distortion was in the upper outer quadrant: Biopsy DCIS intermediate grade with CSL; calcifications UIQ: 2.8 cm: Biopsy DCIS+ ALH +CSL; 4 cm apart, axilla negative, Tis NX stage 0  Significant delay in decision making because of psychosocial issues and myasthenia gravis. Patient refused surgery and went on Tamoxifen 10 mg daily for 1 month and discontinued it.  Tamoxifen Toxicities: Patient had side effects with tamoxifen and she discontinued it after 1 month. I recommended that she take antiestrogen therapy and represcribed anastrozole.  She  is very concerned about the muscle aches and pains side effect of anastrozole. She will try half a tablet daily and see if she can tolerate it. I recommended that she get a mammogram next week as well as a bone density test. RTC in 6 months for follow-up.    Orders Placed This Encounter  Procedures  . MM DIAG BREAST TOMO UNI RIGHT    Standing Status:   Future    Standing Expiration Date:   01/30/2019    Order Specific Question:   Reason for Exam (SYMPTOM  OR DIAGNOSIS REQUIRED)    Answer:   DCIS not had surgery    Order Specific Question:   Preferred imaging location?    Answer:   [facility]  . DG Bone Density    Standing Status:   Future    Standing Expiration Date:   03/05/2019    Order Specific Question:   Reason for exam:    Answer:   Post menopausal    Order Specific Question:   Preferred imaging location?    Answer:   [facility]   The patient has a good understanding of the overall plan. she agrees with it. she will call with any problems that may develop before the next visit here.  Nicholas Lose, MD 01/29/2018   I, Molly Dorshimer, am acting as scribe for Nicholas Lose, MD.  I have reviewed the above documentation for accuracy and completeness, and I agree with the above.

## 2018-01-28 NOTE — Assessment & Plan Note (Signed)
5/24/2019Screening detected right breast calcifications and distortion. The distortion was in the upper outer quadrant: Biopsy DCIS intermediate grade with CSL; calcifications UIQ: 2.8 cm: Biopsy DCIS+ ALH +CSL; 4 cm apart, axilla negative, Tis NX stage 0  Significant delay in decision making because of psychosocial issues. Patient refused surgery and went on Tamoxifen 10 mg daily  Tamoxifen Toxicities:  RTC in 3 months with mammogram and U?S and follow up

## 2018-01-29 ENCOUNTER — Telehealth: Payer: Self-pay | Admitting: Hematology and Oncology

## 2018-01-29 ENCOUNTER — Inpatient Hospital Stay: Payer: Medicare Other | Attending: Hematology and Oncology | Admitting: Hematology and Oncology

## 2018-01-29 ENCOUNTER — Encounter: Payer: Self-pay | Admitting: Internal Medicine

## 2018-01-29 VITALS — BP 172/65 | HR 102 | Temp 97.9°F | Resp 17 | Ht 65.0 in | Wt 145.0 lb

## 2018-01-29 DIAGNOSIS — Z780 Asymptomatic menopausal state: Secondary | ICD-10-CM | POA: Diagnosis not present

## 2018-01-29 DIAGNOSIS — Z79899 Other long term (current) drug therapy: Secondary | ICD-10-CM | POA: Insufficient documentation

## 2018-01-29 DIAGNOSIS — D0511 Intraductal carcinoma in situ of right breast: Secondary | ICD-10-CM

## 2018-01-29 DIAGNOSIS — G7000 Myasthenia gravis without (acute) exacerbation: Secondary | ICD-10-CM | POA: Diagnosis not present

## 2018-01-29 MED ORDER — ANASTROZOLE 1 MG PO TABS: 1.0000 mg | ORAL_TABLET | Freq: Every day | ORAL | 1 refills | Status: DC

## 2018-01-29 NOTE — Telephone Encounter (Signed)
Printed calendar and avs. °

## 2018-01-30 ENCOUNTER — Telehealth: Payer: Self-pay | Admitting: Neurology

## 2018-01-30 NOTE — Telephone Encounter (Signed)
Dr. Posey Pronto - I am unfamiliar with these medications and how important timing is with them, please advise.

## 2018-01-30 NOTE — Telephone Encounter (Signed)
Returned call and spoke with pt's caregiver, Caitlyn, advising that treatment will need to be the week following Christmas as insurance will not cover if less than every 3 weeks (Verbal per DR. Posey Pronto)

## 2018-01-30 NOTE — Telephone Encounter (Signed)
Patient's caretaker is calling in stating that the patient has treatments the week of christmas but will be going out of town and wants to know if these need to be moved to the week before or after christmas. Please call her back at [phone]. Thanks!

## 2018-01-31 ENCOUNTER — Encounter: Payer: Self-pay | Admitting: Internal Medicine

## 2018-02-04 ENCOUNTER — Ambulatory Visit (INDEPENDENT_AMBULATORY_CARE_PROVIDER_SITE_OTHER): Payer: Self-pay | Admitting: Physical Medicine and Rehabilitation

## 2018-02-06 ENCOUNTER — Telehealth (INDEPENDENT_AMBULATORY_CARE_PROVIDER_SITE_OTHER): Payer: Self-pay | Admitting: Physical Medicine and Rehabilitation

## 2018-02-06 NOTE — Telephone Encounter (Signed)
Patient called this morning wanting to make an appointment with Dr. Ninfa Linden for a regular cortisone injection in her left hip.  She stated that she once had an appointment with Dr. Ernestina Patches, but did not want the type of cortisone he uses, she would like to speak with you about Dr. Ernestina Patches using the Methylprednisolone instead and she wanted to know if she could have an earlier appointment than December 31st.  CB#[phone].  Thank you.

## 2018-02-06 NOTE — Telephone Encounter (Signed)
Please inform her that if we only have a slot open for the hip injection that would be not long enough to do a complete evaluation of her lumbar spine.  She would need to be rescheduled into a time slot to allow [place] evaluation of that.  She could have asked Dr. Ninfa Linden to give an opinion on whether he thought this was her back or her hip.  He did request a hip injection some pretty positive at least at that point he thought it was her hip but not her spine.

## 2018-02-06 NOTE — Telephone Encounter (Signed)
Please advise on hip injection/ steroid. Patient was scheduled for a hip injection with you, but she wanted to have a discussion about whether the pain was coming from her hip or back and possibly get a back injection instead. She has had these before at another office. She is currently scheduled for an OV and bringing records to discuss.

## 2018-02-07 NOTE — Telephone Encounter (Signed)
Called patient and left message.

## 2018-02-10 ENCOUNTER — Other Ambulatory Visit: Payer: Self-pay | Admitting: Hematology and Oncology

## 2018-02-10 ENCOUNTER — Ambulatory Visit
Admission: RE | Admit: 2018-02-10 | Discharge: 2018-02-10 | Disposition: A | Discharge status: Home / Self Care | Payer: Medicare Other | Source: Ambulatory Visit | Attending: Hematology and Oncology | Admitting: Hematology and Oncology

## 2018-02-10 DIAGNOSIS — K582 Mixed irritable bowel syndrome: Secondary | ICD-10-CM | POA: Diagnosis not present

## 2018-02-10 DIAGNOSIS — N183 Chronic kidney disease, stage 3 (moderate): Secondary | ICD-10-CM | POA: Diagnosis not present

## 2018-02-10 DIAGNOSIS — M797 Fibromyalgia: Secondary | ICD-10-CM | POA: Diagnosis not present

## 2018-02-10 DIAGNOSIS — D0511 Intraductal carcinoma in situ of right breast: Secondary | ICD-10-CM

## 2018-02-10 DIAGNOSIS — Z780 Asymptomatic menopausal state: Secondary | ICD-10-CM

## 2018-02-10 DIAGNOSIS — G7000 Myasthenia gravis without (acute) exacerbation: Secondary | ICD-10-CM | POA: Diagnosis not present

## 2018-02-10 DIAGNOSIS — K219 Gastro-esophageal reflux disease without esophagitis: Secondary | ICD-10-CM | POA: Diagnosis not present

## 2018-02-10 DIAGNOSIS — M85851 Other specified disorders of bone density and structure, right thigh: Secondary | ICD-10-CM | POA: Diagnosis not present

## 2018-02-10 DIAGNOSIS — E119 Type 2 diabetes mellitus without complications: Secondary | ICD-10-CM | POA: Diagnosis not present

## 2018-02-10 DIAGNOSIS — R922 Inconclusive mammogram: Secondary | ICD-10-CM | POA: Diagnosis not present

## 2018-02-10 DIAGNOSIS — D126 Benign neoplasm of colon, unspecified: Secondary | ICD-10-CM | POA: Diagnosis not present

## 2018-02-13 DIAGNOSIS — Z85828 Personal history of other malignant neoplasm of skin: Secondary | ICD-10-CM | POA: Diagnosis not present

## 2018-02-13 DIAGNOSIS — L820 Inflamed seborrheic keratosis: Secondary | ICD-10-CM | POA: Diagnosis not present

## 2018-02-13 DIAGNOSIS — D1801 Hemangioma of skin and subcutaneous tissue: Secondary | ICD-10-CM | POA: Diagnosis not present

## 2018-02-13 DIAGNOSIS — L814 Other melanin hyperpigmentation: Secondary | ICD-10-CM | POA: Diagnosis not present

## 2018-02-13 DIAGNOSIS — L821 Other seborrheic keratosis: Secondary | ICD-10-CM | POA: Diagnosis not present

## 2018-02-13 DIAGNOSIS — D225 Melanocytic nevi of trunk: Secondary | ICD-10-CM | POA: Diagnosis not present

## 2018-02-14 ENCOUNTER — Telehealth: Payer: Self-pay | Admitting: *Deleted

## 2018-02-14 NOTE — Telephone Encounter (Signed)
If she does have neuropathy, we may be able to treat this without another specialist referral.  Also, this is often managed by neurology who she also already sees.  If she's willing to come to the office, maybe we can see her before appt which is not scheduled until 2/26 with me.

## 2018-02-14 NOTE — Telephone Encounter (Signed)
Patient called and stated that she has had leg and back pain for awhile with no relief from shots. Stated that she saw her GI Dr. And he told her that she could have Neuropathy and referred her to Dr. Elpidio Galea at [facility]. Dr. Lamar Sprinkles office is requesting a referral from the PCP office. Patient is wanting a referral placed. Please Advise.

## 2018-02-17 NOTE — Telephone Encounter (Signed)
Spoke with patient and advised results, she will check with her neurologist first.

## 2018-02-21 ENCOUNTER — Telehealth: Payer: Self-pay

## 2018-02-21 NOTE — Telephone Encounter (Signed)
Returned patient's call regarding recent diagnostic mammogram results.  Patient voiced desire to have MRI obtained d/t density of breast.  Nurse educated patient again on parameters and recommendations.  Patient continued to request MRI, nurse explained she would have prior authorization initiated.  Patient voiced understanding and agreement.  Will await authorization before scheduling.

## 2018-02-24 ENCOUNTER — Encounter

## 2018-02-24 ENCOUNTER — Ambulatory Visit: Payer: Medicare Other | Admitting: Neurology

## 2018-02-25 ENCOUNTER — Encounter (INDEPENDENT_AMBULATORY_CARE_PROVIDER_SITE_OTHER): Payer: Self-pay | Admitting: Physical Medicine and Rehabilitation

## 2018-02-25 ENCOUNTER — Ambulatory Visit (INDEPENDENT_AMBULATORY_CARE_PROVIDER_SITE_OTHER): Payer: Medicare Other | Admitting: Physical Medicine and Rehabilitation

## 2018-02-25 ENCOUNTER — Ambulatory Visit (INDEPENDENT_AMBULATORY_CARE_PROVIDER_SITE_OTHER): Payer: Medicare Other

## 2018-02-25 ENCOUNTER — Encounter

## 2018-02-25 VITALS — BP 166/83 | HR 78 | Ht 64.0 in | Wt 142.0 lb

## 2018-02-25 DIAGNOSIS — G8929 Other chronic pain: Secondary | ICD-10-CM

## 2018-02-25 DIAGNOSIS — M545 Low back pain: Secondary | ICD-10-CM

## 2018-02-25 DIAGNOSIS — M5441 Lumbago with sciatica, right side: Secondary | ICD-10-CM

## 2018-02-25 DIAGNOSIS — M5416 Radiculopathy, lumbar region: Secondary | ICD-10-CM

## 2018-02-25 DIAGNOSIS — M25552 Pain in left hip: Secondary | ICD-10-CM

## 2018-02-25 DIAGNOSIS — M5442 Lumbago with sciatica, left side: Secondary | ICD-10-CM

## 2018-02-25 DIAGNOSIS — M47816 Spondylosis without myelopathy or radiculopathy, lumbar region: Secondary | ICD-10-CM | POA: Diagnosis not present

## 2018-02-25 NOTE — Progress Notes (Signed)
 .  Numeric Pain Rating Scale and Functional Assessment Average Pain 6 Pain Right Now 5 My pain is constant, dull and aching Pain is worse with: walking and standing Pain improves with: nothing   In the last MONTH (on 0-10 scale) has pain interfered with the following?  1. General activity like being  able to carry out your everyday physical activities such as walking, climbing stairs, carrying groceries, or moving a chair?  Rating(7)  2. Relation with others like being able to carry out your usual social activities and roles such as  activities at home, at work and in your community. Rating(7)  3. Enjoyment of life such that you have  been bothered by emotional problems such as feeling anxious, depressed or irritable?  Rating(4)

## 2018-03-04 ENCOUNTER — Telehealth: Payer: Self-pay

## 2018-03-04 NOTE — Telephone Encounter (Signed)
Pt called to follow up on request to have a breast MRI done. Pt states that she received a letter from the breast center about having dense breast and follow up MRI would be best to do. Discussed latest MM result with Dr.Gudena, pt has category C dense breast, which doesn't really require an MRI evaluation at this time. Pt is coming back in 6 months to follow up with Dr.Gudena. Pt may discuss a possibility of doing an MRI at her next appt. Pt verbalized understanding and confirmed her next appt.

## 2018-03-06 ENCOUNTER — Ambulatory Visit (INDEPENDENT_AMBULATORY_CARE_PROVIDER_SITE_OTHER): Payer: Self-pay | Admitting: Physical Medicine and Rehabilitation

## 2018-03-07 ENCOUNTER — Telehealth (INDEPENDENT_AMBULATORY_CARE_PROVIDER_SITE_OTHER): Payer: Self-pay | Admitting: Physical Medicine and Rehabilitation

## 2018-03-07 ENCOUNTER — Ambulatory Visit
Admission: RE | Admit: 2018-03-07 | Discharge: 2018-03-07 | Disposition: A | Discharge status: Home / Self Care | Payer: Medicare Other | Source: Ambulatory Visit | Attending: Physical Medicine and Rehabilitation | Admitting: Physical Medicine and Rehabilitation

## 2018-03-07 DIAGNOSIS — M545 Low back pain, unspecified: Secondary | ICD-10-CM

## 2018-03-07 DIAGNOSIS — M5126 Other intervertebral disc displacement, lumbar region: Secondary | ICD-10-CM | POA: Diagnosis not present

## 2018-03-07 DIAGNOSIS — M47816 Spondylosis without myelopathy or radiculopathy, lumbar region: Secondary | ICD-10-CM

## 2018-03-07 DIAGNOSIS — G8929 Other chronic pain: Secondary | ICD-10-CM

## 2018-03-07 DIAGNOSIS — M48061 Spinal stenosis, lumbar region without neurogenic claudication: Secondary | ICD-10-CM | POA: Diagnosis not present

## 2018-03-07 DIAGNOSIS — M4726 Other spondylosis with radiculopathy, lumbar region: Secondary | ICD-10-CM | POA: Diagnosis not present

## 2018-03-07 NOTE — Telephone Encounter (Signed)
Patient called requesting release form be mailed to her as she wants to get her records requested from [facility] sent to Dr. Ernestina Patches. I mailed her release form today [address] [place]

## 2018-03-13 ENCOUNTER — Encounter (INDEPENDENT_AMBULATORY_CARE_PROVIDER_SITE_OTHER): Payer: Self-pay | Admitting: Physical Medicine and Rehabilitation

## 2018-03-13 NOTE — Progress Notes (Signed)
Erica Drake - 83 y.o. female MRN [id number]  Date of birth: 08-31-1935  Office Visit Note: Visit Date: 02/25/2018 PCP: Gayland Curry, DO Referred by: Gayland Curry, DO  Subjective: Chief Complaint  Patient presents with  . Lower Back - Pain  . Right Leg - Pain  . Left Leg - Pain  . Left Foot - Pain  . Right Foot - Pain  . Left Hip - Pain   HPI: Erica Drake is a 83 y.o. female who comes in today At the request of Dr. Jean Rosenthal for evaluation management of low back pain and what was more left than right but now bilateral hip pain.  She denies any groin pain.  Dr. Ninfa Linden had evaluated her at the time and felt like intra-articular injection of the hip was warranted from a diagnostic standpoint.  The patient has a lot of other medical conditions including myasthenia gravis and was very concerned about injections and medications.  She canceled several appointments at that time.  She does come in today with average pain of 6 out of 10 which is constant dull and aching worse across the lower back into both lateral anterior and posterior hip and she says down to both feet.  No real numbness tingling or paresthesia.  She reports pain started months ago and is just getting worse and worse and worse.  She reports that walking and standing make it worse and nothing is really help with the pain at this point.  She is very intolerant to a lot of medications and has 15 allergies listed.  When Dr. Ninfa Linden saw her she was having some left groin pain.  She has not had recent MRI imaging of the lumbar spine and she is never had any back surgery.  X-rays of the pelvis and knees were completed by Dr. Ninfa Linden as well.  These were reviewed with the patient.  She denies any focal weakness.  She has not had any unexplained weight loss or bowel or bladder changes.  She does see urology at [facility].  Again she reports multiple areas of orthopedic pain with bilateral radicular pain which is  fairly new in terms of complaints.  Appropriate Use Addendum Patient was evaluated for low back pain and according to appropriate use criteria (did) receive imaging tests.  Review of Systems  Constitutional: Positive for malaise/fatigue. Negative for chills, fever and weight loss.  HENT: Negative for hearing loss and sinus pain.   Eyes: Negative for blurred vision, double vision and photophobia.  Respiratory: Negative for cough and shortness of breath.   Cardiovascular: Negative for chest pain, palpitations and leg swelling.  Gastrointestinal: Negative for abdominal pain, nausea and vomiting.  Genitourinary: Negative for flank pain.  Musculoskeletal: Positive for back pain and joint pain. Negative for myalgias.  Skin: Negative for itching and rash.  Neurological: Negative for tremors, focal weakness and weakness.  Endo/Heme/Allergies: Negative.   Psychiatric/Behavioral: Negative for depression.  All other systems reviewed and are negative.  Otherwise per HPI.  Assessment & Plan: Visit Diagnoses:  1. Chronic low back pain with bilateral sciatica, unspecified back pain laterality   2. Spondylosis without myelopathy or radiculopathy, lumbar region   3. Chronic bilateral low back pain without sciatica   4. Lumbar radiculopathy   5. Pain in left hip     Plan: Findings:  Chronic worsening axial low back pain with pain down both legs which has progressively gotten worse over the last many months.  She  is followed for an orthopedic standpoint by Dr. Jean Rosenthal and is also seen Dr. Gaynelle Arabian at [facility].  X-ray imaging of the hips is showing minimal arthritic changes.  She does have significant facet arthropathy and by foraminal narrowing and may be a mild retrolisthesis of L3 on L4.  I think MRI is warranted to look for lumbar stenosis.  We talked about injection treatment depending on what this shows.  We have had patients in the past with myasthenia gravis in the  steroid injection should not be of a big issue with her.  She does have multiple drug intolerances however.  May have some level of underlying fibromyalgia.  With her medical conditions and age probably not likely a good surgical candidate.  We will see her back after the MRI findings.    Meds & Orders: No orders of the defined types were placed in this encounter.   Orders Placed This Encounter  Procedures  . XR Lumbar Spine Complete  . MR LUMBAR SPINE WO CONTRAST    Follow-up: Return for MRI review after completion.   Procedures: No procedures performed  No notes on file   Clinical History: No specialty comments available.   She reports that she has quit smoking. She quit smokeless tobacco use about 44 years ago. No results for input(s): HGBA1C, LABURIC in the last 8760 hours.  Objective:  VS:  HT:5\' 4"  (162.6 cm)   WT:142 lb (64.4 kg)  BMI:24.36    BP:(!) 166/83  HR:78bpm  TEMP: ( )  RESP:95 % Physical Exam Vitals signs and nursing note reviewed.  Constitutional:      General: She is not in acute distress.    Appearance: Normal appearance. She is well-developed.  HENT:     Head: Normocephalic and atraumatic.     Nose: Nose normal.     Mouth/Throat:     Mouth: Mucous membranes are moist.     Pharynx: Oropharynx is clear.  Eyes:     Conjunctiva/sclera: Conjunctivae normal.     Pupils: Pupils are equal, round, and reactive to light.  Neck:     Musculoskeletal: Normal range of motion and neck supple.  Cardiovascular:     Rate and Rhythm: Regular rhythm.  Pulmonary:     Effort: Pulmonary effort is normal. No respiratory distress.  Abdominal:     General: There is no distension.     Palpations: Abdomen is soft.     Tenderness: There is no guarding.  Musculoskeletal:     Right lower leg: No edema.     Left lower leg: No edema.     Comments: Patient ambulates with a slightly forward flexed lumbar spine has pain with facet joint loading and extension of the lumbar  spine.  Has tenderness across the lower back and PSIS and greater trochanters but nothing specific.  No focal trigger points.  No pain with hip rotation.  Good distal strength.  No clonus.  Skin:    General: Skin is warm and dry.     Findings: No erythema or rash.  Neurological:     General: No focal deficit present.     Mental Status: She is alert and oriented to person, place, and time.     Motor: No abnormal muscle tone.     Coordination: Coordination normal.     Gait: Gait normal.  Psychiatric:        Mood and Affect: Mood normal.        Behavior: Behavior normal.  Thought Content: Thought content normal.     Ortho Exam Imaging: 4 view lumbar spine dated today shows fairly normal anatomic alignment with may be small retrolisthesis of L3 on L4.  There is significant facet arthropathy at L4-5 and L5-S1.  Disc heights are fairly well-maintained although they are a little bit less.  Again hips are noted bilaterally to have minimal degenerative changes.  Sacroiliac joints are well-maintained without sclerosis.  There are no pars defects noted.  Past Medical/Family/Surgical/Social History: Medications & Allergies reviewed per EMR, new medications updated. Patient Active Problem List   Diagnosis Date Noted  . Type 2 diabetes mellitus without complication, without long-term current use of insulin ([place]) 12/16/2017  . Depression, major, single episode, complete remission ([place]) 12/16/2017  . Chronic pain of left knee 10/09/2017  . Chronic pain of right knee 10/09/2017  . Unilateral primary osteoarthritis, left knee 10/09/2017  . Unilateral primary osteoarthritis, right knee 10/09/2017  . 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 Myasthenia gravis without acute exacerbation ([place]) 07/16/2017  . Acute medial meniscal tear 07/18/2016  . Acute medial meniscal tear, left, subsequent encounter 07/18/2016  . Asthmatic bronchitis, mild persistent, with acute exacerbation  12/29/2015  . Chronic cholecystitis 01/14/2015  . Angioedema 05/23/2014  . INSOMNIA 05/31/2007  . EUSTACHIAN TUBE DYSFUNCTION 04/30/2007  . Seasonal and perennial allergic rhinitis 04/30/2007  . COPD mixed type ([place]) 04/30/2007  . Esophageal reflux 04/30/2007  . Fibromyalgia 04/30/2007   Past Medical History:  Diagnosis Date  . Allergic rhinitis   . Arthritis   . Breast cancer ([place])   . Chronic bronchitis    some mild wheezing at change of weather-  . Chronic fatigue   . Complication of anesthesia    pt reports she always has to be admitted after surgery with pulmon. consult  d/t respiratory history  . COPD mixed type ([place])   . Decreased hearing    intol hearing aids. Deaf left ear, hearing aid right ear.  . Diabetes mellitus without complication ([place])   . Eustachian tube dysfunction    decreased hearing  . Fibromyalgia   . GERD (gastroesophageal reflux disease)    [place]  . Hx of myasthenia gravis   . Insomnia   . Interstitial cystitis    [facility]-Dr. Karsten Ro sees periodically.(occ. self caths at home)  . Renal cyst   . Vertigo    Family History  Adopted: Yes  Problem Relation Age of Onset  . Asthma Mother        biologic  . Breast cancer Mother        age 52  . Asthma Son   . Stroke Neg Hx   . Neuropathy Neg Hx   . Multiple sclerosis Neg Hx    Past Surgical History:  Procedure Laterality Date  . BREAST CYST ASPIRATION    . CHOLECYSTECTOMY N/A 01/14/2015   Procedure: LAPAROSCOPIC CHOLECYSTECTOMY WITH INTRAOPERATIVE CHOLANGIOGRAM;  Surgeon: Johnathan Hausen, MD;  Location: [facility];  Service: General;  Laterality: N/A;  . KNEE ARTHROSCOPY WITH MEDIAL MENISECTOMY Left 07/18/2016   Procedure: LEFT KNEE ARTHROSCOPY WITH MEDIAL MENISECTOMY dedribement and chrodralplasty;  Surgeon: Gaynelle Arabian, MD;  Location: [facility];  Service: Orthopedics;  Laterality: Left;  . NISSEN FUNDOPLICATION     tx of GERD   Social History   Occupational History  . Not on file    Tobacco Use  . Smoking status: Former Research scientist (life sciences)  . Smokeless tobacco: Former Systems developer    Quit date: 01/09/1974  Substance and Sexual Activity  . Alcohol use: No  . Drug use: No  . Sexual activity: Not on file

## 2018-03-18 ENCOUNTER — Ambulatory Visit (INDEPENDENT_AMBULATORY_CARE_PROVIDER_SITE_OTHER): Payer: Self-pay

## 2018-03-18 ENCOUNTER — Encounter (INDEPENDENT_AMBULATORY_CARE_PROVIDER_SITE_OTHER): Payer: Self-pay | Admitting: Physical Medicine and Rehabilitation

## 2018-03-18 ENCOUNTER — Ambulatory Visit (INDEPENDENT_AMBULATORY_CARE_PROVIDER_SITE_OTHER): Payer: Medicare Other | Admitting: Physical Medicine and Rehabilitation

## 2018-03-18 DIAGNOSIS — M5442 Lumbago with sciatica, left side: Secondary | ICD-10-CM

## 2018-03-18 DIAGNOSIS — G8929 Other chronic pain: Secondary | ICD-10-CM

## 2018-03-18 DIAGNOSIS — M25552 Pain in left hip: Secondary | ICD-10-CM

## 2018-03-18 NOTE — Progress Notes (Signed)
Haydee Drake - 83 y.o. female MRN [id number]  Date of birth: 1935-08-21  Office Visit Note: Visit Date: 03/18/2018 PCP: Gayland Curry, DO Referred by: Gayland Curry, DO  Subjective: Chief Complaint  Patient presents with  . Left Hip - Pain  . Left Leg - Pain   HPI: Erica Drake is a 83 y.o. female who comes in today Reevaluation and MRI review of her chronic worsening low back and left hip and leg pain.  Again she has been having chronic left hip and leg pain with some groin pain but also referral pain at least at times to the ankle without paresthesia.  No right-sided complaints.  She is been followed exclusively by Dr. Ninfa Linden who requested diagnostic arthrogram of the left hip but the patient was very concerned about having any injections done with history of problems with cortisone and steroid medications as well as history of myasthenia gravis.  We talked a lot about injections of medications at our first visit as we did see her as a consultation.  I did obtain an MRI of her lumbar spine since it has been a while from the prior MRI of her back.  This is reviewed with her today at length in detail with pictures and models.  Her lumbar spine really is not very problematic and for her age is actually a fairly young appearing spine.  Most of her pain is with standing and ambulating and movement in direction.  This does seem to be very mechanical with a catching quality.  Review of Systems  Constitutional: Positive for malaise/fatigue. Negative for chills, fever and weight loss.  HENT: Negative for hearing loss and sinus pain.   Eyes: Negative for blurred vision, double vision and photophobia.  Respiratory: Negative for cough and shortness of breath.   Cardiovascular: Negative for chest pain, palpitations and leg swelling.  Gastrointestinal: Negative for abdominal pain, nausea and vomiting.  Genitourinary: Negative for flank pain.  Musculoskeletal: Positive for back pain and  joint pain. Negative for myalgias.  Skin: Negative for itching and rash.  Neurological: Positive for weakness. Negative for tremors and focal weakness.  Endo/Heme/Allergies: Negative.   Psychiatric/Behavioral: Negative for depression.  All other systems reviewed and are negative.  Otherwise per HPI.  Assessment & Plan: Visit Diagnoses:  1. Pain in left hip   2. Chronic left-sided low back pain with left-sided sciatica     Plan: Findings:  Chronic worsening left hip and groin pain but with some referral pattern that even goes to the ankle.  X-ray evidence of the hip shows osteoarthritis but not severe.  Patient saw [place] after Dr. Ninfa Linden because she really had a lot of questions about injections in her back.  She does suffer from myasthenia gravis.  We answered all those questions at the last office visit but did obtain an MRI of her lumbar spine and has talked about in the history of present illness this was reviewed with her today and with models.  Her lumbar spine really is not very problematic.  There is an issue at L4-5 but without focal stenosis or compression.  It could cause irritation and she may be having trouble with this but I think her main finding is with her left hip.  Today we did complete intra-articular left hip injection diagnostically hopefully therapeutically and this actually relieves a lot of her symptoms.  She will continue to follow-up with Dr. Ninfa Linden for her left hip.  I do not think there  is really much to do with her spine at this point.    Meds & Orders: No orders of the defined types were placed in this encounter.   Orders Placed This Encounter  Procedures  . Large Joint Inj: L hip joint  . XR C-ARM NO REPORT    Follow-up: Return if symptoms worsen or fail to improve.   Procedures: Large Joint Inj: L hip joint on 03/18/2018 2:04 PM Indications: pain and diagnostic evaluation Details: 22 G needle, anterior approach  Arthrogram: Yes  Medications: 3 mL  bupivacaine 0.5 %; 60 mg triamcinolone acetonide 40 MG/ML Outcome: tolerated well, no immediate complications  Arthrogram demonstrated excellent flow of contrast throughout the joint surface without extravasation or obvious defect.  The patient had relief of symptoms during the anesthetic phase of the injection.  Procedure, treatment alternatives, risks and benefits explained, specific risks discussed. Consent was given by the patient. Immediately prior to procedure a time out was called to verify the correct patient, procedure, equipment, support staff and site/side marked as required. Patient was prepped and draped in the usual sterile fashion.      No notes on file   Clinical History: No specialty comments available.   She reports that she has quit smoking. She quit smokeless tobacco use about 44 years ago. No results for input(s): HGBA1C, LABURIC in the last 8760 hours.  Objective:  VS:  HT:    WT:   BMI:     BP:   HR: bpm  TEMP: ( )  RESP:  Physical Exam Vitals signs and nursing note reviewed.  Constitutional:      General: She is not in acute distress.    Appearance: Normal appearance. She is well-developed. She is not ill-appearing.  HENT:     Head: Normocephalic and atraumatic.  Eyes:     Conjunctiva/sclera: Conjunctivae normal.     Pupils: Pupils are equal, round, and reactive to light.  Cardiovascular:     Rate and Rhythm: Normal rate.     Pulses: Normal pulses.  Pulmonary:     Effort: Pulmonary effort is normal.  Musculoskeletal:     Right lower leg: No edema.     Left lower leg: No edema.     Comments: Patient has mild pain with pure lumbar extension and facet loading.  She has a lot of difficulty however going from sit to stand with a lot of catching pain in the left hip.  She does have pain with hip rotation at extreme range of motion more internal and external.  No pain over the greater trochanters no pain over the PSIS.  No focal trigger points.  She has good  distal strength bilaterally.  She does ambulate with an antalgic gait pretty significantly to the left.  No Trendelenburg gait.  Skin:    General: Skin is warm and dry.     Findings: No erythema or rash.  Neurological:     General: No focal deficit present.     Mental Status: She is alert and oriented to person, place, and time.     Sensory: No sensory deficit.     Motor: No abnormal muscle tone.     Coordination: Coordination normal.     Gait: Gait abnormal.  Psychiatric:        Mood and Affect: Mood normal.        Behavior: Behavior normal.     Ortho Exam Imaging: Xr C-arm No Report  Result Date: 03/18/2018 Please see Notes tab for  imaging impression.   Past Medical/Family/Surgical/Social History: Medications & Allergies reviewed per EMR, new medications updated. Patient Active Problem List   Diagnosis Date Noted  . Type 2 diabetes mellitus without complication, without long-term current use of insulin ([place]) 12/16/2017  . Depression, major, single episode, complete remission ([place]) 12/16/2017  . Chronic pain of left knee 10/09/2017  . Chronic pain of right knee 10/09/2017  . Unilateral primary osteoarthritis, left knee 10/09/2017  . Unilateral primary osteoarthritis, right knee 10/09/2017  . 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 Myasthenia gravis without acute exacerbation ([place]) 07/16/2017  . Acute medial meniscal tear 07/18/2016  . Acute medial meniscal tear, left, subsequent encounter 07/18/2016  . Asthmatic bronchitis, mild persistent, with acute exacerbation 12/29/2015  . Chronic cholecystitis 01/14/2015  . Angioedema 05/23/2014  . INSOMNIA 05/31/2007  . EUSTACHIAN TUBE DYSFUNCTION 04/30/2007  . Seasonal and perennial allergic rhinitis 04/30/2007  . COPD mixed type ([place]) 04/30/2007  . Esophageal reflux 04/30/2007  . Fibromyalgia 04/30/2007   Past Medical History:  Diagnosis Date  . Allergic rhinitis   . Arthritis   . Breast cancer ([place])    . Chronic bronchitis    some mild wheezing at change of weather-  . Chronic fatigue   . Complication of anesthesia    pt reports she always has to be admitted after surgery with pulmon. consult  d/t respiratory history  . COPD mixed type ([place])   . Decreased hearing    intol hearing aids. Deaf left ear, hearing aid right ear.  . Diabetes mellitus without complication ([place])   . Eustachian tube dysfunction    decreased hearing  . Fibromyalgia   . GERD (gastroesophageal reflux disease)    [place]  . Hx of myasthenia gravis   . Insomnia   . Interstitial cystitis    [facility]-Dr. Karsten Ro sees periodically.(occ. self caths at home)  . Renal cyst   . Vertigo    Family History  Adopted: Yes  Problem Relation Age of Onset  . Asthma Mother        biologic  . Breast cancer Mother        age 70  . Asthma Son   . Stroke Neg Hx   . Neuropathy Neg Hx   . Multiple sclerosis Neg Hx    Past Surgical History:  Procedure Laterality Date  . BREAST CYST ASPIRATION    . CHOLECYSTECTOMY N/A 01/14/2015   Procedure: LAPAROSCOPIC CHOLECYSTECTOMY WITH INTRAOPERATIVE CHOLANGIOGRAM;  Surgeon: Johnathan Hausen, MD;  Location: [facility];  Service: General;  Laterality: N/A;  . KNEE ARTHROSCOPY WITH MEDIAL MENISECTOMY Left 07/18/2016   Procedure: LEFT KNEE ARTHROSCOPY WITH MEDIAL MENISECTOMY dedribement and chrodralplasty;  Surgeon: Gaynelle Arabian, MD;  Location: [facility];  Service: Orthopedics;  Laterality: Left;  . NISSEN FUNDOPLICATION     tx of GERD   Social History   Occupational History  . Not on file  Tobacco Use  . Smoking status: Former Research scientist (life sciences)  . Smokeless tobacco: Former Systems developer    Quit date: 01/09/1974  Substance and Sexual Activity  . Alcohol use: No  . Drug use: No  . Sexual activity: Not on file

## 2018-03-18 NOTE — Progress Notes (Signed)
 .  Numeric Pain Rating Scale and Functional Assessment Average Pain 7   In the last MONTH (on 0-10 scale) has pain interfered with the following?  1. General activity like being  able to carry out your everyday physical activities such as walking, climbing stairs, carrying groceries, or moving a chair?  Rating(6)   -Dye Allergies.  

## 2018-03-18 NOTE — Patient Instructions (Signed)

## 2018-03-19 ENCOUNTER — Ambulatory Visit: Payer: Medicare Other | Admitting: Neurology

## 2018-03-19 ENCOUNTER — Encounter

## 2018-03-19 MED ORDER — BUPIVACAINE HCL 0.5 % IJ SOLN
3.0000 mL | INTRAMUSCULAR | Status: AC | PRN
  Administered 2018-03-18: 3 mL via INTRA_ARTICULAR

## 2018-03-19 MED ORDER — TRIAMCINOLONE ACETONIDE 40 MG/ML IJ SUSP
60.0000 mg | INTRAMUSCULAR | Status: AC | PRN
  Administered 2018-03-18: 60 mg via INTRA_ARTICULAR

## 2018-04-03 DIAGNOSIS — F341 Dysthymic disorder: Secondary | ICD-10-CM | POA: Diagnosis not present

## 2018-04-03 DIAGNOSIS — E119 Type 2 diabetes mellitus without complications: Secondary | ICD-10-CM | POA: Diagnosis not present

## 2018-04-03 DIAGNOSIS — H43813 Vitreous degeneration, bilateral: Secondary | ICD-10-CM | POA: Diagnosis not present

## 2018-04-03 DIAGNOSIS — H04123 Dry eye syndrome of bilateral lacrimal glands: Secondary | ICD-10-CM | POA: Diagnosis not present

## 2018-04-03 DIAGNOSIS — H5213 Myopia, bilateral: Secondary | ICD-10-CM | POA: Diagnosis not present

## 2018-04-03 NOTE — Progress Notes (Signed)
Follow-up Visit   Date: 04/04/18    LYNNEX FULP MRN: [id number] DOB: 03-Jul-1935   Interim History: Erica Drake is a 83 y.o. right-handed Caucasian female with fibromyalgia, GERD, depression, left sensorineural deficit, interstitial cystitis, and type 2 diabetes mellitus returning to the clinic for follow-up of myasthenia gravis.  The patient was accompanied to the clinic by self.  History of present illness: Initial visit 09/29/2015:  In May 2017, she began experiencing diurnal double vision, with images diagonal of each other and mild changes to her speech.  She has seen her ENT, PCP, ophthalmologist, and then saw Dr. Jaynee Eagles in June who ordered MRI/A head and orbit which was normal.  Her acetylcholine antibodies returned positive.  A prescription for mestinon 32m three times daily was ordered, but patient did not start it.  She has many drug intolerances/allergies, including cortisone, so medications were started at a low dose.   In the fall of 2017, I started prednisone 123mand increased mestinon to 60101mID which did not provide any relief. She stopped taking her prednisone because she has a number of other problems including diverticulosis and bronchitis, which she thought would be worsened by prednisone. Despite stopping her prednisone, there has been no change in her double vision.  [facility] and was recommended to have [facility], but she did not wish to return for testing.    She was managed on mestinon 73m26mD alone for most of 2018 due to intolerance with prednisone.  Unfortunately, she was hospitalized from 10/12 - 12/12/2016 with MG exacerbation manifesting with head drop, ptosis, and dysphagia.  She was treated with IVIG x 5 days with improvement in dysphasia and weakness.  However, neck heaviness persisted, so she was started on monthly IVIG.  She was also on prednisone 20mg36m discontinued this after developing mouth sores.  When she gets tired, her head starts to feel heavy.    In early 2019, she started maintenance dose of IVIG and still did not appreciate marked benefit, so the dose was increased to 1g/kg every 3 weeks.  Azathioprine 100mg/39ms started in February 2019 as a steroid-sparing medication.   In late Januar22-Feb-2024husband passed away.  She did not tolerate prednisone 10mg (73mh ulcers), so stopped it.   She moved into [facility] in June 2019.  She was newly diagnosed with breast cancer and due to her move into [facility].  She has decided not to treat her breast cancer and will not be getting mastectomy. She stopped tamoxifen due to side effects.     UPDATE 04/03/2018:  She is here for follow-up visit.  Myasthenia gravis seems to be well-controlled and she has not had any breakthrough double vision, head weakness, or ptosis.  She continue to take IVIG every 21 days and azathioprine 75mg tw62mdaily.  She does report generalized fatigue and MSK pain of left knee and hip, for which she has been getting injections.  Overall, she feels there is a general decline in health and well-being.   Medications:  Current Outpatient Medications on File Prior to Visit  Medication Sig Dispense Refill  . albuterol (PROAIR HFA) 108 (90 Base) MCG/ACT inhaler Inhale 1-2 puffs into the lungs every 4 (four) hours as needed for wheezing or shortness of breath. 1 Inhaler 0  . albuterol (PROVENTIL) (2.5 MG/3ML) 0.083% nebulizer solution Take 3 mLs (2.5 mg total) by nebulization every 6 (six) hours as needed for wheezing  or shortness of breath. 25 vial 5  . anastrozole (ARIMIDEX) 1 MG tablet Take 1 tablet (1 mg total) by mouth daily. 30 tablet 1  . Azathioprine 75 MG TABS Take 1 tablet (75 mg total) by mouth 2 (two) times daily. 180 each 3  . buPROPion (WELLBUTRIN SR) 200 MG 12 hr tablet Take 200 mg by mouth 2 (two) times daily after a meal.     . clonazePAM (KLONOPIN) 1 MG tablet     . dexlansoprazole (DEXILANT) 60 MG capsule Take  60 mg by mouth daily.    Marland Kitchen doxycycline (VIBRA-TABS) 100 MG tablet Take 1 tablet (100 mg total) by mouth 2 (two) times daily. 20 tablet 0  . GAMUNEX-C 5 GM/50ML SOLN   16  . meclizine (ANTIVERT) 12.5 MG tablet Take 1 tablet (12.5 mg total) by mouth 3 (three) times daily as needed for dizziness. 30 tablet 3  . methocarbamol (ROBAXIN) 500 MG tablet Take 1 tablet (500 mg total) by mouth 4 (four) times daily. As needed for muscle spasm 30 tablet 1  . methylcellulose (CITRUCEL) oral powder Take daily by mouth.    . montelukast (SINGULAIR) 10 MG tablet Take by mouth.    . nitrofurantoin, macrocrystal-monohydrate, (MACROBID) 100 MG capsule     . polyethylene glycol (MIRALAX / GLYCOLAX) packet Take 17 g by mouth.    . pyridostigmine (MESTINON) 60 MG tablet Take 1 tablet (60 mg total) by mouth 3 (three) times daily. 270 tablet 3  . trimethoprim (TRIMPEX) 100 MG tablet Take 100 mg by mouth daily.  2   No current facility-administered medications on file prior to visit.     Allergies:  Allergies  Allergen Reactions  . Cortisone Shortness Of Breath, Swelling and Other (See Comments)    Tongue swelling   . Amlodipine Besylate Cough  . Chlorhexidine Gluconate Itching    Skin on face turned red   . Clindamycin Itching  . Dexamethasone Other (See Comments)    Per MD  . Duloxetine Other (See Comments)    Unknown reaction  . Morphine Sulfate Other (See Comments)    Unknown reaction  . Niacin Other (See Comments)  . Other Other (See Comments)    Pollen - nasal reaction  . Prednisone Other (See Comments)    Causes stomach pain. Sores in her mouth.   . Rosuvastatin Calcium Other (See Comments)    Muscle aches  . Statins Other (See Comments)    Muscle aches  . Sulfa Antibiotics Other (See Comments)    May possibly have caused deafness in one ear  . Valsartan Other (See Comments)    Unknown reaction  . Strawberry Extract Itching and Rash    Review of Systems:  CONSTITUTIONAL: No fevers,  chills, night sweats, or weight loss.  EYES: No visual changes or eye pain ENT: No hearing changes.  No history of nose bleeds.   RESPIRATORY: No cough, wheezing and shortness of breath.   CARDIOVASCULAR: Negative for chest pain, and palpitations.   GI: Negative for abdominal discomfort, blood in stools or black stools.  No recent change in bowel habits.   GU:  No history of incontinence.   MUSCLOSKELETAL: +history of joint pain or swelling.  No myalgias.   SKIN: Negative for lesions, rash, and itching.   ENDOCRINE: Negative for cold or heat intolerance, polydipsia or goiter.  + cancer PSYCH:  No depression or anxiety symptoms.   NEURO: As Above.   Vital Signs:  BP (!) 148/80   Pulse  90   Ht 5' 4"  (1.626 m)   Wt 140 lb 6 oz (63.7 kg)   SpO2 94%   BMI 24.10 kg/m   General Medical Exam:   General:  Well appearing, comfortable  Eyes/ENT: see cranial nerve examination.   Neck: No carotid bruits. Respiratory:  Clear to auscultation, good air entry bilaterally.   Cardiac:  Regular rate and rhythm, no murmur.   Ext:  No edema  Neurological Exam: MENTAL STATUS including orientation to time, place, person, recent and remote memory, attention span and concentration, language, and fund of knowledge is normal.  Speech is not dysarthric or soft.  CRANIAL NERVES:  Pupils equal round and reactive to light.  Normal conjugate, extra-ocular eye movements in all directions of gaze.  No ptosis at rest.  Face is symmetric. Buccinator muscles are 5/5, orbicularis oculi and oris is 5/5. Tongue is midline and strength is 5/5  MOTOR:  Motor strength is 5-/5 throughout, including neck flexion and hip flexors.   COORDINATION/GAIT:  Gait is antalgic due to hip and knee pain, unassisted and stable.   Data: Labs 08/26/2015:  AChR binding (0.63) blocking (28) antibody, ESR 3, CRP 4.9  MRI/A head 09/09/2015:  This MR angiogram of the intracerebral arteries shows the following: 1. Very minimal  atherosclerotic change within the left posterior cerebral artery that is unlikely to be clinically significant. 2. There is a normal variant with the left posterior cerebral artery obtaining its flow from the anterior circulation. 3. No aneurysms are seen. 4. No new findings compared to the 11/25/2003 MRA.   MRI orbit wwo contrast 09/09/2015:  This is is a normal MRI of the orbits with and without contrast.  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 She was hospitalized for MG crisis in December 2018, treated with IVIG, did not tolerate prednisone, she is managed with monthly IVIG. Azathioprine was added in February 2019.   Her exam shows mild generalized weakness of the arms and legs. There is no bulbar weakness and neck flexion is 5/5.  Continue IVIG 42m/kg every 3 weeks.  Continue azathioprine 728mtwice daily Check CBC and CMP as her renal function has been getting worse and lymphocyte count was low, and adjustments may need to be made to medications Continue mestinon 6065mID Consider [employer], if she develops new exacerbation  Return to clinic in 3 months   Thank you for allowing me to participate in patient's care.  If I can answer any additional questions, I would be pleased to do so.    Sincerely,    Azhar Knope K. PatPosey ProntoO

## 2018-04-04 ENCOUNTER — Other Ambulatory Visit (INDEPENDENT_AMBULATORY_CARE_PROVIDER_SITE_OTHER): Payer: Medicare Other

## 2018-04-04 ENCOUNTER — Encounter: Payer: Self-pay | Admitting: Neurology

## 2018-04-04 ENCOUNTER — Encounter

## 2018-04-04 ENCOUNTER — Ambulatory Visit (INDEPENDENT_AMBULATORY_CARE_PROVIDER_SITE_OTHER): Payer: Medicare Other | Admitting: Neurology

## 2018-04-04 VITALS — BP 148/80 | HR 90 | Ht 64.0 in | Wt 140.4 lb

## 2018-04-04 DIAGNOSIS — Z79899 Other long term (current) drug therapy: Secondary | ICD-10-CM | POA: Diagnosis not present

## 2018-04-04 DIAGNOSIS — G7001 Myasthenia gravis with (acute) exacerbation: Secondary | ICD-10-CM | POA: Diagnosis not present

## 2018-04-04 LAB — CBC WITH DIFFERENTIAL/PLATELET
BASOS PCT: 1.1 % (ref 0.0–3.0)
Basophils Absolute: 0 10*3/uL (ref 0.0–0.1)
Eosinophils Absolute: 0 10*3/uL (ref 0.0–0.7)
Eosinophils Relative: 0.1 % (ref 0.0–5.0)
HCT: 39.5 % (ref 36.0–46.0)
Hemoglobin: 13.7 g/dL (ref 12.0–15.0)
LYMPHS ABS: 0.8 10*3/uL (ref 0.7–4.0)
Lymphocytes Relative: 18.1 % (ref 12.0–46.0)
MCHC: 34.7 g/dL (ref 30.0–36.0)
MCV: 96.9 fl (ref 78.0–100.0)
Monocytes Absolute: 0.3 10*3/uL (ref 0.1–1.0)
Monocytes Relative: 6.7 % (ref 3.0–12.0)
Neutro Abs: 3.2 10*3/uL (ref 1.4–7.7)
Neutrophils Relative %: 74 % (ref 43.0–77.0)
PLATELETS: 331 10*3/uL (ref 150.0–400.0)
RBC: 4.07 Mil/uL (ref 3.87–5.11)
RDW: 14.7 % (ref 11.5–15.5)
WBC: 4.3 10*3/uL (ref 4.0–10.5)

## 2018-04-04 LAB — COMPREHENSIVE METABOLIC PANEL
ALT: 8 U/L (ref 0–35)
AST: 17 U/L (ref 0–37)
Albumin: 3.7 g/dL (ref 3.5–5.2)
Alkaline Phosphatase: 66 U/L (ref 39–117)
BUN: 16 mg/dL (ref 6–23)
CO2: 31 mEq/L (ref 19–32)
CREATININE: 1.06 mg/dL (ref 0.40–1.20)
Calcium: 9.6 mg/dL (ref 8.4–10.5)
Chloride: 102 mEq/L (ref 96–112)
GFR: 49.84 mL/min — ABNORMAL LOW (ref >60.00)
Glucose, Bld: 76 mg/dL (ref 70–99)
Potassium: 4.1 mEq/L (ref 3.5–5.1)
Sodium: 139 mEq/L (ref 135–145)
Total Bilirubin: 0.5 mg/dL (ref 0.2–1.2)
Total Protein: 8.2 g/dL (ref 6.0–8.3)

## 2018-04-04 NOTE — Patient Instructions (Addendum)
Check labs  Continue medications as you are taking  Return to clinic in 3 months

## 2018-04-07 ENCOUNTER — Ambulatory Visit (INDEPENDENT_AMBULATORY_CARE_PROVIDER_SITE_OTHER): Payer: Medicare Other | Admitting: Internal Medicine

## 2018-04-07 ENCOUNTER — Encounter: Payer: Self-pay | Admitting: Internal Medicine

## 2018-04-07 VITALS — BP 128/76 | HR 85 | Ht 64.0 in | Wt 141.0 lb

## 2018-04-07 DIAGNOSIS — J441 Chronic obstructive pulmonary disease with (acute) exacerbation: Secondary | ICD-10-CM

## 2018-04-07 DIAGNOSIS — J449 Chronic obstructive pulmonary disease, unspecified: Secondary | ICD-10-CM | POA: Diagnosis not present

## 2018-04-07 DIAGNOSIS — G4734 Idiopathic sleep related nonobstructive alveolar hypoventilation: Secondary | ICD-10-CM | POA: Diagnosis not present

## 2018-04-07 MED ORDER — ALBUTEROL SULFATE HFA 108 (90 BASE) MCG/ACT IN AERS: 1.0000 | INHALATION_SPRAY | RESPIRATORY_TRACT | 12 refills | Status: AC | PRN

## 2018-04-07 MED ORDER — GLYCOPYRROLATE-FORMOTEROL 9-4.8 MCG/ACT IN AERO: 2.0000 | INHALATION_SPRAY | Freq: Two times a day (BID) | RESPIRATORY_TRACT | 0 refills | Status: DC

## 2018-04-07 NOTE — Assessment & Plan Note (Signed)
We discussed potential impact of hypoxemia from her COPD on other body function Plan-overnight oximetry

## 2018-04-07 NOTE — Patient Instructions (Signed)
Sample Bevespi inhaler-   Inhale 2 puffs, twice daily maintenance  See if you notice any improvement in shortness of breath  Order- overnight oximetry   Dx nocturnal hypoxemia   Please all if we can help

## 2018-04-07 NOTE — Progress Notes (Signed)
HPI female former smoker followed for COPD, Allergic rhinitis, chronic bronchitis, eustachian tube dysfunction, complicated by GERD, impaired hearing, fibromyalgia, insomnia, Myasthenia Gravis, breast cancer, Office Spirometry 03/16/2016-severe obstructive airways disease FVC 1.59/59%, FEV1 0.98/49%, ratio 0.62, FEF 25-75% 0.42/28% a1AT 07/11/16- WNL 159 MM PFT 04/16/16-severe obstructive airways disease with slight response to bronchodilator, severe diffusion defect. FEV1/FVC 0.64, DLCO 45% HST 09/20/16-AHI 7.6/hour, desaturation to 78% with average 88%, body weight 152 pounds --------------------------------------------------------------------------------------  09/13/16- 83 year old female former smoker followed for COPD, Allergic rhinitis, chronic bronchitis, eustachian tube dysfunction, complicated by GERD/ Nissen, impaired hearing, fibromyalgia, insomnia, Myasthenia Gravis, breast cancer, FOLLOWS FOR: Pt states she has good and bad days-with bad days she is having SOB and wheezing. Pt has questions regarding a BiPAP machine as well.  Hospitalized in May for arthroscopy left knee-torn meniscus Remote history of Barrett's esophagus/fundoplasty Mouth breathing wakes her at night Husband uses CPAP so she is familiar but he has complex health problems and she sleeps alone. Her hearing is bad and she cannot hear if she wheezes. Continues regular use of Anoro, uses rescue inhaler less than once a day. Has nebulizer if needed. PFT 04/16/16-severe obstructive airways disease with slight response to bronchodilator, severe diffusion defect. FEV1/FVC 0.64, DLCO 45% CXR 03/16/16- IMPRESSION: No infiltrate or pulmonary edema. Mild perihilar bronchitic changes with slight worsening from prior exam.  04/07/2018- 83 year old female former smoker followed for COPD, Allergic rhinitis, chronic bronchitis, eustachian tube dysfunction, complicated by GERD/ Nissen, impaired hearing, fibromyalgia, insomnia, Myasthenia  Gravis,  nocturnal hypoxemia Now living at [facility] since husband died a year ago. Pro-air HFA, neb albuterol, Singulair Cough, mostly dyspnea.  Myasthenia gravis currently affecting her legs which further limits ADLs.  Uses her nebulizer machine as needed but mainly in allergy seasons.  Ran out of Anoro inhaler and dropped off, not sure how much difference it made. We discussed nocturnal hypoxemia and are going to reassess with overnight oximetry.  ROS-see HPI   + = positive Constitutional:    weight loss, night sweats, fevers, chills, fatigue, lassitude. HEENT:    headaches, difficulty swallowing, +tooth/dental problems, sore throat,       sneezing, itching, ear ache, nasal congestion, post nasal drip, snoring CV:    chest pain, orthopnea, PND, swelling in lower extremities, anasarca,                                                        dizziness, +palpitations Resp:   shortness of breath with exertion or at rest.               + productive cough,   non-productive cough, coughing up of blood.              change in color of mucus.  + wheezing.   Skin:    rash or lesions. GI: +heartburn, +indigestion, abdominal pain, nausea, vomiting, diarrhea,                 change in bowel habits, loss of appetite GU: dysuria, change in color of urine, no urgency or frequency.   flank pain. MS:   +joint pain, stiffness, decreased range of motion, back pain. Neuro-     +diplopia Psych:  change in mood or affect.  depression or +anxiety.   memory loss.  OBJ- Physical Exam General- Alert, Oriented, Affect-appropriate,  Distress- none acute Skin- rash-none, lesions- none, excoriation- none Lymphadenopathy- none Head- atraumatic            Eyes-             Ears- +HOH            Nose- Clear, no-Septal dev, mucus, polyps, erosion, perforation             Throat- Mallampati III , mucosa clear , drainage- none, tonsils- atrophic Neck- flexible , trachea midline, no stridor , thyroid nl, carotid no  bruit Chest - symmetrical excursion , unlabored           Heart/CV- RRR , no murmur , no gallop  , no rub, nl s1 s2                           - JVD- none , edema- none, stasis changes- none, varices- none           Lung-  wheeze -none , dullness-none, rub- none,  diminished/unlabored            Abd-  Br/ Gen/ Rectal- Not done, not indicated Extrem- cyanosis- none, clubbing, none, atrophy- none, strength- nl, + elastic hose left leg Neuro- grossly intact to observation

## 2018-04-07 NOTE — Assessment & Plan Note (Signed)
Severe COPD but mainly emphysema with limited bronchospasm component that could be addressed with bronchodilators. Plan-try sample Bevespi as a maintenance controller, refill pro-air

## 2018-04-14 DIAGNOSIS — F341 Dysthymic disorder: Secondary | ICD-10-CM | POA: Diagnosis not present

## 2018-04-21 ENCOUNTER — Telehealth: Payer: Self-pay | Admitting: Neurology

## 2018-04-21 NOTE — Telephone Encounter (Signed)
[employer] was calling in about a PA needed for this patient's medication and left vm. Please call. Thanks!

## 2018-04-22 NOTE — Telephone Encounter (Signed)
I called [employer] about patient's Gam[employer]ex and no PA needed.

## 2018-04-23 ENCOUNTER — Encounter: Payer: Self-pay | Admitting: Internal Medicine

## 2018-04-25 DIAGNOSIS — Z6822 Body mass index (BMI) 22.0-22.9, adult: Secondary | ICD-10-CM | POA: Diagnosis not present

## 2018-04-25 DIAGNOSIS — E119 Type 2 diabetes mellitus without complications: Secondary | ICD-10-CM | POA: Diagnosis not present

## 2018-04-25 DIAGNOSIS — G7000 Myasthenia gravis without (acute) exacerbation: Secondary | ICD-10-CM | POA: Diagnosis not present

## 2018-04-25 DIAGNOSIS — F329 Major depressive disorder, single episode, unspecified: Secondary | ICD-10-CM | POA: Diagnosis not present

## 2018-04-25 DIAGNOSIS — Z1331 Encounter for screening for depression: Secondary | ICD-10-CM | POA: Diagnosis not present

## 2018-04-25 DIAGNOSIS — N3010 Interstitial cystitis (chronic) without hematuria: Secondary | ICD-10-CM | POA: Diagnosis not present

## 2018-04-25 DIAGNOSIS — I10 Essential (primary) hypertension: Secondary | ICD-10-CM | POA: Diagnosis not present

## 2018-04-25 DIAGNOSIS — D0511 Intraductal carcinoma in situ of right breast: Secondary | ICD-10-CM | POA: Diagnosis not present

## 2018-04-25 DIAGNOSIS — J42 Unspecified chronic bronchitis: Secondary | ICD-10-CM | POA: Diagnosis not present

## 2018-04-28 ENCOUNTER — Telehealth: Payer: Self-pay | Admitting: Neurology

## 2018-04-28 DIAGNOSIS — F341 Dysthymic disorder: Secondary | ICD-10-CM | POA: Diagnosis not present

## 2018-04-28 NOTE — Telephone Encounter (Signed)
Patient wants to talk to someone about her Infusion last Thursday please call

## 2018-04-28 NOTE — Telephone Encounter (Signed)
Called patient and left message for her to call me back.

## 2018-05-05 DIAGNOSIS — F341 Dysthymic disorder: Secondary | ICD-10-CM | POA: Diagnosis not present

## 2018-05-06 ENCOUNTER — Telehealth: Payer: Self-pay | Admitting: Neurology

## 2018-05-06 NOTE — Telephone Encounter (Signed)
Patient is calling in about an allergic reaction to the IVIG/benedryl. Please call her back at [phone]. Thanks!

## 2018-05-06 NOTE — Telephone Encounter (Signed)
Patient stated that she was having jerking in her legs and itching during her last infusion.  She wasn't sure what had caused it.  I called the infusion nurse and she said that patient did have to get up and walk around during infusion and was having some mild itching but no breaking out or swelling. Nothing had changed as far as medication or manufacturer.  Dr. Posey Pronto aware and Marolyn Hammock instructed to watch patient carefully for any signs of reaction at the next infusion.

## 2018-05-14 ENCOUNTER — Telehealth: Payer: Self-pay | Admitting: Neurology

## 2018-05-14 NOTE — Telephone Encounter (Signed)
Patient's daughter called regarding her mom and having a nurse come in to the Facility today to have her Infusion. She wanted your advise regarding having someone come into the [facility] facility? Please Call. Thanks

## 2018-05-14 NOTE — Telephone Encounter (Signed)
I spoke with patient's daughter and she is worried about someone going in to [facility] to do her mother's infusion.  I informed her that Dr. Posey Pronto said that getting the infusion is fine and instructed her to call [facility] to find out what precautions they are taking at this time due to the Coronavirus.  Also sent Parks Ranger a message requesting for her to have someone reach out to patient's daughter.

## 2018-05-21 ENCOUNTER — Ambulatory Visit (INDEPENDENT_AMBULATORY_CARE_PROVIDER_SITE_OTHER): Payer: Self-pay | Admitting: Orthopaedic Surgery

## 2018-05-29 ENCOUNTER — Telehealth: Payer: Self-pay | Admitting: Neurology

## 2018-05-29 NOTE — Telephone Encounter (Signed)
Yes, that would be fine.  She has been doing really well, so should not be a problem.

## 2018-05-29 NOTE — Telephone Encounter (Signed)
Patient said that she would like to speak with you about infusions. Thanks!

## 2018-05-29 NOTE — Telephone Encounter (Signed)
Patient is concerned about having her infusion next week.  Can she skip one?  Please advise.

## 2018-05-29 NOTE — Telephone Encounter (Signed)
Left message notifying patient that it is ok to skip next week and emailed [facility].

## 2018-06-03 ENCOUNTER — Telehealth: Payer: Self-pay | Admitting: Neurology

## 2018-06-03 NOTE — Telephone Encounter (Signed)
Please advise 

## 2018-06-03 NOTE — Telephone Encounter (Signed)
Patient called regarding her next follow up on 07/07/18. She said she really does not want to come into the office due to the Virus going around and [facility] discouraging them from leaving their apartments. She said she had a few questions: 1. Is there an Oral Medication instead of the Infusion? 2. She is scheduled to have her next infusion on 06/25/18 and she is wondering should she have it? 3. She is also needing to Re apply for [employer] and that she is needing a letter written from Dr. Posey Pronto. She said it will end on 07/18/18. Please Call. Thanks

## 2018-06-03 NOTE — Telephone Encounter (Signed)
Please offer her e-visit in place of her follow-up on 5/11.   No need to do anymore IVIG for now, unless absolutely needed.  She is already on azathioprine 75mg  twice daily which is used to control myasthenia.    Please find out what letter she is referring to.

## 2018-06-04 NOTE — Telephone Encounter (Signed)
Will you schedule her for an e visit?

## 2018-06-05 ENCOUNTER — Ambulatory Visit (INDEPENDENT_AMBULATORY_CARE_PROVIDER_SITE_OTHER): Payer: Self-pay | Admitting: Orthopaedic Surgery

## 2018-06-12 ENCOUNTER — Other Ambulatory Visit: Payer: Self-pay

## 2018-06-12 ENCOUNTER — Encounter: Payer: Self-pay | Admitting: *Deleted

## 2018-06-12 ENCOUNTER — Telehealth (INDEPENDENT_AMBULATORY_CARE_PROVIDER_SITE_OTHER): Payer: Medicare Other | Admitting: Neurology

## 2018-06-12 DIAGNOSIS — G7001 Myasthenia gravis with (acute) exacerbation: Secondary | ICD-10-CM | POA: Diagnosis not present

## 2018-06-12 NOTE — Progress Notes (Signed)
   Virtual Visit via Phone Note The purpose of this virtual visit is to provide medical care while limiting exposure to the novel coronavirus.    Consent was obtained for phone visit:  Yes.   Answered questions that patient had about telehealth interaction:  Yes.   I discussed the limitations, risks, security and privacy concerns of performing an evaluation and management service by telemedicine. I also discussed with the patient that there may be a patient responsible charge related to this service. The patient expressed understanding and agreed to proceed.  Pt location: Home Physician Location: office Name of referring provider:  Prince Solian, MD I connected with [name] at patients initiation/request on 06/12/2018 at  2:00 PM EDT by video enabled telemedicine application and verified that I am speaking with the correct person using two identifiers. Pt MRN:  [id number] Pt DOB:  Sep 03, 1935 Participants:  [name]   History of Present Illness: This is a 83 y.o. female returning for follow-up of myasthenia gravis.  She remains on azathioprine 75mg  twice daily, mestinon 60mg  TID, and monthly IVIG. She has been doing relatively well and due to [place], her facility are limited visitors, therefore, it was mutually decided to hold off on further IVIG for now.  Over the past few weeks, however, she has noticed increased leg weakness making it hard for her to stand up.  Her left eye remains droopy and occasionally, her speech will slur.  She does not have double vision, shortness of breath, or difficulty swallowing.    Assessment and Plan:  Seropositive generalized myasthenia gravis with exacerbation (dx 2017)  - Monthly IVIG started in December 2018 due to near crisis  - Azathioprine was started in 03/2017  - She is unable to tolerate prednisone  - Continue azatioprine 75mg  BID and mestinon 60mg  TID.  - Start IVIG 1mg /kg every 6 weeks   Follow Up Instructions:   I discussed  the assessment and treatment plan with the patient. The patient was provided an opportunity to ask questions and all were answered. The patient agreed with the plan and demonstrated an understanding of the instructions.   The patient was advised to call back or seek an in-person evaluation if the symptoms worsen or if the condition fails to improve as anticipated.  Follow-up in 6 weeks  Total time spent:  25 minutes     Alda Berthold, DO

## 2018-06-13 ENCOUNTER — Telehealth: Payer: Self-pay | Admitting: Neurology

## 2018-06-13 NOTE — Telephone Encounter (Signed)
Patient notified that she will be getting infusion next week.

## 2018-06-13 NOTE — Telephone Encounter (Signed)
Patient called regarding needing to know if she is to have her Infusion next week? Please Call. Thanks

## 2018-06-16 ENCOUNTER — Ambulatory Visit (INDEPENDENT_AMBULATORY_CARE_PROVIDER_SITE_OTHER): Payer: Self-pay | Admitting: Orthopaedic Surgery

## 2018-06-17 ENCOUNTER — Ambulatory Visit (INDEPENDENT_AMBULATORY_CARE_PROVIDER_SITE_OTHER): Payer: Self-pay | Admitting: Orthopaedic Surgery

## 2018-06-23 DIAGNOSIS — F341 Dysthymic disorder: Secondary | ICD-10-CM | POA: Diagnosis not present

## 2018-06-27 ENCOUNTER — Telehealth: Payer: Self-pay | Admitting: Neurology

## 2018-06-27 NOTE — Telephone Encounter (Signed)
Patient left VM about needing infusions every 3 weeks instead of 6 weeks because she is worse. Please give her a call back at [phone]. Thanks!

## 2018-06-27 NOTE — Telephone Encounter (Signed)
Please contact [facility] and set up sooner IVIG.  We can do it every 4 weeks.

## 2018-06-27 NOTE — Telephone Encounter (Signed)
Please advise 

## 2018-06-30 NOTE — Telephone Encounter (Signed)
I have spoken with Erica Drake, Dr. Posey Pronto and patient.  Patient is ok with doing a loading dose over 4 days.  Will send in new order.

## 2018-07-07 ENCOUNTER — Telehealth: Payer: Self-pay | Admitting: Neurology

## 2018-07-07 ENCOUNTER — Ambulatory Visit: Payer: Medicare Other | Admitting: Neurology

## 2018-07-07 NOTE — Telephone Encounter (Signed)
Another round of IVIG in 3 weeks? She just finished her round on Saturday for the 4 days and wants to know if she is supopsed to keep on the every 3 weeks track. Please call her back at [phone]. Also, she asked about the medication Gamagloubin IV infusion Prior Auth needed for her insurance. Thanks!

## 2018-07-08 NOTE — Telephone Encounter (Signed)
Called patient and left message that we will be doing the IVIG every 3 weeks and that her insurance should not need another PA according to [employer].  ([employer] rep)

## 2018-07-10 DIAGNOSIS — I10 Essential (primary) hypertension: Secondary | ICD-10-CM | POA: Diagnosis not present

## 2018-07-10 DIAGNOSIS — E7849 Other hyperlipidemia: Secondary | ICD-10-CM | POA: Diagnosis not present

## 2018-07-10 DIAGNOSIS — E119 Type 2 diabetes mellitus without complications: Secondary | ICD-10-CM | POA: Diagnosis not present

## 2018-07-14 DIAGNOSIS — F341 Dysthymic disorder: Secondary | ICD-10-CM | POA: Diagnosis not present

## 2018-07-17 DIAGNOSIS — I129 Hypertensive chronic kidney disease with stage 1 through stage 4 chronic kidney disease, or unspecified chronic kidney disease: Secondary | ICD-10-CM | POA: Diagnosis not present

## 2018-07-17 DIAGNOSIS — Z1331 Encounter for screening for depression: Secondary | ICD-10-CM | POA: Diagnosis not present

## 2018-07-17 DIAGNOSIS — Z0000 Encounter for general adult medical examination without abnormal findings: Secondary | ICD-10-CM | POA: Diagnosis not present

## 2018-07-17 DIAGNOSIS — L299 Pruritus, unspecified: Secondary | ICD-10-CM | POA: Diagnosis not present

## 2018-07-17 DIAGNOSIS — F329 Major depressive disorder, single episode, unspecified: Secondary | ICD-10-CM | POA: Diagnosis not present

## 2018-07-17 DIAGNOSIS — Z1339 Encounter for screening examination for other mental health and behavioral disorders: Secondary | ICD-10-CM | POA: Diagnosis not present

## 2018-07-17 DIAGNOSIS — D0511 Intraductal carcinoma in situ of right breast: Secondary | ICD-10-CM | POA: Diagnosis not present

## 2018-07-17 DIAGNOSIS — E119 Type 2 diabetes mellitus without complications: Secondary | ICD-10-CM | POA: Diagnosis not present

## 2018-07-17 DIAGNOSIS — K219 Gastro-esophageal reflux disease without esophagitis: Secondary | ICD-10-CM | POA: Diagnosis not present

## 2018-07-17 DIAGNOSIS — N3010 Interstitial cystitis (chronic) without hematuria: Secondary | ICD-10-CM | POA: Diagnosis not present

## 2018-07-17 DIAGNOSIS — N183 Chronic kidney disease, stage 3 (moderate): Secondary | ICD-10-CM | POA: Diagnosis not present

## 2018-07-17 DIAGNOSIS — E785 Hyperlipidemia, unspecified: Secondary | ICD-10-CM | POA: Diagnosis not present

## 2018-07-17 DIAGNOSIS — G7000 Myasthenia gravis without (acute) exacerbation: Secondary | ICD-10-CM | POA: Diagnosis not present

## 2018-07-17 DIAGNOSIS — M797 Fibromyalgia: Secondary | ICD-10-CM | POA: Diagnosis not present

## 2018-07-18 ENCOUNTER — Telehealth: Payer: Self-pay | Admitting: Neurology

## 2018-07-18 ENCOUNTER — Telehealth (INDEPENDENT_AMBULATORY_CARE_PROVIDER_SITE_OTHER): Payer: Medicare Other | Admitting: Neurology

## 2018-07-18 ENCOUNTER — Other Ambulatory Visit: Payer: Self-pay

## 2018-07-18 ENCOUNTER — Encounter: Payer: Self-pay | Admitting: Neurology

## 2018-07-18 VITALS — Ht 63.0 in | Wt 138.0 lb

## 2018-07-18 DIAGNOSIS — G7001 Myasthenia gravis with (acute) exacerbation: Secondary | ICD-10-CM | POA: Diagnosis not present

## 2018-07-18 NOTE — Telephone Encounter (Signed)
Pt c/o feeling fatigued even after IVIG. States that legs are very weak and feel heavy. Pt is having a hard time getting out of a chair.  Her left eye is drooping(not new) and has double vision and time in that eye.  She is currently taking Azathioprine 75mg  daily and Mestinon 60mg  TID. Wants to know if these can be increased? If that would help. Pt's next visit is 09/19/18.

## 2018-07-18 NOTE — Progress Notes (Signed)
Virtual Visit via Video Note The purpose of this virtual visit is to provide medical care while limiting exposure to the novel coronavirus.    Consent was obtained for video visit:  Yes.   Answered questions that patient had about telehealth interaction:  Yes.   I discussed the limitations, risks, security and privacy concerns of performing an evaluation and management service by telemedicine. I also discussed with the patient that there may be a patient responsible charge related to this service. The patient expressed understanding and agreed to proceed.  Pt location: Home Physician Location: office Name of referring provider:  Prince Solian, MD I connected with Erica Drake at patients initiation/request on 07/18/2018 at  3:00 PM EDT by video enabled telemedicine application and verified that I am speaking with the correct person using two identifiers. Pt MRN:  [id number] Pt DOB:  1936/02/10 Video Participants:  Erica Drake   History of Present Illness: This is a 83 y.o. female with seropositive myasthenia gravis here for urgent visit due to progressive bilateral leg pain and weakness.  She received loading dose of IVIG 2 weeks ago and is scheduled to have maintenance therapy on 5/28-5/29.  She describes a lot of knee pain and leg heaviness.  She is able to ambulate with assistance, but feels that she could fall her legs buckle.  She has noticed mild droopiness of the eyelids and blurred vision, very rarely has double vision.  She does not have difficulty with swallowing or talking, or neck heaviness.  She is currently on azathioprine 75 mg twice daily, Mestinon 60 mg 3 times daily, and IVIG as stated above.  She also reports having generalized fatigue and feeling unwell.  She does not have fever, chills, cough, or difficulty with urination.  Observations/Objective:   Vitals:   07/18/18 1456  Weight: 138 lb (62.6 kg)  Height: 5\' 3"  (1.6 m)   Patient is awake, alert, and  appears comfortable.  Oriented x 4.   Extraocular muscles are intact.  Mild bilateral ptosis at baseline and with sustained upgaze.  Face is symmetric, symmetric smile and she is able to keep air puffing cheeks.  Speech is not dysarthric. Tongue is midline. Antigravity in all extremities.    Assessment and Plan:  Seropositive myasthenia gravis with exacerbation  -Increase Mestinon to 90 mg 3 times daily at 10a, 2pm, and 6pm.  GI side effects discussed  -Continue IVIG 1g/kg every 3 weeks  -Continue azathioprine 75mg  twice daily  -Monitor for signs of infection which can cause pseudo-exacerbation  -If she remains weak, Solaris is the next option.  She is intolerant to steroids in the past.   -Pt notified to go to the ER, if she develops signs of MG crisis including shortness of breath, difficulty swallowing/talking, progressive limb weakness  Polyarthralgias, patient informed that myasthenia does not cause pain and joint pain is likely due to arthritis, she has seen orthopedics for this and has received injections, with limited benefit.  Close follow-up in the office in 2-3 weeks    Follow Up Instructions:   I discussed the assessment and treatment plan with the patient. The patient was provided an opportunity to ask questions and all were answered. The patient agreed with the plan and demonstrated an understanding of the instructions.   The patient was advised to call back or seek an in-person evaluation if the symptoms worsen or if the condition fails to improve as anticipated.  Total time spent:  40 minutes  Alda Berthold, DO

## 2018-07-18 NOTE — Telephone Encounter (Signed)
Pt left message with the answering service on 07-18-18 @ 12:54   She would like to speak to someone about her  Concerns and symptoms please call

## 2018-07-18 NOTE — Telephone Encounter (Signed)
Pt as an e visit today

## 2018-07-18 NOTE — Telephone Encounter (Signed)
Please see if she is available to do e-visit today.

## 2018-07-24 NOTE — Assessment & Plan Note (Deleted)
5/24/2019Screening detected right breast calcifications and distortion. The distortion was in the upper outer quadrant: Biopsy DCIS intermediate grade with CSL; calcifications UIQ: 2.8 cm: Biopsy DCIS+ ALH +CSL; 4 cm apart, axilla negative, Tis NX stage 0  Significant delay in decision making because of psychosocial issues and myasthenia gravis. Patient refused surgery and went on Tamoxifen 10 mg daily for 1 month and discontinued it.  Switched to anastrozole half a tablet daily 01/29/2018.  Surveillance:  Mammogram scheduled for 08/15/2018: Because she did not undergo surgery for DCIS, we will perform mammograms every 6 months  Return to clinic in 6 months for follow-up

## 2018-07-29 ENCOUNTER — Encounter: Payer: Self-pay | Admitting: Neurology

## 2018-07-29 ENCOUNTER — Other Ambulatory Visit: Payer: Self-pay

## 2018-07-29 ENCOUNTER — Ambulatory Visit (INDEPENDENT_AMBULATORY_CARE_PROVIDER_SITE_OTHER): Payer: Medicare Other | Admitting: Neurology

## 2018-07-29 VITALS — BP 152/64 | HR 82 | Temp 97.3°F | Ht 63.0 in | Wt 141.0 lb

## 2018-07-29 DIAGNOSIS — G7001 Myasthenia gravis with (acute) exacerbation: Secondary | ICD-10-CM | POA: Diagnosis not present

## 2018-07-29 DIAGNOSIS — F329 Major depressive disorder, single episode, unspecified: Secondary | ICD-10-CM

## 2018-07-29 DIAGNOSIS — F32A Depression, unspecified: Secondary | ICD-10-CM

## 2018-07-29 NOTE — Progress Notes (Signed)
Follow-up Visit   Date: 07/29/18   Erica Drake MRN: [id number] DOB: 1935-12-22   Interim History: Erica Drake is a 83 y.o. female returning to the clinic for follow-up of seropositive myasthenia gravis.  The patient was accompanied to the clinic by self.  Over the past 4-6 weeks, she has been having increased leg weakness and pain. She was also complaining of worsening droopiness of the eyelids and blurred vision.  Therefore, she was loaded with IVIG in early May and continued on maintenance IVIG.  She remains on azathioprine 75mg  daily and her mestinon was increased to 90mg  TID.  Unfortunately, she does not feel that the increase in her Mestinon or IVIG has helped.  She was not offered corticosteroids due to severe intolerance in the past.  She complains of generalized fatigue.  She complains of intermittent difficulty swallowing, diplopia, and blurred vision. There is no shortness of breath.  She denies neck heaviness.  She expresses feelings of isolation because of COVID-19 social distance restrictions.  She no longer engages with any of the other residents at [facility] and feels that this has lowered her overall mood.  She takes Wellbutrin 200 mg twice daily which is prescribed by Dr. Casimiro Needle.  Medications:  Current Outpatient Medications on File Prior to Visit  Medication Sig Dispense Refill  . albuterol (PROAIR HFA) 108 (90 Base) MCG/ACT inhaler Inhale 1-2 puffs into the lungs every 4 (four) hours as needed for wheezing or shortness of breath. 1 Inhaler 12  . albuterol (PROVENTIL) (2.5 MG/3ML) 0.083% nebulizer solution Take 3 mLs (2.5 mg total) by nebulization every 6 (six) hours as needed for wheezing or shortness of breath. 25 vial 5  . anastrozole (ARIMIDEX) 1 MG tablet Take 1 tablet (1 mg total) by mouth daily. 30 tablet 1  . Azathioprine 75 MG TABS Take 1 tablet (75 mg total) by mouth 2 (two) times daily. 180 each 3  . buPROPion (WELLBUTRIN SR) 200 MG 12 hr tablet  Take 200 mg by mouth 2 (two) times daily after a meal.     . clonazePAM (KLONOPIN) 1 MG disintegrating tablet DIS ONE T PO QD PRF PANIC ATTACK    . clonazePAM (KLONOPIN) 1 MG tablet     . dexlansoprazole (DEXILANT) 60 MG capsule Take 60 mg by mouth daily.    Marland Kitchen GAMUNEX-C 5 GM/50ML SOLN   16  . Glycopyrrolate-Formoterol (BEVESPI AEROSPHERE) 9-4.8 MCG/ACT AERO Inhale 2 puffs into the lungs 2 (two) times daily. 2 Inhaler 0  . meclizine (ANTIVERT) 12.5 MG tablet Take 1 tablet (12.5 mg total) by mouth 3 (three) times daily as needed for dizziness. 30 tablet 3  . methocarbamol (ROBAXIN) 500 MG tablet Take 1 tablet (500 mg total) by mouth 4 (four) times daily. As needed for muscle spasm 30 tablet 1  . methylcellulose (CITRUCEL) oral powder Take daily by mouth.    . montelukast (SINGULAIR) 10 MG tablet Take by mouth.    . nitrofurantoin, macrocrystal-monohydrate, (MACROBID) 100 MG capsule     . pyridostigmine (MESTINON) 60 MG tablet Take 1 tablet (60 mg total) by mouth 3 (three) times daily. 270 tablet 3  . trimethoprim (TRIMPEX) 100 MG tablet Take 100 mg by mouth daily.  2   No current facility-administered medications on file prior to visit.     Allergies:  Allergies  Allergen Reactions  . Cortisone Shortness Of Breath, Swelling and Other (See Comments)    Tongue swelling   . Amlodipine Besylate Cough  .  Chlorhexidine Gluconate Itching    Skin on face turned red   . Clindamycin Itching  . Dexamethasone Other (See Comments)    Per MD  . Duloxetine Other (See Comments)    Unknown reaction  . Morphine Sulfate Other (See Comments)    Unknown reaction  . Niacin Other (See Comments)  . Other Other (See Comments)    Pollen - nasal reaction  . Prednisone Other (See Comments)    Causes stomach pain. Sores in her mouth.   . Rosuvastatin Calcium Other (See Comments)    Muscle aches  . Statins Other (See Comments)    Muscle aches  . Sulfa Antibiotics Other (See Comments)    May possibly  have caused deafness in one ear  . Valsartan Other (See Comments)    Unknown reaction  . Strawberry Extract Itching and Rash    Review of Systems:  CONSTITUTIONAL: No fevers, chills, night sweats, or weight loss.  EYES: No visual changes or eye pain ENT: No hearing changes.  No history of nose bleeds.   RESPIRATORY: No cough, wheezing and shortness of breath.   CARDIOVASCULAR: Negative for chest pain, and palpitations.   GI: Negative for abdominal discomfort, blood in stools or black stools.  No recent change in bowel habits.   GU:  No history of incontinence.   MUSCLOSKELETAL: +history of joint pain or swelling.  No myalgias.   SKIN: Negative for lesions, rash, and itching.   ENDOCRINE: Negative for cold or heat intolerance, polydipsia or goiter.   PSYCH:  + depression or anxiety symptoms.   NEURO: As Above.   Vital Signs:  BP (!) 152/64   Pulse 82   Temp (!) 97.3 F (36.3 C)   Ht 5\' 3"  (1.6 m)   Wt 141 lb (64 kg)   SpO2 96%   BMI 24.98 kg/m    General Medical Exam:   General:  Well appearing, comfortable  Eyes/ENT: see cranial nerve examination.   Neck:  No carotid bruits. Respiratory:  Clear to auscultation, good air entry bilaterally.   Cardiac:  Regular rate and rhythm, no murmur.   Ext:  No edema   Neurological Exam: MENTAL STATUS including orientation to time, place, person, recent and remote memory, attention span and concentration, language, and fund of knowledge is normal.  Speech is not dysarthric.  CRANIAL NERVES:  No visual field defects.  Pupils equal round and reactive to light.  Normal conjugate, extra-ocular eye movements in all directions of gaze.  Subtle left ptosis at baseline, without worsening with sustained upgaze.  Face is symmetric. Palate elevates symmetrically.  Tongue is midline, strength is 5/5.  Orbicularis oris, orbicularis oculi, and patient needed is 5-/5.  MOTOR:  Motor strength is 5/5 in the upper extremities and 4+/5 bilateral hip  flexors (?pain-related vs effort due to giveway weakness).  Distally strength is 5/5.  No pronator drift.  Tone is normal.  Neck flexion and extension is 5-/5.    MSRs:  Reflexes are 2+/4 throughout.  COORDINATION/GAIT:  Normal finger-to- nose-finger.  Intact rapid alternating movements bilaterally.  She is unable to stand up without using arms to push off.  Gait appears unsteady with intermittent limping due to left knee pain.    IMPRESSION/PLAN: Seropositive myasthenia gravis with exacerbation, thymoma negative.  MG-ADL score 8.  It is hard to determine whether she is having ongoing activity exacerbation due to myasthenia or pseudo-exacerbation from low mood/depression and knee pain.  I have asked her to follow-up with orthopedics  for management of her left knee pain. Regarding her myasthenia, I will continue her on IVIG 1 g every 21 days, azathioprine 75 mg twice daily, and Mestinon 90 mg 3 times daily.  She has severe intolerance to corticosteroids. I will also start prior authorization for Solaris.  She will need meningococcal vaccine prior to the initiation of this medication. Signs and symptoms of myasthenia crisis were discussed with patient including shortness of breath, increased neck heaviness, difficulty swallowing/talking and worsening limb weakness.  She was instructed to go to the emergency department for these symptoms, where she will need to be excluded for infection and considered for plasmapheresis. There is also worsening depression due to isolation, which may be contributing to her overall sense of poor wellbeing.  I have asked her to follow-up with Dr. Casimiro Needle to see if there are virtual counseling options to help cope with these symptoms.  Return to clinic in 6 weeks  Greater than 50% of this 40 minute visit was spent in counseling, explanation of diagnosis, planning of further management, and coordination of care.   Thank you for allowing me to participate in patient's  care.  If I can answer any additional questions, I would be pleased to do so.    Sincerely,     K. Posey Pronto, DO

## 2018-07-29 NOTE — Patient Instructions (Addendum)
Continue your medications as you are taking  Talk to Dr. Casimiro Needle about feelings of isolation and low mood to see if medication changes are needed or if there are online counseling programs  We will start prior authorization Solaris. They will contact you to arrange meningitis vaccination.

## 2018-07-30 ENCOUNTER — Encounter: Payer: Self-pay | Admitting: Orthopaedic Surgery

## 2018-07-30 ENCOUNTER — Ambulatory Visit (INDEPENDENT_AMBULATORY_CARE_PROVIDER_SITE_OTHER): Payer: Medicare Other | Admitting: Orthopaedic Surgery

## 2018-07-30 ENCOUNTER — Telehealth: Payer: Self-pay

## 2018-07-30 DIAGNOSIS — M1712 Unilateral primary osteoarthritis, left knee: Secondary | ICD-10-CM | POA: Diagnosis not present

## 2018-07-30 DIAGNOSIS — M1711 Unilateral primary osteoarthritis, right knee: Secondary | ICD-10-CM

## 2018-07-30 MED ORDER — METHYLPREDNISOLONE ACETATE 40 MG/ML IJ SUSP
40.0000 mg | INTRAMUSCULAR | Status: AC | PRN
  Administered 2018-07-30: 40 mg via INTRA_ARTICULAR

## 2018-07-30 MED ORDER — LIDOCAINE HCL 1 % IJ SOLN
3.0000 mL | INTRAMUSCULAR | Status: AC | PRN
  Administered 2018-07-30: 3 mL

## 2018-07-30 NOTE — Telephone Encounter (Signed)
Patient was doing research about Myasthenia and she was wondering if some of her other symptoms could be something else.  She is experiencing body stiffness and foot pain in the morning.  Patient was asking if additional blood work should be performed looking at Lupus or anything else.  Please advise.

## 2018-07-30 NOTE — Telephone Encounter (Signed)
I recommend that she discuss this with her PCP who can better determine what testing is indicated.  I agree that stiffness and pain is not due to myasthenia.

## 2018-07-30 NOTE — Telephone Encounter (Signed)
Left message advising patient to contact PCP with additional testing for her concerns.

## 2018-07-30 NOTE — Progress Notes (Signed)
Office Visit Note   Patient: Erica Drake           Date of Birth: 25-Feb-1936           MRN: [id number] Visit Date: 07/30/2018              Requested by: Prince Solian, MD [address] [place], [place] [address] PCP: Prince Solian, MD   Assessment & Plan: Visit Diagnoses:  1. Unilateral primary osteoarthritis, right knee   2. Unilateral primary osteoarthritis, left knee     Plan: I did agree with placing steroid injections in both knees today.  She understands fully the risk benefits of these injections and tolerated them well.  I will see her back in about 6 weeks to see if she would like [place] to go ahead and set her up for a left total knee arthroplasty in September of this year.  All question concerns were answered and addressed.  Follow-Up Instructions: Return in about 6 weeks (around 09/10/2018).   Orders:  Orders Placed This Encounter  Procedures  . Large Joint Inj  . Large Joint Inj   No orders of the defined types were placed in this encounter.     Procedures: Large Joint Inj: R knee on 07/30/2018 1:44 PM Indications: diagnostic evaluation and pain Details: 22 G 1.5 in needle, superolateral approach  Arthrogram: No  Medications: 3 mL lidocaine 1 %; 40 mg methylPREDNISolone acetate 40 MG/ML Outcome: tolerated well, no immediate complications Procedure, treatment alternatives, risks and benefits explained, specific risks discussed. Consent was given by the patient. Immediately prior to procedure a time out was called to verify the correct patient, procedure, equipment, support staff and site/side marked as required. Patient was prepped and draped in the usual sterile fashion.   Large Joint Inj: L knee on 07/30/2018 1:45 PM Indications: diagnostic evaluation and pain Details: 22 G 1.5 in needle, superolateral approach  Arthrogram: No  Medications: 3 mL lidocaine 1 %; 40 mg methylPREDNISolone acetate 40 MG/ML Outcome: tolerated well, no immediate  complications Procedure, treatment alternatives, risks and benefits explained, specific risks discussed. Consent was given by the patient. Immediately prior to procedure a time out was called to verify the correct patient, procedure, equipment, support staff and site/side marked as required. Patient was prepped and draped in the usual sterile fashion.       Clinical Data: No additional findings.   Subjective: Chief Complaint  Patient presents with  . Left Knee - Pain  . Right Knee - Pain  The patient is coming today with known bilateral knee osteoarthritis and degenerative joint disease with the left being worse than the right.  She is 83 years old and very active.  She stays at [facility].  She is in the independent section.  We have provided injections in her knees over the years including steroids and hyaluronic acid.  At this point she wants to consider knee replacement surgery.  She does have a history of myasthenia gravis and fibromyalgia.  She is not on blood thinning medication and not on narcotics.  She does take infusions periodically for her myasthenia gravis.  She would like to have steroid injections both knees today and consider left total knee arthroplasty with very much painful knee may be in September of this year.  Right now if she has her knee replaced they would not allow her back to the skilled nursing section of [facility] so she wants to make sure we get further into the coronavirus  pandemic.  HPI  Review of Systems She currently denies any headache, chest pain, shortness of breath, fever, chills, nausea, vomiting  Objective: Vital Signs: There were no vitals taken for this visit.  Physical Exam She is alert and orient x3 and in no acute distress Ortho Exam Examination of both knees show flexion contractures.  She has moderate effusion of both knees.  She has medial lateral joint line tenderness of both knees.  Both knees are ligamentously stable  and have good range of motion other than lacking full extension.  Both knees have a painful arc of motion.  Both knees have varus malalignment. Specialty Comments:  No specialty comments available.  Imaging: No results found. Previous x-rays show tricompartment arthritis of both knees.  PMFS History: Patient Active Problem List   Diagnosis Date Noted  . Nocturnal hypoxemia 04/07/2018  . Type 2 diabetes mellitus without complication, without long-term current use of insulin ([place]) 12/16/2017  . Depression, major, single episode, complete remission ([place]) 12/16/2017  . Chronic pain of left knee 10/09/2017  . Chronic pain of right knee 10/09/2017  . Unilateral primary osteoarthritis, left knee 10/09/2017  . Unilateral primary osteoarthritis, right knee 10/09/2017  . 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 Myasthenia gravis without acute exacerbation ([place]) 07/16/2017  . H/O arthroscopy 03/06/2017  . Pain 02/20/2017  . Vaginal atrophy 08/16/2016  . Acute medial meniscal tear 07/18/2016  . Acute medial meniscal tear, left, subsequent encounter 07/18/2016  . Renal cyst 03/12/2016  . Asthmatic bronchitis, mild persistent, with acute exacerbation 12/29/2015  . Urethral caruncle 07/26/2015  . Chronic cholecystitis 01/14/2015  . Angioedema 05/23/2014  . Chronic interstitial cystitis 10/01/2011  . INSOMNIA 05/31/2007  . EUSTACHIAN TUBE DYSFUNCTION 04/30/2007  . Seasonal and perennial allergic rhinitis 04/30/2007  . COPD mixed type ([place]) 04/30/2007  . Esophageal reflux 04/30/2007  . Fibromyalgia 04/30/2007   Past Medical History:  Diagnosis Date  . Allergic rhinitis   . Arthritis   . Breast cancer ([place])   . Chronic bronchitis    some mild wheezing at change of weather-  . Chronic fatigue   . Complication of anesthesia    pt reports she always has to be admitted after surgery with pulmon. consult  d/t respiratory history  . COPD mixed type ([place])   . Decreased  hearing    intol hearing aids. Deaf left ear, hearing aid right ear.  . Diabetes mellitus without complication ([place])   . Eustachian tube dysfunction    decreased hearing  . Fibromyalgia   . GERD (gastroesophageal reflux disease)    [place]  . Hx of myasthenia gravis   . Insomnia   . Interstitial cystitis    [facility]-Dr. Karsten Ro sees periodically.(occ. self caths at home)  . Renal cyst   . Vertigo     Family History  Adopted: Yes  Problem Relation Age of Onset  . Asthma Mother        biologic  . Breast cancer Mother        age 68  . Asthma Son   . Stroke Neg Hx   . Neuropathy Neg Hx   . Multiple sclerosis Neg Hx     Past Surgical History:  Procedure Laterality Date  . BREAST CYST ASPIRATION    . CHOLECYSTECTOMY N/A 01/14/2015   Procedure: LAPAROSCOPIC CHOLECYSTECTOMY WITH INTRAOPERATIVE CHOLANGIOGRAM;  Surgeon: Johnathan Hausen, MD;  Location: [facility];  Service: General;  Laterality: N/A;  . KNEE ARTHROSCOPY WITH MEDIAL MENISECTOMY Left  07/18/2016   Procedure: LEFT KNEE ARTHROSCOPY WITH MEDIAL MENISECTOMY dedribement and chrodralplasty;  Surgeon: Gaynelle Arabian, MD;  Location: [facility];  Service: Orthopedics;  Laterality: Left;  . NISSEN FUNDOPLICATION     tx of GERD   Social History   Occupational History  . Not on file  Tobacco Use  . Smoking status: Former Research scientist (life sciences)  . Smokeless tobacco: Former Systems developer    Quit date: 01/09/1974  Substance and Sexual Activity  . Alcohol use: No  . Drug use: No  . Sexual activity: Not on file

## 2018-07-31 ENCOUNTER — Ambulatory Visit: Payer: Medicare Other | Admitting: Hematology and Oncology

## 2018-08-04 ENCOUNTER — Telehealth: Payer: Self-pay | Admitting: Neurology

## 2018-08-04 NOTE — Telephone Encounter (Signed)
Kathlee Nations with [facility] called regarding referral sent for this pt, she needs more information about referral. Pls call her.

## 2018-08-12 ENCOUNTER — Telehealth: Payer: Self-pay | Admitting: Neurology

## 2018-08-12 NOTE — Telephone Encounter (Signed)
Hey yall, Erica Drake is calling in on this patient about needing demographics and confirmation of [facility] labs for the referral that was sent. She said both of yall were working on this. Please fax this info to [phone]. Thanks!

## 2018-08-12 NOTE — Telephone Encounter (Signed)
Demographics faxed to [phone].  Unsure what labwork was needed but faxed recent labs.

## 2018-08-15 ENCOUNTER — Other Ambulatory Visit: Payer: Self-pay | Admitting: Hematology and Oncology

## 2018-08-15 ENCOUNTER — Ambulatory Visit
Admission: RE | Admit: 2018-08-15 | Discharge: 2018-08-15 | Disposition: A | Discharge status: Home / Self Care | Payer: Medicare Other | Source: Ambulatory Visit | Attending: Hematology and Oncology | Admitting: Hematology and Oncology

## 2018-08-15 ENCOUNTER — Other Ambulatory Visit: Payer: Self-pay

## 2018-08-15 DIAGNOSIS — D0511 Intraductal carcinoma in situ of right breast: Secondary | ICD-10-CM

## 2018-08-18 ENCOUNTER — Telehealth: Payer: Self-pay | Admitting: Neurology

## 2018-08-18 NOTE — Telephone Encounter (Signed)
Erica Drake calling again regarding labs, she stated that labs that she received are not the ones that she needs. She needs [facility] labs. Pls call her.

## 2018-08-19 ENCOUNTER — Telehealth: Payer: Self-pay | Admitting: Neurology

## 2018-08-19 NOTE — Telephone Encounter (Signed)
Pt lives at [facility]. If there is a location near there that would work best.  Dr. Posey Pronto,  Where does the pt have Scalaris infusions?

## 2018-08-19 NOTE — Telephone Encounter (Signed)
Labs sent by Dr. Posey Pronto

## 2018-08-19 NOTE — Telephone Encounter (Signed)
Solaris would be administered at [facility] out-patient infusion center/short stay.

## 2018-08-19 NOTE — Telephone Encounter (Signed)
Patient left VM about needing to speak with someone about infusion sites for the scalaris. Please call her back at [phone]. Thanks!

## 2018-08-19 NOTE — Telephone Encounter (Addendum)
Pt informed.   Per Dr. Posey Pronto pt needs to have Meningitis vaccine prior to infusion of Soliris.  Pt is aware of this and will have her [facility] administer it. She will call and let us know the date.  2 weeks after vaccine pt can have the Soliris infusion.

## 2018-08-19 NOTE — Telephone Encounter (Signed)
error 

## 2018-08-20 ENCOUNTER — Telehealth: Payer: Self-pay | Admitting: Neurology

## 2018-08-20 NOTE — Telephone Encounter (Signed)
WBC- 04/04/18 was 4.3. Norm range 4.0-10.5  ACHR- 08/26/2015 was 0.63. Norm range 0.00-0.24. Pt is positive for Myasthenia Gravis.  Left message for pt to return call.  Pt needs vaccine prior to infusion.  The infusion will decrease her immunity and put her at risk to infection.

## 2018-08-20 NOTE — Telephone Encounter (Signed)
Pt called states talked to [facility] yesterday. Pt says before she gets the meningitis shot (2 wks prior to infusion) pt wants to understand safety of getting the shot. Pt request ACHR Factor value (positive or negative)  and WBC level. Call pt [phone]

## 2018-08-20 NOTE — Assessment & Plan Note (Signed)
5/24/2019Screening detected right breast calcifications and distortion. The distortion was in the upper outer quadrant: Biopsy DCIS intermediate grade with CSL; calcifications UIQ: 2.8 cm: Biopsy DCIS+ ALH +CSL; 4 cm apart, axilla negative, Tis NX stage 0  Significant delay in decision making because of psychosocial issues and myasthenia gravis. Patient refused surgery and went on Tamoxifen 10 mg daily for 1 month and discontinued it. Started anastrozole 01/30/1999 19/2 a tablet daily  Anastrozole toxicities:  Breast cancer surveillance: Mammogram 08/15/2018: Biopsy-proven sites of DCIS and ALH are stable, breast density category C  Continue every 78-month mammograms and follow-up.

## 2018-08-25 NOTE — Progress Notes (Signed)
Patient Care Team: Prince Solian, MD as PCP - General (Internal Medicine) Erroll Luna, MD as Consulting Physician (General Surgery) Nicholas Lose, MD as Consulting Physician (Hematology and Oncology) Eppie Gibson, MD as Attending Physician (Radiation Oncology) Shon Hough, MD as Consulting Physician (Ophthalmology) Alda Berthold, DO as Consulting Physician (Neurology)  DIAGNOSIS:    ICD-10-CM   1. Ductal carcinoma in situ (DCIS) of right breast  D05.11     SUMMARY OF ONCOLOGIC HISTORY: Oncology History  Ductal carcinoma in situ (DCIS) of right breast  07/19/2017 Initial Diagnosis   Screening detected right breast calcifications and distortion.  The distortion was in the upper outer quadrant: Biopsy DCIS intermediate grade with CSL; calcifications UIQ: 2.8 cm: Biopsy DCIS+ ALH +CSL; 4 cm apart, axilla negative, Tis NX stage 0   10/30/2017 -  Anti-estrogen oral therapy   Neoadjuvant tamoxifen started 10/30/17, stopped after 4-6weeks due to dry mouth and mouth sores. Switched to anastrozole 01/29/18.     CHIEF COMPLIANT: Follow-up of right breast DCIS on anastrozole  INTERVAL HISTORY: Erica Drake is a 83 y.o. with above-mentioned history of DCIS of the right breast who could not tolerate tamoxifen and is currently on anastrozole. Bone density scan on 02/10/18 showed a T-score of -1.2. Mammogram on 08/15/18 showed the biopsy proven sites of DCIS and ALH in the right breast are stable, and there was no evidence of malignancy in the left breast. She presents to the clinic today for follow-up.   REVIEW OF SYSTEMS:   Constitutional: Denies fevers, chills or abnormal weight loss Eyes: Denies blurriness of vision Ears, nose, mouth, throat, and face: Denies mucositis or sore throat Respiratory: Denies cough, dyspnea or wheezes Cardiovascular: Denies palpitation, chest discomfort Gastrointestinal: Denies nausea, heartburn or change in bowel habits Skin: Denies abnormal skin  rashes Lymphatics: Denies new lymphadenopathy or easy bruising Neurological: Denies numbness, tingling or new weaknesses Behavioral/Psych: Mood is stable, no new changes  Extremities: No lower extremity edema Breast: denies any pain or lumps or nodules in either breasts All other systems were reviewed with the patient and are negative.  I have reviewed the past medical history, past surgical history, social history and family history with the patient and they are unchanged from previous note.  ALLERGIES:  is allergic to cortisone; amlodipine besylate; chlorhexidine gluconate; clindamycin; dexamethasone; duloxetine; morphine sulfate; niacin; other; prednisone; rosuvastatin calcium; statins; sulfa antibiotics; valsartan; and strawberry extract.  MEDICATIONS:  Current Outpatient Medications  Medication Sig Dispense Refill   albuterol (PROAIR HFA) 108 (90 Base) MCG/ACT inhaler Inhale 1-2 puffs into the lungs every 4 (four) hours as needed for wheezing or shortness of breath. 1 Inhaler 12   albuterol (PROVENTIL) (2.5 MG/3ML) 0.083% nebulizer solution Take 3 mLs (2.5 mg total) by nebulization every 6 (six) hours as needed for wheezing or shortness of breath. 25 vial 5   anastrozole (ARIMIDEX) 1 MG tablet Take 1 tablet (1 mg total) by mouth daily. 30 tablet 1   Azathioprine 75 MG TABS Take 1 tablet (75 mg total) by mouth 2 (two) times daily. 180 each 3   buPROPion (WELLBUTRIN SR) 200 MG 12 hr tablet Take 200 mg by mouth 2 (two) times daily after a meal.      clonazePAM (KLONOPIN) 1 MG disintegrating tablet DIS ONE T PO QD PRF PANIC ATTACK     clonazePAM (KLONOPIN) 1 MG tablet      dexlansoprazole (DEXILANT) 60 MG capsule Take 60 mg by mouth daily.     GAMUNEX-C 5 GM/50ML  SOLN   16   Glycopyrrolate-Formoterol (BEVESPI AEROSPHERE) 9-4.8 MCG/ACT AERO Inhale 2 puffs into the lungs 2 (two) times daily. 2 Inhaler 0   meclizine (ANTIVERT) 12.5 MG tablet Take 1 tablet (12.5 mg total) by mouth 3  (three) times daily as needed for dizziness. 30 tablet 3   methocarbamol (ROBAXIN) 500 MG tablet Take 1 tablet (500 mg total) by mouth 4 (four) times daily. As needed for muscle spasm 30 tablet 1   methylcellulose (CITRUCEL) oral powder Take daily by mouth.     montelukast (SINGULAIR) 10 MG tablet Take by mouth.     nitrofurantoin, macrocrystal-monohydrate, (MACROBID) 100 MG capsule      pyridostigmine (MESTINON) 60 MG tablet Take 1 tablet (60 mg total) by mouth 3 (three) times daily. 270 tablet 3   trimethoprim (TRIMPEX) 100 MG tablet Take 100 mg by mouth daily.  2   No current facility-administered medications for this visit.     PHYSICAL EXAMINATION: ECOG PERFORMANCE STATUS: 1 - Symptomatic but completely ambulatory  Vitals:   08/26/18 1430  BP: (!) 147/66  Pulse: 85  Resp: 18  Temp: 98.7 F (37.1 C)  SpO2: 95%   Filed Weights   08/26/18 1430  Weight: 141 lb 14.4 oz (64.4 kg)    GENERAL: alert, no distress and comfortable SKIN: skin color, texture, turgor are normal, no rashes or significant lesions EYES: normal, Conjunctiva are pink and non-injected, sclera clear OROPHARYNX: no exudate, no erythema and lips, buccal mucosa, and tongue normal  NECK: supple, thyroid normal size, non-tender, without nodularity LYMPH: no palpable lymphadenopathy in the cervical, axillary or inguinal LUNGS: clear to auscultation and percussion with normal breathing effort HEART: regular rate & rhythm and no murmurs and no lower extremity edema ABDOMEN: abdomen soft, non-tender and normal bowel sounds MUSCULOSKELETAL: no cyanosis of digits and no clubbing  NEURO: alert & oriented x 3 with fluent speech, no focal motor/sensory deficits EXTREMITIES: No lower extremity edema  LABORATORY DATA:  I have reviewed the data as listed CMP Latest Ref Rng & Units 04/04/2018 01/01/2018 07/31/2017  Glucose 70 - 99 mg/dL 76 93 86  BUN 6 - 23 mg/dL 16 20 15   Creatinine 0.40 - 1.20 mg/dL 1.06 1.08 1.37(H)    Sodium 135 - 145 mEq/L 139 138 135(L)  Potassium 3.5 - 5.1 mEq/L 4.1 4.0 5.3(H)  Chloride 96 - 112 mEq/L 102 103 102  CO2 19 - 32 mEq/L 31 29 27   Calcium 8.4 - 10.5 mg/dL 9.6 9.2 9.2  Total Protein 6.0 - 8.3 g/dL 8.2 8.1 9.4(H)  Total Bilirubin 0.2 - 1.2 mg/dL 0.5 0.3 <0.2(L)  Alkaline Phos 39 - 117 U/L 66 65 60  AST 0 - 37 U/L 17 22 24   ALT 0 - 35 U/L 8 11 9     Lab Results  Component Value Date   WBC 4.3 04/04/2018   HGB 13.7 04/04/2018   HCT 39.5 04/04/2018   MCV 96.9 04/04/2018   PLT 331.0 04/04/2018   NEUTROABS 3.2 04/04/2018    ASSESSMENT & PLAN:  Ductal carcinoma in situ (DCIS) of right breast 5/24/2019Screening detected right breast calcifications and distortion. The distortion was in the upper outer quadrant: Biopsy DCIS intermediate grade with CSL; calcifications UIQ: 2.8 cm: Biopsy DCIS+ ALH +CSL; 4 cm apart, axilla negative, Tis NX stage 0  Significant delay in decision making because of psychosocial issues and myasthenia gravis. Patient refused surgery and went on Tamoxifen 10 mg daily for 1 month and discontinued it. Started anastrozole  01/30/2019 discontinued because it made her throat dry Patient does not want to take any further antiestrogen therapy  Breast cancer surveillance: Mammogram 08/15/2018: Biopsy-proven sites of DCIS and ALH are stable, breast density category C  Continue every 58-month mammograms and follow-up with Doximity video visit.  No orders of the defined types were placed in this encounter.  The patient has a good understanding of the overall plan. she agrees with it. she will call with any problems that may develop before the next visit here.  Nicholas Lose, MD 08/26/2018  Julious Oka Dorshimer am acting as scribe for Dr. Nicholas Lose.  I have reviewed the above documentation for accuracy and completeness, and I agree with the above.

## 2018-08-26 ENCOUNTER — Other Ambulatory Visit: Payer: Self-pay

## 2018-08-26 ENCOUNTER — Inpatient Hospital Stay: Payer: Medicare Other | Attending: Hematology and Oncology | Admitting: Hematology and Oncology

## 2018-08-26 ENCOUNTER — Telehealth: Payer: Self-pay | Admitting: Neurology

## 2018-08-26 DIAGNOSIS — G7000 Myasthenia gravis without (acute) exacerbation: Secondary | ICD-10-CM

## 2018-08-26 DIAGNOSIS — Z79811 Long term (current) use of aromatase inhibitors: Secondary | ICD-10-CM | POA: Diagnosis not present

## 2018-08-26 DIAGNOSIS — Z79899 Other long term (current) drug therapy: Secondary | ICD-10-CM | POA: Diagnosis not present

## 2018-08-26 DIAGNOSIS — D0511 Intraductal carcinoma in situ of right breast: Secondary | ICD-10-CM | POA: Diagnosis not present

## 2018-08-26 NOTE — Telephone Encounter (Signed)
Meningitis shot prescription to the [facility] at [facility] [address]. FAX for pharm: [phone]. And she also had questions about when to take the Solaris and IVIG infusions. Thanks!

## 2018-08-26 NOTE — Telephone Encounter (Signed)
Per Dr Posey Pronto patient can go forward with IVIG infusion.  Order for meningococcal vaccine sent to.  Patient should have Solaris infusion 2 weeks after vaccine.

## 2018-08-27 ENCOUNTER — Telehealth: Payer: Self-pay | Admitting: Hematology and Oncology

## 2018-08-27 NOTE — Telephone Encounter (Signed)
I talk with patient regarding schedule  

## 2018-09-01 NOTE — Telephone Encounter (Signed)
New Message  Kathlee Nations Case Manager from [facility] verbalized she received the vaccination prescriber order form.  Liz Case Manager from [facility] verbalized generally it is for free vaccinations and the patient is not eligible.  Kathlee Nations Case Manager from [facility] will reach out to the patient and let them know they are not eligible but wanted to let Dr. Posey Pronto know.  Please f/u if needed.

## 2018-09-05 ENCOUNTER — Telehealth: Payer: Self-pay | Admitting: Neurology

## 2018-09-05 DIAGNOSIS — G7001 Myasthenia gravis with (acute) exacerbation: Secondary | ICD-10-CM

## 2018-09-05 NOTE — Telephone Encounter (Signed)
OK to send referral for 2nd opinion as requested.

## 2018-09-05 NOTE — Telephone Encounter (Signed)
Left message informing patient neurology referral was faxed.

## 2018-09-05 NOTE — Telephone Encounter (Signed)
Patient left msg with after hours about going on solaris, she would like a referral to Dr. Shelda Pal at [facility]. [phone] for a second opinion. Thanks!

## 2018-09-09 ENCOUNTER — Telehealth: Payer: Self-pay | Admitting: Neurology

## 2018-09-09 NOTE — Telephone Encounter (Signed)
Solaris infusion- where does she go and when does she need to go. Please call her back at [phone]. Thanks!

## 2018-09-09 NOTE — Telephone Encounter (Signed)
Spoke with patient regarding infusion center.  Advised her it could be [facility], [facility] or [facility].  Patient will call her insurance to see which they prefer.

## 2018-09-10 ENCOUNTER — Encounter: Payer: Self-pay | Admitting: Orthopaedic Surgery

## 2018-09-10 ENCOUNTER — Other Ambulatory Visit: Payer: Self-pay

## 2018-09-10 ENCOUNTER — Telehealth: Payer: Self-pay

## 2018-09-10 ENCOUNTER — Ambulatory Visit (INDEPENDENT_AMBULATORY_CARE_PROVIDER_SITE_OTHER): Payer: Medicare Other | Admitting: Orthopaedic Surgery

## 2018-09-10 DIAGNOSIS — M25552 Pain in left hip: Secondary | ICD-10-CM

## 2018-09-10 DIAGNOSIS — M1712 Unilateral primary osteoarthritis, left knee: Secondary | ICD-10-CM

## 2018-09-10 DIAGNOSIS — M25551 Pain in right hip: Secondary | ICD-10-CM

## 2018-09-10 DIAGNOSIS — M1711 Unilateral primary osteoarthritis, right knee: Secondary | ICD-10-CM

## 2018-09-10 NOTE — Telephone Encounter (Signed)
Bilateral knee gel injections 

## 2018-09-10 NOTE — Progress Notes (Signed)
The patient comes in today well-known to me.  She is 83 years old and has a significant history of myasthenia gravis.  We have provided steroid injections in both her knees about a month ago and she states that the injections helped some but she still having some pain.  She has remote history of hyaluronic acid being placed in her knees.  She is interested in that.  She also has bilateral knee pain.  She is not interested in any type of surgical intervention given her myasthenia gravis and the infusion she can be getting in the near future and she says almost throughout her life.  She does report groin pain bilaterally and is interested in injections by Dr. Ernestina Patches in her hips.  She is apparently seen him before.  On exam both hips seem to move fluidly but she grabs at her legs when I move her hips indicating some pain in the groin and not so much pain of the trochanteric area on either side.  Both knees have just mild effusions with slight varus malalignment and patellofemoral crepitation with global medial and lateral tenderness at the joint line.  X-rays have shown osteoarthritis of both knees.  This point we will order hyaluronic acid for both knees because she is not a surgical candidate and hopefully hyaluronic acid can help treat the arthritic pain.  She is interested in bilateral hip steroid injections by Dr. Ernestina Patches under direct fluoroscopy and I do feel that that is appropriate as well.  We will work on getting these things scheduled.  I will see her back myself in 4 weeks.

## 2018-09-12 DIAGNOSIS — F341 Dysthymic disorder: Secondary | ICD-10-CM | POA: Diagnosis not present

## 2018-09-15 NOTE — Telephone Encounter (Signed)
Noted  

## 2018-09-16 ENCOUNTER — Telehealth: Payer: Self-pay

## 2018-09-16 NOTE — Telephone Encounter (Signed)
Submitted VOB for Monovisc, bilateral knee. 

## 2018-09-18 ENCOUNTER — Encounter: Payer: Self-pay | Admitting: Neurology

## 2018-09-18 DIAGNOSIS — K219 Gastro-esophageal reflux disease without esophagitis: Secondary | ICD-10-CM | POA: Diagnosis not present

## 2018-09-18 DIAGNOSIS — G7000 Myasthenia gravis without (acute) exacerbation: Secondary | ICD-10-CM | POA: Diagnosis not present

## 2018-09-18 DIAGNOSIS — R197 Diarrhea, unspecified: Secondary | ICD-10-CM | POA: Diagnosis not present

## 2018-09-18 DIAGNOSIS — M797 Fibromyalgia: Secondary | ICD-10-CM | POA: Diagnosis not present

## 2018-09-18 DIAGNOSIS — K582 Mixed irritable bowel syndrome: Secondary | ICD-10-CM | POA: Diagnosis not present

## 2018-09-18 DIAGNOSIS — D126 Benign neoplasm of colon, unspecified: Secondary | ICD-10-CM | POA: Diagnosis not present

## 2018-09-19 ENCOUNTER — Telehealth (INDEPENDENT_AMBULATORY_CARE_PROVIDER_SITE_OTHER): Payer: Medicare Other | Admitting: Neurology

## 2018-09-19 ENCOUNTER — Other Ambulatory Visit: Payer: Self-pay

## 2018-09-19 VITALS — Ht 63.0 in | Wt 140.0 lb

## 2018-09-19 DIAGNOSIS — G7001 Myasthenia gravis with (acute) exacerbation: Secondary | ICD-10-CM

## 2018-09-19 NOTE — Progress Notes (Signed)
Virtual Visit via Video Note The purpose of this virtual visit is to provide medical care while limiting exposure to the novel coronavirus.    Consent was obtained for video visit:  Yes.   Answered questions that patient had about telehealth interaction:  Yes.   I discussed the limitations, risks, security and privacy concerns of performing an evaluation and management service by telemedicine. I also discussed with the patient that there may be a patient responsible charge related to this service. The patient expressed understanding and agreed to proceed.  Pt location: Home Physician Location: office Name of referring provider:  Prince Solian, MD I connected with Erica Drake at patients initiation/request on 09/19/2018 at  2:30 PM EDT by video enabled telemedicine application and verified that I am speaking with the correct person using two identifiers. Pt MRN:  [id number] Pt DOB:  Dec 20, 1935 Video Participants:  Erica Drake   History of Present Illness: This is a 83 y.o. female returning for follow-up of seropositive myasthenia gravis.  She is complaining of neck heaviness, no shortness of breath, no difficulty swallowing/talking.  She continues to have generalized weakness such as difficulty with standing up from a chair, and needs to use assistance to do this.  She has not had any falls.  She continues to get IVIG every 3 weeks and is on azathioprine 75 mg twice daily.  She is unable to take prednisone due to intolerance and therefore options are quite limited.  At her previous visit, I discussed with her about starting Solaris.  Patient was enrolled in vaccination program, however at this time does not wish to get meningococcal vaccination or do any aggressive therapies.  She lives in independent living at [facility] and does not have anybody to watch her dog, so does not want to go to the hospital.  Further, she is very sedentary and feels that symptoms are only worse if she  exerts herself, which she is not doing.  She denies any illness or signs of infection.    Observations/Objective:   Vitals:   09/18/18 0918  Weight: 140 lb (63.5 kg)  Height: 5\' 3"  (1.6 m)   Patient is awake, alert, and appears comfortable.  Oriented x 4.   Extraocular muscles are intact.  Trace right ptosis.  Face is symmetric.  Speech is not dysarthric. Tongue is midline. Antigravity in all extremities.  Neck flexion and extension range of motion is intact   Assessment and Plan:  Seropositive myasthenia gravis with exacerbation.  Lengthy discussion with patient regarding management options.  She is currently on IVIG 1 mg/kg every 21 days which was recently loaded in May 2020.  She has been on maintenance therapy with this since December 2018 and started on azathioprine in February 2019.  Due to medication intolerance (mouth ulcers), prednisone is not an option for her.  The only other option that she has is to be admitted for plasmapheresis or start Solaris, neither which she is agreeable to proceed with.  She prefers to continue management as she is already doing:  1. IVIG every 3 weeks  2. Azathioprine 75 mg twice daily  3. Mestinon 60mg  TID  Patient was informed that if she develops signs of myasthenic crisis including worsening neck heaviness, shortness of breath, difficulty swallowing/talking, she needs to call 911 to be admitted to the hospital for plasmapheresis. If patient changes her mind about Solaris, she will inform my office.   Follow Up Instructions:   I discussed the assessment and  treatment plan with the patient. The patient was provided an opportunity to ask questions and all were answered. The patient agreed with the plan and demonstrated an understanding of the instructions.   The patient was advised to call back or seek an in-person evaluation if the symptoms worsen or if the condition fails to improve as anticipated.  Follow-up in 2 months  Total time spent:  40  minutes     Alda Berthold, DO

## 2018-09-19 NOTE — Progress Notes (Signed)
Scheduled an appt for sept VV pt notified

## 2018-09-23 ENCOUNTER — Telehealth: Payer: Self-pay | Admitting: Neurology

## 2018-09-23 NOTE — Telephone Encounter (Signed)
Okay to order referral? 

## 2018-09-23 NOTE — Telephone Encounter (Signed)
Pt wants a referral to Dr Berdine Addison at [facility] neuro Fax number is [phone]

## 2018-09-23 NOTE — Telephone Encounter (Signed)
Referral already placed - see telephone note on 7/10.

## 2018-09-24 ENCOUNTER — Telehealth: Payer: Self-pay | Admitting: Neurology

## 2018-09-24 NOTE — Telephone Encounter (Signed)
[employer] is calling in about PA for patient. Please call [phone] option [phone] if needed. They received the PA and the Gamunex was approved for 3 months starting today.

## 2018-09-26 ENCOUNTER — Telehealth: Payer: Self-pay | Admitting: Neurology

## 2018-09-26 NOTE — Telephone Encounter (Signed)
Please inform patient that she is AChR antibody positive based on labs from 08/26/2015.  AChR Binding Ab, Serum 0.00 - 0.24 nmol/L 0.63High

## 2018-09-26 NOTE — Telephone Encounter (Signed)
Patient is calling in about needing ACHR values: for the solaris. Please call her back with this info. Thanks!

## 2018-09-26 NOTE — Telephone Encounter (Signed)
Can you advise on this? I am uncertain of this

## 2018-09-26 NOTE — Telephone Encounter (Signed)
I gave labs values to patient.

## 2018-09-30 ENCOUNTER — Encounter: Payer: Self-pay | Admitting: Physical Medicine and Rehabilitation

## 2018-09-30 ENCOUNTER — Ambulatory Visit (INDEPENDENT_AMBULATORY_CARE_PROVIDER_SITE_OTHER): Payer: Medicare Other | Admitting: Physical Medicine and Rehabilitation

## 2018-09-30 ENCOUNTER — Other Ambulatory Visit: Payer: Self-pay

## 2018-09-30 ENCOUNTER — Telehealth: Payer: Self-pay

## 2018-09-30 ENCOUNTER — Ambulatory Visit: Payer: Self-pay

## 2018-09-30 DIAGNOSIS — M25552 Pain in left hip: Secondary | ICD-10-CM

## 2018-09-30 NOTE — Progress Notes (Signed)
 .  Numeric Pain Rating Scale and Functional Assessment Average Pain 7   In the last MONTH (on 0-10 scale) has pain interfered with the following?  1. General activity like being  able to carry out your everyday physical activities such as walking, climbing stairs, carrying groceries, or moving a chair?  Rating(7)  -Dye Allergies.  

## 2018-09-30 NOTE — Telephone Encounter (Signed)
Approved for Monovisc, bilateral knee. [place] Secondary insurance will pick up remaining eligible expenses at 100% and covers 15% if any expenses not covered by Medicare. No Co-pay No PA required  Appt. 10/08/2018 with Dr. Ninfa Linden

## 2018-09-30 NOTE — Progress Notes (Signed)
   Erica Drake - 83 y.o. female MRN [id number]  Date of birth: 07/27/1935  Office Visit Note: Visit Date: 09/30/2018 PCP: Prince Solian, MD Referred by: Prince Solian, MD  Subjective: Chief Complaint  Patient presents with  . Left Hip - Pain   HPI:  Erica Drake is a 83 y.o. female who comes in today For planned repeat left intra-articular hip injection.  Prior anesthetic hip arthrogram performed in January at the request of Dr. Jean Rosenthal was very beneficial.  Interestingly the x-rays of the hip do not show much in the way of arthritis.  We have also reviewed in the past MRI of her lumbar spine which is really minimal for her age.  Interestingly hip and lumbar spine arthritic changes are very minimal for an 83 year old.  She does state that for 30 years she has had fibromyalgia.  She also suffers from myasthenia gravis for which she sees Dr. Narda Amber at [facility] neurology.  We will go ahead and repeat the left hip injection she is having some groin pain.  I wonder how much of the pain really is underlying exacerbation with the fibromyalgia.  She continues to follow with Dr. Ninfa Linden for her knees as well.  ROS Otherwise per HPI.  Assessment & Plan: Visit Diagnoses: No diagnosis found.  Plan: No additional findings.   Meds & Orders: No orders of the defined types were placed in this encounter.  No orders of the defined types were placed in this encounter.   Follow-up: Return if symptoms worsen or fail to improve.   Procedures: Large Joint Inj: L hip joint on 09/30/2018 12:58 PM Indications: diagnostic evaluation and pain Details: 22 G 3.5 in needle, fluoroscopy-guided anterior approach  Arthrogram: No  Medications: 80 mg triamcinolone acetonide 40 MG/ML; 4 mL bupivacaine 0.25 % Outcome: tolerated well, no immediate complications  There was excellent flow of contrast producing a partial arthrogram of the hip. The patient did have relief of symptoms during  the anesthetic phase of the injection. Procedure, treatment alternatives, risks and benefits explained, specific risks discussed. Consent was given by the patient. Immediately prior to procedure a time out was called to verify the correct patient, procedure, equipment, support staff and site/side marked as required. Patient was prepped and draped in the usual sterile fashion.      No notes on file   Clinical History: No specialty comments available.     Objective:  VS:  HT:    WT:   BMI:     BP:   HR: bpm  TEMP: ( )  RESP:  Physical Exam  Ortho Exam Imaging: No results found.

## 2018-10-01 MED ORDER — TRIAMCINOLONE ACETONIDE 40 MG/ML IJ SUSP
80.0000 mg | INTRAMUSCULAR | Status: AC | PRN
  Administered 2018-09-30: 80 mg via INTRA_ARTICULAR

## 2018-10-01 MED ORDER — BUPIVACAINE HCL 0.25 % IJ SOLN
4.0000 mL | INTRAMUSCULAR | Status: AC | PRN
  Administered 2018-09-30: 4 mL via INTRA_ARTICULAR

## 2018-10-08 ENCOUNTER — Ambulatory Visit (INDEPENDENT_AMBULATORY_CARE_PROVIDER_SITE_OTHER): Payer: Medicare Other | Admitting: Orthopaedic Surgery

## 2018-10-08 ENCOUNTER — Other Ambulatory Visit: Payer: Self-pay

## 2018-10-08 ENCOUNTER — Encounter: Payer: Self-pay | Admitting: Orthopaedic Surgery

## 2018-10-08 DIAGNOSIS — M1711 Unilateral primary osteoarthritis, right knee: Secondary | ICD-10-CM

## 2018-10-08 DIAGNOSIS — M1712 Unilateral primary osteoarthritis, left knee: Secondary | ICD-10-CM | POA: Diagnosis not present

## 2018-10-08 MED ORDER — HYALURONAN 88 MG/4ML IX SOSY
88.0000 mg | PREFILLED_SYRINGE | INTRA_ARTICULAR | Status: AC | PRN
  Administered 2018-10-08: 88 mg via INTRA_ARTICULAR

## 2018-10-08 NOTE — Progress Notes (Signed)
   Procedure Note  Patient: Erica Drake             Date of Birth: 1935/10/30           MRN: [id number]             Visit Date: 10/08/2018  Procedures: Visit Diagnoses:  1. Unilateral primary osteoarthritis, left knee   2. Unilateral primary osteoarthritis, right knee     Large Joint Inj: bilateral knee on 10/08/2018 1:42 PM Indications: diagnostic evaluation and pain Details: 22 G 1.5 in needle, superolateral approach  Arthrogram: No  Medications (Right): 88 mg Hyaluronan 88 MG/4ML Medications (Left): 88 mg Hyaluronan 88 MG/4ML Outcome: tolerated well, no immediate complications Procedure, treatment alternatives, risks and benefits explained, specific risks discussed. Consent was given by the patient. Immediately prior to procedure a time out was called to verify the correct patient, procedure, equipment, support staff and site/side marked as required. Patient was prepped and draped in the usual sterile fashion.    The patient has known osteoarthritis and degenerative joint disease of both her knees.  She is here for scheduled hyaluronic acid injections in both knees today to treat the pain from osteoarthritis.  She has tried and failed other forms of conservative treatment.  She understands the risk and benefits behind these injections.  On exam both knees have slight varus malalignment and mild effusions.  Both knees have painful range of motion and significant medial joint tenderness with patellofemoral crepitation.  I will place Monovisc in both knees today without any difficulty.  All question concerns were answered and addressed.  Follow-up will be as needed.

## 2018-10-13 DIAGNOSIS — G4734 Idiopathic sleep related nonobstructive alveolar hypoventilation: Secondary | ICD-10-CM

## 2018-10-13 NOTE — Telephone Encounter (Signed)
CY please advise on pt email.  Thanks!   Hello Dr. Annamaria Boots,  I am considering flying from [place] to [facility] soon to visit my daughter. (I would be traveling on a chartered flight, and would not pass through an airport or come into contact with other people and will follow all COVID-19 protocols, including wearing an N-95 mask, etc.).   Given my medical conditions and COPD, do you think it would be safe for me to fly on an airplane cross-country?  Thank you,  Jodene Nam  [phone]

## 2018-10-13 NOTE — Telephone Encounter (Signed)
Was overnight oximetry ever done after the 04/07/2018 ov ?- I can't find it. The main concern is with somewhat lower oxygen level in the airplane, since they don't usually maintain cabin pressure at sea-level.  She will probably be ok, but is encouraged not to exert much while in the air. She should move her legs frequently though, and maybe wear support hose, to prevent blood clots from prolonged sitting.

## 2018-10-14 ENCOUNTER — Ambulatory Visit: Payer: Medicare Other | Admitting: Physical Medicine and Rehabilitation

## 2018-10-15 ENCOUNTER — Telehealth: Payer: Self-pay | Admitting: Neurology

## 2018-10-15 NOTE — Telephone Encounter (Signed)
Faxed notes

## 2018-10-15 NOTE — Telephone Encounter (Signed)
Patient said that she has already talked to you about sending info to Dr. Theodosia Blender office. She has an appt with them tomorrow at 10:30AM.  She said it should have been faxed to them at [phone]  And they dont have it yet. Do you know what she is talking about or has this been worked on yet? Thanks!

## 2018-10-16 ENCOUNTER — Telehealth: Payer: Self-pay | Admitting: *Deleted

## 2018-10-16 DIAGNOSIS — G7000 Myasthenia gravis without (acute) exacerbation: Secondary | ICD-10-CM | POA: Diagnosis not present

## 2018-10-16 NOTE — Telephone Encounter (Signed)
Bilateral two level facet block L4-5 and L5-S1

## 2018-10-16 NOTE — Telephone Encounter (Signed)
Called pt and lvm #1 

## 2018-10-17 NOTE — Telephone Encounter (Signed)
Erica Drake please advise patient email, Dr Annamaria Boots will return 10/20/18  "No sleep study has been done since the last one that you mentioned.  When I last saw Dr Annamaria Boots, he said he wanted me to have one but I never got a call and I forgot about having one done.  If you would set me up, I will be glad to go so I will wait to hear from you.  Went to a Garment/textile technologist in [place] who really works out of [employer]. and his nurse said that I certainly was wheezing and did I have my inhaler?  I will wait to hear from you"

## 2018-10-17 NOTE — Telephone Encounter (Signed)
I'm sorry what is she asking me lauren? There a lot going on with these messages

## 2018-10-17 NOTE — Telephone Encounter (Signed)
She is flying to [place] and is concerned if it is safe with COPD> Dr. Annamaria Boots was asking when her last ONO was . Patient says its 04/07/18.I guess Dr. Annamaria Boots wanted her to have a more recent one but it was never done. Does she need to have it done?   Then patient emailed back today asking if she should get a portable air tank to have one hand to use on airplane. She is flying to [place] to see a MD about her MG.   Please advise on sleep study needing to be done and portable oxygen

## 2018-10-17 NOTE — Telephone Encounter (Signed)
Scheduled for 9/8 with driver.

## 2018-10-17 NOTE — Addendum Note (Signed)
Addended by: Amado Coe on: 10/17/2018 04:52 PM   Modules accepted: Orders

## 2018-10-17 NOTE — Telephone Encounter (Signed)
She needs to have one done at some point but I dont think its necessary for her trip. Is she on oxygen? If she wants a portable air tank we can place order

## 2018-10-20 ENCOUNTER — Telehealth: Payer: Self-pay | Admitting: Internal Medicine

## 2018-10-20 NOTE — Telephone Encounter (Signed)
Spoke with pt. She has been scheduled for her flu shot on 11/05/2018 at 1100. Nothing further was needed.

## 2018-10-22 NOTE — Telephone Encounter (Signed)
Patient never answered question about whether or not she already has O2 at home and is apparently trying to see if the kept an old portable tank that belonged to he spouse before he passed.  Question sent to patient again.

## 2018-10-24 DIAGNOSIS — J449 Chronic obstructive pulmonary disease, unspecified: Secondary | ICD-10-CM | POA: Diagnosis not present

## 2018-10-24 DIAGNOSIS — R0902 Hypoxemia: Secondary | ICD-10-CM | POA: Diagnosis not present

## 2018-10-27 ENCOUNTER — Telehealth: Payer: Self-pay | Admitting: Primary Care

## 2018-10-27 DIAGNOSIS — G4734 Idiopathic sleep related nonobstructive alveolar hypoventilation: Secondary | ICD-10-CM

## 2018-10-27 NOTE — Telephone Encounter (Signed)
ONO on room air showed O2 <88% for 56 mins. Baseline oxygen level was 90% and low was 65%.   She qualifies for nocturnal oxygen. Is she already on? Looks like she was trying to use her husbands tank that was in storage. Please refer for new oxygen start, needs to wear 2L at night. Needs follow-up in office with Dr. Annamaria Boots or NP in 2-4 weeks.

## 2018-10-27 NOTE — Telephone Encounter (Signed)
Called and spoke with patient. She states her husband used to use a POC and it might be in storage, she has to check. [facility] used [facility]  I let herk now they have changed their name. I explained to her she would need more of a free standing larger concentrator for night time oxygen. She states she just moved to [facility] and needs to talk to them about their home health company and accommodation's and she will call us back. Also needs to talk to someone about transportation to the office for an appointment.   No order was placed no DME company to order from. Will leave in triage to call patient back or follow up when patient calls back

## 2018-10-28 NOTE — Telephone Encounter (Addendum)
Called and spoke with patient. Pt's main concern is if she will need oxygen for her trip to [place] to see a Myasthenia gravis specialist discovered by her daughter. Pt states she will be travelling in a private jet and practicing precautions. She is inquiring about how the altitude will affect her oxygen levels, and if she will need supplemental oxygen.  Pt also inquires since she has myasthenia gravis, which affects her head/neck posture, if her breathing and oxygen levels are affected.    Pt last seen 04/07/2018, where an ONO was ordered. Pt states this ONO was never performed, likely d/t COVID restrictions. According to lengthy MyChart encounter from 10/13/2018, BW ordered another ONO on 10/17/2018. Pt states she completed this and returned the supplies to [facility] last Friday 10/24/2018. BW resulted ONO in previous documentation. I reinforced this to her and let her know I placed an order to [facility] for new nocturnal O2 start. I have also scheduled an follow-up appt w/ CY 11/19/2018 at 11:30 AM EDT per Beth.   Routing this CY for follow up on the patient's previous questions. CY, please advise.

## 2018-10-28 NOTE — Telephone Encounter (Signed)
Pt returning call.  [phone].

## 2018-10-28 NOTE — Telephone Encounter (Signed)
Patient is returning phone call.  Patient phone number is [phone].

## 2018-10-29 ENCOUNTER — Telehealth: Payer: Self-pay | Admitting: Internal Medicine

## 2018-10-29 ENCOUNTER — Ambulatory Visit (INDEPENDENT_AMBULATORY_CARE_PROVIDER_SITE_OTHER): Payer: Medicare Other | Admitting: Internal Medicine

## 2018-10-29 ENCOUNTER — Encounter: Payer: Self-pay | Admitting: Internal Medicine

## 2018-10-29 ENCOUNTER — Other Ambulatory Visit: Payer: Self-pay

## 2018-10-29 DIAGNOSIS — G4734 Idiopathic sleep related nonobstructive alveolar hypoventilation: Secondary | ICD-10-CM

## 2018-10-29 DIAGNOSIS — J449 Chronic obstructive pulmonary disease, unspecified: Secondary | ICD-10-CM | POA: Diagnosis not present

## 2018-10-29 NOTE — Telephone Encounter (Signed)
Pt has a televisit appt at 9:30 this morning.  Requested a sooner appt.

## 2018-10-29 NOTE — Progress Notes (Signed)
HPI female former smoker followed for COPD, Allergic rhinitis, chronic bronchitis, eustachian tube dysfunction, complicated by GERD, impaired hearing, fibromyalgia, insomnia, Myasthenia Gravis, breast cancer, Office Spirometry 03/16/2016-severe obstructive airways disease FVC 1.59/59%, FEV1 0.98/49%, ratio 0.62, FEF 25-75% 0.42/28% a1AT 07/11/16- WNL 159 MM PFT 04/16/16-severe obstructive airways disease with slight response to bronchodilator, severe diffusion defect. FEV1/FVC 0.64, DLCO 45% HST 09/20/16-AHI 7.6/hour, desaturation to 78% with average 88%, body weight 152 pounds --------------------------------------------------------------------------------------  04/07/2018- 83 year old female former smoker followed for COPD, Allergic rhinitis, chronic bronchitis, eustachian tube dysfunction, complicated by GERD/ Nissen, impaired hearing, fibromyalgia, insomnia, Myasthenia Gravis,  nocturnal hypoxemia Now living at [facility] since husband died a year ago. Pro-air HFA, neb albuterol, Singulair Cough, mostly dyspnea.  Myasthenia gravis currently affecting her legs which further limits ADLs.  Uses her nebulizer machine as needed but mainly in allergy seasons.  Ran out of Anoro inhaler and dropped off, not sure how much difference it made. We discussed nocturnal hypoxemia and are going to reassess with overnight oximetry.  10/29/2018- Virtual Visit via Telephone Note  I connected with [name] on 10/29/18 at  9:30 AM EDT by telephone and verified that I am speaking with the correct person using two identifiers.  Location: Patient: H Provider: O   I discussed the limitations, risks, security and privacy concerns of performing an evaluation and management service by telephone and the availability of in person appointments. I also discussed with the patient that there may be a patient responsible charge related to this service. The patient expressed understanding and agreed to  proceed.   History of Present Illness: 83 year old female former smoker followed for COPD, Nocturnal Hypoxemia, Allergic rhinitis, chronic bronchitis, eustachian tube dysfunction, complicated by GERD/ Nissen, impaired hearing, fibromyalgia, insomnia, Myasthenia Gravis,  Now living at [facility] since husband died a year ago. Pro-air HFA, neb albuterol, Singulair, Breo  ONOX on room air Sat 88% or less 56 minutes, qualifying for O2 during sleep. -----pt needs this OV in order for her insurance to cover her nocturnal oxygen per [facility]; taking Bevespi PRN Plans to fly to [place] to see a Myasthenia Gravis specialist, encouraged by her daughter.  Covid careful. Discussed flu shot- get any time now.    Observations/Objective: ONOX on room air Sat 88% or less 56 minutes, qualifying for O2 during sleep.  Assessment and Plan: Nocturnal hypoxemia- hypoxic resp failure- qualifies for O2 during sleep. Should do ok flying, esp while awake- discussed. COPD- continue Bevespi  Follow Up Instructions: Keep February appt  Don't forget Flu vax  I discussed the assessment and treatment plan with the patient. The patient was provided an opportunity to ask questions and all were answered. The patient agreed with the plan and demonstrated an understanding of the instructions.   The patient was advised to call back or seek an in-person evaluation if the symptoms worsen or if the condition fails to improve as anticipated.  I provided 21 minutes of non-face-to-face time during this encounter.   Baird Lyons, MD    ROS-see HPI   + = positive Constitutional:    weight loss, night sweats, fevers, chills, fatigue, lassitude. HEENT:    headaches, difficulty swallowing, +tooth/dental problems, sore throat,       sneezing, itching, ear ache, nasal congestion, post nasal drip, snoring CV:    chest pain, orthopnea, PND, swelling in lower extremities, anasarca,  dizziness, +palpitations Resp:   shortness of breath with exertion or at rest.               + productive cough,   non-productive cough, coughing up of blood.              change in color of mucus.  + wheezing.   Skin:    rash or lesions. GI: +heartburn, +indigestion, abdominal pain, nausea, vomiting, diarrhea,                 change in bowel habits, loss of appetite GU: dysuria, change in color of urine, no urgency or frequency.   flank pain. MS:   +joint pain, stiffness, decreased range of motion, back pain. Neuro-     +diplopia Psych:  change in mood or affect.  depression or +anxiety.   memory loss.  OBJ- Physical Exam General- Alert, Oriented, Affect-appropriate, Distress- none acute Skin- rash-none, lesions- none, excoriation- none Lymphadenopathy- none Head- atraumatic            Eyes-             Ears- +HOH            Nose- Clear, no-Septal dev, mucus, polyps, erosion, perforation             Throat- Mallampati III , mucosa clear , drainage- none, tonsils- atrophic Neck- flexible , trachea midline, no stridor , thyroid nl, carotid no bruit Chest - symmetrical excursion , unlabored           Heart/CV- RRR , no murmur , no gallop  , no rub, nl s1 s2                           - JVD- none , edema- none, stasis changes- none, varices- none           Lung-  wheeze -none , dullness-none, rub- none,  diminished/unlabored            Abd-  Br/ Gen/ Rectal- Not done, not indicated Extrem- cyanosis- none, clubbing, none, atrophy- none, strength- nl, + elastic hose left leg Neuro- grossly intact to observation

## 2018-10-29 NOTE — Patient Instructions (Signed)
Ordering O2 2L during sleep - [facility] at [facility]     Dx COPD mixed typed, Nocturnal Hypoxemia  Keep scheduled appointment with [place] in February

## 2018-10-29 NOTE — Telephone Encounter (Signed)
Spoke with pt. She is needing an OV in order for Medicare to pay for her nocturnal oxygen. This has already been scheduled. Nothing further is needed.

## 2018-10-29 NOTE — Telephone Encounter (Signed)
Will route message to Annie Paras, LPN to make her aware.

## 2018-10-29 NOTE — Telephone Encounter (Signed)
Pt called back asking for Lauren only, calling regarding oxygen and the sleep numbers that were given to her.  [phone].  See previous notes.

## 2018-10-30 ENCOUNTER — Telehealth: Payer: Self-pay | Admitting: Internal Medicine

## 2018-10-30 DIAGNOSIS — G4733 Obstructive sleep apnea (adult) (pediatric): Secondary | ICD-10-CM

## 2018-10-30 NOTE — Telephone Encounter (Signed)
Called and spoke with [facility] rep to return Angela's call. [facility] rep I spoke with stated Levada Dy was away from her desk; however, that she would make sure the [place] note from pt's televisit 10/29/2018 was placed to Angela's attention as [facility] has access to pt's chart. She further noted if they needed [place] to fax the notes she would give our office a call back. Nothing further needed at this time.

## 2018-10-30 NOTE — Telephone Encounter (Signed)
Called and spoke with pt regarding CY's recommendations below. Pt verbalized understanding and states she would prefer to have the HST appointment on 11/05/2018, the day she comes in for her flu vaccine, in order to save her a trip. I let her know I would put this in the order. Pt expressed understanding.   Order for home sleep test has been placed. Nothing further needed at this time.

## 2018-10-30 NOTE — Telephone Encounter (Signed)
Levada Dy from [place] states pt's insurance requires pt to have a titrated sleep study since pt had a diagnosis of OSA in the past. Pt's last home sleep test was performed 09/20/2016.   CY, please advise with your recommendations. Thank you.

## 2018-10-30 NOTE — Telephone Encounter (Signed)
This is a Medicare requirement. Because she had a diagnosis of mild sleep apnea in 2018, the home care company can't provide oxygen during sleep unless we can show that she no longer has sleep apnea.  Order- home sleep test  for dx OSA

## 2018-10-31 ENCOUNTER — Other Ambulatory Visit: Payer: Self-pay | Admitting: Neurology

## 2018-10-31 DIAGNOSIS — N3941 Urge incontinence: Secondary | ICD-10-CM | POA: Diagnosis not present

## 2018-10-31 DIAGNOSIS — Z87440 Personal history of urinary (tract) infections: Secondary | ICD-10-CM | POA: Diagnosis not present

## 2018-10-31 DIAGNOSIS — N952 Postmenopausal atrophic vaginitis: Secondary | ICD-10-CM | POA: Diagnosis not present

## 2018-10-31 DIAGNOSIS — N3010 Interstitial cystitis (chronic) without hematuria: Secondary | ICD-10-CM | POA: Diagnosis not present

## 2018-11-04 ENCOUNTER — Ambulatory Visit (INDEPENDENT_AMBULATORY_CARE_PROVIDER_SITE_OTHER): Payer: Medicare Other | Admitting: Physical Medicine and Rehabilitation

## 2018-11-04 ENCOUNTER — Encounter: Payer: Self-pay | Admitting: Physical Medicine and Rehabilitation

## 2018-11-04 ENCOUNTER — Ambulatory Visit: Payer: Self-pay

## 2018-11-04 VITALS — BP 156/75 | HR 83

## 2018-11-04 DIAGNOSIS — M47816 Spondylosis without myelopathy or radiculopathy, lumbar region: Secondary | ICD-10-CM | POA: Diagnosis not present

## 2018-11-04 DIAGNOSIS — M797 Fibromyalgia: Secondary | ICD-10-CM

## 2018-11-04 DIAGNOSIS — M25552 Pain in left hip: Secondary | ICD-10-CM

## 2018-11-04 DIAGNOSIS — M545 Low back pain: Secondary | ICD-10-CM

## 2018-11-04 DIAGNOSIS — G8929 Other chronic pain: Secondary | ICD-10-CM | POA: Diagnosis not present

## 2018-11-04 MED ORDER — METHYLPREDNISOLONE ACETATE 80 MG/ML IJ SUSP: 80.0000 mg | Freq: Once | INTRAMUSCULAR | Status: DC

## 2018-11-04 NOTE — Progress Notes (Signed)
 .  Numeric Pain Rating Scale and Functional Assessment Average Pain 5   In the last MONTH (on 0-10 scale) has pain interfered with the following?  1. General activity like being  able to carry out your everyday physical activities such as walking, climbing stairs, carrying groceries, or moving a chair?  Rating(6)   +Driver, -BT, -Dye Allergies.  

## 2018-11-05 ENCOUNTER — Ambulatory Visit: Payer: Medicare Other

## 2018-11-05 ENCOUNTER — Other Ambulatory Visit: Payer: Self-pay

## 2018-11-05 ENCOUNTER — Ambulatory Visit (INDEPENDENT_AMBULATORY_CARE_PROVIDER_SITE_OTHER): Payer: Medicare Other

## 2018-11-05 DIAGNOSIS — G4733 Obstructive sleep apnea (adult) (pediatric): Secondary | ICD-10-CM

## 2018-11-05 DIAGNOSIS — Z23 Encounter for immunization: Secondary | ICD-10-CM

## 2018-11-06 DIAGNOSIS — G4733 Obstructive sleep apnea (adult) (pediatric): Secondary | ICD-10-CM | POA: Diagnosis not present

## 2018-11-08 ENCOUNTER — Encounter: Payer: Self-pay | Admitting: Internal Medicine

## 2018-11-12 ENCOUNTER — Telehealth: Payer: Self-pay

## 2018-11-12 NOTE — Telephone Encounter (Signed)
Called spoke with patient who is requesting her HST results.  Patient is unsure if she will need O2 at bedtime or CPAP or both.  Patient did express some confusion about whether or not she needs another office visit per [facility] - informed patient that we have not received any calls from [facility] stating that another office visit is needed and that her 9.2.2020 visit should be sufficient.  Patient voiced her understanding.  While speaking with patient she did also mention having some increased SHOB 1 week ago that required frequent use of her ProAir HFA but symptoms have not persisted.  She wonders if that may have been related to her Myasthenia Gravis.  Dr Annamaria Boots, please advise on HST results.  Thank you.

## 2018-11-13 ENCOUNTER — Telehealth: Payer: Self-pay | Admitting: Internal Medicine

## 2018-11-13 DIAGNOSIS — G7000 Myasthenia gravis without (acute) exacerbation: Secondary | ICD-10-CM | POA: Diagnosis not present

## 2018-11-13 DIAGNOSIS — W1830XA Fall on same level, unspecified, initial encounter: Secondary | ICD-10-CM | POA: Diagnosis not present

## 2018-11-13 DIAGNOSIS — G4733 Obstructive sleep apnea (adult) (pediatric): Secondary | ICD-10-CM | POA: Diagnosis not present

## 2018-11-13 DIAGNOSIS — L0889 Other specified local infections of the skin and subcutaneous tissue: Secondary | ICD-10-CM | POA: Diagnosis not present

## 2018-11-13 NOTE — Telephone Encounter (Signed)
LVMTCB x 1 for patient regarding sleep results.

## 2018-11-13 NOTE — Telephone Encounter (Signed)
ATC, NA and line was busy

## 2018-11-13 NOTE — Telephone Encounter (Signed)
Error.  Telephone message already opened.  Copied & pasted this into open telephone encounter.

## 2018-11-13 NOTE — Telephone Encounter (Signed)
The home sleep test did not show significant sleep apnea. Her score was 3 apneas/ hour, which is within normal limits. Her oxygen level did get a little low sometimes. She can continue to oxygen during sleep as per our previous discussions.

## 2018-11-13 NOTE — Telephone Encounter (Signed)
Erica Drake to [place], [place], [place]   \   11/13/18 2:49 PM Pt returning call that she missed and would like a call back at [phone]

## 2018-11-14 ENCOUNTER — Telehealth: Payer: Self-pay | Admitting: Internal Medicine

## 2018-11-14 DIAGNOSIS — G4734 Idiopathic sleep related nonobstructive alveolar hypoventilation: Secondary | ICD-10-CM

## 2018-11-14 NOTE — Telephone Encounter (Signed)
Called spoke with patient. Let her know the results of sleep study, and put in order for new o2. Nothing further at this time.

## 2018-11-14 NOTE — Telephone Encounter (Signed)
Pt returning call for test results. °

## 2018-11-18 DIAGNOSIS — Z85828 Personal history of other malignant neoplasm of skin: Secondary | ICD-10-CM | POA: Diagnosis not present

## 2018-11-18 DIAGNOSIS — L02425 Furuncle of right lower limb: Secondary | ICD-10-CM | POA: Diagnosis not present

## 2018-11-19 ENCOUNTER — Telehealth (INDEPENDENT_AMBULATORY_CARE_PROVIDER_SITE_OTHER): Payer: Medicare Other | Admitting: Neurology

## 2018-11-19 ENCOUNTER — Telehealth: Payer: Self-pay | Admitting: Neurology

## 2018-11-19 ENCOUNTER — Ambulatory Visit: Payer: Medicare Other | Admitting: Internal Medicine

## 2018-11-19 ENCOUNTER — Other Ambulatory Visit: Payer: Self-pay

## 2018-11-19 DIAGNOSIS — G7001 Myasthenia gravis with (acute) exacerbation: Secondary | ICD-10-CM

## 2018-11-19 NOTE — Telephone Encounter (Signed)
Called patient's daughter who had many questions about medication management for patient.  We discussed options of Solaris or admission to hospital for plasmapheresis, both which patient has been reluctant to proceed with in the past. Daughter would like for patient to move to [place], [place] as she will be closer and has been looking at options there.  Social interaction is very important for patient's well-being and unfortunately due to [place], her social engagement has been drastically limited. I do not recommend that patient travel at this time, as she is not at optimal medical condition to travel safely.  I will be seeing patient in the office to better assess her. All questions were answered.  Semaj Coburn K. Posey Pronto, DO

## 2018-11-19 NOTE — Telephone Encounter (Signed)
Can you advise on this

## 2018-11-19 NOTE — Telephone Encounter (Signed)
Patient's daughter, Serendipity Thackeray, lives in [place] and tried to participate in the DOXY.ME call this morning with Dr. Posey Pronto to no avail. She'd like to know if Dr. Posey Pronto is willing to call her directly for a short chat about her mother.

## 2018-11-19 NOTE — Progress Notes (Signed)
Virtual Visit via Telephone Note The purpose of this virtual visit is to provide medical care while limiting exposure to the novel coronavirus.    Consent was obtained for phone visit:  Yes.   Answered questions that patient had about telehealth interaction:  Yes.   I discussed the limitations, risks, security and privacy concerns of performing an evaluation and management service by telephone. I also discussed with the patient that there may be a patient responsible charge related to this service. The patient expressed understanding and agreed to proceed.  Pt location: Home Physician Location: office Name of referring provider:  Prince Solian, MD I connected with .Erica Drake at patients initiation/request on 11/19/2018 at 10:30 AM EDT by telephone and verified that I am speaking with the correct person using two identifiers.  Pt MRN:  [id number] Pt DOB:  08/05/1935   History of Present Illness: This is a 83 year old female returning for evaluation of seropositive myasthenia gravis.  Patient was unable to connect video for televisit, so it was transitioned to telephone.  Her daughter was also available for part of the visit, but due to connectivity issues, I called her daughter later in the day to update and answer questions.  Overall, patient continues to have generalized fatigue and feels that her eyelids are getting more droopy.  She feels that her legs are weaker and her neck is feeling weaker again.  She was pulmonology who started her on nocturnal oxygen.  She lives alone and is worried about going to the hospital, because she has no one to care for her dog.    Observations/Objective:  Speech sounds clear, no dysarthria.  She is able to complete full sentences and does not sound short of breath.   Assessment and Plan:   Seropositive myasthenia gravis with exacerbation Despite being on IVIG every 3 weeks and azathioprine 75mg  BID, she continues to have exacerbation of  symptoms Recommend that she go to the ER for admission to the hospital for plasmapheresis, which will be the quickest way to manage her symptoms She is approved for Solaris, but has been reluctant to start this.  She was made aware that she needs to get meningococcal vaccination and Solaris can only be started 2 weeks after this has been administered, so this would be a better long-term medication, but in the acute period, plasmapheresis will be most effective Unfortunately, she did not tolerate corticosteroids and does not want to take this again (oral ulcers) After long discussion of management options, patient decided that she would rather see me in the office and make a decision after being evaluated.  I, again, counseled patient that if she develops worsening symptoms, shortness of breath, neck heaviness, or slurred speech, to call 911 or go to ER.   Follow Up Instructions:   I discussed the assessment and treatment plan with the patient. The patient was provided an opportunity to ask questions and all were answered. The patient agreed with the plan and demonstrated an understanding of the instructions.   The patient was advised to call back or seek an in-person evaluation if the symptoms worsen or if the condition fails to improve as anticipated.  My office will contact patient to schedule asap appointment  Total Time spent in visit with the patient was:  45 min, of which 100% of the time was spent in counseling and/or coordinating care with patient, of which 23-min were spent talking to daughter on separate telephone call updating her of patient's  care.   Pt understands and agrees with the plan of care outlined.     Alda Berthold, DO

## 2018-11-20 ENCOUNTER — Telehealth: Payer: Self-pay

## 2018-11-20 ENCOUNTER — Telehealth: Payer: Self-pay | Admitting: Neurology

## 2018-11-20 NOTE — Telephone Encounter (Signed)
Called and left patient a message to call back re: Dr. Posey Pronto visit.  Please contact patient to set up in-office follow-up - ok to add on Friday at 11:10a if patient is available, as I will need longer time with her. Thanks

## 2018-11-20 NOTE — Telephone Encounter (Signed)
Patient is scheduled for follow up with Dr. Posey Pronto tomorrow at 11:10 AM.   Patient has a question she'd like answered, if possible, today. She'd like to know whether she should proceed with her IVIG infusion next week.

## 2018-11-20 NOTE — Telephone Encounter (Signed)
Please advise 

## 2018-11-20 NOTE — Telephone Encounter (Signed)
Patient returned call and scheduled for 11/21/2018 at 11:10 AM.

## 2018-11-20 NOTE — Telephone Encounter (Signed)
Yes, continue all medications as she is currently taking.

## 2018-11-20 NOTE — Telephone Encounter (Signed)
Line busy at  253

## 2018-11-20 NOTE — Telephone Encounter (Signed)
Close encounter 

## 2018-11-21 ENCOUNTER — Telehealth: Payer: Medicare Other | Admitting: Neurology

## 2018-11-21 ENCOUNTER — Other Ambulatory Visit: Payer: Self-pay

## 2018-11-21 ENCOUNTER — Encounter: Payer: Self-pay | Admitting: Neurology

## 2018-11-21 ENCOUNTER — Telehealth: Payer: Self-pay

## 2018-11-21 ENCOUNTER — Telehealth: Payer: Self-pay | Admitting: Neurology

## 2018-11-21 ENCOUNTER — Ambulatory Visit (INDEPENDENT_AMBULATORY_CARE_PROVIDER_SITE_OTHER): Payer: Medicare Other | Admitting: Neurology

## 2018-11-21 VITALS — BP 132/62 | HR 70 | Ht 63.0 in | Wt 143.0 lb

## 2018-11-21 DIAGNOSIS — G7001 Myasthenia gravis with (acute) exacerbation: Secondary | ICD-10-CM

## 2018-11-21 NOTE — Patient Instructions (Signed)
Recommending going to the emergency department and admission for plasmapheresis  Continue all other medications as you are taking

## 2018-11-21 NOTE — Telephone Encounter (Signed)
Patient's daughter left a message with the after hours service regarding her mother and requesting a call from the office regarding her mother being sent to the ER. We spoke as well and she has some questions and concerns.  Please Call. Thanks

## 2018-11-21 NOTE — Progress Notes (Signed)
Follow-up Visit   Date: 11/21/18   Erica Drake MRN: [id number] DOB: [birth year]   Interim History: Erica Drake is a 83 y.o. female returning to the clinic for follow-up of seropositive myasthenia gravis.  The patient was accompanied to the clinic by self.  Over the past few months, she has continued to feel that IVIG every 3 weeks is not as effective as it previously was.  She is having fatigable droopy eyelids, especially when watching TV.  She also complains of some neck heaviness, which she has previously been hospitalized for for near [facility] crisis.  She has some difficulty with swallowing, no new shortness of breath.  She is stumbling more frequently and feels that she is dragging her feet because her legs feel heavy.  At her prior visits, I have strongly urged the patient to consider starting Solaris or hospital admission for plasmapheresis.  Her current regimen includes IVIG every 3 weeks, azathioprine 75 mg twice daily and Mestinon 60 mg 3 times daily.  She has not been able to tolerate corticosteroids in the past.  She sought a second opinion with Dr. Berdine Addison at [facility] who also recommended Solaris versus plasmapheresis. He started her on Cellcept, but she did not take this medication.   She has been very reluctant to go to the hospital for admission, because she does not have any local social support to care for her dog.  Her daughter in [place] has also considered options to move her to [place], once it is safe to do so.   Medications:  Current Outpatient Medications on File Prior to Visit  Medication Sig Dispense Refill  . albuterol (PROAIR HFA) 108 (90 Base) MCG/ACT inhaler Inhale 1-2 puffs into the lungs every 4 (four) hours as needed for wheezing or shortness of breath. 1 Inhaler 12  . albuterol (PROVENTIL) (2.5 MG/3ML) 0.083% nebulizer solution Take 3 mLs (2.5 mg total) by nebulization every 6 (six) hours as needed for wheezing or shortness of  breath. 25 vial 5  . Azathioprine 75 MG TABS Take 1 tablet (75 mg total) by mouth 2 (two) times daily. 180 each 3  . buPROPion (WELLBUTRIN SR) 200 MG 12 hr tablet Take 200 mg by mouth 2 (two) times daily after a meal.     . clonazePAM (KLONOPIN) 1 MG disintegrating tablet DIS ONE T PO QD PRF PANIC ATTACK    . clonazePAM (KLONOPIN) 1 MG tablet     . dexlansoprazole (DEXILANT) 60 MG capsule Take 60 mg by mouth daily.    Marland Kitchen doxycycline (VIBRA-TABS) 100 MG tablet     . GAMUNEX-C 5 GM/50ML SOLN   16  . Glycopyrrolate-Formoterol (BEVESPI AEROSPHERE) 9-4.8 MCG/ACT AERO Inhale 2 puffs into the lungs 2 (two) times daily. 2 Inhaler 0  . meclizine (ANTIVERT) 12.5 MG tablet Take 1 tablet (12.5 mg total) by mouth 3 (three) times daily as needed for dizziness. 30 tablet 3  . methylcellulose (CITRUCEL) oral powder Take daily by mouth.    . montelukast (SINGULAIR) 10 MG tablet Take by mouth.    . mycophenolate (CELLCEPT) 250 MG capsule Take by mouth 2 (two) times daily. 1 capsule daily for 5 days  2 capsules daily starting today 10/29/2018    . nitrofurantoin, macrocrystal-monohydrate, (MACROBID) 100 MG capsule     . pyridostigmine (MESTINON) 60 MG tablet TAKE 1 TABLET BY MOUTH 3 TIMES A DAY. 270 tablet 0  . trimethoprim (TRIMPEX) 100 MG tablet Take 100 mg by mouth daily.  2   Current Facility-Administered Medications on File Prior to Visit  Medication Dose Route Frequency Provider Last Rate Last Dose  . methylPREDNISolone acetate (DEPO-MEDROL) injection 80 mg  80 mg Other Once Magnus Sinning, MD        Allergies:  Allergies  Allergen Reactions  . Cortisone Shortness Of Breath, Swelling and Other (See Comments)    Tongue swelling, Injection into knees and hips ave not been issue 2020.    Marland Kitchen Amlodipine Besylate Cough  . Chlorhexidine Gluconate Itching    Skin on face turned red   . Clindamycin Itching  . Dexamethasone Other (See Comments)    Per MD  . Duloxetine Other (See Comments)    Unknown  reaction  . Morphine Sulfate Other (See Comments)    Unknown reaction  . Niacin Other (See Comments)  . Other Other (See Comments)    Pollen - nasal reaction  . Prednisone Other (See Comments)    Causes stomach pain. Sores in her mouth.   . Rosuvastatin Calcium Other (See Comments)    Muscle aches  . Statins Other (See Comments)    Muscle aches  . Sulfa Antibiotics Other (See Comments)    May possibly have caused deafness in one ear  . Valsartan Other (See Comments)    Unknown reaction  . Strawberry Extract Itching and Rash    Review of Systems:  CONSTITUTIONAL: No fevers, chills, night sweats, or weight loss.  EYES: No visual changes or eye pain ENT: No hearing changes.  No history of nose bleeds.   RESPIRATORY: No cough, wheezing and shortness of breath.   CARDIOVASCULAR: Negative for chest pain, and palpitations.   GI: Negative for abdominal discomfort, blood in stools or black stools.  No recent change in bowel habits.   GU:  No history of incontinence.   MUSCLOSKELETAL: +history of joint pain or swelling.  No myalgias.   SKIN: Negative for lesions, rash, and itching.   ENDOCRINE: Negative for cold or heat intolerance, polydipsia or goiter.   PSYCH:  + depression or anxiety symptoms.   NEURO: As Above.   Vital Signs:  BP 132/62   Pulse 70   Ht 5\' 3"  (1.6 m)   Wt 143 lb (64.9 kg)   SpO2 94%   BMI 25.33 kg/m    General Medical Exam:   General:  Well appearing, comfortable  Eyes/ENT: see cranial nerve examination.   Neck:   No carotid bruits. Respiratory:  Clear to auscultation, good air entry bilaterally.   Cardiac:  Regular rate and rhythm, no murmur.   Ext:  No edema, healing wound on the anterior right shin  Neurological Exam: MENTAL STATUS including orientation to time, place, person, recent and remote memory, attention span and concentration, language, and fund of knowledge is normal.  Speech is not dysarthric.  CRANIAL NERVES:  Pupils equal round and  reactive to light.  Normal conjugate, extra-ocular eye movements in all directions of gaze.  Subtle left ptosis at baseline, with mild worsening worsening with sustained upgaze.  Face is symmetric. Palate elevates symmetrically.  Tongue is midline, strength is 5-/5.  Orbicularis oris, orbicularis oculi is 5/5.  Hallucinated strength is poor 4-/5  MOTOR:  Motor strength is 4/5 in the upper and lower extremities.  Neck flexion is 4/5 (worse)  MSRs:  Reflexes are 2+/4 throughout.  COORDINATION/GAIT:  Normal finger-to- nose-finger.   She is unable to stand up without using arms to push off.  Gait appears stable, unassisted  IMPRESSION/PLAN: Seropositive myasthenia gravis with exacerbation, thymoma negative.   With her generalized bulbar and limb weakness, I recommend patient get admitted for plasmapheresis.   Unfortunately, despite being on IVIG every 3 weeks, azathioprine 150 mg daily, and Mestinon 60 mg 3 times daily she continues to be quite symptomatic. She has been authorized to [employer], but patient has been very reluctant to get meningococcal vaccinations and is still contemplating whether she wants to start this or not.  I think this would be a very good long-term option for her to try to wean IVIG, which is becoming less effective. I do not recommend that she travel to [place] in her current state.  Should she improve with plasmapheresis, this may be reconsidered. Myasthenia crisis warning signs were discussed, patient was instructed to call 911 should she develop shortness of breath, worsening neck weakness, or difficulty swallowing.  She had many questions which were answered to the best of my ability.  Greater than 50% of this 40 minute visit was spent in counseling, explanation of diagnosis, planning of further management, and coordination of care.   Thank you for allowing me to participate in patient's care.  If I can answer any additional questions, I would be pleased to do  so.    Sincerely,    Naya Ilagan K. Posey Pronto, DO

## 2018-11-21 NOTE — Telephone Encounter (Signed)
-----   Message from Alda Berthold, DO sent at 11/21/2018  1:23 PM EDT ----- Can you call patient and find how why she is on mycophenolate mofetil (Cellcept) and who prescribed it?  This is a medication used to suppress the immune system, similar like Azathioprine that she is already taking.  I want to be sure she's not on duplicate medications.  Thanks,  [employer]

## 2018-11-21 NOTE — Telephone Encounter (Signed)
Patient is not taken cellcept.

## 2018-11-21 NOTE — Telephone Encounter (Signed)
Please inform the daughter that I felt that she was too weak to wait to start Solaris or travel. The only option is to go to the hospital for plasmapheresis, which is going to be the quickest way to get her stronger again.

## 2018-11-21 NOTE — Telephone Encounter (Signed)
Please advise 

## 2018-11-21 NOTE — Telephone Encounter (Signed)
Patient wants daughter admitted?

## 2018-11-21 NOTE — Telephone Encounter (Signed)
Called patient's daughter, [place], with clinical update.  Daughter is planning on flying to [facility] in the next day and will bring patient to the ER either Sunday/Monday to get admitted for PLEX for myasthenia gravis exacerbation.  She will come to the ER sooner, if symptoms gets worse.

## 2018-11-24 ENCOUNTER — Telehealth: Payer: Self-pay | Admitting: Neurology

## 2018-11-24 NOTE — Telephone Encounter (Signed)
I do not have this she can contact hospital

## 2018-11-24 NOTE — Telephone Encounter (Signed)
Patient called again to see if Dr. Posey Pronto wants her to go to the hospital for Plasmapheresis today even though she has a cold.

## 2018-11-24 NOTE — Telephone Encounter (Signed)
As long as she is feeling stable (no shortness of breath, worsening weakness), she may hold off on going to the hospital when well.

## 2018-11-24 NOTE — Telephone Encounter (Signed)
Please advise 

## 2018-11-24 NOTE — Telephone Encounter (Signed)
Patient called to see what the procedure code for plasmapheresis is.

## 2018-11-24 NOTE — Telephone Encounter (Signed)
Patient was instructed to go to the hospital for the Plasma pharisees - she was not feeling well so she did not go yesterday. She said she would go back when she starts feeling better. She is wanting to speak with the nurse. Thanks!

## 2018-11-24 NOTE — Telephone Encounter (Signed)
Called and left patient a message to call the hospital for the code.

## 2018-11-24 NOTE — Telephone Encounter (Signed)
Pt.notified

## 2018-11-25 ENCOUNTER — Other Ambulatory Visit: Payer: Self-pay

## 2018-11-25 DIAGNOSIS — Z20822 Contact with and (suspected) exposure to covid-19: Secondary | ICD-10-CM

## 2018-11-26 ENCOUNTER — Telehealth: Payer: Self-pay | Admitting: Neurology

## 2018-11-26 LAB — NOVEL CORONAVIRUS, NAA: SARS-CoV-2, NAA: NOT DETECTED

## 2018-11-26 NOTE — Telephone Encounter (Signed)
Returned patient's call. She stated she feels like she just has a cold or allergies: headache, itchy eyes, sore throat, runny nose. Asked her to explain "chest tightness" she described to front desk and she said NO chest pain just feels tight with congestion. Wears 2L  02 at night.  NO fever. Went and got covid tested yesterday and it came back negative today.  She has MG and reviewed with patient (per Dr. Serita Grit office note from 9/25) :  Myasthenia crisis warning signs were discussed, patient was instructed to call 911 should she develop shortness of breath, worsening neck weakness, or difficulty swallowing.  Patient verbalizes understanding of when she would need to go to ER.  At that appt 9/25 Dr. Posey Pronto recommended patient go to the ER for plasmapheresis. Patient stated she hasn't been yet but was planning to go tomorrow and wanted to make sure it was still fine to go for that with her cold/allergies.  I see telephone note from 2 days ago where Dr. Posey Pronto stated as long as she is feeling stable (no SOB/worsening weakness) she can hold off on going to hospital when well.   Also she is due for her 3 days of IVIG next 3 days typically infused at home that she wanted to see if she would be able to get when she is in the hospital.  Dr Posey Pronto - patient wanted to know 1. Can she still go with a cold/allergies to the ER tomorrow for plasmapheresis?   2. Does it matter ER at [facility] or [facility]?  3. Will this delay her IVIG infusion?  Told patient will pass this on to MD and will call her back with information likely tomorrow am. Instructed to go to ER if an emergency. Verbalized understanding.

## 2018-11-26 NOTE — Telephone Encounter (Signed)
Patient called regarding her not feeling well. She was tested yesterday for COVID but it came back negative. She said she has a headache, Itchy eyes and sore throat as well as her chest feeling tight and a runny nose. She feels it could be allergies but not sure. She is wanting to know should she go to the hospital for her IVIG or have it at home? Please Call. Thanks

## 2018-11-27 ENCOUNTER — Encounter (HOSPITAL_COMMUNITY): Payer: Self-pay

## 2018-11-27 ENCOUNTER — Emergency Department (HOSPITAL_COMMUNITY): Payer: Medicare Other

## 2018-11-27 ENCOUNTER — Other Ambulatory Visit: Payer: Self-pay

## 2018-11-27 ENCOUNTER — Inpatient Hospital Stay (HOSPITAL_COMMUNITY)
Admission: EM | Admit: 2018-11-27 | Discharge: 2018-12-05 | DRG: 982 | Disposition: A | Discharge status: Home Health | Payer: Medicare Other | Source: Ambulatory Visit | Attending: Internal Medicine | Admitting: Internal Medicine

## 2018-11-27 DIAGNOSIS — F419 Anxiety disorder, unspecified: Secondary | ICD-10-CM

## 2018-11-27 DIAGNOSIS — E1122 Type 2 diabetes mellitus with diabetic chronic kidney disease: Secondary | ICD-10-CM | POA: Diagnosis present

## 2018-11-27 DIAGNOSIS — G7001 Myasthenia gravis with (acute) exacerbation: Secondary | ICD-10-CM | POA: Diagnosis not present

## 2018-11-27 DIAGNOSIS — N3011 Interstitial cystitis (chronic) with hematuria: Secondary | ICD-10-CM | POA: Diagnosis present

## 2018-11-27 DIAGNOSIS — Z79899 Other long term (current) drug therapy: Secondary | ICD-10-CM

## 2018-11-27 DIAGNOSIS — J449 Chronic obstructive pulmonary disease, unspecified: Secondary | ICD-10-CM | POA: Diagnosis not present

## 2018-11-27 DIAGNOSIS — Z87891 Personal history of nicotine dependence: Secondary | ICD-10-CM

## 2018-11-27 DIAGNOSIS — N179 Acute kidney failure, unspecified: Secondary | ICD-10-CM | POA: Diagnosis not present

## 2018-11-27 DIAGNOSIS — M797 Fibromyalgia: Secondary | ICD-10-CM | POA: Diagnosis not present

## 2018-11-27 DIAGNOSIS — Z853 Personal history of malignant neoplasm of breast: Secondary | ICD-10-CM

## 2018-11-27 DIAGNOSIS — Z888 Allergy status to other drugs, medicaments and biological substances status: Secondary | ICD-10-CM

## 2018-11-27 DIAGNOSIS — K219 Gastro-esophageal reflux disease without esophagitis: Secondary | ICD-10-CM | POA: Diagnosis present

## 2018-11-27 DIAGNOSIS — R197 Diarrhea, unspecified: Secondary | ICD-10-CM | POA: Diagnosis present

## 2018-11-27 DIAGNOSIS — Z825 Family history of asthma and other chronic lower respiratory diseases: Secondary | ICD-10-CM

## 2018-11-27 DIAGNOSIS — Z881 Allergy status to other antibiotic agents status: Secondary | ICD-10-CM

## 2018-11-27 DIAGNOSIS — N390 Urinary tract infection, site not specified: Secondary | ICD-10-CM | POA: Diagnosis present

## 2018-11-27 DIAGNOSIS — Z20828 Contact with and (suspected) exposure to other viral communicable diseases: Secondary | ICD-10-CM | POA: Diagnosis not present

## 2018-11-27 DIAGNOSIS — Z885 Allergy status to narcotic agent status: Secondary | ICD-10-CM

## 2018-11-27 DIAGNOSIS — G4734 Idiopathic sleep related nonobstructive alveolar hypoventilation: Secondary | ICD-10-CM | POA: Diagnosis present

## 2018-11-27 DIAGNOSIS — G7000 Myasthenia gravis without (acute) exacerbation: Secondary | ICD-10-CM

## 2018-11-27 DIAGNOSIS — Z803 Family history of malignant neoplasm of breast: Secondary | ICD-10-CM

## 2018-11-27 DIAGNOSIS — D631 Anemia in chronic kidney disease: Secondary | ICD-10-CM | POA: Diagnosis present

## 2018-11-27 DIAGNOSIS — R0902 Hypoxemia: Secondary | ICD-10-CM | POA: Diagnosis present

## 2018-11-27 DIAGNOSIS — R05 Cough: Secondary | ICD-10-CM | POA: Diagnosis not present

## 2018-11-27 DIAGNOSIS — R0602 Shortness of breath: Secondary | ICD-10-CM | POA: Diagnosis not present

## 2018-11-27 DIAGNOSIS — N183 Chronic kidney disease, stage 3 unspecified: Secondary | ICD-10-CM | POA: Diagnosis present

## 2018-11-27 DIAGNOSIS — Z882 Allergy status to sulfonamides status: Secondary | ICD-10-CM

## 2018-11-27 LAB — COMPREHENSIVE METABOLIC PANEL
ALT: 13 U/L (ref 0–44)
AST: 24 U/L (ref 15–41)
Albumin: 3.1 g/dL — ABNORMAL LOW (ref 3.5–5.0)
Alkaline Phosphatase: 68 U/L (ref 38–126)
Anion gap: 8 (ref 5–15)
BUN: 20 mg/dL (ref 8–23)
CO2: 24 mmol/L (ref 22–32)
Calcium: 9.3 mg/dL (ref 8.9–10.3)
Chloride: 106 mmol/L (ref 98–111)
Creatinine, Ser: 1.19 mg/dL — ABNORMAL HIGH (ref 0.44–1.00)
GFR calc Af Amer: 50 mL/min — ABNORMAL LOW (ref >60)
GFR calc non Af Amer: 43 mL/min — ABNORMAL LOW (ref >60)
Glucose, Bld: 119 mg/dL — ABNORMAL HIGH (ref 70–99)
Potassium: 3.6 mmol/L (ref 3.5–5.1)
Sodium: 138 mmol/L (ref 135–145)
Total Bilirubin: 0.5 mg/dL (ref 0.3–1.2)
Total Protein: 7.2 g/dL (ref 6.5–8.1)

## 2018-11-27 LAB — CBC WITH DIFFERENTIAL/PLATELET
Abs Immature Granulocytes: 0.01 10*3/uL (ref 0.00–0.07)
Basophils Absolute: 0 10*3/uL (ref 0.0–0.1)
Basophils Relative: 1 %
Eosinophils Absolute: 0 10*3/uL (ref 0.0–0.5)
Eosinophils Relative: 0 %
HCT: 39.4 % (ref 36.0–46.0)
Hemoglobin: 13.5 g/dL (ref 12.0–15.0)
Immature Granulocytes: 0 %
Lymphocytes Relative: 16 %
Lymphs Abs: 0.9 10*3/uL (ref 0.7–4.0)
MCH: 34.2 pg — ABNORMAL HIGH (ref 26.0–34.0)
MCHC: 34.3 g/dL (ref 30.0–36.0)
MCV: 99.7 fL (ref 80.0–100.0)
Monocytes Absolute: 0.4 10*3/uL (ref 0.1–1.0)
Monocytes Relative: 8 %
Neutro Abs: 4.1 10*3/uL (ref 1.7–7.7)
Neutrophils Relative %: 75 %
Platelets: 298 10*3/uL (ref 150–400)
RBC: 3.95 MIL/uL (ref 3.87–5.11)
RDW: 14 % (ref 11.5–15.5)
WBC: 5.4 10*3/uL (ref 4.0–10.5)
nRBC: 0 % (ref 0.0–0.2)

## 2018-11-27 LAB — URINALYSIS, ROUTINE W REFLEX MICROSCOPIC
Bilirubin Urine: NEGATIVE
Glucose, UA: NEGATIVE mg/dL
Hgb urine dipstick: NEGATIVE
Ketones, ur: NEGATIVE mg/dL
Nitrite: NEGATIVE
Protein, ur: 30 mg/dL — AB
Specific Gravity, Urine: 1.029 (ref 1.005–1.030)
pH: 5 (ref 5.0–8.0)

## 2018-11-27 MED ORDER — ALBUTEROL SULFATE (2.5 MG/3ML) 0.083% IN NEBU: 2.5000 mg | INHALATION_SOLUTION | Freq: Four times a day (QID) | RESPIRATORY_TRACT | Status: DC | PRN

## 2018-11-27 MED ORDER — SODIUM CHLORIDE 0.9 % IV SOLN
Freq: Once | INTRAVENOUS | Status: AC
  Administered 2018-11-27: 21:00:00 via INTRAVENOUS

## 2018-11-27 MED ORDER — PYRIDOSTIGMINE BROMIDE 60 MG PO TABS
60.0000 mg | ORAL_TABLET | Freq: Three times a day (TID) | ORAL | Status: DC
  Administered 2018-11-27 – 2018-12-05 (×18): 60 mg via ORAL
  Filled 2018-11-27 (×26): qty 1

## 2018-11-27 MED ORDER — ONDANSETRON HCL 4 MG PO TABS
4.0000 mg | ORAL_TABLET | Freq: Four times a day (QID) | ORAL | Status: DC | PRN
  Administered 2018-11-29: 4 mg via ORAL
  Filled 2018-11-27: qty 1

## 2018-11-27 MED ORDER — AZATHIOPRINE 50 MG PO TABS
75.0000 mg | ORAL_TABLET | Freq: Two times a day (BID) | ORAL | Status: DC
  Administered 2018-11-27 – 2018-12-04 (×14): 75 mg via ORAL
  Filled 2018-11-27 (×18): qty 2

## 2018-11-27 MED ORDER — NITROFURANTOIN MONOHYD MACRO 100 MG PO CAPS
100.0000 mg | ORAL_CAPSULE | Freq: Two times a day (BID) | ORAL | Status: DC
  Administered 2018-11-27: 100 mg via ORAL
  Filled 2018-11-27 (×3): qty 1

## 2018-11-27 MED ORDER — ACETAMINOPHEN 325 MG PO TABS
650.0000 mg | ORAL_TABLET | Freq: Four times a day (QID) | ORAL | Status: DC | PRN
  Administered 2018-11-30 – 2018-12-03 (×3): 650 mg via ORAL
  Filled 2018-11-27 (×3): qty 2

## 2018-11-27 MED ORDER — ONDANSETRON HCL 4 MG/2ML IJ SOLN: 4.0000 mg | Freq: Four times a day (QID) | INTRAMUSCULAR | Status: DC | PRN

## 2018-11-27 MED ORDER — BUPROPION HCL ER (SR) 100 MG PO TB12
200.0000 mg | ORAL_TABLET | Freq: Two times a day (BID) | ORAL | Status: DC
  Administered 2018-11-27 – 2018-11-29 (×2): 200 mg via ORAL
  Filled 2018-11-27 (×6): qty 2

## 2018-11-27 MED ORDER — ACETAMINOPHEN 650 MG RE SUPP: 650.0000 mg | Freq: Four times a day (QID) | RECTAL | Status: DC | PRN

## 2018-11-27 MED ORDER — CLONAZEPAM 1 MG PO TBDP: 1.0000 mg | ORAL_TABLET | Freq: Every day | ORAL | Status: DC

## 2018-11-27 NOTE — Consult Note (Addendum)
Neurology Consultation  Reason for Consult: Myasthenia gravis exacerbation Referring Physician: Alvino Chapel   History is obtained from: patient  HPI: Erica Drake is a 83 y.o. female with history of vertigo, insomnia, fibromyalgia, myasthenia gravis, COPD, chronic fatigue, breast cancer and interstitial cystitis.  Patient presents to the ED after a couple months of weakness which became more sever in her LE and neck weakness over the last couple weeks.  Over the last few weeks patient has been significantly less mobile and less stable with walking.  She notes that she is having neck discomfort secondary to weakness.  She has chronic double vision when staring at the TV or computer screen for long periods of time.  She also notes that she is having jaw tightness and mild difficulty swallowing.  Due to this she has been making sure she eats soft food and chews for a long time.  Over the past week she has seen a pulmonologist who has placed her on nasal cannula oxygen at night due to pulmonary test showing that she is lacking oxygen at night.  Patient was to get her IVIG today which she gets every 2 weeks but due to the worsening symptoms she was told to go to the hospital by Dr. Posey Pronto her primary neurologist for plasma exchange.  She has not missed any of her myasthenic prescriptions.     ROS: A 14 point ROS was performed and is negative except as noted in the HPI.   Past Medical History:  Diagnosis Date  . Allergic rhinitis   . Arthritis   . Breast cancer ([place])   . Chronic bronchitis    some mild wheezing at change of weather-  . Chronic fatigue   . Complication of anesthesia    pt reports she always has to be admitted after surgery with pulmon. consult  d/t respiratory history  . COPD mixed type ([place])   . Decreased hearing    intol hearing aids. Deaf left ear, hearing aid right ear.  . Diabetes mellitus without complication ([place])   . Eustachian tube dysfunction    decreased  hearing  . Fibromyalgia   . GERD (gastroesophageal reflux disease)    [place]  . Hx of myasthenia gravis   . Insomnia   . Interstitial cystitis    [facility]-Dr. Karsten Ro sees periodically.(occ. self caths at home)  . Renal cyst   . Vertigo      Family History  Adopted: Yes  Problem Relation Age of Onset  . Asthma Mother        biologic  . Breast cancer Mother        age 67  . Asthma Son   . Stroke Neg Hx   . Neuropathy Neg Hx   . Multiple sclerosis Neg Hx     Social History:   reports that she quit smoking about 58 years ago. Her smoking use included cigarettes. She quit smokeless tobacco use about 44 years ago. She reports that she does not drink alcohol or use drugs.  Medications  Current Facility-Administered Medications:  .  methylPREDNISolone acetate (DEPO-MEDROL) injection 80 mg, 80 mg, Other, Once, Magnus Sinning, MD  Current Outpatient Medications:  .  albuterol (PROAIR HFA) 108 (90 Base) MCG/ACT inhaler, Inhale 1-2 puffs into the lungs every 4 (four) hours as needed for wheezing or shortness of breath., Disp: 1 Inhaler, Rfl: 12 .  albuterol (PROVENTIL) (2.5 MG/3ML) 0.083% nebulizer solution, Take 3 mLs (2.5 mg total) by nebulization every 6 (six) hours as  needed for wheezing or shortness of breath., Disp: 25 vial, Rfl: 5 .  Azathioprine 75 MG TABS, Take 1 tablet (75 mg total) by mouth 2 (two) times daily., Disp: 180 each, Rfl: 3 .  buPROPion (WELLBUTRIN SR) 200 MG 12 hr tablet, Take 200 mg by mouth 2 (two) times daily after a meal. , Disp: , Rfl:  .  clonazePAM (KLONOPIN) 1 MG disintegrating tablet, DIS ONE T PO QD PRF PANIC ATTACK, Disp: , Rfl:  .  clonazePAM (KLONOPIN) 1 MG tablet, , Disp: , Rfl:  .  dexlansoprazole (DEXILANT) 60 MG capsule, Take 60 mg by mouth daily., Disp: , Rfl:  .  doxycycline (VIBRA-TABS) 100 MG tablet, , Disp: , Rfl:  .  GAMUNEX-C 5 GM/50ML SOLN, , Disp: , Rfl: 16 .  Glycopyrrolate-Formoterol (BEVESPI AEROSPHERE) 9-4.8 MCG/ACT AERO,  Inhale 2 puffs into the lungs 2 (two) times daily., Disp: 2 Inhaler, Rfl: 0 .  meclizine (ANTIVERT) 12.5 MG tablet, Take 1 tablet (12.5 mg total) by mouth 3 (three) times daily as needed for dizziness., Disp: 30 tablet, Rfl: 3 .  methylcellulose (CITRUCEL) oral powder, Take daily by mouth., Disp: , Rfl:  .  montelukast (SINGULAIR) 10 MG tablet, Take by mouth., Disp: , Rfl:  .  mycophenolate (CELLCEPT) 250 MG capsule, Take by mouth 2 (two) times daily. 1 capsule daily for 5 days  2 capsules daily starting today 10/29/2018, Disp: , Rfl:  .  nitrofurantoin, macrocrystal-monohydrate, (MACROBID) 100 MG capsule, , Disp: , Rfl:  .  pyridostigmine (MESTINON) 60 MG tablet, TAKE 1 TABLET BY MOUTH 3 TIMES A DAY., Disp: 270 tablet, Rfl: 0 .  trimethoprim (TRIMPEX) 100 MG tablet, Take 100 mg by mouth daily., Disp: , Rfl: 2   Exam: Current vital signs: BP (!) 152/63   Pulse 92   Temp 97.9 F (36.6 C) (Oral)   Resp 16   Ht 5\' 3"  (1.6 m)   Wt 64.9 kg   SpO2 96%   BMI 25.33 kg/m  Vital signs in last 24 hours: Temp:  [97.9 F (36.6 C)] 97.9 F (36.6 C) (10/01 1154) Pulse Rate:  [92] 92 (10/01 1154) Resp:  [16] 16 (10/01 1154) BP: (152)/(63) 152/63 (10/01 1154) SpO2:  [96 %] 96 % (10/01 1154) Weight:  [64.9 kg] 64.9 kg (10/01 1155)  Physical Exam  Constitutional: Appears well-developed and well-nourished.  Psych: Affect appropriate to situation Eyes: No scleral injection HENT: No OP obstrucion Head: Normocephalic.  Cardiovascular: Normal rate and regular rhythm.  Respiratory: Effort normal, non-labored breathing GI: Soft.  No distension. There is no tenderness.  Skin: WDI  Neuro: Mental Status: Patient is awake, alert, oriented to person, place, month, year, and situation. Patient is able to give a clear and coherent history. No signs of aphasia or neglect Cranial Nerves: II: Visual Fields are full.  III,IV, VI: EOMI without ptosis or diploplia. Pupils equal, round and reactive to  light.  Mild ptosis with prolonged vertical gaze. V: Facial sensation is symmetric to temperature VII: Facial movement is symmetric.  VIII: hearing is intact to voice X: Palat elevates symmetrically XI: Shoulder shrug is symmetric. XII: tongue is midline without atrophy or fasciculations.  Motor: Tone is normal. Bulk is normal. 4/5 strength was present in all four extremities.  Shoulder abduction and hip flexion is mildly weaker secondary to discomfort.  Neck flexion and extension is 4/5 with discomfort.  Patient is able to count to 18 with deep breath.  NIF -30 Sensory: Sensation is symmetric to light  touch and temperature in the arms and legs. Deep Tendon Reflexes: 2+ in the Achilles with 3+ symmetric in the biceps and patellae.  Plantars: Toes are downgoing bilaterally.  Cerebellar: FNF and HKS are intact bilaterally  Labs I have reviewed labs in epic and the results pertinent to this consultation are:   CBC    Component Value Date/Time   WBC 5.4 11/27/2018 1204   RBC 3.95 11/27/2018 1204   HGB 13.5 11/27/2018 1204   HGB 12.6 07/31/2017 1209   HCT 39.4 11/27/2018 1204   PLT 298 11/27/2018 1204   PLT 355 07/31/2017 1209   MCV 99.7 11/27/2018 1204   MCH 34.2 (H) 11/27/2018 1204   MCHC 34.3 11/27/2018 1204   RDW 14.0 11/27/2018 1204   LYMPHSABS 0.9 11/27/2018 1204   MONOABS 0.4 11/27/2018 1204   EOSABS 0.0 11/27/2018 1204   BASOSABS 0.0 11/27/2018 1204    CMP     Component Value Date/Time   NA 138 11/27/2018 1204   NA 142 08/26/2015 1153   K 3.6 11/27/2018 1204   CL 106 11/27/2018 1204   CO2 24 11/27/2018 1204   GLUCOSE 119 (H) 11/27/2018 1204   BUN 20 11/27/2018 1204   BUN 20 08/26/2015 1153   CREATININE 1.19 (H) 11/27/2018 1204   CREATININE 1.37 (H) 07/31/2017 1209   CALCIUM 9.3 11/27/2018 1204   PROT 7.2 11/27/2018 1204   ALBUMIN 3.1 (L) 11/27/2018 1204   AST 24 11/27/2018 1204   AST 24 07/31/2017 1209   ALT 13 11/27/2018 1204   ALT 9 07/31/2017 1209    ALKPHOS 68 11/27/2018 1204   BILITOT 0.5 11/27/2018 1204   BILITOT <0.2 (L) 07/31/2017 1209   GFRNONAA 43 (L) 11/27/2018 1204   GFRNONAA 36 (L) 07/31/2017 1209   GFRAA 50 (L) 11/27/2018 1204   GFRAA 41 (L) 07/31/2017 1209    Lipid Panel  No results found for: CHOL, TRIG, HDL, CHOLHDL, VLDL, LDLCALC, LDLDIRECT   Imaging I have reviewed the images obtained:  Etta Quill PA-C [facility] [phone]  M-F  (9:00 am- 5:00 PM)  11/27/2018, 2:03 PM     Assessment: 83 year old female with progressive worsening of her myasthenia over the last month however significant worsening over the last 2 weeks.  Concern for myasthenia gravis exacerbation.  Given that she has not responded robustly to IVIG, I do think it is reasonable to use plasma exchange.  We will admit her to monitor her respiratory status and assess for other possible contributing factors as well as to get plasma exchange started.  She may not need to be inpatient for all exchanges if her NIF/VC remain stable. She does seem stable for  floor admission currently.   Impression: -Myasthenia gravis exacerbation  Recommendations: -Plasma exchange with tunneled catheter/order has been placed  -Physical therapy and occupational therapy eval -NIF and VC q8h -Continue home Mestinon dosing -contineu home azathioprine   Roland Rack, MD [facility] [phone]  If 7pm- 7am, please page neurology on call as listed in [place].    Drugs to avoid if you have Myasthenia Gravis:   Many drugs have been reported to have adverse effects in patients with MG (see below). However, not all patients react adversely to all these drugs. Conversely, not all "safe" drugs can be used with impunity in patients with MG.As a rule, the listed drugs should be avoided whenever possible, and patients with MG should be followed closely when any new drug is introduced.  Drugs that may exacerbate MG  Antibiotics   Aminoglycosides: e.g., streptomycin, tobramycin, kanamycin  Quinolones: e.g., ciprofloxacin, levofloxacin, ofloxacin, gatifloxacin  Macrolides: e.g., erythromycin, azithromycin, telithromycin  Nondepolarizing muscle relaxants for surgery  d-Tubocurarine (curare), pancuronium, vecuronium, atracurium  Beta-blocking agents  Propranolol, atenolol, metoprolol  Local anesthetics and related agents  Procaine, Xylocaine in large amounts  Procainamide (for arrhythmias)  Botulinum toxin  Botox exacerbates weakness.  Quinine derivatives  Quinine, quinidine, chloroquine, mefloquine ([place])  Magnesium  Decreases ACh release  Penicillamine  May cause MG  Drugs with important interactions in MG  Cyclosporine  Broad range of drug interactions, which may raise or lower cyclosporine levels  Azathioprine  Avoid allopurinol; combination may result in myelosuppression.

## 2018-11-27 NOTE — Progress Notes (Signed)
PT performed NIF of 30 on three attempts.

## 2018-11-27 NOTE — H&P (Signed)
History and Physical    Erica Drake [id number] DOB: [birth year] DOA: 11/27/2018  PCP: Prince Solian, MD  Patient coming from: Home I have personally briefly reviewed patient's old medical records in [place]  Chief Complaint: Worsening of weakness  HPI: Erica Drake is a 83 y.o. female with medical history significant of myasthenia gravis, breast cancer, interstitial cystitis, fibromyalgia, presents to the emergency department due to worsening of weakness since couple of months.  Patient has myasthenia gravis and she is on IVIG every 3 weeks.  Patient reports since last few weeks she is more weak, has neck pain, double vision, low energy, drooping of face and wheezing.  As per daughter at bedside reports  jaw tightness and difficulty swallowing.  Her current regime for myasthenia gravis: IVIG every 3 weeks, azathioprine 75 mg p.o. twice daily and Mestinon 60 mg 3 times daily.  She is compliant with her medication and she took all of her home medicines this morning.  She recently evaluated by her pulmonologist and placed her on 2 L of oxygen via nasal cannulae at bedtime due to low oxygen saturation.  Due to her worsening weakness she was told by her neurologist Dr. Posey Pronto to go to the emergency department for plasma exchange.  Patient reports dysuria, lower abdominal pain, hematuria since couple of days.  She has history of chronic interstitial cystitis.  She denies fever, chills, cough, congestion, runny nose, sore throat, recent COVID-19 exposure, headache, lightheadedness, dizziness, nausea, vomiting, diarrhea, decreased appetite, weight loss.  She lives alone at independent living facility and she is independent on her daily life activities.  She denies smoking, alcohol, illicit drug use.  She is compliant with her IVIG appointments.  ED Course: Patient is afebrile, no leukocytosis, neurology consulted by EDP-recommended plasmapheresis.  Chest x-ray is negative.  COVID  test -2 days ago.  Review of Systems: As per HPI otherwise negative.    Past Medical History:  Diagnosis Date  . Allergic rhinitis   . Arthritis   . Breast cancer ([place])   . Chronic bronchitis    some mild wheezing at change of weather-  . Chronic fatigue   . Complication of anesthesia    pt reports she always has to be admitted after surgery with pulmon. consult  d/t respiratory history  . COPD mixed type ([place])   . Decreased hearing    intol hearing aids. Deaf left ear, hearing aid right ear.  . Diabetes mellitus without complication ([place])   . Eustachian tube dysfunction    decreased hearing  . Fibromyalgia   . GERD (gastroesophageal reflux disease)    [place]  . Hx of myasthenia gravis   . Insomnia   . Interstitial cystitis    [facility]-Dr. Karsten Ro sees periodically.(occ. self caths at home)  . Renal cyst   . Vertigo     Past Surgical History:  Procedure Laterality Date  . BREAST BIOPSY Right 06/2017   x2  . BREAST CYST ASPIRATION    . CHOLECYSTECTOMY N/A 01/14/2015   Procedure: LAPAROSCOPIC CHOLECYSTECTOMY WITH INTRAOPERATIVE CHOLANGIOGRAM;  Surgeon: Johnathan Hausen, MD;  Location: [facility];  Service: General;  Laterality: N/A;  . KNEE ARTHROSCOPY WITH MEDIAL MENISECTOMY Left 07/18/2016   Procedure: LEFT KNEE ARTHROSCOPY WITH MEDIAL MENISECTOMY dedribement and chrodralplasty;  Surgeon: Gaynelle Arabian, MD;  Location: [facility];  Service: Orthopedics;  Laterality: Left;  . NISSEN FUNDOPLICATION     tx of GERD     reports that she quit smoking about 58  years ago. Her smoking use included cigarettes. She quit smokeless tobacco use about 44 years ago. She reports that she does not drink alcohol or use drugs.  Allergies  Allergen Reactions  . Cortisone Shortness Of Breath, Swelling and Other (See Comments)    Tongue swelling, Injection into knees and hips ave not been issue 2020.    . Sulfa Antibiotics Other (See Comments)    May possibly have caused deafness in one  ear  . Adhesive [Tape] Itching and Other (See Comments)    Turns the skin red where applied  . Amlodipine Besylate Cough  . Cellcept [Mycophenolate] Other (See Comments)    Caused severe stomach cramping  . Chlorhexidine Gluconate Itching and Other (See Comments)    Skin on face turned red, also   . Clindamycin Itching  . Dexamethasone Other (See Comments)    Per MD  . Duloxetine Other (See Comments)    Reaction not recalled  . Morphine Sulfate Other (See Comments)    Reaction not recalled  . Niacin Other (See Comments)    Reaction not recalled  . Other Other (See Comments)    Pollen - nasal reaction/congestion  . Prednisone Other (See Comments)    Causes stomach pain. Sores in her mouth.   . Rosuvastatin Calcium Other (See Comments)    Muscle aches  . Statins Other (See Comments)    Muscle aches  . Valsartan Other (See Comments)    Reaction not recalled  . Strawberry Extract Itching and Rash    Family History  Adopted: Yes  Problem Relation Age of Onset  . Asthma Mother        biologic  . Breast cancer Mother        age 31  . Asthma Son   . Stroke Neg Hx   . Neuropathy Neg Hx   . Multiple sclerosis Neg Hx     Prior to Admission medications   Medication Sig Start Date End Date Taking? Authorizing Provider  albuterol (PROAIR HFA) 108 (90 Base) MCG/ACT inhaler Inhale 1-2 puffs into the lungs every 4 (four) hours as needed for wheezing or shortness of breath. 04/07/18   Baird Lyons D, MD  albuterol (PROVENTIL) (2.5 MG/3ML) 0.083% nebulizer solution Take 3 mLs (2.5 mg total) by nebulization every 6 (six) hours as needed for wheezing or shortness of breath. 01/16/18   Reed, Tiffany L, DO  Azathioprine 75 MG TABS Take 1 tablet (75 mg total) by mouth 2 (two) times daily. 01/01/18   Patel, Donika K, DO  buPROPion (WELLBUTRIN SR) 200 MG 12 hr tablet Take 200 mg by mouth 2 (two) times daily after a meal.     [provider]  clonazePAM (KLONOPIN) 1 MG disintegrating  tablet DIS ONE T PO QD PRF PANIC ATTACK 05/26/18   [provider]  clonazePAM (KLONOPIN) 1 MG tablet  03/20/18   [provider]  dexlansoprazole (DEXILANT) 60 MG capsule Take 60 mg by mouth daily.    [provider]  doxycycline (VIBRA-TABS) 100 MG tablet  11/13/18   [provider]  GAMUNEX-C 5 GM/50ML SOLN  05/23/17   [provider]  Glycopyrrolate-Formoterol (BEVESPI AEROSPHERE) 9-4.8 MCG/ACT AERO Inhale 2 puffs into the lungs 2 (two) times daily. 04/07/18   Deneise Lever, MD  meclizine (ANTIVERT) 12.5 MG tablet Take 1 tablet (12.5 mg total) by mouth 3 (three) times daily as needed for dizziness. 12/24/16   Narda Amber K, DO  methylcellulose (CITRUCEL) oral powder Take daily  by mouth.    [provider]  montelukast (SINGULAIR) 10 MG tablet Take by mouth. 10/16/17   [provider]  mycophenolate (CELLCEPT) 250 MG capsule Take by mouth 2 (two) times daily. 1 capsule daily for 5 days  2 capsules daily starting today 10/29/2018    [provider]  nitrofurantoin, macrocrystal-monohydrate, (MACROBID) 100 MG capsule  03/20/18   [provider]  pyridostigmine (MESTINON) 60 MG tablet TAKE 1 TABLET BY MOUTH 3 TIMES A DAY. 10/31/18   Patel, Donika K, DO  trimethoprim (TRIMPEX) 100 MG tablet Take 100 mg by mouth daily. 12/18/17   [provider]    Physical Exam: Vitals:   11/27/18 1154 11/27/18 1155 11/27/18 1607  BP: (!) 152/63  (!) 151/59  Pulse: 92  81  Resp: 16  16  Temp: 97.9 F (36.6 C)    TempSrc: Oral    SpO2: 96%  97%  Weight:  64.9 kg   Height:  5\' 3"  (1.6 m)     Constitutional: NAD, calm, comfortable Vitals:   11/27/18 1154 11/27/18 1155 11/27/18 1607  BP: (!) 152/63  (!) 151/59  Pulse: 92  81  Resp: 16  16  Temp: 97.9 F (36.6 C)    TempSrc: Oral    SpO2: 96%  97%  Weight:  64.9 kg   Height:  5\' 3"  (1.6 m)    Constitutional: Sitting comfortably, not in acute distress,  communicating well, alert and oriented x3.  Eyes: PERRL, lids and conjunctivae normal ENMT: Mucous membranes are moist. Posterior pharynx clear of any exudate or lesions.Normal dentition.  Neck: normal, supple, no masses, no thyromegaly Respiratory: clear to auscultation bilaterally, no wheezing, no crackles. Normal respiratory effort. No accessory muscle use.  Cardiovascular: Regular rate and rhythm, no murmurs / rubs / gallops. No extremity edema. 2+ pedal pulses. No carotid bruits.  Abdomen: no tenderness, no masses palpated. No hepatosplenomegaly. Bowel sounds positive.  Musculoskeletal: no clubbing / cyanosis. No joint deformity upper and lower extremities. Good ROM, no contractures. Normal muscle tone.  Skin: no rashes, lesions, ulcers. No induration Neurologic: CN 2-12 grossly intact. Sensation intact, DTR normal.  Power 4 out of 5 in bilateral upper and lower extremities. Psychiatric: Normal judgment and insight. Alert and oriented x 3. Normal mood.    Labs on Admission: I have personally reviewed following labs and imaging studies  CBC: Recent Labs  Lab 11/27/18 1204  WBC 5.4  NEUTROABS 4.1  HGB 13.5  HCT 39.4  MCV 99.7  PLT [id number]   Basic Metabolic Panel: Recent Labs  Lab 11/27/18 1204  NA 138  K 3.6  CL 106  CO2 24  GLUCOSE 119*  BUN 20  CREATININE 1.19*  CALCIUM 9.3   GFR: Estimated Creatinine Clearance: 34.2 mL/min (A) (by C-G formula based on SCr of 1.19 mg/dL (H)). Liver Function Tests: Recent Labs  Lab 11/27/18 1204  AST 24  ALT 13  ALKPHOS 68  BILITOT 0.5  PROT 7.2  ALBUMIN 3.1*   No results for input(s): LIPASE, AMYLASE in the last 168 hours. No results for input(s): AMMONIA in the last 168 hours. Coagulation Profile: No results for input(s): INR, PROTIME in the last 168 hours. Cardiac Enzymes: No results for input(s): CKTOTAL, CKMB, CKMBINDEX, TROPONINI in the last 168 hours. BNP (last 3 results) No results for input(s): PROBNP in the last  8760 hours. HbA1C: No results for input(s): HGBA1C in the last 72 hours. CBG: No results for input(s): GLUCAP in the last  168 hours. Lipid Profile: No results for input(s): CHOL, HDL, LDLCALC, TRIG, CHOLHDL, LDLDIRECT in the last 72 hours. Thyroid Function Tests: No results for input(s): TSH, T4TOTAL, FREET4, T3FREE, THYROIDAB in the last 72 hours. Anemia Panel: No results for input(s): VITAMINB12, FOLATE, FERRITIN, TIBC, IRON, RETICCTPCT in the last 72 hours. Urine analysis:    Component Value Date/Time   COLORURINE AMBER (A) 11/27/2018 1400   APPEARANCEUR HAZY (A) 11/27/2018 1400   LABSPEC 1.029 11/27/2018 1400   PHURINE 5.0 11/27/2018 1400   GLUCOSEU NEGATIVE 11/27/2018 1400   HGBUR NEGATIVE 11/27/2018 1400   BILIRUBINUR NEGATIVE 11/27/2018 1400   KETONESUR NEGATIVE 11/27/2018 1400   PROTEINUR 30 (A) 11/27/2018 1400   NITRITE NEGATIVE 11/27/2018 1400   LEUKOCYTESUR SMALL (A) 11/27/2018 1400    Radiological Exams on Admission: Dg Chest 2 View  Result Date: 11/27/2018 CLINICAL DATA:  Cough, SOB. Hx of diabetes, COPD, chronic bronchitis. Former [phone]). EXAM: CHEST - 2 VIEW COMPARISON:  01/14/2018 FINDINGS: Cardiac silhouette is normal in size. No mediastinal or hilar masses or evidence of adenopathy. Lungs are hyperexpanded, but clear. No pleural effusion or pneumothorax. Skeletal structures are intact. IMPRESSION: No active cardiopulmonary disease. Electronically Signed   By: Lajean Manes M.D.   On: 11/27/2018 14:47    EKG: Normal sinus rhythm, no acute ST-T wave changes noted.  Assessment/Plan Principal Problem:   Myasthenia gravis with exacerbation ([place]) Active Problems:   COPD mixed type ([facility])   Fibromyalgia   Nocturnal hypoxemia   Anxiety   Acute lower UTI   AKI (acute kidney injury) ([place])   Myasthenia gravis with exacerbation: -Patient has history of seropositive myasthenia gravis and she is on IVIG every 3 weeks, azathioprine 75 mg p.o. twice daily and  Mestinon 60 mg 3 times daily.  Presented with worsening of weakness. -Chest x-ray is negative.  Vitals stable. -Neurology has been consulted by EDP. -Patient will be started on plasmapheresis -We will admit patient at progressive unit for close monitoring.  On telemetry.  On continuous pulse ox.  O2 PRN if oxygen saturation less than 94%.  Aspiration precautions. -Consulted physical therapy, occupational therapy and respiratory therapy -We will keep her n.p.o. after midnight for image guided tunneled plasmapheresis catheter placement tomorrow by IR. -INR is pending.  -Urinary tract infection: -Patient is afebrile, no leukocytosis. -UA is positive for leukocytes, patient is complaining of dysuria, hematuria, lower abdominal pain. -We will start on Macrobid 100 mg p.o. twice daily for 5 days.  Urine culture is obtained and is pending.  AKI on CKD: -Monitor kidney function, avoid nephrotoxic medication. -Start on gentle hydration. -Repeat BMP tomorrow a.m.  COPD: Not in acute exacerbation. -Patient is maintaining oxygen saturation on room air. -On continuous pulse ox. -We will continue home inhalers.  Anxiety: Stable -Continue  Wellbutrin.   DVT prophylaxis: TED/SCD Code Status: Full code Family Communication: Daughter present at bedside.  Plan of care discussed with patient and her daughter in length and both verbalized understanding and agreed with it. Disposition Plan: To be determined  consults called: Neurology Dr. Etta Quill Admission status: Observation at progressive unit.  Mckinley Jewel MD [facility] Pager [phone]  If 7PM-7AM, please contact night-coverage [email or web address] Password [email or web address]  11/27/2018, 4:55 PM

## 2018-11-27 NOTE — Consult Note (Signed)
Chief Complaint: Patient was seen in consultation today for myasthenia gravis exacerbation/tunneled plasmapheresis catheter placement.  Referring Physician(s): Marliss Coots  Supervising Physician: Markus Daft  Patient Status: [facility] - ED  History of Present Illness: Erica Drake is a 83 y.o. female with a past medical history of myasthenia gravis, vertigo, COPD/chronic bronchitis, interstitial cystitis, breast cancer, diabetes mellitus, fibromyalgia, arthritis, and insomnia. She has had progressive weakness over the past few months. Over the past few weeks she has been significantly less mobile/less stable with walking. She currently gets IVIG once every 2 weeks for her myasthenia gravis. She went for her appointment today but was sent to [facility] ED by her neurologist for plasma exchange due to worsening of symptoms (she did not receive IVIG today). In ED, neurology was consulted who recommended plasma exchange and IR consultation for possible tunneled plasmapheresis catheter placement.  IR consulted by Etta Quill, PA-C for possible image-guided tunneled plasmapheresis catheter placement. Patient awake and alert laying in bed. Accompanied by daughter at bedside. Complains of dyspnea, improved since admission. Denies fever, chills, chest pain, abdominal pain, or headache.   Past Medical History:  Diagnosis Date   Allergic rhinitis    Arthritis    Breast cancer ([place])    Chronic bronchitis    some mild wheezing at change of weather-   Chronic fatigue    Complication of anesthesia    pt reports she always has to be admitted after surgery with pulmon. consult  d/t respiratory history   COPD mixed type ([place])    Decreased hearing    intol hearing aids. Deaf left ear, hearing aid right ear.   Diabetes mellitus without complication ([place])    Eustachian tube dysfunction    decreased hearing   Fibromyalgia    GERD (gastroesophageal reflux disease)    Nissen   Hx of  myasthenia gravis    Insomnia    Interstitial cystitis    [facility]-Dr. Karsten Ro sees periodically.(occ. self caths at home)   Renal cyst    Vertigo     Past Surgical History:  Procedure Laterality Date   BREAST BIOPSY Right 06/2017   x2   BREAST CYST ASPIRATION     CHOLECYSTECTOMY N/A 01/14/2015   Procedure: LAPAROSCOPIC CHOLECYSTECTOMY WITH INTRAOPERATIVE CHOLANGIOGRAM;  Surgeon: Johnathan Hausen, MD;  Location: [facility] ORS;  Service: General;  Laterality: N/A;   KNEE ARTHROSCOPY WITH MEDIAL MENISECTOMY Left 07/18/2016   Procedure: LEFT KNEE ARTHROSCOPY WITH MEDIAL MENISECTOMY dedribement and chrodralplasty;  Surgeon: Gaynelle Arabian, MD;  Location: [facility] ORS;  Service: Orthopedics;  Laterality: Left;   NISSEN FUNDOPLICATION     tx of GERD    Allergies: Cortisone, Amlodipine besylate, Chlorhexidine gluconate, Clindamycin, Dexamethasone, Duloxetine, Morphine sulfate, Niacin, Other, Prednisone, Rosuvastatin calcium, Statins, Sulfa antibiotics, Valsartan, and Strawberry extract  Medications: Prior to Admission medications   Medication Sig Start Date End Date Taking? Authorizing Provider  albuterol (PROAIR HFA) 108 (90 Base) MCG/ACT inhaler Inhale 1-2 puffs into the lungs every 4 (four) hours as needed for wheezing or shortness of breath. 04/07/18   Baird Lyons D, MD  albuterol (PROVENTIL) (2.5 MG/3ML) 0.083% nebulizer solution Take 3 mLs (2.5 mg total) by nebulization every 6 (six) hours as needed for wheezing or shortness of breath. 01/16/18   Reed, Tiffany L, DO  Azathioprine 75 MG TABS Take 1 tablet (75 mg total) by mouth 2 (two) times daily. 01/01/18   Patel, Arvin Collard K, DO  buPROPion (WELLBUTRIN SR) 200 MG 12 hr tablet Take 200 mg  by mouth 2 (two) times daily after a meal.     [provider]  clonazePAM (KLONOPIN) 1 MG disintegrating tablet DIS ONE T PO QD PRF PANIC ATTACK 05/26/18   [provider]  clonazePAM (KLONOPIN) 1 MG tablet  03/20/18   [provider]  dexlansoprazole (DEXILANT) 60 MG capsule Take 60 mg by mouth daily.    [provider]  doxycycline (VIBRA-TABS) 100 MG tablet  11/13/18   [provider]  GAMUNEX-C 5 GM/50ML SOLN  05/23/17   [provider]  Glycopyrrolate-Formoterol (BEVESPI AEROSPHERE) 9-4.8 MCG/ACT AERO Inhale 2 puffs into the lungs 2 (two) times daily. 04/07/18   Deneise Lever, MD  meclizine (ANTIVERT) 12.5 MG tablet Take 1 tablet (12.5 mg total) by mouth 3 (three) times daily as needed for dizziness. 12/24/16   Narda Amber K, DO  methylcellulose (CITRUCEL) oral powder Take daily by mouth.    [provider]  montelukast (SINGULAIR) 10 MG tablet Take by mouth. 10/16/17   [provider]  mycophenolate (CELLCEPT) 250 MG capsule Take by mouth 2 (two) times daily. 1 capsule daily for 5 days  2 capsules daily starting today 10/29/2018    [provider]  nitrofurantoin, macrocrystal-monohydrate, (MACROBID) 100 MG capsule  03/20/18   [provider]  pyridostigmine (MESTINON) 60 MG tablet TAKE 1 TABLET BY MOUTH 3 TIMES A DAY. 10/31/18   Patel, Donika K, DO  trimethoprim (TRIMPEX) 100 MG tablet Take 100 mg by mouth daily. 12/18/17   [provider]     Family History  Adopted: Yes  Problem Relation Age of Onset   Asthma Mother        biologic   Breast cancer Mother        age 73   Asthma Son    Stroke Neg Hx    Neuropathy Neg Hx    Multiple sclerosis Neg Hx     Social History   Socioeconomic History   Marital status: Widowed    Spouse name: Not on file   Number of children: 2   Years of education: Not on file   Highest education level: Not on file  Occupational History   Occupation: retired  Scientist, product/process development strain: Not on file   Food insecurity    Worry: Not on file    Inability: Not on file   Transportation needs    Medical: Not on file    Non-medical: Not on file  Tobacco Use   Smoking  status: Former Smoker    Types: Cigarettes    Quit date: 10/28/1960    Years since quitting: 58.1   Smokeless tobacco: Former Systems developer    Quit date: 01/09/1974   Tobacco comment: pt states she was a light smoker 9/2   Substance and Sexual Activity   Alcohol use: No   Drug use: No   Sexual activity: Not on file  Lifestyle   Physical activity    Days per week: Not on file    Minutes per session: Not on file   Stress: Not on file  Relationships   Social connections    Talks on phone: Not on file    Gets together: Not on file    Attends religious service: Not on file    Active member of club or organization: Not on file    Attends meetings of clubs or organizations: Not on file    Relationship status: Not on file  Other Topics Concern  Not on file  Social History Narrative   Pt was adopted.  Lives alone in a one story home.  Has 2 children.  Retired from Veterinary surgeon work.     Education: some college.   Right hand   Tobacco use, amount per day now: N/A   Past tobacco use, amount per day: COLLEGE   How many years did you use tobacco: 4    Alcohol use (drinks per week): N/A   Diet:   Do you drink/eat things with caffeine: SODAS   Marital status:     WIDOW                            What year were you married? 1960   Do you live in a house, apartment, assisted living, condo, trailer, etc.? 1 STORY   Is it one or more stories? 1    How many persons live in your home? 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       NO                           Type and how often?   Do you have a living will? YES   Do you have a DNR form?       YES                            If not, do you want to discuss one?   Do you have signed POA/HPOA forms?    YES                    If so, please bring to you appointment        Review of Systems: A 12 point ROS discussed and pertinent  positives are indicated in the HPI above.  All other systems are negative.  Review of Systems  Constitutional: Negative for chills and fever.  Respiratory: Positive for shortness of breath. Negative for wheezing.   Cardiovascular: Negative for chest pain and palpitations.  Gastrointestinal: Negative for abdominal pain.  Neurological: Negative for headaches.  Psychiatric/Behavioral: Negative for behavioral problems and confusion.    Vital Signs: BP (!) 152/63    Pulse 92    Temp 97.9 F (36.6 C) (Oral)    Resp 16    Ht 5\' 3"  (1.6 m)    Wt 143 lb (64.9 kg)    SpO2 96%    BMI 25.33 kg/m   Physical Exam Vitals signs and nursing note reviewed.  Constitutional:      General: She is not in acute distress.    Appearance: Normal appearance.  Cardiovascular:     Rate and Rhythm: Normal rate and regular rhythm.     Heart sounds: Normal heart sounds. No murmur.  Pulmonary:     Effort: Pulmonary effort is normal. No respiratory distress.     Breath sounds: Normal breath sounds. No wheezing.  Skin:    General: Skin is warm and dry.  Neurological:     Mental Status: She is alert and oriented to person, place, and time.  Psychiatric:        Mood and Affect: Mood normal.        Behavior: Behavior normal.  Thought Content: Thought content normal.        Judgment: Judgment normal.      MD Evaluation Airway: WNL Heart: WNL Abdomen: WNL Chest/ Lungs: WNL ASA  Classification: 3 Mallampati/Airway Score: One   Imaging: Dg Chest 2 View  Result Date: 11/27/2018 CLINICAL DATA:  Cough, SOB. Hx of diabetes, COPD, chronic bronchitis. Former [phone]). EXAM: CHEST - 2 VIEW COMPARISON:  01/14/2018 FINDINGS: Cardiac silhouette is normal in size. No mediastinal or hilar masses or evidence of adenopathy. Lungs are hyperexpanded, but clear. No pleural effusion or pneumothorax. Skeletal structures are intact. IMPRESSION: No active cardiopulmonary disease. Electronically Signed   By: Lajean Manes M.D.   On: 11/27/2018 14:47   Xr C-arm No Report  Result Date: 11/04/2018 Please see Notes tab for imaging impression.   Labs:  CBC: Recent Labs    01/01/18 1008 04/04/18 1359 11/27/18 1204  WBC 4.1 4.3 5.4  HGB 13.4 13.7 13.5  HCT 38.5 39.5 39.4  PLT 314.0 331.0 298    COAGS: No results for input(s): INR, APTT in the last 8760 hours.  BMP: Recent Labs    01/01/18 1008 04/04/18 1359 11/27/18 1204  NA 138 139 138  K 4.0 4.1 3.6  CL 103 102 106  CO2 29 31 24   GLUCOSE 93 76 119*  BUN 20 16 20   CALCIUM 9.2 9.6 9.3  CREATININE 1.08 1.06 1.19*  GFRNONAA  --   --  43*  GFRAA  --   --  50*    LIVER FUNCTION TESTS: Recent Labs    01/01/18 1008 04/04/18 1359 11/27/18 1204  BILITOT 0.3 0.5 0.5  AST 22 17 24   ALT 11 8 13   ALKPHOS 65 66 68  PROT 8.1 8.2 7.2  ALBUMIN 3.8 3.7 3.1*     Assessment and Plan:  Myasthenia gravis exacerbation in need of plasma exchange. Plan for image-guided tunneled plasmapheresis catheter placement tentatively for 11/28/2018 in IR. Patient will be NPO at midnight. Afebrile and WBCs WNL. She does not take blood thinners. INR pending.  Risks and benefits discussed with the patient including, but not limited to bleeding, infection, vascular injury, pneumothorax which may require chest tube placement, air embolism or even death. All of the patient's questions were answered, patient is agreeable to proceed. Consent signed and in chart.   Thank you for this interesting consult.  I greatly enjoyed meeting Erica Drake and look forward to participating in their care.  A copy of this report was sent to the requesting provider on this date.  Electronically Signed: Earley Abide, PA-C 11/27/2018, 3:33 PM   I spent a total of 40 Minutes in face to face in clinical consultation, greater than 50% of which was counseling/coordinating care for myasthenia gravis exacerbation/tunneled plasmapheresis catheter placement.

## 2018-11-27 NOTE — Telephone Encounter (Signed)
She can go to the ER with cold-like symptoms.  She will need to go to [facility], not [facility].  If she is planning on going to the hospital, then no need for IVIG as this will only get washed out with plasmapheresis.  IVIG will not be administered in the hospital and we will likely hold off on future treatments, if she responds to plasmapheresis.   As I have mentioned previously to patient, she is having suboptimal response to IVIG and we are unable to use steroids, so the only option for acute therapy in myasthenia gravis exacerbation is plasmapheresis.   DP

## 2018-11-27 NOTE — Progress Notes (Signed)
RT NOTE: NIF -30 with good patient effort.

## 2018-11-27 NOTE — ED Notes (Signed)
Dinner tray ordered.

## 2018-11-27 NOTE — ED Triage Notes (Addendum)
Pt arrives POV for eval of Myasthenia Gravis exacerbation. Pt reports she saw her neurologist MD Posey Pronto for eval of progressing shortness of breath which she feels is due to her MG exacerbation. States MD Posey Pronto sent her here for plasmapheresis

## 2018-11-27 NOTE — Progress Notes (Signed)
PT performed NIF and Vital, NIF- 20 twice, VT-0.7

## 2018-11-27 NOTE — ED Provider Notes (Addendum)
[facility] EMERGENCY DEPARTMENT Provider Note   CSN: [id number] Arrival date & time: 11/27/18  1145     History   Chief Complaint Chief Complaint  Patient presents with   Shortness of Breath    HPI Erica Drake is a 83 y.o. female.     HPI Patient with shortness of breath cough and sent in by her outpatient neurologist for myasthenia gravis exacerbation.  Sent in for plasmapheresis.  Has had some nasal congestion.  Occasional cough.  She is on IVIG 3 times a week for myasthenia gravis but is having increasing weakness.  Had been holding off on plasmapheresis until now.  No fevers or chills.  To be seen by neurology upon arrival. Past Medical History:  Diagnosis Date   Allergic rhinitis    Arthritis    Breast cancer ([place])    Chronic bronchitis    some mild wheezing at change of weather-   Chronic fatigue    Complication of anesthesia    pt reports she always has to be admitted after surgery with pulmon. consult  d/t respiratory history   COPD mixed type ([place])    Decreased hearing    intol hearing aids. Deaf left ear, hearing aid right ear.   Diabetes mellitus without complication ([place])    Eustachian tube dysfunction    decreased hearing   Fibromyalgia    GERD (gastroesophageal reflux disease)    Nissen   Hx of myasthenia gravis    Insomnia    Interstitial cystitis    [facility]-Dr. Karsten Ro sees periodically.(occ. self caths at home)   Renal cyst    Vertigo     Patient Active Problem List   Diagnosis Date Noted   Nocturnal hypoxemia 04/07/2018   Type 2 diabetes mellitus without complication, without long-term current use of insulin ([place]) 12/16/2017   Depression, major, single episode, complete remission ([place]) 12/16/2017   Chronic pain of left knee 10/09/2017   Chronic pain of right knee 10/09/2017   Unilateral primary osteoarthritis, left knee 10/09/2017   Unilateral primary osteoarthritis, right knee  10/09/2017   Ductal carcinoma in situ (DCIS) of right breast 07/24/2017   Myasthenia gravis without acute exacerbation ([place]) 07/16/2017   H/O arthroscopy 03/06/2017   Pain 02/20/2017   Vaginal atrophy 08/16/2016   Acute medial meniscal tear 07/18/2016   Acute medial meniscal tear, left, subsequent encounter 07/18/2016   Renal cyst 03/12/2016   Asthmatic bronchitis, mild persistent, with acute exacerbation 12/29/2015   Urethral caruncle 07/26/2015   Chronic cholecystitis 01/14/2015   Angioedema 05/23/2014   Chronic interstitial cystitis 10/01/2011   INSOMNIA 05/31/2007   EUSTACHIAN TUBE DYSFUNCTION 04/30/2007   Seasonal and perennial allergic rhinitis 04/30/2007   COPD mixed type ([place]) 04/30/2007   Esophageal reflux 04/30/2007   Fibromyalgia 04/30/2007    Past Surgical History:  Procedure Laterality Date   BREAST BIOPSY Right 06/2017   x2   BREAST CYST ASPIRATION     CHOLECYSTECTOMY N/A 01/14/2015   Procedure: LAPAROSCOPIC CHOLECYSTECTOMY WITH INTRAOPERATIVE CHOLANGIOGRAM;  Surgeon: Johnathan Hausen, MD;  Location: [facility];  Service: General;  Laterality: N/A;   KNEE ARTHROSCOPY WITH MEDIAL MENISECTOMY Left 07/18/2016   Procedure: LEFT KNEE ARTHROSCOPY WITH MEDIAL MENISECTOMY dedribement and chrodralplasty;  Surgeon: Gaynelle Arabian, MD;  Location: [facility];  Service: Orthopedics;  Laterality: Left;   NISSEN FUNDOPLICATION     tx of GERD     OB History   No obstetric history on file.      Home Medications  Prior to Admission medications   Medication Sig Start Date End Date Taking? Authorizing Provider  albuterol (PROAIR HFA) 108 (90 Base) MCG/ACT inhaler Inhale 1-2 puffs into the lungs every 4 (four) hours as needed for wheezing or shortness of breath. 04/07/18   Baird Lyons D, MD  albuterol (PROVENTIL) (2.5 MG/3ML) 0.083% nebulizer solution Take 3 mLs (2.5 mg total) by nebulization every 6 (six) hours as needed for wheezing or shortness of breath.  01/16/18   Reed, Tiffany L, DO  Azathioprine 75 MG TABS Take 1 tablet (75 mg total) by mouth 2 (two) times daily. 01/01/18   Patel, Donika K, DO  buPROPion (WELLBUTRIN SR) 200 MG 12 hr tablet Take 200 mg by mouth 2 (two) times daily after a meal.     [provider]  clonazePAM (KLONOPIN) 1 MG disintegrating tablet DIS ONE T PO QD PRF PANIC ATTACK 05/26/18   [provider]  clonazePAM (KLONOPIN) 1 MG tablet  03/20/18   [provider]  dexlansoprazole (DEXILANT) 60 MG capsule Take 60 mg by mouth daily.    [provider]  doxycycline (VIBRA-TABS) 100 MG tablet  11/13/18   [provider]  GAMUNEX-C 5 GM/50ML SOLN  05/23/17   [provider]  Glycopyrrolate-Formoterol (BEVESPI AEROSPHERE) 9-4.8 MCG/ACT AERO Inhale 2 puffs into the lungs 2 (two) times daily. 04/07/18   Deneise Lever, MD  meclizine (ANTIVERT) 12.5 MG tablet Take 1 tablet (12.5 mg total) by mouth 3 (three) times daily as needed for dizziness. 12/24/16   Narda Amber K, DO  methylcellulose (CITRUCEL) oral powder Take daily by mouth.    [provider]  montelukast (SINGULAIR) 10 MG tablet Take by mouth. 10/16/17   [provider]  mycophenolate (CELLCEPT) 250 MG capsule Take by mouth 2 (two) times daily. 1 capsule daily for 5 days  2 capsules daily starting today 10/29/2018    [provider]  nitrofurantoin, macrocrystal-monohydrate, (MACROBID) 100 MG capsule  03/20/18   [provider]  pyridostigmine (MESTINON) 60 MG tablet TAKE 1 TABLET BY MOUTH 3 TIMES A DAY. 10/31/18   Patel, Donika K, DO  trimethoprim (TRIMPEX) 100 MG tablet Take 100 mg by mouth daily. 12/18/17   [provider]    Family History Family History  Adopted: Yes  Problem Relation Age of Onset   Asthma Mother        biologic   Breast cancer Mother        age 71   Asthma Son    Stroke Neg Hx    Neuropathy Neg Hx    Multiple sclerosis Neg Hx     Social  History Social History   Tobacco Use   Smoking status: Former Smoker    Types: Cigarettes    Quit date: 10/28/1960    Years since quitting: 58.1   Smokeless tobacco: Former Systems developer    Quit date: 01/09/1974   Tobacco comment: pt states she was a light smoker 9/2   Substance Use Topics   Alcohol use: No   Drug use: No     Allergies   Cortisone, Amlodipine besylate, Chlorhexidine gluconate, Clindamycin, Dexamethasone, Duloxetine, Morphine sulfate, Niacin, Other, Prednisone, Rosuvastatin calcium, Statins, Sulfa antibiotics, Valsartan, and Strawberry extract   Review of Systems Review of Systems  Constitutional: Negative for appetite change.  HENT: Positive for congestion.   Respiratory: Positive for cough.   Cardiovascular: Negative for chest pain.  Gastrointestinal: Negative for abdominal pain.  Genitourinary: Negative for flank pain.  Musculoskeletal: Negative  for back pain.  Skin: Negative for rash.  Neurological: Positive for weakness.  Psychiatric/Behavioral: Negative for confusion.     Physical Exam Updated Vital Signs BP (!) 152/63    Pulse 92    Temp 97.9 F (36.6 C) (Oral)    Resp 16    Ht 5\' 3"  (1.6 m)    Wt 64.9 kg    SpO2 96%    BMI 25.33 kg/m   Physical Exam Vitals signs and nursing note reviewed.  HENT:     Head: Atraumatic.  Cardiovascular:     Rate and Rhythm: Regular rhythm.  Pulmonary:     Effort: Pulmonary effort is normal.     Comments: Mildly harsh breath sounds. Chest:     Chest wall: No tenderness.  Abdominal:     Tenderness: There is no abdominal tenderness.  Skin:    Capillary Refill: Capillary refill takes less than 2 seconds.  Neurological:     Mental Status: She is alert.     Comments: Awake and pleasant.  May have mild eyelid droop.  Moving all extremities.      ED Treatments / Results  Labs (all labs ordered are listed, but only abnormal results are displayed) Labs Reviewed  CBC WITH DIFFERENTIAL/PLATELET - Abnormal;  Notable for the following components:      Result Value   MCH 34.2 (*)    All other components within normal limits  COMPREHENSIVE METABOLIC PANEL - Abnormal; Notable for the following components:   Glucose, Bld 119 (*)    Creatinine, Ser 1.19 (*)    Albumin 3.1 (*)    GFR calc non Af Amer 43 (*)    GFR calc Af Amer 50 (*)    All other components within normal limits  URINALYSIS, ROUTINE W REFLEX MICROSCOPIC    EKG EKG Interpretation  Date/Time:  Thursday November 27 2018 11:50:34 EDT Ventricular Rate:  86 PR Interval:  136 QRS Duration: 84 QT Interval:  332 QTC Calculation: 397 R Axis:   55 Text Interpretation:  Normal sinus rhythm Nonspecific ST and T wave abnormality Abnormal ECG No significant change since last tracing Confirmed by Davonna Belling [phone]) on 11/27/2018 1:20:31 PM   Radiology No results found.  Procedures Procedures (including critical care time)  Medications Ordered in ED Medications - No data to display   Initial Impression / Assessment and Plan / ED Course  I have reviewed the triage vital signs and the nursing notes.  Pertinent labs & imaging results that were available during my care of the patient were reviewed by me and considered in my medical decision making (see chart for details).        Patient with myasthenia gravis.  Worsening.  Sent in for plasmapheresis.  Seen by neurology.  Request medicine admission.  However has chronic bronchitis.  X-ray ordered but does not have localizing lung findings on exam.. Has negative COVID test from 2 days ago.  Final Clinical Impressions(s) / ED Diagnoses   Final diagnoses:  Myasthenia gravis with exacerbation [facility])    ED Discharge Orders    None       Davonna Belling, MD 11/27/18 1424    Davonna Belling, MD 11/27/18 1425

## 2018-11-27 NOTE — Telephone Encounter (Signed)
  Spoke to patient and explained and that no need for IVIG at this time as it will be washed out with plasmapheresis. Reviewed with patient all that Dr. Posey Pronto said in below note about the plasmaphoresis being the only acute therapy for MG exacerbation. Patient verbalized understanding and said she is getting ready and should be there around 11 or so to [facility] ER. Dr. Posey Pronto will page the neurohospitalist on call to make them aware she is coming for plasmaphoresis.   Patient wanted to make sure I noted and informed MD she is wearing the 2L [place] 02 at night as she will need this in the hospital overnight.

## 2018-11-28 ENCOUNTER — Encounter: Payer: Self-pay | Admitting: Physical Medicine and Rehabilitation

## 2018-11-28 ENCOUNTER — Observation Stay (HOSPITAL_COMMUNITY): Payer: Medicare Other

## 2018-11-28 ENCOUNTER — Other Ambulatory Visit: Payer: Self-pay

## 2018-11-28 DIAGNOSIS — M797 Fibromyalgia: Secondary | ICD-10-CM | POA: Diagnosis present

## 2018-11-28 DIAGNOSIS — Z452 Encounter for adjustment and management of vascular access device: Secondary | ICD-10-CM | POA: Diagnosis not present

## 2018-11-28 DIAGNOSIS — Z803 Family history of malignant neoplasm of breast: Secondary | ICD-10-CM | POA: Diagnosis not present

## 2018-11-28 DIAGNOSIS — D631 Anemia in chronic kidney disease: Secondary | ICD-10-CM | POA: Diagnosis present

## 2018-11-28 DIAGNOSIS — N3011 Interstitial cystitis (chronic) with hematuria: Secondary | ICD-10-CM | POA: Diagnosis present

## 2018-11-28 DIAGNOSIS — N390 Urinary tract infection, site not specified: Secondary | ICD-10-CM | POA: Diagnosis not present

## 2018-11-28 DIAGNOSIS — Z885 Allergy status to narcotic agent status: Secondary | ICD-10-CM | POA: Diagnosis not present

## 2018-11-28 DIAGNOSIS — Z8739 Personal history of other diseases of the musculoskeletal system and connective tissue: Secondary | ICD-10-CM | POA: Diagnosis not present

## 2018-11-28 DIAGNOSIS — R197 Diarrhea, unspecified: Secondary | ICD-10-CM | POA: Diagnosis present

## 2018-11-28 DIAGNOSIS — Z882 Allergy status to sulfonamides status: Secondary | ICD-10-CM | POA: Diagnosis not present

## 2018-11-28 DIAGNOSIS — Z881 Allergy status to other antibiotic agents status: Secondary | ICD-10-CM | POA: Diagnosis not present

## 2018-11-28 DIAGNOSIS — Z87891 Personal history of nicotine dependence: Secondary | ICD-10-CM | POA: Diagnosis not present

## 2018-11-28 DIAGNOSIS — E1122 Type 2 diabetes mellitus with diabetic chronic kidney disease: Secondary | ICD-10-CM | POA: Diagnosis present

## 2018-11-28 DIAGNOSIS — Z4901 Encounter for fitting and adjustment of extracorporeal dialysis catheter: Secondary | ICD-10-CM | POA: Diagnosis not present

## 2018-11-28 DIAGNOSIS — N179 Acute kidney failure, unspecified: Secondary | ICD-10-CM | POA: Diagnosis not present

## 2018-11-28 DIAGNOSIS — J449 Chronic obstructive pulmonary disease, unspecified: Secondary | ICD-10-CM | POA: Diagnosis not present

## 2018-11-28 DIAGNOSIS — Z79899 Other long term (current) drug therapy: Secondary | ICD-10-CM | POA: Diagnosis not present

## 2018-11-28 DIAGNOSIS — G7001 Myasthenia gravis with (acute) exacerbation: Secondary | ICD-10-CM | POA: Diagnosis present

## 2018-11-28 DIAGNOSIS — M47816 Spondylosis without myelopathy or radiculopathy, lumbar region: Secondary | ICD-10-CM | POA: Insufficient documentation

## 2018-11-28 DIAGNOSIS — Z825 Family history of asthma and other chronic lower respiratory diseases: Secondary | ICD-10-CM | POA: Diagnosis not present

## 2018-11-28 DIAGNOSIS — F419 Anxiety disorder, unspecified: Secondary | ICD-10-CM | POA: Diagnosis present

## 2018-11-28 DIAGNOSIS — R0902 Hypoxemia: Secondary | ICD-10-CM | POA: Diagnosis present

## 2018-11-28 DIAGNOSIS — R309 Painful micturition, unspecified: Secondary | ICD-10-CM | POA: Diagnosis not present

## 2018-11-28 DIAGNOSIS — Z888 Allergy status to other drugs, medicaments and biological substances status: Secondary | ICD-10-CM | POA: Diagnosis not present

## 2018-11-28 DIAGNOSIS — Z853 Personal history of malignant neoplasm of breast: Secondary | ICD-10-CM | POA: Diagnosis not present

## 2018-11-28 DIAGNOSIS — Z20828 Contact with and (suspected) exposure to other viral communicable diseases: Secondary | ICD-10-CM | POA: Diagnosis present

## 2018-11-28 DIAGNOSIS — N183 Chronic kidney disease, stage 3 unspecified: Secondary | ICD-10-CM | POA: Diagnosis present

## 2018-11-28 DIAGNOSIS — R399 Unspecified symptoms and signs involving the genitourinary system: Secondary | ICD-10-CM | POA: Diagnosis not present

## 2018-11-28 DIAGNOSIS — K219 Gastro-esophageal reflux disease without esophagitis: Secondary | ICD-10-CM | POA: Diagnosis present

## 2018-11-28 HISTORY — PX: IR FLUORO GUIDE CV LINE RIGHT: IMG2283

## 2018-11-28 HISTORY — PX: IR US GUIDE VASC ACCESS RIGHT: IMG2390

## 2018-11-28 LAB — BLOOD GAS, ARTERIAL
Acid-base deficit: 3.5 mmol/L — ABNORMAL HIGH (ref 0.0–2.0)
Bicarbonate: 22.1 mmol/L (ref 20.0–28.0)
Drawn by: 519031
FIO2: 21
O2 Saturation: 93.2 %
Patient temperature: 98.4
pCO2 arterial: 47.9 mmHg (ref 32.0–48.0)
pH, Arterial: 7.286 — ABNORMAL LOW (ref 7.350–7.450)
pO2, Arterial: 71.6 mmHg — ABNORMAL LOW (ref 83.0–108.0)

## 2018-11-28 LAB — CBC
HCT: 36.2 % (ref 36.0–46.0)
Hemoglobin: 12 g/dL (ref 12.0–15.0)
MCH: 33.6 pg (ref 26.0–34.0)
MCHC: 33.1 g/dL (ref 30.0–36.0)
MCV: 101.4 fL — ABNORMAL HIGH (ref 80.0–100.0)
Platelets: 266 10*3/uL (ref 150–400)
RBC: 3.57 MIL/uL — ABNORMAL LOW (ref 3.87–5.11)
RDW: 13.9 % (ref 11.5–15.5)
WBC: 5.3 10*3/uL (ref 4.0–10.5)
nRBC: 0 % (ref 0.0–0.2)

## 2018-11-28 LAB — BASIC METABOLIC PANEL
Anion gap: 8 (ref 5–15)
BUN: 16 mg/dL (ref 8–23)
CO2: 28 mmol/L (ref 22–32)
Calcium: 8.7 mg/dL — ABNORMAL LOW (ref 8.9–10.3)
Chloride: 104 mmol/L (ref 98–111)
Creatinine, Ser: 1.02 mg/dL — ABNORMAL HIGH (ref 0.44–1.00)
GFR calc Af Amer: >60 mL/min (ref >60)
GFR calc non Af Amer: 52 mL/min — ABNORMAL LOW (ref >60)
Glucose, Bld: 89 mg/dL (ref 70–99)
Potassium: 3.8 mmol/L (ref 3.5–5.1)
Sodium: 140 mmol/L (ref 135–145)

## 2018-11-28 LAB — PROTIME-INR
INR: 0.9 (ref 0.8–1.2)
Prothrombin Time: 12.2 seconds (ref 11.4–15.2)

## 2018-11-28 LAB — MRSA PCR SCREENING: MRSA by PCR: NEGATIVE

## 2018-11-28 MED ORDER — HEPARIN SODIUM (PORCINE) 1000 UNIT/ML IJ SOLN
INTRAMUSCULAR | Status: AC
  Filled 2018-11-28: qty 1

## 2018-11-28 MED ORDER — SODIUM CHLORIDE 0.9 % IV SOLN
1.0000 g | INTRAVENOUS | Status: DC
  Administered 2018-11-28 – 2018-12-02 (×5): 1 g via INTRAVENOUS
  Filled 2018-11-28 (×2): qty 1
  Filled 2018-11-28: qty 10
  Filled 2018-11-28 (×3): qty 1

## 2018-11-28 MED ORDER — ACD FORMULA A 0.73-2.45-2.2 GM/100ML VI SOLN
Status: AC
  Filled 2018-11-28: qty 500

## 2018-11-28 MED ORDER — ACD FORMULA A 0.73-2.45-2.2 GM/100ML VI SOLN
500.0000 mL | Status: DC
  Administered 2018-11-29: 500 mL via INTRAVENOUS

## 2018-11-28 MED ORDER — DIPHENHYDRAMINE HCL 25 MG PO CAPS
25.0000 mg | ORAL_CAPSULE | Freq: Four times a day (QID) | ORAL | Status: DC | PRN
  Administered 2018-11-29: 25 mg via ORAL

## 2018-11-28 MED ORDER — HEPARIN SODIUM (PORCINE) 1000 UNIT/ML IJ SOLN
INTRAMUSCULAR | Status: AC
  Administered 2018-11-28: 3200 [IU]
  Filled 2018-11-28: qty 3

## 2018-11-28 MED ORDER — SODIUM CHLORIDE 0.9 % IV SOLN
2.0000 g | Freq: Once | INTRAVENOUS | Status: AC
  Administered 2018-11-28: 2 g via INTRAVENOUS
  Filled 2018-11-28: qty 20

## 2018-11-28 MED ORDER — FENTANYL CITRATE (PF) 100 MCG/2ML IJ SOLN
INTRAMUSCULAR | Status: AC | PRN
  Administered 2018-11-28: 50 ug via INTRAVENOUS

## 2018-11-28 MED ORDER — CALCIUM CARBONATE ANTACID 500 MG PO CHEW
CHEWABLE_TABLET | ORAL | Status: AC
  Administered 2018-11-28: 400 mg via ORAL
  Filled 2018-11-28: qty 4

## 2018-11-28 MED ORDER — CEFAZOLIN SODIUM-DEXTROSE 2-4 GM/100ML-% IV SOLN
INTRAVENOUS | Status: AC
  Filled 2018-11-28: qty 100

## 2018-11-28 MED ORDER — SODIUM CHLORIDE 0.9 % IV SOLN
INTRAVENOUS | Status: AC
  Administered 2018-11-28 (×3): via INTRAVENOUS_CENTRAL
  Filled 2018-11-28 (×3): qty 200

## 2018-11-28 MED ORDER — CEFAZOLIN SODIUM-DEXTROSE 1-4 GM/50ML-% IV SOLN
INTRAVENOUS | Status: AC | PRN
  Administered 2018-11-28: 2 g via INTRAVENOUS

## 2018-11-28 MED ORDER — ACETAMINOPHEN 325 MG PO TABS
650.0000 mg | ORAL_TABLET | ORAL | Status: DC | PRN
  Administered 2018-11-29: 650 mg via ORAL

## 2018-11-28 MED ORDER — ONDANSETRON HCL 4 MG/2ML IJ SOLN
INTRAMUSCULAR | Status: AC | PRN
  Administered 2018-11-28: 4 mg via INTRAVENOUS

## 2018-11-28 MED ORDER — CALCIUM CARBONATE ANTACID 500 MG PO CHEW
2.0000 | CHEWABLE_TABLET | ORAL | Status: AC
  Administered 2018-11-28: 18:00:00 400 mg via ORAL

## 2018-11-28 MED ORDER — MIDAZOLAM HCL 2 MG/2ML IJ SOLN
INTRAMUSCULAR | Status: AC | PRN
  Administered 2018-11-28: 0.5 mg via INTRAVENOUS

## 2018-11-28 MED ORDER — GELATIN ABSORBABLE 12-7 MM EX MISC
CUTANEOUS | Status: AC
  Filled 2018-11-28: qty 1

## 2018-11-28 MED ORDER — LIDOCAINE HCL (PF) 1 % IJ SOLN
INTRAMUSCULAR | Status: AC | PRN
  Administered 2018-11-28: 10 mL

## 2018-11-28 MED ORDER — FENTANYL CITRATE (PF) 100 MCG/2ML IJ SOLN
INTRAMUSCULAR | Status: AC
  Filled 2018-11-28: qty 2

## 2018-11-28 MED ORDER — HEPARIN SODIUM (PORCINE) 1000 UNIT/ML IJ SOLN
1000.0000 [IU] | Freq: Once | INTRAMUSCULAR | Status: AC
  Administered 2018-11-28: 19:00:00 3200 [IU]

## 2018-11-28 MED ORDER — ONDANSETRON HCL 4 MG/2ML IJ SOLN
INTRAMUSCULAR | Status: AC
  Filled 2018-11-28: qty 2

## 2018-11-28 MED ORDER — LIDOCAINE HCL 1 % IJ SOLN
INTRAMUSCULAR | Status: AC
  Filled 2018-11-28: qty 20

## 2018-11-28 MED ORDER — MIDAZOLAM HCL 2 MG/2ML IJ SOLN
INTRAMUSCULAR | Status: AC
  Filled 2018-11-28: qty 2

## 2018-11-28 NOTE — Progress Notes (Signed)
PROGRESS NOTE  Erica Drake [id number] DOB: Jul 03, 1935 DOA: 11/27/2018 PCP: Prince Solian, MD  Brief History   Erica Drake is a 83 y.o. female with medical history significant of myasthenia gravis, breast cancer, interstitial cystitis, fibromyalgia, presents to the emergency department due to worsening of weakness since couple of months.  Patient has myasthenia gravis and she is on IVIG every 3 weeks.  Patient reports since last few weeks she is more weak, has neck pain, double vision, low energy, drooping of face and wheezing.  As per daughter at bedside reports  jaw tightness and difficulty swallowing.  Her current regime for myasthenia gravis: IVIG every 3 weeks, azathioprine 75 mg p.o. twice daily and Mestinon 60 mg 3 times daily.  She is compliant with her medication and she took all of her home medicines this morning.  She recently evaluated by her pulmonologist and placed her on 2 L of oxygen via nasal cannulae at bedtime due to low oxygen saturation.  Due to her worsening weakness she was told by her neurologist Dr. Posey Pronto to go to the emergency department for plasma exchange.  Patient reports dysuria, lower abdominal pain, hematuria since couple of days.  She has history of chronic interstitial cystitis.  She denies fever, chills, cough, congestion, runny nose, sore throat, recent COVID-19 exposure, headache, lightheadedness, dizziness, nausea, vomiting, diarrhea, decreased appetite, weight loss.  She lives alone at independent living facility and she is independent on her daily life activities.  She denies smoking, alcohol, illicit drug use.  She is compliant with her IVIG appointments.  ED Course: Patient is afebrile, no leukocytosis, neurology consulted by EDP-recommended plasmapheresis.  Chest x-ray is negative.  COVID test negative.  The patient was admitted to a step down bed. Her NIF is being monitored by respiratory therapy. Neurology is consulted. She has had a  Rt IJ tunnelled catheter placed for immediate use. She will be receiving plasma exchange to treat her myasthenia gravis exacerbation. She is on IVIG as outpatient. She states that lately it has not been helping. She is also found to have a UTI, and is being treated with IV ancef.  Consultants   Neurology  Interventional Radiology  Procedures   Placement of tunneled Catheter in Right IJ  Plasma exchange - pending  Antibiotics   Anti-infectives (From admission, onward)   Start     Dose/Rate Route Frequency Ordered Stop   11/28/18 0852  ceFAZolin (ANCEF) IVPB 1 g/50 mL premix     over 30 Minutes  Continuous PRN 11/28/18 0852 11/28/18 0842   11/28/18 0834  ceFAZolin (ANCEF) 2-4 GM/100ML-% IVPB    Note to Pharmacy: Erica Drake   : cabinet override      11/28/18 0834 11/28/18 2044      Subjective  The patient is resting comfortably. She states that she feels that she is breathing shallowly.  Objective   Vitals:  Vitals:   11/28/18 1800 11/28/18 1806  BP: (!) 127/57 (!) 127/57  Pulse: 85 87  Resp: 15 15  Temp: 98.4 F (36.9 C) 98.4 F (36.9 C)  SpO2:     Exam:  Constitutional:   The patient is awake, alert, and oriented x 3. No acute distress. Respiratory:   No increased work of breathing.  Shallow respirations, but no tachypnea  No wheezes, rales, or rhonchi  No tactile fremitus Cardiovascular:   Regular rate and rhythm  No murmurs, ectopy, or gallups.  No lateral PMI. No thrills. Abdomen:   Abdomen is soft,  non-tender, non-distended  No hernias, masses, or organomegaly  Normoactive bowel sounds.  Musculoskeletal:   No cyanosis, clubbing, or edema Skin:   No rashes, lesions, ulcers  palpation of skin: no induration or nodules Neurologic:   CN 2-12 intact  Sensation all 4 extremities intact Psychiatric:   Mental status o Mood, affect appropriate o Orientation to person, place, time   judgment and insight appear intact  I have  personally reviewed the following:   Today's Data   Vitals, Respiratory data, CBC, CMP  Scheduled Meds:  azaTHIOprine  75 mg Oral BID   buPROPion  200 mg Oral BID PC   calcium carbonate       calcium carbonate  2 tablet Oral Q3H   fentaNYL       gelatin adsorbable       heparin       heparin  1,000 Units Intracatheter Once   lidocaine       midazolam       nitrofurantoin (macrocrystal-monohydrate)  100 mg Oral Q12H   ondansetron       pyridostigmine  60 mg Oral TID PC   Continuous Infusions:  therapeutic plasma exchange solution     calcium gluconate IVPB     ceFAZolin     citrate dextrose     citrate dextrose      Principal Problem:   Myasthenia gravis with exacerbation (HCC) Active Problems:   COPD mixed type (HCC)   Fibromyalgia   Nocturnal hypoxemia   Anxiety   Acute lower UTI   AKI (acute kidney injury) ([place])   Myasthenia gravis in crisis ([place])   LOS: 0 days   A & P  Myasthenia gravis with exacerbation: Patient has history of seropositive myasthenia gravis and she is on IVIG every 3 weeks, azathioprine 75 mg p.o. twice daily and Mestinon 60 mg 3 times daily.  Presented with worsening of weakness. She states that she doesn't think that the IVIG has been helping for some time. Neurology has been consulted. She has had a Right IJ tunneled catheter placed for plasma exchange which is to begin today. She has been getting NIF and VC checks q shift, but only one has been done. It demonstrated a poor NIF at (-20) and VC of .6L. Oxygen saturations have remained stable at 97%. The patient is not on high dose steroids due to a stated allergy resulting in shortness of breath and tongue swelling. I have discussed the patient in detail with Dr. Ruthann Cancer, who stated that she would put her on her list. I will also discuss the patient with the hospitalist team coming on overnight. I have asked that nursing request a stat NIF and VC on the patient now. I have also  ordered a stat ABG. Dr. Ruthann Cancer feels that the patient likely only requires BIPAP.    Urinary tract infection: Patietn ihas been started on ceftriaxone. Patient is afebrile, no leukocytosis. UA is positive for leukocytes, patient is complaining of dysuria, hematuria, lower abdominal pain. Urine culture is pending.  AKI on CKD: Monitor kidney function, avoid nephrotoxic medication and hypotension. Monitor creatinine, electrolytes, and volume status.  COPD: Not in acute exacerbation. Patient is maintaining oxygen saturation on room air. On continuous pulse ox. We will continue home inhalers.  Anxiety: Stable. Will avoid use of anxiolytics at this time due to low NIF and VC. Continue  Wellbutrin.  I have seen and examined this patient myself. I have spent 46 minutes in her evaluation and care.  DVT prophylaxis: TED/SCD Code Status: Full code Family Communication: None available. Disposition Plan: To be determined   Jadira Nierman, DO [facility] Direct contact: see [email or web address]  7PM-7AM contact night coverage as above 11/28/2018, 6:10 PM  LOS: 0 days

## 2018-11-28 NOTE — Progress Notes (Signed)
Erica Drake - 83 y.o. female MRN [id number]  Date of birth: 07/10/1935  Office Visit Note: Visit Date: 11/04/2018 PCP: Erica Solian, MD Referred by: Erica Solian, MD  Subjective: Chief Complaint  Patient presents with  . Lower Back - Pain   HPI: Erica Drake is a 83 y.o. female who comes in today For hydration management of chronic worsening axial low back pain.  She is followed in the office mainly by Erica Drake.  I originally saw her through him for intra-articular hip injection.  We performed to left intra-articular hip injections with decent relief of her symptoms despite the fact that there is really minimal changes on x-ray of the hip.  She has had MRI of the lumbar spine performed at the beginning of the year and this shows multilevel facet arthropathy but without stenosis or nerve compression or disc herniations etc.  She suffers from myasthenia gravis.  She is followed by Erica Drake at [facility] neurology.  She reports chronic worsening severe back pain worse for many many years without any relief from any medications or treatment to date.  She does carry a history of fibromyalgia and does have 17 drug intolerances and allergies.  Her findings on imaging are very minimal for her age.  She still tries to be active.  She has no radicular pain or weakness.  No bowel or bladder changes.  She does get weakness at times but is still the myasthenia gravis.  She was treated at 1 point by Erica Drake with injections that she felt like were at the L4-5 region.  She has had difficulty with prednisone in the past but has done okay with cortisone injections.  Erica Drake has seen her since I saw her last and completed injection for her knees.  Review of Systems  Constitutional: Negative for chills, fever, malaise/fatigue and weight loss.  HENT: Negative for hearing loss and sinus pain.   Eyes: Negative for blurred vision, double vision and photophobia.   Respiratory: Negative for cough and shortness of breath.   Cardiovascular: Negative for chest pain, palpitations and leg swelling.  Gastrointestinal: Negative for abdominal pain, nausea and vomiting.  Genitourinary: Negative for flank pain.  Musculoskeletal: Positive for back pain, falls and joint pain. Negative for myalgias.  Skin: Negative for itching and rash.  Neurological: Positive for weakness. Negative for tremors and focal weakness.  Endo/Heme/Allergies: Negative.   Psychiatric/Behavioral: Negative for depression.  All other systems reviewed and are negative.  Otherwise per HPI.  Assessment & Plan: Visit Diagnoses:  1. Spondylosis without myelopathy or radiculopathy, lumbar region   2. Chronic bilateral low back pain without sciatica   3. Pain in left hip   4. Fibromyalgia     Plan: Findings:  Complicated picture of chronic low back pain chronic joint pain in the setting of fibromyalgia and arthritic changes but without any real severe arthritis or severe compression in the spine.  She has been treated in the past with injections with varying amounts of relief.  We have seen her for her hips through Erica Drake and she did get some relief with cortisone injection although not a great deal of arthritis noted on the x-rays.  Underlying fibromyalgia is probably a significant issue.  We will complete diagnostic medial branch blocks of the lower facet joints and see how she does.  If she does well with that would look at double block paradigm and may be radiofrequency ablation for more definitive care.  We have talked about activity modification and strengthening even in the setting of myasthenia gravis.    Meds & Orders:  Meds ordered this encounter  Medications  . methylPREDNISolone acetate (DEPO-MEDROL) injection 80 mg    Orders Placed This Encounter  Procedures  . Facet Injection  . XR C-ARM NO REPORT    Follow-up: Return if symptoms worsen or fail to improve, for Review  Pain Diary.   Procedures: No procedures performed  Lumbar Diagnostic Facet Joint Nerve Block with Fluoroscopic Guidance   Patient: Erica Drake      Date of Birth: 07-21-1935 MRN: [id number] PCP: Erica Solian, MD      Visit Date: 11/04/2018   Universal Protocol:    Date/Time: 10/02/205:58 AM  Consent Given By: the patient  Position: PRONE  Additional Comments: Vital signs were monitored before and after the procedure. Patient was prepped and draped in the usual sterile fashion. The correct patient, procedure, and site was verified.   Injection Procedure Details:  Procedure Site One Meds Administered:  Meds ordered this encounter  Medications  . methylPREDNISolone acetate (DEPO-MEDROL) injection 80 mg     Laterality: Bilateral  Location/Site:  L4-L5 L5-S1  Needle size: 22 ga.  Needle type:spinal  Needle Placement: Oblique pedical  Findings:   -Comments: There was excellent flow of contrast along the articular pillars without intravascular flow.  Procedure Details: The fluoroscope beam is vertically oriented in AP and then obliqued 15 to 20 degrees to the ipsilateral side of the desired nerve to achieve the "Scotty dog" appearance.  The skin over the target area of the junction of the superior articulating process and the transverse process (sacral ala if blocking the L5 dorsal rami) was locally anesthetized with a 1 ml volume of 1% Lidocaine without Epinephrine.  The spinal needle was inserted and advanced in a trajectory view down to the target.   After contact with periosteum and negative aspirate for blood and CSF, correct placement without intravascular or epidural spread was confirmed by injecting 0.5 ml. of Isovue-250.  A spot radiograph was obtained of this image.    Next, a 0.5 ml. volume of the injectate described above was injected. The needle was then redirected to the other facet joint nerves mentioned above if needed.  Prior to the procedure,  the patient was given a Pain Diary which was completed for baseline measurements.  After the procedure, the patient rated their pain every 30 minutes and will continue rating at this frequency for a total of 5 hours.  The patient has been asked to complete the Diary and return to [place] by mail, fax or hand delivered as soon as possible.   Additional Comments:  The patient tolerated the procedure well Dressing: 2 x 2 sterile gauze and Band-Aid    Post-procedure details: Patient was observed during the procedure. Post-procedure instructions were reviewed.  Patient left the clinic in stable condition.   Clinical History: MRI LUMBAR SPINE WITHOUT CONTRAST  TECHNIQUE: Multiplanar, multisequence MR imaging of the lumbar spine was performed. No intravenous contrast was administered.  COMPARISON:  Prior radiograph from 02/25/2018 as well as previous MRI from 03/02/2015.  FINDINGS: Segmentation: Normal segmentation. Lowest well-formed disc labeled the L5-S1 level.  Alignment: Trace anterolisthesis of L5 on S1, stable. Alignment otherwise normal with preservation of the normal lumbar lordosis.  Vertebrae: Vertebral body heights maintained without evidence for acute or chronic fracture. Bone marrow signal intensity diffusely heterogeneous without discrete or worrisome osseous lesions. No abnormal marrow edema.  Conus medullaris and cauda equina: Conus extends to the L1 level. Conus and cauda equina appear normal.  Paraspinal and other soft tissues: Paraspinous soft tissues demonstrate no acute finding. Few scattered T2 hyperintense cyst noted within the kidneys bilaterally. Visualized visceral structures otherwise unremarkable.  Disc levels:  L1-2: Negative interspace. Mild bilateral facet hypertrophy. No canal or foraminal stenosis. Appearance is stable.  L2-3: Mild circumferential disc bulge with disc desiccation and intervertebral disc space narrowing, mildly  progressed from previous. Moderate facet and ligament flavum hypertrophy. No significant canal or lateral recess narrowing. Mild left greater than right L2 foraminal stenosis. Changes have mildly progressed from previous.  L3-4: Chronic intervertebral disc space narrowing with diffuse disc bulge and disc desiccation. Moderate facet and ligament flavum hypertrophy. No significant spinal stenosis. Mild left with mild to moderate right L3 foraminal stenosis, mildly progressed from previous.  L4-5: Diffuse circumferential disc bulge with disc desiccation and intervertebral disc space narrowing. Shallow right central disc protrusion indents the right ventral thecal sac. Superimposed moderate facet and ligament flavum hypertrophy. Resultant mild canal with right greater than left lateral recess narrowing, mildly progressed from previous. Mild left with mild to moderate right L4 foraminal narrowing, also minimally progressed.  L5-S1: Trace anterolisthesis. Advanced bilateral facet arthrosis. No canal or lateral recess stenosis. Foramina remain patent.  IMPRESSION: Mild for age multilevel disc degeneration with moderate multilevel facet arthrosis without frank neural impingement. Overall, degenerative changes have mildly progressed relative to most recent MRI from 2017.   Electronically Signed   By: Jeannine Boga M.D.   On: 03/07/2018 16:33   She reports that she quit smoking about 58 years ago. Her smoking use included cigarettes. She quit smokeless tobacco use about 44 years ago. No results for input(s): HGBA1C, LABURIC in the last 8760 hours.  Objective:  VS:  HT:    WT:   BMI:     BP:(!) 156/75  HR:83bpm  TEMP: ( )  RESP:94 % Physical Exam Vitals signs and nursing note reviewed.  Constitutional:      General: She is not in acute distress.    Appearance: Normal appearance. She is well-developed. She is not ill-appearing.  HENT:     Head: Normocephalic and  atraumatic.  Eyes:     Conjunctiva/sclera: Conjunctivae normal.     Pupils: Pupils are equal, round, and reactive to light.  Cardiovascular:     Rate and Rhythm: Normal rate.     Pulses: Normal pulses.  Pulmonary:     Effort: Pulmonary effort is normal.  Musculoskeletal:     Right lower leg: No edema.     Left lower leg: No edema.     Comments: Patient slow to arise from a seated position to full extension she does have concordant pain with facet loading and extension rotation of the lumbar spine.  She has tender points really all over the lower spine and PSIS.  No real focal pain to palpation over the greater trochanters and no real pain with rotation of the hips.  She has good distal strength.  Skin:    General: Skin is warm and dry.     Findings: No erythema or rash.  Neurological:     General: No focal deficit present.     Mental Status: She is alert and oriented to person, place, and time.     Sensory: No sensory deficit.     Motor: No abnormal muscle tone.     Coordination: Coordination normal.     Gait: Gait  abnormal.  Psychiatric:        Mood and Affect: Mood normal.        Behavior: Behavior normal.     Ortho Exam Imaging: Dg Chest 2 View  Result Date: 11/27/2018 CLINICAL DATA:  Cough, SOB. Hx of diabetes, COPD, chronic bronchitis. Former [phone]). EXAM: CHEST - 2 VIEW COMPARISON:  01/14/2018 FINDINGS: Cardiac silhouette is normal in size. No mediastinal or hilar masses or evidence of adenopathy. Lungs are hyperexpanded, but clear. No pleural effusion or pneumothorax. Skeletal structures are intact. IMPRESSION: No active cardiopulmonary disease. Electronically Signed   By: Lajean Manes M.D.   On: 11/27/2018 14:47    Past Medical/Family/Surgical/Social History: Medications & Allergies reviewed per EMR, new medications updated. Patient Active Problem List   Diagnosis Date Noted  . Spondylosis without myelopathy or radiculopathy, lumbar region 11/28/2018  .  Myasthenia gravis with exacerbation ([place]) 11/27/2018  . Anxiety 11/27/2018  . Acute lower UTI 11/27/2018  . AKI (acute kidney injury) ([place]) 11/27/2018  . Nocturnal hypoxemia 04/07/2018  . Type 2 diabetes mellitus without complication, without long-term current use of insulin ([place]) 12/16/2017  . Depression, major, single episode, complete remission ([place]) 12/16/2017  . Chronic pain of left knee 10/09/2017  . Chronic pain of right knee 10/09/2017  . Unilateral primary osteoarthritis, left knee 10/09/2017  . Unilateral primary osteoarthritis, right knee 10/09/2017  . 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 Myasthenia gravis without acute exacerbation ([place]) 07/16/2017  . H/O arthroscopy 03/06/2017  . Pain 02/20/2017  . Vaginal atrophy 08/16/2016  . Acute medial meniscal tear 07/18/2016  . Acute medial meniscal tear, left, subsequent encounter 07/18/2016  . Renal cyst 03/12/2016  . Asthmatic bronchitis, mild persistent, with acute exacerbation 12/29/2015  . Urethral caruncle 07/26/2015  . Chronic cholecystitis 01/14/2015  . Angioedema 05/23/2014  . Chronic interstitial cystitis 10/01/2011  . INSOMNIA 05/31/2007  . EUSTACHIAN TUBE DYSFUNCTION 04/30/2007  . Seasonal and perennial allergic rhinitis 04/30/2007  . COPD mixed type ([place]) 04/30/2007  . Esophageal reflux 04/30/2007  . Fibromyalgia 04/30/2007   Past Medical History:  Diagnosis Date  . Allergic rhinitis   . Arthritis   . Breast cancer ([place])   . Chronic bronchitis    some mild wheezing at change of weather-  . Chronic fatigue   . Complication of anesthesia    pt reports she always has to be admitted after surgery with pulmon. consult  d/t respiratory history  . COPD mixed type ([place])   . Decreased hearing    intol hearing aids. Deaf left ear, hearing aid right ear.  . Diabetes mellitus without complication ([place])   . Eustachian tube dysfunction    decreased hearing  . Fibromyalgia   . GERD  (gastroesophageal reflux disease)    [place]  . Hx of myasthenia gravis   . Insomnia   . Interstitial cystitis    [facility]-Dr. Karsten Ro sees periodically.(occ. self caths at home)  . Renal cyst   . Vertigo    Family History  Adopted: Yes  Problem Relation Age of Onset  . Asthma Mother        biologic  . Breast cancer Mother        age 58  . Asthma Son   . Stroke Neg Hx   . Neuropathy Neg Hx   . Multiple sclerosis Neg Hx    Past Surgical History:  Procedure Laterality Date  . BREAST BIOPSY Right 06/2017   x2  . BREAST CYST ASPIRATION    .  CHOLECYSTECTOMY N/A 01/14/2015   Procedure: LAPAROSCOPIC CHOLECYSTECTOMY WITH INTRAOPERATIVE CHOLANGIOGRAM;  Surgeon: Johnathan Hausen, MD;  Location: [facility];  Service: General;  Laterality: N/A;  . KNEE ARTHROSCOPY WITH MEDIAL MENISECTOMY Left 07/18/2016   Procedure: LEFT KNEE ARTHROSCOPY WITH MEDIAL MENISECTOMY dedribement and chrodralplasty;  Surgeon: Gaynelle Arabian, MD;  Location: [facility];  Service: Orthopedics;  Laterality: Left;  . NISSEN FUNDOPLICATION     tx of GERD   Social History   Occupational History  . Occupation: retired  Tobacco Use  . Smoking status: Former Smoker    Types: Cigarettes    Quit date: 10/28/1960    Years since quitting: 58.1  . Smokeless tobacco: Former Systems developer    Quit date: 01/09/1974  . Tobacco comment: pt states she was a light smoker 9/2   Substance and Sexual Activity  . Alcohol use: No  . Drug use: No  . Sexual activity: Not on file

## 2018-11-28 NOTE — Evaluation (Signed)
Physical Therapy Evaluation Patient Details Name: Erica Drake MRN: [id number] DOB: 1936-01-23 Today's Date: 11/28/2018   History of Present Illness  pt is an 83 yo female admitted for myasthenia gravis exacerbation and in hospital for IV plasmaphoresis. PMH: breast cancer, fibromyalgia, L death, vertigo  Clinical Impression  Pt is weak in her LEs L mildly more than R with seated MMT.  She has poor activity tolerance, overheats quickly and does not recover well the next day from strenuous activity.  She did much better walking around the room with the support of the RW.  I am not sure what [facility]'s policies are right now, but I would like her to have follow up HHPT at discharge if this is an option.   PT to follow acutely for deficits listed below.     Follow Up Recommendations Home health PT    Equipment Recommendations  None recommended by PT    Recommendations for Other Services   NA     Precautions / Restrictions Precautions Precautions: Fall Precaution Comments: per pt and daughter h/o stumbles Restrictions Weight Bearing Restrictions: No      Mobility  Bed Mobility Overal bed mobility: Modified Independent                Transfers Overall transfer level: Needs assistance   Transfers: Sit to/from Stand Sit to Stand: Min assist            Ambulation/Gait Ambulation/Gait assistance: Min assist Gait Distance (Feet): 45 Feet(30'x1, 15'x1) Assistive device: Rolling walker (2 wheeled) Gait Pattern/deviations: Step-through pattern;Shuffle     General Gait Details: Attempted marching in standing without AD and pt very unsteady, so switched to AD and pt did much better with this, so progressed to gait around the room with RW.  Encouraged her to use RW at home now until she feels stronger and gets over this exacerbation.          Balance Overall balance assessment: Needs assistance Sitting-balance support: Feet supported;Bilateral upper extremity  supported Sitting balance-Leahy Scale: Good Sitting balance - Comments: supervision EOB   Standing balance support: Bilateral upper extremity supported Standing balance-Leahy Scale: Poor Standing balance comment: needs external support in standing, very poor static standing balance.                              Pertinent Vitals/Pain Pain Assessment: No/denies pain    Home Living Family/patient expects to be discharged to:: Private residence Living Arrangements: Alone;Other (Comment)(independent care at [facility])   Type of Home: Independent living facility([facility] 2 bedroom)       Home Layout: One level   Additional Comments: housekeeping 1 x week and can provided meal and transportation  to appointments    Prior Function Level of Independence: Independent;Needs assistance   Gait / Transfers Assistance Needed: furniture walks, sometimes uses cane or walker, drives to get her food from the dining hall (pick up ordering right now).      Comments: has a dog named [employer]   Dominant Hand: Right    Extremity/Trunk Assessment   Upper Extremity Assessment Upper Extremity Assessment: Defer to OT evaluation    Lower Extremity Assessment Lower Extremity Assessment: RLE deficits/detail;LLE deficits/detail RLE Deficits / Details: right leg mildly stronger than left 3+/5 knee and hip, 3/5 ankle DF RLE Sensation: WNL LLE Deficits / Details: left leg is weaker than R mildly.  3+/5 knee and hip, 3-/5  ankle LLE Sensation: WNL    Cervical / Trunk Assessment Cervical / Trunk Assessment: Normal  Communication   Communication: No difficulties  Cognition Arousal/Alertness: Awake/alert Behavior During Therapy: WFL for tasks assessed/performed Overall Cognitive Status: Within Functional Limits for tasks assessed                                        General Comments General comments (skin integrity, edema, etc.): reports at  baseline vestibular deficits and describes BPPV without any previous treatment other than medication        Assessment/Plan    PT Assessment Patient needs continued PT services  PT Problem List Decreased strength;Decreased activity tolerance;Decreased balance;Decreased mobility;Decreased knowledge of use of DME;Decreased knowledge of precautions       PT Treatment Interventions DME instruction;Gait training;Functional mobility training;Therapeutic activities;Balance training;Therapeutic exercise;Neuromuscular re-education;Cognitive remediation;Patient/family education    PT Goals (Current goals can be found in the Care Plan section)  Acute Rehab PT Goals Patient Stated Goal: to get back to dog Posey PT Goal Formulation: With patient Time For Goal Achievement: 12/12/18 Potential to Achieve Goals: Good    Frequency Min 3X/week           AM-PAC PT "6 Clicks" Mobility  Outcome Measure Help needed turning from your back to your side while in a flat bed without using bedrails?: A Little Help needed moving from lying on your back to sitting on the side of a flat bed without using bedrails?: A Little Help needed moving to and from a bed to a chair (including a wheelchair)?: A Little Help needed standing up from a chair using your arms (e.g., wheelchair or bedside chair)?: A Little Help needed to walk in hospital room?: A Little Help needed climbing 3-5 steps with a railing? : A Lot 6 Click Score: 17    End of Session   Activity Tolerance: Patient limited by fatigue Patient left: Other (comment)(in bathroom with daughter's supervision)   PT Visit Diagnosis: Muscle weakness (generalized) (M62.81);Difficulty in walking, not elsewhere classified (R26.2);Other symptoms and signs involving the nervous system [id number])    Time: [id number] PT Time Calculation (min) (ACUTE ONLY): 41 min   Charges:        Wells Guiles B. Santhosh Gulino, PT, DPT  Acute Rehabilitation [phone]  pager [phone]) [phone] office  @ [facility]: [phone]    PT Evaluation $PT Eval Moderate Complexity: 1 Mod PT Treatments $Gait Training: 23-37 mins      11/28/2018, 4:18 PM

## 2018-11-28 NOTE — Progress Notes (Signed)
RT NOTES: VC 0.6L NIF -20 after 4 attempts.

## 2018-11-28 NOTE — Progress Notes (Signed)
RT NOTES: Went to room to perform NIF and VC with patient. Patient not in room. Will check back later.

## 2018-11-28 NOTE — Procedures (Signed)
Interventional Radiology Procedure Note  Procedure: Placement of a right IJ approach tunneled HD catheter 19cm.  Tip is positioned at the superior cavoatrial junction and catheter is ready for immediate use.  Complications: None Recommendations:  - Ok use - Do not submerge - Routine line care   Signed,  Dulcy Fanny. Earleen Newport, DO

## 2018-11-28 NOTE — Procedures (Signed)
Lumbar Diagnostic Facet Joint Nerve Block with Fluoroscopic Guidance   Patient: Erica Drake      Date of Birth: 1935/09/04 MRN: [id number] PCP: Prince Solian, MD      Visit Date: 11/04/2018   Universal Protocol:    Date/Time: 10/02/205:58 AM  Consent Given By: the patient  Position: PRONE  Additional Comments: Vital signs were monitored before and after the procedure. Patient was prepped and draped in the usual sterile fashion. The correct patient, procedure, and site was verified.   Injection Procedure Details:  Procedure Site One Meds Administered:  Meds ordered this encounter  Medications  . methylPREDNISolone acetate (DEPO-MEDROL) injection 80 mg     Laterality: Bilateral  Location/Site:  L4-L5 L5-S1  Needle size: 22 ga.  Needle type:spinal  Needle Placement: Oblique pedical  Findings:   -Comments: There was excellent flow of contrast along the articular pillars without intravascular flow.  Procedure Details: The fluoroscope beam is vertically oriented in AP and then obliqued 15 to 20 degrees to the ipsilateral side of the desired nerve to achieve the "Scotty dog" appearance.  The skin over the target area of the junction of the superior articulating process and the transverse process (sacral ala if blocking the L5 dorsal rami) was locally anesthetized with a 1 ml volume of 1% Lidocaine without Epinephrine.  The spinal needle was inserted and advanced in a trajectory view down to the target.   After contact with periosteum and negative aspirate for blood and CSF, correct placement without intravascular or epidural spread was confirmed by injecting 0.5 ml. of Isovue-250.  A spot radiograph was obtained of this image.    Next, a 0.5 ml. volume of the injectate described above was injected. The needle was then redirected to the other facet joint nerves mentioned above if needed.  Prior to the procedure, the patient was given a Pain Diary which was completed  for baseline measurements.  After the procedure, the patient rated their pain every 30 minutes and will continue rating at this frequency for a total of 5 hours.  The patient has been asked to complete the Diary and return to [place] by mail, fax or hand delivered as soon as possible.   Additional Comments:  The patient tolerated the procedure well Dressing: 2 x 2 sterile gauze and Band-Aid    Post-procedure details: Patient was observed during the procedure. Post-procedure instructions were reviewed.  Patient left the clinic in stable condition.

## 2018-11-28 NOTE — Progress Notes (Addendum)
NIf -15, VC .9 L.  Best of three attempts good patient effort. Patient requests that we not wake her during the night for NIF and VC parameters.  RT will resume in AM.

## 2018-11-28 NOTE — Progress Notes (Signed)
Subjective: Mild nausea after getting her line placed this morning.  Her notes have been borderline.  Exam: Vitals:   11/28/18 0905 11/28/18 0910  BP: (!) 154/69 (!) 169/79  Pulse: 73 70  Resp: 20 13  Temp:    SpO2: 99% 99%   Gen: In bed, NAD Resp: non-labored breathing, no acute distress Abd: soft, nt  Neuro: MS: awake, alert, interactive and appropriate.  CN:no ptosis or diploplia, able to count to 15 on a single breath.  Motor: 4-/5 neck flexion.    Impression: 83 yo F with MG with exacerbation. She is going to undergo plasmapheresis today. NIFs are bordeline, but clinically she seems better than the numbers, will continue to monitor.   Recommendations: 1)pheresis treatment 1 today.  2) PT, OT assessment 3) continue frequent NIFs 4) continue home mestinon,azathioprine  Roland Rack, MD [facility] [phone]  If 7pm- 7am, please page neurology on call as listed in [place].

## 2018-11-28 NOTE — Evaluation (Signed)
Clinical/Bedside Swallow Evaluation Patient Details  Name: Erica Drake MRN: [id number] Date of Birth: 08-11-1935  Today's Date: 11/28/2018 Time: SLP Start Time (ACUTE ONLY): 1150 SLP Stop Time (ACUTE ONLY): 1210 SLP Time Calculation (min) (ACUTE ONLY): 20 min  Past Medical History:  Past Medical History:  Diagnosis Date  . Allergic rhinitis   . Arthritis   . Breast cancer ([place])   . Chronic bronchitis    some mild wheezing at change of weather-  . Chronic fatigue   . Complication of anesthesia    pt reports she always has to be admitted after surgery with pulmon. consult  d/t respiratory history  . COPD mixed type ([place])   . Decreased hearing    intol hearing aids. Deaf left ear, hearing aid right ear.  . Diabetes mellitus without complication ([place])   . Eustachian tube dysfunction    decreased hearing  . Fibromyalgia   . GERD (gastroesophageal reflux disease)    [place]  . Hx of myasthenia gravis   . Insomnia   . Interstitial cystitis    [facility]-Dr. Karsten Ro sees periodically.(occ. self caths at home)  . Renal cyst   . Vertigo    Past Surgical History:  Past Surgical History:  Procedure Laterality Date  . BREAST BIOPSY Right 06/2017   x2  . BREAST CYST ASPIRATION    . CHOLECYSTECTOMY N/A 01/14/2015   Procedure: LAPAROSCOPIC CHOLECYSTECTOMY WITH INTRAOPERATIVE CHOLANGIOGRAM;  Surgeon: Johnathan Hausen, MD;  Location: [facility];  Service: General;  Laterality: N/A;  . IR FLUORO GUIDE CV LINE RIGHT  11/28/2018  . IR US GUIDE VASC ACCESS RIGHT  11/28/2018  . KNEE ARTHROSCOPY WITH MEDIAL MENISECTOMY Left 07/18/2016   Procedure: LEFT KNEE ARTHROSCOPY WITH MEDIAL MENISECTOMY dedribement and chrodralplasty;  Surgeon: Gaynelle Arabian, MD;  Location: [facility];  Service: Orthopedics;  Laterality: Left;  . NISSEN FUNDOPLICATION     tx of GERD   HPI:  pt is an 83 yo female admitted for myasthenia gravis exacerbation   Assessment / Plan / Recommendation Clinical Impression   Pt  presents with generalized oral motor weakness but is able to consume regular textures and thin liquids without overt s/s of aspiration.  Initiating a swallow appeared timely but slightly more effortful than usual for pt and she reports soreness and weakness in head and neck.  Pt endorses that she is typically very cautious with her PO intake given dx of myasthenia gravis.  She has never had swallowing difficulty in the past and wanted to avoid any diet modifications if possible.  At this point, I would recommend that pt resume a regular diet and thin liquids with use of universal swallowing precautions.  SLP will follow up x1 additional session to ensure toleration of diet but do not anticipate any further speech needs beyond that.    SLP Visit Diagnosis: Dysphagia, unspecified (R13.10)    Aspiration Risk  Mild aspiration risk    Diet Recommendation Regular;Thin liquid   Liquid Administration via: Cup;Straw Medication Administration: Whole meds with liquid Supervision: Patient able to self feed Compensations: Slow rate;Small sips/bites Postural Changes: Seated upright at 90 degrees;Remain upright for at least 30 minutes after po intake    Other  Recommendations Oral Care Recommendations: Oral care BID   Follow up Recommendations None      Frequency and Duration min 1 x/week          Prognosis Prognosis for Safe Diet Advancement: Good      Swallow Study   General  Date of Onset: (see HPI) HPI: pt is an 83 yo female admitted for myasthenia gravis exacerbation Type of Study: Bedside Swallow Evaluation Previous Swallow Assessment: none on record Diet Prior to this Study: NPO Temperature Spikes Noted: No Respiratory Status: Room air History of Recent Intubation: No Behavior/Cognition: Alert;Cooperative;Pleasant mood Oral Cavity Assessment: Within Functional Limits Oral Cavity - Dentition: Adequate natural dentition Vision: Functional for self-feeding Self-Feeding Abilities: Able  to feed self Patient Positioning: Upright in chair Baseline Vocal Quality: Normal Volitional Cough: Strong Volitional Swallow: Able to elicit    Oral/Motor/Sensory Function Overall Oral Motor/Sensory Function: Generalized oral weakness   Ice Chips     Thin Liquid Thin Liquid: Within functional limits    Nectar Thick     Honey Thick     Puree Puree: Within functional limits   Solid     Solid: Within functional limits      Kellye Mizner, Elmyra Ricks L 11/28/2018,12:16 PM

## 2018-11-28 NOTE — Evaluation (Signed)
Occupational Therapy Evaluation Patient Details Name: Erica Drake MRN: [id number] DOB: [birth year] Today's Date: 11/28/2018    History of Present Illness pt is an 83 yo female admitted for myasthenia gravis exacerbation PMH: breast cancer, fibromyalgia, L death, vertigo   Clinical Impression   PT admitted with MG exacerbation. Pt currently with functional limitiations due to the deficits listed below (see OT problem list). Pt required min (A) for transfer to chair hand held (A). Pt reports hx of vertigo and using gaze stabilization. Pt declines dizziness at this time.  Pt will benefit from skilled OT to increase their independence and safety with adls and balance to allow discharge [place].     Follow Up Recommendations  Home health OT    Equipment Recommendations       Recommendations for Other Services       Precautions / Restrictions Precautions Precautions: Fall Restrictions Weight Bearing Restrictions: No      Mobility Bed Mobility Overal bed mobility: Modified Independent                Transfers Overall transfer level: Needs assistance   Transfers: Sit to/from Stand Sit to Stand: Min assist              Balance Overall balance assessment: Needs assistance                                         ADL either performed or assessed with clinical judgement   ADL Overall ADL's : Needs assistance/impaired Eating/Feeding: Supervision/ safety;Sitting   Grooming: Wash/dry face;Wash/dry hands;Supervision/safety;Sitting   Upper Body Bathing: Supervision/ safety;Sitting           Lower Body Dressing: Minimal assistance Lower Body Dressing Details (indicate cue type and reason): figure 4 cross supine Toilet Transfer: Minimal assistance           Functional mobility during ADLs: Minimal assistance       Vision   Vision Assessment?: No apparent visual deficits     Perception     Praxis      Pertinent Vitals/Pain Pain  Assessment: No/denies pain     Hand Dominance Right   Extremity/Trunk Assessment Upper Extremity Assessment Upper Extremity Assessment: Overall WFL for tasks assessed   Lower Extremity Assessment Lower Extremity Assessment: Defer to PT evaluation   Cervical / Trunk Assessment Cervical / Trunk Assessment: Normal   Communication Communication Communication: No difficulties   Cognition Arousal/Alertness: Awake/alert Behavior During Therapy: WFL for tasks assessed/performed Overall Cognitive Status: Within Functional Limits for tasks assessed                                     General Comments  reports at baseline vestibular deficits and describes BPPV without any previous treatment other than medication    Exercises     Shoulder Instructions      Home Living Family/patient expects to be discharged to:: Private residence Living Arrangements: Alone;Other (Comment)(independent care at [facility])   Type of Home: Assisted living([facility] 2 bedroom)       Home Layout: One level         Bathroom Toilet: Standard         Additional Comments: housekeeping 1 x week and can provided meal and transportation  to appointments      Prior Functioning/Environment Level of Independence:  Independent        Comments: has a dog named Posey        OT Problem List: Decreased strength;Impaired balance (sitting and/or standing);Decreased coordination;Decreased activity tolerance      OT Treatment/Interventions: Self-care/ADL training;Therapeutic exercise;Energy conservation;DME and/or AE instruction;Therapeutic activities;Patient/family education;Balance training    OT Goals(Current goals can be found in the care plan section) Acute Rehab OT Goals Patient Stated Goal: to get back to dog Posey OT Goal Formulation: With patient Time For Goal Achievement: 12/12/18 Potential to Achieve Goals: Good  OT Frequency: Min 2X/week   Barriers to D/C:             Co-evaluation              AM-PAC OT "6 Clicks" Daily Activity     Outcome Measure Help from another person eating meals?: None Help from another person taking care of personal grooming?: A Little Help from another person toileting, which includes using toliet, bedpan, or urinal?: A Little Help from another person bathing (including washing, rinsing, drying)?: A Little Help from another person to put on and taking off regular upper body clothing?: A Little Help from another person to put on and taking off regular lower body clothing?: A Little 6 Click Score: 19   End of Session Equipment Utilized During Treatment: Gait belt;Rolling walker Nurse Communication: Mobility status;Precautions  Activity Tolerance: Patient tolerated treatment well Patient left: with call bell/phone within reach;in chair  OT Visit Diagnosis: Unsteadiness on feet (R26.81);Muscle weakness (generalized) (M62.81)                Time: [id number] OT Time Calculation (min): 14 min Charges:  OT General Charges $OT Visit: 1 Visit OT Evaluation $OT Eval Moderate Complexity: 1 Mod   Jeri Modena, OTR/L  Acute Rehabilitation Services Pager: [phone] Office: [phone] .   Jeri Modena 11/28/2018, 2:56 PM

## 2018-11-29 LAB — BASIC METABOLIC PANEL
Anion gap: 7 (ref 5–15)
BUN: 12 mg/dL (ref 8–23)
CO2: 24 mmol/L (ref 22–32)
Calcium: 8.8 mg/dL — ABNORMAL LOW (ref 8.9–10.3)
Chloride: 109 mmol/L (ref 98–111)
Creatinine, Ser: 0.93 mg/dL (ref 0.44–1.00)
GFR calc Af Amer: >60 mL/min (ref >60)
GFR calc non Af Amer: 58 mL/min — ABNORMAL LOW (ref >60)
Glucose, Bld: 93 mg/dL (ref 70–99)
Potassium: 4.2 mmol/L (ref 3.5–5.1)
Sodium: 140 mmol/L (ref 135–145)

## 2018-11-29 LAB — CBC WITH DIFFERENTIAL/PLATELET
Abs Immature Granulocytes: 0.03 10*3/uL (ref 0.00–0.07)
Basophils Absolute: 0 10*3/uL (ref 0.0–0.1)
Basophils Relative: 1 %
Eosinophils Absolute: 0 10*3/uL (ref 0.0–0.5)
Eosinophils Relative: 1 %
HCT: 37.9 % (ref 36.0–46.0)
Hemoglobin: 12.3 g/dL (ref 12.0–15.0)
Immature Granulocytes: 1 %
Lymphocytes Relative: 16 %
Lymphs Abs: 1 10*3/uL (ref 0.7–4.0)
MCH: 33.7 pg (ref 26.0–34.0)
MCHC: 32.5 g/dL (ref 30.0–36.0)
MCV: 103.8 fL — ABNORMAL HIGH (ref 80.0–100.0)
Monocytes Absolute: 0.7 10*3/uL (ref 0.1–1.0)
Monocytes Relative: 10 %
Neutro Abs: 4.8 10*3/uL (ref 1.7–7.7)
Neutrophils Relative %: 71 %
Platelets: 236 10*3/uL (ref 150–400)
RBC: 3.65 MIL/uL — ABNORMAL LOW (ref 3.87–5.11)
RDW: 14.2 % (ref 11.5–15.5)
WBC: 6.6 10*3/uL (ref 4.0–10.5)
nRBC: 0 % (ref 0.0–0.2)

## 2018-11-29 MED ORDER — HEPARIN SODIUM (PORCINE) 1000 UNIT/ML IJ SOLN
3.2000 mL | Freq: Once | INTRAMUSCULAR | Status: AC
  Administered 2018-11-29: 3200 [IU] via INTRAVENOUS

## 2018-11-29 MED ORDER — ACD FORMULA A 0.73-2.45-2.2 GM/100ML VI SOLN
Status: AC
  Filled 2018-11-29: qty 500

## 2018-11-29 MED ORDER — SODIUM CHLORIDE 0.9 % IV SOLN
2.0000 g | Freq: Once | INTRAVENOUS | Status: AC
  Administered 2018-11-29: 18:00:00 2 g via INTRAVENOUS
  Filled 2018-11-29: qty 20

## 2018-11-29 MED ORDER — SODIUM CHLORIDE 0.9 % IV SOLN
INTRAVENOUS | Status: AC
  Administered 2018-11-29: 18:00:00 via INTRAVENOUS_CENTRAL
  Filled 2018-11-29 (×3): qty 200

## 2018-11-29 MED ORDER — HEPARIN SODIUM (PORCINE) 1000 UNIT/ML IJ SOLN: 1000.0000 [IU] | Freq: Once | INTRAMUSCULAR | Status: DC

## 2018-11-29 MED ORDER — DIPHENHYDRAMINE HCL 25 MG PO CAPS: 25.0000 mg | ORAL_CAPSULE | Freq: Four times a day (QID) | ORAL | Status: DC | PRN

## 2018-11-29 MED ORDER — NONFORMULARY OR COMPOUNDED ITEM
200.0000 mg | Freq: Two times a day (BID) | Status: DC
  Administered 2018-11-29 – 2018-12-05 (×13): 200 mg via ORAL
  Filled 2018-11-29 (×14): qty 1

## 2018-11-29 MED ORDER — ACETAMINOPHEN 325 MG PO TABS
ORAL_TABLET | ORAL | Status: AC
  Filled 2018-11-29: qty 2

## 2018-11-29 MED ORDER — CALCIUM CARBONATE ANTACID 500 MG PO CHEW
CHEWABLE_TABLET | ORAL | Status: AC
  Filled 2018-11-29: qty 2

## 2018-11-29 MED ORDER — DIPHENHYDRAMINE HCL 25 MG PO CAPS
ORAL_CAPSULE | ORAL | Status: AC
  Filled 2018-11-29: qty 1

## 2018-11-29 MED ORDER — ACD FORMULA A 0.73-2.45-2.2 GM/100ML VI SOLN: 500.0000 mL | Status: DC

## 2018-11-29 MED ORDER — CALCIUM CARBONATE ANTACID 500 MG PO CHEW
2.0000 | CHEWABLE_TABLET | ORAL | Status: AC
  Administered 2018-11-29 (×2): 400 mg via ORAL

## 2018-11-29 MED ORDER — HEPARIN SODIUM (PORCINE) 1000 UNIT/ML IJ SOLN
INTRAMUSCULAR | Status: AC
  Filled 2018-11-29: qty 4

## 2018-11-29 MED ORDER — ACETAMINOPHEN 325 MG PO TABS: 650.0000 mg | ORAL_TABLET | ORAL | Status: DC | PRN

## 2018-11-29 NOTE — Progress Notes (Signed)
NIF--18 VC-1.2L With good patient effort

## 2018-11-29 NOTE — Progress Notes (Signed)
Subjective: Tolerated PLEX well yesterday  Exam: Vitals:   11/29/18 0814 11/29/18 1119  BP: (!) 127/44 (!) 127/52  Pulse: 66 77  Resp: 17 17  Temp: 98.3 F (36.8 C) 98.2 F (36.8 C)  SpO2: 98% 98%   Gen: In bed, NAD Resp: non-labored breathing, no acute distress Abd: soft, nt  Neuro: MS: awake, alert, interactive and appropriate.  CN:no ptosis or diploplia, able to count to 15 on a single breath.  Motor: 4/5 neck flexion.    Impression: 83 yo F with MG with exacerbation. She is going to undergo plasmapheresis today. NIFs are bordeline, but clinically she seems better than the numbers, would not rush to intubation. I would favor continuing to monitor inpatient given how low these numbers are, however.   Recommendations: 1) pheresis treatment 2 today.  2) PT, OT assessment 3) continue frequent NIFs 4) continue home mestinon,azathioprine  Roland Rack, MD [facility] [phone]  If 7pm- 7am, please page neurology on call as listed in [place].

## 2018-11-29 NOTE — Progress Notes (Signed)
RT note: Patient had a NIF of -20 and VC of 1.0 with good effort.

## 2018-11-29 NOTE — Progress Notes (Signed)
PROGRESS NOTE  Erica Drake [id number] DOB: October 22, 1935 DOA: 11/27/2018 PCP: Erica Solian, MD  Brief History   Erica Drake is a 83 y.o. female with medical history significant of myasthenia gravis, breast cancer, interstitial cystitis, fibromyalgia, presents to the emergency department due to worsening of weakness since couple of months.  Patient has myasthenia gravis and she is on IVIG every 3 weeks.  Patient reports since last few weeks she is more weak, has neck pain, double vision, low energy, drooping of face and wheezing.  As per daughter at bedside reports  jaw tightness and difficulty swallowing.  Her current regime for myasthenia gravis: IVIG every 3 weeks, azathioprine 75 mg p.o. twice daily and Mestinon 60 mg 3 times daily.  She is compliant with her medication and she took all of her home medicines this morning.  She recently evaluated by her pulmonologist and placed her on 2 L of oxygen via nasal cannulae at bedtime due to low oxygen saturation.  Due to her worsening weakness she was told by her neurologist Dr. Posey Pronto to go to the emergency department for plasma exchange.  Patient reports dysuria, lower abdominal pain, hematuria since couple of days.  She has history of chronic interstitial cystitis.  She denies fever, chills, cough, congestion, runny nose, sore throat, recent COVID-19 exposure, headache, lightheadedness, dizziness, nausea, vomiting, diarrhea, decreased appetite, weight loss.  She lives alone at independent living facility and she is independent on her daily life activities.  She denies smoking, alcohol, illicit drug use.  She is compliant with her IVIG appointments.  ED Course: Patient is afebrile, no leukocytosis, neurology consulted by EDP-recommended plasmapheresis.  Chest x-ray is negative.  COVID test negative.  The patient was admitted to a step down bed. Her NIF is being monitored by respiratory therapy. Neurology is consulted. She has had a  Rt IJ tunnelled catheter placed for immediate use. She will be receiving plasma exchange to treat her myasthenia gravis exacerbation. She is on IVIG as outpatient. She states that lately it has not been helping. She is also found to have a UTI, and is being treated with IV ancef.  Consultants   Neurology  Interventional Radiology  Procedures   Placement of tunneled Catheter in Right IJ  Plasma exchange - pending  Antibiotics   Anti-infectives (From admission, onward)   Start     Dose/Rate Route Frequency Ordered Stop   11/28/18 1900  cefTRIAXone (ROCEPHIN) 1 g in sodium chloride 0.9 % 100 mL IVPB     1 g 200 mL/hr over 30 Minutes Intravenous Every 24 hours 11/28/18 1850     11/28/18 0852  ceFAZolin (ANCEF) IVPB 1 g/50 mL premix     over 30 Minutes  Continuous PRN 11/28/18 0852 11/28/18 0842   11/28/18 0834  ceFAZolin (ANCEF) 2-4 GM/100ML-% IVPB    Note to Pharmacy: [name]   : cabinet override      11/28/18 0834 11/28/18 2044     Subjective  The patient is resting comfortably. Her speech seems more slurred.  Objective   Vitals:  Vitals:   11/29/18 0814 11/29/18 1119  BP: (!) 127/44 (!) 127/52  Pulse: 66 77  Resp: 17 17  Temp: 98.3 F (36.8 C) 98.2 F (36.8 C)  SpO2: 98% 98%   Exam:  Constitutional:   The patient is awake, alert, and oriented x 3. No acute distress. Respiratory:   No increased work of breathing.  Shallow respirations, but no tachypnea  No wheezes, rales, or  rhonchi  No tactile fremitus Cardiovascular:   Regular rate and rhythm  No murmurs, ectopy, or gallups.  No lateral PMI. No thrills. Abdomen:   Abdomen is soft, non-tender, non-distended  No hernias, masses, or organomegaly  Normoactive bowel sounds.  Musculoskeletal:   No cyanosis, clubbing, or edema Skin:   No rashes, lesions, ulcers  palpation of skin: no induration or nodules Neurologic:   CN 2-12 intact  Sensation all 4 extremities intact Psychiatric:    Mental status o Mood, affect appropriate o Orientation to person, place, time   judgment and insight appear intact  I have personally reviewed the following:   Today's Data   Vitals, Respiratory data, CBC, CMP  Scheduled Meds:  azaTHIOprine  75 mg Oral BID   pyridostigmine  60 mg Oral TID PC   Wellbutrin SR tablets  200 mg Oral BID   Continuous Infusions:  cefTRIAXone (ROCEPHIN)  IV 1 g (11/28/18 2011)   citrate dextrose      Principal Problem:   Myasthenia gravis with exacerbation (HCC) Active Problems:   COPD mixed type (HCC)   Fibromyalgia   Nocturnal hypoxemia   Anxiety   Acute lower UTI   AKI (acute kidney injury) ([place])   Myasthenia gravis in crisis ([place])   LOS: 1 day   A & P  Myasthenia gravis with exacerbation: Patient has history of seropositive myasthenia gravis and she is on IVIG every 3 weeks, azathioprine 75 mg p.o. twice daily and Mestinon 60 mg 3 times daily.  Presented with worsening of weakness. She states that she doesn't think that the IVIG has been helping for some time. Neurology has been consulted. She has had a Right IJ tunneled catheter placed for plasma exchange which is to begin today. She has been getting NIF and VC checks q shift, but only one has been done. It demonstrated a poor NIF at (-20) and VC of .6L. Oxygen saturations have remained stable at 97%. The patient is not on high dose steroids due to a stated allergy resulting in shortness of breath and tongue swelling. I have discussed the patient in detail with Dr. Ruthann Cancer, who stated that she would put her on her list. I will also discuss the patient with the hospitalist team coming on overnight. I have asked that nursing request a stat NIF and VC on the patient now. I have also ordered a stat ABG. Dr. Ruthann Cancer feels that the patient likely only requires BIPAP. Repeat NIF and VC demonstrated NIF of -15, but VC of 0.9. Pt is maintaining her oxygen saturations. Altogether the patient appears  stable. Continue to monitor respiratory status carefully. I appreciate PCCM help.  Urinary tract infection: Patietn ihas been started on ceftriaxone. Patient is afebrile, no leukocytosis. UA is positive for leukocytes, patient is complaining of dysuria, hematuria, lower abdominal pain. Urine culture is pending.  AKI on CKD: Resolved. Avoid nephrotoxic medication and hypotension. Monitor creatinine, electrolytes, and volume status.  COPD: Not in acute exacerbation. Patient is maintaining oxygen saturation on room air. On continuous pulse ox. We will continue home inhalers.  Anxiety: Stable. Will avoid use of anxiolytics at this time due to low NIF and VC. Continue  Wellbutrin.  I have seen and examined this patient myself. I have spent 34 minutes in her evaluation and care.  DVT prophylaxis: TED/SCD Code Status: Full code Family Communication: None available. Disposition Plan: To be determined   Janna Oak, DO [facility] Direct contact: see [email or web address]  7PM-7AM contact night coverage as  above 11/29/2018, 2:21 PM  LOS: 0 days

## 2018-11-29 NOTE — Progress Notes (Signed)
Pt home med taken to pharmacy: Wellbutin and Klonopin. Pharmacy to dispense home med (wellbutin).

## 2018-11-29 NOTE — Progress Notes (Signed)
NIF -15 VC 1.1L Patient gave good effort  Ashley Mariner RRT

## 2018-11-29 NOTE — Progress Notes (Signed)
Patient had NIF  -15 VC 1.0 L with good effort  Ashley Mariner RRT

## 2018-11-30 LAB — BASIC METABOLIC PANEL
Anion gap: 6 (ref 5–15)
BUN: 12 mg/dL (ref 8–23)
CO2: 24 mmol/L (ref 22–32)
Calcium: 8.6 mg/dL — ABNORMAL LOW (ref 8.9–10.3)
Chloride: 110 mmol/L (ref 98–111)
Creatinine, Ser: 1.11 mg/dL — ABNORMAL HIGH (ref 0.44–1.00)
GFR calc Af Amer: 54 mL/min — ABNORMAL LOW (ref >60)
GFR calc non Af Amer: 47 mL/min — ABNORMAL LOW (ref >60)
Glucose, Bld: 152 mg/dL — ABNORMAL HIGH (ref 70–99)
Potassium: 4.3 mmol/L (ref 3.5–5.1)
Sodium: 140 mmol/L (ref 135–145)

## 2018-11-30 LAB — POCT I-STAT, CHEM 8
BUN: 16 mg/dL (ref 8–23)
Calcium, Ion: 1.23 mmol/L (ref 1.15–1.40)
Chloride: 103 mmol/L (ref 98–111)
Creatinine, Ser: 1 mg/dL (ref 0.44–1.00)
Glucose, Bld: 99 mg/dL (ref 70–99)
HCT: 42 % (ref 36.0–46.0)
Hemoglobin: 14.3 g/dL (ref 12.0–15.0)
Potassium: 3.8 mmol/L (ref 3.5–5.1)
Sodium: 140 mmol/L (ref 135–145)
TCO2: 26 mmol/L (ref 22–32)

## 2018-11-30 LAB — CBC WITH DIFFERENTIAL/PLATELET
Abs Immature Granulocytes: 0.03 10*3/uL (ref 0.00–0.07)
Basophils Absolute: 0 10*3/uL (ref 0.0–0.1)
Basophils Relative: 0 %
Eosinophils Absolute: 0.1 10*3/uL (ref 0.0–0.5)
Eosinophils Relative: 1 %
HCT: 36.9 % (ref 36.0–46.0)
Hemoglobin: 12.4 g/dL (ref 12.0–15.0)
Immature Granulocytes: 0 %
Lymphocytes Relative: 16 %
Lymphs Abs: 1.3 10*3/uL (ref 0.7–4.0)
MCH: 34.3 pg — ABNORMAL HIGH (ref 26.0–34.0)
MCHC: 33.6 g/dL (ref 30.0–36.0)
MCV: 101.9 fL — ABNORMAL HIGH (ref 80.0–100.0)
Monocytes Absolute: 0.7 10*3/uL (ref 0.1–1.0)
Monocytes Relative: 9 %
Neutro Abs: 5.5 10*3/uL (ref 1.7–7.7)
Neutrophils Relative %: 74 %
Platelets: 227 10*3/uL (ref 150–400)
RBC: 3.62 MIL/uL — ABNORMAL LOW (ref 3.87–5.11)
RDW: 13.9 % (ref 11.5–15.5)
WBC: 7.6 10*3/uL (ref 4.0–10.5)
nRBC: 0 % (ref 0.0–0.2)

## 2018-11-30 NOTE — Progress Notes (Signed)
Subjective: She feels that she is starting to see some improvement  Exam: Vitals:   11/30/18 0451 11/30/18 0737  BP: (!) 111/39 (!) 98/54  Pulse: 78 74  Resp: 18 20  Temp: 97.9 F (36.6 C) 98.2 F (36.8 C)  SpO2: 97% 94%   Gen: In bed, NAD Resp: non-labored breathing, no acute distress Abd: soft, nt  Neuro: MS: awake, alert, interactive and appropriate.  CN:no ptosis or diploplia, able to count to 23 on a single breath.  No transverse smile, palate elevates well Motor: 4+/5 neck flexion, 5/5 throughout   Impression: 83 yo F with MG with exacerbation. She has had 2 treatments of plasma exchange thus far.  NIFs have been bordeline, but clinically she seems better than the numbers, and with a change in mouthpiece today, she is pulling as high as the manometer would read.   She is scheduled for plasma exchange again tomorrow, and I would favor holding her for this treatment, but following this she could complete her last 2 treatments on Wednesday and Friday as an outpatient.  Recommendations: 1) pheresis treatment tomorrow. 2) PT, OT assessment 3) continue frequent NIFs 4) continue home mestinon,azathioprine  Roland Rack, MD [facility] [phone]  If 7pm- 7am, please page neurology on call as listed in [facility].

## 2018-11-30 NOTE — Progress Notes (Signed)
NIF -25 VC  1.2L   Patient gave better effort this morning

## 2018-11-30 NOTE — Progress Notes (Signed)
NIF greater than -40 and VC 1.2L With great pt effort

## 2018-11-30 NOTE — Progress Notes (Signed)
Attempted maneuvers again with a different mouthpiece. Numbers much improved.  NIF greater than -40 as -40 was as far as the manometer would read. VC 1.4 L

## 2018-11-30 NOTE — Progress Notes (Signed)
NIF again greater than -40 VC 1.4L Patient gave good effort  Ashley Mariner RRT

## 2018-11-30 NOTE — Progress Notes (Signed)
PROGRESS NOTE  [name] [id number] DOB: 06/05/1935 DOA: 11/27/2018 PCP: Erica Solian, MD  Brief History   Erica Drake is a 83 y.o. female with medical history significant of myasthenia gravis, breast cancer, interstitial cystitis, fibromyalgia, presents to the emergency department due to worsening of weakness since couple of months.  Patient has myasthenia gravis and she is on IVIG every 3 weeks.  Patient reports since last few weeks she is more weak, has neck pain, double vision, low energy, drooping of face and wheezing.  As per daughter at bedside reports  jaw tightness and difficulty swallowing.  Her current regime for myasthenia gravis: IVIG every 3 weeks, azathioprine 75 mg p.o. twice daily and Mestinon 60 mg 3 times daily.  She is compliant with her medication and she took all of her home medicines this morning.  She recently evaluated by her pulmonologist and placed her on 2 L of oxygen via nasal cannulae at bedtime due to low oxygen saturation.  Due to her worsening weakness she was told by her neurologist Dr. Posey Pronto to go to the emergency department for plasma exchange.  Patient reports dysuria, lower abdominal pain, hematuria since couple of days.  She has history of chronic interstitial cystitis.  She denies fever, chills, cough, congestion, runny nose, sore throat, recent COVID-19 exposure, headache, lightheadedness, dizziness, nausea, vomiting, diarrhea, decreased appetite, weight loss.  She lives alone at independent living facility and she is independent on her daily life activities.  She denies smoking, alcohol, illicit drug use.  She is compliant with her IVIG appointments.  ED Course: Patient is afebrile, no leukocytosis, neurology consulted by EDP-recommended plasmapheresis.  Chest x-ray is negative.  COVID test negative.  The patient was admitted to a step down bed. Her NIF is being monitored by respiratory therapy. Neurology is consulted. She has had a  Rt IJ tunnelled catheter placed for immediate use. She will be receiving plasma exchange to treat her myasthenia gravis exacerbation. She is on IVIG as outpatient. She states that lately it has not been helping. She is also found to have a UTI, and is being treated with IV ancef.  Consultants   Neurology  Interventional Radiology  Procedures   Placement of tunneled Catheter in Right IJ  Plasma exchange - pending  Antibiotics   Anti-infectives (From admission, onward)   Start     Dose/Rate Route Frequency Ordered Stop   11/28/18 1900  cefTRIAXone (ROCEPHIN) 1 g in sodium chloride 0.9 % 100 mL IVPB     1 g 200 mL/hr over 30 Minutes Intravenous Every 24 hours 11/28/18 1850     11/28/18 0852  ceFAZolin (ANCEF) IVPB 1 g/50 mL premix     over 30 Minutes  Continuous PRN 11/28/18 0852 11/28/18 0842   11/28/18 0834  ceFAZolin (ANCEF) 2-4 GM/100ML-% IVPB    Note to Pharmacy: [name]   : cabinet override      11/28/18 0834 11/28/18 2044     Subjective  The patient is resting comfortably. Her speech is more clear. She seems more energetic.  Objective   Vitals:  Vitals:   11/30/18 0737 11/30/18 1236  BP: (!) 98/54 (!) 144/56  Pulse: 74 92  Resp: 20 20  Temp: 98.2 F (36.8 C) (!) 97.5 F (36.4 C)  SpO2: 94%    Exam:  Constitutional:   The patient is awake, alert, and oriented x 3. No acute distress. Respiratory:   No increased work of breathing.  No wheezes, rales, or rhonchi  No tactile fremitus Cardiovascular:   Regular rate and rhythm  No murmurs, ectopy, or gallups.  No lateral PMI. No thrills. Abdomen:   Abdomen is soft, non-tender, non-distended  No hernias, masses, or organomegaly  Normoactive bowel sounds.  Musculoskeletal:   No cyanosis, clubbing, or edema Skin:   No rashes, lesions, ulcers  palpation of skin: no induration or nodules Neurologic:   CN 2-12 intact  Sensation all 4 extremities intact Psychiatric:   Mental  status o Mood, affect appropriate o Orientation to person, place, time   judgment and insight appear intact  I have personally reviewed the following:   Today's Data   Vitals, Respiratory data, CBC, CMP  Scheduled Meds:  azaTHIOprine  75 mg Oral BID   pyridostigmine  60 mg Oral TID PC   Wellbutrin SR tablets  200 mg Oral BID   Continuous Infusions:  cefTRIAXone (ROCEPHIN)  IV 1 g (11/29/18 2021)    Principal Problem:   Myasthenia gravis with exacerbation (HCC) Active Problems:   COPD mixed type (HCC)   Fibromyalgia   Nocturnal hypoxemia   Anxiety   Acute lower UTI   AKI (acute kidney injury) ([place])   Myasthenia gravis in crisis ([place])   LOS: 2 days   A & P  Myasthenia gravis with exacerbation: Patient has history of seropositive myasthenia gravis and she is on IVIG every 3 weeks, azathioprine 75 mg p.o. twice daily and Mestinon 60 mg 3 times daily.  Presented with worsening of weakness. She states that she doesn't think that the IVIG has been helping for some time. Neurology has been consulted. She has had a Right IJ tunneled catheter placed for plasma exchange which is to begin today. She has been getting NIF and VC checks q shift, but only one has been done. It demonstrated a poor NIF at (-20) and VC of .6L. Oxygen saturations have remained stable at 97%. The patient is not on high dose steroids due to a stated allergy resulting in shortness of breath and tongue swelling. I have discussed the patient in detail with Dr. Ruthann Cancer, who stated that she would put her on her list. I will also discuss the patient with the hospitalist team coming on overnight. I have asked that nursing request a stat NIF and VC on the patient now. I have also ordered a stat ABG. Dr. Ruthann Cancer feels that the patient likely only requires BIPAP. In the last 24 hours the patient's respiratory parameters have improved. NIF this morning was -21 and VC was 1.2L. She has maintained her oxygen saturations  well.  Urinary tract infection: Patietn ihas been started on ceftriaxone. Patient is afebrile, no leukocytosis. UA is positive for leukocytes, patient is complaining of dysuria, hematuria, lower abdominal pain. Urine culture is pending.  AKI on CKD: Resolved. Avoid nephrotoxic medication and hypotension. Monitor creatinine, electrolytes, and volume status.  COPD: Not in acute exacerbation. Patient is maintaining oxygen saturation on room air. On continuous pulse ox. We will continue home inhalers.  Anxiety: Stable. Will avoid use of anxiolytics at this time due to low NIF and VC. Continue  Wellbutrin.  I have seen and examined this patient myself. I have spent 32 minutes in her evaluation and care.  DVT prophylaxis: TED/SCD Code Status: Full code Family Communication: None available. Disposition Plan: To be determined   Albaro Deviney, DO [facility] Direct contact: see [email or web address]  7PM-7AM contact night coverage as above 11/30/2018, 16:08 PM  LOS: 0 days

## 2018-12-01 LAB — COMPREHENSIVE METABOLIC PANEL
ALT: 5 U/L (ref 0–44)
AST: 16 U/L (ref 15–41)
Albumin: 3.7 g/dL (ref 3.5–5.0)
Alkaline Phosphatase: 24 U/L — ABNORMAL LOW (ref 38–126)
Anion gap: 6 (ref 5–15)
BUN: 12 mg/dL (ref 8–23)
CO2: 27 mmol/L (ref 22–32)
Calcium: 8.6 mg/dL — ABNORMAL LOW (ref 8.9–10.3)
Chloride: 109 mmol/L (ref 98–111)
Creatinine, Ser: 1.06 mg/dL — ABNORMAL HIGH (ref 0.44–1.00)
GFR calc Af Amer: 57 mL/min — ABNORMAL LOW (ref >60)
GFR calc non Af Amer: 50 mL/min — ABNORMAL LOW (ref >60)
Glucose, Bld: 100 mg/dL — ABNORMAL HIGH (ref 70–99)
Potassium: 3.8 mmol/L (ref 3.5–5.1)
Sodium: 142 mmol/L (ref 135–145)
Total Bilirubin: 0.3 mg/dL (ref 0.3–1.2)
Total Protein: 5 g/dL — ABNORMAL LOW (ref 6.5–8.1)

## 2018-12-01 LAB — BASIC METABOLIC PANEL
Anion gap: 9 (ref 5–15)
BUN: 9 mg/dL (ref 8–23)
CO2: 25 mmol/L (ref 22–32)
Calcium: 8.8 mg/dL — ABNORMAL LOW (ref 8.9–10.3)
Chloride: 107 mmol/L (ref 98–111)
Creatinine, Ser: 0.87 mg/dL (ref 0.44–1.00)
GFR calc Af Amer: >60 mL/min (ref >60)
GFR calc non Af Amer: >60 mL/min (ref >60)
Glucose, Bld: 140 mg/dL — ABNORMAL HIGH (ref 70–99)
Potassium: 3.5 mmol/L (ref 3.5–5.1)
Sodium: 141 mmol/L (ref 135–145)

## 2018-12-01 LAB — CBC WITH DIFFERENTIAL/PLATELET
Abs Immature Granulocytes: 0.02 10*3/uL (ref 0.00–0.07)
Basophils Absolute: 0 10*3/uL (ref 0.0–0.1)
Basophils Relative: 0 %
Eosinophils Absolute: 0.1 10*3/uL (ref 0.0–0.5)
Eosinophils Relative: 1 %
HCT: 36.3 % (ref 36.0–46.0)
Hemoglobin: 11.8 g/dL — ABNORMAL LOW (ref 12.0–15.0)
Immature Granulocytes: 0 %
Lymphocytes Relative: 18 %
Lymphs Abs: 1.3 10*3/uL (ref 0.7–4.0)
MCH: 33.1 pg (ref 26.0–34.0)
MCHC: 32.5 g/dL (ref 30.0–36.0)
MCV: 102 fL — ABNORMAL HIGH (ref 80.0–100.0)
Monocytes Absolute: 0.7 10*3/uL (ref 0.1–1.0)
Monocytes Relative: 10 %
Neutro Abs: 5.3 10*3/uL (ref 1.7–7.7)
Neutrophils Relative %: 71 %
Platelets: 216 10*3/uL (ref 150–400)
RBC: 3.56 MIL/uL — ABNORMAL LOW (ref 3.87–5.11)
RDW: 14 % (ref 11.5–15.5)
WBC: 7.4 10*3/uL (ref 4.0–10.5)
nRBC: 0 % (ref 0.0–0.2)

## 2018-12-01 MED ORDER — HEPARIN SODIUM (PORCINE) 1000 UNIT/ML IJ SOLN
INTRAMUSCULAR | Status: AC
  Filled 2018-12-01: qty 4

## 2018-12-01 MED ORDER — DIPHENHYDRAMINE HCL 25 MG PO CAPS
25.0000 mg | ORAL_CAPSULE | Freq: Four times a day (QID) | ORAL | Status: DC | PRN
  Administered 2018-12-01 – 2018-12-03 (×2): 25 mg via ORAL

## 2018-12-01 MED ORDER — ACETAMINOPHEN 325 MG PO TABS: 650.0000 mg | ORAL_TABLET | ORAL | Status: DC | PRN

## 2018-12-01 MED ORDER — CALCIUM CARBONATE ANTACID 500 MG PO CHEW
2.0000 | CHEWABLE_TABLET | ORAL | Status: AC
  Administered 2018-12-01 (×2): 400 mg via ORAL
  Filled 2018-12-01: qty 2

## 2018-12-01 MED ORDER — DIPHENHYDRAMINE HCL 25 MG PO CAPS
ORAL_CAPSULE | ORAL | Status: AC
  Filled 2018-12-01: qty 1

## 2018-12-01 MED ORDER — ACETAMINOPHEN 325 MG PO TABS
ORAL_TABLET | ORAL | Status: AC
  Filled 2018-12-01: qty 2

## 2018-12-01 MED ORDER — CALCIUM CARBONATE ANTACID 500 MG PO CHEW
CHEWABLE_TABLET | ORAL | Status: AC
  Filled 2018-12-01: qty 2

## 2018-12-01 MED ORDER — HEPARIN SODIUM (PORCINE) 1000 UNIT/ML IJ SOLN
1000.0000 [IU] | Freq: Once | INTRAMUSCULAR | Status: AC
  Administered 2018-12-01: 1000 [IU]

## 2018-12-01 MED ORDER — LOPERAMIDE HCL 2 MG PO CAPS
2.0000 mg | ORAL_CAPSULE | ORAL | Status: DC | PRN
  Administered 2018-12-03: 2 mg via ORAL
  Filled 2018-12-01: qty 1

## 2018-12-01 MED ORDER — ACD FORMULA A 0.73-2.45-2.2 GM/100ML VI SOLN
500.0000 mL | Status: DC
  Administered 2018-12-01: 500 mL via INTRAVENOUS
  Filled 2018-12-01: qty 500

## 2018-12-01 MED ORDER — LOPERAMIDE HCL 2 MG PO CAPS
4.0000 mg | ORAL_CAPSULE | Freq: Once | ORAL | Status: AC
  Administered 2018-12-01: 4 mg via ORAL
  Filled 2018-12-01: qty 2

## 2018-12-01 MED ORDER — SODIUM CHLORIDE 0.9 % IV SOLN
2.0000 g | Freq: Once | INTRAVENOUS | Status: AC
  Administered 2018-12-01: 2 g via INTRAVENOUS
  Filled 2018-12-01: qty 20

## 2018-12-01 MED ORDER — OXYBUTYNIN CHLORIDE 5 MG PO TABS
10.0000 mg | ORAL_TABLET | Freq: Three times a day (TID) | ORAL | Status: DC
  Administered 2018-12-01 – 2018-12-02 (×3): 10 mg via ORAL
  Filled 2018-12-01 (×3): qty 2

## 2018-12-01 MED ORDER — SODIUM CHLORIDE 0.9 % IV SOLN
INTRAVENOUS | Status: AC
  Administered 2018-12-01 (×3): via INTRAVENOUS_CENTRAL
  Filled 2018-12-01 (×3): qty 200

## 2018-12-01 MED ORDER — ORAL CARE MOUTH RINSE
15.0000 mL | Freq: Two times a day (BID) | OROMUCOSAL | Status: DC
  Administered 2018-12-01 – 2018-12-05 (×9): 15 mL via OROMUCOSAL

## 2018-12-01 MED ORDER — ACD FORMULA A 0.73-2.45-2.2 GM/100ML VI SOLN
Status: AC
  Filled 2018-12-01: qty 500

## 2018-12-01 NOTE — Progress Notes (Signed)
Occupational Therapy Treatment Patient Details Name: Erica Drake MRN: [id number] DOB: [birth year] Today's Date: 12/01/2018    History of present illness pt is an 83 yo female admitted for myasthenia gravis exacerbation and in hospital for IV plasmaphoresis. PMH: breast cancer, fibromyalgia, L death, vertigo   OT comments  Pt continues to progress towards OT goals. Pt presenting with decreased strength, balance, and activity tolerance. Provided handout and education on energy conservation strategies; pt verbalized understanding. Pt performed toileting, grooming, and UB dressing with supervision and VCs for using energy conservation strategies. Continue to recommend dc with HHOT. Will follow acutely as admitted.   Follow Up Recommendations  Home health OT    Equipment Recommendations  None recommended by OT    Recommendations for Other Services PT consult    Precautions / Restrictions Precautions Precautions: Fall Restrictions Weight Bearing Restrictions: No       Mobility Bed Mobility Overal bed mobility: Modified Independent             General bed mobility comments: pt sitting EOB upon arrival. pt returned to bed with mod I at end of session  Transfers Overall transfer level: Needs assistance   Transfers: Sit to/from Stand Sit to Stand: Supervision         General transfer comment: supervision for safety    Balance Overall balance assessment: Needs assistance Sitting-balance support: No upper extremity supported;Feet supported Sitting balance-Leahy Scale: Good Sitting balance - Comments: supervision EOB   Standing balance support: During functional activity;No upper extremity supported Standing balance-Leahy Scale: Fair Standing balance comment: able to maintain static standing for short period of time during grooming at sink                           ADL either performed or assessed with clinical judgement   ADL Overall ADL's : Needs  assistance/impaired     Grooming: Wash/dry face;Wash/dry hands;Oral care;Supervision/safety;Sitting;Standing Grooming Details (indicate cue type and reason): Performed oral care and hand hygiene while standing, washed her face while sitting. required supervision for safety         Upper Body Dressing : Supervision/safety;Sitting       Toilet Transfer: Supervision/safety;Ambulation;Regular Glass blower/designer Details (indicate cue type and reason): supervision for safety and balance Toileting- Clothing Manipulation and Hygiene: Supervision/safety;Sit to/from stand Toileting - Clothing Manipulation Details (indicate cue type and reason): supervision for safety and balance     Functional mobility during ADLs: Supervision/safety General ADL Comments: required supervision for safety and balance     Vision   Vision Assessment?: No apparent visual deficits   Perception     Praxis      Cognition Arousal/Alertness: Awake/alert Behavior During Therapy: WFL for tasks assessed/performed Overall Cognitive Status: Within Functional Limits for tasks assessed                                          Exercises Exercises: Other exercises Other Exercises Other Exercises: Provided and reviewed handout on energy conservation strategies. pt verbalized understanding   Shoulder Instructions       General Comments      Pertinent Vitals/ Pain          Home Living  Prior Functioning/Environment              Frequency  Min 2X/week        Progress Toward Goals  OT Goals(current goals can now be found in the care plan section)     Acute Rehab OT Goals Patient Stated Goal: to get back to dog Posey OT Goal Formulation: With patient Time For Goal Achievement: 12/12/18 Potential to Achieve Goals: Good ADL Goals Additional ADL Goal #1: Pt will complete full adl mod I using 2 energy conservation  techniques  Plan Discharge plan remains appropriate    Co-evaluation                 AM-PAC OT "6 Clicks" Daily Activity     Outcome Measure   Help from another person eating meals?: None Help from another person taking care of personal grooming?: None Help from another person toileting, which includes using toliet, bedpan, or urinal?: None Help from another person bathing (including washing, rinsing, drying)?: None Help from another person to put on and taking off regular upper body clothing?: None Help from another person to put on and taking off regular lower body clothing?: None 6 Click Score: 24    End of Session    OT Visit Diagnosis: Unsteadiness on feet (R26.81);Muscle weakness (generalized) (M62.81)   Activity Tolerance Patient tolerated treatment well   Patient Left in bed;with call bell/phone within reach;with bed alarm set   Nurse Communication Mobility status        Time: [id number] OT Time Calculation (min): 28 min  Charges:    Gus Rankin, OT Student  Gus Rankin 12/01/2018, 2:55 PM

## 2018-12-01 NOTE — Progress Notes (Signed)
NIF -35  VC: 1.3L will good pt effort.

## 2018-12-01 NOTE — Progress Notes (Signed)
PROGRESS NOTE  Erica Drake [id number] DOB: Sep 01, 1935 DOA: 11/27/2018 PCP: Prince Solian, MD  Brief History   Erica Drake is a 83 y.o. female with medical history significant of myasthenia gravis, breast cancer, interstitial cystitis, fibromyalgia, presents to the emergency department due to worsening of weakness since couple of months.  Patient has myasthenia gravis and she is on IVIG every 3 weeks.  Patient reports since last few weeks she is more weak, has neck pain, double vision, low energy, drooping of face and wheezing.  As per daughter at bedside reports  jaw tightness and difficulty swallowing.  Her current regime for myasthenia gravis: IVIG every 3 weeks, azathioprine 75 mg p.o. twice daily and Mestinon 60 mg 3 times daily.  She is compliant with her medication and she took all of her home medicines this morning.  She recently evaluated by her pulmonologist and placed her on 2 L of oxygen via nasal cannulae at bedtime due to low oxygen saturation.  Due to her worsening weakness she was told by her neurologist Dr. Posey Pronto to go to the emergency department for plasma exchange.  Patient reports dysuria, lower abdominal pain, hematuria since couple of days.  She has history of chronic interstitial cystitis.  She denies fever, chills, cough, congestion, runny nose, sore throat, recent COVID-19 exposure, headache, lightheadedness, dizziness, nausea, vomiting, diarrhea, decreased appetite, weight loss.  She lives alone at independent living facility and she is independent on her daily life activities.  She denies smoking, alcohol, illicit drug use.  She is compliant with her IVIG appointments.  ED Course: Patient is afebrile, no leukocytosis, neurology consulted by EDP-recommended plasmapheresis.  Chest x-ray is negative.  COVID test negative.  The patient was admitted to a step down bed. Her NIF is being monitored by respiratory therapy. Neurology is consulted. She has had a  Rt IJ tunnelled catheter placed for immediate use. She will be receiving plasma exchange to treat her myasthenia gravis exacerbation. She is on IVIG as outpatient. She states that lately it has not been helping. She is also found to have a UTI, and is being treated with IV ancef.  Consultants   Neurology  Interventional Radiology  Procedures   Placement of tunneled Catheter in Right IJ  Plasma exchange - pending  Antibiotics   Anti-infectives (From admission, onward)   Start     Dose/Rate Route Frequency Ordered Stop   11/28/18 1900  cefTRIAXone (ROCEPHIN) 1 g in sodium chloride 0.9 % 100 mL IVPB     1 g 200 mL/hr over 30 Minutes Intravenous Every 24 hours 11/28/18 1850     11/28/18 0852  ceFAZolin (ANCEF) IVPB 1 g/50 mL premix     over 30 Minutes  Continuous PRN 11/28/18 0852 11/28/18 0842   11/28/18 0834  ceFAZolin (ANCEF) 2-4 GM/100ML-% IVPB    Note to Pharmacy: [name]   : cabinet override      11/28/18 0834 11/28/18 2044     Subjective  The patient is resting comfortably. Her speech is more clear. She seems more energetic. She is complaining of diarrhea after returning from plasma exchange per nursing.  Objective   Vitals:  Vitals:   12/01/18 1250 12/01/18 1310  BP: (!) 144/49 140/60  Pulse: 80 82  Resp: 16 16  Temp: 98 F (36.7 C) 98.1 F (36.7 C)  SpO2:     Exam:  Constitutional:   The patient is awake, alert, and oriented x 3. No acute distress. Respiratory:   No  increased work of breathing.  No wheezes, rales, or rhonchi  No tactile fremitus Cardiovascular:   Regular rate and rhythm  No murmurs, ectopy, or gallups.  No lateral PMI. No thrills. Abdomen:   Abdomen is soft, non-tender, non-distended  No hernias, masses, or organomegaly  Normoactive bowel sounds.  Musculoskeletal:   No cyanosis, clubbing, or edema Skin:   No rashes, lesions, ulcers  palpation of skin: no induration or nodules Neurologic:   CN 2-12  intact  Sensation all 4 extremities intact Psychiatric:   Mental status o Mood, affect appropriate o Orientation to person, place, time   judgment and insight appear intact  I have personally reviewed the following:   Today's Data   Vitals, Respiratory data, CBC, CMP  Scheduled Meds:  acetaminophen       azaTHIOprine  75 mg Oral BID   heparin       mouth rinse  15 mL Mouth Rinse BID   oxybutynin  10 mg Oral TID   pyridostigmine  60 mg Oral TID PC   Wellbutrin SR tablets  200 mg Oral BID   Continuous Infusions:  cefTRIAXone (ROCEPHIN)  IV 1 g (11/30/18 1902)   citrate dextrose      Principal Problem:   Myasthenia gravis with exacerbation (HCC) Active Problems:   COPD mixed type (HCC)   Fibromyalgia   Nocturnal hypoxemia   Anxiety   Acute lower UTI   AKI (acute kidney injury) ([place])   Myasthenia gravis in crisis ([place])   LOS: 3 days   A & P  Myasthenia gravis with exacerbation: Patient has history of seropositive myasthenia gravis and she is on IVIG every 3 weeks, azathioprine 75 mg p.o. twice daily and Mestinon 60 mg 3 times daily.  Presented with worsening of weakness. She states that she doesn't think that the IVIG has been helping for some time. Neurology has been consulted. She has had a Right IJ tunneled catheter placed for plasma exchange which is to begin today. She has been getting NIF and VC checks q shift, but only one has been done. It demonstrated a poor NIF at (-20) and VC of .6L. Oxygen saturations have remained stable at 97%. The patient is not on high dose steroids due to a stated allergy resulting in shortness of breath and tongue swelling. I have discussed the patient in detail with Dr. Ruthann Cancer, who stated that she would put her on her list. I will also discuss the patient with the hospitalist team coming on overnight. I have asked that nursing request a stat NIF and VC on the patient now. I have also ordered a stat ABG. Dr. Ruthann Cancer feels that  the patient likely only requires BIPAP. In the last 24 hours the patient's respiratory parameters have improved. NIF this morning was -21 and VC was 1.2L. She has maintained her oxygen saturations well. She will remain inpatient to complete her course of plasma exchange.  Urinary tract infection: Patietn ihas been started on ceftriaxone. Patient is afebrile, no leukocytosis. UA is positive for leukocytes, patient is complaining of dysuria, hematuria, lower abdominal pain. Urine culture is pending.  Diarrhea: Low suspicion for infectious cause. Imodium given.   AKI on CKD: Resolved. Avoid nephrotoxic medication and hypotension. Monitor creatinine, electrolytes, and volume status.  COPD: Not in acute exacerbation. Patient is maintaining oxygen saturation on room air. On continuous pulse ox. We will continue home inhalers.  Anxiety: Stable. Will avoid use of anxiolytics at this time due to low NIF and VC.  Continue  Wellbutrin.  I have seen and examined this patient myself. I have spent 35 minutes in her evaluation and care.  DVT prophylaxis: TED/SCD Code Status: Full code Family Communication: None available. Disposition Plan: To be determined   Keyshawn Hellwig, DO [facility] Direct contact: see [email or web address]  7PM-7AM contact night coverage as above 12/01/2018, 16:08 PM  LOS: 0 days

## 2018-12-01 NOTE — Progress Notes (Signed)
PT Cancellation Note  Patient Details Name: JAMESE COLELLA MRN: [id number] DOB: June 10, 1935   Cancelled Treatment:    Reason Eval/Treat Not Completed: Fatigue/lethargy limiting ability to participate; patient back from plasma exchange and reports fatigue/diahrrea.  Will attempt again another day if not d/c.    Reginia Naas 12/01/2018, 2:47 PM  Magda Kiel, [place] [phone] 12/01/2018

## 2018-12-01 NOTE — Progress Notes (Signed)
NIF-40 VC-1.2L  RT will continue to monitor.

## 2018-12-01 NOTE — Progress Notes (Signed)
PT Cancellation Note  Patient Details Name: Erica Drake MRN: [id number] DOB: 29-Jun-1935   Cancelled Treatment:    Reason Eval/Treat Not Completed: Patient at procedure or test/unavailable; patient going down for procedure, will attempt later as time permits.    Reginia Naas 12/01/2018, 11:39 AM Magda Kiel, [place] [phone] 12/01/2018

## 2018-12-02 ENCOUNTER — Telehealth: Payer: Self-pay | Admitting: Neurology

## 2018-12-02 LAB — NOVEL CORONAVIRUS, NAA (HOSP ORDER, SEND-OUT TO REF LAB; TAT 18-24 HRS): SARS-CoV-2, NAA: NOT DETECTED

## 2018-12-02 MED ORDER — CLONAZEPAM 1 MG PO TABS
1.0000 mg | ORAL_TABLET | Freq: Two times a day (BID) | ORAL | Status: DC
  Administered 2018-12-02 – 2018-12-05 (×6): 1 mg via ORAL
  Filled 2018-12-02 (×6): qty 1

## 2018-12-02 MED ORDER — TRIMETHOPRIM 100 MG PO TABS
100.0000 mg | ORAL_TABLET | Freq: Every day | ORAL | Status: DC
  Administered 2018-12-02 – 2018-12-05 (×4): 100 mg via ORAL
  Filled 2018-12-02 (×4): qty 1

## 2018-12-02 MED ORDER — PHENAZOPYRIDINE HCL 200 MG PO TABS
200.0000 mg | ORAL_TABLET | Freq: Three times a day (TID) | ORAL | Status: DC
  Administered 2018-12-02 – 2018-12-03 (×2): 200 mg via ORAL
  Filled 2018-12-02 (×4): qty 1

## 2018-12-02 NOTE — Progress Notes (Addendum)
  Speech Language Pathology Treatment: Dysphagia  Patient Details Name: Erica Drake MRN: [id number] DOB: 1935-10-27 Today's Date: 12/02/2018 Time: [id number] SLP Time Calculation (min) (ACUTE ONLY): 14 min  Assessment / Plan / Recommendation Clinical Impression  Pt was visited soon after eating lunch, presented in a reclined position in bed. discussed hx of GERD and sitting up after eating meals to reduce reflux, pt responded that she has not recently experienced reflux after eating and that her GERD is well-managed. Self-reported no current difficulties eating with implemented use of compensatory strategies of slow rate and small bites. Skilled observation with regular consistency washed with thin with no indications of oral phase impairments or s/sx of aspiration. Recommend discharge from SLP dysphagia intervention.    HPI HPI: Erica Drake is an 83 yo female admitted for myasthenia gravis exacerbation with a Hx of severe GERD (treated with nissen fundoplication), and diplopia.       SLP Plan  Discharge SLP treatment due to (comment);Other (Comment)(toleration of reg/thin diet)       Recommendations  Diet recommendations: Regular;Thin liquid Liquids provided via: Straw;Cup Medication Administration: Whole meds with liquid Supervision: Patient able to self feed                Oral Care Recommendations: Oral care BID;Patient independent with oral care Follow up Recommendations: None SLP Visit Diagnosis: Dysphagia, unspecified (R13.10) Plan: Discharge SLP treatment due to (comment);Other (Comment)(toleration of reg/thin diet)                       Dara Camargo 12/02/2018, 11:41 AM

## 2018-12-02 NOTE — Telephone Encounter (Signed)
Please reassure patient that I have already discussed with Dr. Lorraine Lax to complete plasmapheresis while hospitalized.

## 2018-12-02 NOTE — Progress Notes (Addendum)
NEUROLOGY PROGRESS NOTE  Subjective: Patient states that she feels somewhat uncomfortable but does not feel any difference with his second [place] treatment.  She is not having any side effects from the plasma exchange  NIF -35 and vital capacity 1.3 L  Exam: Vitals:   12/02/18 0542 12/02/18 0721  BP: (!) 125/55 (!) 108/46  Pulse: 70 77  Resp:  20  Temp: 97.8 F (36.6 C) 97.7 F (36.5 C)  SpO2: 100% 95%    Physical Exam   HEENT-  Normocephalic, no lesions, without obvious abnormality.  Normal external eye and conjunctiva.   Cardiovascular- S1-S2 audible, pulses palpable throughout   Lungs-no rhonchi or wheezing noted, no excessive working breathing.  Saturations within normal limits Abdomen- All 4 quadrants palpated and nontender Extremities- Warm, dry and intact Musculoskeletal-no joint tenderness, deformity or swelling Skin-warm and dry, no hyperpigmentation, vitiligo, or suspicious lesions    Neuro:  Mental Status: Alert, oriented, thought content appropriate.  Speech slightly dysarthric without evidence of aphasia.  Able to follow 3 step commands without difficulty. Cranial Nerves: II:  Visual fields grossly normal, diplopia with upward gaze III,IV, VI: ptosis not present, extra-ocular motions intact bilaterally pupils equal, round, reactive to light and accommodation.  Patient does have fatigability with upward gaze  V,VII: smile symmetric, facial light touch sensation normal bilaterally VIII: hearing normal bilaterally IX,X: Palate rises midline XI: bilateral shoulder shrug XII: midline tongue extension Motor: -Neck flexion extension is still weak and fatigable - Right upper extremity is 4/5 -Left upper extremity is 5/5 - Right knee extension is 5/5 -Left knee extension is 5/5 Sensory: Pinprick and light touch intact throughout, bilaterally     Medications:  Scheduled: . azaTHIOprine  75 mg Oral BID  . mouth rinse  15 mL Mouth Rinse BID  . oxybutynin  10 mg  Oral TID  . pyridostigmine  60 mg Oral TID PC  . Wellbutrin SR tablets  200 mg Oral BID   Continuous: . cefTRIAXone (ROCEPHIN)  IV 1 g (12/01/18 1911)  . citrate dextrose      Pertinent Labs/Diagnostics: None  Etta Quill PA-C [facility] [phone]   Assessment: Patient with myasthenia gravis and worsening symptoms including falls/weakness and at times diplopia.  Patient has received 2 plasma exchanges feels no significant difference.  At this time Dr. Posey Pronto would like to keep the patient in the hospital for all exchanges to receive maximal treatment and also physical therapy to confirm she is safe to go home. Patient told us that she would like [name] home but called Dr. Serita Grit office and said she is OK staying for full treatment. I will recommend that if she decides that she wants to desperately leave and come back for outpatient PLEX, she at least complete 3 rounds of PLEX in patient.    Recommendations: -Continue plasma exchange for total of 5 doses -Continue physical therapy -Will check back tomorrow to make final decision on discharge pending PT OT and clinical exam   12/02/2018, 8:53 AM  Attending Neurohospitalist Addendum Patient seen and examined with APP/Resident. Agree with the history and physical as documented above. Agree with the plan as documented, which I helped formulate. I have independently reviewed the chart, obtained history, review of systems and examined the patient.I have personally reviewed pertinent head/neck/spine imaging (CT/MRI). Please feel free to call with any questions. --- Amie Portland, MD [facility] Pager: [phone]  If 7pm to 7am, please call on call as listed on AMION.

## 2018-12-02 NOTE — Progress Notes (Signed)
PROGRESS NOTE  Erica Drake [id number] DOB: 1936/01/11 DOA: 11/27/2018 PCP: Prince Solian, MD  Brief History   Erica Drake is a 83 y.o. female with medical history significant of myasthenia gravis, breast cancer, interstitial cystitis, fibromyalgia, presents to the emergency department due to worsening of weakness since couple of months.  Patient has myasthenia gravis and she is on IVIG every 3 weeks.  Patient reports since last few weeks she is more weak, has neck pain, double vision, low energy, drooping of face and wheezing.  As per daughter at bedside reports  jaw tightness and difficulty swallowing.  Her current regime for myasthenia gravis: IVIG every 3 weeks, azathioprine 75 mg p.o. twice daily and Mestinon 60 mg 3 times daily.  She is compliant with her medication and she took all of her home medicines this morning.  She recently evaluated by her pulmonologist and placed her on 2 L of oxygen via nasal cannulae at bedtime due to low oxygen saturation.  Due to her worsening weakness she was told by her neurologist Dr. Posey Pronto to go to the emergency department for plasma exchange.  Patient reports dysuria, lower abdominal pain, hematuria since couple of days.  She has history of chronic interstitial cystitis.  She denies fever, chills, cough, congestion, runny nose, sore throat, recent COVID-19 exposure, headache, lightheadedness, dizziness, nausea, vomiting, diarrhea, decreased appetite, weight loss.  She lives alone at independent living facility and she is independent on her daily life activities.  She denies smoking, alcohol, illicit drug use.  She is compliant with her IVIG appointments.  ED Course: Patient is afebrile, no leukocytosis, neurology consulted by EDP-recommended plasmapheresis.  Chest x-ray is negative.  COVID test negative.  The patient was admitted to a step down bed. Her NIF is being monitored by respiratory therapy. Neurology is consulted. She has had a  Rt IJ tunnelled catheter placed for immediate use. She will be receiving plasma exchange to treat her myasthenia gravis exacerbation. She is on IVIG as outpatient. She states that lately it has not been helping. She is also found to have a UTI, and is being treated with IV ancef. Urine culture is pending.  Consultants   Neurology  Interventional Radiology  Procedures   Placement of tunneled Catheter in Right IJ  Plasma exchange - pending  Antibiotics   Anti-infectives (From admission, onward)   Start     Dose/Rate Route Frequency Ordered Stop   12/02/18 1300  trimethoprim (TRIMPEX) tablet 100 mg     100 mg Oral Daily 12/02/18 1203     11/28/18 1900  cefTRIAXone (ROCEPHIN) 1 g in sodium chloride 0.9 % 100 mL IVPB     1 g 200 mL/hr over 30 Minutes Intravenous Every 24 hours 11/28/18 1850     11/28/18 0852  ceFAZolin (ANCEF) IVPB 1 g/50 mL premix     over 30 Minutes  Continuous PRN 11/28/18 0852 11/28/18 0842   11/28/18 0834  ceFAZolin (ANCEF) 2-4 GM/100ML-% IVPB    Note to Pharmacy: [name]   : cabinet override      11/28/18 0834 11/28/18 2044     Subjective  The patient is resting comfortably. She is complaining of dysuria.   Objective   Vitals:  Vitals:   12/02/18 0721 12/02/18 1225  BP: (!) 108/46 (!) 144/48  Pulse: 77 92  Resp: 20 18  Temp: 97.7 F (36.5 C) 98.2 F (36.8 C)  SpO2: 95% 96%   Exam:  Constitutional:   The patient is awake,  alert, and oriented x 3. No acute distress. Respiratory:   No increased work of breathing.  No wheezes, rales, or rhonchi  No tactile fremitus Cardiovascular:   Regular rate and rhythm  No murmurs, ectopy, or gallups.  No lateral PMI. No thrills. Abdomen:   Abdomen is soft, non-tender, non-distended  No hernias, masses, or organomegaly  Normoactive bowel sounds.  Musculoskeletal:   No cyanosis, clubbing, or edema Skin:   No rashes, lesions, ulcers  palpation of skin: no induration or  nodules Neurologic:   CN 2-12 intact  Sensation all 4 extremities intact Psychiatric:   Mental status o Mood, affect appropriate o Orientation to person, place, time   judgment and insight appear intact  I have personally reviewed the following:   Today's Data   Vitals, Respiratory data, CBC, CMP  Scheduled Meds:  azaTHIOprine  75 mg Oral BID   clonazePAM  1 mg Oral BID   mouth rinse  15 mL Mouth Rinse BID   phenazopyridine  200 mg Oral TID WC   pyridostigmine  60 mg Oral TID PC   trimethoprim  100 mg Oral Daily   Wellbutrin SR tablets  200 mg Oral BID   Continuous Infusions:  cefTRIAXone (ROCEPHIN)  IV 1 g (12/01/18 1911)   citrate dextrose      Principal Problem:   Myasthenia gravis with exacerbation (HCC) Active Problems:   COPD mixed type (HCC)   Fibromyalgia   Nocturnal hypoxemia   Anxiety   Acute lower UTI   AKI (acute kidney injury) ([place])   Myasthenia gravis in crisis ([place])   LOS: 4 days   A & P  Myasthenia gravis with exacerbation: Patient has history of seropositive myasthenia gravis and she is on IVIG every 3 weeks, azathioprine 75 mg p.o. twice daily and Mestinon 60 mg 3 times daily.  Presented with worsening of weakness. She states that she doesn't think that the IVIG has been helping for some time. Neurology has been consulted. She has had a Right IJ tunneled catheter placed for plasma exchange which is to begin today. She has been getting NIF and VC checks q shift, but only one has been done. It demonstrated a poor NIF at (-20) and VC of .6L. Oxygen saturations have remained stable at 97%. The patient is not on high dose steroids due to a stated allergy resulting in shortness of breath and tongue swelling. I have discussed the patient in detail with Dr. Ruthann Cancer, who stated that she would put her on her list. I will also discuss the patient with the hospitalist team coming on overnight. I have asked that nursing request a stat NIF and VC on the  patient now. I have also ordered a stat ABG. Dr. Ruthann Cancer feels that the patient likely only requires BIPAP. In the last 24 hours the patient's respiratory parameters have improved. NIF this morning was -21 and VC was 1.2L. She has maintained her oxygen saturations well. She will remain inpatient to complete her course of plasma exchange.  Urinary tract infection: Patietn ihas been started on ceftriaxone this was changed for Ancef. Patient is afebrile, no leukocytosis. UA is positive for leukocytes, patient is complaining of dysuria, hematuria, lower abdominal pain. Urine culture is pending. She has been restarted on pyridium and Klonopine for her interstitial cystitis.   Diarrhea: Low suspicion for infectious cause. Imodium given.   AKI on CKD: Resolved. Avoid nephrotoxic medication and hypotension. Monitor creatinine, electrolytes, and volume status.  COPD: Not in acute exacerbation. Patient  is maintaining oxygen saturation on room air. On continuous pulse ox. We will continue home inhalers.  Anxiety: Stable. Will avoid use of anxiolytics at this time due to low NIF and VC. Continue  Wellbutrin. Clonazepam will be restarted.  I have seen and examined this patient myself. I have spent 32 minutes in her evaluation and care.  DVT prophylaxis: TED/SCD Code Status: Full code Family Communication: None available. Disposition Plan: To be determined   Kyelle Urbas, DO [facility] Direct contact: see [email or web address]  7PM-7AM contact night coverage as above 12/02/2018, 4:38 PM  LOS: 0 days

## 2018-12-02 NOTE — Care Management Important Message (Signed)
Important Message  Patient Details  Name: Erica Drake MRN: [id number] Date of Birth: September 04, 1935   Medicare Important Message Given:  Yes     Estoria Geary 12/02/2018, 3:40 PM

## 2018-12-02 NOTE — Progress Notes (Signed)
NIF-35 VC- 1.3L   Good patient effort. RT will continue to monitor.

## 2018-12-02 NOTE — Progress Notes (Signed)
NIF: -38 VC: 1.5L with good pt effort.

## 2018-12-02 NOTE — Progress Notes (Signed)
Physical Therapy Treatment Patient Details Name: Erica Drake MRN: [id number] DOB: [birth year] Today's Date: 12/02/2018    History of Present Illness pt is an 83 yo female admitted for myasthenia gravis exacerbation and in hospital for IV plasmaphoresis. PMH: breast cancer, fibromyalgia, breast CA, interstitial cystitis, vertigo.    PT Comments    Patient progressing with ambulation distance/ endurance.  Encouraged hallway ambulation with nursing assist as well.  Tolerated increased distance and able to walk in room some with min A no device.  Feel she will be able to d/c home to [place] with follow up HHPT.  PT to follow acutely.   Follow Up Recommendations  Home health PT     Equipment Recommendations  None recommended by PT    Recommendations for Other Services       Precautions / Restrictions Precautions Precautions: Fall Precaution Comments: per pt and daughter h/o stumbles    Mobility  Bed Mobility Overal bed mobility: Modified Independent                Transfers Overall transfer level: Needs assistance   Transfers: Sit to/from Stand Sit to Stand: Supervision         General transfer comment: supervision for safety  Ambulation/Gait Ambulation/Gait assistance: Min guard;Min assist Gait Distance (Feet): 400 Feet(15') Assistive device: Rolling walker (2 wheeled);None Gait Pattern/deviations: Step-through pattern;Decreased stride length;Shuffle     General Gait Details: slow but steady for the most part, significantly slowed on final lap and needed to stop and rest per pt.  Also walked in room no device with min A for balance/safety   Stairs             Wheelchair Mobility    Modified Rankin (Stroke Patients Only)       Balance Overall balance assessment: Needs assistance   Sitting balance-Leahy Scale: Good       Standing balance-Leahy Scale: Fair Standing balance comment: walking some in room with min A                             Cognition Arousal/Alertness: Awake/alert Behavior During Therapy: WFL for tasks assessed/performed Overall Cognitive Status: Within Functional Limits for tasks assessed                                        Exercises Other Exercises Other Exercises: Supine postural strengthening chin tuck/head press x 5; shoulder press x 5 & PPT x 5 all w/ 5 sec holds    General Comments        Pertinent Vitals/Pain Pain Assessment: 0-10 Pain Score: 7  Pain Location: L chest due to port and bladder spasms Pain Descriptors / Indicators: Discomfort;Grimacing Pain Intervention(s): Monitored during session;Limited activity within patient's tolerance    Home Living                      Prior Function            PT Goals (current goals can now be found in the care plan section) Progress towards PT goals: Progressing toward goals    Frequency    Min 3X/week      PT Plan Current plan remains appropriate    Co-evaluation              AM-PAC PT "6 Clicks" Mobility   Outcome  Measure  Help needed turning from your back to your side while in a flat bed without using bedrails?: None Help needed moving from lying on your back to sitting on the side of a flat bed without using bedrails?: None Help needed moving to and from a bed to a chair (including a wheelchair)?: A Little Help needed standing up from a chair using your arms (e.g., wheelchair or bedside chair)?: A Little Help needed to walk in hospital room?: A Little Help needed climbing 3-5 steps with a railing? : A Little 6 Click Score: 20    End of Session   Activity Tolerance: Patient tolerated treatment well Patient left: in bed;with call bell/phone within reach   PT Visit Diagnosis: Muscle weakness (generalized) (M62.81);Difficulty in walking, not elsewhere classified (R26.2);Other symptoms and signs involving the nervous system (R29.898)     Time: [id number] PT Time  Calculation (min) (ACUTE ONLY): 39 min  Charges:  $Gait Training: 8-22 mins $Therapeutic Exercise: 8-22 mins $Therapeutic Activity: 8-22 mins                     Magda Kiel, PT [place] [phone] 12/02/2018    Erica Drake 12/02/2018, 6:02 PM

## 2018-12-02 NOTE — Telephone Encounter (Signed)
Called and spoke to patient and reassured her that Dr. Posey Pronto has discussed with Dr. Lorraine Lax that she is to complete the plasmapheresis while hospitalized.  Patient was relieved to know they discussed it. Verbalized understanding and thanked me for calling.

## 2018-12-02 NOTE — Telephone Encounter (Signed)
Patient called regarding her being in the hospital and needing to Let Dr. Posey Pronto and Dr. Malen Gauze know that she will stay in the hospital while having the Series of Plasma Treatments. She said originally she had told Dr. Malen Gauze that she did not want to stay in the hospital. She wants to know if Dr. Posey Pronto can let Dr. Malen Gauze know? Please Call [phone]. Thanks

## 2018-12-03 ENCOUNTER — Telehealth: Payer: BLUE CROSS/BLUE SHIELD

## 2018-12-03 LAB — POCT I-STAT, CHEM 8
BUN: 9 mg/dL (ref 8–23)
Calcium, Ion: 1.32 mmol/L (ref 1.15–1.40)
Chloride: 102 mmol/L (ref 98–111)
Creatinine, Ser: 1.1 mg/dL — ABNORMAL HIGH (ref 0.44–1.00)
Glucose, Bld: 103 mg/dL — ABNORMAL HIGH (ref 70–99)
HCT: 35 % — ABNORMAL LOW (ref 36.0–46.0)
Hemoglobin: 11.9 g/dL — ABNORMAL LOW (ref 12.0–15.0)
Potassium: 3.8 mmol/L (ref 3.5–5.1)
Sodium: 142 mmol/L (ref 135–145)
TCO2: 28 mmol/L (ref 22–32)

## 2018-12-03 LAB — BASIC METABOLIC PANEL
Anion gap: 7 (ref 5–15)
BUN: 8 mg/dL (ref 8–23)
CO2: 27 mmol/L (ref 22–32)
Calcium: 9 mg/dL (ref 8.9–10.3)
Chloride: 107 mmol/L (ref 98–111)
Creatinine, Ser: 1.04 mg/dL — ABNORMAL HIGH (ref 0.44–1.00)
GFR calc Af Amer: 59 mL/min — ABNORMAL LOW (ref >60)
GFR calc non Af Amer: 51 mL/min — ABNORMAL LOW (ref >60)
Glucose, Bld: 94 mg/dL (ref 70–99)
Potassium: 3.9 mmol/L (ref 3.5–5.1)
Sodium: 141 mmol/L (ref 135–145)

## 2018-12-03 MED ORDER — ACD FORMULA A 0.73-2.45-2.2 GM/100ML VI SOLN
Status: AC
  Filled 2018-12-03: qty 500

## 2018-12-03 MED ORDER — CALCIUM GLUCONATE-NACL 2-0.675 GM/100ML-% IV SOLN
2.0000 g | Freq: Once | INTRAVENOUS | Status: AC
  Administered 2018-12-03: 2000 mg via INTRAVENOUS
  Filled 2018-12-03: qty 100

## 2018-12-03 MED ORDER — SODIUM CHLORIDE 0.9 % IV SOLN
INTRAVENOUS | Status: AC
  Administered 2018-12-03: 10:00:00 via INTRAVENOUS_CENTRAL
  Filled 2018-12-03 (×3): qty 200

## 2018-12-03 MED ORDER — ACD FORMULA A 0.73-2.45-2.2 GM/100ML VI SOLN
500.0000 mL | Status: DC
  Administered 2018-12-03: 500 mL via INTRAVENOUS
  Filled 2018-12-03 (×2): qty 500

## 2018-12-03 MED ORDER — DIPHENHYDRAMINE HCL 25 MG PO CAPS
ORAL_CAPSULE | ORAL | Status: AC
  Filled 2018-12-03: qty 1

## 2018-12-03 MED ORDER — CALCIUM CARBONATE ANTACID 500 MG PO CHEW
2.0000 | CHEWABLE_TABLET | ORAL | Status: DC
  Administered 2018-12-03 (×2): 400 mg via ORAL
  Filled 2018-12-03: qty 2

## 2018-12-03 MED ORDER — DIPHENHYDRAMINE HCL 25 MG PO CAPS: 25.0000 mg | ORAL_CAPSULE | Freq: Four times a day (QID) | ORAL | Status: DC | PRN

## 2018-12-03 MED ORDER — HEPARIN SODIUM (PORCINE) 1000 UNIT/ML IJ SOLN
INTRAMUSCULAR | Status: AC
  Filled 2018-12-03: qty 3

## 2018-12-03 MED ORDER — ACETAMINOPHEN 325 MG PO TABS
ORAL_TABLET | ORAL | Status: AC
  Filled 2018-12-03: qty 2

## 2018-12-03 MED ORDER — CALCIUM CARBONATE ANTACID 500 MG PO CHEW
CHEWABLE_TABLET | ORAL | Status: AC
  Filled 2018-12-03: qty 2

## 2018-12-03 MED ORDER — ACETAMINOPHEN 325 MG PO TABS: 650.0000 mg | ORAL_TABLET | ORAL | Status: DC | PRN

## 2018-12-03 MED ORDER — SODIUM CHLORIDE 0.9 % IV SOLN
2.0000 g | Freq: Once | INTRAVENOUS | Status: DC
  Filled 2018-12-03: qty 20

## 2018-12-03 MED ORDER — HEPARIN SODIUM (PORCINE) 1000 UNIT/ML IJ SOLN: 1000.0000 [IU] | Freq: Once | INTRAMUSCULAR | Status: DC

## 2018-12-03 MED ORDER — LOPERAMIDE HCL 2 MG PO CAPS
4.0000 mg | ORAL_CAPSULE | Freq: Every day | ORAL | Status: DC
  Administered 2018-12-03 – 2018-12-04 (×2): 4 mg via ORAL
  Filled 2018-12-03 (×2): qty 2

## 2018-12-03 NOTE — Progress Notes (Signed)
RT at bedside to perform NIF and VC with pt. Transport also at bedside. Pt refusing NIF and VC at this time because transport was waiting to take pt to plasma lab.

## 2018-12-03 NOTE — Progress Notes (Signed)
OT Cancellation Note  Patient Details Name: Erica Drake MRN: [id number] DOB: Mar 28, 1935   Cancelled Treatment:    Reason Eval/Treat Not Completed: Patient at procedure or test/ unavailable. Will follow and see as able.   Delight Stare, OT Acute Rehabilitation Services Pager [phone] Office [phone]   Delight Stare 12/03/2018, 11:10 AM

## 2018-12-03 NOTE — Progress Notes (Signed)
NIF: -30 VC: 1.4L  With excellent patient effort.

## 2018-12-03 NOTE — Progress Notes (Signed)
NIF: -32 VC: 1.5L with good pt effort.

## 2018-12-03 NOTE — Progress Notes (Signed)
PROGRESS NOTE  Erica Drake [id number] DOB: 1935/07/11 DOA: 11/27/2018 PCP: Prince Solian, MD  Brief History   Erica Drake is a 83 y.o. female with medical history significant of myasthenia gravis, breast cancer, interstitial cystitis, fibromyalgia, presents to the emergency department due to worsening of weakness since couple of months.  Patient has myasthenia gravis and she is on IVIG every 3 weeks.  Patient reports since last few weeks she is more weak, has neck pain, double vision, low energy, drooping of face and wheezing.  As per daughter at bedside reports jaw tightness and difficulty swallowing.  Her current regime for myasthenia gravis: IVIG every 3 weeks, azathioprine 75 mg p.o. twice daily and Mestinon 60 mg 3 times daily.  She is compliant with her medication and she took all of her home medicines this morning.  She recently evaluated by her pulmonologist and placed her on 2 L of oxygen via nasal cannulae at bedtime due to low oxygen saturation.  Due to her worsening weakness she was told by her neurologist Dr. Posey Pronto to go to the emergency department for plasma exchange.  Patient reports dysuria, lower abdominal pain, hematuria since couple of days.  She has history of chronic interstitial cystitis.  She denies fever, chills, cough, congestion, runny nose, sore throat, recent COVID-19 exposure, headache, lightheadedness, dizziness, nausea, vomiting, diarrhea, decreased appetite, weight loss.  She lives alone at independent living facility and she is independent on her daily life activities.  She denies smoking, alcohol, illicit drug use.  She is compliant with her IVIG appointments.  ED Course: Patient is afebrile, no leukocytosis, neurology consulted by EDP who recommended plasmapheresis.  Chest x-ray is negative.  COVID test negative.  The patient was admitted to a step down bed. Her NIF is being monitored by respiratory therapy. Neurology is consulted. She has had  a Rt IJ tunnelled catheter placed for immediate use. She will be receiving plasma exchange to treat her myasthenia gravis exacerbation. She is on IVIG as outpatient. She states that lately it has not been helping. She is also found to have a UTI, and is being treated with IV ancef. Urine culture is pending.  Neurology has recommended inpatient rehab at time of discharge instead of discharge to home.  Consultants   Neurology  Interventional Radiology  Inpatient Rehab  Procedures   Placement of tunneled Catheter in Right IJ  Plasma exchange  Antibiotics   Anti-infectives (From admission, onward)   Start     Dose/Rate Route Frequency Ordered Stop   12/02/18 1300  trimethoprim (TRIMPEX) tablet 100 mg     100 mg Oral Daily 12/02/18 1203     11/28/18 1900  cefTRIAXone (ROCEPHIN) 1 g in sodium chloride 0.9 % 100 mL IVPB     1 g 200 mL/hr over 30 Minutes Intravenous Every 24 hours 11/28/18 1850     11/28/18 0852  ceFAZolin (ANCEF) IVPB 1 g/50 mL premix     over 30 Minutes  Continuous PRN 11/28/18 0852 11/28/18 0842   11/28/18 0834  ceFAZolin (ANCEF) 2-4 GM/100ML-% IVPB    Note to Pharmacy: [name]   : cabinet override      11/28/18 0834 11/28/18 2044     Subjective  The patient is resting comfortably. She is still having diarrhea.  Objective   Vitals:  Vitals:   12/03/18 1110 12/03/18 1520  BP: 129/77 (!) 141/44  Pulse: 78 72  Resp: 19 14  Temp: (!) 97.5 F (36.4 C) 98 F (36.7  C)  SpO2: 96% 94%   Exam:  Constitutional:   The patient is awake, alert, and oriented x 3. No acute distress. Respiratory:   No increased work of breathing.  No wheezes, rales, or rhonchi  No tactile fremitus Cardiovascular:   Regular rate and rhythm  No murmurs, ectopy, or gallups.  No lateral PMI. No thrills. Abdomen:   Abdomen is soft, non-tender, non-distended  No hernias, masses, or organomegaly  Normoactive bowel sounds.  Musculoskeletal:   No cyanosis,  clubbing, or edema Skin:   No rashes, lesions, ulcers  palpation of skin: no induration or nodules Neurologic:   CN 2-12 intact  Sensation all 4 extremities intact Psychiatric:   Mental status o Mood, affect appropriate o Orientation to person, place, time   judgment and insight appear intact  I have personally reviewed the following:   Today's Data   Vitals, Respiratory data, CBC, CMP  Scheduled Meds:  azaTHIOprine  75 mg Oral BID   clonazePAM  1 mg Oral BID   heparin       mouth rinse  15 mL Mouth Rinse BID   pyridostigmine  60 mg Oral TID PC   trimethoprim  100 mg Oral Daily   Wellbutrin SR tablets  200 mg Oral BID   Continuous Infusions:  cefTRIAXone (ROCEPHIN)  IV 1 g (12/02/18 2001)    Principal Problem:   Myasthenia gravis with exacerbation ([place]) Active Problems:   COPD mixed type ([facility])   Fibromyalgia   Nocturnal hypoxemia   Anxiety   Acute lower UTI   AKI (acute kidney injury) ([place])   Myasthenia gravis in crisis ([place])   LOS: 5 days   A & P  Myasthenia gravis with exacerbation: Patient has history of seropositive myasthenia gravis and she is on IVIG every 3 weeks, azathioprine 75 mg p.o. twice daily and Mestinon 60 mg 3 times daily.  Presented with worsening of weakness. She states that she doesn't think that the IVIG has been helping for some time. Neurology has been consulted. She has had a Right IJ tunneled catheter placed for plasma exchange which is to begin today. She has been getting NIF and VC checks q shift, but only one has been done. It demonstrated a poor NIF at (-20) and VC of .6L. Oxygen saturations have remained stable at 97%. The patient is not on high dose steroids due to a stated allergy resulting in shortness of breath and tongue swelling. I have discussed the patient in detail with Dr. Ruthann Cancer, who stated that she would put her on her list. I will also discuss the patient with the hospitalist team coming on overnight. I have  asked that nursing request a stat NIF and VC on the patient now. I have also ordered a stat ABG. Dr. Ruthann Cancer feels that the patient likely only requires BIPAP. In the last 24 hours the patient's respiratory parameters have improved. NIF this morning was -30 and VC was 1.4L. She has maintained her oxygen saturations well. She will remain inpatient to complete her course of plasma exchange. Then possibly she will go to inpatient rehab.  Urinary tract infection: Patient ihas been started on ceftriaxone this was changed for Ancef. Patient is afebrile, no leukocytosis. UA is positive for leukocytes, patient is complaining of dysuria, hematuria, lower abdominal pain. Urine culture is pending. She has been restarted on pyridium and Klonopine for her interstitial cystitis. Antibiotics have been discontinued on 10/7/21020.  Diarrhea: Low suspicion for infectious cause. Imodium given.   AKI  on CKD: Resolved. Avoid nephrotoxic medication and hypotension. Monitor creatinine, electrolytes, and volume status.  COPD: Not in acute exacerbation. Patient is maintaining oxygen saturation on room air. On continuous pulse ox. We will continue home inhalers.  Anxiety: Stable. Will avoid use of anxiolytics at this time due to low NIF and VC. Continue  Wellbutrin. Clonazepam will be restarted.  I have seen and examined this patient myself. I have spent 30 minutes in her evaluation and care.  DVT prophylaxis: TED/SCD Code Status: Full code Family Communication: None available. Disposition Plan: To be determined   Jasmyne Lodato, DO [facility] Direct contact: see [email or web address]  7PM-7AM contact night coverage as above 12/03/2018, 4:13 PM  LOS: 0 days

## 2018-12-03 NOTE — Progress Notes (Addendum)
NEUROLOGY PROGRESS NOTE  Subjective: Does not feel any stronger after 2 doses PLEX and has had no SE of PLEX  Exam: Vitals:   12/03/18 0418 12/03/18 0838  BP: (!) 146/55 (!) 152/69  Pulse: 86   Resp:    Temp: 98.2 F (36.8 C) 98 F (36.7 C)  SpO2: 92% 96%    Physical Exam   HEENT-  Normocephalic, no lesions, without obvious abnormality.  Normal external eye and conjunctiva.   Musculoskeletal-no joint tenderness, deformity or swelling Skin-warm and dry, no hyperpigmentation, vitiligo, or suspicious lesions    Neuro:  Mental Status: Alert, oriented, thought content appropriate.  Speech fluent without evidence of aphasia.  Able to follow 3 step commands without difficulty. Cranial Nerves: II:  Visual fields grossly normal,  III,IV, VI: ptosis not present, extra-ocular motions intact bilaterally pupils equal, round, reactive to light and accommodation V,VII: smile symmetric, facial light touch sensation normal bilaterally VIII: hearing normal bilaterally IX,X: Palate rises midline XI: bilateral shoulder shrug XII: midline tongue extension Motor: 4/5 strength throughout including head flex/ext, jaw strength     Medications:  Scheduled: . azaTHIOprine  75 mg Oral BID  . calcium carbonate      . calcium carbonate  2 tablet Oral Q3H  . clonazePAM  1 mg Oral BID  . heparin  1,000 Units Intracatheter Once  . mouth rinse  15 mL Mouth Rinse BID  . phenazopyridine  200 mg Oral TID WC  . pyridostigmine  60 mg Oral TID PC  . trimethoprim  100 mg Oral Daily  . Wellbutrin SR tablets  200 mg Oral BID    Pertinent Labs/Diagnostics: none NIF -38 VC 1.5    Etta Quill PA-C [facility] [phone]   Assessment: Patient with myasthenia gravis and worsening symptoms including falls/weakness and at times diplopia.  Patient will received 3rd plasma exchange today but still feels no significant difference.  At this time Dr. Posey Pronto would like to keep the patient in  the hospital for all exchanges to receive maximal treatment and also physical therapy to confirm she is safe to go home. Patient is willing to stay in hospital    Recommendations: -Continue plasma exchange for total of 5 doses -Continue physical therapy -Patient will stay to get full doses --will need CW to work with out patient rehab versus CIR  12/03/2018, 9:23 AM   Attending Neurohospitalist Addendum Patient seen and examined with APP/Resident. Agree with the history and physical as documented above. Agree with the plan as documented, which I helped formulate. I have independently reviewed the chart, obtained history, review of systems and examined the patient.I have personally reviewed pertinent head/neck/spine imaging (CT/MRI). Please feel free to call with any questions. --- Amie Portland, MD [facility] Pager: [phone]  If 7pm to 7am, please call on call as listed on AMION.

## 2018-12-03 NOTE — Consult Note (Signed)
Inpatient Rehabilitation Admissions Coordinator  Inpatient rehab consult received. I met with patient at bedside. Pt is doing very well with therapy and therapy recommends home with [place] at her ILF at [facility]. Pt is not in need of an inpt rehab admission at this functional high level. Pt is aware and she an her daughter are discussing resources at [employer]. Pt not a candidate to admit to AIR/CIR.  Danne Baxter, RN, MSN Rehab Admissions Coordinator [phone] 12/03/2018 4:57 PM

## 2018-12-03 NOTE — Telephone Encounter
New Neurology Specialty Intake    MD Name, if provided: Lorelei Pont     Reason for specialty visit (Diagnosis/Symptom): Myasthenia gravis     Have you seen a neurologist before?  [x]  Yes  []  No    ? If so, name of Neurologist: Dr.Donika Posey Pronto- in [place] you being referred by a physician?  []  Yes  []  No    ? If so, MD name and specialty: Irene Pap- Will be her primary and will request a referral.  For MS or Stroke - Were MRI Images Received?  []  Yes  []  No  []  N/A    Verified current PCP is listed in CC.    Advised patient to send records.     Patient has been notified of the 24-48 hour turnaround time.

## 2018-12-04 ENCOUNTER — Telehealth: Payer: Self-pay | Admitting: Neurology

## 2018-12-04 NOTE — Progress Notes (Signed)
Physical Therapy Treatment Patient Details Name: Erica Drake MRN: [id number] DOB: 07-11-1935 Today's Date: 12/04/2018    History of Present Illness Pt is an 83 yo female admitted for myasthenia gravis exacerbation and in hospital for IV plasmaphoresis. PMH: breast cancer, fibromyalgia, breast CA, interstitial cystitis, vertigo.    PT Comments    Pt continuing to make steady progress with functional mobility. Will continue to follow acutely as per PT POC. Pt would continue to benefit from skilled physical therapy services at this time while admitted and after d/c to address the below listed limitations in order to improve overall safety and independence with functional mobility.   Follow Up Recommendations  Home health PT     Equipment Recommendations  None recommended by PT    Recommendations for Other Services       Precautions / Restrictions Precautions Precautions: Fall Restrictions Weight Bearing Restrictions: No    Mobility  Bed Mobility Overal bed mobility: Modified Independent                Transfers Overall transfer level: Needs assistance Equipment used: Rolling walker (2 wheeled) Transfers: Sit to/from Stand Sit to Stand: Supervision         General transfer comment: supervision for safety  Ambulation/Gait Ambulation/Gait assistance: Min guard;Supervision Gait Distance (Feet): 400 Feet Assistive device: Rolling walker (2 wheeled) Gait Pattern/deviations: Step-through pattern;Decreased stride length;Shuffle Gait velocity: decreased   General Gait Details: pt with mild instability with ambulation; however, no overt LOB or need for physical assistance   Stairs             Wheelchair Mobility    Modified Rankin (Stroke Patients Only)       Balance Overall balance assessment: Needs assistance Sitting-balance support: No upper extremity supported;Feet supported Sitting balance-Leahy Scale: Good     Standing balance support:  During functional activity;No upper extremity supported Standing balance-Leahy Scale: Fair                              Cognition Arousal/Alertness: Awake/alert Behavior During Therapy: WFL for tasks assessed/performed Overall Cognitive Status: Within Functional Limits for tasks assessed                                        Exercises      General Comments        Pertinent Vitals/Pain Pain Assessment: No/denies pain    Home Living                      Prior Function            PT Goals (current goals can now be found in the care plan section) Acute Rehab PT Goals PT Goal Formulation: With patient Time For Goal Achievement: 12/12/18 Potential to Achieve Goals: Good Progress towards PT goals: Progressing toward goals    Frequency    Min 3X/week      PT Plan Current plan remains appropriate    Co-evaluation              AM-PAC PT "6 Clicks" Mobility   Outcome Measure  Help needed turning from your back to your side while in a flat bed without using bedrails?: None Help needed moving from lying on your back to sitting on the side of a flat bed without using bedrails?: None Help  needed moving to and from a bed to a chair (including a wheelchair)?: A Little Help needed standing up from a chair using your arms (e.g., wheelchair or bedside chair)?: A Little Help needed to walk in hospital room?: A Little Help needed climbing 3-5 steps with a railing? : A Little 6 Click Score: 20    End of Session   Activity Tolerance: Patient tolerated treatment well Patient left: in bed;with call bell/phone within reach;with bed alarm set Nurse Communication: Mobility status PT Visit Diagnosis: Muscle weakness (generalized) (M62.81);Difficulty in walking, not elsewhere classified (R26.2);Other symptoms and signs involving the nervous system [id number])     Time: [id number] PT Time Calculation (min) (ACUTE ONLY): 21 min  Charges:   $Gait Training: 8-22 mins                     Sherie Don, [name], DPT  Acute Rehabilitation Services Pager [phone] Office [place] 12/04/2018, 4:45 PM

## 2018-12-04 NOTE — Telephone Encounter (Signed)
Patient is in the hospital and daughter was wanting to speak with someone or Dr. Posey Pronto concerning this. Thanks!

## 2018-12-04 NOTE — Progress Notes (Signed)
Results from NIF/VC for tonight.  NIF was -26 with good patient effort.   VC was 1.2L with excellent patient effort.

## 2018-12-04 NOTE — TOC Initial Note (Signed)
Transition of Care [facility]) - Initial/Assessment Note    Patient Details  Name: Erica Drake MRN: [id number] Date of Birth: 25-Dec-1935  Transition of Care [facility]) CM/SW Contact:    Vinie Sill, [place] Phone Number: 12/04/2018, 11:53 AM  Clinical Narrative:                   CSW visit with the patient at bedside along with her daughter,Erica Drake. Patient states she is from [place]. Patient states she lives alone. CSW discussed PT recommendation of home health PT. Patient is agreeable to discharge plan. Patient states she feels comfortable with going home and wants to return back to her apartment.    Thurmond Butts, MSW, [facility] Clinical Social Worker [phone]    Expected Discharge Plan: [place]     Patient Goals and CMS Choice        Expected Discharge Plan and Services Expected Discharge Plan: [place]       Living arrangements for the past 2 months: [place]                                      Prior Living Arrangements/Services Living arrangements for the past 2 months: [place] Lives with:: Self          Need for Family Participation in Patient Care: Yes (Comment) Care giver support system in place?: Yes (comment)   Criminal Activity/Legal Involvement Pertinent to Current Situation/Hospitalization: No - Comment as needed  Activities of Daily Living Home Assistive Devices/Equipment: Cane (specify quad or straight), Walker (specify type) ADL Screening (condition at time of admission) Patient's cognitive ability adequate to safely complete daily activities?: Yes Is the patient deaf or have difficulty hearing?: No Does the patient have difficulty seeing, even when wearing glasses/contacts?: No Does the patient have difficulty concentrating, remembering, or making decisions?: No Patient able to express need for assistance with ADLs?: Yes Does the  patient have difficulty dressing or bathing?: No Independently performs ADLs?: Yes (appropriate for developmental age) Does the patient have difficulty walking or climbing stairs?: Yes Weakness of Legs: Both Weakness of Arms/Hands: Both  Permission Sought/Granted Permission sought to share information with : Family Supports, Case Freight forwarder, Chartered certified accountant granted to share information with : Yes, Verbal Permission Granted  Share Information with NAME: [place] granted to share info w Relationship: daughter  Permission granted to share info w Contact Information: [phone]  Emotional Assessment Appearance:: Appears stated age Attitude/Demeanor/Rapport: Engaged, Self-Confident Affect (typically observed): Accepting, Pleasant Orientation: : Oriented to Situation, Oriented to  Time, Oriented to Place, Oriented to Self Alcohol / Substance Use: Not Applicable Psych Involvement: No (comment)  Admission diagnosis:  Myasthenia gravis with exacerbation ([place]) [G70.01] Myasthenia ([place]) [G70.00] Patient Active Problem List   Diagnosis Date Noted  . Spondylosis without myelopathy or radiculopathy, lumbar region 11/28/2018  . Myasthenia gravis in crisis ([place]) 11/28/2018  . Myasthenia gravis with exacerbation ([place]) 11/27/2018  . Anxiety 11/27/2018  . Acute lower UTI 11/27/2018  . AKI (acute kidney injury) ([place]) 11/27/2018  . Nocturnal hypoxemia 04/07/2018  . Type 2 diabetes mellitus without complication, without long-term current use of insulin ([place]) 12/16/2017  . Depression, major, single episode, complete remission ([place]) 12/16/2017  . Chronic pain of left knee 10/09/2017  . Chronic pain of right knee 10/09/2017  .  Unilateral primary osteoarthritis, left knee 10/09/2017  . Unilateral primary osteoarthritis, right knee 10/09/2017  . 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 Myasthenia gravis without acute exacerbation  ([place]) 07/16/2017  . H/O arthroscopy 03/06/2017  . Pain 02/20/2017  . Vaginal atrophy 08/16/2016  . Acute medial meniscal tear 07/18/2016  . Acute medial meniscal tear, left, subsequent encounter 07/18/2016  . Renal cyst 03/12/2016  . Asthmatic bronchitis, mild persistent, with acute exacerbation 12/29/2015  . Urethral caruncle 07/26/2015  . Chronic cholecystitis 01/14/2015  . Angioedema 05/23/2014  . Chronic interstitial cystitis 10/01/2011  . INSOMNIA 05/31/2007  . EUSTACHIAN TUBE DYSFUNCTION 04/30/2007  . Seasonal and perennial allergic rhinitis 04/30/2007  . COPD mixed type ([place]) 04/30/2007  . Esophageal reflux 04/30/2007  . Fibromyalgia 04/30/2007   PCP:  Prince Solian, MD Pharmacy:   [place], [place] [address] Phone: [phone] Fax: [place], [place] [address] Phone: [phone] Fax: [phone]     Social Determinants of Health (SDOH) Interventions    Readmission Risk Interventions No flowsheet data found.

## 2018-12-04 NOTE — Telephone Encounter (Signed)
Returned call to patient's daughter and answered questions.  Currently, patient will be completing 5 treatments while hospitalized.  If she is showing signs of improvement, plan to remove central line.  Daughter is planning on moving her mother to [place], when she is medically able to do so.  I will see her in the clinic following her follow-up.

## 2018-12-04 NOTE — Progress Notes (Signed)
PROGRESS NOTE  [name] [id number] DOB: 1935/04/02 DOA: 11/27/2018 PCP: Prince Solian, MD  Brief History   Erica Drake is a 83 y.o. female with medical history significant of myasthenia gravis, breast cancer, interstitial cystitis, fibromyalgia, presents to the emergency department due to worsening of weakness since couple of months.  Patient has myasthenia gravis and she is on IVIG every 3 weeks.  Patient reports since last few weeks she is more weak, has neck pain, double vision, low energy, drooping of face and wheezing.  As per daughter at bedside reports jaw tightness and difficulty swallowing.  Her current regime for myasthenia gravis: IVIG every 3 weeks, azathioprine 75 mg p.o. twice daily and Mestinon 60 mg 3 times daily.  She is compliant with her medication and she took all of her home medicines this morning.  She recently evaluated by her pulmonologist and placed her on 2 L of oxygen via nasal cannulae at bedtime due to low oxygen saturation.  Due to her worsening weakness she was told by her neurologist Dr. Posey Pronto to go to the emergency department for plasma exchange.  Patient reports dysuria, lower abdominal pain, hematuria since couple of days.  She has history of chronic interstitial cystitis.  She denies fever, chills, cough, congestion, runny nose, sore throat, recent COVID-19 exposure, headache, lightheadedness, dizziness, nausea, vomiting, diarrhea, decreased appetite, weight loss.  She lives alone at independent living facility and she is independent on her daily life activities.  She denies smoking, alcohol, illicit drug use.  She is compliant with her IVIG appointments.  ED Course: Patient is afebrile, no leukocytosis, neurology consulted by EDP who recommended plasmapheresis.  Chest x-ray is negative.  COVID test negative.  The patient was admitted to a step down bed. Her NIF is being monitored by respiratory therapy. Neurology is consulted. She has had  a Rt IJ tunnelled catheter placed for immediate use. She will be receiving plasma exchange to treat her myasthenia gravis exacerbation. She is on IVIG as outpatient. She states that lately it has not been helping. She is also found to have a UTI, and is being treated with IV ancef. Urine culture is pending.  Neurology has recommended inpatient rehab at time of discharge instead of discharge to home. She will complete her last treatment tomorrow. She will dc to inpatient rehab or SNF.  Consultants   Neurology  Interventional Radiology  Inpatient Rehab  Procedures   Placement of tunneled Catheter in Right IJ  Plasma exchange  Antibiotics   Anti-infectives (From admission, onward)   Start     Dose/Rate Route Frequency Ordered Stop   12/02/18 1300  trimethoprim (TRIMPEX) tablet 100 mg     100 mg Oral Daily 12/02/18 1203     11/28/18 1900  cefTRIAXone (ROCEPHIN) 1 g in sodium chloride 0.9 % 100 mL IVPB  Status:  Discontinued     1 g 200 mL/hr over 30 Minutes Intravenous Every 24 hours 11/28/18 1850 12/03/18 1612   11/28/18 0852  ceFAZolin (ANCEF) IVPB 1 g/50 mL premix     over 30 Minutes  Continuous PRN 11/28/18 0852 11/28/18 0842   11/28/18 0834  ceFAZolin (ANCEF) 2-4 GM/100ML-% IVPB    Note to Pharmacy: [name]   : cabinet override      11/28/18 0834 11/28/18 2044     Subjective  The patient is resting comfortably. No new complaints.  Objective   Vitals:  Vitals:   12/04/18 0807 12/04/18 1135  BP: 129/60 (!) 133/56  Pulse:    Resp:    Temp: 97.7 F (36.5 C) 98.3 F (36.8 C)  SpO2: 92% 94%   Exam:  Constitutional:   The patient is awake, alert, and oriented x 3. No acute distress. Respiratory:   No increased work of breathing.  No wheezes, rales, or rhonchi  No tactile fremitus Cardiovascular:   Regular rate and rhythm  No murmurs, ectopy, or gallups.  No lateral PMI. No thrills. Abdomen:   Abdomen is soft, non-tender, non-distended  No  hernias, masses, or organomegaly  Normoactive bowel sounds.  Musculoskeletal:   No cyanosis, clubbing, or edema Skin:   No rashes, lesions, ulcers  palpation of skin: no induration or nodules Neurologic:   CN 2-12 intact  Sensation all 4 extremities intact Psychiatric:   Mental status o Mood, affect appropriate o Orientation to person, place, time   judgment and insight appear intact  I have personally reviewed the following:   Today's Data   Vitals, Respiratory data, CBC, CMP  Scheduled Meds:  azaTHIOprine  75 mg Oral BID   clonazePAM  1 mg Oral BID   loperamide  4 mg Oral Daily   mouth rinse  15 mL Mouth Rinse BID   pyridostigmine  60 mg Oral TID PC   trimethoprim  100 mg Oral Daily   Wellbutrin SR tablets  200 mg Oral BID   Continuous Infusions:   Principal Problem:   Myasthenia gravis with exacerbation (HCC) Active Problems:   COPD mixed type (HCC)   Fibromyalgia   Nocturnal hypoxemia   Anxiety   Acute lower UTI   AKI (acute kidney injury) ([place])   Myasthenia gravis in crisis ([place])   LOS: 6 days   A & P  Myasthenia gravis with exacerbation: Patient has history of seropositive myasthenia gravis and she is on IVIG every 3 weeks, azathioprine 75 mg p.o. twice daily and Mestinon 60 mg 3 times daily.  Presented with worsening of weakness. She states that she doesn't think that the IVIG has been helping for some time. Neurology has been consulted. She has had a Right IJ tunneled catheter placed for plasma exchange which is to begin today. She has been getting NIF and VC checks q shift, but only one has been done. It demonstrated a poor NIF at (-20) and VC of .6L. Oxygen saturations have remained stable at 97%. The patient is not on high dose steroids due to a stated allergy resulting in shortness of breath and tongue swelling. I have discussed the patient in detail with Dr. Ruthann Cancer, who stated that she would put her on her list. I will also discuss the  patient with the hospitalist team coming on overnight. I have asked that nursing request a stat NIF and VC on the patient now. I have also ordered a stat ABG. Dr. Ruthann Cancer feels that the patient likely only requires BIPAP. In the last 24 hours the patient's respiratory parameters have improved. NIF this morning was -26 and VC was 1.3L. NIF seems very labile, although VC remains good. She has maintained her oxygen saturations well. She will remain inpatient to complete her course of plasma exchange. Then possibly she will go to inpatient rehab vs SNF.  Urinary tract infection: Patient ihas been started on ceftriaxone this was changed for Ancef. Patient is afebrile, no leukocytosis. UA is positive for leukocytes, patient is complaining of dysuria, hematuria, lower abdominal pain. Urine culture is pending. She has been restarted on pyridium and Klonopine for her interstitial cystitis. Antibiotics  have been discontinued on 10/7/21020.  Diarrhea: Low suspicion for infectious cause. Imodium given.   AKI on CKD: Resolved. Avoid nephrotoxic medication and hypotension. Monitor creatinine, electrolytes, and volume status.  COPD: Not in acute exacerbation. Patient is maintaining oxygen saturation on room air. On continuous pulse ox. We will continue home inhalers.  Anxiety: Stable. Will avoid use of anxiolytics at this time due to low NIF and VC. Continue  Wellbutrin. Clonazepam will be restarted.  I have seen and examined this patient myself. I have spent 32 minutes in her evaluation and care.  DVT prophylaxis: TED/SCD Code Status: Full code Family Communication: Daughter at bedside. Disposition Plan: Inpatient rehab vs SNF.  Natanya Holecek, DO [facility] Direct contact: see [email or web address]  7PM-7AM contact night coverage as above 12/04/2018, 3:04 PM  LOS: 0 days

## 2018-12-04 NOTE — Progress Notes (Signed)
NIF: -26 VC: 1.3L With excellent patient effort.

## 2018-12-05 ENCOUNTER — Encounter (HOSPITAL_COMMUNITY): Payer: Self-pay | Admitting: Student

## 2018-12-05 ENCOUNTER — Inpatient Hospital Stay (HOSPITAL_COMMUNITY): Payer: Medicare Other

## 2018-12-05 DIAGNOSIS — J449 Chronic obstructive pulmonary disease, unspecified: Secondary | ICD-10-CM

## 2018-12-05 DIAGNOSIS — N390 Urinary tract infection, site not specified: Secondary | ICD-10-CM

## 2018-12-05 HISTORY — PX: IR REMOVAL TUN CV CATH W/O FL: IMG2289

## 2018-12-05 LAB — POCT I-STAT, CHEM 8
BUN: 12 mg/dL (ref 8–23)
Calcium, Ion: 1.27 mmol/L (ref 1.15–1.40)
Chloride: 102 mmol/L (ref 98–111)
Creatinine, Ser: 1.3 mg/dL — ABNORMAL HIGH (ref 0.44–1.00)
Glucose, Bld: 102 mg/dL — ABNORMAL HIGH (ref 70–99)
HCT: 35 % — ABNORMAL LOW (ref 36.0–46.0)
Hemoglobin: 11.9 g/dL — ABNORMAL LOW (ref 12.0–15.0)
Potassium: 4 mmol/L (ref 3.5–5.1)
Sodium: 142 mmol/L (ref 135–145)
TCO2: 26 mmol/L (ref 22–32)

## 2018-12-05 LAB — CBC
HCT: 33.6 % — ABNORMAL LOW (ref 36.0–46.0)
Hemoglobin: 11.1 g/dL — ABNORMAL LOW (ref 12.0–15.0)
MCH: 34.6 pg — ABNORMAL HIGH (ref 26.0–34.0)
MCHC: 33 g/dL (ref 30.0–36.0)
MCV: 104.7 fL — ABNORMAL HIGH (ref 80.0–100.0)
Platelets: 188 10*3/uL (ref 150–400)
RBC: 3.21 MIL/uL — ABNORMAL LOW (ref 3.87–5.11)
RDW: 13.6 % (ref 11.5–15.5)
WBC: 5.7 10*3/uL (ref 4.0–10.5)
nRBC: 0 % (ref 0.0–0.2)

## 2018-12-05 LAB — BASIC METABOLIC PANEL
Anion gap: 9 (ref 5–15)
BUN: 10 mg/dL (ref 8–23)
CO2: 25 mmol/L (ref 22–32)
Calcium: 8.8 mg/dL — ABNORMAL LOW (ref 8.9–10.3)
Chloride: 108 mmol/L (ref 98–111)
Creatinine, Ser: 1.17 mg/dL — ABNORMAL HIGH (ref 0.44–1.00)
GFR calc Af Amer: 51 mL/min — ABNORMAL LOW (ref >60)
GFR calc non Af Amer: 44 mL/min — ABNORMAL LOW (ref >60)
Glucose, Bld: 100 mg/dL — ABNORMAL HIGH (ref 70–99)
Potassium: 3.9 mmol/L (ref 3.5–5.1)
Sodium: 142 mmol/L (ref 135–145)

## 2018-12-05 MED ORDER — HEPARIN SODIUM (PORCINE) 1000 UNIT/ML IJ SOLN
INTRAMUSCULAR | Status: AC
  Administered 2018-12-05: 3200 [IU]
  Filled 2018-12-05: qty 4

## 2018-12-05 MED ORDER — SODIUM CHLORIDE 0.9 % IV SOLN
2.0000 g | Freq: Once | INTRAVENOUS | Status: AC
  Administered 2018-12-05: 12:00:00 2 g via INTRAVENOUS
  Filled 2018-12-05: qty 20

## 2018-12-05 MED ORDER — ACETAMINOPHEN 325 MG PO TABS: 650.0000 mg | ORAL_TABLET | ORAL | Status: DC | PRN

## 2018-12-05 MED ORDER — CALCIUM CARBONATE ANTACID 500 MG PO CHEW: 1.0000 | CHEWABLE_TABLET | Freq: Every day | ORAL | 1 refills | Status: AC

## 2018-12-05 MED ORDER — CALCIUM CARBONATE ANTACID 500 MG PO CHEW
2.0000 | CHEWABLE_TABLET | ORAL | Status: AC
  Administered 2018-12-05: 11:00:00 400 mg via ORAL

## 2018-12-05 MED ORDER — HEPARIN SODIUM (PORCINE) 1000 UNIT/ML IJ SOLN
1000.0000 [IU] | Freq: Once | INTRAMUSCULAR | Status: AC
  Administered 2018-12-05: 12:00:00 3200 [IU]

## 2018-12-05 MED ORDER — DIPHENHYDRAMINE HCL 25 MG PO CAPS: 25.0000 mg | ORAL_CAPSULE | Freq: Four times a day (QID) | ORAL | Status: DC | PRN

## 2018-12-05 MED ORDER — ACD FORMULA A 0.73-2.45-2.2 GM/100ML VI SOLN
500.0000 mL | Status: DC
  Administered 2018-12-05: 09:00:00 500 mL via INTRAVENOUS
  Filled 2018-12-05: qty 500

## 2018-12-05 MED ORDER — CALCIUM CARBONATE ANTACID 500 MG PO CHEW
CHEWABLE_TABLET | ORAL | Status: AC
  Administered 2018-12-05: 400 mg via ORAL
  Filled 2018-12-05: qty 4

## 2018-12-05 MED ORDER — ACD FORMULA A 0.73-2.45-2.2 GM/100ML VI SOLN
Status: AC
  Administered 2018-12-05: 500 mL via INTRAVENOUS
  Filled 2018-12-05: qty 500

## 2018-12-05 MED ORDER — NONFORMULARY OR COMPOUNDED ITEM: 200.0000 mg | Freq: Two times a day (BID) | 1 refills | Status: DC

## 2018-12-05 MED ORDER — SODIUM CHLORIDE 0.9 % IV SOLN
INTRAVENOUS | Status: AC
  Administered 2018-12-05 (×3): via INTRAVENOUS_CENTRAL
  Filled 2018-12-05 (×3): qty 200

## 2018-12-05 NOTE — TOC Progression Note (Signed)
Transition of Care [facility]) - Progression Note    Patient Details  Name: Erica Drake MRN: [id number] Date of Birth: 1935/04/30  [facility]) CM/SW Contact  Oriel Ojo, Edson Snowball, RN Phone Number: 12/05/2018, 2:07 PM  Clinical Narrative:     Damaris Schooner with Butch Penny at [facility] instructed to fax home health PT orders to [facility] [phone], same done   Expected Discharge Plan: [place]    Expected Discharge Plan and Services Expected Discharge Plan: [place] arrangements for the past 2 months: [place] Expected Discharge Date: 12/05/18                                     Social Determinants of Health (SDOH) Interventions    Readmission Risk Interventions No flowsheet data found.

## 2018-12-05 NOTE — Progress Notes (Signed)
Pt and Pt's daughter given d/c education and all questions answered. Printed prescription in D/C packet, no equipment to deliver. IV removed. Pt taken to car by tech with all belongings.

## 2018-12-05 NOTE — Progress Notes (Signed)
NIF: -37 VC: 1.4L With excellent patient effort.

## 2018-12-05 NOTE — Discharge Summary (Addendum)
Patient Name:  Erica Drake  MRN: [id number]  PCP: Prince Solian, MD  DOB:  Mar 04, 1935       Date of Admission:  11/27/2018  Date of Discharge:  12/12/2018      Attending Physician: Dr. Rayne Du att. providers found     Discharge instruction: -please repeat BMP in 7-10 days -please f/u with PCP and her neurologist in 7-14 days     DISCHARGE DIAGNOSES: Principal Problem:   Myasthenia gravis with exacerbation ([place]) Active Problems:   COPD mixed type ([place])   Fibromyalgia   Nocturnal hypoxemia   Anxiety   Acute lower UTI   CKD (chronic kidney disease), stage III   Myasthenia gravis in crisis [facility])      DISPOSITION AND FOLLOW-UP: Erica Drake is to follow-up with the listed providers as detailed below, at patient's visiting, please address following issues:   Follow-up Information    Avva, Ravisankar, MD Follow up.   Specialty: Internal Medicine Contact information: [place] [address] [phone]        Narda Amber K, DO Follow up.   Specialty: Neurology Contact information: [address] [place] [address] [phone]          Discharge Instructions    Call MD for:  difficulty breathing, headache or visual disturbances   Complete by: As directed    Call MD for:  extreme fatigue   Complete by: As directed    Call MD for:  persistant nausea and vomiting   Complete by: As directed    Call MD for:  temperature >100.4   Complete by: As directed    Diet Carb Modified   Complete by: As directed    Increase activity slowly   Complete by: As directed        DISCHARGE MEDICATIONS: Allergies as of 12/05/2018      Reactions   Cortisone Shortness Of Breath, Swelling, Other (See Comments)   Tongue swelling, Injection into knees and hips ave not been issue 2020.    Sulfa Antibiotics Other (See Comments)   May possibly have caused deafness in one ear   Adhesive [tape] Itching, Other (See Comments)   Turns  the skin red where applied   Amlodipine Besylate Cough   Cellcept [mycophenolate] Other (See Comments)   Caused severe stomach cramping   Chlorhexidine Gluconate Itching, Other (See Comments)   Skin on face turned red, also   Clindamycin Itching   Dexamethasone Other (See Comments)   Per MD   Duloxetine Other (See Comments)   Reaction not recalled   Morphine Sulfate Other (See Comments)   Reaction not recalled   Niacin Other (See Comments)   Reaction not recalled   Other Other (See Comments)   Pollen - nasal reaction/congestion   Prednisone Other (See Comments)   Causes stomach pain. Sores in her mouth.   Rosuvastatin Calcium Other (See Comments)   Muscle aches   Statins Other (See Comments)   Muscle aches   Valsartan Other (See Comments)   Reaction not recalled   Strawberry Extract Itching, Rash      Medication List    STOP taking these medications   doxycycline 100 MG tablet Commonly known as: VIBRA-TABS   Gamunex-C 5 GM/50ML Soln Generic drug: Immune Globulin (Human)   Glycopyrrolate-Formoterol 9-4.8 MCG/ACT Aero Commonly known as: Bevespi Aerosphere   mycophenolate 250 MG capsule Commonly known as: CELLCEPT     TAKE these medications   acetaminophen 325  MG tablet Commonly known as: TYLENOL Take 325 mg by mouth See admin instructions. Take 325 mg by mouth one hour prior to Gamunex infusion (every 21 days)   albuterol 108 (90 Base) MCG/ACT inhaler Commonly known as: ProAir HFA Inhale 1-2 puffs into the lungs every 4 (four) hours as needed for wheezing or shortness of breath. What changed: Another medication with the same name was removed. Continue taking this medication, and follow the directions you see here.   azaTHIOprine 50 MG tablet Commonly known as: IMURAN Take 75 mg by mouth 2 (two) times daily. What changed: Another medication with the same name was removed. Continue taking this medication, and follow the directions you see here.   buPROPion 200  MG 12 hr tablet Commonly known as: WELLBUTRIN SR Take 200 mg by mouth 2 (two) times daily after a meal.   calcium carbonate 500 MG chewable tablet Commonly known as: TUMS - dosed in mg elemental calcium Chew 1 tablet (200 mg of elemental calcium total) by mouth daily.   Citrucel oral powder Generic drug: methylcellulose Take 1 packet by mouth daily as needed (for moderate constipation).   clonazePAM 1 MG disintegrating tablet Commonly known as: KLONOPIN Take 1 mg by mouth at bedtime. What changed: Another medication with the same name was removed. Continue taking this medication, and follow the directions you see here.   dexlansoprazole 60 MG capsule Commonly known as: DEXILANT Take 60 mg by mouth daily.   meclizine 12.5 MG tablet Commonly known as: ANTIVERT Take 1 tablet (12.5 mg total) by mouth 3 (three) times daily as needed for dizziness.   montelukast 10 MG tablet Commonly known as: SINGULAIR Take by mouth.   pyridostigmine 60 MG tablet Commonly known as: MESTINON TAKE 1 TABLET BY MOUTH 3 TIMES A DAY. What changed: when to take this   trimethoprim 100 MG tablet Commonly known as: TRIMPEX Take 100 mg by mouth daily.      CONSULTS:  Neurology was consulted.     PROCEDURES PERFORMED:  Dg Chest 2 View  Result Date: 11/27/2018 CLINICAL DATA:  Cough, SOB. Hx of diabetes, COPD, chronic bronchitis. Former [phone]). EXAM: CHEST - 2 VIEW COMPARISON:  01/14/2018 FINDINGS: Cardiac silhouette is normal in size. No mediastinal or hilar masses or evidence of adenopathy. Lungs are hyperexpanded, but clear. No pleural effusion or pneumothorax. Skeletal structures are intact. IMPRESSION: No active cardiopulmonary disease. Electronically Signed   By: Lajean Manes M.D.   On: 11/27/2018 14:47   Ir Fluoro Guide Cv Line Right  Result Date: 11/28/2018 INDICATION: 83 year old female with a history of myasthenia gravis referred for plasmapheresis catheter placement EXAM: TUNNELED  CENTRAL VENOUS PLASMAPHERESIS/HEMODIALYSIS CATHETER PLACEMENT WITH ULTRASOUND AND FLUOROSCOPIC GUIDANCE MEDICATIONS: 2 g Ancef. The antibiotic was given in an appropriate time interval prior to skin puncture. ANESTHESIA/SEDATION: Moderate (conscious) sedation was employed during this procedure. A total of Versed 0.5 mg and Fentanyl 50 mcg was administered intravenously. Moderate Sedation Time: 14 minutes. The patient's level of consciousness and vital signs were monitored continuously by radiology nursing throughout the procedure under my direct supervision. FLUOROSCOPY TIME:  Fluoroscopy Time: 0 minutes 12 seconds (3 mGy). COMPLICATIONS: None PROCEDURE: Informed written consent was obtained from the patient after a discussion of the risks, benefits, and alternatives to treatment. Questions regarding the procedure were encouraged and answered. The right neck and chest were prepped with chlorhexidine in a sterile fashion, and a sterile drape was applied covering the operative field. Maximum barrier sterile technique with sterile gowns  and gloves were used for the procedure. A timeout was performed prior to the initiation of the procedure. After creating a small venotomy incision, a micropuncture kit was utilized to access the right internal jugular vein under direct, real-time ultrasound guidance after the overlying soft tissues were anesthetized with 1% lidocaine with epinephrine. Ultrasound image documentation was performed. The microwire was marked to measure appropriate internal catheter length. External tunneled length was estimated. A total tip to cuff length of 19 cm was selected. Skin and subcutaneous tissues of chest wall below the clavicle were generously infiltrated with 1% lidocaine for local anesthesia. A small stab incision was made with 11 blade scalpel. The selected hemodialysis catheter was tunneled in a retrograde fashion from the anterior chest wall to the venotomy incision. A guidewire was  advanced to the level of the IVC and the micropuncture sheath was exchanged for a peel-away sheath. The catheter was then placed through the peel-away sheath with tips ultimately positioned within the superior aspect of the right atrium. Final catheter positioning was confirmed and documented with a spot radiographic image. The catheter aspirates and flushes normally. The catheter was flushed with appropriate volume heparin dwells. The catheter exit site was secured with a 0-Prolene retention suture. The venotomy incision was closed Derma bond and sterile dressing. Dressings were applied at the chest wall. Patient tolerated the procedure well and remained hemodynamically stable throughout. No complications were encountered and no significant blood loss encountered. IMPRESSION: Status post right IJ tunneled hemodialysis/plasmapheresis catheter placement. Signed, Dulcy Fanny. Dellia Nims, RPVI [facility] Radiology Electronically Signed   By: Corrie Mckusick D.O.   On: 11/28/2018 10:56   Ir Removal Tun Cv Cath W/o Fl  Result Date: 12/05/2018 INDICATION: Patient with history of myasthenia gravis requiring recent plasmapheresis catheter placement 11/28/2018. Patient has completed therapy and request now made for tunneled catheter removal. EXAM: REMOVAL TUNNELED CENTRAL VENOUS CATHETER MEDICATIONS: None ANESTHESIA/SEDATION: None FLUOROSCOPY TIME:  None COMPLICATIONS: None immediate. PROCEDURE: Informed written consent was obtained from the patient after a thorough discussion of the procedural risks, benefits and alternatives. All questions were addressed. Maximal Sterile Barrier Technique was utilized including caps, mask, sterile gowns, sterile gloves, sterile drape, hand hygiene and skin antiseptic. A timeout was performed prior to the initiation of the procedure. The patient's right chest and catheter was prepped and draped in a normal sterile fashion. Heparin was removed  from both ports of catheter. Using gentle blunt dissection the cuff of the catheter was exposed and the catheter was removed in its entirety. Pressure was held till hemostasis was obtained. A sterile dressing was applied. The patient tolerated the procedure well with no immediate complications. IMPRESSION: Successful catheter removal as described above. Read by: Brynda Greathouse PA-C Electronically Signed   By: Corrie Mckusick D.O.   On: 12/05/2018 14:33   Ir US Guide Vasc Access Right  Result Date: 11/28/2018 INDICATION: 83 year old female with a history of myasthenia gravis referred for plasmapheresis catheter placement EXAM: TUNNELED CENTRAL VENOUS PLASMAPHERESIS/HEMODIALYSIS CATHETER PLACEMENT WITH ULTRASOUND AND FLUOROSCOPIC GUIDANCE MEDICATIONS: 2 g Ancef. The antibiotic was given in an appropriate time interval prior to skin puncture. ANESTHESIA/SEDATION: Moderate (conscious) sedation was employed during this procedure. A total of Versed 0.5 mg and Fentanyl 50 mcg was administered intravenously. Moderate Sedation Time: 14 minutes. The patient's level of consciousness and vital signs were monitored continuously by radiology nursing throughout the procedure under my direct supervision. FLUOROSCOPY TIME:  Fluoroscopy Time: 0 minutes 12 seconds (3 mGy).  COMPLICATIONS: None PROCEDURE: Informed written consent was obtained from the patient after a discussion of the risks, benefits, and alternatives to treatment. Questions regarding the procedure were encouraged and answered. The right neck and chest were prepped with chlorhexidine in a sterile fashion, and a sterile drape was applied covering the operative field. Maximum barrier sterile technique with sterile gowns and gloves were used for the procedure. A timeout was performed prior to the initiation of the procedure. After creating a small venotomy incision, a micropuncture kit was utilized to access the right internal jugular vein under direct, real-time  ultrasound guidance after the overlying soft tissues were anesthetized with 1% lidocaine with epinephrine. Ultrasound image documentation was performed. The microwire was marked to measure appropriate internal catheter length. External tunneled length was estimated. A total tip to cuff length of 19 cm was selected. Skin and subcutaneous tissues of chest wall below the clavicle were generously infiltrated with 1% lidocaine for local anesthesia. A small stab incision was made with 11 blade scalpel. The selected hemodialysis catheter was tunneled in a retrograde fashion from the anterior chest wall to the venotomy incision. A guidewire was advanced to the level of the IVC and the micropuncture sheath was exchanged for a peel-away sheath. The catheter was then placed through the peel-away sheath with tips ultimately positioned within the superior aspect of the right atrium. Final catheter positioning was confirmed and documented with a spot radiographic image. The catheter aspirates and flushes normally. The catheter was flushed with appropriate volume heparin dwells. The catheter exit site was secured with a 0-Prolene retention suture. The venotomy incision was closed Derma bond and sterile dressing. Dressings were applied at the chest wall. Patient tolerated the procedure well and remained hemodynamically stable throughout. No complications were encountered and no significant blood loss encountered. IMPRESSION: Status post right IJ tunneled hemodialysis/plasmapheresis catheter placement. Signed, Dulcy Fanny. Dellia Nims, RPVI [facility] Radiology Electronically Signed   By: Corrie Mckusick D.O.   On: 11/28/2018 10:56       ADMISSION DATA: H&P:  Erica Whittinghill Woltzis a 83 y.o.femalewith medical history significant ofmyasthenia gravis, breast cancer, interstitial cystitis, fibromyalgia, presents to the emergency department due to worsening of weakness since couple of  months. Patient has myasthenia gravis and she is on IVIGevery 3 weeks. Patient reports since last few weeks she is more weak, has neck pain, double vision, low energy, drooping of face and wheezing. As per daughter at bedside reports jaw tightness and difficulty swallowing.  Her current regime for myasthenia gravis: IVIG every 3 weeks, azathioprine 75 mg p.o. twice daily and Mestinon 60 mg 3 times daily. She is compliant with her medication and she took all of her home medicines this morning. She recently evaluated by her pulmonologist and placed her on 2 L of oxygen via nasal cannulae at bedtime due to low oxygen saturation.  Due to her worsening weakness she was told by her neurologist Dr. Posey Pronto to go to the emergency department for plasma exchange.  Patient reports dysuria, lower abdominal pain, hematuria since couple of days. She has history of chronic interstitial cystitis. She denies fever, chills, cough, congestion, runny nose, sore throat, recent COVID-19 exposure, headache, lightheadedness, dizziness, nausea, vomiting, diarrhea, decreased appetite, weight loss.  She lives alone at independent living facility and she is independent on her daily life activities. She denies smoking, alcohol, illicit drug use. She is compliant with her IVIG appointments.  ED Course:Patient is afebrile, no leukocytosis, neurology consulted by  EDP who recommended plasmapheresis. Chest x-ray is negative.COVID test negative.  The patient was admitted to a step down bed. Her NIF is being monitored by respiratory therapy. Neurology is consulted. She has had a Rt IJ tunnelled catheter placed for immediate use. She will be receiving plasma exchange to treat her myasthenia gravis exacerbation. She is on IVIG as outpatient. She states that lately it has not been helping. She is also found to have a UTI, and is being treated with IV ancef. Urine culture is pending.   Physical Exam:  General: Not in  acute distress HEENT: PERRL, EOMI, no scleral icterus, No JVD or bruit Cardiac: S1/S2, RRR, No murmurs, gallops or rubs Pulm: Clear to auscultation bilaterally. No rales, wheezing, rhonchi or rubs. Abd: Soft, nondistended, nontender, no rebound pain, no organomegaly, BS present Ext: No edema. 2+DP/PT pulse bilaterally Musculoskeletal: No joint deformities, erythema, or stiffness, ROM full Skin: No rashes.  Neuro: Alert and oriented X3, cranial nerves II-XII grossly intact, muscle strength 4/5 in all extremeties. Psych: Patient is not psychotic, no suicidal or hemocidal ideation.   Labs:   HOSPITAL COURSE:  Myasthenia gravis with exacerbation: Patient has history ofseropositivemyasthenia gravis and she is on IVIGevery 3 weeks, azathioprine 75 mg p.o. twice daily and Mestinon 60 mg 3 times daily. Presented with worsening of weakness. She states that she doesn't think that the IVIG has been helping for some time. Neurology has been consulted. She has had a Right IJ tunneled catheter placed for plasma exchange. She received 5 treatments of plasma exchange  She has been getting NIF and VC checks q shift, but only one has been done. Today her NIF at (-32) and VC of 1.5L. Oxygen saturations have remained stable. No SOB or CP. She feels much better.  Per neurologist, Dr. Rory Percy, patient is stable enough to be discharged to previous assisted living facility. Will continue home azathioprine and Mestinon. She will need PT per PT's recommendations.  Urinary tract infection: Patient ihas been started on ceftriaxone this was changed for Ancef. Patient is afebrile, no leukocytosis. UA is positive for leukocytes,patient is complaining of dysuria, hematuria, lower abdominal pain. Urine culture is pending. She has been restarted on pyridium and Klonopine for her interstitial cystitis. Antibiotics have been discontinued on 10/7/21020.  Diarrhea: Low suspicion for infectious cause. Imodium given. Resolved  today.  AoCKD-III: Resolved. Avoid nephrotoxic medication and hypotension. Monitor creatinine, electrolytes, and volume status.  COPD: Not in acute exacerbation. Patient is maintaining oxygen saturation on room air. On continuous pulse ox. We will continue home inhalers.  Anxiety: Stable. Will avoid use of anxiolytics at this time due to low NIF and VC. Continue Wellbutrin. Clonazepam will be continued.    DISCHARGE DATA: Vital Signs: BP (!) 116/51    Pulse 91    Temp 98.5 F (36.9 C)    Resp 15    Ht _0  (1.6 m)    Wt 65.8 kg    SpO2 96%    BMI 25.70 kg/m   Labs: No results found for this or any previous visit (from the past 24 hour(s)).   Services Ordered on Discharge: Y = Yes; Blank = No PT: yes  OT:   RN:   Equipment:   Other:      Time Spent on Discharge: 36 min   Signed: Ivor Costa, MD PGY 3, Internal Medicine Resident 12/12/2018, 8:04 AM

## 2018-12-05 NOTE — Progress Notes (Addendum)
NEUROLOGY PROGRESS NOTE  Subjective: Patient seen and examined in dialysis, NAD, awake, alert. Final plex treatment complete. Patient ready to go home.   Exam: Vitals:   12/05/18 1207 12/05/18 1212  BP: (!) 117/52 (!) 116/51  Pulse: 95 91  Resp: 14 15  Temp: 98.5 F (36.9 C) 98.5 F (36.9 C)  SpO2:      Physical Exam  HEENT-  Normocephalic, no lesions, without obvious abnormality.  Normal external eye and conjunctiva.   Musculoskeletal-no joint tenderness, deformity or swelling Skin-warm and dry, no hyperpigmentation, vitiligo, or suspicious lesions  Neuro:  AAOx3 No dysarthria No aphasia Cranial nerves: Pupils equal round react light, extraocular movements intact, no ptosis, no diplopia on extended upward gaze, face symmetric, tongue and palate midline. Motor exam: Upper extremities 4+/5 without fatigability.  Lower extremities 4+/5 with some fatigability.  Neck flexor and extensor also 4+/5. Sensory exam: Intact to light touch all over   Medications:  Scheduled: . azaTHIOprine  75 mg Oral BID  . calcium carbonate  2 tablet Oral Q3H  . clonazePAM  1 mg Oral BID  . loperamide  4 mg Oral Daily  . mouth rinse  15 mL Mouth Rinse BID  . pyridostigmine  60 mg Oral TID PC  . trimethoprim  100 mg Oral Daily  . Wellbutrin SR tablets  200 mg Oral BID    Pertinent Labs/Diagnostics: none NIF -37 VC 1.4L   Erica Morale, MSN, NP-C [facility] [phone]  Attending addendum Patient seen and examined Discussed with Dr. Arvin Collard Patel-outpatient neuromuscular specialist.  Assessment: Patient with myasthenia gravis and worsening symptoms including falls/weakness and at times diplopia.   Some subjective improvement after plasma exchange. Objectively not worse and may be slightly better than initial exam. Completed plasma exchange today.  Recommendations: -- remove plasma exchange catheter after last PLEX treatment,  --Continue home pyridostigmine and  azathioprine doses. -- okay to discharge home. --f/u with outpatient neurologist in 2 to 4 weeks. -- Neurology to sign off at this time, please call with any further questions or concerns.  --Plan discussed with Dr. Mora Bellman on the floor and Dr. Posey Pronto over the phone  12/05/2018, 1:07 PM   -- Amie Portland, MD [facility] Pager: [phone] If 7pm to 7am, please call on call as listed on AMION.

## 2018-12-05 NOTE — Progress Notes (Signed)
Occupational Therapy Treatment Patient Details Name: Erica Drake MRN: [id number] DOB: [birth year] Today's Date: 12/05/2018    History of present illness Pt is an 83 yo female admitted for myasthenia gravis exacerbation and in hospital for IV plasmaphoresis. PMH: breast cancer, fibromyalgia, breast CA, interstitial cystitis, vertigo.   OT comments  Pt returning from interventional radiology upon OT arrival, stating she "doesn't want to move because she just got her catheter out." Session focus on EC strategies to carryover at home. Pt able to verbalize 2 strategies to use at home to facilitate increased functional independence. Education on uses of reacher to assist with energy conservation at home. Pt verbalize understanding. Pt to be Dc'ed back to [place] today where she will likely continue to progress. Will continue to follow acutely for OT needs.   Follow Up Recommendations  Home health OT    Equipment Recommendations  None recommended by OT    Recommendations for Other Services      Precautions / Restrictions Precautions Precautions: Fall Precaution Comments: per pt and daughter h/o stumbles Restrictions Weight Bearing Restrictions: No       Mobility Bed Mobility               General bed mobility comments: pt declining bed mobility d/t pain in shoulder from catheter removal  Transfers                 General transfer comment: declined OOB transfer    Balance                                           ADL either performed or assessed with clinical judgement   ADL Overall ADL's : Needs assistance/impaired       Grooming Details (indicate cue type and reason): declined OOB ADL               Lower Body Dressing Details (indicate cue type and reason): issued reacher for LB dressing and to assist with IADLs   Toilet Transfer Details (indicate cue type and reason): declined OOB transfer           General ADL Comments:  declined ADL participation; session focus on DME needs, EC strategies and LB AE     Vision   Vision Assessment?: No apparent visual deficits   Perception     Praxis      Cognition Arousal/Alertness: Awake/alert Behavior During Therapy: WFL for tasks assessed/performed Overall Cognitive Status: Within Functional Limits for tasks assessed                                          Exercises     Shoulder Instructions       General Comments      Pertinent Vitals/ Pain       Pain Assessment: Faces Faces Pain Scale: Hurts a little bit Pain Location: R shoulder from catheter removal Pain Descriptors / Indicators: Discomfort;Grimacing;Sore Pain Intervention(s): Monitored during session  Home Living                                          Prior Functioning/Environment  Frequency  Min 2X/week        Progress Toward Goals  OT Goals(current goals can now be found in the care plan section)  Progress towards OT goals: Progressing toward goals  Acute Rehab OT Goals Patient Stated Goal: to get back to dog Posey OT Goal Formulation: With patient Time For Goal Achievement: 12/12/18 Potential to Achieve Goals: Good  Plan Discharge plan remains appropriate    Co-evaluation                 AM-PAC OT "6 Clicks" Daily Activity     Outcome Measure   Help from another person eating meals?: None Help from another person taking care of personal grooming?: None Help from another person toileting, which includes using toliet, bedpan, or urinal?: None Help from another person bathing (including washing, rinsing, drying)?: None Help from another person to put on and taking off regular upper body clothing?: None Help from another person to put on and taking off regular lower body clothing?: None 6 Click Score: 24    End of Session    OT Visit Diagnosis: Unsteadiness on feet (R26.81);Muscle weakness (generalized)  (M62.81)   Activity Tolerance Patient tolerated treatment well   Patient Left in bed;with call bell/phone within reach;with family/visitor present   Nurse Communication Mobility status;Other (comment)(wants IV out)        Time: [id number] OT Time Calculation (min): 17 min  Charges: OT General Charges $OT Visit: 1 Visit OT Treatments $Self Care/Home Management : 8-22 mins  [place], [place] [facility] [phone] [place] 12/05/2018, 2:43 PM

## 2018-12-05 NOTE — Progress Notes (Signed)
OT Cancellation Note  Patient Details Name: Erica Drake MRN: [id number] DOB: 29-Apr-1935   Cancelled Treatment:    Reason Eval/Treat Not Completed: Patient at procedure or test/ unavailable. Will check back as time allows.   [place], COTA/L Acute Rehabilitation Services [phone] [place] 12/05/2018, 2:11 PM

## 2018-12-05 NOTE — Discharge Instructions (Signed)
You were cared for by a hospitalist during your hospital stay. If you have any questions about your discharge medications or the care you received while you were in the hospital after you are discharged, you can call the unit and ask to speak with the hospitalist on call if the hospitalist that took care of you is not available. Once you are discharged, your primary care physician will handle any further medical issues. Please note that NO REFILLS for any discharge medications will be authorized once you are discharged, as it is imperative that you return to your primary care physician (or establish a relationship with a primary care physician if you do not have one) for your aftercare needs so that they can reassess your need for medications and monitor your lab values.  Follow up with PCP and your Neurologist, Dr. Oda Kilts. Take all medications as prescribed. If symptoms change or worsen please return to the ED for evaluation

## 2018-12-05 NOTE — Consult Note (Signed)
   [facility] CM Inpatient Consult   12/05/2018  Erica Drake 1935/11/13 [id number]    Patient's chartreviewedfor 16%risk score forunplanned readmission andhospitalization under her Medicare/ Next[employer] benefit.  Patient's chartreviewedandrevealedas follows: Due to patient's worsening weakness she was told by her neurologist Dr. Posey Pronto to go to the emergency department for plasma exchange to treat her myasthenia gravis exacerbation (UTI). She lives alone at the independent living facility [facility]) and she is independent on her daily life activities. She is compliant with her IVIG appointments.  Attempts to call patient in her hospital room phone but no response. Called and spoke to patient's RN who states that therapy is working with patient at the moment, and also confirmed that patient will be discharging soon.  Transition of care SW note review states that patient is agreeable to discharge plan of returning back to her apartment Environmental education officer) with home health PT per PT recommendation.   RN called back and was connected to patient. Able to speak with patient and daughter (Erica Drake) via phone. HIPAA verified.   Patient was excited to report that she is discharging home today with home health care through [facility]. She mentioned having home oxygen used at night. Discussed [facility] care management services with daughter and patient, however, patient indicated nothaving current needsor barrierswith medications (self managed with "pillbox"), pharmacy ([facility]) and transportation/ assistance being provided by the facility when needed. Her daughter from [place] is here to assist with her needs post discharge.  Daughter took [facility] information and contact number in case needs would arise.  Both are made aware of need to follow-up with primary care provider post hospitalization.  Patient endorsesDr. Berneta Sages with [facility] as her primary care provider, listed  to provide transition of care.  Patient will be followed withEMMIGeneral calls post discharge- patient and daughter aware.  Of note, [facility] Care Management services does not replace or interfere with any services that are arranged by transition of care case management or social work.    For questions and additional information, please contact:  Erica Drake, BSN, RN-BC [facility] Liaison Cell: [phone]

## 2018-12-08 ENCOUNTER — Other Ambulatory Visit: Payer: Self-pay

## 2018-12-08 ENCOUNTER — Encounter: Payer: Self-pay | Admitting: Neurology

## 2018-12-08 ENCOUNTER — Ambulatory Visit (INDEPENDENT_AMBULATORY_CARE_PROVIDER_SITE_OTHER): Payer: Medicare Other | Admitting: Neurology

## 2018-12-08 VITALS — BP 120/70 | HR 67 | Ht 63.0 in | Wt 141.0 lb

## 2018-12-08 DIAGNOSIS — G7001 Myasthenia gravis with (acute) exacerbation: Secondary | ICD-10-CM | POA: Diagnosis not present

## 2018-12-08 NOTE — Patient Instructions (Signed)
Return to clinic in 1 month

## 2018-12-08 NOTE — Progress Notes (Signed)
Follow-up Visit   Date: 12/08/18   Erica Drake MRN: [id number] DOB: 1935/03/23   Interim History: Erica Drake is a 83 y.o. female returning to the clinic for follow-up of seropositive myasthenia gravis (2017).  The patient was accompanied to the clinic by daughter, Erica Drake.  She [facility] - 12/05/2018 for MG exacerbation and underwent 5 treatment cycles of plasmapheresis.  She felt stronger after her first session, but did not appreciate much improvement with subsequent cycles of plasmapheresis.  Her daughter feels that she has become much stronger.  Currently, she denies any double vision droopy eyelids, difficulty swallowing, and weakness.  She continues to have mild weakness in the legs and walks with a cane.  She has not suffered any falls.  She is currently on azathioprine 75 mg twice daily and Mestinon 60 mg 3 times daily.  She was on IVIG every 3 weeks prior to her hospitalization.  She has not been able to tolerate steroids in the past.  Her daughter would like for patient to move to [place], where will be easier for her to care for her mother and provide social support.  She will arrange for private charter flight for travel to limit social contact and is exploring assisted living facilities close to her home.  Medications:  Current Outpatient Medications on File Prior to Visit  Medication Sig Dispense Refill  . acetaminophen (TYLENOL) 325 MG tablet Take 325 mg by mouth See admin instructions. Take 325 mg by mouth one hour prior to Gamunex infusion (every 21 days)    . albuterol (PROAIR HFA) 108 (90 Base) MCG/ACT inhaler Inhale 1-2 puffs into the lungs every 4 (four) hours as needed for wheezing or shortness of breath. 1 Inhaler 12  . azaTHIOprine (IMURAN) 50 MG tablet Take 75 mg by mouth 2 (two) times daily.    Marland Kitchen buPROPion (WELLBUTRIN SR) 200 MG 12 hr tablet Take 200 mg by mouth 2 (two) times daily after a meal.     . calcium carbonate  (TUMS - DOSED IN MG ELEMENTAL CALCIUM) 500 MG chewable tablet Chew 1 tablet (200 mg of elemental calcium total) by mouth daily. 30 tablet 1  . clonazePAM (KLONOPIN) 1 MG disintegrating tablet Take 1 mg by mouth at bedtime.     Marland Kitchen dexlansoprazole (DEXILANT) 60 MG capsule Take 60 mg by mouth daily.    . diphenhydrAMINE (BENADRYL) 25 MG tablet Take 25 mg by mouth See admin instructions. Take 25 mg by mouth one hour prior to Gamunex infusion    . meclizine (ANTIVERT) 12.5 MG tablet Take 1 tablet (12.5 mg total) by mouth 3 (three) times daily as needed for dizziness. 30 tablet 3  . methylcellulose (CITRUCEL) oral powder Take 1 packet by mouth daily as needed (for moderate constipation).     . montelukast (SINGULAIR) 10 MG tablet Take by mouth.    . NONFORMULARY OR COMPOUNDED ITEM Take 200 mg by mouth 2 (two) times daily. 30 each 1  . pyridostigmine (MESTINON) 60 MG tablet TAKE 1 TABLET BY MOUTH 3 TIMES A DAY. (Patient taking differently: Take 60 mg by mouth 3 (three) times daily with meals. ) 270 tablet 0  . trimethoprim (TRIMPEX) 100 MG tablet Take 100 mg by mouth daily.  2   No current facility-administered medications on file prior to visit.     Allergies:  Allergies  Allergen Reactions  . Cortisone Shortness Of Breath, Swelling and Other (See Comments)  Tongue swelling, Injection into knees and hips ave not been issue 2020.    . Sulfa Antibiotics Other (See Comments)    May possibly have caused deafness in one ear  . Adhesive [Tape] Itching and Other (See Comments)    Turns the skin red where applied  . Amlodipine Besylate Cough  . Cellcept [Mycophenolate] Other (See Comments)    Caused severe stomach cramping  . Chlorhexidine Gluconate Itching and Other (See Comments)    Skin on face turned red, also   . Clindamycin Itching  . Dexamethasone Other (See Comments)    Per MD  . Duloxetine Other (See Comments)    Reaction not recalled  . Morphine Sulfate Other (See Comments)     Reaction not recalled  . Niacin Other (See Comments)    Reaction not recalled  . Other Other (See Comments)    Pollen - nasal reaction/congestion  . Prednisone Other (See Comments)    Causes stomach pain. Sores in her mouth.   . Rosuvastatin Calcium Other (See Comments)    Muscle aches  . Statins Other (See Comments)    Muscle aches  . Valsartan Other (See Comments)    Reaction not recalled  . Strawberry Extract Itching and Rash    Review of Systems:  CONSTITUTIONAL: No fevers, chills, night sweats, or weight loss.  EYES: No visual changes or eye pain ENT: No hearing changes.  No history of nose bleeds.   RESPIRATORY: No cough, wheezing and shortness of breath.   CARDIOVASCULAR: Negative for chest pain, and palpitations.   GI: Negative for abdominal discomfort, blood in stools or black stools.  No recent change in bowel habits.   GU:  No history of incontinence.   MUSCLOSKELETAL: +history of joint pain or swelling.  No myalgias.   SKIN: Negative for lesions, rash, and itching.   ENDOCRINE: Negative for cold or heat intolerance, polydipsia or goiter.   PSYCH:  + depression or anxiety symptoms.   NEURO: As Above.   Vital Signs:  BP 120/70   Pulse 67   Ht 5\' 3"  (1.6 m)   Wt 141 lb (64 kg)   SpO2 97%   BMI 24.98 kg/m    General Medical Exam:   General:  Well appearing, comfortable  Eyes/ENT: see cranial nerve examination.   Neck:   No carotid bruits. Respiratory:  Clear to auscultation, good air entry bilaterally.   Cardiac:  Regular rate and rhythm, no murmur.   Ext:  No edema  Neurological Exam: MENTAL STATUS including orientation to time, place, person, recent and remote memory, attention span and concentration, language, and fund of knowledge is normal.  Speech is not dysarthric.  CRANIAL NERVES:  Pupils equal round and reactive to light.  Normal conjugate, extra-ocular eye movements in all directions of gaze.  Subtle left ptosis at baseline, with no worsening with  sustained upgaze.  Face is symmetric. Palate elevates symmetrically.  Tongue is midline, strength is 5-/5.  Orbicularis oris, orbicularis oculi is 5/5.    MOTOR:  Motor strength is 5/5 in the upper extremities and 5-/5 in the lower extremities.  Neck flexion is 5-/5 (improved)  COORDINATION/GAIT:   She is unable to stand up without using arms to push off.  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   She has completed plasmapheresis x 5 cycles in early October 2020 and responded fairly well. She continues to have mild weakness with neck flexion  and bilateral hip flexion.  Exam is markedly improved from 9/25 when I evaluated her in the office. Continue azathioprine 150mg  daily and mestinon 60mg  TID. Restart IVIG 1g every 3 weeks, if she developed worsening symptoms  From a neurological stand point, she may travel in her current condition.  She will need wheelchair for transport.  If she has plans to move to [place], she will need to establish care with neuromuscular specialist for ongoing follow-up and management.  I have encouraged her to consider [facility] as long-term therapy, which she remains undecided on.  I recommend seeking her PCP and pulmonologist's advice on whether she is fit fly to [place] than 50% of this 25 minute visit was spent in counseling, explanation of diagnosis, planning of further management, and coordination of care.   Thank you for allowing me to participate in patient's care.  If I can answer any additional questions, I would be pleased to do so.    Sincerely,    Caio Devera K. Posey Pronto, DO

## 2018-12-09 NOTE — Telephone Encounter
Outside records uploaded in [facility] under the notes tab, 12/08/2018.  Sent to MD for further review.

## 2018-12-10 NOTE — Telephone Encounter
PT SCHEDULED

## 2018-12-10 NOTE — Telephone Encounter
Call Back Request    MD:  Dr. Jefm Bryant     Reason for call back: Pt's daughter, [place], called to f/u on request to schedule. [place] did clarify she would like to have pt seen in Dr. Ozzie Hoyle M.G clinic. Refer to notes in referral. Please assist at earliest convenience. Thank you.     Any Symptoms:  []  Yes  [x]  No      ? If yes, what symptoms are you experiencing:    o Duration of symptoms (how long):    o Have you taken medication for symptoms (OTC or Rx):      Patient or caller has been notified of the 24-48 hour turnaround time.

## 2018-12-11 ENCOUNTER — Encounter: Payer: Self-pay | Admitting: *Deleted

## 2018-12-11 ENCOUNTER — Telehealth: Payer: Self-pay

## 2018-12-11 ENCOUNTER — Other Ambulatory Visit: Payer: Self-pay | Admitting: *Deleted

## 2018-12-11 DIAGNOSIS — N1832 Chronic kidney disease, stage 3b: Secondary | ICD-10-CM | POA: Diagnosis not present

## 2018-12-11 DIAGNOSIS — J42 Unspecified chronic bronchitis: Secondary | ICD-10-CM | POA: Diagnosis not present

## 2018-12-11 DIAGNOSIS — K219 Gastro-esophageal reflux disease without esophagitis: Secondary | ICD-10-CM | POA: Diagnosis not present

## 2018-12-11 DIAGNOSIS — N3010 Interstitial cystitis (chronic) without hematuria: Secondary | ICD-10-CM | POA: Diagnosis not present

## 2018-12-11 DIAGNOSIS — F341 Dysthymic disorder: Secondary | ICD-10-CM | POA: Diagnosis not present

## 2018-12-11 DIAGNOSIS — I129 Hypertensive chronic kidney disease with stage 1 through stage 4 chronic kidney disease, or unspecified chronic kidney disease: Secondary | ICD-10-CM | POA: Diagnosis not present

## 2018-12-11 DIAGNOSIS — R42 Dizziness and giddiness: Secondary | ICD-10-CM | POA: Diagnosis not present

## 2018-12-11 DIAGNOSIS — G7000 Myasthenia gravis without (acute) exacerbation: Secondary | ICD-10-CM | POA: Diagnosis not present

## 2018-12-11 DIAGNOSIS — J309 Allergic rhinitis, unspecified: Secondary | ICD-10-CM | POA: Diagnosis not present

## 2018-12-11 NOTE — Telephone Encounter (Signed)
ATC patient unable to reach LM to call back office (x1) both numbers called one left message on one rang busy

## 2018-12-11 NOTE — Telephone Encounter (Signed)
Sent email to Erica Drake to let her know about the infusions being on hold

## 2018-12-11 NOTE — Telephone Encounter (Signed)
Dr.Patel can you give me some insight on this?

## 2018-12-11 NOTE — Telephone Encounter (Signed)
[facility] called and left a message with Access Nurse at 12:40 PM on 12/11/18.   They are requesting a call back about the patient having not received any infusions since September.

## 2018-12-11 NOTE — Telephone Encounter (Signed)
Please inform [facility] that I am going to put the IVIG on hold for now.  She was recently hospitalized and has PLEX.  Thanks.

## 2018-12-11 NOTE — Telephone Encounter (Signed)
Pt returning call

## 2018-12-11 NOTE — Addendum Note (Signed)
Addended by: Deloria Lair on: 12/11/2018 05:20 PM   Modules accepted: Orders

## 2018-12-11 NOTE — Patient Outreach (Addendum)
Telephone outreach to follow up on Emmi discharge call with [facility].  Spoke to pt briefly, she is actually in Dr. Danna Hefty office and she requested a return call later today.  Talked with pt again at length this afternoon. She is recovering well from her exacerbation of myasthenia gravis. Her daughter has been staying with her and she is planning on going back to [place] with he daughter for an undetermined about of time.  She went to see her primary care provider today and had her hospital follow up. She has new Rxs for her trip. She has arranged to have portable O2. She is going to call and advise her home health agency to stop services.  Reminded pt to also take her Advanced Directives and DNR forms.  I will not open her case but have advised her that she can call me if she would need anything back here at home and I will try to arrange the request.  Kayleen Memos C. Myrtie Neither, MSN, [facility] Gerontological Nurse Practitioner [facility] Care Management [phone]  Eulah Pont. Myrtie Neither, MSN, [facility] Gerontological Nurse Practitioner [facility] Care Management [phone]

## 2018-12-11 NOTE — Telephone Encounter (Signed)
ATC patient unable to reach LM to call back office (x1)  

## 2018-12-12 NOTE — Telephone Encounter (Signed)
Left message for patient to call back  

## 2018-12-16 NOTE — Telephone Encounter (Signed)
ATC pt, line went to voicemail. LMTCB x3. Multiple attempts have been made to reach this patient with no success. Per triage protocol, will close out this message. Nothing further needed at this time.

## 2018-12-16 NOTE — Progress Notes
OUTPATIENT GERIATRICS CLINIC NOTE    PATIENT:  [name]   MRN:  [id number]  DOB:  Sep 23, 1935  DATE OF SERVICE:  12/16/2018  PRIMARY CARE PHYSICIAN: York Ram., MD  CHIEF COMPLAINT:   No chief complaint on file.        HISTORY OF PRESENT ILLNESS     [name] is a 83 y.o. female who presents today for Geriatric Evaluation.              PMH      FH      SH      CHRONIC CONDITIONS       1.  []  Stable  []  Improved   []  Worse   2.  []  Stable  []  Improved   []  Worse   3.   []  Stable  []  Improved   []  Worse           MEDICATIONS     Personally reviewed.    No outpatient medications have been marked as taking for the 12/17/18 encounter (Appointment) with York Ram., MD.             ADVANCED CARE PLANNING     N    FUNCTIONAL STATUS     BADLs:  IADLs:  Ambulates with       HEALTH CARE MAINTENANCE     IMMUNIZATIONS:   There is no immunization history on file for this patient.      REVIEW OF SYSTEMS     []  Unreliable/unobtainable secondary to:  []  dementia []  altered mental status       ROS  (Check if normal, or note positive findings)   []  Constit  []  Skin    []  Eyes  []  CV  []  MS    []  ENMT  []  GI  []  Heme/Lymph    []  Resp  []  GU  []  Neuro    []  Immune/Allergy  []  Endo  []  Psych        PHYSICAL EXAM    (Check if Normal, or note positive findings)    Vitals There were no vitals taken for this visit.   Wt Readings from Last 3 Encounters:   No data found for Wt       Gen []  NAD     Eyes []  Conj/Lids  []  Pupils  []  Fundi  []   Sclerae non-icteric.     ENT []  Ears/Otoscopy  []  Nasal Mucosa []  Oropharynx  []  Hearing     Neck []  Inspect/Palpation  []  Thyroid   []   Supple     Breast/Chest []  Inspect  []  Palpation     Resp []  Effort  []  Auscultate  []  Percuss     CV []  Auscultate     []  Carotids        Pulses: [] Fem  []  Pedal        []  Edema     Lymph []  Cervical  []   Supraclavicular  []  Axillary   []  Groin     GI []  Abd  []  Organomegaly  []  Masses []  Guard/Rebnd Tend []  Rectal GU Female:  []  Ext  []  Vagina []  Cervix  []  Uterus/Adnexa    Female: []  Penis []  Scrotum []  Prostate     Neuro []  CN2-12  []  DTR/bbski   []  Motor/Sensory  []   Pronator drift     MS []  Gait []  Inspect/palpate []  Muscle Tone []  ROM  []  Balance []  Back     Skin []   Inspect  []  Palpate     Psych []  Insight/judgment  []  Affect  []  Cognition           LABS: personally reviewed            STUDIES: personally reviewed reports       ASSESSMENT and PLAN     [name] is a 83 y.o. female who presents today for follow up.            RTC in     Future Appointments   Date Time Provider Department Center   12/17/2018  2:20 PM York Ram., MD GERI IMS 420 MEDICINE   12/23/2018  9:00 AM NEU MG CLINIC [facility] NEUMUSC B200 NEUROLOGY       The above plan of care, diagnosis, orders, and follow-up were discussed with the patient and/or surrogate.  Questions related to this recommended plan of care were answered.    Author: York Ram, MD  12/16/2018           Annual Wellness Visit:     []  Initial Medicare Annual Wellness Visit   []  Subsequent Medicare Annual Wellness Visit - at least 12 months since last AWV     []  I reviewed patient's past medical history, family history and medications. I updated the electronic record.    []  I reviewed with patient what other consulting doctors are involved in the patient's care. See above.    Patient's Self Reported Health Status: []  poor []  fair []  good []  excellent  Hospitalizations in the past year:  []  none  []  More than 3  []   Less than 3  Polypharmacy (greater than 6 chronic medications):  []  yes []  no  More than 5 chronic medical conditions: []  yes []  no     Functional and Physical Assessment Cognitive & Social Assessments   Visual Acuity:    []  Regularly sees ophthalmologist    []  Recommend eye exam    Hearing Screen (whisper test):    []  Denies hearing complaints   []  Able to hear whisper or finger-rub at 6 feet.   []  Audiology referral placed   []  Hearing aides present Weight Loss: []  yes []  no    Get Up and Go Test: ***    Ambulatory Device:    []  none []  cane []   walker []   WC    Fall in past year: []  none    []  yes  Afraid of Falling: []  yes       []  no    Activities of Daily Living:   []  Independent in all ADLs and IADLs   []  Dependent in the following              []   Bathing    []  Groceries            []   Dressings []  Driving/bus            []   Toileting []  Telephone            []   Continence []  Chores            []   Grooming []  Cooking            []   Feeding []  Laundry            []   Transfering []  Medications    []  Finances    PHQ-9: ***  Mini-Cog: ***    Living:      []  home   []   Assisted Living        []  lives alone         []  lives with family      []  has a caregiver  []  caregiver not needed      []  caregiver support is sufficient       []  Provides care for another person    []  retired    []  still working  []  wears a seatbelt  []  no current tobacco use  []  tobacco cessation counseling provided  []  no current alcohol use    []  acceptable amount of alcohol use. Counseled on moderation  []  alcohol cessation counseling provided  []  no current drug use  []  exercises & discussed recommendations  []  unable to exercise given functional limitations  []  Discussed home safety: rugs, bathroom safety (grab-bars, non-slip pads), handrails for stairs, clutter, slippery surfaces, and safe lighting  []  Diet reviewed and recommendations provided on healthy eating    []  I reviewed patient's social history (see above and note for additional details)     Advanced directive:    Yes []   No []     Reviewed - see below []     Discussed []  Materials given[]        # Healthcare Maintenance   []  Refer for mammogram   []  Refer for DXA   []  PAP smear today   []  FIT testing ordered today   []  Refer for colonoscopy   []  Check hepatitis C antibody   []  Refer for bone density test   []  Check PSA    []  Vaccinations ordered today: []  Influenza   []  prevnar-13 []  pneumovax-23 []  shingrix 1  []  shingrix 2   []  hepatitis B   []  Tdap/Td        Advanced Care Planning/goals of care discussion.  I reviewed with [name]/patient's surrogates today regarding his/her goals of care and advanced care planning on a voluntary basis for the patient/patient's surrogates.      Time spent: []  Initial 30 minutes. []  longer than initial 30 minutes    Topics discussed today:   []  Medical conditions  []  Prognosis  []  Goals of care  []  Designation of surrogate medical decision-maker  []  Completion of advanced directives  []  Completion of POLST.     Part A:  []  Attempt resuscitation/CPR  []   Do not attempt resuscitation   Part B:  []  Full treatment  []  Selective treatment  []  Comfort-focused treatment   Part C: []  Long-term artificial nutrition  []  Trial of artificial nutrition []  No artificial nutrition    Details as follows: ***        Advice/Referrals:   Subsequent annual wellness visit--at least 12 months since last annual wellness visit.       The additional diagnoses assessed at this visit listed below constituted a separate, identifiable service from the routine preventive health examination.       25956 (30-50 minutes)- non face to face prolonged service: I spent an additional {Blank:19197::''30+ minutes before'',''30+ minutes after''} today's visit on 12/16/2018 reviewing pt's {Blank:19197::''[facility] records'',''outside [place] records''}, which were relevant and pertinent to today's visit. This review was not within the scope of my clinical staff, and included review of the following:   []  Progress notes from PCP  []  Progress notes from sub-specialists  []  Hospitalizations, including discharge summaries  []  Pertinent laboratory results  []  Pertinent imaging test results  []  Prior EKG's  []  Pertinent procedures   The review of the records  mentioned above were incorporated into my assessment and plan with the pt's conditions, including:  []  Cognitive impairment  []  Weight loss  []  Mood disorder []  Sleep disorder   []  Chronic cardiac and renal diseases

## 2018-12-17 ENCOUNTER — Ambulatory Visit: Payer: BLUE CROSS/BLUE SHIELD | Attending: Geriatric Medicine

## 2018-12-17 ENCOUNTER — Ambulatory Visit: Payer: MEDICARE

## 2018-12-17 ENCOUNTER — Inpatient Hospital Stay: Payer: MEDICARE | Attending: Geriatric Medicine

## 2018-12-17 ENCOUNTER — Telehealth: Payer: MEDICARE

## 2018-12-17 DIAGNOSIS — R1012 Left upper quadrant pain: Secondary | ICD-10-CM

## 2018-12-17 DIAGNOSIS — G7000 Myasthenia gravis without (acute) exacerbation: Secondary | ICD-10-CM

## 2018-12-17 DIAGNOSIS — R141 Gas pain: Secondary | ICD-10-CM | POA: Diagnosis not present

## 2018-12-17 MED ORDER — DEXLANSOPRAZOLE 60 MG PO CPDR
60 mg | ORAL_CAPSULE | Freq: Every day | ORAL | 3 refills | Status: AC
Start: 2018-12-17 — End: ?

## 2018-12-17 MED ORDER — MECLIZINE HCL 12.5 MG PO TABS
12.5 mg | ORAL_TABLET | Freq: Three times a day (TID) | ORAL | 3 refills | Status: AC | PRN
Start: 2018-12-17 — End: ?

## 2018-12-17 NOTE — Telephone Encounter
Called l/m regarding checkout 6m f/u

## 2018-12-18 LAB — TSH with reflex FT4, FT3: TSH: 1 u[IU]/mL (ref 0.3–4.7)

## 2018-12-18 LAB — Comprehensive Metabolic Panel
ALBUMIN: 4.3 g/dL (ref 3.9–5.0)
CHLORIDE: 101 mmol/L (ref 96–106)
GLUCOSE: 93 mg/dL (ref 65–99)

## 2018-12-18 LAB — Differential Automated: ABSOLUTE BASO COUNT: 0.05 10*3/uL (ref 0.00–0.10)

## 2018-12-18 LAB — CBC: NUCLEATED RBC%, AUTOMATED: 0 (ref 143–398)

## 2018-12-18 LAB — UA,Dipstick: SPECIFIC GRAVITY: 1.033 — ABNORMAL HIGH (ref 1.005–1.030)

## 2018-12-18 LAB — UA,Microscopic

## 2018-12-19 DIAGNOSIS — R3989 Other symptoms and signs involving the genitourinary system: Secondary | ICD-10-CM | POA: Diagnosis not present

## 2018-12-19 DIAGNOSIS — R310 Gross hematuria: Secondary | ICD-10-CM | POA: Diagnosis not present

## 2018-12-19 LAB — MTB-Quantiferon Gold Plus ELISA: MTB-QUANTIFERON GOLD PLUS ELISA: NEGATIVE

## 2018-12-20 DIAGNOSIS — F1320 Sedative, hypnotic or anxiolytic dependence, uncomplicated: Secondary | ICD-10-CM

## 2018-12-20 DIAGNOSIS — C50919 Malignant neoplasm of unspecified site of unspecified female breast: Secondary | ICD-10-CM

## 2018-12-20 NOTE — Goals of Care
Advance Care Planning   Goals of Care     POLST COMPLETED:  [x]  Completion of POLST.     Part A:  []  Attempt resuscitation/CPR  [x]   Do not attempt resuscitation   Part B:  []  Full treatment  [x]  Selective treatment  []  Comfort-focused treatment   Part C: []  Long-term artificial nutrition  []  Trial of artificial nutrition [x]  No artificial nutrition (but trial of iv fluids ok)    DO NOT WRITE ANY GOALS OF CARE TEXT BELOW THIS LINE

## 2018-12-22 ENCOUNTER — Telehealth: Payer: MEDICARE

## 2018-12-22 NOTE — Telephone Encounter
PDL Call to Practice    Reason for Call:   Patients daughter is requesting to speak to brittany regarding [employer] referral     MD:    Appointment Related?  []  Yes  [x]  No     If yes;  Date:  Time:    Call warm transferred to PDL: [x]  Yes  []  No    Call Received by Practice Representative: brittany

## 2018-12-22 NOTE — Telephone Encounter
Reply by: [name]      Referral sent.    

## 2018-12-23 ENCOUNTER — Ambulatory Visit: Payer: MEDICARE

## 2018-12-23 ENCOUNTER — Ambulatory Visit: Payer: BLUE CROSS/BLUE SHIELD | Attending: Neurology

## 2018-12-23 DIAGNOSIS — G7000 Myasthenia gravis without (acute) exacerbation: Secondary | ICD-10-CM

## 2018-12-23 NOTE — Patient Instructions
-   The office will call you to schedule your MRI of the cervical spine  - Please return to clinic in one month after your MRI is done  - Please continue to take IVIG, Mestinon and Azathioprine 75mg  BID

## 2018-12-23 NOTE — Consults
Neurology History and Physical    PATIENT: [name]  MRN:  [id number]  DOB:  [birth year]  DATE OF SERVICE:  12/23/2018  ATTENDING PHYSICIAN: Odetta Pink., MD, PhD  PRIMARY CARE PHYSICIAN: York Ram., MD    ID/CC: 83 years old female with a past medical history of myasthenia gravis, breast cancer, interstitial cystitis, fibromyalgia, COPD, asthma, recently admitted for worsening of weakness and found to have UTI, referred for management of MG.     HPI:  History obtained from patient and OSH records.     Myasthenia History   In 2017, patient started experiencing double vision for 3 months in the left eye with ptosis. She went to multiple specialists until she was diagnosed by a neurologist Dr. Allena Katz with seropositive MG (Labs 08/26/2015: AChR binding (0.63) blocking (28) antibody, ESR 3, CRP 4.9). She was first started on Imuran and Mestinon during the first year, then IVIG q3w was introduced at year 2 due to poor control. She was not able to tolerate Prednisone due to severe mouth sores. She was not seen by her neurologist in person from March to October 2020 but continued regular infusion treatments.     Recent exacerbation   Patient was recently admitted to [facility] in [facility] after presenting with increased weakness, neck pain, double vision, low energy, drooping of face and wheezing for the past few weeks. At the time, her Myasthenia gravis regimen was IVIG every 3 weeks, Azathiopine 75 mg twice daily and Mestinon 60 mg 3 times daily (discharged on same doses).  She is compliant with her medications. She was recently evaluated by her pulmonologist in place on 2 L of oxygen via nasal cannula at bedtime due to low oxygen saturation in setting of COPD.  She was advised by her neurologist Dr. Allena Katz to go to the emergency room for plasma exchange due to worsening weakness.  At the time of ED presentation, she reported dysuria, lower abdominal pain and hematuria x2 days in the setting of her chronic interstitial cystitis with no fevers.  She was found to have a UTI and was treated with IV Ancef and 5-day course of plasma exchange  (10/1-10/09/20). She did not require any intubation during that admission (NIF at -32 and VC of 1.5L).     Review of Systems:  She felt stronger after her first session of plasmapheresis, but did not appreciate much improvement with subsequent cycles of plasmapheresis. Her daughter feels that she has become much stronger. Currently, she endorses some double vision and droopy eyelids; denies difficulty swallowing and SOB (except on exertion). She continues to have mild weakness in the legs and walks with a cane. She has not suffered any falls since prior to hospital admission, but stumbles a lot. She does not experience a boost energy after IVIG infusion (last tx mid-September).     Past Medical History:  Fibromyalgia    Breast cancer (2017) - contained, no tx   Interstitial cystitis x 30 years   Fall with brief LOC (09/2016)  Fall with knee infection/contusion (10/2018)  Depression and anxiety   Diverticulitis   COPD  Asthma     Past Surgical History:  Meniscus tear repair in left knee    Family History:   Adopted at birth. Birth mother may have had breast cancer.   Brother(58) and sister (100) were both adopted from different families    Social History:   She moved from her independent living place in [place], [place] to [place]  with her daugther.   Pt  reports that she quit smoking about 35 years ago. She has quit using smokeless tobacco. Smoked 0.5 PPD from 1960 -1985. Denies EtOH or recreational drug use. Retired, college-educated, widowed.     Allergies:  Allergies   Allergen Reactions   ? Beta Adrenergic Blockers Other (See Comments)     Avoid d/t Myasthenia Gravis  Avoid d/t Myasthenia Gravis     ? Macrolides And Ketolides Other (See Comments)     Avoid d/t Myasthenia Gravis Use with caution d/t Myasthenia Gravis  Use with caution d/t Myasthenia Gravis     ? Sulfa Antibiotics Other (See Comments)     May possibly have caused deafness in one ear  May possibly have caused deafness in one ear  other     ? Botulinum Toxins Other (See Comments)     Muscle weakness r/t Myasthenia Gravis  Muscle weakness r/t Myasthenia Gravis     ? Statins Other (See Comments)     Muscle aches  Muscle aches  Muscle aches     ? Plastic Tape Itching and Other (See Comments)     Turns the skin red where applied        Medications:   Calcium carbonate 500mg    Azathioprine 75mg  BID   Mestinon 60mg  TID   ClonazepaM 1mg  qhs  Tylenol 325mg  PRN prior to infusions  Albuterol 108 (90 base) MCG/ACT inhaler  Montelukast 10 mg PRN  Trimethoprim 100mg  qd   Dexlansoprazole 60 mg qd  Methylcellulose oral power 1packet   Meclizine 12.5 mg TID PRN for dizziness  Buproprion 200mg  qd     Physical Exam  24 hour vitals:    BP 157/75  ~ Pulse 95  ~ Temp 36.4 ?C (97.6 ?F) (Temporal)  ~ Resp 18  ~ Ht 5' 4'' (1.626 m)  ~ Wt 140 lb 3.2 oz (63.6 kg)  ~ BMI 24.07 kg/m?      Gen: NAD, well-appearing, cooperative, appears younger than stated age.  HEENT: atraumatic, no scleral icterus   CV: RRR, no murmurs, rubs or gallops  Resp: CTA bilaterally, no wheeze or rhonci. Able to count to 16-17 on a single breath.   Abd: Soft, Nontender, Nondistended   Ext: No edema  Skin: no rash or erythema    Neurological Exam  Mental Status  Awake, alert and oriented to person, place and time. Recalls 3 of 3 objects immediately. At 5 minutes recalls 3 of 3 objects. Speech is normal. Language is fluent with no aphasia. Attention and concentration are normal. Fund of knowledge is appropriate for level of education.    Cranial Nerves  CN II: Visual fields full to confrontation. Blurry vision but no double vision.  CN III, IV, VI: Extraocular movements intact bilaterally.  CN V: Facial sensation is normal.  CN VII: Full and symmetric facial movement. CN VIII: Hearing is normal.  CN IX, X: Palate elevates symmetrically. Normal gag reflex.  CN XI: Shoulder shrug strength is normal.  CN XII: Tongue midline without atrophy or fasciculations.  No left ptosis .    Motor  Normal muscle bulk throughout. No fasciculations present. Normal muscle tone.                                             Right  Left  Neck flexion                           4+                          4+  Neck extension                      4+                          4+   Shoulder abduction               4                          4  Elbow flexion                         4+                          4+  Elbow extension                    4+                          4+  Wrist flexion                           4+                          4+  Wrist extension                      4+                          4+  Hip flexion                              4                          4  Knee flexion                           5                          3  Knee extension                      5                          5  Plantarflexion                         5                          5  Dorsiflexion  5                          5    Sensory  Light touch is normal in upper and lower extremities.     Reflexes                                           Right                      Left  Brachioradialis                    2+                         2+  Biceps                                 2+                         2+  Triceps                                3+                         3+  Patellar                                3+                         3+  Achilles                                2+                         2+  Plantar                           Downgoing                Downgoing    Right pathological reflexes: Hoffmann's absent.  Left pathological reflexes: Hoffmann's absent.    Coordination  Right: Finger-to-nose normal. Rapid alternating movement normal. Heel-to-shin normal.  Left: Finger-to-nose normal. Rapid alternating movement normal. Heel-to-shin normal.  Mild bilateral intention tremors..    Gait  Casual gait: Unable to rise from chair without using arms.  Walks steadily with a cane.    Labs   General Labs:     Hemoglobin   Date Value Ref Range Status   12/17/2018 12.2 11.6 - 15.2 g/dL Final     Hematocrit   Date Value Ref Range Status   12/17/2018 36.9 34.9 - 45.2 % Final     White Blood Cell Count   Date Value Ref Range Status   12/17/2018 8.75 4.16 - 9.95 x10E3/uL Final     Platelet Count, Auto   Date Value Ref Range Status   12/17/2018 420 (H) 143 - 398  x10E3/uL Final     Mean Corpuscular Hemoglobin   Date Value Ref Range Status   12/17/2018 33.8 (H) 26.4 - 33.4 pg Final     Mean Corpuscular Volume   Date Value Ref Range Status   12/17/2018 102.2 (H) 79.3 - 98.6 fL Final        Sodium   Date Value Ref Range Status   12/17/2018 141 135 - 146 mmol/L Final     Potassium   Date Value Ref Range Status   12/17/2018 4.1 3.6 - 5.3 mmol/L Final     Chloride   Date Value Ref Range Status   12/17/2018 101 96 - 106 mmol/L Final     Total CO2   Date Value Ref Range Status   12/17/2018 26 20 - 30 mmol/L Final     Urea Nitrogen   Date Value Ref Range Status   12/17/2018 17 7 - 22 mg/dL Final     Creatinine   Date Value Ref Range Status   12/17/2018 1.13 0.60 - 1.30 mg/dL Final     Calcium   Date Value Ref Range Status   12/17/2018 9.8 8.6 - 10.4 mg/dL Final     Glucose   Date Value Ref Range Status   12/17/2018 93 65 - 99 mg/dL Final      Urinalysis    UA Specimen Comment    Urine Color Lab Results  (Last 360 days)    Result      Dark Yellow  12/17/18 1549         Specific Gravity IRIS Lab Results  (Last 360 days)    Result      1.033  12/17/18 1549         PH Urine Lab Results  (Last 360 days)    Result      5.5  12/17/18 1549         Blood Urine Lab Results  (Last 360 days)    Result      Trace  12/17/18 1549         Bilirubin Urine Lab Results  (Last 360 days) Result      Negative  12/17/18 1549         Ketones Urine Lab Results  (Last 360 days)    Result      1+  12/17/18 1549         Glucose Urine Lab Results  (Last 360 days)    Result      Negative  12/17/18 1549         Protein Urine Lab Results  (Last 360 days)    Result      1+  12/17/18 1549         Nitrite Urine Lab Results  (Last 360 days)    Result      Negative  12/17/18 1549         Leukocyte Esterase Urine Lab Results  (Last 360 days)    Result      Negative  12/17/18 1549         RBCS Urine Lab Results  (Last 360 days)    Result      0  cells/uL 12/17/18 1549         WBCS Urine Lab Results  (Last 360 days)    Result      18  cells/uL 12/17/18 1549         Squamous Epithelial Cells Lab Results  (Last 360 days)  Result      7  cells/uL 12/17/18 1549         Transitional Epithelial Cells    Urine Comment          ?  Labs 08/26/2015: AChR binding (0.63) blocking (28) antibody, ESR 3, CRP 4.9    Imaging and Diagnostics    MRI/MRA head 09/09/2015:  This MR angiogram of the intracerebral arteries shows the following:  1. ??Very minimal atherosclerotic change within the left posterior cerebral artery that is unlikely to be clinically significant.  2. ??There is a normal variant with the left posterior cerebral artery obtaining its flow from the anterior circulation.  3. ???No aneurysms are seen.  4. ???No new findings compared to the 11/25/2003 MRA. ??  ?  MRI orbit wwo contrast 09/09/2015: This is is a normal MRI of the orbits with and without contrast.    ASSESSMENT & PLAN:  83 years old female with a pmhx of myasthenia gravis, breast cancer, interstitial cystitis, fibromyalgia, COPD, asthma, recently admitted for worsening of weakness and found to have UTI, referred for management of MG. She was diagnosed with seropositive MG (AChR binding (0.63) blocking (28) antibodies) in 2017 and had been stable on Mestinon 60mg  TID, Azathioprine 75mg  BID and IVIG q3w for the past 2 years until the past three months. She has been experiencing a slow decline with more fatigable weakness, double vision and DOE (now on 2L NC nightly due to COPD). She was recently hospitalized in [place] with a MG exacerbation in setting of a UTI and improved s/p 5 treatments of plasmapheresis (10/01-10/09/20), though she does not feel back to her baseline. She did not tolerate prednisone in the past due to oral ulcers and has not tried Solaris in the past. Repetitive nerve stimulation at Abductor digiti minimi and trapezius in office today did not show any decrement indication that MG is currently well controlled, so we will not change any medications today. However, her exam was concerning for stooped posture and subte left leg drag with triceps and patellar hyperreflexia which will be further evaluated with MRI Cervical spine wo contrast.    Plan   - Continue   - MRI Cervical spine wo contrast to r/o myelopathy  - Prior authorization submitted for IVIG 60g q3weeks administered by [facility]    Patient seen, examined, and discussed at bedside with attending, ***.       Jennings Books, MD  Neurology Resident  Pager [phone]

## 2018-12-24 MED ORDER — PHENAZOPYRIDINE HCL 100 MG PO TABS
100 mg | ORAL_TABLET | Freq: Three times a day (TID) | ORAL | 1 refills | Status: AC
Start: 2018-12-24 — End: ?

## 2018-12-25 ENCOUNTER — Ambulatory Visit: Payer: MEDICARE

## 2018-12-25 MED ORDER — TRIAMCINOLONE ACETONIDE 0.1 % EX CREA
Freq: Two times a day (BID) | TOPICAL | 1 refills | Status: AC
Start: 2018-12-25 — End: ?

## 2018-12-26 ENCOUNTER — Telehealth: Payer: MEDICARE

## 2018-12-26 NOTE — Telephone Encounter
Call Back Request    MD:  Dr. Jefm Bryant     Reason for call back: Pt's daughter, [place], called regarding IVIG treatment. [place] wanted to know what needed to be done to initiate IVIG treatment. Informed [place] has been submitted. [place] would like a c/b once authorization has been received. Thank you.     Any Symptoms:  []  Yes  [x]  No      ? If yes, what symptoms are you experiencing:    o Duration of symptoms (how long):    o Have you taken medication for symptoms (OTC or Rx):      Patient or caller has been notified of the 24-48 hour turnaround time.

## 2018-12-29 NOTE — Telephone Encounter (Signed)
Dr.  Annamaria Boots please advise on email sent by patient his morning.  "Dr Annamaria Boots,  This is Erica Drake texting you from [place] where I am with my daughter.  Don't know exactly when I will be back in [place] due to Covid. You ordered Oxygen for me and [employer] delivered a concentrator to my address at [employer] at [place].  In that I don't know when I will return, if you could place the order for [employer] to pick the tank up from my apartment.  Call Dario Guardian and she will arrange for the door to my apt to be opened by security.  Her #is [phone] at [facility].  Thanks, Erica Drake"  Allergies  Allergen Reactions  . Cortisone Shortness Of Breath, Swelling and Other (See Comments)    Tongue swelling, Injection into knees and hips ave not been issue 2020.    . Sulfa Antibiotics Other (See Comments)    May possibly have caused deafness in one ear  . Adhesive [Tape] Itching and Other (See Comments)    Turns the skin red where applied  . Amlodipine Besylate Cough  . Cellcept [Mycophenolate] Other (See Comments)    Caused severe stomach cramping  . Chlorhexidine Gluconate Itching and Other (See Comments)    Skin on face turned red, also   . Clindamycin Itching  . Dexamethasone Other (See Comments)    Per MD  . Duloxetine Other (See Comments)    Reaction not recalled  . Morphine Sulfate Other (See Comments)    Reaction not recalled  . Niacin Other (See Comments)    Reaction not recalled  . Other Other (See Comments)    Pollen - nasal reaction/congestion  . Prednisone Other (See Comments)    Causes stomach pain. Sores in her mouth.   . Rosuvastatin Calcium Other (See Comments)    Muscle aches  . Statins Other (See Comments)    Muscle aches  . Valsartan Other (See Comments)    Reaction not recalled  . Strawberry Extract Itching and Rash   Current Outpatient Medications on File Prior to Visit  Medication Sig Dispense Refill  . acetaminophen (TYLENOL) 325 MG tablet Take 325 mg by  mouth See admin instructions. Take 325 mg by mouth one hour prior to Gamunex infusion (every 21 days)    . albuterol (PROAIR HFA) 108 (90 Base) MCG/ACT inhaler Inhale 1-2 puffs into the lungs every 4 (four) hours as needed for wheezing or shortness of breath. 1 Inhaler 12  . azaTHIOprine (IMURAN) 50 MG tablet Take 75 mg by mouth 2 (two) times daily.    Marland Kitchen buPROPion (WELLBUTRIN SR) 200 MG 12 hr tablet Take 200 mg by mouth 2 (two) times daily after a meal.     . calcium carbonate (TUMS - DOSED IN MG ELEMENTAL CALCIUM) 500 MG chewable tablet Chew 1 tablet (200 mg of elemental calcium total) by mouth daily. 30 tablet 1  . clonazePAM (KLONOPIN) 1 MG disintegrating tablet Take 1 mg by mouth at bedtime.     Marland Kitchen dexlansoprazole (DEXILANT) 60 MG capsule Take 60 mg by mouth daily.    . meclizine (ANTIVERT) 12.5 MG tablet Take 1 tablet (12.5 mg total) by mouth 3 (three) times daily as needed for dizziness. 30 tablet 3  . methylcellulose (CITRUCEL) oral powder Take 1 packet by mouth daily as needed (for moderate constipation).     . montelukast (SINGULAIR) 10 MG tablet Take by mouth.    . pyridostigmine (MESTINON) 60 MG tablet  TAKE 1 TABLET BY MOUTH 3 TIMES A DAY. (Patient taking differently: Take 60 mg by mouth 3 (three) times daily with meals. ) 270 tablet 0  . trimethoprim (TRIMPEX) 100 MG tablet Take 100 mg by mouth daily.  2   No current facility-administered medications on file prior to visit.

## 2018-12-29 NOTE — Procedures
Please refer to ''notes'' tab for formatted table view.  ---------------------------------------------------------------------------------    [facility] Department of Neurology EMG Laboratory  [address]   [place] [phone]     [name] [phone]    Test Date:  12/23/2018    Patient: [name] DOB: Nov 10, 1935 Physician: Lorelei Pont, MD PhD   Sex: Female   Fellow: Ernestine Mcmurray, MD   ID#: [id number]         Patient History / Exam:  83 year old female with myasthenia gravis with concern for possible exacerbation.     RNS   Trial # Label Amp 1 (mV)  O-P Amp 5 (mV)  O-P Amp % Dif Area 1 (mVms) Area 5 (mVms) Area % Dif Rep Rate   Left Abd Dig Minimi   Tr 1 Baseline 5.00 5.43 8.5 19.17 18.92 -1.3 3.00   Tr 2 Post Exercise 4.93 4.99 1.3 18.51 18.14 -2.0 3.00   Tr 3 1 min Post 4.92 5.29 7.4 22.10 20.86 -5.6 3.00   Left Trapezius   Tr 1 Baseline 5.96 5.94 -0.3 42.03 36.53 -13.1 3.00   Tr 2 Post Exercise 6.61 6.75 2.1 41.05 36.90 -10.1 3.00   Tr 3 1 min Post  6.24 6.47 3.6 40.30 36.54 -9.3 3.00     Results:  1. 3 Hz Repetitive Nerve Stimulation of the left ulnar-ADM demonstrated no reproducible decremental response pattern prior to and after 1 minute of exercise.    2. 3 Hz Repetitive Nerve Stimulation of the left accessory-trapezius demonstrated no reproducible decremental response pattern prior to and after 1 minute of exercise.      Impression:  This is an abnormal study.  There is no electrodiagnostic evidence of a clinically active myasthenic gravis in this study.    Ernestine Mcmurray, MD  Neuromuscular Medicine Fellow    Lorelei Pont, MD PhD  EMG Attending                    Ultrasound Images:  Waveforms:

## 2018-12-29 NOTE — Addendum Note
Addended by: Lorelei Pont B on: 12/28/2018 09:42 PM     Modules accepted: Orders

## 2018-12-30 NOTE — Telephone Encounter (Signed)
[place] with me.  Order- please try to communicate this request to [employer].

## 2018-12-30 NOTE — Telephone Encounter (Signed)
Called [employer] logistics team at [phone] and left a VM to call back to help with the situation.

## 2018-12-31 ENCOUNTER — Telehealth: Payer: BLUE CROSS/BLUE SHIELD | Attending: Geriatric Medicine

## 2018-12-31 ENCOUNTER — Ambulatory Visit: Payer: BLUE CROSS/BLUE SHIELD

## 2018-12-31 DIAGNOSIS — N3090 Cystitis, unspecified without hematuria: Secondary | ICD-10-CM

## 2018-12-31 DIAGNOSIS — R339 Retention of urine, unspecified: Secondary | ICD-10-CM

## 2018-12-31 DIAGNOSIS — F1320 Sedative, hypnotic or anxiolytic dependence, uncomplicated: Secondary | ICD-10-CM

## 2018-12-31 MED ORDER — TRIMETHOPRIM 100 MG PO TABS
100 mg | ORAL_TABLET | Freq: Every day | ORAL | 3 refills | 60.00000 days | Status: AC
Start: 2018-12-31 — End: ?

## 2018-12-31 MED ORDER — PHENAZOPYRIDINE HCL 200 MG PO TABS
200 mg | ORAL_TABLET | Freq: Three times a day (TID) | ORAL | 2 refills | Status: AC
Start: 2018-12-31 — End: ?

## 2018-12-31 MED ORDER — AZATHIOPRINE 50 MG PO TABS
75 mg | ORAL_TABLET | Freq: Two times a day (BID) | ORAL | 3 refills | Status: AC
Start: 2018-12-31 — End: ?

## 2018-12-31 MED ORDER — PYRIDOSTIGMINE BROMIDE 60 MG PO TABS
60 mg | ORAL_TABLET | Freq: Three times a day (TID) | ORAL | 3 refills | 30.00000 days | Status: AC
Start: 2018-12-31 — End: ?

## 2018-12-31 NOTE — Progress Notes
Outpatient Geriatrics Clinic Video Visit     Patient Consent to Telehealth Questionnaire   [facility] TELEHEALTH PRECHECKIN QUESTIONS 12/31/2018   By clicking ''I Agree'', I consent to the below:  I Agree     - I agree  to be treated via a video visit and acknowledge that I may be liable for any relevant copays or coinsurance depending on my insurance plan.  - I understand that this video visit is offered for my convenience and I am able to cancel and reschedule for an in-person appointment if I desire.  - I also acknowledge that sensitive medical information may be discussed during this video visit appointment and that it is my responsibility to locate myself in a location that ensures privacy to my own level of comfort.  - I also acknowledge that I should not be participating in a video visit in a way that could cause danger to myself or to those around me (such as driving or walking).  If my provider is concerned about my safety, I understand that they have the right to terminate the visit.         Patient: [name]  MRN: [id number]  PCP: York Ram., MD  DATE: 12/31/2018    Video visit is being conducted today instead of an in-person visit due to COVID-19.     Time at start of visit: ** *    Chief complaint:     HPI:    Saw Baxter today.  Taking pyridium and having better level of comfort with interstitial cystitis      CHRONIC CONDITIONS:  1. Myasthenia Gravis  [x] ? Stable  [] ? Improved   [] ? Worse   2.Hypercholesterolemia  [x] ? Stable  [] ? Improved   [] ? Worse   3. Mcrocytosis  [x] ? Stable  [] ? Improved   [] ? Worse   ?      PMH:  No past medical history on file.    Allergies:  Allergies   Allergen Reactions   ? Beta Adrenergic Blockers Other (See Comments)     Avoid d/t Myasthenia Gravis  Avoid d/t Myasthenia Gravis     ? Macrolides And Ketolides Other (See Comments)     Avoid d/t Myasthenia Gravis  Use with caution d/t Myasthenia Gravis  Use with caution d/t Myasthenia Gravis ? Sulfa Antibiotics Other (See Comments)     May possibly have caused deafness in one ear  May possibly have caused deafness in one ear  other     ? Botulinum Toxins Other (See Comments)     Muscle weakness r/t Myasthenia Gravis  Muscle weakness r/t Myasthenia Gravis     ? Statins Other (See Comments)     Muscle aches  Muscle aches  Muscle aches     ? Plastic Tape Itching and Other (See Comments)     Turns the skin red where applied       Medications:  Current Outpatient Medications   Medication Sig   ? acetaminophen 325 mg tablet Take 650 mg by mouth every six (6) hours as needed for Pain.   ? Albuterol Sulfate AEPB Inhale Takes 1 to 2 puffs every 4 hours as needed for wheezing or SOB .   ? azaTHIOprine 50 mg tablet Take 75 mg by mouth two (2) times daily.   ? buPROPion, SR, (WELLBUTRIN SR) 200 mg 12 hr tablet Wellbutrin SR 200 mg tablet, 12 hr sustained-release   ? calcium carbonate 500 mg chewable tablet Chew 1 tablet by mouth  daily Calcium Carbonate 500 mg is equivalent to 200 mg elemental Calcium .   ? clonazePAM 1 mg disintegrating tablet Take 1 mg by mouth at bedtime.   ? dexlansoprazole 60 mg DR capsule Take 1 capsule (60 mg total) by mouth daily.   ? fosfomycin 3 g powder packet Take 1 packet by mouth once (Dissolve packet in 90 mL of water. For partial package doses, give appropriate portion.) .   ? meclizine 12.5 mg tablet Take 1 tablet (12.5 mg total) by mouth three (3) times daily as needed.   ? Methylcellulose POWD 1 packet by Does not apply route as needed for.   ? montelukast 10 mg tablet Take 10 mg by mouth as needed for.   ? phenazopyridine 200 mg tablet Take 200 mg by mouth three (3) times daily with meals.   ? polyethylene glycol powder packet Take 17 g by mouth.   ? pyridostigmine 60 mg tablet Take 60 mg by mouth.   ? triamcinolone 0.1% cream Apply topically two (2) times daily.   ? trimethoprim 100 mg tablet Take 100 mg by mouth daily. No current facility-administered medications for this visit.        Social History:  Social History     Social History Narrative   ? Not on file       Physical Exam:    Gen [x]  NAD     Eyes []  Conj/Lids  []   Sclerae non-icteric   []  EOMI     ENT []  Oropharynx  []  Hearing     Neck []  Inspect     Resp []  Effort     CV []  Edema     Lymph      GI      Neuro []  CN2-12   []   Pronator drift   []  Finger to nose         MS []  Gait []  Inspect/palpate []  Muscle Tone []  ROM  []  Balance     Skin []  Inspection     Psych []  Insight/judgment  []  Affect  []  Cognition           Labs and studies (I personally reviewed):    Lab Results   Component Value Date    WBC 8.75 12/17/2018    HGB 12.2 12/17/2018    MCV 102.2 (H) 12/17/2018    PLT 420 (H) 12/17/2018     Lab Results   Component Value Date    NA 141 12/17/2018    K 4.1 12/17/2018    CREAT 1.13 12/17/2018    GFRESTNOAA 46 12/17/2018    CALCIUM 9.8 12/17/2018     Lab Results   Component Value Date    ALT 11 12/17/2018    AST 20 12/17/2018    ALKPHOS 61 12/17/2018    BILITOT 0.2 12/17/2018    ALBUMIN 4.3 12/17/2018     Lab Results   Component Value Date    TSH 1.00 12/17/2018      No results found for: HGBA1C  No results found for: CHOL, CHOLDLCAL  No results found for: CHOLDLQ  No results found for: VITD25OH  No results found for: BNP      Assessment and plan:    #Macrocytosis--check B12, folic acid at some point  #Deconditioning--initiate home PT  #GERD--s/p Nisan fundoplication  #COPD--mixed--per PFT's from [name].--consider reinitiation of inhalers.  #s/p rectocele repair  #s/p cholecystectomy  #Nocturnal hypoxemia--presumably related to MG crisis but has history of COPD.  Will order  nocturnal home O2 study and consider PFT's/Pulmonary evaluation once settled down.  May need to restart BREO inhaler.  #Myasthenia Gravis--s/p plasmapheresis x5 October 2020, no Thymoma, on Azathioprine 150 daily and mestinon 60 tid.  Saw Dr. Enedina Finner at [facility].  Was considering Soliris.  Ordering IVIG and MRI  #IBS--titrate miralax to comfort.  Consider duloxetine substitute for buproprion.  #Interstitial cystitis--on daily Trimethoprim 100 for UTI prophylaxis.  Using bladder instillations prn. OFF topical estrogen due to advice from neurologist re: MG.  #Major Depression--active.  Likely to add or substitute duloxetine  #Allergic rhinitis--previously on nasal steroids.  #Breast CA--DCIS by report--obtain mammo.  Patient declined surgery in 2019 and experienced dry mouth from Tamoxifen.  Was considering Anastrozole.  Per records from [facility]:   ''07/20/2017 Screening detected right breast calcifications and distortion. The distortion was in the upper outer quadrant: Biopsy DCIS intermediate grade with CSL; calcifications UIQ: 2.8 cm: Biopsy DCIS+ ALH +CSL; 4 cm apart, axilla negative, Tis NX stage 0''  #DM with CKD--stabe 3b--based on labs from [name].  FOllow here.  If BP permits, may consider ARB BUT patient with h/o angioedema so will ONLY initiate this agent with great caution.  #OA knees--previously getting hyaluronic acid injections.  #Anxiety  #Benzodiazepine dependence--will work to wean over time.  #Fibromyalgia--duloxetine as above.  Trial PT  #deafness in one ear--will schedule Audiology once COVID concerns less.  #Polypharmacy  #Statin intolerance    # advice given about precautions for coronavirus    GRACE CHENG    # advanced care planning/goals of care discussion.     RTC    Assessment of Patient's Function      Clinical Frailty Scale:    Patient's score:                The above plan of care, diagnosis, orders, and follow-up were discussed with the patient and/or surrogate.  Questions related to this recommended plan of care were answered.    Greater than 50% of today's   minute visit was spent counseling the patient and family regarding the above medical conditions, treatment options and management of expectations and in coordination of care with staff.        Time at end of visit: ** *    I spent the following amount of face to face minutes on this video encounter with the patient:   Return/established patient    New patient  []   5-9 minutes     (16109)    []  10-19 minutes (99201)  []   10-14 minutes (60454)    []  20-29 minutes (09811)  []   15-24 minutes (99213)    []  30-44 minutes (91478)  []   25-39 minutes (99214)    []  45-59 minutes (29562)  []   >40 minutes    (13086)    []  >60 minutes    (57846)      [None of the following criteria are required at this time due to CoVID-19.]  []   This patient is an established patient in my practice.   []   This patient lives in [place].  []   I am a licensed physician in the state of [place].       Signed: York Ram, MD

## 2018-12-31 NOTE — Telephone Encounter
Faxed out IVIG referral to [place], Fax# [phone], received confirmation.

## 2018-12-31 NOTE — Progress Notes
Dear Dr. Brayton El, [name], MD,    I had the pleasure of meeting [name] in the urology office today for evaluation of her voiding dysfunction.    Prior GU Surgery or Evaluation: seen recently by Dr Lorin Picket at [place]. Recommended to have cystoscopy. At that initial visit given instillation, symptoms worsened. For that reason and because she would like all of her care under one umbrella [facility]), she is here for second opinion.    >3 decades of twice weekly bladder instillations in [place], [place]. Last cystoscopy was about 35 years ago. No examination since that time. Had urethra dilated on a couple of occasions.    Describes rectocele repair about 2 years ago at [place], [place], after which she was put on nitrofurantoin daily for 2 years and did great. No other intervention during those 2 years.    Describes pelvic/vaginal/bladder discomfort on long car rides or when seated on long airplane ride.     Reports only 3 positive cultures in past 3 decades    Has been on vaginal valium, estrogen (though not clear if systemic v vaginal).    ROS reviewed with the patient x14 systems, pertinent positives: as per hpi, otherwise non con.    Past Medical History: reviewed, most significant/pertinent for myasthenia gravis  Past Surgical History: reviewed    Family History reviewed: non con  Social History reviewed:   Social History     Socioeconomic History   ? Marital status: Widowed     Spouse name: Not on file   ? Number of children: Not on file   ? Years of education: Not on file   ? Highest education level: Not on file   Occupational History   ? Not on file   Social Needs   ? Financial resource strain: Not on file   ? Food insecurity:     Worry: Not on file     Inability: Not on file   ? Transportation needs:     Medical: Not on file     Non-medical: Not on file   Tobacco Use   ? Smoking status: Former Smoker     Last attempt to quit: 1985     Years since quitting: 35.8   ? Smokeless tobacco: Former Transport planner and Sexual Activity   ? Alcohol use: Not on file   ? Drug use: Not on file   ? Sexual activity: Not on file   Lifestyle   ? Physical activity:     Days per week: Not on file     Minutes per session: Not on file   ? Stress: Not on file   Relationships   ? Social connections:     Talks on phone: Not on file     Gets together: Not on file     Attends religious service: Not on file     Active member of club or organization: Not on file     Attends meetings of clubs or organizations: Not on file     Relationship status: Not on file   Other Topics Concern   ? Not on file   Social History Narrative   ? Not on file       Allergies:   Allergies   Allergen Reactions   ? Beta Adrenergic Blockers Other (See Comments)     Avoid d/t Myasthenia Gravis  Avoid d/t Myasthenia Gravis     ? Macrolides And Ketolides Other (See Comments)     Avoid d/t Myasthenia  Gravis  Use with caution d/t Myasthenia Gravis  Use with caution d/t Myasthenia Gravis     ? Sulfa Antibiotics Other (See Comments)     May possibly have caused deafness in one ear  May possibly have caused deafness in one ear  other     ? Botulinum Toxins Other (See Comments)     Muscle weakness r/t Myasthenia Gravis  Muscle weakness r/t Myasthenia Gravis     ? Statins Other (See Comments)     Muscle aches  Muscle aches  Muscle aches     ? Plastic Tape Itching and Other (See Comments)     Turns the skin red where applied       Medications:   Outpatient Medications Prior to Visit   Medication Sig   ? fosfomycin 3 g powder packet Take 1 packet by mouth once (Dissolve packet in 90 mL of water. For partial package doses, give appropriate portion.) .   ? phenazopyridine 200 mg tablet Take 200 mg by mouth three (3) times daily with meals.   ? acetaminophen 325 mg tablet Take 650 mg by mouth every six (6) hours as needed for Pain.   ? Albuterol Sulfate AEPB Inhale Takes 1 to 2 puffs every 4 hours as needed for wheezing or SOB . ? azaTHIOprine 50 mg tablet Take 75 mg by mouth two (2) times daily.   ? buPROPion, SR, (WELLBUTRIN SR) 200 mg 12 hr tablet Wellbutrin SR 200 mg tablet, 12 hr sustained-release   ? calcium carbonate 500 mg chewable tablet Chew 1 tablet by mouth daily Calcium Carbonate 500 mg is equivalent to 200 mg elemental Calcium .   ? clonazePAM 1 mg disintegrating tablet Take 1 mg by mouth at bedtime.   ? dexlansoprazole 60 mg DR capsule Take 1 capsule (60 mg total) by mouth daily.   ? meclizine 12.5 mg tablet Take 1 tablet (12.5 mg total) by mouth three (3) times daily as needed.   ? Methylcellulose POWD 1 packet by Does not apply route as needed for.   ? montelukast 10 mg tablet Take 10 mg by mouth as needed for.   ? polyethylene glycol powder packet Take 17 g by mouth.   ? pyridostigmine 60 mg tablet Take 60 mg by mouth.   ? triamcinolone 0.1% cream Apply topically two (2) times daily.   ? trimethoprim 100 mg tablet Take 100 mg by mouth daily.     No facility-administered medications prior to visit.        Postvoid residual by ultrasound: 12 mL    Physical Examination:  Nad, aao  Euthymic mood, appropriate affect  abd soft nt nd, no cvat  Ext no c/c/e    Impression: irritative voiding, cystitis, h/o urethral dilations and bladder instillations for 30 years in [name] without cystoscopy in decades.      Plan: urine for culture, cystoscopy + pelvic exam, consider referral to Dr Baldwin Crown    Time: more than 25 minutes, greater than 50% in counseling and coordination of care.    Thank you for allowing me to participate in her care. If you have any questions or concerns, please feel free to contact me at any time.    Sincerely,    [place] Mikias Lanz, MD  Associate Professor of Urology  Division of Pelvic Medicine and Reconstructive Surgery  [facility]

## 2018-12-31 NOTE — Telephone Encounter
Please perform an overnight oxygen test.    Dx copd    Electronically signed  Midge Aver, MD

## 2019-01-01 ENCOUNTER — Telehealth: Payer: BLUE CROSS/BLUE SHIELD

## 2019-01-01 ENCOUNTER — Inpatient Hospital Stay: Payer: BLUE CROSS/BLUE SHIELD | Attending: Neurology

## 2019-01-01 ENCOUNTER — Telehealth: Payer: MEDICARE

## 2019-01-01 DIAGNOSIS — M50321 Other cervical disc degeneration at C4-C5 level: Secondary | ICD-10-CM | POA: Diagnosis not present

## 2019-01-01 DIAGNOSIS — Q7649 Other congenital malformations of spine, not associated with scoliosis: Secondary | ICD-10-CM | POA: Diagnosis not present

## 2019-01-01 NOTE — Telephone Encounter
.    Dr. Marchia Bond.  Thank you for taking the time to speak with me this morning.  This is the patient whom I mentioned who is trying to navigate the world of drug benefits after moving to [place].    Her daugh-ter, a wonderful advocate, is Mrs. Georgia Lopes, and can be best reached at [phone].    Thanks in advance for your kind expertise!

## 2019-01-02 LAB — Bacterial Culture Urine
BACTERIAL CULTURE URINE: <10000 — AB
BACTERIAL CULTURE URINE: 60000 — AB
BACTERIAL CULTURE URINE: 60000 — AB

## 2019-01-06 ENCOUNTER — Ambulatory Visit: Payer: MEDICARE

## 2019-01-06 DIAGNOSIS — R339 Retention of urine, unspecified: Secondary | ICD-10-CM

## 2019-01-06 DIAGNOSIS — N3941 Urge incontinence: Secondary | ICD-10-CM

## 2019-01-06 DIAGNOSIS — R3915 Urgency of urination: Secondary | ICD-10-CM

## 2019-01-06 MED ORDER — DOXAZOSIN MESYLATE 2 MG PO TABS
2 mg | ORAL_TABLET | Freq: Every evening | ORAL | 3 refills | 32.00000 days | Status: AC
Start: 2019-01-06 — End: ?

## 2019-01-06 NOTE — Progress Notes
I had the pleasure of seeing [name], a 83 y.o. female, in the urology office today regarding her irritative voiding.    Voided urine revealed bacteriuria, but cystoscopy reveals no inflammation, no suggestion of bacterial cystitis. No systemic sign of infection either, ergo no antibiotics indicated.    Allergies   Allergen Reactions   ? Beta Adrenergic Blockers Other (See Comments)     Avoid d/t Myasthenia Gravis  Avoid d/t Myasthenia Gravis     ? Macrolides And Ketolides Other (See Comments)     Avoid d/t Myasthenia Gravis  Use with caution d/t Myasthenia Gravis  Use with caution d/t Myasthenia Gravis     ? Sulfa Antibiotics Other (See Comments)     May possibly have caused deafness in one ear  May possibly have caused deafness in one ear  other     ? Botulinum Toxins Other (See Comments)     Muscle weakness r/t Myasthenia Gravis  Muscle weakness r/t Myasthenia Gravis     ? Statins Other (See Comments)     Muscle aches  Muscle aches  Muscle aches     ? Plastic Tape Itching and Other (See Comments)     Turns the skin red where applied       Outpatient Medications Prior to Visit   Medication Sig   ? acetaminophen 325 mg tablet Take 650 mg by mouth every six (6) hours as needed for Pain.   ? Albuterol Sulfate AEPB Inhale Takes 1 to 2 puffs every 4 hours as needed for wheezing or SOB .   ? azaTHIOprine 50 mg tablet Take 1.5 tablets (75 mg total) by mouth two (2) times daily.   ? buPROPion, SR, (WELLBUTRIN SR) 200 mg 12 hr tablet Wellbutrin SR 200 mg tablet, 12 hr sustained-release   ? calcium carbonate 500 mg chewable tablet Chew 1 tablet by mouth daily Calcium Carbonate 500 mg is equivalent to 200 mg elemental Calcium .   ? clonazePAM 1 mg disintegrating tablet Take 1 mg by mouth at bedtime.   ? dexlansoprazole 60 mg DR capsule Take 1 capsule (60 mg total) by mouth daily. ? fosfomycin 3 g powder packet Take 1 packet by mouth once (Dissolve packet in 90 mL of water. For partial package doses, give appropriate portion.) .   ? meclizine 12.5 mg tablet Take 1 tablet (12.5 mg total) by mouth three (3) times daily as needed.   ? Methylcellulose POWD 1 packet by Does not apply route as needed for.   ? montelukast 10 mg tablet Take 10 mg by mouth as needed for.   ? phenazopyridine 200 mg tablet Take 1 tablet (200 mg total) by mouth three (3) times daily with meals.   ? polyethylene glycol powder packet Take 17 g by mouth.   ? pyridostigmine 60 mg tablet Take 1 tablet (60 mg total) by mouth three (3) times daily.   ? triamcinolone 0.1% cream Apply topically two (2) times daily.   ? trimethoprim 100 mg tablet Take 1 tablet (100 mg total) by mouth daily.     No facility-administered medications prior to visit.        Exam:   Nad, aao  Euthymic mood, appropriate affect  abd soft nt nd, no cvat  Ext no c/c/e    Uroflow, noninvasive: Qmax: 11 mL/sec, Voided Volume 210 mL    PVR by ultrasound: 28 mL    Impression:    1. Vulvodynia  2. OAB with urinary urge incontinence in  setting of myasthenia gravis and failed myrbetriq  3. Bladder neck obstruction with weak stream    Plan:   1. Refer to Dr Baldwin Crown at [facility] gynecology  2. Axonics sacral neuromodulation for bladder, urinary urge incontinence  3. Doxazosin for bladder neck obstruction    Time: 40 minutes, more than 50% in counseling and coordination of care.    Thank you for allowing me to participate in [facility] care. If you have any questions or concerns, please feel free to contact me at any time.  Sincerely,    [place] Veronica Guerrant, MD  Associate Professor of Urology  Division of Pelvic Medicine and Reconstructive Surgery  [facility]

## 2019-01-06 NOTE — Patient Instructions
1. 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    2. Overactive bladder, we recommend sacral neuromodulation (Axonics implant)    3. For the hypertonic, tense bladder neck, we should try you on medication like Flomax/tamsulosin to facilitate ease of emptying the bladder.

## 2019-01-06 NOTE — Procedures
Procedure: Cystoscopy  Preoperative Diagnosis: h/o cystitis, bothered with urinary urge incontinence, dysuria, pelvic pain  Postoperative Diagnosis: same, no cystitis, normal urothelium, mild bladder neck hypertension    Surgeon: Rodney Cruise  Assistant: Traci    Consent: [name] expressed understanding and acceptance of the risks, benefits, alternatives, and potential complications of the procedure including, but not limited to, infection, bleeding, pain, inability to urinate, urinary incontinence, damage to urethra or bladder, delayed urethral stricture formation, need for repeat or additional procedures.  She provided informed consent and selected to proceed.    Operative Course:   With the assistance of a female nurse chaperone, she was positioned in dorsal lithotomy.  Genitalia were prepped and draped in the usual fashion.    Cystoscope advanced per urethra under direct vision.    Urethra had no urethral stricture.    There was not urethral diverticulum.    There is mild urethral atrophy.    Bladder neck is narrow or hypertonic in appearance.  There was not bladder neck contracture.    The bladder was easily gained.  Pan-cystoscopy was performed.    Ureteral orifices were in the orthotopic positions bilaterally.    The LEFT ureter did efflux clear urine.  The RIGHT ureter did efflux clear urine.    There was trabeculation.    Trabeculation Grade: 1    There were not bladder diverticulae(um).    There were not urothelial lesions.    There were not intravesical lesions.    The bladder was left full and the cystoscope withdrawn.    There were no complications, blood loss was minimal, and the procedure was well-tolerated.    Pelvic Exam:  Vaginal atrophy severity: mild    Urethral mobility: grade 1  Cystocele: grade 1  Enterocele/Vault: grade 1  Rectocele: grade 1    Stress Incontinence: absent    Uroflow, noninvasive: Qmax: 11 mL/sec, Voided Volume 210 mL    PVR by ultrasound: 28 mL

## 2019-01-08 ENCOUNTER — Ambulatory Visit: Payer: MEDICARE

## 2019-01-09 NOTE — Telephone Encounter
Received call back from pt's daughter Thersa Salt) regarding pt's medical and pharmacy coverage. Pt previously have Plan D through [facility] as well as Plan J through [employer]. Jacqulyn Cane also has a call with a representative from Medicare at 4:30PM for additional insight. Advised Virigina that [employer] does have Medicare Advantage Plans (Part C) if needed, but noted that pt is on IVIG and also consider Soliris (eculizumab) for her MG treatment. Given that these are usually infusions, pt would likely need additional medical coverage since these are not typically available through pharmacy benefits. [place] may contact PharmD for additional insight if needed.

## 2019-01-12 ENCOUNTER — Telehealth: Payer: BLUE CROSS/BLUE SHIELD

## 2019-01-12 NOTE — Telephone Encounter
Left message for pt to call me back to schedule procedure with Dr. Rodney Cruise

## 2019-01-13 ENCOUNTER — Telehealth: Payer: BLUE CROSS/BLUE SHIELD

## 2019-01-13 ENCOUNTER — Telehealth: Payer: MEDICARE

## 2019-01-13 NOTE — Telephone Encounter
Call Back Request    MD:  Molinda Bailiff., MD    Reason for call back: Gwenette Greet from [facility] said that they got the order for a overnight oximetry kit and they are trying to process but its being rejected due to its missing signature date from Dr. Mindi Slicker. Will also be faxing in the prefilled form from [facility], if able to complete before receiving fax it can be sent to [phone] fax. Also mentioned that pt's daughter is very anxious to get this, asked for high priority. Thank you.     Any Symptoms:  []  Yes  [x]  No      ? If yes, what symptoms are you experiencing:    o Duration of symptoms (how long):    o Have you taken medication for symptoms (OTC or Rx):      Patient or caller has been notified of the 24-48 hour turnaround time.

## 2019-01-13 NOTE — Telephone Encounter
Spoke to daughter this morning. Stated that her mom would like to wait a while due to the [place]. They will call me back to scheduled

## 2019-01-13 NOTE — Telephone Encounter
Forwarded by: [name]      Good afternoon Dr. Mindi Slicker,    Are you ok with me sending orders with an electronic signature so we can facilitate this ASAP?    [place]

## 2019-01-14 ENCOUNTER — Ambulatory Visit: Payer: BLUE CROSS/BLUE SHIELD

## 2019-01-14 ENCOUNTER — Ambulatory Visit: Payer: MEDICARE

## 2019-01-14 DIAGNOSIS — J449 Chronic obstructive pulmonary disease, unspecified: Secondary | ICD-10-CM | POA: Diagnosis not present

## 2019-01-15 ENCOUNTER — Telehealth: Payer: BLUE CROSS/BLUE SHIELD

## 2019-01-15 NOTE — Telephone Encounter
Call Back Request    MD:  Jefm Bryant     Reason for call back: The pt is calling to notify the team that she received the authorization for her IVIG from her coverage and would like to know when they can begin the process and if the office needs the letter she received.   Please advise, thank you.   Pt also provided the phone number for insurance : [phone]    Any Symptoms:  []  Yes  [x]  No      ? If yes, what symptoms are you experiencing:    o Duration of symptoms (how long):    o Have you taken medication for symptoms (OTC or Rx):      Patient or caller has been notified of the 24-48 hour turnaround time.

## 2019-01-16 ENCOUNTER — Ambulatory Visit: Payer: MEDICARE

## 2019-01-16 ENCOUNTER — Ambulatory Visit: Payer: Medicare Other | Admitting: Neurology

## 2019-01-17 ENCOUNTER — Ambulatory Visit: Payer: MEDICARE

## 2019-01-17 NOTE — Telephone Encounter
Call Back Request    MD:  Dr. Jefm Bryant     Reason for call back: Pt's daughter, [place], called to f/u in regards to IVIG treatment. Please assist at earliest convenience. Thank you.     CBN: [phone]    Any Symptoms:  []  Yes  [x]  No      ? If yes, what symptoms are you experiencing:    o Duration of symptoms (how long):    o Have you taken medication for symptoms (OTC or Rx):      Patient or caller has been notified of the 24-48 hour turnaround time.

## 2019-01-19 NOTE — Telephone Encounter
PDL Call to Practice    Reason for Call: Pt called back in regards to the previous messages. She states she keeps getting calls from a facility but does not have any details about IVIG treatment.   MD: Dr. Jefm Bryant    Appointment Related?  [x]  Yes  []  No     If yes;  Date:  Time:    Call warm transferred to PDL: [x]  Yes  []  No    Call Received by Practice Representative: Ladona Mow

## 2019-01-19 NOTE — Telephone Encounter
Pt calling to speak to someone about getting IVIG treatment setup. Pt would like to know if Dr. Jefm Bryant takes hardship waiver? She wants to know will her insurance be paying for the medication, treatment, and home health nurse and where should she go or call to get IVIG appt setup? Please assist.    CB# [phone]

## 2019-01-20 ENCOUNTER — Telehealth: Payer: BLUE CROSS/BLUE SHIELD

## 2019-01-20 NOTE — Telephone Encounter
Message to Practice/Provider    MD: Molinda Bailiff., MD      Message: Gwenette Greet with [facility] called to inform MD that they evaluated the patient last night to see if the patient needs oxygen, and she does need it. Per Gwenette Greet she faxed over a pre order form for MD to sign. If Dr. Mindi Slicker has any question he can call Gwenette Greet at [phone]    Thank you      Return call is not being requested by the patient or caller.    Patient or caller has been notified of the 24-48 hour processing turnaround time if applicable.

## 2019-01-20 NOTE — Telephone Encounter
Rx orders received from [facility], will have Dr. Jefm Bryant sign and fax back to start Home infusion therapy.

## 2019-01-21 NOTE — Telephone Encounter
Will place on desk / office  [place] may have done so

## 2019-01-21 NOTE — Telephone Encounter
Forms received  Will give to MD

## 2019-01-22 NOTE — Telephone Encounter
Signed and faxed

## 2019-01-27 ENCOUNTER — Telehealth: Payer: MEDICARE

## 2019-01-27 NOTE — Telephone Encounter
Reply by: [name]      Thank you [place].    [place]

## 2019-01-28 NOTE — Telephone Encounter
Called patient to convert upcoming IN clinic appointment to a telehealth option. NO answer. LVM advising patient to call back if we could change the appointment.

## 2019-01-28 NOTE — Telephone Encounter
Message to Practice/Provider    MD: Dr. Jefm Bryant     Message: Daughter returned call, apt has been changed from in-person to [place].     Return call is not being requested by the patient or caller.    Patient or caller has been notified of the 24-48 hour processing turnaround time if applicable.

## 2019-02-03 ENCOUNTER — Telehealth: Payer: MEDICARE | Attending: Neurology

## 2019-02-03 DIAGNOSIS — G7001 Myasthenia gravis with (acute) exacerbation: Secondary | ICD-10-CM

## 2019-02-03 DIAGNOSIS — M4802 Spinal stenosis, cervical region: Secondary | ICD-10-CM

## 2019-02-03 DIAGNOSIS — J449 Chronic obstructive pulmonary disease, unspecified: Secondary | ICD-10-CM | POA: Diagnosis not present

## 2019-02-03 DIAGNOSIS — G7000 Myasthenia gravis without (acute) exacerbation: Secondary | ICD-10-CM | POA: Diagnosis not present

## 2019-02-03 DIAGNOSIS — J45909 Unspecified asthma, uncomplicated: Secondary | ICD-10-CM | POA: Diagnosis not present

## 2019-02-03 DIAGNOSIS — M797 Fibromyalgia: Secondary | ICD-10-CM | POA: Diagnosis not present

## 2019-02-03 DIAGNOSIS — Z452 Encounter for adjustment and management of vascular access device: Secondary | ICD-10-CM | POA: Diagnosis not present

## 2019-02-03 NOTE — Progress Notes
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     Interval Events:    Last visit 12/23/18  Today she presents via telemedicine visit. Since last visit, patient states she feels ''bad.'' She has difficulty walking with dragging her feet and stumbling. Her last fall was 3 weeks ago. Vision remains blurry but does not see double. Left eye remains droopy. She feels SOB and uses NC O2 at night b/c of her COPD. Denies dysphagia. She notes she had the most improvement in speech and strength with PLEX as compared to IVIg.    She has not been doing PT because of covid risk.  Dr. Lorenz Coaster urologist recommending sacral neuromodulation for urinary frequency.    Current Regimen:  Imuran 75 mg BID  Mestinon 60 mg TID  IVIG 60 g Q 3w (receiving today, last received Sept)    Previously Failed  Prednisone - oral ulcers    Past Medical History:  Fibromyalgia    Breast cancer (2017) - contained, no tx   Interstitial cystitis x 30 years   Fall with brief LOC (09/2016)  Fall with knee infection/contusion (10/2018)  Depression and anxiety   Diverticulitis   COPD  Asthma     Past Surgical History:  Meniscus tear repair in left knee    Family History:   Adopted at birth. Birth mother may have had breast cancer.   Brother(58) and sister (30) were both adopted from different families    Social History:   She moved from her independent living place in [place], [place] to [place] with her daugther. Pt  reports that she quit smoking about 35 years ago. She has quit using smokeless tobacco. Smoked 0.5 PPD from 1960 -1985. Denies EtOH or recreational drug use. Retired, college-educated, widowed.     Allergies:  Allergies   Allergen Reactions   ? Beta Adrenergic Blockers Other (See Comments)     Avoid d/t Myasthenia Gravis  Avoid d/t Myasthenia Gravis     ? Macrolides And Ketolides Other (See Comments)     Avoid d/t Myasthenia Gravis  Use with caution d/t Myasthenia Gravis  Use with caution d/t Myasthenia Gravis     ? Sulfa Antibiotics Other (See Comments)     May possibly have caused deafness in one ear  May possibly have caused deafness in one ear  other     ? Botulinum Toxins Other (See Comments)     Muscle weakness r/t Myasthenia Gravis  Muscle weakness r/t Myasthenia Gravis     ? Statins Other (See Comments)     Muscle aches  Muscle aches  Muscle aches     ? Plastic Tape Itching and Other (See Comments)     Turns the skin red where applied        Medications:   Calcium carbonate 500mg    Azathioprine 75mg  BID   Mestinon 60mg  TID   ClonazepaM 1mg  qhs  Tylenol 325mg  PRN prior to infusions  Albuterol 108 (90 base) MCG/ACT inhaler  Montelukast 10 mg PRN  Trimethoprim 100mg  qd   Dexlansoprazole 60 mg qd  Methylcellulose oral power 1packet   Meclizine 12.5 mg TID PRN for dizziness  Buproprion 200mg  qd     Physical Exam  24 hour vitals:    There were no vitals taken for this visit.     Limited exam by video    Gen: NAD, well-appearing, cooperative, appears younger than stated age.  Resp:  Able to count to 22 on a single breath.     Mental Status: AOx3, speech fluent with mild dysarthryia  CN: left eye ptosis at baseline without worsening with repetitive eye closure, + blurry vision. Full range of EOMI. Face symmetric to cheek puff.  Motor: able to lift all extremities antigravity Gait: unable to stand from seated position without use of her arm. Able to stand on tip toes. Posture is stooped with short stride length spastic gait.    Previous reflexes we    Neurological Exam  Mental Status  Awake, alert and oriented to person, place and time. Recalls 3 of 3 objects immediately. At 5 minutes recalls 3 of 3 objects. Speech is normal. Language is fluent with no aphasia. Attention and concentration are normal. Fund of knowledge is appropriate for level of education.    Cranial Nerves  CN II: Visual fields full to confrontation. Blurry vision but no double vision.  CN III, IV, VI: Extraocular movements intact bilaterally.  CN V: Facial sensation is normal.  CN VII: Full and symmetric facial movement.  CN VIII: Hearing is normal.  CN IX, X: Palate elevates symmetrically. Normal gag reflex.  CN XI: Shoulder shrug strength is normal.  CN XII: Tongue midline without atrophy or fasciculations.  No left ptosis .    Motor  Normal muscle bulk throughout. No fasciculations present. Normal muscle tone.                                             Right                     Left  Neck flexion                           4+                          4+  Neck extension                      4+                          4+   Shoulder abduction               4                          4  Elbow flexion                         4+                          4+  Elbow extension  4+                          4+  Wrist flexion                           4+                          4+  Wrist extension                      4+                          4+  Hip flexion                              4                          4  Knee flexion                           5                          3  Knee extension                      5                          5  Plantarflexion                         5                          5  Dorsiflexion                            5                          5    Sensory Light touch is normal in upper and lower extremities.     Reflexes                                           Right                      Left  Brachioradialis                    2+                         2+  Biceps                                 2+                         2+  Triceps                                3+                         3+  Patellar                                3+                         3+  Achilles                                2+                         2+  Plantar                           Downgoing                Downgoing    Right pathological reflexes: Hoffmann's absent. Crossed adductor present. Ankle clonus absent.  Left pathological reflexes: Hoffmann's absent. Crossed adductor present. Ankle clonus absent.    Coordination  Right: Finger-to-nose normal. Rapid alternating movement normal. Heel-to-shin normal.  Left: Finger-to-nose normal. Rapid alternating movement normal. Heel-to-shin normal.  Mild bilateral intention tremors..    Gait  Casual gait: Unable to rise from chair without using arms.  Walks slowly with stooped posture and spatic left leg. .    Labs   General Labs:     Hemoglobin   Date Value Ref Range Status   12/17/2018 12.2 11.6 - 15.2 g/dL Final     Hematocrit   Date Value Ref Range Status   12/17/2018 36.9 34.9 - 45.2 % Final     White Blood Cell Count   Date Value Ref Range Status   12/17/2018 8.75 4.16 - 9.95 x10E3/uL Final     Platelet Count, Auto   Date Value Ref Range Status   12/17/2018 420 (H) 143 - 398 x10E3/uL Final     Mean Corpuscular Hemoglobin   Date Value Ref Range Status   12/17/2018 33.8 (H) 26.4 - 33.4 pg Final     Mean Corpuscular Volume   Date Value Ref Range Status   12/17/2018 102.2 (H) 79.3 - 98.6 fL Final        Sodium   Date Value Ref Range Status   12/17/2018 141 135 - 146 mmol/L Final     Potassium   Date Value Ref Range Status   12/17/2018 4.1 3.6 - 5.3 mmol/L Final     Chloride   Date Value Ref Range Status 12/17/2018 101 96 - 106 mmol/L Final     Total CO2   Date Value Ref Range Status   12/17/2018 26 20 - 30 mmol/L Final     Urea Nitrogen   Date Value Ref Range Status   12/17/2018 17 7 - 22 mg/dL Final     Creatinine   Date Value Ref Range Status   12/17/2018 1.13 0.60 - 1.30 mg/dL Final     Calcium   Date Value Ref Range Status   12/17/2018 9.8 8.6 - 10.4 mg/dL Final  Glucose   Date Value Ref Range Status   12/17/2018 93 65 - 99 mg/dL Final      Urinalysis    UA Specimen Comment    Urine Color Lab Results  (Last 360 days)    Result      Dark Yellow  12/17/18 1549         Specific Gravity IRIS Lab Results  (Last 360 days)    Result      1.033  12/17/18 1549         PH Urine Lab Results  (Last 360 days)    Result      5.5  12/17/18 1549         Blood Urine Lab Results  (Last 360 days)    Result      Trace  12/17/18 1549         Bilirubin Urine Lab Results  (Last 360 days)    Result      Negative  12/17/18 1549         Ketones Urine Lab Results  (Last 360 days)    Result      1+  12/17/18 1549         Glucose Urine Lab Results  (Last 360 days)    Result      Negative  12/17/18 1549         Protein Urine Lab Results  (Last 360 days)    Result      1+  12/17/18 1549         Nitrite Urine Lab Results  (Last 360 days)    Result      Negative  12/17/18 1549         Leukocyte Esterase Urine Lab Results  (Last 360 days)    Result      Negative  12/17/18 1549         RBCS Urine Lab Results  (Last 360 days)    Result      0  cells/uL 12/17/18 1549         WBCS Urine Lab Results  (Last 360 days)    Result      18  cells/uL 12/17/18 1549         Squamous Epithelial Cells Lab Results  (Last 360 days)    Result      7  cells/uL 12/17/18 1549         Transitional Epithelial Cells    Urine Comment      ?  Labs 08/26/2015: AChR binding (0.63) blocking (28) antibody, ESR 3, CRP 4.9    Imaging and Diagnostics     Repetitive Nerve stimulation in office 12/23/2018: No decrement after stimulation of CN XI and left ulnar nerve (from neurologist Dr. Eliane Decree note)  MRI/MRA head 09/09/2015:  This MR angiogram of the intracerebral arteries shows the following:  1. ??Very minimal atherosclerotic change within the left posterior cerebral artery that is unlikely to be clinically significant.  2. ??There is a normal variant with the left posterior cerebral artery obtaining its flow from the anterior circulation.  3. ???No aneurysms are seen.  4. ???No new findings compared to the 11/25/2003 MRA. ??  ?  MRI orbit wwo contrast 09/09/2015: This is is a normal MRI of the orbits with and without contrast.    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 Uncovertebral joint osteophytes cause severe right C3-C4,   severe right and moderate left C5-C6 and severe left and mild right C6-C7 neural foraminal narrowing. Facet joint degeneration is severe on the left at C2-C3, the right at C3-C4 and C4-C5, and is moderate bilateral and C7-T1.     ASSESSMENT & PLAN:  Mrs. Darwish is a 83 years old female with a pmhx of seropositive initially ocular (2017) now generalized myasthenia gravis (AchR binding and blocking +), breast cancer, interstitial cystitis, fibromyalgia, COPD, asthma, recently admitted in early 11/2018 with MG exacerbation s/p PLEX and found to have UTI, who is following up for management of MG.     She was diagnosed with seropositive MG (AChR binding (0.63) blocking (28) antibodies) in 2017 and had been stable on Mestinon 60mg  TID, Azathioprine 75mg  BID and IVIG 1g/kg q3w for the past 2 years with recent lapse in IVIG dose due to transferring care to [place]. She continues to endorse left eye ptosis, dysarthria, and generalized weakness that has not changed significantly since last visit. She believes she has had the most improvement in her symptoms except her gait, when she received PLEX (10/1-10/10/2018) and less so compared to IVIg. Patient is scheduled for IVIg today and will monitor her response before deciding whether to continue treatment or consider PLEX vs solaris. Suspect that her weakness and gait difficulty is in part multifactorial secondary to dyspnea related COPD and cervical myelopathy causing gait spasticity and urinary urgency, as well as fibromyalgia pain which may put an upper limit on her baseline recovery.    Plan   - Continue Mestinon 60 mg TID and Azathioprine 75 mg BID   - continue IVIg 60g (1g/kg) Q 3 weeks ([facility]) and monitor response  - PT when covid safe.   - Referral to ortho rehab for cervical stenosis     RTC in 3 months. Patient discussed with attending, Dr. Johnanna Schneiders, MD  Neuromuscular Medicine Fellow

## 2019-02-04 DIAGNOSIS — M797 Fibromyalgia: Secondary | ICD-10-CM | POA: Diagnosis not present

## 2019-02-04 DIAGNOSIS — J449 Chronic obstructive pulmonary disease, unspecified: Secondary | ICD-10-CM | POA: Diagnosis not present

## 2019-02-04 DIAGNOSIS — J45909 Unspecified asthma, uncomplicated: Secondary | ICD-10-CM | POA: Diagnosis not present

## 2019-02-04 DIAGNOSIS — Z452 Encounter for adjustment and management of vascular access device: Secondary | ICD-10-CM | POA: Diagnosis not present

## 2019-02-04 DIAGNOSIS — G7000 Myasthenia gravis without (acute) exacerbation: Secondary | ICD-10-CM | POA: Diagnosis not present

## 2019-02-05 ENCOUNTER — Ambulatory Visit: Payer: MEDICARE | Attending: Neurology

## 2019-02-05 ENCOUNTER — Ambulatory Visit: Payer: BLUE CROSS/BLUE SHIELD

## 2019-02-10 ENCOUNTER — Ambulatory Visit: Payer: BLUE CROSS/BLUE SHIELD

## 2019-02-10 ENCOUNTER — Telehealth: Payer: Self-pay | Admitting: *Deleted

## 2019-02-10 NOTE — Telephone Encounter (Signed)
Received call from pt stating she would like to cancel up coming apt and does not want to reschedule at this time.  Pt states she will call back at another time to reschedule.

## 2019-02-13 ENCOUNTER — Ambulatory Visit: Payer: MEDICARE

## 2019-02-17 MED ORDER — CLONAZEPAM 1 MG PO TABS
1 mg | ORAL_TABLET | Freq: Three times a day (TID) | ORAL | 1 refills | Status: AC
Start: 2019-02-17 — End: ?

## 2019-02-17 MED ORDER — BUPROPION HCL ER (SR) 200 MG PO TB12
200 mg | ORAL_TABLET | Freq: Two times a day (BID) | ORAL | 3 refills | Status: AC
Start: 2019-02-17 — End: ?

## 2019-02-17 MED ORDER — CLONAZEPAM 1 MG PO TBDP
1 mg | ORAL_TABLET | Freq: Every evening | ORAL | 1 refills | Status: AC
Start: 2019-02-17 — End: ?

## 2019-02-24 DIAGNOSIS — J45909 Unspecified asthma, uncomplicated: Secondary | ICD-10-CM | POA: Diagnosis not present

## 2019-02-24 DIAGNOSIS — G7000 Myasthenia gravis without (acute) exacerbation: Secondary | ICD-10-CM | POA: Diagnosis not present

## 2019-02-24 DIAGNOSIS — J449 Chronic obstructive pulmonary disease, unspecified: Secondary | ICD-10-CM | POA: Diagnosis not present

## 2019-02-24 DIAGNOSIS — Z452 Encounter for adjustment and management of vascular access device: Secondary | ICD-10-CM | POA: Diagnosis not present

## 2019-02-24 DIAGNOSIS — M797 Fibromyalgia: Secondary | ICD-10-CM | POA: Diagnosis not present

## 2019-02-25 ENCOUNTER — Inpatient Hospital Stay: Payer: Medicare Other | Admitting: Hematology and Oncology

## 2019-02-26 DIAGNOSIS — M797 Fibromyalgia: Secondary | ICD-10-CM | POA: Diagnosis not present

## 2019-02-26 DIAGNOSIS — G7000 Myasthenia gravis without (acute) exacerbation: Secondary | ICD-10-CM | POA: Diagnosis not present

## 2019-02-26 DIAGNOSIS — J45909 Unspecified asthma, uncomplicated: Secondary | ICD-10-CM | POA: Diagnosis not present

## 2019-02-26 DIAGNOSIS — Z452 Encounter for adjustment and management of vascular access device: Secondary | ICD-10-CM | POA: Diagnosis not present

## 2019-02-26 DIAGNOSIS — J449 Chronic obstructive pulmonary disease, unspecified: Secondary | ICD-10-CM | POA: Diagnosis not present

## 2019-03-03 ENCOUNTER — Ambulatory Visit: Payer: MEDICARE

## 2019-03-03 DIAGNOSIS — M4802 Spinal stenosis, cervical region: Secondary | ICD-10-CM

## 2019-03-03 DIAGNOSIS — M62838 Other muscle spasm: Secondary | ICD-10-CM

## 2019-03-03 DIAGNOSIS — M7918 Myalgia, other site: Secondary | ICD-10-CM

## 2019-03-03 DIAGNOSIS — M47812 Spondylosis without myelopathy or radiculopathy, cervical region: Secondary | ICD-10-CM

## 2019-03-03 DIAGNOSIS — M47816 Spondylosis without myelopathy or radiculopathy, lumbar region: Secondary | ICD-10-CM

## 2019-03-03 NOTE — Patient Instructions
PLEASE REVIEW INFORMATION BELOW:     ? For all visits, please be ready to show your photo ID and insurance card(s), and pay any applicable co-pays that will be due at the time of visit.     ? It is your responsibility to be sure that your health insurance plan covers any lab test, radiology procedure, consultation, orthopaedic procedures which include, but not limit, injections, casting, bracing, durable medical equipment, etc. which has been recommended for you.  Contacting the Office:  ? Each physician office has a Patient Care Coordinator that will help coordinate your care. They can help with:  ? General questions  ? Relay specific questions to the doctor   ? Schedule surgery  ? Follow up on authorizations that are pending  ? Provide paperwork such as a return to work/school letter or disability forms  ? Please allow 24 hours for messages to be returned from the physician?s office.  ? There is a 7-10 business day turnaround for all forms.  ? We encourage you to sign up for [facility] to communicate directly with the office or physician online. Please ask any staff member for additional information.   Following Up with the Physician:  ? Before leaving, physician will let you know if you will need to follow-up within a certain time. If yes, make sure you schedule your follow-up appointment at the front desk or by calling the centralized scheduling center at:  ?  [phone] Orthopaedic Surgery.  ?  [phone] [facility].  ?  [phone] [facility].  ? Did your physician order any labs, imaging, or tests that should be completed before your next follow-up? If yes, please continue reading below.  Physician Order/Pre-authorization:  ? For any physician orders/pre-authorizations, please allow 5-7 business days for authorization to be obtained.  Imaging (MRI, X-Ray, CT Scan, etc) ? If you are scheduling at a [facility], please call Radiology at [phone] to schedule your imaging. You do not need to wait for an authorization to schedule.   ? If you are scheduling outside of [place]:  ? Notify your physician?s office of your location choice, this will help in obtaining authorization for the correct facility.  ? Once you complete the imaging, obtain a copy of the report and images. Provide a copy to your physician for review.  ? Schedule a follow-up appointment to review the images, unless stated otherwise by your physician or the office.  Injections  ? If the physician recommends an injection, plan anywhere from 1-5 follow-up visits with your physician in a specific timeframe to complete the treatment plan. The follow-up visits can be as frequent as once a week until all the dosage has been administered.  ? If authorization is required, allow 5-7 business days for the office to obtain authorization.  Lab work  ? If labs are ordered, keep in mind:  ? Fasting or non-fasting?  ? How soon must they be done? (as soon as possible, one week before next appointment, a month before next appointment, etc.)  ? Do you need to schedule a follow-up visit to discuss results or did the physician say they will call you with the results?   ? If lab work is completed at a [facility], your results will automatically be sent to the physician, however allow a few days for results to be completed. Some labs require more time than others for results to finalize.  ? If lab work is completed outside a  [facility], bring a copy of the lab orders with you. After a few days, follow-up with the lab to obtain a copy of your lab results for your physician.  Physical Therapy  ? If physical therapy is recommended, notify the office of where you would prefer to seek therapy.  ? Before doing so, verify with the physical therapy location that your insurance will be accepted. ? If authorization is required, allow 5-7 business days for the office to obtain authorization.  ? If physical therapy is completed at a [facility] facility, the progress reports will automatically be sent to the physician.  ? Physical therapy requires multiple visits to your chosen facility, anywhere from 2 weeks and more depending on the nature of your case.  Nerve Conduction/EMG Study  ? If the physician ordered a Nerve Conduction or EMG Study, contact the Refer to Physician Office to schedule your appointment.

## 2019-03-03 NOTE — Consults
PHYSIATRY SPINE CENTER CONSULTATION      Consulting Physician: Odetta Pink., MD, PhD     Attending Physician: Valeria Batman, M.D.    Chief Complaint:  neck pain.    HPI:  [name] is a 84 y.o. female with history of MG who presents at the request of Odetta Pink., MD, PhD for initial consultation and evaluation for treatment options related to the patient's pain complaints.   Patient comes in today with history of neck pain for years, of gradual  onset, without inciting event/trauma.  The patient describes the Neck pain as constant and associated with activities Aching and Sharp   Also report of diffuse, generalized weakness of BLE with no dermatomal distribution, thinks related to Myasthenia Gravis  Hospitalized in October 2020 for MG exacerbation, received IVIG  Had prior MRI of L-spine, did not bring in report for review  Rated at 5/10 severity (up to 9/10 at worst), located on the bilateral without radiation to the Left arm and Right arm.    The pain is worsened by activity, and improved by rest.    The pain is not worse with sneezing/coughing/laughing.    Previous work-up has included: MRI  Has tried the following treatments: medications  Associated symptoms - No tingling, numbness, or weakness.  Denies bowel or bladder incontinence and gait instability.    In the past 3 months, the patient has not completed at least 6 weeks of conservative management including physical therapy, chiropractic care, and/or guided home exercise program.    The patient does not describe(s) any new weakness in the bilateral arm(s), however does report generalized weakness of BLE.  Patient reports no changes in bowel and bladder function.      ALLERGIES: Beta adrenergic blockers, Macrolides and ketolides, Sulfa antibiotics, Botulinum toxins, Statins, and Plastic tape    CURRENT MEDICATIONS:   Current Outpatient Medications   Medication Sig ? Albuterol Sulfate AEPB Inhale Takes 1 to 2 puffs every 4 hours as needed for wheezing or SOB .   ? aluminum hydroxide-magnesium trisilicate 80-20 mg chewable tablet Chew by mouth.   ? azaTHIOprine 50 mg tablet Take 1.5 tablets (75 mg total) by mouth two (2) times daily.   ? buPROPion, SR, (WELLBUTRIN SR) 200 mg 12 hr tablet Take 1 tablet (200 mg total) by mouth two (2) times daily.   ? calcium carbonate 500 mg chewable tablet Chew 1 tablet by mouth daily Calcium Carbonate 500 mg is equivalent to 200 mg elemental Calcium .   ? clonazePAM 1 mg disintegrating tablet Take 1 tablet (1 mg total) by mouth at bedtime. Max Daily Amount: 1 mg   ? clonazePAM 1 mg tablet Take 1 tablet (1 mg total) by mouth three (3) times daily. Max Daily Amount: 3 mg   ? dexlansoprazole 60 mg DR capsule Take 1 capsule (60 mg total) by mouth daily.   ? fosfomycin 3 g powder packet Take 1 packet by mouth once (Dissolve packet in 90 mL of water. For partial package doses, give appropriate portion.) .   ? meclizine 12.5 mg tablet Take 1 tablet (12.5 mg total) by mouth three (3) times daily as needed.   ? Methylcellulose POWD 1 packet by Does not apply route as needed for.   ? metroNIDAZOLE 0.75% gel metronidazole 0.75 % topical gel   ? montelukast 10 mg tablet Take 10 mg by mouth as needed for.   ? nitrofurantoin 100 mg capsule nitrofurantoin monohydrate/macrocrystals 100 mg capsule   ?  phenazopyridine 200 mg tablet Take 1 tablet (200 mg total) by mouth three (3) times daily with meals.   ? pyridostigmine 60 mg tablet Take 1 tablet (60 mg total) by mouth three (3) times daily.   ? triamcinolone 0.1% cream Apply topically two (2) times daily.   ? trimethoprim 100 mg tablet Take 1 tablet (100 mg total) by mouth daily.   ? polyethylene glycol powder packet Take 17 g by mouth.     No current facility-administered medications for this visit.        PAST MEDICAL HISTORY: History reviewed. No pertinent past medical history. PAST SURGICAL HISTORY: History reviewed. No pertinent surgical history.    SOCIAL HISTORY:   Social History     Socioeconomic History   ? Marital status: Widowed     Spouse name: Not on file   ? Number of children: Not on file   ? Years of education: Not on file   ? Highest education level: Not on file   Occupational History   ? Not on file   Social Needs   ? Financial resource strain: Not on file   ? Food insecurity     Worry: Not on file     Inability: Not on file   ? Transportation needs     Medical: Not on file     Non-medical: Not on file   Tobacco Use   ? Smoking status: Former Smoker     Quit date: 1985     Years since quitting: 36.0   ? Smokeless tobacco: Former Estate agent and Sexual Activity   ? Alcohol use: Not on file   ? Drug use: Not on file   ? Sexual activity: Not on file   Lifestyle   ? Physical activity     Days per week: Not on file     Minutes per session: Not on file   ? Stress: Not on file   Relationships   ? Social Wellsite geologist on phone: Not on file     Gets together: Not on file     Attends religious service: Not on file     Active member of club or organization: Not on file     Attends meetings of clubs or organizations: Not on file     Relationship status: Not on file   Other Topics Concern   ? Not on file   Social History Narrative   ? Not on file       FAMILY HISTORY: No family history on file.      Review of Systems:  Consititutional:    Fevers - no   Weight change - no  Eyes:    Vision change - no  Ears, Nose, Mouth, Throat:   Headaches - no  Cardiovascular:   Chest pain - no  Respiratory:   Shortness of breath - no  Gastrointestinal:   Stool incontinence - no  Genitourinary:   Urinary incontinence - no  Integumentary:   Rashes - no  Neurological:   Weakness - No   Numbness/tingling - No  Psychiatric:   Depressed mood - no   Sleep problems - no   Anxiety - no  Musculoskeletal:   Other joint swelling - no    PHYSICAL EXAMINATION: Vital Signs: R-12 BP 172/81  ~ Pulse 92  ~ Ht 5' 4'' (1.626 m)  ~ Wt 138 lb (62.6 kg)  ~ BMI 23.69 kg/m?     General:  well developed, well nourished, and in no acute distress, alert and oriented x 4.  Cardiovascular/Extremities: Pulse intact distally with no cyanosis, clubbing, or edema.  Respiratory: Normal work of breathing without apnea and no evidence of respiratory distress without use of accessory muscles.  Abd: soft. No tenderness to palpation  Skin: no lesions or rash on trunk, feet, or hands  Lymphatic: No enlarged cervical or inguinal lymph nodes.    Joint examination: functional range of motion for all four extremities at the shoulders, elbows, wrist, hips, knees, and ankles is normal    Spine:  Cervical-spine: The cervical spine is symmetric without kyphosis or scoliosis.   Range of motion is normal in all planes and if limited range is due to pain.  There is tenderness to palpation in the bilateral cervical paraspinals.   There is not tenderness to palpation in the bilateral trapezius and levator scapulae.   Positive bilateral FACET loading.    Negative bilateral Spurling's.  Upper extremity shoulder, elbow and wrist exam and tinnels testing is normal.    Neurological: Normal gait. Sensation is intact to light touch in the upper extremities. Motor exam shows 5/5 for upper extremity muscles tested. Reflexes are symmetric at 2+ for the upper extremities. Muscle tone is normal with no clonus or muscle atrophy. Hoffman's negative.           Imaging and work-up:  Imaging was reviewed personally by me and demonstrate by reporting the following:  MRI C-Spine:  IMPRESSION: Multilevel degenerative disc disease in the setting of C4-7 congenital cervical spinal canal stenosis with effacement of the subarachnoid spaces, but  without active cord compression. Uncovertebral joint osteophytes cause severe right C3-C4, severe right and moderate left C5-C6 and severe left and mild right C6-C7 neural foraminal narrowing. Facet joint degeneration is severe on the left at C2-C3, the right at C3-C4 and C4-C5, and is moderate bilateral and C7-T1. .       Assessment and Plan:   Commentary and Medical Decision Making:    [name] is a 84 y.o. female who presents to clinic today for evaluation for neck pain.  Based on the history, physical examination and evaluation of all available imaging, I have made the following assessment.  Pt without significant neck pain or cervical radicular features.  Reporting generalized BLE weakness, however, recent ongoing MG with recent exacerbation in October my be contributing.  Requested patient upload recent MRI of L-spine to rule out lumbar spine pathology as cause of BLE weakness.  No radicular pain reported.    Assessment:  1. Chronic neck pain, no radiation  2. 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 Cervical spondylosis  4. Cervical facet arthropathy in the setting of disc degeneration and spondylosis  5. Cervical degenerative disk disease  6. Myofascial pain  7. Hx of myasthenia gravis  8. Symptoms are likely multifactorial, with a contribution of facet joint arthropathy as well as a myofascial overlay    Plan:   1. Medication(s) as noted below. Primary provider may renew medication, and the patient was advised of side effects and understood and agreed to take the medication as directed.    -Continue current pain meds.    2. Physical therapy for cervical and upper extremity evaluation to include treatment with superficial modalities as needed, such as range of motion, stretch and strengthening exercises, spine precautions, spine school, posture training, home exercise program, TENs, and aerobic exercise program. 3. If patient has continuing cervical pain following at  least 6 weeks of conservative management, including pharmacological therapy, physical therapy, and/or a home exercise program, for diagnostic and therapeutic purposes, may consider:    1. bilateral C3, C4, C5 and C6 medial branch blocks, and if successful the patient would be a candidate for radiofrequency ablation procedure.    4. Reviewed imaging with patient today and discussed findings.  Advised patient to send me report of recent L-spine MRI and upload CD images so I may review them.  Will call to discuss once complete.    5. Acupuncture referral placed, per pt request    6. Follow-up with primary care provider for other medical issues and for further management or medication refills and therapy renewals. The patient can follow-up if they would like any of the options listed above.    7. Pt advised to return to clinic for follow-up in prn.  Advised patient to contact me earlier if any questions or worsening symptoms.    Educated patient regarding complexity of back pain and multidisciplinary approach to managing symptoms, focused on active self-management strategies.  Encouraged physical activity. Advised to avoid prolonged sitting, carrying objects away from the body, and twisting maneuvers.      I advised the patient that if neurological issues were to develop such as weakness, bowel or bladder control, or worsening pain, that they should present immediately to the closest Emergency Room.    Thank you for this consultation; If I can be of further service, please contact my office at  [phone].         Raechel Chute, MD  Interventional Spine  [facility]  Dept. Of Orthopedic Surgery

## 2019-03-04 ENCOUNTER — Telehealth: Payer: MEDICARE | Attending: Geriatric Medicine

## 2019-03-04 ENCOUNTER — Ambulatory Visit: Payer: MEDICARE

## 2019-03-04 NOTE — Progress Notes
OUTPATIENT GERIATRICS CLINIC VIDEO VISIT    Patient Consent to Telehealth Questionnaire   [facility] TELEHEALTH PRECHECKIN QUESTIONS 03/04/2019   By clicking ''I Agree'', I consent to the below:  I Agree     - I agree  to be treated via a video visit and acknowledge that I may be liable for any relevant copays or coinsurance depending on my insurance plan.  - I understand that this video visit is offered for my convenience and I am able to cancel and reschedule for an in-person appointment if I desire.  - I also acknowledge that sensitive medical information may be discussed during this video visit appointment and that it is my responsibility to locate myself in a location that ensures privacy to my own level of comfort.  - I also acknowledge that I should not be participating in a video visit in a way that could cause danger to myself or to those around me (such as driving or walking).  If my provider is concerned about my safety, I understand that they have the right to terminate the visit.       Patient: [name]  MRN: [id number]  DOB: [birth year]  PCP: York Ram., MD  DATE of SERVICE: 03/04/2019    Video visit is being conducted today instead of an in-person visit due to COVID-19.   []  Visit conducted via [facility] ZOOM for the following reason(s):  []  patient/surrogate unable to access myChart video platform  []  accommodate family members that are unable to be present with the patient     Patient is accompanied by ***.     Patient is a(n) []  reliable []  unreliable historian and additional collateral information was obtained during the visit today from:       Chief complaint: No chief complaint on file.          HPI:           ALLERGY:     Allergies   Allergen Reactions   ? Beta Adrenergic Blockers Other (See Comments)     Avoid d/t Myasthenia Gravis  Avoid d/t Myasthenia Gravis     ? Macrolides And Ketolides Other (See Comments)     Avoid d/t Myasthenia Gravis  Use with caution d/t Myasthenia Gravis Use with caution d/t Myasthenia Gravis     ? Sulfa Antibiotics Other (See Comments)     May possibly have caused deafness in one ear  May possibly have caused deafness in one ear  other     ? Botulinum Toxins Other (See Comments)     Muscle weakness r/t Myasthenia Gravis  Muscle weakness r/t Myasthenia Gravis     ? Statins Other (See Comments)     Muscle aches  Muscle aches  Muscle aches     ? Plastic Tape Itching and Other (See Comments)     Turns the skin red where applied         MEDICATIONS:     Current Outpatient Medications   Medication Sig   ? Albuterol Sulfate AEPB Inhale Takes 1 to 2 puffs every 4 hours as needed for wheezing or SOB .   ? aluminum hydroxide-magnesium trisilicate 80-20 mg chewable tablet Chew by mouth.   ? azaTHIOprine 50 mg tablet Take 1.5 tablets (75 mg total) by mouth two (2) times daily.   ? buPROPion, SR, (WELLBUTRIN SR) 200 mg 12 hr tablet Take 1 tablet (200 mg total) by mouth two (2) times daily.   ? calcium carbonate 500 mg  chewable tablet Chew 1 tablet by mouth daily Calcium Carbonate 500 mg is equivalent to 200 mg elemental Calcium .   ? clonazePAM 1 mg disintegrating tablet Take 1 tablet (1 mg total) by mouth at bedtime. Max Daily Amount: 1 mg   ? clonazePAM 1 mg tablet Take 1 tablet (1 mg total) by mouth three (3) times daily. Max Daily Amount: 3 mg   ? dexlansoprazole 60 mg DR capsule Take 1 capsule (60 mg total) by mouth daily.   ? fosfomycin 3 g powder packet Take 1 packet by mouth once (Dissolve packet in 90 mL of water. For partial package doses, give appropriate portion.) .   ? meclizine 12.5 mg tablet Take 1 tablet (12.5 mg total) by mouth three (3) times daily as needed.   ? Methylcellulose POWD 1 packet by Does not apply route as needed for.   ? metroNIDAZOLE 0.75% gel metronidazole 0.75 % topical gel   ? montelukast 10 mg tablet Take 10 mg by mouth as needed for.   ? nitrofurantoin 100 mg capsule nitrofurantoin monohydrate/macrocrystals 100 mg capsule ? phenazopyridine 200 mg tablet Take 1 tablet (200 mg total) by mouth three (3) times daily with meals.   ? polyethylene glycol powder packet Take 17 g by mouth.   ? pyridostigmine 60 mg tablet Take 1 tablet (60 mg total) by mouth three (3) times daily.   ? triamcinolone 0.1% cream Apply topically two (2) times daily.   ? trimethoprim 100 mg tablet Take 1 tablet (100 mg total) by mouth daily.     No current facility-administered medications for this visit.          SOCIAL HISTORY:     Social History     Social History Narrative   ? Not on file         PHYSICAL EXAM:     There were no vitals taken for this visit.    Gen []  NAD     Eyes []  Conj/Lids wnl  []   Sclerae non-icteric   []  EOMI     ENT []  Oropharynx clear  []  Hearing appears adequate  []  Hearing appears impaired     Neck []  Inspect     Resp []  Unlabored []  No accessory muscle use       CV []  Edema     Lymph      GI []  No obvious tenderness when palpated by self/caregiver  []  Appears distended     Neuro []  No facial droop   []   No pronator drift   []  Finger to nose intact     MS []  Gait  []  ROM  []  Balance []  No tremors     Skin []  Inspection     Psych []  Insight/judgment  []  Affect  []  Cognition             LABS and STUDIES:     I have:   []  Reviewed []  1 []  2 []  ? 3 unique laboratory, radiology, and/or diagnostic tests noted below    Test/Study: *** on date ***.    []  Reviewed []  1 []  2 []  ? 3 prior external notes and incorporated into patient assessment    I reviewed Dr. Marland Kitchens note in specialty *** from date ***.    []  Discussed management or test interpretation with external provider(s) as noted      []  Ordered []  1 []  2 []  ? 3 unique laboratory, radiology, and/or diagnostic tests noted below in A&P  Labs:  Lab Results   Component Value Date    WBC 8.75 12/17/2018    HGB 12.2 12/17/2018    MCV 102.2 (H) 12/17/2018    PLT 420 (H) 12/17/2018     Lab Results   Component Value Date    NA 141 12/17/2018    K 4.1 12/17/2018    CREAT 1.13 12/17/2018 GFRESTNOAA 46 12/17/2018    CALCIUM 9.8 12/17/2018     Lab Results   Component Value Date    ALT 11 12/17/2018    AST 20 12/17/2018    ALKPHOS 61 12/17/2018    BILITOT 0.2 12/17/2018    ALBUMIN 4.3 12/17/2018     Lab Results   Component Value Date    TSH 1.00 12/17/2018      No results found for: HGBA1C  No results found for: CHOL, CHOLDLCAL  No results found for: CHOLDLQ  No results found for: VITD25OH  No results found for: BNP      Studies:      ASSESSMENT and PLAN:     #Neck pain--will get [place] consult after COVID  #Macrocytosis--check B12, folic acid after COVID  #Deconditioning--initiate home PT  #GERD--s/p Nisan fundoplication  #COPD--mixed--per PFT's from [name].--consider reinitiation of inhalers.  #s/p rectocele repair  #s/p cholecystectomy  #Nocturnal hypoxemia--presumably related to MG crisis but has history of COPD. ?Will order nocturnal home O2 study and consider PFT's/Pulmonary evaluation once settled down. ?May need to restart BREO inhaler.  #Myasthenia Gravis--s/p plasmapheresis x5 October 2020, no Thymoma, on Azathioprine 150 daily and mestinon 60 tid. ?Saw Dr. Enedina Finner at [facility]. ?Was considering Soliris.  Ordering IVIG and MRI  #IBS--titrate miralax to comfort. ?Consider duloxetine substitute for buproprion.  #Interstitial cystitis--on daily Trimethoprim 100 for UTI prophylaxis. ?Using bladder instillations prn. OFF topical estrogen due to advice from neurologist re: MG.  #Major Depression--active. ?Likely to add or substitute duloxetine  #Allergic rhinitis--previously on nasal steroids.  #Breast CA--DCIS?by report--obtain mammo. ?Patient declined surgery in 2019 and experienced dry mouth from Tamoxifen. ?Was considering Anastrozole.  Per records from [facility]: ''07/20/2017 Screening detected right breast calcifications and distortion. The distortion was in the upper outer quadrant: Biopsy DCIS intermediate grade with CSL; calcifications UIQ: 2.8 cm: Biopsy DCIS+ ALH +CSL; 4 cm apart, axilla negative, Tis NX stage 0''  #DM with CKD--stabe 3b--based on labs from [name]. ?FOllow here. ?If BP permits, may consider ARB BUT patient with h/o angioedema so will ONLY initiate this agent with great caution.  #OA knees--previously getting hyaluronic acid injections.  #Anxiety  #Benzodiazepine dependence--will work to wean over time.  #Fibromyalgia--duloxetine as above. ?Trial PT  #deafness in one ear--will schedule Audiology once COVID concerns less.  #Vision--needs ophthalmology visit after COVID  #Polypharmacy  #Statin intolerance    # advice given about precautions for coronavirus    # advanced care planning/goals of care discussion.         INTERACTION COMPLEXITY and SOCIAL DETERMINANTS of HEALTH     []  New problem with uncertain diagnosis  []  New problem that poses threat to life or bodily function      INTERACTION COMPLEXITY  []  Discussion with alternate (proxy) if patient with impaired communication / comprehension ability (e.g., dementia, aphasia, severe hearing loss).    []  Repeated questions (or disagreement) between patient and/among caregivers/family during the visit.    []  Caregiver/patient emotions/behavior/beliefs interfering with implementation of treatment plan.      SDoH  The diagnosis or treatment of said conditions is significantly limited  by the following social determinants of health:  []  Z59.0 Homelessness  []  Z59.1 Inadequate housing  []  Z59.2 Discord with neighbors, lodgers and landlord  []  Z60.2 Problems related to living alone  []  Z59.8 Other problems related to housing and economic circumstances  []  Z59.4 Lack of adequate food and safe drinking water  []  Z59.6 Low income  []  Z59.7 Insufficient social insurance and welfare support []  Z59.9 Problems related to housing and economic circumstances, unspecified  []  Z75.3 Unavailability and inaccessibility of health care facilities  []  Z75.4 Unavailability and inaccessibility of other helping agencies  []  Z72.4 Inappropriate diet and eating habits  []  Z62.820 Parent-biological child conflict  []  Z63.8 Other specified problems related to primary support group  []  Z55.0 Illiteracy and low level literacy   []  Z56.9 Unspecified problems related to employment        FOLLOW-UP     RTC     No future appointments.      The above plan of care, diagnosis, orders, and follow-up were discussed with the patient and/or surrogate. Questions related to this recommended plan of care were answered.      If Billing Based on Time:     I performed the following items on the day of service:    []  Preparing to see the patient (e.g., review of tests)  []  Obtaining and/or reviewing separately obtained history   []  Performing a medically appropriate examination and/or evaluation   []  Counseling and educating the patient/family/caregiver   []  Ordering medications, tests, or procedures  []  Referring and communicating with other healthcare professionals (when not separately reported)  []  Documenting clinical information in the EHR  []  Independently interpreting results and communicating results to patient/family/caregiver    I spent the following total amount of time on these tasks on the day of service:  New Patient     Established Patient  []  15-29 minutes ? 47829    []  up to 9 minutes ? 99211  []  30-44 minutes ? 56213     []  10-19 minutes ? 99212   []  45-59 minutes ? 08657      []  20-29 minutes ? 99213   []  60-74 minutes ? 84696   []  30-39 minutes ? 29528         []  40-55 minutes ? 41324    []  I spent an additional *** 15-minute-increment(s) for a total of *** minutes on these tasks on the day of service. [phone] for each additional 15 minutes.)      Clinical Frailty Scale:    Patient's score: [None of the following criteria are required at this time due to CoVID-19.]  []   This patient is an established patient in my practice.   []   This patient lives in [place].  []   I am a licensed physician in the state of [place].       Signed: York Ram, MD 03/04/2019

## 2019-03-05 ENCOUNTER — Ambulatory Visit: Payer: MEDICARE

## 2019-03-05 DIAGNOSIS — J449 Chronic obstructive pulmonary disease, unspecified: Secondary | ICD-10-CM | POA: Diagnosis not present

## 2019-03-05 DIAGNOSIS — G7000 Myasthenia gravis without (acute) exacerbation: Secondary | ICD-10-CM | POA: Diagnosis not present

## 2019-03-05 DIAGNOSIS — J45909 Unspecified asthma, uncomplicated: Secondary | ICD-10-CM | POA: Diagnosis not present

## 2019-03-05 DIAGNOSIS — M797 Fibromyalgia: Secondary | ICD-10-CM | POA: Diagnosis not present

## 2019-03-05 DIAGNOSIS — Z452 Encounter for adjustment and management of vascular access device: Secondary | ICD-10-CM | POA: Diagnosis not present

## 2019-03-06 ENCOUNTER — Ambulatory Visit: Payer: BLUE CROSS/BLUE SHIELD

## 2019-03-09 ENCOUNTER — Ambulatory Visit: Payer: MEDICARE

## 2019-03-10 MED ORDER — BUPROPION HCL ER (SR) 200 MG PO TB12
200 mg | ORAL_TABLET | Freq: Two times a day (BID) | ORAL | 3 refills | Status: AC
Start: 2019-03-10 — End: 2019-03-10

## 2019-03-10 MED ORDER — BUPROPION HCL ER (SR) 200 MG PO TB12
200 mg | ORAL_TABLET | Freq: Two times a day (BID) | ORAL | 3 refills | Status: AC
Start: 2019-03-10 — End: ?

## 2019-03-11 DIAGNOSIS — Z23 Encounter for immunization: Secondary | ICD-10-CM | POA: Diagnosis not present

## 2019-03-12 ENCOUNTER — Ambulatory Visit: Payer: BLUE CROSS/BLUE SHIELD

## 2019-03-13 ENCOUNTER — Telehealth: Payer: BLUE CROSS/BLUE SHIELD

## 2019-03-13 ENCOUNTER — Telehealth: Payer: MEDICARE

## 2019-03-13 DIAGNOSIS — M545 Low back pain: Secondary | ICD-10-CM

## 2019-03-13 MED ORDER — TRIMETHOPRIM 100 MG PO TABS
100 mg | ORAL_TABLET | Freq: Every day | ORAL | 3 refills | 60.00000 days | Status: AC
Start: 2019-03-13 — End: ?

## 2019-03-13 NOTE — Telephone Encounter
Faxed out Clinical Notes from 12/08 to [place]:

## 2019-03-13 NOTE — Telephone Encounter
Outpatient Geriatric Telephone Visit     Patient: [name]  MRN: [id number]  PCP: Molinda Bailiff., MD  Date: 03/13/2019      Patient's concerns:  Ran out of Trimethoprim about a week ago.  Since then, noted soreness in lower back.    No change in urine  No foul smell        Assessment and Plan:  Back pain  Problems:  Back pain    Plan:  Doubt pyelo  Trial restarting trimethoprim and monitoring.        # advice given about precautions for coronavirus    Future Appointments   Date Time Provider [place]   04/08/2019  1:40 PM Molinda Bailiff., MD GERI IMS 420 MEDICINE         Total time spent on this telephone encounter:  []   5-10 minutes (99441)  []   11-20 minutes (14431)  [x]   >21 minutes (54008)      [All the following must be checked off in order to bill for this service.]    [x]   This service is not related to a problem addressed by an E/M encounter in the prior 7 days  [x]   This service will not result in an office or telemedicine visit within the next 24 hours or soonest available appointment.   [x]   This service was initiated by the patient.   [x]   This patient is an established patient in my practice.     The patient has been notified that this visit will result in an E/M charge.    Signed: Molinda Bailiff, MD

## 2019-03-17 ENCOUNTER — Ambulatory Visit: Payer: MEDICARE

## 2019-03-17 DIAGNOSIS — Z23 Encounter for immunization: Secondary | ICD-10-CM

## 2019-03-17 DIAGNOSIS — M797 Fibromyalgia: Secondary | ICD-10-CM | POA: Diagnosis not present

## 2019-03-17 DIAGNOSIS — J449 Chronic obstructive pulmonary disease, unspecified: Secondary | ICD-10-CM | POA: Diagnosis not present

## 2019-03-17 DIAGNOSIS — J45909 Unspecified asthma, uncomplicated: Secondary | ICD-10-CM | POA: Diagnosis not present

## 2019-03-17 DIAGNOSIS — Z452 Encounter for adjustment and management of vascular access device: Secondary | ICD-10-CM | POA: Diagnosis not present

## 2019-03-17 DIAGNOSIS — G7000 Myasthenia gravis without (acute) exacerbation: Secondary | ICD-10-CM | POA: Diagnosis not present

## 2019-03-18 DIAGNOSIS — M797 Fibromyalgia: Secondary | ICD-10-CM | POA: Diagnosis not present

## 2019-03-18 DIAGNOSIS — Z452 Encounter for adjustment and management of vascular access device: Secondary | ICD-10-CM | POA: Diagnosis not present

## 2019-03-18 DIAGNOSIS — J45909 Unspecified asthma, uncomplicated: Secondary | ICD-10-CM | POA: Diagnosis not present

## 2019-03-18 DIAGNOSIS — G7000 Myasthenia gravis without (acute) exacerbation: Secondary | ICD-10-CM | POA: Diagnosis not present

## 2019-03-18 DIAGNOSIS — J449 Chronic obstructive pulmonary disease, unspecified: Secondary | ICD-10-CM | POA: Diagnosis not present

## 2019-03-23 ENCOUNTER — Telehealth: Payer: BLUE CROSS/BLUE SHIELD

## 2019-03-23 NOTE — Telephone Encounter
Call Back Request    MD:  Jefm Bryant   Reason for call back:  The pt is calling in regards to scheduling a follow up with Dr.Shieh.   Per the pt she would like to schedule for the afternoon or late morning. Please see doctors note: Return in about 3 months (around 05/04/2019).    The pt also has a question in regards to a recent nose bleed she had after her infusion. The pt stated she would like to know if it is correlated to her IVIG.  Please assist, thank you.     Any Symptoms:  []  Yes  [x]  No      ? If yes, what symptoms are you experiencing:    o Duration of symptoms (how long):    o Have you taken medication for symptoms (OTC or Rx):      Patient or caller has been notified of the 24-48 hour turnaround time.

## 2019-03-23 NOTE — Telephone Encounter
Call Back Request    MD:   Jefm Bryant     Reason for call back: The pt is calling in regards to previous message.  Please assist, thank you.    Any Symptoms:  []  Yes  [x]  No      ? If yes, what symptoms are you experiencing:    o Duration of symptoms (how long):    o Have you taken medication for symptoms (OTC or Rx):      Patient or caller has been notified of the 24-48 hour turnaround time.

## 2019-03-25 NOTE — Telephone Encounter
Called pt to schedule next available f/u appt with Dr. Shieh (MG clinic); there was no answer, left a v/m

## 2019-03-26 ENCOUNTER — Telehealth: Payer: MEDICARE

## 2019-03-26 NOTE — Telephone Encounter
Called patient to discuss prior MRI.  Based on report, there is no localized pathology of the lumbar spine to explain patient's ongoing BLE weakness.  Given her hx of MG, with recent exacerbation, it is more likely due to MG than nerve impingement given the report states no evidence of stenosis.  Recommend to continue PT and follow-up with Neurology as planned.    FINDINGS:  Segmentation: Normal segmentation. Lowest well-formed disc labeled  the L5-S1 level.    Alignment: Trace anterolisthesis of L5 on S1, stable. Alignment  otherwise normal with preservation of the normal lumbar lordosis.    Vertebrae: Vertebral body heights maintained without evidence for  acute or chronic fracture. Bone marrow signal intensity diffusely  heterogeneous without discrete or worrisome osseous lesions. No  abnormal marrow edema.    Conus medullaris and cauda equina: Conus extends to the L1 level.  Conus and cauda equina appear normal.    Paraspinal and other soft tissues: Paraspinous soft tissues  demonstrate no acute finding. Few scattered T2 hyperintense cyst  noted within the kidneys bilaterally. Visualized visceral structures  otherwise unremarkable.    Disc levels:    L1-2: Negative interspace. Mild bilateral facet hypertrophy. No  canal or foraminal stenosis. Appearance is stable.    L2-3: Mild circumferential disc bulge with disc desiccation and  intervertebral disc space narrowing, mildly progressed from  previous. Moderate facet and ligament flavum hypertrophy. No  significant canal or lateral recess narrowing. Mild left greater  than right L2 foraminal stenosis. Changes have mildly progressed  from previous.    L3-4: Chronic intervertebral disc space narrowing with diffuse disc  bulge and disc desiccation. Moderate facet and ligament flavum  hypertrophy. No significant spinal stenosis. Mild left with mild to  moderate right L3 foraminal stenosis, mildly progressed from  previous. L4-5: Diffuse circumferential disc bulge with disc desiccation and  intervertebral disc space narrowing. Shallow right central disc  protrusion indents the right ventral thecal sac. Superimposed  moderate facet and ligament flavum hypertrophy. Resultant mild canal  with right greater than left lateral recess narrowing, mildly  progressed from previous. Mild left with mild to moderate right L4  foraminal narrowing, also minimally progressed.    L5-S1: Trace anterolisthesis. Advanced bilateral facet arthrosis. No  canal or lateral recess stenosis. Foramina remain patent.    IMPRESSION:  Mild for age multilevel disc degeneration with moderate multilevel  facet arthrosis without frank neural impingement. Overall,  degenerative changes have mildly progressed relative to most recent  MRI from 2017.

## 2019-03-26 NOTE — Telephone Encounter
PDL Call to Practice    Reason for Call: Pt is returning a call to schedule  MD: Jefm Bryant    Appointment Related?  [x]  Yes  []  No     If yes;  Date:TBD   Time:    Call warm transferred to PDL: [x]  Yes  []  No    Call Received by Practice Representative:Lillian

## 2019-03-27 NOTE — Telephone Encounter
Attempted to call [facility] to speak with pharmacy in regards to patients denial for Gamunex, no answer from nurse when transferred, left a detailed VM to call back. Ph# [phone]

## 2019-03-27 NOTE — Telephone Encounter
Laurel from [facility] is returning call. Advised that coordinator was at lunch and will call back upon return    May you please assist at your earliest convenience?  CB: [phone]

## 2019-03-31 DIAGNOSIS — Z452 Encounter for adjustment and management of vascular access device: Secondary | ICD-10-CM | POA: Diagnosis not present

## 2019-03-31 DIAGNOSIS — G7000 Myasthenia gravis without (acute) exacerbation: Secondary | ICD-10-CM | POA: Diagnosis not present

## 2019-03-31 DIAGNOSIS — J449 Chronic obstructive pulmonary disease, unspecified: Secondary | ICD-10-CM | POA: Diagnosis not present

## 2019-03-31 DIAGNOSIS — M797 Fibromyalgia: Secondary | ICD-10-CM | POA: Diagnosis not present

## 2019-03-31 DIAGNOSIS — J45909 Unspecified asthma, uncomplicated: Secondary | ICD-10-CM | POA: Diagnosis not present

## 2019-04-01 DIAGNOSIS — Z23 Encounter for immunization: Secondary | ICD-10-CM | POA: Diagnosis not present

## 2019-04-04 DIAGNOSIS — G7000 Myasthenia gravis without (acute) exacerbation: Secondary | ICD-10-CM | POA: Diagnosis not present

## 2019-04-04 DIAGNOSIS — M797 Fibromyalgia: Secondary | ICD-10-CM | POA: Diagnosis not present

## 2019-04-04 DIAGNOSIS — Z452 Encounter for adjustment and management of vascular access device: Secondary | ICD-10-CM | POA: Diagnosis not present

## 2019-04-04 DIAGNOSIS — J45909 Unspecified asthma, uncomplicated: Secondary | ICD-10-CM | POA: Diagnosis not present

## 2019-04-04 DIAGNOSIS — J449 Chronic obstructive pulmonary disease, unspecified: Secondary | ICD-10-CM | POA: Diagnosis not present

## 2019-04-08 ENCOUNTER — Telehealth: Payer: MEDICARE | Attending: Geriatric Medicine

## 2019-04-08 DIAGNOSIS — H547 Unspecified visual loss: Secondary | ICD-10-CM

## 2019-04-08 DIAGNOSIS — R102 Pelvic and perineal pain: Secondary | ICD-10-CM

## 2019-04-08 DIAGNOSIS — H9192 Unspecified hearing loss, left ear: Secondary | ICD-10-CM

## 2019-04-08 NOTE — Progress Notes
OUTPATIENT GERIATRICS CLINIC VIDEO VISIT    Patient Consent to Telehealth Questionnaire   [facility] TELEHEALTH PRECHECKIN QUESTIONS 04/08/2019   By clicking ''I Agree'', I consent to the below:  I Agree     - I agree  to be treated via a video visit and acknowledge that I may be liable for any relevant copays or coinsurance depending on my insurance plan.  - I understand that this video visit is offered for my convenience and I am able to cancel and reschedule for an in-person appointment if I desire.  - I also acknowledge that sensitive medical information may be discussed during this video visit appointment and that it is my responsibility to locate myself in a location that ensures privacy to my own level of comfort.  - I also acknowledge that I should not be participating in a video visit in a way that could cause danger to myself or to those around me (such as driving or walking).  If my provider is concerned about my safety, I understand that they have the right to terminate the visit.       Patient: [name]  MRN: [id number]  DOB: [birth year]  PCP: York Ram., MD  DATE of SERVICE: 04/08/2019    Video visit is being conducted today instead of an in-person visit due to COVID-19.   []  Visit conducted via [facility] ZOOM for the following reason(s):  []  patient/surrogate unable to access myChart video platform  []  accommodate family members that are unable to be present with the patient     Patient is accompanied by her daughter.     Patient is a(n) []  reliable []  unreliable historian and additional collateral information was obtained during the visit today from:       Chief complaint: No chief complaint on file.          HPI:           ALLERGY:     Allergies   Allergen Reactions   ? Beta Adrenergic Blockers Other (See Comments)     Avoid d/t Myasthenia Gravis  Avoid d/t Myasthenia Gravis     ? Macrolides And Ketolides Other (See Comments)     Avoid d/t Myasthenia Gravis  Use with caution d/t Myasthenia Gravis Use with caution d/t Myasthenia Gravis     ? Sulfa Antibiotics Other (See Comments)     May possibly have caused deafness in one ear  May possibly have caused deafness in one ear  other     ? Botulinum Toxins Other (See Comments)     Muscle weakness r/t Myasthenia Gravis  Muscle weakness r/t Myasthenia Gravis     ? Statins Other (See Comments)     Muscle aches  Muscle aches  Muscle aches     ? Plastic Tape Itching and Other (See Comments)     Turns the skin red where applied         MEDICATIONS:     Current Outpatient Medications   Medication Sig   ? Albuterol Sulfate AEPB Inhale Takes 1 to 2 puffs every 4 hours as needed for wheezing or SOB .   ? aluminum hydroxide-magnesium trisilicate 80-20 mg chewable tablet Chew by mouth.   ? azaTHIOprine 50 mg tablet Take 1.5 tablets (75 mg total) by mouth two (2) times daily.   ? buPROPion, SR, (WELLBUTRIN SR) 200 mg 12 hr tablet Take 1 tablet (200 mg total) by mouth two (2) times daily.   ? calcium carbonate 500  mg chewable tablet Chew 1 tablet by mouth daily Calcium Carbonate 500 mg is equivalent to 200 mg elemental Calcium .   ? clonazePAM 1 mg disintegrating tablet Take 1 tablet (1 mg total) by mouth at bedtime. Max Daily Amount: 1 mg   ? clonazePAM 1 mg tablet Take 1 tablet (1 mg total) by mouth three (3) times daily. Max Daily Amount: 3 mg   ? dexlansoprazole 60 mg DR capsule Take 1 capsule (60 mg total) by mouth daily.   ? fosfomycin 3 g powder packet Take 1 packet by mouth once (Dissolve packet in 90 mL of water. For partial package doses, give appropriate portion.) .   ? meclizine 12.5 mg tablet Take 1 tablet (12.5 mg total) by mouth three (3) times daily as needed.   ? Methylcellulose POWD 1 packet by Does not apply route as needed for.   ? metroNIDAZOLE 0.75% gel metronidazole 0.75 % topical gel   ? montelukast 10 mg tablet Take 10 mg by mouth as needed for.   ? nitrofurantoin 100 mg capsule nitrofurantoin monohydrate/macrocrystals 100 mg capsule ? phenazopyridine 200 mg tablet Take 1 tablet (200 mg total) by mouth three (3) times daily with meals.   ? polyethylene glycol powder packet Take 17 g by mouth.   ? pyridostigmine 60 mg tablet Take 1 tablet (60 mg total) by mouth three (3) times daily.   ? triamcinolone 0.1% cream Apply topically two (2) times daily.   ? trimethoprim 100 mg tablet Take 1 tablet (100 mg total) by mouth daily.     No current facility-administered medications for this visit.          SOCIAL HISTORY:     Social History     Social History Narrative   ? Not on file         PHYSICAL EXAM:     There were no vitals taken for this visit.    Gen []  NAD     Eyes []  Conj/Lids wnl  []   Sclerae non-icteric   []  EOMI     ENT []  Oropharynx clear  []  Hearing appears adequate  []  Hearing appears impaired     Neck []  Inspect     Resp []  Unlabored []  No accessory muscle use       CV []  Edema     Lymph      GI []  No obvious tenderness when palpated by self/caregiver  []  Appears distended     Neuro []  No facial droop   []   No pronator drift   []  Finger to nose intact     MS []  Gait  []  ROM  []  Balance []  No tremors     Skin []  Inspection     Psych []  Insight/judgment  []  Affect  []  Cognition             LABS and STUDIES:     I have:   []  Reviewed []  1 []  2 []  ? 3 unique laboratory, radiology, and/or diagnostic tests noted below    Test/Study: ** * on date  ** *.    []  Reviewed []  1 []  2 []  ? 3 prior external notes and incorporated into patient assessment    I reviewed Dr. Marland Kitchen *'s note in specialty ** * from date  * **.    []  Discussed management or test interpretation with external provider(s) as noted      []  Ordered []  1 []  2 []  ? 3 unique laboratory, radiology,  and/or diagnostic tests noted below in A&P      Labs:  Lab Results   Component Value Date    WBC 8.75 12/17/2018    HGB 12.2 12/17/2018    MCV 102.2 (H) 12/17/2018    PLT 420 (H) 12/17/2018     Lab Results   Component Value Date    NA 141 12/17/2018    K 4.1 12/17/2018    CREAT 1.13 12/17/2018 GFRESTNOAA 46 12/17/2018    CALCIUM 9.8 12/17/2018     Lab Results   Component Value Date    ALT 11 12/17/2018    AST 20 12/17/2018    ALKPHOS 61 12/17/2018    BILITOT 0.2 12/17/2018    ALBUMIN 4.3 12/17/2018     Lab Results   Component Value Date    TSH 1.00 12/17/2018      No results found for: HGBA1C  No results found for: CHOL, CHOLDLCAL  No results found for: CHOLDLQ  No results found for: VITD25OH  No results found for: BNP      Studies:      ASSESSMENT and PLAN:     ?  #Macrocytosis--check B12, folic acid at some point  #Deconditioning--initiate home PT  #GERD--s/p Nisan fundoplication  #COPD--mixed--per PFT's from [name].--consider reinitiation of inhalers.  #s/p rectocele repair  #s/p cholecystectomy  #Nocturnal hypoxemia--presumably related to MG crisis but has history of COPD. ?Will order nocturnal home O2 study and consider PFT's/Pulmonary evaluation once settled down. ?May need to restart BREO inhaler.  #Myasthenia Gravis--s/p plasmapheresis x5 October 2020, no Thymoma, on Azathioprine 150 daily and mestinon 60 tid. ?Saw [facility]. Enedina Finner at [facility]. ?Was considering Soliris.  Ordering IVIG and MRI  #IBS--titrate miralax to comfort. ?Consider duloxetine substitute for buproprion.  #Interstitial cystitis--on daily Trimethoprim 100 for UTI prophylaxis. ?Using bladder instillations prn. OFF topical estrogen due to advice from neurologist re: MG.  #Major Depression--active. ?Likely to add or substitute duloxetine  #Allergic rhinitis--previously on nasal steroids.  #Breast CA--DCIS?by report--obtain mammo. ?Patient declined surgery in 2019 and experienced dry mouth from Tamoxifen. ?Was considering Anastrozole. Await records from prior mammogram and treatment.  Per records from [facility]: ''07/20/2017 Screening detected right breast calcifications and distortion. The distortion was in the upper outer quadrant: Biopsy DCIS intermediate grade with CSL; calcifications UIQ: 2.8 cm: Biopsy DCIS+ ALH +CSL; 4 cm apart, axilla negative, Tis NX stage 0''  #DM with CKD--stabe 3b--based on labs from [name]. ?FOllow here. ?If BP permits, may consider ARB BUT patient with h/o angioedema so will ONLY initiate this agent with great caution.  #OA knees--previously getting hyaluronic acid injections.  #Anxiety  #Benzodiazepine dependence--will work to wean over time.  #Fibromyalgia--duloxetine as above. ?Trial PT  #deafness in one ear--will schedule Audiology once COVID concerns less.  #Polypharmacy  #Statin intolerance    # advice given about precautions for coronavirus    # advanced care planning/goals of care discussion.         INTERACTION COMPLEXITY and SOCIAL DETERMINANTS of HEALTH     []  New problem with uncertain diagnosis  []  New problem that poses threat to life or bodily function      INTERACTION COMPLEXITY  []  Discussion with alternate (proxy) if patient with impaired communication / comprehension ability (e.g., dementia, aphasia, severe hearing loss).    []  Repeated questions (or disagreement) between patient and/among caregivers/family during the visit.    []  Caregiver/patient emotions/behavior/beliefs interfering with implementation of treatment plan.      SDoH  The diagnosis  or treatment of said conditions is significantly limited by the following social determinants of health:  []  Z59.0 Homelessness  []  Z59.1 Inadequate housing  []  Z59.2 Discord with neighbors, lodgers and landlord  []  Z60.2 Problems related to living alone  []  Z59.8 Other problems related to housing and economic circumstances  []  Z59.4 Lack of adequate food and safe drinking water  []  Z59.6 Low income  []  Z59.7 Insufficient social insurance and welfare support []  Z59.9 Problems related to housing and economic circumstances, unspecified  []  Z75.3 Unavailability and inaccessibility of health care facilities  []  Z75.4 Unavailability and inaccessibility of other helping agencies  []  Z72.4 Inappropriate diet and eating habits  []  Z62.820 Parent-biological child conflict  []  Z63.8 Other specified problems related to primary support group  []  Z55.0 Illiteracy and low level literacy   []  Z56.9 Unspecified problems related to employment        FOLLOW-UP     RTC     Future Appointments   Date Time Provider Department Center   04/14/2019  9:00 AM [facility] NEUMUSC B200 NEUROLOGY   04/16/2019  1:00 PM Trenda Moots., MD ESTWS [id number] MEDICINE   06/09/2019 12:40 PM MP2 MAM 02-RADIOLOGY MP2[facility]         The above plan of care, diagnosis, orders, and follow-up were discussed with the patient and/or surrogate. Questions related to this recommended plan of care were answered.      If Billing Based on Time:     I performed the following items on the day of service:    []  Preparing to see the patient (e.g., review of tests)  []  Obtaining and/or reviewing separately obtained history   []  Performing a medically appropriate examination and/or evaluation   []  Counseling and educating the patient/family/caregiver   []  Ordering medications, tests, or procedures  []  Referring and communicating with other healthcare professionals (when not separately reported)  []  Documenting clinical information in the EHR  []  Independently interpreting results and communicating results to patient/family/caregiver    I spent the following total amount of time on these tasks on the day of service:  New Patient     Established Patient  []  15-29 minutes ? 95284    []  up to 9 minutes ? 99211  []  30-44 minutes ? 13244     []  10-19 minutes ? 99212   []  45-59 minutes ? 01027      []  20-29 minutes ? 99213   []  60-74 minutes ? 25366   []  30-39 minutes ? 44034         []  40-55 minutes ? 74259 []  I spent an additional * ** 15-minute-increment(s) for a total of * ** minutes on these tasks on the day of service. [phone] for each additional 15 minutes.)      Clinical Frailty Scale:    Patient's score:            [None of the following criteria are required at this time due to CoVID-19.]  []   This patient is an established patient in my practice.   []   This patient lives in [place].  []   I am a licensed physician in the state of [place].       Signed: York Ram, MD 04/08/2019

## 2019-04-09 ENCOUNTER — Ambulatory Visit: Payer: Medicare Other | Admitting: Adult Health

## 2019-04-09 ENCOUNTER — Ambulatory Visit: Payer: Medicare Other | Admitting: Internal Medicine

## 2019-04-13 ENCOUNTER — Telehealth: Payer: MEDICARE

## 2019-04-14 ENCOUNTER — Telehealth: Payer: BLUE CROSS/BLUE SHIELD

## 2019-04-14 ENCOUNTER — Ambulatory Visit: Payer: BLUE CROSS/BLUE SHIELD | Attending: Neurology

## 2019-04-14 DIAGNOSIS — R1310 Dysphagia, unspecified: Secondary | ICD-10-CM

## 2019-04-14 DIAGNOSIS — G7001 Myasthenia gravis with (acute) exacerbation: Secondary | ICD-10-CM | POA: Diagnosis not present

## 2019-04-14 MED ORDER — AZATHIOPRINE 50 MG PO TABS
75 mg | ORAL_TABLET | Freq: Two times a day (BID) | ORAL | 3 refills | Status: AC
Start: 2019-04-14 — End: ?

## 2019-04-14 NOTE — Telephone Encounter
I called patient and left her a voicemail message, with her [employer].

## 2019-04-14 NOTE — Progress Notes
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     Interval Events:    Last visit 02/03/2019  Since last visit, patient's symptoms are mostly stable. She is due for next session of IVIg now (last received 3 weeks ago.) She states prior to each session, her speech is more slurred, more fatigue, and more blurry vision with reading/watching TV. She also has leg spasms. She also has intermittent dysphagia but this has been ongoing for decades and she thinks this may be related to GERD. This all improves after getting IVIg. She does not believe mestinon is effective and she remains compliant with Immuran 75 mg BID.     She saw Dr. Threasa Beards (PMR) who does not believe spinal stenosis is contributing to her weakness and recommends PT. Patient is awaiting for covid vaccine to become effective (received 2nd pfizer shot on Feb 3rd) before doing PT and acupuncture.       Current Regimen:  Imuran 75 mg BID  Mestinon 60 mg TID  IVIG 60 g Q 3w     Previously Failed  Prednisone - oral ulcers    Past Medical History:  Fibromyalgia    Breast cancer (2017) - contained, no tx   Interstitial cystitis x 30 years   Fall with brief LOC (09/2016)  Fall with knee infection/contusion (10/2018)  Depression and anxiety   Diverticulitis   COPD  Asthma     Past Surgical History:  Meniscus tear repair in left knee    Family History:   Adopted at birth. Birth mother may have had breast cancer.   Brother(58) and sister (80) were both adopted from different families    Social History: She moved from her independent living place in [place], [place] to [place] with her daugther.   Pt  reports that she quit smoking about 36 years ago. She has quit using smokeless tobacco. Smoked 0.5 PPD from 1960 -1985. Denies EtOH or recreational drug use. Retired, college-educated, widowed.     Allergies:  Allergies   Allergen Reactions   ? Beta Adrenergic Blockers Other (See Comments)     Avoid d/t Myasthenia Gravis  Avoid d/t Myasthenia Gravis     ? Macrolides And Ketolides Other (See Comments)     Avoid d/t Myasthenia Gravis  Use with caution d/t Myasthenia Gravis  Use with caution d/t Myasthenia Gravis     ? Sulfa Antibiotics Other (See Comments)     May possibly have caused deafness in one ear  May possibly have caused deafness in one ear  other     ? Botulinum Toxins Other (See Comments)     Muscle weakness r/t Myasthenia Gravis  Muscle weakness r/t Myasthenia Gravis     ? Statins Other (See Comments)     Muscle aches  Muscle aches  Muscle aches     ? Plastic Tape Itching and Other (See Comments)     Turns the skin red where applied        Medications:     Outpatient Medications Prior to Visit   Medication Sig   ? Albuterol Sulfate AEPB  Inhale Takes 1 to 2 puffs every 4 hours as needed for wheezing or SOB .   ? aluminum hydroxide-magnesium trisilicate 80-20 mg chewable tablet Chew by mouth.   ? buPROPion, SR, (WELLBUTRIN SR) 200 mg 12 hr tablet Take 1 tablet (200 mg total) by mouth two (2) times daily.   ? calcium carbonate 500 mg chewable tablet Chew 1 tablet by mouth daily Calcium Carbonate 500 mg is equivalent to 200 mg elemental Calcium .   ? clonazePAM 1 mg disintegrating tablet Take 1 tablet (1 mg total) by mouth at bedtime. Max Daily Amount: 1 mg   ? clonazePAM 1 mg tablet Take 1 tablet (1 mg total) by mouth three (3) times daily. Max Daily Amount: 3 mg   ? dexlansoprazole 60 mg DR capsule Take 1 capsule (60 mg total) by mouth daily. ? fosfomycin 3 g powder packet Take 1 packet by mouth once (Dissolve packet in 90 mL of water. For partial package doses, give appropriate portion.) .   ? meclizine 12.5 mg tablet Take 1 tablet (12.5 mg total) by mouth three (3) times daily as needed.   ? metroNIDAZOLE 0.75% gel metronidazole 0.75 % topical gel   ? montelukast 10 mg tablet Take 10 mg by mouth as needed for.   ? nitrofurantoin 100 mg capsule nitrofurantoin monohydrate/macrocrystals 100 mg capsule   ? phenazopyridine 200 mg tablet Take 1 tablet (200 mg total) by mouth three (3) times daily with meals.   ? pyridostigmine 60 mg tablet Take 1 tablet (60 mg total) by mouth three (3) times daily.   ? triamcinolone 0.1% cream Apply topically two (2) times daily.   ? trimethoprim 100 mg tablet Take 1 tablet (100 mg total) by mouth daily.   ? azaTHIOprine 50 mg tablet Take 1.5 tablets (75 mg total) by mouth two (2) times daily.     No facility-administered medications prior to visit.          Physical Exam  24 hour vitals:    BP 161/67  ~ Pulse 92  ~ Temp 36.2 ?C (97.1 ?F)  ~ Ht 5' 4'' (1.626 m)  ~ Wt 144 lb (65.3 kg)  ~ BMI 24.72 kg/m?        Gen: NAD, well-appearing, cooperative, appears younger than stated age.  Resp:  Able to count to 22 on a single breath.     Neurological Exam  Mental Status  Awake, alert and oriented to person, place and time. Recent and remote memory are intact. Speech is normal. Language is fluent with no aphasia. Attention and concentration are normal. Fund of knowledge is appropriate for level of education.    Cranial Nerves  CN II: Visual fields full to confrontation. Blurry vision but no double vision.  CN III, IV, VI: Extraocular movements intact bilaterally.  CN V: Facial sensation is normal.  CN VII: Full and symmetric facial movement.  CN VIII: Hearing is normal.  CN IX, X: Palate elevates symmetrically. Normal gag reflex.  CN XI: Shoulder shrug strength is normal.  CN XII: Tongue midline without atrophy or fasciculations. No left ptosis .    Motor  Normal muscle bulk throughout. No fasciculations present. Normal muscle tone.                                             Right  Left  Neck flexion                           4+                          4+  Neck extension                      4+                          4+   Shoulder abduction               4                          4  Elbow flexion                         4+                          4+  Elbow extension                    4+                          4+  Wrist flexion                           4+                          4+  Wrist extension                      4+                          4+  Hip flexion                              4                          4  Knee flexion                           5                          3  Knee extension                      5                          5  Plantarflexion                         5                          5  Dorsiflexion  5                          5    Sensory  Light touch is normal in upper and lower extremities.     Reflexes                                           Right                      Left  Brachioradialis                    2+                         2+  Biceps                                 2+                         2+  Triceps                                3+                         3+  Patellar                                3+                         3+  Achilles                                2+                         2+  Plantar                           Downgoing                Downgoing    Right pathological reflexes: Hoffmann's absent. Crossed adductor present. Ankle clonus absent.  Left pathological reflexes: Hoffmann's absent. Crossed adductor present. Ankle clonus absent.    Coordination  Mild bilateral intention tremors..    Gait  Casual gait: Unable to rise from chair without using arms.  Walks slowly with stooped posture and spatic left leg. . Labs   General Labs: no new labs  ?  Labs 08/26/2015: AChR binding (0.63) blocking (28) antibody, ESR 3, CRP 4.9    Imaging and Diagnostics     Repetitive Nerve stimulation in office 12/23/2018: No decrement after stimulation of CN XI and left ulnar nerve     (from neurologist Dr. Eliane Decree note)    MRI/MRA head 09/09/2015:  This MR angiogram of the intracerebral arteries shows the following:  1. ??Very minimal atherosclerotic change within the left posterior cerebral artery that is unlikely to be clinically significant.  2. ??There is  a normal variant with the left posterior cerebral artery obtaining its flow from the anterior circulation.  3. ???No aneurysms are seen.  4. ???No new findings compared to the 11/25/2003 MRA. ??  ?  MRI orbit wwo contrast 09/09/2015: This is is a normal MRI of the orbits with and without contrast.    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 Uncovertebral joint osteophytes cause severe right C3-C4,   severe right and moderate left C5-C6 and severe left and mild right C6-C7 neural foraminal narrowing. Facet joint degeneration is severe on the left at C2-C3, the right at C3-C4 and C4-C5, and is moderate bilateral and C7-T1.     ASSESSMENT & PLAN:  Mrs. Spruiell is a 84 years old female with a pmhx of seropositive initially ocular (2017) now generalized myasthenia gravis (AchR binding and blocking +), breast cancer, interstitial cystitis, fibromyalgia, COPD, asthma, 11/2018 with MG exacerbation s/p PLEX and found to have UTI, who is following up for management of MG. She has been stable on Mestinon 60mg  TID, Azathioprine 75mg  BID and IVIG 1g/kg q3w for the past 3 years. She continues to endorse dysarthria/dysphagia, blurry vision, gen weakness that has been stable with worsening of these symptoms prior to her next infusion. Suspect that her weakness and gait difficulty is in part multifactorial secondary to cervical myelopathy (hyper-reflexia, spasticity, and radicular pain on exam) and dyspnea related COPD. Will continue with current regimen and PT and defer more aggressive regimen with solaris given her response to IVIg and co morbidities contributing to her complaints.      Plan   - continue Azathioprine 75 mg BID, d/c mestinon  - check CBC, CMP Q 6 months (due March 2021)  - continue IVIg 60g (1g/kg) Q 3 weeks ([facility]) and monitor response  - obtain video swallow study and consider GI referral therafter  - PT when covid safe    RTC in 3 months. Patient discussed with attending, Dr. Johnanna Schneiders, MD  Neuromuscular Medicine Fellow

## 2019-04-14 NOTE — Telephone Encounter
Noted, I will submit PA

## 2019-04-14 NOTE — Telephone Encounter
Hi Alissa,  Can you please begin a PA for her Wellbutrin SR 200 mg BID?  She has severe medication allergies, severe depression/anxiety, and worrisome myesthenia gravis so a medication change at this point would be fraught with disaster!!

## 2019-04-15 ENCOUNTER — Ambulatory Visit: Payer: BLUE CROSS/BLUE SHIELD

## 2019-04-16 ENCOUNTER — Ambulatory Visit: Payer: BLUE CROSS/BLUE SHIELD

## 2019-04-16 DIAGNOSIS — M47812 Spondylosis without myelopathy or radiculopathy, cervical region: Secondary | ICD-10-CM

## 2019-04-16 DIAGNOSIS — F419 Anxiety disorder, unspecified: Secondary | ICD-10-CM

## 2019-04-16 DIAGNOSIS — M47816 Spondylosis without myelopathy or radiculopathy, lumbar region: Secondary | ICD-10-CM

## 2019-04-16 DIAGNOSIS — G7000 Myasthenia gravis without (acute) exacerbation: Secondary | ICD-10-CM

## 2019-04-16 DIAGNOSIS — M797 Fibromyalgia: Secondary | ICD-10-CM

## 2019-04-16 DIAGNOSIS — G8929 Other chronic pain: Secondary | ICD-10-CM

## 2019-04-16 DIAGNOSIS — M7918 Myalgia, other site: Secondary | ICD-10-CM

## 2019-04-16 DIAGNOSIS — S161XXA Strain of muscle, fascia and tendon at neck level, initial encounter: Secondary | ICD-10-CM

## 2019-04-16 DIAGNOSIS — M542 Cervicalgia: Secondary | ICD-10-CM

## 2019-04-16 NOTE — Patient Instructions
Apply a TENS unit to Stomach-36 point.          Shiatsu Neck & Back Massager with Heat  [employer]    [email or web address]              TENS (transcutaneous electrical nerve stimulation) unit  Portable device that uses electrical stimulation to reduce pain.    Instructions for use:  Place electrical pads on or around painful area. Turn up unit amplitude/intensity dial until sensation is present, but not to point of pain or discomfort. Reduce amplitude/intensity if pain is present. Start with 5 minutes use 2 times a day and increase duration and times per day as needed or tolerated. Goal is a total of 30 minutes or more each day. There is no evidence of overuse. Remember to turn off unit before removing pads. Consult user manual for more details.    Caution if have pacemaker or have a history of seizures or epilepsy. Avoid use over eyes, head, front of neck, broken skin or open wounds, or near tumors.    Sample units:  [employer] PM-720 TENS     $45.95 through [email or web address]    [employer] TENS Electronic Pulse Massager    $29.99 through [email or web address]    [employer] RENS    $27.99 through [email or web address]

## 2019-04-16 NOTE — Consults
[facility] - Consultation     Patient: [name] [id number]    DOB: 10-15-1935  Visit Date: 04/16/2019  PCP: York Ram., MD  Referring MD: Valeria Batman., MD (ortho physical med, for acupuncture)    Chief Complaint   Patient presents with   ? Neck Pain   ? Shoulder Pain   ? Leg Pain       History of Present Illness:  Niyati Heinke is a a 84 y.o. female with PMH of but not limited to  has no past medical history on file. who presents to [facility] for cervical and lumbar spondylosis.     # Chronic neck and shoulder pain  - present for many years, gradual onset, no inciting event or trauma. Underlying myasthenia gravis.   - Prior work-up: MR cervical spine with multilevel degenerative disc disease in setting of C4-C7 congenital cervical spinal canal stenosis, neural foraminal narrowing, facet joint degeneration   - Interventions: no medications for pain; was going to start PT but didn't go due to pandemic  - Self-care: cervical collar sometimes if needed to bend down  - Timing/Duration: constant, associated with activities   - Location/Radiation: bilateral neck, no radiation  - Quality: aching, sharp at times  - Severity: varies between 5 to 10 out of 10  - Modifiers: worse with activity. better with rest.   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 Husband died 2 years ago of CHF.   BM: intermittent constipation, diarrhea   Temperature: tends to run cold  Supplements: none    Review of Systems:  A 14 point review of systems was completed on intake and fully reviewed with patient.  Pertinent areas discussed and documented in HPI or noted below.    Social History:  Social History     Tobacco Use   ? Smoking status: Former Smoker     Quit date: 1985     Years since quitting: 36.1   ? Smokeless tobacco: Former Estate agent Use Topics   ? Alcohol use: Not on file   ? Drug use: Not on file     Social History     Social History Narrative   ? Not on file       Home Medications:  Medications that the patient states to be currently taking   Medication Sig   ? Albuterol Sulfate AEPB Inhale Takes 1 to 2 puffs every 4 hours as needed for wheezing or SOB .   ? aluminum hydroxide-magnesium trisilicate 80-20 mg chewable tablet Chew by mouth.   ? azaTHIOprine 50 mg tablet Take 1.5 tablets (75 mg total) by mouth two (2) times daily.   ? buPROPion, SR, (WELLBUTRIN SR) 200 mg 12 hr tablet Take 1 tablet (200 mg total) by mouth two (2) times daily.   ? calcium carbonate 500  mg chewable tablet Chew 1 tablet by mouth daily Calcium Carbonate 500 mg is equivalent to 200 mg elemental Calcium .   ? clonazePAM 1 mg disintegrating tablet Take 1 tablet (1 mg total) by mouth at bedtime. Max Daily Amount: 1 mg   ? clonazePAM 1 mg tablet Take 1 tablet (1 mg total) by mouth three (3) times daily. Max Daily Amount: 3 mg   ? dexlansoprazole 60 mg DR capsule Take 1 capsule (60 mg total) by mouth daily.   ? GAMMAGARD 30 GM/300ML SOLN    ? meclizine 12.5 mg tablet Take 1 tablet (12.5 mg total) by mouth three (3) times daily as needed.   ? montelukast 10 mg tablet Take 10 mg by mouth as needed for. ? phenazopyridine 200 mg tablet Take 1 tablet (200 mg total) by mouth three (3) times daily with meals.   ? pyridostigmine 60 mg tablet Take 1 tablet (60 mg total) by mouth three (3) times daily.   ? trimethoprim 100 mg tablet Take 1 tablet (100 mg total) by mouth daily.     Past Medical and Surgical History:  No past medical history on file.   Patient Active Problem List    Diagnosis Date Noted   ? Benzodiazepine dependence (HCC/RAF) 12/20/2018   ? Breast cancer (HCC/RAF) 12/20/2018   ? Spondylosis without myelopathy or radiculopathy, lumbar region 11/28/2018   ? Anxiety 11/27/2018   ? CKD (chronic kidney disease), stage III 11/27/2018   ? Nocturnal hypoxemia 04/07/2018     Last Assessment & Plan:   We discussed potential impact of hypoxemia from her COPD on other body function  Plan-overnight oximetry     ? Major depression, recurrent, chronic (HCC/RAF) 12/16/2017   ? Diabetes mellitus (HCC/RAF) 12/16/2017   ? Carcinoma in situ of breast 07/24/2017     Last Assessment & Plan:   07/19/2017?Screening detected right breast calcifications and distortion. ?The distortion was in the upper outer quadrant: Biopsy DCIS intermediate grade with CSL; calcifications UIQ: 2.8 cm: Biopsy DCIS+ ALH +CSL; 4 cm apart, axilla negative, Tis NX stage 0  ?  Significant delay in decision making because of psychosocial issues and myasthenia gravis.  Patient refused surgery and went on Tamoxifen 10 mg daily for 1 month and discontinued it.  Started anastrozole 01/30/1999 19/2 a tablet daily    Anastrozole toxicities:    Breast cancer surveillance: Mammogram 08/15/2018: Biopsy-proven sites of DCIS and ALH are stable, breast density category C    Continue every 20-month mammograms and follow-up.     ? Osteoarthritis of knee 05/17/2017   ? H/O arthroscopy 03/06/2017   ? Myasthenia gravis with exacerbation (HCC/RAF) 12/18/2016   ? Vaginal atrophy 08/16/2016   ? Acute medial meniscal tear, left, subsequent encounter 07/18/2016 ? Renal cyst 03/12/2016   ? Asthmatic bronchitis, mild persistent, with acute exacerbation 12/29/2015     Last Assessment & Plan:   Continue rescue inhaler as needed. Try adding sample Bevespi maintenance inhaler to see if this helps.  She is currently on prednisone 20 mg daily maintenance and I don't think adding a steroid inhaler component will make any difference.     ? Urethral caruncle 07/26/2015   ? Chronic cholecystitis 01/14/2015   ? Angioedema 05/23/2014     Attributed to injected methylprednisolone    Last Assessment & Plan:   We discussed limited ability to test for possible triggers. We are asking our labs sources about ability to test against methylprednisolone for injection.  A different brand  might be worth trying. She can try pre-medicating with an antihistamine.     ? Chronic interstitial cystitis 10/01/2011   ? Insomnia 05/31/2007     Qualifier: Diagnosis of   By: Maple Hudson MD, Clinton D    Last Assessment & Plan:   I'm concerned that some of her sleep problems may reflect obstructive sleep apnea based on her palate and which she can tell me about her sleep patterns without a witness. We also want to know about oxygenation during sleep.  Plan-schedule sleep study  Qualifier: Diagnosis of   By: Maple Hudson MD, Clinton D      Last Assessment & Plan:   I'm concerned that some of her sleep problems may reflect obstructive sleep apnea based on her palate and which she can tell me about her sleep patterns without a witness. We also want to know about oxygenation during sleep.  Plan-schedule sleep study     ? COPD mixed type (HCC/RAF) 04/30/2007     Office Spirometry 03/16/2016-severe obstructive airways disease FVC 1.59/59%, FEV1 0.98/49%, ratio 0.62, FEF 25?75% 0.42/28%  PFT 04/16/16-severe obstructive airways disease with slight response to bronchodilator, severe diffusion defect. FEV1/FVC 0.64, DLCO 45%    Last Assessment & Plan:   Severe obstructive airways disease. She can't hear herself wheeze. Plan-try samples of Trelegy instead of Anoro for comparison    Overview:   Office Spirometry 03/16/2016-severe obstructive airways disease FVC 1.59/59%, FEV1 0.98/49%, ratio 0.62, FEF 25?75% 0.42/28%  PFT 04/16/16-severe obstructive airways disease with slight response to bronchodilator, severe diffusion defect. FEV1/FVC 0.64, DLCO 45%    Last Assessment & Plan:   Severe obstructive airways disease. She can't hear herself wheeze.  Plan-try samples of Trelegy instead of Anoro for comparison  Office Spirometry 03/16/2016-severe obstructive airways disease FVC 1.59/59%, FEV1 0.98/49%, ratio 0.62, FEF 25?75% 0.42/28%  PFT 04/16/16-severe obstructive airways disease with slight response to bronchodilator, severe diffusion defect. FEV1/FVC 0.64, DLCO 45%    Last Assessment & Plan:   Severe COPD but mainly emphysema with limited bronchospasm component that could be addressed with bronchodilators.  Plan-try sample Bevespi as a maintenance controller, refill pro-air     ? Esophageal reflux 04/30/2007     History of fundoplication     Last Assessment & Plan:   Emphasized continued attention to antireflux measures. Reminded that reflux can be a significant trigger for irritable airways, cough and wheeze.  History of fundoplication       Last Assessment & Plan:   Emphasized continued attention to antireflux measures. Reminded that reflux can be a significant trigger for irritable airways, cough and wheeze.     ? 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 Management is mostly by her ENT physicians.     ? Fibromyalgia 04/30/2007     Qualifier: Diagnosis of   By: Yetta Barre CNA/MA, Rose Fillers: Diagnosis of   By: Yetta Barre CNA/MA, Shanda Bumps     ? Seasonal and perennial allergic rhinitis 04/30/2007     Qualifier: Diagnosis of   By: Yetta Barre CNA/MA, Shanda Bumps     Last Assessment & Plan: She is now on prednisone 20 mg daily. I suggested she wait until her myasthenia status is stabilized. Then if she needs to she couldn't seek evaluation at one of the allergy practices in town as discussed, since our  vaccine program will be closing.  Qualifier: Diagnosis of   By: Yetta Barre CNA/MA, Jessica       Last Assessment & Plan:   She is now on prednisone 20 mg daily. I suggested she wait until her myasthenia status is stabilized. Then if she needs to she couldn't seek evaluation at one of the allergy practices in town as discussed, since our vaccine program will be closing.       No past surgical history on file.   Family History:  family history is not on file.      Objective   BP 171/79  ~ Pulse 72  ~ Temp 36.5 ?C (97.7 ?F) (Forehead)   Physical Exam:  General: alert, appears stated age and cooperative  Eyes: conjunctiva and lids normal  ENMT: normal external nose and ears.    Lungs: normal respiratory effort.  no cyanosis.   Extremities:extremities normal, atraumatic, no cyanosis or edema  Skin: warm and dry, no rashes.  Neuro: normal sensation, no focal motor deficits.  Psych: alert and oriented to person, place and time  Musculoskeletal: tender points of muscle groups noted below       Trigger/ Tender Points Identification: (see annotated picture)      Tests/Imaging Reviewed:    Patient's Old records and labs reviewed by physician.    Mr Cervical Spine Wo Contrast    Result Date: 01/01/2019  IMPRESSION: Multilevel degenerative disc disease in the setting of C4-7 congenital cervical spinal canal stenosis with effacement of the subarachnoid spaces, but  without active cord compression. Uncovertebral joint osteophytes cause severe right C3-C4, severe right and moderate left C5-C6 and severe left and mild right C6-C7 neural foraminal narrowing. Facet joint degeneration is severe on the left at C2-C3, the right at C3-C4 and C4-C5, and is moderate bilateral and C7-T1. . Signed by: Clarene Essex   01/01/2019 1:44 PM Assessment and Plan   Missey Hasley is a 84 y.o. female presenting with:  1. Chronic neck pain  Acupuncture   2. Cervical myofascial strain, initial encounter  Acupuncture    INJECT TRIGGER POINTS, > 3   3. Myofascial pain  Acupuncture    INJECT TRIGGER POINTS, > 3   4. Myasthenia gravis without (acute) exacerbation (HCC/RAF)       # Chronic neck pain  # Cervical myofascial pain  Patient with chronic neck pain for years, underlying myasthenia gravis, and MRI with multilevel degenerative changes and neural foraminal narrowing. Exam reveals multiple trigger points suggesting significant myofascial component to pain.   - started acupuncture today  - trigger point injections done today   - discussed TENS unit use  - encouraged heating pads  - can try neck massager device  - gentle neck stretching, can reinforce next visit     # Myasthenia gravis   - Qi tonic foods     Treatments Provided Today     Acupuncture point interventions:    LI-4/Lv-3, Sp-6/St-36   Sp-10       GB-34, GB-21   [place]     Acupuncture Treatment Duration: 20 Minutes  Injection of : Trigger Points with 0.71mL 1% lidocaine      Trapezius, Splenius cervicis Rhomboids, Infraspinatus, Levator scapulae                Trigger Point Injection Procedure Note   Procedure: Trigger point injection of Trapezius, Splenius cervicis Rhomboids, Infraspinatus, Levator scapulae              Indication: Myofascial pain;  pain or altered sensation in the expected distribution of referred pain from a trigger point; taut band palpable in an accessible muscle with exquisite tenderness at one point along the length of it; some degree of restricted motion when measurable; reproduction of pain by stimulating trigger point Consent: The risks (infection, bleeding, pain) and benefits of trigger points injections were explained to the patient and alternatives were discussed. All questions were answered to the patient's satisfaction. The patient verbalized understanding and consented to the trigger point injection(s).  Sterilization: The target area of the active trigger point(s) was sterilized using a 70% isopropyl alcohol pad.   Procedure: Manual palpation was used to identify the target area. Using a 1-inch 23 gauge and/or 0.5-inch 25 gauge needle, the following procedure was completed:   Injection of : Trigger Points with 0.16mL 1% lidocaine   into Trapezius, Splenius cervicis Rhomboids, Infraspinatus, Levator scapulae              The needle was infiltrated 1-3 cm in a fanwise method into each trigger point location. Manual pressure was applied after the procedure. There was no significant blood loss. The patient tolerated the procedure well.    Follow-Up      Return in about 3 weeks (around 05/07/2019) for Blake Divine / K Hui.    Future Appointments   Date Time Provider Department Center   04/21/2019 12:30 PM Alvina Chou, AuD MP2 AUD AUDIOLOGY/SP   04/28/2019  3:00 PM Sherilyn Banker, OD DS VIS REHAB OPH   06/03/2019  2:20 PM York Ram., MD GERI IMS 420 MEDICINE   06/09/2019 12:40 PM MP2 MAM 02-RADIOLOGY MP2BR IMG [place] Rad   07/14/2019 10:00 AM NEU [facility] NEUMUSC B200 NEUROLOGY       Seen and discussed with attending, Dr. Karie Mainland.     Author: Will Bonnet, MD, 04/16/2019, 2:15 PM   [facility] Fellow and Clinical Instructor

## 2019-04-17 ENCOUNTER — Telehealth: Payer: MEDICARE

## 2019-04-17 NOTE — Telephone Encounter
Medication Prior Authorization    Name of medication: azaTHIOprine 50 mg tablet      And buPROPion, SR, (WELLBUTRIN SR) 200 mg 12 hr tablet if it has not be done already    Prescribing MD: Dr Mindi Slicker    ? Prescribed date: 04/14/19    Is there an alternative covered medication?     Has patient taken any other medications for this matter?     Insurance company phone number:     [employer] number:     Patient has been notified of the 24-48 hour turnaround time.

## 2019-04-18 NOTE — Telephone Encounter
Reply by: [name]      PA submitted through [email or web address].      [place]

## 2019-04-21 ENCOUNTER — Ambulatory Visit: Payer: BLUE CROSS/BLUE SHIELD | Attending: Audiologist

## 2019-04-21 DIAGNOSIS — H905 Unspecified sensorineural hearing loss: Secondary | ICD-10-CM

## 2019-04-21 DIAGNOSIS — H903 Sensorineural hearing loss, bilateral: Secondary | ICD-10-CM

## 2019-04-21 NOTE — Consults
PRECHART ***    Adult Audiologic Evaluation    History:   {History:31080}    ''deafness in one ear''    Objective/Results:  Otoscopy    Right ear: {Findings:31111}     Left ear:   {Findings:31111}    Tympanometry    Right ear: {Findings:31109}    Left ear:   {Findings:31109}    Acoustic reflexes    Right ear: {findings:31110}    Left ear:   {findings:31110}    Distortion Product Otoacoustic Emissions (DPOAE):    Right ear: {Findings:31360}    Left ear:   {Findings:31360}    Word recognition     Right ear: {impaired_unimpaired:31171} at an adequate intensity    Left ear:   {impaired_unimpaired:31171} at an adequate intensity    Audiological Diagnosis:   {Assessment:31081}   {AUDHLIMPACT2:31273}    Recommendations:  ? {Plan:31082}      Lilyan Gilford, AuD, 04/20/2019       Education:  Topic: {audedutopic1:31062}  Person Taught: {patient/wife/etc:31877}  Barrier: {Barrier:31058}  Needs: {Needs:31059}  Teaching Method: {Teaching Method:24200}    Outcome: {Outcome:31060}        ***insert audiogram here***  For further information, please see Audiological Assessment (located under the Notes tab of Chart Review for [facility] providers).

## 2019-04-22 DIAGNOSIS — Z452 Encounter for adjustment and management of vascular access device: Secondary | ICD-10-CM | POA: Diagnosis not present

## 2019-04-22 DIAGNOSIS — J449 Chronic obstructive pulmonary disease, unspecified: Secondary | ICD-10-CM | POA: Diagnosis not present

## 2019-04-22 DIAGNOSIS — G7000 Myasthenia gravis without (acute) exacerbation: Secondary | ICD-10-CM | POA: Diagnosis not present

## 2019-04-22 DIAGNOSIS — M797 Fibromyalgia: Secondary | ICD-10-CM | POA: Diagnosis not present

## 2019-04-22 DIAGNOSIS — J45909 Unspecified asthma, uncomplicated: Secondary | ICD-10-CM | POA: Diagnosis not present

## 2019-04-23 DIAGNOSIS — M797 Fibromyalgia: Secondary | ICD-10-CM | POA: Diagnosis not present

## 2019-04-23 DIAGNOSIS — J449 Chronic obstructive pulmonary disease, unspecified: Secondary | ICD-10-CM | POA: Diagnosis not present

## 2019-04-23 DIAGNOSIS — J45909 Unspecified asthma, uncomplicated: Secondary | ICD-10-CM | POA: Diagnosis not present

## 2019-04-23 DIAGNOSIS — G7000 Myasthenia gravis without (acute) exacerbation: Secondary | ICD-10-CM | POA: Diagnosis not present

## 2019-04-23 DIAGNOSIS — Z452 Encounter for adjustment and management of vascular access device: Secondary | ICD-10-CM | POA: Diagnosis not present

## 2019-04-24 ENCOUNTER — Ambulatory Visit: Payer: MEDICARE

## 2019-04-24 ENCOUNTER — Ambulatory Visit: Payer: BLUE CROSS/BLUE SHIELD

## 2019-04-27 ENCOUNTER — Ambulatory Visit: Payer: BLUE CROSS/BLUE SHIELD

## 2019-04-28 ENCOUNTER — Telehealth: Payer: BLUE CROSS/BLUE SHIELD

## 2019-04-28 ENCOUNTER — Ambulatory Visit: Payer: BLUE CROSS/BLUE SHIELD

## 2019-04-28 DIAGNOSIS — Z961 Presence of intraocular lens: Secondary | ICD-10-CM

## 2019-04-28 DIAGNOSIS — H02402 Unspecified ptosis of left eyelid: Secondary | ICD-10-CM

## 2019-04-28 DIAGNOSIS — H527 Unspecified disorder of refraction: Secondary | ICD-10-CM

## 2019-04-28 DIAGNOSIS — G7001 Myasthenia gravis with (acute) exacerbation: Secondary | ICD-10-CM | POA: Diagnosis not present

## 2019-04-28 NOTE — Telephone Encounter
Forwarded by: Wilba Mutz Valencia Haja Crego

## 2019-04-28 NOTE — Telephone Encounter
Call Back Request    MD:  Dr. Mindi Slicker or [place]     Reason for call back: patient would like a call back in regards to her PA for her medication infusion. Per pt she gets this every 3 weeks and is scared she might get it on time.       Any Symptoms:  []  Yes  [x]  No      ? If yes, what symptoms are you experiencing:    o Duration of symptoms (how long):    o Have you taken medication for symptoms (OTC or Rx):      Patient or caller has been notified of the 24-48 hour turnaround time.  Yes

## 2019-04-28 NOTE — Progress Notes
Assessment and Plan             Problem List        Eye / Vision Problems    1. Ptosis of eyelid      2. Refractive error     3. Pseudophakia both eyes    4. Myasthenia gravis with exacerbation (HCC/RAF)       A/P: 04/28/2019  -Rx SV dist + SV readers  -Mild base in prism in readers to help with double vision  -0.5MM Left upper lid Ptosis, has been stable for a while  -patient moved from [place] to [place], just had a full eye exam in [place] and was dilated  -FU in 1 yr, dilate + Photos next visit as base line + request for Medical info to be sent from last OMD    Orders:  No orders of the defined types were placed in this encounter.      Follow ups:  No follow-ups on file.         I discussed the above assessment and plan with the patient. She had the opportunity to ask questions, and her questions and concerns were addressed. She was reminded to call if there is any significant change or worsening in vision, or to get an evaluation, urgently if appropriate.    Author:   Orpha Bur, OD  This note details the assessment and plan of the encounter for this date. For a complete note and record of the encounter, please see the Encounter Summary.

## 2019-04-28 NOTE — Telephone Encounter
Reply by:  Valencia       Addressed via mychart.     

## 2019-04-28 NOTE — Telephone Encounter
Thanks [place].  Please start the paperwork and please coordinate this effort if need be with our colleagues in neurology.    She simply needs the medication!!

## 2019-05-02 ENCOUNTER — Ambulatory Visit: Payer: BLUE CROSS/BLUE SHIELD | Attending: Audiologist

## 2019-05-02 DIAGNOSIS — H903 Sensorineural hearing loss, bilateral: Secondary | ICD-10-CM

## 2019-05-02 NOTE — Patient Instructions
The report is viewable in [facility] ''MyChart''.  It is under clinical notes. To access my note:  1. Hover your mouse over the ''Visits'' icon.   2. Click on the ''Appointments and Visits'' link.   3. Click on the desired visit.   4. Click on the ''Clinical Notes'' tab located below the ''Appointment Details'' header.   5. Listed are the types of Ambulatory Notes you may see:   -Progress Notes   -Consult   -Procedure   -H&P   -Goals of Care   -Interdisciplinary Notes   **All of Audiology visit notes (Reports) are considered ''Consult Notes''.     Please call [phone] for additional help if you still cannot see notes.     A copy of the audiogram/reports can be obtained by contacting Medical Records:    Phone: [facility] at [phone]    In person during the hours of 8:00 AM-4:30 PM, Monday-Friday  [place]- [address], [address] [place], [address] [address]  [place]- [address], [address], [place] [address]      Thank you,    M Magali Bray, AuD

## 2019-05-02 NOTE — Consults
Audiology Rehabilitation Consultation    The purposes of this appointment are, first, to determine whether any assistive hearing technology is appropriate and desired by the patient (and if so, whether any of the following is indicated: hearing aids, cochlear implants, bone-anchored devices, other assistive listening technologies), and secondly, to provide information and strategies to maximizing communication in  a variety of situations (including face-to-face, phone, large-group, television, etc).      History:   A recent audiological evaluation showed that [name] has a bilateral sensorineural hearing loss.  The type and degree of [name]?s hearing loss warrants consideration of amplification, and [name] expressed interest in amplification options to address communication difficulties.      Based on the patient?s hearing loss and individual needs and preferences, rehabilitation strategies and device options were discussed with the patient and her daughter, [name].     ? [name] has a longstanding hearing loss of deafness in the left ear and has never worn a hearing aid on that side.   ? She is a long term hearing aid user in the right ear and was most recently fit with a Phonak RIC style device several years ago with her previous audiologist in [place]. That hearing aid was recently lost and she is now a [place] resident.  ? [name] will soon be moving into a retirement community but is currently living with her daughter and enjoys several family events/dinners where she finds herself struggling to hear.   ? [name] reported significant benefit with her right hearing aid and overall enjoyed the experience/sound quality of the device.   ? Today, [name] shared she had tried and discussed several devices with her previous audiologist including: custom ear pieces, which she did not prefer and returned to a dome; a BI-CROS which she never tried due to concerns for too much noise and perceived benefit with one hearing aid; and a cochlear implant which she is not interested in.     Patient Education:  The patient was educated on the impact of hearing loss on speech perception and speech acoustics.  The impact of his/her personal hearing loss on speech understanding in a variety of listening conditions was explained.  The concepts of audibility loss and clarity loss (distortion) were discussed. The impact of noise, reverberation and distance on speech understanding was discussed, and factors related to the speaker (accent, speech production, etc) were also discussed.      Communication skills and practices were discussed and demonstrated.  The importance of managing environmental noise, reverberation, distance was emphasized, and various strategies were recommended.  The patient was encouraged to practice assertive communication strategies.      The impact of hearing impairment on psycho-social well-being, as well as possible relationship to depression and dementia, were discussed.  The importance of being able to sustain communication with family and friends, as well as in the work environment, were discussed.    The probable benefits of various amplification options were presented and discussed with [name] and her daughter.      Amplification Options:  In keeping with a patient-centered care approach, the patient/family were encouraged to express their amplification preferences (type, style, manufacturer, cost, as appropriate).  Options for amplification were discussed with the patient and family, including personal hearing aids, cochlear implants, bone-anchored device(s), and other assistive technologies as indicated.     Hearing aid styles were outlined, with specific recommendations made for the degree/configuration of the patient?s hearing loss as well as the physical size  and shape of the external ear.  The patient?s preferences and budget were discussed and considered.      The limitations of amplification (hearing aid, bone-anchored, cochlear implant, and other assistive technologies) were discussed: amplification of undesired signals (?noise?) as well as desired signals (?speech?), the limitations of amplification when desired signals are at a distance, and the need for a period of accommodation or adjustment to new auditory signals.    ? As previously discussed, it was recommended [name] remain using Phonak given her long term use and overall satisfaction; [name] agreed.   ? [name] and her daughter were informed of newer features that have been released since she was last fit with hearing aids, including: ability for direct streaming from an Theme park manager, Chief Technology Officer batteries, and user adjustability through Museum/gallery exhibitions officer.   ? Clair declined interest/need for rechargeable batteries and user/manual adjustability.   ? She stated a better experience with land line telephones as compared to her cell phone, but would try streaming if able.   ? [name] was counseled extensively regarding the opportunity to trial a BI-CROS hearing device. Limitations for localization and noise reduction were discussed. The patient was informed that use of a BI-CROS would give her greater awareness of sounds coming from the left side of her body and therefore eliminate the need to turn her head or position herself strategically when around others.   ? Further, [name] was counseled regarding the possible adjustment period for a BI-CROS as additional sound/noise would be introduced as compared to what she is used to with use of only one hearing aid.   ? [name] was informed of the trial period and it was decided a non-rechargeable BI-CROS system be ordered. If she does not prefer this set up then a single hearing aid in the right ear would be fit.   ? See below for device recommendations.     [name] is a candidate for hearing aid in the left ear. The various styles of hearing aids were outlined, with specific recommendations made for the degree/configuration of the patient's hearing loss as well as the physical size and shape of the external ear.    The following hearing aids are recommended for initial fitting:  ? RIGHT: Manufacturer Phonak, Model Audeo B50-Direct 312T, Receiver Standard, Color 01-H0 (Beige), Speaker Length 2, Battery size 312.  ? LEFT: Manufacturer Phonak, Model CROS-B, Color 01-H0 (Beige), Speaker Length 1, Battery size 312.  ? Patient was quoted $1450 for Audeo and $1400 for CROS ($2850.00 total).  ? If patient is not satisfied with BI-CROS system, the following hearing aid will be ordered:   ? RIGHT: Manufacturer Phonak, Model Paradise P50, Receiver Standard, Color 01-H0 (Beige), Speaker Length 2, Battery size 312.  ? Patient was quoted $1575.00.  ? Patient was provided manufacturer brochure for [facility] B-Direct hearing aid.     In keeping with state of [place] requirements for hearing aid dispensing, the patient was informed of the 45-day right-to-return period.    The estimated time spent with the patient/family was 60 minutes.    Recommendations:   ? Return to [facility] in 3-4 weeks for hearing aid fitting.     Alvina Chou, AuD,  05/02/2019   Audiologist     Education:  Topic: Hearing aid(s)  Person Taught: patient and daughter  Barrier: None  Needs: Knows well  Teaching Method: Teacher, English as a foreign language and Verbal instruction    Outcome: Able to repeat information

## 2019-05-04 DIAGNOSIS — G7000 Myasthenia gravis without (acute) exacerbation: Secondary | ICD-10-CM | POA: Diagnosis not present

## 2019-05-04 DIAGNOSIS — M797 Fibromyalgia: Secondary | ICD-10-CM | POA: Diagnosis not present

## 2019-05-04 DIAGNOSIS — J45909 Unspecified asthma, uncomplicated: Secondary | ICD-10-CM | POA: Diagnosis not present

## 2019-05-04 DIAGNOSIS — J449 Chronic obstructive pulmonary disease, unspecified: Secondary | ICD-10-CM | POA: Diagnosis not present

## 2019-05-04 DIAGNOSIS — Z452 Encounter for adjustment and management of vascular access device: Secondary | ICD-10-CM | POA: Diagnosis not present

## 2019-05-05 ENCOUNTER — Ambulatory Visit: Payer: BLUE CROSS/BLUE SHIELD

## 2019-05-05 ENCOUNTER — Telehealth: Payer: MEDICARE

## 2019-05-05 ENCOUNTER — Telehealth: Payer: BLUE CROSS/BLUE SHIELD

## 2019-05-05 ENCOUNTER — Ambulatory Visit: Payer: MEDICARE

## 2019-05-05 DIAGNOSIS — R1012 Left upper quadrant pain: Secondary | ICD-10-CM

## 2019-05-05 MED ORDER — BUPROPION HCL ER (SR) 200 MG PO TB12
200 mg | ORAL_TABLET | Freq: Two times a day (BID) | ORAL | 3 refills | Status: AC
Start: 2019-05-05 — End: 2019-05-08

## 2019-05-05 MED ORDER — DEXLANSOPRAZOLE 60 MG PO CPDR
60 mg | ORAL_CAPSULE | Freq: Every day | ORAL | 3 refills
Start: 2019-05-05 — End: ?

## 2019-05-05 MED ORDER — DEXLANSOPRAZOLE 60 MG PO CPDR
60 mg | ORAL_CAPSULE | Freq: Every day | ORAL | 3 refills | Status: AC
Start: 2019-05-05 — End: 2019-05-08

## 2019-05-05 MED ORDER — BUPROPION HCL ER (SR) 200 MG PO TB12
200 mg | ORAL_TABLET | Freq: Two times a day (BID) | ORAL | 3 refills
Start: 2019-05-05 — End: ?

## 2019-05-05 NOTE — Telephone Encounter
Good Morning,    I called patient to verified medications and dosing Instructions for the medications being requested.  She only needs the Wellbutrin SR and the Dexlansoprazole to submit PA  to [employer].    Thanks so much ,    Viviano Simas, [place]    PRESCRIPTION REFILL REQUEST      Date last seen: 04/28/2019  Next appointment: 06/03/2019    Last refilled: 03/09/2019, Dispensed: 180, #Refill/s:  3  Last refilled: 12/17/2018, Dispensed: 90, #Refill/s: 3    Requested Prescriptions     Pending Prescriptions Disp Refills   ? dexlansoprazole 60 mg DR capsule 90 capsule 3     Sig: Take 1 capsule (60 mg total) by mouth daily.   ? buPROPion, SR, (WELLBUTRIN SR) 200 mg 12 hr tablet 180 tablet 3     Sig: Take 1 tablet (200 mg total) by mouth two (2) times daily.     Note for Pharmacy: please do not change, Patient does better with Wellbutrin SR brand.    Deferring to you for review/ approval    Thanks,    Byrd Hesselbach

## 2019-05-05 NOTE — Telephone Encounter
What medication

## 2019-05-05 NOTE — Telephone Encounter
Message to Practice/Provider    MD: Dr. Jefm Bryant     Message: Patient's PCP office reached our regarding P.A for gammaguard. Advised pt reached out to them regarding the matter. Stated that it's urgent as they have been reaching out regarding the matter with no success. Please assist at earliest convenience. Thank you.          Return call is not being requested by the patient or caller.    Patient or caller has been notified of the 24-48 hour processing turnaround time if applicable.

## 2019-05-05 NOTE — Telephone Encounter
Forwarded by: Judge Stall morning [name],    Are you able to answers these questions so I can do the PA?    [place]

## 2019-05-05 NOTE — Telephone Encounter
Medication Prior Authorization    Name of medication: GAMMAGARD 30 GM/300ML SOLN  wellbutrin 200 mg sr  dexlansoprazole 60 mg DR capsule    Pt states to submit to [facility] MD: Dr Mindi Slicker    ? Prescribed date:     Is there an alternative covered medication?     Has patient taken any other medications for this matter?     Insurance company phone number:  [phone] fax number:     Patient has been notified of the 24-48 hour turnaround time.

## 2019-05-06 ENCOUNTER — Ambulatory Visit: Payer: BLUE CROSS/BLUE SHIELD

## 2019-05-06 MED ORDER — LIDOCAINE HCL URETHRAL/MUCOSAL 2 % EX GEL
INTRAMUSCULAR | 1 refills | 20.00000 days | Status: AC
Start: 2019-05-06 — End: 2019-05-08

## 2019-05-06 MED ORDER — LIDOCAINE-PRILOCAINE 2.5-2.5 % EX CREA
TOPICAL | 1 refills | 30.00000 days
Start: 2019-05-06 — End: ?

## 2019-05-06 NOTE — Telephone Encounter
Called [facility], Ph# [phone], spoke with the pharmacist who informed me patient is covered at 100% through financial hardship. I thanked her and informed her I will call patient to clarify, call ended.

## 2019-05-07 ENCOUNTER — Ambulatory Visit: Payer: BLUE CROSS/BLUE SHIELD

## 2019-05-07 DIAGNOSIS — R102 Pelvic and perineal pain: Secondary | ICD-10-CM

## 2019-05-07 DIAGNOSIS — R198 Other specified symptoms and signs involving the digestive system and abdomen: Secondary | ICD-10-CM

## 2019-05-07 DIAGNOSIS — R109 Unspecified abdominal pain: Secondary | ICD-10-CM

## 2019-05-07 DIAGNOSIS — R3989 Other symptoms and signs involving the genitourinary system: Secondary | ICD-10-CM

## 2019-05-07 DIAGNOSIS — G8929 Other chronic pain: Secondary | ICD-10-CM

## 2019-05-07 NOTE — Telephone Encounter
Faxed out clinical notes from 02/16: continue IVIg 60g (1g/kg) Q 3 weeks:

## 2019-05-08 MED ORDER — BUPROPION HCL ER (SR) 200 MG PO TB12
200 mg | ORAL_TABLET | Freq: Two times a day (BID) | ORAL | 3 refills | Status: AC
Start: 2019-05-08 — End: ?

## 2019-05-08 MED ORDER — LIDOCAINE 5 % EX PTCH
1 | MEDICATED_PATCH | Freq: Two times a day (BID) | TRANSDERMAL | 3 refills | Status: AC
Start: 2019-05-08 — End: ?

## 2019-05-08 MED ORDER — LIDOCAINE-PRILOCAINE 2.5-2.5 % EX CREA
Freq: Two times a day (BID) | TOPICAL | 3 refills | Status: AC | PRN
Start: 2019-05-08 — End: ?

## 2019-05-08 MED ORDER — DEXLANSOPRAZOLE 60 MG PO CPDR
60 mg | ORAL_CAPSULE | Freq: Every day | ORAL | 3 refills | Status: AC
Start: 2019-05-08 — End: ?

## 2019-05-08 NOTE — Telephone Encounter
Reply by: [name]      Good morning Dr. Mindi Slicker,    A PA was only needed for the Wellbutrin. It was submitted and approved on 05/05/19.    Have a great day,  [place]

## 2019-05-08 NOTE — Consults
PATIENT: [name]  MRN: [id number]  DOB: [birth year]  DATE OF SERVICE: 05/07/2019    PRIMARY CARE PROVIDER: York Ram., MD    REASON FOR REFERRAL:     Chief Complaint   Patient presents with   ? Pelvic Pain       Subjective:       History of Present Illness:  [name] is a 84 y.o. female G 2 P 2 w hx interstitial cystitis, IBS  and  myasthenia gravis,   fibromyalgia  referred by Dr Lorenz Coaster for consult re chronic pelvic pain. Dr baxter said she does not have IC.    pain is 2-3/10 suprapubic, can get to 5-6/10   pain inc w stress bm, walking bending full bladder urination   better w ag valium vag and klonopin   No bleeding   not sexually active    had a rectocele : after repair bladder pain decreased   GU:  + urgency frequency bladder pain. Nocturia w + oleary sant   GI ibs alt   Msk: fibromyalgia, myasthenia      Pertinent information and medical history was obtained from the patient and additional history was available from review of the medical records.        Outpatient Medications Prior to Visit   Medication Sig   ? Albuterol Sulfate AEPB Inhale Takes 1 to 2 puffs every 4 hours as needed for wheezing or SOB .   ? azaTHIOprine 50 mg tablet Take 1.5 tablets (75 mg total) by mouth two (2) times daily.   ? buPROPion, SR, (WELLBUTRIN SR) 200 mg 12 hr tablet Take 1 tablet (200 mg total) by mouth two (2) times daily.   ? calcium carbonate 500 mg chewable tablet Chew 1 tablet by mouth daily Calcium Carbonate 500 mg is equivalent to 200 mg elemental Calcium .   ? calcium carbonate 500 mg chewable tablet Chew by mouth.   ? clonazePAM 1 mg disintegrating tablet Take 1 tablet (1 mg total) by mouth at bedtime. Max Daily Amount: 1 mg   ? clonazePAM 1 mg tablet Take 1 tablet (1 mg total) by mouth three (3) times daily. Max Daily Amount: 3 mg   ? dexlansoprazole 60 mg DR capsule Take 1 capsule (60 mg total) by mouth daily.   ? GAMMAGARD 30 GM/300ML SOLN    ? lidocaine 2% jelly Apply to affected area daily prn.   ? lidocaine-prilocaine 2.5-2.5% cream    ? meclizine 12.5 mg tablet Take 1 tablet (12.5 mg total) by mouth three (3) times daily as needed.   ? montelukast 10 mg tablet Take 10 mg by mouth as needed for.   ? phenazopyridine 200 mg tablet Take 1 tablet (200 mg total) by mouth three (3) times daily with meals.   ? pyridostigmine 60 mg tablet Take 1 tablet (60 mg total) by mouth three (3) times daily.   ? trimethoprim 100 mg tablet Take 1 tablet (100 mg total) by mouth daily.   ? aluminum hydroxide-magnesium trisilicate 80-20 mg chewable tablet Chew by mouth.   ? fosfomycin 3 g powder packet Take 1 packet by mouth once (Dissolve packet in 90 mL of water. For partial package doses, give appropriate portion.) .   ? nitrofurantoin 100 mg capsule nitrofurantoin monohydrate/macrocrystals 100 mg capsule     No facility-administered medications prior to visit.      Allergies   Allergen Reactions   ? Beta Adrenergic Blockers Other (See Comments)  Avoid d/t Myasthenia Gravis  Avoid d/t Myasthenia Gravis     ? Macrolides And Ketolides Other (See Comments)     Avoid d/t Myasthenia Gravis  Use with caution d/t Myasthenia Gravis  Use with caution d/t Myasthenia Gravis     ? Sulfa Antibiotics Other (See Comments)     May possibly have caused deafness in one ear  May possibly have caused deafness in one ear  other     ? Botulinum Toxins Other (See Comments)     Muscle weakness r/t Myasthenia Gravis  Muscle weakness r/t Myasthenia Gravis     ? Statins Other (See Comments)     Muscle aches  Muscle aches  Muscle aches     ? Plastic Tape Itching and Other (See Comments)     Turns the skin red where applied     No family history on file.  Social History     Socioeconomic History   ? Marital status: Widowed     Spouse name: Not on file   ? Number of children: Not on file   ? Years of education: Not on file   ? Highest education level: Not on file   Occupational History   ? Not on file   Social Needs   ? Financial resource strain: Not on file   ? Food insecurity     Worry: Not on file     Inability: Not on file   ? Transportation needs     Medical: Not on file     Non-medical: Not on file   Tobacco Use   ? Smoking status: Former Smoker     Quit date: 1985     Years since quitting: 36.2   ? Smokeless tobacco: Former Estate agent and Sexual Activity   ? Alcohol use: Not on file   ? Drug use: Not on file   ? Sexual activity: Not on file   Lifestyle   ? Physical activity     Days per week: Not on file     Minutes per session: Not on file   ? Stress: Not on file   Relationships   ? Social Wellsite geologist on phone: Not on file     Gets together: Not on file     Attends religious service: Not on file     Active member of club or organization: Not on file     Attends meetings of clubs or organizations: Not on file     Relationship status: Not on file   Other Topics Concern   ? Not on file   Social History Narrative   ? Not on file     Patient Active Problem List    Diagnosis Date Noted   ? Refractive error 04/28/2019   ? Ptosis of eyelid 04/28/2019   ? Pseudophakia of both eyes 04/28/2019   ? Benzodiazepine dependence (HCC/RAF) 12/20/2018   ? Breast cancer (HCC/RAF) 12/20/2018   ? Spondylosis without myelopathy or radiculopathy, lumbar region 11/28/2018   ? Anxiety 11/27/2018   ? CKD (chronic kidney disease), stage III 11/27/2018   ? Nocturnal hypoxemia 04/07/2018     Last Assessment & Plan:   We discussed potential impact of hypoxemia from her COPD on other body function  Plan-overnight oximetry     ? Major depression, recurrent, chronic (HCC/RAF) 12/16/2017   ? Diabetes mellitus (HCC/RAF) 12/16/2017   ? Carcinoma in situ of breast 07/24/2017     Last Assessment &  Plan:   07/19/2017?Screening detected right breast calcifications and distortion. ?The distortion was in the upper outer quadrant: Biopsy DCIS intermediate grade with CSL; calcifications UIQ: 2.8 cm: Biopsy DCIS+ ALH +CSL; 4 cm apart, axilla negative, Tis NX stage 0  ?  Significant delay in decision making because of psychosocial issues and myasthenia gravis.  Patient refused surgery and went on Tamoxifen 10 mg daily for 1 month and discontinued it.  Started anastrozole 01/30/1999 19/2 a tablet daily    Anastrozole toxicities:    Breast cancer surveillance: Mammogram 08/15/2018: Biopsy-proven sites of DCIS and ALH are stable, breast density category C    Continue every 60-month mammograms and follow-up.     ? Osteoarthritis of knee 05/17/2017   ? H/O arthroscopy 03/06/2017   ? Myasthenia gravis with exacerbation (HCC/RAF) 12/18/2016   ? Vaginal atrophy 08/16/2016   ? Acute medial meniscal tear, left, subsequent encounter 07/18/2016   ? Renal cyst 03/12/2016   ? Asthmatic bronchitis, mild persistent, with acute exacerbation 12/29/2015     Last Assessment & Plan:   Continue rescue inhaler as needed. Try adding sample Bevespi maintenance inhaler to see if this helps.  She is currently on prednisone 20 mg daily maintenance and I don't think adding a steroid inhaler component will make any difference.     ? Urethral caruncle 07/26/2015   ? Chronic cholecystitis 01/14/2015   ? Angioedema 05/23/2014     Attributed to injected methylprednisolone    Last Assessment & Plan:   We discussed limited ability to test for possible triggers. We are asking our labs sources about ability to test against methylprednisolone for injection.  A different brand might be worth trying. She can try pre-medicating with an antihistamine.     ? Chronic interstitial cystitis 10/01/2011   ? Insomnia 05/31/2007     Qualifier: Diagnosis of   By: Maple Hudson MD, Clinton D    Last Assessment & Plan:   I'm concerned that some of her sleep problems may reflect obstructive sleep apnea based on her palate and which she can tell me about her sleep patterns without a witness. We also want to know about oxygenation during sleep.  Plan-schedule sleep study  Qualifier: Diagnosis of   By: Maple Hudson MD, Clinton D      Last Assessment & Plan:   I'm concerned that some of her sleep problems may reflect obstructive sleep apnea based on her palate and which she can tell me about her sleep patterns without a witness. We also want to know about oxygenation during sleep.  Plan-schedule sleep study     ? COPD mixed type (HCC/RAF) 04/30/2007     Office Spirometry 03/16/2016-severe obstructive airways disease FVC 1.59/59%, FEV1 0.98/49%, ratio 0.62, FEF 25?75% 0.42/28%  PFT 04/16/16-severe obstructive airways disease with slight response to bronchodilator, severe diffusion defect. FEV1/FVC 0.64, DLCO 45%    Last Assessment & Plan:   Severe obstructive airways disease. She can't hear herself wheeze.  Plan-try samples of Trelegy instead of Anoro for comparison    Overview:   Office Spirometry 03/16/2016-severe obstructive airways disease FVC 1.59/59%, FEV1 0.98/49%, ratio 0.62, FEF 25?75% 0.42/28%  PFT 04/16/16-severe obstructive airways disease with slight response to bronchodilator, severe diffusion defect. FEV1/FVC 0.64, DLCO 45%    Last Assessment & Plan:   Severe obstructive airways disease. She can't hear herself wheeze.  Plan-try samples of Trelegy instead of Anoro for comparison  Office Spirometry 03/16/2016-severe obstructive airways disease FVC 1.59/59%, FEV1 0.98/49%, ratio 0.62, FEF 25?75% 0.42/28%  PFT 04/16/16-severe obstructive airways disease with slight response to bronchodilator, severe diffusion defect. FEV1/FVC 0.64, DLCO 45%    Last Assessment & Plan:   Severe COPD but mainly emphysema with limited bronchospasm component that could be addressed with bronchodilators.  Plan-try sample Bevespi as a maintenance controller, refill pro-air     ? Esophageal reflux 04/30/2007     History of fundoplication     Last Assessment & Plan:   Emphasized continued attention to antireflux measures. Reminded that reflux can be a significant trigger for irritable airways, cough and wheeze.  History of fundoplication       Last Assessment & Plan:   Emphasized continued attention to antireflux measures. Reminded that reflux can be a significant trigger for irritable airways, cough and wheeze.     ? 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 Management is mostly by her ENT physicians.     ? Fibromyalgia 04/30/2007     Qualifier: Diagnosis of   By: Yetta Barre CNA/MA, Rose Fillers: Diagnosis of   By: Yetta Barre CNA/MA, Shanda Bumps     ? Seasonal and perennial allergic rhinitis 04/30/2007     Qualifier: Diagnosis of   By: Yetta Barre CNA/MA, Shanda Bumps     Last Assessment & Plan:   She is now on prednisone 20 mg daily. I suggested she wait until her myasthenia status is stabilized. Then if she needs to she couldn't seek evaluation at one of the allergy practices in town as discussed, since our vaccine program will be closing.  Qualifier: Diagnosis of   By: Yetta Barre CNA/MA, Jessica       Last Assessment & Plan:   She is now on prednisone 20 mg daily. I suggested she wait until her myasthenia status is stabilized. Then if she needs to she couldn't seek evaluation at one of the allergy practices in town as discussed, since our vaccine program will be closing.           Review of Systems:  Gen: no weight change, fatigue, Skin: no rash, pruritus, lesions, Breast: no lumps, pain, discharge, Eyes: negative,  Ears, nose, mouth, throat, and face: negative, HEENT: no headaches, visual change, nasal problems, dental issues, neck problems, Cardio: no pain, palp, DOE, edema, Resp: no pain, dyspnea, wheeze, cough, GI: no change in appetite, pain, reflux, nausea, bowel changes, GU: see HPI, Musc: no pain, swelling, loss of range, Neuro-psych: no sensory, motor, muscular, mood changes, Immun: no allergic reactions, hematologic, adenopathy, Integument/breast: negative, Hematologic/lymphatic: negative, Neurological: negative, Behavioral/Psych: negative, Endocrine: negative, Allergic/Immunologic: negative          Objective:      Physical Exam:  BP 165/83  ~ Pulse (!) 116  ~ Temp 36.3 ?C (97.3 ?F)  ~ Ht 5' 4'' (1.626 m)  ~ Wt 138 lb (62.6 kg)  ~ BMI 23.69 kg/m?   General: well-developed, well-nourished, no distress       Abdomen: soft,  Tender + carnett's test , non-distended, no hepatosplenomegaly or masses  Pelvic examination:  EGBUS: no lesions, no inguinal adenopathy  Vagina: no discharge or lesions   tender muscles levator and bulbo   no cystocele or rectocele  Cervix: within normal limits, no discharge, no lesions; no CMT  Uterus: normal size, shape and contour  Adnexa: no masses or tenderness  Assessment:    long standing pelvic pain   most c/w bladder pain syndrome and myofascial pain abd wall pelvic floor   IBS alt   fibromyaogia   anxiety       Plan/ Recommendation:    start pelvic floor PT\   trial of lido patches abd wall  Vag valium 5 mg suppos # 30 refill 2   F/u 4 mos       [name] has had all questions answered satisfactorily and is in agreement with this recommended plan of care.      45   Min visit       cc York Ram., MD dr Earna Coder baxter        Author: Johnney Killian. Barbara Keng 05/07/2019 4:21 PM

## 2019-05-08 NOTE — Telephone Encounter
[place],  Did we submit the PA already??

## 2019-05-20 NOTE — Consults
Adult Hearing Aid Fitting    [name] was fitted with a BiCROS system.     Hearing Aid(s)  Manufacturer: [employer]  Right Ear Model: Audeo B70   Left Ear Model: CROS-B   Right SN: [id number]  Left SN:  [id number]  Speaker Length & Power: 2S  Right Ear Dome: Large closed  Left Ear Dome: Medium open  Battery Size: 13  Initial HAF: 05/23/2019  Right Hearing Aid Warranty Expiration: 08/09/2022  Left CROS Warranty Expiration: 08/08/2021  Right to Return Expiration Date: 07/06/2019    Accessories dispensed: 2 boxes of batteries, wax guards, additional domes, cleaning and care kit.       Fitting:  Hearing aids were verified by applying, real-ear-measures (REM) to an evidence-based fitting algorithm, NAL-NL2.  Hearing aid(s) were optimized to achieve the best speech intelligibility index given the degree and configuration of the hearing loss and the limitations of hearing aid amplification.    ? Hearing aid was verified to target; BiCROS system was activated and verified to same targets. See below.     Programming:   ? Gain: 100%   ? Feedback manager was fun.   ? Manual controls:   ? Button: No function  ? Volume control: activated for right ear only  ? No additional programs.    Hearing Aid Orientation:  Patient is a long-term, experienced, user much of the orientation was review. Patient independently placed and removed hearing aids, hearing aid batteries, and demonstrated volume control.     Patient was instructed in care and use of hearing aids.  Techniques for insertion and removal, changing battery, and cleaning were demonstrated and discussed.  Troubleshooting was discussed.  The necessity for full-time hearing aid use was emphasized.  Battery safety and battery disposal were discussed, and written information provided.   Patient was instructed to ensure that batteries could not be handled nor ingested by a child/pets.     The benefits and limitations of hearing aids were discussed.  Patient was counseled regarding acclimatization period necessary for adjustment to new amplification.   Communication strategies, including importance of avoiding noise, reverberation and distance, were discussed.  The advantages of face-to-face communication, and use of visual speech cues (speech reading/lipreading) were emphasized.   The necessity for conversation at a near distance, in a relatively quiet environment, and facing the speaker so that visual as well as optimal auditory cues are available was emphasized.  Assertive communication techniques were advised and demonstrated.    The limitations of hearing aid amplification were also discussed.  The benefits of remote microphone technology during communication in noisy and distant environments were discussed.        Plan:  ? Return in 2 weeks for hearing aid follow up.  ? Verify warranty information with Phonak at next appointment and confirm with patient.  ? Full time hearing aid was advised  ? Patient was advised to return for regularly-scheduled hearing aid checks, and to contact the clinic immediately if hearing aids appeared to be malfunctioning, after troubleshooting at home.     ? Patient was advised to have hearing re-evaluated every 1-2 years or as needed in order that hearing aids can be re-programmed if hearing changes.        Alvina Chou, AuD, 05/23/2019  Audiologist       Education:  Topic: Hearing aids  Person Taught: patient    Barrier: None    Needs: Knows well    Teaching Method: Demonstration, Teacher, English as a foreign language and Verbal  instruction    Outcome: Able to repeat information and/or demonstrate what was taught

## 2019-05-21 ENCOUNTER — Ambulatory Visit: Payer: BLUE CROSS/BLUE SHIELD

## 2019-05-21 DIAGNOSIS — S161XXD Strain of muscle, fascia and tendon at neck level, subsequent encounter: Secondary | ICD-10-CM

## 2019-05-21 DIAGNOSIS — M47816 Spondylosis without myelopathy or radiculopathy, lumbar region: Secondary | ICD-10-CM

## 2019-05-21 DIAGNOSIS — M7918 Myalgia, other site: Secondary | ICD-10-CM

## 2019-05-21 DIAGNOSIS — G8929 Other chronic pain: Secondary | ICD-10-CM

## 2019-05-21 DIAGNOSIS — M542 Cervicalgia: Secondary | ICD-10-CM

## 2019-05-21 DIAGNOSIS — F419 Anxiety disorder, unspecified: Secondary | ICD-10-CM

## 2019-05-21 NOTE — Patient Instructions
American Ginseng Tea (made in [place])  Recommended to help increase energy for select people.    Brew in thermos or cup of hot water.   Drink 2-3 times a day. Avoid drinking in the late afternoon or evening to avoid interfering with sleep. Use new tea bag each day.    [place]      [place]

## 2019-05-21 NOTE — Progress Notes
[facility] Progress Note     PATIENT: [name]  MRN: [id number]  DOB: Jul 30, 1935  DATE OF SERVICE: 05/21/2019  REFERRING PRACTITIONER: No ref. provider found  PRIMARY CARE PROVIDER: York Ram., MD    Chief Complaint   Patient presents with   ? Neck Pain   ? Knee Pain     left     Subjective:      History of Present Illness:  [name] is a 84 y.o. y.o. female with PMH of  has no past medical history on file. who presents for follow-up cervical and lumbar spondylosis.     Follow-up 05/21/2019  - feels the treatments last visit did help reduce her pain temporarily   - still with persistent neck pain and left knee weakness  - got neck and shoulder massager and using it at home  - purchased TENS unit but forgot to bring it today  - [place]    Initial Consult 04/16/19  # Chronic neck and shoulder pain  - present for many years, gradual onset, no inciting event or trauma. Underlying myasthenia gravis.   - Prior work-up: MR cervical spine with multilevel degenerative disc disease in setting of C4-C7 congenital cervical spinal canal stenosis, neural foraminal narrowing, facet joint degeneration   - Interventions: no medications for pain; was going to start PT but didn't go due to pandemic  - Self-care: cervical collar sometimes if needed to bend down  - Timing/Duration: constant, associated with activities   - Location/Radiation: bilateral neck, no radiation  - Quality: aching, sharp at times  - Severity: varies between 5 to 10 out of 10  - Modifiers: worse with activity. better with rest.   - Associated s/s: diffuse generalized weakness of bilateral lower extremities (thinks its related to myasthenia gravis), leg pain  - Denies: numbness, tingling, upper extremity weakness, changes in bowel or bladder, changes in gait  ?  # Myasthenia gravis  - hospitalized 11/2018 for exacerbation, receiving IVIG infusions regularly  - uses a walker / cane sometimes  ?  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 Husband died 2 years ago of CHF.   BM: intermittent constipation, diarrhea   Temperature: tends to run cold  Supplements: none    Review of Systems:  A 14 point review of systems was completed on intake and fully reviewed with patient.  Pertinent areas discussed and documented in HPI or noted below.       Medications that the patient states to be currently taking   Medication Sig   ? Albuterol Sulfate AEPB Inhale Takes 1 to 2 puffs every 4 hours as needed for wheezing or SOB .   ? azaTHIOprine 50 mg tablet Take 1.5 tablets (75 mg total) by mouth two (2) times daily.   ? buPROPion, SR, (WELLBUTRIN SR) 200 mg 12 hr tablet Take 1 tablet (200 mg total) by mouth two (2) times daily.   ? calcium carbonate 500 mg chewable tablet Chew 1 tablet by mouth daily Calcium Carbonate 500 mg is equivalent to 200 mg elemental Calcium .   ? calcium carbonate 500 mg chewable tablet Chew by mouth.   ? clonazePAM 1 mg disintegrating tablet Take 1 tablet (  1 mg total) by mouth at bedtime. Max Daily Amount: 1 mg   ? clonazePAM 1 mg tablet Take 1 tablet (1 mg total) by mouth three (3) times daily. Max Daily Amount: 3 mg   ? dexlansoprazole 60 mg DR capsule Take 1 capsule (60 mg total) by mouth daily.   ? GAMMAGARD 30 GM/300ML SOLN    ? lidocaine 5% patch Place 1 patch onto the skin every twelve (12) hours 12 hours on 12 hours off..   ? lidocaine-prilocaine 2.5-2.5% cream    ? meclizine 12.5 mg tablet Take 1 tablet (12.5 mg total) by mouth three (3) times daily as needed.   ? montelukast 10 mg tablet Take 10 mg by mouth as needed for.   ? nitrofurantoin 100 mg capsule nitrofurantoin monohydrate/macrocrystals 100 mg capsule   ? phenazopyridine 200 mg tablet Take 1 tablet (200 mg total) by mouth three (3) times daily with meals.   ? pyridostigmine 60 mg tablet Take 1 tablet (60 mg total) by mouth three (3) times daily.   ? trimethoprim 100 mg tablet Take 1 tablet (100 mg total) by mouth daily.     Allergies   Allergen Reactions   ? Beta Adrenergic Blockers Other (See Comments)     Avoid d/t Myasthenia Gravis  Avoid d/t Myasthenia Gravis     ? Macrolides And Ketolides Other (See Comments)     Avoid d/t Myasthenia Gravis  Use with caution d/t Myasthenia Gravis  Use with caution d/t Myasthenia Gravis     ? Sulfa Antibiotics Other (See Comments)     May possibly have caused deafness in one ear  May possibly have caused deafness in one ear  other     ? Botulinum Toxins Other (See Comments)     Muscle weakness r/t Myasthenia Gravis  Muscle weakness r/t Myasthenia Gravis     ? Statins Other (See Comments)     Muscle aches  Muscle aches  Muscle aches     ? Plastic Tape Itching and Other (See Comments)     Turns the skin red where applied     Patient Active Problem List    Diagnosis Date Noted   ? Refractive error 04/28/2019   ? Ptosis of eyelid 04/28/2019   ? Pseudophakia of both eyes 04/28/2019   ? Benzodiazepine dependence (HCC/RAF) 12/20/2018   ? Breast cancer (HCC/RAF) 12/20/2018   ? Spondylosis without myelopathy or radiculopathy, lumbar region 11/28/2018   ? Anxiety 11/27/2018   ? CKD (chronic kidney disease), stage III 11/27/2018   ? Nocturnal hypoxemia 04/07/2018     Last Assessment & Plan:   We discussed potential impact of hypoxemia from her COPD on other body function  Plan-overnight oximetry     ? Major depression, recurrent, chronic (HCC/RAF) 12/16/2017   ? Diabetes mellitus (HCC/RAF) 12/16/2017   ? Carcinoma in situ of breast 07/24/2017     Last Assessment & Plan:   07/19/2017?Screening detected right breast calcifications and distortion. ?The distortion was in the upper outer quadrant: Biopsy DCIS intermediate grade with CSL; calcifications UIQ: 2.8 cm: Biopsy DCIS+ ALH +CSL; 4 cm apart, axilla negative, Tis NX stage 0  ?  Significant delay in decision making because of psychosocial issues and myasthenia gravis.  Patient refused surgery and went on Tamoxifen 10 mg daily for 1 month and discontinued it.  Started anastrozole 01/30/1999 19/2 a tablet daily    Anastrozole toxicities:    Breast cancer surveillance: Mammogram 08/15/2018: Biopsy-proven sites of DCIS and [facility]  are stable, breast density category C    Continue every 71-month mammograms and follow-up.     ? Osteoarthritis of knee 05/17/2017   ? H/O arthroscopy 03/06/2017   ? Myasthenia gravis with exacerbation (HCC/RAF) 12/18/2016   ? Vaginal atrophy 08/16/2016   ? Acute medial meniscal tear, left, subsequent encounter 07/18/2016   ? Renal cyst 03/12/2016   ? Asthmatic bronchitis, mild persistent, with acute exacerbation 12/29/2015     Last Assessment & Plan:   Continue rescue inhaler as needed. Try adding sample Bevespi maintenance inhaler to see if this helps.  She is currently on prednisone 20 mg daily maintenance and I don't think adding a steroid inhaler component will make any difference.     ? Urethral caruncle 07/26/2015   ? Chronic cholecystitis 01/14/2015   ? Angioedema 05/23/2014     Attributed to injected methylprednisolone    Last Assessment & Plan:   We discussed limited ability to test for possible triggers. We are asking our labs sources about ability to test against methylprednisolone for injection.  A different brand might be worth trying. She can try pre-medicating with an antihistamine.     ? Chronic interstitial cystitis 10/01/2011   ? Insomnia 05/31/2007     Qualifier: Diagnosis of   By: Maple Hudson MD, Clinton D    Last Assessment & Plan:   I'm concerned that some of her sleep problems may reflect obstructive sleep apnea based on her palate and which she can tell me about her sleep patterns without a witness. We also want to know about oxygenation during sleep. Plan-schedule sleep study  Qualifier: Diagnosis of   By: Maple Hudson MD, Clinton D      Last Assessment & Plan:   I'm concerned that some of her sleep problems may reflect obstructive sleep apnea based on her palate and which she can tell me about her sleep patterns without a witness. We also want to know about oxygenation during sleep.  Plan-schedule sleep study     ? COPD mixed type (HCC/RAF) 04/30/2007     Office Spirometry 03/16/2016-severe obstructive airways disease FVC 1.59/59%, FEV1 0.98/49%, ratio 0.62, FEF 25?75% 0.42/28%  PFT 04/16/16-severe obstructive airways disease with slight response to bronchodilator, severe diffusion defect. FEV1/FVC 0.64, DLCO 45%    Last Assessment & Plan:   Severe obstructive airways disease. She can't hear herself wheeze.  Plan-try samples of Trelegy instead of Anoro for comparison    Overview:   Office Spirometry 03/16/2016-severe obstructive airways disease FVC 1.59/59%, FEV1 0.98/49%, ratio 0.62, FEF 25?75% 0.42/28%  PFT 04/16/16-severe obstructive airways disease with slight response to bronchodilator, severe diffusion defect. FEV1/FVC 0.64, DLCO 45%    Last Assessment & Plan:   Severe obstructive airways disease. She can't hear herself wheeze.  Plan-try samples of Trelegy instead of Anoro for comparison  Office Spirometry 03/16/2016-severe obstructive airways disease FVC 1.59/59%, FEV1 0.98/49%, ratio 0.62, FEF 25?75% 0.42/28%  PFT 04/16/16-severe obstructive airways disease with slight response to bronchodilator, severe diffusion defect. FEV1/FVC 0.64, DLCO 45%    Last Assessment & Plan:   Severe COPD but mainly emphysema with limited bronchospasm component that could be addressed with bronchodilators.  Plan-try sample Bevespi as a maintenance controller, refill pro-air     ? Esophageal reflux 04/30/2007     History of fundoplication     Last Assessment & Plan:   Emphasized continued attention to antireflux measures. Reminded that reflux can be a significant trigger for irritable airways, cough and wheeze.  History of fundoplication  Last Assessment & Plan:   Emphasized continued attention to antireflux measures. Reminded that reflux can be a significant trigger for irritable airways, cough and wheeze.     ? 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 Management is mostly by her ENT physicians.     ? Fibromyalgia 04/30/2007     Qualifier: Diagnosis of   By: Yetta Barre CNA/MA, Rose Fillers: Diagnosis of   By: Yetta Barre CNA/MA, Shanda Bumps     ? Seasonal and perennial allergic rhinitis 04/30/2007     Qualifier: Diagnosis of   By: Yetta Barre CNA/MA, Shanda Bumps     Last Assessment & Plan:   She is now on prednisone 20 mg daily. I suggested she wait until her myasthenia status is stabilized. Then if she needs to she couldn't seek evaluation at one of the allergy practices in town as discussed, since our vaccine program will be closing.  Qualifier: Diagnosis of   By: Yetta Barre CNA/MA, Jessica       Last Assessment & Plan:   She is now on prednisone 20 mg daily. I suggested she wait until her myasthenia status is stabilized. Then if she needs to she couldn't seek evaluation at one of the allergy practices in town as discussed, since our vaccine program will be closing.         Objective:   BP 145/70 Comment: as per pt no sob and chest pain ~ Pulse 97  ~ Temp 36.6 ?C (97.9 ?F)     Physical Exam:  General: alert, appears stated age and cooperative  Eyes: conjunctiva and lids normal  ENMT: normal external nose and ears.  normal oropharynx  Lungs:normal respiratory effort.  no cyanosis.  Extremities:extremities normal, atraumatic, no cyanosis or edema  Skin: warm and dry, no rashes.  Psych: alert and oriented to person, place and time  MSK: tenderness of below muscle groups     Trigger/ Tender Points Identification:   No images are attached to the encounter. Assessment and Plan   Roena Sassaman is a 84 y.o. y.o. female who presents with   1. Chronic neck pain  Acupuncture    INJECT TRIGGER POINTS, > 3   2. Cervical myofascial strain, subsequent encounter  Acupuncture    INJECT TRIGGER POINTS, > 3   3. Myofascial pain  Acupuncture    INJECT TRIGGER POINTS, > 3     # Chronic neck pain  # Cervical myofascial pain  Temporarily improved with last treatment session. Patient with chronic neck pain for years, underlying myasthenia gravis, and MRI with multilevel degenerative changes and neural foraminal narrowing. Exam reveals multiple trigger points suggesting significant myofascial component to pain.   - repeat acupuncture today  - repeat trigger point injections today   - reinforced TENS unit use, patient bought unit, encouraged try to use at home and bring it in if any difficulty   - continue neck and heat shiatsu massage device   - gentle neck stretching, can reinforce next visit   ?  # Myasthenia gravis   - Qi tonic foods   - can try American ginseng tea    Treatments Provided Today:     Acupuncture point interventions:    LI-4/Lv-3, Sp-6/St-36 LI-10 Sp-10   K-3   GB-34   Heding     Acupuncture Treatment Duration:  20 Minutes  Injection of : Trigger Points with 0.35mL 1% lidocaine      Splenius cervicis, Trapezius Levator scapulae, Rhomboids, Infraspinatus                Trigger Point Injection Procedure Note   Procedure: Trigger point injection of Splenius cervicis, Trapezius Levator scapulae, Rhomboids, Infraspinatus              Indication: Myofascial pain; pain or altered sensation in the expected distribution of referred pain from a trigger point; taut band palpable in an accessible muscle with exquisite tenderness at one point along the length of it; some degree of restricted motion when measurable; reproduction of pain by stimulating trigger point  Consent: The risks (infection, bleeding, pain) and benefits of trigger points injections were explained to the patient and alternatives were discussed. All questions were answered to the patient's satisfaction. The patient verbalized understanding and consented to the trigger point injection(s).  Sterilization: The target area of the active trigger point(s) was sterilized using a 70% isopropyl alcohol pad.   Procedure: Manual palpation was used to identify the target area. Using a 1-inch 23 gauge and/or 0.5-inch 25 gauge needle, the following procedure was completed:   Injection of : Trigger Points with 0.8mL 1% lidocaine   into Splenius cervicis, Trapezius Levator scapulae, Rhomboids, Infraspinatus              The needle was infiltrated 1-3 cm in a fanwise method into each trigger point location. Manual pressure was applied after the procedure. There was no significant blood loss. The patient tolerated the procedure well.    Follow-up     Return in about 3 weeks (around 06/11/2019) for [place] or acupuncturist / K Hui.    Future Appointments   Date Time Provider Department Center   05/23/2019 11:45 AM Alvina Chou, AuD MP2 AUD AUDIOLOGY/SP   06/03/2019  2:20 PM York Ram., MD GERI IMS 420 MEDICINE   06/06/2019 12:30 PM Alvina Chou, AuD MP2 AUD AUDIOLOGY/SP   06/09/2019 12:40 PM MP2 MAM 02-RADIOLOGY MP2BR IMG [facility] Rad   06/12/2019  1:30 PM Trenda Moots., MD ESTWS [phone] MEDICINE   07/14/2019 10:00 AM NEU [facility] NEUMUSC B200 NEUROLOGY   05/03/2020  1:30 PM Shoku, Drue Flirt, OD DS VIS REHAB OPH       Seen and discussed with Dr. Karie Mainland    Author: Will Bonnet, MD, 05/21/2019, 5:27 PM   [facility] Fellow and Clinical Instructor

## 2019-05-23 ENCOUNTER — Ambulatory Visit: Payer: MEDICARE | Attending: Audiologist

## 2019-05-23 DIAGNOSIS — H903 Sensorineural hearing loss, bilateral: Secondary | ICD-10-CM

## 2019-05-23 NOTE — Patient Instructions
Accessing Audiology notes online:    The report is viewable in [facility] ''MyChart'' under clinical notes    1. Hover your mouse over the ''Visits'' icon.   2. Click on the ''Appointments and Visits'' link.   3. Click on the desired visit.   4. Click on the ''Clinical Notes'' tab located below the ''Appointment Details'' header.   5. Listed are the types of Ambulatory Notes you may see:   ? Progress Notes   ? Consult   ? Procedure   ? H&P   ? Goals of Care   ? Interdisciplinary Notes     **All of Audiology visit notes (Reports) are considered ''Consult Notes''.     Please call [phone] for additional help if you still cannot see notes.         A copy of the audiogram/reports can be obtained by contacting Medical Records:    Phone: [phone] [facility] Medical Records    In person during the hours of 8:00 AM-4:30 PM, Monday-Friday    [facility]- [address], [address] [place], [place] [address]    [facility]- [address], [place] [address]          Brayton Mars acceder a sus informes de Audiolog?a en l?nea:      El informe se puede ver en [facility] ''MyChart''. ?Se encuentra bajo notas cl?nicas.      Para acceder al informe:   1.  Pose el cursor sobre el ?icono de ?Visits.? (consultas)  2.  Presione el enlace de ?Appointments and Visits? (citas y consultas)  3.  Haga clic en la consulta deseada  4.  Haga clic en la pesta?a ''Clinical notes? (notas cl?nicas), situado debajo del rubro ''Appontment  Details'' (detalles de la cita)  5.  A continuaci?n, se enumeran los tipos de notas ambulatorias que podr? ver:   ? Progress Notes (notas evolutivas)  ? Consult (interconsulta)   ? Procedure (procedimiento)    ? H&P (historial m?dico y un examen f?sico)   ? Goals of Care (metas de tratamiento)   ? Interdisciplinary Notes (notas interdisciplinarias)    **Todas las notas de la consulta en Audiolog?a (Reports) se consideran ?Consult notes? (notas de interconsulta)     Por favor llame al [phone] para obtener ayuda adicional si todav?a no puede ver las notas.        Una copia del audiograma/informes se pueden obtener al comunicarse con el archivo de historia cl?nica     Llame por Tel?fono al: [phone], [facility] Medical Records (archivo de historia cl?nica en [place])     Atenci?n disponible en persona durante las horas entre 8:00 AM-4:30 PM, de lunes a viernes     [facility]- [address], [address] [place], [place] [address]     [facility]- [address], [address], [place] [address]

## 2019-06-02 DIAGNOSIS — N1832 Type 2 diabetes mellitus with stage 3b chronic kidney disease, without long-term current use of insulin (HCC/RAF): Secondary | ICD-10-CM

## 2019-06-02 DIAGNOSIS — E1121 Type 2 diabetes mellitus with diabetic nephropathy: Secondary | ICD-10-CM

## 2019-06-02 DIAGNOSIS — C50911 Malignant neoplasm of unspecified site of right female breast: Secondary | ICD-10-CM

## 2019-06-03 ENCOUNTER — Ambulatory Visit: Payer: BLUE CROSS/BLUE SHIELD | Attending: Geriatric Medicine

## 2019-06-03 DIAGNOSIS — J45909 Unspecified asthma, uncomplicated: Secondary | ICD-10-CM | POA: Diagnosis not present

## 2019-06-03 DIAGNOSIS — Z452 Encounter for adjustment and management of vascular access device: Secondary | ICD-10-CM | POA: Diagnosis not present

## 2019-06-03 DIAGNOSIS — G7000 Myasthenia gravis without (acute) exacerbation: Secondary | ICD-10-CM | POA: Diagnosis not present

## 2019-06-03 DIAGNOSIS — M797 Fibromyalgia: Secondary | ICD-10-CM | POA: Diagnosis not present

## 2019-06-03 DIAGNOSIS — J449 Chronic obstructive pulmonary disease, unspecified: Secondary | ICD-10-CM | POA: Diagnosis not present

## 2019-06-03 NOTE — Progress Notes
OUTPATIENT GERIATRICS CLINIC NOTE    PATIENT:  [name]   MRN:  [id number]  DOB:  September 09, 1935  DATE OF SERVICE:  06/03/2019  PRIMARY CARE PHYSICIAN: York Ram., MD    CHIEF COMPLAINT:   No chief complaint on file.      [facility]:  [employer]    [facility]:    Chaperone status:  No data recorded    HISTORY OF PRESENT ILLNESS     [name] is a 84 y.o. female who presents today for *follow up**. Patient is accompanied by *no one**. History today is per the patient,and review of available recent records in [facility] and Care Everywhere.     Patient is a(n) []  reliable []  unreliable historian and additional collateral information was obtained during the visit today from:     [place]--March 25--not ereviewd--  [facility]--audiology--May 23, 2019--note reviewed      Per chart review, pertinent medical history:  No past medical history on file.  No past surgical history on file.    ALLERGIES     Allergies   Allergen Reactions   ? Beta Adrenergic Blockers Other (See Comments)     Avoid d/t Myasthenia Gravis  Avoid d/t Myasthenia Gravis     ? Macrolides And Ketolides Other (See Comments)     Avoid d/t Myasthenia Gravis  Use with caution d/t Myasthenia Gravis  Use with caution d/t Myasthenia Gravis     ? Sulfa Antibiotics Other (See Comments)     May possibly have caused deafness in one ear  May possibly have caused deafness in one ear  other     ? Botulinum Toxins Other (See Comments)     Muscle weakness r/t Myasthenia Gravis  Muscle weakness r/t Myasthenia Gravis     ? Statins Other (See Comments)     Muscle aches  Muscle aches  Muscle aches     ? Plastic Tape Itching and Other (See Comments)     Turns the skin red where applied        MEDICATIONS     Personally reviewed.    No outpatient medications have been marked as taking for the 06/03/19 encounter (Appointment) with York Ram., MD.       SOCIAL HISTORY     Social History     Social History Narrative   ? Not on file FUNCTIONAL STATUS     BADLs:  IADLs:    Ambulates with   Falls in past year:  Afraid of falling:    GERIATRIC REVIEW OF SYSTEMS     Vision:    []   glasses     []  legally blind  Hearing: []  hearing aids    Nutrition: []  normal   []  impaired  []  vegan  []  vegetarian  []  low salt  []  low-carb   Swallowing: []  impaired   Dentures:   []  yes    Depression:  []  yes   Cognition:     Incontinence: [] urine  []  fecal  []  urine and fecal      ADVANCED CARE PLANNING     Advanced directives on file: []  No  []   Yes ? Completed:     Medical DPOA on file:   []   Yes ?   []   No ? Patient designates ** * to be their surrogate medical decision-maker.         HEALTH CARE MAINTENANCE     IMMUNIZATIONS:   Immunization History   Administered Date(s)  Administered   ? COVID-19, mRNA, LNP-S, PF, 30 mcg/0.3 mL Dose (Pfizer-BioNTech) 03/11/2019, 04/01/2019   ? DTaP 12/31/2015   ? influenza vaccine IM quadrivalent (Fluzone Quad) MDV (36 months of age and older) 11/26/2016   ? influenza vaccine IM quadrivalent adjuvanted (FluAD Quad) (PF) SYR (69 years of age and older) 11/05/2018   ? influenza, unspecified formulation 04/01/2019, 04/01/2019   ? pneumococcal polysaccharide vaccine 23-valent (Pneumovax) 01/10/2019       Mammogram:  DXA:  PAP:  Colonoscopy:      PHYSICAL EXAM     BP 144/77  ~ Pulse 95  ~ Temp 36.4 ?C (97.5 ?F) (Temporal)  ~ Resp 16  ~ Ht 5' 4'' (1.626 m)  ~ Wt 141 lb (64 kg)  ~ SpO2 98% Comment: room air ~ BMI 24.20 kg/m?   Wt Readings from Last 3 Encounters:   05/07/19 138 lb (62.6 kg)   04/14/19 144 lb (65.3 kg)   03/03/19 138 lb (62.6 kg)         System Check if normal Positive or additional negative findings   GEN  [x]  NAD     Eyes  []  Conj/Lids []  Pupils  []  Fundi   []  Sclerae []  EOM     ENT  []  External ears   []  Otoscopy   []  Gross Hearing    []   External nose   []  Nasal mucosa   []  Lips/teeth/gums    []  Oropharynx    []  Mucus membranes      Neck  []  Inspection/palpation    []  Thyroid     Resp  []  Effort    []  Auscultation CV  []  Rhythm/rate   []  Murmurs   []  Edema   []  JVP non-elevated    Normal pulses:   []  Radial []  Femoral  []  Pedal     Breast  []  Inspection []  Palpation     GI  []  Bowel sounds    []  Nontender   []  No distension    []  No rebound or guarding   []  No masses   []  Liver/spleen    []  Rectal     GU  F:  []  External []  vaginal wall         []  Cervix     []  mucus        []  Uterus    []  Adnexa   M:  []  Scrotum []  Penis         []  Prostate     Lymph  []  Cervical []  supraclavicular     []  Axillae   []  Groin/inguinal     MSK []  Gait  []  Back     Specify site examined:    []  Inspect/palp []  ROM   []  Stability []  Strength/tone []  Used arms to push up from seated to standing position   Assistive device:  []  single point cane []  quad cane  []  FWW []  rollator walker      Skin  []  Inspection []  Palpation     Neuro  []  Alert and oriented     []  CN2-12 intact grossly   []  DTR      []  Muscle strength      []  Sensation   []  Pronator drift   []  Finger to Nose/Heel to Shin   []  Romberg     Psych  []  Insight/judgement     []  Mood/affect    []  Gross cognition        Patient had brief  emotional/behavioral testing with standardized instruments as follows for the purpose of screening, case finding or evaluating treatment.  Results below:    GAD-7  PHQ-9 14    LABS/STUDIES     LABS:  Lab Results   Component Value Date    WBC 4.40 04/14/2019    WBC 8.75 12/17/2018    HGB 13.0 04/14/2019    HGB 12.2 12/17/2018    MCV 104.3 (H) 04/14/2019    PLT 314 04/14/2019    PLT 420 (H) 12/17/2018     Lab Results   Component Value Date    NA 140 04/14/2019    NA 141 12/17/2018    K 4.5 04/14/2019    K 4.1 12/17/2018    CREAT 1.05 04/14/2019    CREAT 1.13 12/17/2018    GFRESTNOAA 50 04/14/2019    GFRESTNOAA 46 12/17/2018    GFRESTAA 58 04/14/2019    GFRESTAA 53 12/17/2018    CALCIUM 9.4 04/14/2019    CALCIUM 9.8 12/17/2018     Lab Results   Component Value Date    ALT 14 04/14/2019    ALT 11 12/17/2018    AST 21 04/14/2019    AST 20 12/17/2018    ALKPHOS 91 04/14/2019    BILITOT 0.2 04/14/2019    ALBUMIN 3.9 04/14/2019    ALBUMIN 4.3 12/17/2018     Lab Results   Component Value Date    TSH 1.00 12/17/2018     No results found for: HGBA1C  No results found for: CHOL, CHOLDLCAL  No results found for: CHOLDLQ  No results found for: VITD25OH  No results found for: FE, FERRITIN, FOLATE, RETICCNT, TIBC  No results found for: VITAMINB12, RPR  No results found for: BNP  No results found for: PSATOTAL      STUDIES:      ASSESSMENT and PLAN     Chessica Gillean is a 84 y.o. female who presents today for *follow up**.        #Macrocytosis--check B12, folic acid at some point  #Deconditioning--initiate home PT  #GERD--s/p Nisan fundoplication  #COPD--mixed--per PFT's from [name].--consider reinitiation of inhalers.  #s/p rectocele repair  #s/p cholecystectomy  #Nocturnal hypoxemia--presumably related to MG crisis but has history of COPD. ?Will order nocturnal home O2 study and consider PFT's/Pulmonary evaluation once settled down. ?May need to restart BREO inhaler.  #Myasthenia Gravis--s/p plasmapheresis x5 October 2020, no Thymoma, on Azathioprine 150 daily and mestinon 60 tid.??Saw?Dr. Enedina Finner at [facility]. ?Was considering Soliris.??Ordering IVIG and MRI  #IBS--titrate miralax to comfort. ?Consider duloxetine substitute for buproprion.  #Interstitial cystitis--on daily Trimethoprim 100 for UTI prophylaxis. ?Using bladder instillations prn. OFF topical estrogen due to advice from neurologist re: MG.  To see Dr. Desma Maxim to see if she has additional insights.  #Major Depression--active. ?Likely to add or substitute duloxetine  #Allergic rhinitis--previously on nasal steroids.  #Breast CA--DCIS?by report--obtain mammo. ?Patient declined surgery in 2019 and experienced dry mouth from Tamoxifen. ?Was considering Anastrozole. Await records from prior mammogram and treatment.  Per records from [facility]:   ''07/20/2017 Screening detected right breast calcifications and distortion. The distortion was in the upper outer quadrant: Biopsy DCIS intermediate grade with CSL; calcifications UIQ: 2.8 cm: Biopsy DCIS+ ALH +CSL; 4 cm apart, axilla negative, Tis NX stage 0''  #DM with CKD--stabe 3b--based on labs from [name]. ?FOllow here. ?If BP permits, may consider ARB BUT patient with h/o angioedema so will ONLY initiate this agent with great caution.  Refer to Ophthalmology.  #  OA knees--previously getting hyaluronic acid injections.  #Anxiety  #Benzodiazepine dependence--will work to wean over time.  #Fibromyalgia--duloxetine as above. ?Trial PT  #deafness in one ear--will schedule Audiology.  #Polypharmacy  #Statin intolerance      FOLLOW-UP     RTC     Future Appointments   Date Time Provider Department Center   06/03/2019  9:00 AM York Ram., MD GERI IMS 420 MEDICINE   06/06/2019 12:30 PM Alvina Chou, AuD MP2 AUD AUDIOLOGY/SP   06/09/2019 12:40 PM MP2 MAM 02-RADIOLOGY MP2BR [facility] [place] [facility]   06/12/2019  1:30 PM Trenda Moots., MD ESTWS [phone] MEDICINE   07/14/2019 10:00 AM [facility] NEUMUSC B200 NEUROLOGY   05/03/2020  1:30 PM Shoku, Bita, OD DS VIS REHAB OPH         The above plan of care, diagnosis, orders, and follow-up were discussed with the patient and/or surrogate. Questions related to this recommended plan of care were answered.    ANALYSIS OF DATA (Needs to meet 1 category for moderate and 2 categories for high LOS)     I have:     Category 1 (Needs 3 for moderate and high LOS)     []  Reviewed []  1 []  2 []  ? 3 unique laboratory, radiology, and/or diagnostic tests noted below    Test/Study:  on date .    []  Reviewed []  1 []  2 []  ? 3 prior external notes and incorporated into patient assessment    I reviewed Dr. 's note in specialty  from date .    []  Discussed management or test interpretation with external provider(s) as noted      []  Ordered []  1 []  2 []  ? 3 unique laboratory, radiology, and/or diagnostic tests noted in A&P    []  Obtained history from independent historian:       Category 2  []  Independently interpreted the test    Category 3  []  Discussed management or test interpretation with external provider(s) as noted      INTERACTION COMPLEXITY and SOCIAL DETERMINANTS of HEALTH       PROBLEM COMPLEXITY   []  New problem with uncertain diagnosis or prognosis (moderate)   []  Multiple stable chronic problems (moderate)   []  Chronic problem not stable - not controlled, symptomatic, or worsening (moderate)   []  Severe exacerbation of chronic problem (high)   []  New or chronic problem that poses threat to life or bodily function (high)     MANAGEMENT COMPLEXITY   []  Old or external/outside records reviewed   []  Discussion with alternate (proxy) if patient with impaired communication / comprehension ability (e.g., dementia, aphasia, severe hearing loss).   []  Repeated questions (or disagreement) between patient and/among caregivers/family during the visit.  []  Caregiver/patient emotions/behavior/beliefs interfering with implementation of treatment plan.  []  Independent interpretation of test (EKG, Chest XRay)   []  Discussion of case with a consultant physician     RISK LEVEL   []  Prescription drug management (moderate)   []  Minor surgery with CV risk factors or elective major surgery (moderate)   []  Dx or Rx significantly limited by SDoH (inadequate housing, living alone, poor health care access, inappropriate diet; low literacy) (moderate)   []  Major surgery - elective with CV risk factors or emergent (high)   []  Need for hospitalization (high)   []  New DNR or de-escalation of care (high)      SDoH  The diagnosis or treatment of said conditions is significantly limited  by the following social determinants of health:  []  Z59.0 Homelessness  []  Z59.1 Inadequate housing  []  Z59.2 Discord with neighbors, lodgers and landlord  []  Z60.2 Problems related to living alone  []  Z59.8 Other problems related to housing and economic circumstances  []  Z59.4 Lack of adequate food and safe drinking water  []  Z59.6 Low income  []  Z59.7 Insufficient social insurance and welfare support  []  Z59.9 Problems related to housing and economic circumstances, unspecified  []  Z75.3 Unavailability and inaccessibility of health care facilities  []  Z75.4 Unavailability and inaccessibility of other helping agencies  []  Z72.4 Inappropriate diet and eating habits  []  Z62.820 Parent-biological child conflict  []  Z63.8 Other specified problems related to primary support group  []  Z55.0 Illiteracy and low level literacy   []  Z56.9 Unspecified problems related to employment        If Billing Based on Time:     I performed the following items on the day of service:    [x]  Preparing to see the patient (e.g., review of tests)  [x]  Obtaining and/or reviewing separately obtained history   [x]  Performing a medically appropriate examination and/or evaluation   [x]  Counseling and educating the patient/family/caregiver   [x]  Ordering medications, tests, or procedures  [x]  Referring and communicating with other healthcare professionals (when not separately reported)  [x]  Documenting clinical information in the EHR  []  Independently interpreting results and communicating results to patient/family/caregiver    I spent the following total amount of time on these tasks on the day of service:  New Patient     Established Patient  []  15-29 minutes ? 45409    []  up to 9 minutes ? 99211  []  30-44 minutes ? 81191     []  10-19 minutes ? 99212   []  45-59 minutes ? 47829      []  20-29 minutes ? 99213   []  60-74 minutes ? 56213   []  30-39 minutes ? 08657         [x]  40-55 minutes ? 84696    []  I spent an additional ** * 15-minute-increment(s) for a total of * ** minutes on these tasks on the day of service. [phone] for each additional 15 minutes.)    Author: York Ram, MD 06/03/2019

## 2019-06-04 NOTE — Consults
Hearing Aid Check    Patient History: Josselyne Onofrio has a known bilateral sensorineural hearing loss; left ear poorer. Lonia Roane was fitted with a BiCROS device 05/23/2019.  Brieana Shimmin was seen today for on-going management of hearing aid(s).    Hearing Aid(s)  Manufacturer: Phonak  Right Ear Model: Audeo B70   Left Ear Model: CROS-B   Right SN: [id number]  Left SN:  [id number]  Speaker Length & Power: 2S  Right Ear Dome: Large closed  Left Ear Dome: Medium open  Battery Size: 13  Initial HAF: 05/23/2019  Right Hearing Aid Warranty Expiration: 08/09/2022  Left CROS Warranty Expiration: 08/08/2021  Right to Return Expiration Date: 07/06/2019    Patient reports:   ? No issues with the hearing aids. Has not needed to adjust the volume and finds it comfortable.   ? Having difficulty knowing which battery is dead when the signal goes off.   ? Significant difficulty hearing in the dining room of her new residence and still cannot tell where sounds are in space.   ? Cannot always hear her doorbell/knocking.     Actions taken:  ? Datalogging: 10 hrs/day  ? Programming adjustments:  ? Autosense program:   ? Speech in noise: increased Noise Block to 12   ? Comfort in noise: increased from 4-6 (more fixed microphone directionality)   ? Additional program: Speech in Noise   ? Increased 2000-8000 Hz 3 clicks   ? Decreased 250-500 Hz 3 clicks   ? Decreased [phone] Hz 1 click   ? Practiced program change in office. Patient independently performed task.   ? Activated alerts/program change for both CROS and hearing aid side. Verified low battery warning for left (1 beep) vs. Right (2 beeps) to be different.  ? Hearing aids cleaned and checked. Listening check revealed good sound quality.   ? Reviewed warranty information for CROS side. Patient signed and was provided a copy of new agreement with updated warranty.     Counseling:   ? Patient was counseled on limitations to localization as she is only hearing with one ear. Additionally, given the environmental factors of the dining room (multiple speakers, background noise, reverberation), understanding in noise may remain difficult. Patient was counseled to try the new program and see if there is any benefit.   ? Assistive listening devices (i.e. Environmental manager) was explained, however it was recommended patient continue with the CROS through the trial period before deciding on final/additional technology. Patient was informed that the current level of technology, in the B-hearing aid, does not allow direct streaming of accessories. However, if she finds minimal benefit from the CROS device then the newer Myles Lipps should be trialed as previously discussed due to the ability to stream directly. Patient understood and agreed. Patient stated she will try wearing just the right hearing aid to see if she is benefiting from the CROS also.     Follow-up Recommendations:  Regular daily use of hearing aids was encouraged.  Patient should return to [facility] in 2-3 weeks, before May 10, 20201, for hearing aid check and final decision on devices.     Alvina Chou, AuD, 06/06/2019   Audiologist     Education:  Topic: Hearing aids  Person Taught: patient    Barrier: None    Needs: Knows well    Teaching Method: Demonstration, Teacher, English as a foreign language and Verbal instruction    Outcome: Able to repeat information and/or demonstrate what was taught

## 2019-06-06 ENCOUNTER — Ambulatory Visit: Payer: BLUE CROSS/BLUE SHIELD | Attending: Audiologist

## 2019-06-06 DIAGNOSIS — H903 Sensorineural hearing loss, bilateral: Secondary | ICD-10-CM

## 2019-06-06 NOTE — Patient Instructions
Accessing Audiology notes online:    The report is viewable in [facility] ''MyChart'' under clinical notes    1. Hover your mouse over the ''Visits'' icon.   2. Click on the ''Appointments and Visits'' link.   3. Click on the desired visit.   4. Click on the ''Clinical Notes'' tab located below the ''Appointment Details'' header.   5. Listed are the types of Ambulatory Notes you may see:   ? Progress Notes   ? Consult   ? Procedure   ? H&P   ? Goals of Care   ? Interdisciplinary Notes     **All of Audiology visit notes (Reports) are considered ''Consult Notes''.     Please call [phone] for additional help if you still cannot see notes.         A copy of the audiogram/reports can be obtained by contacting Medical Records:    Phone: [phone] [facility] Medical Records    In person during the hours of 8:00 AM-4:30 PM, Monday-Friday    [facility]- [address], [address] [place], [place] [address]    [facility]- [address], [place] [address]          Brayton Mars acceder a sus informes de Audiolog?a en l?nea:      El informe se puede ver en [facility] ''MyChart''. ?Se encuentra bajo notas cl?nicas.      Para acceder al informe:   1.  Pose el cursor sobre el ?icono de ?Visits.? (consultas)  2.  Presione el enlace de ?Appointments and Visits? (citas y consultas)  3.  Haga clic en la consulta deseada  4.  Haga clic en la pesta?a ''Clinical notes? (notas cl?nicas), situado debajo del rubro ''Appontment  Details'' (detalles de la cita)  5.  A continuaci?n, se enumeran los tipos de notas ambulatorias que podr? ver:   ? Progress Notes (notas evolutivas)  ? Consult (interconsulta)   ? Procedure (procedimiento)    ? H&P (historial m?dico y un examen f?sico)   ? Goals of Care (metas de tratamiento)   ? Interdisciplinary Notes (notas interdisciplinarias)    **Todas las notas de la consulta en Audiolog?a (Reports) se consideran ?Consult notes? (notas de interconsulta)     Por favor llame al [phone] para obtener ayuda adicional si todav?a no puede ver las notas.        Una copia del audiograma/informes se pueden obtener al comunicarse con el archivo de historia cl?nica     Llame por Tel?fono al: [phone], [facility] Medical Records (archivo de historia cl?nica en [place])     Atenci?n disponible en persona durante las horas entre 8:00 AM-4:30 PM, de lunes a viernes     [facility]- [address], [address] [place], [place] [address]     [facility]- [address], [address], [place] [address]

## 2019-06-09 ENCOUNTER — Inpatient Hospital Stay: Payer: MEDICARE

## 2019-06-09 ENCOUNTER — Inpatient Hospital Stay: Payer: BLUE CROSS/BLUE SHIELD

## 2019-06-09 DIAGNOSIS — R6889 Other general symptoms and signs: Secondary | ICD-10-CM

## 2019-06-09 DIAGNOSIS — Z1231 Encounter for screening mammogram for malignant neoplasm of breast: Secondary | ICD-10-CM | POA: Diagnosis not present

## 2019-06-11 ENCOUNTER — Ambulatory Visit: Payer: MEDICARE

## 2019-06-11 DIAGNOSIS — G8929 Other chronic pain: Secondary | ICD-10-CM

## 2019-06-11 DIAGNOSIS — M7918 Myalgia, other site: Secondary | ICD-10-CM

## 2019-06-11 DIAGNOSIS — S161XXS Strain of muscle, fascia and tendon at neck level, sequela: Secondary | ICD-10-CM

## 2019-06-11 DIAGNOSIS — G7000 Myasthenia gravis without (acute) exacerbation: Secondary | ICD-10-CM

## 2019-06-11 DIAGNOSIS — M542 Cervicalgia: Secondary | ICD-10-CM

## 2019-06-11 DIAGNOSIS — Z1231 Encounter for screening mammogram for malignant neoplasm of breast: Secondary | ICD-10-CM

## 2019-06-11 DIAGNOSIS — N3010 Interstitial cystitis (chronic) without hematuria: Secondary | ICD-10-CM

## 2019-06-11 DIAGNOSIS — M810 Age-related osteoporosis without current pathological fracture: Secondary | ICD-10-CM

## 2019-06-11 NOTE — Progress Notes
[facility] Progress Note     PATIENT: [name]  MRN: [id number]  DOB: 1935-07-20  DATE OF SERVICE: 06/12/2019  REFERRING PRACTITIONER: No ref. provider found  PRIMARY CARE PROVIDER: York Ram., MD    Chief Complaint   Patient presents with   ? Neck Pain     Subjective:      History of Present Illness:  [name] is a 84 y.o. y.o. female with PMH of  has no past medical history on file. who presents for follow-up cervical and lumbar spondylosis.     Follow-up 06/12/2019   - treatments help temporarily with the neck pain  - using shiatsu neck massager  - has TENS unit, wondering where to place     Follow-up 05/21/19  - feels the treatments last visit did help reduce her pain temporarily   - still with persistent neck pain and left knee weakness  - got neck and shoulder massager and using it at home  - purchased TENS unit but forgot to bring it today  - [place]    Initial Consult 04/16/19  # Chronic neck and shoulder pain  - present for many years, gradual onset, no inciting event or trauma. Underlying myasthenia gravis.   - Prior work-up: MR cervical spine with multilevel degenerative disc disease in setting of C4-C7 congenital cervical spinal canal stenosis, neural foraminal narrowing, facet joint degeneration   - Interventions: no medications for pain; was going to start PT but didn't go due to pandemic  - Self-care: cervical collar sometimes if needed to bend down  - Timing/Duration: constant, associated with activities   - Location/Radiation: bilateral neck, no radiation  - Quality: aching, sharp at times  - Severity: varies between 5 to 10 out of 10  - Modifiers: worse with activity. better with rest.   - Associated s/s: diffuse generalized weakness of bilateral lower extremities (thinks its related to myasthenia gravis), leg pain  - Denies: numbness, tingling, upper extremity weakness, changes in bowel or bladder, changes in gait  ?  # Myasthenia gravis  - hospitalized 11/2018 for exacerbation, receiving IVIG infusions regularly  - uses a walker / cane sometimes  ?  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 Husband died 2 years ago of CHF.   BM: intermittent constipation, diarrhea   Temperature: tends to run cold  Supplements: none    Review of Systems:  A 14 point review of systems was completed on intake and fully reviewed with patient.  Pertinent areas discussed and documented in HPI or noted below.       Medications that the patient states to be currently taking   Medication Sig   ? Albuterol Sulfate AEPB Inhale Takes 1 to 2 puffs every 4 hours as needed for wheezing or SOB .   ? azaTHIOprine 50 mg tablet Take 1.5 tablets (75 mg total) by mouth two (2) times daily.   ? buPROPion, SR, (WELLBUTRIN SR) 200 mg 12 hr tablet Take 1 tablet (200 mg total) by mouth two (2) times daily.   ? clonazePAM 1 mg disintegrating tablet Take 1 tablet (1 mg total) by mouth at bedtime. Max Daily Amount: 1 mg   ? dexlansoprazole 60 mg  DR capsule Take 1 capsule (60 mg total) by mouth daily.   ? GAMMAGARD 30 GM/300ML SOLN every twenty one (21) days .   ? lidocaine 5% patch Place 1 patch onto the skin every twelve (12) hours 12 hours on 12 hours off..   ? lidocaine-prilocaine 2.5-2.5% cream two (2) times daily as needed .   ? montelukast 10 mg tablet Take 10 mg by mouth as needed for.   ? nitrofurantoin 100 mg capsule as needed for .   ? phenazopyridine 200 mg tablet Take 1 tablet (200 mg total) by mouth three (3) times daily with meals.   ? trimethoprim 100 mg tablet Take 1 tablet (100 mg total) by mouth daily.   ? [DISCONTINUED] calcium carbonate 500 mg chewable tablet Chew 1 tablet by mouth daily Calcium Carbonate 500 mg is equivalent to 200 mg elemental Calcium .   ? [DISCONTINUED] clonazePAM 1 mg tablet Take 1 tablet (1 mg total) by mouth three (3) times daily. Max Daily Amount: 3 mg     Allergies   Allergen Reactions   ? Beta Adrenergic Blockers Other (See Comments)     Avoid d/t Myasthenia Gravis  Avoid d/t Myasthenia Gravis     ? Macrolides And Ketolides Other (See Comments)     Avoid d/t Myasthenia Gravis  Use with caution d/t Myasthenia Gravis  Use with caution d/t Myasthenia Gravis     ? Sulfa Antibiotics Other (See Comments)     May possibly have caused deafness in one ear  May possibly have caused deafness in one ear  other     ? Botulinum Toxins Other (See Comments)     Muscle weakness r/t Myasthenia Gravis  Muscle weakness r/t Myasthenia Gravis     ? Statins Other (See Comments)     Muscle aches  Muscle aches  Muscle aches     ? Plastic Tape Itching and Other (See Comments)     Turns the skin red where applied     Patient Active Problem List    Diagnosis Date Noted   ? Refractive error 04/28/2019   ? Ptosis of eyelid 04/28/2019   ? Pseudophakia of both eyes 04/28/2019   ? Benzodiazepine dependence (HCC/RAF) 12/20/2018   ? Breast cancer (HCC/RAF) 12/20/2018   ? Spondylosis without myelopathy or radiculopathy, lumbar region 11/28/2018   ? Anxiety 11/27/2018   ? CKD (chronic kidney disease), stage III 11/27/2018   ? Nocturnal hypoxemia 04/07/2018     Last Assessment & Plan:   We discussed potential impact of hypoxemia from her COPD on other body function  Plan-overnight oximetry     ? Major depression, recurrent, chronic (HCC/RAF) 12/16/2017   ? Diabetes mellitus (HCC/RAF) 12/16/2017   ? Carcinoma in situ of breast 07/24/2017     Last Assessment & Plan:   07/19/2017?Screening detected right breast calcifications and distortion. ?The distortion was in the upper outer quadrant: Biopsy DCIS intermediate grade with CSL; calcifications UIQ: 2.8 cm: Biopsy DCIS+ ALH +CSL; 4 cm apart, axilla negative, Tis NX stage 0  ?  Significant delay in decision making because of psychosocial issues and myasthenia gravis.  Patient refused surgery and went on Tamoxifen 10 mg daily for 1 month and discontinued it.  Started anastrozole 01/30/1999 19/2 a tablet daily    Anastrozole toxicities:    Breast cancer surveillance: Mammogram 08/15/2018: Biopsy-proven sites of DCIS and ALH are stable, breast density category C    Continue every 54-month mammograms and follow-up.     ?  Osteoarthritis of knee 05/17/2017   ? H/O arthroscopy 03/06/2017   ? Myasthenia gravis with exacerbation (HCC/RAF) 12/18/2016   ? Vaginal atrophy 08/16/2016   ? Acute medial meniscal tear, left, subsequent encounter 07/18/2016   ? Renal cyst 03/12/2016   ? Asthmatic bronchitis, mild persistent, with acute exacerbation 12/29/2015     Last Assessment & Plan:   Continue rescue inhaler as needed. Try adding sample Bevespi maintenance inhaler to see if this helps.  She is currently on prednisone 20 mg daily maintenance and I don't think adding a steroid inhaler component will make any difference.     ? Urethral caruncle 07/26/2015   ? Chronic cholecystitis 01/14/2015   ? Angioedema 05/23/2014     Attributed to injected methylprednisolone    Last Assessment & Plan:   We discussed limited ability to test for possible triggers. We are asking our labs sources about ability to test against methylprednisolone for injection.  A different brand might be worth trying. She can try pre-medicating with an antihistamine.     ? Chronic interstitial cystitis 10/01/2011   ? Insomnia 05/31/2007     Qualifier: Diagnosis of   By: Maple Hudson MD, Clinton D    Last Assessment & Plan:   I'm concerned that some of her sleep problems may reflect obstructive sleep apnea based on her palate and which she can tell me about her sleep patterns without a witness. We also want to know about oxygenation during sleep.  Plan-schedule sleep study  Qualifier: Diagnosis of   By: Maple Hudson MD, Clinton D      Last Assessment & Plan:   I'm concerned that some of her sleep problems may reflect obstructive sleep apnea based on her palate and which she can tell me about her sleep patterns without a witness. We also want to know about oxygenation during sleep.  Plan-schedule sleep study     ? COPD mixed type (HCC/RAF) 04/30/2007     Office Spirometry 03/16/2016-severe obstructive airways disease FVC 1.59/59%, FEV1 0.98/49%, ratio 0.62, FEF 25?75% 0.42/28%  PFT 04/16/16-severe obstructive airways disease with slight response to bronchodilator, severe diffusion defect. FEV1/FVC 0.64, DLCO 45%    Last Assessment & Plan:   Severe obstructive airways disease. She can't hear herself wheeze.  Plan-try samples of Trelegy instead of Anoro for comparison    Overview:   Office Spirometry 03/16/2016-severe obstructive airways disease FVC 1.59/59%, FEV1 0.98/49%, ratio 0.62, FEF 25?75% 0.42/28%  PFT 04/16/16-severe obstructive airways disease with slight response to bronchodilator, severe diffusion defect. FEV1/FVC 0.64, DLCO 45%    Last Assessment & Plan:   Severe obstructive airways disease. She can't hear herself wheeze.  Plan-try samples of Trelegy instead of Anoro for comparison  Office Spirometry 03/16/2016-severe obstructive airways disease FVC 1.59/59%, FEV1 0.98/49%, ratio 0.62, FEF 25?75% 0.42/28%  PFT 04/16/16-severe obstructive airways disease with slight response to bronchodilator, severe diffusion defect. FEV1/FVC 0.64, DLCO 45%    Last Assessment & Plan:   Severe COPD but mainly emphysema with limited bronchospasm component that could be addressed with bronchodilators.  Plan-try sample Bevespi as a maintenance controller, refill pro-air     ? Esophageal reflux 04/30/2007     History of fundoplication     Last Assessment & Plan:   Emphasized continued attention to antireflux measures. Reminded that reflux can be a significant trigger for irritable airways, cough and wheeze.  History of fundoplication       Last Assessment & Plan: Emphasized continued attention to antireflux measures. Reminded that reflux can  be a significant trigger for irritable airways, cough and wheeze.     ? 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 Management is mostly by her ENT physicians.     ? Fibromyalgia 04/30/2007     Qualifier: Diagnosis of   By: Yetta Barre CNA/MA, Rose Fillers: Diagnosis of   By: Yetta Barre CNA/MA, Shanda Bumps     ? Seasonal and perennial allergic rhinitis 04/30/2007     Qualifier: Diagnosis of   By: Yetta Barre CNA/MA, Shanda Bumps     Last Assessment & Plan:   She is now on prednisone 20 mg daily. I suggested she wait until her myasthenia status is stabilized. Then if she needs to she couldn't seek evaluation at one of the allergy practices in town as discussed, since our vaccine program will be closing.  Qualifier: Diagnosis of   By: Yetta Barre CNA/MA, Jessica       Last Assessment & Plan:   She is now on prednisone 20 mg daily. I suggested she wait until her myasthenia status is stabilized. Then if she needs to she couldn't seek evaluation at one of the allergy practices in town as discussed, since our vaccine program will be closing.         Objective:   BP 157/81  ~ Pulse 89  ~ Temp 36.6 ?C (97.9 ?F) (Forehead)     Physical Exam:  General: alert, appears stated age and cooperative  Eyes: conjunctiva and lids normal  ENMT: normal external nose and ears.  normal oropharynx  Lungs:normal respiratory effort.  no cyanosis.  Extremities:extremities normal, atraumatic, no cyanosis or edema  Skin: warm and dry, no rashes.  Psych: alert and oriented to person, place and time  MSK: tenderness of below muscle groups     Trigger/ Tender Points Identification:   No images are attached to the encounter.         Assessment and Plan   [name] is a 84 y.o. y.o. female who presents with   1. Chronic neck pain Acupuncture   2. Cervical myofascial strain, sequela  Acupuncture    INJECT TRIGGER POINTS, > 3    CANCELED: Inject TendonSheath/Ligament BP   3. Myofascial pain  Acupuncture    INJECT TRIGGER POINTS, > 3    CANCELED: Inject TendonSheath/Ligament BP   4. Myasthenia gravis without (acute) exacerbation (HCC/RAF)     5. TMJ syndrome  INJECT TRIGGER POINTS, > 3     # Chronic neck pain  # Cervical myofascial pain  Temporarily improved with last treatment session. Patient with chronic neck pain for years, underlying myasthenia gravis, and MRI with multilevel degenerative changes and neural foraminal narrowing. Exam reveals multiple trigger points suggesting significant myofascial component to pain.   - repeat acupuncture today  - repeat trigger point injections today   - reinforced TENS unit use, demonstrated to place pads on upper neck and trapezius    - continue neck and heat shiatsu massage device   - gentle neck stretching, can reinforce next visit   ?  # Myasthenia gravis   - Qi tonic foods   - can try American ginseng tea    # TMJ symptoms  - trigger points done    Treatments Provided Today:     Acupuncture point interventions:    LI-4/Lv-3, Sp-6/St-36 LI-10 Sp-10  K-3   GB-34         Acupuncture Treatment Duration: 20 Minutes  Injection of : Patient Tolerated Procedure Well, Trigger Points with 0.72mL 2% lidocaine      Trapezius(L splenius, SCM, TMJ) Rhomboids, Infraspinatus, Levator scapulae                Trigger Point Injection Procedure Note   Procedure: Trigger point injection of Trapezius(L splenius, SCM, TMJ) Rhomboids, Infraspinatus, Levator scapulae              Indication: Myofascial pain; pain or altered sensation in the expected distribution of referred pain from a trigger point; taut band palpable in an accessible muscle with exquisite tenderness at one point along the length of it; some degree of restricted motion when measurable; reproduction of pain by stimulating trigger point  Consent: The risks (infection, bleeding, pain) and benefits of trigger points injections were explained to the patient and alternatives were discussed. All questions were answered to the patient's satisfaction. The patient verbalized understanding and consented to the trigger point injection(s).  Sterilization: The target area of the active trigger point(s) was sterilized using a 70% isopropyl alcohol pad.   Procedure: Manual palpation was used to identify the target area. Using a 1-inch 23 gauge and/or 0.5-inch 25 gauge needle, the following procedure was completed:   Injection of : Patient Tolerated Procedure Well, Trigger Points with 0.79mL 2% lidocaine   into Trapezius(L splenius, SCM, TMJ) Rhomboids, Infraspinatus, Levator scapulae              The needle was infiltrated 1-3 cm in a fanwise method into each trigger point location. Manual pressure was applied after the procedure. There was no significant blood loss. The patient tolerated the procedure well.    Follow-up     Return in about 3 weeks (around 07/03/2019) for Blake Divine / K Hui.    Future Appointments   Date Time Provider Department Center   07/03/2019  3:00 PM Trenda Moots., MD [facility] [id number] MEDICINE   07/04/2019 11:45 AM Alvina Chou, AuD MP2 AUD AUDIOLOGY/SP   07/14/2019 10:00 AM [facility] NEUMUSC B200 NEUROLOGY   07/17/2019  1:30 PM Trenda Moots., MD [facility] [id number] MEDICINE   07/29/2019 11:20 AM York Ram., MD GERI IMS 420 MEDICINE   05/03/2020  1:30 PM Sherilyn Banker, OD DS VIS REHAB OPH       Seen and discussed with Dr. Kathalene Frames    Author: Will Bonnet, MD, 06/21/2019, 7:46 AM   [facility] Fellow and Clinical Instructor    Patient was seen and examined with the fellow Dr. Will Bonnet. I agree with the findings/assessment/plan we formulated together as documented above.    Kathalene Frames MD MPH  [facility]  [facility]  Pgr [phone]  06/21/2019 7:46 AM

## 2019-06-12 ENCOUNTER — Ambulatory Visit: Payer: BLUE CROSS/BLUE SHIELD

## 2019-06-12 DIAGNOSIS — M26629 Arthralgia of temporomandibular joint, unspecified side: Secondary | ICD-10-CM

## 2019-06-12 MED ORDER — CLONAZEPAM 1 MG PO TBDP
1 mg | ORAL_TABLET | Freq: Every evening | ORAL | 1 refills | Status: AC
Start: 2019-06-12 — End: ?

## 2019-06-17 ENCOUNTER — Ambulatory Visit: Payer: BLUE CROSS/BLUE SHIELD

## 2019-06-17 MED ORDER — CLONAZEPAM 1 MG PO TABS
1 mg | ORAL_TABLET | Freq: Three times a day (TID) | ORAL | 1 refills
Start: 2019-06-17 — End: ?

## 2019-06-18 ENCOUNTER — Ambulatory Visit: Payer: BLUE CROSS/BLUE SHIELD

## 2019-06-18 MED ORDER — CLONAZEPAM 1 MG PO TABS
1 mg | ORAL_TABLET | Freq: Three times a day (TID) | ORAL | 1 refills | Status: AC
Start: 2019-06-18 — End: ?

## 2019-06-19 ENCOUNTER — Ambulatory Visit: Payer: BLUE CROSS/BLUE SHIELD | Attending: Geriatric Medicine

## 2019-06-19 ENCOUNTER — Inpatient Hospital Stay: Payer: MEDICARE | Attending: Geriatric Medicine

## 2019-06-19 DIAGNOSIS — M810 Age-related osteoporosis without current pathological fracture: Secondary | ICD-10-CM

## 2019-06-19 DIAGNOSIS — N3010 Interstitial cystitis (chronic) without hematuria: Secondary | ICD-10-CM

## 2019-06-30 NOTE — Consults
Hearing Aid Check    Patient History: [name] has a known bilateral sensorineural hearing loss; left ear poorer. [name] was fitted with a BiCROS device 05/23/2019.  [name] was seen today for on-going management of hearing aid(s).  ???  Hearing Aid(s)  Manufacturer:???Phonak  Right Ear???Model:???Audeo B70   Left Ear Model: CROS-B???  Right SN:???[id number]  Left SN:??????[id number]  Speaker Length & Power:???2S  Right Ear???Dome: Large closed  Left Ear Dome: Medium open  Battery Size:???13  Initial HAF:???05/23/2019  Right???Hearing Aid Warranty Expiration:???08/09/2022  Left CROS???Warranty Expiration:???08/08/2021  Right to Return Expiration Date:???07/06/2019    Patient reports:   ??? Still enjoying using her hearing aids, but doesn't notice a significant benefit from the CROS and will often only wear the right hearing aid when in a rush to leave or go out.   ??? Still having significant difficulties hearing in the dinning room or while in a group of people.     Actions taken:  ??? Discussed the option of returning the BiCROS system and obtaining the newest Phonak model hearing aid for the right ear only given limited benefit from the CROS. Patient was informed of connectivity features with the new hearing aid option for rechargeable batteries.   ??? Patient expressed interest in pursing the Phonak Audeo P70 hearing aid with disposable batteries in the same color (H0 Beige).   ??? Patient was quoted $1800.00.   ??? Patient returned hearing aids and a loaner right Deno Etienne B90-13 (SN: [id number]) was programmed and provided for use until new hearing aid is received.   ??? Patient reported clearer sound quality with loaner aid as compared to CROS.  ??? Patient was counseled on communication strategies and realistic expectation for listening and understanding in noise or large groups while only using one ear. Patient was further counseled on the option to consider remote microphone technology to aid these situations. A this time, patient is not interested in trying other assistive listening devices.   ??? Datalogging: 10.5 hrs/day    Follow-up Recommendations:  Regular daily use of hearing aid was encouraged.  Patient [facility] to [facility] aid fitting once hearing aid is received. Patient will be called to schedule.      Alvina Chou, AuD, 07/04/2019  Audiologist     Education:  Topic: Hearing aid(s)  Person Taught: patient  Barrier: None  Needs: Knows well  Teaching Method: Teacher, English as a foreign language and Verbal instruction    Outcome: Able to repeat information

## 2019-07-02 ENCOUNTER — Ambulatory Visit: Payer: MEDICARE

## 2019-07-02 DIAGNOSIS — G7000 Myasthenia gravis without (acute) exacerbation: Secondary | ICD-10-CM

## 2019-07-02 DIAGNOSIS — S161XXS Strain of muscle, fascia and tendon at neck level, sequela: Secondary | ICD-10-CM

## 2019-07-02 DIAGNOSIS — M26629 Arthralgia of temporomandibular joint, unspecified side: Secondary | ICD-10-CM

## 2019-07-02 DIAGNOSIS — G8929 Other chronic pain: Secondary | ICD-10-CM

## 2019-07-02 DIAGNOSIS — M542 Cervicalgia: Secondary | ICD-10-CM

## 2019-07-02 DIAGNOSIS — M7918 Myalgia, other site: Secondary | ICD-10-CM

## 2019-07-02 NOTE — Progress Notes
[facility] Progress Note     PATIENT: [name]  MRN: [id number]  DOB: [birth year]  DATE OF SERVICE: 07/03/2019  REFERRING PRACTITIONER: No ref. provider found  PRIMARY CARE PROVIDER: [place] Ram., MD    Chief Complaint   Patient presents with   ??? Neck Pain   ??? Knee Pain     Subjective:      History of Present Illness:  [name] is a 84 y.o. y.o. female with PMH of  has no past medical history on file. who presents for follow-up cervical and lumbar spondylosis.     Follow-up 07/03/2019   - neck pain and left knee pain persists   - overall doesn't feel well, wondering if stopping her mestinon contributed, had upcoming neurology appointment  - brought TENS unit in today, was able to get it to work, but wondered where she should put the pads    Follow-up 06/12/19  - treatments help temporarily with the neck pain  - using shiatsu neck massager  - has TENS unit, wondering where to place     Follow-up 05/21/19  - feels the treatments last visit did help reduce her pain temporarily   - still with persistent neck pain and left knee weakness  - got neck and shoulder massager and using it at home  - purchased TENS unit but forgot to bring it today  - [place]    Initial Consult 04/16/19  # Chronic neck and shoulder pain  - present for many years, gradual onset, no inciting event or trauma. Underlying myasthenia gravis.   - Prior work-up: MR cervical spine with multilevel degenerative disc disease in setting of C4-C7 congenital cervical spinal canal stenosis, neural foraminal narrowing, facet joint degeneration   - Interventions: no medications for pain; was going to start PT but didn't go due to pandemic  - Self-care: cervical collar sometimes if needed to bend down  - Timing/Duration: constant, associated with activities   - Location/Radiation: bilateral neck, no radiation  - Quality: aching, sharp at times  - Severity: varies between 5 to 10 out of 10  - Modifiers: worse with activity. better with rest.   - Associated s/s: diffuse generalized weakness of bilateral lower extremities (thinks its related to myasthenia gravis), leg pain  - Denies: numbness, tingling, upper extremity weakness, changes in bowel or bladder, changes in gait  ???  # Myasthenia gravis  - hospitalized 11/2018 for exacerbation, receiving IVIG infusions regularly  - uses a walker / cane sometimes  ???  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 Husband died 2 years ago of CHF.   BM: intermittent constipation, diarrhea   Temperature: tends to run cold  Supplements: none    Review of Systems:  A 14 point review of systems was completed on intake and fully reviewed with patient.  Pertinent areas discussed and documented in HPI or noted below.       Medications that the patient states to be currently taking   Medication Sig   ??? Albuterol Sulfate AEPB Inhale Takes 1 to 2 puffs every 4 hours as needed for wheezing or SOB .   ??? azaTHIOprine 50 mg tablet Take 1.5 tablets (75 mg total) by  mouth two (2) times daily.   ??? buPROPion, SR, (WELLBUTRIN SR) 200 mg 12 hr tablet Take 1 tablet (200 mg total) by mouth two (2) times daily.   ??? calcium carbonate 1250 (500 Ca) MG chewable tablet Chew by mouth.   ??? clonazePAM 1 mg disintegrating tablet Take 1 tablet (1 mg total) by mouth at bedtime. Max Daily Amount: 1 mg   ??? clonazePAM 1 mg tablet Take 1 tablet (1 mg total) by mouth three (3) times daily. Max Daily Amount: 3 mg   ??? dexlansoprazole 60 mg DR capsule Take 1 capsule (60 mg total) by mouth daily.   ??? GAMMAGARD 30 GM/300ML SOLN every twenty one (21) days .   ??? lidocaine 5% patch Place 1 patch onto the skin every twelve (12) hours 12 hours on 12 hours off.Marland Kitchen   ??? lidocaine-prilocaine 2.5-2.5% cream two (2) times daily as needed .   ??? montelukast 10 mg tablet Take 10 mg by mouth as needed for.   ??? nitrofurantoin 100 mg capsule as needed for .   ??? phenazopyridine 200 mg tablet Take 1 tablet (200 mg total) by mouth three (3) times daily with meals.   ??? trimethoprim 100 mg tablet Take 1 tablet (100 mg total) by mouth daily.     Allergies   Allergen Reactions   ??? Beta Adrenergic Blockers Other (See Comments)     Avoid d/t Myasthenia Gravis  Avoid d/t Myasthenia Gravis     ??? Macrolides And Ketolides Other (See Comments)     Avoid d/t Myasthenia Gravis  Use with caution d/t Myasthenia Gravis  Use with caution d/t Myasthenia Gravis     ??? Sulfa Antibiotics Other (See Comments)     May possibly have caused deafness in one ear  May possibly have caused deafness in one ear  other     ??? Botulinum Toxins Other (See Comments)     Muscle weakness r/t Myasthenia Gravis  Muscle weakness r/t Myasthenia Gravis     ??? Statins Other (See Comments)     Muscle aches  Muscle aches  Muscle aches     ??? Plastic Tape Itching and Other (See Comments)     Turns the skin red where applied     Patient Active Problem List    Diagnosis Date Noted   ??? Refractive error 04/28/2019   ??? Ptosis of eyelid 04/28/2019   ??? Pseudophakia of both eyes 04/28/2019   ??? Benzodiazepine dependence (HCC/RAF) 12/20/2018   ??? Breast cancer (HCC/RAF) 12/20/2018   ??? Spondylosis without myelopathy or radiculopathy, lumbar region 11/28/2018   ??? Anxiety 11/27/2018   ??? CKD (chronic kidney disease), stage III 11/27/2018   ??? Nocturnal hypoxemia 04/07/2018     Last Assessment & Plan:   We discussed potential impact of hypoxemia from her COPD on other body function  Plan-overnight oximetry     ??? Major depression, recurrent, chronic (HCC/RAF) 12/16/2017   ??? Diabetes mellitus (HCC/RAF) 12/16/2017   ??? Carcinoma in situ of breast 07/24/2017     Last Assessment & Plan:   07/19/2017???Screening detected right breast calcifications and distortion. ???The distortion was in the upper outer quadrant: Biopsy DCIS intermediate grade with CSL; calcifications UIQ: 2.8 cm: Biopsy DCIS+ ALH +CSL; 4 cm apart, axilla negative, Tis NX stage 0  ???  Significant delay in decision making because of psychosocial issues and myasthenia gravis.  Patient refused surgery and went on Tamoxifen 10 mg daily for 1 month and discontinued it.  Started anastrozole  01/30/1999 19/2 a tablet daily    Anastrozole toxicities:    Breast cancer surveillance: Mammogram 08/15/2018: Biopsy-proven sites of DCIS and ALH are stable, breast density category C    Continue every 45-month mammograms and follow-up.     ??? Osteoarthritis of knee 05/17/2017   ??? H/O arthroscopy 03/06/2017   ??? Myasthenia gravis with exacerbation (HCC/RAF) 12/18/2016   ??? Vaginal atrophy 08/16/2016   ??? Acute medial meniscal tear, left, subsequent encounter 07/18/2016   ??? Renal cyst 03/12/2016   ??? Asthmatic bronchitis, mild persistent, with acute exacerbation 12/29/2015     Last Assessment & Plan:   Continue rescue inhaler as needed. Try adding sample Bevespi maintenance inhaler to see if this helps.  She is currently on prednisone 20 mg daily maintenance and I don't think adding a steroid inhaler component will make any difference.     ??? Urethral caruncle 07/26/2015   ??? Chronic cholecystitis 01/14/2015   ??? Angioedema 05/23/2014     Attributed to injected methylprednisolone    Last Assessment & Plan:   We discussed limited ability to test for possible triggers. We are asking our labs sources about ability to test against methylprednisolone for injection.  A different brand might be worth trying. She can try pre-medicating with an antihistamine.     ??? Chronic interstitial cystitis 10/01/2011   ??? Insomnia 05/31/2007     Qualifier: Diagnosis of   By: Maple Hudson MD, Clinton D    Last Assessment & Plan:   I'm concerned that some of her sleep problems may reflect obstructive sleep apnea based on her palate and which she can tell me about her sleep patterns without a witness. We also want to know about oxygenation during sleep.  Plan-schedule sleep study  Qualifier: Diagnosis of   By: Maple Hudson MD, Clinton D      Last Assessment & Plan:   I'm concerned that some of her sleep problems may reflect obstructive sleep apnea based on her palate and which she can tell me about her sleep patterns without a witness. We also want to know about oxygenation during sleep.  Plan-schedule sleep study     ??? COPD mixed type (HCC/RAF) 04/30/2007     Office Spirometry 03/16/2016-severe obstructive airways disease FVC 1.59/59%, FEV1 0.98/49%, ratio 0.62, FEF 25???75% 0.42/28%  PFT 04/16/16-severe obstructive airways disease with slight response to bronchodilator, severe diffusion defect. FEV1/FVC 0.64, DLCO 45%    Last Assessment & Plan:   Severe obstructive airways disease. She can't hear herself wheeze.  Plan-try samples of Trelegy instead of Anoro for comparison    Overview:   Office Spirometry 03/16/2016-severe obstructive airways disease FVC 1.59/59%, FEV1 0.98/49%, ratio 0.62, FEF 25???75% 0.42/28%  PFT 04/16/16-severe obstructive airways disease with slight response to bronchodilator, severe diffusion defect. FEV1/FVC 0.64, DLCO 45%    Last Assessment & Plan:   Severe obstructive airways disease. She can't hear herself wheeze.  Plan-try samples of Trelegy instead of Anoro for comparison  Office Spirometry 03/16/2016-severe obstructive airways disease FVC 1.59/59%, FEV1 0.98/49%, ratio 0.62, FEF 25???75% 0.42/28%  PFT 04/16/16-severe obstructive airways disease with slight response to bronchodilator, severe diffusion defect. FEV1/FVC 0.64, DLCO 45%    Last Assessment & Plan:   Severe COPD but mainly emphysema with limited bronchospasm component that could be addressed with bronchodilators.  Plan-try sample Bevespi as a maintenance controller, refill pro-air     ??? Esophageal reflux 04/30/2007     History of fundoplication     Last Assessment & Plan:  Emphasized continued attention to antireflux measures. Reminded that reflux can be a significant trigger for irritable airways, cough and wheeze.  History of fundoplication       Last Assessment & Plan:   Emphasized continued attention to antireflux measures. Reminded that reflux can be a significant trigger for irritable airways, cough and wheeze.     ??? 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 Management is mostly by her ENT physicians.     ??? Fibromyalgia 04/30/2007     Qualifier: Diagnosis of   By: Yetta Barre CNA/MA, Rose Fillers: Diagnosis of   By: Yetta Barre CNA/MA, Shanda Bumps     ??? Seasonal and perennial allergic rhinitis 04/30/2007     Qualifier: Diagnosis of   By: Yetta Barre CNA/MA, Shanda Bumps     Last Assessment & Plan:   She is now on prednisone 20 mg daily. I suggested she wait until her myasthenia status is stabilized. Then if she needs to she couldn't seek evaluation at one of the allergy practices in town as discussed, since our vaccine program will be closing.  Qualifier: Diagnosis of   By: Yetta Barre CNA/MA, Jessica       Last Assessment & Plan:   She is now on prednisone 20 mg daily. I suggested she wait until her myasthenia status is stabilized. Then if she needs to she couldn't seek evaluation at one of the allergy practices in town as discussed, since our vaccine program will be closing.         Objective:   BP 159/73  ~ Pulse 98  ~ Temp 36.5 ???C (97.7 ???F) (Forehead)     Physical Exam:  General: alert, appears stated age and cooperative  Eyes: conjunctiva and lids normal  ENMT: normal external nose and ears.  normal oropharynx  Lungs:normal respiratory effort.  no cyanosis.  Extremities:extremities normal, atraumatic, no cyanosis or edema  Skin: warm and dry, no rashes.  Psych: alert and oriented to person, place and time  MSK: tenderness of below muscle groups     Trigger/ Tender Points Identification:         Assessment and Plan   [name] is a 84 y.o. y.o. female who presents with   1. Chronic neck pain  Acupuncture    INJECT TRIGGER POINTS, > 3   2. Cervical myofascial strain, sequela  Acupuncture    INJECT TRIGGER POINTS, > 3   3. Myofascial pain  Acupuncture    INJECT TRIGGER POINTS, > 3   4. Myasthenia gravis without (acute) exacerbation (HCC/RAF)     5. TMJ syndrome       # Chronic neck pain  # Cervical myofascial pain  Temporarily improved treatments, overall stable but persistent. Patient with chronic neck pain for years, underlying myasthenia gravis, and MRI with multilevel degenerative changes and neural foraminal narrowing. Exam reveals multiple trigger points suggesting significant myofascial component to pain.   - repeat acupuncture today  - repeat trigger point injections today   - reinforced TENS unit use, demonstrated to place pads on upper neck and trapezius    - continue neck and heat shiatsu massage device   - gentle neck stretching  ???  # Myasthenia gravis   - Qi tonic foods   - can try American ginseng tea    Treatments Provided Today:  Acupuncture point interventions:    LI-4/Lv-3, Sp-6/St-36 LI-10 Sp-10           Heding     Acupuncture Treatment Duration: 20 Minutes  Injection of : Tendon Sheath with 0.71mL 1% lidocaine      Trapezius, Sternocleidomastoid, Splenius cervicis Levator scapulae, Rhomboids, Infraspinatus                Trigger Point Injection Procedure Note   Procedure: Trigger point injection of Trapezius, Sternocleidomastoid, Splenius cervicis Levator scapulae, Rhomboids, Infraspinatus              Indication: Myofascial pain; pain or altered sensation in the expected distribution of referred pain from a trigger point; taut band palpable in an accessible muscle with exquisite tenderness at one point along the length of it; some degree of restricted motion when measurable; reproduction of pain by stimulating trigger point  Consent: The risks (infection, bleeding, pain) and benefits of trigger points injections were explained to the patient and alternatives were discussed. All questions were answered to the patient's satisfaction. The patient verbalized understanding and consented to the trigger point injection(s).  Sterilization: The target area of the active trigger point(s) was sterilized using a 70% isopropyl alcohol pad.   Procedure: Manual palpation was used to identify the target area. Using a 1-inch 23 gauge and/or 0.5-inch 25 gauge needle, the following procedure was completed:   Injection of : Tendon Sheath with 0.32mL 1% lidocaine   into Trapezius, Sternocleidomastoid, Splenius cervicis Levator scapulae, Rhomboids, Infraspinatus              The needle was infiltrated 1-3 cm in a fanwise method into each trigger point location. Manual pressure was applied after the procedure. There was no significant blood loss. The patient tolerated the procedure well.    Follow-up     Return in about 3 weeks (around 07/24/2019) for Blake Divine / K Hui.    Future Appointments   Date Time Provider Department Center   07/04/2019 11:45 AM Alvina Chou, AuD MP2 AUD AUDIOLOGY/SP   07/14/2019 10:00 AM [facility] NEUMUSC B200 NEUROLOGY   07/17/2019  1:30 PM Trenda Moots., MD ESTWS [id number] MEDICINE   07/29/2019 11:20 AM [place] Ram., MD GERI IMS 420 MEDICINE   07/31/2019  1:30 PM Trenda Moots., MD ESTWS [id number] MEDICINE   05/03/2020  1:30 PM Sherilyn Banker, OD DS VIS REHAB OPH       Seen and discussed with Dr. Kathalene Frames    Author: Will Bonnet, MD, 07/03/2019, 4:32 PM   [facility] Fellow and Clinical Instructor    Patient was seen and examined with the fellow Dr. Will Bonnet. I agree with the findings/assessment/plan we formulated together as documented above.    Kathalene Frames MD MPH  [facility]  [facility]  Pgr [phone]  07/03/2019 4:32 PM

## 2019-07-03 ENCOUNTER — Ambulatory Visit: Payer: MEDICARE

## 2019-07-04 ENCOUNTER — Ambulatory Visit: Payer: BLUE CROSS/BLUE SHIELD | Attending: Audiologist

## 2019-07-04 ENCOUNTER — Ambulatory Visit: Payer: MEDICARE

## 2019-07-05 DIAGNOSIS — H905 Unspecified sensorineural hearing loss: Secondary | ICD-10-CM

## 2019-07-05 DIAGNOSIS — H903 Sensorineural hearing loss, bilateral: Secondary | ICD-10-CM

## 2019-07-05 NOTE — Patient Instructions
Accessing Audiology notes online:    The report is viewable in [facility] ''MyChart'' under clinical notes    1. Hover your mouse over the ''Visits'' icon.   2. Click on the ''Appointments and Visits'' link.   3. Click on the desired visit.   4. Click on the ''Clinical Notes'' tab located below the ''Appointment Details'' header.   5. Listed are the types of Ambulatory Notes you may see:   ? Progress Notes   ? Consult   ? Procedure   ? H&P   ? Goals of Care   ? Interdisciplinary Notes     **All of Audiology visit notes (Reports) are considered ''Consult Notes''.     Please call [phone] for additional help if you still cannot see notes.         A copy of the audiogram/reports can be obtained by contacting Medical Records:    Phone: [phone] [facility] Medical Records    In person during the hours of 8:00 AM-4:30 PM, Monday-Friday    [facility]- [address], [address] [place], [place] [address]    [facility]- [address], [address], [place] [address]          Brayton Mars acceder a sus informes de Audiolog???a en l???nea:      El informe se puede ver en [facility] ''MyChart''. ?Se encuentra bajo notas cl???nicas.      Para acceder al informe:   1.  Pose el cursor [employer] ???icono de ???Visits.??? (consultas)  2.  Presione el enlace de ???Appointments and Visits??? (citas y consultas)  3.  Haga clic en la consulta deseada  4.  Haga clic en la pesta???a ''Clinical notes??? (notas cl???nicas), situado debajo del rubro ''Appontment  Details'' (detalles de la cita)  5.  A continuaci???n, se enumeran los tipos de notas ambulatorias que podr??? ver:   ? Progress Notes (notas evolutivas)  ? Consult (interconsulta)   ? Procedure (procedimiento)    ? H&P (historial m???dico y un examen f???sico)   ? Goals of Care (metas de tratamiento)   ? Interdisciplinary Notes (notas interdisciplinarias)    **Todas las notas de la consulta en Audiolog???a (Reports) se consideran ???Consult notes??? (notas de interconsulta)     Por favor llame al [phone] para obtener ayuda adicional si todav???a no puede ver las notas.        Una copia del audiograma/informes se pueden obtener al comunicarse con el archivo de historia cl???nica     Llame por Tel???fono al: [phone], [facility] Medical Records (archivo de historia cl???nica en [place])     Atenci???n disponible en persona durante las horas entre 8:00 AM-4:30 PM, de lunes a viernes     [facility]- [address], [address] [place], [place] [address]     [facility]- [address], [address], [place] [address]

## 2019-07-06 ENCOUNTER — Ambulatory Visit: Payer: MEDICARE

## 2019-07-06 ENCOUNTER — Ambulatory Visit: Payer: BLUE CROSS/BLUE SHIELD

## 2019-07-06 DIAGNOSIS — M542 Cervicalgia: Secondary | ICD-10-CM

## 2019-07-06 DIAGNOSIS — G7000 Myasthenia gravis without (acute) exacerbation: Secondary | ICD-10-CM

## 2019-07-06 MED ORDER — PYRIDOSTIGMINE BROMIDE 60 MG PO TABS
60 mg | ORAL_TABLET | Freq: Two times a day (BID) | ORAL | 1 refills | Status: AC
Start: 2019-07-06 — End: ?

## 2019-07-07 ENCOUNTER — Ambulatory Visit: Payer: BLUE CROSS/BLUE SHIELD

## 2019-07-08 ENCOUNTER — Telehealth: Payer: MEDICARE

## 2019-07-08 NOTE — Telephone Encounter
Left another message about scheduling the Interstim with Dr Rodney Cruise, to please call me back if she's ready to schedule

## 2019-07-11 ENCOUNTER — Ambulatory Visit: Payer: MEDICARE

## 2019-07-14 ENCOUNTER — Ambulatory Visit: Payer: BLUE CROSS/BLUE SHIELD | Attending: Neurology

## 2019-07-14 DIAGNOSIS — G7000 Myasthenia gravis without (acute) exacerbation: Secondary | ICD-10-CM

## 2019-07-14 DIAGNOSIS — D508 Other iron deficiency anemias: Secondary | ICD-10-CM

## 2019-07-14 DIAGNOSIS — G2581 Restless legs syndrome: Secondary | ICD-10-CM

## 2019-07-14 NOTE — Progress Notes
[facility] Progress Note    PATIENT: [name]  MRN:  [id number]  DOB:  [birth year]  DATE OF SERVICE:  07/14/2019  ATTENDING PHYSICIAN: Kaitlyn Pink., MD, PhD  PRIMARY CARE PHYSICIAN: Kaitlyn Ram., MD    ID/CC: [name] is a 84 years old female with a pmhx of seropositive initially ocular now generalized myasthenia gravis, breast cancer, interstitial cystitis, fibromyalgia, COPD (ex-smoker), asthma, referred for management of MG.     Interval Events:    Last visit 04/14/19  At last visit Mestinon was discontinued. In the interim since her last visit patient has requested to go back onto Mestinon. Overall since last visit, patient endorses that she feels she is progressively getting weaker. She feels that in the past 3 months she has been getting exhausted faster and is not able to do as many things as before. At this time she estimates that she is only able to walk about 2 blocks before her legs get tired and she has to rest. Prior to 14mo ago she endorses being able to walk about 4 blocks before this would occur. Additionally she endorses that after any outing or errands she does, no matter how little physical activity she actually does she will feel very tired after and have to go home and rest for the entire remainder of the day. Feels tired throughout the day and feels that she is not getting good sleep. Has kicking sensation in both legs that keeps her up during the night. Does not feel resting in the morning.   Still states that she feels her speech gets more slurred, feels more fatigue, and more blurry vision with reading/watching TV in the days prior to her next IVIG treatment. Next IVIG is scheduled for next week. She has just started home physical therapy this week and only has had the initial visit thus far. Has been going to acupuncture for neck pain which she endorses is helping.     Current Regimen:  Imuran 75 mg BID  Mestinon 60 mg BID  IVIG 60 g Q 3w     Previously Failed Prednisone - oral ulcers    Past Medical History:  Fibromyalgia    Breast cancer (2017) - contained, no tx   Interstitial cystitis x 30 years   Fall with brief LOC (09/2016)  Fall with knee infection/contusion (10/2018)  Depression and anxiety   Diverticulitis   COPD  Asthma     Past Surgical History:  Meniscus tear repair in left knee    Family History:   Adopted at birth. Birth mother may have had breast cancer.   Brother(58) and sister (42) were both adopted from different families    Social History:   She moved from her independent living place in [place], [place] to [place] with her daugther.   Pt  reports that she quit smoking about 36 years ago. She has quit using smokeless tobacco. Smoked 0.5 PPD from 1960 -1985. Denies EtOH or recreational drug use. Retired, college-educated, widowed.     Allergies:  Allergies   Allergen Reactions   ??? Beta Adrenergic Blockers Other (See Comments)     Avoid d/t Myasthenia Gravis  Avoid d/t Myasthenia Gravis     ??? Macrolides And Ketolides Other (See Comments)     Avoid d/t Myasthenia Gravis  Use with caution d/t Myasthenia Gravis  Use with caution d/t Myasthenia Gravis     ??? Sulfa Antibiotics Other (See Comments)     May possibly have caused  deafness in one ear  May possibly have caused deafness in one ear  other     ??? Botulinum Toxins Other (See Comments)     Muscle weakness r/t Myasthenia Gravis  Muscle weakness r/t Myasthenia Gravis     ??? Statins Other (See Comments)     Muscle aches  Muscle aches  Muscle aches     ??? Plastic Tape Itching and Other (See Comments)     Turns the skin red where applied        Medications:     Outpatient Medications Prior to Visit   Medication Sig   ??? Albuterol Sulfate AEPB Inhale Takes 1 to 2 puffs every 4 hours as needed for wheezing or SOB .   ??? azaTHIOprine 50 mg tablet Take 1.5 tablets (75 mg total) by mouth two (2) times daily.   ??? buPROPion, SR, (WELLBUTRIN SR) 200 mg 12 hr tablet Take 1 tablet (200 mg total) by mouth two (2) times daily.   ??? calcium carbonate 1250 (500 Ca) MG chewable tablet Chew by mouth.   ??? clonazePAM 1 mg disintegrating tablet Take 1 tablet (1 mg total) by mouth at bedtime. Max Daily Amount: 1 mg   ??? clonazePAM 1 mg tablet Take 1 tablet (1 mg total) by mouth three (3) times daily. Max Daily Amount: 3 mg   ??? dexlansoprazole 60 mg DR capsule Take 1 capsule (60 mg total) by mouth daily.   ??? GAMMAGARD 30 GM/300ML SOLN every twenty one (21) days .   ??? lidocaine 5% patch Place 1 patch onto the skin every twelve (12) hours 12 hours on 12 hours off.Marland Kitchen   ??? lidocaine-prilocaine 2.5-2.5% cream two (2) times daily as needed .   ??? montelukast 10 mg tablet Take 10 mg by mouth as needed for.   ??? phenazopyridine 200 mg tablet Take 1 tablet (200 mg total) by mouth three (3) times daily with meals.   ??? pyridostigmine 60 mg tablet Take 1 tablet (60 mg total) by mouth two (2) times daily.   ??? trimethoprim 100 mg tablet Take 1 tablet (100 mg total) by mouth daily.   ??? nitrofurantoin 100 mg capsule as needed for .     No facility-administered medications prior to visit.          Physical Exam  24 hour vitals:    BP 155/75  ~ Pulse 92  ~ Temp 36.3 ???C (97.3 ???F) (Temporal)  ~ Ht 5' 4'' (1.626 m)  ~ Wt 145 lb 8 oz (66 kg)  ~ BMI 24.98 kg/m???        Gen: NAD, well-appearing, cooperative, appears younger than stated age.  Resp:  Able to count to 18 on a single breath.     Neurological Exam  Mental Status  Awake, alert and oriented to person, place and time. Recent and remote memory are intact. Mild dysarthria present. Language is fluent with no aphasia. Attention and concentration are normal. Fund of knowledge is appropriate for level of education.    Cranial Nerves  CN II: Visual fields full to confrontation. Blurry vision but no double vision.  CN III, IV, VI: Extraocular movements intact bilaterally.  CN V: Facial sensation is normal.  CN VII: Full and symmetric facial movement.  CN VIII: Hearing is normal.  CN IX, X: Palate elevates symmetrically. Normal gag reflex.  CN XI: Shoulder shrug strength is normal.  CN XII: Tongue midline without atrophy or fasciculations.  No left ptosis .  Motor  Normal muscle bulk throughout. No fasciculations present. Normal muscle tone.                                             Right                     Left  Neck flexion                           4-                          4-  Neck extension                      4-                          4-   Shoulder abduction               4-                          4-  Elbow flexion                         4-                          4-  Elbow extension                    4-                          4-  Wrist flexion                           4-                          4-  Wrist extension                      4-                          4-  Hip flexion                              4-                          4-  Knee extension                      4-                          4-  Plantarflexion                         4  4  Dorsiflexion                            4                          4    Sensory  Light touch is normal in upper and lower extremities.     Reflexes                                           Right                      Left  Brachioradialis                    3+                         3+  Biceps                                 2+                         2+  Triceps                                2+                         2+  Patellar                                3+                         3+  Achilles                                2+                         2+    Right pathological reflexes: Hoffmann's absent. Ankle clonus absent.  Left pathological reflexes: Hoffmann's absent. Ankle clonus absent.    Coordination  Mild bilateral intention tremors..    Gait  Casual gait: Unable to rise from chair without using arms.  Walks slowly with stooped posture and spatic left leg. .    Labs   General Labs: no new labs  ???  Labs 08/26/2015: AChR binding (0.63) blocking (28) antibody, ESR 3, CRP 4.9    Imaging and Diagnostics     DXA 06/17/2019    Impression: The patient has low bone mass, based on the Left Total Hip T-score. The patient has an estimated ten-year risk of hip fracture of 3% and an estimated ten-year risk of major fracture of 13%, based on the [facility] FRAX algorithm.    Repetitive Nerve stimulation in office 12/23/2018: No decrement after stimulation of CN XI and left ulnar nerve     (from neurologist Dr. Eliane Decree note)    MRI/MRA head 09/09/2015:  This  MR angiogram of the intracerebral arteries shows the following:  1. ??????Very minimal atherosclerotic change within the left posterior cerebral artery that is unlikely to be clinically significant.  2. ??????There is a normal variant with the left posterior cerebral artery obtaining its flow from the anterior circulation.  3. ?????????No aneurysms are seen.  4. ?????????No new findings compared to the 11/25/2003 MRA. ??????  ???  MRI orbit wwo contrast 09/09/2015: This is is a normal MRI of the orbits with and without contrast.    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 Uncovertebral joint osteophytes cause severe right C3-C4,   severe right and moderate left C5-C6 and severe left and mild right C6-C7 neural foraminal narrowing. Facet joint degeneration is severe on the left at C2-C3, the right at C3-C4 and C4-C5, and is moderate bilateral and C7-T1.     ASSESSMENT & PLAN:  Mrs. Bowerman is a 84 years old female with a pmhx of seropositive initially ocular (2017) now generalized myasthenia gravis (AchR binding and blocking +), breast cancer, interstitial cystitis, fibromyalgia, COPD, asthma, 11/2018 with MG exacerbation s/p PLEX and found to have UTI, who is following up for management of MG. Endorses overall decline in function and increased fatigue. At this time there are multiple potential reasons for her increased fatigue and decreased endurance capacity. Question possible cervical myelopathy and generalized deconditioning playing a role in affecting her gait/endruance, and patient has just started home PT for this.  Additionally patient endorses increased day time fatigue with poor sleep and symptoms suggestive of restless leg syndrome. Will evaluate patient for potential iron deficiency as this can exacerbate restless leg syndrome and arrange for sleep study.  With regards to myasthenia gravis will increase patient's IVIG schedule to q2 weeks as patient endorses increased frequency of developing dysarthria/dysphagia, blurry vision, gen weakness faster than before prior to her next infusion.     Plan   - continue Azathioprine 75 mg BID   - continue Mestinon BID  - check CBC, Iron studues   - IVIg 60g (1g/kg) Q 2 weeks ([place]) and monitor response  - Sleep Study to evaluate for OSA vs Restless Leg syndrome  - continue with  Home PT     RTC in 3 months. Patient discussed with attending, Dr. Skeet Simmer, MD  PM&R PGY IV     Note: greater than 30 minutes spent on this encounter in consultation and in coordination of care.    The patient was seen and examined with Dr. Kandis Fantasia PM&R Resident. I was present for the key portions of the history and video examination. We formulated the assessment and plan together. I agree with the note above, which reflects the details of our evaluation.    Jake Shark, M.D. Ph.D.

## 2019-07-15 ENCOUNTER — Telehealth: Payer: BLUE CROSS/BLUE SHIELD

## 2019-07-16 ENCOUNTER — Telehealth: Payer: MEDICARE

## 2019-07-16 DIAGNOSIS — M26629 Arthralgia of temporomandibular joint, unspecified side: Secondary | ICD-10-CM

## 2019-07-16 DIAGNOSIS — M542 Cervicalgia: Secondary | ICD-10-CM

## 2019-07-16 DIAGNOSIS — M7918 Myalgia, other site: Secondary | ICD-10-CM

## 2019-07-16 DIAGNOSIS — G7000 Myasthenia gravis without (acute) exacerbation: Secondary | ICD-10-CM

## 2019-07-16 DIAGNOSIS — S161XXS Strain of muscle, fascia and tendon at neck level, sequela: Secondary | ICD-10-CM

## 2019-07-16 NOTE — Progress Notes
[facility] Progress Note     PATIENT: [name]  MRN: [id number]  DOB: [birth year]  DATE OF SERVICE: 07/17/2019  REFERRING PRACTITIONER: No ref. provider found  PRIMARY CARE PROVIDER: York Ram., MD    Chief Complaint   Patient presents with   ??? Neck Pain   ??? Knee Pain     Subjective:      History of Present Illness:  [name] is a 84 y.o. y.o. female with PMH of  has no past medical history on file. who presents for follow-up cervical and lumbar spondylosis.     Follow-up 07/17/2019   - neck pain persistent, but treatments help temporarily  - saw neuromuscular, ordered sleep study, had IVIG yesterday with improvement of symptoms   - using TENS unit for neck and heating pad, with some help    Follow-up 07/03/19  - neck pain and left knee pain persists   - overall doesn't feel well, wondering if stopping her mestinon contributed, had upcoming neurology appointment  - brought TENS unit in today, was able to get it to work, but wondered where she should put the pads    Follow-up 06/12/19  - treatments help temporarily with the neck pain  - using shiatsu neck massager  - has TENS unit, wondering where to place     Follow-up 05/21/19  - feels the treatments last visit did help reduce her pain temporarily   - still with persistent neck pain and left knee weakness  - got neck and shoulder massager and using it at home  - purchased TENS unit but forgot to bring it today  - moved to [place]    Initial Consult 04/16/19  # Chronic neck and shoulder pain  - present for many years, gradual onset, no inciting event or trauma. Underlying myasthenia gravis.   - Prior work-up: MR cervical spine with multilevel degenerative disc disease in setting of C4-C7 congenital cervical spinal canal stenosis, neural foraminal narrowing, facet joint degeneration   - Interventions: no medications for pain; was going to start PT but didn't go due to pandemic  - Self-care: cervical collar sometimes if needed to bend down  - Timing/Duration: constant, associated with activities   - Location/Radiation: bilateral neck, no radiation  - Quality: aching, sharp at times  - Severity: varies between 5 to 10 out of 10  - Modifiers: worse with activity. better with rest.   - Associated s/s: diffuse generalized weakness of bilateral lower extremities (thinks its related to myasthenia gravis), leg pain  - Denies: numbness, tingling, upper extremity weakness, changes in bowel or bladder, changes in gait  ???  # Myasthenia gravis  - hospitalized 11/2018 for exacerbation, receiving IVIG infusions regularly  - uses a walker / cane sometimes  ???  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 Husband died 2 years ago of CHF.   BM: intermittent constipation, diarrhea   Temperature: tends to run cold  Supplements: none    Review of Systems:  A 14 point review of systems was completed on intake and fully reviewed with patient.  Pertinent areas discussed and documented in HPI or noted below.       Medications that the patient states to be  currently taking   Medication Sig   ??? Albuterol Sulfate AEPB Inhale Takes 1 to 2 puffs every 4 hours as needed for wheezing or SOB .   ??? azaTHIOprine 50 mg tablet Take 1.5 tablets (75 mg total) by mouth two (2) times daily.   ??? buPROPion, SR, (WELLBUTRIN SR) 200 mg 12 hr tablet Take 1 tablet (200 mg total) by mouth two (2) times daily.   ??? calcium carbonate 1250 (500 Ca) MG chewable tablet Chew by mouth.   ??? clonazePAM 1 mg disintegrating tablet Take 1 tablet (1 mg total) by mouth at bedtime. Max Daily Amount: 1 mg   ??? clonazePAM 1 mg tablet Take 1 tablet (1 mg total) by mouth three (3) times daily. Max Daily Amount: 3 mg   ??? dexlansoprazole 60 mg DR capsule Take 1 capsule (60 mg total) by mouth daily.   ??? GAMMAGARD 30 GM/300ML SOLN every twenty one (21) days .   ??? lidocaine 5% patch Place 1 patch onto the skin every twelve (12) hours 12 hours on 12 hours off.Marland Kitchen   ??? lidocaine-prilocaine 2.5-2.5% cream two (2) times daily as needed .   ??? montelukast 10 mg tablet Take 10 mg by mouth as needed for.   ??? nitrofurantoin 100 mg capsule as needed for .   ??? phenazopyridine 200 mg tablet Take 1 tablet (200 mg total) by mouth three (3) times daily with meals.   ??? pyridostigmine 60 mg tablet Take 1 tablet (60 mg total) by mouth two (2) times daily.   ??? trimethoprim 100 mg tablet Take 1 tablet (100 mg total) by mouth daily.     Allergies   Allergen Reactions   ??? Beta Adrenergic Blockers Other (See Comments)     Avoid d/t Myasthenia Gravis  Avoid d/t Myasthenia Gravis     ??? Macrolides And Ketolides Other (See Comments)     Avoid d/t Myasthenia Gravis  Use with caution d/t Myasthenia Gravis  Use with caution d/t Myasthenia Gravis     ??? Sulfa Antibiotics Other (See Comments)     May possibly have caused deafness in one ear  May possibly have caused deafness in one ear  other     ??? Botulinum Toxins Other (See Comments)     Muscle weakness r/t Myasthenia Gravis  Muscle weakness r/t Myasthenia Gravis     ??? Statins Other (See Comments)     Muscle aches  Muscle aches  Muscle aches     ??? Plastic Tape Itching and Other (See Comments)     Turns the skin red where applied     Patient Active Problem List    Diagnosis Date Noted   ??? Refractive error 04/28/2019   ??? Ptosis of eyelid 04/28/2019   ??? Pseudophakia of both eyes 04/28/2019   ??? Benzodiazepine dependence (HCC/RAF) 12/20/2018   ??? Breast cancer (HCC/RAF) 12/20/2018   ??? Spondylosis without myelopathy or radiculopathy, lumbar region 11/28/2018   ??? Anxiety 11/27/2018   ??? CKD (chronic kidney disease), stage III 11/27/2018   ??? Nocturnal hypoxemia 04/07/2018        ??? Major depression, recurrent, chronic (HCC/RAF) 12/16/2017   ??? Diabetes mellitus (HCC/RAF) 12/16/2017   ??? Carcinoma in situ of breast 07/24/2017        ??? Osteoarthritis of knee 05/17/2017   ??? H/O arthroscopy 03/06/2017   ??? Myasthenia gravis with exacerbation (HCC/RAF) 12/18/2016   ??? Vaginal atrophy 08/16/2016   ??? Acute medial meniscal tear, left, subsequent encounter 07/18/2016   ???  Renal cyst 03/12/2016   ??? Asthmatic bronchitis, mild persistent, with acute exacerbation 12/29/2015        ??? Urethral caruncle 07/26/2015   ??? Chronic cholecystitis 01/14/2015   ??? Angioedema 05/23/2014        ??? Chronic interstitial cystitis 10/01/2011   ??? Insomnia 05/31/2007        ??? COPD mixed type (HCC/RAF) 04/30/2007        ??? Esophageal reflux 04/30/2007        ??? Eustachian tube dysfunction 04/30/2007        ??? Fibromyalgia 04/30/2007        ??? Seasonal and perennial allergic rhinitis 04/30/2007          Objective:   BP (!) 181/76  ~ Pulse 88  ~ Temp 36.2 ???C (97.1 ???F) (Oral)     Physical Exam:  General: alert, appears stated age and cooperative  Eyes: conjunctiva and lids normal  ENMT: normal external nose and ears.  normal oropharynx  Lungs:normal respiratory effort.  no cyanosis.  Extremities:extremities normal, atraumatic, no cyanosis or edema  Skin: warm and dry, no rashes.  Psych: alert and oriented to person, place and time  MSK: tenderness of below muscle groups     Trigger/ Tender Points Identification:         Assessment and Plan   [name] is a 84 y.o. y.o. female who presents with   1. Cervicalgia  Acupuncture    INJECT TRIGGER POINTS, > 3   2. Cervical myofascial strain, sequela  Acupuncture    INJECT TRIGGER POINTS, > 3   3. Myofascial pain  Acupuncture    INJECT TRIGGER POINTS, > 3   4. Myasthenia gravis without (acute) exacerbation (HCC/RAF)     5. TMJ syndrome       # Chronic neck pain  # Cervical myofascial pain  Temporarily improved treatments, overall stable but persistent. Patient with chronic neck pain for years, underlying myasthenia gravis, and MRI with multilevel degenerative changes and neural foraminal narrowing. Exam reveals multiple trigger points suggesting significant myofascial component to pain.   - repeat acupuncture today  - repeat trigger point injections today   - continue TENS unit use  - continue neck and heat shiatsu massage device   - gentle neck stretching  ???  # Myasthenia gravis   - Qi tonic foods   - can try American ginseng tea, recommended [address] [place]    Treatments Provided Today:     Acupuncture point interventions:    LI-4/Lv-3, Sp-6/St-36 LI-10 Sp-10       GB-20         Acupuncture Treatment Duration: 20 Minutes  Injection of : Trigger Points with 0.35mL 1% lidocaine      Trapezius, Splenius cervicis, Sternocleidomastoid Infraspinatus, Levator scapulae, Rhomboids                Trigger Point Injection Procedure Note   Procedure: Trigger point injection of Trapezius, Splenius cervicis, Sternocleidomastoid Infraspinatus, Levator scapulae, Rhomboids              Indication: Myofascial pain; pain or altered sensation in the expected distribution of referred pain from a trigger point; taut band palpable in an accessible muscle with exquisite tenderness at one point along the length of it; some degree of restricted motion when measurable; reproduction of pain by stimulating trigger point  Consent: The risks (infection, bleeding, pain) and benefits of trigger points injections were explained to the patient and alternatives were discussed. All  questions were answered to the patient's satisfaction. The patient verbalized understanding and consented to the trigger point injection(s).  Sterilization: The target area of the active trigger point(s) was sterilized using a 70% isopropyl alcohol pad.   Procedure: Manual palpation was used to identify the target area. Using a 1-inch 23 gauge and/or 0.5-inch 25 gauge needle, the following procedure was completed:   Injection of : Trigger Points with 0.63mL 1% lidocaine   into Trapezius, Splenius cervicis, Sternocleidomastoid Infraspinatus, Levator scapulae, Rhomboids              The needle was infiltrated 1-3 cm in a fanwise method into each trigger point location. Manual pressure was applied after the procedure. There was no significant blood loss. The patient tolerated the procedure well.    Follow-up     Return in about 2 weeks (around 07/31/2019) for [place] / K Hui, acu, TPI.    Future Appointments   Date Time Provider Department Center   07/22/2019  4:45 PM Alvina Chou, AuD MP2 AUD AUDIOLOGY/SP   07/29/2019 11:20 AM York Ram., MD GERI IMS 420 MEDICINE   07/31/2019  1:30 PM Trenda Moots., MD [facility] [phone] MEDICINE   08/21/2019  3:00 PM Trenda Moots., MD [facility] [phone] MEDICINE   10/20/2019 10:30 AM [facility] NEUMUSC B200 NEUROLOGY   05/03/2020  1:30 PM Shoku, Drue Flirt, OD DS VIS REHAB OPH       Seen and discussed with Dr. Kathalene Frames    Author: Will Bonnet, MD, 07/17/2019, 4:48 PM   [place] [facility] Fellow and Clinical Instructor    Patient was seen and examined with the fellow Dr. Will Bonnet. I agree with the findings/assessment/plan we formulated together as documented above.    Kathalene Frames MD MPH  [facility]  [facility]  Pgr [phone]  07/17/2019 4:48 PM

## 2019-07-16 NOTE — Telephone Encounter
Faxed out clinical notes to [facility] to continue to assist with IVIG: IVIg 60g (1g/kg) Q 2 weeks and monitor response:

## 2019-07-17 ENCOUNTER — Ambulatory Visit: Payer: MEDICARE

## 2019-07-18 NOTE — Consults
Adult Hearing Aid Fitting    [name] was fitted with a hearing aid for the right ear. Kaitlyn Ingramwas fitted with a BiCROS device on 05/23/2019 and returned the device on 07/04/2019 due to lack of benefit.     Hearing Aid(s)  Manufacturer: Phonak  Model: Audeo P70  Right SN: [id number]  Speaker Length & Power: 2S  Dome Size: Large vented  Battery Size: 312  Initial HAF: 07/22/2019  Hearing Aid Warranty Expiration: 10/06/2022  Right to Return Expiration Date: 09/05/2019    Accessories dispensed: 2 boxes of size 312 batteries      Fitting:  Hearing aids were verified by applying, real-ear-measures (REM) to an evidence-based fitting algorithm, NAL-NL2.  Hearing aid(s) were optimized to achieve the best speech intelligibility index given the degree and configuration of the hearing loss and the limitations of hearing aid amplification.    ??? Did not complete feedback in either ear for better fit to target and no feedback present in office.   ??? Gain: 100%   ??? Paired hearing aid to patient's phone and Phonak App. Successfully streamed phone call and music in office.     Hearing Aid Orientation:  Patient was instructed in care and use of hearing aids.  Techniques for insertion and removal, changing battery, and cleaning were demonstrated and discussed.  Troubleshooting was discussed.  The necessity for full-time hearing aid use was emphasized.  Battery safety and battery disposal were discussed, and written information provided.   Patient was instructed to ensure that batteries could not be handled nor ingested by a child/pets.     The benefits and limitations of hearing aids were discussed.  Patient was counseled regarding acclimatization period necessary for adjustment to new amplification.   Communication strategies, including importance of avoiding noise, reverberation and distance, were discussed.  The advantages of face-to-face communication, and use of visual speech cues (speech reading/lipreading) were emphasized. The necessity for conversation at a near distance, in a relatively quiet environment, and facing the speaker so that visual as well as optimal auditory cues are available was emphasized.  Assertive communication techniques were advised and demonstrated.    The limitations of hearing aid amplification were also discussed.  The benefits of remote microphone technology during communication in noisy and distant environments were discussed.        Plan:  ??? Return in 2 weeks for hearing aid follow up.  ??? Full time hearing aid was advised  ??? Patient was advised to return for regularly-scheduled hearing aid checks, and to contact the clinic immediately if hearing aids appeared to be malfunctioning, after troubleshooting at home.     ??? Completed CaptionCall application and will submit on patient's behalf.       Alvina Chou, AuD, 07/22/2019   Audiologist         Education:  Topic: Hearing aid(s)  Person Taught: patient  Barrier: None  Needs: Knows well  Teaching Method: Demonstration, Teacher, English as a foreign language and Verbal instruction    Outcome: Able to repeat information and Able to demonstrate what was taught

## 2019-07-21 ENCOUNTER — Ambulatory Visit: Payer: BLUE CROSS/BLUE SHIELD

## 2019-07-22 ENCOUNTER — Ambulatory Visit: Payer: BLUE CROSS/BLUE SHIELD | Attending: Audiologist

## 2019-07-22 ENCOUNTER — Ambulatory Visit: Payer: MEDICARE

## 2019-07-22 ENCOUNTER — Ambulatory Visit: Payer: BLUE CROSS/BLUE SHIELD

## 2019-07-25 DIAGNOSIS — H905 Unspecified sensorineural hearing loss: Secondary | ICD-10-CM

## 2019-07-25 DIAGNOSIS — H903 Sensorineural hearing loss, bilateral: Secondary | ICD-10-CM

## 2019-07-25 NOTE — Patient Instructions
Accessing Audiology notes online:    The report is viewable in [facility] ''MyChart'' under clinical notes    1. Hover your mouse over the ''Visits'' icon.   2. Click on the ''Appointments and Visits'' link.   3. Click on the desired visit.   4. Click on the ''Clinical Notes'' tab located below the ''Appointment Details'' header.   5. Listed are the types of Ambulatory Notes you may see:   ? Progress Notes   ? Consult   ? Procedure   ? H&P   ? Goals of Care   ? Interdisciplinary Notes     **All of Audiology visit notes (Reports) are considered ''Consult Notes''.     Please call [phone] for additional help if you still cannot see notes.         A copy of the audiogram/reports can be obtained by contacting Medical Records:    Phone: [phone] [facility] Medical Records    In person during the hours of 8:00 AM-4:30 PM, Monday-Friday    [facility]- [address], [address] [place], [place] [address]    [facility]- [address], [address], [place] [address]          Brayton Mars acceder a sus informes de Audiolog???a en l???nea:      El informe se puede ver en [facility] ''MyChart''. ?Se encuentra bajo notas cl???nicas.      Para acceder al informe:   1.  Pose el cursor [employer] ???icono de ???Visits.??? (consultas)  2.  Presione el enlace de ???Appointments and Visits??? (citas y consultas)  3.  Haga clic en la consulta deseada  4.  Haga clic en la pesta???a ''Clinical notes??? (notas cl???nicas), situado debajo del rubro ''Appontment  Details'' (detalles de la cita)  5.  A continuaci???n, se enumeran los tipos de notas ambulatorias que podr??? ver:   ? Progress Notes (notas evolutivas)  ? Consult (interconsulta)   ? Procedure (procedimiento)    ? H&P (historial m???dico y un examen f???sico)   ? Goals of Care (metas de tratamiento)   ? Interdisciplinary Notes (notas interdisciplinarias)    **Todas las notas de la consulta en Audiolog???a (Reports) se consideran ???Consult notes??? (notas de interconsulta)     Por favor llame al [phone] para obtener ayuda adicional si todav???a no puede ver las notas.        Una copia del audiograma/informes se pueden obtener al comunicarse con el archivo de historia cl???nica     Llame por Tel???fono al: [phone], [facility] Medical Records (archivo de historia cl???nica en [place])     Atenci???n disponible en persona durante las horas entre 8:00 AM-4:30 PM, de lunes a viernes     [facility]- [address], [address] [place], [place] [address]     [facility]- [address], [address], [place] [address]

## 2019-07-29 ENCOUNTER — Ambulatory Visit: Payer: MEDICARE | Attending: Geriatric Medicine

## 2019-07-29 DIAGNOSIS — R1012 Left upper quadrant pain: Secondary | ICD-10-CM

## 2019-07-29 DIAGNOSIS — N1831 Stage 3a chronic kidney disease: Secondary | ICD-10-CM

## 2019-07-29 DIAGNOSIS — G7000 Myasthenia gravis without (acute) exacerbation: Secondary | ICD-10-CM

## 2019-07-29 DIAGNOSIS — D7589 Other specified diseases of blood and blood-forming organs: Secondary | ICD-10-CM

## 2019-07-29 DIAGNOSIS — G7001 Myasthenia gravis with (acute) exacerbation: Secondary | ICD-10-CM

## 2019-07-29 DIAGNOSIS — N1832 Type 2 diabetes mellitus with stage 3b chronic kidney disease, without long-term current use of insulin (HCC/RAF): Secondary | ICD-10-CM

## 2019-07-29 DIAGNOSIS — C50911 Malignant neoplasm of unspecified site of right female breast: Secondary | ICD-10-CM

## 2019-07-29 DIAGNOSIS — E1121 Type 2 diabetes mellitus with diabetic nephropathy: Secondary | ICD-10-CM

## 2019-07-29 MED ORDER — DEXLANSOPRAZOLE 60 MG PO CPDR
60 mg | ORAL_CAPSULE | Freq: Every day | ORAL | 3 refills | Status: AC
Start: 2019-07-29 — End: ?

## 2019-07-29 MED ORDER — CLONAZEPAM 1 MG PO TBDP
1 mg | ORAL_TABLET | Freq: Every evening | ORAL | 1 refills | Status: AC
Start: 2019-07-29 — End: ?

## 2019-07-29 MED ORDER — NIFEDIPINE ER OSMOTIC RELEASE 30 MG PO TB24
30 mg | ORAL_TABLET | Freq: Every day | ORAL | 3 refills | Status: AC
Start: 2019-07-29 — End: ?

## 2019-07-29 MED ORDER — CLONAZEPAM 1 MG PO TABS
1 mg | ORAL_TABLET | Freq: Three times a day (TID) | ORAL | 1 refills | Status: AC
Start: 2019-07-29 — End: ?

## 2019-07-29 MED ORDER — TRIMETHOPRIM 100 MG PO TABS
100 mg | ORAL_TABLET | Freq: Every day | ORAL | 3 refills | Status: AC
Start: 2019-07-29 — End: ?

## 2019-07-29 MED ORDER — PYRIDOSTIGMINE BROMIDE 60 MG PO TABS
60 mg | ORAL_TABLET | Freq: Two times a day (BID) | ORAL | 1 refills | Status: AC
Start: 2019-07-29 — End: ?

## 2019-07-29 MED ORDER — LIDOCAINE 5 % EX PTCH
1 | MEDICATED_PATCH | Freq: Two times a day (BID) | TRANSDERMAL | 3 refills | 30.00000 days | Status: AC
Start: 2019-07-29 — End: ?

## 2019-07-29 MED ORDER — AZATHIOPRINE 50 MG PO TABS
75 mg | ORAL_TABLET | Freq: Two times a day (BID) | ORAL | 3 refills | Status: AC
Start: 2019-07-29 — End: ?

## 2019-07-29 MED ORDER — BUPROPION HCL ER (SR) 200 MG PO TB12
200 mg | ORAL_TABLET | Freq: Two times a day (BID) | ORAL | 3 refills | Status: AC
Start: 2019-07-29 — End: ?

## 2019-07-29 NOTE — Patient Instructions
Start taking nifedipine 30 mg in am for your blood pressure.

## 2019-07-29 NOTE — Progress Notes
OUTPATIENT GERIATRICS CLINIC NOTE    PATIENT:  [name]   MRN:  [id number]  DOB:  1935-08-16  DATE OF SERVICE:  07/29/2019  PRIMARY CARE PHYSICIAN: York Ram., MD    CHIEF COMPLAINT:   No chief complaint on file.      [place] Specialists:  [facility]    Non- Specialists:    Chaperone status:  No data recorded    HISTORY OF PRESENT ILLNESS     [name] is a 84 y.o. female who presents today for *follow up**. Patient is accompanied by *no one**. History today is per the patient,and review of available recent records in [facility] and Care Everywhere.     Patient is a(n) []  reliable []  unreliable historian and additional collateral information was obtained during the visit today from:     [place]--March 25--not ereviewd--  [facility]--audiology--May 23, 2019--note reviewed      Per chart review, pertinent medical history:  No past medical history on file.  No past surgical history on file.    ALLERGIES     Allergies   Allergen Reactions   ??? Beta Adrenergic Blockers Other (See Comments)     Avoid d/t Myasthenia Gravis  Avoid d/t Myasthenia Gravis     ??? Macrolides And Ketolides Other (See Comments)     Avoid d/t Myasthenia Gravis  Use with caution d/t Myasthenia Gravis  Use with caution d/t Myasthenia Gravis     ??? Sulfa Antibiotics Other (See Comments)     May possibly have caused deafness in one ear  May possibly have caused deafness in one ear  other     ??? Botulinum Toxins Other (See Comments)     Muscle weakness r/t Myasthenia Gravis  Muscle weakness r/t Myasthenia Gravis     ??? Statins Other (See Comments)     Muscle aches  Muscle aches  Muscle aches     ??? Plastic Tape Itching and Other (See Comments)     Turns the skin red where applied        MEDICATIONS     Personally reviewed.    No outpatient medications have been marked as taking for the 07/29/19 encounter (Appointment) with York Ram., MD.       SOCIAL HISTORY     Social History     Social History Narrative   ??? Not on file FUNCTIONAL STATUS     BADLs:  IADLs:    Ambulates with   Falls in past year:  Afraid of falling:    GERIATRIC REVIEW OF SYSTEMS     Vision:    []   glasses     []  legally blind  Hearing: []  hearing aids    Nutrition: []  normal   []  impaired  []  vegan  []  vegetarian  []  low salt  []  low-carb   Swallowing: []  impaired   Dentures:   []  yes    Depression:  []  yes   Cognition:     Incontinence: [] urine  []  fecal  []  urine and fecal      ADVANCED CARE PLANNING     Advanced directives on file: []  No  []   Yes ??? Completed:     Medical DPOA on file:   []   Yes ???   []   No ??? Patient designates ** * to be their surrogate medical decision-maker.         HEALTH CARE MAINTENANCE     IMMUNIZATIONS:   Immunization History   Administered Date(s)  Administered   ??? COVID-19, mRNA, LNP-S, PF, 30 mcg/0.3 mL Dose (Pfizer-BioNTech) 03/11/2019, 04/01/2019   ??? DTaP 12/31/2015   ??? influenza vaccine IM quadrivalent (Fluzone Quad) MDV (21 months of age and older) 11/26/2016   ??? influenza vaccine IM quadrivalent adjuvanted (FluAD Quad) (PF) SYR (67 years of age and older) 11/05/2018   ??? influenza, unspecified formulation 04/01/2019, 04/01/2019   ??? pneumococcal polysaccharide vaccine 23-valent (Pneumovax) 01/10/2019       Mammogram:  DXA:  PAP:  Colonoscopy:      PHYSICAL EXAM     BP 150/80 (BP Location: Left arm, Patient Position: Sitting, Cuff Size: Regular)  ~ Pulse 92  ~ Temp (!) 35.6 ???C (96 ???F) (Skin)  ~ Resp 16  ~ Wt 145 lb (65.8 kg)  ~ SpO2 97%  ~ BMI 24.89 kg/m???   Wt Readings from Last 3 Encounters:   07/14/19 145 lb 8 oz (66 kg)   06/03/19 141 lb (64 kg)   05/07/19 138 lb (62.6 kg)         System Check if normal Positive or additional negative findings   GEN  [x]  NAD     Eyes  []  Conj/Lids []  Pupils  []  Fundi   []  Sclerae []  EOM     ENT  []  External ears   []  Otoscopy   []  Gross Hearing    []   External nose   []  Nasal mucosa   []  Lips/teeth/gums    []  Oropharynx    []  Mucus membranes      Neck  []  Inspection/palpation    []  Thyroid     Resp []  Effort    []  Auscultation       CV  []  Rhythm/rate   []  Murmurs   []  Edema   []  JVP non-elevated    Normal pulses:   []  Radial []  Femoral  []  Pedal     Breast  []  Inspection []  Palpation     GI  []  Bowel sounds    []  Nontender   []  No distension    []  No rebound or guarding   []  No masses   []  Liver/spleen    []  Rectal     GU  F:  []  External []  vaginal wall         []  Cervix     []  mucus        []  Uterus    []  Adnexa   M:  []  Scrotum []  Penis         []  Prostate     Lymph  []  Cervical []  supraclavicular     []  Axillae   []  Groin/inguinal     MSK []  Gait  []  Back     Specify site examined:    []  Inspect/palp []  ROM   []  Stability []  Strength/tone []  Used arms to push up from seated to standing position   Assistive device:  []  single point cane []  quad cane  []  FWW []  rollator walker      Skin  []  Inspection []  Palpation     Neuro  []  Alert and oriented     []  CN2-12 intact grossly   []  DTR      []  Muscle strength      []  Sensation   []  Pronator drift   []  Finger to Nose/Heel to Shin   []  Romberg     Psych  []  Insight/judgement     []  Mood/affect    []  Gross cognition  Patient had brief emotional/behavioral testing with standardized instruments as follows for the purpose of screening, case finding or evaluating treatment.  Results below:    GAD-7  PHQ-9 15    LABS/STUDIES     LABS:  Lab Results   Component Value Date    WBC 4.35 07/14/2019    WBC 4.40 04/14/2019    WBC 8.75 12/17/2018    HGB 12.7 07/14/2019    HGB 13.0 04/14/2019    HGB 12.2 12/17/2018    MCV 97.9 07/14/2019    PLT 323 07/14/2019    PLT 314 04/14/2019    PLT 420 (H) 12/17/2018     Lab Results   Component Value Date    NA 140 04/14/2019    NA 141 12/17/2018    K 4.5 04/14/2019    K 4.1 12/17/2018    CREAT 1.05 04/14/2019    CREAT 1.13 12/17/2018    GFRESTNOAA 50 04/14/2019    GFRESTNOAA 46 12/17/2018    GFRESTAA 58 04/14/2019    GFRESTAA 53 12/17/2018    CALCIUM 9.4 04/14/2019    CALCIUM 9.8 12/17/2018     Lab Results   Component Value Date ALT 14 04/14/2019    ALT 11 12/17/2018    AST 21 04/14/2019    AST 20 12/17/2018    ALKPHOS 91 04/14/2019    BILITOT 0.2 04/14/2019    ALBUMIN 3.9 04/14/2019    ALBUMIN 4.3 12/17/2018     Lab Results   Component Value Date    TSH 1.00 12/17/2018     No results found for: HGBA1C  No results found for: CHOL, CHOLDLCAL  No results found for: CHOLDLQ  No results found for: [facility]  Lab Results   Component Value Date    FE 87 07/14/2019    FERRITIN 57 07/14/2019    TIBC 356 07/14/2019     No results found for: VITAMINB12, RPR  No results found for: BNP  No results found for: PSATOTAL      STUDIES:      ASSESSMENT and PLAN     Ruchama Kubicek is a 84 y.o. female who presents today for *follow up**.      #HTN--trial nifedipine CR 30 mg daily.  #Macrocytosis--check B12, folic acid at some point  #Deconditioning--initiate home PT  #GERD--s/p Nisan fundoplication  #COPD--mixed--per PFT's from [name].--consider reinitiation of inhalers.  #s/p rectocele repair  #s/p cholecystectomy  #Nocturnal hypoxemia--presumably related to MG crisis but has history of COPD. ???Will order nocturnal home O2 study and consider PFT's/Pulmonary evaluation once settled down. ???May need to restart BREO inhaler.  #Myasthenia Gravis--s/p plasmapheresis x5 October 2020, no Thymoma, on Azathioprine 150 daily and mestinon 60 tid.??????Saw???Dr. Enedina Finner at [facility]. ???Was considering Soliris.??????Ordering IVIG and MRI  #IBS--titrate miralax to comfort. ???Consider duloxetine substitute for buproprion.  #Interstitial cystitis--on daily Trimethoprim 100 for UTI prophylaxis. ???Using bladder instillations prn. OFF topical estrogen due to advice from neurologist re: MG.  To see Dr. Desma Maxim to see if she has additional insights.  #Major Depression--active. ???Likely to add or substitute duloxetine next.  Referred patient to Dr. Randie Heinz.  #Allergic rhinitis--previously on nasal steroids.  #Breast CA--DCIS???by report--obtain mammo. ???Patient declined surgery in 2019 and experienced dry mouth from Tamoxifen. ???Was considering Anastrozole. Await records from prior mammogram and treatment.  Per records from [facility]:   ''07/20/2017 Screening detected right breast calcifications and distortion. The distortion was in the upper outer quadrant: Biopsy DCIS intermediate grade with CSL; calcifications UIQ: 2.8 cm: Biopsy  DCIS+ ALH +CSL; 4 cm apart, axilla negative, Tis NX stage 0''  #DM with CKD--stabe 3b--based on labs from [name]. ???FOllow here. ???If BP permits, may consider ARB BUT patient with h/o angioedema so will ONLY initiate this agent with great caution.  Refer to Ophthalmology.  #OA knees--previously getting hyaluronic acid injections.  #Anxiety  #Benzodiazepine dependence--will work to wean over time.  #Fibromyalgia--duloxetine as above. 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  #Polypharmacy  #Statin intolerance      FOLLOW-UP     RTC     Future Appointments   Date Time Provider Department Center   07/29/2019 11:20 AM York Ram., MD GERI IMS 420 MEDICINE   07/31/2019  1:30 PM Trenda Moots., MD [facility] [phone] MEDICINE   08/19/2019  4:30 PM Alvina Chou, AuD MP2 AUD AUDIOLOGY/SP   08/21/2019  3:00 PM Trenda Moots., MD [facility] [phone] MEDICINE   10/20/2019 10:30 AM [facility] B200 NEUROLOGY   05/03/2020  1:30 PM Shoku, Bita, OD DS VIS REHAB OPH         The above plan of care, diagnosis, orders, and follow-up were discussed with the patient and/or surrogate. Questions related to this recommended plan of care were answered.    ANALYSIS OF DATA (Needs to meet 1 category for moderate and 2 categories for high LOS)     I have:     Category 1 (Needs 3 for moderate and high LOS)     []  Reviewed []  1 []  2 []  ? 3 unique laboratory, radiology, and/or diagnostic tests noted below    Test/Study:  on date .    []  Reviewed []  1 []  2 []  ? 3 prior external notes and incorporated into patient assessment    I reviewed Dr. 's note in specialty  from date . []  Discussed management or test interpretation with external provider(s) as noted      []  Ordered []  1 []  2 []  ? 3 unique laboratory, radiology, and/or diagnostic tests noted in A&P    []  Obtained history from independent historian:       Category 2  []  Independently interpreted the test    Category 3  []  Discussed management or test interpretation with external provider(s) as noted      INTERACTION COMPLEXITY and SOCIAL DETERMINANTS of HEALTH       PROBLEM COMPLEXITY   []  New problem with uncertain diagnosis or prognosis (moderate)   []  Multiple stable chronic problems (moderate)   []  Chronic problem not stable - not controlled, symptomatic, or worsening (moderate)   []  Severe exacerbation of chronic problem (high)   []  New or chronic problem that poses threat to life or bodily function (high)     MANAGEMENT COMPLEXITY   []  Old or external/outside records reviewed   []  Discussion with alternate (proxy) if patient with impaired communication / comprehension ability (e.g., dementia, aphasia, severe hearing loss).   []  Repeated questions (or disagreement) between patient and/among caregivers/family during the visit.  []  Caregiver/patient emotions/behavior/beliefs interfering with implementation of treatment plan.  []  Independent interpretation of test (EKG, Chest XRay)   []  Discussion of case with a consultant physician     RISK LEVEL   []  Prescription drug management (moderate)   []  Minor surgery with CV risk factors or elective major surgery (moderate)   []  Dx or Rx significantly limited by SDoH (inadequate housing, living alone, poor health care access, inappropriate diet; low literacy) (moderate)   []   Major surgery - elective with CV risk factors or emergent (high)   []  Need for hospitalization (high)   []  New DNR or de-escalation of care (high)      SDoH  The diagnosis or treatment of said conditions is significantly limited by the following social determinants of health:  []  Z59.0 Homelessness  []  Z59.1 Inadequate housing  []  Z59.2 Discord with neighbors, lodgers and landlord  []  Z60.2 Problems related to living alone  []  Z59.8 Other problems related to housing and economic circumstances  []  Z59.4 Lack of adequate food and safe drinking water  []  Z59.6 Low income  []  Z59.7 Insufficient social insurance and welfare support  []  Z59.9 Problems related to housing and economic circumstances, unspecified  []  Z75.3 Unavailability and inaccessibility of health care facilities  []  Z75.4 Unavailability and inaccessibility of other helping agencies  []  Z72.4 Inappropriate diet and eating habits  []  Z62.820 Parent-biological child conflict  []  Z63.8 Other specified problems related to primary support group  []  Z55.0 Illiteracy and low level literacy   []  Z56.9 Unspecified problems related to employment        If Billing Based on Time:     I performed the following items on the day of service:    [x]  Preparing to see the patient (e.g., review of tests)  [x]  Obtaining and/or reviewing separately obtained history   [x]  Performing a medically appropriate examination and/or evaluation   [x]  Counseling and educating the patient/family/caregiver   [x]  Ordering medications, tests, or procedures  [x]  Referring and communicating with other healthcare professionals (when not separately reported)  [x]  Documenting clinical information in the EHR  []  Independently interpreting results and communicating results to patient/family/caregiver    I spent the following total amount of time on these tasks on the day of service:  New Patient     Established Patient  []  15-29 minutes ??? [id number]    []  up to 9 minutes ??? [id number]  []  30-44 minutes ??? [id number]     []  10-19 minutes ??? [id number]   []  45-59 minutes ??? [id number]      []  20-29 minutes ??? [id number]   []  60-74 minutes ??? [id number]   []  30-39 minutes ??? [id number]         [x]  40-55 minutes ??? [id number]    []  I spent an additional ** * 15-minute-increment(s) for a total of * ** minutes on these tasks on the day of service. [phone] for each additional 15 minutes.)    Author: York Ram, MD 07/29/2019

## 2019-07-31 ENCOUNTER — Ambulatory Visit: Payer: BLUE CROSS/BLUE SHIELD

## 2019-07-31 DIAGNOSIS — M542 Cervicalgia: Secondary | ICD-10-CM

## 2019-07-31 DIAGNOSIS — G7000 Myasthenia gravis without (acute) exacerbation: Secondary | ICD-10-CM

## 2019-07-31 DIAGNOSIS — S161XXS Strain of muscle, fascia and tendon at neck level, sequela: Secondary | ICD-10-CM

## 2019-07-31 DIAGNOSIS — M25562 Pain in left knee: Secondary | ICD-10-CM

## 2019-07-31 DIAGNOSIS — M26629 Arthralgia of temporomandibular joint, unspecified side: Secondary | ICD-10-CM

## 2019-07-31 DIAGNOSIS — M7918 Myalgia, other site: Secondary | ICD-10-CM

## 2019-07-31 DIAGNOSIS — G8929 Other chronic pain: Secondary | ICD-10-CM

## 2019-07-31 NOTE — Progress Notes
[facility] Progress Note     PATIENT: [name]  MRN: [id number]  DOB: April 14, 1935  DATE OF SERVICE: 07/31/2019  REFERRING PRACTITIONER: No ref. provider found  PRIMARY CARE PROVIDER: York Ram., MD    Chief Complaint   Patient presents with   ??? Neck Pain     Subjective:      History of Present Illness:  [name] is a 84 y.o. y.o. female with PMH of  has no past medical history on file. who presents for follow-up cervical and lumbar spondylosis.     Initial Consult 04/16/19  # Chronic neck and shoulder pain  - present for many years, gradual onset, no inciting event or trauma. Underlying myasthenia gravis.   - Prior work-up: MR cervical spine with multilevel degenerative disc disease in setting of C4-C7 congenital cervical spinal canal stenosis, neural foraminal narrowing, facet joint degeneration   - Interventions: no medications for pain; was going to start PT but didn't go due to pandemic  - Self-care: cervical collar sometimes if needed to bend down  - Timing/Duration: constant, associated with activities   - Location/Radiation: bilateral neck, no radiation  - Quality: aching, sharp at times  - Severity: varies between 5 to 10 out of 10  - Modifiers: worse with activity. better with rest.   - Associated s/s: diffuse generalized weakness of bilateral lower extremities (thinks its related to myasthenia gravis), leg pain  - Denies: numbness, tingling, upper extremity weakness, changes in bowel or bladder, changes in gait  ???  # Myasthenia gravis  - hospitalized 11/2018 for exacerbation, receiving IVIG infusions regularly  - uses a walker / cane sometimes  ???  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 Husband died 2 years ago of CHF.   BM: intermittent constipation, diarrhea   Temperature: tends to run cold  Supplements: none  __________________________________________________    Follow-up 05/21/19  - feels the treatments last visit did help reduce her pain temporarily   - still with persistent neck pain and left knee weakness  - got neck and shoulder massager and using it at home  - purchased TENS unit but forgot to bring it today  - [place]    Follow-up 06/12/19  - treatments help temporarily with the neck pain  - using shiatsu neck massager  - has TENS unit, wondering where to place     Follow-up 07/03/19  - neck pain and left knee pain persists   - overall doesn't feel well, wondering if stopping her mestinon contributed, had upcoming neurology appointment  - brought TENS unit in today, was able to get it to work, but wondered where she should put the pads    Follow-up 07/17/19  - neck pain persistent, but treatments help temporarily  - saw neuromuscular, ordered sleep study, had IVIG yesterday with improvement of symptoms   - using TENS unit for neck and heating pad, with some help    Follow-up 07/31/19  - neck and shoulders doing ok  - left lateral thigh with increased myofascial pain, as well as left knee has been bothering her more    Review of Systems:  A 14 point review of systems was completed on intake and fully  reviewed with patient.  Pertinent areas discussed and documented in HPI or noted below.     Medications that the patient states to be currently taking   Medication Sig   ??? Albuterol Sulfate AEPB Inhale Takes 1 to 2 puffs every 4 hours as needed for wheezing or SOB .   ??? azaTHIOprine 50 mg tablet Take 1.5 tablets (75 mg total) by mouth two (2) times daily.   ??? buPROPion, SR, (WELLBUTRIN SR) 200 mg 12 hr tablet Take 1 tablet (200 mg total) by mouth two (2) times daily.   ??? calcium carbonate 1250 (500 Ca) MG chewable tablet Chew by mouth.   ??? clonazePAM 1 mg disintegrating tablet Take 1 tablet (1 mg total) by mouth at bedtime. Max Daily Amount: 1 mg   ??? clonazePAM 1 mg tablet Take 1 tablet (1 mg total) by mouth three (3) times daily. Max Daily Amount: 3 mg   ??? dexlansoprazole 60 mg DR capsule Take 1 capsule (60 mg total) by mouth daily.   ??? GAMMAGARD 30 GM/300ML SOLN every twenty one (21) days .   ??? lidocaine 5% patch Place 1 patch onto the skin every twelve (12) hours 12 hours on 12 hours off.Marland Kitchen   ??? lidocaine-prilocaine 2.5-2.5% cream two (2) times daily as needed .   ??? montelukast 10 mg tablet Take 10 mg by mouth as needed for.   ??? NIFEdipine (PROCARDIA XL) 30 mg 24 hr tablet Take 1 tablet (30 mg total) by mouth daily.   ??? phenazopyridine 200 mg tablet Take 1 tablet (200 mg total) by mouth three (3) times daily with meals.   ??? pyridostigmine 60 mg tablet Take 1 tablet (60 mg total) by mouth two (2) times daily.   ??? trimethoprim 100 mg tablet Take 1 tablet (100 mg total) by mouth daily.     Allergies   Allergen Reactions   ??? Beta Adrenergic Blockers Other (See Comments)     Avoid d/t Myasthenia Gravis  Avoid d/t Myasthenia Gravis     ??? Macrolides And Ketolides Other (See Comments)     Avoid d/t Myasthenia Gravis  Use with caution d/t Myasthenia Gravis  Use with caution d/t Myasthenia Gravis     ??? Sulfa Antibiotics Other (See Comments)     May possibly have caused deafness in one ear  May possibly have caused deafness in one ear  other     ??? Botulinum Toxins Other (See Comments)     Muscle weakness r/t Myasthenia Gravis  Muscle weakness r/t Myasthenia Gravis     ??? Statins Other (See Comments)     Muscle aches  Muscle aches  Muscle aches     ??? Plastic Tape Itching and Other (See Comments)     Turns the skin red where applied     Patient Active Problem List    Diagnosis Date Noted   ??? Refractive error 04/28/2019   ??? Ptosis of eyelid 04/28/2019   ??? Pseudophakia of both eyes 04/28/2019   ??? Benzodiazepine dependence (HCC/RAF) 12/20/2018   ??? Breast cancer (HCC/RAF) 12/20/2018   ??? Spondylosis without myelopathy or radiculopathy, lumbar region 11/28/2018   ??? Anxiety 11/27/2018   ??? CKD (chronic kidney disease), stage III 11/27/2018   ??? Nocturnal hypoxemia 04/07/2018        ??? Major depression, recurrent, chronic (HCC/RAF) 12/16/2017   ??? Diabetes mellitus (HCC/RAF) 12/16/2017   ??? Carcinoma in situ of breast 07/24/2017        ??? Osteoarthritis of knee  05/17/2017   ??? H/O arthroscopy 03/06/2017   ??? Myasthenia gravis with exacerbation (HCC/RAF) 12/18/2016   ??? Vaginal atrophy 08/16/2016   ??? Acute medial meniscal tear, left, subsequent encounter 07/18/2016   ??? Renal cyst 03/12/2016   ??? Asthmatic bronchitis, mild persistent, with acute exacerbation 12/29/2015        ??? Urethral caruncle 07/26/2015   ??? Chronic cholecystitis 01/14/2015   ??? Angioedema 05/23/2014        ??? Chronic interstitial cystitis 10/01/2011   ??? Insomnia 05/31/2007        ??? COPD mixed type (HCC/RAF) 04/30/2007        ??? Esophageal reflux 04/30/2007        ??? Eustachian tube dysfunction 04/30/2007        ??? Fibromyalgia 04/30/2007        ??? Seasonal and perennial allergic rhinitis 04/30/2007          Objective:   BP 169/78  ~ Pulse 83  ~ Temp 37 ???C (98.6 ???F) (Oral)     Physical Exam:  General: alert, appears stated age and cooperative  Eyes: conjunctiva and lids normal  ENMT: normal external nose and ears.  normal oropharynx  Lungs:normal respiratory effort.  no cyanosis.  Extremities:extremities normal, atraumatic, no cyanosis or edema  Skin: warm and dry, no rashes.  Psych: alert and oriented to person, place and time  MSK: tenderness of below muscle groups     Trigger/ Tender Points Identification:         Assessment and Plan   Jahdai Shein is a 84 y.o. y.o. female who presents with   1. Cervicalgia  Acupuncture    INJECT TRIGGER POINTS, > 3   2. Cervical myofascial strain, sequela  Acupuncture    INJECT TRIGGER POINTS, > 3   3. Myofascial pain  Acupuncture    INJECT TRIGGER POINTS, > 3   4. Myasthenia gravis without (acute) exacerbation (HCC/RAF)     5. TMJ syndrome     6. Chronic pain of left knee  Acupuncture     # Chronic neck pain  # Cervical myofascial pain  Temporary improvement with treatments, overall stable but persistent. Patient with chronic neck pain for years, underlying myasthenia gravis, and MRI with multilevel degenerative changes and neural foraminal narrowing. Exam reveals multiple trigger points suggesting significant myofascial component to pain.   - repeat acupuncture today  - repeat trigger point injections today   - continue TENS unit use  - continue neck and heat shiatsu massage device   - gentle neck stretching    # Left lateral thigh strain  - acupuncture and TPIs done today    # Chronic left knee pain  - acupuncture done today   ???  # Myasthenia gravis   - Qi tonic foods   - can try American ginseng tea, recommended [address] [place]    Treatments Provided Today:     Acupuncture point interventions:    Sp-6/St-36, LI-4/Lv-3 LI-10 Sp-10, Sp-9           Xiyan, Heding     Acupuncture Treatment Duration: 20 Minutes  Injection of : Trigger Points with 0.74mL 1% lidocaine      Trapezius Levator scapulae, Rhomboids, Infraspinatus           Iliotibial band    Trigger Point Injection Procedure Note   Procedure: Trigger point injection of Trapezius Levator scapulae, Rhomboids, Infraspinatus           Iliotibial band  Indication: Myofascial  pain; pain or altered sensation in the expected distribution of referred pain from a trigger point; taut band palpable in an accessible muscle with exquisite tenderness at one point along the length of it; some degree of restricted motion when measurable; reproduction of pain by stimulating trigger point  Consent: The risks (infection, bleeding, pain) and benefits of trigger points injections were explained to the patient and alternatives were discussed. All questions were answered to the patient's satisfaction. The patient verbalized understanding and consented to the trigger point injection(s).  Sterilization: The target area of the active trigger point(s) was sterilized using a 70% isopropyl alcohol pad.   Procedure: Manual palpation was used to identify the target area. Using a 1-inch 23 gauge and/or 0.5-inch 25 gauge needle, the following procedure was completed:   Injection of : Trigger Points with 0.79mL 1% lidocaine   into Trapezius Levator scapulae, Rhomboids, Infraspinatus           Iliotibial band  The needle was infiltrated 1-3 cm in a fanwise method into each trigger point location. Manual pressure was applied after the procedure. There was no significant blood loss. The patient tolerated the procedure well.    Follow-up     Return in about 3 weeks (around 08/21/2019) for Blake Divine / K Hui or A Chu.    Future Appointments   Date Time Provider Department Center   08/19/2019  4:30 PM Alvina Chou, AuD MP2 AUD AUDIOLOGY/SP   08/21/2019  3:00 PM Trenda Moots., MD [facility] [id number] MEDICINE   09/09/2019 10:20 AM York Ram., MD GERI IMS 420 MEDICINE   09/11/2019  3:30 PM Trenda Moots., MD [facility] [id number] MEDICINE   10/20/2019 10:30 AM [facility] NEUMUSC B200 NEUROLOGY   05/03/2020  1:30 PM Shoku, Drue Flirt, OD DS VIS REHAB OPH       Seen and discussed with Dr. Kathalene Frames    Author: Will Bonnet, MD, 07/31/2019, 3:11 PM   [facility] Fellow and Clinical Instructor    Patient was seen and examined with the fellow Dr. Will Bonnet. I agree with the findings/assessment/plan we formulated together as documented above.    Kathalene Frames MD MPH  [facility]  [facility]  Pgr [phone]  07/31/2019 3:11 PM

## 2019-08-02 ENCOUNTER — Ambulatory Visit: Payer: BLUE CROSS/BLUE SHIELD

## 2019-08-04 ENCOUNTER — Telehealth: Payer: BLUE CROSS/BLUE SHIELD

## 2019-08-04 NOTE — Telephone Encounter
Elena from [employer] called in following up on the status on the order with the frequency change for the patients IVIG. Good CB# [phone] Fax# [phone] Jiles Garter states she faxed the request over on 07/30/2019

## 2019-08-06 ENCOUNTER — Ambulatory Visit: Payer: BLUE CROSS/BLUE SHIELD

## 2019-08-06 NOTE — Telephone Encounter
Form received and faxed back.

## 2019-08-08 ENCOUNTER — Telehealth: Payer: MEDICARE

## 2019-08-08 NOTE — Telephone Encounter
Hi [name],  Please assist her in getting apt to see a psychologist.  She is quite depressed and would benefit from talk therapy.

## 2019-08-10 NOTE — Telephone Encounter
Reply by: [name]        Good afternoon Dr. Mindi Slicker,    I just spoke with the psych dept who confirmed the referral had been rec'd.  Was advised referral has to be triaged and they would contact patient in about 3-4 days.     [name]

## 2019-08-11 ENCOUNTER — Ambulatory Visit: Payer: MEDICARE

## 2019-08-11 MED ORDER — ALBUTEROL SULFATE 108 (90 BASE) MCG/ACT IN AEPB
1 refills | Status: AC
Start: 2019-08-11 — End: 2019-08-14

## 2019-08-11 NOTE — Telephone Encounter
Rx sent 

## 2019-08-12 ENCOUNTER — Telehealth: Payer: BLUE CROSS/BLUE SHIELD

## 2019-08-12 NOTE — Telephone Encounter
Call Back Request      Reason for call back:   Per Marcie Bal at [facility] they need clarification on the dosage form of Albuterol.   Is it Powder, HFA , etc?     Call back Ph: [phone]    Any Symptoms:  []  Yes  [x]  No      ? If yes, what symptoms are you experiencing:    o Duration of symptoms (how long):    o Have you taken medication for symptoms (OTC or Rx):      Patient or caller has been notified of the 24-48 hour turnaround time.

## 2019-08-12 NOTE — Telephone Encounter
I called to clarify that the original rx was for AEPB, which implies powder, but told her that I am fine with whatever formulation the pt prefers.

## 2019-08-14 ENCOUNTER — Telehealth: Payer: BLUE CROSS/BLUE SHIELD

## 2019-08-14 MED ORDER — ALBUTEROL SULFATE 108 (90 BASE) MCG/ACT IN AEPB
1 refills | 25.00000 days | Status: AC
Start: 2019-08-14 — End: 2019-08-18

## 2019-08-14 NOTE — Telephone Encounter
Medication Verification      Medication: Albuterol Sulfate AEPB    Caller would like to verify the following (Please check all that applies):    []  Dosage    []  Quantity      [x]  Directions     Additional information?  Verifying frequency  Patient or caller has been notified of the 24-48 hour turnaround time.

## 2019-08-14 NOTE — Telephone Encounter
Albuterol Rx has been cancelled at [facility] and sent to [facility] for filling. Confirmed with both pharmacies already. Phoned [facility] and clarified directions. Phoned patient and made her aware that [facility] will now be filling the Rx. Patient was thankful.

## 2019-08-18 ENCOUNTER — Ambulatory Visit: Payer: BLUE CROSS/BLUE SHIELD

## 2019-08-18 MED ORDER — ALBUTEROL SULFATE 108 (90 BASE) MCG/ACT IN AEPB
1 refills | Status: AC
Start: 2019-08-18 — End: ?

## 2019-08-19 ENCOUNTER — Ambulatory Visit: Payer: MEDICARE

## 2019-08-21 ENCOUNTER — Ambulatory Visit: Payer: MEDICARE

## 2019-08-21 DIAGNOSIS — M7918 Myalgia, other site: Secondary | ICD-10-CM

## 2019-08-21 DIAGNOSIS — M542 Cervicalgia: Secondary | ICD-10-CM

## 2019-08-21 DIAGNOSIS — M26629 Arthralgia of temporomandibular joint, unspecified side: Secondary | ICD-10-CM

## 2019-08-21 DIAGNOSIS — G8929 Other chronic pain: Secondary | ICD-10-CM

## 2019-08-21 DIAGNOSIS — S161XXS Strain of muscle, fascia and tendon at neck level, sequela: Secondary | ICD-10-CM

## 2019-08-21 DIAGNOSIS — M25562 Pain in left knee: Secondary | ICD-10-CM

## 2019-08-21 NOTE — Progress Notes
[facility] Progress Note     PATIENT: [name]  MRN: [id number]  DOB: [birth year]  DATE OF SERVICE: 08/21/2019  REFERRING PRACTITIONER: No ref. provider found  PRIMARY CARE PROVIDER: York Ram., MD    Chief Complaint   Patient presents with   ??? Muscle Pain     Subjective:      History of Present Illness:  Elverda Pry is a 84 y.o. female with PMH of  has no past medical history on file. who presents for follow-up cervical and lumbar spondylosis.     Initial Consult 04/16/19  # Chronic neck and shoulder pain  - present for many years, gradual onset, no inciting event or trauma. Underlying myasthenia gravis.   - Prior work-up: MR cervical spine with multilevel degenerative disc disease in setting of C4-C7 congenital cervical spinal canal stenosis, neural foraminal narrowing, facet joint degeneration   - Interventions: no medications for pain; was going to start PT but didn't go due to pandemic  - Self-care: cervical collar sometimes if needed to bend down  - Timing/Duration: constant, associated with activities   - Location/Radiation: bilateral neck, no radiation  - Quality: aching, sharp at times  - Severity: varies between 5 to 10 out of 10  - Modifiers: worse with activity. better with rest.   - Associated s/s: diffuse generalized weakness of bilateral lower extremities (thinks its related to myasthenia gravis), leg pain  - Denies: numbness, tingling, upper extremity weakness, changes in bowel or bladder, changes in gait  ???  # Myasthenia gravis  - hospitalized 11/2018 for exacerbation, receiving IVIG infusions regularly  - uses a walker / cane sometimes  ???  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 Husband died 2 years ago of CHF.   BM: intermittent constipation, diarrhea   Temperature: tends to run cold  Supplements: none  __________________________________________________    Follow-up 05/21/19  - feels the treatments last visit did help reduce her pain temporarily   - still with persistent neck pain and left knee weakness  - got neck and shoulder massager and using it at home  - purchased TENS unit but forgot to bring it today  - [place]    Follow-up 06/12/19  - treatments help temporarily with the neck pain  - using shiatsu neck massager  - has TENS unit, wondering where to place     Follow-up 07/03/19  - neck pain and left knee pain persists   - overall doesn't feel well, wondering if stopping her mestinon contributed, had upcoming neurology appointment  - brought TENS unit in today, was able to get it to work, but wondered where she should put the pads    Follow-up 07/17/19  - neck pain persistent, but treatments help temporarily  - saw neuromuscular, ordered sleep study, had IVIG yesterday with improvement of symptoms   - using TENS unit for neck and heating pad, with some help    Follow-up 07/31/19  - neck and shoulders doing ok  - left lateral thigh with increased myofascial pain, as well as left knee has been bothering her more    Follow-up 08/21/19  - IVIG helping, felt the best in a while this past Thursday   -  jaw tightness acting up, biting mouth, has TMJ syndrome   - left IT band still with pain, neck and shoulder doing better, treatments helping        Medications that the patient states to be currently taking   Medication Sig   ??? Albuterol Sulfate AEPB Takes 1 to 2 puffs every 4 hours as needed for wheezing or SOB Inhale Takes 1 to 2 puffs every 4 hours as needed for wheezing or SOB .Marland Kitchen   ??? azaTHIOprine 50 mg tablet Take 1.5 tablets (75 mg total) by mouth two (2) times daily.   ??? buPROPion, SR, (WELLBUTRIN SR) 200 mg 12 hr tablet Take 1 tablet (200 mg total) by mouth two (2) times daily.   ??? clonazePAM 1 mg disintegrating tablet Take 1 tablet (1 mg total) by mouth at bedtime. Max Daily Amount: 1 mg   ??? clonazePAM 1 mg tablet Take 1 tablet (1 mg total) by mouth three (3) times daily. Max Daily Amount: 3 mg   ??? dexlansoprazole 60 mg DR capsule Take 1 capsule (60 mg total) by mouth daily.   ??? GAMMAGARD 30 GM/300ML SOLN every twenty one (21) days .   ??? lidocaine 5% patch Place 1 patch onto the skin every twelve (12) hours 12 hours on 12 hours off.Marland Kitchen   ??? montelukast 10 mg tablet Take 10 mg by mouth as needed for.   ??? NIFEdipine (PROCARDIA XL) 30 mg 24 hr tablet Take 1 tablet (30 mg total) by mouth daily.   ??? pyridostigmine 60 mg tablet Take 1 tablet (60 mg total) by mouth two (2) times daily.   ??? trimethoprim 100 mg tablet Take 1 tablet (100 mg total) by mouth daily.     Allergies   Allergen Reactions   ??? Beta Adrenergic Blockers Other (See Comments)     Avoid d/t Myasthenia Gravis  Avoid d/t Myasthenia Gravis     ??? Macrolides And Ketolides Other (See Comments)     Avoid d/t Myasthenia Gravis  Use with caution d/t Myasthenia Gravis  Use with caution d/t Myasthenia Gravis     ??? Sulfa Antibiotics Other (See Comments)     May possibly have caused deafness in one ear  May possibly have caused deafness in one ear  other     ??? Botulinum Toxins Other (See Comments)     Muscle weakness r/t Myasthenia Gravis  Muscle weakness r/t Myasthenia Gravis     ??? Statins Other (See Comments)     Muscle aches  Muscle aches  Muscle aches     ??? Plastic Tape Itching and Other (See Comments)     Turns the skin red where applied     Patient Active Problem List    Diagnosis Date Noted   ??? Refractive error 04/28/2019   ??? Ptosis of eyelid 04/28/2019   ??? Pseudophakia of both eyes 04/28/2019   ??? Benzodiazepine dependence (HCC/RAF) 12/20/2018   ??? Breast cancer (HCC/RAF) 12/20/2018   ??? Spondylosis without myelopathy or radiculopathy, lumbar region 11/28/2018   ??? Anxiety 11/27/2018   ??? CKD (chronic kidney disease), stage III 11/27/2018   ??? Nocturnal hypoxemia 04/07/2018     Last Assessment & Plan:   We discussed potential impact of hypoxemia from her COPD on other body function  Plan-overnight oximetry     ??? Major depression, recurrent, chronic (HCC/RAF) 12/16/2017   ??? Diabetes mellitus (HCC/RAF) 12/16/2017   ??? Carcinoma in situ of breast 07/24/2017     Last Assessment & Plan:  07/19/2017???Screening detected right breast calcifications and distortion. ???The distortion was in the upper outer quadrant: Biopsy DCIS intermediate grade with CSL; calcifications UIQ: 2.8 cm: Biopsy DCIS+ ALH +CSL; 4 cm apart, axilla negative, Tis NX stage 0  ???  Significant delay in decision making because of psychosocial issues and myasthenia gravis.  Patient refused surgery and went on Tamoxifen 10 mg daily for 1 month and discontinued it.  Started anastrozole 01/30/1999 19/2 a tablet daily    Anastrozole toxicities:    Breast cancer surveillance: Mammogram 08/15/2018: Biopsy-proven sites of DCIS and ALH are stable, breast density category C    Continue every 68-month mammograms and follow-up.     ??? Osteoarthritis of knee 05/17/2017   ??? H/O arthroscopy 03/06/2017   ??? Myasthenia gravis with exacerbation (HCC/RAF) 12/18/2016   ??? Vaginal atrophy 08/16/2016   ??? Acute medial meniscal tear, left, subsequent encounter 07/18/2016   ??? Renal cyst 03/12/2016   ??? Asthmatic bronchitis, mild persistent, with acute exacerbation 12/29/2015     Last Assessment & Plan:   Continue rescue inhaler as needed. Try adding sample Bevespi maintenance inhaler to see if this helps.  She is currently on prednisone 20 mg daily maintenance and I don't think adding a steroid inhaler component will make any difference.     ??? Urethral caruncle 07/26/2015   ??? Chronic cholecystitis 01/14/2015   ??? Angioedema 05/23/2014     Attributed to injected methylprednisolone    Last Assessment & Plan:   We discussed limited ability to test for possible triggers. We are asking our labs sources about ability to test against methylprednisolone for injection.  A different brand might be worth trying. She can try pre-medicating with an antihistamine.     ??? Chronic interstitial cystitis 10/01/2011   ??? Insomnia 05/31/2007     Qualifier: Diagnosis of   By: Maple Hudson MD, Clinton D    Last Assessment & Plan:   I'm concerned that some of her sleep problems may reflect obstructive sleep apnea based on her palate and which she can tell me about her sleep patterns without a witness. We also want to know about oxygenation during sleep.  Plan-schedule sleep study  Qualifier: Diagnosis of   By: Maple Hudson MD, Clinton D      Last Assessment & Plan:   I'm concerned that some of her sleep problems may reflect obstructive sleep apnea based on her palate and which she can tell me about her sleep patterns without a witness. We also want to know about oxygenation during sleep.  Plan-schedule sleep study     ??? COPD mixed type (HCC/RAF) 04/30/2007     Office Spirometry 03/16/2016-severe obstructive airways disease FVC 1.59/59%, FEV1 0.98/49%, ratio 0.62, FEF 25???75% 0.42/28%  PFT 04/16/16-severe obstructive airways disease with slight response to bronchodilator, severe diffusion defect. FEV1/FVC 0.64, DLCO 45%    Last Assessment & Plan:   Severe obstructive airways disease. She can't hear herself wheeze.  Plan-try samples of Trelegy instead of Anoro for comparison    Overview:   Office Spirometry 03/16/2016-severe obstructive airways disease FVC 1.59/59%, FEV1 0.98/49%, ratio 0.62, FEF 25???75% 0.42/28%  PFT 04/16/16-severe obstructive airways disease with slight response to bronchodilator, severe diffusion defect. FEV1/FVC 0.64, DLCO 45%    Last Assessment & Plan:   Severe obstructive airways disease. She can't hear herself wheeze.  Plan-try samples of Trelegy instead of Anoro for comparison  Office Spirometry 03/16/2016-severe obstructive airways disease FVC 1.59/59%, FEV1 0.98/49%, ratio 0.62, FEF 25???75% 0.42/28%  PFT 04/16/16-severe obstructive  airways disease with slight response to bronchodilator, severe diffusion defect. FEV1/FVC 0.64, DLCO 45%    Last Assessment & Plan:   Severe COPD but mainly emphysema with limited bronchospasm component that could be addressed with bronchodilators.  Plan-try sample Bevespi as a maintenance controller, refill pro-air     ??? Esophageal reflux 04/30/2007     History of fundoplication     Last Assessment & Plan:   Emphasized continued attention to antireflux measures. Reminded that reflux can be a significant trigger for irritable airways, cough and wheeze.  History of fundoplication       Last Assessment & Plan:   Emphasized continued attention to antireflux measures. Reminded that reflux can be a significant trigger for irritable airways, cough and wheeze.     ??? 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 Management is mostly by her ENT physicians.     ??? Fibromyalgia 04/30/2007     Qualifier: Diagnosis of   By: Yetta Barre CNA/MA, Rose Fillers: Diagnosis of   By: Yetta Barre CNA/MA, Shanda Bumps     ??? Seasonal and perennial allergic rhinitis 04/30/2007     Qualifier: Diagnosis of   By: Yetta Barre CNA/MA, Shanda Bumps     Last Assessment & Plan:   She is now on prednisone 20 mg daily. I suggested she wait until her myasthenia status is stabilized. Then if she needs to she couldn't seek evaluation at one of the allergy practices in town as discussed, since our vaccine program will be closing.  Qualifier: Diagnosis of   By: Yetta Barre CNA/MA, Jessica       Last Assessment & Plan:   She is now on prednisone 20 mg daily. I suggested she wait until her myasthenia status is stabilized. Then if she needs to she couldn't seek evaluation at one of the allergy practices in town as discussed, since our vaccine program will be closing.           Objective:   BP 166/78  ~ Pulse 89  ~ Temp 36.9 ???C (98.5 ???F) (Oral) Physical Exam:  General: alert, appears stated age and cooperative  Eyes: conjunctiva and lids normal  ENMT: normal external nose and ears.  normal oropharynx  Lungs:normal respiratory effort.  no cyanosis.  Extremities:extremities normal, atraumatic, no cyanosis or edema  Skin: warm and dry, no rashes.  Psych: alert and oriented to person, place and time  MSK: tenderness of below muscle groups     Trigger/ Tender Points Identification:       Assessment and Plan   Neyda Bier is a 84 y.o. y.o. female who presents with   1. Cervicalgia  Acupuncture    INJECT TRIGGER POINTS, > 3   2. Cervical myofascial strain, sequela  Acupuncture    INJECT TRIGGER POINTS, > 3   3. Myofascial pain  Acupuncture    INJECT TRIGGER POINTS, > 3   4. Myasthenia gravis with exacerbation (HCC/RAF)     5. TMJ syndrome  INJECT TRIGGER POINTS, > 3   6. Chronic pain of left knee  Acupuncture     # Chronic neck pain  # Cervical myofascial pain  Temporary improvement with treatments, overall stable but persistent. Patient with chronic neck pain for years, underlying myasthenia gravis, and MRI with multilevel degenerative changes and neural foraminal narrowing. Exam reveals multiple  trigger points suggesting significant myofascial component to pain.   - repeat acupuncture today  - repeat trigger point injections today   - continue TENS unit use  - continue neck and heat shiatsu massage device   - gentle neck stretching    # Left lateral thigh strain  - TPIs done today    # Chronic left knee pain  - acupuncture done today     # TMJ syndrome  - TPIs done today  ???  # Myasthenia gravis   - Qi tonic foods   - can try American ginseng tea, recommended [address] [place]  - follow-up with neurology    Treatments Provided Today:     Acupuncture point interventions:    Sp-6/St-36, LI-4/Lv-3 LI-10 Sp-10, Sp-9           Heding, Xiyan     Acupuncture Treatment Duration: 20 Minutes  Injection of : Trigger Points with 0.52mL 1% lidocaine      Trapezius Levator scapulae, Rhomboids       masseter   Iliotibial band    Trigger Point Injection Procedure Note   Procedure: Trigger point injection of Trapezius Levator scapulae, Rhomboids       masseter   Iliotibial band  Indication: Myofascial pain; pain or altered sensation in the expected distribution of referred pain from a trigger point; taut band palpable in an accessible muscle with exquisite tenderness at one point along the length of it; some degree of restricted motion when measurable; reproduction of pain by stimulating trigger point  Consent: The risks (infection, bleeding, pain) and benefits of trigger points injections were explained to the patient and alternatives were discussed. All questions were answered to the patient's satisfaction. The patient verbalized understanding and consented to the trigger point injection(s).  Sterilization: The target area of the active trigger point(s) was sterilized using a 70% isopropyl alcohol pad.   Procedure: Manual palpation was used to identify the target area. Using a 1-inch 23 gauge and/or 0.5-inch 25 gauge needle, the following procedure was completed:   Injection of : Trigger Points with 0.58mL 1% lidocaine   into Trapezius Levator scapulae, Rhomboids       masseter   Iliotibial band  The needle was infiltrated 1-3 cm in a fanwise method into each trigger point location. Manual pressure was applied after the procedure. There was no significant blood loss. The patient tolerated the procedure well.    Follow-up     Return in about 4 weeks (around 09/18/2019) for Blake Divine / K Bowe Sidor or A Chu .    Future Appointments   Date Time Provider Department Center   09/09/2019 10:20 AM York Ram., MD GERI [facility] 420 MEDICINE   09/11/2019  3:30 PM Trenda Moots., MD [facility] [phone] MEDICINE   10/02/2019  3:30 PM Trenda Moots., MD [facility] [phone] MEDICINE   10/20/2019 10:30 AM [facility] B200 NEUROLOGY   05/03/2020  1:30 PM Shoku, Drue Flirt, OD DS V[facility] OPH       Seen and discussed with Dr. Norwood Levo    Author: Will Bonnet, MD, 08/22/2019, 12:59 PM   [facility] Fellow and Clinical Instructor    Pt seen with fellow physician. i agree with the exam findings and the assessment/plan that we formulated together as outlined above by Dr. Blake Divine with the following comments/corrections/additions:    IVIG on board (MG)    Teodoro Kil, MD

## 2019-09-08 NOTE — Progress Notes
OUTPATIENT GERIATRICS CLINIC NOTE    PATIENT:  [name]   MRN:  [id number]  DOB:  03-10-1935  DATE OF SERVICE:  09/09/2019  PRIMARY CARE PHYSICIAN: York Ram., MD    CHIEF COMPLAINT:   No chief complaint on file.      [facility]:  [employer]    [facility] [facility]:    Chaperone status:  No data recorded    HISTORY OF PRESENT ILLNESS     [name] is a 84 y.o. female who presents today for *follow up**. Patient is accompanied by *no one**. History today is per the patient,and review of available recent records in [facility] and Care Everywhere.     Patient is a(n) []  reliable []  unreliable historian and additional collateral information was obtained during the visit today from:     [facility]--March 25--not ereviewd--  [facility]--audiology--May 23, 2019--note reviewed      Per chart review, pertinent medical history:  No past medical history on file.  No past surgical history on file.    ALLERGIES     Allergies   Allergen Reactions   ??? Beta Adrenergic Blockers Other (See Comments)     Avoid d/t Myasthenia Gravis  Avoid d/t Myasthenia Gravis     ??? Macrolides And Ketolides Other (See Comments)     Avoid d/t Myasthenia Gravis  Use with caution d/t Myasthenia Gravis  Use with caution d/t Myasthenia Gravis     ??? Sulfa Antibiotics Other (See Comments)     May possibly have caused deafness in one ear  May possibly have caused deafness in one ear  other     ??? Botulinum Toxins Other (See Comments)     Muscle weakness r/t Myasthenia Gravis  Muscle weakness r/t Myasthenia Gravis     ??? Statins Other (See Comments)     Muscle aches  Muscle aches  Muscle aches     ??? Plastic Tape Itching and Other (See Comments)     Turns the skin red where applied        MEDICATIONS     Personally reviewed.    No outpatient medications have been marked as taking for the 09/09/19 encounter (Appointment) with York Ram., MD.       SOCIAL HISTORY     Social History     Social History Narrative   ??? Not on file FUNCTIONAL STATUS     BADLs:  IADLs:    Ambulates with   Falls in past year:  Afraid of falling:    GERIATRIC REVIEW OF SYSTEMS     Vision:    []   glasses     []  legally blind  Hearing: []  hearing aids    Nutrition: []  normal   []  impaired  []  vegan  []  vegetarian  []  low salt  []  low-carb   Swallowing: []  impaired   Dentures:   []  yes    Depression:  []  yes   Cognition:     Incontinence: [] urine  []  fecal  []  urine and fecal      ADVANCED CARE PLANNING     Advanced directives on file: []  No  []   Yes ??? Completed:     Medical DPOA on file:   []   Yes ???   []   No ??? Patient designates ** * to be their surrogate medical decision-maker.         HEALTH CARE MAINTENANCE     IMMUNIZATIONS:   Immunization History   Administered Date(s)  Administered   ??? COVID-19, mRNA, LNP-S, PF, 30 mcg/0.3 mL Dose (Pfizer-BioNTech) 03/11/2019, 04/01/2019   ??? DTaP 12/31/2015   ??? influenza vaccine IM quadrivalent (Fluzone Quad) MDV (7 months of age and older) 11/26/2016   ??? influenza vaccine IM quadrivalent adjuvanted (FluAD Quad) (PF) SYR (8 years of age and older) 11/05/2018   ??? influenza, unspecified formulation 04/01/2019, 04/01/2019   ??? pneumococcal polysaccharide vaccine 23-valent (Pneumovax) 01/10/2019       Mammogram:  DXA:  PAP:  Colonoscopy:      PHYSICAL EXAM     There were no vitals taken for this visit.  Wt Readings from Last 3 Encounters:   07/29/19 145 lb (65.8 kg)   07/14/19 145 lb 8 oz (66 kg)   06/03/19 141 lb (64 kg)         System Check if normal Positive or additional negative findings   GEN  [x]  NAD     Eyes  []  Conj/Lids []  Pupils  []  Fundi   []  Sclerae []  EOM     ENT  []  External ears   []  Otoscopy   []  Gross Hearing    []   External nose   []  Nasal mucosa   []  Lips/teeth/gums    []  Oropharynx    []  Mucus membranes      Neck  []  Inspection/palpation    []  Thyroid     Resp  []  Effort    []  Auscultation       CV  []  Rhythm/rate   []  Murmurs   []  Edema   []  JVP non-elevated    Normal pulses:   []  Radial []  Femoral  []  Pedal Breast  []  Inspection []  Palpation     GI  []  Bowel sounds    []  Nontender   []  No distension    []  No rebound or guarding   []  No masses   []  Liver/spleen    []  Rectal     GU  F:  []  External []  vaginal wall         []  Cervix     []  mucus        []  Uterus    []  Adnexa   M:  []  Scrotum []  Penis         []  Prostate     Lymph  []  Cervical []  supraclavicular     []  Axillae   []  Groin/inguinal     MSK []  Gait  []  Back     Specify site examined:    []  Inspect/palp []  ROM   []  Stability []  Strength/tone []  Used arms to push up from seated to standing position   Assistive device:  []  single point cane []  quad cane  []  FWW []  rollator walker      Skin  []  Inspection []  Palpation     Neuro  []  Alert and oriented     []  CN2-12 intact grossly   []  DTR      []  Muscle strength      []  Sensation   []  Pronator drift   []  Finger to Nose/Heel to Shin   []  Romberg     Psych  []  Insight/judgement     []  Mood/affect    []  Gross cognition        Patient had brief emotional/behavioral testing with standardized instruments as follows for the purpose of screening, case finding or evaluating treatment.  Results below:    GAD-7  PHQ-9 15    LABS/STUDIES  LABS:  Lab Results   Component Value Date    WBC 4.35 07/14/2019    WBC 4.40 04/14/2019    WBC 8.75 12/17/2018    HGB 12.7 07/14/2019    HGB 13.0 04/14/2019    HGB 12.2 12/17/2018    MCV 97.9 07/14/2019    PLT 323 07/14/2019    PLT 314 04/14/2019    PLT 420 (H) 12/17/2018     Lab Results   Component Value Date    NA 140 04/14/2019    NA 141 12/17/2018    K 4.5 04/14/2019    K 4.1 12/17/2018    CREAT 1.05 04/14/2019    CREAT 1.13 12/17/2018    GFRESTNOAA 50 04/14/2019    GFRESTNOAA 46 12/17/2018    GFRESTAA 58 04/14/2019    GFRESTAA 53 12/17/2018    CALCIUM 9.4 04/14/2019    CALCIUM 9.8 12/17/2018     Lab Results   Component Value Date    ALT 14 04/14/2019    ALT 11 12/17/2018    AST 21 04/14/2019    AST 20 12/17/2018    ALKPHOS 91 04/14/2019    BILITOT 0.2 04/14/2019    ALBUMIN 3.9 04/14/2019    ALBUMIN 4.3 12/17/2018     Lab Results   Component Value Date    TSH 1.00 12/17/2018     No results found for: HGBA1C  No results found for: CHOL, CHOLDLCAL  No results found for: CHOLDLQ  No results found for: [facility]  Lab Results   Component Value Date    FE 87 07/14/2019    FERRITIN 57 07/14/2019    TIBC 356 07/14/2019     No results found for: VITAMINB12, RPR  No results found for: BNP  No results found for: PSATOTAL      STUDIES:      ASSESSMENT and PLAN     Alvetta Hidrogo is a 84 y.o. female who presents today for *follow up**.      #HTN--trial nifedipine CR 30 mg daily.  #Macrocytosis--check B12, folic acid at some point  #Deconditioning--initiate home PT  #GERD--s/p Nisan fundoplication  #COPD--mixed--per PFT's from [name].--consider reinitiation of inhalers.  #s/p rectocele repair  #s/p cholecystectomy  #Nocturnal hypoxemia--presumably related to MG crisis but has history of COPD. ???Will order nocturnal home O2 study and consider PFT's/Pulmonary evaluation once settled down. ???May need to restart BREO inhaler.  #Myasthenia Gravis--s/p plasmapheresis x5 October 2020, no Thymoma, on Azathioprine 150 daily and mestinon 60 tid.??????Saw???Dr. Enedina Finner at [facility]. ???Was considering Soliris.??????Ordering IVIG and MRI.  Encourage ongoing neurology follow up.   #IBS--titrate miralax to comfort. ???Consider duloxetine substitute for buproprion.  #Interstitial cystitis--on daily Trimethoprim 100 for UTI prophylaxis. ???Using bladder instillations prn. OFF topical estrogen due to advice from neurologist re: MG.  To see Dr. Desma Maxim to see if she has additional insights.  #Major Depression--active. ???Likely to add or substitute duloxetine next.  Referred patient to Dr. Randie Heinz.  #Allergic rhinitis--previously on nasal steroids.  #Breast CA--DCIS???by report--obtain mammo. ???Patient declined surgery in 2019 and experienced dry mouth from Tamoxifen. ???Was considering Anastrozole. Await records from prior mammogram and treatment.  Per records from [facility]:   ''07/20/2017 Screening detected right breast calcifications and distortion. The distortion was in the upper outer quadrant: Biopsy DCIS intermediate grade with CSL; calcifications UIQ: 2.8 cm: Biopsy DCIS+ ALH +CSL; 4 cm apart, axilla negative, Tis NX stage 0''  Due for repeat mammo Apirl 2022  #DM with CKD--stabe 3b--based on labs from [place]  [place]. ???FOllow here. ???If BP permits, may consider ARB BUT patient with h/o angioedema so will ONLY initiate this agent with great caution.  Refer to Ophthalmology.  #OA knees--previously getting hyaluronic acid injections.  #Anxiety  #Benzodiazepine dependence--will work to wean over time.  #Fibromyalgia--duloxetine as above. 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  #Polypharmacy--add duloxetine 20  #Statin intolerance      FOLLOW-UP     RTC     Future Appointments   Date Time Provider Department Center   09/09/2019 10:20 AM York Ram., MD GERI IMS 420 MEDICINE   09/18/2019 10:30 AM Trenda Moots., MD ESTWS [phone] MEDICINE   09/19/2019  7:30 AM Alvina Chou, AuD MP2 AUD AUDIOLOGY/SP   10/02/2019  3:30 PM Trenda Moots., MD ESTWS [phone] MEDICINE   10/20/2019 10:30 AM [facility] NEUMUSC B200 NEUROLOGY   05/03/2020  1:30 PM Shoku, Bita, OD DS VIS REHAB OPH         The above plan of care, diagnosis, orders, and follow-up were discussed with the patient and/or surrogate. Questions related to this recommended plan of care were answered.    ANALYSIS OF DATA (Needs to meet 1 category for moderate and 2 categories for high LOS)     I have:     Category 1 (Needs 3 for moderate and high LOS)     []  Reviewed []  1 []  2 []  ? 3 unique laboratory, radiology, and/or diagnostic tests noted below    Test/Study:  on date .    []  Reviewed []  1 []  2 []  ? 3 prior external notes and incorporated into patient assessment    I reviewed Dr. 's note in specialty  from date .    []  Discussed management or test interpretation with external provider(s) as noted      []  Ordered []  1 []  2 []  ? 3 unique laboratory, radiology, and/or diagnostic tests noted in A&P    []  Obtained history from independent historian:       Category 2  []  Independently interpreted the test    Category 3  []  Discussed management or test interpretation with external provider(s) as noted      INTERACTION COMPLEXITY and SOCIAL DETERMINANTS of HEALTH       PROBLEM COMPLEXITY   []  New problem with uncertain diagnosis or prognosis (moderate)   []  Multiple stable chronic problems (moderate)   []  Chronic problem not stable - not controlled, symptomatic, or worsening (moderate)   []  Severe exacerbation of chronic problem (high)   []  New or chronic problem that poses threat to life or bodily function (high)     MANAGEMENT COMPLEXITY   []  Old or external/outside records reviewed   []  Discussion with alternate (proxy) if patient with impaired communication / comprehension ability (e.g., dementia, aphasia, severe hearing loss).   []  Repeated questions (or disagreement) between patient and/among caregivers/family during the visit.  []  Caregiver/patient emotions/behavior/beliefs interfering with implementation of treatment plan.  []  Independent interpretation of test (EKG, Chest XRay)   []  Discussion of case with a consultant physician     RISK LEVEL   []  Prescription drug management (moderate)   []  Minor surgery with CV risk factors or elective major surgery (moderate)   []  Dx or Rx significantly limited by SDoH (inadequate housing, living alone, poor health care access, inappropriate diet; low literacy) (moderate)   []  Major surgery - elective with CV risk factors or emergent (high)   []  Need  for hospitalization (high)   []  New DNR or de-escalation of care (high)      SDoH  The diagnosis or treatment of said conditions is significantly limited by the following social determinants of health:  []  Z59.0 Homelessness  []  Z59.1 Inadequate housing  []  Z59.2 Discord with neighbors, lodgers and landlord  []  Z60.2 Problems related to living alone  []  Z59.8 Other problems related to housing and economic circumstances  []  Z59.4 Lack of adequate food and safe drinking water  []  Z59.6 Low income  []  Z59.7 Insufficient social insurance and welfare support  []  Z59.9 Problems related to housing and economic circumstances, unspecified  []  Z75.3 Unavailability and inaccessibility of health care facilities  []  Z75.4 Unavailability and inaccessibility of other helping agencies  []  Z72.4 Inappropriate diet and eating habits  []  Z62.820 Parent-biological child conflict  []  Z63.8 Other specified problems related to primary support group  []  Z55.0 Illiteracy and low level literacy   []  Z56.9 Unspecified problems related to employment        If Billing Based on Time:     I performed the following items on the day of service:    [x]  Preparing to see the patient (e.g., review of tests)  [x]  Obtaining and/or reviewing separately obtained history   [x]  Performing a medically appropriate examination and/or evaluation   [x]  Counseling and educating the patient/family/caregiver   [x]  Ordering medications, tests, or procedures  [x]  Referring and communicating with other healthcare professionals (when not separately reported)  [x]  Documenting clinical information in the EHR  []  Independently interpreting results and communicating results to patient/family/caregiver    I spent the following total amount of time on these tasks on the day of service:  New Patient     Established Patient  []  15-29 minutes ??? [id number]    []  up to 9 minutes ??? 99211  []  30-44 minutes ??? [id number]     []  10-19 minutes ??? [id number]   []  45-59 minutes ??? [id number]      []  20-29 minutes ??? 99213   []  60-74 minutes ??? 99205   []  30-39 minutes ??? 99214         [x]  40-55 minutes ??? [id number]    []  I spent an additional ** * 15-minute-increment(s) for a total of * ** minutes on these tasks on the day of service. [phone] for each additional 15 minutes.)    Author: York Ram, MD 09/09/2019

## 2019-09-09 ENCOUNTER — Ambulatory Visit: Payer: Commercial Managed Care - Pharmacy Benefit Manager | Attending: Geriatric Medicine

## 2019-09-09 ENCOUNTER — Telehealth: Payer: BLUE CROSS/BLUE SHIELD

## 2019-09-09 DIAGNOSIS — R739 Hyperglycemia, unspecified: Secondary | ICD-10-CM

## 2019-09-09 DIAGNOSIS — N1831 Stage 3a chronic kidney disease: Secondary | ICD-10-CM

## 2019-09-09 DIAGNOSIS — C50919 Malignant neoplasm of unspecified site of unspecified female breast: Secondary | ICD-10-CM

## 2019-09-09 MED ORDER — DULOXETINE HCL 20 MG PO CPEP
20 mg | ORAL_CAPSULE | Freq: Every day | ORAL | 3 refills | Status: AC
Start: 2019-09-09 — End: ?

## 2019-09-09 NOTE — Telephone Encounter
Medication Verification      Medication: DULoxetine 20 mg DR capsule    Caller would like to verify the following (Please check all that applies): Pt has a history of allergy w this medication, pharmacy wants to confirm.    []  Dosage    []  Quantity      []  Directions     Additional information?      Patient or caller has been notified of the 24-48 hour turnaround time.

## 2019-09-10 NOTE — Telephone Encounter
Spoke with patient who cannot recall reaction to medication please advise.   As for labs she will come in for blood  / and urine collection    @ [name]  [name] will be coming in for her lab collection

## 2019-09-11 ENCOUNTER — Ambulatory Visit: Payer: BLUE CROSS/BLUE SHIELD

## 2019-09-11 ENCOUNTER — Telehealth: Payer: BLUE CROSS/BLUE SHIELD

## 2019-09-11 DIAGNOSIS — R42 Dizziness and giddiness: Secondary | ICD-10-CM

## 2019-09-11 MED ORDER — MECLIZINE HCL 12.5 MG PO TABS
12.5 mg | ORAL_TABLET | Freq: Every day | ORAL | 0 refills | Status: AC | PRN
Start: 2019-09-11 — End: 2019-09-19

## 2019-09-11 NOTE — Telephone Encounter
New Rx Request      Last seen by MD: 09/09/19    Reason for the request: Pt is having Vertigo    Any Symptoms:  [x]  Yes  []  No      ? If yes, what symptoms are you experiencing: Vertigo  o Duration of symptoms (how long):    o Have you taken medication for symptoms (OTC or Rx):      Is a particular medication being requested? Meclizine (unsure of dose)    Was an appointment offered? Yes    Patient or caller has been notified of the 24-48 hour turnaround time.

## 2019-09-11 NOTE — Telephone Encounter
Reply by: Drisana Schweickert Elizabeth Mackenize Delgadillo  See separate NT encounter

## 2019-09-11 NOTE — Progress Notes
[facility] TELEMEDICINE  Telehealth Video Visit    Patient Consent to Telehealth Questionnaire     [facility] TELEHEALTH PRECHECKIN QUESTIONS 04/08/2019   By clicking ''I Agree'', I consent to the below:  I Agree     - I agree  to be treated via a video visit and acknowledge that I may be liable for any relevant copays or coinsurance depending on my insurance plan.  - I understand that this video visit is offered for my convenience and I am able to cancel and reschedule for an in-person appointment if I desire.  - I also acknowledge that sensitive medical information may be discussed during this video visit appointment and that it is my responsibility to locate myself in a location that ensures privacy to my own level of comfort.  - I also acknowledge that I should not be participating in a video visit in a way that could cause danger to myself or to those around me (such as driving or walking).  If my provider is concerned about my safety, I understand that they have the right to terminate the visit.     Subjective:     CC: Vertigo.    Chronic medical conditions:  Patient Active Problem List   Diagnosis   ??? Acute medial meniscal tear, left, subsequent encounter   ??? Angioedema   ??? Anxiety   ??? Asthmatic bronchitis, mild persistent, with acute exacerbation   ??? Carcinoma in situ of breast   ??? Chronic cholecystitis   ??? Chronic interstitial cystitis   ??? CKD (chronic kidney disease), stage III   ??? COPD mixed type (HCC/RAF)   ??? Major depression, recurrent, chronic (HCC/RAF)   ??? Diabetes mellitus (HCC/RAF)   ??? Esophageal reflux   ??? Eustachian tube dysfunction   ??? Fibromyalgia   ??? H/O arthroscopy   ??? Insomnia   ??? Myasthenia gravis with exacerbation (HCC/RAF)   ??? Nocturnal hypoxemia   ??? Osteoarthritis of knee   ??? Renal cyst   ??? Seasonal and perennial allergic rhinitis   ??? Spondylosis without myelopathy or radiculopathy, lumbar region   ??? Urethral caruncle   ??? Vaginal atrophy   ??? Benzodiazepine dependence (HCC/RAF)   ??? Breast cancer (HCC/RAF) ??? Refractive error   ??? Ptosis of eyelid   ??? Pseudophakia of both eyes     HPI: Sakura Denis is a 84 y.o. female with PmHx as abovewho presents via telephone visit with vertigo.    Subjective: Presenting today via telephone visit, stating she continues to present with vertigo symptoms for the past few days. She mentions a history of hearing loss in an ear since she was young stating that she has lived most of her life with some vertigo and that it is not uncommon for her. She states that she has taken meclizine in the past, stating that it has been present, but is requiring a refill of Meclizine. She states she used to be on the medication from [name], but had to leave quickly and only has an old prescription at this time.    Medications  No outpatient medications have been marked as taking for the 09/11/19 encounter (Telephone) with Pershing Cox., DO, MS.       Review of Systems  The following review of systems completed and were negative except as described above: Constitutional, Eyes, Ear, nose, throat, Respiratory, Cardiac, GI and Skin    Objective:     Physical Exam  General: alert, well appearing, and in no distress  Pulm: No increased work of breathing; Taking full breaths and speaking in full sentences without issue   Neurologic: Normal speech, no focal findings or movement disorder noted.  Psych: alert, oriented x3, normal behavior and mood     Assessment:     Krystyl Cannell is a 84 y.o. female who presents with     Plan:     1. Vertigo  meclizine 12.5 mg tablet   - Given prior efficacy of Meclizine 12.5mg , and ongoing symptoms, recommend short dose of Meclizine 12.5mg . Discussed that Meclizine is generally avoided if possible in patients > 19 years of age. She states she tolerates it well and without issue. Discussed to continue to discuss ongoing vertigo management.    The above plan of care, diagnosis, orders, and follow-up were discussed with the patient.  Questions related to this recommended plan of care were answered.    Louella Medaglia S. Paulo Fruit, MS  Family Medicine  Clinical Instructor of Medicine, [employer] Department of Medicine  Division of General Internal Medicine & Health Services Research    09/11/2019 at 12:34 PM     A total of 10 minutes were spent with the patient during this encounter and over half of that time was spent on counseling and coordination of care.

## 2019-09-14 NOTE — Consults
PRECHART ***     Hearing Aid Check    [name] was fitted with a hearing aid for the right ear. [name] fitted with a BiCROS device on 05/23/2019 and returned the device on 07/04/2019 due to lack of benefit.     Hearing Aid(s)  Manufacturer: Phonak  Model: Audeo P70  Right SN: [id number]  Speaker Length & Power: 2S  Dome Size: Large vented  Battery Size: 312  Initial HAF: 07/22/2019  Hearing Aid Warranty Expiration: 10/06/2022  Right to Return Expiration Date: 09/05/2019    Accessories:   TV Connector    SN: ***  Warranty: n/a sent as part of promotion     Patient reports:    ***   Did not complete feedback in either ear for better fit to target and no feedback present in office.    Gain: 100%    Paired hearing aid to patient's phone and Phonak App. Successfully streamed phone call and music in office.       Actions taken:   {Outcomes:304973}    Follow-up Recommendations:  {Follow-up:304971}      Lilyan Gilford, AuD,        Education:  Topic: {audedutopic1:31062}  Person Taught: {patient/wife/etc:31877}  Barrier: {Barrier:31058}  Needs: {Needs:31059}  Teaching Method: {Teaching Method:24200}    Outcome: {Outcome:31060}

## 2019-09-17 DIAGNOSIS — M26629 Arthralgia of temporomandibular joint, unspecified side: Secondary | ICD-10-CM

## 2019-09-17 DIAGNOSIS — M542 Cervicalgia: Secondary | ICD-10-CM

## 2019-09-17 DIAGNOSIS — M25562 Pain in left knee: Secondary | ICD-10-CM

## 2019-09-17 DIAGNOSIS — M7918 Myalgia, other site: Secondary | ICD-10-CM

## 2019-09-17 DIAGNOSIS — S161XXS Strain of muscle, fascia and tendon at neck level, sequela: Secondary | ICD-10-CM

## 2019-09-17 DIAGNOSIS — G8929 Other chronic pain: Secondary | ICD-10-CM

## 2019-09-17 NOTE — Progress Notes
[facility] Progress Note     PATIENT: [name]  MRN: [id number]  DOB: June 06, 1935  DATE OF SERVICE: 09/18/2019  REFERRING PRACTITIONER: Serita Kyle., MD, MPH  PRIMARY CARE PROVIDER: York Ram., MD    Chief Complaint   Patient presents with   ??? Knee Pain   ??? Jaw Pain   ??? Neck Pain     Subjective:      History of Present Illness:  [name] is a 84 y.o. female with PMH of  has no past medical history on file. who presents for follow-up cervical and lumbar spondylosis.     Initial Consult 04/16/19  # Chronic neck and shoulder pain  - present for many years, gradual onset, no inciting event or trauma. Underlying myasthenia gravis.   - Prior work-up: MR cervical spine with multilevel degenerative disc disease in setting of C4-C7 congenital cervical spinal canal stenosis, neural foraminal narrowing, facet joint degeneration   - Interventions: no medications for pain; was going to start PT but didn't go due to pandemic  - Self-care: cervical collar sometimes if needed to bend down  - Timing/Duration: constant, associated with activities   - Location/Radiation: bilateral neck, no radiation  - Quality: aching, sharp at times  - Severity: varies between 5 to 10 out of 10  - Modifiers: worse with activity. better with rest.   - Associated s/s: diffuse generalized weakness of bilateral lower extremities (thinks its related to myasthenia gravis), leg pain  - Denies: numbness, tingling, upper extremity weakness, changes in bowel or bladder, changes in gait  ???  # Myasthenia gravis  - hospitalized 11/2018 for exacerbation, receiving IVIG infusions regularly  - uses a walker / cane sometimes  ???  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 Husband died 2 years ago of CHF.   BM: intermittent constipation, diarrhea   Temperature: tends to run cold  Supplements: none  __________________________________________________    Follow-up 05/21/19  - feels the treatments last visit did help reduce her pain temporarily   - still with persistent neck pain and left knee weakness  - got neck and shoulder massager and using it at home  - purchased TENS unit but forgot to bring it today  - moved [place]    Follow-up 06/12/19  - treatments help temporarily with the neck pain  - using shiatsu neck massager  - has TENS unit, wondering where to place     Follow-up 07/03/19  - neck pain and left knee pain persists   - overall doesn't feel well, wondering if stopping her mestinon contributed, had upcoming neurology appointment  - brought TENS unit in today, was able to get it to work, but wondered where she should put the pads    Follow-up 07/17/19  - neck pain persistent, but treatments help temporarily  - saw neuromuscular, ordered sleep study, had IVIG yesterday with improvement of symptoms   - using TENS unit for neck and heating pad, with some help    Follow-up 07/31/19  - neck and shoulders doing ok  - left lateral thigh with increased myofascial pain, as well as left knee has been bothering her more    Follow-up 08/21/19  - IVIG helping, felt  the best in a while this past Thursday   - jaw tightness acting up, biting mouth, has TMJ syndrome   - left IT band still with pain, neck and shoulder doing better, treatments helping     Follow-up 09/18/19   - neck a lot better, but still having jaw clenching and grinding  - left knee bothering her   - dog fell off bed and needed surgery        Medications that the patient states to be currently taking   Medication Sig   ??? Albuterol Sulfate AEPB Takes 1 to 2 puffs every 4 hours as needed for wheezing or SOB Inhale Takes 1 to 2 puffs every 4 hours as needed for wheezing or SOB .Marland Kitchen   ??? azaTHIOprine 50 mg tablet Take 1.5 tablets (75 mg total) by mouth two (2) times daily.   ??? buPROPion, SR, (WELLBUTRIN SR) 200 mg 12 hr tablet Take 1 tablet (200 mg total) by mouth two (2) times daily.   ??? clonazePAM 1 mg disintegrating tablet Take 1 tablet (1 mg total) by mouth at bedtime. Max Daily Amount: 1 mg   ??? clonazePAM 1 mg tablet Take 1 tablet (1 mg total) by mouth three (3) times daily. Max Daily Amount: 3 mg   ??? dexlansoprazole 60 mg DR capsule Take 1 capsule (60 mg total) by mouth daily.   ??? DULoxetine 20 mg DR capsule Take 1 capsule (20 mg total) by mouth daily.   ??? GAMMAGARD 30 GM/300ML SOLN every twenty one (21) days .   ??? lidocaine 5% patch Place 1 patch onto the skin every twelve (12) hours 12 hours on 12 hours off.Marland Kitchen   ??? montelukast 10 mg tablet Take 10 mg by mouth as needed for.   ??? NIFEdipine (PROCARDIA XL) 30 mg 24 hr tablet Take 1 tablet (30 mg total) by mouth daily.   ??? pyridostigmine 60 mg tablet Take 1 tablet (60 mg total) by mouth two (2) times daily.   ??? trimethoprim 100 mg tablet Take 1 tablet (100 mg total) by mouth daily.   No past medical history on file.      Objective:   BP 157/74  ~ Pulse 97  ~ Temp 36.9 ???C (98.5 ???F) (Oral)     Physical Exam:  General: alert, appears stated age and cooperative  Eyes: conjunctiva and lids normal  ENMT: normal external nose and ears.  normal oropharynx  Lungs:normal respiratory effort.  no cyanosis.  Extremities:extremities normal, atraumatic, no cyanosis or edema  Skin: warm and dry, no rashes.  Psych: alert and oriented to person, place and time  MSK: tenderness of below muscle groups     Trigger/ Tender Points Identification:   No images are attached to the encounter.      Assessment and Plan   [name] is a 83 y.o. y.o. female who presents with   1. Cervicalgia  Acupuncture    Inject trigger points, > 3    CANCELED: INJECT TRIGGER POINTS, > 3   2. Cervical myofascial strain, sequela  Acupuncture    Inject trigger points, > 3    CANCELED: INJECT TRIGGER POINTS, > 3 3. Myofascial pain  Acupuncture    Inject trigger points, > 3    CANCELED: INJECT TRIGGER POINTS, > 3   4. Myasthenia gravis with exacerbation (HCC/RAF)     5. TMJ syndrome  Acupuncture    Inject trigger points, > 3   6. Chronic pain of left knee  CANCELED:  INJECT TRIGGER POINTS, > 3     # Chronic neck pain  # Cervical myofascial pain  Improvement with treatments, overall stable but persistent. Patient with chronic neck pain for years, underlying myasthenia gravis, and MRI with multilevel degenerative changes and neural foraminal narrowing. Exam reveals multiple trigger points suggesting significant myofascial component to pain.   - repeat acupuncture today  - repeat trigger point injections today   - continue TENS unit use  - continue neck and heat shiatsu massage device   - gentle neck stretching. Discussed community massage.     # Left lateral thigh strain  - TPIs done today    # Chronic left knee pain  - acupuncture done today     # TMJ syndrome  - TPIs done today  ???  # Myasthenia gravis   - Qi tonic foods   - can try American ginseng tea,   - follow-up with neurology, receiving IVIG     Treatments Provided Today:     Acupuncture point interventions:    LI-4/Lv-3, Sp-6/St-36 LI-10 Sp-10, Sp-9   K-3   GB-31   Heding     Acupuncture Treatment Duration: 20 Minutes  Injection of : Trigger Points with 0.57mL 1% lidocaine      Trapezius, Sternocleidomastoid Levator scapulae, Rhomboids       masseter   Iliotibial band    Trigger Point Injection Procedure Note   Procedure: Trigger point injection of Trapezius, Sternocleidomastoid Levator scapulae, Rhomboids       masseter   Iliotibial band  Indication: Myofascial pain; pain or altered sensation in the expected distribution of referred pain from a trigger point; taut band palpable in an accessible muscle with exquisite tenderness at one point along the length of it; some degree of restricted motion when measurable; reproduction of pain by stimulating trigger point Consent: The risks (infection, bleeding, pain) and benefits of trigger points injections were explained to the patient and alternatives were discussed. All questions were answered to the patient's satisfaction. The patient verbalized understanding and consented to the trigger point injection(s).  Sterilization: The target area of the active trigger point(s) was sterilized using a 70% isopropyl alcohol pad.   Procedure: Manual palpation was used to identify the target area. Using a 1-inch 23 gauge and/or 0.5-inch 25 gauge needle, the following procedure was completed:   Injection of : Trigger Points with 0.52mL 1% lidocaine   into Trapezius, Sternocleidomastoid Levator scapulae, Rhomboids       masseter   Iliotibial band  The needle was infiltrated 1-3 cm in a fanwise method into each trigger point location. Manual pressure was applied after the procedure. There was no significant blood loss. The patient tolerated the procedure well.    Follow-up     Return in about 4 weeks (around 10/16/2019) for Blake Divine / A Renesmee Raine.    Future Appointments   Date Time Provider Department Center   10/02/2019  3:30 PM Trenda Moots., MD [facility] [id number] MEDICINE   10/08/2019  4:20 PM York Ram., MD GERI IMS [id number] MEDICINE   10/20/2019 10:30 AM NEU [facility] NEUMUSC B200 NEUROLOGY   10/22/2019  2:30 PM Trenda Moots., MD [facility] [id number] MEDICINE   05/03/2020  1:30 PM Sherilyn Banker, OD DS VIS REHAB OPH     Seen and discussed with Dr. Kathalene Frames    Author: Will Bonnet, MD, 09/18/2019, 12:47 PM   [facility] Fellow and Clinical Instructor    Patient was seen and examined  with the fellow Dr. Will Bonnet. I agree with the findings/assessment/plan we formulated together as documented above.    Kathalene Frames MD MPH  [facility]  [facility]  Pgr [phone]  09/18/2019 12:47 PM

## 2019-09-18 ENCOUNTER — Ambulatory Visit: Payer: Commercial Managed Care - Pharmacy Benefit Manager

## 2019-09-18 NOTE — Patient Instructions
--   Look into [place] in the area - consider Alon[facility] Bogus as a potential option.

## 2019-09-19 ENCOUNTER — Ambulatory Visit: Payer: BLUE CROSS/BLUE SHIELD | Attending: Audiologist

## 2019-09-21 DIAGNOSIS — R0602 Shortness of breath: Secondary | ICD-10-CM

## 2019-09-22 ENCOUNTER — Inpatient Hospital Stay: Payer: BLUE CROSS/BLUE SHIELD | Attending: Geriatric Medicine

## 2019-09-22 ENCOUNTER — Ambulatory Visit: Payer: BLUE CROSS/BLUE SHIELD | Attending: Geriatric Medicine

## 2019-09-22 DIAGNOSIS — Z20822 Suspected COVID-19 virus infection: Secondary | ICD-10-CM

## 2019-09-22 DIAGNOSIS — R0602 Shortness of breath: Secondary | ICD-10-CM

## 2019-09-22 MED ORDER — AMOXICILLIN-POT CLAVULANATE 875-125 MG PO TABS
1 | ORAL_TABLET | Freq: Two times a day (BID) | ORAL | 0 refills | 7.00 days | Status: AC
Start: 2019-09-22 — End: ?
  Filled 2019-09-22: qty 14, 7d supply, fill #0

## 2019-09-22 MED ORDER — BREO ELLIPTA 100-25 MCG/INH IN AEPB
1 | Freq: Every day | RESPIRATORY_TRACT | 3 refills | Status: AC
Start: 2019-09-22 — End: 2019-09-22
  Filled 2019-09-22: qty 60, 30d supply, fill #0

## 2019-09-22 MED ORDER — BREO ELLIPTA 100-25 MCG/INH IN AEPB
1 | Freq: Every day | RESPIRATORY_TRACT | 3 refills | 30.00 days | Status: AC
Start: 2019-09-22 — End: ?

## 2019-09-22 NOTE — Progress Notes
OUTPATIENT GERIATRICS CLINIC NOTE    PATIENT:  [name]   MRN:  [id number]  DOB:  Sep 30, 1935  DATE OF SERVICE:  09/22/2019  PRIMARY CARE PHYSICIAN: York Ram., MD    CHIEF COMPLAINT:   No chief complaint on file.      [place] Specialists:  [employer]    Non- Specialists:    Chaperone status:  No data recorded    HISTORY OF PRESENT ILLNESS     [name] is a 84 y.o. female who presents today for *follow up**. Patient is accompanied by *no one**. History today is per the patient,and review of available recent records in [facility] and Care Everywhere.     Patient is a(n) []  reliable []  unreliable historian and additional collateral information was obtained during the visit today from:     SOB  Wheezy    Not better with albuterol.    Per chart review, pertinent medical history:  No past medical history on file.  No past surgical history on file.    ALLERGIES     Allergies   Allergen Reactions   ??? Beta Adrenergic Blockers Other (See Comments)     Avoid d/t Myasthenia Gravis  Avoid d/t Myasthenia Gravis     ??? Macrolides And Ketolides Other (See Comments)     Avoid d/t Myasthenia Gravis  Use with caution d/t Myasthenia Gravis  Use with caution d/t Myasthenia Gravis     ??? Sulfa Antibiotics Other (See Comments)     May possibly have caused deafness in one ear  May possibly have caused deafness in one ear  other     ??? Botulinum Toxins Other (See Comments)     Muscle weakness r/t Myasthenia Gravis  Muscle weakness r/t Myasthenia Gravis     ??? Statins Other (See Comments)     Muscle aches  Muscle aches  Muscle aches     ??? Plastic Tape Itching and Other (See Comments)     Turns the skin red where applied        MEDICATIONS     Personally reviewed.    No outpatient medications have been marked as taking for the 09/22/19 encounter (Appointment) with York Ram., MD.       SOCIAL HISTORY     Social History     Social History Narrative   ??? Not on file        FUNCTIONAL STATUS     BADLs:  IADLs: Ambulates with   Falls in past year:  Afraid of falling:    GERIATRIC REVIEW OF SYSTEMS     Vision:    []   glasses     []  legally blind  Hearing: []  hearing aids    Nutrition: []  normal   []  impaired  []  vegan  []  vegetarian  []  low salt  []  low-carb   Swallowing: []  impaired   Dentures:   []  yes    Depression:  []  yes   Cognition:     Incontinence: [] urine  []  fecal  []  urine and fecal      ADVANCED CARE PLANNING     Advanced directives on file: []  No  []   Yes ??? Completed:     Medical DPOA on file:   []   Yes ???   []   No ??? Patient designates ** * to be their surrogate medical decision-maker.         HEALTH CARE MAINTENANCE     IMMUNIZATIONS:   Immunization History  Administered Date(s) Administered   ??? COVID-19, mRNA, LNP-S, PF, 30 mcg/0.3 mL Dose (Pfizer-BioNTech) 03/11/2019, 04/01/2019   ??? DTaP 12/31/2015   ??? influenza vaccine IM quadrivalent (Fluzone Quad) MDV (46 months of age and older) 11/26/2016   ??? influenza vaccine IM quadrivalent adjuvanted (FluAD Quad) (PF) SYR (28 years of age and older) 11/05/2018   ??? influenza, unspecified formulation 04/01/2019, 04/01/2019   ??? pneumococcal polysaccharide vaccine 23-valent (Pneumovax) 01/10/2019       Mammogram:  DXA:  PAP:  Colonoscopy:      PHYSICAL EXAM     BP 169/69 (BP Location: Left arm, Patient Position: Sitting, Cuff Size: Regular)  ~ Pulse 99  ~ Temp 36.8 ???C (98.2 ???F) (Temporal)  ~ Resp 16  ~ Ht 5' 5'' (1.651 m)  ~ Wt 146 lb (66.2 kg)  ~ LMP  (Approximate)  ~ SpO2 94%  ~ BMI 24.30 kg/m???   Wt Readings from Last 3 Encounters:   09/22/19 146 lb (66.2 kg)   09/09/19 144 lb (65.3 kg)   07/29/19 145 lb (65.8 kg)         System Check if normal Positive or additional negative findings   GEN  [x]  NAD     Eyes  []  Conj/Lids []  Pupils  []  Fundi   []  Sclerae []  EOM     ENT  []  External ears   []  Otoscopy   []  Gross Hearing    []   External nose   []  Nasal mucosa   []  Lips/teeth/gums    []  Oropharynx    []  Mucus membranes      Neck  [x]  Inspection/palpation    [x]  Thyroid Resp  [x]  Effort    [x]  Auscultation       CV  []  Rhythm/rate   [x]  Murmurs   [x]  Edema   []  JVP non-elevated    Normal pulses:   []  Radial []  Femoral  []  Pedal     Breast  []  Inspection []  Palpation     GI  []  Bowel sounds    [x]  Nontender   [x]  No distension    []  No rebound or guarding   []  No masses   []  Liver/spleen    []  Rectal     GU  F:  []  External []  vaginal wall         []  Cervix     []  mucus        []  Uterus    []  Adnexa   M:  []  Scrotum []  Penis         []  Prostate     Lymph  [x]  Cervical []  supraclavicular     [x]  Axillae   []  Groin/inguinal     MSK []  Gait  []  Back     Specify site examined:    []  Inspect/palp []  ROM   []  Stability []  Strength/tone []  Used arms to push up from seated to standing position   Assistive device:  []  single point cane []  quad cane  []  FWW []  rollator walker      Skin  []  Inspection []  Palpation     Neuro  []  Alert and oriented     []  CN2-12 intact grossly   []  DTR      []  Muscle strength      []  Sensation   []  Pronator drift   []  Finger to Nose/Heel to Shin   []  Romberg     Psych  []  Insight/judgement     []   Mood/affect    []  Gross cognition            LABS/STUDIES     LABS:  Lab Results   Component Value Date    WBC 4.35 07/14/2019    WBC 4.40 04/14/2019    WBC 8.75 12/17/2018    HGB 12.7 07/14/2019    HGB 13.0 04/14/2019    HGB 12.2 12/17/2018    MCV 97.9 07/14/2019    PLT 323 07/14/2019    PLT 314 04/14/2019    PLT 420 (H) 12/17/2018     Lab Results   Component Value Date    NA 140 04/14/2019    NA 141 12/17/2018    K 4.5 04/14/2019    K 4.1 12/17/2018    CREAT 1.05 04/14/2019    CREAT 1.13 12/17/2018    GFRESTNOAA 50 04/14/2019    GFRESTNOAA 46 12/17/2018    GFRESTAA 58 04/14/2019    GFRESTAA 53 12/17/2018    CALCIUM 9.4 04/14/2019    CALCIUM 9.8 12/17/2018     Lab Results   Component Value Date    ALT 14 04/14/2019    ALT 11 12/17/2018    AST 21 04/14/2019    AST 20 12/17/2018    ALKPHOS 91 04/14/2019    BILITOT 0.2 04/14/2019    ALBUMIN 3.9 04/14/2019    ALBUMIN 4.3 12/17/2018     Lab Results   Component Value Date    TSH 1.00 12/17/2018     No results found for: HGBA1C  No results found for: CHOL, CHOLDLCAL  No results found for: CHOLDLQ  No results found for: [facility]  Lab Results   Component Value Date    FE 87 07/14/2019    FERRITIN 57 07/14/2019    TIBC 356 07/14/2019     No results found for: VITAMINB12, RPR  No results found for: BNP  No results found for: PSATOTAL      STUDIES:    EKG  This data element was independently reviewed and interpreted by me     NSR NO ST or T changes    XR CHEST PA LAT 2V  September 22, 2019???  COMPARISON: KUB December 17, 2018  ???  History: sob  ???  FINDINGS:  ???  Lungs: Clear  Heart/aorta: Normal heart size. The thoracic aorta is mildly calcified, tortuous and ectatic.  Adenopathy: None  Pleura: No effusion  Bones and Chest wall: No acute bony or body wall  findings. Osteopenia and bony maturational changes. Right upper quadrant cholecystectomy clips stable from October 2020  ???  ???  IMPRESSION:  ???  No acute findings or abnormalities related to provided history.    ASSESSMENT and PLAN     [name] is a 84 y.o. female who presents today for *follow up**.      #SOB--low peak flow. Suspect COPD flare.  No oral steroids due to prior skin sores.  Trial breo, augmentin (due to MG),  Check CBC, BNP, CXR.  May need additional allergy treatment.  #HTN--trial nifedipine CR 30 mg daily.  #Aortic Calcification--noted on chest imaging     September 22, 2019???       --Not Worsening.  Chronic.  Will monitor and treat underlying risk factors with medical and lifestyle interventions as warranted by balance between benefits and burdens.    #Macrocytosis--check B12, folic acid at some point  #Deconditioning--initiate home PT  #GERD--s/p Nisan fundoplication  #COPD--mixed--per PFT's from [name].--consider reinitiation of inhalers.  #s/p rectocele repair  #  s/p cholecystectomy  #Nocturnal hypoxemia--presumably related to MG crisis but has history of COPD. ???Will order nocturnal home O2 study and consider PFT's/Pulmonary evaluation once settled down. ???May need to restart BREO inhaler.  #Myasthenia Gravis--s/p plasmapheresis x5 October 2020, no Thymoma, on Azathioprine 150 daily and mestinon 60 tid.??????Saw???Dr. Enedina Finner at [facility]. ???Was considering Soliris.??????Ordering IVIG and MRI.  Encourage ongoing neurology follow up.   #IBS--titrate miralax to comfort. ???Consider duloxetine substitute for buproprion.  #Interstitial cystitis--on daily Trimethoprim 100 for UTI prophylaxis. ???Using bladder instillations prn. OFF topical estrogen due to advice from neurologist re: MG.  To see Dr. Desma Maxim to see if she has additional insights.  #Major Depression--active. ???Likely to add or substitute duloxetine next.  Referred patient to Dr. Randie Heinz.  #Allergic rhinitis--previously on nasal steroids.  #Breast CA--DCIS???by report--obtain mammo. ???Patient declined surgery in 2019 and experienced dry mouth from Tamoxifen. ???Was considering Anastrozole. Await records from prior mammogram and treatment.  Per records from [facility]:   ''07/20/2017 Screening detected right breast calcifications and distortion. The distortion was in the upper outer quadrant: Biopsy DCIS intermediate grade with CSL; calcifications UIQ: 2.8 cm: Biopsy DCIS+ ALH +CSL; 4 cm apart, axilla negative, Tis NX stage 0''  Due for repeat mammo Apirl 2022  #DM with CKD--stabe 3b--based on labs from [name]. ???FOllow here. ???If BP permits, may consider ARB BUT patient with h/o angioedema so will ONLY initiate this agent with great caution.  Refer to Ophthalmology.  #OA knees--previously getting hyaluronic acid injections.  #Anxiety  #Benzodiazepine dependence--will work to wean over time.  #Fibromyalgia--duloxetine as above. 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  #Polypharmacy--add duloxetine 20  #Statin intolerance      FOLLOW-UP     RTC     Future Appointments   Date Time Provider Department Center   09/22/2019  8:00 AM York Ram., MD GERI IMS 420 MEDICINE   10/02/2019  3:30 PM Trenda Moots., MD ESTWS [phone] MEDICINE   10/05/2019 10:30 AM Alvina Chou, AuD MP2 AUD AUDIOLOGY/SP   10/08/2019  4:20 PM York Ram., MD GERI IMS 420 MEDICINE   10/20/2019 10:30 AM [facility]U [facility] NEUMUSC B200 NEUROLOGY   10/22/2019  2:30 PM Trenda Moots., MD ESTWS [phone] MEDICINE   05/03/2020  1:30 PM Shoku, Drue Flirt, OD DS VIS REHAB OPH         The above plan of care, diagnosis, orders, and follow-up were discussed with the patient and/or surrogate. Questions related to this recommended plan of care were answered.    ANALYSIS OF DATA (Needs to meet 1 category for moderate and 2 categories for high LOS)     I have:     Category 1 (Needs 3 for moderate and high LOS)     []  Reviewed []  1 []  2 []  ? 3 unique laboratory, radiology, and/or diagnostic tests noted below    Test/Study:  on date .    []  Reviewed []  1 []  2 []  ? 3 prior external notes and incorporated into patient assessment    I reviewed Dr. 's note in specialty  from date .    []  Discussed management or test interpretation with external provider(s) as noted      []  Ordered []  1 []  2 []  ? 3 unique laboratory, radiology, and/or diagnostic tests noted in A&P    []  Obtained history from independent historian:       Category 2  []  Independently interpreted the test  Category 3  []  Discussed management or test interpretation with external provider(s) as noted      INTERACTION COMPLEXITY and SOCIAL DETERMINANTS of HEALTH       PROBLEM COMPLEXITY   []  New problem with uncertain diagnosis or prognosis (moderate)   []  Multiple stable chronic problems (moderate)   []  Chronic problem not stable - not controlled, symptomatic, or worsening (moderate)   []  Severe exacerbation of chronic problem (high)   []  New or chronic problem that poses threat to life or bodily function (high)     MANAGEMENT COMPLEXITY   []  Old or external/outside records reviewed   []  Discussion with alternate (proxy) if patient with impaired communication / comprehension ability (e.g., dementia, aphasia, severe hearing loss).   []  Repeated questions (or disagreement) between patient and/among caregivers/family during the visit.  []  Caregiver/patient emotions/behavior/beliefs interfering with implementation of treatment plan.  []  Independent interpretation of test (EKG, Chest XRay)   []  Discussion of case with a consultant physician     RISK LEVEL   []  Prescription drug management (moderate)   []  Minor surgery with CV risk factors or elective major surgery (moderate)   []  Dx or Rx significantly limited by SDoH (inadequate housing, living alone, poor health care access, inappropriate diet; low literacy) (moderate)   []  Major surgery - elective with CV risk factors or emergent (high)   []  Need for hospitalization (high)   []  New DNR or de-escalation of care (high)      SDoH  The diagnosis or treatment of said conditions is significantly limited by the following social determinants of health:  []  Z59.0 Homelessness  []  Z59.1 Inadequate housing  []  Z59.2 Discord with neighbors, lodgers and landlord  []  Z60.2 Problems related to living alone  []  Z59.8 Other problems related to housing and economic circumstances  []  Z59.4 Lack of adequate food and safe drinking water  []  Z59.6 Low income  []  Z59.7 Insufficient social insurance and welfare support  []  Z59.9 Problems related to housing and economic circumstances, unspecified  []  Z75.3 Unavailability and inaccessibility of health care facilities  []  Z75.4 Unavailability and inaccessibility of other helping agencies  []  Z72.4 Inappropriate diet and eating habits  []  Z62.820 Parent-biological child conflict  []  Z63.8 Other specified problems related to primary support group  []  Z55.0 Illiteracy and low level literacy   []  Z56.9 Unspecified problems related to employment        If Billing Based on Time:     I performed the following items on the day of service:    [x]  Preparing to see the patient (e.g., review of tests)  [x]  Obtaining and/or reviewing separately obtained history   [x]  Performing a medically appropriate examination and/or evaluation   [x]  Counseling and educating the patient/family/caregiver   [x]  Ordering medications, tests, or procedures  [x]  Referring and communicating with other healthcare professionals (when not separately reported)  [x]  Documenting clinical information in the EHR  []  Independently interpreting results and communicating results to patient/family/caregiver    I spent the following total amount of time on these tasks on the day of service:  New Patient     Established Patient  []  15-29 minutes ??? [id number]    []  up to 9 minutes ??? [id number]  []  30-44 minutes ??? [id number]     []  10-19 minutes ??? [id number]   []  45-59 minutes ??? [id number]      []  20-29 minutes ??? [id number]   []  60-74 minutes ??? [id number]   []   30-39 minutes ??? [id number]         [x]  40-55 minutes ??? [id number]    []  I spent an additional ** * 15-minute-increment(s) for a total of * ** minutes on these tasks on the day of service. [phone] for each additional 15 minutes.)    Author: York Ram, MD 09/22/2019

## 2019-09-22 NOTE — Nursing Note
Peak flow performed as ordered; Peak Flow: 1. 140  2. 140  3. 140 Patient tolerated procedure well; MD notified.

## 2019-09-25 DIAGNOSIS — I700 Atherosclerosis of aorta: Secondary | ICD-10-CM

## 2019-09-30 DIAGNOSIS — M542 Cervicalgia: Secondary | ICD-10-CM

## 2019-09-30 DIAGNOSIS — M26629 Arthralgia of temporomandibular joint, unspecified side: Secondary | ICD-10-CM

## 2019-09-30 DIAGNOSIS — M7918 Myalgia, other site: Secondary | ICD-10-CM

## 2019-09-30 DIAGNOSIS — S161XXS Strain of muscle, fascia and tendon at neck level, sequela: Secondary | ICD-10-CM

## 2019-09-30 NOTE — Progress Notes
[facility] Progress Note     PATIENT: [name]  MRN: [id number]  DOB: 1936/02/12  DATE OF SERVICE: 10/02/2019  REFERRING PRACTITIONER: No ref. provider found  PRIMARY CARE PROVIDER: York Ram., MD    Chief Complaint   Patient presents with   ??? Neck Pain     Subjective:      History of Present Illness:  [name] is a 83 y.o. female with PMH of  has no past medical history on file. who presents for follow-up cervical and lumbar spondylosis.     Initial Consult 04/16/19  # Chronic neck and shoulder pain  - present for many years, gradual onset, no inciting event or trauma. Underlying myasthenia gravis.   - Prior work-up: MR cervical spine with multilevel degenerative disc disease in setting of C4-C7 congenital cervical spinal canal stenosis, neural foraminal narrowing, facet joint degeneration   - Interventions: no medications for pain; was going to start PT but didn't go due to pandemic  - Self-care: cervical collar sometimes if needed to bend down  - Timing/Duration: constant, associated with activities   - Location/Radiation: bilateral neck, no radiation  - Quality: aching, sharp at times  - Severity: varies between 5 to 10 out of 10  - Modifiers: worse with activity. better with rest.   - Associated s/s: diffuse generalized weakness of bilateral lower extremities (thinks its related to myasthenia gravis), leg pain  - Denies: numbness, tingling, upper extremity weakness, changes in bowel or bladder, changes in gait  ???  # Myasthenia gravis  - hospitalized 11/2018 for exacerbation, receiving IVIG infusions regularly  - uses a walker / cane sometimes  ???  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 Husband died 2 years ago of CHF.   BM: intermittent constipation, diarrhea   Temperature: tends to run cold  Supplements: none  __________________________________________________    Follow-up 05/21/19  - feels the treatments last visit did help reduce her pain temporarily   - still with persistent neck pain and left knee weakness  - got neck and shoulder massager and using it at home  - purchased TENS unit but forgot to bring it today  - [place]    Follow-up 06/12/19  - treatments help temporarily with the neck pain  - using shiatsu neck massager  - has TENS unit, wondering where to place     Follow-up 07/03/19  - neck pain and left knee pain persists   - overall doesn't feel well, wondering if stopping her mestinon contributed, had upcoming neurology appointment  - brought TENS unit in today, was able to get it to work, but wondered where she should put the pads    Follow-up 07/17/19  - neck pain persistent, but treatments help temporarily  - saw neuromuscular, ordered sleep study, had IVIG yesterday with improvement of symptoms   - using TENS unit for neck and heating pad, with some help    Follow-up 07/31/19  - neck and shoulders doing ok  - left lateral thigh with increased myofascial pain, as well as left knee has been bothering her more    Follow-up 08/21/19  - IVIG helping, felt the best in a while this past Thursday   -  jaw tightness acting up, biting mouth, has TMJ syndrome   - left IT band still with pain, neck and shoulder doing better, treatments helping     Follow-up 09/18/19   - neck a lot better, but still having jaw clenching and grinding  - left knee bothering her   - dog fell off bed and needed surgery     Follow-up 10/02/2019  - treatments temporarily helping  - still with bilateral neck, bilateral jaw, and left more than right knee pain today   - she has been watching the Olympics  - also staying in as pandemic with more cases        Medications that the patient states to be currently taking Medication Sig   ??? Albuterol Sulfate AEPB Takes 1 to 2 puffs every 4 hours as needed for wheezing or SOB Inhale Takes 1 to 2 puffs every 4 hours as needed for wheezing or SOB .Marland Kitchen   ??? azaTHIOprine 50 mg tablet Take 1.5 tablets (75 mg total) by mouth two (2) times daily.   ??? buPROPion, SR, (WELLBUTRIN SR) 200 mg 12 hr tablet Take 1 tablet (200 mg total) by mouth two (2) times daily.   ??? clonazePAM 1 mg disintegrating tablet Take 1 tablet (1 mg total) by mouth at bedtime. Max Daily Amount: 1 mg   ??? clonazePAM 1 mg tablet Take 1 tablet (1 mg total) by mouth three (3) times daily. Max Daily Amount: 3 mg (Patient taking differently: Take 1 mg by mouth at bedtime duplicate.)   ??? dexlansoprazole 60 mg DR capsule Take 1 capsule (60 mg total) by mouth daily.   ??? DULoxetine 20 mg DR capsule Take 1 capsule (20 mg total) by mouth daily.   ??? fluticasone-vilanterol (BREO ELLIPTA) 100-25 mcg/inh inhaler Inhale 1 puff daily.   ??? GAMMAGARD 30 GM/300ML SOLN every twenty one (21) days .   ??? lidocaine 5% patch Place 1 patch onto the skin every twelve (12) hours 12 hours on 12 hours off.Marland Kitchen   ??? montelukast 10 mg tablet Take 10 mg by mouth as needed for.   ??? NIFEdipine (PROCARDIA XL) 30 mg 24 hr tablet Take 1 tablet (30 mg total) by mouth daily.   ??? pyridostigmine 60 mg tablet Take 1 tablet (60 mg total) by mouth two (2) times daily.   ??? trimethoprim 100 mg tablet Take 1 tablet (100 mg total) by mouth daily.   No past medical history on file.      Objective:   BP 165/84  ~ Pulse 94  ~ Temp 37.2 ???C (99 ???F) (Oral)     Physical Exam:  General: alert, appears stated age and cooperative  Eyes: conjunctiva and lids normal  ENMT: normal external nose and ears.  normal oropharynx  Lungs:normal respiratory effort.  no cyanosis.  Extremities:extremities normal, atraumatic, no cyanosis or edema  Skin: warm and dry, no rashes.  Psych: alert and oriented to person, place and time  MSK: tenderness of below muscle groups     Trigger/ Tender Points Identification:   No images are attached to the encounter.      Assessment and Plan   [name] is a 84 y.o. y.o. female who presents with   1. Cervicalgia  Acupuncture    INJECT TRIGGER POINTS, > 3   2. Cervical myofascial strain, sequela  Acupuncture    INJECT TRIGGER POINTS, > 3   3. Myofascial pain  Acupuncture    INJECT TRIGGER POINTS, > 3   4.  TMJ syndrome       # Chronic neck pain  # Cervical myofascial pain  Temporary improvement with treatments, overall stable but persistent. Patient with chronic neck pain for years, underlying myasthenia gravis, and MRI with multilevel degenerative changes and neural foraminal narrowing. Exam reveals multiple trigger points suggesting significant myofascial component to pain.   - repeat acupuncture today  - repeat trigger point injections today   - continue TENS unit use  - continue neck and heat shiatsu massage device   - gentle neck stretching. Discussed community massage.       # Chronic left knee pain  - acupuncture done today     # TMJ syndrome  - TPIs done today  ???  # Myasthenia gravis   - Qi tonic foods   - can try American ginseng tea  - follow-up with neurology, receiving IVIG     Treatments Provided Today:     Acupuncture point interventions:    LI-4/Lv-3, Sp-6/St-36   Sp-9, Sp-10, St-35       GB-34   Heding, Xiyan     Acupuncture Treatment Duration: 20 Minutes  Injection of : Trigger Points with 0.46mL 1% lidocaine      Trapezius, Sternocleidomastoid Levator scapulae, Rhomboids       masseter        Trigger Point Injection Procedure Note   Procedure: Trigger point injection of Trapezius, Sternocleidomastoid Levator scapulae, Rhomboids       masseter      Indication: Myofascial pain; pain or altered sensation in the expected distribution of referred pain from a trigger point; taut band palpable in an accessible muscle with exquisite tenderness at one point along the length of it; some degree of restricted motion when measurable; reproduction of pain by stimulating trigger point  Consent: The risks (infection, bleeding, pain) and benefits of trigger points injections were explained to the patient and alternatives were discussed. All questions were answered to the patient's satisfaction. The patient verbalized understanding and consented to the trigger point injection(s).  Sterilization: The target area of the active trigger point(s) was sterilized using a 70% isopropyl alcohol pad.   Procedure: Manual palpation was used to identify the target area. Using a 1-inch 23 gauge and/or 0.5-inch 25 gauge needle, the following procedure was completed:   Injection of : Trigger Points with 0.17mL 1% lidocaine   into Trapezius, Sternocleidomastoid Levator scapulae, Rhomboids       masseter      The needle was infiltrated 1-3 cm in a fanwise method into each trigger point location. Manual pressure was applied after the procedure. There was no significant blood loss. The patient tolerated the procedure well.    Follow-up     Return in about 3 weeks (around 10/23/2019) for Blake Divine or acupuncturist / K Hui or Jones Bales.    Future Appointments   Date Time Provider Department Center   10/05/2019 10:30 AM Alvina Chou, AuD MP2 AUD AUDIOLOGY/SP   10/08/2019  4:20 PM York Ram., MD GERI IMS 420 MEDICINE   10/20/2019 10:30 AM NEU MG CLINIC [facility] NEUMUSC B200 NEUROLOGY   10/22/2019  2:30 PM Trenda Moots., MD ESTWS [id number] MEDICINE   11/12/2019  4:30 PM Trenda Moots., MD ESTWS [id number] MEDICINE   05/03/2020  1:30 PM Sherilyn Banker, OD DS VIS REHAB OPH     Seen and discussed with Dr. Kathalene Frames    Author: Will Bonnet, MD, 10/02/2019, 4:58 PM   [facility] Fellow and  Clinical Instructor    Patient was seen and examined with the fellow Dr. Will Bonnet. I agree with the findings/assessment/plan we formulated together as documented above.    Kathalene Frames MD MPH  [facility] Medicine  [facility]  Pgr [phone]  10/02/2019 4:58 PM

## 2019-10-02 ENCOUNTER — Ambulatory Visit: Payer: Commercial Managed Care - Pharmacy Benefit Manager

## 2019-10-02 NOTE — Patient Instructions
American Ginseng Tea (made in [place])  Recommended to help increase energy for select people.    Brew in thermos or cup of hot water.   Drink 2-3 times a day. Avoid drinking in the late afternoon or evening to avoid interfering with sleep. Use new tea bag each day.    [place]      [place]

## 2019-10-03 ENCOUNTER — Ambulatory Visit: Payer: BLUE CROSS/BLUE SHIELD

## 2019-10-03 NOTE — Consults
PRECHART ***     Adult Hearing Aid Fitting    [name] was fitted with a hearing aid for the right ear. [name] fitted with a BiCROS device on 05/23/2019 and returned the device on 07/04/2019 due to lack of benefit.     Hearing Aid(s)  Manufacturer: Phonak  Model: [place]o P70  Right SN: [id number]  Speaker Length & Power: 2S  Dome Size: Large vented  Battery Size: 312  Initial HAF: 07/22/2019  Hearing Aid Warranty Expiration: 10/06/2022  Right to Return Expiration Date: 09/05/2019    Accessories:   TV connector   SN: [id number]  Warranty: n/a; received as part of promotion     Patient reports:    ***    Actions taken:   {Outcomes:304973}    Follow-up Recommendations:  {Follow-up:304971}      Lilyan Gilford, AuD,    Audiologist     Education:  Topic: {audedutopic1:31062}  Person Taught: {patient/wife/etc:31877}  Barrier: {Barrier:31058}  Needs: {Needs:31059}  Teaching Method: {Teaching Method:24200}    Outcome: {Outcome:31060}

## 2019-10-05 ENCOUNTER — Telehealth: Payer: BLUE CROSS/BLUE SHIELD

## 2019-10-05 ENCOUNTER — Non-Acute Institutional Stay: Payer: BLUE CROSS/BLUE SHIELD | Attending: Audiologist

## 2019-10-05 NOTE — Telephone Encounter
VM Received 8/8 @ 11:55p: Patient cxld appt via vm, call back to reschedule.

## 2019-10-08 ENCOUNTER — Telehealth: Payer: BLUE CROSS/BLUE SHIELD

## 2019-10-08 ENCOUNTER — Ambulatory Visit: Payer: Commercial Managed Care - Pharmacy Benefit Manager | Attending: Geriatric Medicine

## 2019-10-08 MED ORDER — LOSARTAN POTASSIUM 25 MG PO TABS
25 mg | ORAL_TABLET | Freq: Every day | ORAL | 3 refills | Status: AC
Start: 2019-10-08 — End: ?

## 2019-10-08 MED ORDER — LORATADINE 10 MG PO TABS
10 mg | ORAL_TABLET | Freq: Every day | ORAL | 3 refills | Status: AC | PRN
Start: 2019-10-08 — End: ?

## 2019-10-08 NOTE — Progress Notes
OUTPATIENT GERIATRICS CLINIC NOTE    PATIENT:  [name]   MRN:  [id number]  DOB:  17-Jul-1935  DATE OF SERVICE:  10/08/2019  PRIMARY CARE PHYSICIAN: York Ram., MD    CHIEF COMPLAINT:   Chief Complaint   Patient presents with   ??? Shortness of Breath     Follow up       [place] [facility]:  [place]    [facility] [facility]:    Chaperone status:  No data recorded    HISTORY OF PRESENT ILLNESS     [name] is a 84 y.o. female who presents today for *follow up**. Patient is accompanied by *no one**. History today is per the patient,and review of available recent records in [facility] and Care Everywhere.     Patient is a(n) []  reliable []  unreliable historian and additional collateral information was obtained during the visit today from:     Bothered by sinus fullness.  Some more dry mouth with duloxetine.    Per chart review, pertinent medical history:  No past medical history on file.  No past surgical history on file.    ALLERGIES     Allergies   Allergen Reactions   ??? Beta Adrenergic Blockers Other (See Comments)     Avoid d/t Myasthenia Gravis  Avoid d/t Myasthenia Gravis     ??? Levofloxacin Other (See Comments)     Flare of Myasthenia Gravis   ??? Macrolides And Ketolides Other (See Comments)     Avoid d/t Myasthenia Gravis  Use with caution d/t Myasthenia Gravis  Use with caution d/t Myasthenia Gravis     ??? Sulfa Antibiotics Other (See Comments)     May possibly have caused deafness in one ear  May possibly have caused deafness in one ear  other     ??? Botulinum Toxins Other (See Comments)     Muscle weakness r/t Myasthenia Gravis  Muscle weakness r/t Myasthenia Gravis     ??? Statins Other (See Comments)     Muscle aches  Muscle aches  Muscle aches     ??? Plastic Tape Itching and Other (See Comments)     Turns the skin red where applied        MEDICATIONS     Personally reviewed.    Medications that the patient states to be currently taking   Medication Sig   ??? Albuterol Sulfate AEPB Takes 1 to 2 puffs every 4 hours as needed for wheezing or SOB Inhale Takes 1 to 2 puffs every 4 hours as needed for wheezing or SOB .Marland Kitchen   ??? azaTHIOprine 50 mg tablet Take 1.5 tablets (75 mg total) by mouth two (2) times daily.   ??? buPROPion, SR, (WELLBUTRIN SR) 200 mg 12 hr tablet Take 1 tablet (200 mg total) by mouth two (2) times daily.   ??? clonazePAM 1 mg disintegrating tablet Take 1 tablet (1 mg total) by mouth at bedtime. Max Daily Amount: 1 mg   ??? clonazePAM 1 mg tablet Take 1 tablet (1 mg total) by mouth three (3) times daily. Max Daily Amount: 3 mg (Patient taking differently: Take 1 mg by mouth at bedtime duplicate.)   ??? dexlansoprazole 60 mg DR capsule Take 1 capsule (60 mg total) by mouth daily.   ??? DULoxetine 20 mg DR capsule Take 1 capsule (20 mg total) by mouth daily.   ??? fluticasone-vilanterol (BREO ELLIPTA) 100-25 mcg/inh inhaler Inhale 1 puff daily.   ??? GAMMAGARD 30 GM/300ML SOLN every twenty  one (21) days .   ??? lidocaine 5% patch Place 1 patch onto the skin every twelve (12) hours 12 hours on 12 hours off.Marland Kitchen   ??? montelukast 10 mg tablet Take 10 mg by mouth as needed for.   ??? NIFEdipine (PROCARDIA XL) 30 mg 24 hr tablet Take 1 tablet (30 mg total) by mouth daily.   ??? pyridostigmine 60 mg tablet Take 1 tablet (60 mg total) by mouth two (2) times daily.   ??? trimethoprim 100 mg tablet Take 1 tablet (100 mg total) by mouth daily.       SOCIAL HISTORY     Social History     Social History Narrative   ??? Not on file        FUNCTIONAL STATUS     BADLs:  IADLs:    Ambulates with   Falls in past year:  Afraid of falling:    GERIATRIC REVIEW OF SYSTEMS     Vision:    []   glasses     []  legally blind  Hearing: []  hearing aids    Nutrition: []  normal   []  impaired  []  vegan  []  vegetarian  []  low salt  []  low-carb   Swallowing: []  impaired   Dentures:   []  yes    Depression:  []  yes   Cognition:     Incontinence: [] urine  []  fecal  []  urine and fecal      ADVANCED CARE PLANNING     Advanced directives on file: []  No  []   Yes ??? Completed:     Medical DPOA on file:   []   Yes ???   []   No ??? Patient designates ** * to be their surrogate medical decision-maker.         HEALTH CARE MAINTENANCE     IMMUNIZATIONS:   Immunization History   Administered Date(s) Administered   ??? COVID-19, mRNA, LNP-S, PF, 30 mcg/0.3 mL Dose (Pfizer-BioNTech) 03/11/2019, 04/01/2019   ??? DTaP 12/31/2015   ??? influenza vaccine IM quadrivalent (Fluzone Quad) MDV (34 months of age and older) 11/26/2016   ??? influenza vaccine IM quadrivalent adjuvanted (FluAD Quad) (PF) SYR (31 years of age and older) 11/05/2018   ??? influenza, unspecified formulation 04/01/2019, 04/01/2019   ??? pneumococcal polysaccharide vaccine 23-valent (Pneumovax) 01/10/2019       Mammogram:  DXA:  PAP:  Colonoscopy:      PHYSICAL EXAM     BP 176/79  ~ Pulse (!) 117  ~ Temp 36.2 ???C (97.1 ???F) (Forehead)  ~ Resp 18  ~ Ht 5' 5'' (1.651 m)  ~ Wt 145 lb 11.2 oz (66.1 kg)  ~ SpO2 95%  ~ BMI 24.25 kg/m???   Wt Readings from Last 3 Encounters:   10/08/19 145 lb 11.2 oz (66.1 kg)   09/22/19 146 lb (66.2 kg)   09/09/19 144 lb (65.3 kg)         System Check if normal Positive or additional negative findings   GEN  [x]  NAD     Eyes  []  Conj/Lids []  Pupils  []  Fundi   []  Sclerae []  EOM     ENT  []  External ears   []  Otoscopy   []  Gross Hearing    []   External nose   []  Nasal mucosa   []  Lips/teeth/gums    []  Oropharynx    []  Mucus membranes      Neck  [x]  Inspection/palpation    [x]  Thyroid     Resp  [  x] Effort    [x]  Auscultation       CV  []  Rhythm/rate   [x]  Murmurs   [x]  Edema   []  JVP non-elevated    Normal pulses:   []  Radial []  Femoral  []  Pedal     Breast  []  Inspection []  Palpation     GI  []  Bowel sounds    [x]  Nontender   [x]  No distension    []  No rebound or guarding   []  No masses   []  Liver/spleen    []  Rectal     GU  F:  []  External []  vaginal wall         []  Cervix     []  mucus        []  Uterus    []  Adnexa   M:  []  Scrotum []  Penis         []  Prostate     Lymph  [x]  Cervical []  supraclavicular     [x]  Axillae   []  Groin/inguinal     MSK []  Gait  []  Back     Specify site examined:    []  Inspect/palp []  ROM   []  Stability []  Strength/tone []  Used arms to push up from seated to standing position   Assistive device:  []  single point cane []  quad cane  []  FWW []  rollator walker      Skin  []  Inspection []  Palpation     Neuro  []  Alert and oriented     []  CN2-12 intact grossly   []  DTR      []  Muscle strength      []  Sensation   []  Pronator drift   []  Finger to Nose/Heel to Shin   []  Romberg     Psych  []  Insight/judgement     []  Mood/affect    []  Gross cognition            LABS/STUDIES     LABS:  Lab Results   Component Value Date    WBC 4.60 09/22/2019    WBC 4.35 07/14/2019    WBC 4.40 04/14/2019    HGB 12.3 09/22/2019    HGB 12.7 07/14/2019    HGB 13.0 04/14/2019    MCV 100.0 (H) 09/22/2019    PLT 342 09/22/2019    PLT 323 07/14/2019    PLT 314 04/14/2019     Lab Results   Component Value Date    NA 139 09/22/2019    NA 140 04/14/2019    NA 141 12/17/2018    K 4.1 09/22/2019    K 4.5 04/14/2019    K 4.1 12/17/2018    CREAT 1.10 09/22/2019    CREAT 1.05 04/14/2019    CREAT 1.13 12/17/2018    GFRESTNOAA 47 09/22/2019    GFRESTNOAA 50 04/14/2019    GFRESTNOAA 46 12/17/2018    GFRESTAA 54 09/22/2019    GFRESTAA 58 04/14/2019    GFRESTAA 53 12/17/2018    CALCIUM 9.0 09/22/2019    CALCIUM 9.4 04/14/2019    CALCIUM 9.8 12/17/2018     Lab Results   Component Value Date    ALT 13 09/22/2019    ALT 14 04/14/2019    AST 25 09/22/2019    AST 21 04/14/2019    ALKPHOS 80 09/22/2019    BILITOT 0.3 09/22/2019    ALBUMIN 3.7 (L) 09/22/2019    ALBUMIN 3.9 04/14/2019     Lab Results   Component Value Date    TSH  1.00 12/17/2018     Lab Results   Component Value Date    HGBA1C 4.9 09/22/2019     No results found for: CHOL, CHOLDLCAL  No results found for: CHOLDLQ  No results found for: [facility]  Lab Results   Component Value Date    FE 87 07/14/2019    FERRITIN 57 07/14/2019    FOLATE 11.0 09/22/2019    TIBC 356 07/14/2019     Lab Results   Component Value Date    VITAMINB12 217 (L) 09/22/2019     Lab Results   Component Value Date    BNP 92 09/22/2019     No results found for: PSATOTAL      STUDIES:    EKG  This data element was independently reviewed and interpreted by me     NSR NO ST or T changes    XR CHEST PA LAT 2V  September 22, 2019???  COMPARISON: KUB December 17, 2018  ???  History: sob  ???  FINDINGS:  ???  Lungs: Clear  Heart/aorta: Normal heart size. The thoracic aorta is mildly calcified, tortuous and ectatic.  Adenopathy: None  Pleura: No effusion  Bones and Chest wall: No acute bony or body wall  findings. Osteopenia and bony maturational changes. Right upper quadrant cholecystectomy clips stable from October 2020  ???  ???  IMPRESSION:  ???  No acute findings or abnormalities related to provided history.    ASSESSMENT and PLAN     [name] is a 84 y.o. female who presents today for *follow up**.      #SOB--low peak flow. Suspect COPD flare.  No oral steroids due to prior skin sores.  Trial breo, augmentin (due to MG),  Check CBC, BNP, CXR.  May need additional allergy treatment.  #HTN--trial nifedipine CR 30 mg daily. Add losartan 25 mg.  REcheck BMP in a few weeks.  #Aortic Calcification--noted on chest imaging     September 22, 2019???       --Not Worsening.  Chronic.  Will monitor and treat underlying risk factors with medical and lifestyle interventions as warranted by balance between benefits and burdens.    #Macrocytosis--check B12, folic acid at some point  #Deconditioning--initiate home PT  #GERD--s/p Nisan fundoplication  #COPD--mixed--per PFT's from [name].--consider reinitiation of inhalers.  #s/p rectocele repair  #s/p cholecystectomy  #Nocturnal hypoxemia--presumably related to MG crisis but has history of COPD. ???Will order nocturnal home O2 study and consider PFT's/Pulmonary evaluation once settled down. ???May need to restart BREO inhaler.  #Myasthenia Gravis--s/p plasmapheresis x5 October 2020, no Thymoma, on Azathioprine 150 daily and mestinon 60 tid.??????Saw???Dr. Enedina Finner at [facility]. ???Was considering Soliris.??????Ordering IVIG and MRI.  Encourage ongoing neurology follow up.   #IBS--titrate miralax to comfort. ???Consider duloxetine substitute for buproprion.  #Interstitial cystitis--on daily Trimethoprim 100 for UTI prophylaxis. ???Using bladder instillations prn. OFF topical estrogen due to advice from neurologist re: MG.  To see Dr. Desma Maxim to see if she has additional insights.  #Major Depression--active.  Referred patient to Dr. Randie Heinz.  #Allergic rhinitis--previously on nasal steroids.  #Breast CA--DCIS???by report--obtain mammo. ???Patient declined surgery in 2019 and experienced dry mouth from Tamoxifen. ???Was considering Anastrozole. Await records from prior mammogram and treatment.  Per records from [facility]:   ''07/20/2017 Screening detected right breast calcifications and distortion. The distortion was in the upper outer quadrant: Biopsy DCIS intermediate grade with CSL; calcifications UIQ: 2.8 cm: Biopsy DCIS+ ALH +CSL; 4 cm apart, axilla negative, Tis NX stage  0''  Due for repeat mammo Apirl 2022  #DM with CKD--stabe 3b--based on labs from [name]. ???FOllow here. ???If BP permits, may consider ARB BUT patient with h/o angioedema so will ONLY initiate this agent with great caution.  Refer to Ophthalmology.  #OA knees--previously getting hyaluronic acid injections.  #Anxiety  #Benzodiazepine dependence--will work to wean over time.  #Fibromyalgia--duloxetine as above. 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  #Polypharmacy--add duloxetine 20  #Statin intolerance      FOLLOW-UP     RTC     Future Appointments   Date Time Provider Department Center   10/20/2019 10:30 AM NEU MG CLINIC [facility] NEUMUSC B200 NEUROLOGY   10/22/2019  2:30 PM Trenda Moots., MD [facility] [phone] MEDICINE   11/12/2019  4:30 PM Trenda Moots., MD [facility] [phone] MEDICINE   05/03/2020  1:30 PM Shoku, Drue Flirt, OD [facility]         The above plan of care, diagnosis, orders, and follow-up were discussed with the patient and/or surrogate. Questions related to this recommended plan of care were answered.    ANALYSIS OF DATA (Needs to meet 1 category for moderate and 2 categories for high LOS)     I have:     Category 1 (Needs 3 for moderate and high LOS)     []  Reviewed []  1 []  2 []  ? 3 unique laboratory, radiology, and/or diagnostic tests noted below    Test/Study:  on date .    []  Reviewed []  1 []  2 []  ? 3 prior external notes and incorporated into patient assessment    I reviewed Dr. 's note in specialty  from date .    []  Discussed management or test interpretation with external provider(s) as noted      []  Ordered []  1 []  2 []  ? 3 unique laboratory, radiology, and/or diagnostic tests noted in A&P    []  Obtained history from independent historian:       Category 2  []  Independently interpreted the test    Category 3  []  Discussed management or test interpretation with external provider(s) as noted      INTERACTION COMPLEXITY and SOCIAL DETERMINANTS of HEALTH       PROBLEM COMPLEXITY   []  New problem with uncertain diagnosis or prognosis (moderate)   []  Multiple stable chronic problems (moderate)   []  Chronic problem not stable - not controlled, symptomatic, or worsening (moderate)   []  Severe exacerbation of chronic problem (high)   []  New or chronic problem that poses threat to life or bodily function (high)     MANAGEMENT COMPLEXITY   []  Old or external/outside records reviewed   []  Discussion with alternate (proxy) if patient with impaired communication / comprehension ability (e.g., dementia, aphasia, severe hearing loss).   []  Repeated questions (or disagreement) between patient and/among caregivers/family during the visit.  []  Caregiver/patient emotions/behavior/beliefs interfering with implementation of treatment plan.  []  Independent interpretation of test (EKG, Chest XRay)   []  Discussion of case with a consultant physician     RISK LEVEL   []  Prescription drug management (moderate)   []  Minor surgery with CV risk factors or elective major surgery (moderate)   []  Dx or Rx significantly limited by SDoH (inadequate housing, living alone, poor health care access, inappropriate diet; low literacy) (moderate)   []  Major surgery - elective with CV risk factors or emergent (high)   []  Need for hospitalization (high)   []  New DNR  or de-escalation of care (high)      SDoH  The diagnosis or treatment of said conditions is significantly limited by the following social determinants of health:  []  Z59.0 Homelessness  []  Z59.1 Inadequate housing  []  Z59.2 Discord with neighbors, lodgers and landlord  []  Z60.2 Problems related to living alone  []  Z59.8 Other problems related to housing and economic circumstances  []  Z59.4 Lack of adequate food and safe drinking water  []  Z59.6 Low income  []  Z59.7 Insufficient social insurance and welfare support  []  Z59.9 Problems related to housing and economic circumstances, unspecified  []  Z75.3 Unavailability and inaccessibility of health care facilities  []  Z75.4 Unavailability and inaccessibility of other helping agencies  []  Z72.4 Inappropriate diet and eating habits  []  Z62.820 Parent-biological child conflict  []  Z63.8 Other specified problems related to primary support group  []  Z55.0 Illiteracy and low level literacy   []  Z56.9 Unspecified problems related to employment        If Billing Based on Time:     I performed the following items on the day of service:    [x]  Preparing to see the patient (e.g., review of tests)  [x]  Obtaining and/or reviewing separately obtained history   [x]  Performing a medically appropriate examination and/or evaluation   [x]  Counseling and educating the patient/family/caregiver   [x]  Ordering medications, tests, or procedures  [x]  Referring and communicating with other healthcare professionals (when not separately reported)  [x]  Documenting clinical information in the EHR  []  Independently interpreting results and communicating results to patient/family/caregiver    I spent the following total amount of time on these tasks on the day of service:  New Patient     Established Patient  []  15-29 minutes ??? [id number]    []  up to 9 minutes ??? [id number]  []  30-44 minutes ??? [id number]     []  10-19 minutes ??? [id number]   []  45-59 minutes ??? [id number]      []  20-29 minutes ??? [id number]   []  60-74 minutes ??? [id number]   []  30-39 minutes ??? [id number]         [x]  40-55 minutes ??? [id number]    []  I spent an additional ** * 15-minute-increment(s) for a total of * ** minutes on these tasks on the day of service. [phone] for each additional 15 minutes.)    Author: York Ram, MD 10/08/2019

## 2019-10-08 NOTE — Telephone Encounter
[place]  Can you call her and give her the number for the [employer].

## 2019-10-09 NOTE — Telephone Encounter
Reply by:  [place]         Called and left patient a vm.     

## 2019-10-10 DIAGNOSIS — I10 Essential (primary) hypertension: Secondary | ICD-10-CM

## 2019-10-11 ENCOUNTER — Ambulatory Visit: Payer: MEDICARE

## 2019-10-13 ENCOUNTER — Ambulatory Visit: Payer: BLUE CROSS/BLUE SHIELD

## 2019-10-13 NOTE — Telephone Encounter
Please advise on message below.

## 2019-10-14 ENCOUNTER — Telehealth: Payer: BLUE CROSS/BLUE SHIELD

## 2019-10-14 MED ORDER — AMOXICILLIN-POT CLAVULANATE 875-125 MG PO TABS
1 | ORAL_TABLET | Freq: Two times a day (BID) | ORAL | 0 refills | Status: AC
Start: 2019-10-14 — End: ?

## 2019-10-14 NOTE — Telephone Encounter
vm was left for patient to call back and r/s apt for 9/16 with Dr. Mindi Slicker advised in vm that apt will be canceled.

## 2019-10-20 ENCOUNTER — Ambulatory Visit: Payer: BLUE CROSS/BLUE SHIELD | Attending: Neurology

## 2019-10-20 DIAGNOSIS — G7000 Myasthenia gravis without (acute) exacerbation: Secondary | ICD-10-CM

## 2019-10-20 DIAGNOSIS — G2581 Restless legs syndrome: Secondary | ICD-10-CM

## 2019-10-20 NOTE — Progress Notes
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     Interval Events:    Last visit 07/14/19  At last visit, patient reported that she was progressively getting weaker, and felt that she was getting exhausted faster. She estimated that she was walking 2 blocks before feeling very fatigued, and was previously able to walk 4 blocks. Her IVIG was changed to q2 weeks from q3 weeks. A sleep study was also ordered to evaluate for OSA vs. Restless legs syndrome.     Today, patient states that she has not had much improvement in her symptoms since IVIG was changed. She has not worsened, though. She still is able to walk about 2 blocks. She tried to do physical therapy, but was not able to tolerate it due to pain. She continues to follow up with [facility] for trigger point injections, which help her pain. Lab workup revealed vit B12 deficiency at 217, and she states she was started on supplementation 2 weeks ago. She continues to report restless leg symptoms when trying to sleep. Patient also reports a sleep study that she had about 1 year ago in [name], which showed evidence of sleep apnea. Patient was prescribed a CPAP machine, but was unable to tolerate it.     Current Regimen:  Imuran 75 mg BID  Mestinon 60 mg BID  IVIG 60 g q2w     Previously Failed  Prednisone - oral ulcers    Past Medical History:  Fibromyalgia    Breast cancer (2017) - contained, no tx   Interstitial cystitis x 30 years   Fall with brief LOC (09/2016)  Fall with knee infection/contusion (10/2018)  Depression and anxiety   Diverticulitis COPD  Asthma     Past Surgical History:  Meniscus tear repair in left knee    Family History:   Adopted at birth. Birth mother may have had breast cancer.   Brother(58) and sister (40) were both adopted from different families    Social History:   She moved from her independent living place in [place], [place] to [place] with her daugther.   Pt  reports that she quit smoking about 36 years ago. She has quit using smokeless tobacco. Smoked 0.5 PPD from 1960 -1985. Denies EtOH or recreational drug use. Retired, college-educated, widowed.     Allergies:  Allergies   Allergen Reactions   ??? Beta Adrenergic Blockers Other (See Comments)     Avoid d/t Myasthenia Gravis  Avoid d/t Myasthenia Gravis     ??? Levofloxacin Other (See Comments)     Flare of Myasthenia Gravis   ??? Macrolides And Ketolides Other (See Comments)     Avoid d/t Myasthenia Gravis  Use with caution d/t Myasthenia Gravis  Use with caution d/t Myasthenia Gravis     ??? Sulfa Antibiotics Other (See Comments)     May possibly have caused deafness in one ear  May possibly have caused deafness in one ear  other     ??? Botulinum Toxins Other (See Comments)     Muscle weakness r/t Myasthenia Gravis  Muscle weakness r/t Myasthenia  Gravis     ??? Statins Other (See Comments)     Muscle aches  Muscle aches  Muscle aches     ??? Plastic Tape Itching and Other (See Comments)     Turns the skin red where applied        Medications:     Outpatient Medications Prior to Visit   Medication Sig   ??? Albuterol Sulfate AEPB Takes 1 to 2 puffs every 4 hours as needed for wheezing or SOB Inhale Takes 1 to 2 puffs every 4 hours as needed for wheezing or SOB .Marland Kitchen   ??? amoxicillin-clavulanate 875-125 mg tablet Take 1 tablet by mouth two (2) times daily for 10 days.   ??? azaTHIOprine 50 mg tablet Take 1.5 tablets (75 mg total) by mouth two (2) times daily.   ??? buPROPion, SR, (WELLBUTRIN SR) 200 mg 12 hr tablet Take 1 tablet (200 mg total) by mouth two (2) times daily.   ??? clonazePAM 1 mg disintegrating tablet Take 1 tablet (1 mg total) by mouth at bedtime. Max Daily Amount: 1 mg   ??? clonazePAM 1 mg tablet Take 1 tablet (1 mg total) by mouth three (3) times daily. Max Daily Amount: 3 mg (Patient taking differently: Take 1 mg by mouth at bedtime duplicate.)   ??? dexlansoprazole 60 mg DR capsule Take 1 capsule (60 mg total) by mouth daily.   ??? DULoxetine 20 mg DR capsule Take 1 capsule (20 mg total) by mouth daily.   ??? fluticasone-vilanterol (BREO ELLIPTA) 100-25 mcg/inh inhaler Inhale 1 puff daily.   ??? GAMMAGARD 30 GM/300ML SOLN every twenty one (21) days .   ??? lidocaine 5% patch Place 1 patch onto the skin every twelve (12) hours 12 hours on 12 hours off.Marland Kitchen   ??? loratadine 10 mg tablet Take 1 tablet (10 mg total) by mouth daily as needed for Allergies.   ??? losartan 25 mg tablet Take 1 tablet (25 mg total) by mouth daily.   ??? montelukast 10 mg tablet Take 10 mg by mouth as needed for.   ??? NIFEdipine (PROCARDIA XL) 30 mg 24 hr tablet Take 1 tablet (30 mg total) by mouth daily.   ??? pyridostigmine 60 mg tablet Take 1 tablet (60 mg total) by mouth two (2) times daily.   ??? trimethoprim 100 mg tablet Take 1 tablet (100 mg total) by mouth daily.     No facility-administered medications prior to visit.          Physical Exam  24 hour vitals:    BP 176/78  ~ Pulse 96  ~ Temp 36.3 ???C (97.3 ???F)  ~ Ht 5' 5'' (1.651 m)  ~ Wt 145 lb (65.8 kg)  ~ BMI 24.13 kg/m???        Gen: NAD, well-appearing, cooperative, appears younger than stated age.  Resp:  Able to count to 18 on a single breath.     Neurological Exam  Mental Status  Awake, alert and oriented to person, place and time. Recent and remote memory are intact. Mild dysarthria present. Language is fluent with no aphasia. Attention and concentration are normal. Fund of knowledge is appropriate for level of education.    Cranial Nerves  CN II: Visual fields full to confrontation. Blurry vision but no double vision.  CN III, IV, VI: Extraocular movements intact bilaterally.  CN V: Facial sensation is normal.  CN VII: Full and symmetric facial movement.  CN VIII: Hearing is normal.  CN IX, X: Palate elevates  symmetrically. Normal gag reflex.  CN XI: Shoulder shrug strength is normal.  CN XII: Tongue midline without atrophy or fasciculations.  No left ptosis .    Motor  Normal muscle bulk throughout. No fasciculations present. Normal muscle tone.                                             Right                     Left  Neck flexion                           4-                          4-  Neck extension                      4-                          4-   Shoulder abduction               4-                          4-  Elbow flexion                         4-                          4-  Elbow extension                    4-                          4-  Wrist flexion                           4-                          4-  Wrist extension                      4-                          4-  Hip flexion                              4-                          4-  Knee extension                      4-                          4-  Plantarflexion  4                          4  Dorsiflexion                            4                          4    Sensory  Light touch is normal in upper and lower extremities.     Reflexes                                           Right                      Left  Brachioradialis                    3+                         3+  Biceps                                 2+                         2+  Triceps                                2+                         2+  Patellar                                3+                         3+  Achilles                                2+                         2+    Right pathological reflexes: Hoffmann's absent. Ankle clonus absent.  Left pathological reflexes: Hoffmann's absent. Ankle clonus absent.    Coordination  Mild bilateral intention tremors..    Gait  Casual gait: Unable to rise from chair without using arms.  Walks slowly with stooped posture and spatic left leg. .    Labs ???  Labs 08/26/2015: AChR binding (0.63) blocking (28) antibody, ESR 3, CRP 4.9    09/22/2019 11:42 AM    Component Value Ref Range & Units Status   Vitamin B12 217Low   254-1,060 pg/mL Final       Imaging and Diagnostics     DXA 06/17/2019    Impression: The patient has low bone mass, based on the Left Total Hip T-score. The patient has an estimated ten-year risk of hip fracture of 3% and an estimated ten-year risk of  major fracture of 13%, based on the [facility] FRAX algorithm.    Repetitive Nerve stimulation in office 12/23/2018: No decrement after stimulation of CN XI and left ulnar nerve     (from neurologist Dr. Eliane Decree note)    MRI/MRA head 09/09/2015:  This MR angiogram of the intracerebral arteries shows the following:  1. ??????Very minimal atherosclerotic change within the left posterior cerebral artery that is unlikely to be clinically significant.  2. ??????There is a normal variant with the left posterior cerebral artery obtaining its flow from the anterior circulation.  3. ?????????No aneurysms are seen.  4. ?????????No new findings compared to the 11/25/2003 MRA. ??????  ???  MRI orbit wwo contrast 09/09/2015: This is is a normal MRI of the orbits with and without contrast.    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 Uncovertebral joint osteophytes cause severe right C3-C4,   severe right and moderate left C5-C6 and severe left and mild right C6-C7 neural foraminal narrowing. Facet joint degeneration is severe on the left at C2-C3, the right at C3-C4 and C4-C5, and is moderate bilateral and C7-T1.     ASSESSMENT & PLAN:  Mrs. Shilling is a 84 years old female with a pmhx of seropositive initially ocular (2017) now generalized myasthenia gravis (AchR binding and blocking +), breast cancer, interstitial cystitis, fibromyalgia, COPD, asthma, 11/2018 with MG exacerbation s/p PLEX and found to have UTI, who is following up for management of MG. Endorses overall decline in function and increased fatigue. At this time there are multiple potential reasons for her increased fatigue and decreased endurance capacity. Question possible cervical myelopathy and generalized deconditioning playing a role in affecting her gait/endurance, and patient has just started home PT for this.  Additionally patient endorses increased day time fatigue with poor sleep and symptoms suggestive of restless leg syndrome. Workup with B12 deficiency, and patient was started on B12 supplementation. With regards to myasthenia gravis, patient's IVIG schedule was increased to q2 weeks from q3 weeks, as patient endorses increased frequency of developing dysarthria/dysphagia, blurry vision, gen weakness faster than before prior to her next infusion. At this point, it appears that her myasthenia is well-controlled, and her symptoms may be from poor sleep, normal aging, and fibromyalgia.    Plan   - Continue Azathioprine 75 mg BID   - Continue Mestinon BID  - Decrease frequency IVIg 60g (1g/kg) back to q3 weeks ([place]) and monitor response  - Sleep Study to evaluate for OSA vs Restless Leg syndrome  - Continue with Home PT     RTC in 4 months. Patient discussed with attending, Dr. Norberta Keens. Hemmati, MD  PM&R PGY IV    Note: greater than 30 minutes spent on this encounter in consultation and in coordination of care.    The patient was seen and examined with Dr. Rennie Natter, PM&R Resident. I was present for the key portions of the history and video examination. We formulated the assessment and plan together. I agree with the note above, which reflects the details of our evaluation.    Jake Shark, M.D. Ph.D.

## 2019-10-22 ENCOUNTER — Ambulatory Visit: Payer: BLUE CROSS/BLUE SHIELD

## 2019-10-22 DIAGNOSIS — M797 Fibromyalgia: Secondary | ICD-10-CM

## 2019-10-22 DIAGNOSIS — G7000 Myasthenia gravis without (acute) exacerbation: Secondary | ICD-10-CM

## 2019-10-22 DIAGNOSIS — M7918 Myalgia, other site: Secondary | ICD-10-CM

## 2019-10-22 DIAGNOSIS — F419 Anxiety disorder, unspecified: Secondary | ICD-10-CM

## 2019-10-22 DIAGNOSIS — M26629 Arthralgia of temporomandibular joint, unspecified side: Secondary | ICD-10-CM

## 2019-10-22 DIAGNOSIS — M542 Cervicalgia: Secondary | ICD-10-CM

## 2019-10-22 DIAGNOSIS — S161XXS Strain of muscle, fascia and tendon at neck level, sequela: Secondary | ICD-10-CM

## 2019-10-22 NOTE — Progress Notes
[facility] Progress Note     PATIENT: [name]  MRN: [id number]  DOB: 1935-03-25  DATE OF SERVICE: 10/22/2019  REFERRING PRACTITIONER: No ref. provider found  PRIMARY CARE PROVIDER: York Ram., MD    Chief Complaint   Patient presents with   ??? Neck Pain   ??? Knee Pain   ??? Urinary Tract Infection     Subjective:      History of Present Illness:  [name] is a 84 y.o. female with PMH of  has no past medical history on file. who presents for follow-up cervical and lumbar spondylosis.     Initial Consult 04/16/19  # Chronic neck and shoulder pain  - present for many years, gradual onset, no inciting event or trauma. Underlying myasthenia gravis.   - Prior work-up: MR cervical spine with multilevel degenerative disc disease in setting of C4-C7 congenital cervical spinal canal stenosis, neural foraminal narrowing, facet joint degeneration   - Interventions: no medications for pain; was going to start PT but didn't go due to pandemic  - Self-care: cervical collar sometimes if needed to bend down  - Timing/Duration: constant, associated with activities   - Location/Radiation: bilateral neck, no radiation  - Quality: aching, sharp at times  - Severity: varies between 5 to 10 out of 10  - Modifiers: worse with activity. better with rest.   - Associated s/s: diffuse generalized weakness of bilateral lower extremities (thinks its related to myasthenia gravis), leg pain  - Denies: numbness, tingling, upper extremity weakness, changes in bowel or bladder, changes in gait  ???  # Myasthenia gravis  - hospitalized 11/2018 for exacerbation, receiving IVIG infusions regularly  - uses a walker / cane sometimes  ???  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 Husband died 2 years ago of CHF.   BM: intermittent constipation, diarrhea   Temperature: tends to run cold  Supplements: none  __________________________________________________    Follow-up 05/21/19  - feels the treatments last visit did help reduce her pain temporarily   - still with persistent neck pain and left knee weakness  - got neck and shoulder massager and using it at home  - purchased TENS unit but forgot to bring it today  - [place]    Follow-up 06/12/19  - treatments help temporarily with the neck pain  - using shiatsu neck massager  - has TENS unit, wondering where to place     Follow-up 07/03/19  - neck pain and left knee pain persists   - overall doesn't feel well, wondering if stopping her mestinon contributed, had upcoming neurology appointment  - brought TENS unit in today, was able to get it to work, but wondered where she should put the pads    Follow-up 07/17/19  - neck pain persistent, but treatments help temporarily  - saw neuromuscular, ordered sleep study, had IVIG yesterday with improvement of symptoms   - using TENS unit for neck and heating pad, with some help    Follow-up 07/31/19  - neck and shoulders doing ok  - left lateral thigh with increased myofascial pain, as well as left knee has been bothering her more    Follow-up 08/21/19  - IVIG helping, felt the  best in a while this past Thursday   - jaw tightness acting up, biting mouth, has TMJ syndrome   - left IT band still with pain, neck and shoulder doing better, treatments helping     Follow-up 09/18/19   - neck a lot better, but still having jaw clenching and grinding  - left knee bothering her   - dog fell off bed and needed surgery     Follow-up 10/02/19  - treatments temporarily helping  - still with bilateral neck, bilateral jaw, and left more than right knee pain today   - she has been watching the Olympics  - also staying in as pandemic with more cases     Follow-up 10/22/19  - treatments overall helping, more with her neck pain  - left knee more bothersome today   - sleep study ordered by neuromuscular to evaluate for OSA and restless leg syndrome  - started taking American Ginseng tea, enjoys drinking it        Medications that the patient states to be currently taking   Medication Sig   ??? Albuterol Sulfate AEPB Takes 1 to 2 puffs every 4 hours as needed for wheezing or SOB Inhale Takes 1 to 2 puffs every 4 hours as needed for wheezing or SOB .Marland Kitchen   ??? amoxicillin-clavulanate 875-125 mg tablet Take 1 tablet by mouth two (2) times daily for 10 days.   ??? azaTHIOprine 50 mg tablet Take 1.5 tablets (75 mg total) by mouth two (2) times daily.   ??? buPROPion, SR, (WELLBUTRIN SR) 200 mg 12 hr tablet Take 1 tablet (200 mg total) by mouth two (2) times daily.   ??? clonazePAM 1 mg disintegrating tablet Take 1 tablet (1 mg total) by mouth at bedtime. Max Daily Amount: 1 mg   ??? clonazePAM 1 mg tablet Take 1 tablet (1 mg total) by mouth three (3) times daily. Max Daily Amount: 3 mg (Patient taking differently: Take 1 mg by mouth at bedtime duplicate.)   ??? dexlansoprazole 60 mg DR capsule Take 1 capsule (60 mg total) by mouth daily.   ??? DULoxetine 20 mg DR capsule Take 1 capsule (20 mg total) by mouth daily.   ??? fluticasone-vilanterol (BREO ELLIPTA) 100-25 mcg/inh inhaler Inhale 1 puff daily.   ??? GAMMAGARD 30 GM/300ML SOLN every twenty one (21) days .   ??? lidocaine 5% patch Place 1 patch onto the skin every twelve (12) hours 12 hours on 12 hours off.Marland Kitchen   ??? loratadine 10 mg tablet Take 1 tablet (10 mg total) by mouth daily as needed for Allergies.   ??? losartan 25 mg tablet Take 1 tablet (25 mg total) by mouth daily.   ??? montelukast 10 mg tablet Take 10 mg by mouth as needed for.   ??? NIFEdipine (PROCARDIA XL) 30 mg 24 hr tablet Take 1 tablet (30 mg total) by mouth daily.   ??? pyridostigmine 60 mg tablet Take 1 tablet (60 mg total) by mouth two (2) times daily.   ??? trimethoprim 100 mg tablet Take 1 tablet (100 mg total) by mouth daily.   No past medical history on file.      Objective:   BP (!) 180/77  ~ Pulse (!) 106  ~ Temp 36.6 ???C (97.8 ???F) (Oral)     Physical Exam:  General: alert, appears stated age and cooperative  Eyes: conjunctiva and lids normal  ENMT: normal external nose and ears.  normal oropharynx  Lungs:normal respiratory effort.  no cyanosis.  Extremities:extremities normal, atraumatic, no cyanosis or edema  Skin: warm and dry, no rashes.  Psych: alert and oriented to person, place and time  MSK: tenderness of below muscle groups     Trigger/ Tender Points Identification:   No images are attached to the encounter.      Assessment and Plan   [name] is a 84 y.o. y.o. female who presents with   1. Cervicalgia  Acupuncture    INJECT TRIGGER POINTS, > 3   2. Cervical myofascial strain, sequela  Acupuncture    INJECT TRIGGER POINTS, > 3   3. Myofascial pain  Acupuncture    INJECT TRIGGER POINTS, > 3   4. TMJ syndrome     5. Myasthenia gravis without (acute) exacerbation (HCC/RAF)       # Chronic neck pain  # Cervical myofascial pain  Temporary improvement with treatments, overall stable but persistent. Patient with chronic neck pain for years, underlying myasthenia gravis, and MRI with multilevel degenerative changes and neural foraminal narrowing. Exam reveals multiple trigger points suggesting significant myofascial component to pain.   - repeat acupuncture today  - repeat trigger point injections today   - continue TENS unit use, use on St[address]36  - continue neck and heat shiatsu massage device   - gentle neck stretching. Discussed community massage.     # Chronic left knee pain  - acupuncture done today     # TMJ syndrome  - TPIs done today  ???  # Myasthenia gravis   - Qi tonic foods   - continue American ginseng tea, 1-2 times per day  - follow-up with neurology, receiving IVIG     Treatments Provided Today:     Acupuncture point interventions:    LI-4/Lv-3, Sp-6/St-36 LI-10 Sp-10           Heding, Xiyan Acupuncture Treatment Duration: 20 Minutes  Injection of : Trigger Points with 0.92mL 1% lidocaine      Trapezius Levator scapulae, Rhomboids                Trigger Point Injection Procedure Note   Procedure: Trigger point injection of Trapezius Levator scapulae, Rhomboids              Indication: Myofascial pain; pain or altered sensation in the expected distribution of referred pain from a trigger point; taut band palpable in an accessible muscle with exquisite tenderness at one point along the length of it; some degree of restricted motion when measurable; reproduction of pain by stimulating trigger point  Consent: The risks (infection, bleeding, pain) and benefits of trigger points injections were explained to the patient and alternatives were discussed. All questions were answered to the patient's satisfaction. The patient verbalized understanding and consented to the trigger point injection(s).  Sterilization: The target area of the active trigger point(s) was sterilized using a 70% isopropyl alcohol pad.   Procedure: Manual palpation was used to identify the target area. Using a 1-inch 23 gauge and/or 0.5-inch 25 gauge needle, the following procedure was completed:   Injection of : Trigger Points with 0.6mL 1% lidocaine   into Trapezius Levator scapulae, Rhomboids              The needle was infiltrated 1-3 cm in a fanwise method into each trigger point location. Manual pressure was applied after the procedure. There was no significant blood loss. The patient tolerated the procedure well.    Follow-up     Return in about 4 weeks (around 11/19/2019)  for [place] or acupuncturist / K Hui or Jones Bales.    Future Appointments   Date Time Provider Department Center   10/27/2019  4:40 PM York Ram., MD GERI IMS 420 MEDICINE   10/30/2019  1:15 PM Alvina Chou, AuD AUD [id number] AUDIOLOGY/SP   11/16/2019  2:30 PM Trenda Moots., MD ESTWS [phone] MEDICINE   12/10/2019  3:30 PM Trenda Moots., MD [facility] [phone] MEDICINE 02/16/2020 11:30 AM [facility] NEUMUSC B200 NEUROLOGY   05/03/2020  1:30 PM Sherilyn Banker, OD DS VIS [facility] OPH     Seen and discussed with Dr. Karie Mainland    Author: Will Bonnet, MD, 10/22/2019, 5:53 PM   [facility] Fellow and Clinical Instructor

## 2019-10-22 NOTE — Patient Instructions
Future Appointments   Date Time Provider [place]   10/27/2019  4:40 PM Molinda Bailiff., MD GERI IMS 420 MEDICINE   10/30/2019  1:15 PM Lilyan Gilford, AuD AUD [id number] AUDIOLOGY/SP   11/16/2019  2:30 PM Sarita Haver., MD ESTWS [phone] MEDICINE   02/16/2020 11:30 AM [facility]U [facility] [place]   05/03/2020  1:30 PM Shoku, Jeannette How, OD DS VIS [facility] OPH

## 2019-10-27 ENCOUNTER — Ambulatory Visit: Payer: Commercial Managed Care - Pharmacy Benefit Manager | Attending: Geriatric Medicine

## 2019-10-27 DIAGNOSIS — R1012 Left upper quadrant pain: Secondary | ICD-10-CM

## 2019-10-27 DIAGNOSIS — G7001 Myasthenia gravis with (acute) exacerbation: Secondary | ICD-10-CM

## 2019-10-27 NOTE — Progress Notes
OUTPATIENT GERIATRICS CLINIC NOTE    PATIENT:  [name]   MRN:  [id number]  DOB:  02/13/1936  DATE OF SERVICE:  10/27/2019  PRIMARY CARE PHYSICIAN: York Ram., MD    CHIEF COMPLAINT:   No chief complaint on file.      [place] [facility]:  [employer]    [facility] [facility]:    Chaperone status:  No data recorded    HISTORY OF PRESENT ILLNESS     [name] is a 84 y.o. female who presents today for *follow up**. Patient is accompanied by *no one**. History today is per the patient,and review of available recent records in [facility] and Care Everywhere.     Patient is a(n) []  reliable []  unreliable historian and additional collateral information was obtained during the visit today from:     Bothered by sinus fullness.  Some more dry mouth with duloxetine.    Per chart review, pertinent medical history:  No past medical history on file.  No past surgical history on file.    ALLERGIES     Allergies   Allergen Reactions   ??? Beta Adrenergic Blockers Other (See Comments)     Avoid d/t Myasthenia Gravis  Avoid d/t Myasthenia Gravis     ??? Levofloxacin Other (See Comments)     Flare of Myasthenia Gravis   ??? Macrolides And Ketolides Other (See Comments)     Avoid d/t Myasthenia Gravis  Use with caution d/t Myasthenia Gravis  Use with caution d/t Myasthenia Gravis     ??? Sulfa Antibiotics Other (See Comments)     May possibly have caused deafness in one ear  May possibly have caused deafness in one ear  other     ??? Botulinum Toxins Other (See Comments)     Muscle weakness r/t Myasthenia Gravis  Muscle weakness r/t Myasthenia Gravis     ??? Statins Other (See Comments)     Muscle aches  Muscle aches  Muscle aches     ??? Plastic Tape Itching and Other (See Comments)     Turns the skin red where applied        MEDICATIONS     Personally reviewed.    Medications that the patient states to be currently taking   Medication Sig   ??? Albuterol Sulfate AEPB Takes 1 to 2 puffs every 4 hours as needed for wheezing or SOB Inhale Takes 1 to 2 puffs every 4 hours as needed for wheezing or SOB .Marland Kitchen   ??? azaTHIOprine 50 mg tablet Take one and one-half tablet (75 mg total) by mouth two (2) times daily.   ??? buPROPion, SR, (WELLBUTRIN SR) 200 mg 12 hr tablet Take 1 tablet (200 mg total) by mouth two (2) times daily.   ??? clonazePAM 1 mg disintegrating tablet Take 1 tablet (1 mg total) by mouth at bedtime. Max Daily Amount: 1 mg   ??? dexlansoprazole 60 mg DR capsule Take 1 capsule (60 mg total) by mouth daily.   ??? DULoxetine 20 mg DR capsule Take 1 capsule (20 mg total) by mouth daily.   ??? fluticasone-vilanterol (BREO ELLIPTA) 100-25 mcg/inh inhaler Inhale 1 puff daily.   ??? GAMMAGARD 30 GM/300ML SOLN every twenty one (21) days .   ??? lidocaine 5% patch Place 1 patch onto the skin every twelve (12) hours 12 hours on 12 hours off.Marland Kitchen   ??? loratadine 10 mg tablet Take 1 tablet (10 mg total) by mouth daily as needed for Allergies.   ???  losartan 25 mg tablet Take 1 tablet (25 mg total) by mouth daily.   ??? montelukast 10 mg tablet Take 10 mg by mouth as needed for.   ??? NIFEdipine (PROCARDIA XL) 60 mg 24 hr tablet Take 1 tablet (60 mg total) by mouth daily.   ??? [EXPIRED] pyridostigmine 60 mg tablet Take 1 tablet (60 mg total) by mouth two (2) times daily.   ??? trimethoprim 100 mg tablet Take 1 tablet (100 mg total) by mouth daily.   ??? [DISCONTINUED] azaTHIOprine 50 mg tablet Take 1.5 tablets (75 mg total) by mouth two (2) times daily.   ??? [DISCONTINUED] azaTHIOprine 50 mg tablet Take 1.5 tablets (75 mg total) by mouth two (2) times daily.   ??? [DISCONTINUED] dexlansoprazole 60 mg DR capsule Take 1 capsule (60 mg total) by mouth daily.   ??? [DISCONTINUED] dexlansoprazole 60 mg DR capsule Take 1 capsule (60 mg total) by mouth daily.   ??? [DISCONTINUED] DULoxetine 20 mg DR capsule Take 1 capsule (20 mg total) by mouth daily.   ??? [DISCONTINUED] DULoxetine 20 mg DR capsule Take 1 capsule (20 mg total) by mouth daily.   ??? [DISCONTINUED] NIFEdipine (PROCARDIA XL) 30 mg 24 hr tablet Take 1 tablet (30 mg total) by mouth daily.       SOCIAL HISTORY     Social History     Social History Narrative   ??? Not on file        FUNCTIONAL STATUS     BADLs:  IADLs:    Ambulates with   Falls in past year:  Afraid of falling:    GERIATRIC REVIEW OF SYSTEMS     Vision:    []   glasses     []  legally blind  Hearing: []  hearing aids    Nutrition: []  normal   []  impaired  []  vegan  []  vegetarian  []  low salt  []  low-carb   Swallowing: []  impaired   Dentures:   []  yes    Depression:  []  yes   Cognition:     Incontinence: [] urine  []  fecal  []  urine and fecal      ADVANCED CARE PLANNING     Advanced directives on file: []  No  []   Yes ??? Completed:     Medical DPOA on file:   []   Yes ???   []   No ??? Patient designates ** * to be their surrogate medical decision-maker.         HEALTH CARE MAINTENANCE     IMMUNIZATIONS:   Immunization History   Administered Date(s) Administered   ??? COVID-19, mRNA, LNP-S, PF, 30 mcg/0.3 mL Dose (Pfizer-BioNTech) 03/11/2019, 04/01/2019   ??? DTaP 12/31/2015   ??? influenza vaccine IM quadrivalent (Fluzone Quad) MDV (16 months of age and older) 11/26/2016   ??? influenza vaccine IM quadrivalent adjuvanted (FluAD Quad) (PF) SYR (30 years of age and older) 11/05/2018   ??? influenza, unspecified formulation 04/01/2019, 04/01/2019   ??? pneumococcal polysaccharide vaccine 23-valent (Pneumovax) 01/10/2019       Mammogram:  DXA:  PAP:  Colonoscopy:      PHYSICAL EXAM     BP 179/93  ~ Pulse (!) 120  ~ Temp 36.7 ???C (98 ???F) (Temporal)  ~ Resp 19  ~ Ht 5' 5'' (1.651 m)  ~ Wt 148 lb (67.1 kg)  ~ SpO2 96%  ~ BMI 24.63 kg/m???   Wt Readings from Last 3 Encounters:   10/20/19 145 lb (65.8 kg)   10/08/19 145  lb 11.2 oz (66.1 kg)   09/22/19 146 lb (66.2 kg)         System Check if normal Positive or additional negative findings   GEN  [x]  NAD     Eyes  []  Conj/Lids []  Pupils  []  Fundi   []  Sclerae []  EOM     ENT  []  External ears   []  Otoscopy   []  Gross Hearing    []   External nose   []  Nasal mucosa   []  Lips/teeth/gums    []  Oropharynx    []  Mucus membranes      Neck  [x]  Inspection/palpation    [x]  Thyroid     Resp  [x]  Effort    [x]  Auscultation       CV  []  Rhythm/rate   [x]  Murmurs   [x]  Edema   []  JVP non-elevated    Normal pulses:   []  Radial []  Femoral  []  Pedal     Breast  []  Inspection []  Palpation     GI  []  Bowel sounds    [x]  Nontender   [x]  No distension    []  No rebound or guarding   []  No masses   []  Liver/spleen    []  Rectal     GU  F:  []  External []  vaginal wall         []  Cervix     []  mucus        []  Uterus    []  Adnexa   M:  []  Scrotum []  Penis         []  Prostate     Lymph  [x]  Cervical []  supraclavicular     [x]  Axillae   []  Groin/inguinal     MSK []  Gait  []  Back     Specify site examined:    []  Inspect/palp []  ROM   []  Stability []  Strength/tone []  Used arms to push up from seated to standing position   Assistive device:  []  single point cane []  quad cane  []  FWW []  rollator walker      Skin  []  Inspection []  Palpation     Neuro  []  Alert and oriented     []  CN2-12 intact grossly   []  DTR      []  Muscle strength      []  Sensation   []  Pronator drift   []  Finger to Nose/Heel to Shin   []  Romberg     Psych  []  Insight/judgement     []  Mood/affect    []  Gross cognition            LABS/STUDIES     LABS:  Lab Results   Component Value Date    WBC 4.60 09/22/2019    WBC 4.35 07/14/2019    WBC 4.40 04/14/2019    HGB 12.3 09/22/2019    HGB 12.7 07/14/2019    HGB 13.0 04/14/2019    MCV 100.0 (H) 09/22/2019    PLT 342 09/22/2019    PLT 323 07/14/2019    PLT 314 04/14/2019     Lab Results   Component Value Date    NA 139 09/22/2019    NA 140 04/14/2019    NA 141 12/17/2018    K 4.1 09/22/2019    K 4.5 04/14/2019    K 4.1 12/17/2018    CREAT 1.10 09/22/2019    CREAT 1.05 04/14/2019    CREAT 1.13 12/17/2018    GFRESTNOAA 47 09/22/2019    GFRESTNOAA  50 04/14/2019    GFRESTNOAA 46 12/17/2018    GFRESTAA 54 09/22/2019    GFRESTAA 58 04/14/2019    GFRESTAA 53 12/17/2018    CALCIUM 9.0 09/22/2019    CALCIUM 9.4 04/14/2019    CALCIUM 9.8 12/17/2018     Lab Results   Component Value Date    ALT 13 09/22/2019    ALT 14 04/14/2019    AST 25 09/22/2019    AST 21 04/14/2019    ALKPHOS 80 09/22/2019    BILITOT 0.3 09/22/2019    ALBUMIN 3.7 (L) 09/22/2019    ALBUMIN 3.9 04/14/2019     Lab Results   Component Value Date    TSH 1.00 12/17/2018     Lab Results   Component Value Date    HGBA1C 4.9 09/22/2019     No results found for: CHOL, CHOLDLCAL  No results found for: CHOLDLQ  No results found for: [facility]  Lab Results   Component Value Date    FE 87 07/14/2019    FERRITIN 57 07/14/2019    FOLATE 11.0 09/22/2019    TIBC 356 07/14/2019     Lab Results   Component Value Date    VITAMINB12 217 (L) 09/22/2019     Lab Results   Component Value Date    BNP 92 09/22/2019     No results found for: PSATOTAL      STUDIES:    EKG  This data element was independently reviewed and interpreted by me     NSR NO ST or T changes    XR CHEST PA LAT 2V  September 22, 2019???  COMPARISON: KUB December 17, 2018  ???  History: sob  ???  FINDINGS:  ???  Lungs: Clear  Heart/aorta: Normal heart size. The thoracic aorta is mildly calcified, tortuous and ectatic.  Adenopathy: None  Pleura: No effusion  Bones and Chest wall: No acute bony or body wall  findings. Osteopenia and bony maturational changes. Right upper quadrant cholecystectomy clips stable from October 2020  ???  ???  IMPRESSION:  ???  No acute findings or abnormalities related to provided history.    ASSESSMENT and PLAN     Nate Perri is a 84 y.o. female who presents today for *follow up**.        #HTN--Increase nifedipine CR 60 mg daily. Continue losartan 25 mg.    #Aortic Calcification--noted on chest imaging     September 22, 2019???       --Not Worsening.  Chronic.  Will monitor and treat underlying risk factors with medical and lifestyle interventions as warranted by balance between benefits and burdens.    #Macrocytosis--check B12, folic acid at some point  #Deconditioning--initiate home PT  #GERD--s/p Nisan fundoplication  #COPD--mixed--per PFT's from [name].--consider reinitiation of inhalers.  #s/p rectocele repair  #s/p cholecystectomy  #Nocturnal hypoxemia--presumably related to MG crisis but has history of COPD. ???Will order nocturnal home O2 study and consider PFT's/Pulmonary evaluation once settled down. ???May need to restart BREO inhaler.  #Myasthenia Gravis--s/p plasmapheresis x5 October 2020, no Thymoma, on Azathioprine 150 daily and mestinon 60 tid.??????Saw???Dr. Enedina Finner at [facility]. ???Was considering Soliris.??????Ordering IVIG and MRI.  Encourage ongoing neurology follow up.   #IBS--titrate miralax to comfort. ???Consider duloxetine substitute for buproprion.  #Interstitial cystitis--on daily Trimethoprim 100 for UTI prophylaxis. ???Using bladder instillations prn. OFF topical estrogen due to advice from neurologist re: MG.  To see Dr. Desma Maxim to see if she has additional insights.  #Major Depression--active.  Increase Duloxetine  to 40 mg daily. Referred patient to Dr. Randie Heinz.  #Allergic rhinitis--previously on nasal steroids.  #Breast CA--DCIS???by report--obtain mammo. ???Patient declined surgery in 2019 and experienced dry mouth from Tamoxifen. ???Was considering Anastrozole. Await records from prior mammogram and treatment.  Per records from [facility]:   ''07/20/2017 Screening detected right breast calcifications and distortion. The distortion was in the upper outer quadrant: Biopsy DCIS intermediate grade with CSL; calcifications UIQ: 2.8 cm: Biopsy DCIS+ ALH +CSL; 4 cm apart, axilla negative, Tis NX stage 0''  Due for repeat mammo Apirl 2022  #DM with CKD--stabe 3b--based on labs from [name]. ???FOllow here. ???If BP permits, may consider ARB BUT patient with h/o angioedema so will ONLY initiate this agent with great caution.  Refer to Ophthalmology.  #OA knees--previously getting hyaluronic acid injections.  #Anxiety  #Benzodiazepine dependence--will work to wean over time.  #Fibromyalgia--duloxetine as above. 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  #Statin intolerance      FOLLOW-UP     RTC     Future Appointments   Date Time Provider Department Center   10/27/2019  4:40 PM York Ram., MD GERI IMS 420 MEDICINE   10/30/2019  1:15 PM Alvina Chou, AuD AUD [id number] AUDIOLOGY/SP   11/16/2019  2:30 PM Trenda Moots., MD ESTWS [phone] MEDICINE   12/10/2019  3:30 PM Trenda Moots., MD ESTWS [phone] MEDICINE   02/16/2020 11:30 AM [facility]U MG [facility] NEUMUSC B200 NEUROLOGY   05/03/2020  1:30 PM Shoku, Bita, OD DS VIS REHAB OPH         The above plan of care, diagnosis, orders, and follow-up were discussed with the patient and/or surrogate. Questions related to this recommended plan of care were answered.    ANALYSIS OF DATA (Needs to meet 1 category for moderate and 2 categories for high LOS)     I have:     Category 1 (Needs 3 for moderate and high LOS)     []  Reviewed []  1 []  2 []  ? 3 unique laboratory, radiology, and/or diagnostic tests noted below    Test/Study:  on date .    []  Reviewed []  1 []  2 []  ? 3 prior external notes and incorporated into patient assessment    I reviewed Dr. 's note in specialty  from date .    []  Discussed management or test interpretation with external provider(s) as noted      []  Ordered []  1 []  2 []  ? 3 unique laboratory, radiology, and/or diagnostic tests noted in A&P    []  Obtained history from independent historian:       Category 2  []  Independently interpreted the test    Category 3  []  Discussed management or test interpretation with external provider(s) as noted      INTERACTION COMPLEXITY and SOCIAL DETERMINANTS of HEALTH       PROBLEM COMPLEXITY   []  New problem with uncertain diagnosis or prognosis (moderate)   []  Multiple stable chronic problems (moderate)   []  Chronic problem not stable - not controlled, symptomatic, or worsening (moderate)   []  Severe exacerbation of chronic problem (high)   []  New or chronic problem that poses threat to life or bodily function (high)     MANAGEMENT COMPLEXITY   []  Old or external/outside records reviewed   []  Discussion with alternate (proxy) if patient with impaired communication / comprehension ability (e.g., dementia, aphasia, severe hearing loss).   []  Repeated questions (or disagreement) between  patient and/among caregivers/family during the visit.  []  Caregiver/patient emotions/behavior/beliefs interfering with implementation of treatment plan.  []  Independent interpretation of test (EKG, Chest XRay)   []  Discussion of case with a consultant physician     RISK LEVEL   []  Prescription drug management (moderate)   []  Minor surgery with CV risk factors or elective major surgery (moderate)   []  Dx or Rx significantly limited by SDoH (inadequate housing, living alone, poor health care access, inappropriate diet; low literacy) (moderate)   []  Major surgery - elective with CV risk factors or emergent (high)   []  Need for hospitalization (high)   []  New DNR or de-escalation of care (high)      SDoH  The diagnosis or treatment of said conditions is significantly limited by the following social determinants of health:  []  Z59.0 Homelessness  []  Z59.1 Inadequate housing  []  Z59.2 Discord with neighbors, lodgers and landlord  []  Z60.2 Problems related to living alone  []  Z59.8 Other problems related to housing and economic circumstances  []  Z59.4 Lack of adequate food and safe drinking water  []  Z59.6 Low income  []  Z59.7 Insufficient social insurance and welfare support  []  Z59.9 Problems related to housing and economic circumstances, unspecified  []  Z75.3 Unavailability and inaccessibility of health care facilities  []  Z75.4 Unavailability and inaccessibility of other helping agencies  []  Z72.4 Inappropriate diet and eating habits  []  Z62.820 Parent-biological child conflict  []  Z63.8 Other specified problems related to primary support group  []  Z55.0 Illiteracy and low level literacy   []  Z56.9 Unspecified problems related to employment        If Billing Based on Time:     I performed the following items on the day of service:    [x]  Preparing to see the patient (e.g., review of tests)  [x]  Obtaining and/or reviewing separately obtained history   [x]  Performing a medically appropriate examination and/or evaluation   [x]  Counseling and educating the patient/family/caregiver   [x]  Ordering medications, tests, or procedures  [x]  Referring and communicating with other healthcare professionals (when not separately reported)  [x]  Documenting clinical information in the EHR  []  Independently interpreting results and communicating results to patient/family/caregiver    I spent the following total amount of time on these tasks on the day of service:  New Patient     Established Patient  []  15-29 minutes ??? [id number]    []  up to 9 minutes ??? 99211  []  30-44 minutes ??? [id number]     []  10-19 minutes ??? [id number]   []  45-59 minutes ??? [id number]      []  20-29 minutes ??? [id number]   []  60-74 minutes ??? [id number]   []  30-39 minutes ??? 99214         [x]  40-55 minutes ??? [id number]    []  I spent an additional ** * 15-minute-increment(s) for a total of * ** minutes on these tasks on the day of service. [id number] for each additional 15 minutes.)    Author: York Ram, MD 10/27/2019

## 2019-10-27 NOTE — Consults
Hearing Aid Check  ???  Jenita Rayfield was fitted with a hearing aid for the right ear. [name]???was fitted with a BiCROS device on 05/23/2019 and returned the device on 07/04/2019 due to lack of benefit. Cherish was seen today for ongoing management of hearing aids.   ???  Hearing Aid:  Manufacturer: Phonak  Model: Audeo P70  Right SN: [id number]  Speaker Length & Power: 2S  Dome Size: Medium vented; changed from left   Battery Size: 312  Initial HAF: 07/22/2019  Hearing Aid Warranty Expiration: 10/06/2022  Right to Return Expiration Date: 09/05/2019    Accessories:   TV connector  ??? SN: [id number]  Warranty: n/a; received as part of promotion     Patient reports:   ??? Batteries aren't lasting as long as others (size 13).   ??? [name] is difficult to insert and is wondering if there is a smaller one.   ??? Sound of the hearing aids are good and clear.   ??? Using the phone on speaker for phone calls rather than the hearing aid due to habit.   ??? Has not needed to adjust the volume on the hearing aid.   ??? Still having difficulty in the dinning room; even with just 4 people due to the acoustics of the den.   ??? Feels right ear hearing decreased without use of the hearing aid.     Actions taken:  ??? Datalogging: could not read due to software update being completed  ??? Hearing aids cleaned and checked. Listening check revealed good sound quality.    ??? Changed dome to medium vented; no feedback present in office and patient confirmed good fit/comfort. Patient provided additional domes.    ??? Dispensed TV Connector and provided patient with [employer] hotline for pairing assistance if needed.    ??? Counseled patient on smaller battery size as compared to CROS which explains the decreased battery life.   ??? Informed patient speaker phone is no longer necessary with use of the direct streaming feature to her hearing aid.   ??? Patient declined programming changes (increased volume) as she is satisfied with current settings.     Follow-up Recommendations:  Regular daily use of hearing aid was encouraged.  Patient should return to [facility] in 5 months for annual hearing test and hearing aid check.       [place], [place], [place], 10/30/2019  Doctoral Audiology Extern        Alvina Chou, AuD, 10/30/2019   Audiologist     Education:  Topic: Hearing aid(s)  Person Taught: patient  Barrier: None  Needs: Knows well  Teaching Method: Verbal instruction and Written instruction    Outcome: Able to repeat information and Able to demonstrate what was taught

## 2019-10-28 MED ORDER — NITROFURANTOIN MONOHYD MACRO 100 MG PO CAPS
100 mg | ORAL_CAPSULE | Freq: Two times a day (BID) | ORAL | 0 refills | 7.00000 days | Status: AC
Start: 2019-10-28 — End: ?
  Filled 2019-10-28: qty 14, 7d supply, fill #0

## 2019-10-28 MED ORDER — NIFEDIPINE ER OSMOTIC RELEASE 60 MG PO TB24
60 mg | ORAL_TABLET | Freq: Every day | ORAL | 3 refills | 90.00 days | Status: AC
Start: 2019-10-28 — End: ?
  Filled 2019-10-28: qty 90, 90d supply, fill #0

## 2019-10-28 MED ORDER — AZATHIOPRINE 50 MG PO TABS
75 mg | ORAL_TABLET | Freq: Two times a day (BID) | ORAL | 3 refills | Status: AC
Start: 2019-10-28 — End: 2019-10-28
  Filled 2019-10-28: qty 270, 90d supply, fill #0

## 2019-10-28 MED ORDER — DULOXETINE HCL 20 MG PO CPEP
20 mg | ORAL_CAPSULE | Freq: Every day | ORAL | 3 refills | Status: AC
Start: 2019-10-28 — End: 2019-10-28
  Filled 2019-10-28: qty 90, 90d supply, fill #0

## 2019-10-28 MED ORDER — DEXLANSOPRAZOLE 60 MG PO CPDR
60 mg | ORAL_CAPSULE | Freq: Every day | ORAL | 3 refills | 30.00000 days | Status: AC
Start: 2019-10-28 — End: ?

## 2019-10-28 MED ORDER — DEXLANSOPRAZOLE 60 MG PO CPDR
60 mg | ORAL_CAPSULE | Freq: Every day | ORAL | 3 refills | Status: AC
Start: 2019-10-28 — End: 2019-10-28
  Filled 2019-10-28: qty 30, 30d supply, fill #0

## 2019-10-28 MED ORDER — AZATHIOPRINE 50 MG PO TABS
75 mg | ORAL_TABLET | Freq: Two times a day (BID) | ORAL | 3 refills | 90.00 days | Status: AC
Start: 2019-10-28 — End: ?

## 2019-10-28 MED ORDER — DULOXETINE HCL 20 MG PO CPEP
20 mg | ORAL_CAPSULE | Freq: Every day | ORAL | 3 refills | 90.00 days | Status: AC
Start: 2019-10-28 — End: 2019-11-01

## 2019-10-28 NOTE — Patient Instructions
Take two of the 20 mg duloxetines for a total daily dose of 40 mg  Increase nifedipine to 60 mg daily (take two of the 30 mg daily for now)

## 2019-10-29 ENCOUNTER — Telehealth: Payer: BLUE CROSS/BLUE SHIELD

## 2019-10-29 NOTE — Telephone Encounter
Good morning,    Medication Verification      Medication:  Gammagaurd    Caller would like to verify the following (Please check all that applies):    [x]  Dosage    [x]  Quantity      [x]  Directions     Additional information?       Per pharmacy patient called pharmacy advising frequency was changed to every 3 weeks. Please return call to clarify new order & send new prescription.    CBN: [phone]    [phone]    Patient or caller has been notified of the 24-48 hour turnaround time.

## 2019-10-29 NOTE — Telephone Encounter
Faxed out clinical notes to [facility] to continue IVIG treatment: Decrease frequency IVIg 60g (1g/kg) back to q3 weeks. Fax# [phone].

## 2019-10-30 ENCOUNTER — Ambulatory Visit: Payer: BLUE CROSS/BLUE SHIELD | Attending: Audiologist

## 2019-10-30 DIAGNOSIS — H905 Unspecified sensorineural hearing loss: Secondary | ICD-10-CM

## 2019-10-30 NOTE — Patient Instructions
A note from your audiologist:     Thank you for choosing [facility] for your care. It was a pleasure seeing you in clinic today.      Our plan, as discussed today:  Regular daily use of hearing aid was encouraged.  Patient should return to [facility] in 5 months for annual hearing test and hearing aid check.         Please call the office at [phone] if you need to return sooner for any reason.        ----------------------------------------------------------------------------------------------------------------------         Frequently asked questions:           1. How do I get my results from the visit?     ? If you sign up for [facility] online ([email or web address]), we will release the report online with comments once your results are available, usually within 1 week.   ? If you are not signed up for [facility] when your report becomes available, a copy of the audiogram/report can be obtained by contacting Medical Records:  ? Telephone: [phone]   ? In-person during the hours of 8:00 AM-4:30 PM, Monday-Friday  ? [facility]- [address], [address] [place], [place] [address]  ? [facility]- [address], [address], [place] [address]  ? Please make sure your address and phone number are up-to-date in our system when you check in. We use these to contact you about important health information, including audiology reports.        2. How do I access my Audiology reports via MyChart?     Accessing Audiology notes online:    The report is viewable in [place] ''MyChart'' under clinical notes    1. Hover your mouse over the ''Visits'' icon.   2. Click on the ''Appointments and Visits'' link.   3. Click on the desired visit.   4. Click on the ''Clinical Notes'' tab located below the ''Appointment Details'' header.   5. Listed are the types of Ambulatory Notes you may see:   ? Progress Notes   ? Consult   ? Procedure   ? H&P   ? Goals of Care   ? Interdisciplinary Notes     **All of Audiology visit notes (Reports) are considered ''Consult Notes''.     Please call [phone] for additional help if you still cannot see notes.     A copy of the audiogram/reports can be obtained by contacting Medical Records:    Phone: [phone] [facility] Medical Records    In person during the hours of 8:00 AM-4:30 PM, Monday-Friday  [facility]- [address], [address] [place], [place] [address]  [facility]- [address], [address], [place] [address]      Brayton Mars acceder a sus informes de Audiolog???a en l???nea:      El informe se puede ver en [place] ''MyChart''. ?Se encuentra bajo notas cl???nicas.      Para acceder al informe:   1.  Pose el cursor [employer] ???icono de ???Visits.??? (consultas)  2.  Presione el enlace de ???Appointments and Visits??? (citas y consultas)  3.  Haga clic en la consulta deseada  4.  Haga clic en la pesta???a ''Clinical notes??? (notas cl???nicas), situado debajo del rubro ''Appontment  Details'' (detalles de la cita)  5.  A continuaci???n, se enumeran los tipos de notas ambulatorias que podr??? ver:   ? Progress Notes (notas evolutivas)  ? Consult (interconsulta)   ?  Procedure (procedimiento)    ? H&P (historial m???dico y un examen f???sico)   ? Goals of Care (metas de tratamiento)   ? Interdisciplinary Notes (notas interdisciplinarias)    **Todas las notas de la consulta en Audiolog???a (Reports) se consideran ???Consult notes??? (notas de interconsulta)     Por favor llame al [phone] para obtener ayuda adicional si todav???a no puede ver las notas.        Una copia del audiograma/informes se pueden obtener al comunicarse con el archivo de historia cl???nica     Llame por Tel???fono al: [phone], [facility] Medical Records (archivo de historia cl???nica en [place])     Atenci???n disponible en persona durante las horas entre 8:00 AM-4:30 PM, de lunes a viernes     [facility]- [address], [address] [place], [place] [address]     [facility]- [address], [place] [address]           3. How can I reach my audiologist after the visit?     For non-emergency contact, you may reach your audiologist two ways:     ??? Online: send non-urgent medical questions through the [facility] website ([email or web address]). These are usually returned within 2 business days.  ??? Call the [facility] at [phone] during business hours to leave a message (or schedule a return appointment).         4. How do I get after-hours care for my hearing devices?    Hearing Aids  ??? Contact hearing aid company directly for connection questions regarding your mobile phone/electronic device, accessory devices, smartphone App, and other technical support  ? [employer]   ??? Call [phone]  ??? Visit [email or web address].com for troubleshooting information  ? Phonak   ??? Call [phone]  ??? Visit [email or web address] for troubleshooting information  ? [employer]   ??? Call [phone]   ??? Visit https://www.resound.com/en-us/help/hearing-aids for troubleshooting information  ??? Email at consumerhelp@gnresound .com  ? Star[employer]   ??? Call [phone]   ??? Visit https://www.starkey.com/support/get-help for troubleshooting information  ? Widex   ??? Call [phone]  ??? Visit https://www.widex.com/en-us/support/ for troubleshooting information    Cochlear Implants  ??? Contact cochlear implant company directly to for troubleshooting and to order any necessary equipment.  ? [employer]   ??? Call [phone]  ??? Email at customer@cochlear .com  ??? Visit https://support.[email or web address].au  ??? For disposable battery orders call:   ??? [phone]  or visit www.CochlearBatteries.com  ??? 1 (855) J4654488  or visit HugeFiesta.tn.   ? [employer]   ??? Call Toll Free: [phone] [place]   ??? Email: CustomerService@[email or web address] .com   ??? Visit: https://advancedbionics.com/sg/en/home/support/troubleshooting-guide.html  ? [employer]   ??? Call [phone]  ??? Visit: https://www.medel.com/en-us/support/product-support

## 2019-10-31 DIAGNOSIS — I10 Essential (primary) hypertension: Secondary | ICD-10-CM

## 2019-10-31 MED ORDER — DULOXETINE HCL 20 MG PO CPEP
40 mg | ORAL_CAPSULE | Freq: Every day | ORAL | 3 refills | 90.00000 days | Status: AC
Start: 2019-10-31 — End: ?

## 2019-11-03 ENCOUNTER — Telehealth: Payer: BLUE CROSS/BLUE SHIELD

## 2019-11-03 NOTE — Telephone Encounter
Message to Practice/Provider      Message: Mike Gip with [facility] called to advised they sent a request for additional information needed for IVIG on Thursday, 09.02.21, & resent today 09.07.21. Informed MD signature is needed. Stated clinical note sent was not sufficient. Please assist at earliest convenience. Thank you.       Return call is not being requested by the patient or caller.    Patient or caller has been notified of the 24-48 hour processing turnaround time if applicable.

## 2019-11-04 NOTE — Telephone Encounter
Found fax sent, will have Dr. Jefm Bryant sign and fax back.

## 2019-11-06 ENCOUNTER — Telehealth: Payer: BLUE CROSS/BLUE SHIELD

## 2019-11-06 NOTE — Telephone Encounter
Appointment Accommodation Request      Appointment Type: return  Reason for sooner request: pt is calling to schedule a 1 month follow up with Dr. Mindi Slicker and denied a telehealth appt as well with another doc.    Date/Time Requested (If any): 1 month from 10/27/2019    Last seen by MD: 10/27/2019    Any Symptoms:  []  Yes  [x]  No      ? If yes, what symptoms are you experiencing:   o Duration of symptoms (how long):     Patient or caller was offered an appointment but declined.    Patient or caller was advised to seek emergency services if conditions are urgent or emergent.    Patient has been notified of the 24-48 hour turnaround time.

## 2019-11-09 NOTE — Telephone Encounter
Reply by: Lurine Imel Valencia Kaija Kovacevic      Appointment scheduled.     Giovoni Bunch

## 2019-11-09 NOTE — Telephone Encounter
Forwarded by: Katana Berthold Valencia Aleks Nawrot

## 2019-11-12 ENCOUNTER — Ambulatory Visit: Payer: BLUE CROSS/BLUE SHIELD

## 2019-11-12 ENCOUNTER — Ambulatory Visit: Payer: MEDICARE | Attending: Geriatric Medicine

## 2019-11-16 ENCOUNTER — Ambulatory Visit: Payer: Commercial Managed Care - Pharmacy Benefit Manager

## 2019-11-16 DIAGNOSIS — M26629 Arthralgia of temporomandibular joint, unspecified side: Secondary | ICD-10-CM

## 2019-11-16 DIAGNOSIS — M797 Fibromyalgia: Secondary | ICD-10-CM

## 2019-11-16 DIAGNOSIS — M7918 Myalgia, other site: Secondary | ICD-10-CM

## 2019-11-16 DIAGNOSIS — F419 Anxiety disorder, unspecified: Secondary | ICD-10-CM

## 2019-11-16 DIAGNOSIS — G7000 Myasthenia gravis without (acute) exacerbation: Secondary | ICD-10-CM

## 2019-11-16 DIAGNOSIS — M542 Cervicalgia: Secondary | ICD-10-CM

## 2019-11-16 DIAGNOSIS — S161XXS Strain of muscle, fascia and tendon at neck level, sequela: Secondary | ICD-10-CM

## 2019-11-16 NOTE — Progress Notes
[facility] Progress Note     PATIENT: [name]  MRN: [id number]  DOB: May 28, 1935  DATE OF SERVICE: 11/16/2019  REFERRING PRACTITIONER: No ref. provider found  PRIMARY CARE PROVIDER: York Ram., MD    Chief Complaint   Patient presents with   ??? Neck Pain     Subjective:      History of Present Illness:  [name] is a 84 y.o. female with PMH of  has no past medical history on file. who presents for follow-up cervical and lumbar spondylosis.     Initial Consult 04/16/19  # Chronic neck and shoulder pain  - present for many years, gradual onset, no inciting event or trauma. Underlying myasthenia gravis.   - Prior work-up: MR cervical spine with multilevel degenerative disc disease in setting of C4-C7 congenital cervical spinal canal stenosis, neural foraminal narrowing, facet joint degeneration   - Interventions: no medications for pain; was going to start PT but didn't go due to pandemic  - Self-care: cervical collar sometimes if needed to bend down  - Timing/Duration: constant, associated with activities   - Location/Radiation: bilateral neck, no radiation  - Quality: aching, sharp at times  - Severity: varies between 5 to 10 out of 10  - Modifiers: worse with activity. better with rest.   - Associated s/s: diffuse generalized weakness of bilateral lower extremities (thinks its related to myasthenia gravis), leg pain  - Denies: numbness, tingling, upper extremity weakness, changes in bowel or bladder, changes in gait  ???  # Myasthenia gravis  - hospitalized 11/2018 for exacerbation, receiving IVIG infusions regularly  - uses a walker / cane sometimes  ???  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 Husband died 2 years ago of CHF.   BM: intermittent constipation, diarrhea   Temperature: tends to run cold  Supplements: none  __________________________________________________    Follow-up 05/21/19  - feels the treatments last visit did help reduce her pain temporarily   - still with persistent neck pain and left knee weakness  - got neck and shoulder massager and using it at home  - purchased TENS unit but forgot to bring it today  - [place]    Follow-up 06/12/19  - treatments help temporarily with the neck pain  - using shiatsu neck massager  - has TENS unit, wondering where to place     Follow-up 07/03/19  - neck pain and left knee pain persists   - overall doesn't feel well, wondering if stopping her mestinon contributed, had upcoming neurology appointment  - brought TENS unit in today, was able to get it to work, but wondered where she should put the pads    Follow-up 07/17/19  - neck pain persistent, but treatments help temporarily  - saw neuromuscular, ordered sleep study, had IVIG yesterday with improvement of symptoms   - using TENS unit for neck and heating pad, with some help    Follow-up 07/31/19  - neck and shoulders doing ok  - left lateral thigh with increased myofascial pain, as well as left knee has been bothering her more    Follow-up 08/21/19  - IVIG helping, felt the best in a while this past Thursday   -  jaw tightness acting up, biting mouth, has TMJ syndrome   - left IT band still with pain, neck and shoulder doing better, treatments helping     Follow-up 09/18/19   - neck a lot better, but still having jaw clenching and grinding  - left knee bothering her   - dog fell off bed and needed surgery     Follow-up 10/02/19  - treatments temporarily helping  - still with bilateral neck, bilateral jaw, and left more than right knee pain today   - she has been watching the Olympics  - also staying in as pandemic with more cases     Follow-up 10/22/19  - treatments overall helping, more with her neck pain  - left knee more bothersome today   - sleep study ordered by neuromuscular to evaluate for OSA and restless leg syndrome  - started taking American Ginseng tea, enjoys drinking it     Follow-up 11/16/19  - acupuncture helps her feel relaxed, injections help with neck pain temporarily   - right neck with more tension today  - using TENS machine  - tried neck massager but it is too hard / rough   - enjoying American ginseng tea     Medications that the patient states to be currently taking   Medication Sig   ??? Albuterol Sulfate AEPB Takes 1 to 2 puffs every 4 hours as needed for wheezing or SOB Inhale Takes 1 to 2 puffs every 4 hours as needed for wheezing or SOB .Marland Kitchen   ??? azaTHIOprine 50 mg tablet Take one and one-half tablet (75 mg total) by mouth two (2) times daily.   ??? buPROPion, SR, (WELLBUTRIN SR) 200 mg 12 hr tablet Take 1 tablet (200 mg total) by mouth two (2) times daily.   ??? clonazePAM 1 mg disintegrating tablet Take 1 tablet (1 mg total) by mouth at bedtime. Max Daily Amount: 1 mg   ??? clonazePAM 1 mg tablet Take 1 tablet (1 mg total) by mouth three (3) times daily. Max Daily Amount: 3 mg   ??? dexlansoprazole 60 mg DR capsule Take 1 capsule (60 mg total) by mouth daily.   ??? DULoxetine 20 mg DR capsule Take 2 capsules (40 mg total) by mouth daily.   ??? fluticasone-vilanterol (BREO ELLIPTA) 100-25 mcg/inh inhaler Inhale 1 puff daily.   ??? GAMMAGARD 30 GM/300ML SOLN every twenty one (21) days .   ??? lidocaine 5% patch Place 1 patch onto the skin every twelve (12) hours 12 hours on 12 hours off.Marland Kitchen   ??? loratadine 10 mg tablet Take 1 tablet (10 mg total) by mouth daily as needed for Allergies.   ??? losartan 25 mg tablet Take 1 tablet (25 mg total) by mouth daily.   ??? montelukast 10 mg tablet Take 10 mg by mouth as needed for.   ??? NIFEdipine (PROCARDIA XL) 60 mg 24 hr tablet Take 1 tablet (60 mg total) by mouth daily.   ??? trimethoprim 100 mg tablet Take 1 tablet (100 mg total) by mouth daily.   No past medical history on file.    Objective:   BP 155/80 (BP Location: Left arm)  ~ Pulse 72  ~ Temp 36.6 ???C (97.8 ???F) (Temporal)     Physical Exam:  General: alert, appears stated age and cooperative  Eyes: conjunctiva and lids normal  ENMT: normal external nose and ears.  normal oropharynx  Lungs:normal respiratory effort.  no cyanosis.  Extremities:extremities normal, atraumatic, no cyanosis or edema  Skin: warm and dry, no rashes.  Psych: alert and oriented to person, place and time  MSK: tenderness of below muscle groups     Trigger/ Tender Points Identification:   No images are attached to the encounter.    Assessment and Plan   Deberah Adolf is a 84 y.o. y.o. female who presents with   1. Cervicalgia  INJECT TRIGGER POINTS, > 3    Acupuncture   2. Cervical myofascial strain, sequela  INJECT TRIGGER POINTS, > 3    Acupuncture   3. Myofascial pain  INJECT TRIGGER POINTS, > 3    Acupuncture   4. TMJ syndrome     5. Myasthenia gravis without (acute) exacerbation (HCC/RAF)       # Chronic neck pain  # Cervical myofascial pain  Temporary improvement with treatments, overall stable but persistent. Patient with chronic neck pain for years, underlying myasthenia gravis, and MRI with multilevel degenerative changes and neural foraminal narrowing. Exam reveals multiple trigger points suggesting significant myofascial component to pain.   - repeat acupuncture today  - repeat trigger point injections today   - continue TENS unit use, use on St-36  - continue neck and heat shiatsu massage device   - gentle neck stretching. Discussed community massage.     # Chronic left knee pain  - acupuncture done today     # TMJ syndrome  - treat neck as above   ???  # Myasthenia gravis   - Qi tonic foods   - continue American ginseng tea, 1-2 times per day  - follow-up with neurology, receiving IVIG     Treatments Provided Today:     Acupuncture point interventions:    LI-4/Lv-3, Sp-6/St-36 LI-10 Sp-10   K-3             Acupuncture Treatment Duration: 20 Minutes  Injection of : Trigger Points with 0.23mL 1% lidocaine      Trapezius Levator scapulae, Rhomboids                Trigger Point Injection Procedure Note   Procedure: Trigger point injection of Trapezius Levator scapulae, Rhomboids              Indication: Myofascial pain; pain or altered sensation in the expected distribution of referred pain from a trigger point; taut band palpable in an accessible muscle with exquisite tenderness at one point along the length of it; some degree of restricted motion when measurable; reproduction of pain by stimulating trigger point  Consent: The risks (infection, bleeding, pain) and benefits of trigger points injections were explained to the patient and alternatives were discussed. All questions were answered to the patient's satisfaction. The patient verbalized understanding and consented to the trigger point injection(s).  Sterilization: The target area of the active trigger point(s) was sterilized using a 70% isopropyl alcohol pad.   Procedure: Manual palpation was used to identify the target area. Using a 1-inch 23 gauge and/or 0.5-inch 25 gauge needle, the following procedure was completed:   Injection of : Trigger Points with 0.83mL 1% lidocaine   into Trapezius Levator scapulae, Rhomboids              The needle was infiltrated 1-3 cm in a fanwise method into each trigger point location. Manual pressure was applied after the procedure. There was no significant blood loss. The patient tolerated the procedure well.    Follow-up     Return in about 4 weeks (around 12/14/2019) for [facility] or acupuncturist /  K Hui or A Chu.    Future Appointments   Date Time Provider Department Center   11/25/2019  8:20 AM York Ram., MD GERI IMS 420 MEDICINE   12/10/2019  3:30 PM Trenda Moots., MD ESTWS [phone] MEDICINE   01/04/2020  3:00 PM Trenda Moots., MD ESTWS [phone] MEDICINE   02/16/2020 11:30 AM [facility]U [facility] NEUMUSC B200 NEUROLOGY   03/29/2020  1:15 PM Alvina Chou, AuD AUD NW295 AUDIOLOGY/SP   03/29/2020  1:45 PM Alvina Chou, AuD AUD AO130 AUDIOLOGY/SP   05/03/2020  1:30 PM Sherilyn Banker, OD DS VIS REHAB OPH     Seen and discussed with Dr. Karie Mainland    Author: Will Bonnet, MD, 11/16/2019, 5:22 PM   [facility] Fellow and Clinical Instructor    I have seen and examined the patient with Dr. Blake Divine. Case discussed and I agree with the assessment and plan of treatment. Patient to check her BP again and if still elevated, she may need to discuss with her primary care physician regarding her BP regimen    Chrys Racer M.D. F.A.C.P.  Professor and Investment banker, operational for [facility]  Department of Medicine  [employer]

## 2019-11-24 NOTE — Progress Notes
OUTPATIENT GERIATRICS CLINIC NOTE    PATIENT:  [name]   MRN:  [id number]  DOB:  [birth year]  DATE OF SERVICE:  11/25/2019  PRIMARY CARE PHYSICIAN: York Ram., MD    CHIEF COMPLAINT:   No chief complaint on file.      [place] [facility]:  [employer]    [facility] [facility]:    Chaperone status:  No data recorded    HISTORY OF PRESENT ILLNESS     [name] is a 84 y.o. female who presents today for *follow up**. Patient is accompanied by *no one**. History today is per the patient,and review of available recent records in [facility] and Care Everywhere.     Patient is a(n) [x]  reliable []  unreliable historian and additional collateral information was obtained during the visit today from:     Bothered by sinus fullness.  Some more dry mouth with duloxetine.    Per chart review, pertinent medical history:  No past medical history on file.  No past surgical history on file.    ALLERGIES     Allergies   Allergen Reactions   ??? Beta Adrenergic Blockers Other (See Comments)     Avoid d/t Myasthenia Gravis  Avoid d/t Myasthenia Gravis     ??? Levofloxacin Other (See Comments)     Flare of Myasthenia Gravis   ??? Macrolides And Ketolides Other (See Comments)     Avoid d/t Myasthenia Gravis  Use with caution d/t Myasthenia Gravis  Use with caution d/t Myasthenia Gravis     ??? Sulfa Antibiotics Other (See Comments)     May possibly have caused deafness in one ear  May possibly have caused deafness in one ear  other     ??? Botulinum Toxins Other (See Comments)     Muscle weakness r/t Myasthenia Gravis  Muscle weakness r/t Myasthenia Gravis     ??? Statins Other (See Comments)     Muscle aches  Muscle aches  Muscle aches     ??? Plastic Tape Itching and Other (See Comments)     Turns the skin red where applied        MEDICATIONS     Personally reviewed.    No outpatient medications have been marked as taking for the 11/25/19 encounter (Appointment) with York Ram., MD.       SOCIAL HISTORY     Social History Social History Narrative   ??? Not on file        FUNCTIONAL STATUS     BADLs:  IADLs:    Ambulates with   Falls in past year:  Afraid of falling:    GERIATRIC REVIEW OF SYSTEMS     Vision:    []   glasses     []  legally blind  Hearing: []  hearing aids    Nutrition: []  normal   []  impaired  []  vegan  []  vegetarian  []  low salt  []  low-carb   Swallowing: []  impaired   Dentures:   []  yes    Depression:  []  yes   Cognition:     Incontinence: [] urine  []  fecal  []  urine and fecal      ADVANCED CARE PLANNING     Advanced directives on file: []  No  []   Yes ??? Completed:     Medical DPOA on file:   []   Yes ???   []   No ??? Patient designates ** * to be their surrogate medical decision-maker.  HEALTH CARE MAINTENANCE     IMMUNIZATIONS:   Immunization History   Administered Date(s) Administered   ??? COVID-19, mRNA, LNP-S, PF, 30 mcg/0.3 mL Dose (Pfizer-BioNTech) 03/11/2019, 04/01/2019   ??? DTaP 12/31/2015   ??? influenza vaccine IM quadrivalent (Fluzone Quad) MDV (43 months of age and older) 11/26/2016   ??? influenza vaccine IM quadrivalent adjuvanted (FluAD Quad) (PF) SYR (25 years of age and older) 11/05/2018   ??? influenza, unspecified formulation 04/01/2019, 04/01/2019   ??? pneumococcal polysaccharide vaccine 23-valent (Pneumovax) 01/10/2019       Mammogram:  DXA:  PAP:  Colonoscopy:      PHYSICAL EXAM     BP 134/77 (BP Location: Left arm, Patient Position: Sitting, Cuff Size: Regular)  ~ Pulse 98  ~ Temp (!) 35.6 ???C (96 ???F) (Skin)  ~ Resp 16  ~ Wt 145 lb (65.8 kg)  ~ SpO2 95%  ~ BMI 24.13 kg/m???   Wt Readings from Last 3 Encounters:   11/25/19 145 lb (65.8 kg)   10/27/19 148 lb (67.1 kg)   10/20/19 145 lb (65.8 kg)         System Check if normal Positive or additional negative findings   GEN  [x]  NAD     Eyes  []  Conj/Lids []  Pupils  []  Fundi   []  Sclerae []  EOM     ENT  []  External ears   []  Otoscopy   []  Gross Hearing    []   External nose   []  Nasal mucosa   []  Lips/teeth/gums    []  Oropharynx    []  Mucus membranes      Neck [x]  Inspection/palpation    [x]  Thyroid     Resp  [x]  Effort    [x]  Auscultation       CV  []  Rhythm/rate   [x]  Murmurs   [x]  Edema   []  JVP non-elevated    Normal pulses:   []  Radial []  Femoral  []  Pedal     Breast  []  Inspection []  Palpation     GI  []  Bowel sounds    [x]  Nontender   [x]  No distension    []  No rebound or guarding   []  No masses   []  Liver/spleen    []  Rectal     GU  F:  []  External []  vaginal wall         []  Cervix     []  mucus        []  Uterus    []  Adnexa   M:  []  Scrotum []  Penis         []  Prostate     Lymph  [x]  Cervical []  supraclavicular     [x]  Axillae   []  Groin/inguinal     MSK []  Gait  []  Back     Specify site examined:    []  Inspect/palp []  ROM   []  Stability []  Strength/tone []  Used arms to push up from seated to standing position   Assistive device:  []  single point cane []  quad cane  []  FWW []  rollator walker      Skin  []  Inspection []  Palpation     Neuro  []  Alert and oriented     []  CN2-12 intact grossly   []  DTR      []  Muscle strength      []  Sensation   []  Pronator drift   []  Finger to Nose/Heel to Shin   []  Romberg     Psych  []   Insight/judgement     []  Mood/affect    []  Gross cognition            LABS/STUDIES     LABS:  Lab Results   Component Value Date    WBC 4.60 09/22/2019    WBC 4.35 07/14/2019    WBC 4.40 04/14/2019    HGB 12.3 09/22/2019    HGB 12.7 07/14/2019    HGB 13.0 04/14/2019    MCV 100.0 (H) 09/22/2019    PLT 342 09/22/2019    PLT 323 07/14/2019    PLT 314 04/14/2019     Lab Results   Component Value Date    NA 139 09/22/2019    NA 140 04/14/2019    NA 141 12/17/2018    K 4.1 09/22/2019    K 4.5 04/14/2019    K 4.1 12/17/2018    CREAT 1.10 09/22/2019    CREAT 1.05 04/14/2019    CREAT 1.13 12/17/2018    GFRESTNOAA 47 09/22/2019    GFRESTNOAA 50 04/14/2019    GFRESTNOAA 46 12/17/2018    GFRESTAA 54 09/22/2019    GFRESTAA 58 04/14/2019    GFRESTAA 53 12/17/2018    CALCIUM 9.0 09/22/2019    CALCIUM 9.4 04/14/2019    CALCIUM 9.8 12/17/2018     Lab Results Component Value Date    ALT 13 09/22/2019    ALT 14 04/14/2019    AST 25 09/22/2019    AST 21 04/14/2019    ALKPHOS 80 09/22/2019    BILITOT 0.3 09/22/2019    ALBUMIN 3.7 (L) 09/22/2019    ALBUMIN 3.9 04/14/2019     Lab Results   Component Value Date    TSH 1.00 12/17/2018     Lab Results   Component Value Date    HGBA1C 4.9 09/22/2019     No results found for: CHOL, CHOLDLCAL  No results found for: CHOLDLQ  No results found for: [facility]  Lab Results   Component Value Date    FE 87 07/14/2019    FERRITIN 57 07/14/2019    FOLATE 11.0 09/22/2019    TIBC 356 07/14/2019     Lab Results   Component Value Date    VITAMINB12 217 (L) 09/22/2019     Lab Results   Component Value Date    BNP 92 09/22/2019     No results found for: PSATOTAL      STUDIES:    EKG  This data element was independently reviewed and interpreted by me     NSR NO ST or T changes    XR CHEST PA LAT 2V  September 22, 2019???  COMPARISON: KUB December 17, 2018  ???  History: sob  ???  FINDINGS:  ???  Lungs: Clear  Heart/aorta: Normal heart size. The thoracic aorta is mildly calcified, tortuous and ectatic.  Adenopathy: None  Pleura: No effusion  Bones and Chest wall: No acute bony or body wall  findings. Osteopenia and bony maturational changes. Right upper quadrant cholecystectomy clips stable from October 2020  ???  ???  IMPRESSION:  ???  No acute findings or abnormalities related to provided history.    ASSESSMENT and PLAN     [name] is a 84 y.o. female who presents today for *follow up**.        #HTN--Increase nifedipine CR 60 mg daily. Continue losartan 25 mg.    #Aortic Calcification--noted on chest imaging     September 22, 2019???       --Not Worsening.  Chronic.  Will monitor and treat underlying risk factors with medical and lifestyle interventions as warranted by balance between benefits and burdens.    #Macrocytosis--check B12, folic acid today  #Deconditioning--initiate home PT  #GERD--s/p Nisan fundoplication  #COPD--mixed--per PFT's from [name].--consider reinitiation of inhalers.  #s/p rectocele repair  #s/p cholecystectomy  #Nocturnal hypoxemia--presumably related to MG crisis but has history of COPD. ???Will order nocturnal home O2 study and consider PFT's/Pulmonary evaluation once settled down. ???May need to restart BREO inhaler.  #Myasthenia Gravis--s/p plasmapheresis x5 October 2020, no Thymoma, on Azathioprine 150 daily and mestinon 60 tid.??????Saw???Dr. Enedina Finner at [facility]. ???Was considering Soliris.??????Ordering IVIG and MRI.  Encourage ongoing neurology follow up.   #IBS--titrate miralax to comfort. ???Consider duloxetine substitute for buproprion.  #Interstitial cystitis--on daily Trimethoprim 100 for UTI prophylaxis. ???Using bladder instillations prn. OFF topical estrogen due to advice from neurologist re: MG.  To see Dr. Desma Maxim to see if she has additional insights.  #Major Depression--active but appears better s/p  increase Duloxetine to 40 mg daily. Referred patient to Dr. Randie Heinz previously.  #Allergic rhinitis--previously on nasal steroids.  #Breast CA--DCIS???by report--obtain mammo. ???Patient declined surgery in 2019 and experienced dry mouth from Tamoxifen. ???Was considering Anastrozole. Await records from prior mammogram and treatment.  Per records from [facility]:   ''07/20/2017 Screening detected right breast calcifications and distortion. The distortion was in the upper outer quadrant: Biopsy DCIS intermediate grade with CSL; calcifications UIQ: 2.8 cm: Biopsy DCIS+ ALH +CSL; 4 cm apart, axilla negative, Tis NX stage 0''  Due for repeat mammo Apirl 2022  #DM with CKD--stabe 3b--based on labs from [name]. ???FOllow here. ???If BP permits, may consider ARB BUT patient with h/o angioedema so will ONLY initiate this agent with great caution.  Refer to Ophthalmology.  #OA knees--previously getting hyaluronic acid injections.  #Anxiety  #Benzodiazepine dependence--down titrating to one clonaeqam 1 mg daily now.  #Fibromyalgia--duloxetine as above. 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  #Statin intolerance      FOLLOW-UP     RTC     Future Appointments   Date Time Provider Department Center   11/25/2019  8:20 AM York Ram., MD GERI IMS 420 MEDICINE   12/10/2019  3:30 PM Trenda Moots., MD ESTWS [phone] MEDICINE   01/04/2020  3:00 PM Trenda Moots., MD EST[facility] [phone] MEDICINE   02/16/2020 11:30 AM NEU MG CLINIC [facility] NEUMUSC B200 NEUROLOGY   03/29/2020  1:15 PM Alvina Chou, AuD AUD [phone] AUDIOLOGY/SP   03/29/2020  1:45 PM Alvina Chou, AuD AUD [phone] AUDIOLOGY/SP   05/03/2020  1:30 PM Shoku, Drue Flirt, OD DS VIS REHAB OPH         The above plan of care, diagnosis, orders, and follow-up were discussed with the patient and/or surrogate. Questions related to this recommended plan of care were answered.    ANALYSIS OF DATA (Needs to meet 1 category for moderate and 2 categories for high LOS)     I have:     Category 1 (Needs 3 for moderate and high LOS)     []  Reviewed []  1 []  2 []  ? 3 unique laboratory, radiology, and/or diagnostic tests noted below    Test/Study:  on date .    []  Reviewed []  1 []  2 []  ? 3 prior external notes and incorporated into patient assessment    I reviewed Dr. 's note in specialty  from date .    []  Discussed management or test  interpretation with external provider(s) as noted      []  Ordered []  1 []  2 []  ? 3 unique laboratory, radiology, and/or diagnostic tests noted in A&P    []  Obtained history from independent historian:       Category 2  []  Independently interpreted the test    Category 3  []  Discussed management or test interpretation with external provider(s) as noted      INTERACTION COMPLEXITY and SOCIAL DETERMINANTS of HEALTH       PROBLEM COMPLEXITY   []  New problem with uncertain diagnosis or prognosis (moderate)   []  Multiple stable chronic problems (moderate)   []  Chronic problem not stable - not controlled, symptomatic, or worsening (moderate)   []  Severe exacerbation of chronic problem (high)   []  New or chronic problem that poses threat to life or bodily function (high)     MANAGEMENT COMPLEXITY   []  Old or external/outside records reviewed   []  Discussion with alternate (proxy) if patient with impaired communication / comprehension ability (e.g., dementia, aphasia, severe hearing loss).   []  Repeated questions (or disagreement) between patient and/among caregivers/family during the visit.  []  Caregiver/patient emotions/behavior/beliefs interfering with implementation of treatment plan.  []  Independent interpretation of test (EKG, Chest XRay)   []  Discussion of case with a consultant physician     RISK LEVEL   []  Prescription drug management (moderate)   []  Minor surgery with CV risk factors or elective major surgery (moderate)   []  Dx or Rx significantly limited by SDoH (inadequate housing, living alone, poor health care access, inappropriate diet; low literacy) (moderate)   []  Major surgery - elective with CV risk factors or emergent (high)   []  Need for hospitalization (high)   []  New DNR or de-escalation of care (high)      SDoH  The diagnosis or treatment of said conditions is significantly limited by the following social determinants of health:  []  Z59.0 Homelessness  []  Z59.1 Inadequate housing  []  Z59.2 Discord with neighbors, lodgers and landlord  []  Z60.2 Problems related to living alone  []  Z59.8 Other problems related to housing and economic circumstances  []  Z59.4 Lack of adequate food and safe drinking water  []  Z59.6 Low income  []  Z59.7 Insufficient social insurance and welfare support  []  Z59.9 Problems related to housing and economic circumstances, unspecified  []  Z75.3 Unavailability and inaccessibility of health care facilities  []  Z75.4 Unavailability and inaccessibility of other helping agencies  []  Z72.4 Inappropriate diet and eating habits  []  Z62.820 Parent-biological child conflict  []  Z63.8 Other specified problems related to primary support group  []  Z55.0 Illiteracy and low level literacy   []  Z56.9 Unspecified problems related to employment        If Billing Based on Time:     I performed the following items on the day of service:    [x]  Preparing to see the patient (e.g., review of tests)  [x]  Obtaining and/or reviewing separately obtained history   [x]  Performing a medically appropriate examination and/or evaluation   [x]  Counseling and educating the patient/family/caregiver   [x]  Ordering medications, tests, or procedures  [x]  Referring and communicating with other healthcare professionals (when not separately reported)  [x]  Documenting clinical information in the EHR  []  Independently interpreting results and communicating results to patient/family/caregiver    I spent the following total amount of time on these tasks on the day of service:  New Patient     Established Patient  []  15-29  minutes ??? [id number]    []  up to 9 minutes ??? [id number]  []  30-44 minutes ??? [id number]     []  10-19 minutes ??? [id number]   []  45-59 minutes ??? [id number]      []  20-29 minutes ??? [id number]   []  60-74 minutes ??? [id number]   []  30-39 minutes ??? 99214         [x]  40-55 minutes ??? [id number]    []  I spent an additional ** * 15-minute-increment(s) for a total of * ** minutes on these tasks on the day of service. [phone] for each additional 15 minutes.)    Author: York Ram, MD 11/25/2019

## 2019-11-25 ENCOUNTER — Ambulatory Visit: Payer: MEDICARE | Attending: Geriatric Medicine

## 2019-11-25 DIAGNOSIS — R1012 Left upper quadrant pain: Secondary | ICD-10-CM

## 2019-11-25 DIAGNOSIS — R5382 Chronic fatigue, unspecified: Secondary | ICD-10-CM

## 2019-11-25 MED ORDER — BUPROPION HCL ER (SR) 200 MG PO TB12
200 mg | ORAL_TABLET | Freq: Two times a day (BID) | ORAL | 3 refills | 90.00000 days | Status: AC
Start: 2019-11-25 — End: ?

## 2019-11-25 MED ORDER — CLONAZEPAM 1 MG PO TABS
1 mg | ORAL_TABLET | Freq: Every day | ORAL | 1 refills
Start: 2019-11-25 — End: 2019-11-25
  Filled 2019-11-25: qty 90, 90d supply, fill #0

## 2019-11-25 MED ORDER — DEXLANSOPRAZOLE 60 MG PO CPDR
60 mg | ORAL_CAPSULE | Freq: Every day | ORAL | 3 refills | 90.00000 days | Status: AC
Start: 2019-11-25 — End: ?

## 2019-11-25 MED ORDER — CLONAZEPAM 1 MG PO TABS
1 mg | ORAL_TABLET | Freq: Every day | ORAL | 1 refills | 90.00000 days | Status: AC
Start: 2019-11-25 — End: ?

## 2019-12-07 ENCOUNTER — Ambulatory Visit: Payer: BLUE CROSS/BLUE SHIELD

## 2019-12-10 ENCOUNTER — Ambulatory Visit: Payer: MEDICARE

## 2019-12-10 DIAGNOSIS — G8929 Other chronic pain: Secondary | ICD-10-CM

## 2019-12-10 DIAGNOSIS — F419 Anxiety disorder, unspecified: Secondary | ICD-10-CM

## 2019-12-10 DIAGNOSIS — M7918 Myalgia, other site: Secondary | ICD-10-CM

## 2019-12-10 DIAGNOSIS — M542 Cervicalgia: Secondary | ICD-10-CM

## 2019-12-10 DIAGNOSIS — G7000 Myasthenia gravis without (acute) exacerbation: Secondary | ICD-10-CM

## 2019-12-10 DIAGNOSIS — M5382 Other specified dorsopathies, cervical region: Secondary | ICD-10-CM

## 2019-12-10 NOTE — Patient Instructions
Future Appointments   Date Time Provider [place]   12/30/2019  9:20 AM Molinda Bailiff., MD GERI IMS 420 MEDICINE   01/04/2020  3:00 PM Angelina Sheriff [place] ESTWS [id number] MEDICINE   02/16/2020 11:30 AM NEU MG CLINIC [facility] [place] B200 NEUROLOGY   03/29/2020  1:15 PM Lilyan Gilford, AuD AUD [id number] AUDIOLOGY/SP   03/29/2020  1:45 PM Lilyan Gilford, AuD AUD [phone] AUDIOLOGY/SP   05/03/2020  1:30 PM Shoku, Jeannette How, OD DS VIS REHAB OPH

## 2019-12-10 NOTE — Progress Notes
[facility] Progress Note     PATIENT: [name]  MRN: [id number]  DOB: 1935/07/20  DATE OF SERVICE: 12/10/2019  REFERRING PRACTITIONER: No ref. provider found  PRIMARY CARE PROVIDER: York Ram., MD    Chief Complaint   Patient presents with   ??? Neck Pain   ??? Fibromyalgia     Subjective:      History of Present Illness:  [name] is a 84 y.o. female with PMH of  has no past medical history on file. who presents for follow-up cervical and lumbar spondylosis.     Initial Consult 04/16/19  # Chronic neck and shoulder pain  - present for many years, gradual onset, no inciting event or trauma. Underlying myasthenia gravis.   - Prior work-up: MR cervical spine with multilevel degenerative disc disease in setting of C4-C7 congenital cervical spinal canal stenosis, neural foraminal narrowing, facet joint degeneration   - Interventions: no medications for pain; was going to start PT but didn't go due to pandemic  - Self-care: cervical collar sometimes if needed to bend down  - Timing/Duration: constant, associated with activities   - Location/Radiation: bilateral neck, no radiation  - Quality: aching, sharp at times  - Severity: varies between 5 to 10 out of 10  - Modifiers: worse with activity. better with rest.   - Associated s/s: diffuse generalized weakness of bilateral lower extremities (thinks its related to myasthenia gravis), leg pain  - Denies: numbness, tingling, upper extremity weakness, changes in bowel or bladder, changes in gait  ???  # Myasthenia gravis  - hospitalized 11/2018 for exacerbation, receiving IVIG infusions regularly  - uses a walker / cane sometimes  ???  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 Husband died 2 years ago of CHF.   BM: intermittent constipation, diarrhea   Temperature: tends to run cold  Supplements: none  __________________________________________________    Follow-up 05/21/19  - feels the treatments last visit did help reduce her pain temporarily   - still with persistent neck pain and left knee weakness  - got neck and shoulder massager and using it at home  - purchased TENS unit but forgot to bring it today  - moved to [place]    Follow-up 06/12/19  - treatments help temporarily with the neck pain  - using shiatsu neck massager  - has TENS unit, wondering where to place     Follow-up 07/03/19  - neck pain and left knee pain persists   - overall doesn't feel well, wondering if stopping her mestinon contributed, had upcoming neurology appointment  - brought TENS unit in today, was able to get it to work, but wondered where she should put the pads    Follow-up 07/17/19  - neck pain persistent, but treatments help temporarily  - saw neuromuscular, ordered sleep study, had IVIG yesterday with improvement of symptoms   - using TENS unit for neck and heating pad, with some help    Follow-up 07/31/19  - neck and shoulders doing ok  - left lateral thigh with increased myofascial pain, as well as left knee has been bothering her more    Follow-up 08/21/19  - IVIG helping, felt the best in a while this  past Thursday   - jaw tightness acting up, biting mouth, has TMJ syndrome   - left IT band still with pain, neck and shoulder doing better, treatments helping     Follow-up 09/18/19   - neck a lot better, but still having jaw clenching and grinding  - left knee bothering her   - dog fell off bed and needed surgery     Follow-up 10/02/19  - treatments temporarily helping  - still with bilateral neck, bilateral jaw, and left more than right knee pain today   - she has been watching the Olympics  - also staying in as pandemic with more cases     Follow-up 10/22/19  - treatments overall helping, more with her neck pain  - left knee more bothersome today   - sleep study ordered by neuromuscular to evaluate for OSA and restless leg syndrome  - started taking American Ginseng tea, enjoys drinking it     Follow-up 11/16/19  - acupuncture helps her feel relaxed, injections help with neck pain temporarily   - right neck with more tension today  - using TENS machine  - tried neck massager but it is too hard / rough   - enjoying American ginseng tea    Follow-up 12/10/19  Patient overall stable. Neck symptoms wax and wane; treatments do help. Patient using TENS unit intermittently with some help.        Medications that the patient states to be currently taking   Medication Sig   ??? Albuterol Sulfate AEPB Takes 1 to 2 puffs every 4 hours as needed for wheezing or SOB Inhale Takes 1 to 2 puffs every 4 hours as needed for wheezing or SOB .Marland Kitchen   ??? azaTHIOprine 50 mg tablet Take one and one-half tablet (75 mg total) by mouth two (2) times daily.   ??? buPROPion, SR, (WELLBUTRIN SR) 200 mg 12 hr tablet Take 1 tablet (200 mg total) by mouth two (2) times daily.   ??? clonazePAM 1 mg tablet Take 1 tablet (1 mg total) by mouth daily. Max Daily Amount: 1 mg   ??? dexlansoprazole 60 mg DR capsule Take 1 capsule (60 mg total) by mouth daily.   ??? DULoxetine 20 mg DR capsule Take 2 capsules (40 mg total) by mouth daily.   ??? fluticasone-vilanterol (BREO ELLIPTA) 100-25 mcg/inh inhaler Inhale 1 puff daily.   ??? GAMMAGARD 30 GM/300ML SOLN every twenty one (21) days .   ??? lidocaine 5% patch Place 1 patch onto the skin every twelve (12) hours 12 hours on 12 hours off.Marland Kitchen   ??? loratadine 10 mg tablet Take 1 tablet (10 mg total) by mouth daily as needed for Allergies.   ??? losartan 25 mg tablet Take 1 tablet (25 mg total) by mouth daily.   ??? montelukast 10 mg tablet Take 10 mg by mouth as needed for.   ??? NIFEdipine (PROCARDIA XL) 60 mg 24 hr tablet Take 1 tablet (60 mg total) by mouth daily.   ??? trimethoprim 100 mg tablet Take 1 tablet (100 mg total) by mouth daily.   No past medical history on file.    Objective:   BP 158/80  ~ Pulse (!) 101  ~ Temp 36.8 ???C (98.2 ???F) (Forehead)     Physical Exam:  General: alert, appears stated age and cooperative  Eyes: conjunctiva and lids normal  ENMT: normal external nose and ears.  normal oropharynx  Lungs:normal respiratory effort.  no cyanosis.  Extremities:extremities normal, atraumatic, no  cyanosis or edema  Skin: warm and dry, no rashes.  Psych: alert and oriented to person, place and time  MSK: tenderness of below muscle groups     Trigger/ Tender Points Identification:       Assessment and Plan   [name] is a 84 y.o. y.o. female who presents with   1. Chronic neck pain  Acupuncture    INJECT TRIGGER POINTS, > 3   2. Other specified dorsopathies, cervical region  Acupuncture    INJECT TRIGGER POINTS, > 3   3. Myofascial pain  Acupuncture    INJECT TRIGGER POINTS, > 3   4. Fibromyalgia  Acupuncture    INJECT TRIGGER POINTS, > 3   5. Myasthenia gravis without (acute) exacerbation (HCC/RAF)       # Chronic neck pain  # Cervical myofascial pain  Temporary improvement with treatments, overall stable but persistent. Patient with chronic neck pain for years, underlying myasthenia gravis, and MRI with multilevel degenerative changes and neural foraminal narrowing. Exam reveals multiple trigger points suggesting significant myofascial component to pain.   - repeat acupuncture today  - repeat trigger point injections today   - continue TENS unit use, use on St-36  - continue neck and heat shiatsu massage device   - gentle neck stretching. Discussed community massage.     # Chronic left knee pain  - acupuncture done today     # TMJ syndrome  - treat neck as above   ???  # Myasthenia gravis   - Qi tonic foods   - continue American ginseng tea, 1-2 times per day  - follow-up with neurology, receiving IVIG     Treatments Provided Today:     Acupuncture point interventions:    Sp-6/St-36, LI-4/Lv-3 LI-10 Sp-10   K-3 GB-21         Acupuncture Treatment Duration: 20 Minutes  Injection of : Trigger Points with 0.54mL 1% lidocaine      Trapezius, Sternocleidomastoid, Splenius cervicis Levator scapulae, Rhomboids                Trigger Point Injection Procedure Note   Procedure: Trigger point injection of Trapezius, Sternocleidomastoid, Splenius cervicis Levator scapulae, Rhomboids              Indication: Myofascial pain; pain or altered sensation in the expected distribution of referred pain from a trigger point; taut band palpable in an accessible muscle with exquisite tenderness at one point along the length of it; some degree of restricted motion when measurable; reproduction of pain by stimulating trigger point  Consent: The risks (infection, bleeding, pain) and benefits of trigger points injections were explained to the patient and alternatives were discussed. All questions were answered to the patient's satisfaction. The patient verbalized understanding and consented to the trigger point injection(s).  Sterilization: The target area of the active trigger point(s) was sterilized using a 70% isopropyl alcohol pad.   Procedure: Manual palpation was used to identify the target area. Using a 1-inch 23 gauge and/or 0.5-inch 25 gauge needle, the following procedure was completed:   Injection of : Trigger Points with 0.28mL 1% lidocaine   into Trapezius, Sternocleidomastoid, Splenius cervicis Levator scapulae, Rhomboids              The needle was infiltrated 1-3 cm in a fanwise method into each trigger point location. Manual pressure was applied after the procedure. There was no significant blood loss. The patient tolerated the procedure well.    Follow-up  Return in about 4 weeks (around 01/07/2020) for [place] or Shawn / K Hui.    Future Appointments   Date Time Provider Department Center   12/30/2019  9:20 AM York Ram., MD GERI IMS 420 MEDICINE   01/04/2020  3:00 PM Rinaldo Cloud [place], [place] ESTWS [id number] MEDICINE   02/16/2020 11:30 AM NEU MG [facility] NEUMUSC B200 NEUROLOGY   03/29/2020  1:15 PM Alvina Chou, AuD AUD [id number] AUDIOLOGY/SP   03/29/2020  1:45 PM Alvina Chou, AuD AUD [id number] AUDIOLOGY/SP   05/03/2020  1:30 PM Sherilyn Banker, OD DS VIS REHAB OPH     Seen and discussed with Dr. Karie Mainland    Author: Will Bonnet, MD, 12/10/2019, 4:24 PM   [facility] Fellow and Clinical Instructor    I have seen and examined the patient with Dr. Blake Divine. Case discussed and I agree with the assessment and plan of treatment.     Chrys Racer M.D. F.A.C.P.  Professor and Investment banker, operational for [facility]  Department of Medicine  [employer]

## 2019-12-18 ENCOUNTER — Telehealth: Payer: BLUE CROSS/BLUE SHIELD

## 2019-12-18 NOTE — Telephone Encounter
Message to Practice/Provider      Message: Patient's home health nurse [place] called stating that he administered patient's IVIG yesterday and wanted to make MD aware that her blood pressure was elevated. He states prior to the infusion she was at 172/88, throughout the infusion she was at  170/180  And when the nurse left she was at 176-89 which patient advised him that that is her baseline. He states he spoke to her today and patient states she is fine.       CBN: [phone]     Return call is not being requested by the patient or caller.    Patient or caller has been notified of the 24-48 hour processing turnaround time if applicable.

## 2019-12-30 ENCOUNTER — Ambulatory Visit: Payer: BLUE CROSS/BLUE SHIELD

## 2019-12-30 ENCOUNTER — Ambulatory Visit: Payer: Commercial Managed Care - Pharmacy Benefit Manager | Attending: Geriatric Medicine

## 2019-12-30 DIAGNOSIS — H8110 Benign paroxysmal vertigo, unspecified ear: Secondary | ICD-10-CM

## 2019-12-30 MED ORDER — FLUTICASONE PROPIONATE 50 MCG/ACT NA SUSP
1 | Freq: Every day | NASAL | 3 refills | Status: AC
Start: 2019-12-30 — End: ?

## 2019-12-30 MED ORDER — CLONAZEPAM 0.5 MG PO TABS
.5 mg | ORAL_TABLET | Freq: Every day | ORAL | 1 refills | 90.00 days | Status: AC
Start: 2019-12-30 — End: ?

## 2019-12-30 NOTE — Patient Instructions
Take 0.5 mg clonopin each evening.  This is a reduction

## 2019-12-30 NOTE — Progress Notes
OUTPATIENT GERIATRICS CLINIC NOTE    PATIENT:  [name]   MRN:  [id number]  DOB:  April 15, 1935  DATE OF SERVICE:  12/30/2019  PRIMARY CARE PHYSICIAN: York Ram., MD    CHIEF COMPLAINT:   No chief complaint on file.      [place] [facility]ists:  [employer]    Non[facility] Specialists:    Chaperone status:  No data recorded    HISTORY OF PRESENT ILLNESS     [name] is a 84 y.o. female who presents today for *follow up**. Patient is accompanied by *no one**. History today is per the patient,and review of available recent records in [facility] and Care Everywhere.     Patient is a(n) [x]  reliable []  unreliable historian and additional collateral information was obtained during the visit today from:     With new BP meds.gets ''dizzy''  Experiences this as vertigo.      Per chart review, pertinent medical history:  No past medical history on file.  No past surgical history on file.    ALLERGIES     Allergies   Allergen Reactions   ??? Beta Adrenergic Blockers Other (See Comments)     Avoid d/t Myasthenia Gravis  Avoid d/t Myasthenia Gravis     ??? Levofloxacin Other (See Comments)     Flare of Myasthenia Gravis   ??? Macrolides And Ketolides Other (See Comments)     Avoid d/t Myasthenia Gravis  Use with caution d/t Myasthenia Gravis  Use with caution d/t Myasthenia Gravis     ??? Sulfa Antibiotics Other (See Comments)     May possibly have caused deafness in one ear  May possibly have caused deafness in one ear  other     ??? Botulinum Toxins Other (See Comments)     Muscle weakness r/t Myasthenia Gravis  Muscle weakness r/t Myasthenia Gravis     ??? Statins Other (See Comments)     Muscle aches  Muscle aches  Muscle aches     ??? Plastic Tape Itching and Other (See Comments)     Turns the skin red where applied        MEDICATIONS     Personally reviewed.    No outpatient medications have been marked as taking for the 12/30/19 encounter (Appointment) with York Ram., MD.       SOCIAL HISTORY     Social History     Social History Narrative   ??? Not on file        FUNCTIONAL STATUS     BADLs:  IADLs:    Ambulates with   Falls in past year:  Afraid of falling:    GERIATRIC REVIEW OF SYSTEMS     Vision:    []   glasses     []  legally blind  Hearing: []  hearing aids    Nutrition: []  normal   []  impaired  []  vegan  []  vegetarian  []  low salt  []  low-carb   Swallowing: []  impaired   Dentures:   []  yes    Depression:  []  yes   Cognition:     Incontinence: [] urine  []  fecal  []  urine and fecal      ADVANCED CARE PLANNING     Advanced directives on file: []  No  []   Yes ??? Completed:     Medical DPOA on file:   []   Yes ???   []   No ??? Patient designates ** * to be their surrogate medical decision-maker.  HEALTH CARE MAINTENANCE     IMMUNIZATIONS:   Immunization History   Administered Date(s) Administered   ??? COVID-19, mRNA, (Pfizer) 30 mcg/0.3 mL 03/11/2019, 04/01/2019   ??? DTaP 12/31/2015   ??? influenza vaccine IM quadrivalent (Fluzone Quad) MDV (90 months of age and older) 11/26/2016   ??? influenza vaccine IM quadrivalent adjuvanted (FluAD Quad) (PF) SYR (73 years of age and older) 11/05/2018   ??? influenza, unspecified formulation 04/01/2019, 04/01/2019, 12/01/2019   ??? pneumococcal polysaccharide vaccine 23-valent (Pneumovax) 01/10/2019       Mammogram:  DXA:  PAP:  Colonoscopy:      PHYSICAL EXAM     BP (P) 160/83 (BP Location: Left arm, Patient Position: Sitting, Cuff Size: Regular)  ~ Pulse (!) (P) 101  ~ Temp (P) 36 ???C (96.8 ???F) (Temporal)  ~ Resp (P) 18  ~ Wt (P) 145 lb (65.8 kg)  ~ SpO2 (P) 96%  ~ BMI (P) 24.13 kg/m???   Wt Readings from Last 3 Encounters:   12/30/19 (P) 145 lb (65.8 kg)   11/25/19 145 lb (65.8 kg)   10/27/19 148 lb (67.1 kg)         System Check if normal Positive or additional negative findings   GEN  [x]  NAD     Eyes  []  Conj/Lids []  Pupils  []  Fundi   []  Sclerae []  EOM     ENT  []  External ears   []  Otoscopy   []  Gross Hearing    []   External nose   []  Nasal mucosa   []  Lips/teeth/gums    []  Oropharynx    []  Mucus membranes      Neck  [x]  Inspection/palpation    [x]  Thyroid     Resp  [x]  Effort    [x]  Auscultation       CV  []  Rhythm/rate   [x]  Murmurs   [x]  Edema   []  JVP non-elevated    Normal pulses:   []  Radial []  Femoral  []  Pedal     Breast  []  Inspection []  Palpation     GI  []  Bowel sounds    [x]  Nontender   [x]  No distension    []  No rebound or guarding   []  No masses   []  Liver/spleen    []  Rectal     GU  F:  []  External []  vaginal wall         []  Cervix     []  mucus        []  Uterus    []  Adnexa   M:  []  Scrotum []  Penis         []  Prostate     Lymph  [x]  Cervical []  supraclavicular     [x]  Axillae   []  Groin/inguinal     MSK []  Gait  []  Back     Specify site examined:    []  Inspect/palp []  ROM   []  Stability []  Strength/tone []  Used arms to push up from seated to standing position   Assistive device:  []  single point cane []  quad cane  []  FWW []  rollator walker      Skin  []  Inspection []  Palpation     Neuro  []  Alert and oriented     []  CN2-12 intact grossly   []  DTR      []  Muscle strength      []  Sensation   []  Pronator drift   []  Finger to Nose/Heel to [employer]   []  Romberg  Psych  []  Insight/judgement     []  Mood/affect    []  Gross cognition            LABS/STUDIES     LABS:  Lab Results   Component Value Date    WBC 4.71 11/25/2019    WBC 4.60 09/22/2019    WBC 4.35 07/14/2019    HGB 13.8 11/25/2019    HGB 12.3 09/22/2019    HGB 12.7 07/14/2019    MCV 100.0 (H) 11/25/2019    PLT 374 11/25/2019    PLT 342 09/22/2019    PLT 323 07/14/2019     Lab Results   Component Value Date    NA 139 11/25/2019    NA 139 09/22/2019    NA 140 04/14/2019    K 3.9 11/25/2019    K 4.1 09/22/2019    K 4.5 04/14/2019    CREAT 1.22 11/25/2019    CREAT 1.10 09/22/2019    CREAT 1.05 04/14/2019    GFRESTNOAA 42 11/25/2019    GFRESTNOAA 47 09/22/2019    GFRESTNOAA 50 04/14/2019    GFRESTAA 48 11/25/2019    GFRESTAA 54 09/22/2019    GFRESTAA 58 04/14/2019    CALCIUM 9.6 11/25/2019    CALCIUM 9.0 09/22/2019    CALCIUM 9.4 04/14/2019 Lab Results   Component Value Date    ALT 13 11/25/2019    ALT 13 09/22/2019    AST 29 11/25/2019    AST 25 09/22/2019    ALKPHOS 92 11/25/2019    BILITOT 0.3 11/25/2019    ALBUMIN 4.1 11/25/2019    ALBUMIN 3.7 (L) 09/22/2019     Lab Results   Component Value Date    TSH 1.00 12/17/2018     Lab Results   Component Value Date    HGBA1C 4.9 09/22/2019     No results found for: CHOL, CHOLDLCAL  No results found for: CHOLDLQ  No results found for: [facility]  Lab Results   Component Value Date    FE 87 07/14/2019    FERRITIN 57 07/14/2019    FOLATE 9.6 11/25/2019    TIBC 356 07/14/2019     Lab Results   Component Value Date    VITAMINB12 3,994 (H) 11/25/2019    VITAMINB12 217 (L) 09/22/2019     Lab Results   Component Value Date    BNP 92 09/22/2019     No results found for: PSATOTAL      STUDIES:    EKG  This data element was independently reviewed and interpreted by me     NSR NO ST or T changes    XR CHEST PA LAT 2V  September 22, 2019???  COMPARISON: KUB December 17, 2018  ???  History: sob  ???  FINDINGS:  ???  Lungs: Clear  Heart/aorta: Normal heart size. The thoracic aorta is mildly calcified, tortuous and ectatic.  Adenopathy: None  Pleura: No effusion  Bones and Chest wall: No acute bony or body wall  findings. Osteopenia and bony maturational changes. Right upper quadrant cholecystectomy clips stable from October 2020  ???  ???  IMPRESSION:  ???  No acute findings or abnormalities related to provided history.    ASSESSMENT and PLAN     [name] is a 83 y.o. female who presents today for *follow up**.        #HTN--Increase nifedipine CR 60 mg daily. Continue losartan 25 mg.    #Aortic Calcification--noted on chest imaging     September 22, 2019???       --  Not Worsening.  Chronic.  Will monitor and treat underlying risk factors with medical and lifestyle interventions as warranted by balance between benefits and burdens.  #allergic rhinitis--trial flonase  #Macrocytosis--check B12, folic acid today  #Deconditioning--initiate home PT  #GERD--s/p Nisan fundoplication  #COPD--mixed--per PFT's from [name].--consider reinitiation of inhalers.  #s/p rectocele repair  #s/p cholecystectomy  #Nocturnal hypoxemia--presumably related to MG crisis but has history of COPD. ???Will order nocturnal home O2 study and consider PFT's/Pulmonary evaluation once settled down. ???May need to restart BREO inhaler.  #Myasthenia Gravis--s/p plasmapheresis x5 October 2020, no Thymoma, on Azathioprine 150 daily and mestinon 60 tid.??????Saw???Dr. Enedina Finner at [facility]. ???Was considering Soliris.??????Ordering IVIG and MRI.  Encourage ongoing neurology follow up.   #IBS--titrate miralax to comfort. ???Consider duloxetine substitute for buproprion.  #Interstitial cystitis--on daily Trimethoprim 100 for UTI prophylaxis. ???Using bladder instillations prn. OFF topical estrogen due to advice from neurologist re: MG.  To see Dr. Desma Maxim to see if she has additional insights.  #Major Depression--active but appears better s/p  increase Duloxetine to 40 mg daily. Referred patient to Dr. Randie Heinz previously.  #Allergic rhinitis--previously on nasal steroids.  #Breast CA--DCIS???by report--obtain mammo. ???Patient declined surgery in 2019 and experienced dry mouth from Tamoxifen. ???Was considering Anastrozole. Await records from prior mammogram and treatment.  Per records from [facility]:   ''07/20/2017 Screening detected right breast calcifications and distortion. The distortion was in the upper outer quadrant: Biopsy DCIS intermediate grade with CSL; calcifications UIQ: 2.8 cm: Biopsy DCIS+ ALH +CSL; 4 cm apart, axilla negative, Tis NX stage 0''  Due for repeat mammo Apirl 2022  #DM with CKD--stabe 3b--based on labs from [name]. ???FOllow here. ???If BP permits, may consider ARB BUT patient with h/o angioedema so will ONLY initiate this agent with great caution.  Refer to Ophthalmology.  #OA knees--previously getting hyaluronic acid injections.  #Anxiety  #Benzodiazepine dependence--down titrating to one clonazepam 0.5 mg  #Fibromyalgia--duloxetine as above. 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  #Statin intolerance      FOLLOW-UP     RTC     Future Appointments   Date Time Provider Department Center   12/30/2019  9:20 AM York Ram., MD GERI IMS 420 MEDICINE   01/04/2020  3:00 PM Rinaldo Cloud [place], [place] ESTWS [id number] MEDICINE   02/05/2020  3:00 PM Trenda Moots., MD ESTWS [phone] MEDICINE   02/16/2020 11:30 AM [facility] NEUMUSC B200 NEUROLOGY   03/29/2020  1:15 PM Alvina Chou, AuD AUD [phone] AUDIOLOGY/SP   03/29/2020  1:45 PM Alvina Chou, AuD AUD [phone] AUDIOLOGY/SP   05/03/2020  1:30 PM Shoku, Drue Flirt, OD DS VIS REHAB OPH         The above plan of care, diagnosis, orders, and follow-up were discussed with the patient and/or surrogate. Questions related to this recommended plan of care were answered.    ANALYSIS OF DATA (Needs to meet 1 category for moderate and 2 categories for high LOS)     I have:     Category 1 (Needs 3 for moderate and high LOS)     []  Reviewed []  1 []  2 []  ? 3 unique laboratory, radiology, and/or diagnostic tests noted below    Test/Study:  on date .    []  Reviewed []  1 []  2 []  ? 3 prior external notes and incorporated into patient assessment    I reviewed Dr. 's note in specialty  from date .    []  Discussed  management or test interpretation with external provider(s) as noted      []  Ordered []  1 []  2 []  ? 3 unique laboratory, radiology, and/or diagnostic tests noted in A&P    []  Obtained history from independent historian:       Category 2  []  Independently interpreted the test    Category 3  []  Discussed management or test interpretation with external provider(s) as noted      INTERACTION COMPLEXITY and SOCIAL DETERMINANTS of HEALTH       PROBLEM COMPLEXITY   []  New problem with uncertain diagnosis or prognosis (moderate)   []  Multiple stable chronic problems (moderate)   []  Chronic problem not stable - not controlled, symptomatic, or worsening (moderate)   []  Severe exacerbation of chronic problem (high)   []  New or chronic problem that poses threat to life or bodily function (high)     MANAGEMENT COMPLEXITY   []  Old or external/outside records reviewed   []  Discussion with alternate (proxy) if patient with impaired communication / comprehension ability (e.g., dementia, aphasia, severe hearing loss).   []  Repeated questions (or disagreement) between patient and/among caregivers/family during the visit.  []  Caregiver/patient emotions/behavior/beliefs interfering with implementation of treatment plan.  []  Independent interpretation of test (EKG, Chest XRay)   []  Discussion of case with a consultant physician     RISK LEVEL   []  Prescription drug management (moderate)   []  Minor surgery with CV risk factors or elective major surgery (moderate)   []  Dx or Rx significantly limited by SDoH (inadequate housing, living alone, poor health care access, inappropriate diet; low literacy) (moderate)   []  Major surgery - elective with CV risk factors or emergent (high)   []  Need for hospitalization (high)   []  New DNR or de-escalation of care (high)      SDoH  The diagnosis or treatment of said conditions is significantly limited by the following social determinants of health:  []  Z59.0 Homelessness  []  Z59.1 Inadequate housing  []  Z59.2 Discord with neighbors, lodgers and landlord  []  Z60.2 Problems related to living alone  []  Z59.8 Other problems related to housing and economic circumstances  []  Z59.4 Lack of adequate food and safe drinking water  []  Z59.6 Low income  []  Z59.7 Insufficient social insurance and welfare support  []  Z59.9 Problems related to housing and economic circumstances, unspecified  []  Z75.3 Unavailability and inaccessibility of health care facilities  []  Z75.4 Unavailability and inaccessibility of other helping agencies  []  Z72.4 Inappropriate diet and eating habits  []  Z62.820 Parent-biological child conflict  []  Z63.8 Other specified problems related to primary support group  []  Z55.0 Illiteracy and low level literacy   []  Z56.9 Unspecified problems related to employment        If Billing Based on Time:     I performed the following items on the day of service:    [x]  Preparing to see the patient (e.g., review of tests)  [x]  Obtaining and/or reviewing separately obtained history   [x]  Performing a medically appropriate examination and/or evaluation   [x]  Counseling and educating the patient/family/caregiver   [x]  Ordering medications, tests, or procedures  [x]  Referring and communicating with other healthcare professionals (when not separately reported)  [x]  Documenting clinical information in the EHR  []  Independently interpreting results and communicating results to patient/family/caregiver    I spent the following total amount of time on these tasks on the day of service:  New Patient     Established Patient  []   15-29 minutes ??? [id number]    []  up to 9 minutes ??? [id number]  []  30-44 minutes ??? [id number]     []  10-19 minutes ??? [id number]   []  45-59 minutes ??? [id number]      []  20-29 minutes ??? [id number]   []  60-74 minutes ??? [id number]   []  30-39 minutes ??? [id number]         [x]  40-55 minutes ??? [id number]    []  I spent an additional ** * 15-minute-increment(s) for a total of * ** minutes on these tasks on the day of service. [phone] for each additional 15 minutes.)    Author: York Ram, MD 12/30/2019

## 2020-01-01 ENCOUNTER — Ambulatory Visit: Payer: BLUE CROSS/BLUE SHIELD

## 2020-01-04 ENCOUNTER — Ambulatory Visit: Payer: BLUE CROSS/BLUE SHIELD

## 2020-01-05 DIAGNOSIS — S161XXS Strain of muscle, fascia and tendon at neck level, sequela: Secondary | ICD-10-CM

## 2020-01-05 DIAGNOSIS — M47816 Spondylosis without myelopathy or radiculopathy, lumbar region: Secondary | ICD-10-CM

## 2020-01-05 DIAGNOSIS — G7000 Myasthenia gravis without (acute) exacerbation: Secondary | ICD-10-CM

## 2020-01-05 DIAGNOSIS — M542 Cervicalgia: Secondary | ICD-10-CM

## 2020-01-05 DIAGNOSIS — G8929 Other chronic pain: Secondary | ICD-10-CM

## 2020-01-05 NOTE — Progress Notes
[facility] Progress Note     PATIENT: [name]  MRN: [id number]  DOB: 1935/12/25  DATE OF SERVICE: 01/04/2020  REFERRING PRACTITIONER: No ref. provider found  PRIMARY CARE PROVIDER: York Ram., MD    Chief Complaint   Patient presents with   ??? Neck Pain   ??? Sinusitis     Subjective:      History of Present Illness:  [name] is a 84 y.o. female with PMH of  has no past medical history on file. who presents for follow-up cervical and lumbar spondylosis.     Initial Consult 04/16/19  # Chronic neck and shoulder pain  - present for many years, gradual onset, no inciting event or trauma. Underlying myasthenia gravis.   - Prior work-up: MR cervical spine with multilevel degenerative disc disease in setting of C4-C7 congenital cervical spinal canal stenosis, neural foraminal narrowing, facet joint degeneration   - Interventions: no medications for pain; was going to start PT but didn't go due to pandemic  - Self-care: cervical collar sometimes if needed to bend down  - Timing/Duration: constant, associated with activities   - Location/Radiation: bilateral neck, no radiation  - Quality: aching, sharp at times  - Severity: varies between 5 to 10 out of 10  - Modifiers: worse with activity. better with rest.   - Associated s/s: diffuse generalized weakness of bilateral lower extremities (thinks its related to myasthenia gravis), leg pain  - Denies: numbness, tingling, upper extremity weakness, changes in bowel or bladder, changes in gait  ???  # Myasthenia gravis  - hospitalized 11/2018 for exacerbation, receiving IVIG infusions regularly  - uses a walker / cane sometimes  ???  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 Husband died 2 years ago of CHF.   BM: intermittent constipation, diarrhea   Temperature: tends to run cold  Supplements: none  __________________________________________________    Follow-up 05/21/19  - feels the treatments last visit did help reduce her pain temporarily   - still with persistent neck pain and left knee weakness  - got neck and shoulder massager and using it at home  - purchased TENS unit but forgot to bring it today  - [place]    Follow-up 06/12/19  - treatments help temporarily with the neck pain  - using shiatsu neck massager  - has TENS unit, wondering where to place     Follow-up 07/03/19  - neck pain and left knee pain persists   - overall doesn't feel well, wondering if stopping her mestinon contributed, had upcoming neurology appointment  - brought TENS unit in today, was able to get it to work, but wondered where she should put the pads    Follow-up 07/17/19  - neck pain persistent, but treatments help temporarily  - saw neuromuscular, ordered sleep study, had IVIG yesterday with improvement of symptoms   - using TENS unit for neck and heating pad, with some help    Follow-up 07/31/19  - neck and shoulders doing ok  - left lateral thigh with increased myofascial pain, as well as left knee has been bothering her more    Follow-up 08/21/19  - IVIG helping, felt the best in a while this  past Thursday   - jaw tightness acting up, biting mouth, has TMJ syndrome   - left IT band still with pain, neck and shoulder doing better, treatments helping     Follow-up 09/18/19   - neck a lot better, but still having jaw clenching and grinding  - left knee bothering her   - dog fell off bed and needed surgery     Follow-up 10/02/19  - treatments temporarily helping  - still with bilateral neck, bilateral jaw, and left more than right knee pain today   - she has been watching the Olympics  - also staying in as pandemic with more cases     Follow-up 10/22/19  - treatments overall helping, more with her neck pain  - left knee more bothersome today   - sleep study ordered by neuromuscular to evaluate for OSA and restless leg syndrome  - started taking American Ginseng tea, enjoys drinking it     Follow-up 11/16/19  - acupuncture helps her feel relaxed, injections help with neck pain temporarily   - right neck with more tension today  - using TENS machine  - tried neck massager but it is too hard / rough   - enjoying American ginseng tea    Follow-up 12/10/19  Patient overall stable. Neck symptoms wax and wane; treatments do help. Patient using TENS unit intermittently with some help.   Follow up 01/04/2020  Patient has good days and some not so good. Using TENS but needs more extensive and regular use for both the pain and even for her MG.Watching posture!  Pain Location: Neck,Lower,Back  Pain: Stable  Sleep: Stable  Acupuncture Comments: Pt w/ hx of cervical /lumbar spondylosis and myasthenia gravis presents neck pain. Relief after tx but pain returns. Sinus bothersome. Poor sleep. Acupuncture w/o Estim  Medications that the patient states to be currently taking   Medication Sig   ??? Albuterol Sulfate AEPB Takes 1 to 2 puffs every 4 hours as needed for wheezing or SOB Inhale Takes 1 to 2 puffs every 4 hours as needed for wheezing or SOB .Marland Kitchen   ??? azaTHIOprine 50 mg tablet Take one and one-half tablet (75 mg total) by mouth two (2) times daily.   ??? buPROPion, SR, (WELLBUTRIN SR) 200 mg 12 hr tablet Take 1 tablet (200 mg total) by mouth two (2) times daily.   ??? clonazePAM 0.5 mg tablet Take 1 tablet (0.5 mg total) by mouth daily. Max Daily Amount: 0.5 mg   ??? dexlansoprazole 60 mg DR capsule Take 1 capsule (60 mg total) by mouth daily.   ??? DULoxetine 20 mg DR capsule Take 2 capsules (40 mg total) by mouth daily.   ??? fluticasone 50 mcg/act nasal spray Spray 1 spray by nasal route daily.   ??? fluticasone-vilanterol (BREO ELLIPTA) 100-25 mcg/inh inhaler Inhale 1 puff daily.   ??? GAMMAGARD 30 GM/300ML SOLN every twenty one (21) days .   ??? lidocaine 5% patch Place 1 patch onto the skin every twelve (12) hours 12 hours on 12 hours off.Marland Kitchen   ??? loratadine 10 mg tablet Take 1 tablet (10 mg total) by mouth daily as needed for Allergies.   ??? losartan 25 mg tablet Take 1 tablet (25 mg total) by mouth daily.   ??? montelukast 10 mg tablet Take 10 mg by mouth as needed for.   ??? NIFEdipine (PROCARDIA XL) 60 mg 24 hr tablet Take 1 tablet (60 mg total) by mouth daily.   ??? trimethoprim 100  mg tablet Take 1 tablet (100 mg total) by mouth daily.   No past medical history on file.    Objective:   BP 176/70 Comment: per pt she was rushing ~ Pulse (!) 108  ~ Temp 36.1 ???C (96.9 ???F) (Forehead)     Physical Exam:  General: alert, appears stated age and cooperative  Eyes: conjunctiva and lids normal  ENMT: normal external nose and ears.  normal oropharynx  Lungs:normal respiratory effort.  no cyanosis.  Extremities:extremities normal, atraumatic, no cyanosis or edema  Skin: warm and dry, no rashes.  Psych: alert and oriented to person, place and time  MSK: tenderness of below muscle groups     Trigger/ Tender Points Identification:       Assessment and Plan   Lavonne Kinderman is a 84 y.o. y.o. female who presents with   1. Cervical myofascial strain, sequela  Acupuncture    Inject trigger point, 1 or 2   2. Anxiety  Acupuncture   3. Chronic neck pain  Acupuncture    Inject trigger point, 1 or 2   4. Myasthenia gravis without (acute) exacerbation (HCC/RAF)  Acupuncture   5. Spondylosis without myelopathy or radiculopathy, lumbar region       # Chronic neck pain  # Cervical myofascial pain  Temporary improvement with treatments, overall stable but persistent. Patient with chronic neck pain for years, underlying myasthenia gravis, and MRI with multilevel degenerative changes and neural foraminal narrowing. Exam reveals multiple trigger points suggesting significant myofascial component to pain.   - repeat acupuncture today  - repeat trigger point injections today   - continue TENS unit use, use on St-36  - continue neck and heat shiatsu massage device   - gentle neck stretching. Discussed community massage.     # Chronic left knee pain  - acupuncture done today     # TMJ syndrome  - treat neck as above   ???  # Myasthenia gravis   - Qi tonic foods   - continue American ginseng tea, 1-2 times per day  - follow-up with neurology, receiving IVIG   -patient to use TENS on St 36  Treatments Provided Today:     Acupuncture point interventions:  Acupuncture Points:  (K3, 7, lu8, lv8, li4, 11, pc6, gb34,sp10 du20) Sp-6/St-36               Bitong,Yintang     Acupuncture Treatment Duration: 20 Minutes  Injection of : Trigger Points with 0.56mL 1% polocaine      Splenius cervicis,Trapezius Infraspinatus                Trigger Point Injection Procedure Note   Procedure: Trigger point injection of Splenius cervicis,Trapezius Infraspinatus              Indication: Myofascial pain; pain or altered sensation in the expected distribution of referred pain from a trigger point; taut band palpable in an accessible muscle with exquisite tenderness at one point along the length of it; some degree of restricted motion when measurable; reproduction of pain by stimulating trigger point  Consent: The risks (infection, bleeding, pain) and benefits of trigger points injections were explained to the patient and alternatives were discussed. All questions were answered to the patient's satisfaction. The patient verbalized understanding and consented to the trigger point injection(s).  Sterilization: The target area of the active trigger point(s) was sterilized using a 70% isopropyl alcohol pad.   Procedure: Manual palpation was used to identify the target area.  Using a 1-inch 23 gauge and/or 0.5-inch 25 gauge needle, the following procedure was completed:   Injection of : Trigger Points with 0.1mL 1% polocaine   into Splenius cervicis,Trapezius Infraspinatus              The needle was infiltrated 1-3 cm in a fanwise method into each trigger point location. Manual pressure was applied after the procedure. There was no significant blood loss. The patient tolerated the procedure well.    Follow-up     Return in about 2 weeks (around 01/18/2020).    Future Appointments   Date Time Provider Department Center   02/05/2020  3:00 PM Trenda Moots., MD ESTWS [id number] MEDICINE   02/10/2020 10:40 AM York Ram., MD GERI IMS 420 MEDICINE   02/16/2020 11:30 AM [facility] NEUMUSC B200 NEUROLOGY   02/29/2020  3:00 PM Trenda Moots., MD ESTWS [phone] MEDICINE   03/29/2020  1:15 PM Alvina Chou, AuD AUD [phone] AUDIOLOGY/SP   03/29/2020  1:45 PM Alvina Chou, AuD AUD [phone] AUDIOLOGY/SP   05/03/2020  1:30 PM Sherilyn Banker, OD DS VIS REHAB OPH     Seen and discussed with Dr. Karie Mainland    Author: Will Bonnet, MD, 01/05/2020, 1:55 PM       Chrys Racer M.D. F.A.C.P.  Professor and Investment banker, operational for [facility] Medicine  Department of Medicine  [employer]

## 2020-01-11 ENCOUNTER — Telehealth: Payer: BLUE CROSS/BLUE SHIELD

## 2020-01-11 DIAGNOSIS — N1831 Stage 3a chronic kidney disease (HCC/RAF): Secondary | ICD-10-CM

## 2020-01-11 MED ORDER — LOSARTAN POTASSIUM 25 MG PO TABS
25 mg | ORAL_TABLET | Freq: Every day | ORAL | 3 refills | Status: AC
Start: 2020-01-11 — End: 2020-01-12

## 2020-01-11 MED ORDER — LOSARTAN POTASSIUM 25 MG PO TABS
25 mg | ORAL_TABLET | Freq: Every day | ORAL | 3 refills | Status: AC
Start: 2020-01-11 — End: ?

## 2020-01-11 NOTE — Telephone Encounter
Forwarded by: Mialani Reicks Valencia Georgene Kopper

## 2020-01-11 NOTE — Addendum Note
Addended by: Zannie Kehr on: 01/11/2020 03:15 PM     Modules accepted: Orders

## 2020-01-11 NOTE — Telephone Encounter
Reply by: [name]        Good afternoon Dr. Mindi Slicker,    Patient is requesting that [name] be removed from her chart. She said refill should go to [facility].       [name]

## 2020-01-11 NOTE — Telephone Encounter
Outpatient Geriatric Telephone Visit     Patient: [name]  MRN: [id number]  PCP: York Ram., MD  Date: 01/11/2020      Patient's concerns:  BP up  DIdn't start nasal spray  Wondering aobut getting in to see psychology      Assessment and Plan:    Problems:      Plan:      ???  #HTN--Increase nifedipine CR 60 mg daily. Was not on losartan 25 mg.  Start this and recheck BP and BMP in one week  #Aortic Calcification--noted on chest imaging     September 22, 2019???       --Not Worsening.  Chronic.  Will monitor and treat underlying risk factors with medical and lifestyle interventions as warranted by balance between benefits and burdens.  #allergic rhinitis--trial flonase  #Macrocytosis--check B12, folic acid today  #Deconditioning--initiate home PT  #GERD--s/p Nisan fundoplication  #COPD--mixed--per PFT's from [name].--consider reinitiation of inhalers.  #s/p rectocele repair  #s/p cholecystectomy  #Nocturnal hypoxemia--presumably related to MG crisis but has history of COPD. ???Will order nocturnal home O2 study and consider PFT's/Pulmonary evaluation once settled down. ???May need to restart BREO inhaler.  #Myasthenia Gravis--s/p plasmapheresis x5 October 2020, no Thymoma, on Azathioprine 150 daily and mestinon 60 tid.??????Saw???Dr. Enedina Finner at [facility]. ???Was considering Soliris.??????Ordering IVIG and MRI.  Encourage ongoing neurology follow up.   #IBS--titrate miralax to comfort. ???Consider duloxetine substitute for buproprion.  #Interstitial cystitis--on daily Trimethoprim 100 for UTI prophylaxis. ???Using bladder instillations prn. OFF topical estrogen due to advice from neurologist re: MG.??????To see Dr. Desma Maxim to see if she has additional insights.  #Major Depression--active but appears better s/p  increase Duloxetine to 40 mg daily. Referred patient to Dr. Randie Heinz previously. Encouraged her following up with him.  #Allergic rhinitis--previously on nasal steroids. Restart.  If not better to see ENT  #Breast CA--DCIS???by report--obtain mammo. ???Patient declined surgery in 2019 and experienced dry mouth from Tamoxifen. ???Was considering Anastrozole.???Await records from prior mammogram and treatment.  Per records from [facility]:   ''07/20/2017 Screening detected right breast calcifications and distortion. The distortion was in the upper outer quadrant: Biopsy DCIS intermediate grade with CSL; calcifications UIQ: 2.8 cm: Biopsy DCIS+ ALH +CSL; 4 cm apart, axilla negative, Tis NX stage 0''  Due for repeat mammo Apirl 2022  #DM with CKD--stabe 3b--based on labs from [name]. ???FOllow here. ???If BP permits, may consider ARB BUT patient with h/o angioedema so will ONLY initiate this agent with great caution.??????Refer to Ophthalmology.  #OA knees--previously getting hyaluronic acid injections.  #Anxiety  #Benzodiazepine dependence--down titrating to one clonazepam 0.5 mg  #Fibromyalgia--duloxetine as above. 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  #Statin intolerance      # advice given about precautions for coronavirus    Future Appointments   Date Time Provider Department Center   02/05/2020  3:00 PM Trenda Moots., MD ESTWS [id number] MEDICINE   02/10/2020 10:40 AM York Ram., MD GERI IMS 420 MEDICINE   02/16/2020 11:30 AM [facility]U [facility] NEUMUSC B200 NEUROLOGY   02/29/2020  3:00 PM Trenda Moots., MD ESTWS [id number] MEDICINE   03/29/2020  1:15 PM Alvina Chou, AuD AUD [phone] AUDIOLOGY/SP   03/29/2020  1:45 PM Alvina Chou, AuD AUD [phone] AUDIOLOGY/SP   05/03/2020  1:30 PM Shoku, Bita, OD DS VIS REHAB OPH         Total time spent on this telephone encounter:  []   5-10  minutes (99441)  []   11-20 minutes (10272)  [x]   >21 minutes (53664)      [All the following must be checked off in order to bill for this service.]    [x]   This service is not related to a problem addressed by an E/M encounter in the prior 7 days  [x]   This service will not result in an office or telemedicine visit within the next 24 hours or soonest available appointment.   [x]   This service was initiated by the patient.   [x]   This patient is an established patient in my practice.     The patient has been notified that this visit will result in an E/M charge.    Signed: York Ram, MD

## 2020-01-11 NOTE — Telephone Encounter
Hi [name]  There is some confusion.  The patient told me today that she hadn't been getting it for some time.  I refilled it.    Is she getting it?  If so, what is the dose?  She asked me to send it to [facility].

## 2020-01-11 NOTE — Telephone Encounter
Call Back Request      Reason for call back: Delia from [facility] called in reference to the cancellation for prescription of Losartan, would like to know if this is discontinued. Please advise.     Any Symptoms:  []  Yes  [x]  No       If yes, what symptoms are you experiencing:    o Duration of symptoms (how long):    o Have you taken medication for symptoms (OTC or Rx):      Patient or caller has been notified of the 24-48 hour turnaround time.

## 2020-01-12 NOTE — Telephone Encounter
[name] w/[facility] called to f/u on medication cancellation. I spoke w/ [place] who confirmed that patient will now utilize the services of [facility], and the request for Losartan 25mg  can be cancelled at [facility]. I relayed the message to [name] at [facility], and she verbalized understanding.

## 2020-01-19 NOTE — Telephone Encounter
Provider line - I received a call from [facility] calling in regards to IVIG infusion. The patient had high blood pressure that caused the last infusion to be cut short. They were looking to get into contact with the coordinator for Dr. Jefm Bryant to discuss this matter. I spoke with coordinator Imagene Gurney who requested the call to be transferred. I transferred the call.

## 2020-01-19 NOTE — Telephone Encounter
Spoke with Arlena who informs me patient had a spike in her blood pressure: 175/84, patient was only infused with 10 grams out of 60 grams and was advised by Dr. Jefm Bryant to see PCP for BP medication, pt saw PCP and was prescribed Losartan 25mg . Arlena informs me I can provide a verbal and she also sent the prescription by fax and also provided to [place] to email to me. I informed Arlena that I want to speak with Dr. Jefm Bryant before providing a verbal and get his advise. Will call back tomorrow once spoken with Dr. Jefm Bryant.     CB# [phone].

## 2020-01-29 ENCOUNTER — Ambulatory Visit: Payer: BLUE CROSS/BLUE SHIELD

## 2020-01-29 ENCOUNTER — Telehealth: Payer: BLUE CROSS/BLUE SHIELD

## 2020-01-30 NOTE — Telephone Encounter
Message to Practice/Provider      Message: Justin Nurse [phone]. Called because he is going to administer the IVIG Infusion and pt had Blood Pressure of 150/77 at 6:00pm tonight, Friday.  He checked with the pharmacy and they did give him ok to still administer, but at a slower rate.  Pt is stable. He just wanted you to know.  Thank you.    Return call is not being requested by the patient or caller.    Patient or caller has been notified of the 24-48 hour processing turnaround time if applicable.

## 2020-02-03 ENCOUNTER — Ambulatory Visit: Payer: BLUE CROSS/BLUE SHIELD

## 2020-02-03 ENCOUNTER — Ambulatory Visit: Payer: Commercial Managed Care - Pharmacy Benefit Manager

## 2020-02-05 ENCOUNTER — Ambulatory Visit: Payer: BLUE CROSS/BLUE SHIELD

## 2020-02-05 DIAGNOSIS — M5382 Other specified dorsopathies, cervical region: Secondary | ICD-10-CM

## 2020-02-05 DIAGNOSIS — M7918 Myalgia, other site: Secondary | ICD-10-CM

## 2020-02-05 DIAGNOSIS — G7000 Myasthenia gravis without (acute) exacerbation: Secondary | ICD-10-CM

## 2020-02-05 DIAGNOSIS — G8929 Other chronic pain: Secondary | ICD-10-CM

## 2020-02-05 DIAGNOSIS — M542 Cervicalgia: Secondary | ICD-10-CM

## 2020-02-05 NOTE — Progress Notes
[facility] Progress Note     PATIENT: [name]  MRN: [id number]  DOB: 02-22-1936  DATE OF SERVICE: 02/05/2020  REFERRING PRACTITIONER: No ref. provider found  PRIMARY CARE PROVIDER: York Ram., MD    Chief Complaint   Patient presents with   ??? Leg Pain   ??? nerve pain   ??? Muscle Pain     Subjective:      History of Present Illness:  [name] is a 84 y.o. female with PMH of  has no past medical history on file. who presents for follow-up cervical and lumbar spondylosis.     Initial Consult 04/16/19  # Chronic neck and shoulder pain  - present for many years, gradual onset, no inciting event or trauma. Underlying myasthenia gravis.   - Prior work-up: MR cervical spine with multilevel degenerative disc disease in setting of C4-C7 congenital cervical spinal canal stenosis, neural foraminal narrowing, facet joint degeneration   - Interventions: no medications for pain; was going to start PT but didn't go due to pandemic  - Self-care: cervical collar sometimes if needed to bend down  - Timing/Duration: constant, associated with activities   - Location/Radiation: bilateral neck, no radiation  - Quality: aching, sharp at times  - Severity: varies between 5 to 10 out of 10  - Modifiers: worse with activity. better with rest.   - Associated s/s: diffuse generalized weakness of bilateral lower extremities (thinks its related to myasthenia gravis), leg pain  - Denies: numbness, tingling, upper extremity weakness, changes in bowel or bladder, changes in gait  ???  # Myasthenia gravis  - hospitalized 11/2018 for exacerbation, receiving IVIG infusions regularly  - uses a walker / cane sometimes  ???  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 Husband died 2 years ago of CHF.   BM: intermittent constipation, diarrhea   Temperature: tends to run cold  Supplements: none  __________________________________________________    Follow-up 05/21/19  - feels the treatments last visit did help reduce her pain temporarily   - still with persistent neck pain and left knee weakness  - got neck and shoulder massager and using it at home  - purchased TENS unit but forgot to bring it today  - [place]    Follow-up 06/12/19  - treatments help temporarily with the neck pain  - using shiatsu neck massager  - has TENS unit, wondering where to place     Follow-up 07/03/19  - neck pain and left knee pain persists   - overall doesn't feel well, wondering if stopping her mestinon contributed, had upcoming neurology appointment  - brought TENS unit in today, was able to get it to work, but wondered where she should put the pads    Follow-up 07/17/19  - neck pain persistent, but treatments help temporarily  - saw neuromuscular, ordered sleep study, had IVIG yesterday with improvement of symptoms   - using TENS unit for neck and heating pad, with some help    Follow-up 07/31/19  - neck and shoulders doing ok  - left lateral thigh with increased myofascial pain, as well as left knee has been bothering her more    Follow-up 08/21/19  - IVIG helping, felt the best  in a while this past Thursday   - jaw tightness acting up, biting mouth, has TMJ syndrome   - left IT band still with pain, neck and shoulder doing better, treatments helping     Follow-up 09/18/19   - neck a lot better, but still having jaw clenching and grinding  - left knee bothering her   - dog fell off bed and needed surgery     Follow-up 10/02/19  - treatments temporarily helping  - still with bilateral neck, bilateral jaw, and left more than right knee pain today   - she has been watching the Olympics  - also staying in as pandemic with more cases     Follow-up 10/22/19  - treatments overall helping, more with her neck pain  - left knee more bothersome today   - sleep study ordered by neuromuscular to evaluate for OSA and restless leg syndrome  - started taking American Ginseng tea, enjoys drinking it     Follow-up 11/16/19  - acupuncture helps her feel relaxed, injections help with neck pain temporarily   - right neck with more tension today  - using TENS machine  - tried neck massager but it is too hard / rough   - enjoying American ginseng tea    Follow-up 12/10/19  Patient overall stable. Neck symptoms wax and wane; treatments do help. Patient using TENS unit intermittently with some help.     Follow up 01/04/2020  Patient has good days and some not so good. Using TENS but needs more extensive and regular use for both the pain and even for her MG.Watching posture!    Follow-up 02/05/20  Overall stable. Neck tightness remains but treatments are helping. Continuing with IVIG infusions for her myasthenia every 2 weeks.        Medications that the patient states to be currently taking   Medication Sig   ??? Albuterol Sulfate AEPB Takes 1 to 2 puffs every 4 hours as needed for wheezing or SOB Inhale Takes 1 to 2 puffs every 4 hours as needed for wheezing or SOB .Marland Kitchen   ??? azaTHIOprine 50 mg tablet Take one and one-half tablet (75 mg total) by mouth two (2) times daily.   ??? buPROPion, SR, (WELLBUTRIN SR) 200 mg 12 hr tablet Take 1 tablet (200 mg total) by mouth two (2) times daily.   ??? clonazePAM 0.5 mg tablet Take 1 tablet (0.5 mg total) by mouth daily. Max Daily Amount: 0.5 mg   ??? dexlansoprazole 60 mg DR capsule Take 1 capsule (60 mg total) by mouth daily.   ??? DULoxetine 20 mg DR capsule Take 2 capsules (40 mg total) by mouth daily.   ??? fluocinonide 0.05% external solution    ??? fluticasone 50 mcg/act nasal spray Spray 1 spray by nasal route daily.   ??? fluticasone-vilanterol (BREO ELLIPTA) 100-25 mcg/inh inhaler Inhale 1 puff daily.   ??? GAMMAGARD 30 GM/300ML SOLN every twenty one (21) days . ??? lidocaine 5% patch Place 1 patch onto the skin every twelve (12) hours 12 hours on 12 hours off.Marland Kitchen   ??? loratadine 10 mg tablet Take 1 tablet (10 mg total) by mouth daily as needed for Allergies.   ??? losartan 25 mg tablet Take 1 tablet (25 mg total) by mouth daily.   ??? montelukast 10 mg tablet Take 10 mg by mouth as needed for.   ??? trimethoprim 100 mg tablet Take 1 tablet (100 mg total) by mouth daily.   No past medical  history on file.    Objective:   BP 153/63 (BP Location: Left arm)  ~ Pulse 94  ~ Temp 36.2 ???C (97.1 ???F) (Forehead)     Physical Exam:  General: alert, appears stated age and cooperative  Eyes: conjunctiva and lids normal  ENMT: normal external nose and ears.  normal oropharynx  Lungs:normal respiratory effort.  no cyanosis.  Extremities:extremities normal, atraumatic, no cyanosis or edema  Skin: warm and dry, no rashes.  Psych: alert and oriented to person, place and time  MSK: tenderness of below muscle groups     Trigger/ Tender Points Identification:     Assessment and Plan   Elodia Haviland is a 84 y.o. y.o. female who presents with   1. Chronic neck pain  Acupuncture    INJECT TRIGGER POINTS, > 3   2. Other specified dorsopathies, cervical region  Acupuncture    INJECT TRIGGER POINTS, > 3   3. Myofascial pain  Acupuncture    INJECT TRIGGER POINTS, > 3   4. Fibromyalgia  Acupuncture    INJECT TRIGGER POINTS, > 3   5. Myasthenia gravis without (acute) exacerbation (HCC/RAF)       # Chronic neck pain  # Cervical myofascial pain  Temporary improvement with treatments, overall stable but persistent. Patient with chronic neck pain for years, underlying myasthenia gravis, and MRI with multilevel degenerative changes and neural foraminal narrowing. Exam reveals multiple trigger points suggesting significant myofascial component to pain.   - repeat acupuncture today  - repeat trigger point injections today   - continue TENS unit use, use on St[address]36  - continue neck and heat shiatsu massage device   - gentle neck stretching. Discussed community massage.     # Chronic left knee pain  - acupuncture done today     # TMJ syndrome  - treat neck as above   ???  # Myasthenia gravis   - Qi tonic foods   - continue American ginseng tea, 1-2 times per day  - follow-up with neurology, receiving IVIG   -patient to use TENS on [address]  Treatments Provided Today:     Acupuncture point interventions:    LI-4/Lv-3,Sp-6/St-36   Sp-10   K-3             Acupuncture Treatment Duration: 20 Minutes  Injection of : Tendon Sheath with 0.7mL 1% lidocaine      Trapezius Levator scapulae,Rhomboids                Trigger Point Injection Procedure Note   Procedure: Trigger point injection of Trapezius Levator scapulae,Rhomboids              Indication: Myofascial pain; pain or altered sensation in the expected distribution of referred pain from a trigger point; taut band palpable in an accessible muscle with exquisite tenderness at one point along the length of it; some degree of restricted motion when measurable; reproduction of pain by stimulating trigger point  Consent: The risks (infection, bleeding, pain) and benefits of trigger points injections were explained to the patient and alternatives were discussed. All questions were answered to the patient's satisfaction. The patient verbalized understanding and consented to the trigger point injection(s).  Sterilization: The target area of the active trigger point(s) was sterilized using a 70% isopropyl alcohol pad.   Procedure: Manual palpation was used to identify the target area. Using a 1-inch 23 gauge and/or 0.5-inch 25 gauge needle, the following procedure was completed:   Injection of : Tendon  Sheath with 0.57mL 1% lidocaine   into Trapezius Levator scapulae,Rhomboids              The needle was infiltrated 1-3 cm in a fanwise method into each trigger point location. Manual pressure was applied after the procedure. There was no significant blood loss. The patient tolerated the procedure well.    Follow-up     Return in about 4 weeks (around 03/04/2020) for Blake Divine / acupuncturist with K Hui or A Chu.    Future Appointments   Date Time Provider Department Center   02/10/2020 10:40 AM York Ram., MD GERI IMS 420 MEDICINE   02/16/2020 11:30 AM [facility] B200 NEUROLOGY   02/29/2020  3:00 PM Trenda Moots., MD [facility] [phone] MEDICINE   03/21/2020  1:00 PM Trenda Moots., MD [facility] [id number] MEDICINE   03/29/2020  1:15 PM Alvina Chou, AuD AUD [id number] AUDIOLOGY/SP   03/29/2020  1:45 PM Alvina Chou, AuD AUD [id number] AUDIOLOGY/SP   05/03/2020  1:30 PM Sherilyn Banker, OD DS [facility] OPH     Seen and discussed with Dr. Norwood Levo    Author: Will Bonnet, MD, 02/05/2020, 4:20 PM

## 2020-02-09 DIAGNOSIS — Z0000 Encounter for general adult medical examination without abnormal findings: Secondary | ICD-10-CM

## 2020-02-09 NOTE — Progress Notes
OUTPATIENT GERIATRICS CLINIC NOTE    PATIENT:  [name]   MRN:  [id number]  DOB:  11-12-1935  DATE OF SERVICE:  02/10/2020  PRIMARY CARE PHYSICIAN: York Ram., MD    CHIEF COMPLAINT:   No chief complaint on file.      [facility]:  [facility]    [facility]:    Chaperone status:  No data recorded    HISTORY OF PRESENT ILLNESS     [name] is a 84 y.o. female who presents today for *follow up**. Patient is accompanied by *no one**. History today is per the patient,and review of available recent records in [facility] and Care Everywhere.     Patient is a(n) [x]  reliable []  unreliable historian and additional collateral information was obtained during the visit today from:     Ran out of duloxetine.  Pharmacy did not dispense 180 pills.      Per chart review, pertinent medical history:  No past medical history on file.  No past surgical history on file.    ALLERGIES     Allergies   Allergen Reactions   ??? Beta Adrenergic Blockers Other (See Comments)     Avoid d/t Myasthenia Gravis  Avoid d/t Myasthenia Gravis     ??? Levofloxacin Other (See Comments)     Flare of Myasthenia Gravis   ??? Macrolides And Ketolides Other (See Comments)     Avoid d/t Myasthenia Gravis  Use with caution d/t Myasthenia Gravis  Use with caution d/t Myasthenia Gravis     ??? Sulfa Antibiotics Other (See Comments)     May possibly have caused deafness in one ear  May possibly have caused deafness in one ear  other     ??? Botulinum Toxins Other (See Comments)     Muscle weakness r/t Myasthenia Gravis  Muscle weakness r/t Myasthenia Gravis     ??? Statins Other (See Comments)     Muscle aches  Muscle aches  Muscle aches     ??? Plastic Tape Itching and Other (See Comments)     Turns the skin red where applied        MEDICATIONS     Personally reviewed.    Medications that the patient states to be currently taking   Medication Sig   ??? Albuterol Sulfate AEPB Takes 1 to 2 puffs every 4 hours as needed for wheezing or SOB Inhale Takes 1 to 2 puffs every 4 hours as needed for wheezing or SOB .Marland Kitchen   ??? azaTHIOprine 50 mg tablet Take one and one-half tablet (75 mg total) by mouth two (2) times daily.   ??? buPROPion, SR, (WELLBUTRIN SR) 200 mg 12 hr tablet Take 1 tablet (200 mg total) by mouth two (2) times daily.   ??? clonazePAM 0.5 mg tablet Take 1 tablet (0.5 mg total) by mouth daily. Max Daily Amount: 0.5 mg   ??? dexlansoprazole 60 mg DR capsule Take 1 capsule (60 mg total) by mouth daily.   ??? DULoxetine 20 mg DR capsule Take 2 capsules (40 mg total) by mouth daily.   ??? fluocinonide 0.05% external solution    ??? fluticasone 50 mcg/act nasal spray Spray 1 spray by nasal route daily.   ??? fluticasone-vilanterol (BREO ELLIPTA) 100-25 mcg/inh inhaler Inhale 1 puff daily.   ??? GAMMAGARD 30 GM/300ML SOLN every twenty one (21) days .   ??? losartan 25 mg tablet Take 1 tablet (25 mg total) by mouth daily.   ??? montelukast 10  mg tablet Take 10 mg by mouth as needed for.   ??? trimethoprim 100 mg tablet Take 1 tablet (100 mg total) by mouth daily.   ??? [DISCONTINUED] clonazePAM 0.5 mg tablet Take 1 tablet (0.5 mg total) by mouth daily. Max Daily Amount: 0.5 mg   ??? [DISCONTINUED] dexlansoprazole 60 mg DR capsule Take 1 capsule (60 mg total) by mouth daily.   ??? [DISCONTINUED] DULoxetine 20 mg DR capsule Take 2 capsules (40 mg total) by mouth daily.       SOCIAL HISTORY     Social History     Social History Narrative   ??? Not on file        FUNCTIONAL STATUS     BADLs:  IADLs:    Ambulates with   Falls in past year:  Afraid of falling:    GERIATRIC REVIEW OF SYSTEMS     Vision:    []   glasses     []  legally blind  Hearing: []  hearing aids    Nutrition: []  normal   []  impaired  []  vegan  []  vegetarian  []  low salt  []  low-carb   Swallowing: []  impaired   Dentures:   []  yes    Depression:  []  yes   Cognition:     Incontinence: [] urine  []  fecal  []  urine and fecal      ADVANCED CARE PLANNING     Advanced directives on file: []  No  []   Yes ??? Completed:     Medical DPOA on file:   []   Yes ???   []   No ??? Patient designates ** * to be their surrogate medical decision-maker.         HEALTH CARE MAINTENANCE     IMMUNIZATIONS:   Immunization History   Administered Date(s) Administered   ??? COVID-19, mRNA, (Pfizer) 30 mcg/0.3 mL 03/11/2019, 04/01/2019, 10/29/2019   ??? DTaP 12/31/2015   ??? influenza vaccine IM quadrivalent (Fluzone Quad) MDV (68 months of age and older) 11/26/2016   ??? influenza vaccine IM quadrivalent adjuvanted (FluAD Quad) (PF) SYR (65 years of age and older) 11/05/2018, 12/01/2019   ??? influenza, unspecified formulation 04/01/2019, 04/01/2019, 12/01/2019, 12/01/2019, 12/01/2019   ??? pneumococcal polysaccharide vaccine 23-valent (Pneumovax) 01/10/2019       Mammogram:  DXA:  PAP:  Colonoscopy:      PHYSICAL EXAM     BP 138/63 (BP Location: Left arm, Patient Position: Sitting, Cuff Size: Large)  ~ Pulse (!) 101  ~ Temp 36.1 ???C (96.9 ???F) (Temporal)  ~ Resp 18  ~ Ht 5' 5'' (1.651 m)  ~ Wt 148 lb (67.1 kg)  ~ SpO2 95%  ~ BMI 24.63 kg/m???   Wt Readings from Last 3 Encounters:   02/10/20 148 lb (67.1 kg)   12/30/19 (P) 145 lb (65.8 kg)   11/25/19 145 lb (65.8 kg)         System Check if normal Positive or additional negative findings   GEN  [x]  NAD     Eyes  []  Conj/Lids []  Pupils  []  Fundi   []  Sclerae []  EOM     ENT  []  External ears   []  Otoscopy   []  Gross Hearing    []   External nose   []  Nasal mucosa   []  Lips/teeth/gums    []  Oropharynx    []  Mucus membranes      Neck  [x]  Inspection/palpation    [x]  Thyroid     Resp  [x]  Effort    [  x] Auscultation       CV  []  Rhythm/rate   [x]  Murmurs   [x]  Edema   []  JVP non-elevated    Normal pulses:   []  Radial []  Femoral  []  Pedal     Breast  []  Inspection []  Palpation     GI  []  Bowel sounds    [x]  Nontender   [x]  No distension    []  No rebound or guarding   []  No masses   []  Liver/spleen    []  Rectal     GU  F:  []  External []  vaginal wall         []  Cervix     []  mucus        []  Uterus    []  Adnexa   M:  []  Scrotum []  Penis []  Prostate     Lymph  [x]  Cervical []  supraclavicular     [x]  Axillae   []  Groin/inguinal     MSK []  Gait  []  Back     Specify site examined:    []  Inspect/palp []  ROM   []  Stability []  Strength/tone []  Used arms to push up from seated to standing position   Assistive device:  []  single point cane []  quad cane  []  FWW []  rollator walker      Skin  []  Inspection []  Palpation     Neuro  []  Alert and oriented     []  CN2-12 intact grossly   []  DTR      []  Muscle strength      []  Sensation   []  Pronator drift   []  Finger to Nose/Heel to Shin   []  Romberg     Psych  []  Insight/judgement     []  Mood/affect    []  Gross cognition            LABS/STUDIES     LABS:  Lab Results   Component Value Date    WBC 4.71 11/25/2019    WBC 4.60 09/22/2019    WBC 4.35 07/14/2019    HGB 13.8 11/25/2019    HGB 12.3 09/22/2019    HGB 12.7 07/14/2019    MCV 100.0 (H) 11/25/2019    PLT 374 11/25/2019    PLT 342 09/22/2019    PLT 323 07/14/2019     Lab Results   Component Value Date    NA 139 11/25/2019    NA 139 09/22/2019    NA 140 04/14/2019    K 3.9 11/25/2019    K 4.1 09/22/2019    K 4.5 04/14/2019    CREAT 1.22 11/25/2019    CREAT 1.10 09/22/2019    CREAT 1.05 04/14/2019    GFRESTNOAA 42 11/25/2019    GFRESTNOAA 47 09/22/2019    GFRESTNOAA 50 04/14/2019    GFRESTAA 48 11/25/2019    GFRESTAA 54 09/22/2019    GFRESTAA 58 04/14/2019    CALCIUM 9.6 11/25/2019    CALCIUM 9.0 09/22/2019    CALCIUM 9.4 04/14/2019     Lab Results   Component Value Date    ALT 13 11/25/2019    ALT 13 09/22/2019    AST 29 11/25/2019    AST 25 09/22/2019    ALKPHOS 92 11/25/2019    BILITOT 0.3 11/25/2019    ALBUMIN 4.1 11/25/2019    ALBUMIN 3.7 (L) 09/22/2019     Lab Results   Component Value Date    TSH 1.00 12/17/2018     Lab Results   Component Value Date  HGBA1C 4.9 09/22/2019     No results found for: CHOL, CHOLDLCAL  No results found for: CHOLDLQ  No results found for: [facility]  Lab Results   Component Value Date    FE 87 07/14/2019    FERRITIN 57 07/14/2019    FOLATE 9.6 11/25/2019    TIBC 356 07/14/2019     Lab Results   Component Value Date    VITAMINB12 3,994 (H) 11/25/2019    VITAMINB12 217 (L) 09/22/2019     Lab Results   Component Value Date    BNP 92 09/22/2019     No results found for: PSATOTAL      STUDIES:    EKG  This data element was independently reviewed and interpreted by me     NSR NO ST or T changes    XR CHEST PA LAT 2V  September 22, 2019???  COMPARISON: KUB December 17, 2018  ???  History: sob  ???  FINDINGS:  ???  Lungs: Clear  Heart/aorta: Normal heart size. The thoracic aorta is mildly calcified, tortuous and ectatic.  Adenopathy: None  Pleura: No effusion  Bones and Chest wall: No acute bony or body wall  findings. Osteopenia and bony maturational changes. Right upper quadrant cholecystectomy clips stable from October 2020  ???  ???  IMPRESSION:  ???  No acute findings or abnormalities related to provided history.    ASSESSMENT and PLAN     [name] is a 84 y.o. female who presents today for *follow up**.      #Mastalgia--check diagnostic mammogram.  Exam unrevealing  #HTN--Increased nifedipine CR 60 mg daily. Continue losartan 25 mg.    #Aortic Calcification--noted on chest imaging     September 22, 2019???       --Not Worsening.  Chronic.  Will monitor and treat underlying risk factors with medical and lifestyle interventions as warranted by balance between benefits and burdens.  #allergic rhinitis--trial flonase  #Macrocytosis--  #Deconditioning--initiate home PT  #GERD--s/p Nisan fundoplication  #COPD--mixed--per PFT's from [name].--consider reinitiation of inhalers.  #s/p rectocele repair  #s/p cholecystectomy  #Nocturnal hypoxemia--presumably related to MG crisis but has history of COPD. ???Will order nocturnal home O2 study and consider PFT's/Pulmonary evaluation once settled down. ???May need to restart BREO inhaler.  #Myasthenia Gravis--s/p plasmapheresis x5 October 2020, no Thymoma, on Azathioprine 150 daily and mestinon 60 tid.??????Saw???Dr. Enedina Finner at [facility]. ???Was considering Soliris.??????Ordering IVIG and MRI.  Encourage ongoing neurology follow up.   #IBS--titrate miralax to comfort. ???Consider duloxetine substitute for buproprion.  #Interstitial cystitis--on daily Trimethoprim 100 for UTI prophylaxis. ???Using bladder instillations prn. OFF topical estrogen due to advice from neurologist re: MG.  To see Dr. Desma Maxim to see if she has additional insights.  #Major Depression--active but appears better s/p  increase Duloxetine to 40 mg daily. Referred patient to Dr. Randie Heinz previously.  #Allergic rhinitis--previously on nasal steroids.  #Breast CA--DCIS???by report--obtain mammo. ???Patient declined surgery in 2019 and experienced dry mouth from Tamoxifen. ???Was considering Anastrozole. Await records from prior mammogram and treatment.  Per records from [facility]:   ''07/20/2017 Screening detected right breast calcifications and distortion. The distortion was in the upper outer quadrant: Biopsy DCIS intermediate grade with CSL; calcifications UIQ: 2.8 cm: Biopsy DCIS+ ALH +CSL; 4 cm apart, axilla negative, Tis NX stage 0''  Due for repeat mammo Apirl 2022  #DM with CKD--stabe 3b--based on labs from [name]. ???FOllow here. ???If BP permits, may consider ARB BUT patient with h/o angioedema so will ONLY  initiate this agent with great caution.  Refer to Ophthalmology.  #OA knees--previously getting hyaluronic acid injections.  #Anxiety  #Benzodiazepine dependence--down titrating to one clonazepam 0.5 mg  #Fibromyalgia--duloxetine as above. 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  #Statin intolerance      FOLLOW-UP     RTC     Future Appointments   Date Time Provider Department Center   02/10/2020 10:40 AM York Ram., MD GERI IMS 420 MEDICINE   02/16/2020 11:30 AM [facility]U MG CLIN[facility] NEUMUSC B200 NEUROLOGY   02/29/2020  3:00 PM Trenda Moots., MD ESTWS [phone] MEDICINE   03/21/2020  1:00 PM Trenda Moots., MD ESTWS [phone] MEDICINE   03/29/2020 1:15 PM Alvina Chou, AuD AUD [phone] AUDIOLOGY/SP   03/29/2020  1:45 PM Alvina Chou, AuD AUD [phone] AUDIOLOGY/SP   05/03/2020  1:30 PM Shoku, Drue Flirt, OD DS VIS REHAB OPH         The above plan of care, diagnosis, orders, and follow-up were discussed with the patient and/or surrogate. Questions related to this recommended plan of care were answered.    ANALYSIS OF DATA (Needs to meet 1 category for moderate and 2 categories for high LOS)     I have:     Category 1 (Needs 3 for moderate and high LOS)     []  Reviewed []  1 []  2 []  ? 3 unique laboratory, radiology, and/or diagnostic tests noted below    Test/Study:  on date .    []  Reviewed []  1 []  2 []  ? 3 prior external notes and incorporated into patient assessment    I reviewed Dr. 's note in specialty  from date .    []  Discussed management or test interpretation with external provider(s) as noted      []  Ordered []  1 []  2 []  ? 3 unique laboratory, radiology, and/or diagnostic tests noted in A&P    []  Obtained history from independent historian:       Category 2  []  Independently interpreted the test    Category 3  []  Discussed management or test interpretation with external provider(s) as noted      INTERACTION COMPLEXITY and SOCIAL DETERMINANTS of HEALTH       PROBLEM COMPLEXITY   []  New problem with uncertain diagnosis or prognosis (moderate)   []  Multiple stable chronic problems (moderate)   []  Chronic problem not stable - not controlled, symptomatic, or worsening (moderate)   []  Severe exacerbation of chronic problem (high)   []  New or chronic problem that poses threat to life or bodily function (high)     MANAGEMENT COMPLEXITY   []  Old or external/outside records reviewed   []  Discussion with alternate (proxy) if patient with impaired communication / comprehension ability (e.g., dementia, aphasia, severe hearing loss).   []  Repeated questions (or disagreement) between patient and/among caregivers/family during the visit.  []  Caregiver/patient emotions/behavior/beliefs interfering with implementation of treatment plan.  []  Independent interpretation of test (EKG, Chest XRay)   []  Discussion of case with a consultant physician     RISK LEVEL   []  Prescription drug management (moderate)   []  Minor surgery with CV risk factors or elective major surgery (moderate)   []  Dx or Rx significantly limited by SDoH (inadequate housing, living alone, poor health care access, inappropriate diet; low literacy) (moderate)   []  Major surgery - elective with CV risk factors or emergent (high)   []  Need for hospitalization (high)   []  New  DNR or de-escalation of care (high)      SDoH  The diagnosis or treatment of said conditions is significantly limited by the following social determinants of health:  []  Z59.0 Homelessness  []  Z59.1 Inadequate housing  []  Z59.2 Discord with neighbors, lodgers and landlord  []  Z60.2 Problems related to living alone  []  Z59.8 Other problems related to housing and economic circumstances  []  Z59.4 Lack of adequate food and safe drinking water  []  Z59.6 Low income  []  Z59.7 Insufficient social insurance and welfare support  []  Z59.9 Problems related to housing and economic circumstances, unspecified  []  Z75.3 Unavailability and inaccessibility of health care facilities  []  Z75.4 Unavailability and inaccessibility of other helping agencies  []  Z72.4 Inappropriate diet and eating habits  []  Z62.820 Parent-biological child conflict  []  Z63.8 Other specified problems related to primary support group  []  Z55.0 Illiteracy and low level literacy   []  Z56.9 Unspecified problems related to employment        If Billing Based on Time:     I performed the following items on the day of service:    [x]  Preparing to see the patient (e.g., review of tests)  [x]  Obtaining and/or reviewing separately obtained history   [x]  Performing a medically appropriate examination and/or evaluation   [x]  Counseling and educating the patient/family/caregiver   [x]  Ordering medications, tests, or procedures  [x]  Referring and communicating with other healthcare professionals (when not separately reported)  [x]  Documenting clinical information in the EHR  []  Independently interpreting results and communicating results to patient/family/caregiver    I spent the following total amount of time on these tasks on the day of service:  New Patient     Established Patient  []  15-29 minutes ??? [id number]    []  up to 9 minutes ??? [id number]  []  30-44 minutes ??? [id number]     []  10-19 minutes ??? [id number]   []  45-59 minutes ??? [id number]      []  20-29 minutes ??? [id number]   []  60-74 minutes ??? [id number]   []  30-39 minutes ??? [id number]         [x]  40-55 minutes ??? [id number]    []  I spent an additional ** * 15-minute-increment(s) for a total of * ** minutes on these tasks on the day of service. [phone] for each additional 15 minutes.)    Author: York Ram, MD 02/10/2020        Annual Wellness Visit:     []  Initial Medicare Annual Wellness Visit   [x]  Subsequent Medicare Annual Wellness Visit - at least 12 months since last AWV     [x]  I reviewed patient's past medical history, family history and medications. I updated the electronic record.    [x]  I reviewed with patient what other consulting doctors are involved in the patient's care. See above.    Patient's Self Reported Health Status: []  poor []  fair [x]  good []  excellent  []  unreliable per patient given cognitive impairment  Hospitalizations in the past year:  []  none  []  More than 3  [x]   Less than 3  More than 5 chronic medical conditions: [x]  yes []  no   Polypharmacy (greater than 6 chronic medications):  [x]  yes []  no    Opioid assessment:  - Taking opioids: []  yes [x]  no  - If yes, address the following:  - Current pain severity:   - Evaluate current treatment plan and changes needed: []  No   []  Yes ->   -  Discussed non-opioid treatment options  - Referred to specialist: []  yes []  no    Most Recent PHQ-9 Score: (P) 7 (02/10/20 1045)    Questionnaire:    Little interest or pleasure in doing things: (P) Several days-Little interest or pleasure in doing things: (P) 1  Feeling down, depressed, or hopeless: (P) Several days-Feeling down, depressed, or hopeless: (P) 1  Trouble falling or staying asleep, or sleeping too much: (P) Several days-Trouble falling or staying asleep, or sleeping too much: (P) 1  Feeling tired or having little energy: (P) Several days-Feeling tired or having little energy: (P) 1  Poor appetite or overeating: (P) Several days-Poor appetite or overeating: (P) 1  Feeling bad about yourself - or that you are a failure or have let yourself or your family down: (P) Not at all-Feeling bad about yourself - or that you are a failure or have let yourself or your family down: (P) 0  Trouble concentrating on things, such as reading the newspaper or watching television: (P) Several days-Trouble concentrating on things, such as reading the newspaper or watching television: (P) 1  Moving or speaking so slowly that other people could have noticed. Or the opposite - being so fidgety or restless that you have been moving around a lot more than usual: (P) Several days-Moving or speaking so slowly that other people could have noticed. Or the opposite - being so fidgety or restless that you have been moving around a lot more than usual: (P) 1  Thoughts that you would be better off dead, or of hurting yourself in some way: (P) Not at all-Thoughts that you would be better off dead, or of hurting yourself in some way: (P) 0  Total Score: (P) 7        PHQ-9 Score   Depression Severity     Proposed Treatment Actions    (reference)      0  -  4 None - minimal  ??? None     5  -  9  Mild  ??? Watchful waiting; repeat PHQ-9 at follow-up    10 - 14  Moderate  ??? Treatment plan, considering counseling, follow-up and/or pharmacotherapy    15 - 19  Moderately Severe  ??? Active treatment with pharmacotherapy and/or psychotherapy    20 - 27  Severe  ??? Immediate initiation of pharmacotherapy and, if severe impairment or poor response to therapy, expedited referral to a mental health specialist for psychotherapy and/or collaborative management          Functional and Physical Assessment Cognitive & Social Assessments   Visual Acuity:    [x]  Regularly sees ophthalmologist    []  Recommend eye exam    Hearing Screen (whisper test):    []  Denies hearing complaints   []  Able to hear whisper or finger-rub at 6 feet.   []  Audiology referral placed   [x]  Hearing aides present    Weight Loss: []  yes [x]  no    Get Up and Go Test: **>20*    Ambulatory Device:    [x]  none []  cane []   walker []   WC    Fall in past year: [x]  none    []  yes  Afraid of Falling: []  yes       [x]  no    Activities of Daily Living:   []  Independent in all ADLs and IADLs   [x]  Dependent in the following              []   Bathing    []  Groceries            []   Dressings [x]  Driving/bus            []   Toileting []  Telephone            []   Continence []  Chores            []   Grooming []  Cooking            []   Feeding []  Laundry            []  Transferring []  Medications    []  Finances    PHQ-9: **7*  Mini-Cog: *5/5**    Living situation:      []  home   [x]  Assisted Living/board & care        []  lives alone         []  lives with family        [x]  caregiver not needed      []  caregiver support is sufficient       []  has employed caregiver(s)        []  family provides caregiving        []  Provides care for another person      [x]  retired    []  still working    [x]  wears a seatbelt    []  no current tobacco use  [x]  tobacco cessation counseling provided  []  no current alcohol use    []  acceptable amount of alcohol use. Counseled on moderation  []  alcohol cessation counseling provided  [x]  no current drug/substance use  []  substance cessation counseling provided    []  exercises & discussed recommendations  []  unable to exercise given functional limitations    []  Discussed home safety: rugs, bathroom safety (grab-bars, non-slip pads), handrails for stairs, clutter, slippery surfaces, and safe lighting    []  Diet reviewed and recommendations provided on healthy eating    []  I reviewed patient's social history (see above and note for additional details)     Advanced directive:    []  Yes   []  No     []  On file   []  Reviewed     []  Discussed - see below   []  Materials given       # Healthcare Maintenance   []  Refer for mammogram   []  Refer for DXA   []  PAP smear today   []  Order FIT test   []  Refer for colonoscopy   []  Check hepatitis C antibody   []  Check PSA    []  Refer for AAA screening   []  Counseled on flu vaccine and COVID booster vaccine   []  Vaccinations ordered today:     []  Influenza   []  prevnar-13 []  pneumovax-23  []  COVID          []  shingrix 1  []  shingrix 2   []  hepatitis B   []  Tdap/Td        Advanced Care Planning/goals of care discussion. I reviewed with Reygan Muldrow/patient's surrogates today regarding his/her goals of care and advanced care planning on a voluntary basis for the patient/patient's surrogates.      Time spent: []  Initial 30 minutes - at least 16 minutes []  longer than initial 30 minutes    Topics discussed today:   []  Medical conditions  []  Prognosis  []  Goals of care  []  Designation of surrogate medical decision-maker  []  Completion of advanced directives  []  Completion of POLST.  Part A:  []  Attempt resuscitation/CPR  []   Do not attempt resuscitation   Part B:  []  Full treatment  []  Selective treatment  []  Comfort-focused treatment   Part C: []  Long-term artificial nutrition  []  Trial of artificial nutrition []  No artificial nutrition    Details as follows: ** *        Advice/Referrals:   Subsequent annual wellness visit--at least 12 months since last annual wellness visit.       The additional diagnoses assessed at this visit listed below constituted a separate, identifiable service from the routine preventive health examination.

## 2020-02-10 ENCOUNTER — Telehealth: Payer: Commercial Managed Care - Pharmacy Benefit Manager

## 2020-02-10 ENCOUNTER — Ambulatory Visit: Payer: BLUE CROSS/BLUE SHIELD | Attending: Geriatric Medicine

## 2020-02-10 DIAGNOSIS — R1012 Left upper quadrant pain: Secondary | ICD-10-CM

## 2020-02-10 DIAGNOSIS — N644 Mastodynia: Secondary | ICD-10-CM

## 2020-02-10 MED ORDER — DEXLANSOPRAZOLE 60 MG PO CPDR
60 mg | ORAL_CAPSULE | Freq: Every day | ORAL | 3 refills | Status: AC
Start: 2020-02-10 — End: 2020-02-12

## 2020-02-10 MED ORDER — BREO ELLIPTA 100-25 MCG/INH IN AEPB
1 | Freq: Every day | RESPIRATORY_TRACT | 3 refills | Status: AC
Start: 2020-02-10 — End: ?

## 2020-02-10 MED ORDER — CLONAZEPAM 0.5 MG PO TABS
.5 mg | ORAL_TABLET | Freq: Every day | ORAL | 1 refills | Status: AC
Start: 2020-02-10 — End: ?

## 2020-02-10 MED ORDER — DULOXETINE HCL 20 MG PO CPEP
40 mg | ORAL_CAPSULE | Freq: Every day | ORAL | 3 refills | Status: AC
Start: 2020-02-10 — End: 2020-02-12

## 2020-02-10 NOTE — Patient Instructions
Use Breo daily!!  We will get diagnostic mammogram

## 2020-02-10 NOTE — Telephone Encounter
Call Back Request      Reason for call back: [facility] called says they received a electronic fax for 2 medications that were canceled wants to know if that was by accident or not [phone]     Any Symptoms:  []  Yes  []  No       If yes, what symptoms are you experiencing:    o Duration of symptoms (how long):    o Have you taken medication for symptoms (OTC or Rx):      Patient or caller has been notified of the 24-48 hour turnaround time.

## 2020-02-11 ENCOUNTER — Ambulatory Visit: Payer: BLUE CROSS/BLUE SHIELD

## 2020-02-11 ENCOUNTER — Ambulatory Visit: Payer: MEDICARE

## 2020-02-12 MED ORDER — DEXLANSOPRAZOLE 60 MG PO CPDR
60 mg | ORAL_CAPSULE | Freq: Every day | ORAL | 3 refills | Status: AC
Start: 2020-02-12 — End: ?

## 2020-02-12 MED ORDER — DULOXETINE HCL 20 MG PO CPEP
40 mg | ORAL_CAPSULE | Freq: Every day | ORAL | 3 refills | Status: AC
Start: 2020-02-12 — End: ?

## 2020-02-13 MED ORDER — ALBUTEROL SULFATE 108 (90 BASE) MCG/ACT IN AEPB
3 refills | Status: AC
Start: 2020-02-13 — End: ?

## 2020-02-16 ENCOUNTER — Ambulatory Visit: Payer: BLUE CROSS/BLUE SHIELD | Attending: Neurology

## 2020-02-16 DIAGNOSIS — D849 Immunodeficiency, unspecified: Secondary | ICD-10-CM

## 2020-02-16 DIAGNOSIS — R5382 Chronic fatigue, unspecified: Secondary | ICD-10-CM

## 2020-02-16 DIAGNOSIS — K0889 Other specified disorders of teeth and supporting structures: Secondary | ICD-10-CM

## 2020-02-16 MED ORDER — PYRIDOSTIGMINE BROMIDE 60 MG PO TABS
60 mg | ORAL_TABLET | Freq: Three times a day (TID) | ORAL | 2 refills | Status: AC | PRN
Start: 2020-02-16 — End: ?

## 2020-02-16 NOTE — Progress Notes
[facility] Progress Note    PATIENT: [name]  MRN:  [id number]  DOB:  Jan 29, 1936  DATE OF SERVICE:  02/16/2020  ATTENDING PHYSICIAN: No att. providers found  PRIMARY CARE PHYSICIAN: York Ram., MD    ID/CC: Mrs. Yellin is a 84 y.o. 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     Plan from Last Clinic Visit (10/20/19):  - Continue Azathioprine 75 mg BID   - Continue Mestinon BID  - Decrease frequency IVIg 60g (1g/kg) back to q3 weeks ([facility]) and monitor response  - Sleep Study to evaluate for OSA vs Restless Leg syndrome  - Continue with Home PT      Interval Events:    Neck pain - going to accupunture  And lidocainre   Leg fatigue - cna walk 1 block before rest.   Needs to use arms to rise from chiar   No falls  Stubles. Usesc cane   Has walker but does not use it     Interval History from prior visit   At last visit, patient reported that she was progressively getting weaker, and felt that she was getting exhausted faster. She estimated that she was walking 2 blocks before feeling very fatigued, and was previously able to walk 4 blocks. Her IVIG was changed to q2 weeks from q3 weeks. A sleep study was also ordered to evaluate for OSA vs. Restless legs syndrome.     Today, patient states that she has not had much improvement in her symptoms since IVIG was changed. She has not worsened, though. She still is able to walk about 2 blocks. She tried to do physical therapy, but was not able to tolerate it due to pain. She continues to follow up with [facility] for trigger point injections, which help her pain. Lab workup revealed vit B12 deficiency at 217, and she states she was started on supplementation 2 weeks ago. She continues to report restless leg symptoms when trying to sleep. Patient also reports a sleep study that she had about 1 year ago in [name], which showed evidence of sleep apnea. Patient was prescribed a CPAP machine, but was unable to tolerate it.     Current Regimen:  Imuran 75 mg BID  Mestinon 60 mg BID  IVIG 60 g q2w     Previously Failed  Prednisone - oral ulcers    Past Medical History:  Fibromyalgia    Breast cancer (2017) - contained, no tx   Interstitial cystitis x 30 years   Fall with brief LOC (09/2016)  Fall with knee infection/contusion (10/2018)  Depression and anxiety   Diverticulitis   COPD  Asthma     Past Surgical History:  Meniscus tear repair in left knee    Family History:   Adopted at birth. Birth mother may have had breast cancer.   Brother(58) and sister (79) were both adopted from different families    Social History:   She moved from her independent living place in [place], [place] to [place] with her daugther.   Pt  reports that she quit smoking about 36 years ago. She has quit using smokeless tobacco. Smoked 0.5 PPD from 1960 -1985. Denies EtOH or recreational drug use. Retired, college-educated, widowed.     Allergies:  Allergies   Allergen Reactions   ??? Beta Adrenergic Blockers Other (See Comments)     Avoid d/t Myasthenia  Gravis  Avoid d/t Myasthenia Gravis     ??? Levofloxacin Other (See Comments)     Flare of Myasthenia Gravis   ??? Macrolides And Ketolides Other (See Comments)     Avoid d/t Myasthenia Gravis  Use with caution d/t Myasthenia Gravis  Use with caution d/t Myasthenia Gravis     ??? Sulfa Antibiotics Other (See Comments)     May possibly have caused deafness in one ear  May possibly have caused deafness in one ear  other     ??? Botulinum Toxins Other (See Comments)     Muscle weakness r/t Myasthenia Gravis  Muscle weakness r/t Myasthenia Gravis     ??? Statins Other (See Comments)     Muscle aches  Muscle aches  Muscle aches     ??? Plastic Tape Itching and Other (See Comments)     Turns the skin red where applied        Medications:     Outpatient Medications Prior to Visit   Medication Sig   ??? Albuterol Sulfate AEPB Takes 1 to 2 puffs every 4 hours as needed for wheezing or SOB Inhale Takes 1 to 2 puffs every 4 hours as needed for wheezing or SOB .Marland Kitchen   ??? azaTHIOprine 50 mg tablet Take one and one-half tablet (75 mg total) by mouth two (2) times daily.   ??? buPROPion, SR, (WELLBUTRIN SR) 200 mg 12 hr tablet Take 1 tablet (200 mg total) by mouth two (2) times daily.   ??? clonazePAM 0.5 mg tablet Take 1 tablet (0.5 mg total) by mouth daily. Max Daily Amount: 0.5 mg   ??? dexlansoprazole 60 mg DR capsule Take 1 capsule (60 mg total) by mouth daily.   ??? DULoxetine 20 mg DR capsule Take 2 capsules (40 mg total) by mouth daily.   ??? fluocinonide 0.05% external solution    ??? fluticasone 50 mcg/act nasal spray Spray 1 spray by nasal route daily.   ??? fluticasone-vilanterol (BREO ELLIPTA) 100-25 mcg/inh inhaler Inhale 1 puff daily.   ??? fluticasone-vilanterol (BREO ELLIPTA) 100-25 mcg/inh inhaler Inhale 1 puff daily.   ??? GAMMAGARD 30 GM/300ML SOLN every twenty one (21) days .   ??? losartan 25 mg tablet Take 1 tablet (25 mg total) by mouth daily.   ??? montelukast 10 mg tablet Take 10 mg by mouth as needed for.   ??? trimethoprim 100 mg tablet Take 1 tablet (100 mg total) by mouth daily.     No facility-administered medications prior to visit.     Physical Exam  24 hour vitals:    BP 132/65  ~ Pulse 92  ~ Temp 36.3 ???C (97.3 ???F) (Temporal)  ~ Ht 5' 5'' (1.651 m)  ~ Wt 149 lb (67.6 kg)  ~ BMI 24.79 kg/m???      Gen: NAD, well-appearing, cooperative, appears younger than stated age.  Resp:  Able to count to 18 on a single breath.     Neurological Exam  Mental Status  Awake, alert and oriented to person, place and time. Recent and remote memory are intact. Mild dysarthria present. Language is fluent with no aphasia. Attention and concentration are normal. Fund of knowledge is appropriate for level of education.    Cranial Nerves  CN II: Visual fields full to confrontation. Blurry vision but no double vision.  CN III, IV, VI: Extraocular movements intact bilaterally.  CN V: Facial sensation is normal.  CN VII: Full and symmetric facial movement.  CN VIII: Hearing is normal.  CN IX, X: Palate elevates symmetrically. Normal gag reflex.  CN XI: Shoulder shrug strength is normal.  CN XII: Tongue midline without atrophy or fasciculations.  No left ptosis .    Motor  Normal muscle bulk throughout. No fasciculations present. Normal muscle tone.                                             Right                     Left  Neck flexion                           4-                          4-  Neck extension                      4-                          4-   Shoulder abduction               4-                          4-  Elbow flexion                         4-                          4-  Elbow extension                    4-                          4-  Wrist flexion                           4-                          4-  Wrist extension                      4-                          4-  Hip flexion                              4-                          4-  Knee extension                      4-                          4-  Plantarflexion  4                          4  Dorsiflexion                            4                          4    Sensory  Light touch is normal in upper and lower extremities.     Reflexes                                           Right                      Left  Brachioradialis                    3+                         3+  Biceps                                 2+                         2+  Triceps                                2+                         2+  Patellar                                3+                         3+  Achilles                                2+                         2+    Right pathological reflexes: Hoffmann's absent. Ankle clonus absent.  Left pathological reflexes: Hoffmann's absent. Ankle clonus absent.    Coordination  Mild bilateral intention tremors..    Gait  Casual gait: Unable to rise from chair without using arms.  Walks slowly with stooped posture and spatic left leg. .    Labs ???  Labs 08/26/2015: AChR binding (0.63) blocking (28) antibody, ESR 3, CRP 4.9    09/22/2019 11:42 AM    Component Value Ref Range & Units Status   Vitamin B12 217Low   254-1,060 pg/mL Final       Imaging and Diagnostics     DXA 06/17/2019    Impression: The patient has low bone mass, based on the Left Total Hip T-score. The patient has an estimated ten-year risk of hip fracture of 3% and an estimated ten-year risk of  major fracture of 13%, based on the [facility] FRAX algorithm.    Repetitive Nerve stimulation in office 12/23/2018: No decrement after stimulation of CN XI and left ulnar nerve     (from neurologist Dr. Eliane Decree note)    MRI/MRA head 09/09/2015:  This MR angiogram of the intracerebral arteries shows the following:  1. ??????Very minimal atherosclerotic change within the left posterior cerebral artery that is unlikely to be clinically significant.  2. ??????There is a normal variant with the left posterior cerebral artery obtaining its flow from the anterior circulation.  3. ?????????No aneurysms are seen.  4. ?????????No new findings compared to the 11/25/2003 MRA. ??????  ???  MRI orbit wwo contrast 09/09/2015: This is is a normal MRI of the orbits with and without contrast.    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 Uncovertebral joint osteophytes cause severe right C3-C4,   severe right and moderate left C5-C6 and severe left and mild right C6-C7 neural foraminal narrowing. Facet joint degeneration is severe on the left at C2-C3, the right at C3-C4 and C4-C5, and is moderate bilateral and C7-T1.     ASSESSMENT & PLAN:  Mrs. Roselli is a 84 y.o. female with a pmhx of seropositive initially ocular (2017) now generalized myasthenia gravis (AchR binding and blocking +), breast cancer, interstitial cystitis, fibromyalgia, COPD, asthma, 11/2018 with MG exacerbation s/p PLEX and found to have UTI, who is following up for management of MG. Endorses overall decline in function and increased fatigue. At this time there are multiple potential reasons for her increased fatigue and decreased endurance capacity. Question possible cervical myelopathy and generalized deconditioning playing a role in affecting her gait/endurance, and patient has just started home PT for this.  Additionally patient endorses increased day time fatigue with poor sleep and symptoms suggestive of restless leg syndrome. Workup with B12 deficiency, and patient was started on B12 supplementation. With regards to myasthenia gravis, patient's IVIG schedule was increased to q2 weeks from q3 weeks, as patient endorses increased frequency of developing dysarthria/dysphagia, blurry vision, gen weakness faster than before prior to her next infusion. At this point, it appears that her myasthenia is well-controlled, and her symptoms may be from poor sleep, normal aging, and fibromyalgia.    Plan   - Continue Azathioprine 75 mg BID   - Continue Mestinon BID  - Decrease frequency IVIg 60g (1g/kg) back to q3 weeks ([facility]) and monitor response  - Sleep Study to evaluate for OSA vs Restless Leg syndrome  - Continue with Home PT     RTC in 4 months.    Note: greater than 30 minutes spent on this encounter in consultation and in coordination of care.     Jake Shark, M.D. Ph.D.

## 2020-02-29 ENCOUNTER — Ambulatory Visit: Payer: BLUE CROSS/BLUE SHIELD

## 2020-03-02 NOTE — Addendum Note
Addended byLorelei Pont B on: 03/01/2020 06:22 PM     Modules accepted: Orders

## 2020-03-21 ENCOUNTER — Ambulatory Visit: Payer: Commercial Managed Care - Pharmacy Benefit Manager

## 2020-03-21 DIAGNOSIS — D849 Immunodeficiency, unspecified: Secondary | ICD-10-CM

## 2020-03-21 DIAGNOSIS — N1831 Stage 3a chronic kidney disease (HCC/RAF): Secondary | ICD-10-CM

## 2020-03-21 DIAGNOSIS — C50911 Malignant neoplasm of unspecified site of right female breast: Secondary | ICD-10-CM

## 2020-03-21 DIAGNOSIS — E1122 Type 2 diabetes mellitus with diabetic chronic kidney disease: Secondary | ICD-10-CM

## 2020-03-21 DIAGNOSIS — N1832 Type 2 diabetes mellitus with stage 3b chronic kidney disease, without long-term current use of insulin (HCC/RAF): Secondary | ICD-10-CM

## 2020-03-21 NOTE — Consults
PRECHART ***    Hearing Aid Check    [name] was fitted with a hearing aid for the right ear. [name]???was fitted with a BiCROS device on 05/23/2019 and returned the device on 07/04/2019 due to lack of benefit. [name] was seen today for ongoing management of hearing aids.   ???  Hearing Aid:  Manufacturer: Phonak  Model: Audeo P70  Right SN: [id number]  Speaker Length & Power: 2S  Dome Size: Medium vented; changed from left   Battery Size: 312  Initial HAF: 07/22/2019  Hearing Aid Warranty Expiration: 10/06/2022  Right to Return Expiration Date: 09/05/2019    Accessories:   TV connector  ??? SN: [id number]  Warranty: n/a; received as part of promotion     Patient reports:   ??? Batteries aren't lasting as long as others (size 13).   ??? [name] is difficult to insert and is wondering if there is a smaller one.   ??? Sound of the hearing aids are good and clear.   ??? Using the phone on speaker for phone calls rather than the hearing aid due to habit.   ??? Has not needed to adjust the volume on the hearing aid.   ??? Still having difficulty in the dinning room; even with just 4 people due to the acoustics of the den.   ??? Feels right ear hearing decreased without use of the hearing aid.     Actions taken:  ??? Datalogging: could not read due to software update being completed  ??? Hearing aids cleaned and checked. Listening check revealed good sound quality.    ??? Changed dome to medium vented; no feedback present in office and patient confirmed good fit/comfort. Patient provided additional domes.    ??? Dispensed TV Connector and provided patient with [name] hotline for pairing assistance if needed.    ??? Counseled patient on smaller battery size as compared to CROS which explains the decreased battery life.   ??? Informed patient speaker phone is no longer necessary with use of the direct streaming feature to her hearing aid.   ??? Patient declined programming changes (increased volume) as she is satisfied with current settings. Patient reports:   ??? ***    Actions taken:  ??? {Outcomes:304973}    Follow-up Recommendations:  {Follow-up:304971}      Alvina Chou, AuD,        Education:  Topic: {audedutopic1:31062}  Person Taught: {patient/wife/etc:31877}  Barrier: {Barrier:31058}  Needs: {Needs:31059}  Teaching Method: {Teaching Method:24200}    Outcome: {Outcome:31060}

## 2020-03-21 NOTE — Consults
[facility] ***    Adult Audiologic Evaluation    [name] is a 85 y.o. who was seen for an Audiological evaluation.    Pertinent History:  ??? 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 Patient received a vaccine in college for a flu pandemic which she believes may have caused the loss, though they are unsure.   ??? Patient described being seen by ENTs and completing vestibular testing in the past.   ??? Hearing aid(s): Attempted CROS without success, fit with Phonak Audeo P70 in the right ear at [facility] on 07/22/2019   ??? See Hearing Aid Check for device specific reports.   ??? Significant tinnitus; patient could not differentiate between ears.    ??? Dizziness/vertigo that is longstanding.   ??? Denied all other otologic symptoms.     Objective Results:  Otoscopy    Right ear: {Findings:31111}     Left ear:   {Findings:31111}    Tympanometry    Right ear: {Findings:31109}    Left ear:   {Findings:31109}    Acoustic reflexes    Right ear: {findings:31110}    Left ear:   {findings:31110}    Distortion Product Otoacoustic Emissions (DPOAE):    Right ear: {Findings:31360}    Left ear:   {Findings:31360}    Speech awareness threshold (SAT)    Right ear:  *** dB HL    Left ear:  *** dB HL    Soundfield:  *** dB HL    Speech reception threshold (SRT) obtained by {AUD SRT YQMVHQ4:69629}    Right ear: *** dB HL    Left ear:  *** dB HL    Soundfield:  *** dB HL    Word recognition     Right ear: {impaired_unimpaired:31171} at an adequate intensity    Left ear:   {impaired_unimpaired:31171} at an adequate intensity    Audiological Diagnosis:  ??? {Assessment:31081}  ??? {AUDHLIMPACT2:31273}    Recommendations:  ??? {Plan:31082}      Alvina Chou, AuD,        Education:  Topic: {audedutopic1:31062}  Person Taught: {patient/wife/etc:31877}  Barrier: {Barrier:31058}  Needs: {Needs:31059}  Teaching Method: {Teaching Method:24200}    Outcome: {Outcome:31060}        ***insert audiogram here***  For further information, please see Audiological Assessment (located under the Notes tab of Chart Review for [facility] providers).

## 2020-03-22 NOTE — Progress Notes
OUTPATIENT GERIATRICS CLINIC NOTE    PATIENT:  [name]   MRN:  [id number]  DOB:  [birth year]  DATE OF SERVICE:  03/23/2020  PRIMARY CARE PHYSICIAN: York Ram., MD    CHIEF COMPLAINT:   No chief complaint on file.      [place] [facility]:  [employer]    Non[facility] [facility]:    Chaperone status:  No data recorded    HISTORY OF PRESENT ILLNESS     [name] is a 85 y.o. female who presents today for *follow up**. Patient is accompanied by *no one**. History today is per the patient,and review of available recent records in [facility] and Care Everywhere.     Patient is a(n) [x]  reliable []  unreliable historian and additional collateral information was obtained during the visit today from:     Switched shampoo and having less itching.  Bothered by IBS sx.      Ran out of Dexilant and didn't replace it.    Loose stool 4-5 times daily.  Like liquid.      Per chart review, pertinent medical history:  No past medical history on file.  No past surgical history on file.    ALLERGIES     Allergies   Allergen Reactions   ??? Beta Adrenergic Blockers Other (See Comments)     Avoid d/t Myasthenia Gravis  Avoid d/t Myasthenia Gravis     ??? Levofloxacin Other (See Comments)     Flare of Myasthenia Gravis   ??? Macrolides And Ketolides Other (See Comments)     Avoid d/t Myasthenia Gravis  Use with caution d/t Myasthenia Gravis  Use with caution d/t Myasthenia Gravis     ??? Sulfa Antibiotics Other (See Comments)     May possibly have caused deafness in one ear  May possibly have caused deafness in one ear  other     ??? Botulinum Toxins Other (See Comments)     Muscle weakness r/t Myasthenia Gravis  Muscle weakness r/t Myasthenia Gravis     ??? Statins Other (See Comments)     Muscle aches  Muscle aches  Muscle aches     ??? Plastic Tape Itching and Other (See Comments)     Turns the skin red where applied        MEDICATIONS     Personally reviewed.    Medications that the patient states to be currently taking   Medication Sig ??? Albuterol Sulfate AEPB Takes 1 to 2 puffs every 4 hours as needed for wheezing or SOB Inhale Takes 1 to 2 puffs every 4 hours as needed for wheezing or SOB .Marland Kitchen   ??? azaTHIOprine 50 mg tablet Take one and one-half tablet (75 mg total) by mouth two (2) times daily.   ??? buPROPion, SR, (WELLBUTRIN SR) 200 mg 12 hr tablet Take 1 tablet (200 mg total) by mouth two (2) times daily.   ??? clonazePAM 0.5 mg tablet Take 1 tablet (0.5 mg total) by mouth daily. Max Daily Amount: 0.5 mg   ??? DULoxetine 20 mg DR capsule Take 2 capsules (40 mg total) by mouth daily.   ??? fluocinonide 0.05% external solution    ??? fluticasone 50 mcg/act nasal spray Spray 1 spray by nasal route daily.   ??? fluticasone-vilanterol (BREO ELLIPTA) 100-25 mcg/inh inhaler Inhale 1 puff daily.   ??? fluticasone-vilanterol (BREO ELLIPTA) 100-25 mcg/inh inhaler Inhale 1 puff daily.   ??? GAMMAGARD 30 GM/300ML SOLN every twenty one (21) days .   ???  losartan 25 mg tablet Take 1 tablet (25 mg total) by mouth daily.   ??? montelukast 10 mg tablet Take 10 mg by mouth as needed for.   ??? pyridostigmine 60 mg tablet Take 1 tablet (60 mg total) by mouth three (3) times daily as needed.   ??? trimethoprim 100 mg tablet Take 1 tablet (100 mg total) by mouth daily.   ??? [DISCONTINUED] dexlansoprazole 60 mg DR capsule Take 1 capsule (60 mg total) by mouth daily.       SOCIAL HISTORY     Social History     Social History Narrative   ??? Not on file        FUNCTIONAL STATUS     BADLs:  IADLs:    Ambulates with   Falls in past year:  Afraid of falling:    GERIATRIC REVIEW OF SYSTEMS     Vision:    []   glasses     []  legally blind  Hearing: []  hearing aids    Nutrition: []  normal   []  impaired  []  vegan  []  vegetarian  []  low salt  []  low-carb   Swallowing: []  impaired   Dentures:   []  yes    Depression:  []  yes   Cognition:     Incontinence: [] urine  []  fecal  []  urine and fecal      ADVANCED CARE PLANNING     Advanced directives on file: []  No  []   Yes ??? Completed:     Medical DPOA on file:   []   Yes ???   []   No ??? Patient designates ** * to be their surrogate medical decision-maker.         HEALTH CARE MAINTENANCE     IMMUNIZATIONS:   Immunization History   Administered Date(s) Administered   ??? COVID-19, mRNA, (Pfizer - Purple Cap) 30 mcg/0.3 mL 03/11/2019, 04/01/2019, 10/29/2019   ??? DTaP 12/31/2015   ??? influenza vaccine IM quadrivalent (Fluzone Quad) MDV (44 months of age and older) 11/26/2016   ??? influenza vaccine IM quadrivalent adjuvanted (FluAD Quad) (PF) SYR (87 years of age and older) 11/05/2018, 12/01/2019   ??? influenza, unspecified formulation 04/01/2019, 04/01/2019, 12/01/2019, 12/01/2019, 12/01/2019, 12/01/2019   ??? pneumococcal polysaccharide vaccine 23-valent (Pneumovax) 01/10/2019       Mammogram:  DXA:  PAP:  Colonoscopy:      PHYSICAL EXAM     BP 119/68 (BP Location: Left arm, Patient Position: Sitting, Cuff Size: Regular)  ~ Pulse 90  ~ Temp 36 ???C (96.8 ???F) (Tympanic)  ~ Resp 16  ~ Ht 5' 5'' (1.651 m)  ~ Wt 146 lb (66.2 kg)  ~ SpO2 95% Comment: room air ~ BMI 24.30 kg/m???   Wt Readings from Last 3 Encounters:   03/23/20 146 lb (66.2 kg)   02/16/20 149 lb (67.6 kg)   02/10/20 148 lb (67.1 kg)         System Check if normal Positive or additional negative findings   GEN  [x]  NAD     Eyes  []  Conj/Lids []  Pupils  []  Fundi   []  Sclerae []  EOM     ENT  []  External ears   []  Otoscopy   []  Gross Hearing    []   External nose   []  Nasal mucosa   []  Lips/teeth/gums    []  Oropharynx    []  Mucus membranes      Neck  [x]  Inspection/palpation    [x]  Thyroid     Resp  [  x] Effort    [x]  Auscultation       CV  []  Rhythm/rate   [x]  Murmurs   [x]  Edema   []  JVP non-elevated    Normal pulses:   []  Radial []  Femoral  []  Pedal     Breast  []  Inspection []  Palpation     GI  []  Bowel sounds    [x]  Nontender   [x]  No distension    []  No rebound or guarding   []  No masses   []  Liver/spleen    []  Rectal     GU  F:  []  External []  vaginal wall         []  Cervix     []  mucus        []  Uterus    []  Adnexa   M:  []  Scrotum []  Penis         []  Prostate     Lymph  [x]  Cervical []  supraclavicular     [x]  Axillae   []  Groin/inguinal     MSK []  Gait  []  Back     Specify site examined:    []  Inspect/palp []  ROM   []  Stability []  Strength/tone []  Used arms to push up from seated to standing position   Assistive device:  []  single point cane []  quad cane  []  FWW []  rollator walker      Skin  []  Inspection []  Palpation     Neuro  []  Alert and oriented     []  CN2-12 intact grossly   []  DTR      []  Muscle strength      []  Sensation   []  Pronator drift   []  Finger to Nose/Heel to Shin   []  Romberg     Psych  []  Insight/judgement     []  Mood/affect    []  Gross cognition            LABS/STUDIES     LABS:  Lab Results   Component Value Date    WBC 4.87 02/16/2020    WBC 4.71 11/25/2019    WBC 4.60 09/22/2019    HGB 12.4 02/16/2020    HGB 13.8 11/25/2019    HGB 12.3 09/22/2019    MCV 99.5 (H) 02/16/2020    PLT 253 02/16/2020    PLT 374 11/25/2019    PLT 342 09/22/2019     Lab Results   Component Value Date    NA 140 02/16/2020    NA 139 11/25/2019    NA 139 09/22/2019    K 4.3 02/16/2020    K 3.9 11/25/2019    K 4.1 09/22/2019    CREAT 1.14 02/16/2020    CREAT 1.22 11/25/2019    CREAT 1.10 09/22/2019    GFRESTNOAA 45 02/16/2020    GFRESTNOAA 42 11/25/2019    GFRESTNOAA 47 09/22/2019    GFRESTAA 52 02/16/2020    GFRESTAA 48 11/25/2019    GFRESTAA 54 09/22/2019    CALCIUM 9.3 02/16/2020    CALCIUM 9.6 11/25/2019    CALCIUM 9.0 09/22/2019     Lab Results   Component Value Date    ALT 15 02/16/2020    ALT 13 11/25/2019    AST 22 02/16/2020    AST 29 11/25/2019    ALKPHOS 88 02/16/2020    BILITOT 0.2 02/16/2020    ALBUMIN 3.8 (L) 02/16/2020    ALBUMIN 4.1 11/25/2019     Lab Results   Component Value Date    TSH  1.00 12/17/2018     Lab Results   Component Value Date    HGBA1C 4.9 09/22/2019     No results found for: CHOL, CHOLDLCAL  No results found for: CHOLDLQ  No results found for: [facility]  Lab Results   Component Value Date    FE 87 07/14/2019 FERRITIN 57 07/14/2019    FOLATE 9.6 11/25/2019    TIBC 356 07/14/2019     Lab Results   Component Value Date    VITAMINB12 3,994 (H) 11/25/2019    VITAMINB12 217 (L) 09/22/2019     Lab Results   Component Value Date    BNP 92 09/22/2019     No results found for: PSATOTAL      STUDIES:    EKG  This data element was independently reviewed and interpreted by me     NSR NO ST or T changes    XR CHEST PA LAT 2V  September 22, 2019???  COMPARISON: KUB December 17, 2018  ???  History: sob  ???  FINDINGS:  ???  Lungs: Clear  Heart/aorta: Normal heart size. The thoracic aorta is mildly calcified, tortuous and ectatic.  Adenopathy: None  Pleura: No effusion  Bones and Chest wall: No acute bony or body wall  findings. Osteopenia and bony maturational changes. Right upper quadrant cholecystectomy clips stable from October 2020  ???  ???  IMPRESSION:  ???  No acute findings or abnormalities related to provided history.    ASSESSMENT and PLAN     Savahna Casados is a 85 y.o. female who presents today for *follow up**.    #Diarrhea--check Stool studies, CBC, TSH, etc.  #Mastalgia--check diagnostic mammogram.  Exam unrevealing.  Scheduled for Feb.  #HTN--Increased nifedipine CR 60 mg daily. Continue losartan 25 mg.    #Aortic Calcification--noted on chest imaging     September 22, 2019???       --Not Worsening.  Chronic.  Will monitor and treat underlying risk factors with medical and lifestyle interventions as warranted by balance between benefits and burdens.  #allergic rhinitis--trial flonase  #Macrocytosis--  #Deconditioning--initiate home PT  #GERD--s/p Nisan fundoplication  #COPD--mixed--per PFT's from [name].--consider reinitiation of inhalers.  #s/p rectocele repair  #s/p cholecystectomy  #Immunosupressed status--medication related.  #Nocturnal hypoxemia--presumably related to MG crisis but has history of COPD. ???Will order nocturnal home O2 study and consider PFT's/Pulmonary evaluation once settled down. ???May need to restart BREO inhaler.  #Myasthenia Gravis--s/p plasmapheresis x5 October 2020, no Thymoma, on Azathioprine 150 daily and mestinon 60 tid.??????Saw???Dr. Enedina Finner at [facility]. ???Was considering Soliris.??????Ordering IVIG and MRI.  Encourage ongoing neurology follow up.   #IBS--titrate miralax to comfort. ???Consider duloxetine substitute for buproprion.  #Interstitial cystitis--on daily Trimethoprim 100 for UTI prophylaxis. ???Using bladder instillations prn. OFF topical estrogen due to advice from neurologist re: MG.  To see Dr. Desma Maxim to see if she has additional insights.  #Major Depression--active but appears better s/p  increase Duloxetine to 40 mg daily. Referred patient to Dr. Randie Heinz previously.  #Allergic rhinitis--previously on nasal steroids.  #Breast CA--DCIS???by report--obtain mammo. ???Patient declined surgery in 2019 and experienced dry mouth from Tamoxifen. ???Was considering Anastrozole. Await records from prior mammogram and treatment.  Per records from [facility]:   ''07/20/2017 Screening detected right breast calcifications and distortion. The distortion was in the upper outer quadrant: Biopsy DCIS intermediate grade with CSL; calcifications UIQ: 2.8 cm: Biopsy DCIS+ ALH +CSL; 4 cm apart, axilla negative, Tis NX stage 0''  Due for repeat mammo Apirl 2022  #  DM with CKD--stabe 3b--based on labs from [name]. ???FOllow here. ???If BP permits, may consider ARB BUT patient with h/o angioedema so will ONLY initiate this agent with great caution.  Refer to Ophthalmology.  #OA knees--previously getting hyaluronic acid injections.  #Anxiety  #Benzodiazepine dependence--down titrating to one clonazepam 0.5 mg  #Fibromyalgia--duloxetine as above. 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  #Statin intolerance      FOLLOW-UP     RTC     Future Appointments   Date Time Provider Department Center   03/23/2020 11:40 AM York Ram., MD GERI IMS 420 MEDICINE   04/05/2020  1:15 PM Alvina Chou, AuD AUD [id number] AUDIOLOGY/SP 04/05/2020  1:45 PM Alvina Chou, AuD AUD [phone] AUDIOLOGY/SP   04/11/2020  9:15 AM MP2 MAM PANEL 01-RADIOLOGY MAM MP2 [place] [facility]   05/03/2020  1:30 PM Shoku, Bita, OD DS VIS REHAB OPH   08/16/2020 11:30 AM NEU MG CLINIC [facility]IE[facility] NEUMUSC B200 NEUROLOGY         The above plan of care, diagnosis, orders, and follow-up were discussed with the patient and/or surrogate. Questions related to this recommended plan of care were answered.    ANALYSIS OF DATA (Needs to meet 1 category for moderate and 2 categories for high LOS)     I have:     Category 1 (Needs 3 for moderate and high LOS)     []  Reviewed []  1 []  2 []  ? 3 unique laboratory, radiology, and/or diagnostic tests noted below    Test/Study:  on date .    []  Reviewed []  1 []  2 []  ? 3 prior external notes and incorporated into patient assessment    I reviewed Dr. 's note in specialty  from date .    []  Discussed management or test interpretation with external provider(s) as noted      []  Ordered []  1 []  2 []  ? 3 unique laboratory, radiology, and/or diagnostic tests noted in A&P    []  Obtained history from independent historian:       Category 2  []  Independently interpreted the test    Category 3  []  Discussed management or test interpretation with external provider(s) as noted      INTERACTION COMPLEXITY and SOCIAL DETERMINANTS of HEALTH       PROBLEM COMPLEXITY   []  New problem with uncertain diagnosis or prognosis (moderate)   []  Multiple stable chronic problems (moderate)   []  Chronic problem not stable - not controlled, symptomatic, or worsening (moderate)   []  Severe exacerbation of chronic problem (high)   []  New or chronic problem that poses threat to life or bodily function (high)     MANAGEMENT COMPLEXITY   []  Old or external/outside records reviewed   []  Discussion with alternate (proxy) if patient with impaired communication / comprehension ability (e.g., dementia, aphasia, severe hearing loss).   []  Repeated questions (or disagreement) between patient and/among caregivers/family during the visit.  []  Caregiver/patient emotions/behavior/beliefs interfering with implementation of treatment plan.  []  Independent interpretation of test (EKG, Chest XRay)   []  Discussion of case with a consultant physician     RISK LEVEL   []  Prescription drug management (moderate)   []  Minor surgery with CV risk factors or elective major surgery (moderate)   []  Dx or Rx significantly limited by SDoH (inadequate housing, living alone, poor health care access, inappropriate diet; low literacy) (moderate)   []  Major surgery - elective with CV risk factors or emergent (  high)   []  Need for hospitalization (high)   []  New DNR or de-escalation of care (high)      SDoH  The diagnosis or treatment of said conditions is significantly limited by the following social determinants of health:  []  Z59.0 Homelessness  []  Z59.1 Inadequate housing  []  Z59.2 Discord with neighbors, lodgers and landlord  []  Z60.2 Problems related to living alone  []  Z59.8 Other problems related to housing and economic circumstances  []  Z59.4 Lack of adequate food and safe drinking water  []  Z59.6 Low income  []  Z59.7 Insufficient social insurance and welfare support  []  Z59.9 Problems related to housing and economic circumstances, unspecified  []  Z75.3 Unavailability and inaccessibility of health care facilities  []  Z75.4 Unavailability and inaccessibility of other helping agencies  []  Z72.4 Inappropriate diet and eating habits  []  Z62.820 Parent-biological child conflict  []  Z63.8 Other specified problems related to primary support group  []  Z55.0 Illiteracy and low level literacy   []  Z56.9 Unspecified problems related to employment        If Billing Based on Time:     I performed the following items on the day of service:    [x]  Preparing to see the patient (e.g., review of tests)  [x]  Obtaining and/or reviewing separately obtained history   [x]  Performing a medically appropriate examination and/or evaluation   [x]  Counseling and educating the patient/family/caregiver   [x]  Ordering medications, tests, or procedures  [x]  Referring and communicating with other healthcare professionals (when not separately reported)  [x]  Documenting clinical information in the EHR  []  Independently interpreting results and communicating results to patient/family/caregiver    I spent the following total amount of time on these tasks on the day of service:  New Patient     Established Patient  []  15-29 minutes ??? [id number]    []  up to 9 minutes ??? [id number]  []  30-44 minutes ??? [id number]     []  10-19 minutes ??? [id number]   []  45-59 minutes ??? [id number]      []  20-29 minutes ??? [id number]   []  60-74 minutes ??? [id number]   []  30-39 minutes ??? [id number]         [x]  40-55 minutes ??? [id number]    []  I spent an additional ** * 15-minute-increment(s) for a total of * ** minutes on these tasks on the day of service. [phone] for each additional 15 minutes.)    Author: York Ram, MD 03/23/2020

## 2020-03-23 ENCOUNTER — Ambulatory Visit: Payer: BLUE CROSS/BLUE SHIELD | Attending: Geriatric Medicine

## 2020-03-23 DIAGNOSIS — R197 Diarrhea, unspecified: Secondary | ICD-10-CM

## 2020-03-29 ENCOUNTER — Ambulatory Visit: Payer: BLUE CROSS/BLUE SHIELD | Attending: Audiologist

## 2020-03-29 ENCOUNTER — Ambulatory Visit: Payer: Commercial Managed Care - Pharmacy Benefit Manager | Attending: Audiologist

## 2020-04-04 ENCOUNTER — Ambulatory Visit: Payer: BLUE CROSS/BLUE SHIELD

## 2020-04-04 DIAGNOSIS — H9190 Unspecified hearing loss, unspecified ear: Secondary | ICD-10-CM

## 2020-04-05 ENCOUNTER — Ambulatory Visit: Payer: BLUE CROSS/BLUE SHIELD | Attending: Audiologist

## 2020-04-05 ENCOUNTER — Ambulatory Visit: Payer: Commercial Managed Care - Pharmacy Benefit Manager | Attending: Audiologist

## 2020-04-06 ENCOUNTER — Telehealth: Payer: MEDICARE

## 2020-04-06 NOTE — Telephone Encounter
Received fax from [facility] requesting clinical notes to assist with PA request for continuation of IVIG treatments. Fax# [phone], received confirmation.

## 2020-04-11 ENCOUNTER — Ambulatory Visit: Payer: BLUE CROSS/BLUE SHIELD | Attending: Geriatric Medicine

## 2020-04-11 NOTE — Consults
PRECHART ***    Hearing Aid Check    [name] was fitted with a hearing aid for the right ear. [name]???was fitted with a BiCROS device on 05/23/2019 and returned the device on 07/04/2019 due to lack of benefit. [name] was seen today for ongoing management of hearing aids.   ???  Hearing Aid:  Manufacturer: Phonak  Model: Audeo P70  Right SN: [id number]  Speaker Length & Power: 2S  Dome Size: Medium vented; changed from left   Battery Size: 312  Initial HAF: 07/22/2019  Hearing Aid Warranty Expiration: 10/06/2022  Right to Return Expiration Date: 09/05/2019    Accessories:   TV connector  ??? SN: [id number]  Warranty: n/a; received as part of promotion     Patient reports:   ??? Batteries aren't lasting as long as others (size 13).   ??? [name] is difficult to insert and is wondering if there is a smaller one.   ??? Sound of the hearing aids are good and clear.   ??? Using the phone on speaker for phone calls rather than the hearing aid due to habit.   ??? Has not needed to adjust the volume on the hearing aid.   ??? Still having difficulty in the dinning room; even with just 4 people due to the acoustics of the den.   ??? Feels right ear hearing decreased without use of the hearing aid.     Actions taken:  ??? Datalogging: could not read due to software update being completed  ??? Hearing aids cleaned and checked. Listening check revealed good sound quality.    ??? Changed dome to medium vented; no feedback present in office and patient confirmed good fit/comfort. Patient provided additional domes.    ??? Dispensed TV Connector and provided patient with [employer] hotline for pairing assistance if needed.    ??? Counseled patient on smaller battery size as compared to CROS which explains the decreased battery life.   ??? Informed patient speaker phone is no longer necessary with use of the direct streaming feature to her hearing aid.   ??? Patient declined programming changes (increased volume) as she is satisfied with current settings. Patient reports:   ??? ***    Actions taken:  ??? {Outcomes:304973}    Follow-up Recommendations:  {Follow-up:304971}      Alvina Chou, AuD,        Education:  Topic: {audedutopic1:31062}  Person Taught: {patient/wife/etc:31877}  Barrier: {Barrier:31058}  Needs: {Needs:31059}  Teaching Method: {Teaching Method:24200}    Outcome: {Outcome:31060}

## 2020-04-11 NOTE — Consults
[facility] ***    Adult Audiologic Evaluation    [name] is a 85 y.o. who was seen for an Audiological evaluation.    Pertinent History:  ??? 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 Patient received a vaccine in college for a flu pandemic which she believes may have caused the loss, though they are unsure.   ??? Patient described being seen by ENTs and completing vestibular testing in the past.   ??? Hearing aid(s): Attempted CROS without success, fit with Phonak Audeo P70 in the right ear at [facility] on 07/22/2019   ??? See Hearing Aid Check for device specific reports.   ??? Significant tinnitus; patient could not differentiate between ears.    ??? Dizziness/vertigo that is longstanding.   ??? Denied all other otologic symptoms.     Objective Results:  Otoscopy    Right ear: {Findings:31111}     Left ear:   {Findings:31111}    Tympanometry    Right ear: {Findings:31109}    Left ear:   {Findings:31109}    Acoustic reflexes    Right ear: {findings:31110}    Left ear:   {findings:31110}    Distortion Product Otoacoustic Emissions (DPOAE):    Right ear: {Findings:31360}    Left ear:   {Findings:31360}    Speech awareness threshold (SAT)    Right ear:  *** dB HL    Left ear:  *** dB HL    Soundfield:  *** dB HL    Speech reception threshold (SRT) obtained by {AUD SRT YQMVHQ4:69629}    Right ear: *** dB HL    Left ear:  *** dB HL    Soundfield:  *** dB HL    Word recognition     Right ear: {impaired_unimpaired:31171} at an adequate intensity    Left ear:   {impaired_unimpaired:31171} at an adequate intensity    Audiological Diagnosis:  ??? {Assessment:31081}  ??? {AUDHLIMPACT2:31273}    Recommendations:  ??? {Plan:31082}      Alvina Chou, AuD,        Education:  Topic: {audedutopic1:31062}  Person Taught: {patient/wife/etc:31877}  Barrier: {Barrier:31058}  Needs: {Needs:31059}  Teaching Method: {Teaching Method:24200}    Outcome: {Outcome:31060}        ***insert audiogram here***  For further information, please see Audiological Assessment (located under the Notes tab of Chart Review for [facility] providers).

## 2020-04-20 ENCOUNTER — Ambulatory Visit: Payer: BLUE CROSS/BLUE SHIELD

## 2020-04-23 ENCOUNTER — Telehealth: Payer: BLUE CROSS/BLUE SHIELD

## 2020-04-23 MED ORDER — CLONAZEPAM 1 MG PO TABS
1 mg | ORAL_TABLET | Freq: Two times a day (BID) | ORAL | 1 refills | Status: AC
Start: 2020-04-23 — End: ?

## 2020-04-26 ENCOUNTER — Ambulatory Visit: Payer: BLUE CROSS/BLUE SHIELD | Attending: Audiologist

## 2020-04-26 DIAGNOSIS — H903 Sensorineural hearing loss, bilateral: Secondary | ICD-10-CM

## 2020-04-26 NOTE — Patient Instructions
Accessing Audiology notes online:    The report is viewable in [facility] ''MyChart'' under clinical notes    1. Hover your mouse over the ''Visits'' icon.   2. Click on the ''Appointments and Visits'' link.   3. Click on the desired visit.   4. Click on the ''Clinical Notes'' tab located below the ''Appointment Details'' header.   5. Listed are the types of Ambulatory Notes you may see:   ??? Progress Notes   ??? Consult   ??? Procedure   ??? H&P   ??? Goals of Care   ??? Interdisciplinary Notes     **All of Audiology visit notes (Reports) are considered ''Consult Notes''.     Please call [phone] for additional help if you still cannot see notes.         A copy of the audiogram/reports can be obtained by contacting Medical Records:    Phone: [phone] [facility] Medical Records    In person during the hours of 8:00 AM-4:30 PM, Monday-Friday    [facility]- [address], [address] [place], [place] [address]    [facility]- [address], [address], [place] [address]          Brayton Mars acceder a sus informes de Audiolog???a en l???nea:      El informe se puede ver en [place] ''MyChart''. ?Se encuentra bajo notas cl???nicas.      Para acceder al informe:   1.  Pose el cursor [employer] ???icono de ???Visits.??? (consultas)  2.  Presione el enlace de ???Appointments and Visits??? (citas y consultas)  3.  Haga clic en la consulta deseada  4.  Haga clic en la pesta???a ''Clinical notes??? (notas cl???nicas), situado debajo del rubro ''Appontment  Details'' (detalles de la cita)  5.  A continuaci???n, se enumeran los tipos de notas ambulatorias que podr??? ver:   ??? Progress Notes (notas evolutivas)  ??? Consult (interconsulta)   ??? Procedure (procedimiento)    ??? H&P (historial m???dico y un examen f???sico)   ??? Goals of Care (metas de tratamiento)   ??? Interdisciplinary Notes (notas interdisciplinarias)    **Todas las notas de la consulta en Audiolog???a (Reports) se consideran ???Consult notes??? (notas de interconsulta)     Por favor llame al [phone] para obtener ayuda adicional si todav???a no puede ver las notas.        Una copia del audiograma/informes se pueden obtener al comunicarse con el archivo de historia cl???nica     Llame por Tel???fono al: [phone], [facility] Medical Records (archivo de historia cl???nica en [place])     Atenci???n disponible en persona durante las horas entre 8:00 AM-4:30 PM, de lunes a viernes     [facility]- [address], [address] [place], [place] [address]     [facility]- [address], [place] [address]

## 2020-04-30 ENCOUNTER — Ambulatory Visit: Payer: BLUE CROSS/BLUE SHIELD

## 2020-05-03 ENCOUNTER — Ambulatory Visit: Payer: BLUE CROSS/BLUE SHIELD

## 2020-05-13 ENCOUNTER — Ambulatory Visit: Payer: BLUE CROSS/BLUE SHIELD

## 2020-05-15 NOTE — Progress Notes
OUTPATIENT GERIATRICS CLINIC NOTE    PATIENT:  [name]   MRN:  [id number]  DOB:  12-19-1935  DATE OF SERVICE:  05/18/2020  PRIMARY CARE PHYSICIAN: York Ram., MD    CHIEF COMPLAINT:   No chief complaint on file.      [facility] [facility]:  [employer]    [facility] Specialists:    Chaperone status:  No data recorded    HISTORY OF PRESENT ILLNESS     Senita Corredor is a 85 y.o. female who presents today for *follow up**. Patient is accompanied by *no one**. History today is per the patient,and review of available recent records in [facility] and Care Everywhere.     Patient is a(n) [x]  reliable []  unreliable historian and additional collateral information was obtained during the visit today from:     Had been quite jittery and nervous.      Feels stronger with every other week infusion.      Per chart review, pertinent medical history:  No past medical history on file.  No past surgical history on file.    ALLERGIES     Allergies   Allergen Reactions   ??? Beta Adrenergic Blockers Other (See Comments)     Avoid d/t Myasthenia Gravis  Avoid d/t Myasthenia Gravis     ??? Levofloxacin Other (See Comments)     Flare of Myasthenia Gravis   ??? Macrolides And Ketolides Other (See Comments)     Avoid d/t Myasthenia Gravis  Use with caution d/t Myasthenia Gravis  Use with caution d/t Myasthenia Gravis     ??? Sulfa Antibiotics Other (See Comments)     May possibly have caused deafness in one ear  May possibly have caused deafness in one ear  other     ??? Botulinum Toxins Other (See Comments)     Muscle weakness r/t Myasthenia Gravis  Muscle weakness r/t Myasthenia Gravis     ??? Statins Other (See Comments)     Muscle aches  Muscle aches  Muscle aches     ??? Plastic Tape Itching and Other (See Comments)     Turns the skin red where applied        MEDICATIONS     Personally reviewed.    No outpatient medications have been marked as taking for the 05/18/20 encounter (Appointment) with York Ram., MD.       SOCIAL HISTORY Social History     Social History Narrative   ??? Not on file        FUNCTIONAL STATUS     BADLs:  IADLs:    Ambulates with   Falls in past year:  Afraid of falling:    GERIATRIC REVIEW OF SYSTEMS     Vision:    []   glasses     []  legally blind  Hearing: []  hearing aids    Nutrition: []  normal   []  impaired  []  vegan  []  vegetarian  []  low salt  []  low-carb   Swallowing: []  impaired   Dentures:   []  yes    Depression:  []  yes   Cognition:     Incontinence: [] urine  []  fecal  []  urine and fecal      ADVANCED CARE PLANNING     Advanced directives on file: []  No  []   Yes ??? Completed:     Medical DPOA on file:   []   Yes ???   []   No ??? Patient designates ** * to be their  surrogate medical decision-maker.         HEALTH CARE MAINTENANCE     IMMUNIZATIONS:   Immunization History   Administered Date(s) Administered   ??? COVID-19, mRNA, (Pfizer - Purple Cap) 30 mcg/0.3 mL 03/11/2019, 04/01/2019, 10/29/2019   ??? DTaP 12/31/2015   ??? influenza vaccine IM quadrivalent (Fluzone Quad) MDV (40 months of age and older) 11/26/2016   ??? influenza vaccine IM quadrivalent adjuvanted (FluAD Quad) (PF) SYR (59 years of age and older) 11/05/2018, 12/01/2019   ??? influenza, unspecified formulation 04/01/2019, 04/01/2019, 12/01/2019, 12/01/2019, 12/01/2019, 12/01/2019   ??? pneumococcal polysaccharide vaccine 23-valent (Pneumovax) 01/10/2019       Mammogram:  DXA:  PAP:  Colonoscopy:      PHYSICAL EXAM     BP 151/70 (BP Location: Right arm, Patient Position: Sitting, Cuff Size: Regular)  ~ Pulse (!) 102  ~ Temp 36.4 ???C (97.6 ???F) (Temporal)  ~ Ht 5' 5'' (1.651 m)  ~ Wt 147 lb (66.7 kg)  ~ SpO2 95% Comment: room air ~ BMI 24.46 kg/m???   Wt Readings from Last 3 Encounters:   05/18/20 147 lb (66.7 kg)   03/23/20 146 lb (66.2 kg)   02/16/20 149 lb (67.6 kg)         System Check if normal Positive or additional negative findings   GEN  [x]  NAD     Eyes  []  Conj/Lids []  Pupils  []  Fundi   []  Sclerae []  EOM     ENT  []  External ears   []  Otoscopy   []  Gross Hearing    []   External nose   []  Nasal mucosa   []  Lips/teeth/gums    []  Oropharynx    []  Mucus membranes      Neck  [x]  Inspection/palpation    [x]  Thyroid     Resp  [x]  Effort    [x]  Auscultation       CV  []  Rhythm/rate   [x]  Murmurs   [x]  Edema   []  JVP non-elevated    Normal pulses:   []  Radial []  Femoral  []  Pedal     Breast  []  Inspection []  Palpation     GI  []  Bowel sounds    [x]  Nontender   [x]  No distension    []  No rebound or guarding   []  No masses   []  Liver/spleen    []  Rectal     GU  F:  []  External []  vaginal wall         []  Cervix     []  mucus        []  Uterus    []  Adnexa   M:  []  Scrotum []  Penis         []  Prostate     Lymph  [x]  Cervical []  supraclavicular     [x]  Axillae   []  Groin/inguinal     MSK []  Gait  []  Back     Specify site examined:    []  Inspect/palp []  ROM   []  Stability []  Strength/tone []  Used arms to push up from seated to standing position   Assistive device:  []  single point cane []  quad cane  []  FWW []  rollator walker      Skin  []  Inspection []  Palpation     Neuro  []  Alert and oriented     []  CN2-12 intact grossly   []  DTR      []  Muscle strength      []  Sensation   []   Pronator drift   []  Finger to Nose/Heel to Shin   []  Romberg     Psych  []  Insight/judgement     []  Mood/affect    []  Gross cognition            LABS/STUDIES     LABS:  Lab Results   Component Value Date    WBC 6.10 03/23/2020    WBC 4.87 02/16/2020    WBC 4.71 11/25/2019    HGB 12.7 03/23/2020    HGB 12.4 02/16/2020    HGB 13.8 11/25/2019    MCV 97.9 03/23/2020    PLT 418 (H) 03/23/2020    PLT 253 02/16/2020    PLT 374 11/25/2019     Lab Results   Component Value Date    NA 139 03/23/2020    NA 140 02/16/2020    NA 139 11/25/2019    K 4.3 03/23/2020    K 4.3 02/16/2020    K 3.9 11/25/2019    CREAT 1.59 (H) 03/23/2020    CREAT 1.14 02/16/2020    CREAT 1.22 11/25/2019    GFRESTNOAA 30 03/23/2020    GFRESTNOAA 45 02/16/2020    GFRESTNOAA 42 11/25/2019    GFRESTAA 35 03/23/2020    GFRESTAA 52 02/16/2020 GFRESTAA 48 11/25/2019    CALCIUM 9.4 03/23/2020    CALCIUM 9.3 02/16/2020    CALCIUM 9.6 11/25/2019     Lab Results   Component Value Date    ALT 20 03/23/2020    ALT 15 02/16/2020    AST 28 03/23/2020    AST 22 02/16/2020    ALKPHOS 80 03/23/2020    BILITOT 0.3 03/23/2020    ALBUMIN 4.2 03/23/2020    ALBUMIN 3.8 (L) 02/16/2020     Lab Results   Component Value Date    TSH 2.8 03/23/2020    TSH 1.00 12/17/2018     Lab Results   Component Value Date    HGBA1C 5.2 03/23/2020    HGBA1C 4.9 09/22/2019     No results found for: CHOL, CHOLDLCAL  No results found for: CHOLDLQ  No results found for: [facility]  Lab Results   Component Value Date    FE 87 07/14/2019    FERRITIN 57 07/14/2019    FOLATE 9.6 11/25/2019    TIBC 356 07/14/2019     Lab Results   Component Value Date    VITAMINB12 3,994 (H) 11/25/2019    VITAMINB12 217 (L) 09/22/2019     Lab Results   Component Value Date    BNP 92 09/22/2019     No results found for: PSATOTAL      STUDIES:    EKG  This data element was independently reviewed and interpreted by me     NSR NO ST or T changes    XR CHEST PA LAT 2V  September 22, 2019???  COMPARISON: KUB December 17, 2018  ???  History: sob  ???  FINDINGS:  ???  Lungs: Clear  Heart/aorta: Normal heart size. The thoracic aorta is mildly calcified, tortuous and ectatic.  Adenopathy: None  Pleura: No effusion  Bones and Chest wall: No acute bony or body wall  findings. Osteopenia and bony maturational changes. Right upper quadrant cholecystectomy clips stable from October 2020  ???  ???  IMPRESSION:  ???  No acute findings or abnormalities related to provided history.    ASSESSMENT and PLAN     Alley Neils is a 85 y.o. female who presents today for *follow up**.        #  Mastalgia--check diagnostic mammogram.  Exam unrevealing.  Scheduled for Feb.  #HTN--Increased nifedipine CR 60 mg daily. Continue losartan 25 mg.    May need uptitration of losartan.  Did not take meds today.  #Aortic Calcification--noted on chest imaging     September 22, 2019???       --Not Worsening.  Chronic.  Will monitor and treat underlying risk factors with medical and lifestyle interventions as warranted by balance between benefits and burdens.  #allergic rhinitis--trial flonase  #Macrocytosis--  #Deconditioning--initiate home PT  #GERD--s/p Nisan fundoplication  #COPD--mixed--per PFT's from [name].--consider reinitiation of inhalers.  #s/p rectocele repair  #s/p cholecystectomy  #Immunosupressed status--medication related.  #Nocturnal hypoxemia--presumably related to MG crisis but has history of COPD. ???Will order nocturnal home O2 study and consider PFT's/Pulmonary evaluation once settled down. ???May need to restart BREO inhaler.  #Myasthenia Gravis--s/p plasmapheresis x5 October 2020, no Thymoma, on Azathioprine 150 daily and mestinon 60 tid.??????Saw???Dr. Enedina Finner at [facility]. ???Was considering Soliris.??????Ordering IVIG and MRI.  Encourage ongoing neurology follow up.   #IBS--titrate miralax to comfort. ???Consider duloxetine substitute for buproprion.  #Interstitial cystitis--on daily Trimethoprim 100 for UTI prophylaxis. ???Using bladder instillations prn. OFF topical estrogen due to advice from neurologist re: MG.  To see Dr. Desma Maxim to see if she has additional insights.  #Major Depression--active but appears better s/p  increase Duloxetine to 40 mg daily. Referred patient to Dr. Randie Heinz previously.  #Allergic rhinitis--previously on nasal steroids.  #Breast CA--DCIS???by report--obtain mammo. ???Patient declined surgery in 2019 and experienced dry mouth from Tamoxifen. ???Was considering Anastrozole. Await records from prior mammogram and treatment.  Per records from [facility]:   ''07/20/2017 Screening detected right breast calcifications and distortion. The distortion was in the upper outer quadrant: Biopsy DCIS intermediate grade with CSL; calcifications UIQ: 2.8 cm: Biopsy DCIS+ ALH +CSL; 4 cm apart, axilla negative, Tis NX stage 0''  Due for repeat mammo Apirl 2022  #DM with CKD--stabe 3b--based on labs from [name]. ???FOllow here. ???If BP permits, may consider ARB BUT patient with h/o angioedema so will ONLY initiate this agent with great caution.  Refer to Ophthalmology.  #OA knees--previously getting hyaluronic acid injections.  #Anxiety  #Benzodiazepine dependence--back to 1.5 mg daily due to recent stress  #Fibromyalgia--duloxetine as above. 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  #Statin intolerance      FOLLOW-UP     RTC     Future Appointments   Date Time Provider Department Center   05/18/2020 11:40 AM York Ram., MD GERI IMS 420 MEDICINE   06/13/2020  2:30 PM MP2 MAM PANEL 01-RADIOLOGY MAM MP2 [place] [facility]   08/16/2020 11:30 AM NEU MG CLINIC [facility] B200 NEUROLOGY         The above plan of care, diagnosis, orders, and follow-up were discussed with the patient and/or surrogate. Questions related to this recommended plan of care were answered.    ANALYSIS OF DATA (Needs to meet 1 category for moderate and 2 categories for high LOS)     I have:     Category 1 (Needs 3 for moderate and high LOS)     []  Reviewed []  1 []  2 []  ? 3 unique laboratory, radiology, and/or diagnostic tests noted below    Test/Study:  on date .    []  Reviewed []  1 []  2 []  ? 3 prior external notes and incorporated into patient assessment    I reviewed Dr. 's note in specialty  from date .    []   Discussed management or test interpretation with external provider(s) as noted      []  Ordered []  1 []  2 []  ? 3 unique laboratory, radiology, and/or diagnostic tests noted in A&P    []  Obtained history from independent historian:       Category 2  []  Independently interpreted the test    Category 3  []  Discussed management or test interpretation with external provider(s) as noted      INTERACTION COMPLEXITY and SOCIAL DETERMINANTS of HEALTH       PROBLEM COMPLEXITY   []  New problem with uncertain diagnosis or prognosis (moderate)   []  Multiple stable chronic problems (moderate)   []  Chronic problem not stable - not controlled, symptomatic, or worsening (moderate)   []  Severe exacerbation of chronic problem (high)   []  New or chronic problem that poses threat to life or bodily function (high)     MANAGEMENT COMPLEXITY   []  Old or external/outside records reviewed   []  Discussion with alternate (proxy) if patient with impaired communication / comprehension ability (e.g., dementia, aphasia, severe hearing loss).   []  Repeated questions (or disagreement) between patient and/among caregivers/family during the visit.  []  Caregiver/patient emotions/behavior/beliefs interfering with implementation of treatment plan.  []  Independent interpretation of test (EKG, Chest XRay)   []  Discussion of case with a consultant physician     RISK LEVEL   []  Prescription drug management (moderate)   []  Minor surgery with CV risk factors or elective major surgery (moderate)   []  Dx or Rx significantly limited by SDoH (inadequate housing, living alone, poor health care access, inappropriate diet; low literacy) (moderate)   []  Major surgery - elective with CV risk factors or emergent (high)   []  Need for hospitalization (high)   []  New DNR or de-escalation of care (high)      SDoH  The diagnosis or treatment of said conditions is significantly limited by the following social determinants of health:  []  Z59.0 Homelessness  []  Z59.1 Inadequate housing  []  Z59.2 Discord with neighbors, lodgers and landlord  []  Z60.2 Problems related to living alone  []  Z59.8 Other problems related to housing and economic circumstances  []  Z59.4 Lack of adequate food and safe drinking water  []  Z59.6 Low income  []  Z59.7 Insufficient social insurance and welfare support  []  Z59.9 Problems related to housing and economic circumstances, unspecified  []  Z75.3 Unavailability and inaccessibility of health care facilities  []  Z75.4 Unavailability and inaccessibility of other helping agencies  []  Z72.4 Inappropriate diet and eating habits  []  Z62.820 Parent-biological child conflict  []  Z63.8 Other specified problems related to primary support group  []  Z55.0 Illiteracy and low level literacy   []  Z56.9 Unspecified problems related to employment        If Billing Based on Time:     I performed the following items on the day of service:    [x]  Preparing to see the patient (e.g., review of tests)  [x]  Obtaining and/or reviewing separately obtained history   [x]  Performing a medically appropriate examination and/or evaluation   [x]  Counseling and educating the patient/family/caregiver   [x]  Ordering medications, tests, or procedures  [x]  Referring and communicating with other healthcare professionals (when not separately reported)  [x]  Documenting clinical information in the EHR  []  Independently interpreting results and communicating results to patient/family/caregiver    I spent the following total amount of time on these tasks on the day of service:  New Patient     Established  Patient  []  15-29 minutes ??? [id number]    []  up to 9 minutes ??? [id number]  []  30-44 minutes ??? [id number]     []  10-19 minutes ??? [id number]   []  45-59 minutes ??? [id number]      []  20-29 minutes ??? [id number]   []  60-74 minutes ??? [id number]   []  30-39 minutes ??? [id number]         [x]  40-55 minutes ??? [id number]    []  I spent an additional ** * 15-minute-increment(s) for a total of * ** minutes on these tasks on the day of service. [phone] for each additional 15 minutes.)    Author: York Ram, MD 05/18/2020

## 2020-05-18 ENCOUNTER — Ambulatory Visit: Payer: MEDICARE | Attending: Geriatric Medicine

## 2020-05-18 DIAGNOSIS — N1831 Stage 3a chronic kidney disease (HCC/RAF): Secondary | ICD-10-CM

## 2020-05-18 MED ORDER — CLONAZEPAM 1 MG PO TABS: Start: 2020-05-18 — End: ?

## 2020-05-18 NOTE — Patient Instructions
Check out the [place]    [email or web address]

## 2020-05-23 ENCOUNTER — Telehealth: Payer: MEDICARE

## 2020-05-23 NOTE — Telephone Encounter
Returned pt's call and left vm

## 2020-05-27 ENCOUNTER — Telehealth: Payer: BLUE CROSS/BLUE SHIELD

## 2020-05-27 DIAGNOSIS — N3000 Acute cystitis without hematuria: Secondary | ICD-10-CM

## 2020-05-27 NOTE — Telephone Encounter
Outpatient Geriatric Telephone Visit     Patient: [name]  MRN: [id number]  PCP: Molinda Bailiff., MD  Date: 05/27/2020      Patient's concerns:  Patient with back pain.  Daughter wondering about UTI.  No fever, no change UI.  Mild dysuria but this is chronic and previously associated with interstitial cystitis.      Assessment and Plan:  Back pain  Problems:  Back pain    Plan:  Doubt UTI--check UA and C and S.  No empiric abx.        # advice given about precautions for coronavirus    Future Appointments   Date Time Provider [place]   06/13/2020  2:30 PM MP2 MAM PANEL 01-RADIOLOGY MAM MP2 [place] [facility]   07/20/2020 11:20 AM Molinda Bailiff., MD GERI IMS 420 MEDICINE   08/16/2020 11:30 AM NEU MG CLINIC [place]         Total time spent on this telephone encounter:  []   5-10 minutes (45409)  []   11-20 minutes (81191)  []   >21 minutes (47829)      [All the following must be checked off in order to bill for this service.]    []   This service is not related to a problem addressed by an E/M encounter in the prior 7 days  []   This service will not result in an office or telemedicine visit within the next 24 hours or soonest available appointment.   []   This service was initiated by the patient.   []   This patient is an established patient in my practice.     The patient has been notified that this visit will result in an E/M charge.    Signed: Molinda Bailiff, MD

## 2020-05-30 MED ORDER — AZATHIOPRINE 50 MG PO TABS
ORAL_TABLET | 2 refills
Start: 2020-05-30 — End: ?

## 2020-05-31 MED ORDER — AZATHIOPRINE 50 MG PO TABS
ORAL_TABLET | 2 refills
Start: 2020-05-31 — End: ?

## 2020-05-31 MED ORDER — TRIMETHOPRIM 100 MG PO TABS
100 mg | ORAL_TABLET | Freq: Every day | ORAL | 3 refills | Status: AC
Start: 2020-05-31 — End: ?

## 2020-06-08 ENCOUNTER — Telehealth: Payer: BLUE CROSS/BLUE SHIELD

## 2020-06-08 DIAGNOSIS — N3000 Acute cystitis without hematuria: Secondary | ICD-10-CM

## 2020-06-08 NOTE — Telephone Encounter
Please advise  Pt requesting Macrobid 100mg  to pharmacy listed

## 2020-06-08 NOTE — Telephone Encounter
Refill Request    Verified patient was seen within the last 6 months. If not seen within 6 months, offered appointment.     Collected prescription information from patient.  Per pharmacist, patient is requesting macrobid 100mg . Pharmacist mentioned that the patient called [place] earlier this morning for this request.      Pended orders for provider to review and sign.     Pharmacy location was confirmed and class was set for pended orders.  [address], [place], [place].     Patient has been notified of the 24-48 hour turnaround time.

## 2020-06-08 NOTE — Telephone Encounter
Outpatient Geriatric Telephone Visit     Patient: [name]  MRN: [id number]  PCP: Molinda Bailiff., MD  Date: 06/08/2020      Patient's concerns:    Still feels like she has UTI  Slipped and scraped face.    Given current eGFR, will hold off on empiric macrobid  Assessment and Plan:  dysutria  Problems:  dysuria    Plan:      Empiric macrobid if eGFR is >40    # advice given about precautions for coronavirus    Future Appointments   Date Time Provider [place]   06/13/2020  2:30 PM MP2 MAM PANEL 01-RADIOLOGY MAM MP2 [place] [facility]   07/20/2020 11:20 AM Molinda Bailiff., MD GERI IMS 420 MEDICINE   08/16/2020 11:30 AM NEU [facility] CLINIC [facility] [place]         Total time spent on this telephone encounter:  []   5-10 minutes (99441)  []   11-20 minutes (28768)  [x]   >21 minutes (11572)      [All the following must be checked off in order to bill for this service.]    []   This service is not related to a problem addressed by an E/M encounter in the prior 7 days  []   This service will not result in an office or telemedicine visit within the next 24 hours or soonest available appointment.   []   This service was initiated by the patient.   []   This patient is an established patient in my practice.     The patient has been notified that this visit will result in an E/M charge.    Signed: Molinda Bailiff, MD

## 2020-06-09 ENCOUNTER — Telehealth: Payer: BLUE CROSS/BLUE SHIELD

## 2020-06-10 MED ORDER — AMOXICILLIN 500 MG PO CAPS
500 mg | ORAL_CAPSULE | Freq: Two times a day (BID) | ORAL | 0 refills | Status: AC
Start: 2020-06-10 — End: ?

## 2020-06-13 ENCOUNTER — Telehealth: Payer: BLUE CROSS/BLUE SHIELD

## 2020-06-13 NOTE — Telephone Encounter
asx from urine  Will hold off on iv abx  cipro relatively contra-indicated due to MG

## 2020-06-14 NOTE — Telephone Encounter
Reply by: Osualdo Hansell Valencia Garrie Elenes      Orders sent.     Gerik Coberly

## 2020-06-14 NOTE — Telephone Encounter
Hi [place]  Let's order the following:    Ceftazadime 2 Grams iv q 24 hours for 7 days.  Electronically signed  Midge Aver, MD      Please have them recheck a CBC and BMP on day 4 of treatment.

## 2020-06-16 ENCOUNTER — Ambulatory Visit: Payer: BLUE CROSS/BLUE SHIELD

## 2020-06-17 ENCOUNTER — Ambulatory Visit: Payer: BLUE CROSS/BLUE SHIELD

## 2020-06-17 DIAGNOSIS — N3000 Acute cystitis without hematuria: Secondary | ICD-10-CM

## 2020-06-23 ENCOUNTER — Telehealth: Payer: MEDICARE

## 2020-06-23 DIAGNOSIS — N3000 Acute cystitis without hematuria: Secondary | ICD-10-CM

## 2020-06-23 NOTE — Telephone Encounter
Hi [place]  Please call the patient.  Let her know that there is some bacteria still in the bladder.  I'd like her to see one of our colleagues in [place].  Please help with making arrangements.

## 2020-06-24 ENCOUNTER — Telehealth: Payer: BLUE CROSS/BLUE SHIELD

## 2020-06-24 NOTE — Telephone Encounter
Reply by: Tracey Stewart Valencia Catalina Salasar    Called and advised patient who voiced understanding.     Venetta Knee

## 2020-06-24 NOTE — Telephone Encounter
Appointment Accommodation Request      Appointment Type: New     Reason for sooner request: Pt was referred by Dr. Mindi Slicker for Acute cystitis without hematuria.     Pt would like to know if she can be seen sooner than June.     Date/Time Requested (If any): Soonest available.     Last seen by MD: New Pt.     Any Symptoms:  []  Yes  [x]  No       If yes, what symptoms are you experiencing:   o Duration of symptoms (how long):     Patient or caller was offered an appointment but declined.    Patient or caller was advised to seek emergency services if conditions are urgent or emergent.    Patient has been notified of the 24-48 hour turnaround time.

## 2020-06-27 ENCOUNTER — Ambulatory Visit: Payer: BLUE CROSS/BLUE SHIELD

## 2020-06-28 NOTE — Telephone Encounter
Hi [name],  Might you please reach out to ID to see what can be done?  You might need to escalate this

## 2020-06-28 NOTE — Telephone Encounter
Hi [name]  Can you assist?  It's not ''urgent'' but a month out feels like a bit too long to wait.

## 2020-06-30 ENCOUNTER — Telehealth: Payer: BLUE CROSS/BLUE SHIELD

## 2020-06-30 ENCOUNTER — Telehealth: Payer: Commercial Managed Care - Pharmacy Benefit Manager

## 2020-06-30 NOTE — Telephone Encounter
Spoke with pt and scheduled her for 06/06.

## 2020-06-30 NOTE — Telephone Encounter
Message to Practice/Provider      Message: Pt called in regards to getting an earlier appointment with ID, refer to 06/27/20 encounter. Per office they called ID and June would be the soonest, pt verbalized understanding and will schedule. Thank you     Return call is not being requested by the patient or caller.    Patient or caller has been notified of the 24-48 hour processing turnaround time if applicable.

## 2020-06-30 NOTE — Telephone Encounter
Appointment Accommodation Request      Appointment Type: new     Reason for sooner request: Pt stated that her doctor wants her to be seen as soon as possible for Acute cystitis without hematuria. Pt would like like a call back at [phone] and leave a message if she doesn't answer.     Date/Time Requested (If any): open to any day and time except 5/18.    Last seen by MD: none    Any Symptoms:  []  Yes  [x]  No       If yes, what symptoms are you experiencing:   o Duration of symptoms (how long):     Patient or caller was offered an appointment but declined.    Patient or caller was advised to seek emergency services if conditions are urgent or emergent.    Patient has been notified of the 24-48 hour turnaround time.    '

## 2020-07-01 ENCOUNTER — Telehealth: Payer: BLUE CROSS/BLUE SHIELD

## 2020-07-01 ENCOUNTER — Ambulatory Visit: Payer: BLUE CROSS/BLUE SHIELD

## 2020-07-02 NOTE — Telephone Encounter
Spoke with patinet.  Still with dysuria.  Unable to get ID visit for a month.  Given pseudomonas and given inability to take cipro due to Myesthenia, will refer to [facility] for iv or im ceftazadime and (assuming she tolerates this) one week of additional ceftaz  .

## 2020-07-04 DIAGNOSIS — N390 Urinary tract infection, site not specified: Secondary | ICD-10-CM

## 2020-07-04 DIAGNOSIS — D849 Immunodeficiency, unspecified: Secondary | ICD-10-CM

## 2020-07-04 DIAGNOSIS — G7000 Myasthenia gravis without (acute) exacerbation: Secondary | ICD-10-CM

## 2020-07-04 NOTE — Telephone Encounter
Hi [name]  Might you ask your colleague again in ID?  Patient is immunocompromised, has a resistant pseudomonas bug in her urine, and can't take oral meds due to her myesthenia gravis.  This is a bit more complicated than it sounds!

## 2020-07-05 NOTE — Addendum Note
Addended by: Zannie Kehr on: 07/04/2020 05:39 PM     Modules accepted: Orders

## 2020-07-12 ENCOUNTER — Ambulatory Visit: Payer: BLUE CROSS/BLUE SHIELD

## 2020-07-12 DIAGNOSIS — R300 Dysuria: Secondary | ICD-10-CM

## 2020-07-12 DIAGNOSIS — A498 Other bacterial infections of unspecified site: Secondary | ICD-10-CM

## 2020-07-13 ENCOUNTER — Telehealth: Payer: MEDICARE

## 2020-07-13 MED ADMIN — MEROPENEM-SODIUM CHLORIDE 1 GM/50ML IV SOLR: 1 g | INTRAVENOUS | @ 04:00:00 | Stop: 2020-07-13 | NDC 00264318511

## 2020-07-13 NOTE — Progress Notes
[facility]  [address]  [address]  [place] [address]  Phone: [phone]  FAX: [phone]    URGENT CARE    Subjective:     CC: Urinary symptoms for 1 month    HPI: [name] is a 85 y.o. female who presents with: dysuria, flank pain bilaterally and bladder pain.  Pertinent negatives include:  chills, lower abdominal pain, nausea, vomiting, diarrhea and fever.  Of note, she reports Patient has recurrent UTIs and has been on ciprofloxacin and macrobid previously..    Urine culture on 06/09/20 and 06/19/20 were positive for Pseudomoas. Patient has not started treatment because of her myasthenia gravis. She previously took ciprofloxacin and macrobid but cannot take ciprofloxacin because of her MG    Has appointment for breast biopsies tomorrow.    07/01/20 Encounter with PCP - Dr. Brayton El:  Patient still with dysuria.  Unable to get ID visit for a month.  Given pseudomonas and given inability to take cipro due to Myasthenia, will refer to [facility] for iv or im ceftazadime and (assuming she tolerates this) one week of additional ceftaz    Medications  No outpatient medications have been marked as taking for the 07/12/20 encounter (Appointment) with Yolande Jolly, MD.     Review of Systems  The following review of systems completed and were negative except as described above: Constitutional, Respiratory, GI and GU    Objective:     Physical Exam  BP 177/78  ~ Pulse 83  ~ Temp 36.4 ???C (97.5 ???F) (Tympanic)  ~ Resp 18  ~ SpO2 96%   General: alert, well appearing, and in no distress  CVS: Regular rate and rhythm, normal S1/S2, no murmurs, normal pulses and capillary fill  Lungs: clear to auscultation, no wheezes, rales, or rhonchi, no tachypnea, retractions, or cyanosis  Abdomen: Abdomen is soft with slight lower abdominal tenderness, masses, organomegaly or guarding.  MSK: no CVA tenderness, normal gait  Skin: no rash, no lesions.    Assessment:     [name] is a 85 y.o. female who presents with uncomplicated, acute cystitis.    Plan:     Diagnoses and all orders for this visit:    Pseudomonas infection  Dysuria  -     meropenem 1 g in sodium chloride 0.9% 50 mL IVPB MB  Patient with 1 month history of dysuria. Previously taking macrobid and ciprofloxacin but advised not to take ciprofloxacin now due to concerns regarding worsening of myasthenia gravis. Referred by PCP Dr. Brayton El for IV or IM injection of antibiotics. Meropenem available in clinic today. Given Meropenem IV push and advised that Meropenem is to be given 1g every 12 hours due to impaired renal function (Creatinine 1.63, GFR 29).   Would benefit from [facility] and discussion with PCP on options to continue Meropenem.    The patient was advised to call or return should symptoms should she develop fevers, new back pain, persistent vomiting, or no improvement 48 hours after starting antibiotics.    The above plan of care, diagnosis, orders, and follow-up were discussed with the patient.  Questions related to this recommended plan of care were answered.    Scribe Signature:  I, Rosita Fire, have scribed for Dr. Jarrett Ables with the documentation for Jaimya Wanko in [facility] in [place] on 07/12/2020 at 8:08 PM.    Physician Signature:   I have reviewed this note, scribed by Rosita Fire and attest that it  is an accurate representation of my H & P and other events of the outpatient visit except if otherwise noted.  Yolande Jolly, MD  07/12/2020 8:08 PM       Clinton Quant  07/12/2020 at 7:47 PM

## 2020-07-13 NOTE — Telephone Encounter
Reply by: [name]        Orders sent. Will follow up with ID.     [place]

## 2020-07-13 NOTE — Telephone Encounter
Pt has an urgent referral on file. Confirmed and scheduled w/ Dr. Milus Glazier on 05/20 @ 4:00pm, [employer], in-person.     No further action for this encounter.

## 2020-07-13 NOTE — Telephone Encounter
Hi [place]  Please order meropenum 1 gram iv q 12 hours for 7 days (14 doses).  Please fax to her infusion provider.  Electronically signed  Midge Aver, MD

## 2020-07-13 NOTE — Nursing Note
Pt alert and oriented X4, ambulating independently; steady gait         5 /17  /22   @2033      --#24 gauge   inserted on first attempt into left wrist    .    Infusion ordered - Normal saline X 1036ml X 47mins  Start: 2033  Stop: 2110    Pt alert and oriented X4, ambulating independently; steady gait     Patient tolerated infusion     IV D/C, site clean dry and intact, no signs and symptoms of complications. PT denies any issues.       Pt discharged.  Dmora RN

## 2020-07-13 NOTE — Telephone Encounter
PDL Call to Practice    Reason for Call: Pt is returning phone call.    Appointment Related?  []  Yes  [x]  No     If yes;  Date:  Time:    Call warm transferred to PDL: [x]  Yes  []  No    Call Received by Practice Representative: Delcie Roch

## 2020-07-14 ENCOUNTER — Telehealth: Payer: BLUE CROSS/BLUE SHIELD

## 2020-07-14 ENCOUNTER — Ambulatory Visit: Payer: MEDICARE

## 2020-07-14 ENCOUNTER — Ambulatory Visit: Payer: Commercial Managed Care - Pharmacy Benefit Manager

## 2020-07-14 ENCOUNTER — Ambulatory Visit: Payer: BLUE CROSS/BLUE SHIELD

## 2020-07-14 ENCOUNTER — Telehealth: Payer: Commercial Managed Care - Pharmacy Benefit Manager

## 2020-07-14 DIAGNOSIS — N390 Urinary tract infection, site not specified: Secondary | ICD-10-CM

## 2020-07-14 DIAGNOSIS — N1831 Stage 3a chronic kidney disease (HCC/RAF): Secondary | ICD-10-CM

## 2020-07-14 DIAGNOSIS — B965 Pseudomonas (aeruginosa) (mallei) (pseudomallei) as the cause of diseases classified elsewhere: Secondary | ICD-10-CM

## 2020-07-14 DIAGNOSIS — G7000 Myasthenia gravis without (acute) exacerbation: Secondary | ICD-10-CM

## 2020-07-14 MED ORDER — ASCORBIC ACID 500 MG PO TABS
500 mg | ORAL_TABLET | Freq: Two times a day (BID) | ORAL | 11 refills | Status: AC
Start: 2020-07-14 — End: ?

## 2020-07-14 MED ORDER — METHENAMINE HIPPURATE 1 G PO TABS
1 g | ORAL_TABLET | Freq: Two times a day (BID) | ORAL | 5 refills | Status: AC
Start: 2020-07-14 — End: ?

## 2020-07-14 NOTE — Patient Instructions
1) Will plan for a course of meropenem to treat Pseudomonas, this will be via peripheral IV for 7 days    2) Hold the trimethoprim     3) Consider topical vaginal estrogen with either premarin or estrace cream if not contraindicated    4) Stay well hydrated, try to drink 1.5L per day    5) Start vitamin C 500 mg twice per day + methenamine 1000 mg twice per day     6) Need to see urology to see if there is an anatomical component contributing to your symptoms

## 2020-07-14 NOTE — Progress Notes
OUTPATIENT TRANSPLANT INFECTIOUS DISEASES CONSULTATION    Patient: [name]  MRN: [id number]  DOB: 09-Mar-1935  Date of Service: 07/14/2020  Requesting Physician:  York Ram., MD  [address], [address]  [place],  [place] [address]     Reason for Consultation: Pseudomonal bacteriuria, c/f cystitis    Chief Complaint:     History of Present Illness:   [name] is a 85 y.o. old female with myasthenia gravis on azathioprine +IVIG , COPD, hx interstitial cystitis and IBS, previously evaluated by gynecolology for pelvic pain; is referred to ID because of persistent Pseudomonal bacteriuria.     From review of records, concerns first brought up by pt's daughter on 05/27/20 as patient was experiencing back pain. No other apparent UTI sx but did have mild, chronic dysuria which was long-standing. Pt called back ~ 2 weeks later reporting dysuria with urine culture demonstrating50K  Pseudomonas aeruginosa (pan-S); not treated given absence of clear symptoms initially, but given ongoing dysuria ultimately treated with course of meropenem 1 gm IV q12h via home health. Course started 5/17    07/14/20 Pt reports that she had experienced dysuria first starting ~ 30 years ago. Had been diagnosed with interstitial cystitis presumptively and was managed with weekly bladder irrigations; eventually underwent a rectocele repair ~ 20 years ago (per pt report, however notes mention this was ~ 2 years ago) and had noted some improvement in symptoms until worsening ~ October 2020 after admission for myasthenia     Moved to [place] to be with daughter in November 2020. Was seen by uro/gynecology Dr. Lorenz Coaster on 01/06/19 for dysuria sx. Cystoscopy without evidence of inflammation, had c/f overactive bladder with urinary urge incontinence, as well as bladder neck obstruction. Was referred to Dr. Desma Maxim and seen on     Feels that dysuria is worse over the past month, with increased frequency and urgency every ~ 30 minutes but does not appear to be worse than baseline, as well as some hesitancy. Some suprapubic discomfort, long-standing. Feels that symptoms are worse with setting. No fevers or rigors; notes bilateral lower back pain.     Single dose of meropenem received on 5/17    For UTI prophylaxis is currently on trimethoprim; not on any other medications      Past Medical History:  No past medical history on file.   Myasthenia gravis - on IVIG and azathiopine ~ 4-5 years  Hx rectocele      Past Surgical History:  No past surgical history on file.    Immunizations:   Immunization History   Administered Date(s) Administered   ? COVID-19, mRNA, (Pfizer - Purple Cap) 30 mcg/0.3 mL 03/11/2019, 04/01/2019, 10/29/2019   ? DTaP 12/31/2015   ? influenza vaccine IM quadrivalent (Fluzone Quad) MDV (71 months of age and older) 11/26/2016   ? influenza vaccine IM quadrivalent adjuvanted (FluAD Quad) (PF) SYR (49 years of age and older) 11/05/2018, 12/01/2019   ? influenza, unspecified formulation 04/01/2019, 04/01/2019, 12/01/2019, 12/01/2019, 12/01/2019, 12/01/2019   ? pneumococcal polysaccharide vaccine 23-valent (Pneumovax) 01/10/2019       Allergies:   Allergies   Allergen Reactions   ? Beta Adrenergic Blockers Other (See Comments)     Avoid d/t Myasthenia Gravis  Avoid d/t Myasthenia Gravis     ? Levofloxacin Other (See Comments)     Flare of Myasthenia Gravis   ? Macrolides And Ketolides Other (See Comments)     Avoid d/t Myasthenia Gravis  Use with  caution d/t Myasthenia Gravis  Use with caution d/t Myasthenia Gravis     ? Sulfa Antibiotics Other (See Comments)     May possibly have caused deafness in one ear  May possibly have caused deafness in one ear  other     ? Botulinum Toxins Other (See Comments)     Muscle weakness r/t Myasthenia Gravis  Muscle weakness r/t Myasthenia Gravis     ? Statins Other (See Comments)     Muscle aches  Muscle aches  Muscle aches     ? Plastic Tape Itching and Other (See Comments)     Turns the skin red where applied       Medications:  Current Outpatient Medications   Medication Sig   ? Albuterol Sulfate AEPB Takes 1 to 2 puffs every 4 hours as needed for wheezing or SOB Inhale Takes 1 to 2 puffs every 4 hours as needed for wheezing or SOB ..   ? azaTHIOprine 50 mg tablet Take one and one-half tablet (75 mg total) by mouth two (2) times daily.   ? buPROPion, SR, (WELLBUTRIN SR) 200 mg 12 hr tablet Take 1 tablet (200 mg total) by mouth two (2) times daily.   ? clonazePAM 1 mg tablet 0.5 mg in am and 1 mg in pm.   ? DEXILANT 60 MG DR capsule    ? DULoxetine 20 mg DR capsule Take 2 capsules (40 mg total) by mouth daily.   ? fluocinonide 0.05% external solution    ? fluticasone 50 mcg/act nasal spray Spray 1 spray by nasal route daily.   ? fluticasone-vilanterol (BREO ELLIPTA) 100-25 mcg/inh inhaler Inhale 1 puff daily.   ? GAMMAGARD 30 GM/300ML SOLN every twenty one (21) days .   ? losartan 25 mg tablet Take 1 tablet (25 mg total) by mouth daily.   ? montelukast 10 mg tablet Take 10 mg by mouth as needed for.   ? pyridostigmine 60 mg tablet Take 1 tablet (60 mg total) by mouth three (3) times daily as needed.   ? trimethoprim 100 mg tablet Take 1 tablet (100 mg total) by mouth daily.     No current facility-administered medications for this visit.     Facility-Administered Medications Ordered in Other Visits   Medication Dose Route Frequency   ? lidocaine 2% inj 5 mL  5 mL Intradermal Once   ? lidocaine 2% inj 5 mL  5 mL Intradermal Once   ? lidocaine-EPINEPHrine 1 %-1:100000 inj 4.8 mL  4.8 mL Intradermal Once   ? lidocaine-EPINEPHrine 1 %-1:100000 inj 5 mL  5 mL Intradermal Once       Family History:  No family history on file.  No relevant family history of infectious diseases.  Birth mother had hx breast ca    Social History:  Social History     Socioeconomic History   ? Marital status: Widowed   Tobacco Use   ? Smoking status: Former Smoker     Quit date: 1985     Years since quitting: 37.4   ? Smokeless tobacco: Former Estate agent and Sexual Activity   ??? Alcohol use: Not Currently   ??? Drug use: Never     Lived in [name] long-term; moved to [place] to be closer to daughter in ~ November 2020    Review of Systems:  A 14-point review of systems was performed and is negative except for as noted above.    Physical Exam:  Blood pressure  165/79, pulse 81, temperature 36.6 ???C (97.9 ???F), temperature source Forehead, resp. rate 18, height 5' 5'' (1.651 m), weight 147 lb 1.6 oz (66.7 kg), SpO2 96 %.  General: no acute distress, pleasant  Eyes: anicteric sclerae, no injection, extraocular movements are intact  ENT: no oropharyngeal lesions, mmm, normal dentition  CV: Regular rate and rhythm.   Pulm: Normal effort.   Musculoskeletal: No edema.   Skin: no visible rash, normal turgor  Neuro: Awake and alert, moving all 4 extremities. Grossly nonfocal exam.   Psych: Appropriate mood and affect. Normal judgment and insight.    Laboratory Data:   Lab Results   Component Value Date    WBC 6.10 03/23/2020    HGB 12.7 03/23/2020    HCT 38.0 03/23/2020    MCV 97.9 03/23/2020    PLT 418 (H) 03/23/2020     Lab Results   Component Value Date    CREAT 1.63 (H) 06/09/2020    BUN 23 (H) 06/09/2020    NA 136 06/09/2020    K 4.0 06/09/2020    CL 102 06/09/2020    CO2 22 06/09/2020     No results found for: TACROLIMUS  Lab Results   Component Value Date    ALT 20 03/23/2020    AST 28 03/23/2020    ALKPHOS 80 03/23/2020    BILITOT 0.3 03/23/2020     Lab Results   Component Value Date    HGBA1C 5.2 03/23/2020      No results found for: CMVQPCR  No results found for: CMVQPCRALPHA  No results found for: EBVQPCR  No results found for: [facility]     Lab Results   Component Value Date    MTBQFNGOLD Negative 12/17/2018          06/09/2020 13:59 06/19/2020 13:19   Urine Color Yellow Yellow   pH,Urine 5.5 5.5   Specific Gravity 1.029 1.021   Blood,Dipstick Negative Negative   Bili,Dipstick Negative Negative   Glucose,Random Urine Negative Negative   Ketones Trace (A) Negative   Protein 1+ (A) Trace (A)   Nitrite Negative Negative   Leukocyte Esterase 2+ (A) 2+ (A)   RBC per uL 7 11   WBC per uL 417 (H) 117 (H)   RBC per HPF 1 2   WBC per HPF 83 (H) 23 (H)   Bacteria Present (A) Present (A)   Squamous Epi Cells 6 6   Hyaline Casts >20/LPF (A) >20/LPF (A)   Ca Ox Crystal Present (A)        Microbiology:   4/24 Urine cx 70K CFU Pseudomonas aeruginosa (S cipro, mero <0.25, cefepime =8, gent, ceftaz; I pip-tazo 32)    06/09/20 Urine cx 50K Pseudomonas aeruginosa (S cefepime, cipro, pip-tazo, gent)    12/31/18 Urine cx ESBL Kleb pneumo S erta, gent)      Imaging Reviewed by Me:   09/22/19 CXR  No acute findings or abnormalities related to provided history.        Assessment:   Ladestiny Tschetter is a 85 y.o. 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; long-standing since teenager    # CrCl ~ 27 based on Cr 1.63    Plan:     - Based on susceptibilities from 4/24 urine cx isolate, plan for meropenem 1 gm IV q12h x 7 days for Pseudomonal bacteriuria with c/f cystitis   - Would monitor to see if there is any impact on symptoms; if none worthwhile to investigate for other potential contributors to dysuria and urinary frequency    - Cipro/levofloxacin contraindicated with MG,, aminoglocyside contraindicated with baseline hearing loss    - Do not suspect that entirety of urinary complaints are related to UTI; there is likely a component of either functional ie overactive bladder or anatomical (ie bladder outlet obstruction, recurrent prolapse/rectocele) contributing to her symptoms which are more chronic   --May also have component of atrophic vaginitis     - In future would avoid routine UA/urine cultures in absence of changes in symptoms or prior to urological procedures to try and avoid detection of asymptomatic bacteriuria which may confound clinical management and lead to antibiotic overexposure    - UTI prophylaxis recommendations:  1) Hold the trimethoprim     2) Consider topical vaginal estrogen with either premarin or estrace cream if not contraindicated    3) Stay well hydrated, try to drink 1.5L per day    4) Start vitamin C 500 mg twice per day + methenamine 1000 mg twice per day     5) Need to see urology to see if there is an anatomical component contributing to your symptoms    Return in about 4 weeks (around 08/11/2020).    Author:  Docia Chuck, MD     Infectious Diseases   07/14/2020 7:33 AM    BILLING ADDENDUM (TIME):  On the day of service I spent 65 minutes for the items checked below:  [x]  Preparing to see the patient (e.g., review of tests)  [x]  Obtaining and/or reviewing separately obtained history  [x]  Performing a medically appropriate examination and/or evaluation  [x]  Counseling and educating the patient/family/caregiver  [x]  Ordering medications, tests, or procedures  [x]  Referring and communicating with other healthcare professionals (when not separately reported)  [x]  Documenting clinical information in the EHR  [x]  Independently interpreting results and communicating results to patient/family/caregiver

## 2020-07-14 NOTE — Telephone Encounter
PDL Call to Practice    Reason for Call:  Patient needs to schedule a return visit with Dr. Rodney Cruise   Appointment Related?  [x]  Yes  []  No     If yes;  Date: TBD by clinic   Time: TBD by clinic     Call warm transferred to PDL: [x]  Yes  []  No    Call Received by Practice Representative:  Fortino Sic

## 2020-07-15 ENCOUNTER — Telehealth: Payer: Commercial Managed Care - Pharmacy Benefit Manager

## 2020-07-15 ENCOUNTER — Ambulatory Visit: Payer: BLUE CROSS/BLUE SHIELD

## 2020-07-15 NOTE — Telephone Encounter
Per Doroteo Bradford with [facility] at  [phone] pt does not help family changing every 12 hours ( IV bag) if Dr Mindi Slicker has a suggestion or changing medication to be changed every 24 hrs instead so that we can provide/send the nurse to assist the pt.  Please advise

## 2020-07-15 NOTE — Telephone Encounter
Message to Practice/Provider      Message: Threasa Beards from [employer] calling to request the clinical notes for meropenum 1 gram iv q 12 hours for 7 days (14 doses).    Ph. [phone]  Fax: [phone]    Return call is not being requested by the patient or caller.    Patient or caller has been notified of the 24-48 hour processing turnaround time if applicable.

## 2020-07-15 NOTE — Telephone Encounter
Call Back Request      Reason for call back: Anabel from [facility] called and needs clarification regarding the weekly blood draws, she states the therapy is only for 7 days so she wants to know when the end date is for the lab draw order.    She also would like the lab order to be faxed over please.    T: [phone]  F: [phone]    Any Symptoms:  []  Yes  [x]  No       If yes, what symptoms are you experiencing:    o Duration of symptoms (how long):    o Have you taken medication for symptoms (OTC or Rx):      Patient or caller has been notified of the 24-48 hour turnaround time.

## 2020-07-15 NOTE — Telephone Encounter
Reply by: [name]        Progress notes faxed.     [place]

## 2020-07-16 ENCOUNTER — Telehealth: Payer: BLUE CROSS/BLUE SHIELD

## 2020-07-16 DIAGNOSIS — C50919 Malignant neoplasm of unspecified site of unspecified female breast: Secondary | ICD-10-CM

## 2020-07-16 NOTE — Telephone Encounter
Discussed breast bx findings and will arrange for consultation with breast clinic

## 2020-07-19 ENCOUNTER — Telehealth: Payer: BLUE CROSS/BLUE SHIELD

## 2020-07-19 NOTE — Telephone Encounter
Call Back Request      Reason for call back:     Pt daughter advised her mother made the appointment on her granddaughters graduation date and would like to know if they can be rescheduled. Please advise.    Cb: [phone]     Any Symptoms:  [x]  Yes  []  No       If yes, what symptoms are you experiencing:   Cancer   o Duration of symptoms (how long):    o Have you taken medication for symptoms (OTC or Rx):      Patient or caller has been notified of the 24-48 hour turnaround time.

## 2020-07-20 ENCOUNTER — Ambulatory Visit: Payer: BLUE CROSS/BLUE SHIELD | Attending: Geriatric Medicine

## 2020-07-20 ENCOUNTER — Telehealth: Payer: BLUE CROSS/BLUE SHIELD

## 2020-07-20 DIAGNOSIS — N3000 Acute cystitis without hematuria: Secondary | ICD-10-CM

## 2020-07-20 NOTE — Telephone Encounter
Reply by: Talty with patient who does not want to wait for the following week and would like to stay seeing Dr. Grandville Silos on 07/22/20. Thank you!

## 2020-07-20 NOTE — Telephone Encounter
PDL Call to Practice    Reason for Call: Patient's daughter [place] is requesting to speak with Jan regarding patient's appointment scheduled on Friday 07/22/20.    Appointment Related?  [x]  Yes  []  No     If yes; New Consult  Date: 5/27  Time: 1000    Call warm transferred to PDL: [x]  Yes  []  No    Call Received by Practice Representative: Jan

## 2020-07-20 NOTE — Telephone Encounter
Please refer to notes from [place]

## 2020-07-20 NOTE — Telephone Encounter
Dear Dr. Brayton El,     Your patient [name] underwent a breast biopsy on 07/13/2020. The pathology result was:    FINAL DIAGNOSIS   Date Value Ref Range Status   07/13/2020   Final      A. 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  BREAST, LEFT, MASS, 11:00, 5 CM FROM NIPPLE (NEEDLE CORE BIOPSY):  - Invasive ductal carcinoma with lobular features, grade 2 (50% of biopsy; longest involved segment 6 mm)   Modified Bloom and Richardson score: 6 of 9    Tubule formation:   3    Nuclear pleomorphism: 2    Mitotic score:   1  - Ductal carcinoma in situ (DCIS) is not identified  - Breast biomarkers: See immunohistochemistry report below  - HER2 FISH: Pending, will be reported separately    COMMENT: The diagnosis was communicated to Dr. Babs Sciara and Dr. Glennis Brink via [facility] message on 07/14/2020 at 11:40 AM.       IHC REPORT   Date Value Ref Range Status   07/13/2020   Final      BLOCK: A1   FIXATIVE: Formalin    ANTIBODY/PROBE: RESULT/COMMENT  P63   Positive in myoepithelial cells  SMMHC  Positive in myoepithelial cells  Ecadherin  Positive    BLOCK: B1   FIXATIVE: Formalin    ANTIBODY/PROBE: RESULT/COMMENT  Ecadherin  Positive    INTERPRETATION:  See final diagnosis.    Note: The immunoperoxidase stain reported above was developed and its performance characteristics determined by [facility].  It has not been cleared or approved by the U.S. Food and Drug Administration, although such approval is not required for analyte-specific reagents of this type.  Appropriate controls are included for each case.      BREAST BIOMARKER REPORT    BLOCK:  A1  FIXATIVE:  Formalin     RESULT ER/PR:   ESTROGEN   RECEPTOR PROGESTERONE RECEPTOR   Antibody Clone SP1 Clone 636   %Tumor Staining >95% >95%   Intensity (1+ to 3+) 3+ 3+   Leica Bond III with Refine Polymer Detection System, using heat retrieval for 20 minutes with pH6 buffer. Clone SP1 diluted to 1/50 and PR636 to 1/200.    ESTROGEN/PROGESTERONE IMMUNOHISTOCHEMICAL REPORT  Using appropriate positive and negative controls, the test for the presence of these hormone receptor proteins is performed by the immunoperoxidase method, and reported according to the 2009 CAP-ASCO Guidelines for Hormone Receptor testing. Tissue is fixed from 6-72 hours in 10% neutral buffered formalin. A positive ER or PR tumor shows greater than or equal to 1 percent of cells staining, and results are semi-quantitated as indicated above.      RESULT HER-2/neu IHC assay (utilizing FDA-approved DAKO Hercep Test): Arch Pathol Lab Med [phone], ASCO/CAP HER2 Testing in Breast Cancer Update - Veronia Beets al    Test Score: 1+    HER2/neu:  Negative    0 No staining  is observed OR membrane staining that is incomplete and is faint/barely perceptible and in ? 10% of tumor cells Negative   1+ Incomplete membrane staining that is faint/barely perceptible and in   > 10% of tumor cells Negative     2+ Weak to moderate complete membrane staining observed in > 10% of tumor cells  Equivocal   3+ Circumferential membrane staining that is complete, intense, and in   > 10% of tumor cells Positive     Note:  FISH gene amplification testing is pending. Please see separate report.      RESULT Ki-67 (Clone MIB1):  5%    Note: The immunoperoxidase stains reported above for ER, PR, and Ki-67 were developed and their performance characteristics determined by Department of Pathology [facility] Laboratory Medicine, [place] [facility] [place], [place]. They have not been cleared or approved by the U.S. Food and Drug Administration, although such approval is not required for analyte-specific reagents of this type.  Decalcification may adversely affect patient results.  The HER2/neu, ER and PR assays have not been validated on decalcified tissues.  If the tissue is indicated to have been decalcified, results should be interpreted with caution given the possibility of false negative results on decalcified specimens.      BREAST BIOMARKER REPORT    BLOCK:  B1  FIXATIVE:  Formalin     RESULT ER/PR:   ESTROGEN   RECEPTOR PROGESTERONE RECEPTOR   Antibody Clone SP1 Clone 636   %Tumor Staining >95% 0%   Intensity (1+ to 3+) 3+ NA   Leica Bond III with Refine Polymer Detection System, using heat retrieval for 20 minutes with pH6 buffer. Clone SP1 diluted to 1/50 and PR636 to 1/200.    ESTROGEN/PROGESTERONE IMMUNOHISTOCHEMICAL REPORT  Using appropriate positive and negative controls, the test for the presence of these hormone receptor proteins is performed by the immunoperoxidase method, and reported according to the 2009 CAP-ASCO Guidelines for Hormone Receptor testing. Tissue is fixed from 6-72 hours in 10% neutral buffered formalin. A positive ER or PR tumor shows greater than or equal to 1 percent of cells staining, and results are semi-quantitated as indicated above.      RESULT HER-2/neu IHC assay (utilizing FDA-approved DAKO Hercep Test): Arch Pathol Lab Med 2018:1-20, ASCO/CAP HER2 Testing in Breast Cancer Update - Veronia Beets al    Test Score: 2+    HER2/neu:  Equivocal    0 No staining is observed OR membrane staining that is incomplete and is faint/barely perceptible and in ? 10% of tumor cells Negative   1+ Incomplete membrane staining that is faint/barely perceptible and in   > 10% of tumor cells Negative     2+ Weak to moderate complete membrane staining observed in > 10% of tumor cells  Equivocal   3+ Circumferential membrane staining that is complete, intense, and in   > 10% of tumor cells Positive     Note:  FISH gene amplification testing is pending. Please see separate report.      RESULT Ki-67 (Clone MIB1):  10%    Note: The immunoperoxidase stains reported above for ER, PR, and Ki-67 were developed and their performance characteristics determined by Department of Pathology [facility] Laboratory Medicine, [place] [facility] [place], [place]. They have not been cleared or approved by the U.S. Food and Drug Administration, although such approval is not required for analyte-specific reagents of this type.  Decalcification may adversely affect patient results.  The HER2/neu, ER and PR assays have not  been validated on decalcified tissues.  If the tissue is indicated to have been decalcified, results should be interpreted with caution given the possibility of false negative results on decalcified specimens.            This has been reviewed by the Radiologist and is concordant and malignant with the imaging. It is recommended for the patient to have a surgical and oncological consultations at this time  for further management.  Stereotactic biopsy of the right breast distortion can  be considered if breast conservation is a consideration .    Please let us know if you have any questions or if we can assist in any way. Thank you!    Georgie Chard, RN, BSN  [facility]

## 2020-07-20 NOTE — Telephone Encounter
Forwarded by: Lashaunda Schild Joan R. Kenta Laster

## 2020-07-21 DIAGNOSIS — C50912 Malignant neoplasm of unspecified site of left female breast: Secondary | ICD-10-CM

## 2020-07-21 DIAGNOSIS — Z170 Estrogen receptor positive status [ER+]: Secondary | ICD-10-CM

## 2020-07-21 DIAGNOSIS — C50911 Malignant neoplasm of unspecified site of right female breast: Secondary | ICD-10-CM

## 2020-07-21 NOTE — Progress Notes
OUTPATIENT GERIATRICS CLINIC NOTE    PATIENT:  [name]   MRN:  [id number]  DOB:  05-28-1935  DATE OF SERVICE:  07/20/2020  PRIMARY CARE PHYSICIAN: York Ram., MD    CHIEF COMPLAINT:   Chief Complaint   Patient presents with   ??? Hypertension       [facility]:  [facility]    [facility]:    Chaperone status:  No data recorded    HISTORY OF PRESENT ILLNESS     [name] is a 85 y.o. female who presents today for *follow up**. Patient is accompanied by *no one**. History today is per the patient,and review of available recent records in [facility] and Care Everywhere.     Patient is a(n) [x]  reliable []  unreliable historian and additional collateral information was obtained during the visit today from:     Had been quite jittery and nervous.      Feels stronger with every other week infusion.      Per chart review, pertinent medical history:  No past medical history on file.  No past surgical history on file.    ALLERGIES     Allergies   Allergen Reactions   ??? Beta Adrenergic Blockers Other (See Comments)     Avoid d/t Myasthenia Gravis  Avoid d/t Myasthenia Gravis     ??? Levofloxacin Other (See Comments)     Flare of Myasthenia Gravis   ??? Macrolides And Ketolides Other (See Comments)     Avoid d/t Myasthenia Gravis  Use with caution d/t Myasthenia Gravis  Use with caution d/t Myasthenia Gravis     ??? Sulfa Antibiotics Other (See Comments)     May possibly have caused deafness in one ear  May possibly have caused deafness in one ear  other     ??? Botulinum Toxins Other (See Comments)     Muscle weakness r/t Myasthenia Gravis  Muscle weakness r/t Myasthenia Gravis     ??? Statins Other (See Comments)     Muscle aches  Muscle aches  Muscle aches     ??? Plastic Tape Itching and Other (See Comments)     Turns the skin red where applied        MEDICATIONS     Personally reviewed.    Medications that the patient states to be currently taking   Medication Sig   ??? Albuterol Sulfate AEPB Takes 1 to 2 puffs every 4 hours as needed for wheezing or SOB Inhale Takes 1 to 2 puffs every 4 hours as needed for wheezing or SOB ..   ? ascorbic acid 500 mg tablet Take 1 tablet (500 mg total) by mouth two (2) times daily with meals.   ? azaTHIOprine 50 mg tablet Take one and one-half tablet (75 mg total) by mouth two (2) times daily.   ? buPROPion, SR, (WELLBUTRIN SR) 200 mg 12 hr tablet Take 1 tablet (200 mg total) by mouth two (2) times daily.   ? clonazePAM 1 mg tablet 0.5 mg in am and 1 mg in pm.   ? DEXILANT 60 MG DR capsule    ? DULoxetine 20 mg DR capsule Take 2 capsules (40 mg total) by mouth daily.   ? fluocinonide 0.05% external solution    ? fluticasone 50 mcg/act nasal spray Spray 1 spray by nasal route daily.   ? fluticasone-vilanterol (BREO ELLIPTA) 100-25 mcg/inh inhaler Inhale 1 puff daily.   ? GAMMAGARD 30 GM/300ML SOLN every twenty one (21)  days .   ? losartan 25 mg tablet Take 1 tablet (25 mg total) by mouth daily.   ? methenamine hippurate 1 g tablet Take 1 tablet (1 g total) by mouth two (2) times daily.   ? montelukast 10 mg tablet Take 10 mg by mouth as needed for.   ? pyridostigmine 60 mg tablet Take 1 tablet (60 mg total) by mouth three (3) times daily as needed.   ? trimethoprim 100 mg tablet Take 1 tablet (100 mg total) by mouth daily.       SOCIAL HISTORY     Social History     Social History Narrative   ? Not on file        FUNCTIONAL STATUS     BADLs:  IADLs:    Ambulates with   Falls in past year:  Afraid of falling:    GERIATRIC REVIEW OF SYSTEMS     Vision:    []   glasses     []  legally blind  Hearing: []  hearing aids    Nutrition: []  normal   []  impaired  []  vegan  []  vegetarian  []  low salt  []  low-carb   Swallowing: []  impaired   Dentures:   []  yes    Depression:  []  yes   Cognition:     Incontinence: [] urine  []  fecal  []  urine and fecal      ADVANCED CARE PLANNING     Advanced directives on file: []  No  []   Yes ? Completed:     Medical DPOA on file:   []   Yes ?   []   No ? Patient designates ** * to be their surrogate medical decision-maker.         HEALTH CARE MAINTENANCE     IMMUNIZATIONS:   Immunization History   Administered Date(s) Administered   ??? COVID-19, mRNA, (Pfizer - Purple Cap) 30 mcg/0.3 mL 03/11/2019, 04/01/2019, 10/29/2019   ??? COVID-19, mRNA, tris-sucrose (Pfizer - [employer]) 30 mcg/0.3 mL 06/20/2020   ??? DTaP 12/31/2015   ??? influenza vaccine IM quadrivalent (Fluzone Quad) MDV (78 months of age and older) 11/26/2016   ??? influenza vaccine IM quadrivalent adjuvanted (FluAD Quad) (PF) SYR (20 years of age and older) 11/05/2018, 12/01/2019   ??? influenza, unspecified formulation 04/01/2019, 04/01/2019, 12/01/2019, 12/01/2019, 12/01/2019, 12/01/2019   ??? pneumococcal polysaccharide vaccine 23-valent (Pneumovax) 01/10/2019       Mammogram:  DXA:  PAP:  Colonoscopy:      PHYSICAL EXAM     BP 147/73 (BP Location: Right arm, Patient Position: Sitting, Cuff Size: Regular)  ~ Pulse 90  ~ Temp (!) 35.8 ???C (96.5 ???F) (Skin)  ~ Resp 16  ~ Wt 146 lb (66.2 kg)  ~ SpO2 96%  ~ BMI 24.30 kg/m???   Wt Readings from Last 3 Encounters:   07/20/20 146 lb (66.2 kg)   07/14/20 147 lb 1.6 oz (66.7 kg)   05/18/20 147 lb (66.7 kg)         System Check if normal Positive or additional negative findings   GEN  [x]  NAD     Eyes  []  Conj/Lids []  Pupils  []  Fundi   []  Sclerae []  EOM     ENT  []  External ears   []  Otoscopy   []  Gross Hearing    []   External nose   []  Nasal mucosa   []  Lips/teeth/gums    []  Oropharynx    []  Mucus membranes  Neck  [x]  Inspection/palpation    [x]  Thyroid     Resp  [x]  Effort    [x]  Auscultation       CV  []  Rhythm/rate   [x]  Murmurs   [x]  Edema   []  JVP non-elevated    Normal pulses:   []  Radial []  Femoral  []  Pedal     Breast  []  Inspection []  Palpation     GI  []  Bowel sounds    [x]  Nontender   [x]  No distension    []  No rebound or guarding   []  No masses   []  Liver/spleen    []  Rectal     GU  F:  []  External []  vaginal wall         []  Cervix     []  mucus        []  Uterus    []  Adnexa   M:  []  Scrotum []  Penis         []  Prostate     Lymph  [x]  Cervical []  supraclavicular     [x]  Axillae   []  Groin/inguinal     MSK []  Gait  []  Back     Specify site examined:    []  Inspect/palp []  ROM   []  Stability []  Strength/tone []  Used arms to push up from seated to standing position   Assistive device:  []  single point cane []  quad cane  []  FWW []  rollator walker      Skin  []  Inspection []  Palpation     Neuro  []  Alert and oriented     []  CN2-12 intact grossly   []  DTR      []  Muscle strength      []  Sensation   []  Pronator drift   []  Finger to Nose/Heel to Shin   []  Romberg     Psych  []  Insight/judgement     []  Mood/affect    []  Gross cognition            LABS/STUDIES     LABS:  Lab Results   Component Value Date    WBC 6.10 03/23/2020    WBC 4.87 02/16/2020    WBC 4.71 11/25/2019    HGB 12.7 03/23/2020    HGB 12.4 02/16/2020    HGB 13.8 11/25/2019    MCV 97.9 03/23/2020    PLT 418 (H) 03/23/2020    PLT 253 02/16/2020    PLT 374 11/25/2019     Lab Results   Component Value Date    NA 136 06/09/2020    NA 137 05/18/2020    NA 139 03/23/2020    K 4.0 06/09/2020    K 4.3 05/18/2020    K 4.3 03/23/2020    CREAT 1.63 (H) 06/09/2020    CREAT 1.66 (H) 05/18/2020    CREAT 1.59 (H) 03/23/2020    GFRESTNOAA 29 06/09/2020    GFRESTNOAA 28 05/18/2020    GFRESTNOAA 30 03/23/2020    GFRESTAA 34 06/09/2020    GFRESTAA 33 05/18/2020    GFRESTAA 35 03/23/2020    CALCIUM 9.4 06/09/2020    CALCIUM 9.2 05/18/2020    CALCIUM 9.4 03/23/2020     Lab Results   Component Value Date    ALT 20 03/23/2020    ALT 15 02/16/2020    AST 28 03/23/2020    AST 22 02/16/2020    ALKPHOS 80 03/23/2020    BILITOT 0.3 03/23/2020    ALBUMIN 4.2 03/23/2020    ALBUMIN  3.8 (L) 02/16/2020     Lab Results   Component Value Date    TSH 2.8 03/23/2020    TSH 1.00 12/17/2018     Lab Results   Component Value Date    HGBA1C 5.2 03/23/2020    HGBA1C 4.9 09/22/2019     No results found for: CHOL, CHOLDLCAL  No results found for: CHOLDLQ  No results found for: [facility]  Lab Results   Component Value Date    FE 87 07/14/2019    FERRITIN 57 07/14/2019    FOLATE 9.6 11/25/2019    TIBC 356 07/14/2019     Lab Results   Component Value Date    VITAMINB12 3,994 (H) 11/25/2019    VITAMINB12 217 (L) 09/22/2019     Lab Results   Component Value Date    BNP 92 09/22/2019     No results found for: PSATOTAL      STUDIES:    EKG  This data element was independently reviewed and interpreted by me     NSR NO ST or T changes    XR CHEST PA LAT 2V  September 22, 2019?  COMPARISON: [facility] December 17, 2018  ?  History: sob  ?  FINDINGS:  ?  Lungs: Clear  Heart/aorta: Normal heart size. The thoracic aorta is mildly calcified, tortuous and ectatic.  Adenopathy: None  Pleura: No effusion  Bones and Chest wall: No acute bony or body wall  findings. Osteopenia and bony maturational changes. Right upper quadrant cholecystectomy clips stable from October 2020  ?  ?  IMPRESSION:  ?  No acute findings or abnormalities related to provided history.    ASSESSMENT and PLAN     [name] is a 85 y.o. female who presents today for *follow up**.          #HTN--Increased nifedipine CR 60 mg daily. Continue losartan 25 mg.    May need uptitration of losartan.  Did not take meds today.  #Aortic Calcification--noted on chest imaging     September 22, 2019?       --Not Worsening.  Chronic.  Will monitor and treat underlying risk factors with medical and lifestyle interventions as warranted by balance between benefits and burdens.  #allergic rhinitis--trial flonase  #Macrocytosis--  #Deconditioning--initiate home PT  #GERD--s/p Nisan fundoplication  #COPD--mixed--per PFT's from [name].--consider reinitiation of inhalers.  #s/p rectocele repair  #s/p cholecystectomy  #Immunosupressed status--medication related.  Chronic. CTM  #Nocturnal hypoxemia--presumably related to MG crisis but has history of COPD. ?Will order nocturnal home O2 study and consider PFT's/Pulmonary evaluation once settled down. ?May need to restart BREO inhaler.  #Myasthenia Gravis--s/p plasmapheresis x5 October 2020, no Thymoma, on Azathioprine 150 daily and mestinon 60 tid.??????Saw???Dr. Enedina Finner at [facility]. ???Was considering Soliris.??????Ordering IVIG and MRI.  Encourage ongoing neurology follow up.   #IBS--titrate miralax to comfort. ???Consider duloxetine substitute for buproprion.  #Interstitial cystitis--on daily Trimethoprim 100 for UTI prophylaxis. ???Using bladder instillations prn. OFF topical estrogen due to advice from neurologist re: MG.  To see Dr. Lorenz Coaster.  Suspect recent flare related to breast CA re-diagnosis.  Doubt pseudomonas bactiuria is pathogen at present.  WIll complete current meropenum.  #Major Depression--active but appears better s/p  increase Duloxetine to 40 mg daily. Referred patient to Dr. Randie Heinz previously.  #Allergic rhinitis--previously on nasal steroids.  #Breast CA--Now with bx+.  To see [facility] Friday. ???Patient declined surgery in 2019 and experienced dry mouth from Tamoxifen. ???Was considering Anastrozole.Per records from [facility]:   ''  07/20/2017 Screening detected right breast calcifications and distortion. The distortion was in the upper outer quadrant: Biopsy DCIS intermediate grade with CSL; calcifications UIQ: 2.8 cm: Biopsy DCIS+ ALH +CSL; 4 cm apart, axilla negative, Tis NX stage 0''    #DM with CKD--stabe 3b--based on labs from [name]. ???FOllow here. ???If BP permits, may consider ARB BUT patient with h/o angioedema so will ONLY initiate this agent with great caution.  Refer to Ophthalmology.  #OA knees--previously getting hyaluronic acid injections.  #Anxiety  #Benzodiazepine dependence--back to 1.5 mg daily due to recent stress  #Fibromyalgia--duloxetine as above.   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  #Statin intolerance      FOLLOW-UP     RTC     Future Appointments   Date Time Provider Department Center   07/22/2020 10:00 AM Velvet Bathe., MD Surg [facility]   08/16/2020 11:30 AM [facility] B200 NEUROLOGY   09/12/2020  3:00 PM Baxter, Blanch Media., MD UROENDO [phone] UROLOGY         The above plan of care, diagnosis, orders, and follow-up were discussed with the patient and/or surrogate. Questions related to this recommended plan of care were answered.    ANALYSIS OF DATA (Needs to meet 1 category for moderate and 2 categories for high LOS)     I have:     Category 1 (Needs 3 for moderate and high LOS)     []  Reviewed []  1 []  2 []  ? 3 unique laboratory, radiology, and/or diagnostic tests noted below    Test/Study:  on date .    []  Reviewed []  1 []  2 []  ? 3 prior external notes and incorporated into patient assessment    I reviewed Dr. 's note in specialty  from date .    []  Discussed management or test interpretation with external provider(s) as noted      []  Ordered []  1 []  2 []  ? 3 unique laboratory, radiology, and/or diagnostic tests noted in A&P    []  Obtained history from independent historian:       Category 2  []  Independently interpreted the test    Category 3  []  Discussed management or test interpretation with external provider(s) as noted      INTERACTION COMPLEXITY and SOCIAL DETERMINANTS of HEALTH       PROBLEM COMPLEXITY   []  New problem with uncertain diagnosis or prognosis (moderate)   []  Multiple stable chronic problems (moderate)   []  Chronic problem not stable - not controlled, symptomatic, or worsening (moderate)   []  Severe exacerbation of chronic problem (high)   []  New or chronic problem that poses threat to life or bodily function (high)     MANAGEMENT COMPLEXITY   []  Old or external/outside records reviewed   []  Discussion with alternate (proxy) if patient with impaired communication / comprehension ability (e.g., dementia, aphasia, severe hearing loss).   []  Repeated questions (or disagreement) between patient and/among caregivers/family during the visit.  []  Caregiver/patient emotions/behavior/beliefs interfering with implementation of treatment plan.  []  Independent interpretation of test (EKG, Chest XRay)   []  Discussion of case with a consultant physician     RISK LEVEL   []  Prescription drug management (moderate)   []  Minor surgery with CV risk factors or elective major surgery (moderate)   []  Dx or Rx significantly limited by SDoH (inadequate housing, living alone, poor health care access, inappropriate diet; low literacy) (moderate)   []  Major surgery - elective with CV  risk factors or emergent (high)   []  Need for hospitalization (high)   []  New DNR or de-escalation of care (high)      SDoH  The diagnosis or treatment of said conditions is significantly limited by the following social determinants of health:  []  Z59.0 Homelessness  []  Z59.1 Inadequate housing  []  Z59.2 Discord with neighbors, lodgers and landlord  []  Z60.2 Problems related to living alone  []  Z59.8 Other problems related to housing and economic circumstances  []  Z59.4 Lack of adequate food and safe drinking water  []  Z59.6 Low income  []  Z59.7 Insufficient social insurance and welfare support  []  Z59.9 Problems related to housing and economic circumstances, unspecified  []  Z75.3 Unavailability and inaccessibility of health care facilities  []  Z75.4 Unavailability and inaccessibility of other helping agencies  []  Z72.4 Inappropriate diet and eating habits  []  Z62.820 Parent-biological child conflict  []  Z63.8 Other specified problems related to primary support group  []  Z55.0 Illiteracy and low level literacy   []  Z56.9 Unspecified problems related to employment        If Billing Based on Time:     I performed the following items on the day of service:    [x]  Preparing to see the patient (e.g., review of tests)  [x]  Obtaining and/or reviewing separately obtained history   [x]  Performing a medically appropriate examination and/or evaluation   [x]  Counseling and educating the patient/family/caregiver   [x]  Ordering medications, tests, or procedures  [x]  Referring and communicating with other healthcare professionals (when not separately reported)  [x]  Documenting clinical information in the EHR  []  Independently interpreting results and communicating results to patient/family/caregiver    I spent the following total amount of time on these tasks on the day of service:  New Patient     Established Patient  []  15-29 minutes - 99202    []  up to 9 minutes - 99211  []  30-44 minutes - 99203     []  10-19 minutes - 99212   []  45-59 minutes - 99204      []  20-29 minutes - 99213   []  60-74 minutes - 99205   []  30-39 minutes - 99214         [x]  40-55 minutes - 99215    []  I spent an additional ** * 15-minute-increment(s) for a total of * ** minutes on these tasks on the day of service. [phone] for each additional 15 minutes.)    Author: York Ram, MD 07/20/2020

## 2020-07-21 NOTE — Progress Notes
[facility]    Patient: [name]  MRN: [id number]  DOB: 06-11-1935    Date of Service: 07/22/2020    Patient Care Team:  York Ram., MD as PCP - General (Medicine, Geriatric Medicine)  Referring Provider: Dr. Brayton El    Reason for Visit: Bilateral breast cancer    HPI:  [name] is a 85 y.o. 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 She elected not to have surgery at that time. She took tamoxifen-->anastrozole for a short period of time, but discontinued endocrine therapy due to side effects.     Ms. Elmi then noted left breast pain in April 2022. Subsequently, she underwent a diagnostic bilateral breast imaging work-up in April to May 2022 at [facility] that revealed heterogeneously dense breast tissue and the following findings:    Right breast:  1. A mass with spiculated margins and associated coil biopsy marker clip in the upper outer quadrant of the right breast at anterior depth-->biopsy-proven DCIS in 2019.    2. A solid mass with indistinct margins measuring 14 x 4 x 10 mm in the right breast at 1 o'clock located 6 centimeters from the nipple. There is associated microclip. This correlates with the mammographic focal asymmetry the X clip-->biopsy proven DCIS from 2019.     3. Adjacent area of architectural distortion in the right breast upper inner quadrant at posterior depth, slightly more posterior than the X microclip. Ultrasound demonstrates a not parallel solid mass with irregular margins measuring 8 x 4 x 5 mm seen in the right breast at 12 o'clock, upper outer quadrant located 6 centimeters from the nipple. This may correspond with mammographic architectural distortion-->invasive ductal carcinoma with lobular and cribriform features, grade 2, ER+, PR+, Her-2 1+ on IHC and negative on FISH, Ki-67 5%.     4. Grouped calcifications measuring 6 mm seen in the upper outer quadrant of the right breast at posterior depth located 5 centimeters from the nipple. Pending results of biopsy of #3.     5. There is an oval mass measuring 4 x 3 x 2 mm seen in the right breast at 12 o'clock, upper outer quadrant located 6 centimeters from the nipple. Probably benign.      6. There is a solid mass with indistinct margins measuring 5 x 3 x 2 mm seen in the right breast at 2 o'clock, upper inner quadrant located 4 centimeters from the nipple. Probably benign.      Left breast:  1. An irregular mass with angular margins measuring 10 x 9 x 9 mm seen in the left breast at 11 o'clock located 5 centimeters from the nipple. This corresponds to the mammographic and palpable finding-->invasive ductal carcinoma with lobular, grade 2, ER+, PR-, Her-2 2+ on IHC and pending on FISH, Ki-67 10%.    2. A vague hypoechoic area measuring 8 x 5 x 6 mm seen in the left breast at 12 o'clock, upper outer quadrant located 3 centimeters from the nipple. Probably benign.      3. A solid mass measuring 6 x 3 x 5 mm seen in the left breast at 1 o'clock located 1 centimeter from the nipple. Probably benign.      4. There is an axillary lymph node in the left axilla with cortical thickness measuring up to 6 mm.    [name] presents today for further  evaluation and management of this issue.     G2P2  Menopausal status: Postmenopausal  Hormone therapy use: None  Ashkenazi Jewish: No    Past Medical History:   Diagnosis Date   ? Breast cancer (HCC/RAF) 12/20/2018     No past surgical history on file.  Current Outpatient Medications   Medication Sig   ? Albuterol Sulfate AEPB Takes 1 to 2 puffs every 4 hours as needed for wheezing or SOB Inhale Takes 1 to 2 puffs every 4 hours as needed for wheezing or SOB ..   ? ascorbic acid 500 mg tablet Take 1 tablet (500 mg total) by mouth two (2) times daily with meals.   ? azaTHIOprine 50 mg tablet Take one and one-half tablet (75 mg total) by mouth two (2) times daily.   ? buPROPion, SR, (WELLBUTRIN SR) 200 mg 12 hr tablet Take 1 tablet (200 mg total) by mouth two (2) times daily.   ? clonazePAM 1 mg tablet 0.5 mg in am and 1 mg in pm.   ? DEXILANT 60 MG DR capsule    ? DULoxetine 20 mg DR capsule Take 2 capsules (40 mg total) by mouth daily.   ? fluocinonide 0.05% external solution    ? fluticasone 50 mcg/act nasal spray Spray 1 spray by nasal route daily.   ? fluticasone-vilanterol (BREO ELLIPTA) 100-25 mcg/inh inhaler Inhale 1 puff daily.   ? GAMMAGARD 30 GM/300ML SOLN every twenty one (21) days .   ? losartan 25 mg tablet Take 1 tablet (25 mg total) by mouth daily.   ? methenamine hippurate 1 g tablet Take 1 tablet (1 g total) by mouth two (2) times daily.   ? montelukast 10 mg tablet Take 10 mg by mouth as needed for.   ? pyridostigmine 60 mg tablet Take 1 tablet (60 mg total) by mouth three (3) times daily as needed.   ? trimethoprim 100 mg tablet Take 1 tablet (100 mg total) by mouth daily.     No current facility-administered medications for this visit.     Facility-Administered Medications Ordered in Other Visits   Medication Dose Route Frequency   ? lidocaine 2% inj 5 mL  5 mL Intradermal Once   ? lidocaine 2% inj 5 mL  5 mL Intradermal Once   ? lidocaine-EPINEPHrine 1 %-1:100000 inj 4.8 mL  4.8 mL Intradermal Once   ? lidocaine-EPINEPHrine 1 %-1:100000 inj 5 mL  5 mL Intradermal Once     Allergies   Allergen Reactions   ? Beta Adrenergic Blockers Other (See Comments)     Avoid d/t Myasthenia Gravis  Avoid d/t Myasthenia Gravis     ? Levofloxacin Other (See Comments)     Flare of Myasthenia Gravis   ? Macrolides And Ketolides Other (See Comments)     Avoid d/t Myasthenia Gravis  Use with caution d/t Myasthenia Gravis  Use with caution d/t Myasthenia Gravis     ? Sulfa Antibiotics Other (See Comments)     May possibly have caused deafness in one ear  May possibly have caused deafness in one ear  other     ? Botulinum Toxins Other (See Comments)     Muscle weakness r/t Myasthenia Gravis  Muscle weakness r/t Myasthenia Gravis     ??? Statins Other (See Comments)     Muscle aches  Muscle aches  Muscle aches     ??? Plastic Tape Itching and Other (See Comments)     Turns the skin red  where applied     No family history on file.  Social History     Tobacco Use   ??? Smoking status: Former Smoker     Quit date: 1985     Years since quitting: 37.4   ??? Smokeless tobacco: Former Neurosurgeon   Substance Use Topics   ??? Alcohol use: Not Currently   ??? Drug use: Never      Review of Systems:  A 14-point system review questionnaire was answered by the patient. I reviewed the questionnaire in detail, and it is attached to the chart.     Physical Examination:  Vitals: BP 157/71 (BP Location: Left arm, Patient Position: Sitting, Cuff Size: Regular)  ~ Pulse (!) 105  ~ Temp 36.1 ???C (96.9 ???F) (Forehead)  ~ Ht 5' 5'' (1.651 m)  ~ Wt 146 lb (66.2 kg)  ~ SpO2 93%  ~ BMI 24.30 kg/m???    General: Alert, no apparent distress.  Breast inspection: The patient is examined in the seated upright and supine positions. No visible masses, skin fixations, areas of skin edema or erythema, nipple/areolar lesions, or nipple retractions.  Breast examination: Mild diffuse fibroglandular nodularity. No dominant or distinct masses, skin fixations, nipple discharge or areas of focal tenderness bilaterally.  Abdomen: Soft, non-tender, no hepatosplenomegaly or masses appreciated.   Lymph Nodes: No cervical, supraclavicular, or axillary adenopathy bilaterally.    Data Review (I personally reviewed all outside records, imaging studies and pathology reports):   Imaging:   See [facility] imaging.    Pathology:  FINAL DIAGNOSIS   Date Value Ref Range Status   07/13/2020   Final      A. 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  BREAST, LEFT, MASS, 11:00, 5 CM FROM NIPPLE (NEEDLE CORE BIOPSY):  - Invasive ductal carcinoma with lobular features, grade 2 (50% of biopsy; longest involved segment 6 mm)   Modified Bloom and Richardson score: 6 of 9    Tubule formation:   3    Nuclear pleomorphism: 2    Mitotic score:   1  - Ductal carcinoma in situ (DCIS) is not identified  - Breast biomarkers: See immunohistochemistry report below  - HER2 FISH: Pending, will be reported separately    COMMENT: The diagnosis was communicated to Dr. Babs Sciara and Dr. Glennis Brink via [facility] message on 07/14/2020 at 11:40 AM.       IHC REPORT   Date Value Ref Range Status   07/13/2020   Final      BLOCK: A1   FIXATIVE: Formalin    ANTIBODY/PROBE: RESULT/COMMENT  P63   Positive in myoepithelial cells  SMMHC  Positive in myoepithelial cells  Ecadherin  Positive    BLOCK: B1   FIXATIVE: Formalin    ANTIBODY/PROBE: RESULT/COMMENT  Ecadherin  Positive    INTERPRETATION:  See final diagnosis.    Note: The immunoperoxidase stain reported above was developed and its performance characteristics determined by [facility].  It has not been cleared or approved by the U.S. Food and Drug Administration, although such approval is not required for analyte-specific reagents of this type.  Appropriate controls are included for each case.      BREAST BIOMARKER REPORT    BLOCK:  A1  FIXATIVE:  Formalin     RESULT ER/PR:   ESTROGEN   RECEPTOR PROGESTERONE RECEPTOR   Antibody Clone SP1 Clone 636   %Tumor Staining >95% >95%   Intensity (1+ to 3+) 3+ 3+   Leica Bond III with Refine Polymer Detection System, using heat retrieval for 20 minutes with pH6 buffer. Clone SP1 diluted to 1/50 and PR636 to 1/200.    ESTROGEN/PROGESTERONE IMMUNOHISTOCHEMICAL REPORT  Using appropriate positive and negative controls, the test for the presence of these hormone receptor proteins is performed by the immunoperoxidase method, and reported according to the 2009 CAP-ASCO Guidelines for Hormone Receptor testing. Tissue is fixed from 6-72 hours in 10% neutral buffered formalin. A positive ER or PR tumor shows greater than or equal to 1 percent of cells staining, and results are semi-quantitated as indicated above.      RESULT HER-2/neu IHC assay (utilizing FDA-approved DAKO Hercep Test): Arch Pathol Lab Med 2018:1-20, ASCO/CAP HER2 Testing in Breast Cancer Update - Veronia Beets al    Test Score: 1+    HER2/neu:  Negative    0 No staining is observed OR membrane staining that is incomplete and is faint/barely perceptible and in ? 10% of tumor cells Negative   1+ Incomplete membrane staining that is faint/barely perceptible and in   > 10% of tumor cells Negative     2+ Weak to moderate complete membrane staining observed in > 10% of tumor cells  Equivocal   3+ Circumferential membrane staining that is complete, intense, and in   > 10% of tumor cells Positive     Note:  FISH gene amplification testing is pending. Please see separate report.      RESULT Ki-67 (Clone MIB1):  5%    Note: The immunoperoxidase stains reported above for ER, PR, and Ki-67 were developed and their performance characteristics determined by Department of Pathology & Laboratory Medicine, [place] [facility] [place], [place]. They have not been cleared or approved by the U.S. Food and Drug Administration, although such approval is not required for analyte-specific reagents of this type.  Decalcification may adversely affect patient results.  The HER2/neu, ER and PR assays have not been validated on decalcified tissues.  If the tissue is indicated to have been decalcified, results should be interpreted with caution given the possibility of false negative results on decalcified specimens.      BREAST BIOMARKER REPORT    BLOCK:  B1  FIXATIVE:  Formalin     RESULT ER/PR:   ESTROGEN   RECEPTOR PROGESTERONE RECEPTOR   Antibody Clone SP1 Clone 636   %Tumor Staining >95% 0%   Intensity (1+ to 3+) 3+ NA   Leica Bond III with Refine Polymer Detection System, using heat retrieval for 20 minutes with pH6 buffer. Clone SP1 diluted to 1/50 and PR636 to 1/200.    ESTROGEN/PROGESTERONE IMMUNOHISTOCHEMICAL REPORT  Using appropriate positive and negative controls, the test for the presence of these hormone receptor proteins is performed by the immunoperoxidase method, and reported according to the 2009 CAP-ASCO Guidelines for Hormone Receptor testing. Tissue is fixed from 6-72 hours in 10% neutral buffered formalin. A positive ER or PR tumor shows greater than or equal to 1 percent of  cells staining, and results are semi-quantitated as indicated above.      RESULT HER-2/neu IHC assay (utilizing FDA-approved DAKO Hercep Test): Arch Pathol Lab Med 2018:1-20, ASCO/CAP HER2 Testing in Breast Cancer Update - Veronia Beets al    Test Score: 2+    HER2/neu:  Equivocal    0 No staining is observed OR membrane staining that is incomplete and is faint/barely perceptible and in ? 10% of tumor cells Negative   1+ Incomplete membrane staining that is faint/barely perceptible and in   > 10% of tumor cells Negative     2+ Weak to moderate complete membrane staining observed in > 10% of tumor cells  Equivocal   3+ Circumferential membrane staining that is complete, intense, and in   > 10% of tumor cells Positive     Note:  FISH gene amplification testing is pending. Please see separate report.      RESULT Ki-67 (Clone MIB1):  10%    Note: The immunoperoxidase stains reported above for ER, PR, and Ki-67 were developed and their performance characteristics determined by Department of Pathology & Laboratory Medicine, [facility] [place], [place]. They have not been cleared or approved by the U.S. Food and Drug Administration, although such approval is not required for analyte-specific reagents of this type.  Decalcification may adversely affect patient results.  The HER2/neu, ER and PR assays have not been validated on decalcified tissues.  If the tissue is indicated to have been decalcified, results should be interpreted with caution given the possibility of false negative results on decalcified specimens.           Impression:  Litzy Dicker is a 85 y.o. woman with multifocal IDC/DCIS in the right breast and IDC in the left breast.    [name] and I had a long discussion during which we discussed her case and the general treatment options for breast cancer with specifics regarding her case. Surgery is indicated. In light of her multifocal disease, she may not be a candidate for breast conserving surgery on the right. We will get a breast MRI to further define the extent of disease in both breasts. If she is not a candidate for breast conserving surgery on the right, then we will not pursue biopsy of the suspicious calcifications that have not been biopsied. She will likely require further investigation of the left axillary lymph nodes. Genetic testing is indicated and will be performed today.     [name] understands everything we discussed, and all of her questions were answered.     Plan and Recommendations:  Breast MRI ASAP-->biopsy of R breast calcifications and/or further investigation of L axillary lymph nodes?. RTC in 3 weeks.  GTS.  F/u results of L breast FISH.  6 mo f/u b/l breast ultrasounds due in October 2022.     Total face-to-face and non-face-to-face encounter time today = 60 minutes, which included chart preparation, review of relevant breast imaging studies and labs, review of primary care and oncology provider notes, examination, counseling, order entry, case discussion with multidisciplinary providers, documentation, and coordination of additional care.        Makari Portman K. Janee Morn, M.D.  Assistant Professor of Surgery  [employer] at [facility]  [place] office: [phone]  [facility] office: [phone]

## 2020-07-22 ENCOUNTER — Ambulatory Visit: Payer: BLUE CROSS/BLUE SHIELD

## 2020-07-26 ENCOUNTER — Ambulatory Visit: Payer: BLUE CROSS/BLUE SHIELD

## 2020-07-26 ENCOUNTER — Telehealth: Payer: BLUE CROSS/BLUE SHIELD

## 2020-07-26 MED ORDER — BUPROPION HCL ER (SR) 200 MG PO TB12
200 mg | ORAL_TABLET | Freq: Two times a day (BID) | ORAL | 3 refills | Status: AC
Start: 2020-07-26 — End: ?

## 2020-07-26 NOTE — Telephone Encounter
The patient was offered the opportunity to have genetic testing through [facility]:[id number]}.    The patient {HAS/HAS NOT:20194} watched informational videos on hereditary cancer and genetic testing. These include a summary of hereditary cancer, the pros and cons of testing, possible test results and their implication for cancer treatment and screening or prevention measures. The patient was given the opportunity to ask questions.     If the patient has not yet watched the informational videos, they will be sent via [employer]. The order will not be placed until the patient has submitted her consent.    {GTS Response:37119}    The patient confirmed that she {HAS/DOES NOT HAVE:19233} a history of a bone marrow transplant, an active hematological malignancy, or a recent blood transfusion.     ***The patient has had one of the conditions listed above, and did not have their blood drawn for genetic testing. The patient was informed that they will need a separate appointment to complete the appropriate sample collection process. A referral to Cancer Genetics was placed.    (Please forward this note or telephone encounter to the pool: HEM ONC [facility].)

## 2020-07-26 NOTE — Telephone Encounter
The patient was offered the opportunity to have genetic testing through [facility]:    The patient HAS watched informational videos on hereditary cancer and genetic testing. These include a summary of hereditary cancer, the pros and cons of testing, possible test results and their implication for cancer treatment and screening or prevention measures. The patient was given the opportunity to ask questions.     If the patient has not yet watched the informational videos, they will be sent via [employer]. The order will not be placed until the patient has submitted her consent.    {GTS Response:[id number]    The patient confirmed that she DOES NOT HAVE  a history of a bone marrow transplant, an active hematological malignancy, or a recent blood transfusion.     (Please forward this note or telephone encounter to the pool: HEM ONC [facility].)

## 2020-07-28 ENCOUNTER — Ambulatory Visit: Payer: BLUE CROSS/BLUE SHIELD | Attending: Geriatric Medicine

## 2020-08-01 ENCOUNTER — Ambulatory Visit: Payer: BLUE CROSS/BLUE SHIELD

## 2020-08-02 ENCOUNTER — Ambulatory Visit: Payer: BLUE CROSS/BLUE SHIELD

## 2020-08-02 ENCOUNTER — Telehealth: Payer: BLUE CROSS/BLUE SHIELD

## 2020-08-02 DIAGNOSIS — Z170 Estrogen receptor positive status [ER+]: Secondary | ICD-10-CM

## 2020-08-02 DIAGNOSIS — C50919 Malignant neoplasm of unspecified site of unspecified female breast: Secondary | ICD-10-CM

## 2020-08-02 NOTE — Progress Notes
OUTPATIENT GERIATRICS CLINIC NOTE    PATIENT:  [name]   MRN:  [id number]  DOB:  10-15-1935  DATE OF SERVICE:  07/28/2020  PRIMARY CARE PHYSICIAN: York Ram., MD    CHIEF COMPLAINT:   Chief Complaint   Patient presents with   ? discuss recent dx of breast cancer       [facility]:  [facility]    [facility]:    Chaperone status:  No data recorded    HISTORY OF PRESENT ILLNESS     [name] is a 85 y.o. female who presents today for *follow up**. Patient is accompanied by *no one**. History today is per the patient,and review of available recent records in [facility] and Care Everywhere.     Patient is a(n) [x]  reliable []  unreliable historian and additional collateral information was obtained during the visit today from:     Had been quite jittery and nervous.      Feels stronger with every other week infusion.      Per chart review, pertinent medical history:  Past Medical History:   Diagnosis Date   ? Breast cancer (HCC/RAF) 12/20/2018     No past surgical history on file.    ALLERGIES     Allergies   Allergen Reactions   ? Beta Adrenergic Blockers Other (See Comments)     Avoid d/t Myasthenia Gravis  Avoid d/t Myasthenia Gravis     ? Levofloxacin Other (See Comments)     Flare of Myasthenia Gravis   ? Macrolides And Ketolides Other (See Comments)     Avoid d/t Myasthenia Gravis  Use with caution d/t Myasthenia Gravis  Use with caution d/t Myasthenia Gravis     ? Sulfa Antibiotics Other (See Comments)     May possibly have caused deafness in one ear  May possibly have caused deafness in one ear  other     ? Botulinum Toxins Other (See Comments)     Muscle weakness r/t Myasthenia Gravis  Muscle weakness r/t Myasthenia Gravis     ? Statins Other (See Comments)     Muscle aches  Muscle aches  Muscle aches     ? Plastic Tape Itching and Other (See Comments)     Turns the skin red where applied        MEDICATIONS     Personally reviewed.    Medications that the patient states to be currently taking   Medication Sig   ? Albuterol Sulfate AEPB Takes 1 to 2 puffs every 4 hours as needed for wheezing or SOB Inhale Takes 1 to 2 puffs every 4 hours as needed for wheezing or SOB ..   ? ascorbic acid 500 mg tablet Take 1 tablet (500 mg total) by mouth two (2) times daily with meals.   ? azaTHIOprine 50 mg tablet Take one and one-half tablet (75 mg total) by mouth two (2) times daily.   ? buPROPion, SR, (WELLBUTRIN SR) 200 mg 12 hr tablet Take 1 tablet (200 mg total) by mouth two (2) times daily.   ? clonazePAM 1 mg tablet 0.5 mg in am and 1 mg in pm.   ? DEXILANT 60 MG DR capsule    ? DULoxetine 20 mg DR capsule Take 2 capsules (40 mg total) by mouth daily.   ? fluocinonide 0.05% external solution    ? fluticasone 50 mcg/act nasal spray Spray 1 spray by nasal route daily.   ? fluticasone-vilanterol (BREO ELLIPTA) 100-25 mcg/inh  inhaler Inhale 1 puff daily.   ? GAMMAGARD 30 GM/300ML SOLN every twenty one (21) days .   ? losartan 25 mg tablet Take 1 tablet (25 mg total) by mouth daily.   ? methenamine hippurate 1 g tablet Take 1 tablet (1 g total) by mouth two (2) times daily.   ? montelukast 10 mg tablet Take 10 mg by mouth as needed for.   ? pyridostigmine 60 mg tablet Take 1 tablet (60 mg total) by mouth three (3) times daily as needed.   ? trimethoprim 100 mg tablet Take 1 tablet (100 mg total) by mouth daily.       SOCIAL HISTORY     Social History     Social History Narrative   ? Not on file        FUNCTIONAL STATUS     BADLs:  IADLs:    Ambulates with   Falls in past year:  Afraid of falling:    GERIATRIC REVIEW OF SYSTEMS     Vision:    []   glasses     []  legally blind  Hearing: []  hearing aids    Nutrition: []  normal   []  impaired  []  vegan  []  vegetarian  []  low salt  []  low-carb   Swallowing: []  impaired   Dentures:   []  yes    Depression:  []  yes   Cognition:     Incontinence: [] urine  []  fecal  []  urine and fecal      ADVANCED CARE PLANNING     Advanced directives on file: []  No  []   Yes ? Completed:     Medical DPOA on file:   []   Yes ?   []   No ? Patient designates ** * to be their surrogate medical decision-maker.         HEALTH CARE MAINTENANCE     IMMUNIZATIONS:   Immunization History   Administered Date(s) Administered   ? COVID-19, mRNA, (Pfizer - Purple Cap) 30 mcg/0.3 mL 03/11/2019, 04/01/2019, 10/29/2019   ? COVID-19, mRNA, tris-sucrose (Pfizer - [employer]) 30 mcg/0.3 mL 06/20/2020   ? DTaP 12/31/2015   ? influenza vaccine IM quadrivalent (Fluzone Quad) MDV (50 months of age and older) 11/26/2016   ? influenza vaccine IM quadrivalent adjuvanted (FluAD Quad) (PF) SYR (41 years of age and older) 11/05/2018, 12/01/2019   ? influenza, unspecified formulation 04/01/2019, 04/01/2019, 12/01/2019, 12/01/2019, 12/01/2019, 12/01/2019   ? pneumococcal polysaccharide vaccine 23-valent (Pneumovax) 01/10/2019       Mammogram:  DXA:  PAP:  Colonoscopy:      PHYSICAL EXAM     BP 156/84  ~ Pulse 90  ~ Temp 36.7 ?C (98 ?F) (Temporal)  ~ Resp 19  ~ Ht 5' 5'' (1.651 m)  ~ Wt 149 lb 12.8 oz (67.9 kg)  ~ SpO2 98%  ~ BMI 24.93 kg/m?   Wt Readings from Last 3 Encounters:   07/28/20 149 lb 12.8 oz (67.9 kg)   07/22/20 146 lb (66.2 kg)   07/20/20 146 lb (66.2 kg)         System Check if normal Positive or additional negative findings   GEN  [x]  NAD     Eyes  []  Conj/Lids []  Pupils  []  Fundi   []  Sclerae []  EOM     ENT  []  External ears   []  Otoscopy   []  Gross Hearing    []   External nose   []  Nasal mucosa   []  Lips/teeth/gums    []   Oropharynx    []  Mucus membranes      Neck  [x]  Inspection/palpation    [x]  Thyroid     Resp  [x]  Effort    [x]  Auscultation       CV  []  Rhythm/rate   [x]  Murmurs   [x]  Edema   []  JVP non-elevated    Normal pulses:   []  Radial []  Femoral  []  Pedal     Breast  []  Inspection []  Palpation     GI  []  Bowel sounds    [x]  Nontender   [x]  No distension    []  No rebound or guarding   []  No masses   []  Liver/spleen    []  Rectal     GU  F:  []  External []  vaginal wall         []  Cervix     []  mucus []  Uterus    []  Adnexa   M:  []  Scrotum []  Penis         []  Prostate     Lymph  [x]  Cervical []  supraclavicular     [x]  Axillae   []  Groin/inguinal     MSK []  Gait  []  Back     Specify site examined:    []  Inspect/palp []  ROM   []  Stability []  Strength/tone []  Used arms to push up from seated to standing position   Assistive device:  []  single point cane []  quad cane  []  FWW []  rollator walker      Skin  []  Inspection []  Palpation     Neuro  []  Alert and oriented     []  CN2-12 intact grossly   []  DTR      []  Muscle strength      []  Sensation   []  Pronator drift   []  Finger to Nose/Heel to Shin   []  Romberg     Psych  []  Insight/judgement     []  Mood/affect    []  Gross cognition            LABS/STUDIES     LABS:  Lab Results   Component Value Date    WBC 6.10 03/23/2020    WBC 4.87 02/16/2020    WBC 4.71 11/25/2019    HGB 12.7 03/23/2020    HGB 12.4 02/16/2020    HGB 13.8 11/25/2019    MCV 97.9 03/23/2020    PLT 418 (H) 03/23/2020    PLT 253 02/16/2020    PLT 374 11/25/2019     Lab Results   Component Value Date    NA 136 06/09/2020    NA 137 05/18/2020    NA 139 03/23/2020    K 4.0 06/09/2020    K 4.3 05/18/2020    K 4.3 03/23/2020    CREAT 1.63 (H) 06/09/2020    CREAT 1.66 (H) 05/18/2020    CREAT 1.59 (H) 03/23/2020    GFRESTNOAA 29 06/09/2020    GFRESTNOAA 28 05/18/2020    GFRESTNOAA 30 03/23/2020    GFRESTAA 34 06/09/2020    GFRESTAA 33 05/18/2020    GFRESTAA 35 03/23/2020    CALCIUM 9.4 06/09/2020    CALCIUM 9.2 05/18/2020    CALCIUM 9.4 03/23/2020     Lab Results   Component Value Date    ALT 20 03/23/2020    ALT 15 02/16/2020    AST 28 03/23/2020    AST 22 02/16/2020    ALKPHOS 80 03/23/2020    BILITOT 0.3 03/23/2020    ALBUMIN 4.2  03/23/2020    ALBUMIN 3.8 (L) 02/16/2020     Lab Results   Component Value Date    TSH 2.8 03/23/2020    TSH 1.00 12/17/2018     Lab Results   Component Value Date    HGBA1C 5.2 03/23/2020    HGBA1C 4.9 09/22/2019     No results found for: CHOL, CHOLDLCAL  No results found for: CHOLDLQ  No results found for: [facility]  Lab Results   Component Value Date    FE 87 07/14/2019    FERRITIN 57 07/14/2019    FOLATE 9.6 11/25/2019    TIBC 356 07/14/2019     Lab Results   Component Value Date    VITAMINB12 3,994 (H) 11/25/2019    VITAMINB12 217 (L) 09/22/2019     Lab Results   Component Value Date    BNP 92 09/22/2019     No results found for: PSATOTAL      STUDIES:    EKG  This data element was independently reviewed and interpreted by me     NSR NO ST or T changes    XR CHEST PA LAT 2V  September 22, 2019?  COMPARISON: KUB December 17, 2018  ?  History: sob  ?  FINDINGS:  ?  Lungs: Clear  Heart/aorta: Normal heart size. The thoracic aorta is mildly calcified, tortuous and ectatic.  Adenopathy: None  Pleura: No effusion  Bones and Chest wall: No acute bony or body wall  findings. Osteopenia and bony maturational changes. Right upper quadrant cholecystectomy clips stable from October 2020  ?  ?  IMPRESSION:  ?  No acute findings or abnormalities related to provided history.    ASSESSMENT and PLAN     Anakaren Campion is a 85 y.o. female who presents today for *follow up**.          #HTN--Increased nifedipine CR 60 mg daily. Continue losartan 25 mg.    May need uptitration of losartan.  Did not take meds today.  #Aortic Calcification--noted on chest imaging     September 22, 2019?       --Not Worsening.  Chronic.  Will monitor and treat underlying risk factors with medical and lifestyle interventions as warranted by balance between benefits and burdens.  #allergic rhinitis--trial flonase  #Macrocytosis--  #Deconditioning--initiate home PT  #GERD--s/p Nisan fundoplication  #COPD--mixed--per PFT's from [name].--consider reinitiation of inhalers.  #s/p rectocele repair  #s/p cholecystectomy  #Immunosupressed status--medication related.  Chronic. CTM  #Nocturnal hypoxemia--presumably related to MG crisis but has history of COPD. ?Will order nocturnal home O2 study and consider PFT's/Pulmonary evaluation once settled down. ?May need to restart BREO inhaler.  #Myasthenia Gravis--s/p plasmapheresis x5 October 2020, no Thymoma, on Azathioprine 150 daily and mestinon 60 tid.??Saw?Dr. Enedina Finner at [facility]. ?Was considering Soliris.??Ordering IVIG and MRI.  Encourage ongoing neurology follow up.   #IBS--titrate miralax to comfort. ?Consider duloxetine substitute for buproprion.  #Interstitial cystitis--on daily Trimethoprim 100 for UTI prophylaxis. ?Using bladder instillations prn. OFF topical estrogen due to advice from neurologist re: MG.  To see Dr. Lorenz Coaster.  Suspect recent flare related to breast CA re-diagnosis.  Doubt pseudomonas bactiuria is pathogen at present.  WIll complete current meropenum.  #Major Depression--active but appears better s/p  increase Duloxetine to 40 mg daily. Referred patient to Dr. Randie Heinz previously.  #Allergic rhinitis--previously on nasal steroids.  #Breast CA--Now with bx+.  To see [facility] surgery Friday. ?Patient declined surgery in 2019 and experienced dry mouth from Tamoxifen. ?Was considering  Anastrozole.Per records from [facility]:   ''07/20/2017 Screening detected right breast calcifications and distortion. The distortion was in the upper outer quadrant: Biopsy DCIS intermediate grade with CSL; calcifications UIQ: 2.8 cm: Biopsy DCIS+ ALH +CSL; 4 cm apart, axilla negative, Tis NX stage 0''  Await MRI to clarify surgical options.  #DM with CKD--stabe 3b--based on labs from [name]. ?FOllow here. ?If BP permits, may consider ARB BUT patient with h/o angioedema so will ONLY initiate this agent with great caution.  Refer to Ophthalmology.  #OA knees--previously getting hyaluronic acid injections.  #Anxiety  #Benzodiazepine dependence--back to 1.5 mg daily due to recent stress  #Fibromyalgia--duloxetine as above.   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  #Statin intolerance      FOLLOW-UP     RTC     Future Appointments   Date Time Provider Department Center   08/05/2020  9:30 AM Alvina Chou, AuD AUD [id number] AUDIOLOGY/SP   08/05/2020  1:00 PM NOHO MR02 3T MRINOHO DT [facility] RAD   08/10/2020  1:00 PM Velvet Bathe., MD Surg [place] [facility]   08/24/2020  3:00 PM NEU [facility]INIC [facility] NEUMUSC B200 NEUROLOGY   09/12/2020  3:00 PM Jasmine Awe., MD UROENDO GN56 UROLOGY   09/22/2020 11:00 AM York Ram., MD GERI IMS 420 MEDICINE         The above plan of care, diagnosis, orders, and follow-up were discussed with the patient and/or surrogate. Questions related to this recommended plan of care were answered.    ANALYSIS OF DATA (Needs to meet 1 category for moderate and 2 categories for high LOS)     I have:     Category 1 (Needs 3 for moderate and high LOS)     []  Reviewed []  1 []  2 []  ? 3 unique laboratory, radiology, and/or diagnostic tests noted below    Test/Study:  on date .    []  Reviewed []  1 []  2 []  ? 3 prior external notes and incorporated into patient assessment    I reviewed Dr. 's note in specialty  from date .    []  Discussed management or test interpretation with external provider(s) as noted      []  Ordered []  1 []  2 []  ? 3 unique laboratory, radiology, and/or diagnostic tests noted in A&P    []  Obtained history from independent historian:       Category 2  []  Independently interpreted the test    Category 3  []  Discussed management or test interpretation with external provider(s) as noted      INTERACTION COMPLEXITY and SOCIAL DETERMINANTS of HEALTH       PROBLEM COMPLEXITY   []  New problem with uncertain diagnosis or prognosis (moderate)   []  Multiple stable chronic problems (moderate)   []  Chronic problem not stable - not controlled, symptomatic, or worsening (moderate)   []  Severe exacerbation of chronic problem (high)   []  New or chronic problem that poses threat to life or bodily function (high)     MANAGEMENT COMPLEXITY   []  Old or external/outside records reviewed   []  Discussion with alternate (proxy) if patient with impaired communication / comprehension ability (e.g., dementia, aphasia, severe hearing loss).   []  Repeated questions (or disagreement) between patient and/among caregivers/family during the visit.  []  Caregiver/patient emotions/behavior/beliefs interfering with implementation of treatment plan.  []  Independent interpretation of test (EKG, Chest XRay)   []  Discussion of case with a consultant physician     RISK  LEVEL   []  Prescription drug management (moderate)   []  Minor surgery with CV risk factors or elective major surgery (moderate)   []  Dx or Rx significantly limited by SDoH (inadequate housing, living alone, poor health care access, inappropriate diet; low literacy) (moderate)   []  Major surgery - elective with CV risk factors or emergent (high)   []  Need for hospitalization (high)   []  New DNR or de-escalation of care (high)      SDoH  The diagnosis or treatment of said conditions is significantly limited by the following social determinants of health:  []  Z59.0 Homelessness  []  Z59.1 Inadequate housing  []  Z59.2 Discord with neighbors, lodgers and landlord  []  Z60.2 Problems related to living alone  []  Z59.8 Other problems related to housing and economic circumstances  []  Z59.4 Lack of adequate food and safe drinking water  []  Z59.6 Low income  []  Z59.7 Insufficient social insurance and welfare support  []  Z59.9 Problems related to housing and economic circumstances, unspecified  []  Z75.3 Unavailability and inaccessibility of health care facilities  []  Z75.4 Unavailability and inaccessibility of other helping agencies  []  Z72.4 Inappropriate diet and eating habits  []  Z62.820 Parent-biological child conflict  []  Z63.8 Other specified problems related to primary support group  []  Z55.0 Illiteracy and low level literacy   []  Z56.9 Unspecified problems related to employment        If Billing Based on Time:     I performed the following items on the day of service:    [x]  Preparing to see the patient (e.g., review of tests)  [x]  Obtaining and/or reviewing separately obtained history   [x]  Performing a medically appropriate examination and/or evaluation   [x]  Counseling and educating the patient/family/caregiver   [x]  Ordering medications, tests, or procedures  [x]  Referring and communicating with other healthcare professionals (when not separately reported)  [x]  Documenting clinical information in the EHR  []  Independently interpreting results and communicating results to patient/family/caregiver    I spent the following total amount of time on these tasks on the day of service:  New Patient     Established Patient  []  15-29 minutes - 99202    []  up to 9 minutes - 99211  []  30-44 minutes - 99203     []  10-19 minutes - 99212   []  45-59 minutes - 99204      []  20-29 minutes - 99213   []  60-74 minutes - 99205   []  30-39 minutes - 99214         [x]  40-55 minutes - 99215    []  I spent an additional ** * 15-minute-increment(s) for a total of * ** minutes on these tasks on the day of service. [phone] for each additional 15 minutes.)    Author: York Ram, MD 07/28/2020

## 2020-08-02 NOTE — Telephone Encounter
The patient participated in [facility] ([facility]) during her appointment with Dr. Grandville Silos  on 07/26/20.     Results were disclosed to patient, see progress note for details.

## 2020-08-02 NOTE — Telephone Encounter
Reply by: Choice Kleinsasser K. Rodriques Badie  Thank you!

## 2020-08-02 NOTE — Progress Notes
[name] had genetic testing coordinated through [facility] ([facility]); this does not include a complete hereditary cancer risk assessment that would be conducted in a traditional genetic counseling visit.    If the patient would like to discuss her genetic test report or family history in more detail, a Probation officer is available to meet with her. Patients can call [phone] to schedule an appointment.    These results were reviewed by Kaitlyn Spitz, MS, LCGC. They were discussed with the patient by [name], Genetic Counseling Assistant, via phone on 08/02/2020.    A copy of the genetic test results have been provided to the patient and a copy of the test results have been submitted to the patient's [facility] medical record    The result of this patient's genetic test results for inherited cancer predisposition genes, performed at [facility] (accession [id number]), is as follows:     No clinically significant variants, mutations, gross gene deletions or duplications were detected in the following 84 genes:  AIP, ALK, APC*, ATM*, AXIN2, BAP1, BARD1, BLM, BMPR1A, BRCA1, BRCA2, BRIP1, CASR, CDC73, CDH1, CDK4, CDKN1B, CDKN1C, CDKN2A (p14ARF), CDKN2A (p16INK4a), CEBPA, CHEK2, CTNNA1, DICER1*, DIS3L2, EGFR, EPCAM*, FH*, FLCN, GATA2, GPC3*, GREM1*, HOXB13, HRAS, KIT, MAX*, MEN1*, MET*, MITF*, MLH1*, MSH2*, MSH3*, MSH6*, MUTYH, NBN, NF1*, NF2, NTHL1, PALB2, PDGFRA, PHOX2B*, PMS2*, POLD1*, POLE, POT1, PRKAR1A, PTCH1, PTEN*, RAD50, RAD51C, RAD51D, RB1*, RECQL4*, RET, RUNX1, SDHA*, SDHAF2, SDHB, SDHC*, SDHD, SMAD4, SMARCA4, SMARCB1, SMARCE1, STK11, SUFU, TERC, TERT, TMEM127, TP53, TSC1*, TSC2, VHL, WRN*, WT1    Astericks indicate targeted or limited testing. Please refer to the actual genetic test results for technical information.    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 Variants of uncertain (or unknown) significance (VUS) are non-diagnostic and non-medically actionable.  ? The lab will continue to review internal and external scientific data and may be able to reclassify this variant as either benign or disease causing over time.

## 2020-08-03 ENCOUNTER — Ambulatory Visit: Payer: BLUE CROSS/BLUE SHIELD

## 2020-08-04 NOTE — Consults
Hearing Aid Check    Nashay Brickley was fitted with a hearing aid for the right ear. Abiageal Gavel?was fitted with a [employer] device on 05/23/2019 and returned the device on 07/04/2019 due to lack of benefit. Jossalyn was seen today for ongoing management of hearing aids.   ?  Hearing Aid:  Manufacturer: [employer]onak  Model: Audeo P70  Right SN: [id number]  Speaker Length & Power: 2S  Dome Size: Large power; changed from Medium vented  Battery Size: 312  Initial HAF: 07/22/2019  Hearing Aid Warranty Expiration: 10/06/2022  Right to Return Expiration Date: 09/05/2019    Accessories:   TV connector  ? SN: [id number]  Warranty: n/a; received as part of promotion     Patient reports:   ? Background noise is too loud; discontinued use of the hearing aid all together.   ? Having significant difficulties hearing people and is asking for ''what'' more often.     Actions taken:  ? Datalogging: 0.1 hrs/day  ? Counseled extensively regarding realistic expectations for dining room given limitations from hearing loss and acoustic of the environment.   ? Several programming changes were made to the hearing aid and speech in noise was trialed in office without improvement. Patient was informed that lowering the volume of the hearing aid will inherently lower all sounds and therefore making hearing/understanding speakers of interest harder as well; patient understood.   ? Counseled patient on full-time hearing aid use and the need for readjustment to the hearing aids. Patient was informed that discontinued use of the hearing aids will cause all sounds to be louder than used to when trying to where the hearing aid again. Patient expressed understanding and left wearing the hearing aid.   ? Patient was encouraged to wear hearing aid even in quiet and return in a few weeks for a hearing aid check when her schedule permits; patient agreed.   ? Programming changes:   ? Gain set to 85%   ? Decreased lows/mid 6 clicks  ? Increased strength of sound relax, noise block, and soft noise reduction in speech in noise and comfort in noise programs - increased to the strongest/most aggressive settings.     Follow-up Recommendations:  Regular daily use of hearing aid(s) was encouraged.  Return as needed.      Alvina Chou, AuD, 08/05/2020   Senior Audiologist           Education:  Topic: background noise, Realistic expectations, Communication strategies and Hearing aid(s)  Person Taught: patient  Barrier: Motivation  Needs: Knows well  Teaching Method: Demonstration and Verbal instruction    Outcome: Able to repeat information and Able to demonstrate what was taught

## 2020-08-05 ENCOUNTER — Inpatient Hospital Stay: Payer: BLUE CROSS/BLUE SHIELD

## 2020-08-05 ENCOUNTER — Ambulatory Visit: Payer: BLUE CROSS/BLUE SHIELD | Attending: Audiologist

## 2020-08-05 DIAGNOSIS — C50912 Malignant neoplasm of unspecified site of left female breast: Secondary | ICD-10-CM

## 2020-08-05 DIAGNOSIS — C50911 Malignant neoplasm of unspecified site of right female breast: Secondary | ICD-10-CM

## 2020-08-05 DIAGNOSIS — Z170 Estrogen receptor positive status [ER+]: Secondary | ICD-10-CM

## 2020-08-05 DIAGNOSIS — H903 Sensorineural hearing loss, bilateral: Secondary | ICD-10-CM

## 2020-08-05 MED ADMIN — GADOBUTROL 1 MMOL/ML IV SOLN: 7.5 mL | INTRAVENOUS | @ 21:00:00 | Stop: 2020-08-05 | NDC 50419032511

## 2020-08-06 DIAGNOSIS — Z170 Estrogen receptor positive status [ER+]: Secondary | ICD-10-CM

## 2020-08-06 DIAGNOSIS — C50911 Malignant neoplasm of unspecified site of right female breast: Secondary | ICD-10-CM

## 2020-08-06 DIAGNOSIS — C50912 Malignant neoplasm of unspecified site of left female breast: Secondary | ICD-10-CM

## 2020-08-07 NOTE — Progress Notes
[facility]    Patient: [name]  MRN: [id number]  DOB: 02/20/1936    Date of Service: 08/10/2020    Patient Care Team:  York Ram., MD as PCP - General (Medicine, Geriatric Medicine)  Referring Provider: Dr. Brayton El    Reason for Visit: Bilateral breast cancer    HPI:  [name] is a 85 y.o. 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 She elected not to have surgery at that time. She took tamoxifen-->anastrozole for a short period of time, but discontinued endocrine therapy due to side effects.     [name] then noted left breast pain in April 2022. Subsequently, she underwent a diagnostic bilateral breast imaging work-up in April to May 2022 at [facility] that revealed heterogeneously dense breast tissue and the following findings:    Right breast:  1. A mass with spiculated margins and associated coil biopsy marker clip in the upper outer quadrant of the right breast at anterior depth-->biopsy-proven DCIS in 2019.    2. A solid mass with indistinct margins measuring 14 x 4 x 10 mm in the right breast at 1 o'clock located 6 centimeters from the nipple. There is associated microclip. This correlates with the mammographic focal asymmetry the X clip-->biopsy proven DCIS from 2019.     3. Adjacent area of architectural distortion in the right breast upper inner quadrant at posterior depth, slightly more posterior than the X microclip. Ultrasound demonstrates a not parallel solid mass with irregular margins measuring 8 x 4 x 5 mm seen in the right breast at 12 o'clock, upper outer quadrant located 6 centimeters from the nipple. This may correspond with mammographic architectural distortion-->invasive ductal carcinoma with lobular and cribriform features, grade 2, ER+, PR+, Her-2 1+ on IHC and negative on FISH, Ki-67 5%.     4. Grouped calcifications measuring 6 mm seen in the upper outer quadrant of the right breast at posterior depth located 5 centimeters from the nipple. Pending results of biopsy of #3.     5. There is an oval mass measuring 4 x 3 x 2 mm seen in the right breast at 12 o'clock, upper outer quadrant located 6 centimeters from the nipple. Probably benign.      6. There is a solid mass with indistinct margins measuring 5 x 3 x 2 mm seen in the right breast at 2 o'clock, upper inner quadrant located 4 centimeters from the nipple. Probably benign.      Left breast:  1. An irregular mass with angular margins measuring 10 x 9 x 9 mm seen in the left breast at 11 o'clock located 5 centimeters from the nipple. This corresponds to the mammographic and palpable finding-->invasive ductal carcinoma with lobular, grade 2, ER+, PR-, Her-2 2+ on IHC and pending on FISH, Ki-67 10%.    2. A vague hypoechoic area measuring 8 x 5 x 6 mm seen in the left breast at 12 o'clock, upper outer quadrant located 3 centimeters from the nipple. Probably benign.      3. A solid mass measuring 6 x 3 x 5 mm seen in the left breast at 1 o'clock located 1 centimeter from the nipple. Probably benign.      4. There is an axillary lymph node in the left axilla with cortical thickness measuring up to 6 mm.    [name] had recent MRI performed  that redemonstrated multifocal right breast malignancy and left breast IDC with an additional 4.3cm of NME that spanned nearly the entire left breast that appears suspicious. Recent genetic testing was negative for pathogenic mutations. She presents for surgical planning.    G2P2  Menopausal status: Postmenopausal  Hormone therapy use: None  Ashkenazi Jewish: No    Past Medical History:   Diagnosis Date   ? Breast cancer (HCC/RAF) 12/20/2018     No past surgical history on file.  Current Outpatient Medications   Medication Sig   ? Albuterol Sulfate AEPB Takes 1 to 2 puffs every 4 hours as needed for wheezing or SOB Inhale Takes 1 to 2 puffs every 4 hours as needed for wheezing or SOB ..   ? ascorbic acid 500 mg tablet Take 1 tablet (500 mg total) by mouth two (2) times daily with meals.   ? azaTHIOprine 50 mg tablet Take one and one-half tablet (75 mg total) by mouth two (2) times daily.   ? buPROPion, SR, (WELLBUTRIN SR) 200 mg 12 hr tablet Take 1 tablet (200 mg total) by mouth two (2) times daily.   ? clonazePAM 1 mg tablet 0.5 mg in am and 1 mg in pm.   ? DEXILANT 60 MG DR capsule    ? DULoxetine 20 mg DR capsule Take 2 capsules (40 mg total) by mouth daily.   ? fluocinonide 0.05% external solution    ? fluticasone 50 mcg/act nasal spray Spray 1 spray by nasal route daily.   ? fluticasone-vilanterol (BREO ELLIPTA) 100-25 mcg/inh inhaler Inhale 1 puff daily.   ? GAMMAGARD 30 GM/300ML SOLN every twenty one (21) days .   ? losartan 25 mg tablet Take 1 tablet (25 mg total) by mouth daily.   ? methenamine hippurate 1 g tablet Take 1 tablet (1 g total) by mouth two (2) times daily.   ? montelukast 10 mg tablet Take 10 mg by mouth as needed for.   ? pyridostigmine 60 mg tablet Take 1 tablet (60 mg total) by mouth three (3) times daily as needed.   ? trimethoprim 100 mg tablet Take 1 tablet (100 mg total) by mouth daily.     No current facility-administered medications for this visit.     Facility-Administered Medications Ordered in Other Visits   Medication Dose Route Frequency   ? lidocaine 2% inj 5 mL  5 mL Intradermal Once   ? lidocaine 2% inj 5 mL  5 mL Intradermal Once   ? lidocaine-EPINEPHrine 1 %-1:100000 inj 4.8 mL  4.8 mL Intradermal Once   ? lidocaine-EPINEPHrine 1 %-1:100000 inj 5 mL  5 mL Intradermal Once     Allergies   Allergen Reactions   ? Beta Adrenergic Blockers Other (See Comments)     Avoid d/t Myasthenia Gravis  Avoid d/t Myasthenia Gravis     ? Levofloxacin Other (See Comments)     Flare of Myasthenia Gravis   ? Macrolides And Ketolides Other (See Comments)     Avoid d/t Myasthenia Gravis  Use with caution d/t Myasthenia Gravis  Use with caution d/t Myasthenia Gravis     ? Sulfa Antibiotics Other (See Comments)     May possibly have caused deafness in one ear  May possibly have caused deafness in one ear  other     ? Botulinum Toxins Other (See Comments)     Muscle weakness r/t Myasthenia Gravis  Muscle weakness r/t Myasthenia Gravis     ? Statins Other (See Comments)  Muscle aches  Muscle aches  Muscle aches     ? Plastic Tape Itching and Other (See Comments)     Turns the skin red where applied     No family history on file.  Social History     Tobacco Use   ? Smoking status: Former Smoker     Quit date: 1985     Years since quitting: 37.4   ? Smokeless tobacco: Former Estate agent Use Topics   ? Alcohol use: Not Currently   ? Drug use: Never      Physical Examination:  Vitals: BP 137/63  ~ Pulse 97  ~ Temp 36.2 ?C (97.1 ?F) (Forehead)  ~ Resp 17  ~ Ht 5' 5'' (1.651 m)  ~ Wt 148 lb 11.2 oz (67.4 kg)  ~ SpO2 96%  ~ BMI 24.74 kg/m?    General: Alert, no apparent distress.  Breast inspection: The patient is examined in the seated upright and supine positions. No visible masses, skin fixations, areas of skin edema or erythema, nipple/areolar lesions, or nipple retractions.  Breast examination: Mild diffuse fibroglandular nodularity. No dominant or distinct masses, skin fixations, nipple discharge or areas of focal tenderness bilaterally.  Abdomen: Soft, non-tender, no hepatosplenomegaly or masses appreciated.   Lymph Nodes: No cervical, supraclavicular, or axillary adenopathy bilaterally.    Data Review (I personally reviewed all outside records, imaging studies and pathology reports):   Imaging:   See [place] imaging.    Pathology:  FINAL DIAGNOSIS   Date Value Ref Range Status   07/13/2020   Final      A. 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  BREAST, LEFT, MASS, 11:00, 5 CM FROM NIPPLE (NEEDLE CORE BIOPSY):  - Invasive ductal carcinoma with lobular features, grade 2 (50% of biopsy; longest involved segment 6 mm)   Modified Bloom and Richardson score: 6 of 9    Tubule formation:   3    Nuclear pleomorphism: 2    Mitotic score:   1  - Ductal carcinoma in situ (DCIS) is not identified  - Breast biomarkers: See immunohistochemistry report below  - HER2 FISH: Pending, will be reported separately    COMMENT: The diagnosis was communicated to Dr. Babs Sciara and Dr. Glennis Brink via [facility] message on 07/14/2020 at 11:40 AM.       IHC REPORT   Date Value Ref Range Status   07/13/2020   Final      BLOCK: A1   FIXATIVE: Formalin    ANTIBODY/PROBE: RESULT/COMMENT  P63   Positive in myoepithelial cells  SMMHC  Positive in myoepithelial cells  Ecadherin  Positive    BLOCK: B1   FIXATIVE: Formalin    ANTIBODY/PROBE: RESULT/COMMENT  Ecadherin  Positive    INTERPRETATION:  See final diagnosis.    Note: The immunoperoxidase stain reported above was developed and its performance characteristics determined by [facility].  It has not been cleared or approved  by the U.S. Food and Drug Administration, although such approval is not required for analyte-specific reagents of this type.  Appropriate controls are included for each case.      BREAST BIOMARKER REPORT    BLOCK:  A1  FIXATIVE:  Formalin     RESULT ER/PR:   ESTROGEN   RECEPTOR PROGESTERONE RECEPTOR   Antibody Clone SP1 Clone 636   %Tumor Staining >95% >95%   Intensity (1+ to 3+) 3+ 3+   Leica Bond III with Refine Polymer Detection System, using heat retrieval for 20 minutes with pH6 buffer. Clone SP1 diluted to 1/50 and PR636 to 1/200.    ESTROGEN/PROGESTERONE IMMUNOHISTOCHEMICAL REPORT  Using appropriate positive and negative controls, the test for the presence of these hormone receptor proteins is performed by the immunoperoxidase method, and reported according to the 2009 CAP-ASCO Guidelines for Hormone Receptor testing. Tissue is fixed from 6-72 hours in 10% neutral buffered formalin. A positive ER or PR tumor shows greater than or equal to 1 percent of cells staining, and results are semi-quantitated as indicated above.      RESULT HER-2/neu IHC assay (utilizing FDA-approved DAKO Hercep Test): Arch Pathol Lab Med 2018:1-20, ASCO/CAP HER2 Testing in Breast Cancer Update - Veronia Beets al    Test Score: 1+    HER2/neu:  Negative    0 No staining is observed OR membrane staining that is incomplete and is faint/barely perceptible and in ? 10% of tumor cells Negative   1+ Incomplete membrane staining that is faint/barely perceptible and in   > 10% of tumor cells Negative     2+ Weak to moderate complete membrane staining observed in > 10% of tumor cells  Equivocal   3+ Circumferential membrane staining that is complete, intense, and in   > 10% of tumor cells Positive     Note:  FISH gene amplification testing is pending. Please see separate report.      RESULT Ki-67 (Clone MIB1):  5%    Note: The immunoperoxidase stains reported above for ER, PR, and Ki-67 were developed and their performance characteristics determined by Department of Pathology & Laboratory Medicine, [place] [facility] [place], [place]. They have not been cleared or approved by the U.S. Food and Drug Administration, although such approval is not required for analyte-specific reagents of this type.  Decalcification may adversely affect patient results.  The HER2/neu, ER and PR assays have not been validated on decalcified tissues.  If the tissue is indicated to have been decalcified, results should be interpreted with caution given the possibility of false negative results on decalcified specimens.      BREAST BIOMARKER REPORT    BLOCK:  B1  FIXATIVE:  Formalin     RESULT ER/PR:   ESTROGEN   RECEPTOR PROGESTERONE RECEPTOR   Antibody Clone SP1 Clone 636   %Tumor Staining >95% 0%   Intensity (1+ to 3+) 3+ NA   Leica Bond III with Refine Polymer Detection System, using heat retrieval for 20 minutes with pH6 buffer. Clone SP1 diluted to 1/50 and PR636 to 1/200.    ESTROGEN/PROGESTERONE IMMUNOHISTOCHEMICAL REPORT  Using appropriate positive and negative controls, the test for the presence of these hormone receptor proteins is performed by the immunoperoxidase method, and reported according to the 2009 CAP-ASCO Guidelines for Hormone Receptor testing. Tissue is fixed from 6-72 hours in 10% neutral buffered formalin. A positive ER or PR tumor shows greater than or equal to 1 percent of cells staining, and results are semi-quantitated as  indicated above.      RESULT HER-2/neu IHC assay (utilizing FDA-approved DAKO Hercep Test): Arch Pathol Lab Med 2018:1-20, ASCO/CAP HER2 Testing in Breast Cancer Update - Veronia Beets al    Test Score: 2+    HER2/neu:  Equivocal    0 No staining is observed OR membrane staining that is incomplete and is faint/barely perceptible and in ? 10% of tumor cells Negative   1+ Incomplete membrane staining that is faint/barely perceptible and in   > 10% of tumor cells Negative     2+ Weak to moderate complete membrane staining observed in > 10% of tumor cells  Equivocal   3+ Circumferential membrane staining that is complete, intense, and in   > 10% of tumor cells Positive     Note:  FISH gene amplification testing is pending. Please see separate report.      RESULT Ki-67 (Clone MIB1):  10%    Note: The immunoperoxidase stains reported above for ER, PR, and Ki-67 were developed and their performance characteristics determined by Department of Pathology & Laboratory Medicine, [place] [facility] [place], [place]. They have not been cleared or approved by the U.S. Food and Drug Administration, although such approval is not required for analyte-specific reagents of this type.  Decalcification may adversely affect patient results.  The HER2/neu, ER and PR assays have not been validated on decalcified tissues.  If the tissue is indicated to have been decalcified, results should be interpreted with caution given the possibility of false negative results on decalcified specimens.           Impression:  [name] is a 85 y.o. woman with multifocal IDC/DCIS in the right breast and IDC in the left breast.    [name] has what appears to be multifocal/extensive disease in both breasts. Therefore, I am recommending a bilateral mastectomy. In light of her age and comorbidities, she is concerned about her ability to get through the surgery and recovery period. I reassured her that I thought she would do well, but we also discussed the option of taking neoadjuvant (and possibly definitive) endocrine therapy instead of proceeding with surgery. [name] would like to meet with a medical oncologist to discuss this option further and then make a decision.     [name] understands everything we discussed, and all of her questions were answered.     Plan and Recommendations:  Referral to medical oncology.    Total face-to-face and non-face-to-face encounter time today = 30 minutes, which included chart preparation, review of relevant breast imaging studies and labs, review of primary care and oncology provider notes, examination, counseling, order entry, case discussion with multidisciplinary providers, documentation, and coordination of additional care.        Devon Pretty K. Janee Morn, M.D.  Assistant Professor of Surgery  [employer] at [facility]  [place] office: [phone]  [facility] office: [phone]

## 2020-08-09 ENCOUNTER — Telehealth: Payer: BLUE CROSS/BLUE SHIELD

## 2020-08-09 NOTE — Telephone Encounter
PDL Call to Practice    Reason for Call: Pt returning call to schedule    Appointment Related?  [] Yes  [x] No     If yes;  Date:  Time:    Call warm transferred to PDL: [x] Yes  [] No    Call Received by Practice Representative: Maricela

## 2020-08-09 NOTE — Telephone Encounter
Attempted to call patient in regards to appointment scheduled on June 29th, left a detailed VM informing patient appt will need to be rescheduled to either June 22nd or June 23rd - afternoon appt.     - Please check with coordinator on available times. Thank you

## 2020-08-10 ENCOUNTER — Ambulatory Visit: Payer: BLUE CROSS/BLUE SHIELD

## 2020-08-10 ENCOUNTER — Telehealth: Payer: BLUE CROSS/BLUE SHIELD

## 2020-08-10 NOTE — Telephone Encounter
Pt called through the [facility] line returning Coordinator Paola's call regarding rescheduling appt w/ Dr.Shieh. I went ahead and assisted pt w/ rescheduling appt for 06/22 @3pm  via in person.     No further action required.

## 2020-08-10 NOTE — Telephone Encounter
PDL Call to Practice    Reason for Call: Patient called to secure appointment on either day offered. Thank you.     Appointment Related?  [x]  Yes  []  No     If yes;  Date:  Time:    Call warm transferred to PDL: [x]  Yes  []  No    Call Received by Practice Representative: Araceli

## 2020-08-10 NOTE — Telephone Encounter
Hi team:  Patient walked in this afternoon after appt w/referring MD to get [facility] scheduled with Dr. Domenic Polite. See below for more details:    - Dx: breast cancer  - Referring MD: Dr. Cedric Fishman. Cecil pt's daughter, [place] Lopes to schedule at [phone]    Let me know if/how I can assist.    Thank you,  Amely Voorheis

## 2020-08-11 ENCOUNTER — Ambulatory Visit: Payer: BLUE CROSS/BLUE SHIELD

## 2020-08-11 ENCOUNTER — Telehealth: Payer: BLUE CROSS/BLUE SHIELD

## 2020-08-11 NOTE — Telephone Encounter
Called and LVM,please transfer to [place] at [phone].

## 2020-08-11 NOTE — Telephone Encounter
PDL Call to Practice    Reason for Call:Pt's Daughter Apolonio Schneiders is returning a call back from [place] to schedule new. Please advise.     Appointment Related?  [x]  Yes  []  No     If yes;  Date:TBD   Time:    Call warm transferred to PDL: [x]  Yes  []  No    Call Received by Practice Representative:Mitzi Hansen

## 2020-08-11 NOTE — Telephone Encounter
Accepted pdl call and offered next available.

## 2020-08-11 NOTE — Telephone Encounter
   Diagnosis-newly diagnosed or second opinion?  Breast Cancer      Type of insurance/Is auth needed? If so, status of auth?  Medicare A & B/An[employer] Supplement      Date/Time of when appt was scheduled  tbd     Referring MD/ph. Number  Dr. Grandville Silos, [place]/[id number]     Status of medical records  In [place]      Where new patient paperwork is supposed to be sent  In office      Any other pertinent information   Pt was originally referred to Dr. Domenic Polite in [facility] but availability did not work for pt. [place], pt's daughter, would like to schedule with Dr. Valerie Salts in [facility] and will have referral updated.     Cbn: [phone] Apolonio Schneiders, pt's daughter)    Thank Dennis Bast

## 2020-08-12 ENCOUNTER — Ambulatory Visit: Payer: BLUE CROSS/BLUE SHIELD

## 2020-08-12 NOTE — Telephone Encounter
Patient will see Dr. Valerie Salts in [facility]

## 2020-08-12 NOTE — Addendum Note
Addended by: Margarito Courser on: 08/12/2020 11:07 AM     Modules accepted: Orders

## 2020-08-16 ENCOUNTER — Ambulatory Visit: Payer: BLUE CROSS/BLUE SHIELD

## 2020-08-16 NOTE — Progress Notes
HEMATOLOGY/ONCOLOGY OUTPATIENT NOTE    PATIENT: [name]  MRN: [id number]  DOB: 03-Nov-1935  DATE OF SERVICE: 08/17/2020  Referring MD: York Ram., MD    Reason for Consultation/ Chief Complaint:  Bilateral breast cancer    HPI   [name] is a 85 y.o. female with PMH including myasthenia gravis, asymmetric hearing loss, fibromyalgia, DM with CKD, major depression, interstitial cystitis, IBS, COPD who presents to establish care for breast cancer.     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 07/19/2017 Screening detected right breast calcifications and distortion in the upper outer quadrant: Biopsy DCIS+ ALH +CSL; 4 cm apart, axilla negative.    Neoadjuvant tamoxifen started 10/30/17, stopped after 4-6 weeks due to dry mouth and mouth sores. Switched to anastrozole 01/29/18, discontinued because it made her throat dry. Declined additional antiestrogen therapy.   ?  Ms. Delancey then noted left breast pain in April 2022.     Underwent a diagnostic bilateral breast imaging 06/13/2020 which revealed:   ?- Right breast: solid mass with indistinct margins measuring 14 x 4 x 10 mm in the right breast at 1 o'clock located 6 centimeters from the nipple with associated microclip,  not parallel solid mass with irregular margins measuring 8 x 4 x 5 mm seen in the right breast at 12 o'clock, upper outer quadrant located 6 centimeters from the nipple.   ?- Left breast: irregular mass with angular margins measuring 10 x 9 x 9 mm seen in the left breast at 11 o'clock located 5 centimeters from the nipple, axillary lymph node in the left axilla with cortical thickness measuring up to 6 mm.    R breast biopsy at 12:00, 6 cm from nipple on 07/13/2020 revealed invasive ductal carcinoma with lobular and focal cribriform features, grade 2 ER (+) PR (+) HER2 (-), Ki-67 5%    L breast biopsy at 11:00, 5 cm from nipple on 07/13/2020 revealed invasive ductal carcinoma with lobular features, grade 2 ER (-) PR (-) HER2 (-), Ki-67 10%  ?  Breast MRI performed on 08/05/2020 that redemonstrated multifocal right breast malignancy and left breast IDC with an additional 4.3cm of NME that spanned nearly the entire left breast that appears suspicious.     [facility] genetic testing (-) for pathogenic variants.     08/17/2020 Patient presents to establish care for breast cancer.          ECOG performance status: 0  PAST HISTORY   PMH:  Patient Active Problem List    Diagnosis Date Noted   ? Immunosuppressed status (HCC/RAF) 03/21/2020   ? HTN (hypertension) 10/31/2019   ? Aortic calcification (HCC/RAF) 09/25/2019   ? Refractive error 04/28/2019   ? Ptosis of eyelid 04/28/2019   ? Pseudophakia of both eyes 04/28/2019   ? Benzodiazepine dependence (HCC/RAF) 12/20/2018   ? Breast cancer (HCC/RAF) 12/20/2018   ? Spondylosis without myelopathy or radiculopathy, lumbar region 11/28/2018   ? Anxiety 11/27/2018   ? CKD (chronic kidney disease), stage III (HCC/RAF) 11/27/2018   ? Nocturnal hypoxemia 04/07/2018     Last Assessment & Plan:   We discussed potential impact of hypoxemia from her COPD on other body function  Plan-overnight oximetry     ? Major depression, recurrent, chronic (HCC/RAF) 12/16/2017   ? Diabetes mellitus (HCC/RAF) 12/16/2017   ? Osteoarthritis of knee 05/17/2017   ? H/O arthroscopy 03/06/2017   ? Myasthenia gravis with exacerbation (HCC/RAF) 12/18/2016   ?  Vaginal atrophy 08/16/2016   ? Acute medial meniscal tear, left, subsequent encounter 07/18/2016   ? Renal cyst 03/12/2016   ? Asthmatic bronchitis, mild persistent, with acute exacerbation 12/29/2015     Last Assessment & Plan:   Continue rescue inhaler as needed. Try adding sample Bevespi maintenance inhaler to see if this helps.  She is currently on prednisone 20 mg daily maintenance and I don't think adding a steroid inhaler component will make any difference.     ? Urethral caruncle 07/26/2015   ? Chronic cholecystitis 01/14/2015   ? Angioedema 05/23/2014     Attributed to injected methylprednisolone    Last Assessment & Plan:   We discussed limited ability to test for possible triggers. We are asking our labs sources about ability to test against methylprednisolone for injection.  A different brand might be worth trying. She can try pre-medicating with an antihistamine.     ? Chronic interstitial cystitis 10/01/2011   ? Insomnia 05/31/2007     Qualifier: Diagnosis of   By: Maple Hudson MD, Clinton D    Last Assessment & Plan:   I'm concerned that some of her sleep problems may reflect obstructive sleep apnea based on her palate and which she can tell me about her sleep patterns without a witness. We also want to know about oxygenation during sleep.  Plan-schedule sleep study  Qualifier: Diagnosis of   By: Maple Hudson MD, Clinton D      Last Assessment & Plan:   I'm concerned that some of her sleep problems may reflect obstructive sleep apnea based on her palate and which she can tell me about her sleep patterns without a witness. We also want to know about oxygenation during sleep.  Plan-schedule sleep study     ? COPD mixed type (HCC/RAF) 04/30/2007     Office Spirometry 03/16/2016-severe obstructive airways disease FVC 1.59/59%, FEV1 0.98/49%, ratio 0.62, FEF 25-75% 0.42/28%  PFT 04/16/16-severe obstructive airways disease with slight response to bronchodilator, severe diffusion defect. FEV1/FVC 0.64, DLCO 45%    Last Assessment & Plan:   Severe obstructive airways disease. She can't hear herself wheeze.  Plan-try samples of Trelegy instead of Anoro for comparison    Overview:   Office Spirometry 03/16/2016-severe obstructive airways disease FVC 1.59/59%, FEV1 0.98/49%, ratio 0.62, FEF 25-75% 0.42/28%  PFT 04/16/16-severe obstructive airways disease with slight response to bronchodilator, severe diffusion defect. FEV1/FVC 0.64, DLCO 45%    Last Assessment & Plan:   Severe obstructive airways disease. She can't hear herself wheeze.  Plan-try samples of Trelegy instead of Anoro for comparison  Office Spirometry 03/16/2016-severe obstructive airways disease FVC 1.59/59%, FEV1 0.98/49%, ratio 0.62, FEF 25-75% 0.42/28%  PFT 04/16/16-severe obstructive airways disease with slight response to bronchodilator, severe diffusion defect. FEV1/FVC 0.64, DLCO 45%    Last Assessment & Plan:   Severe COPD but mainly emphysema with limited bronchospasm component that could be addressed with bronchodilators.  Plan-try sample Bevespi as a maintenance controller, refill pro-air     ? Esophageal reflux 04/30/2007     History of fundoplication     Last Assessment & Plan:   Emphasized continued attention to antireflux measures. Reminded that reflux can be a significant trigger for irritable airways, cough and wheeze.  History of fundoplication       Last Assessment & Plan:   Emphasized continued attention to antireflux measures. Reminded that reflux can be a significant trigger for irritable airways, cough and wheeze.     ? Eustachian tube dysfunction 04/30/2007  Annotation: decreased hearing  Qualifier: Diagnosis of   By: Yetta Barre CNA/MA, Jessica       Last Assessment & Plan:   She is nearly deaf on a chronic basis. Management is mostly by her ENT physicians.     ? Fibromyalgia 04/30/2007     Qualifier: Diagnosis of   By: Yetta Barre CNA/MA, Rose Fillers: Diagnosis of   By: Yetta Barre CNA/MA, Shanda Bumps     ? Seasonal and perennial allergic rhinitis 04/30/2007     Qualifier: Diagnosis of   By: Yetta Barre CNA/MA, Shanda Bumps     Last Assessment & Plan:   She is now on prednisone 20 mg daily. I suggested she wait until her myasthenia status is stabilized. Then if she needs to she couldn't seek evaluation at one of the allergy practices in town as discussed, since our vaccine program will be closing.  Qualifier: Diagnosis of   By: Yetta Barre CNA/MA, Jessica       Last Assessment & Plan:   She is now on prednisone 20 mg daily. I suggested she wait until her myasthenia status is stabilized. Then if she needs to she couldn't seek evaluation at one of the allergy practices in town as discussed, since our vaccine program will be closing.         Surgical History:  No past surgical history on file.    Gyn Hx:  G2P2  Menopausal status: Postmenopausal  Hormone therapy use: None      Family and Social History  No family history on file.  Social History     Tobacco Use   ? Smoking status: Former Smoker     Quit date: 1985     Years since quitting: 37.4   ? Smokeless tobacco: Former Estate agent Use Topics   ? Alcohol use: Not Currently       MEDS     No outpatient medications have been marked as taking for the 08/17/20 encounter (Appointment) with Fredda Hammed., MD.     Allergies   Allergen Reactions   ? Beta Adrenergic Blockers Other (See Comments)     Avoid d/t Myasthenia Gravis  Avoid d/t Myasthenia Gravis     ? Levofloxacin Other (See Comments)     Flare of Myasthenia Gravis   ? Macrolides And Ketolides Other (See Comments)     Avoid d/t Myasthenia Gravis  Use with caution d/t Myasthenia Gravis  Use with caution d/t Myasthenia Gravis     ? Sulfa Antibiotics Other (See Comments)     May possibly have caused deafness in one ear  May possibly have caused deafness in one ear  other     ? Botulinum Toxins Other (See Comments)     Muscle weakness r/t Myasthenia Gravis  Muscle weakness r/t Myasthenia Gravis     ? Statins Other (See Comments)     Muscle aches  Muscle aches  Muscle aches     ? Plastic Tape Itching and Other (See Comments)     Turns the skin red where applied       PHYSICAL EXAM   There were no vitals filed for this visit.   There is no height or weight on file to calculate BMI.  Wt Readings from Last 3 Encounters:   08/10/20 67.4 kg (148 lb 11.2 oz)   07/28/20 67.9 kg (149 lb 12.8 oz)   07/22/20 66.2 kg (146 lb)       Review of Systems:  Pertinent items are noted in HPI.  Physical Exam:  General: No acute distress. Appears well-developed, well-nourished and close to stated age.   Head: Normocephalic, atraumatic.  Eyes: Sclera anicteric. EOMI.  ENT: Hearing grossly normal bilaterally. Oropharynx is clear, mucus membranes are moist.  No oral ulcers noted. Good dentition.  Neck: Supple. Trachea midline.   Cardiac: Regular rate and rhythm. Normal S1, S2. No murmurs, rubs, or gallops.  Respiratory: Clear to auscultation bilaterally. No wheezes, rales, or rhonchi noted. Respiratory effort appears normal.   Abdomen: Soft, nontender and nondistended. Bowel sounds are present and normoactive. No organomegaly is appreciated.  Musculoskeletal: No edema. No cyanosis. Extremities are warm and well-perfused.   Extremities: Warm. No edema. No cyanosis.  Neurologic: Gait appears normal. Sensation intact to light touch in all four extremities. Oriented to person, place and time.   Hematologic: No bruising, purpura or petechiae are noted.   Dermatologic: Skin intact.  No rashes appreciated.   Lymphatic: No palpable cervical, supraclavicular, axillary or inguinal adenopathy appreciated.   Psychiatric: Affect appropriate.  Pleasant and conversant.   Breast exam:    Recent Labs:  Lab Results   Component Value Date    WBC 6.10 03/23/2020    HGB 12.7 03/23/2020    HCT 38.0 03/23/2020    MCV 97.9 03/23/2020    PLT 418 (H) 03/23/2020      Lab Results   Component Value Date    NA 136 06/09/2020    K 4.0 06/09/2020    CL 102 06/09/2020    CO2 22 06/09/2020    CREAT 1.63 (H) 06/09/2020    BUN 23 (H) 06/09/2020     Lab Results   Component Value Date    ALT 20 03/23/2020    AST 28 03/23/2020    ALKPHOS 80 03/23/2020    BILITOT 0.3 03/23/2020     Lab Results   Component Value Date    CALCIUM 9.4 06/09/2020       Pertinent imaging:  08/05/2020 MRI breast  1. Right breast:   a) Linear nonmass enhancement measuring 13 mm at 11:00, 5 cm deep to the nipple corresponds to the suspicious architectural distortion seen mammographically. This likely represents the same process as the known malignancy marked by the X microclip from   outside biopsy, which lies 13 mm anterior to this enhancement. Surgical and oncologic management is recommended. BI-RADS CATEGORY 6.  b) Irregular mass containing a microclip measuring 10 mm at 12:00, 6 cm deep to the nipple is a known malignancy. Surgical and oncologic management is recommended. BI-RADS CATEGORY 6.  c) Focal nonmass enhancement containing a microclip from a biopsy performed outside facility measuring 16 mm at 9:00, 2 cm deep to the nipple is a known malignancy. Surgical and oncologic management is recommended. BI-RADS CATEGORY 6.  ?  2. Left breast:   a) Irregular mass containing a microclip measuring 15 mm at 11:00, 5 cm deep to the nipple is a known malignancy. Surgical and oncologic management is recommended. BI-RADS CATEGORY 6.  b) Nonmass enhancement measuring 43 mm throughout the majority of the left breast is suspicious. If breast conservation is a consideration recommend MRI guided biopsy of the anterior-inferior aspect of the nonmass enhancement. BI-RADS CATEGORY 4  c) Oval enhancing mass measuring 5 mm with suggestion of a fatty hilum lies at 4:00, 7 cm deep to the nipple is probably benign and likely represents an intramammary lymph node. Recommend second look ultrasound if there is no sonographic correlate then   short-term MRI follow-up in 6  months is recommended. BI-RADS CATEGORY 3.  d) No MRI correlate for prominent axillary lymph node seen on prior ultrasound. Management as detailed on prior ultrasound report is recommended. BI-RADS CATEGORY 2.  ?  OVERALL ASSESSMENT: BI-RADS CATEGORY 4     06/13/2020 Mammogram/US bilat breasts, diagnostic  IMPRESSION:  Right breast:  ?  1. Mammographic architectural distortion/sonographic mass at 12:00 6 cm from nipple are suspicious.  Recommend ultrasound-guided biopsy.     2. Two biopsied areas in the right breast are known DCIS and ADH. Appropriate action should be taken.     3. Calcifications in the upper outer quadrant of the right breast at  posterior depth located 5 centimeters from the nipple are suspicious. Await results from biopsy at  site of distortion/12:00.     4. Additional solid masses in the right breast at 12:00 and 2 o'clock are probably benign.  Sonographic follow-up in 6 months is recommended tentatively.     Left breast:  1. Irregular mass in the left breast at 11 o'clock is highly suggestive  of malignancy. An ultrasound guided biopsy is recommended.     2. Vague hypoechoic area in the left breast at 12 o'clock, upper outer  quadrant is probably benign. Sonographic follow-up in 6 months is recommended.     3. Solid mass in the left breast at 1 o'clock is probably benign.  Sonographic follow-up in 6 months is recommended.     4. There is a lymph node in the left axilla with cortical        thickness measuring up to 6 mm. Surgical consultation is recommended.     The patient was notified about the results by Dr. Garlon Hatchet before leaving the facility, in the  presence of a chaperone. Scheduling and informational  regarding the two sites of biopsy were given to the patient.Additionally, a CC telephone message  regarding the result was sent to Dr. Brayton El at the end of the exam.     The patient has a history of breast cancer. The Tyrer-Cuzick risk model does not apply.     BI-RADS Category 5:  Highly Suggestive for Malignancy    09/22/2019 XR chest  No acute findings or abnormalities related to provided history.    06/19/2019 DXA lumbar spine/hip  Region ? ? ? ? ? ? ? ? ? BMD ? ?T-score ?Z-score ? Classification   -----------------------------------------------------------------   AP Spine(L1-L4) ? ? ? ? ?1.087 ? ?0.4 ? ? ?3.1 ? ? ? Normal   Femoral Neck (Left) ? ? ?0.696 ? -1.4 ? ? ?1.0 ? ? ? Osteopenia   Total Hip (Left) ? ? ? ? 0.744 ? -1.6 ? ? ?0.5 ? ? ? Osteopenia   -----------------------------------------------------------------     06/19/2019 US pelvis  Small subserosal uterine fundal fibroid. Otherwise normal postmenopausal pelvic ultrasound.    08/15/2018 Outside Mammogram      02/10/2018 Outside R Mammogram      07/17/2017 Outside R diagnostic mammogram/US    07/02/2017 Outside mammogram          Pertinent pathology:  08/02/2020 [facility] genetics      07/13/2020 Tissue exam  A. BREAST, RIGHT, MASS, 12:00, 6 CM FROM NIPPLE (NEEDLE CORE BIOPSY):  - Invasive ductal carcinoma with lobular and focal cribriform features, grade 2 (50% of biopsy; longest segment 4 mm)             Modified Bloom and Richardson score: 6 of 9  Tubule formation:                3                        Nuclear pleomorphism:       2                        Mitotic score:                       1  - Ductal carcinoma in situ (DCIS), low nuclear grade, cribriform type with focal central necrosis  - Breast biomarkers: See below  - ERBB2 (HER2) FISH: Negative for HER2 gene amplification   ?  B.  BREAST, LEFT, MASS, 11:00, 5 CM FROM NIPPLE (NEEDLE CORE BIOPSY):  - Invasive ductal carcinoma with lobular features, grade 2 (50% of biopsy; longest involved segment 6 mm)             Modified Bloom and Richardson score: 6 of 9                        Tubule formation:                3                        Nuclear pleomorphism:       2                        Mitotic score:                       1  - Ductal carcinoma in situ (DCIS) is not identified  - Breast biomarkers: See immunohistochemistry report below  - ERBB2 (HER2) FISH: Negative [based on IHC (2+) and FISH, see comment].     COMMENT: It is uncertain whether patients with an average of >4.0 and <6.0 HER2?signals per cell and a HER2/CEP17 ratio of <2.0 benefit from HER2-targeted therapy in the absence of protein overexpression (IHC 3+). If the specimen test result is close to the [facility] ratio threshold for positive, there is a higher likelihood that repeat testing will result in different results by chance alone. Therefore, when [facility] results are not 3+ positive, it is recommended that the sample be considered negative without additional testing on the specimen.    BREAST BIOMARKER REPORT  ?  BLOCK:  A1  FIXATIVE:  Formalin   ?  RESULT ER/PR:  ? ESTROGEN   RECEPTOR PROGESTERONE RECEPTOR   Antibody Clone SP1 Clone 636   %Tumor Staining >95% >95%   Intensity (1+ to 3+) 3+ 3+   ?  RESULT HER-2/neu IHC assay (utilizing FDA-approved DAKO Hercep Test): Arch Pathol Lab Med [phone], ASCO/CAP HER2 Testing in Breast Cancer Update - Veronia Beets al  ?  Test Score: 1+  ?  HER2/neu:  Negative  ?  RESULT Ki-67 (Clone MIB1):  5%  ?  BREAST BIOMARKER REPORT  ?  BLOCK:  B1  FIXATIVE:  Formalin   ?  RESULT ER/PR:  ? ESTROGEN   RECEPTOR PROGESTERONE RECEPTOR   Antibody Clone SP1 Clone 636   %Tumor Staining >95% 0%   Intensity (1+ to 3+) 3+ NA   ?  RESULT HER-2/neu IHC assay (utilizing FDA-approved DAKO Hercep Test): Arch Pathol Lab Med [phone], ASCO/CAP  HER2 Testing in Breast Cancer Update - Veronia Beets al  ?  Test Score: 2+  ?  HER2/neu:  Equivocal  ?  RESULT Ki-67 (Clone MIB1):  10%    07/19/2017 Outside pathology        ASSESSMENT & PLAN   Janese Radabaugh is a 85 y.o. female presents for     1. 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 She elected not to have surgery at that time. She took tamoxifen-->anastrozole for a short period of time, but discontinued endocrine therapy due to side effects.   - R breast biopsy at 12:00, 6 cm from nipple on 07/13/2020 revealed invasive ductal carcinoma with lobular and focal cribriform features, grade 2 ER (+) PR (+) HER2 (-), Ki-67 5%  - L breast biopsy at 11:00, 5 cm from nipple on 07/13/2020 revealed invasive ductal carcinoma with lobular features, grade 2 ER (-) PR (-) HER2 (-), Ki-67 10%           DIAGNOSES/ORDERS addressed on 08/17/2020:  1. Breast cancer (HCC/RAF)      - on  - continue close monitoring for toxicity of intensive drug therapy.    NEW PROBLEMS AS OF 08/17/2020:     []  New minor or self-limited problem (99202/99212).  []  2+ minor or self-limited problem, stable chronic illness, acute uncomplicated illness/injury (99203/99213).  []  Chronic illness with exacerbation/progression/tx side effect; 2+ stable chronic illnesses, new problem with uncertain prognosis, acute illness with systemic symptoms, or acute complicated injury (99204/99214).  []  Chronic illness with severe exacerbation/progression/tx side effect, acute/chronic illness or injury that poses threat to life or bodily function (99205/99215).      REVIEW OF DATA   I have  [x]  Reviewed/ordered []  1 []  2 [x]  ? 3 unique laboratory, radiology, and/or diagnostic tests noted below  No orders of the defined types were placed in this encounter.    08/05/2020 MRI breast  06/13/2020 Mammogram/US bilat breasts, diagnostic  09/22/2019 XR chest  06/19/2019 DXA lumbar spine/hip  06/19/2019 US pelvis  08/15/2018 Outside Mammogram  02/10/2018 Outside R Mammogram  07/17/2017 Outside R diagnostic mammogram/US  07/02/2017 Outside mammogram  08/02/2020 [facility] genetics  07/13/2020 Tissue exam  07/19/2017 Outside pathology        [x]  Reviewed []  1 []  2 [x]  ? 3 prior external notes and incorporated into patient assessment   I reviewed Dr. Velvet Bathe, MD in Surgery, Surgical Oncology on 08/10/2020.  I reviewed Dr. York Ram, MD in Medicine, Internal Medicine on 07/13/2020.  I reviewed Dr. Johnney Killian. Desma Maxim, MD in Obstetrics & Gynecology on 05/07/2019.    []  Discussed management or test interpretation with external provider(s) on 08/17/2020 as noted:      RISK OF COMPLICATION   This writer has deemed the above diagnoses to have a risk of complication, morbidity or mortality of:   []  Minimal  []  Low  []  Moderate   []  due to prescription drug management   [] Decision re: minor surgery   [] Diagnosis or treatment limited by social determinants of health  []  Severe    []  due to intensive monitoring for chemotherapy toxicity   []  Decision elective surgery with risk factors  []  Decision regarding need for hospitalization  []  Advanced Care Planning decisions    SOCIAL DETERMINANTS OF HEALTH   []  The diagnosis or treatment of said conditions is significantly limited by social determinants of health:  I spent 50 minutes counseling and coordinating care for the items checked below on the day of service 08/17/2020  [x]  Preparing to see the patient (e.g., review of tests)  [x]  Obtaining and or reviewing separately obtained history   [x]  Performing a medically appropriate examination and/or evaluation   [x]  Counseling and educating the patient/family/caregiver   [x]  Ordering medications, tests, or procedures  []  Referring and communicating with other healthcare professionals (when not separately reported)  [x]  Documenting clinical information in the EHR  []  Independently interpreting results and communicating results to patient/ family/caregiver     No follow-ups on file.       The above recommendation were discussed with the patient. The patient has all questions answered satisfactorily and is in agreement with this recommended plan of care.    Scribe Signature:  I, Salley Scarlet, have scribed for [employer]. Oscar La, MD on this encounter with [name] at the [facility] in [place] on 08/17/2020 at 5:55 PM 08/17/2020.     Physician Signature:  I have reviewed this note, scribed by Salley Scarlet, I have edited and attest that it is an accurate representation of my evaluation.   Rhona Leavens. Oscar La, MD 08/17/2020 at 5:55 PM and attest that it is an accurate representation of my evaluation.   Rhona Leavens. Oscar La, MD 08/17/2020 at 5:55 PM

## 2020-08-17 ENCOUNTER — Ambulatory Visit: Payer: BLUE CROSS/BLUE SHIELD | Attending: Neurology

## 2020-08-17 ENCOUNTER — Ambulatory Visit: Payer: BLUE CROSS/BLUE SHIELD | Attending: Hematology & Oncology

## 2020-08-17 DIAGNOSIS — C50611 Malignant neoplasm of axillary tail of right female breast: Secondary | ICD-10-CM

## 2020-08-17 DIAGNOSIS — Z79811 Long term (current) use of aromatase inhibitors: Secondary | ICD-10-CM

## 2020-08-17 DIAGNOSIS — Z170 Estrogen receptor positive status [ER+]: Secondary | ICD-10-CM

## 2020-08-17 MED ORDER — LETROZOLE 2.5 MG PO TABS
2.5 mg | ORAL_TABLET | Freq: Every day | ORAL | 3 refills | Status: AC
Start: 2020-08-17 — End: ?

## 2020-08-17 NOTE — Progress Notes
[facility] Progress Note  PATIENT: [name]  MRN:  [id number]  DOB:  September 22, 1935  DATE OF SERVICE:  08/17/2020  ATTENDING PHYSICIAN: No att. providers found  PRIMARY CARE PHYSICIAN: Kaitlyn Ram., MD    ID/CC: [name] is a 85 y.o. 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    Interval Events:    Last clinic visit was on 02/16/20.  Continues receiving IVIG 60 grams every 3 weeks and Imuran 75 mg BID.   She feels her lower extremities are mainly affected. She is using a walker now.   She also reports lots of cramping in her hands. She is taking Mestinon 60 mg about once per day and is unsure whether her cramps correlate with when she takes Mestinon.   She is also experiencing a tingling, light electrical shock in her hands that she associates with the cramping. She notices it especially when opening bottles.  She was seen earlier today by hematology/oncology for bilateral breast invasive ductal carcinoma (DCIS), for which she may require surgery. She is being started on Letrozole and is scheduled for PET scan tomorrow.    Current Regimen:  Imuran 75 mg BID  IVIG 60 g q3weeks     Previously Failed  Prednisone - oral ulcers    Past Medical History:  Fibromyalgia    Breast cancer (2017) - contained, no tx   Interstitial cystitis x 30 years   Fall with brief LOC (09/2016)  Fall with knee infection/contusion (10/2018)  Depression and anxiety   Diverticulitis   COPD  Asthma     Past Surgical History:  Meniscus tear repair in left knee    Family History:   Adopted at birth. Birth mother may have had breast cancer.   Brother(58) and sister (49) were both adopted from different families    Social History:   She moved from her independent living place in [place], [place] to [place] with her daugther.   Pt  reports that she quit smoking about 37 years ago. She has quit using smokeless tobacco. She reports previous alcohol use. She reports that she does not use drugs. Smoked 0.5 PPD from 1960 -1985. Denies EtOH or recreational drug use. Retired, college-educated, widowed.     Allergies:  Allergies   Allergen Reactions   ? Beta Adrenergic Blockers Other (See Comments)     Avoid d/t Myasthenia Gravis  Avoid d/t Myasthenia Gravis     ? Levofloxacin Other (See Comments)     Flare of Myasthenia Gravis   ? Macrolides And Ketolides Other (See Comments)     Avoid d/t Myasthenia Gravis  Use with caution d/t Myasthenia Gravis  Use with caution d/t Myasthenia Gravis     ? Sulfa Antibiotics Other (See Comments)     May possibly have caused deafness in one ear  May possibly have caused deafness in one ear  other     ? Botulinum Toxins Other (See Comments)     Muscle weakness r/t Myasthenia Gravis  Muscle weakness r/t Myasthenia Gravis     ? Statins Other (See Comments)     Muscle aches  Muscle aches  Muscle aches     ? Plastic Tape Itching and Other (See Comments)     Turns the skin red where applied      Medications:   Outpatient Medications Prior to Visit   Medication Sig   ?  Albuterol Sulfate AEPB Takes 1 to 2 puffs every 4 hours as needed for wheezing or SOB Inhale Takes 1 to 2 puffs every 4 hours as needed for wheezing or SOB ..   ? ascorbic acid 500 mg tablet Take 1 tablet (500 mg total) by mouth two (2) times daily with meals.   ? azaTHIOprine 50 mg tablet Take one and one-half tablet (75 mg total) by mouth two (2) times daily.   ? buPROPion, SR, (WELLBUTRIN SR) 200 mg 12 hr tablet Take 1 tablet (200 mg total) by mouth two (2) times daily.   ? clonazePAM 1 mg tablet 0.5 mg in am and 1 mg in pm.   ? DEXILANT 60 MG DR capsule    ? DULoxetine 20 mg DR capsule Take 2 capsules (40 mg total) by mouth daily.   ? fluocinonide 0.05% external solution    ? fluticasone 50 mcg/act nasal spray Spray 1 spray by nasal route daily.   ? fluticasone-vilanterol (BREO ELLIPTA) 100-25 mcg/inh inhaler Inhale 1 puff daily.   ? GAMMAGARD 30 GM/300ML SOLN every twenty one (21) days .   ? letrozole 2.5 mg tablet Take 1 tablet (2.5 mg total) by mouth daily.   ? losartan 25 mg tablet Take 1 tablet (25 mg total) by mouth daily.   ? methenamine hippurate 1 g tablet Take 1 tablet (1 g total) by mouth two (2) times daily.   ? montelukast 10 mg tablet Take 10 mg by mouth as needed for.   ? pyridostigmine 60 mg tablet Take 1 tablet (60 mg total) by mouth three (3) times daily as needed.   ? trimethoprim 100 mg tablet Take 1 tablet (100 mg total) by mouth daily.     Facility-Administered Medications Prior to Visit   Medication   ? lidocaine 2% inj 5 mL   ? lidocaine 2% inj 5 mL   ? lidocaine-EPINEPHrine 1 %-1:100000 inj 4.8 mL   ? lidocaine-EPINEPHrine 1 %-1:100000 inj 5 mL     Physical Exam  BP 129/72  ~ Pulse 84  ~ Temp 36.6 ?C (97.9 ?F) (Forehead)  ~ Ht 5' 5'' (1.651 m)  ~ Wt 141 lb (64 kg)  ~ BMI 23.46 kg/m?    Gen: NAD, well-appearing, cooperative, appears younger than stated age.  Resp:  Able to count to 18 on a single breath.     Neurological Exam  Mental Status  Awake, alert and oriented to person, place and time. Recent and remote memory are intact. Mild dysarthria present. Language is fluent with no aphasia. Attention and concentration are normal. Fund of knowledge is appropriate for level of education.  Cranial Nerves  CN II: Visual fields full to confrontation. Blurry vision but no double vision.  CN III, IV, VI: Extraocular movements intact bilaterally.  CN V: Facial sensation is normal.  CN VII: Full and symmetric facial movement.  CN VIII: Hearing is normal.  CN IX, X: Palate elevates symmetrically. Normal gag reflex.  CN XI: Shoulder shrug strength is normal.  CN XII: Tongue midline without atrophy or fasciculations.  No left ptosis .  Motor  Normal muscle bulk throughout. No fasciculations present. Normal muscle tone.                                             Right Left  Neck flexion  4-                          4-  Neck extension                      4-                          4-   Shoulder abduction               4-                          4-  Elbow flexion                         4-                          4-  Elbow extension                    4-                          4-  Wrist flexion                           4-                          4-  Wrist extension                      4-                          4-  Hip flexion                              4-                          4-  Knee extension                      4-                          4-  Plantarflexion                         4                          4  Dorsiflexion                            4                          4  Sensory  Light touch is normal in upper and lower extremities.   Reflexes  Right                      Left  Brachioradialis                    3+                         3+  Biceps                                 2+                         2+  Triceps                                2+                         2+  Patellar                                3+                         3+  Achilles                                2+                         2+  Right pathological reflexes: Hoffmann's absent. Ankle clonus absent.  Left pathological reflexes: Hoffmann's absent. Ankle clonus absent.  Coordination  Mild bilateral intention tremors.  Gait  Casual gait: Unable to rise from chair without using arms.  Walks slowly with stooped posture and spatic left leg. .  Labs ?  Labs 08/26/2015: AChR binding (0.63) blocking (28) antibody, ESR 3, CRP 4.9    09/22/2019 11:42 AM  Component Value Ref Range & Units Status   Vitamin B12 217Low   254-1,060 pg/mL Final     Imaging and Diagnostics     DXA 06/17/2019    Impression: The patient has low bone mass, based on the Left Total Hip T-score. The patient has an estimated ten-year risk of hip fracture of 3% and an estimated ten-year risk of major fracture of 13%, based on the [employer] FRAX algorithm.    Repetitive Nerve stimulation in office 12/23/2018: No decrement after stimulation of CN XI and left ulnar nerve     (from neurologist Dr. Eliane Decree note)    MRI/MRA head 09/09/2015:  This MR angiogram of the intracerebral arteries shows the following:  1. ??Very minimal atherosclerotic change within the left posterior cerebral artery that is unlikely to be clinically significant.  2. ??There is a normal variant with the left posterior cerebral artery obtaining its flow from the anterior circulation.  3. ???No aneurysms are seen.  4. ???No new findings compared to the 11/25/2003 MRA. ??  ?  MRI orbit wwo contrast 09/09/2015: This is is a normal MRI of the orbits with and without contrast.    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 Uncovertebral joint osteophytes cause severe right C3-C4, severe right and moderate left C5-C6 and severe left and mild right C6-C7 neural foraminal narrowing. Facet joint degeneration is severe on the left at C2-C3, the right at C3-C4 and C4-C5, and is moderate bilateral and C7-T1.     ASSESSMENT & PLAN:  Mrs. Hintze is a 85 y.o. female with a pmhx of seropositive initially ocular (2017) now generalized myasthenia gravis (AchR binding and blocking +), breast cancer, interstitial cystitis, fibromyalgia, COPD, asthma. He last MG exacerbation was 2020 and received PLEX. She reports being in stable poor functionality and has fatigue which she acknowledges has multiple potential reasons for her increased fatigue and decreased endurance capacity including MG, deconditioning, fibromyalgia.  With her current IVIG infusion frequency her myasthenia is well-controlled, she has some painless fatigue with chewing which may be MG related and she is advised to take mestinon as needed, her symptoms of fatigue and pain are less likely related to myasthenia gravis and could be due to poor sleep, normal aging, and fibromyalgia.    Plan/Recommendations  - No contraindication to possible surgery for DCIS. If she is to require surgery, we would likely ensure she has an IVIG infusion within 1 week prior to the procedure.  - EMG/NCS on 09/19/20 at 2:30 PM   - To evaluate numbness and tingling in hands  - Discontinue Mestinon (TID PRN; taking once daily recently)   - Monitor for improvement in hand cramps  - Continue Azathioprine 75 mg BID (refilling today)   - Continue IVIG 60g (1g/kg) every 3 weeks ([place])    Note: greater than 40 minutes spent on this encounter in consultation and in coordination of care.    Jake Shark, M.D. Ph.D.

## 2020-08-18 ENCOUNTER — Ambulatory Visit: Payer: BLUE CROSS/BLUE SHIELD

## 2020-08-18 DIAGNOSIS — G7001 Myasthenia gravis with (acute) exacerbation: Secondary | ICD-10-CM

## 2020-08-18 MED ORDER — AZATHIOPRINE 50 MG PO TABS
75 mg | ORAL_TABLET | Freq: Two times a day (BID) | ORAL | 3 refills
Start: 2020-08-18 — End: ?

## 2020-08-18 MED ADMIN — BARIUM SULFATE 2.1% PO SUSP PLAIN: 450 mL | ORAL | @ 21:00:00 | Stop: 2020-08-18

## 2020-08-18 MED ADMIN — PET ISOTOPE 18-F FDG: 9.3 | INTRAVENOUS | @ 21:00:00 | Stop: 2020-08-18

## 2020-08-21 ENCOUNTER — Ambulatory Visit: Payer: BLUE CROSS/BLUE SHIELD

## 2020-08-22 ENCOUNTER — Telehealth: Payer: BLUE CROSS/BLUE SHIELD | Attending: Geriatric Medicine

## 2020-08-22 ENCOUNTER — Ambulatory Visit: Payer: BLUE CROSS/BLUE SHIELD

## 2020-08-22 DIAGNOSIS — C50812 Malignant neoplasm of overlapping sites of left female breast: Secondary | ICD-10-CM

## 2020-08-22 DIAGNOSIS — C50811 Malignant neoplasm of overlapping sites of right female breast: Secondary | ICD-10-CM

## 2020-08-22 DIAGNOSIS — Z170 Estrogen receptor positive status [ER+]: Secondary | ICD-10-CM

## 2020-08-22 NOTE — Telephone Encounter
Outpatient Geriatric Telephone Visit     Patient: [name]  MRN: [id number]  PCP: York Ram., MD  Date: 08/22/2020      Patient's concerns:        Assessment and Plan:    Problems:      Plan:    ?  #HTN--Increased nifedipine CR 60 mg daily. Continue losartan 25 mg.    May need uptitration of losartan.  Did not take meds today.  #Aortic Calcification--noted on chest imaging     September 22, 2019?       --Not Worsening.  Chronic.  Will monitor and treat underlying risk factors with medical and lifestyle interventions as warranted by balance between benefits and burdens.  #allergic rhinitis--trial flonase  #Macrocytosis--  #Deconditioning--initiate home PT  #GERD--s/p Nisan fundoplication  #COPD--mixed--per PFT's from [name].--consider reinitiation of inhalers.  #s/p rectocele repair  #s/p cholecystectomy  #Immunosupressed status--medication related.  Chronic. CTM  #Nocturnal hypoxemia--presumably related to MG crisis but has history of COPD. ?Will order nocturnal home O2 study and consider PFT's/Pulmonary evaluation once settled down. ?May need to restart BREO inhaler.  #Myasthenia Gravis--s/p plasmapheresis x5 October 2020, no Thymoma, on Azathioprine 150 daily and mestinon 60 tid.??Saw?Dr. Enedina Finner at [facility]. ?Was considering Soliris.??Ordering IVIG and MRI.  Encourage ongoing neurology follow up.   #IBS--titrate miralax to comfort. ?Consider duloxetine substitute for buproprion.  #Interstitial cystitis--on daily Trimethoprim 100 for UTI prophylaxis. ?Using bladder instillations prn. OFF topical estrogen due to advice from neurologist re: MG.??To see Dr. Lorenz Coaster.  Suspect recent flare related to breast CA re-diagnosis.  Doubt pseudomonas bactiuria is pathogen at present.  WIll complete current meropenum.  #Major Depression--active but appears better s/p  increase Duloxetine to 40 mg daily. Referred patient to Dr. Randie Heinz previously.  #Allergic rhinitis--previously on nasal steroids.  #Breast CA--Now with bx+.  To see [place] surgery Friday. ?Patient declined surgery in 2019 and experienced dry mouth from Tamoxifen. ?Was considering Anastrozole.Per records from [facility]:   ''07/20/2017 Screening detected right breast calcifications and distortion. The distortion was in the upper outer quadrant: Biopsy DCIS intermediate grade with CSL; calcifications UIQ: 2.8 cm: Biopsy DCIS+ ALH +CSL; 4 cm apart, axilla negative, Tis NX stage 0''  Await MRI to clarify surgical options.  #DM with CKD--stabe 3b--based on labs from [name]. ?FOllow here. ?If BP permits, may consider ARB BUT patient with h/o angioedema so will ONLY initiate this agent with great caution.??Refer to Ophthalmology.  #OA knees--previously getting hyaluronic acid injections.  #Anxiety  #Benzodiazepine dependence--back to 1.5 mg daily due to recent stress  #Fibromyalgia--duloxetine as above.   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  #Statin intolerance      # advice given about precautions for coronavirus    Future Appointments   Date Time Provider Department Center   08/22/2020 10:20 AM York Ram., MD GERI IMS 420 MEDICINE   09/12/2020  3:00 PM Jasmine Awe., MD UROENDO [phone] UROLOGY   09/13/2020  9:40 AM Fredda Hammed., MD HEM/ONC 230 MEDICINE   09/19/2020  2:30 PM SHIEH EMG ONLY (60 MIN) MP3 EMG NEUROLOGY   09/22/2020 11:00 AM York Ram., MD GERI IMS 420 MEDICINE         Total time spent on this telephone encounter:  []   5-10 minutes (99441)  []   11-20 minutes (84132)  [x]   >21 minutes (44010)      [All the following must be checked off in order to bill for this service.]    [  x]  This service is not related to a problem addressed by an E/M encounter in the prior 7 days  [x]   This service will not result in an office or telemedicine visit within the next 24 hours or soonest available appointment.   [x]   This service was initiated by the patient.   [x]   This patient is an established patient in my practice.     The patient has been notified that this visit will result in an E/M charge.    Signed: York Ram, MD

## 2020-08-23 MED ORDER — CLONAZEPAM 0.5 MG PO TABS
ORAL_TABLET | 0 refills
Start: 2020-08-23 — End: ?

## 2020-08-23 NOTE — Telephone Encounter
Patient reported that Dr. Mindi Slicker discussed the PET scan results with her and the family on a conference call today.

## 2020-08-24 ENCOUNTER — Ambulatory Visit: Payer: BLUE CROSS/BLUE SHIELD

## 2020-08-24 MED ORDER — CLONAZEPAM 0.5 MG PO TABS
ORAL_TABLET | 0 refills | Status: AC
Start: 2020-08-24 — End: ?

## 2020-08-25 MED ORDER — AZATHIOPRINE 50 MG PO TABS
75 mg | ORAL_TABLET | Freq: Two times a day (BID) | ORAL | 3 refills | Status: AC
Start: 2020-08-25 — End: ?

## 2020-08-26 ENCOUNTER — Non-Acute Institutional Stay: Payer: BLUE CROSS/BLUE SHIELD | Attending: Hematology & Oncology

## 2020-08-26 DIAGNOSIS — Z79811 Long term (current) use of aromatase inhibitors: Secondary | ICD-10-CM

## 2020-08-26 DIAGNOSIS — C50811 Malignant neoplasm of overlapping sites of right female breast: Secondary | ICD-10-CM

## 2020-08-26 DIAGNOSIS — C50812 Malignant neoplasm of overlapping sites of left female breast: Secondary | ICD-10-CM

## 2020-08-26 DIAGNOSIS — Z170 Estrogen receptor positive status [ER+]: Secondary | ICD-10-CM

## 2020-08-26 DIAGNOSIS — C50611 Malignant neoplasm of axillary tail of right female breast: Secondary | ICD-10-CM

## 2020-09-06 NOTE — Progress Notes
HEMATOLOGY/ONCOLOGY OUTPATIENT NOTE    PATIENT: [name]  MRN: [id number]  DOB: 1935/04/23  DATE OF SERVICE: 09/13/2020  Referring MD: York Ram., MD    Reason for Consultation/ Chief Complaint:  Bilateral breast cancer    HPI   [name] is a 85 y.o. female with PMH including myasthenia gravis, asymmetric hearing loss, fibromyalgia, DM with CKD, major depression, interstitial cystitis, IBS, COPD who presents to establish care for breast cancer.     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 07/19/2017 Screening detected right breast calcifications and distortion in the upper outer quadrant: Biopsy DCIS+ ALH +CSL; 4 cm apart, axilla negative.    Neoadjuvant tamoxifen started 10/30/17, stopped after 4-6 weeks due to dry mouth and mouth sores. Switched to anastrozole 01/29/18, discontinued because it made her throat dry. Declined additional antiestrogen therapy.   Moved on October of 2020 to [place]   ?  [name] then noted left breast pain in April 2022.     Underwent a diagnostic bilateral breast imaging 06/13/2020 which revealed:   ?- Right breast: solid mass with indistinct margins measuring 14 x 4 x 10 mm in the right breast at 1 o'clock located 6 centimeters from the nipple with associated microclip,  not parallel solid mass with irregular margins measuring 8 x 4 x 5 mm seen in the right breast at 12 o'clock, upper outer quadrant located 6 centimeters from the nipple.   ?- Left breast: irregular mass with angular margins measuring 10 x 9 x 9 mm seen in the left breast at 11 o'clock located 5 centimeters from the nipple, axillary lymph node in the left axilla with cortical thickness measuring up to 6 mm.    R breast biopsy at 12:00, 6 cm from nipple on 07/13/2020 revealed invasive ductal carcinoma with lobular and focal cribriform features, grade 2 ER (+) PR (+) HER2 (-), Ki-67 5%    L breast biopsy at 11:00, 5 cm from nipple on 07/13/2020 revealed invasive ductal carcinoma with lobular features, grade 2 ER (+) PR (-) HER2 (-), Ki-67 10%  ?  Breast MRI performed on 08/05/2020 that redemonstrated multifocal right breast malignancy and left breast IDC with an additional 4.3cm of NME that spanned nearly the entire left breast that appears suspicious.     [facility] genetic testing (-) for pathogenic variants.     08/17/2020 Patient presents to establish care for breast cancer. She has seen Dr. Janee Morn to discuss surgical treatment, and is quite worried about anesthesia with her myasthenia gravis . She is interested in neoadjuvant therapy     09/13/2020 Patient presents for follow-up of for breast cancer. PETCT on 08/22/2020 with no evidence of FDG avid metastatic disease. DXA scan on 08/26/2020 demonstrated osteopenia of the left femoral neck and total hip.        ECOG performance status: 0  PAST HISTORY   PMH:  Patient Active Problem List    Diagnosis Date Noted   ? Immunosuppressed status (HCC/RAF) 03/21/2020   ? HTN (hypertension) 10/31/2019   ? Aortic calcification (HCC/RAF) 09/25/2019   ? Refractive error 04/28/2019   ? Ptosis of eyelid 04/28/2019   ? Pseudophakia of both eyes 04/28/2019   ? Benzodiazepine dependence (HCC/RAF) 12/20/2018   ? Breast cancer (HCC/RAF) 12/20/2018   ? Spondylosis without myelopathy or radiculopathy, lumbar region 11/28/2018   ? Anxiety 11/27/2018   ? CKD (chronic kidney disease), stage III (HCC/RAF) 11/27/2018   ?  Nocturnal hypoxemia 04/07/2018     Last Assessment & Plan:   We discussed potential impact of hypoxemia from her COPD on other body function  Plan-overnight oximetry     ? Major depression, recurrent, chronic (HCC/RAF) 12/16/2017   ? Diabetes mellitus (HCC/RAF) 12/16/2017   ? Osteoarthritis of knee 05/17/2017   ? H/O arthroscopy 03/06/2017   ? Myasthenia gravis with exacerbation (HCC/RAF) 12/18/2016   ? Vaginal atrophy 08/16/2016   ? Acute medial meniscal tear, left, subsequent encounter 07/18/2016   ? Renal cyst 03/12/2016   ? Asthmatic bronchitis, mild persistent, with acute exacerbation 12/29/2015     Last Assessment & Plan:   Continue rescue inhaler as needed. Try adding sample Bevespi maintenance inhaler to see if this helps.  She is currently on prednisone 20 mg daily maintenance and I don't think adding a steroid inhaler component will make any difference.     ? Urethral caruncle 07/26/2015   ? Chronic cholecystitis 01/14/2015   ? Angioedema 05/23/2014     Attributed to injected methylprednisolone    Last Assessment & Plan:   We discussed limited ability to test for possible triggers. We are asking our labs sources about ability to test against methylprednisolone for injection.  A different brand might be worth trying. She can try pre-medicating with an antihistamine.     ? Chronic interstitial cystitis 10/01/2011   ? Insomnia 05/31/2007     Qualifier: Diagnosis of   By: Maple Hudson MD, Clinton D    Last Assessment & Plan:   I'm concerned that some of her sleep problems may reflect obstructive sleep apnea based on her palate and which she can tell me about her sleep patterns without a witness. We also want to know about oxygenation during sleep.  Plan-schedule sleep study  Qualifier: Diagnosis of   By: Maple Hudson MD, Clinton D      Last Assessment & Plan:   I'm concerned that some of her sleep problems may reflect obstructive sleep apnea based on her palate and which she can tell me about her sleep patterns without a witness. We also want to know about oxygenation during sleep.  Plan-schedule sleep study     ? COPD mixed type (HCC/RAF) 04/30/2007     Office Spirometry 03/16/2016-severe obstructive airways disease FVC 1.59/59%, FEV1 0.98/49%, ratio 0.62, FEF 25-75% 0.42/28%  PFT 04/16/16-severe obstructive airways disease with slight response to bronchodilator, severe diffusion defect. FEV1/FVC 0.64, DLCO 45%    Last Assessment & Plan:   Severe obstructive airways disease. She can't hear herself wheeze.  Plan-try samples of Trelegy instead of Anoro for comparison    Overview:   Office Spirometry 03/16/2016-severe obstructive airways disease FVC 1.59/59%, FEV1 0.98/49%, ratio 0.62, FEF 25-75% 0.42/28%  PFT 04/16/16-severe obstructive airways disease with slight response to bronchodilator, severe diffusion defect. FEV1/FVC 0.64, DLCO 45%    Last Assessment & Plan:   Severe obstructive airways disease. She can't hear herself wheeze.  Plan-try samples of Trelegy instead of Anoro for comparison  Office Spirometry 03/16/2016-severe obstructive airways disease FVC 1.59/59%, FEV1 0.98/49%, ratio 0.62, FEF 25-75% 0.42/28%  PFT 04/16/16-severe obstructive airways disease with slight response to bronchodilator, severe diffusion defect. FEV1/FVC 0.64, DLCO 45%    Last Assessment & Plan:   Severe COPD but mainly emphysema with limited bronchospasm component that could be addressed with bronchodilators.  Plan-try sample Bevespi as a maintenance controller, refill pro-air     ? Esophageal reflux 04/30/2007     History of fundoplication  Last Assessment & Plan:   Emphasized continued attention to antireflux measures. Reminded that reflux can be a significant trigger for irritable airways, cough and wheeze.  History of fundoplication       Last Assessment & Plan:   Emphasized continued attention to antireflux measures. Reminded that reflux can be a significant trigger for irritable airways, cough and wheeze.     ? 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 Management is mostly by her ENT physicians.     ? Fibromyalgia 04/30/2007     Qualifier: Diagnosis of   By: Yetta Barre CNA/MA, Rose Fillers: Diagnosis of   By: Yetta Barre CNA/MA, Shanda Bumps     ? Seasonal and perennial allergic rhinitis 04/30/2007     Qualifier: Diagnosis of   By: Yetta Barre CNA/MA, Shanda Bumps     Last Assessment & Plan:   She is now on prednisone 20 mg daily. I suggested she wait until her myasthenia status is stabilized. Then if she needs to she couldn't seek evaluation at one of the allergy practices in town as discussed, since our vaccine program will be closing.  Qualifier: Diagnosis of   By: Yetta Barre CNA/MA, Jessica       Last Assessment & Plan:   She is now on prednisone 20 mg daily. I suggested she wait until her myasthenia status is stabilized. Then if she needs to she couldn't seek evaluation at one of the allergy practices in town as discussed, since our vaccine program will be closing.     Tobacco: smoked  For 30 years     Surgical History:  No past surgical history on file.    Gyn Hx:  Menses 15   G2P2- first birth 42  Menopause :50's   Menopausal status: Postmenopausal  Hormone therapy use: None      Family and Social History  No family history on file.  Social History     Tobacco Use   ? Smoking status: Former Smoker     Quit date: 1985     Years since quitting: 37.5   ? Smokeless tobacco: Former Estate agent Use Topics   ? Alcohol use: Not Currently       MEDS     No outpatient medications have been marked as taking for the 09/13/20 encounter (Appointment) with Fredda Hammed., MD.     Allergies   Allergen Reactions   ? Beta Adrenergic Blockers Other (See Comments)     Avoid d/t Myasthenia Gravis  Avoid d/t Myasthenia Gravis     ? Levofloxacin Other (See Comments)     Flare of Myasthenia Gravis   ? Macrolides And Ketolides Other (See Comments)     Avoid d/t Myasthenia Gravis  Use with caution d/t Myasthenia Gravis  Use with caution d/t Myasthenia Gravis     ? Sulfa Antibiotics Other (See Comments)     May possibly have caused deafness in one ear  May possibly have caused deafness in one ear  other     ? Botulinum Toxins Other (See Comments)     Muscle weakness r/t Myasthenia Gravis  Muscle weakness r/t Myasthenia Gravis     ? Statins Other (See Comments)     Muscle aches  Muscle aches  Muscle aches     ? Plastic Tape Itching and  Other (See Comments) Turns the skin red where applied       PHYSICAL EXAM   There were no vitals filed for this visit.   There is no height or weight on file to calculate BMI.  Wt Readings from Last 3 Encounters:   08/17/20 64 kg (141 lb)   08/17/20 67.5 kg (148 lb 12.8 oz)   08/10/20 67.4 kg (148 lb 11.2 oz)       Review of Systems:  Pertinent items are noted in HPI.      Physical Exam:  General: No acute distress. Appears well-developed, well-nourished and close to stated age.   Head: Normocephalic, atraumatic.  Eyes: Sclera anicteric. EOMI.  ENT: Hearing grossly normal bilaterally. Oropharynx is clear, mucus membranes are moist.  No oral ulcers noted. Good dentition.  Neck: Supple. Trachea midline.   Cardiac: Regular rate and rhythm. Normal S1, S2. No murmurs, rubs, or gallops.  Respiratory: Clear to auscultation bilaterally. No wheezes, rales, or rhonchi noted. Respiratory effort appears normal.   Abdomen: Soft, nontender and nondistended. Bowel sounds are present and normoactive. No organomegaly is appreciated.  Musculoskeletal: No edema. No cyanosis. Extremities are warm and well-perfused.   Extremities: Warm. No edema. No cyanosis.  Neurologic: Gait appears normal. Sensation intact to light touch in all four extremities. Oriented to person, place and time.   Hematologic: No bruising, purpura or petechiae are noted.   Dermatologic: Skin intact.  No rashes appreciated.   Lymphatic: No palpable cervical, supraclavicular, axillary or inguinal adenopathy appreciated.   Psychiatric: Affect appropriate.  Pleasant and conversant.   Breast exam: no breast masses or axillary adenopathy noted     Recent Labs:  Lab Results   Component Value Date    WBC 6.10 03/23/2020    HGB 12.7 03/23/2020    HCT 38.0 03/23/2020    MCV 97.9 03/23/2020    PLT 418 (H) 03/23/2020      Lab Results   Component Value Date    NA 136 06/09/2020    K 4.0 06/09/2020    CL 102 06/09/2020    CO2 22 06/09/2020    CREAT 1.63 (H) 06/09/2020    BUN 23 (H) 06/09/2020 Lab Results   Component Value Date    ALT 20 03/23/2020    AST 28 03/23/2020    ALKPHOS 80 03/23/2020    BILITOT 0.3 03/23/2020     Lab Results   Component Value Date    CALCIUM 9.4 06/09/2020       Pertinent imaging:  08/26/2020 DXA lumbar spine/hip  -----------------------------------------------------------------   Region ? ? ? ? ? ? ? ? ? BMD ? ?T-score ?Z-score ? Classification   -----------------------------------------------------------------   AP Spine(L1-L4) ? ? ? ? ?1.062 ? ?0.1 ? ? ?2.9 ? ? ? Normal   Femoral Neck (Left) ? ? ?0.723 ? -1.1 ? ? ?1.3 ? ? ? Osteopenia   Total Hip (Left) ? ? ? ? 0.725 ? -1.8 ? ? ?0.4 ? ? ? Osteopenia   -----------------------------------------------------------------   Impression: The BMD for  the AP Spine(L1-L4) decreased, changing by -2.4% since the last DXA exam.     08/22/2020 PETCT  1.  Mild to moderate left greater than right FDG activity associated with small soft tissue densities in the bilateral breasts in the regions marked by breast biopsy clips.   2.  No evidence of FDG avid metastatic disease.    08/05/2020 MRI breast  1. Right breast:   a) Linear nonmass enhancement measuring  13 mm at 11:00, 5 cm deep to the nipple corresponds to the suspicious architectural distortion seen mammographically. This likely represents the same process as the known malignancy marked by the X microclip from   outside biopsy, which lies 13 mm anterior to this enhancement. Surgical and oncologic management is recommended. BI-RADS CATEGORY 6.  b) Irregular mass containing a microclip measuring 10 mm at 12:00, 6 cm deep to the nipple is a known malignancy. Surgical and oncologic management is recommended. BI-RADS CATEGORY 6.  c) Focal nonmass enhancement containing a microclip from a biopsy performed outside facility measuring 16 mm at 9:00, 2 cm deep to the nipple is a known malignancy. Surgical and oncologic management is recommended. BI-RADS CATEGORY 6.  ?  2. Left breast:   a) Irregular mass containing a microclip measuring 15 mm at 11:00, 5 cm deep to the nipple is a known malignancy. Surgical and oncologic management is recommended. BI-RADS CATEGORY 6.  b) Nonmass enhancement measuring 43 mm throughout the majority of the left breast is suspicious. If breast conservation is a consideration recommend MRI guided biopsy of the anterior-inferior aspect of the nonmass enhancement. BI-RADS CATEGORY 4  c) Oval enhancing mass measuring 5 mm with suggestion of a fatty hilum lies at 4:00, 7 cm deep to the nipple is probably benign and likely represents an intramammary lymph node. Recommend second look ultrasound if there is no sonographic correlate then   short-term MRI follow-up in 6 months is recommended. BI-RADS CATEGORY 3.  d) No MRI correlate for prominent axillary lymph node seen on prior ultrasound. Management as detailed on prior ultrasound report is recommended. BI-RADS CATEGORY 2.  ?  OVERALL ASSESSMENT: BI-RADS CATEGORY 4     06/13/2020 Mammogram/US bilat breasts, diagnostic  IMPRESSION:  Right breast:  1. Mammographic architectural distortion/sonographic mass at 12:00 6 cm from nipple are suspicious.  Recommend ultrasound-guided biopsy.  2. Two biopsied areas in the right breast are known DCIS and ADH. Appropriate action should be taken.  3. Calcifications in the upper outer quadrant of the right breast at  posterior depth located 5 centimeters from the nipple are suspicious. Await results from biopsy at  site of distortion/12:00.  4. Additional solid masses in the right breast at 12:00 and 2 o'clock are probably benign.  Sonographic follow-up in 6 months is recommended tentatively.    Left breast:  1. Irregular mass in the left breast at 11 o'clock is highly suggestive  of malignancy. An ultrasound guided biopsy is recommended.  2. Vague hypoechoic area in the left breast at 12 o'clock, upper outer  quadrant is probably benign. Sonographic follow-up in 6 months is recommended.  3. Solid mass in the left breast at 1 o'clock is probably benign.  Sonographic follow-up in 6 months is recommended.  4. There is a lymph node in the left axilla with cortical  thickness measuring up to 6 mm. Surgical consultation is recommended.  BI-RADS Category 5:  Highly Suggestive for Malignancy    09/22/2019 XR chest  No acute findings or abnormalities related to provided history.    06/19/2019 US pelvis  Small subserosal uterine fundal fibroid. Otherwise normal postmenopausal pelvic ultrasound.        Pertinent pathology:  08/02/2020 [facility] genetics      07/13/2020 Tissue exam  A. BREAST, RIGHT, MASS, 12:00, 6 CM FROM NIPPLE (NEEDLE CORE BIOPSY):  - Invasive ductal carcinoma with lobular and focal cribriform features, grade 2 (50% of biopsy; longest segment 4 mm)  Modified Bloom and Richardson score: 6 of 9                        Tubule formation:                3                        Nuclear pleomorphism:       2                        Mitotic score:                       1  - Ductal carcinoma in situ (DCIS), low nuclear grade, cribriform type with focal central necrosis  - Breast biomarkers: See below  - ERBB2 (HER2) FISH: Negative for HER2 gene amplification   ?  B.  BREAST, LEFT, MASS, 11:00, 5 CM FROM NIPPLE (NEEDLE CORE BIOPSY):  - Invasive ductal carcinoma with lobular features, grade 2 (50% of biopsy; longest involved segment 6 mm)             Modified Bloom and Richardson score: 6 of 9                        Tubule formation:                3                        Nuclear pleomorphism:       2                        Mitotic score:                       1  - Ductal carcinoma in situ (DCIS) is not identified  - Breast biomarkers: See immunohistochemistry report below  - ERBB2 (HER2) FISH: Negative [based on IHC (2+) and FISH, see comment].     COMMENT: It is uncertain whether patients with an average of >4.0 and <6.0 HER2?signals per cell and a HER2/CEP17 ratio of <2.0 benefit from HER2-targeted therapy in the absence of protein overexpression (IHC 3+). If the specimen test result is close to the [facility] ratio threshold for positive, there is a higher likelihood that repeat testing will result in different results by chance alone. Therefore, when [facility] results are not 3+ positive, it is recommended that the sample be considered negative without additional testing on the specimen.    BREAST BIOMARKER REPORT  ?  BLOCK:  A1  FIXATIVE:  Formalin   ?  RESULT ER/PR:  ? ESTROGEN   RECEPTOR PROGESTERONE RECEPTOR   Antibody Clone SP1 Clone 636   %Tumor Staining >95% >95%   Intensity (1+ to 3+) 3+ 3+   ?  RESULT HER-2/neu IHC assay (utilizing FDA-approved DAKO Hercep Test): Arch Pathol Lab Med [phone], ASCO/CAP HER2 Testing in Breast Cancer Update - Veronia Beets al  ?  Test Score: 1+  ?  HER2/neu:  Negative  ?  RESULT Ki-67 (Clone MIB1):  5%  ?  BREAST BIOMARKER REPORT  ?  BLOCK:  B1  FIXATIVE:  Formalin   ?  RESULT ER/PR:  ? ESTROGEN   RECEPTOR PROGESTERONE RECEPTOR   Antibody Clone SP1 Clone 636   %  Tumor Staining >95% 0%   Intensity (1+ to 3+) 3+ NA   ?  RESULT HER-2/neu IHC assay (utilizing FDA-approved DAKO Hercep Test): Arch Pathol Lab Med [phone], ASCO/CAP HER2 Testing in Breast Cancer Update - Veronia Beets al  ?  Test Score: 2+  ?  HER2/neu:  Equivocal  ?  RESULT Ki-67 (Clone MIB1):  10%    07/19/2017 Outside pathology        ASSESSMENT & PLAN   [name] is a 85 y.o. female presents for     1. 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 She elected not to have surgery at that time. She took tamoxifen-->anastrozole for a short period of time, but discontinued endocrine therapy due to side effects.   - R breast biopsy at 12:00, 6 cm from nipple on 07/13/2020 revealed invasive ductal carcinoma with lobular and focal cribriform features, grade 2 ER (+) PR (+) HER2 (-), Ki-67 5%  - L breast biopsy at 11:00, 5 cm from nipple on 07/13/2020 revealed invasive ductal carcinoma with lobular features, grade 2 ER (+) PR (-) HER2 (-), Ki-67 10%  - We had a long discussion about biology and treatment of breast cancer, with emphasis that cure can only be attained with multimodality treatments such as surgery/ +/- radiation and systemic ( endocrine) therapy .  We have discussed that given her concern for anesthesia with Myasthenia Gravis, we cat try neoadjuvant endocrine therapy with close observation of side effects.  - We will start letrozole 2.5 mg po daily and follow up closely. If she is unable to tolerate, we will switch to exemestane, and if that is not an option we can try fulvestrant ( estrogen receptor down regulator)   - tumors are not palpable on exam, and she would have to be followed with imaging     09/13/2020  -  PETCT on 08/22/2020 with no evidence of FDG avid metastatic disease.   - Continue Letrozole 2.5 mg daily  - she will let us know of her progress        2.  Bone density  - DXA scan on 08/26/2020 demonstrated osteopenia of the left femoral neck and total hip.      3.  Myasthenia gravis   - F/u w Dr. Claudie Leach, Neurology, on 08/17/2020. No contraindication to possible surgery for DCIS. If she is to require surgery, we would likely ensure she has an IVIG infusion within 1 week prior to the procedure. EMG/NCS on 09/19/20 at 2:30 PM. To evaluate numbness and tingling in hands. Discontinue Mestinon (TID PRN; taking once daily recently). Monitor for improvement in hand cramps. Continue Azathioprine 75 mg BID (refilling today). Continue IVIG 60g (1g/kg) every 3 weeks ([place])         DIAGNOSES/ORDERS addressed on 09/13/2020:  1. Breast cancer (HCC/RAF)      - on Letrozole - continue close monitoring for toxicity of intensive drug therapy.    NEW PROBLEMS AS OF 09/13/2020:     []  New minor or self-limited problem (99202/99212).  []  2+ minor or self-limited problem, stable chronic illness, acute uncomplicated illness/injury (99203/99213).  []  Chronic illness with exacerbation/progression/tx side effect; 2+ stable chronic illnesses, new problem with uncertain prognosis, acute illness with systemic symptoms, or acute complicated injury (99204/99214).  []  Chronic illness with severe exacerbation/progression/tx side effect, acute/chronic illness or injury  that poses threat to life or bodily function (99205/99215).      REVIEW OF DATA   I have  [x]  Reviewed/ordered []  1 []  2 [x]  ? 3 unique laboratory, radiology, and/or diagnostic tests noted below  No orders of the defined types were placed in this encounter.    08/26/2020 DXA lumbar spine/hip  08/22/2020 PETCT  08/05/2020 MRI breast    [x]  Reviewed []  1 []  2 [x]  ? 3 prior external notes and incorporated into patient assessment   I reviewed Dr. Velvet Bathe, MD in Surgery, Surgical Oncology on 08/10/2020.  I reviewed Dr. York Ram, MD in Medicine, Internal Medicine on 07/13/2020.  I reviewed Dr. Johnney Killian. Desma Maxim, MD in Obstetrics & Gynecology on 05/07/2019.    []  Discussed management or test interpretation with external provider(s) on 09/13/2020 as noted:      RISK OF COMPLICATION   This writer has deemed the above diagnoses to have a risk of complication, morbidity or mortality of:   []  Minimal  []  Low  []  Moderate   []  due to prescription drug management   [] Decision re: minor surgery   [] Diagnosis or treatment limited by social determinants of health  []  Severe    []  due to intensive monitoring for chemotherapy toxicity   []  Decision elective surgery with risk factors  []  Decision regarding need for hospitalization  []  Advanced Care Planning decisions    SOCIAL DETERMINANTS OF HEALTH   []  The diagnosis or treatment of said conditions is significantly limited by social determinants of health:      I spent 30 minutes counseling and coordinating care for the items checked below on the day of service 09/13/2020  [x]  Preparing to see the patient (e.g., review of tests)  [x]  Obtaining and or reviewing separately obtained history   [x]  Performing a medically appropriate examination and/or evaluation   [x]  Counseling and educating the patient/family/caregiver   [x]  Ordering medications, tests, or procedures  []  Referring and communicating with other healthcare professionals (when not separately reported)  [x]  Documenting clinical information in the EHR  []  Independently interpreting results and communicating results to patient/ family/caregiver     No follow-ups on file.       The above recommendation were discussed with the patient. The patient has all questions answered satisfactorily and is in agreement with this recommended plan of care.    Scribe Signature:  I, Salley Scarlet, have scribed for [employer]. Oscar La, MD on this encounter with [name] at the [facility] in [place] on 09/13/2020 at 4:52 PM 09/13/2020.     Physician Signature:  I have reviewed this note, scribed by Salley Scarlet, I have edited and attest that it is an accurate representation of my evaluation.   Rhona Leavens. Oscar La, MD 09/13/2020 at 4:52 PM

## 2020-09-12 ENCOUNTER — Non-Acute Institutional Stay: Payer: BLUE CROSS/BLUE SHIELD

## 2020-09-13 ENCOUNTER — Ambulatory Visit: Payer: BLUE CROSS/BLUE SHIELD

## 2020-09-13 ENCOUNTER — Ambulatory Visit: Payer: BLUE CROSS/BLUE SHIELD | Attending: Hematology & Oncology

## 2020-09-13 ENCOUNTER — Non-Acute Institutional Stay: Payer: BLUE CROSS/BLUE SHIELD | Attending: Hematology & Oncology

## 2020-09-13 DIAGNOSIS — Z87440 Personal history of urinary (tract) infections: Secondary | ICD-10-CM

## 2020-09-13 MED ORDER — ESTRADIOL 0.1 MG/GM VA CREA
1 g | VAGINAL | 6 refills | Status: AC
Start: 2020-09-13 — End: ?

## 2020-09-13 NOTE — Patient Instructions
Schedule breast MRI in 3 months at [phone].

## 2020-09-13 NOTE — Addendum Note
Addended by: Frutoso Chase on: 09/13/2020 10:15 AM     Modules accepted: Orders

## 2020-09-14 NOTE — Progress Notes
I had the pleasure of seeing [name], a 85 y.o. female, in the urology office today regarding her h/o bacterial cystitis, h/o BNO, h/o OAB.    Previous intervention:  Seen, evaluated couple of years ago and offered intervention for her obstructive voiding and OAB. She declined.    Interim course: cystitis.    On hiprex bid. Urine pH is acidic consistently, ergo not on Vit C with hiprex.    Dr Aaron Edelman notes reviewed.    [name] does describe double voiding and won't empty unless she takes her time to pee, rise to standing position, and finish peeing. She notes that she is often in a hurry and doesn't take time to empty on many occasions.    Past Medical History:   Diagnosis Date   ? Breast cancer (HCC/RAF) 12/20/2018       No past surgical history on file.    Allergies   Allergen Reactions   ? Beta Adrenergic Blockers Other (See Comments)     Avoid d/t Myasthenia Gravis  Avoid d/t Myasthenia Gravis     ? Levofloxacin Other (See Comments)     Flare of Myasthenia Gravis   ? Macrolides And Ketolides Other (See Comments)     Avoid d/t Myasthenia Gravis  Use with caution d/t Myasthenia Gravis  Use with caution d/t Myasthenia Gravis     ? Sulfa Antibiotics Other (See Comments)     May possibly have caused deafness in one ear  May possibly have caused deafness in one ear  other     ? Botulinum Toxins Other (See Comments)     Muscle weakness r/t Myasthenia Gravis  Muscle weakness r/t Myasthenia Gravis     ? Statins Other (See Comments)     Muscle aches  Muscle aches  Muscle aches     ? Plastic Tape Itching and Other (See Comments)     Turns the skin red where applied       Outpatient Medications Prior to Visit   Medication Sig   ? Albuterol Sulfate AEPB Takes 1 to 2 puffs every 4 hours as needed for wheezing or SOB Inhale Takes 1 to 2 puffs every 4 hours as needed for wheezing or SOB ..   ? ascorbic acid 500 mg tablet Take 1 tablet (500 mg total) by mouth two (2) times daily with meals.   ? azaTHIOprine 50 mg tablet Take one and one-half tablet (75 mg total) by mouth two (2) times daily.   ? buPROPion, SR, (WELLBUTRIN SR) 200 mg 12 hr tablet Take 1 tablet (200 mg total) by mouth two (2) times daily.   ? CLONAZEPAM 0.5 mg tablet Take 1 tablet (0.5 Mg total) by mouth daily.   ? clonazePAM 1 mg tablet 0.5 mg in am and 1 mg in pm.   ? DEXILANT 60 MG DR capsule Take 60 mg by mouth daily.   ? DULoxetine 20 mg DR capsule Take 2 capsules (40 mg total) by mouth daily.   ? fluocinonide 0.05% external solution    ? fluticasone 50 mcg/act nasal spray Spray 1 spray by nasal route daily.   ? fluticasone-vilanterol (BREO ELLIPTA) 100-25 mcg/inh inhaler Inhale 1 puff daily. (Patient taking differently: Inhale 1 puff as needed for.)   ? GAMMAGARD 30 GM/300ML SOLN Every 2 weeks.   ? letrozole 2.5 mg tablet Take 1 tablet (2.5 mg total) by mouth daily. (Patient not taking: Reported on 08/17/2020.)   ? losartan 25 mg tablet Take 1 tablet (  25 mg total) by mouth daily.   ? methenamine hippurate 1 g tablet Take 1 tablet (1 g total) by mouth two (2) times daily.   ? montelukast 10 mg tablet Take 10 mg by mouth as needed for. (Patient not taking: Reported on 08/17/2020.)   ? pyridostigmine 60 mg tablet Take 1 tablet (60 mg total) by mouth three (3) times daily as needed.   ? trimethoprim 100 mg tablet Take 1 tablet (100 mg total) by mouth daily.     Facility-Administered Medications Prior to Visit   Medication   ? lidocaine 2% inj 5 mL   ? lidocaine 2% inj 5 mL   ? lidocaine-EPINEPHrine 1 %-1:100000 inj 4.8 mL   ? lidocaine-EPINEPHrine 1 %-1:100000 inj 5 mL       Exam:   Nad, aao  Euthymic mood, appropriate affect  Very pleasant person  abd soft nt nd, no cvat    UA: wnl  PVR: 0 cc by Ultrasound    Impression:  H/o cystitis about 3 months ago, h/o double voiding and bladder neck dysfunction, h/o OAB. On hiprex to prevent UTI.  Bladder empties well and PET CT from 4 weeks ago reveals no upper urinary tract lesion/nidus for infection    Plan: continue hiprex, add local estrogen cream [facility] reviewed), rtc urology prn    25 minutes in face-to-face time, preparation to see the patient, obtaining and/or reviewing separately obtained history, counseling and education, ordering medications, tests, procedures, documenting clinical information in the electronic medical record.    Thank you for allowing me to participate in [facility] care. If you have any questions or concerns, please feel free to contact me at any time.  Sincerely,    [place] Adyan Palau, MD  Associate Professor of Urology  Division of Pelvic Medicine and Reconstructive Surgery  [facility]

## 2020-09-19 ENCOUNTER — Ambulatory Visit: Payer: BLUE CROSS/BLUE SHIELD | Attending: Neurology

## 2020-09-19 DIAGNOSIS — G7001 Myasthenia gravis with (acute) exacerbation: Secondary | ICD-10-CM

## 2020-09-19 NOTE — Procedures
Please refer to ''notes'' tab for formatted table view.  ---------------------------------------------------------------------------------  [facility] Department of Neurology [facility]  [address]   [place] [phone]     Valinda Hoar [phone]    Test Date:  09/19/2020    Patient: [name] DOB: 1935/04/17 Physician: Jake Shark, M.D Ph.D.   Sex: Female       ID#: [id number]   Ref Phys: Jake Shark, M.D Ph.D.     Patient History / Exam:  85 year old with myasthenia gravis who is experiencing numbness and paresthesias of the upper extremities as well as gait imbalance    Sensory Summary Table   Stim Site NR Onset (ms) Peak (ms) O-P Amp (?V) Site1 Site2 Delta-0 (ms) Dist (cm) Vel (m/s)   Left Median Anti Sensory (2nd Digit)   Wrist    2.1 2.9 25.4 Wrist 2nd Digit 2.1 13.0 62   Right Median Anti Sensory (2nd Digit)   Wrist    2.4 3.2 26.7 Wrist 2nd Digit 2.4 13.0 54   Left Radial Anti Sensory (Base 1st Digit)   Wrist    1.3 1.8 37.4 Wrist Base 1st Digit 1.3 8.5 65   Right Radial Anti Sensory (Base 1st Digit)   Wrist    1.2 1.7 25.7 Wrist Base 1st Digit 1.2 7.0 58   Right Sup Peron Anti Sensory (Ant Lat Mall)   14 cm    2.4 3.3 7.3 14 cm Ant Lat Mall 2.4 10.0 42   Left Ulnar Anti Sensory (5th Digit)   Wrist    1.8 2.6 16.3 Wrist 5th Digit 1.8 10.0 56   Right Ulnar Anti Sensory (5th Digit)   Wrist    1.9 2.7 23.5 Wrist 5th Digit 1.9 11.0 58     Motor Summary Table   Stim Site NR Onset (ms) O-P Amp (mV) Site1 Site2 Delta-0 (ms) Dist (cm) Vel (m/s)   Left Median Motor (Abd Poll Brev)   Wrist    2.5 8.1 Elbow Wrist 4.2 22.0 52   Elbow    6.7 7.5        Right Median Motor (Abd Poll Brev)   Wrist    2.7 7.6 Elbow Wrist 4.5 24.0 53   Elbow    7.2 7.1        Right Peroneal Motor   Ankle    4.5 3.5 Ankle B. Fibula 7.2 32.0 44   B. Fibula    11.7 3.2 B. Fibula A. Fibula 1.3 8.0 62   A. Fibula    13.0 3.1        Left Ulnar Motor (Abd Dig Min)   Wrist    2.3 7.0 B Elbow Wrist 3.6 21.0 58   B Elbow    5.9 6.7 A Elbow B Elbow 1.8 10.0 56   A Elbow    7.7 6.8        Right Ulnar Motor (Abd Dig Min)   Wrist    2.3 7.8 B Elbow Wrist 3.6 23.0 64   B Elbow    5.9 7.6 A Elbow B Elbow 1.7 9.0 53   A Elbow    7.6 7.7          F Wave Studies   NR F-Lat (ms) L-R F-Lat (ms)   Left Median (Mrkrs)      22.81 4.53   Right Median (Mrkrs)      27.34 4.53     EMG   Side Muscle Nerve Root Ins Act  Fibs/PSW Fascics Other Amp Dur Poly Recrt   Right AntTibialis Dp Br Peron L4-5 Nml None None None Nml Nml 0 Nml     Results:  1. Left median, left ulnar, left radial, right median, right ulnar, right radial and right superficial peroneal SNAPs were normal.  2. Left median, left ulnar, right median, right ulnar and right peroneal CMAPs were normal.  3. Left median and right median F-wave response latencies were normal.  4. Monopolar needle EMG of the right tibialis anterior was normal..    Impression:  This is a normal study. Specifically, there is no electrodiagnostic evidence of a neuropathy in this study.    Jake Shark, M.D. Ph.D.  [facility] Attending    Waveforms:

## 2020-09-22 ENCOUNTER — Ambulatory Visit: Payer: BLUE CROSS/BLUE SHIELD | Attending: Geriatric Medicine

## 2020-09-22 MED ORDER — CLONAZEPAM 0.5 MG PO TABS: Start: 2020-09-22 — End: ?

## 2020-09-22 MED ORDER — ALBUTEROL SULFATE 108 (90 BASE) MCG/ACT IN AEPB
3 refills | Status: AC
Start: 2020-09-22 — End: ?

## 2020-09-22 NOTE — Goals of Care
Advance Care Planning   Goals of Care   Patient does not want life support measures under any circumstances.  If she develops a severe illness, she prefers comfort as a priority.  She has a strong desire to use hospice care if she becomes seriously ill.    DO NOT WRITE ANY GOALS OF CARE TEXT BELOW THIS LINE

## 2020-09-22 NOTE — Patient Instructions
Take Breo every morning

## 2020-09-22 NOTE — Progress Notes
OUTPATIENT GERIATRICS CLINIC NOTE    PATIENT:  [name]   MRN:  [id number]  DOB:  1935/05/20  DATE OF SERVICE:  09/22/2020  PRIMARY CARE PHYSICIAN: York Ram., MD    CHIEF COMPLAINT:   No chief complaint on file.      [facility]:  [employer]    Non[facility] [place] [facility]:    Chaperone status:  No data recorded    HISTORY OF PRESENT ILLNESS     [name] is a 85 y.o. female who presents today for *follow up**. Patient is accompanied by *no one**. History today is per the patient,and review of available recent records in [facility] and Care Everywhere.     Patient is a(n) [x]  reliable []  unreliable historian and additional collateral information was obtained during the visit today from:     Tolerating hormonal therapy (Letrozole)    Per chart review, pertinent medical history:  Past Medical History:   Diagnosis Date   ? Breast cancer (HCC/RAF) 12/20/2018     No past surgical history on file.    ALLERGIES     Allergies   Allergen Reactions   ? Beta Adrenergic Blockers Other (See Comments)     Avoid d/t Myasthenia Gravis  Avoid d/t Myasthenia Gravis     ? Levofloxacin Other (See Comments)     Flare of Myasthenia Gravis   ? Macrolides And Ketolides Other (See Comments)     Avoid d/t Myasthenia Gravis  Use with caution d/t Myasthenia Gravis  Use with caution d/t Myasthenia Gravis     ? Sulfa Antibiotics Other (See Comments)     May possibly have caused deafness in one ear  May possibly have caused deafness in one ear  other     ? Botulinum Toxins Other (See Comments)     Muscle weakness r/t Myasthenia Gravis  Muscle weakness r/t Myasthenia Gravis     ? Statins Other (See Comments)     Muscle aches  Muscle aches  Muscle aches     ? Plastic Tape Itching and Other (See Comments)     Turns the skin red where applied        MEDICATIONS     Personally reviewed.    No outpatient medications have been marked as taking for the 09/22/20 encounter (Appointment) with York Ram., MD.       SOCIAL HISTORY Social History     Social History Narrative   ? Not on file        FUNCTIONAL STATUS     BADLs:  IADLs:    Ambulates with   Falls in past year:  Afraid of falling:    GERIATRIC REVIEW OF SYSTEMS     Vision:    []   glasses     []  legally blind  Hearing: []  hearing aids    Nutrition: []  normal   []  impaired  []  vegan  []  vegetarian  []  low salt  []  low-carb   Swallowing: []  impaired   Dentures:   []  yes    Depression:  []  yes   Cognition:     Incontinence: [] urine  []  fecal  []  urine and fecal      ADVANCED CARE PLANNING     Advanced directives on file: []  No  []   Yes ? Completed:     Medical DPOA on file:   []   Yes ?   []   No ? Patient designates ** * to be their surrogate medical decision-maker.  HEALTH CARE MAINTENANCE     IMMUNIZATIONS:   Immunization History   Administered Date(s) Administered   ? COVID-19, mRNA, (Pfizer - Purple Cap) 30 mcg/0.3 mL 03/11/2019, 04/01/2019, 10/29/2019   ? COVID-19, mRNA, tris-sucrose (Pfizer - [employer]) 30 mcg/0.3 mL 06/20/2020   ? DTaP 12/31/2015   ? influenza vaccine IM quadrivalent (Fluzone Quad) MDV (52 months of age and older) 11/26/2016   ? influenza vaccine IM quadrivalent adjuvanted (FluAD Quad) (PF) SYR (23 years of age and older) 11/05/2018, 12/01/2019   ? influenza, unspecified formulation 04/01/2019, 04/01/2019, 12/01/2019, 12/01/2019, 12/01/2019, 12/01/2019   ? pneumococcal polysaccharide vaccine 23-valent (Pneumovax) 01/10/2019       Mammogram:  DXA:  PAP:  Colonoscopy:      PHYSICAL EXAM     BP 132/70  ~ Pulse 77  ~ Temp 36.3 ?C (97.4 ?F) (Temporal)  ~ Resp 19  ~ Ht 5' 5'' (1.651 m)  ~ Wt 150 lb 1.6 oz (68.1 kg)  ~ SpO2 93%  ~ BMI 24.98 kg/m?   Wt Readings from Last 3 Encounters:   09/13/20 149 lb 12.8 oz (67.9 kg)   08/17/20 141 lb (64 kg)   08/17/20 148 lb 12.8 oz (67.5 kg)         System Check if normal Positive or additional negative findings   GEN  [x]  NAD     Eyes  []  Conj/Lids []  Pupils  []  Fundi   []  Sclerae []  EOM     ENT  []  External ears   []  Otoscopy   []  Gross Hearing    []   External nose   []  Nasal mucosa   []  Lips/teeth/gums    []  Oropharynx    []  Mucus membranes      Neck  [x]  Inspection/palpation    [x]  Thyroid     Resp  [x]  Effort    [x]  Auscultation       CV  []  Rhythm/rate   [x]  Murmurs   [x]  Edema   []  JVP non-elevated    Normal pulses:   []  Radial []  Femoral  []  Pedal     Breast  []  Inspection []  Palpation     GI  []  Bowel sounds    [x]  Nontender   [x]  No distension    []  No rebound or guarding   []  No masses   []  Liver/spleen    []  Rectal     GU  F:  []  External []  vaginal wall         []  Cervix     []  mucus        []  Uterus    []  Adnexa   M:  []  Scrotum []  Penis         []  Prostate     Lymph  [x]  Cervical []  supraclavicular     [x]  Axillae   []  Groin/inguinal     MSK []  Gait  []  Back     Specify site examined:    []  Inspect/palp []  ROM   []  Stability []  Strength/tone []  Used arms to push up from seated to standing position   Assistive device:  []  single point cane []  quad cane  []  FWW []  rollator walker      Skin  []  Inspection []  Palpation     Neuro  []  Alert and oriented     []  CN2-12 intact grossly   []  DTR      []  Muscle strength      []  Sensation   []   Pronator drift   []  Finger to Nose/Heel to Shin   []  Romberg     Psych  []  Insight/judgement     []  Mood/affect    []  Gross cognition            LABS/STUDIES     LABS:  Lab Results   Component Value Date    WBC 5.3 09/13/2020    WBC 6.10 03/23/2020    WBC 4.87 02/16/2020    HGB 11.4 09/13/2020    HGB 12.7 03/23/2020    HGB 12.4 02/16/2020    MCV 97.7 (H) 09/13/2020    PLT 300 09/13/2020    PLT 418 (H) 03/23/2020    PLT 253 02/16/2020     Lab Results   Component Value Date    NA 135 (L) 09/13/2020    NA 136 06/09/2020    NA 137 05/18/2020    K 4.09 09/13/2020    K 4.0 06/09/2020    K 4.3 05/18/2020    CREAT 1.8 (H) 09/13/2020    CREAT 1.63 (H) 06/09/2020    CREAT 1.66 (H) 05/18/2020    GFRESTNOAA 29 06/09/2020    GFRESTNOAA 28 05/18/2020    GFRESTNOAA 30 03/23/2020    GFRESTAA 34 06/09/2020 GFRESTAA 33 05/18/2020    GFRESTAA 35 03/23/2020    CALCIUM 9.3 09/13/2020    CALCIUM 9.4 06/09/2020    CALCIUM 9.2 05/18/2020     Lab Results   Component Value Date    ALT 12 09/13/2020    ALT 20 03/23/2020    AST 21 09/13/2020    AST 28 03/23/2020    ALKPHOS 63 09/13/2020    BILITOT 0.40 09/13/2020    ALBUMIN 3.3 09/13/2020    ALBUMIN 4.2 03/23/2020     Lab Results   Component Value Date    TSH 2.8 03/23/2020    TSH 1.00 12/17/2018     Lab Results   Component Value Date    HGBA1C 5.2 03/23/2020    HGBA1C 4.9 09/22/2019     No results found for: CHOL, CHOLDLCAL  No results found for: CHOLDLQ  No results found for: [facility]  Lab Results   Component Value Date    FE 87 07/14/2019    FERRITIN 57 07/14/2019    FOLATE 9.6 11/25/2019    TIBC 356 07/14/2019     Lab Results   Component Value Date    VITAMINB12 3,994 (H) 11/25/2019    VITAMINB12 217 (L) 09/22/2019     Lab Results   Component Value Date    BNP 92 09/22/2019     No results found for: PSATOTAL      STUDIES:    EKG  This data element was independently reviewed and interpreted by me     NSR NO ST or T changes    XR CHEST PA LAT 2V  September 22, 2019?  COMPARISON: [facility] December 17, 2018  ?  History: sob  ?  FINDINGS:  ?  Lungs: Clear  Heart/aorta: Normal heart size. The thoracic aorta is mildly calcified, tortuous and ectatic.  Adenopathy: None  Pleura: No effusion  Bones and Chest wall: No acute bony or body wall  findings. Osteopenia and bony maturational changes. Right upper quadrant cholecystectomy clips stable from October 2020  ?  ?  IMPRESSION:  ?  No acute findings or abnormalities related to provided history.    ASSESSMENT and PLAN     Kristia Sherratt is a 85 y.o. female who presents today  for *follow up**.          #HTN--Increased nifedipine CR 60 mg daily. Continue losartan 25 mg.    May need uptitration of losartan.  Did not take meds today.  #Aortic Calcification--noted on chest imaging     September 22, 2019?       --Not Worsening.  Chronic.  Will monitor and treat underlying risk factors with medical and lifestyle interventions as warranted by balance between benefits and burdens.  #allergic rhinitis--trial flonase  #Macrocytosis--  #Deconditioning--initiate home PT  #GERD--s/p Nisan fundoplication  #COPD--mixed--per PFT's from [name].--consider reinitiation of inhalers.  #s/p rectocele repair  #s/p cholecystectomy  #Immunosupressed status--medication related.  Chronic. CTM  #Nocturnal hypoxemia--presumably related to MG crisis but has history of COPD. ?Will order nocturnal home O2 study and consider PFT's/Pulmonary evaluation once settled down. ?May need to restart BREO inhaler.  #Myasthenia Gravis--s/p plasmapheresis x5 October 2020, no Thymoma, on Azathioprine 150 daily and mestinon 60 tid.??Saw?Dr. Enedina Finner at [facility]. ?Was considering Soliris.??Ordering IVIG and MRI.  Encourage ongoing neurology follow up.   #IBS--titrate miralax to comfort. ?Consider duloxetine substitute for buproprion.  #Interstitial cystitis--on daily Trimethoprim 100 for UTI prophylaxis. ?Using bladder instillations prn. OFF topical estrogen due to advice from neurologist re: MG.  To see Dr. Lorenz Coaster.  Suspect recent flare related to breast CA re-diagnosis.  Doubt pseudomonas bactiuria is pathogen at present.  WIll complete current meropenum.  #Major Depression--active but appears better s/p  increase Duloxetine to 40 mg daily. Referred patient to Dr. Randie Heinz previously.  #Allergic rhinitis--previously on nasal steroids.  #Breast CA--Now with bx+.  To see [place] [facility] Friday. ?Patient declined surgery in 2019 and experienced dry mouth from Tamoxifen. ?Was considering Anastrozole.Per records from [facility]:   ''07/20/2017 Screening detected right breast calcifications and distortion. The distortion was in the upper outer quadrant: Biopsy DCIS intermediate grade with CSL; calcifications UIQ: 2.8 cm: Biopsy DCIS+ ALH +CSL; 4 cm apart, axilla negative, Tis NX stage 0''  Await MRI to clarify surgical options.  #DM with CKD--stabe 3b--based on labs from [name]. ?FOllow here. ?If BP permits, may consider ARB BUT patient with h/o angioedema so will ONLY initiate this agent with great caution.  Refer to Ophthalmology.  #OA knees--previously getting hyaluronic acid injections.  #Anxiety  #Benzodiazepine dependence--back to 1.5 mg daily due to recent stress  #Fibromyalgia--duloxetine as above.   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  #Statin intolerance      FOLLOW-UP     RTC     Future Appointments   Date Time Provider Department Center   09/22/2020 11:00 AM York Ram., MD GERI IMS 420 MEDICINE   10/28/2020  2:00 PM Fredda Hammed., MD HEM/ONC 230 MEDICINE   11/18/2020  1:00 PM MP2 MR01 3T (HOSP) MRI MP2 [facility] Rad         The above plan of care, diagnosis, orders, and follow-up were discussed with the patient and/or surrogate. Questions related to this recommended plan of care were answered.    ANALYSIS OF DATA (Needs to meet 1 category for moderate and 2 categories for high LOS)     I have:     Category 1 (Needs 3 for moderate and high LOS)     []  Reviewed []  1 []  2 []  ? 3 unique laboratory, radiology, and/or diagnostic tests noted below    Test/Study:  on date .    []  Reviewed []  1 []  2 []  ? 3 prior external notes and incorporated into patient  assessment    I reviewed Dr. 's note in specialty  from date .    []  Discussed management or test interpretation with external provider(s) as noted      []  Ordered []  1 []  2 []  ? 3 unique laboratory, radiology, and/or diagnostic tests noted in A&P    []  Obtained history from independent historian:       Category 2  []  Independently interpreted the test    Category 3  []  Discussed management or test interpretation with external provider(s) as noted      INTERACTION COMPLEXITY and SOCIAL DETERMINANTS of HEALTH       PROBLEM COMPLEXITY   []  New problem with uncertain diagnosis or prognosis (moderate)   []  Multiple stable chronic problems (moderate)   []  Chronic problem not stable - not controlled, symptomatic, or worsening (moderate)   []  Severe exacerbation of chronic problem (high)   []  New or chronic problem that poses threat to life or bodily function (high)     MANAGEMENT COMPLEXITY   []  Old or external/outside records reviewed   []  Discussion with alternate (proxy) if patient with impaired communication / comprehension ability (e.g., dementia, aphasia, severe hearing loss).   []  Repeated questions (or disagreement) between patient and/among caregivers/family during the visit.  []  Caregiver/patient emotions/behavior/beliefs interfering with implementation of treatment plan.  []  Independent interpretation of test (EKG, Chest XRay)   []  Discussion of case with a consultant physician     RISK LEVEL   []  Prescription drug management (moderate)   []  Minor surgery with CV risk factors or elective major surgery (moderate)   []  Dx or Rx significantly limited by SDoH (inadequate housing, living alone, poor health care access, inappropriate diet; low literacy) (moderate)   []  Major surgery - elective with CV risk factors or emergent (high)   []  Need for hospitalization (high)   []  New DNR or de-escalation of care (high)      SDoH  The diagnosis or treatment of said conditions is significantly limited by the following social determinants of health:  []  Z59.0 Homelessness  []  Z59.1 Inadequate housing  []  Z59.2 Discord with neighbors, lodgers and landlord  []  Z60.2 Problems related to living alone  []  Z59.8 Other problems related to housing and economic circumstances  []  Z59.4 Lack of adequate food and safe drinking water  []  Z59.6 Low income  []  Z59.7 Insufficient social insurance and welfare support  []  Z59.9 Problems related to housing and economic circumstances, unspecified  []  Z75.3 Unavailability and inaccessibility of health care facilities  []  Z75.4 Unavailability and inaccessibility of other helping agencies  []  Z72.4 Inappropriate diet and eating habits  []  Z62.820 Parent-biological child conflict  []  Z63.8 Other specified problems related to primary support group  []  Z55.0 Illiteracy and low level literacy   []  Z56.9 Unspecified problems related to employment        If Billing Based on Time:     I performed the following items on the day of service:    [x]  Preparing to see the patient (e.g., review of tests)  [x]  Obtaining and/or reviewing separately obtained history   [x]  Performing a medically appropriate examination and/or evaluation   [x]  Counseling and educating the patient/family/caregiver   [x]  Ordering medications, tests, or procedures  [x]  Referring and communicating with other healthcare professionals (when not separately reported)  [x]  Documenting clinical information in the EHR  []  Independently interpreting results and communicating results to patient/family/caregiver    I spent the following total  amount of time on these tasks on the day of service:  New Patient     Established Patient  []  15-29 minutes - 99202    []  up to 9 minutes - 99211  []  30-44 minutes - 99203     []  10-19 minutes - 99212   []  45-59 minutes - 99204      []  20-29 minutes - 99213   []  60-74 minutes - 99205   []  30-39 minutes - 99214         [x]  40-55 minutes - 99215    []  I spent an additional ** * 15-minute-increment(s) for a total of * ** minutes on these tasks on the day of service. [phone] for each additional 15 minutes.)    Author: York Ram, MD 09/22/2020

## 2020-09-23 ENCOUNTER — Ambulatory Visit: Payer: BLUE CROSS/BLUE SHIELD

## 2020-09-29 ENCOUNTER — Ambulatory Visit: Payer: BLUE CROSS/BLUE SHIELD

## 2020-10-06 ENCOUNTER — Telehealth: Payer: BLUE CROSS/BLUE SHIELD

## 2020-10-06 NOTE — Telephone Encounter
Medication Prior Authorization    Name of medication: Gammagard     [facility] faxed a PA request on 10/03/20    Prescribed date: 04/16/19      Is there an alternative covered medication? no    Has patient taken any other medications for this matter? no    [employer] number: ph # [phone]      Insurance company fax number: fx # [phone]      Patient or caller has been notified of the 24-48 hour turnaround time.

## 2020-10-06 NOTE — Telephone Encounter
From: Laurena Slimmer C.   Sent: Thursday, October 06, 2020 12:51 PM  To: Rita Ohara [email or web address]  Subject: PA Request     Hello Velva Harman,     I received a message in regards to patient [name] for PA request for her Gammagard medication.   Please let me know what is needed for re-auth of her medication.     Thank you,   Riki Sheer.

## 2020-10-07 NOTE — Progress Notes
HEMATOLOGY/ONCOLOGY OUTPATIENT NOTE    PATIENT: [name]  MRN: [id number]  DOB: 08-29-1935  DATE OF SERVICE: 10/28/2020  Referring MD: York Ram., MD    Reason for Consultation/ Chief Complaint:  Bilateral breast cancer    HPI   [name] is a 85 y.o. female with PMH including myasthenia gravis, asymmetric hearing loss, fibromyalgia, DM with CKD, major depression, interstitial cystitis, IBS, COPD who presents to establish care for breast cancer.     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 07/19/2017 Screening detected right breast calcifications and distortion in the upper outer quadrant: Biopsy DCIS+ ALH +CSL; 4 cm apart, axilla negative.    Neoadjuvant tamoxifen started 10/30/17, stopped after 4-6 weeks due to dry mouth and mouth sores. Switched to anastrozole 01/29/18, discontinued because it made her throat dry. Declined additional antiestrogen therapy.   Moved on October of 2020 to [place]   ?  [name] then noted left breast pain in April 2022.     Underwent a diagnostic bilateral breast imaging 06/13/2020 which revealed:   ?- Right breast: solid mass with indistinct margins measuring 14 x 4 x 10 mm in the right breast at 1 o'clock located 6 centimeters from the nipple with associated microclip,  not parallel solid mass with irregular margins measuring 8 x 4 x 5 mm seen in the right breast at 12 o'clock, upper outer quadrant located 6 centimeters from the nipple.   ?- Left breast: irregular mass with angular margins measuring 10 x 9 x 9 mm seen in the left breast at 11 o'clock located 5 centimeters from the nipple, axillary lymph node in the left axilla with cortical thickness measuring up to 6 mm.    R breast biopsy at 12:00, 6 cm from nipple on 07/13/2020 revealed invasive ductal carcinoma with lobular and focal cribriform features, grade 2 ER (+) PR (+) HER2 (-), Ki-67 5%    L breast biopsy at 11:00, 5 cm from nipple on 07/13/2020 revealed invasive ductal carcinoma with lobular features, grade 2 ER (+) PR (-) HER2 (-), Ki-67 10%  ?  Breast MRI performed on 08/05/2020 that redemonstrated multifocal right breast malignancy and left breast IDC with an additional 4.3cm of NME that spanned nearly the entire left breast that appears suspicious.     [facility] genetic testing (-) for pathogenic variants.     08/17/2020 Patient presents to establish care for breast cancer. She has seen Dr. Janee Morn to discuss surgical treatment, and is quite worried about anesthesia with her myasthenia gravis . She is interested in neoadjuvant therapy     09/13/2020 Patient presents for follow-up of breast cancer. PETCT on 08/22/2020 with no evidence of FDG avid metastatic disease. DXA scan on 08/26/2020 demonstrated osteopenia of the left femoral neck and total hip.     10/28/2020 Patient presents for follow-up of breast cancer. Labs on 09/13/2020 showed WBC 5.3, hgb 11.4, platelets 300, ANC 3.58.        ECOG performance status: 0  PAST HISTORY   PMH:  Patient Active Problem List    Diagnosis Date Noted   ? Immunosuppressed status (HCC/RAF) 03/21/2020   ? HTN (hypertension) 10/31/2019   ? Aortic calcification (HCC/RAF) 09/25/2019   ? Refractive error 04/28/2019   ? Ptosis of eyelid 04/28/2019   ? Pseudophakia of both eyes 04/28/2019   ? Benzodiazepine dependence (HCC/RAF) 12/20/2018   ? Breast cancer (HCC/RAF) 12/20/2018   ? Spondylosis without myelopathy  or radiculopathy, lumbar region 11/28/2018   ? Anxiety 11/27/2018   ? CKD (chronic kidney disease), stage III (HCC/RAF) 11/27/2018   ? Nocturnal hypoxemia 04/07/2018     Last Assessment & Plan:   We discussed potential impact of hypoxemia from her COPD on other body function  Plan-overnight oximetry     ? Major depression, recurrent, chronic (HCC/RAF) 12/16/2017   ? Diabetes mellitus (HCC/RAF) 12/16/2017   ? Osteoarthritis of knee 05/17/2017   ? H/O arthroscopy 03/06/2017   ? Myasthenia gravis with exacerbation (HCC/RAF) 12/18/2016   ? Vaginal atrophy 08/16/2016   ? Acute medial meniscal tear, left, subsequent encounter 07/18/2016   ? Renal cyst 03/12/2016   ? Asthmatic bronchitis, mild persistent, with acute exacerbation 12/29/2015     Last Assessment & Plan:   Continue rescue inhaler as needed. Try adding sample Bevespi maintenance inhaler to see if this helps.  She is currently on prednisone 20 mg daily maintenance and I don't think adding a steroid inhaler component will make any difference.     ? Urethral caruncle 07/26/2015   ? Chronic cholecystitis 01/14/2015   ? Angioedema 05/23/2014     Attributed to injected methylprednisolone    Last Assessment & Plan:   We discussed limited ability to test for possible triggers. We are asking our labs sources about ability to test against methylprednisolone for injection.  A different brand might be worth trying. She can try pre-medicating with an antihistamine.     ? Chronic interstitial cystitis 10/01/2011   ? Insomnia 05/31/2007     Qualifier: Diagnosis of   By: Maple Hudson MD, Clinton D    Last Assessment & Plan:   I'm concerned that some of her sleep problems may reflect obstructive sleep apnea based on her palate and which she can tell me about her sleep patterns without a witness. We also want to know about oxygenation during sleep.  Plan-schedule sleep study  Qualifier: Diagnosis of   By: Maple Hudson MD, Clinton D      Last Assessment & Plan:   I'm concerned that some of her sleep problems may reflect obstructive sleep apnea based on her palate and which she can tell me about her sleep patterns without a witness. We also want to know about oxygenation during sleep.  Plan-schedule sleep study     ? COPD mixed type (HCC/RAF) 04/30/2007     Office Spirometry 03/16/2016-severe obstructive airways disease FVC 1.59/59%, FEV1 0.98/49%, ratio 0.62, FEF 25-75% 0.42/28%  PFT 04/16/16-severe obstructive airways disease with slight response to bronchodilator, severe diffusion defect. FEV1/FVC 0.64, DLCO 45%    Last Assessment & Plan:   Severe obstructive airways disease. She can't hear herself wheeze.  Plan-try samples of Trelegy instead of Anoro for comparison    Overview:   Office Spirometry 03/16/2016-severe obstructive airways disease FVC 1.59/59%, FEV1 0.98/49%, ratio 0.62, FEF 25-75% 0.42/28%  PFT 04/16/16-severe obstructive airways disease with slight response to bronchodilator, severe diffusion defect. FEV1/FVC 0.64, DLCO 45%    Last Assessment & Plan:   Severe obstructive airways disease. She can't hear herself wheeze.  Plan-try samples of Trelegy instead of Anoro for comparison  Office Spirometry 03/16/2016-severe obstructive airways disease FVC 1.59/59%, FEV1 0.98/49%, ratio 0.62, FEF 25-75% 0.42/28%  PFT 04/16/16-severe obstructive airways disease with slight response to bronchodilator, severe diffusion defect. FEV1/FVC 0.64, DLCO 45%    Last Assessment & Plan:   Severe COPD but mainly emphysema with limited bronchospasm component that could be addressed with bronchodilators.  Plan-try sample Bevespi as  a maintenance controller, refill pro-air     ? Esophageal reflux 04/30/2007     History of fundoplication     Last Assessment & Plan:   Emphasized continued attention to antireflux measures. Reminded that reflux can be a significant trigger for irritable airways, cough and wheeze.  History of fundoplication       Last Assessment & Plan:   Emphasized continued attention to antireflux measures. Reminded that reflux can be a significant trigger for irritable airways, cough and wheeze.     ? 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 Management is mostly by her ENT physicians.     ? Fibromyalgia 04/30/2007     Qualifier: Diagnosis of   By: Yetta Barre CNA/MA, Rose Fillers: Diagnosis of   By: Yetta Barre CNA/MA, Shanda Bumps     ? Seasonal and perennial allergic rhinitis 04/30/2007 Qualifier: Diagnosis of   By: Yetta Barre CNA/MA, Shanda Bumps     Last Assessment & Plan:   She is now on prednisone 20 mg daily. I suggested she wait until her myasthenia status is stabilized. Then if she needs to she couldn't seek evaluation at one of the allergy practices in town as discussed, since our vaccine program will be closing.  Qualifier: Diagnosis of   By: Yetta Barre CNA/MA, Jessica       Last Assessment & Plan:   She is now on prednisone 20 mg daily. I suggested she wait until her myasthenia status is stabilized. Then if she needs to she couldn't seek evaluation at one of the allergy practices in town as discussed, since our vaccine program will be closing.     Tobacco: smoked  For 30 years     Surgical History:  No past surgical history on file.    Gyn Hx:  Menses 15   G2P2- first birth 62  Menopause :50's   Menopausal status: Postmenopausal  Hormone therapy use: None      Family and Social History  No family history on file.  Social History     Tobacco Use   ? Smoking status: Former Smoker     Quit date: 1985     Years since quitting: 37.6   ? Smokeless tobacco: Former Estate agent Use Topics   ? Alcohol use: Not Currently       MEDS     No outpatient medications have been marked as taking for the 10/28/20 encounter (Appointment) with Fredda Hammed., MD.     Allergies   Allergen Reactions   ? Beta Adrenergic Blockers Other (See Comments)     Avoid d/t Myasthenia Gravis  Avoid d/t Myasthenia Gravis     ? Levofloxacin Other (See Comments)     Flare of Myasthenia Gravis   ? Macrolides And Ketolides Other (See Comments)     Avoid d/t Myasthenia Gravis  Use with caution d/t Myasthenia Gravis  Use with caution d/t Myasthenia Gravis     ? Sulfa Antibiotics Other (See Comments)     May possibly have caused deafness in one ear  May possibly have caused deafness in one ear  other     ? Botulinum Toxins Other (See Comments)     Muscle weakness r/t Myasthenia Gravis  Muscle weakness r/t Myasthenia Gravis     ? Statins Other (See Comments)  Muscle aches  Muscle aches  Muscle aches     ? Plastic Tape Itching and Other (See Comments)     Turns the skin red where applied       PHYSICAL EXAM   There were no vitals filed for this visit.   There is no height or weight on file to calculate BMI.  Wt Readings from Last 3 Encounters:   09/22/20 68.1 kg (150 lb 1.6 oz)   09/13/20 67.9 kg (149 lb 12.8 oz)   08/17/20 64 kg (141 lb)       Review of Systems:  Pertinent items are noted in HPI.      Physical Exam:  General: No acute distress. Appears well-developed, well-nourished and close to stated age.   Head: Normocephalic, atraumatic.  Eyes: Sclera anicteric. EOMI.  ENT: Hearing grossly normal bilaterally. Oropharynx is clear, mucus membranes are moist.  No oral ulcers noted. Good dentition.  Neck: Supple. Trachea midline.   Cardiac: Regular rate and rhythm. Normal S1, S2. No murmurs, rubs, or gallops.  Respiratory: Clear to auscultation bilaterally. No wheezes, rales, or rhonchi noted. Respiratory effort appears normal.   Abdomen: Soft, nontender and nondistended. Bowel sounds are present and normoactive. No organomegaly is appreciated.  Musculoskeletal: No edema. No cyanosis. Extremities are warm and well-perfused.   Extremities: Warm. No edema. No cyanosis.  Neurologic: Gait appears normal. Sensation intact to light touch in all four extremities. Oriented to person, place and time.   Hematologic: No bruising, purpura or petechiae are noted.   Dermatologic: Skin intact.  No rashes appreciated.   Lymphatic: No palpable cervical, supraclavicular, axillary or inguinal adenopathy appreciated.   Psychiatric: Affect appropriate.  Pleasant and conversant.   Breast exam: no breast masses or axillary adenopathy noted     Recent Labs:  Lab Results   Component Value Date    WBC 5.3 09/13/2020    HGB 11.4 09/13/2020    HCT 34.1 09/13/2020    MCV 97.7 (H) 09/13/2020    PLT 300 09/13/2020      Lab Results   Component Value Date    NA 135 (L) 09/13/2020    K 4.09 09/13/2020    CL 103.5 09/13/2020    CO2 26.2 09/13/2020    CREAT 1.8 (H) 09/13/2020    BUN 22.0 09/13/2020     Lab Results   Component Value Date    ALT 12 09/13/2020    AST 21 09/13/2020    ALKPHOS 63 09/13/2020    BILITOT 0.40 09/13/2020     Lab Results   Component Value Date    CALCIUM 9.3 09/13/2020       Pertinent imaging:  08/26/2020 DXA lumbar spine/hip  -----------------------------------------------------------------   Region ? ? ? ? ? ? ? ? ? BMD ? ?T-score ?Z-score ? Classification   -----------------------------------------------------------------   AP Spine(L1-L4) ? ? ? ? ?1.062 ? ?0.1 ? ? ?2.9 ? ? ? Normal   Femoral Neck (Left) ? ? ?0.723 ? -1.1 ? ? ?1.3 ? ? ? Osteopenia   Total Hip (Left) ? ? ? ? 0.725 ? -1.8 ? ? ?0.4 ? ? ? Osteopenia   -----------------------------------------------------------------   Impression: The BMD for  the AP Spine(L1-L4) decreased, changing by -2.4% since the last DXA exam.     08/22/2020 PETCT  1.  Mild to moderate left greater than right FDG activity associated with small soft tissue densities in the bilateral breasts in the regions marked by breast biopsy clips.   2.  No evidence of FDG avid metastatic disease.    08/05/2020 MRI breast  1. Right breast:   a) Linear nonmass enhancement measuring 13 mm at 11:00, 5 cm deep to the nipple corresponds to the suspicious architectural distortion seen mammographically. This likely represents the same process as the known malignancy marked by the X microclip from   outside biopsy, which lies 13 mm anterior to this enhancement. Surgical and oncologic management is recommended. BI-RADS CATEGORY 6.  b) Irregular mass containing a microclip measuring 10 mm at 12:00, 6 cm deep to the nipple is a known malignancy. Surgical and oncologic management is recommended. BI-RADS CATEGORY 6.  c) Focal nonmass enhancement containing a microclip from a biopsy performed outside facility measuring 16 mm at 9:00, 2 cm deep to the nipple is a known malignancy. Surgical and oncologic management is recommended. BI-RADS CATEGORY 6.  ?  2. Left breast:   a) Irregular mass containing a microclip measuring 15 mm at 11:00, 5 cm deep to the nipple is a known malignancy. Surgical and oncologic management is recommended. BI-RADS CATEGORY 6.  b) Nonmass enhancement measuring 43 mm throughout the majority of the left breast is suspicious. If breast conservation is a consideration recommend MRI guided biopsy of the anterior-inferior aspect of the nonmass enhancement. BI-RADS CATEGORY 4  c) Oval enhancing mass measuring 5 mm with suggestion of a fatty hilum lies at 4:00, 7 cm deep to the nipple is probably benign and likely represents an intramammary lymph node. Recommend second look ultrasound if there is no sonographic correlate then   short-term MRI follow-up in 6 months is recommended. BI-RADS CATEGORY 3.  d) No MRI correlate for prominent axillary lymph node seen on prior ultrasound. Management as detailed on prior ultrasound report is recommended. BI-RADS CATEGORY 2.  ?  OVERALL ASSESSMENT: BI-RADS CATEGORY 4     06/13/2020 Mammogram/US bilat breasts, diagnostic  IMPRESSION:  Right breast:  1. Mammographic architectural distortion/sonographic mass at 12:00 6 cm from nipple are suspicious.  Recommend ultrasound-guided biopsy.  2. Two biopsied areas in the right breast are known DCIS and ADH. Appropriate action should be taken.  3. Calcifications in the upper outer quadrant of the right breast at  posterior depth located 5 centimeters from the nipple are suspicious. Await results from biopsy at  site of distortion/12:00.  4. Additional solid masses in the right breast at 12:00 and 2 o'clock are probably benign.  Sonographic follow-up in 6 months is recommended tentatively.    Left breast:  1. Irregular mass in the left breast at 11 o'clock is highly suggestive  of malignancy. An ultrasound guided biopsy is recommended.  2. Vague hypoechoic area in the left breast at 12 o'clock, upper outer  quadrant is probably benign. Sonographic follow-up in 6 months is recommended.  3. Solid mass in the left breast at 1 o'clock is probably benign.  Sonographic follow-up in 6 months is recommended.  4. There is a lymph node in the left axilla with cortical  thickness measuring up to 6 mm. Surgical consultation is recommended.  BI-RADS Category 5:  Highly Suggestive for Malignancy    09/22/2019 XR chest  No acute findings or abnormalities related to provided history.    06/19/2019 US pelvis  Small subserosal uterine fundal fibroid. Otherwise normal postmenopausal pelvic ultrasound.        Pertinent pathology:  08/02/2020 [facility] genetics      07/13/2020 Tissue exam  A. BREAST, RIGHT, MASS, 12:00, 6 CM FROM NIPPLE (NEEDLE CORE BIOPSY):  -  Invasive ductal carcinoma with lobular and focal cribriform features, grade 2 (50% of biopsy; longest segment 4 mm)             Modified Bloom and Richardson score: 6 of 9                        Tubule formation:                3                        Nuclear pleomorphism:       2                        Mitotic score:                       1  - Ductal carcinoma in situ (DCIS), low nuclear grade, cribriform type with focal central necrosis  - Breast biomarkers: See below  - ERBB2 (HER2) FISH: Negative for HER2 gene amplification   ?  B.  BREAST, LEFT, MASS, 11:00, 5 CM FROM NIPPLE (NEEDLE CORE BIOPSY):  - Invasive ductal carcinoma with lobular features, grade 2 (50% of biopsy; longest involved segment 6 mm)             Modified Bloom and Richardson score: 6 of 9                        Tubule formation:                3                        Nuclear pleomorphism:       2                        Mitotic score:                       1  - Ductal carcinoma in situ (DCIS) is not identified  - Breast biomarkers: See immunohistochemistry report below  - ERBB2 (HER2) FISH: Negative [based on IHC (2+) and FISH, see comment].     COMMENT: It is uncertain whether patients with an average of >4.0 and <6.0 HER2?signals per cell and a HER2/CEP17 ratio of <2.0 benefit from HER2-targeted therapy in the absence of protein overexpression (IHC 3+). If the specimen test result is close to the [facility] ratio threshold for positive, there is a higher likelihood that repeat testing will result in different results by chance alone. Therefore, when [facility] results are not 3+ positive, it is recommended that the sample be considered negative without additional testing on the specimen.    BREAST BIOMARKER REPORT  ?  BLOCK:  A1  FIXATIVE:  Formalin   ?  RESULT ER/PR:  ? ESTROGEN   RECEPTOR PROGESTERONE RECEPTOR   Antibody Clone SP1 Clone 636   %Tumor Staining >95% >95%   Intensity (1+ to 3+) 3+ 3+   ?  RESULT HER-2/neu IHC assay (utilizing FDA-approved DAKO Hercep Test): Arch Pathol Lab Med [phone], ASCO/CAP HER2 Testing in Breast Cancer Update - Veronia Beets al  ?  Test Score: 1+  ?  HER2/neu:  Negative  ?  RESULT Ki-67 (Clone MIB1):  5%  ?  BREAST BIOMARKER REPORT  ?  BLOCK:  B1  FIXATIVE:  Formalin   ?  RESULT ER/PR:  ? ESTROGEN   RECEPTOR PROGESTERONE RECEPTOR   Antibody Clone SP1 Clone 636   %Tumor Staining >95% 0%   Intensity (1+ to 3+) 3+ NA   ?  RESULT HER-2/neu IHC assay (utilizing FDA-approved DAKO Hercep Test): Arch Pathol Lab Med [phone], ASCO/CAP HER2 Testing in Breast Cancer Update - Veronia Beets al  ?  Test Score: 2+  ?  HER2/neu:  Equivocal  ?  RESULT Ki-67 (Clone MIB1):  10%    07/19/2017 Outside pathology        ASSESSMENT & PLAN   [name] is a 85 y.o. female presents for     1. 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 She elected not to have surgery at that time. She took tamoxifen-->anastrozole for a short period of time, but discontinued endocrine therapy due to side effects.   - R breast biopsy at 12:00, 6 cm from nipple on 07/13/2020 revealed invasive ductal carcinoma with lobular and focal cribriform features, grade 2 ER (+) PR (+) HER2 (-), Ki-67 5%  - L breast biopsy at 11:00, 5 cm from nipple on 07/13/2020 revealed invasive ductal carcinoma with lobular features, grade 2 ER (+) PR (-) HER2 (-), Ki-67 10%  - We had a long discussion about biology and treatment of breast cancer, with emphasis that cure can only be attained with multimodality treatments such as surgery/ +/- radiation and systemic ( endocrine) therapy .  We have discussed that given her concern for anesthesia with Myasthenia Gravis, we cat try neoadjuvant endocrine therapy with close observation of side effects.  - We will start letrozole 2.5 mg po daily and follow up closely. If she is unable to tolerate, we will switch to exemestane, and if that is not an option we can try fulvestrant ( estrogen receptor down regulator)   - tumors are not palpable on exam, and she would have to be followed with imaging     09/13/2020  -  PETCT on 08/22/2020 with no evidence of FDG avid metastatic disease.   - Continue Letrozole 2.5 mg daily  - she will let us know of her progress        2.  Bone density  - DXA scan on 08/26/2020 demonstrated osteopenia of the left femoral neck and total hip.      3.  Myasthenia gravis   - F/u w Dr. Claudie Leach, Neurology, on 08/17/2020. No contraindication to possible surgery for DCIS. If she is to require surgery, we would likely ensure she has an IVIG infusion within 1 week prior to the procedure. EMG/NCS on 09/19/20 at 2:30 PM. To evaluate numbness and tingling in hands. Discontinue Mestinon (TID PRN; taking once daily recently). Monitor for improvement in hand cramps. Continue Azathioprine 75 mg BID (refilling today). Continue IVIG 60g (1g/kg) every 3 weeks ([place])         DIAGNOSES/ORDERS addressed on 10/28/2020:  1. Breast cancer (HCC/RAF)      - on Letrozole - continue close monitoring for toxicity of intensive drug therapy.    NEW PROBLEMS AS OF 10/28/2020:     []  New minor or self-limited problem (99202/99212).  []  2+ minor or self-limited problem, stable chronic illness, acute uncomplicated illness/injury (99203/99213).  []  Chronic illness with exacerbation/progression/tx side effect; 2+ stable  chronic illnesses, new problem with uncertain prognosis, acute illness with systemic symptoms, or acute complicated injury (99204/99214).  []  Chronic illness with severe exacerbation/progression/tx side effect, acute/chronic illness or injury that poses threat to life or bodily function (99205/99215).      REVIEW OF DATA   I have  [x]  Reviewed/ordered []  1 []  2 [x]  ? 3 unique laboratory, radiology, and/or diagnostic tests noted below  No orders of the defined types were placed in this encounter.    08/26/2020 DXA lumbar spine/hip  08/22/2020 PETCT  08/05/2020 MRI breast    [x]  Reviewed []  1 []  2 [x]  ? 3 prior external notes and incorporated into patient assessment   I reviewed Dr. Velvet Bathe, MD in Surgery, Surgical Oncology on 08/10/2020.  I reviewed Dr. York Ram, MD in Medicine, Internal Medicine on 07/13/2020.  I reviewed Dr. Johnney Killian. Desma Maxim, MD in Obstetrics & Gynecology on 05/07/2019.    []  Discussed management or test interpretation with external provider(s) on 10/28/2020 as noted:    RISK OF COMPLICATION   This writer has deemed the above diagnoses to have a risk of complication, morbidity or mortality of:   []  Minimal  []  Low  []  Moderate   []  due to prescription drug management   [] Decision re: minor surgery   [] Diagnosis or treatment limited by social determinants of health  []  Severe    []  due to intensive monitoring for chemotherapy toxicity   []  Decision elective surgery with risk factors  []  Decision regarding need for hospitalization  []  Advanced Care Planning decisions    SOCIAL DETERMINANTS OF HEALTH   []  The diagnosis or treatment of said conditions is significantly limited by social determinants of health:      I spent 30 minutes counseling and coordinating care for the items checked below on the day of service 10/28/2020  [x]  Preparing to see the patient (e.g., review of tests)  [x]  Obtaining and or reviewing separately obtained history   [x]  Performing a medically appropriate examination and/or evaluation   [x]  Counseling and educating the patient/family/caregiver   [x]  Ordering medications, tests, or procedures  []  Referring and communicating with other healthcare professionals (when not separately reported)  [x]  Documenting clinical information in the EHR  []  Independently interpreting results and communicating results to patient/ family/caregiver     No follow-ups on file.       The above recommendation were discussed with the patient. The patient has all questions answered satisfactorily and is in agreement with this recommended plan of care.    Scribe Signature:  I, Salley Scarlet, have scribed for [employer]. Oscar La, MD on this encounter with [name] at the [facility] in [place] on 10/28/2020 at 10:21 AM 10/28/2020.     Physician Signature:  I have reviewed this note, scribed by Salley Scarlet, I have edited and attest that it is an accurate representation of my evaluation.   Rhona Leavens. Oscar La, MD 10/28/2020 at 10:21 AM

## 2020-10-08 ENCOUNTER — Ambulatory Visit: Payer: BLUE CROSS/BLUE SHIELD

## 2020-10-10 ENCOUNTER — Telehealth: Payer: BLUE CROSS/BLUE SHIELD

## 2020-10-10 NOTE — Telephone Encounter
Message to Practice/Provider      Message: Patient calling regarding mychart message. Patient stated that [employer] has sent request for a renewal of Gamunex orders, since they are unable to continue filling medication until they received approval. Patient stated she has missed 1 infusion appointment.    Return call is not being requested by the patient or caller.    Patient or caller has been notified of the 24-48 hour processing turnaround time if applicable.

## 2020-10-24 ENCOUNTER — Telehealth: Payer: BLUE CROSS/BLUE SHIELD

## 2020-10-24 NOTE — Telephone Encounter
From: Laurena Slimmer C.   Sent: Thursday, October 06, 2020 4:15 PM  To: Rita [email or web address]  Subject: RE: PA Request     Hi Electa Sniff! Just wanted to make sure nothing else was needed from me. J    LOL no worries! I completely understand when it comes to insurances!!   I will be here all week next week! Everlena Cooper M.     From: Rita Ohara [email or web address]>   Sent: Thursday, October 06, 2020 2:42 PM  To: Gunnar Bulla, Shadrach Bartunek C. [email or web address]  Subject: RE: PA Request     CAUTION - EXTERNAL EMAIL:Do not click links or open attachments unless you recognize the sender.  Hi Imagene Gurney -     Happy Thursday! Yes that should have been from one of my pharmacists, Gabby. We just found out this week that Ms. [place] changed. She is currently on Gammagard, however her new formulary requires Gamunex. The call was regarding this, however it looks like we received signed orders back from you and Dr. Jefm Bryant already ?? Of course if there is any concern let me know and I can insurance to override to keep her on Gammagard if needed. I know Dr. Jefm Bryant typically preferred Gamunex in the past.     PS - I never made it out of my home office this morning, I have been on with insurance almost all day expediting a couple urgent MG cases this morning. I definitely want to catch up next week if you will be in and available ??       ____________________     Rita Ohara  IVIg/SQIg Infusion Specialist   Neurology & Rheumatology, [place]     Cell: [phone]  Fax: [phone] (IVIG)  [email or web address]

## 2020-10-24 NOTE — Telephone Encounter
Call Back Request      Reason for call back: Patient is calling to schedule a return appointment.    Please assist at earliest convenience.    Thank you     Any Symptoms:  []  Yes  [x]  No       If yes, what symptoms are you experiencing:    o Duration of symptoms (how long):    o Have you taken medication for symptoms (OTC or Rx):      Patient or caller has been notified of the 24-48 hour turnaround time.

## 2020-10-25 NOTE — Telephone Encounter
Scheduled f/u in [facility] clinic

## 2020-10-28 ENCOUNTER — Ambulatory Visit: Payer: BLUE CROSS/BLUE SHIELD | Attending: Hematology & Oncology

## 2020-10-28 MED ORDER — ANASTROZOLE 1 MG PO TABS
1 mg | ORAL_TABLET | Freq: Every day | ORAL | 11 refills | Status: AC
Start: 2020-10-28 — End: ?

## 2020-10-28 NOTE — Nursing Note
Patient presented to clinic for blood draw. Labs drawn via venipuncture from patients left arm.  \     Description Code Dx Service Date Service Prov Modifiers Qty Status                 OFFICE/OUTPT [name] 49449 CPT  10/28/2020 Kathi Simpers., MD  1 Filed     METABOLIC PANEL,COMPREHENSIVE 67591 CPT  10/28/2020 [name], [place]  1 Filed     COMPLETE CBC & AUTO DIFF WBC 63846 CPT  10/28/2020 [name], [place]  1 Filed     COLLECTION VENOUS [name] 65993 CPT  10/28/2020 [name], MA  1 Filed

## 2020-10-28 NOTE — Patient Instructions
Take two imodium after each loose stool.   You can also take metamucil.   Stop Letrozole, and start taking Anastrozole.   Send me a message after you have your MRI.

## 2020-11-02 NOTE — Progress Notes
OUTPATIENT GERIATRICS CLINIC NOTE    PATIENT:  [name]   MRN:  [id number]  DOB:  1935/02/27  DATE OF SERVICE:  11/03/2020  PRIMARY CARE PHYSICIAN: York Ram., MD    CHIEF COMPLAINT:   No chief complaint on file.      [place] Specialists:  [employer]    Non-[place] Specialists:    Chaperone status:  No data recorded    HISTORY OF PRESENT ILLNESS     [name] is a 85 y.o. female who presents today for *follow up**. Patient is accompanied by *no one**. History today is per the patient,and review of available recent records in [facility] and Care Everywhere.     Patient is a(n) [x]  reliable []  unreliable historian and additional collateral information was obtained during the visit today from:         Getting tired easily.  Taking rare clonazepam--has not had any in 3-4 days!  Changed to anastrazole due to diarrhea.    Per chart review, pertinent medical history:  Past Medical History:   Diagnosis Date   ? Breast cancer (HCC/RAF) 12/20/2018     No past surgical history on file.    ALLERGIES     Allergies   Allergen Reactions   ? Beta Adrenergic Blockers Other (See Comments)     Avoid d/t Myasthenia Gravis  Avoid d/t Myasthenia Gravis     ? Levofloxacin Other (See Comments)     Flare of Myasthenia Gravis   ? Macrolides And Ketolides Other (See Comments)     Avoid d/t Myasthenia Gravis  Use with caution d/t Myasthenia Gravis  Use with caution d/t Myasthenia Gravis     ? Sulfa Antibiotics Other (See Comments)     May possibly have caused deafness in one ear  May possibly have caused deafness in one ear  other     ? Botulinum Toxins Other (See Comments)     Muscle weakness r/t Myasthenia Gravis  Muscle weakness r/t Myasthenia Gravis     ? Statins Other (See Comments)     Muscle aches  Muscle aches  Muscle aches     ? Plastic Tape Itching and Other (See Comments)     Turns the skin red where applied        MEDICATIONS     Personally reviewed.    Medications that the patient states to be currently taking Medication Sig   ? anastrozole 1 mg tablet Take 1 tablet (1 mg total) by mouth daily.   ? ascorbic acid 500 mg tablet Take 1 tablet (500 mg total) by mouth two (2) times daily with meals.   ? azaTHIOprine 50 mg tablet Take one and one-half tablet (75 mg total) by mouth two (2) times daily.   ? buPROPion, SR, (WELLBUTRIN SR) 200 mg 12 hr tablet Take 1 tablet (200 mg total) by mouth two (2) times daily.   ? clonazePAM 0.5 mg tablet .25 in am and 0.5 in pm.   ? clonazePAM 1 mg tablet 0.5 mg in am and 1 mg in pm.   ? DEXILANT 60 MG DR capsule Take 60 mg by mouth daily.   ? estradiol (ESTRACE) 0.1 mg/g vaginal cream Place 1 g vaginally three (3) times a week.   ? fluticasone 50 mcg/act nasal spray Spray 1 spray by nasal route daily.   ? fluticasone-vilanterol (BREO ELLIPTA) 100-25 mcg/inh inhaler Inhale 1 puff daily. (Patient taking differently: Inhale 1 puff as needed for.)   ? GAMMAGARD  30 GM/300ML SOLN Every 2 weeks.   ? GAMUNEX-C 10 GM/100ML infusion    ? GAMUNEX-C 20 GM/200ML infusion    ? losartan 25 mg tablet Take 1 tablet (25 mg total) by mouth daily.   ? meropenem 1 g injection    ? methenamine hippurate 1 g tablet Take 1 tablet (1 g total) by mouth two (2) times daily.   ? montelukast 10 mg tablet Take 10 mg by mouth as needed for.   ? pyridostigmine 60 mg tablet Take 1 tablet (60 mg total) by mouth three (3) times daily as needed.   ? trimethoprim 100 mg tablet Take 1 tablet (100 mg total) by mouth daily.   ? [DISCONTINUED] DULoxetine 20 mg DR capsule Take 2 capsules (40 mg total) by mouth daily.       SOCIAL HISTORY     Social History     Social History Narrative   ? Not on file        FUNCTIONAL STATUS     BADLs:  IADLs:    Ambulates with   Falls in past year:  Afraid of falling:    GERIATRIC REVIEW OF SYSTEMS     Vision:    []   glasses     []  legally blind  Hearing: []  hearing aids    Nutrition: []  normal   []  impaired  []  vegan  []  vegetarian  []  low salt  []  low-carb   Swallowing: []  impaired Dentures:   []  yes    Depression:  []  yes   Cognition:     Incontinence: [] urine  []  fecal  []  urine and fecal      ADVANCED CARE PLANNING     Advanced directives on file: []  No  []   Yes ? Completed:     Medical DPOA on file:   []   Yes ?   []   No ? Patient designates ** * to be their surrogate medical decision-maker.         HEALTH CARE MAINTENANCE     IMMUNIZATIONS:   Immunization History   Administered Date(s) Administered   ? COVID-19, mRNA, (Pfizer - Purple Cap) 30 mcg/0.3 mL 03/11/2019, 04/01/2019, 10/29/2019   ? COVID-19, mRNA, tris-sucrose (Pfizer - [employer]) 30 mcg/0.3 mL 06/20/2020   ? DTaP 12/31/2015   ? influenza vaccine IM quadrivalent (Fluzone Quad) MDV (52 months of age and older) 11/26/2016   ? influenza vaccine IM quadrivalent adjuvanted (FluAD Quad) (PF) SYR (42 years of age and older) 11/05/2018, 12/01/2019   ? influenza, unspecified formulation 04/01/2019, 04/01/2019, 12/01/2019, 12/01/2019, 12/01/2019, 12/01/2019   ? pneumococcal polysaccharide vaccine 23-valent (Pneumovax) 01/10/2019       Mammogram:  DXA:  PAP:  Colonoscopy:      PHYSICAL EXAM     BP 120/70 (BP Location: Right arm, Patient Position: Sitting, Cuff Size: Regular)  ~ Pulse 99  ~ Temp 36.1 ?C (97 ?F) (Tympanic)  ~ Resp 19  ~ Wt 146 lb 12.8 oz (66.6 kg)  ~ SpO2 95%  ~ BMI 24.43 kg/m?   Wt Readings from Last 3 Encounters:   11/03/20 146 lb 12.8 oz (66.6 kg)   10/28/20 148 lb 9.6 oz (67.4 kg)   09/22/20 150 lb 1.6 oz (68.1 kg)         System Check if normal Positive or additional negative findings   GEN  [x]  NAD     Eyes  []  Conj/Lids []  Pupils  []  Fundi   []  Sclerae []  EOM  ENT  []  External ears   []  Otoscopy   []  Gross Hearing    []   External nose   []  Nasal mucosa   []  Lips/teeth/gums    []  Oropharynx    []  Mucus membranes      Neck  [x]  Inspection/palpation    [x]  Thyroid     Resp  [x]  Effort    [x]  Auscultation       CV  []  Rhythm/rate   [x]  Murmurs   [x]  Edema   []  JVP non-elevated    Normal pulses:   []  Radial []  Femoral []  Pedal     Breast  []  Inspection []  Palpation     GI  []  Bowel sounds    [x]  Nontender   [x]  No distension    []  No rebound or guarding   []  No masses   []  Liver/spleen    []  Rectal     GU  F:  []  External []  vaginal wall         []  Cervix     []  mucus        []  Uterus    []  Adnexa   M:  []  Scrotum []  Penis         []  Prostate     Lymph  [x]  Cervical []  supraclavicular     [x]  Axillae   []  Groin/inguinal     MSK []  Gait  []  Back     Specify site examined:    []  Inspect/palp []  ROM   []  Stability []  Strength/tone []  Used arms to push up from seated to standing position   Assistive device:  []  single point cane []  quad cane  []  FWW []  rollator walker      Skin  []  Inspection []  Palpation     Neuro  []  Alert and oriented     []  CN2-12 intact grossly   []  DTR      []  Muscle strength      []  Sensation   []  Pronator drift   []  Finger to Nose/Heel to Shin   []  Romberg     Psych  []  Insight/judgement     []  Mood/affect    []  Gross cognition            LABS/STUDIES     LABS:  Lab Results   Component Value Date    WBC 6.7 10/28/2020    WBC 5.3 09/13/2020    WBC 6.10 03/23/2020    HGB 12.0 10/28/2020    HGB 11.4 09/13/2020    HGB 12.7 03/23/2020    MCV 99.7 (H) 10/28/2020    PLT 384 (H) 10/28/2020    PLT 300 09/13/2020    PLT 418 (H) 03/23/2020     Lab Results   Component Value Date    NA 136 10/28/2020    NA 135 (L) 09/13/2020    NA 136 06/09/2020    K 4.81 10/28/2020    K 4.09 09/13/2020    K 4.0 06/09/2020    CREAT 1.7 (H) 10/28/2020    CREAT 1.8 (H) 09/13/2020    CREAT 1.63 (H) 06/09/2020    GFRESTNOAA 29 06/09/2020    GFRESTNOAA 28 05/18/2020    GFRESTNOAA 30 03/23/2020    GFRESTAA 34 06/09/2020    GFRESTAA 33 05/18/2020    GFRESTAA 35 03/23/2020    CALCIUM 10.2 10/28/2020    CALCIUM 9.3 09/13/2020    CALCIUM 9.4 06/09/2020     Lab Results   Component  Value Date    ALT 7 10/28/2020    ALT 12 09/13/2020    AST 20 10/28/2020    AST 21 09/13/2020    ALKPHOS 67 10/28/2020    BILITOT 0.30 10/28/2020    ALBUMIN 4.0 10/28/2020 ALBUMIN 3.3 09/13/2020     Lab Results   Component Value Date    TSH 2.8 03/23/2020    TSH 1.00 12/17/2018     Lab Results   Component Value Date    HGBA1C 5.2 03/23/2020    HGBA1C 4.9 09/22/2019     No results found for: CHOL, CHOLDLCAL  No results found for: CHOLDLQ  No results found for: [facility]  Lab Results   Component Value Date    FE 87 07/14/2019    FERRITIN 57 07/14/2019    FOLATE 9.6 11/25/2019    TIBC 356 07/14/2019     Lab Results   Component Value Date    VITAMINB12 3,994 (H) 11/25/2019    VITAMINB12 217 (L) 09/22/2019     Lab Results   Component Value Date    BNP 92 09/22/2019     No results found for: PSATOTAL      STUDIES:    EKG  This data element was independently reviewed and interpreted by me     NSR NO ST or T changes    XR CHEST PA LAT 2V  September 22, 2019?  COMPARISON: KUB December 17, 2018  ?  History: sob  ?  FINDINGS:  ?  Lungs: Clear  Heart/aorta: Normal heart size. The thoracic aorta is mildly calcified, tortuous and ectatic.  Adenopathy: None  Pleura: No effusion  Bones and Chest wall: No acute bony or body wall  findings. Osteopenia and bony maturational changes. Right upper quadrant cholecystectomy clips stable from October 2020  ?  ?  IMPRESSION:  ?  No acute findings or abnormalities related to provided history.    ASSESSMENT and PLAN     [name] is a 85 y.o. female who presents today for *follow up**.          #HTN--Increased nifedipine CR 60 mg daily. Continue losartan 25 mg.  BP Better.    #CKD--stage 4--chronic--CTM--control BP as tolerated by symptoms of orthostasis and fatigue    #Aortic Calcification--noted on chest imaging     September 22, 2019?       --Not Worsening.  Chronic.  Will monitor and treat underlying risk factors with medical and lifestyle interventions as warranted by balance between benefits and burdens.  #allergic rhinitis--trial flonase  #Macrocytosis--  #Deconditioning--initiate home PT  #GERD--s/p Nisan fundoplication  #COPD--mixed--per PFT's from [name].--consider reinitiation of inhalers.  #s/p rectocele repair  #s/p cholecystectomy  #Immunosupressed status--medication related.  Chronic. CTM  #Nocturnal hypoxemia--presumably related to MG crisis but has history of COPD. ?Will order nocturnal home O2 study and consider PFT's/Pulmonary evaluation once settled down. ?May need to restart BREO inhaler.  #Myasthenia Gravis--s/p plasmapheresis x5 October 2020, no Thymoma, on Azathioprine 150 daily and mestinon 60 tid.??Saw?Dr. Enedina Finner at [facility]. ?Was considering Soliris.??Ordering IVIG and MRI.  Encourage ongoing neurology follow up.   #IBS--titrate miralax to comfort. ?Consider duloxetine substitute for buproprion.  #Interstitial cystitis--on daily Trimethoprim 100 for UTI prophylaxis. ?Using bladder instillations prn. OFF topical estrogen due to advice from neurologist re: MG.  To see Dr. Lorenz Coaster.  Suspect recent flare related to breast CA re-diagnosis.  Doubt pseudomonas bactiuria is pathogen at present.  WIll complete current meropenum.  #Major Depression--active but appears better s/p  increase Duloxetine to 40 mg daily. Referred patient to Dr. Randie Heinz previously.  #Allergic rhinitis--previously on nasal steroids.  #Breast CA--Now with bx+.  To see [place] surgery Friday. ?Patient declined surgery in 2019 and experienced dry mouth from Tamoxifen. ?Was considering Anastrozole.Per records from [facility]:   ''07/20/2017 Screening detected right breast calcifications and distortion. The distortion was in the upper outer quadrant: Biopsy DCIS intermediate grade with CSL; calcifications UIQ: 2.8 cm: Biopsy DCIS+ ALH +CSL; 4 cm apart, axilla negative, Tis NX stage 0''  Await MRI to clarify surgical options.  #DM with CKD--stabe 3b--based on labs from [name]. ?FOllow here. ?If BP permits, may consider ARB BUT patient with h/o angioedema so will ONLY initiate this agent with great caution.  Refer to Ophthalmology.  #OA knees--previously getting hyaluronic acid injections.  #Anxiety  #Benzodiazepine dependence--Weaning to off.  #Fibromyalgia--duloxetine as above.   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  #Statin intolerance      FOLLOW-UP     RTC     Future Appointments   Date Time Provider Department Center   11/03/2020  2:00 PM York Ram., MD GERI IMS 420 MEDICINE   11/18/2020  1:00 PM MP2 MR01 3T (HOSP) MRI MP2 [facility]   11/25/2020  1:20 PM Fredda Hammed., MD HEM/ONC 230 MEDICINE   12/13/2020 11:00 AM [facility]U [facility] NEUMUSC B200 NEUROLOGY         The above plan of care, diagnosis, orders, and follow-up were discussed with the patient and/or surrogate. Questions related to this recommended plan of care were answered.    ANALYSIS OF DATA (Needs to meet 1 category for moderate and 2 categories for high LOS)     I have:     Category 1 (Needs 3 for moderate and high LOS)     []  Reviewed []  1 []  2 []  ? 3 unique laboratory, radiology, and/or diagnostic tests noted below    Test/Study:  on date .    []  Reviewed []  1 []  2 []  ? 3 prior external notes and incorporated into patient assessment    I reviewed Dr. 's note in specialty  from date .    []  Discussed management or test interpretation with external provider(s) as noted      []  Ordered []  1 []  2 []  ? 3 unique laboratory, radiology, and/or diagnostic tests noted in A&P    []  Obtained history from independent historian:       Category 2  []  Independently interpreted the test    Category 3  []  Discussed management or test interpretation with external provider(s) as noted      INTERACTION COMPLEXITY and SOCIAL DETERMINANTS of HEALTH       PROBLEM COMPLEXITY   []  New problem with uncertain diagnosis or prognosis (moderate)   []  Multiple stable chronic problems (moderate)   []  Chronic problem not stable - not controlled, symptomatic, or worsening (moderate)   []  Severe exacerbation of chronic problem (high)   []  New or chronic problem that poses threat to life or bodily function (high)     MANAGEMENT COMPLEXITY   []  Old or external/outside records reviewed   []  Discussion with alternate (proxy) if patient with impaired communication / comprehension ability (e.g., dementia, aphasia, severe hearing loss).   []  Repeated questions (or disagreement) between patient and/among caregivers/family during the visit.  []  Caregiver/patient emotions/behavior/beliefs interfering with implementation of treatment plan.  []  Independent interpretation of test (EKG, Chest XRay)   []  Discussion  of case with a consultant physician     RISK LEVEL   []  Prescription drug management (moderate)   []  Minor surgery with CV risk factors or elective major surgery (moderate)   []  Dx or Rx significantly limited by SDoH (inadequate housing, living alone, poor health care access, inappropriate diet; low literacy) (moderate)   []  Major surgery - elective with CV risk factors or emergent (high)   []  Need for hospitalization (high)   []  New DNR or de-escalation of care (high)      SDoH  The diagnosis or treatment of said conditions is significantly limited by the following social determinants of health:  []  Z59.0 Homelessness  []  Z59.1 Inadequate housing  []  Z59.2 Discord with neighbors, lodgers and landlord  []  Z60.2 Problems related to living alone  []  Z59.8 Other problems related to housing and economic circumstances  []  Z59.4 Lack of adequate food and safe drinking water  []  Z59.6 Low income  []  Z59.7 Insufficient social insurance and welfare support  []  Z59.9 Problems related to housing and economic circumstances, unspecified  []  Z75.3 Unavailability and inaccessibility of health care facilities  []  Z75.4 Unavailability and inaccessibility of other helping agencies  []  Z72.4 Inappropriate diet and eating habits  []  Z62.820 Parent-biological child conflict  []  Z63.8 Other specified problems related to primary support group  []  Z55.0 Illiteracy and low level literacy   []  Z56.9 Unspecified problems related to employment        If Billing Based on Time:     I performed the following items on the day of service:    [x]  Preparing to see the patient (e.g., review of tests)  [x]  Obtaining and/or reviewing separately obtained history   [x]  Performing a medically appropriate examination and/or evaluation   [x]  Counseling and educating the patient/family/caregiver   [x]  Ordering medications, tests, or procedures  [x]  Referring and communicating with other healthcare professionals (when not separately reported)  [x]  Documenting clinical information in the EHR  []  Independently interpreting results and communicating results to patient/family/caregiver    I spent the following total amount of time on these tasks on the day of service:  New Patient     Established Patient  []  15-29 minutes - 99202    []  up to 9 minutes - 99211  []  30-44 minutes - 99203     []  10-19 minutes - 99212   []  45-59 minutes - 99204      []  20-29 minutes - 99213   []  60-74 minutes - 99205   []  30-39 minutes - 99214         [x]  40-55 minutes - 99215    []  I spent an additional ** * 15-minute-increment(s) for a total of * ** minutes on these tasks on the day of service. [phone] for each additional 15 minutes.)    Author: York Ram, MD 11/03/2020

## 2020-11-03 ENCOUNTER — Ambulatory Visit: Payer: BLUE CROSS/BLUE SHIELD

## 2020-11-03 ENCOUNTER — Ambulatory Visit: Payer: BLUE CROSS/BLUE SHIELD | Attending: Geriatric Medicine

## 2020-11-03 MED ORDER — DULOXETINE HCL 20 MG PO CPEP
60 mg | ORAL_CAPSULE | Freq: Every day | ORAL | 3 refills | Status: AC
Start: 2020-11-03 — End: ?

## 2020-11-06 MED ORDER — FLUTICASONE FUROATE-VILANTEROL 100-25 MCG/INH IN AEPB
1 | Freq: Every day | RESPIRATORY_TRACT | 3 refills
Start: 2020-11-06 — End: ?

## 2020-11-13 ENCOUNTER — Ambulatory Visit: Payer: BLUE CROSS/BLUE SHIELD

## 2020-11-15 MED ORDER — MECLIZINE HCL 25 MG PO TABS
25 mg | ORAL_TABLET | Freq: Three times a day (TID) | ORAL | 1 refills | Status: AC | PRN
Start: 2020-11-15 — End: ?

## 2020-11-18 ENCOUNTER — Inpatient Hospital Stay: Payer: BLUE CROSS/BLUE SHIELD | Attending: Hematology & Oncology

## 2020-11-18 DIAGNOSIS — C50812 Malignant neoplasm of overlapping sites of left female breast: Secondary | ICD-10-CM

## 2020-11-18 DIAGNOSIS — C50811 Malignant neoplasm of overlapping sites of right female breast: Secondary | ICD-10-CM

## 2020-11-18 DIAGNOSIS — Z170 Estrogen receptor positive status [ER+]: Secondary | ICD-10-CM

## 2020-11-18 MED ADMIN — GADOBUTROL 1 MMOL/ML IV SOLN: 6.6 mL | INTRAVENOUS | @ 22:00:00 | Stop: 2020-11-18 | NDC 50419032511

## 2020-11-21 NOTE — Progress Notes
HEMATOLOGY/ONCOLOGY OUTPATIENT NOTE    PATIENT: [name]  MRN: [id number]  DOB: 06-19-1935  DATE OF SERVICE: 11/25/2020  Referring MD: York Ram., MD    Reason for Consultation/ Chief Complaint:  Bilateral breast cancer    HPI   Marely Apgar is a 85 y.o. female with PMH including myasthenia gravis, asymmetric hearing loss, fibromyalgia, DM with CKD, major depression, interstitial cystitis, IBS, COPD who presents to establish care for breast cancer.     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 07/19/2017 Screening detected right breast calcifications and distortion in the upper outer quadrant: Biopsy DCIS+ ALH +CSL; 4 cm apart, axilla negative.    Neoadjuvant tamoxifen started 10/30/17, stopped after 4-6 weeks due to dry mouth and mouth sores. Switched to anastrozole 01/29/18, discontinued because it made her throat dry. Declined additional antiestrogen therapy.   Moved on October of 2020 to [place]   ?  Ms. Costanzo then noted left breast pain in April 2022.     Underwent a diagnostic bilateral breast imaging 06/13/2020 which revealed:   ?- Right breast: solid mass with indistinct margins measuring 14 x 4 x 10 mm in the right breast at 1 o'clock located 6 centimeters from the nipple with associated microclip,  not parallel solid mass with irregular margins measuring 8 x 4 x 5 mm seen in the right breast at 12 o'clock, upper outer quadrant located 6 centimeters from the nipple.   ?- Left breast: irregular mass with angular margins measuring 10 x 9 x 9 mm seen in the left breast at 11 o'clock located 5 centimeters from the nipple, axillary lymph node in the left axilla with cortical thickness measuring up to 6 mm.    R breast biopsy at 12:00, 6 cm from nipple on 07/13/2020 revealed invasive ductal carcinoma with lobular and focal cribriform features, grade 2 ER (+) PR (+) HER2 (-), Ki-67 5%    L breast biopsy at 11:00, 5 cm from nipple on 07/13/2020 revealed invasive ductal carcinoma with lobular features, grade 2 ER (+) PR (-) HER2 (-), Ki-67 10%  ?  Breast MRI performed on 08/05/2020 that redemonstrated multifocal right breast malignancy and left breast IDC with an additional 4.3cm of NME that spanned nearly the entire left breast that appears suspicious.     [facility] genetic testing (-) for pathogenic variants.     08/17/2020 Patient presents to establish care for breast cancer. She has seen Dr. Janee Morn to discuss surgical treatment, and is quite worried about anesthesia with her myasthenia gravis . She is interested in neoadjuvant therapy     09/13/2020 Patient presents for follow-up of breast cancer. PETCT on 08/22/2020 with no evidence of FDG avid metastatic disease. DXA scan on 08/26/2020 demonstrated osteopenia of the left femoral neck and total hip.     10/28/2020 Patient presents for follow-up of breast cancer. Labs on 09/13/2020 showed WBC 5.3, hgb 11.4, platelets 300, ANC 3.58. Endorses fatigue, frequent diarrhea. BM anytime she eats, usually about 3 times a day. Diarrhea is more frequent in the morning. Also reports hot flashes. Breasts are tender, using a larger bra size.     11/25/2020 Patient presents for follow-up of breast cancer. Labs on 9/2/202 showed WBC 6.7, hgb 12.0, platelets 384,  ANC 4.80. Breast MRI on 11/18/2020 with stable to slightly decreased size of known biopsy proven malignancies in the right breast, decreased size of the irregular mass with microclip at 11:00, 5 cm  from the nipple, consistent with known malignancy, nonmass enhancement spanning 40 mm has also slightly decreased in size and now demonstrates subthreshold kinetics, this remains suspicious, previously described oval enhancing mass at 4:00 is no longer identified.        ECOG performance status: 0  PAST HISTORY   PMH:  Patient Active Problem List    Diagnosis Date Noted   ? Immunosuppressed status (HCC/RAF) 03/21/2020   ? HTN (hypertension) 10/31/2019   ? Aortic calcification (HCC/RAF) 09/25/2019   ? Refractive error 04/28/2019   ? Ptosis of eyelid 04/28/2019   ? Pseudophakia of both eyes 04/28/2019   ? Benzodiazepine dependence (HCC/RAF) 12/20/2018   ? Breast cancer (HCC/RAF) 12/20/2018   ? Spondylosis without myelopathy or radiculopathy, lumbar region 11/28/2018   ? Anxiety 11/27/2018   ? CKD (chronic kidney disease), stage III (HCC/RAF) 11/27/2018   ? Nocturnal hypoxemia 04/07/2018     Last Assessment & Plan:   We discussed potential impact of hypoxemia from her COPD on other body function  Plan-overnight oximetry     ? Major depression, recurrent, chronic (HCC/RAF) 12/16/2017   ? Diabetes mellitus (HCC/RAF) 12/16/2017   ? Osteoarthritis of knee 05/17/2017   ? H/O arthroscopy 03/06/2017   ? Myasthenia gravis with exacerbation (HCC/RAF) 12/18/2016   ? Vaginal atrophy 08/16/2016   ? Acute medial meniscal tear, left, subsequent encounter 07/18/2016   ? Renal cyst 03/12/2016   ? Asthmatic bronchitis, mild persistent, with acute exacerbation 12/29/2015     Last Assessment & Plan:   Continue rescue inhaler as needed. Try adding sample Bevespi maintenance inhaler to see if this helps.  She is currently on prednisone 20 mg daily maintenance and I don't think adding a steroid inhaler component will make any difference.     ? Urethral caruncle 07/26/2015   ? Chronic cholecystitis 01/14/2015   ? Angioedema 05/23/2014     Attributed to injected methylprednisolone    Last Assessment & Plan:   We discussed limited ability to test for possible triggers. We are asking our labs sources about ability to test against methylprednisolone for injection.  A different brand might be worth trying. She can try pre-medicating with an antihistamine.     ? Chronic interstitial cystitis 10/01/2011   ? Insomnia 05/31/2007     Qualifier: Diagnosis of   By: Maple Hudson MD, Clinton D    Last Assessment & Plan:   I'm concerned that some of her sleep problems may reflect obstructive sleep apnea based on her palate and which she can tell me about her sleep patterns without a witness. We also want to know about oxygenation during sleep.  Plan-schedule sleep study  Qualifier: Diagnosis of   By: Maple Hudson MD, Clinton D      Last Assessment & Plan:   I'm concerned that some of her sleep problems may reflect obstructive sleep apnea based on her palate and which she can tell me about her sleep patterns without a witness. We also want to know about oxygenation during sleep.  Plan-schedule sleep study     ? COPD mixed type (HCC/RAF) 04/30/2007     Office Spirometry 03/16/2016-severe obstructive airways disease FVC 1.59/59%, FEV1 0.98/49%, ratio 0.62, FEF 25-75% 0.42/28%  PFT 04/16/16-severe obstructive airways disease with slight response to bronchodilator, severe diffusion defect. FEV1/FVC 0.64, DLCO 45%    Last Assessment & Plan:   Severe obstructive airways disease. She can't hear herself wheeze.  Plan-try samples of Trelegy instead of Anoro for comparison    Overview:  Office Spirometry 03/16/2016-severe obstructive airways disease FVC 1.59/59%, FEV1 0.98/49%, ratio 0.62, FEF 25-75% 0.42/28%  PFT 04/16/16-severe obstructive airways disease with slight response to bronchodilator, severe diffusion defect. FEV1/FVC 0.64, DLCO 45%    Last Assessment & Plan:   Severe obstructive airways disease. She can't hear herself wheeze.  Plan-try samples of Trelegy instead of Anoro for comparison  Office Spirometry 03/16/2016-severe obstructive airways disease FVC 1.59/59%, FEV1 0.98/49%, ratio 0.62, FEF 25-75% 0.42/28%  PFT 04/16/16-severe obstructive airways disease with slight response to bronchodilator, severe diffusion defect. FEV1/FVC 0.64, DLCO 45%    Last Assessment & Plan:   Severe COPD but mainly emphysema with limited bronchospasm component that could be addressed with bronchodilators.  Plan-try sample Bevespi as a maintenance controller, refill pro-air     ? Esophageal reflux 04/30/2007     History of fundoplication     Last Assessment & Plan:   Emphasized continued attention to antireflux measures. Reminded that reflux can be a significant trigger for irritable airways, cough and wheeze.  History of fundoplication       Last Assessment & Plan:   Emphasized continued attention to antireflux measures. Reminded that reflux can be a significant trigger for irritable airways, cough and wheeze.     ? 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 Management is mostly by her ENT physicians.     ? Fibromyalgia 04/30/2007     Qualifier: Diagnosis of   By: Yetta Barre CNA/MA, Rose Fillers: Diagnosis of   By: Yetta Barre CNA/MA, Shanda Bumps     ? Seasonal and perennial allergic rhinitis 04/30/2007     Qualifier: Diagnosis of   By: Yetta Barre CNA/MA, Shanda Bumps     Last Assessment & Plan:   She is now on prednisone 20 mg daily. I suggested she wait until her myasthenia status is stabilized. Then if she needs to she couldn't seek evaluation at one of the allergy practices in town as discussed, since our vaccine program will be closing.  Qualifier: Diagnosis of   By: Yetta Barre CNA/MA, Jessica       Last Assessment & Plan:   She is now on prednisone 20 mg daily. I suggested she wait until her myasthenia status is stabilized. Then if she needs to she couldn't seek evaluation at one of the allergy practices in town as discussed, since our vaccine program will be closing.     Tobacco: smoked  For 30 years     Surgical History:  No past surgical history on file.    Gyn Hx:  Menses 15   G2P2- first birth 41  Menopause :50's   Menopausal status: Postmenopausal  Hormone therapy use: None      Family and Social History  No family history on file.  Social History     Tobacco Use   ? Smoking status: Former Smoker     Quit date: 1985     Years since quitting: 37.7   ? Smokeless tobacco: Former Estate agent Use Topics   ? Alcohol use: Not Currently       MEDS No outpatient medications have been marked as taking for the 11/25/20 encounter (Appointment) with Fredda Hammed., MD.     Allergies   Allergen Reactions   ? Beta Adrenergic Blockers Other (See Comments)     Avoid  d/t Myasthenia Gravis  Avoid d/t Myasthenia Gravis     ? Levofloxacin Other (See Comments)     Flare of Myasthenia Gravis   ? Macrolides And Ketolides Other (See Comments)     Avoid d/t Myasthenia Gravis  Use with caution d/t Myasthenia Gravis  Use with caution d/t Myasthenia Gravis     ? Sulfa Antibiotics Other (See Comments)     May possibly have caused deafness in one ear  May possibly have caused deafness in one ear  other     ? Botulinum Toxins Other (See Comments)     Muscle weakness r/t Myasthenia Gravis  Muscle weakness r/t Myasthenia Gravis     ? Statins Other (See Comments)     Muscle aches  Muscle aches  Muscle aches     ? Plastic Tape Itching and Other (See Comments)     Turns the skin red where applied       PHYSICAL EXAM     There were no vitals filed for this visit.   There is no height or weight on file to calculate BMI.  Wt Readings from Last 3 Encounters:   11/03/20 66.6 kg (146 lb 12.8 oz)   10/28/20 67.4 kg (148 lb 9.6 oz)   09/22/20 68.1 kg (150 lb 1.6 oz)       Review of Systems:  Pertinent items are noted in HPI.      Physical Exam:  General: No acute distress. Appears well-developed, well-nourished and close to stated age.   Head: Normocephalic, atraumatic.  Eyes: Sclera anicteric. EOMI.  ENT: Hearing grossly normal bilaterally. Oropharynx is clear, mucus membranes are moist.  No oral ulcers noted. Good dentition.  Neck: Supple. Trachea midline.   Cardiac: Regular rate and rhythm. Normal S1, S2. No murmurs, rubs, or gallops.  Respiratory: Clear to auscultation bilaterally. No wheezes, rales, or rhonchi noted. Respiratory effort appears normal.   Abdomen: Soft, nontender and nondistended. Bowel sounds are present and normoactive. No organomegaly is appreciated.  Musculoskeletal: No edema. No cyanosis. Extremities are warm and well-perfused.   Extremities: Warm. No edema. No cyanosis.  Neurologic: Gait appears normal. Sensation intact to light touch in all four extremities. Oriented to person, place and time.   Hematologic: No bruising, purpura or petechiae are noted.   Dermatologic: Skin intact.  No rashes appreciated.   Lymphatic: No palpable cervical, supraclavicular, axillary or inguinal adenopathy appreciated.   Psychiatric: Affect appropriate.  Pleasant and conversant.   Breast exam: no breast masses or axillary adenopathy noted     Recent Labs:  Lab Results   Component Value Date    WBC 6.7 10/28/2020    HGB 12.0 10/28/2020    HCT 36.6 10/28/2020    MCV 99.7 (H) 10/28/2020    PLT 384 (H) 10/28/2020      Lab Results   Component Value Date    NA 136 10/28/2020    K 4.81 10/28/2020    CL 100.9 10/28/2020    CO2 28.6 10/28/2020    CREAT 1.7 (H) 10/28/2020    BUN 19.0 10/28/2020     Lab Results   Component Value Date    ALT 7 10/28/2020    AST 20 10/28/2020    ALKPHOS 67 10/28/2020    BILITOT 0.30 10/28/2020     Lab Results   Component Value Date    CALCIUM 10.2 10/28/2020       Pertinent imaging:  11/18/2020 MRI breast bilat  1. Right breast:   *  Stable to slightly decreased size of known biopsy proven malignancies in the right breast. BI-RADS CATEGORY 6 - KNOWN BIOPSY-PROVEN MALIGNANCY. Recommend continued surgical and oncologic management.   ?  2. Left breast:  *  Decreased size of the irregular mass with microclip at 11:00, 5 cm from the nipple, consistent with known malignancy. BI-RADS CATEGORY 6 - KNOWN BIOPSY-PROVEN MALIGNANCY. Recommend continued surgical and oncologic management.   *  Nonmass enhancement spanning 40 mm has also slightly decreased in size and now demonstrates subthreshold kinetics. This remains suspicious. If breast conservation is desired, an MRI guided biopsy is recommended. BI-RADS CATEGORY 4 - SUSPICIOUS.  *  Previously described oval enhancing mass at 4:00 is no longer identified. BI-RADS CATEGORY 2.  ?  OVERALL ASSESSMENT: BI-RADS CATEGORY 4 - SUSPICIOUS.    08/26/2020 DXA lumbar spine/hip  -----------------------------------------------------------------   Region ? ? ? ? ? ? ? ? ? BMD ? ?T-score ?Z-score ? Classification   -----------------------------------------------------------------   AP Spine(L1-L4) ? ? ? ? ?1.062 ? ?0.1 ? ? ?2.9 ? ? ? Normal   Femoral Neck (Left) ? ? ?0.723 ? -1.1 ? ? ?1.3 ? ? ? Osteopenia   Total Hip (Left) ? ? ? ? 0.725 ? -1.8 ? ? ?0.4 ? ? ? Osteopenia   -----------------------------------------------------------------   Impression: The BMD for  the AP Spine(L1-L4) decreased, changing by -2.4% since the last DXA exam.     08/22/2020 PETCT  1.  Mild to moderate left greater than right FDG activity associated with small soft tissue densities in the bilateral breasts in the regions marked by breast biopsy clips.   2.  No evidence of FDG avid metastatic disease.    08/05/2020 MRI breast  1. Right breast:   a) Linear nonmass enhancement measuring 13 mm at 11:00, 5 cm deep to the nipple corresponds to the suspicious architectural distortion seen mammographically. This likely represents the same process as the known malignancy marked by the X microclip from   outside biopsy, which lies 13 mm anterior to this enhancement. Surgical and oncologic management is recommended. BI-RADS CATEGORY 6.  b) Irregular mass containing a microclip measuring 10 mm at 12:00, 6 cm deep to the nipple is a known malignancy. Surgical and oncologic management is recommended. BI-RADS CATEGORY 6.  c) Focal nonmass enhancement containing a microclip from a biopsy performed outside facility measuring 16 mm at 9:00, 2 cm deep to the nipple is a known malignancy. Surgical and oncologic management is recommended. BI-RADS CATEGORY 6.  ?  2. Left breast:   a) Irregular mass containing a microclip measuring 15 mm at 11:00, 5 cm deep to the nipple is a known malignancy. Surgical and oncologic management is recommended. BI-RADS CATEGORY 6.  b) Nonmass enhancement measuring 43 mm throughout the majority of the left breast is suspicious. If breast conservation is a consideration recommend MRI guided biopsy of the anterior-inferior aspect of the nonmass enhancement. BI-RADS CATEGORY 4  c) Oval enhancing mass measuring 5 mm with suggestion of a fatty hilum lies at 4:00, 7 cm deep to the nipple is probably benign and likely represents an intramammary lymph node. Recommend second look ultrasound if there is no sonographic correlate then   short-term MRI follow-up in 6 months is recommended. BI-RADS CATEGORY 3.  d) No MRI correlate for prominent axillary lymph node seen on prior ultrasound. Management as detailed on prior ultrasound report is recommended. BI-RADS CATEGORY 2.  ?  OVERALL ASSESSMENT: BI-RADS CATEGORY 4     06/13/2020  Mammogram/US bilat breasts, diagnostic  IMPRESSION:  Right breast:  1. Mammographic architectural distortion/sonographic mass at 12:00 6 cm from nipple are suspicious.  Recommend ultrasound-guided biopsy.  2. Two biopsied areas in the right breast are known DCIS and ADH. Appropriate action should be taken.  3. Calcifications in the upper outer quadrant of the right breast at  posterior depth located 5 centimeters from the nipple are suspicious. Await results from biopsy at  site of distortion/12:00.  4. Additional solid masses in the right breast at 12:00 and 2 o'clock are probably benign.  Sonographic follow-up in 6 months is recommended tentatively.    Left breast:  1. Irregular mass in the left breast at 11 o'clock is highly suggestive  of malignancy. An ultrasound guided biopsy is recommended.  2. Vague hypoechoic area in the left breast at 12 o'clock, upper outer  quadrant is probably benign. Sonographic follow-up in 6 months is recommended.  3. Solid mass in the left breast at 1 o'clock is probably benign.  Sonographic follow-up in 6 months is recommended.  4. There is a lymph node in the left axilla with cortical  thickness measuring up to 6 mm. Surgical consultation is recommended.  BI-RADS Category 5:  Highly Suggestive for Malignancy    09/22/2019 XR chest  No acute findings or abnormalities related to provided history.    06/19/2019 US pelvis  Small subserosal uterine fundal fibroid. Otherwise normal postmenopausal pelvic ultrasound.        Pertinent pathology:  08/02/2020 [facility] genetics      07/13/2020 Tissue exam  A. BREAST, RIGHT, MASS, 12:00, 6 CM FROM NIPPLE (NEEDLE CORE BIOPSY):  - Invasive ductal carcinoma with lobular and focal cribriform features, grade 2 (50% of biopsy; longest segment 4 mm)             Modified Bloom and Richardson score: 6 of 9                        Tubule formation:                3                        Nuclear pleomorphism:       2                        Mitotic score:                       1  - Ductal carcinoma in situ (DCIS), low nuclear grade, cribriform type with focal central necrosis  - Breast biomarkers: See below  - ERBB2 (HER2) FISH: Negative for HER2 gene amplification   ?  B.  BREAST, LEFT, MASS, 11:00, 5 CM FROM NIPPLE (NEEDLE CORE BIOPSY):  - Invasive ductal carcinoma with lobular features, grade 2 (50% of biopsy; longest involved segment 6 mm)             Modified Bloom and Richardson score: 6 of 9                        Tubule formation:                3                        Nuclear pleomorphism:  2                        Mitotic score:                       1  - Ductal carcinoma in situ (DCIS) is not identified  - Breast biomarkers: See immunohistochemistry report below  - ERBB2 (HER2) FISH: Negative [based on IHC (2+) and FISH, see comment].     COMMENT: It is uncertain whether patients with an average of >4.0 and <6.0 HER2?signals per cell and a HER2/CEP17 ratio of <2.0 benefit from HER2-targeted therapy in the absence of protein overexpression (IHC 3+). If the specimen test result is close to the [facility] ratio threshold for positive, there is a higher likelihood that repeat testing will result in different results by chance alone. Therefore, when [facility] results are not 3+ positive, it is recommended that the sample be considered negative without additional testing on the specimen.    BREAST BIOMARKER REPORT  ?  BLOCK:  A1  FIXATIVE:  Formalin   ?  RESULT ER/PR:  ? ESTROGEN   RECEPTOR PROGESTERONE RECEPTOR   Antibody Clone SP1 Clone 636   %Tumor Staining >95% >95%   Intensity (1+ to 3+) 3+ 3+   ?  RESULT HER-2/neu IHC assay (utilizing FDA-approved DAKO Hercep Test): Arch Pathol Lab Med [phone], ASCO/CAP HER2 Testing in Breast Cancer Update - Veronia Beets al  ?  Test Score: 1+  ?  HER2/neu:  Negative  ?  RESULT Ki-67 (Clone MIB1):  5%  ?  BREAST BIOMARKER REPORT  ?  BLOCK:  B1  FIXATIVE:  Formalin   ?  RESULT ER/PR:  ? ESTROGEN   RECEPTOR PROGESTERONE RECEPTOR   Antibody Clone SP1 Clone 636   %Tumor Staining >95% 0%   Intensity (1+ to 3+) 3+ NA   ?  RESULT HER-2/neu IHC assay (utilizing FDA-approved DAKO Hercep Test): Arch Pathol Lab Med [phone], ASCO/CAP HER2 Testing in Breast Cancer Update - Veronia Beets al  ?  Test Score: 2+  ?  HER2/neu:  Equivocal  ?  RESULT Ki-67 (Clone MIB1):  10%    07/19/2017 Outside pathology        ASSESSMENT & PLAN   Fey Coghill is a 85 y.o. female presents for     1. 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 She elected not to have surgery at that time. She took tamoxifen-->anastrozole for a short period of time, but discontinued endocrine therapy due to side effects.   - R breast biopsy at 12:00, 6 cm from nipple on 07/13/2020 revealed invasive ductal carcinoma with lobular and focal cribriform features, grade 2 ER (+) PR (+) HER2 (-), Ki-67 5%  - L breast biopsy at 11:00, 5 cm from nipple on 07/13/2020 revealed invasive ductal carcinoma with lobular features, grade 2 ER (+) PR (-) HER2 (-), Ki-67 10%  - We had a long discussion about biology and treatment of breast cancer, with emphasis that cure can only be attained with multimodality treatments such as surgery/ +/- radiation and systemic ( endocrine) therapy .  We have discussed that given her concern for anesthesia with Myasthenia Gravis, we cat try neoadjuvant endocrine therapy with close observation of side effects.  - We will start letrozole 2.5 mg po  daily 08/17/2020 --> 10/28/2020 discontinued due to diarrhea  - tumors are not palpable on exam, and she would have to be followed with imaging   -  PETCT on 08/22/2020 with no evidence of FDG avid metastatic disease.   - Arimidex 10/28/2020 --->     11/25/2020   - Breast MRI on 11/18/2020 with stable to slightly decreased size of known biopsy proven malignancies in the right breast, decreased size of the irregular mass with microclip at 11:00, 5 cm from the nipple, consistent with known malignancy, nonmass enhancement spanning 40 mm has also slightly decreased in size and now demonstrates subthreshold kinetics, this remains suspicious, previously described oval enhancing mass at 4:00 is no longer identified.   - Continue Arimidex 1 mg po daily      2.  Ear pain / fullness - in her left ear  -Augmentin 875 bid  - notify Dr. Brayton El, since no exam      3.  Myasthenia gravis   - F/u w Dr. Claudie Leach, Neurology, on 08/17/2020. No contraindication to possible surgery for DCIS. If she is to require surgery, we would likely ensure she has an IVIG infusion within 1 week prior to the procedure. EMG/NCS on 09/19/20 at 2:30 PM. To evaluate numbness and tingling in hands. Discontinue Mestinon (TID PRN; taking once daily recently). Monitor for improvement in hand cramps. Continue Azathioprine 75 mg BID (refilling today). Continue IVIG 60g (1g/kg) every 3 weeks ([place])     4.  Diarrhea  - Imodium 4 mg after each loose stool  - Metamucil       5.  Bone density  - DXA scan on 08/26/2020 demonstrated osteopenia of the left femoral neck and total hip.          DIAGNOSES/ORDERS addressed on 11/25/2020:  1. Breast cancer (HCC/RAF)      - on Arimidex - continue close monitoring for toxicity of intensive drug therapy.    NEW PROBLEMS AS OF 11/25/2020:     []  New minor or self-limited problem (99202/99212).  []  2+ minor or self-limited problem, stable chronic illness, acute uncomplicated illness/injury (99203/99213).  []  Chronic illness with exacerbation/progression/tx side effect; 2+ stable chronic illnesses, new problem with uncertain prognosis, acute illness with systemic symptoms, or acute complicated injury (99204/99214).  []  Chronic illness with severe exacerbation/progression/tx side effect, acute/chronic illness or injury that poses threat to life or bodily function (99205/99215).      REVIEW OF DATA   I have  [x]  Reviewed/ordered []  1 []  2 [x]  ? 3 unique laboratory, radiology, and/or diagnostic tests noted below  No orders of the defined types were placed in this encounter.    10/28/2020 CBC, CMP  11/18/2020 MRI breast    [x]  Reviewed []  1 []  2 [x]  ? 3 prior external notes and incorporated into patient assessment   I reviewed Dr. Velvet Bathe, MD in Surgery, Surgical Oncology on 08/10/2020.  I reviewed Dr. [place] Ram, MD in Medicine, Internal Medicine on 07/13/2020.  I reviewed Dr. Johnney Killian. Desma Maxim, MD in Obstetrics & Gynecology on 05/07/2019.    []  Discussed management or test interpretation with external provider(s) on 11/25/2020 as noted:    RISK OF COMPLICATION   This writer has deemed the above diagnoses to have a risk of complication, morbidity or mortality of:   []  Minimal  []  Low  []  Moderate   []  due to prescription drug management   [] Decision re: minor surgery   [] Diagnosis or treatment limited by social  determinants of health  []  Severe    []  due to intensive monitoring for chemotherapy toxicity   []  Decision elective surgery with risk factors  []  Decision regarding need for hospitalization  []  Advanced Care Planning decisions    SOCIAL DETERMINANTS OF HEALTH   []  The diagnosis or treatment of said conditions is significantly limited by social determinants of health:      I spent 30 minutes counseling and coordinating care for the items checked below on the day of service 11/25/2020  [x]  Preparing to see the patient (e.g., review of tests)  [x]  Obtaining and or reviewing separately obtained history   [x]  Performing a medically appropriate examination and/or evaluation   [x]  Counseling and educating the patient/family/caregiver   [x]  Ordering medications, tests, or procedures  []  Referring and communicating with other healthcare professionals (when not separately reported)  [x]  Documenting clinical information in the EHR  []  Independently interpreting results and communicating results to patient/ family/caregiver     No follow-ups on file.       The above recommendation were discussed with the patient. The patient has all questions answered satisfactorily and is in agreement with this recommended plan of care.    Scribe Signature:  I, Salley Scarlet, have scribed for [employer]. Oscar La, MD on this encounter with [name] at the [name] in [place] on 11/25/2020 at 3:41 PM 11/25/2020.     Physician Signature:  I have reviewed this note, scribed by Salley Scarlet, I have edited and attest that it is an accurate representation of my evaluation.   Rhona Leavens. Oscar La, MD 11/25/2020 at 3:41 PM

## 2020-11-22 ENCOUNTER — Ambulatory Visit: Payer: BLUE CROSS/BLUE SHIELD

## 2020-11-23 ENCOUNTER — Telehealth: Payer: BLUE CROSS/BLUE SHIELD

## 2020-11-23 ENCOUNTER — Ambulatory Visit: Payer: BLUE CROSS/BLUE SHIELD

## 2020-11-23 NOTE — Telephone Encounter
Geriatric Social Work Note    Date of Encounter: 11/23/2020  Patient: [name]  MRN: [id number]  DOB: February 23, 1936   PCP: Molinda Bailiff., MD      REASON FOR REFERRAL: LCSW received referral from PCP to follow-up with the patient to provide life alert and care giving resources.       ASSESSMENT/INTERVENTIONS: LCSW contacted the patient via phone [phone]) to address psychosocial needs. The patient was alert and oriented x 4, and was in a pleasant mood. Patient confirmed reason for referral. Patient acknowledged receiving one care giving resource provided by PCP, and reported being agreeable to more resources.     LCSW explained life alert systems and provided detailed information in regards to Life Alert Emergency Response system via phone. LCSW provided these details and additional care giving resources via MyChart per patient's request. Patient was appreciative of call and denied having further needs.      PLAN: No further concerns/needs identified at this time. Patient was encouraged to reach out to this LCSW if needed. Will remain available to address psychosocial needs as they arise.          , LCSW, ACM, CCATP  Clinical Social Worker III  Department of Medicine, Division of Geriatrics  [address], [address]  [place], [place] [address]    Office: [phone]  Work Cell: [phone]  Pager: [phone]

## 2020-11-23 NOTE — Telephone Encounter
Hi [name].  Might you call this delightful human being to offer thoughts re: caregiver agencies and alert bracelets?  (If you ask her nicely to teach you to talk Story,'' she'll give you lessons.  She literally laughs at me as I try out new expressions.)

## 2020-11-25 ENCOUNTER — Ambulatory Visit: Payer: BLUE CROSS/BLUE SHIELD | Attending: Hematology & Oncology

## 2020-11-25 MED ORDER — AMOXICILLIN-POT CLAVULANATE 875-125 MG PO TABS
1 | ORAL_TABLET | Freq: Two times a day (BID) | ORAL | 0 refills | Status: AC
Start: 2020-11-25 — End: ?

## 2020-11-30 ENCOUNTER — Ambulatory Visit: Payer: BLUE CROSS/BLUE SHIELD

## 2020-12-13 ENCOUNTER — Non-Acute Institutional Stay: Payer: BLUE CROSS/BLUE SHIELD

## 2020-12-18 ENCOUNTER — Non-Acute Institutional Stay: Payer: BLUE CROSS/BLUE SHIELD

## 2020-12-18 ENCOUNTER — Ambulatory Visit: Payer: BLUE CROSS/BLUE SHIELD

## 2020-12-18 DIAGNOSIS — R079 Chest pain, unspecified: Secondary | ICD-10-CM

## 2020-12-18 DIAGNOSIS — J449 Chronic obstructive pulmonary disease, unspecified: Secondary | ICD-10-CM

## 2020-12-18 DIAGNOSIS — M797 Fibromyalgia: Secondary | ICD-10-CM

## 2020-12-18 DIAGNOSIS — G7001 Myasthenia gravis with (acute) exacerbation: Secondary | ICD-10-CM

## 2020-12-18 DIAGNOSIS — F339 Major depressive disorder, recurrent, unspecified: Secondary | ICD-10-CM

## 2020-12-18 DIAGNOSIS — E1122 Type 2 diabetes mellitus with diabetic chronic kidney disease: Secondary | ICD-10-CM

## 2020-12-18 DIAGNOSIS — N1832 Type 2 diabetes mellitus with stage 3b chronic kidney disease, without long-term current use of insulin (HCC/RAF): Secondary | ICD-10-CM

## 2020-12-18 DIAGNOSIS — C50811 Malignant neoplasm of overlapping sites of right female breast: Secondary | ICD-10-CM

## 2020-12-18 DIAGNOSIS — N3010 Interstitial cystitis (chronic) without hematuria: Secondary | ICD-10-CM

## 2020-12-18 DIAGNOSIS — C50812 Malignant neoplasm of overlapping sites of left female breast: Secondary | ICD-10-CM

## 2020-12-18 DIAGNOSIS — Z170 Estrogen receptor positive status [ER+]: Secondary | ICD-10-CM

## 2020-12-18 DIAGNOSIS — I10 Essential (primary) hypertension: Secondary | ICD-10-CM

## 2020-12-19 ENCOUNTER — Inpatient Hospital Stay
Admit: 2020-12-19 | Discharge: 2020-12-20 | Disposition: A | Discharge status: Home / Self Care | Payer: BLUE CROSS/BLUE SHIELD | Source: Home / Self Care

## 2020-12-19 ENCOUNTER — Ambulatory Visit: Payer: BLUE CROSS/BLUE SHIELD

## 2020-12-19 DIAGNOSIS — K21 Gastroesophageal reflux disease with esophagitis, unspecified whether hemorrhage: Secondary | ICD-10-CM

## 2020-12-19 LAB — Amylase: AMYLASE: 21 U/L — ABNORMAL LOW (ref 31–124)

## 2020-12-19 LAB — Magnesium: MAGNESIUM: 1.7 meq/L (ref 1.4–1.9)

## 2020-12-19 LAB — Blood Culture Detection
BLOOD CULTURE FINAL STATUS: NEGATIVE
BLOOD CULTURE FINAL STATUS: NEGATIVE
BLOOD CULTURE FINAL STATUS: NEGATIVE
BLOOD CULTURE FINAL STATUS: NEGATIVE

## 2020-12-19 LAB — Extra Light Green Top

## 2020-12-19 LAB — Hepatic Funct Panel: BILIRUBIN,TOTAL: 0.4 mg/dL (ref 0.1–1.2)

## 2020-12-19 LAB — CBC
NUCLEATED RBC%, AUTOMATED: 0 (ref 11.1–15.5)
PLATELET COUNT, AUTO: 332 10*3/uL (ref 143–398)

## 2020-12-19 LAB — Basic Metabolic Panel
CALCIUM: 9.6 mg/dL (ref 8.6–10.4)
GLUCOSE: 122 mg/dL — ABNORMAL HIGH (ref 65–99)
GLUCOSE: 141 mg/dL — ABNORMAL HIGH (ref 65–99)
UREA NITROGEN: 19 mg/dL (ref 7–22)

## 2020-12-19 LAB — Troponin I
TROPONIN I: <0.04 ng/mL (ref <0.04)
TROPONIN I: 0.06 ng/mL — ABNORMAL HIGH (ref <0.04)
TROPONIN I: 0.07 ng/mL — ABNORMAL HIGH (ref <0.04)
TROPONIN I: 0.05 ng/mL — ABNORMAL HIGH (ref <0.04)

## 2020-12-19 LAB — Expedited COVID-19 and Influenza A B PCR: INFLUENZA A PCR: NOT DETECTED

## 2020-12-19 LAB — Differential Automated
ABSOLUTE IMMATURE GRAN COUNT: 0.03 10*3/uL (ref 0.00–0.04)
EOSINOPHIL PERCENT, AUTO: 0 (ref 0.00–0.04)

## 2020-12-19 LAB — Prothrombin Time Panel: PROTHROMBIN TIME: 12.9 s (ref 11.5–14.4)

## 2020-12-19 LAB — Lipase: LIPASE: 23 U/L (ref 13–69)

## 2020-12-19 MED ADMIN — MAGNESIUM SULFATE 2 GM/50ML IV SOLN: 2 g | INTRAVENOUS | @ 15:00:00 | Stop: 2020-12-19 | NDC 25021061281

## 2020-12-19 MED ADMIN — IODIXANOL 320 MG/ML IV SOLN: 100 mL | INTRAVENOUS | @ 02:00:00 | Stop: 2020-12-19 | NDC 65219038310

## 2020-12-19 MED ADMIN — ASPIRIN 325 MG PO TABS: 325 mg | ORAL | @ 04:00:00 | Stop: 2020-12-19 | NDC 00536105429

## 2020-12-19 MED ADMIN — BUPROPION HCL ER (SR) 100 MG PO TB12: 200 mg | ORAL | @ 07:00:00 | Stop: 2020-12-21 | NDC 68084069711

## 2020-12-19 MED ADMIN — LIDOCAINE VISCOUS HCL 2 % MT SOLN: 5 mL | ORAL | @ 07:00:00 | Stop: 2020-12-19 | NDC 50383077515

## 2020-12-19 MED ADMIN — ALBUTEROL SULFATE HFA 108 (90 BASE) MCG/ACT IN AERS: 2 | RESPIRATORY_TRACT | @ 08:00:00 | Stop: 2020-12-19 | NDC 00173068224

## 2020-12-19 MED ADMIN — ONDANSETRON HCL 4 MG/2ML IJ SOLN: 4 mg | INTRAVENOUS | @ 02:00:00 | Stop: 2020-12-19 | NDC 60505613000

## 2020-12-19 MED ADMIN — CALCIUM CARBONATE ANTACID 500 (200 CA) MG PO CHEW: 1000 mg | ORAL | @ 10:00:00 | Stop: 2020-12-19 | NDC 68084098833

## 2020-12-19 MED ADMIN — FLUTICASONE-SALMETEROL 100-50 MCG/ACT IN AEPB: 1 | RESPIRATORY_TRACT | @ 15:00:00 | Stop: 2020-12-21 | NDC 00054032656

## 2020-12-19 MED ADMIN — AZATHIOPRINE 50 MG PO TABS: 75 mg | ORAL | @ 19:00:00 | Stop: 2020-12-21 | NDC 68084022901

## 2020-12-19 MED ADMIN — ACETAMINOPHEN 500 MG PO TABS: 1000 mg | ORAL | @ 10:00:00 | Stop: 2020-12-19 | NDC 00904673061

## 2020-12-19 MED ADMIN — LOSARTAN POTASSIUM 25 MG PO TABS: 25 mg | ORAL | @ 15:00:00 | Stop: 2020-12-21 | NDC 68084034611

## 2020-12-19 MED ADMIN — ALBUTEROL SULFATE HFA 108 (90 BASE) MCG/ACT IN AERS: 2 | RESPIRATORY_TRACT | @ 16:00:00 | Stop: 2020-12-19 | NDC 00173068224

## 2020-12-19 MED ADMIN — ALUM & MAG HYDROXIDE-SIMETH 400-400-40 MG/5ML PO SUSP: 30 mL | ORAL | @ 03:00:00 | Stop: 2020-12-19 | NDC 00121176230

## 2020-12-19 MED ADMIN — PANTOPRAZOLE SODIUM 40 MG PO TBEC: 40 mg | ORAL | @ 15:00:00 | Stop: 2020-12-21 | NDC 50268063911

## 2020-12-19 MED ADMIN — METHENAMINE HIPPURATE 1 G PO TABS: 1 g | ORAL | @ 15:00:00 | Stop: 2020-12-21 | NDC 50268054911

## 2020-12-19 MED ADMIN — LIDOCAINE VISCOUS HCL 2 % MT SOLN: 10 mL | ORAL | @ 03:00:00 | Stop: 2020-12-19 | NDC 50383077515

## 2020-12-19 MED ADMIN — ASPIRIN 325 MG PO TABS: 325 mg | ORAL | @ 04:00:00 | Stop: 2020-12-19

## 2020-12-19 MED ADMIN — KETOROLAC TROMETHAMINE 30 MG/ML IJ SOLN: 15 mg | INTRAVENOUS | @ 03:00:00 | Stop: 2020-12-19 | NDC 63323016200

## 2020-12-19 MED ADMIN — BUPROPION HCL ER (SR) 100 MG PO TB12: 200 mg | ORAL | @ 15:00:00 | Stop: 2020-12-21 | NDC 68084069711

## 2020-12-19 MED ADMIN — LACTATED RINGERS IV BOLUS: 1000 mL | INTRAVENOUS | @ 06:00:00 | Stop: 2020-12-19 | NDC 00338011704

## 2020-12-19 MED ADMIN — METHENAMINE HIPPURATE 1 G PO TABS: 1 g | ORAL | @ 07:00:00 | Stop: 2020-12-21 | NDC 50268054911

## 2020-12-19 MED ADMIN — ANASTROZOLE 1 MG PO TABS: 1 mg | ORAL | @ 15:00:00 | Stop: 2020-12-21 | NDC 60687011211

## 2020-12-19 MED ADMIN — FLUTICASONE-SALMETEROL 100-50 MCG/ACT IN AEPB: 1 | RESPIRATORY_TRACT | @ 07:00:00 | Stop: 2020-12-21 | NDC 00054032656

## 2020-12-19 MED ADMIN — MORPHINE SULFATE (PF) 2 MG/ML IV SOLN: 2 mg | INTRAVENOUS | @ 10:00:00 | Stop: 2020-12-19 | NDC 00409189001

## 2020-12-19 MED ADMIN — AZATHIOPRINE 50 MG PO TABS: 75 mg | ORAL | @ 07:00:00 | Stop: 2020-12-21 | NDC 68084022911

## 2020-12-19 MED ADMIN — LIDOCAINE VISCOUS HCL 2 % MT SOLN: 10 mL | ORAL | @ 10:00:00 | Stop: 2020-12-19 | NDC 50383077515

## 2020-12-19 MED ADMIN — ALUM & MAG HYDROXIDE-SIMETH 400-400-40 MG/5ML PO SUSP: 30 mL | ORAL | @ 07:00:00 | Stop: 2020-12-19 | NDC 00121176230

## 2020-12-19 MED ADMIN — SODIUM CHLORIDE 0.9 % IV BOLUS: 250 mL | INTRAVENOUS | @ 02:00:00 | Stop: 2020-12-19 | NDC 00338004902

## 2020-12-19 MED ADMIN — PANTOPRAZOLE SODIUM 40 MG IV SOLR: 40 mg | INTRAVENOUS | @ 04:00:00 | Stop: 2020-12-19 | NDC 00781323295

## 2020-12-19 MED ADMIN — MORPHINE SULFATE 4 MG/ML IV SOLN: 2 mg | INTRAVENOUS | @ 02:00:00 | Stop: 2020-12-19 | NDC 00641612501

## 2020-12-19 MED ADMIN — LACTATED RINGERS IV BOLUS: 500 mL | INTRAVENOUS | @ 22:00:00 | Stop: 2020-12-20 | NDC 00338011704

## 2020-12-19 MED ADMIN — DULOXETINE HCL 30 MG PO CPEP: 60 mg | ORAL | @ 15:00:00 | Stop: 2020-12-21 | NDC 00904704461

## 2020-12-19 NOTE — ED Provider Notes
[facility]  Emergency Department Service Report    [name] 85 y.o. female , presents with Pleurisy      Triage   Arrived on 12/18/2020 at 5:23 PM   Arrived by RA 69 [34]    ED Triage Vitals [12/18/20 1728]   Temp Temp src BP Heart Rate Resp SpO2 O2 Device Pain Score Weight   -- -- (!) 212/106 91 18 100 % Nasal cannula Ten --       Pre hospital care:       Allergies   Allergen Reactions   ? Beta Adrenergic Blockers Other (See Comments)     Avoid d/t Myasthenia Gravis  Avoid d/t Myasthenia Gravis     ? Levofloxacin Other (See Comments)     Flare of Myasthenia Gravis   ? Macrolides And Ketolides Other (See Comments)     Avoid d/t Myasthenia Gravis  Use with caution d/t Myasthenia Gravis  Use with caution d/t Myasthenia Gravis     ? Sulfa Antibiotics Other (See Comments)     May possibly have caused deafness in one ear  May possibly have caused deafness in one ear  other     ? Botulinum Toxins Other (See Comments)     Muscle weakness r/t Myasthenia Gravis  Muscle weakness r/t Myasthenia Gravis     ? Statins Other (See Comments)     Muscle aches  Muscle aches  Muscle aches     ? Plastic Tape Itching and Other (See Comments)     Turns the skin red where applied       History   Patient is a 85 y.o. female BIBA with hx of breast cancer who presents to the ED with complaint of intermittent, worsening chest pain since yesterday described as a sharp tension that radiates to back with no alleviating factors. Pt has been actively vomiting upon arrival. Pt moved recently and admits some heavy lifting.     The history is provided by the patient and the EMS personnel. No language interpreter was used.   Chest Pain  The chest pain began yesterday. Chest pain occurs frequently. The chest pain is unchanged. At its most intense, the chest pain is at 5/10. The chest pain is currently at 5/10. The severity of the chest pain is moderate. The quality of the pain is described as sharp, tightness and stabbing. The pain radiates to the upper back and mid back. Primary symptoms include vomiting. Pertinent negatives for primary symptoms include no fever. She tried nothing for the symptoms.            Past Medical History:   Diagnosis Date   ? Breast cancer (HCC/RAF) 12/20/2018        History reviewed. No pertinent surgical history.     Past Family History   Family history reviewed by me and there is no pertinent past family history related to the patient's current case and/or care.       Past Social History   she reports that she quit smoking about 37 years ago. She has quit using smokeless tobacco. She reports previous alcohol use. She reports that she does not use drugs. No history on file for sexual activity.     Review of Systems   Constitutional: Negative for fever.   Cardiovascular: Positive for chest pain.   Gastrointestinal: Positive for vomiting.   All other systems reviewed and are negative.    Physical Exam   Physical Exam  Vitals and  nursing note reviewed.   Constitutional:       Appearance: Normal appearance.   HENT:      Head: Normocephalic and atraumatic.   Eyes:      Conjunctiva/sclera: Conjunctivae normal.      Pupils: Pupils are equal, round, and reactive to light.   Cardiovascular:      Rate and Rhythm: Normal rate and regular rhythm.      Heart sounds: Normal heart sounds.   Pulmonary:      Effort: Pulmonary effort is normal.      Breath sounds: Normal breath sounds.   Abdominal:      Tenderness: There is no abdominal tenderness.   Musculoskeletal:         General: Normal range of motion.      Cervical back: Normal range of motion and neck supple.   Skin:     General: Skin is warm and dry.   Neurological:      Mental Status: She is alert.   Psychiatric:         Behavior: Behavior normal.         ED Course          Laboratory Results   Labs Reviewed - No data to display    Imaging Results     No orders to display       Administered Medications     Medication Administration from 12/18/2020 1724 to 12/18/2020 1735     None          Procedures   Procedural Sedation  ED ECG interpretation    Date/Time: 12/18/2020 5:54 PM  Performed by: Tera Mater., MD  Authorized by: Tera Mater., MD     ECG reviewed by ED Physician in the absence of a cardiologist: yes    Interpretation:     Interpretation: non-specific    Rate:     ECG rate:  93    ECG rate assessment: normal    Rhythm:     Rhythm: sinus rhythm    Ectopy:     Ectopy: none    QRS:     QRS axis:  Normal    QRS conduction: LBBB    ST segments:     ST segments:  Normal  T waves:     T waves: normal    Comments:      No acute ST or T wave changes.      Rhythm Strip Interpretation  12/18/2020  5:54 PM  Normal sinus rhythm rate of 91 bpm.    MDM  Data Reviewed/Counseling: I have reviewed the patient's vital signs, nursing notes and old medical records. I had a detailed discussion with the patient regarding the historical points, exam findings, and any diagnostic results supporting the admit diagnosis. I also discussed lab results, radiology results and the need for further workup and treatment in the hospital.    Consults: X    Clinical Impression   No diagnosis found.    Prescriptions     New Prescriptions    No medications on file       Disposition and Follow-up   Disposition: Refresh note to pull in Disposition    Future Appointments   Date Time Provider Department Center   12/21/2020  3:15 PM Velvet Bathe., MD Surg [facility]   01/24/2021  4:40 PM [place] Ram., MD GERI I[facility] 420 MEDICINE   02/14/2021  1:20 PM Fredda Hammed., MD HEM/ONC 230 MEDICINE  Follow up with:  No follow-up provider specified.    Return precautions are specified on After Visit Summary.    The documentation on this chart was performed by Pete Glatter, scribed for Tera Mater., MD    12/18/2020 5:36 PM     ***

## 2020-12-19 NOTE — Other
Patient's Clinical Goal:   Clinical Goal(s) for the Shift: VSS, monitor EKG/Troponins  Identify possible barriers to advancing the care plan:n/a  Stability of the patient: Moderately Unstable - medium risk of patient condition declining or worsening    Progression of Patient's Clinical Goal: Precautions: High risk for falls  VS: Monitored- Sinus Tach 100-110s. Denies chest pain at this time.   Neuro: AOx4  Pain/Nausea: Denies  Cardiac: Monitored, Sinus Tach 100-110s. Denies chest pain/palpitations at this time.   Gi/GU:  Voids. GI cx  Skin: Intact  Amb: BMAT 3  Lines/Drains: 22G R arm  Diet: Reg    Significant Events: GI cx. EKG/troponins negative    Plan: GI consult. Plan for dc?    Patient remained free from injury/falls this shift, all safety precautions maintained; bed alarm on, call bell within reach. Will continute to monitor.

## 2020-12-19 NOTE — Other
Patient's Clinical Goal:   Clinical Goal(s) for the Shift: VSS, cardiac monitor, pain mgmt  Identify possible barriers to advancing the care plan:   Stability of the patient: Moderately Unstable - medium risk of patient condition declining or worsening    Progression of Patient's Clinical Goal:     VS: VSS   Precautions: Fall risk  Neuro: AXO4  pain: Pt c/o chest pain,one time order for pain given okay per MD   Nausea: Pt denies nausea     Cardiac: Pt c/o chest pain/SOB.  cardiac monitoring. Tachy -MD aware   Resp:  Respirations even and unlabored. Non-monitored- 2L NS   Skin: No skin issues.   GI: Last BM 10/23. Pt uses bathroom   GU: Pt voids in bathroom   LDA:   PIV 22G left forearm: saline locke CDI   BMAT: Level 3 w/ walker   Diet:NPO  Event:  Pt c/o of chest pain at admission. Paged MD ordered, mylanta and lidocaine soln and EKG   Pt continue to c/o of pain MD at bedside; order morphine, antacid, tylenol and lidocaine       Plan:    GI consult for possible endoscopy today   Continue to monitor chest pain/ cardiac monitor  Pain mgmt        Pt. Safety maintained, bed locked in lowest position with call light and belongings in reach, bed alarm on, non skip socks to feet. Hourly rounding implemented. No acute events. All needs met. Will endorse POC to oncoming RN.

## 2020-12-19 NOTE — ED Notes
Patient appears more comfortable now. States that 2nd dose morphine helped alleviate her chest pain. Rounding done, will continue monitoring

## 2020-12-19 NOTE — Progress Notes
SOLID ONCOLOGY SERVICE PROGRESS NOTE     PATIENT:  [name]   DATE OF SERVICE:  12/19/2020  HOSPITAL DAY:  0  CHIEF COMPLAINT:  Pleurisy (Actively vomiting upon arrival, BIBA home, pain progressively worse )  ADMISSION CATEGORY:  Symptom Control    Last 24 Hour/Overnight Events:   - Admitted overnight for atypical chest pain  - EKG with new LBBB without ST changes and mild up-trend troponin, least likely c/f ACS  - Pain improved with GI cocktail however additional pain this am, s/p second GI cocktail  - CTA : No acute process in the chest  - CTAP: no acute abnormality, moderate hiatal hernia w/post surgical changes liekly r/t prior Nissen fundoplication  - No acute events overnight     Subjective/Review of Systems:   - Reports chest pain improved after additional GI cocktail this am  - Notes she has had ongoing n/v, reports eating solely ''potato chips and pretzels'' for a week  - Reports ongoing 2 L oxygen use at night and long standing dry cough at home   - Denies CP, SOB, n/v, c/d or abd pain     Medications:   Scheduled:  anastrozole, 1 mg, Oral, Daily  azaTHIOprine, 75 mg, Oral, BID  buPROPion (SR), 200 mg, Oral, BID  DULoxetine, 60 mg, Oral, Daily  fluticasone-salmeterol, 1 puff, Inhalation, BID  losartan, 25 mg, Oral, Daily  magnesium sulfate IV, 2 g, Intravenous, Once  methenamine hippurate, 1 g, Oral, BID  pantoprazole, 40 mg, Oral, BID  PRN:  albuterol, aluminum-magnesium hydroxide-simethicone, magnesium sulfate IV **OR** magnesium sulfate IV, ondansetron injection/IVPB **OR** ondansetron ODT, potassium chloride **OR** potassium chloride IV (central), potassium chloride IV (central), potassium chloride IV (central)  Infusions:      Physical Exam:   Temp:  [36.6 ?C (97.9 ?F)-37.2 ?C (98.9 ?F)] 36.8 ?C (98.2 ?F)  Heart Rate:  [91-123] 101  Resp:  [13-35] 18  BP: (118-212)/(52-127) 130/60  NBP Mean:  [81-137] 81  SpO2:  [94 %-100 %] 97 %  I/O:  I/O last 2 completed shifts:  In: 250 [IV Piggyback:250]  Out: -   Gen: appears stated age, comfortable, no apparent distress   HEENT: sclerae anicteric, PERRL, dry mucous membranes, oropharynx clear  Neck: trachea midline, JVP nl  CV: tachycardic, regular, normal S1/S2, no murmurs, rubs, gallops, or thrills appreciated  Chest: CTAB, good air movement, no wheezes/rhonchi/rales   Abdomen: soft, non-distended, non-tender, NABS  Extremities: warm, well-perfused; no cyanosis, clubbing, or edema   Neuro: AOx4, appropriately interactive, normal speech, moves all x4 spontaneously    Data:   I have reviewed all of the labs from today. Pertinent labs include:   Lab Results   Component Value Date    WBC 10.43 (H) 12/19/2020    HGB 10.8 (L) 12/19/2020    HCT 32.1 (L) 12/19/2020    MCV 97.3 12/19/2020    PLT 332 12/19/2020     Lab Results   Component Value Date    NA 137 12/19/2020    K 4.2 12/19/2020    CL 102 12/19/2020    CO2 27 12/19/2020    BUN 19 12/19/2020    CREAT 1.12 12/19/2020    GLUCOSE 141 (H) 12/19/2020    CALCIUM 9.3 12/19/2020    MG 1.7 12/19/2020    ALT 11 12/18/2020    AST 28 12/18/2020    ALKPHOS 76 12/18/2020    BILITOT 0.4 12/18/2020    BILICON <0.2 12/18/2020     Micro:  Recent Results (from the past 168 hour(s))   Expedited COVID-19 and Influenza A B PCR, Respiratory Upper    Collection Time: 12/18/20  9:28 PM    Specimen: Nasopharyngeal; Respiratory, Upper   Result Value Ref Range    Specimen Type Respiratory, Upper     COVID-19 PCR/TMA Not Detected Not Detected    Influenza A PCR Not Detected Not Detected    Influenza B PCR Not Detected Not Detected   Bacterial Culture Blood    Collection Time: 12/18/20 11:01 PM    Specimen: Peripheral Vein; Blood   Result Value Ref Range    Blood Culture - Preliminary status Negative To Date  Negative   Bacterial Culture Blood    Collection Time: 12/18/20 11:59 PM    Specimen: Peripheral Vein #2; Blood   Result Value Ref Range    Blood Culture - Preliminary status Negative To Date  Negative     Imaging:  CT abd+pelvis wo contrast    Result Date: 12/19/2020  IMPRESSION: 1.  No acute CT abnormality in the abdomen or pelvis. 2.  Moderate hiatal hernia with postsurgical changes likely related to reported prior Nissen fundoplication. 3.  Left-sided diverticulosis without evidence diverticulitis. 4.  History of bilateral breast cancer without evidence of metastatic disease within the abdomen or pelvis. I, Trish Mage, M.D., have reviewed this radiological study personally and I am in full agreement with the findings of the report presented here. Dictated by: Kirk Ruths   12/19/2020 2:19 AM Signed by: Clemon Chambers Deol   12/19/2020 5:05 AM    CT chest angiogram wo+w contrast    Result Date: 12/18/2020  IMPRESSION: 1.  No acute aortic process. No acute process within the chest. 2.  History of bilateral breast cancer without evidence of intrathoracic metastatic disease. 3.  Additional findings as above. I, Reubin Milan, M.D., have reviewed this radiological study personally and I am in full agreement with the findings of the report presented here. Dictated by: Kirk Ruths   12/18/2020 7:39 PM Signed by: Karolee Ohs Rahsepar   12/18/2020 8:40 PM    Problem-Based Assesment and Plan:   [name] is a 85 y.o. female with bilateral breast cancer, myasthenia gravis, CKD stage 4, fibromyalgia, IBS, and COPD who p/w atypical chest pain x 2-3 days with new LBBB but negative troponin and relief w/ antacid medications most c/f upper GI pathology such as severe GERD, PUD, or gastritis.      # Atypical chest pain, POA - most likely GI etiology given hx of severe GERD, inability to take medications including her Dexilant for the last several days, worse with eating, improved with GI cocktail. ACS least likely per below.  - CTAP: no acute abnormality, moderate hiatal hernia w/post surgical changes liekly r/t prior Nissen fundoplication  - H pylori stool Ag, although long term PPI use  - Continue pantoprazole 40 mg PO bid  - Continue Maalox q4h PRN  - Ondansetron 4 mg IV/PO PRN for nausea  - GI Consulted; appreciate recs      # SIRS, POA: Meets 2/4 criteria w/ tachycardia and leukocytosis. Low concern for infection. CTA and CT AP with no acute process in chest, abd, or pelvis. Bld Cx prelim NGTD.   # Leukocytosis, POA: with neutrophilic predominance.   # Sinus tachycardia, POA: suspect related to hypovolemia and pain.   - WBC downtrend 13->10  - F/U Bld Cx: Prelim NGTD   - Trial bolus today   - Stop albuterol inhaler and transition  to levalbuterol for COPD RT treatments   - CTAP w/o contrast as above  - CBC daily    #Myocardial Ischemia demand, POA: Suspect component due to pain. EKG with new LBBB. Troponin <0.04->0.06->0.07->0.05. 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 Breast DCIS and unable to tolerate tamoxifen and anastrozole. 5/22 Dx with bilateral breast mass, Rt breast ER/PR + Her2 - and Lt. Breast ER+PR-Her2neg. Plan for neoadjuvant endocrine therapy. Initially started on letrazole with inability to tolerate and transitioned to anastrazole  - Continue anastrazole 1mg  PO daily  - Dr Oscar La notified of admission     CHRONIC/STABLE  # Myasthenia gravis, POA: Home azathioprine. Per last neurology note, off pyridostigmine  - Continue azathioprine 75 mg PO bid  # Major depression, POA: Home bupropion and duloxetine   - Continue bupropion SR 200 mg PO bid  - Continue duloxetine 60 mg PO daily  # COPD, POA: Home Breo Ellipta  - Advair while inpatient   - Change albuterol PRN for SOB/wheezing to levalbuterol for HR above   # Interstitial cystitis, POA:  # UTI prevention,POA: Home methenamine hippurate  - Continue methenamine hippurate 1 g PO bid  # HTN, POA: Home losartan   - Continue losartan 25 mg PO daily    Inpatient Checklist  Orders Placed This Encounter      Diet regular    DVT Prophylaxis:  sequential compression devices (SCDs)  GI Prophylaxis:  PPI  Central Lines:  none  Tubes/Drains:  none    Disposition  Continue inpatient management per above     Code Status  DNR (No CPR, defibrillation, intubation),  Primary Emergency Contact: Beutner,[place]    Patient was seen, examined, and plan was formulated/discussed with attending Dr.Erleen Egner, Valetta Fuller., MD    Author:    Nira Retort. Maisie Fus, NP   12/19/2020 7:42 AM      The patient was seen and evaluated by me today with the houseofficer. The chart was reviewed. I agree with the findings and the treatment and evaluation plans as detailed in the NP's progress note.    Rober Minion 12/19/2020 9:13 PM

## 2020-12-19 NOTE — H&P
Solid Oncology History and Physical     PMD: Kaitlyn Ram., MD  DATE OF SERVICE: 12/18/2020  HOSPITAL DAY: 0  CHIEF COMPLAINT: Pleurisy (Actively vomiting upon arrival, BIBA home, pain progressively worse )    History of Present Illness   84y F with bilateral breast cancer, myasthenia gravis, DM2 with CKD stage 4, fibromyalgia, IBS, and COPD who p/w chest pain x 2-3 days.    For the last couple of weeks, hasn't felt well. About 5 days ago, moved [place] to [place] to be near daughter. Two days ago, began having chest pain.    Pain is in center of chest. Feels like a knife when she takes deep breaths. Pain is constant. Blames it on the pollen in the air. Felt like burping would help relieve the pain. Has been burping, but it doesn't really help. Pain is worse with eating. Not worse with exertion. Per daughter, has had very poor PO intake this past week.    Has hx of severe acid reflux and takes Dexilant but has been unable to take it in the last 5 days due to nausea. Had a Nissen fundoplication in the past. Has had similar pain on and off for ''years'' but never this bad.   2-3 weeks ago, every time she looked at food, she'd get nauseous and throw up. This went on for about a week. Then symptoms resolved up until 2 days ago. Hasn't taken any oral medications in 5 days. Denies fever/chills. Denies recent NSAID or alcohol use.    Feels significantly better since receiving GI cocktail in ER.    Oncologic History:  Followed by Dr. 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 07/19/2017 Screening detected right breast calcifications and distortion in the upper outer quadrant: Biopsy DCIS+ ALH +CSL; 4 cm apart, axilla negative.  ?  Neoadjuvant tamoxifen started 10/30/17, stopped after 4-6 weeks due to dry mouth and mouth sores. Switched to anastrozole 01/29/18, discontinued because it made her throat dry. Declined additional antiestrogen therapy.   Moved on October of 2020 to [place]   ?  [name] then noted left breast pain in April 2022.   ?  Underwent a diagnostic bilateral breast imaging 06/13/2020 which revealed:   ?- Right breast: solid mass with indistinct margins measuring 14 x 4 x 10 mm in the right breast at 1 o'clock located 6 centimeters from the nipple with associated microclip,  not parallel solid mass with irregular margins measuring 8 x 4 x 5 mm seen in the right breast at 12 o'clock, upper outer quadrant located 6 centimeters from the nipple.   ?- Left breast: irregular mass with angular margins measuring 10 x 9 x 9 mm seen in the left breast at 11 o'clock located 5 centimeters from the nipple, axillary lymph node in the left axilla with cortical thickness measuring up to 6 mm.  ?  R breast biopsy at 12:00, 6 cm from nipple on 07/13/2020 revealed invasive ductal carcinoma with lobular and focal cribriform features, grade 2 ER (+) PR (+) HER2 (-), Ki-67 5%  ?  L breast biopsy at 11:00, 5 cm from nipple on 07/13/2020 revealed invasive ductal carcinoma with lobular features, grade 2 ER (+) PR (-) HER2 (-), Ki-67 10%  ?  Breast MRI performed on 08/05/2020 that redemonstrated multifocal right breast malignancy and left breast IDC with an additional 4.3cm of NME that spanned nearly the  entire left breast that appears suspicious.   ?  [facility] genetic testing (-) for pathogenic variants.   ?  08/17/2020 Patient presents to establish care for breast cancer. She has seen Dr. Janee Morn to discuss surgical treatment, and is quite worried about anesthesia with her myasthenia gravis . She is interested in neoadjuvant therapy   ?  09/13/2020 Patient presents for follow-up of breast cancer. PETCT on 08/22/2020 with no evidence of FDG avid metastatic disease. DXA scan on 08/26/2020 demonstrated osteopenia of the left femoral neck and total hip.   ?  10/28/2020 Patient presents for follow-up of breast cancer. Labs on 09/13/2020 showed WBC 5.3, hgb 11.4, platelets 300, ANC 3.58. Endorses fatigue, frequent diarrhea. BM anytime she eats, usually about 3 times a day. Diarrhea is more frequent in the morning. Also reports hot flashes. Breasts are tender, using a larger bra size.   ?  11/25/2020 Patient presents for follow-up of breast cancer. Labs on 9/2/202 showed WBC 6.7, hgb 12.0, platelets 384,  ANC 4.80. Breast MRI on 11/18/2020 with stable to slightly decreased size of known biopsy proven malignancies in the right breast, decreased size of the irregular mass with microclip at 11:00, 5 cm from the nipple, consistent with known malignancy, nonmass enhancement spanning 40 mm has also slightly decreased in size and now demonstrates subthreshold kinetics, this remains suspicious, previously described oval enhancing mass at 4:00 is no longer identified.     ED Course     ED Triage Vitals [12/18/20 1728]   Temp Temp src BP Heart Rate Resp SpO2 O2 Device Pain Score Weight   -- -- (!) 212/106 91 18 100 % Nasal cannula Ten --     ECG showed new atypical LBBB but troponin negative. CTA chest w/ contrast negative for aortic dissection or PE. IV morphine x2 and GI cocktail given.     Past Medical History     Past Medical History:   Diagnosis Date   ? Breast cancer (HCC/RAF) 12/20/2018   Has hx of myasthenia gravis and has been hospitalized in the past for plasmapheresis. MG affects her neck and legs. Neck and back have been bothering her the last couple of weeks. Has infusions every other week, last was this past Mon-Tues.  CKD stage 4  Fibromyalgia  IBS  COPD   Diabetes type 2    Past Surgical History   Nissen fundoplication    Family History   Pt was adopted, does not know if she has HF of MI or stroke    Social History     Social History     Socioeconomic History   ? Marital status: Widowed   Tobacco Use   ? Smoking status: Former Smoker     Quit date: 1985     Years since quitting: 37.8   ? Smokeless tobacco: Former Estate agent and Sexual Activity   ? Alcohol use: Not Currently   ? Drug use: Never   Moved to a new house in the [place]  In the process of hiring caregivers    Allergies     Allergies   Allergen Reactions   ? Beta Adrenergic Blockers Other (See Comments)     Avoid d/t Myasthenia Gravis  Avoid d/t Myasthenia Gravis     ? Levofloxacin Other (See Comments)     Flare of Myasthenia Gravis   ? Macrolides And Ketolides Other (See Comments)     Avoid d/t Myasthenia Gravis  Use with caution d/t Myasthenia Gravis  Use with caution d/t Myasthenia Gravis     ? Sulfa Antibiotics Other (See Comments)     May possibly have caused deafness in one ear  May possibly have caused deafness in one ear  other     ? Botulinum Toxins Other (See Comments)     Muscle weakness r/t Myasthenia Gravis  Muscle weakness r/t Myasthenia Gravis     ? Statins Other (See Comments)     Muscle aches  Muscle aches  Muscle aches     ? Plastic Tape Itching and Other (See Comments)     Turns the skin red where applied       Home Medications     Prior to Admission medications    Medication Sig Start Date End Date Taking? Authorizing Provider   Albuterol Sulfate AEPB Takes 1 to 2 puffs every 4 hours as needed for wheezing or SOB Inhale Takes 1 to 2 puffs every 4 hours as needed for wheezing or SOB .Marland Kitchen  Patient not taking: Reported on 11/03/2020. 09/22/20   Kaitlyn Ram., MD   anastrozole 1 mg tablet Take 1 tablet (1 mg total) by mouth daily. 10/28/20   Fredda Hammed., MD   azaTHIOprine 50 mg tablet Take one and one-half tablet (75 mg total) by mouth two (2) times daily. 08/25/20   Odetta Pink., MD, PhD   buPROPion, SR, [facility] SR) 200 mg 12 hr tablet Take 1 tablet (200 mg total) by mouth two (2) times daily. 07/26/20   Kaitlyn Ram., MD   clonazePAM 0.5 mg tablet .25 in am and 0.5 in pm. 09/22/20   Kaitlyn Ram., MD   clonazePAM 1 mg tablet 0.5 mg in am and 1 mg in pm. 05/18/20   Kaitlyn Ram., MD   DEXILANT 60 MG DR capsule Take 60 mg by mouth daily. 05/05/20   [provider] DULoxetine 20 mg DR capsule Take 3 capsules (60 mg total) by mouth daily. 11/03/20   Kaitlyn Ram., MD   fluticasone 50 mcg/act nasal spray Spray 1 spray by nasal route daily. 12/30/19 12/29/20  Kaitlyn Ram., MD   fluticasone-vilanterol (BREO ELLIPTA) 100-25 mcg/inh inhaler Inhale 1 puff daily. 11/06/20   Kaitlyn Ram., MD   GAMMAGARD 30 GM/300ML SOLN Every 2 weeks. 04/16/19   [provider]   GAMUNEX-C 10 GM/100ML infusion  10/12/20   PROVIDER, HISTORICAL   GAMUNEX-C 20 GM/200ML infusion  10/12/20   PROVIDER, HISTORICAL   losartan 25 mg tablet Take 1 tablet (25 mg total) by mouth daily. 01/11/20 01/10/21  Kaitlyn Ram., MD   meclizine 25 mg tablet Take 1 tablet (25 mg total) by mouth three (3) times daily as needed for Dizziness. 11/15/20 11/15/21  Kaitlyn Ram., MD   methenamine hippurate 1 g tablet Take 1 tablet (1 g total) by mouth two (2) times daily. 07/14/20 01/10/21  Docia Chuck., MD   pyridostigmine 60 mg tablet Take 1 tablet (60 mg total) by mouth three (3) times daily as needed. 02/16/20 02/15/21  Odetta Pink., MD, PhD   trimethoprim 100 mg tablet Take 1 tablet (100 mg total) by mouth daily. 05/31/20   Kaitlyn Ram., MD       Physical Exam   Heart Rate:  [91-105] 105  Resp:  [18-35] 20  BP: (133-212)/(77-127) 133/96  NBP Mean:  [99-137] 99  SpO2:  [95 %-100 %] 97 %  I/O: No intake/output data recorded.    Gen:  cheerful and conversant, appears stated age, comfortable, no apparent distress   HEENT: sclerae anicteric, PERRL, dry mucous membranes, oropharynx clear  Neck: trachea midline, JVP nl  CV: regular rate, normal S1/S2, no murmurs, rubs, gallops, or thrills appreciated  Chest: clear to auscultation bilaterally, good air movement, no wheezes/rhonchi/rales   Abdomen: soft, non-distended, non-tender, normoactive bowel sounds, no hepatosplenomegaly or masses appreciated   Extremities: warm, well-perfused; no cyanosis, clubbing, or edema   Neuro: alert and appropriately interactive, normal speech, moves all x4 spontaneously    Labs   I have reviewed the labs, which are significant for the following:  Lab Results   Component Value Date    WBC 13.03 (H) 12/18/2020    HGB 11.4 (L) 12/18/2020    HCT 33.4 (L) 12/18/2020    PLT 333 12/18/2020    MCV 97.7 12/18/2020    MCHC 34.1 12/18/2020     Lab Results   Component Value Date    NA 136 12/18/2020    K 3.8 12/18/2020    CL 100 12/18/2020    CO2 27 12/18/2020    BUN 20 12/18/2020    CREAT 1.12 12/18/2020    GLUCOSE 122 (H) 12/18/2020    CALCIUM 9.6 12/18/2020     Lab Results   Component Value Date    ALT 11 12/18/2020    AST 28 12/18/2020    BILITOT 0.4 12/18/2020    ALKPHOS 76 12/18/2020    ALBUMIN 3.8 (L) 12/18/2020     Lab Results   Component Value Date    PT 12.9 12/18/2020    INR 1.0 12/18/2020       Microbiology   COVID-19/flu PCR 12/18/20 pending    Imaging   CTA chest w/wo contrast 12/18/20:  IMPRESSION:  ?  1.  No acute aortic process. No acute process within the chest.  2.  History of bilateral breast cancer without evidence of intrathoracic metastatic disease.  3.  Additional findings as above.    Assessment and Plan   [name] is a 24y F with bilateral breast cancer, myasthenia gravis, CKD stage 4, fibromyalgia, IBS, and COPD who p/w atypical chest pain x 2-3 days with new LBBB but negative troponin and relief w/ antacid medications most c/f upper GI pathology such as severe GERD, PUD, or gastritis.     # Atypical chest pain, POA - most likely GI etiology given hx of severe GERD, inability to take medications including her Dexilant for the last several days, worse with eating, improved with GI cocktail. However, given age and new LBBB, will trend trop/ECG to r/o MI. Will also obtain abdominal imaging to evaluate for perforation, obstruction, or abscess.  - consider GI consult in AM for EGD  - CTAP w/o contrast  - H pylori stool Ag  - repeat troponin/ECG in 4h  - pantoprazole 40 mg PO bid  - Maalox q4h PRN  - Zofran 4 mg IV/PO PRN for nausea    # SIRS, POA - meets 2/4 criteria w/ tachycardia and leukocytosis. Will further evaluate for infection.   # Leukocytosis, POA - with neutrophilic predominance.   # Sinus tachycardia, POA - suspect related to hypovolemia and pain.   - CTAP w/o contrast as above  - check blood cultures, UA  - trial IVF bolus  - trend CBC daily    CHRONIC/STABLE    # Bilateral breast cancer - followed by Dr. Oscar La, on anastrozole.  - continue anastrozole 1 mg PO daily    #  Myasthenia gravis  - continue azathioprine 75 mg PO bid  - per last neurology note, off pyridostigmine    # Major depression  - continue bupropion SR 200 mg PO bid  - continue duloxetine 60 mg PO daily    # COPD  - Advair while inpatient (on Breo Ellipta at home)  - albuterol PRN for SOB/wheezing    # Interstitial cystitis  # UTI prevention  - continue Hiprex 1 g PO bid    # HTN  - continue losartan 25 mg PO daily    # Hx of DM2 with CKD stage 4 - last Hba1c 5.2%, not on medications.    # FEN/GI: regular diet, NPO at MN in case endoscopy is considered  # Prophylaxis: hold in case of procedure    # Tubes/Lines/Drains: none    Code Status: DNR (No CPR, defibrillation, intubation)  Contact:  Primary Emergency Contact: Beutner,[place]    # Disposition: Placed in Medical Observation.    I personally reviewed labs, imaging, ECG/cardiodiagnostic imaging, old records and obtained history from someone other than the patient. A total of 70 minutes were spent with greater than 50% spent face-to-face counseling patient and/or coordinating care.     Linnell Fulling, MD

## 2020-12-19 NOTE — Nursing Note
CRITICAL CARE TRANSPORT RN NOTE    11:29 PM  Patient transported from ED to 4SW on monitor (in ST). On O2 via NC at 2LPM. Vital signs stable. Tolerated transport well and was uneventful. All belongings with patient.  Settled into room.  Report given to [place]. All questions answered.    [facility] [address], [place]  12/18/2020  11:29 PM

## 2020-12-20 DIAGNOSIS — D508 Other iron deficiency anemias: Secondary | ICD-10-CM

## 2020-12-20 LAB — Basic Metabolic Panel
CHLORIDE: 104 mmol/L (ref 96–106)
CREATININE: 1.16 mg/dL (ref 0.60–1.30)

## 2020-12-20 LAB — Differential Automated: ABSOLUTE NEUT COUNT: 5 10*3/uL (ref 1.80–6.90)

## 2020-12-20 LAB — Magnesium: MAGNESIUM: 2 meq/L — ABNORMAL HIGH (ref 1.4–1.9)

## 2020-12-20 LAB — CBC: MEAN PLATELET VOLUME: 10.3 fL (ref 9.3–13.0)

## 2020-12-20 MED ORDER — ONDANSETRON 4 MG PO TBDP
4 mg | ORAL_TABLET | Freq: Four times a day (QID) | ORAL | 0 refills | Status: AC | PRN
Start: 2020-12-20 — End: ?

## 2020-12-20 MED ADMIN — LEVALBUTEROL HCL 0.31 MG/3ML IN NEBU: .31 mg | RESPIRATORY_TRACT | @ 07:00:00 | Stop: 2020-12-21 | NDC 76204070011

## 2020-12-20 MED ADMIN — LOSARTAN POTASSIUM 25 MG PO TABS: 25 mg | ORAL | @ 17:00:00 | Stop: 2020-12-21 | NDC 68084034611

## 2020-12-20 MED ADMIN — AZATHIOPRINE 50 MG PO TABS: 75 mg | ORAL | @ 04:00:00 | Stop: 2021-01-18 | NDC 68084022901

## 2020-12-20 MED ADMIN — AZATHIOPRINE 50 MG PO TABS: 75 mg | ORAL | @ 17:00:00 | Stop: 2020-12-21 | NDC 68084022901

## 2020-12-20 MED ADMIN — BUPROPION HCL ER (SR) 100 MG PO TB12: 200 mg | ORAL | @ 17:00:00 | Stop: 2020-12-21 | NDC 68084069711

## 2020-12-20 MED ADMIN — METHENAMINE HIPPURATE 1 G PO TABS: 1 g | ORAL | @ 04:00:00 | Stop: 2020-12-21 | NDC 50268054911

## 2020-12-20 MED ADMIN — ONDANSETRON 4 MG PO TBDP: 4 mg | ORAL | @ 20:00:00 | Stop: 2020-12-21 | NDC 57237007710

## 2020-12-20 MED ADMIN — FLUTICASONE-SALMETEROL 100-50 MCG/ACT IN AEPB: 1 | RESPIRATORY_TRACT | @ 17:00:00 | Stop: 2020-12-21 | NDC 00054032656

## 2020-12-20 MED ADMIN — PANTOPRAZOLE SODIUM 40 MG PO TBEC: 40 mg | ORAL | @ 04:00:00 | Stop: 2020-12-21 | NDC 50268063911

## 2020-12-20 MED ADMIN — ANASTROZOLE 1 MG PO TABS: 1 mg | ORAL | @ 17:00:00 | Stop: 2020-12-21 | NDC 60687011211

## 2020-12-20 MED ADMIN — FLUTICASONE-SALMETEROL 100-50 MCG/ACT IN AEPB: 1 | RESPIRATORY_TRACT | @ 04:00:00 | Stop: 2020-12-21 | NDC 00054032656

## 2020-12-20 MED ADMIN — DULOXETINE HCL 30 MG PO CPEP: 60 mg | ORAL | @ 17:00:00 | Stop: 2020-12-21 | NDC 00904704461

## 2020-12-20 MED ADMIN — PANTOPRAZOLE SODIUM 40 MG PO TBEC: 40 mg | ORAL | @ 17:00:00 | Stop: 2020-12-21 | NDC 50268063911

## 2020-12-20 MED ADMIN — LEVALBUTEROL HCL 0.31 MG/3ML IN NEBU: .31 mg | RESPIRATORY_TRACT | Stop: 2020-12-21 | NDC 76204070011

## 2020-12-20 MED ADMIN — BUPROPION HCL ER (SR) 100 MG PO TB12: 200 mg | ORAL | @ 04:00:00 | Stop: 2020-12-21 | NDC 68084069711

## 2020-12-20 MED ADMIN — METHENAMINE HIPPURATE 1 G PO TABS: 1 g | ORAL | @ 17:00:00 | Stop: 2020-12-21 | NDC 50268054911

## 2020-12-20 NOTE — Other
Patient's Clinical Goal:   Clinical Goal(s) for the Shift: VSS, cardiac monitor, pain mgmt  Identify possible barriers to advancing the care plan:   Stability of the patient: Moderately Unstable - medium risk of patient condition declining or worsening    Progression of Patient's Clinical Goal:     VS: VSS   Precautions: Fall risk  Neuro: AXO4  pain: Pt c/o chest pain, states she is at 3/10 and does not need prn   Nausea: Pt denies nausea     Cardiac: Pt c/o chest pain/SOB.  cardiac monitoring. Tachy  Resp:  Respirations even and unlabored. Non-monitored- 2L NS ; respiratory tx completed X1  Skin: No skin issues. CHG completed   GI: Last BM 10/23. Pt uses bathroom   GU: Pt voids in bathroom   LDA:   PIV 22G let forearm was pulled out by pt. This morning. Pending new IV   BMAT: Level 3 w/ walker   Diet:regular   Plan:      Continue to monitor chest pain/ cardiac monitor  Pain mgmt and GERD mgmt     Pt follow up with outpatient GI    Pt. Safety maintained, bed locked in lowest position with call light and belongings in reach, bed alarm on, non skip socks to feet. Hourly rounding implemented. No acute events. All needs met. Will endorse POC to oncoming RN.

## 2020-12-20 NOTE — Discharge Summary
[facility]ID [facility]Y SERVICE DISCHARGE SUMMARY  ADMIT DATE: 12/19/2019     DISCHARGE DATE: 12/20/2020  ATTENDING PHYSICIAN: Rober Minion., MD  NURSE PRACTITIONER: Samantha A. Maisie Fus, NP  PRIMARY ONCOLOGIST: Dr. Oscar La   PRIMARY CARE PROVIDER: York Ram., MD  LOS: 0  ADMISSION CATEGORY: Symptom Control  ADMISSION DIAGNOSES: Chronic interstitial cystitis [N30.10]  Myasthenia gravis with exacerbation (HCC/RAF) [G70.01]  Primary hypertension [I10]  Fibromyalgia [M79.7]  Acute chest pain [R07.9]  COPD mixed type (HCC/RAF) [J44.9]  Major depression, recurrent, chronic (HCC/RAF) [F33.9]  Malignant neoplasm of overlapping sites of both breasts in female, estrogen receptor positive (HCC/RAF) [C50.811, C50.812, Z17.0]  Type 2 diabetes mellitus with stage 3b chronic kidney disease, without long-term current use of insulin (HCC/RAF) [E11.22, N18.32]  DISCHARGE DIAGNOSES:   Patient Active Problem List   Diagnosis    Acute medial meniscal tear, left, subsequent encounter    Angioedema    Anxiety    Asthmatic bronchitis, mild persistent, with acute exacerbation    Chronic cholecystitis    Chronic interstitial cystitis    CKD (chronic kidney disease), stage III (HCC/RAF)    COPD mixed type (HCC/RAF)    Major depression, recurrent, chronic (HCC/RAF)    Diabetes mellitus (HCC/RAF)    Esophageal reflux    Eustachian tube dysfunction    Fibromyalgia    H/O arthroscopy    Insomnia    Myasthenia gravis with exacerbation (HCC/RAF)    Nocturnal hypoxemia    Osteoarthritis of knee    Renal cyst    Seasonal and perennial allergic rhinitis    Spondylosis without myelopathy or radiculopathy, lumbar region    Urethral caruncle    Vaginal atrophy    Benzodiazepine dependence (HCC/RAF)    Breast cancer (HCC/RAF)    Refractive error    Ptosis of eyelid    Pseudophakia of both eyes    Aortic calcification (HCC/RAF)    HTN (hypertension)    Immunosuppressed status (HCC/RAF)    Acute chest pain       Brief History of Present Illness:   See admission H&P for full details of admission.  Briefly, ''5y F with bilateral breast cancer, myasthenia gravis, DM2 with CKD stage 4, fibromyalgia, IBS, and COPD who p/w chest pain x 2-3 days.     For the last couple of weeks, hasn't felt well. About 5 days ago, moved from [place] to [place] to be near daughter. Two days ago, began having chest pain.     Pain is in center of chest. Feels like a knife when she takes deep breaths. Pain is constant. Blames it on the pollen in the air. Felt like burping would help relieve the pain. Has been burping, but it doesn't really help. Pain is worse with eating. Not worse with exertion. Per daughter, has had very poor PO intake this past week.     Has hx of severe acid reflux and takes Dexilant but has been unable to take it in the last 5 days due to nausea. Had a Nissen fundoplication in the past. Has had similar pain on and off for ''years'' but never this bad.   2-3 weeks ago, every time she looked at food, she'd get nauseous and throw up. This went on for about a week. Then symptoms resolved up until 2 days ago. Hasn't taken any oral medications in 5 days. Denies fever/chills. Denies recent NSAID or alcohol use.    Feels significantly better since receiving GI cocktail in ER.''  Hospital Course:   [name] is a 85 y.o. female with bilateral breast cancer, myasthenia gravis, CKD stage 4, fibromyalgia, IBS, and COPD who p/w atypical chest pain x 2-3 days with new LBBB but negative troponin and relief w/ antacid medications most c/f upper GI pathology such as severe GERD, PUD, or gastritis. CT AP without acute abnormality, noted to have      # Atypical chest pain, POA - most likely GI etiology given hx of severe GERD, inability to take medications including her Dexilant for the last several days due to vertigo induced n/v, worse with eating, improved with GI cocktail. Consider component of pleurisy related given intermittently associated with breathing with chronic COPD. ACS least likely per below.  -CTAP: no acute abnormality, moderate hiatal hernia w/post surgical changes likely r/t prior Nissen fundoplication. Initiated pantoprazole 40 mg PO bid with improvement in symptoms with PPI and GI cocktail w/maalox. Patient able to tolerate all meals without recurrent chest pain. GI was consulted and given improvement in symptoms, EGD deferred. Continue Maalox q4h PRN. H pylori stool Ag discontinued given long term PPI use.  - Continue dexelant outpatient.  - Ondansetron 4 mg PO q6hr PRN Rx for nausea  - Outpatient GI follow-up     # SIRS, POA: Improved. Meets 2/4 criteria w/ tachycardia and leukocytosis. Low concern for infection. CTA and CT AP with no acute process in chest, abd, or pelvis. Bld Cx prelim NGTD.    # Sinus tachycardia, POA: suspect related to hypovolemia and pain. Noted lookng at history HR 120-130s in clinic. Improved with bolus 110s to low 100s  # Leukocytosis, POA: Resolved. with neutrophilic predominance. Suspect reactive. CTA and CT AP with no acute process in chest, abd, or pelvis. Bld Cx prelim NGTD. Bld Cx (10/23): Prelim NGTD. Patient denies infectious symptoms.     #Myocardial Ischemia demand, POA: Suspect component due to pain. EKG with new LBBB. Troponin <0.04->0.06->0.07->0.05. 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 Breast DCIS and unable to tolerate tamoxifen and anastrozole. 5/22 Dx with bilateral breast mass, Rt breast ER/PR + Her2 - and Lt. Breast ER+PR-Her2neg. Plan for neoadjuvant endocrine therapy. Initially started on letrazole with inability to tolerate and transitioned to anastrazole  - Continue anastrazole 1mg  PO daily  - Follow-up with Dr. Oscar La scheduled 11/1     CHRONIC/STABLE  # Myasthenia gravis, POA: Home azathioprine. Per last neurology note, off pyridostigmine. Continue azathioprine 75 mg PO bid  # Major depression, POA: Home bupropion and duloxetine. Continue bupropion SR 200 mg PO bid & duloxetine 60 mg PO daily  # COPD, POA: Home Breo Ellipta. Advair while inpatient. Albuterol PRN changed to levalbuterol inpatient per HR above. Home O2 use at night 2 L NC. Pt on RA during day inpatient. F/U Dr Brayton El requested.   # Interstitial cystitis, POA:  # UTI prevention,POA: Home methenamine hippurate. Continue methenamine hippurate 1 g PO bid  # HTN, POA: Home losartan. Continue losartan 25 mg PO daily    Significant Studies/Procedures:   CT abd+pelvis wo contrast    Result Date: 12/19/2020  IMPRESSION: 1.  No acute CT abnormality in the abdomen or pelvis. 2.  Moderate hiatal hernia with postsurgical changes likely related to reported prior Nissen fundoplication. 3.  Left-sided diverticulosis without evidence diverticulitis. 4.  History of bilateral breast cancer without evidence of metastatic disease within  the abdomen or pelvis. I, Trish Mage, M.D., have reviewed this radiological study personally and I am in full agreement with the findings of the report presented here. Dictated by: Kirk Ruths   12/19/2020 2:19 AM Signed by: Clemon Chambers Deol   12/19/2020 5:05 AM    CT chest angiogram wo+w contrast    Result Date: 12/18/2020  IMPRESSION: 1.  No acute aortic process. No acute process within the chest. 2.  History of bilateral breast cancer without evidence of intrathoracic metastatic disease. 3.  Additional findings as above. I, Reubin Milan, M.D., have reviewed this radiological study personally and I am in full agreement with the findings of the report presented here. Dictated by: Kirk Ruths   12/18/2020 7:39 PM Signed by: Karolee Ohs Rahsepar   12/18/2020 8:40 PM    Consult:   GI    Pending Studies/Evaluation Needing Follow-up  Last 24 Hour/Overnight Events:   None    Discharge Condition:   Stable.  ECOG Performance Status:  1-Restricted in physically strenuous activity but ambulatory and able to carry out work of a light or sedentary nature    Discharge Physical Examination:   BP 129/68  ~ Pulse (!) 100  ~ Temp 36.6 ?C (97.8 ?F) (Oral)  ~ Resp 20  ~ Ht 1.651 m (5' 5'')  ~ Wt 70.2 kg (154 lb 12.8 oz)  ~ SpO2 95%  ~ BMI 25.76 kg/m?   Gen: appears stated age, comfortable, no apparent distress   HEENT: sclerae anicteric, PERRL, dry mucous membranes, oropharynx clear  Neck: trachea midline, JVP nl  CV: tachycardic, regular, normal S1/S2, no murmurs, rubs, gallops, or thrills appreciated  Chest: CTAB, good air movement, no wheezes/rhonchi/rales   Abdomen: soft, non-distended, non-tender, NABS  Extremities: warm, well-perfused; no cyanosis, clubbing, or edema   Neuro: AOx4, appropriately interactive, normal speech, moves all x4 spontaneously    Patient Instructions:   Precautions: Please refer to AVS.  Activity: as tolerated  Diet: regular- GERD DC patient education provided  Wound Care: none needed    Disposition:   Home    Follow-up Appointments:     Future Appointments   Date Time Provider Department Center   01/24/2021  4:40 PM York Ram., MD GERI [facility] 420 MEDICINE   02/14/2021  1:20 PM Fredda Hammed., MD HEM/ONC 230 MEDICINE       Home Health Services/DME Set Up?  No  If no to the above, please indicate reason: Not Indicated    Pain Medications/Other Meds Delivered to room? No  If no to the above please indicate reason: Patient preference, sent Ondansetron Rx, no further refills needed    Primary Oncology Appt within 1 week follow-up set up or requested?  Yes      Outpatient Work-Up Needed:   None    Referrals:   []  P[facility]ative Care  []  [facility]  []  Interventional Radiology for scheduled paracentesis or thoracentesis  []  Pain Management  [x]  Other: GI referral  []  None  Primary Oncologist Notified of Discharge Plan?  Yes    Discharge Medications:        Medication List        START taking these medications      ondansetron ODT 4 mg disintegrating tablet  Commonly known as: Zofran ODT  Take 1 tablet (4 mg total) by mouth every six (6) hours as needed for Nausea or Vomiting.            CONTINUE taking these medications  Albuterol Sulfate Aepb  Takes 1 to 2 puffs every 4 hours as needed for wheezing or SOB Inhale Takes 1 to 2 puffs every 4 hours as needed for wheezing or SOB ..     anastrozole 1 mg tablet  Commonly known as: Arimidex  Take 1 tablet (1 mg total) by mouth daily.     ascorbic acid 500 mg tablet  Commonly known as: VITAMIN C  Take 1 tablet (500 mg total) by mouth two (2) times daily with meals.     azaTHIOprine 50 mg tablet  Commonly known as: Imuran  Take one and one-half tablet (75 mg total) by mouth two (2) times daily.     buPROPion (SR) 200 mg 12 hr tablet  Commonly known as: WELLBUTRIN SR  Take 1 tablet (200 mg total) by mouth two (2) times daily.     * clonazePAM 1 mg tablet  Commonly known as: KlonoPIN  0.5 mg in am and 1 mg in pm.     * clonazePAM 0.5 mg tablet  Commonly known as: KlonoPIN  .25 in am and 0.5 in pm.     DEXILANT 60 mg DR capsule  Generic drug: dexlansoprazole     DULoxetine 20 mg DR capsule  Commonly known as: Cymbalta  Take 3 capsules (60 mg total) by mouth daily.     estradiol 0.1 mg/g vaginal cream  Commonly known as: Estrace  Place 1 g vaginally three (3) times a week.     fluticasone 50 mcg/act nasal spray  Commonly known as: Flonase  Spray 1 spray by nasal route daily.     fluticasone-vilanterol 100-25 mcg/inh inhaler  Commonly known as: BREO ELLIPTA  Inhale 1 puff daily.     losartan 25 mg tablet  Commonly known as: Cozaar  Take 1 tablet (25 mg total) by mouth daily.     meclizine 25 mg tablet  Commonly known as: Antivert  Take 1 tablet (25 mg total) by mouth three (3) times daily as needed for Dizziness.     methenamine hippurate 1 g tablet  Commonly known as: Hiprex  Take 1 tablet (1 g total) by mouth two (2) times daily.     montelukast 10 mg tablet  Commonly known as: Singulair     pyridostigmine 60 mg tablet  Commonly known as: Mestinon  Take 1 tablet (60 mg total) by mouth three (3) times daily as needed.     trimethoprim 100 mg tablet  Commonly known as: Trimpex  Take 1 tablet (100 mg total) by mouth daily.           * This list has 2 medication(s) that are the same as other medications prescribed for you. Read the directions carefully, and ask your doctor or other care provider to review them with you.                   Where to Get Your Medications        These medications were sent to [facility] [place], [place] - [address] [address] [place], [place]. [address]  [address] [place] [address]      Phone: [phone]   ondansetron ODT 4 mg disintegrating tablet       This patient was seen and discussed with attending physician Lijah Bourque, Valetta Fuller., MD, with whom the above assessment and plan was formulated.  12/20/2020 at 10:51 AM    Author: Lelon Mast A. Maisie Fus, NP  The patient was seen and evaluated by me today with the houseofficer. I agree with the outline of the hospital course and the discharge plans as detailed in the NP's discharge summary.    Rober Minion 12/20/2020 9:46 PM

## 2020-12-20 NOTE — Progress Notes
RN provided pt and daughter discharge & medication instructions. Pt demonstrated understanding. VSS. IV dc'd. Pt safely discharge by discharge RN.

## 2020-12-20 NOTE — Consults
INPATIENT GASTROENTEROLOGY - CONSULTATION    Date of service: 12/19/2020   Hospital Day: 0   Primary Attending: Rober Minion., MD  GI Consult Attending:  Dr. Elita Boone    Reason for Consultation:   GERD, chest pain    History of Present Illness:   [name] is a 85 y.o. female with history of bilateral breast cancer, CKD 4, COPD/asthma (on nocturnal O2), myasthenia gravis, who is presenting with chest pain and shortness of breath, GI consulted for GERD evaluation.    Patient reports about 5 days ago had sudden onset of vertigo that was associated with nausea and vomiting, occurring almost daily. Reports that she was unable to take most of her medications including dexilant. About 2-3 days ago, began to have burning pain in the center of her chest, lasted throughout most of the day, worsened by meals. Also reported that her breathing worsened during this time period and also described that the pain in her chest worsened when taking a deep breath in. Otherwise denies dysphagia, odynophagia, abdominal pain, current nausea/vomiting, melena/hematochezia, hematemesis. Since being admitted, patient reports her pain essentially resolved when restart a PPI, currently asymptomatic. ROS otherwise negative other than mentioned above.    In terms of GERD history, reports that she has a long standing history that was refractory to many PPIs, and now is on Dexilant, with excellent control of symptoms. Had Nissen fundoplication ~2005 due to issue with GERD. Had previous EGD around then, reports no concerns at that time. Has concerns regarding anesthesia and her myasthenia gravis.    PMH:     Past Medical History:   Diagnosis Date    Breast cancer (HCC/RAF) 12/20/2018       Past Surgical History:   History reviewed. No pertinent surgical history.    Medications:     Medications Prior to Admission   Medication Sig Dispense Refill Last Dose    Albuterol Sulfate AEPB Takes 1 to 2 puffs every 4 hours as needed for wheezing or SOB Inhale Takes 1 to 2 puffs every 4 hours as needed for wheezing or SOB .. 3 each 3     anastrozole 1 mg tablet Take 1 tablet (1 mg total) by mouth daily. 30 tablet 11     azaTHIOprine 50 mg tablet Take one and one-half tablet (75 mg total) by mouth two (2) times daily. 270 tablet 3     buPROPion, SR, (WELLBUTRIN SR) 200 mg 12 hr tablet Take 1 tablet (200 mg total) by mouth two (2) times daily. 180 tablet 3     DEXILANT 60 MG DR capsule Take 60 mg by mouth daily.       DULoxetine 20 mg DR capsule Take 3 capsules (60 mg total) by mouth daily. 270 capsule 3     fluticasone 50 mcg/act nasal spray Spray 1 spray by nasal route daily. 48 g 3     fluticasone-vilanterol (BREO ELLIPTA) 100-25 mcg/inh inhaler Inhale 1 puff daily. 180 each 3     losartan 25 mg tablet Take 1 tablet (25 mg total) by mouth daily. 90 tablet 3     meclizine 25 mg tablet Take 1 tablet (25 mg total) by mouth three (3) times daily as needed for Dizziness. 30 tablet 1     methenamine hippurate 1 g tablet Take 1 tablet (1 g total) by mouth two (2) times daily. 60 tablet 5     pyridostigmine 60 mg tablet Take 1 tablet (60 mg total) by mouth three (3)  times daily as needed. 90 tablet 2     trimethoprim 100 mg tablet Take 1 tablet (100 mg total) by mouth daily. 90 tablet 3     ascorbic acid 500 mg tablet Take 1 tablet (500 mg total) by mouth two (2) times daily with meals. 60 tablet 11     clonazePAM 0.5 mg tablet .25 in am and 0.5 in pm.       clonazePAM 1 mg tablet 0.5 mg in am and 1 mg in pm.       estradiol (ESTRACE) 0.1 mg/g vaginal cream Place 1 g vaginally three (3) times a week. 42.5 g 6     GAMMAGARD 30 GM/300ML SOLN Every 2 weeks.       GAMUNEX-C 10 GM/100ML infusion        GAMUNEX-C 20 GM/200ML infusion        meropenem 1 g injection        montelukast 10 mg tablet Take 10 mg by mouth as needed for.          Continuous Infusions:  PRN Meds:.aluminum-magnesium hydroxide-simethicone, levalbuterol, magnesium sulfate IV **OR** magnesium sulfate IV, ondansetron injection/IVPB **OR** ondansetron ODT, potassium chloride **OR** potassium chloride IV (central), potassium chloride IV (central), potassium chloride IV (central)    Allergies:   Beta adrenergic blockers, Levofloxacin, Macrolides and ketolides, Sulfa antibiotics, Botulinum toxins, Statins, and Plastic tape    Family History:   Reviewed, non-contributory    Social History:     Social History     Tobacco Use    Smoking status: Former Smoker     Quit date: 1985     Years since quitting: 37.8    Smokeless tobacco: Former Neurosurgeon   Substance Use Topics    Alcohol use: Not Currently    Drug use: Never       Review of Systems:   A 10 point review of systems was obtained and is negative except per the HPI.    Physical Examination:   Vital Signs: BP 142/65  ~ Pulse (!) 105  ~ Temp 37.1 ?C (98.8 ?F) (Oral)  ~ Resp 19  ~ Ht 1.651 m (5' 5'')  ~ Wt 69 kg (152 lb 3.2 oz)  ~ SpO2 (!) 91%  ~ BMI 25.33 kg/m?   Gen: NAD  HEENT: EOMI, anicteric sclera, clear oropharynx.   Pulm: mildly increased WOB on 2L NC, wheezing  CVS: RRR  Abd: soft, nontender, nondistended, no rebound or guarding  Ext: no LE edema   Skin: warm, dry, no obvious rashes  Neuro: no gross focal neurologic deficits    Labs:     Recent Labs     12/19/20  0232 12/18/20  1839   WBC 10.43* 13.03*   HGB 10.8* 11.4*   HCT 32.1* 33.4*   PLT 332 333     Recent Labs     12/19/20  0232 12/18/20  1839   NA 137 136   K 4.2 3.8   CL 102 100   CO2 27 27   ANIONGAP 8 9   BUN 19 20   CREAT 1.12 1.12     Recent Labs     12/18/20  1839   BILITOT 0.4   ALKPHOS 76   ALT 11   AST 28     Recent Labs     12/18/20  1839   INR 1.0     No results found for: SRWEST, CRP  Lab Results   Component Value Date  TIBC 356 07/14/2019    FERRITIN 57 07/14/2019       Imaging studies   CT abd+pelvis wo contrast    Result Date: 12/19/2020  CT ABD+PELVIS WO CONTRAST CLINICAL HISTORY: Abdominal pain, severe epigastric pain with nausea/vomiting, hx of nissen fundoplicatoin. COMPARISON: CT chest 12/19/2020. TECHNIQUE: On a multirow-detector CT scanner, a volumetric scan was performed through the abdomen and pelvis without the administration of intravenous contrast. RADIATION DOSE: The patient received the following exposure event(s) during this study, and the dose reference values for each are as shown (CTDIvol in mGy, DLP in mGy-cm). Note that the values are not patient dose but numbers generated from scan acquisition factors based on 32 cm (L) and/or 16 cm (S) phantoms and may substantially under-estimate or over-estimate actual patient dose based on patient size and other factors. 1Abd_Pel_without, CTDI(L): 8.2, DLP: 400.4 FINDINGS: ONCOLOGIC FINDINGS: History of bilateral breast cancer with: - No metastatic disease in the abdomen or pelvis. ADDITIONAL FINDINGS: Lack of intravenous contrast compromises evaluation of perfusion and for isodense lesions. Lower chest: Please see separate chest report. Eventration of the right hemidiaphragm. Liver: Unremarkable. Gallbladder and bile ducts: Surgically absent gallbladder. No biliary dilatation. Spleen: Unremarkable. Pancreas: Diffusely atrophic. Adrenals: Unremarkable. Kidneys and ureters: Residual contrast within the bilateral collecting systems limits evaluation for stones. Bilateral renal cysts, measuring up to 2.5 cm in the left lower pole (2-60). Bowel: Postsurgical changes at the GE junction, likely related to prior reported Nissen fundoplication. Moderate hiatal hernia. Left-sided diverticulosis without evidence of diverticulitis. Normal appendix. No bowel wall thickening or obstruction. Bladder: Distended with contrast. Reproductive organs: Normal uterus. No adnexal masses. Lymph nodes: No lymphadenopathy. Peritoneum: No free air, free fluid, or fluid collections. Vessels: Mild atherosclerosis. Abdominal wall: Unremarkable. Bones: Degenerative changes of the lumbar spine and bilateral hips..     IMPRESSION: 1.  No acute CT abnormality in the abdomen or pelvis. 2.  Moderate hiatal hernia with postsurgical changes likely related to reported prior Nissen fundoplication. 3.  Left-sided diverticulosis without evidence diverticulitis. 4.  History of bilateral breast cancer without evidence of metastatic disease within the abdomen or pelvis. I, Trish Mage, M.D., have reviewed this radiological study personally and I am in full agreement with the findings of the report presented here. Dictated by: Kirk Ruths   12/19/2020 2:19 AM Signed by: Clemon Chambers Deol   12/19/2020 5:05 AM    CT chest angiogram wo+w contrast    Result Date: 12/18/2020  CT CHEST ANGIOGRAM WO+W CONTRAST CLINICAL HISTORY: r/o dissection. COMPARISON: PET/CT 08/18/2020.. TECHNIQUE: On a multirow-detector CT scanner, a volumetric pre- and post-contrast angiogram of the chest was performed. Multiplanar reformations and MIP images in the coronal, sagittal and sagittal oblique projections were reconstructed from the vascular  sequence. CONTRAST: iodixanol (Visipaque) 320 mg/mL inj 100 mL. RADIATION DOSE: The patient received the following exposure event(s) during this study, and the dose reference values for each are as shown (CTDIvol in mGy, DLP in mGy-cm). Note that the values are not patient dose but numbers generated from scan acquisition factors based on 32 cm (L) and/or 16 cm (S) phantoms and may substantially under-estimate or over-estimate actual patient dose based on patient size and other factors. PreMonitoring, CTDI(L): 0.7, DLP: 0.7;Monitoring, CTDI(L): 3.9, DLP: 3.8;1Routine_CTA_Thoracic_Aorta, CTDI(L): 15.7, DLP: 470;1Routine_CTA_Thoracic_Aorta, CTDI(L): 11.9, DLP: 407.3 ONCOLOGIC FINDINGS: History of bilateral breast cancer with: - Bilateral breast postbiopsy clips in areas of known biopsy-proven malignancy. ADDITIONAL FINDINGS: Pulmonary circulation: Although not timed for the evaluation of pulmonary emboli. No pulmonary emboli. The  pulmonary artery is normal in caliber measuring up to 2.3 cm. Aorta: No thoracic aortic intramural hematoma, dissection, pseudoaneurysm, or ulcerated plaque. Calcified and noncalcified atherosclerotic plaque. Retropharyngeal course of the right common carotid artery. Lungs: Patent tracheobronchial tree. Diffuse bronchial and bronchiolar wall thickening. Upper lobe predominant moderate centrilobular emphysema. Left apex calcified granuloma (5-74). Stable subcentimeter pulmonary nodules, for example the right lower lobe (5-197). No suspicious solid pulmonary nodules. Multifocal bibasilar atelectasis/scarring. Lymph nodes: No mediastinal, hilar, or axillary lymphadenopathy by size criteria. Pleura: No pleural effusions. No pneumothorax. Cardiovascular: Normal heart size. No pericardial effusion. Mild mitral valve annulus calcification. Osseous: No suspicious osseous lesions. Degenerative changes of the spine. Other: Heterogeneous thyroid gland containing multiple nodules with the largest measuring up to 7 mm in the right thyroid lobe (5-22). Upper abdomen: Moderate hiatal hernia. Status post cholecystectomy. Diffusely atrophic pancreas. Subcentimeter hyperenhancing focus in the spleen (5-239) is nonspecific but likely related to heterogeneous opacification of the spleen on arterial phase.     IMPRESSION: 1.  No acute aortic process. No acute process within the chest. 2.  History of bilateral breast cancer without evidence of intrathoracic metastatic disease. 3.  Additional findings as above. I, Reubin Milan, M.D., have reviewed this radiological study personally and I am in full agreement with the findings of the report presented here. Dictated by: Kirk Ruths   12/18/2020 7:39 PM Signed by: Karolee Ohs Rahsepar   12/18/2020 8:40 PM      Endoscopy:      None in system    Impression:    85 y.o. female with history of bilateral breast cancer, CKD 4, COPD/asthma (on nocturnal O2), myasthenia gravis, who is presenting with chest pain and shortness of breath, GI consulted for GERD evaluation.    #GERD  #Chest pain  #History of Nissen fundoplication  Patient with long standing history of difficult to control GERD, now on outpatient Dexilant with good control of symptoms. Had episode of vertigo outpatient a/w nausea/vomiting and unable to take PO meds and developing chest pain. Based on description, appears consistent with GERD (given that cardiac causes ruled out per primary) vs. Pleurisy given that it is associated breathing. Does note that with GI cocktail of Maalox and now on PPIs that pain as resolved, making it suggestive of her previous GERD pain, and is currently asymptomatic. No other red flag symptoms at this time, patient is also very high risk from procedural stand point, noted to have new O2 requirement, has SOB/wheezing, also has hx of myasthenia gravis that would make anesthesia higher risk. Would not recommend endoscopic eval at this time as she has had resolution of symptoms.    Recommendations:   - do not recommend endoscopic eval at this time  - continue pantoprazole 40 mg BID while inpatient, restart Dexilant outpatient  - optimization of pulmonary status per primary team  - patient may follow up with outpatient GI for hx of difficult to control GERD    Thank you for the opportunity of seeing this patient in consultation. Patient was seen and discussed with my attending Dr. Elita Boone. Recommendations were communicated with the primary team. We will sign off at this time.    SIGNED:  Phil Dopp, PGY-4  Pager [phone] (after 5pm please page on call GI fellow)    12/19/2020 7:32 PM      Patient seen and discussed with GI Fellow on 12/19/20. I agree with the above assessment and plan.

## 2020-12-21 ENCOUNTER — Telehealth: Payer: BLUE CROSS/BLUE SHIELD

## 2020-12-21 ENCOUNTER — Non-Acute Institutional Stay: Payer: BLUE CROSS/BLUE SHIELD

## 2020-12-21 NOTE — Telephone Encounter
Discharge Followup Appointment Info    Is the patient being discharged to SNF?: No   TIme Frame: Within 7 Days   Sched with PCP?: Patient's [facility]   PCP Name: Molinda Bailiff., MD   Date and Time of appt: 12/28/20  1:40 PM PDT   Did you schedule any Specialty visits?: Yes   Were there any scheduling issues?: No   Additional Appts/Tests/Info   Future Appointments  12/28/2020  1:40 PM    Molinda Bailiff., MD     GERI IMS 420        MEDICINE  01/03/2021  1:20 PM    Kathi Simpers., MD       HEM/ONC 230         MEDICINE  01/12/2021 3:30 PM    Myrtie Cruise., MD    GASTRO 2020         MEDICINE    I called patient at [phone] and confirmed.  Mailed out letters.

## 2020-12-24 LAB — Blood Culture Detection
BLOOD CULTURE FINAL STATUS: NEGATIVE
BLOOD CULTURE FINAL STATUS: NEGATIVE

## 2020-12-27 NOTE — Progress Notes
HEMATOLOGY/ONCOLOGY OUTPATIENT NOTE    PATIENT: [name]  MRN: [id number]  DOB: [birth year]  DATE OF SERVICE: 01/03/2021  Referring MD: York Ram., MD    Reason for Consultation/ Chief Complaint:  Bilateral breast cancer    HPI   Talaya Lamprecht is a 85 y.o. female with PMH including myasthenia gravis, asymmetric hearing loss, fibromyalgia, DM with CKD, major depression, interstitial cystitis, IBS, COPD who presents to establish care for breast cancer.     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 07/19/2017 Screening detected right breast calcifications and distortion in the upper outer quadrant: Biopsy DCIS+ ALH +CSL; 4 cm apart, axilla negative.    Neoadjuvant tamoxifen started 10/30/17, stopped after 4-6 weeks due to dry mouth and mouth sores. Switched to anastrozole 01/29/18, discontinued because it made her throat dry. Declined additional antiestrogen therapy.   Moved on October of 2020 to [place]   ?  Ms. Covey then noted left breast pain in April 2022.     Underwent a diagnostic bilateral breast imaging 06/13/2020 which revealed:   ?- Right breast: solid mass with indistinct margins measuring 14 x 4 x 10 mm in the right breast at 1 o'clock located 6 centimeters from the nipple with associated microclip,  not parallel solid mass with irregular margins measuring 8 x 4 x 5 mm seen in the right breast at 12 o'clock, upper outer quadrant located 6 centimeters from the nipple.   ?- Left breast: irregular mass with angular margins measuring 10 x 9 x 9 mm seen in the left breast at 11 o'clock located 5 centimeters from the nipple, axillary lymph node in the left axilla with cortical thickness measuring up to 6 mm.    R breast biopsy at 12:00, 6 cm from nipple on 07/13/2020 revealed invasive ductal carcinoma with lobular and focal cribriform features, grade 2 ER (+) PR (+) HER2 (-), Ki-67 5%    L breast biopsy at 11:00, 5 cm from nipple on 07/13/2020 revealed invasive ductal carcinoma with lobular features, grade 2 ER (+) PR (-) HER2 (-), Ki-67 10%  ?  Breast MRI performed on 08/05/2020 that redemonstrated multifocal right breast malignancy and left breast IDC with an additional 4.3cm of NME that spanned nearly the entire left breast that appears suspicious.     [facility] genetic testing (-) for pathogenic variants.     08/17/2020 Patient presents to establish care for breast cancer. She has seen Dr. Janee Morn to discuss surgical treatment, and is quite worried about anesthesia with her myasthenia gravis . She is interested in neoadjuvant therapy     09/13/2020 Patient presents for follow-up of breast cancer. PETCT on 08/22/2020 with no evidence of FDG avid metastatic disease. DXA scan on 08/26/2020 demonstrated osteopenia of the left femoral neck and total hip.     10/28/2020 Patient presents for follow-up of breast cancer. Labs on 09/13/2020 showed WBC 5.3, hgb 11.4, platelets 300, ANC 3.58. Endorses fatigue, frequent diarrhea. BM anytime she eats, usually about 3 times a day. Diarrhea is more frequent in the morning. Also reports hot flashes. Breasts are tender, using a larger bra size.     11/25/2020 Patient presents for follow-up of breast cancer. Labs on 9/2/202 showed WBC 6.7, hgb 12.0, platelets 384,  ANC 4.80. Breast MRI on 11/18/2020 with stable to slightly decreased size of known biopsy proven malignancies in the right breast, decreased size of the irregular mass with microclip at 11:00, 5 cm  from the nipple, consistent with known malignancy, nonmass enhancement spanning 40 mm has also slightly decreased in size and now demonstrates subthreshold kinetics, this remains suspicious, previously described oval enhancing mass at 4:00 is no longer identified.     01/03/2021 Patient presents for follow-up of breast cancer. Labs on 12/20/2020 showed WBC 6.4, hgb 9.7, platelets 278, ANC 5.00.     Hospital admission 10/23-10/25/2022. P/w?atypical?chest pain x 2-3 days?with new LBBB but negative troponin and relief w/ antacid medications most c/f upper GI pathology such as severe GERD, PUD, or gastritis.?CT AP without acute abnormality. Initiated pantoprazole 40 mg PO bid with improvement in symptoms with PPI and GI cocktail w/maalox. Patient able to tolerate all meals without recurrent chest pain. GI was consulted and given improvement in symptoms, EGD deferred. Continue?Maalox q4h PRN. H pylori stool Ag discontinued given long term PPI use. Continue dexelant outpatient. Ondansetron?4 mg PO q6hr PRN Rx for nausea. Outpatient GI follow-up.        ECOG performance status: 0  PAST HISTORY   PMH:  Patient Active Problem List    Diagnosis Date Noted   ? Atypical chest pain 12/20/2020   ? Acute chest pain 12/18/2020   ? Immunosuppressed status (HCC/RAF) 03/21/2020   ? HTN (hypertension) 10/31/2019   ? Aortic calcification (HCC/RAF) 09/25/2019   ? Refractive error 04/28/2019   ? Ptosis of eyelid 04/28/2019   ? Pseudophakia of both eyes 04/28/2019   ? Benzodiazepine dependence (HCC/RAF) 12/20/2018   ? Breast cancer (HCC/RAF) 12/20/2018   ? Spondylosis without myelopathy or radiculopathy, lumbar region 11/28/2018   ? Anxiety 11/27/2018   ? CKD (chronic kidney disease), stage III (HCC/RAF) 11/27/2018   ? Nocturnal hypoxemia 04/07/2018     Last Assessment & Plan:   We discussed potential impact of hypoxemia from her COPD on other body function  Plan-overnight oximetry     ? Major depression, recurrent, chronic (HCC/RAF) 12/16/2017   ? Diabetes mellitus (HCC/RAF) 12/16/2017   ? Osteoarthritis of knee 05/17/2017   ? H/O arthroscopy 03/06/2017   ? Myasthenia gravis with exacerbation (HCC/RAF) 12/18/2016   ? Vaginal atrophy 08/16/2016   ? Acute medial meniscal tear, left, subsequent encounter 07/18/2016   ? Renal cyst 03/12/2016   ? Asthmatic bronchitis, mild persistent, with acute exacerbation 12/29/2015     Last Assessment & Plan:   Continue rescue inhaler as needed. Try adding sample Bevespi maintenance inhaler to see if this helps.  She is currently on prednisone 20 mg daily maintenance and I don't think adding a steroid inhaler component will make any difference.     ? Urethral caruncle 07/26/2015   ? Chronic cholecystitis 01/14/2015   ? Angioedema 05/23/2014     Attributed to injected methylprednisolone    Last Assessment & Plan:   We discussed limited ability to test for possible triggers. We are asking our labs sources about ability to test against methylprednisolone for injection.  A different brand might be worth trying. She can try pre-medicating with an antihistamine.     ? Chronic interstitial cystitis 10/01/2011   ? Insomnia 05/31/2007     Qualifier: Diagnosis of   By: Maple Hudson MD, Clinton D    Last Assessment & Plan:   I'm concerned that some of her sleep problems may reflect obstructive sleep apnea based on her palate and which she can tell me about her sleep patterns without a witness. We also want to know about oxygenation during sleep.  Plan-schedule sleep study  Qualifier: Diagnosis of  ByMaple Hudson MD, Vindex.Nunnery D      Last Assessment & Plan:   I'm concerned that some of her sleep problems may reflect obstructive sleep apnea based on her palate and which she can tell me about her sleep patterns without a witness. We also want to know about oxygenation during sleep.  Plan-schedule sleep study     ? COPD mixed type (HCC/RAF) 04/30/2007     Office Spirometry 03/16/2016-severe obstructive airways disease FVC 1.59/59%, FEV1 0.98/49%, ratio 0.62, FEF 25-75% 0.42/28%  PFT 04/16/16-severe obstructive airways disease with slight response to bronchodilator, severe diffusion defect. FEV1/FVC 0.64, DLCO 45%    Last Assessment & Plan:   Severe obstructive airways disease. She can't hear herself wheeze.  Plan-try samples of Trelegy instead of Anoro for comparison    Overview:   Office Spirometry 03/16/2016-severe obstructive airways disease FVC 1.59/59%, FEV1 0.98/49%, ratio 0.62, FEF 25-75% 0.42/28%  PFT 04/16/16-severe obstructive airways disease with slight response to bronchodilator, severe diffusion defect. FEV1/FVC 0.64, DLCO 45%    Last Assessment & Plan:   Severe obstructive airways disease. She can't hear herself wheeze.  Plan-try samples of Trelegy instead of Anoro for comparison  Office Spirometry 03/16/2016-severe obstructive airways disease FVC 1.59/59%, FEV1 0.98/49%, ratio 0.62, FEF 25-75% 0.42/28%  PFT 04/16/16-severe obstructive airways disease with slight response to bronchodilator, severe diffusion defect. FEV1/FVC 0.64, DLCO 45%    Last Assessment & Plan:   Severe COPD but mainly emphysema with limited bronchospasm component that could be addressed with bronchodilators.  Plan-try sample Bevespi as a maintenance controller, refill pro-air     ? Esophageal reflux 04/30/2007     History of fundoplication     Last Assessment & Plan:   Emphasized continued attention to antireflux measures. Reminded that reflux can be a significant trigger for irritable airways, cough and wheeze.  History of fundoplication       Last Assessment & Plan:   Emphasized continued attention to antireflux measures. Reminded that reflux can be a significant trigger for irritable airways, cough and wheeze.     ? 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 Management is mostly by her ENT physicians.     ? Fibromyalgia 04/30/2007     Qualifier: Diagnosis of   By: Yetta Barre CNA/MA, Rose Fillers: Diagnosis of   By: Yetta Barre CNA/MA, Shanda Bumps     ? Seasonal and perennial allergic rhinitis 04/30/2007     Qualifier: Diagnosis of   By: Yetta Barre CNA/MA, Shanda Bumps     Last Assessment & Plan:   She is now on prednisone 20 mg daily. I suggested she wait until her myasthenia status is stabilized. Then if she needs to she couldn't seek evaluation at one of the allergy practices in town as discussed, since our vaccine program will be closing.  Qualifier: Diagnosis of   By: Yetta Barre CNA/MA, Jessica       Last Assessment & Plan:   She is now on prednisone 20 mg daily. I suggested she wait until her myasthenia status is stabilized. Then if she needs to she couldn't seek evaluation at one of the allergy practices in town as discussed, since our vaccine program will be closing.     Tobacco: smoked  For 30 years     Surgical History:  No past surgical history on file.  Gyn Hx:  Menses 15   G2P2- first birth 9  Menopause :50's   Menopausal status: Postmenopausal  Hormone therapy use: None      Family and Social History  No family history on file.  Social History     Tobacco Use   ? Smoking status: Former Smoker     Quit date: 1985     Years since quitting: 37.8   ? Smokeless tobacco: Former Estate agent Use Topics   ? Alcohol use: Not Currently       MEDS     No outpatient medications have been marked as taking for the 01/03/21 encounter (Appointment) with Fredda Hammed., MD.     Allergies   Allergen Reactions   ? Beta Adrenergic Blockers Other (See Comments)     Avoid d/t Myasthenia Gravis  Avoid d/t Myasthenia Gravis     ? Levofloxacin Other (See Comments)     Flare of Myasthenia Gravis   ? Macrolides And Ketolides Other (See Comments)     Avoid d/t Myasthenia Gravis  Use with caution d/t Myasthenia Gravis  Use with caution d/t Myasthenia Gravis     ? Sulfa Antibiotics Other (See Comments)     May possibly have caused deafness in one ear  May possibly have caused deafness in one ear  other     ? Botulinum Toxins Other (See Comments)     Muscle weakness r/t Myasthenia Gravis  Muscle weakness r/t Myasthenia Gravis     ? Statins Other (See Comments)     Muscle aches  Muscle aches  Muscle aches     ? Plastic Tape Itching and Other (See Comments)     Turns the skin red where applied       PHYSICAL EXAM     There were no vitals filed for this visit.   There is no height or weight on file to calculate BMI.  Wt Readings from Last 3 Encounters:   12/20/20 70.2 kg (154 lb 12.8 oz)   11/25/20 65.8 kg (145 lb)   11/03/20 66.6 kg (146 lb 12.8 oz)       Review of Systems:  Pertinent items are noted in HPI.      Physical Exam:  General: No acute distress. Appears well-developed, well-nourished and close to stated age.   Head: Normocephalic, atraumatic.  Eyes: Sclera anicteric. EOMI.  ENT: Hearing grossly normal bilaterally. Oropharynx is clear, mucus membranes are moist.  No oral ulcers noted. Good dentition.  Neck: Supple. Trachea midline.   Cardiac: Regular rate and rhythm. Normal S1, S2. No murmurs, rubs, or gallops.  Respiratory: Clear to auscultation bilaterally. No wheezes, rales, or rhonchi noted. Respiratory effort appears normal.   Abdomen: Soft, nontender and nondistended. Bowel sounds are present and normoactive. No organomegaly is appreciated.  Musculoskeletal: No edema. No cyanosis. Extremities are warm and well-perfused.   Extremities: Warm. No edema. No cyanosis.  Neurologic: Gait appears normal. Sensation intact to light touch in all four extremities. Oriented to person, place and time.   Hematologic: No bruising, purpura or petechiae are noted.   Dermatologic: Skin intact.  No rashes appreciated.   Lymphatic: No palpable cervical, supraclavicular, axillary or inguinal adenopathy appreciated.   Psychiatric: Affect appropriate.  Pleasant and conversant.   Breast exam: no breast masses or axillary adenopathy noted     Recent Labs:  Lab Results   Component Value Date    WBC 6.40 12/20/2020    HGB 9.7 (L) 12/20/2020  HCT 28.7 (L) 12/20/2020    MCV 98.6 12/20/2020    PLT 278 12/20/2020      Lab Results   Component Value Date    NA 139 12/20/2020    K 3.9 12/20/2020    CL 104 12/20/2020    CO2 28 12/20/2020    CREAT 1.16 12/20/2020    BUN 18 12/20/2020     Lab Results   Component Value Date    ALT 11 12/18/2020    AST 28 12/18/2020    ALKPHOS 76 12/18/2020    BILITOT 0.4 12/18/2020     Lab Results   Component Value Date CALCIUM 8.5 (L) 12/20/2020       Pertinent imaging:  12/18/2020 CT abd/pelvis wo contrast  1.  No acute CT abnormality in the abdomen or pelvis.  2.  Moderate hiatal hernia with postsurgical changes likely related to reported prior Nissen fundoplication.  3.  Left-sided diverticulosis without evidence diverticulitis.  4.  History of bilateral breast cancer without evidence of metastatic disease within the abdomen or pelvis.    12/18/2020 CTA chest   1.  No acute aortic process. No acute process within the chest.  2.  History of bilateral breast cancer without evidence of intrathoracic metastatic disease.  3.  Additional findings as above.    11/18/2020 MRI breast bilat  1. Right breast:   *  Stable to slightly decreased size of known biopsy proven malignancies in the right breast. BI-RADS CATEGORY 6 - KNOWN BIOPSY-PROVEN MALIGNANCY. Recommend continued surgical and oncologic management.   ?  2. Left breast:  *  Decreased size of the irregular mass with microclip at 11:00, 5 cm from the nipple, consistent with known malignancy. BI-RADS CATEGORY 6 - KNOWN BIOPSY-PROVEN MALIGNANCY. Recommend continued surgical and oncologic management.   *  Nonmass enhancement spanning 40 mm has also slightly decreased in size and now demonstrates subthreshold kinetics. This remains suspicious. If breast conservation is desired, an MRI guided biopsy is recommended. BI-RADS CATEGORY 4 - SUSPICIOUS.  *  Previously described oval enhancing mass at 4:00 is no longer identified. BI-RADS CATEGORY 2.  ?  OVERALL ASSESSMENT: BI-RADS CATEGORY 4 - SUSPICIOUS.    08/26/2020 DXA lumbar spine/hip  -----------------------------------------------------------------   Region ? ? ? ? ? ? ? ? ? BMD ? ?T-score ?Z-score ? Classification   -----------------------------------------------------------------   AP Spine(L1-L4) ? ? ? ? ?1.062 ? ?0.1 ? ? ?2.9 ? ? ? Normal   Femoral Neck (Left) ? ? ?0.723 ? -1.1 ? ? ?1.3 ? ? ? Osteopenia   Total Hip (Left) ? ? ? ? 0.725 ? -1.8 ? ? ?0.4 ? ? ? Osteopenia   -----------------------------------------------------------------   Impression: The BMD for  the AP Spine(L1-L4) decreased, changing by -2.4% since the last DXA exam.     08/22/2020 PETCT  1.  Mild to moderate left greater than right FDG activity associated with small soft tissue densities in the bilateral breasts in the regions marked by breast biopsy clips.   2.  No evidence of FDG avid metastatic disease.    06/19/2019 US pelvis  Small subserosal uterine fundal fibroid. Otherwise normal postmenopausal pelvic ultrasound.        Pertinent pathology:  08/02/2020 [facility] genetics      07/13/2020 Tissue exam  A. BREAST, RIGHT, MASS, 12:00, 6 CM FROM NIPPLE (NEEDLE CORE BIOPSY):  - Invasive ductal carcinoma with lobular and focal cribriform features, grade 2 (50% of biopsy; longest segment 4 mm)  Modified Bloom and Richardson score: 6 of 9                        Tubule formation:                3                        Nuclear pleomorphism:       2                        Mitotic score:                       1  - Ductal carcinoma in situ (DCIS), low nuclear grade, cribriform type with focal central necrosis  - Breast biomarkers: See below  - ERBB2 (HER2) FISH: Negative for HER2 gene amplification   ?  B.  BREAST, LEFT, MASS, 11:00, 5 CM FROM NIPPLE (NEEDLE CORE BIOPSY):  - Invasive ductal carcinoma with lobular features, grade 2 (50% of biopsy; longest involved segment 6 mm)             Modified Bloom and Richardson score: 6 of 9                        Tubule formation:                3                        Nuclear pleomorphism:       2                        Mitotic score:                       1  - Ductal carcinoma in situ (DCIS) is not identified  - Breast biomarkers: See immunohistochemistry report below  - ERBB2 (HER2) FISH: Negative [based on IHC (2+) and FISH, see comment].     COMMENT: It is uncertain whether patients with an average of >4.0 and <6.0 HER2?signals per cell and a HER2/CEP17 ratio of <2.0 benefit from HER2-targeted therapy in the absence of protein overexpression (IHC 3+). If the specimen test result is close to the [facility] ratio threshold for positive, there is a higher likelihood that repeat testing will result in different results by chance alone. Therefore, when [facility] results are not 3+ positive, it is recommended that the sample be considered negative without additional testing on the specimen.    BREAST BIOMARKER REPORT  ?  BLOCK:  A1  FIXATIVE:  Formalin   ?  RESULT ER/PR:  ? ESTROGEN   RECEPTOR PROGESTERONE RECEPTOR   Antibody Clone SP1 Clone 636   %Tumor Staining >95% >95%   Intensity (1+ to 3+) 3+ 3+   ?  RESULT HER-2/neu IHC assay (utilizing FDA-approved DAKO Hercep Test): Arch Pathol Lab Med [phone], ASCO/CAP HER2 Testing in Breast Cancer Update - Veronia Beets al  ?  Test Score: 1+  ?  HER2/neu:  Negative  ?  RESULT Ki-67 (Clone MIB1):  5%  ?  BREAST BIOMARKER REPORT  ?  BLOCK:  B1  FIXATIVE:  Formalin   ?  RESULT ER/PR:  ? ESTROGEN   RECEPTOR PROGESTERONE RECEPTOR   Antibody Clone SP1 Clone 636   %  Tumor Staining >95% 0%   Intensity (1+ to 3+) 3+ NA   ?  RESULT HER-2/neu IHC assay (utilizing FDA-approved DAKO Hercep Test): Arch Pathol Lab Med [phone], ASCO/CAP HER2 Testing in Breast Cancer Update - Veronia Beets al  ?  Test Score: 2+  ?  HER2/neu:  Equivocal  ?  RESULT Ki-67 (Clone MIB1):  10%    07/19/2017 Outside pathology        ASSESSMENT & PLAN   Maxie Debose is a 85 y.o. female presents for     1. 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 She elected not to have surgery at that time. She took tamoxifen-->anastrozole for a short period of time, but discontinued endocrine therapy due to side effects.   - R breast biopsy at 12:00, 6 cm from nipple on 07/13/2020 revealed invasive ductal carcinoma with lobular and focal cribriform features, grade 2 ER (+) PR (+) HER2 (-), Ki-67 5%  - L breast biopsy at 11:00, 5 cm from nipple on 07/13/2020 revealed invasive ductal carcinoma with lobular features, grade 2 ER (+) PR (-) HER2 (-), Ki-67 10%  - We had a long discussion about biology and treatment of breast cancer, with emphasis that cure can only be attained with multimodality treatments such as surgery/ +/- radiation and systemic ( endocrine) therapy .  We have discussed that given her concern for anesthesia with Myasthenia Gravis, we cat try neoadjuvant endocrine therapy with close observation of side effects.  - We will start letrozole 2.5 mg po daily 08/17/2020 --> 10/28/2020 discontinued due to diarrhea  - tumors are not palpable on exam, and she would have to be followed with imaging   -  PETCT on 08/22/2020 with no evidence of FDG avid metastatic disease.   - Arimidex 10/28/2020 --->   - Breast MRI on 11/18/2020 with stable to slightly decreased size of known biopsy proven malignancies in the right breast, decreased size of the irregular mass with microclip at 11:00, 5 cm from the nipple, consistent with known malignancy, nonmass enhancement spanning 40 mm has also slightly decreased in size and now demonstrates subthreshold kinetics, this remains suspicious, previously described oval enhancing mass at 4:00 is no longer identified.     01/03/2021   - CT CAP on 12/18/2020 without evidence of metastatic disease  - Continue Arimidex 1 mg po daily   - Follow-up after seeing Breast Surgery, Dr. Janee Morn      2.  Ear pain / fullness - in her left ear - resolved   - Augmentin 875 bid  - notified Dr. Brayton El, since no exam      3.  Myasthenia gravis   - F/u w Dr. Claudie Leach, Neurology, on 08/17/2020. No contraindication to possible surgery for DCIS. If she is to require surgery, we would likely ensure she has an IVIG infusion within 1 week prior to the procedure. EMG/NCS on 09/19/20 at 2:30 PM. To evaluate numbness and tingling in hands. Discontinue Mestinon (TID PRN; taking once daily recently). Monitor for improvement in hand cramps. Continue Azathioprine 75 mg BID (refilling today). Continue IVIG 60g (1g/kg) every 3 weeks ([place])     4.  Diarrhea  - Imodium 4 mg after each loose stool  - Metamucil     5. Bone density  - DXA scan on 08/26/2020 demonstrated osteopenia of the left femoral  neck and total hip.      6. Chest pain  - Hospital admission 10/23-10/25/2022. P/w?atypical?chest pain x 2-3 days?with new LBBB but negative troponin and relief w/ antacid medications most c/f upper GI pathology such as severe GERD, PUD, or gastritis.?CT AP without acute abnormality. Initiated pantoprazole 40 mg PO bid with improvement in symptoms with PPI and GI cocktail w/maalox. Patient able to tolerate all meals without recurrent chest pain. GI was consulted and given improvement in symptoms, EGD deferred. Continue?Maalox q4h PRN. H pylori stool Ag discontinued given long term PPI use. Continue dexelant outpatient. Ondansetron?4 mg PO q6hr PRN Rx for nausea.   - Outpatient GI follow-up scheduled w Dr. Riley Kill on 01/12/2021          DIAGNOSES/ORDERS addressed on 01/03/2021:  1. Breast cancer (HCC/RAF)      - on Arimidex - continue close monitoring for toxicity of intensive drug therapy.    NEW PROBLEMS AS OF 01/03/2021:     []  New minor or self-limited problem (99202/99212).  []  2+ minor or self-limited problem, stable chronic illness, acute uncomplicated illness/injury (99203/99213).  []  Chronic illness with exacerbation/progression/tx side effect; 2+ stable chronic illnesses, new problem with uncertain prognosis, acute illness with systemic symptoms, or acute complicated injury (99204/99214).  []  Chronic illness with severe exacerbation/progression/tx side effect, acute/chronic illness or injury that poses threat to life or bodily function (99205/99215).      REVIEW OF DATA   I have  [x]  Reviewed/ordered []  1 []  2 [x]  ? 3 unique laboratory, radiology, and/or diagnostic tests noted below  No orders of the defined types were placed in this encounter.    12/18/2020 CT abd/pelvis wo contrast  12/18/2020 CTA chest   12/20/2020 CBC, BMP    [x]  Reviewed []  1 []  2 [x]  ? 3 prior external notes and incorporated into patient assessment   I reviewed Dr. Velvet Bathe, MD in Surgery, Surgical Oncology on 08/10/2020.  I reviewed Dr. York Ram, MD in Medicine, Internal Medicine on 07/13/2020.  I reviewed Dr. Johnney Killian. Desma Maxim, MD in Obstetrics & Gynecology on 05/07/2019.    []  Discussed management or test interpretation with external provider(s) on 01/03/2021 as noted:    RISK OF COMPLICATION   This writer has deemed the above diagnoses to have a risk of complication, morbidity or mortality of:   []  Minimal  []  Low  []  Moderate   []  due to prescription drug management   [] Decision re: minor surgery   [] Diagnosis or treatment limited by social determinants of health  []  Severe    []  due to intensive monitoring for chemotherapy toxicity   []  Decision elective surgery with risk factors  []  Decision regarding need for hospitalization  []  Advanced Care Planning decisions    SOCIAL DETERMINANTS OF HEALTH   []  The diagnosis or treatment of said conditions is significantly limited by social determinants of health:      I spent 30 minutes counseling and coordinating care for the items checked below on the day of service 01/03/2021  [x]  Preparing to see the patient (e.g., review of tests)  [x]  Obtaining and or reviewing separately obtained history   [x]  Performing a medically appropriate examination and/or evaluation   [x]  Counseling and educating the patient/family/caregiver   [x]  Ordering medications, tests, or procedures  []  Referring and communicating with other healthcare professionals (when not separately reported)  [x]  Documenting clinical information in the EHR  []  Independently interpreting results and communicating results to patient/ family/caregiver  No follow-ups on file.       The above recommendation were discussed with the patient. The patient has all questions answered satisfactorily and is in agreement with this recommended plan of care.    Scribe Signature:  I, Salley Scarlet, have scribed for [employer]. Oscar La, MD on this encounter with [name] at the [facility] in [place] on 01/03/2021 at 2:56 PM 01/03/2021.     Physician Signature:  I have reviewed this note, scribed by Salley Scarlet, I have edited and attest that it is an accurate representation of my evaluation.   Rhona Leavens. Oscar La, MD 01/03/2021 at 2:56 PM

## 2020-12-28 ENCOUNTER — Telehealth: Payer: BLUE CROSS/BLUE SHIELD

## 2020-12-28 ENCOUNTER — Telehealth: Payer: BLUE CROSS/BLUE SHIELD | Attending: Geriatric Medicine

## 2020-12-28 NOTE — Telephone Encounter
Called patient and left a detailed VM advising to call back to scheduled a f/u appt with Dr. Jefm Bryant ([facility]).

## 2020-12-28 NOTE — Progress Notes
[name] is a 85 y.o. female here for a post-discharge visit, which is within the 30 day Transitional Care Management period.     Today's face to face visit occurred within: []  7 days of discharge   [x]  14 days of discharge.     Hospital records reviewed today including: admitting history and physical; discharge summary; diagnostic studies; imaging studies and home medication reconciliation.     Hospital discharge facility: [x]  [facility]  []  [facility]  []  Other:   Admission date: *Oct 23**  Discharge date: *Oct 25  Discharge diagnosis: *    ? Acute medial meniscal tear, left, subsequent encounter   ? Angioedema   ? Anxiety   ? Asthmatic bronchitis, mild persistent, with acute exacerbation   ? Chronic cholecystitis   ? Chronic interstitial cystitis   ? CKD (chronic kidney disease), stage III (HCC/RAF)   ? COPD mixed type (HCC/RAF)   ? Major depression, recurrent, chronic (HCC/RAF)   ? Diabetes mellitus (HCC/RAF)   ? Esophageal reflux   ? Eustachian tube dysfunction   ? Fibromyalgia   ? H/O arthroscopy   ? Insomnia   ? Myasthenia gravis with exacerbation (HCC/RAF)   ? Nocturnal hypoxemia   ? Osteoarthritis of knee   ? Renal cyst   ? Seasonal and perennial allergic rhinitis   ? Spondylosis without myelopathy or radiculopathy, lumbar region   ? Urethral caruncle   ? Vaginal atrophy   ? Benzodiazepine dependence (HCC/RAF)   ? Breast cancer (HCC/RAF)   ? Refractive error   ? Ptosis of eyelid   ? Pseudophakia of both eyes   ? Aortic calcification (HCC/RAF)   ? HTN (hypertension)   ? Immunosuppressed status (HCC/RAF)   ? Acute chest pain     **  Date of interactive contact with patient: **Oct 26*. Done by my clinic's ''Clinical Care Coordinator, '' Izraelyan   Face to Face Visit: 12/28/2020     Patient transitioned back to: [x]  home  []  assisted living facility  []  board and care     []  Education provided to patient and/or caregiver/family on home safety, support of ADLs, and support of independent living  [x]  Current home support is adequate  []  Medications from hospital and/or SNF discharge reviewed and questions answered  []  Patient is compliant with post-discharge medications  []  Home Health Services are being provided by ** * which includes physical therapy and skilled nursing. Communication with home health agency has been/will be made     Greater than 50% of today's 40 minute visit was spent counseling the patient regarding the above medical conditions, treatment options and management of expectations and in coordination of care with staff.  The patient and/or caregiver had all questions answered and expressed understanding of the plan.      Nebulized meds  Cardiology--stress test

## 2021-01-01 DIAGNOSIS — R0602 Shortness of breath: Secondary | ICD-10-CM

## 2021-01-03 ENCOUNTER — Ambulatory Visit: Payer: BLUE CROSS/BLUE SHIELD | Attending: Hematology & Oncology

## 2021-01-09 ENCOUNTER — Telehealth: Payer: MEDICARE

## 2021-01-09 NOTE — Telephone Encounter
Call Back Request      Reason for call back: Gabby from [facility] calling to clarify frequency of IVIG  Was every 2 weeks and most recent orders say every 3 weeks  Best call back number is [phone] ext [phone]    Any Symptoms:  []  Yes  [x]  No       If yes, what symptoms are you experiencing:    o Duration of symptoms (how long):    o Have you taken medication for symptoms (OTC or Rx):      If call was taken outside of clinic hours:    [] Patient or caller has been notified that this message was sent outside of normal clinic hours.     [] Patient or caller has been warm transferred to the physician's answering service. If applicable, patient or caller informed to please call us back if symptoms progress.  Patient or caller has been notified of the turnaround time of 1-2 business day(s).

## 2021-01-10 ENCOUNTER — Ambulatory Visit: Payer: BLUE CROSS/BLUE SHIELD

## 2021-01-10 DIAGNOSIS — I10 Essential (primary) hypertension: Secondary | ICD-10-CM

## 2021-01-10 DIAGNOSIS — R0609 Other forms of dyspnea: Secondary | ICD-10-CM

## 2021-01-10 NOTE — Patient Instructions
Increase Losartan to 50 mg per day given high blood pressure

## 2021-01-10 NOTE — Consults
CARDIOLOGY NOTE      PATIENT: [name]   MRN: [id number]   DOB: 11/08/1935   DATE OF SERVICE: 01/10/2021    PCP:  York Ram., MD      Problem List Items Addressed This Visit     Aortic calcification (HCC/RAF)   Other Visit Diagnoses     DOE (dyspnea on exertion)    -  Primary    Essential hypertension                HPI:     This is a 85 y.o. female who is here for cardiac evaluation.  Indication:   SOB    The patient is a/w friend, [name]    Pt was recently hospitalized with SOB and chest pain.   Pt ambulates with a walker for precautionary reasons given history of falls.   With activity, the patient reports SOB/dypnea. She walks the dog 1/2 block  Her symptoms have been ongoing for the past 3 years, progressive worsening.   The patient notes allergies and asthma.   About 1 year ago, she could walk 1.5 blocks        Review of system: 14 point review of system is otherwise unremarkable    Past Medical History:   Diagnosis Date   ? Breast cancer (HCC/RAF) 12/20/2018       No past surgical history on file.    Allergies:   Allergies   Allergen Reactions   ? Beta Adrenergic Blockers Other (See Comments)     Avoid d/t Myasthenia Gravis  Avoid d/t Myasthenia Gravis     ? Levofloxacin Other (See Comments)     Flare of Myasthenia Gravis   ? Macrolides And Ketolides Other (See Comments)     Avoid d/t Myasthenia Gravis  Use with caution d/t Myasthenia Gravis  Use with caution d/t Myasthenia Gravis     ? Sulfa Antibiotics Other (See Comments)     May possibly have caused deafness in one ear  May possibly have caused deafness in one ear  other     ? Botulinum Toxins Other (See Comments)     Muscle weakness r/t Myasthenia Gravis  Muscle weakness r/t Myasthenia Gravis     ? Statins Other (See Comments)     Muscle aches  Muscle aches  Muscle aches     ? Plastic Tape Itching and Other (See Comments)     Turns the skin red where applied       Social History     Socioeconomic History   ? Marital status: Widowed   Tobacco Use   ? Smoking status: Former     Types: Cigarettes     Quit date: 1985     Years since quitting: 37.8   ? Smokeless tobacco: Former   Substance and Sexual Activity   ? Alcohol use: Not Currently   ? Drug use: Never       No family history on file.  Adopted     Objective:     BP (!) 182/77  ~ Pulse (!) 113  ~ Temp 37.2 ?C (99 ?F) (Tympanic)  ~ Wt 148 lb (67.1 kg)  ~ SpO2 93%  ~ BMI 24.63 kg/m?     Vitals:    01/10/21 1519   BP: (!) 182/77   Pulse: (!) 113   Temp: 37.2 ?C (99 ?F)   TempSrc: Tympanic   SpO2: 93%   Weight: 148 lb (67.1 kg)  Body mass index is 24.63 kg/m?Marland Kitchen    Wt Readings from Last 3 Encounters:   01/10/21 148 lb (67.1 kg)   01/03/21 148 lb 12.8 oz (67.5 kg)   12/20/20 154 lb 12.8 oz (70.2 kg)          Physical Exam:  General Exam: The patient is well-developed, well-nourished and in no apparent distress.   Neurologic/Psychiatric: The patient is alert and oriented. Patient's affect is appropriate.  HEENT: Head is normocephalic, atraumatic. Neck is supple with full range of motion. JVP is flat. Sclerae are anicteric.  Oral mucosa is clear. There is no epistaxis.  Cardiac: Heart is regular in rate and rhythm. There is no murmur.   Vascular: There are no carotid bruits. Radial pulses are 2+ and symmetric.    Respiratory: Breathing is non-labored. There is no use of accessory muscles.  Lungs are clear to auscultation bilaterally without wheeze, crackles, or rhonchi.  Abdomen: Bowel sounds are present. Abdomen is soft, nontender and nondistended.    Extremities: No edema. There is no cyanosis or clubbing.  Integument: There is no visible petechiae.  Back: No costovertebral angle tenderness.      Laboratory:  Lab Results   Component Value Date    NA 139 12/20/2020    K 3.9 12/20/2020    CL 104 12/20/2020    CO2 28 12/20/2020    BUN 18 12/20/2020    CREAT 1.16 12/20/2020    GLUCOSE 98 12/20/2020    CALCIUM 8.5 (L) 12/20/2020     Lab Results   Component Value Date    TROPONIN 0.05 (H) 12/19/2020 TROPONIN 0.07 (H) 12/19/2020    TROPONIN 0.06 (H) 12/18/2020     Lab Results   Component Value Date    BNP 92 09/22/2019     Lab Results   Component Value Date    WBC 6.40 12/20/2020    HGB 9.7 (L) 12/20/2020    HCT 28.7 (L) 12/20/2020    MCV 98.6 12/20/2020    PLT 278 12/20/2020     No results found for: CHOL, CHOLHDL, CHOLDLCAL, CHOLDLQ, TRIGLY  Lab Results   Component Value Date    PT 12.9 12/18/2020    INR 1.0 12/18/2020     No components found for: HA1C  Lab Results   Component Value Date    TSH 2.8 03/23/2020       Patient Active Problem List   Diagnosis   ? Acute medial meniscal tear, left, subsequent encounter   ? Angioedema   ? Anxiety   ? Asthmatic bronchitis, mild persistent, with acute exacerbation   ? Chronic cholecystitis   ? Chronic interstitial cystitis   ? CKD (chronic kidney disease), stage III (HCC/RAF)   ? COPD mixed type (HCC/RAF)   ? Major depression, recurrent, chronic (HCC/RAF)   ? Diabetes mellitus (HCC/RAF)   ? Esophageal reflux   ? Eustachian tube dysfunction   ? Fibromyalgia   ? H/O arthroscopy   ? Insomnia   ? Myasthenia gravis with exacerbation (HCC/RAF)   ? Nocturnal hypoxemia   ? Osteoarthritis of knee   ? Renal cyst   ? Seasonal and perennial allergic rhinitis   ? Spondylosis without myelopathy or radiculopathy, lumbar region   ? Urethral caruncle   ? Vaginal atrophy   ? Benzodiazepine dependence (HCC/RAF)   ? Breast cancer (HCC/RAF)   ? Refractive error   ? Ptosis of eyelid   ? Pseudophakia of both eyes   ? Aortic calcification (HCC/RAF)   ?  HTN (hypertension)   ? Immunosuppressed status (HCC/RAF)   ? Acute chest pain   ? Atypical chest pain       Outpatient Medications Prior to Visit   Medication Sig   ? Albuterol Sulfate AEPB Takes 1 to 2 puffs every 4 hours as needed for wheezing or SOB Inhale Takes 1 to 2 puffs every 4 hours as needed for wheezing or SOB ..   ? anastrozole 1 mg tablet Take 1 tablet (1 mg total) by mouth daily.   ? ascorbic acid 500 mg tablet Take 1 tablet (500 mg total) by mouth two (2) times daily with meals.   ? azaTHIOprine 50 mg tablet Take one and one-half tablet (75 mg total) by mouth two (2) times daily.   ? buPROPion, SR, (WELLBUTRIN SR) 200 mg 12 hr tablet Take 1 tablet (200 mg total) by mouth two (2) times daily.   ? clonazePAM 1 mg tablet 0.5 mg in am and 1 mg in pm.   ? DEXILANT 60 MG DR capsule Take 60 mg by mouth daily.   ? DULoxetine 20 mg DR capsule Take 3 capsules (60 mg total) by mouth daily.   ? estradiol (ESTRACE) 0.1 mg/g vaginal cream Place 1 g vaginally three (3) times a week.   ? fluticasone-vilanterol (BREO ELLIPTA) 100-25 mcg/inh inhaler Inhale 1 puff daily.   ? losartan 25 mg tablet Take 1 tablet (25 mg total) by mouth daily.   ? meclizine 25 mg tablet Take 1 tablet (25 mg total) by mouth three (3) times daily as needed for Dizziness.   ? methenamine hippurate 1 g tablet Take 1 tablet (1 g total) by mouth two (2) times daily.   ? montelukast 10 mg tablet Take 10 mg by mouth as needed for.   ? pyridostigmine 60 mg tablet Take 1 tablet (60 mg total) by mouth three (3) times daily as needed.   ? trimethoprim 100 mg tablet Take 1 tablet (100 mg total) by mouth daily.   ? clonazePAM 0.5 mg tablet .25 in am and 0.5 in pm.   ? ondansetron ODT 4 mg disintegrating tablet Take 1 tablet (4 mg total) by mouth every six (6) hours as needed for Nausea or Vomiting.     Facility-Administered Medications Prior to Visit   Medication   ? lidocaine 2% inj 5 mL   ? lidocaine 2% inj 5 mL   ? lidocaine-EPINEPHrine 1 %-1:100000 inj 4.8 mL   ? lidocaine-EPINEPHrine 1 %-1:100000 inj 5 mL       Current Outpatient Medications   Medication Sig   ? Albuterol Sulfate AEPB Takes 1 to 2 puffs every 4 hours as needed for wheezing or SOB Inhale Takes 1 to 2 puffs every 4 hours as needed for wheezing or SOB ..   ? anastrozole 1 mg tablet Take 1 tablet (1 mg total) by mouth daily.   ? ascorbic acid 500 mg tablet Take 1 tablet (500 mg total) by mouth two (2) times daily with meals. ? azaTHIOprine 50 mg tablet Take one and one-half tablet (75 mg total) by mouth two (2) times daily.   ? buPROPion, SR, (WELLBUTRIN SR) 200 mg 12 hr tablet Take 1 tablet (200 mg total) by mouth two (2) times daily.   ? clonazePAM 1 mg tablet 0.5 mg in am and 1 mg in pm.   ? DEXILANT 60 MG DR capsule Take 60 mg by mouth daily.   ? DULoxetine 20 mg DR capsule Take 3  capsules (60 mg total) by mouth daily.   ? estradiol (ESTRACE) 0.1 mg/g vaginal cream Place 1 g vaginally three (3) times a week.   ? fluticasone-vilanterol (BREO ELLIPTA) 100-25 mcg/inh inhaler Inhale 1 puff daily.   ? losartan 25 mg tablet Take 1 tablet (25 mg total) by mouth daily.   ? meclizine 25 mg tablet Take 1 tablet (25 mg total) by mouth three (3) times daily as needed for Dizziness.   ? methenamine hippurate 1 g tablet Take 1 tablet (1 g total) by mouth two (2) times daily.   ? montelukast 10 mg tablet Take 10 mg by mouth as needed for.   ? pyridostigmine 60 mg tablet Take 1 tablet (60 mg total) by mouth three (3) times daily as needed.   ? trimethoprim 100 mg tablet Take 1 tablet (100 mg total) by mouth daily.   ? clonazePAM 0.5 mg tablet .25 in am and 0.5 in pm.   ? ondansetron ODT 4 mg disintegrating tablet Take 1 tablet (4 mg total) by mouth every six (6) hours as needed for Nausea or Vomiting.     No current facility-administered medications for this visit.     Facility-Administered Medications Ordered in Other Visits   Medication Dose Route Frequency   ? lidocaine 2% inj 5 mL  5 mL Intradermal Once   ? lidocaine 2% inj 5 mL  5 mL Intradermal Once   ? lidocaine-EPINEPHrine 1 %-1:100000 inj 4.8 mL  4.8 mL Intradermal Once   ? lidocaine-EPINEPHrine 1 %-1:100000 inj 5 mL  5 mL Intradermal Once             2018 ACC/AHA guidelines recommends high-intensity statin because of ASCVD diagnosis. Patient is intolerant to statin. Consider non-statin Rx or referral.  10-Year ASCVD risk calculator does not apply because patients with ASCVD are recommended to take a high-intensity statin.  as of 3:54 PM on 01/10/2021.  10-Year ASCVD risk with optimal risk factors cannot be calculated.  Values used to calculate ASCVD score:  Age: 85 y.o. Cannot calculate risk because age is not between 99 and 43 years old.  Gender: Female Race: White: Not Listed  Cannot calculate risk because HDL cholesterol not documented within the past 5 years.    Cannot calculate risk because Total cholesterol not documented within the past 5 years.    LDL cholesterol not documented within the past 5 years.    Systolic BP: 182 mm Hg. BP was measured on 01/10/2021.  The patient is being treated with a medication that influences SBP.  The patient is currently not a smoker.  The patient has a diagnosis of diabetes.  Click here for the [facility] ASCVD Cardiovascular Risk Estimator Plus tool Office manager).      Assessment:     [name] is 85 y.o. female with    SOB/dyspnea. Uses oxygen concentrator  Abnormal EKG with LBBB and PACs  Former tobacco use  Subtle calcifications of the aortic root. Moderately calcified thoracic aorta and its main branch vessels.      Plan:     Increase Losartan to 50 mg  Echo and Myoview    Orders Placed This Encounter   ? NM Myocardial Rest and Stress Pharmacologic Perfusion SPECT   ? ECG 12-Lead Clinic Performed   ? Echo adult transthoracic complete           Return in about 4 weeks (around 02/07/2021).      Patient Instructions   Increase Losartan to 50  mg per day given high blood pressure      Future Appointments   Date Time Provider Department Center   01/12/2021  3:30 PM Matthew Saras., MD GASTRO 2020 MEDICINE   01/24/2021  4:40 PM York Ram., MD GERI IMS 420 MEDICINE   02/14/2021  1:20 PM Fredda Hammed., MD HEM/ONC 230 MEDICINE         Author: Clelia Croft, MD

## 2021-01-10 NOTE — Telephone Encounter
Spoke with [name] at [facility] and informed her per last order for patient to received IVIG every 2 - 3 weeks depending on pt is feeling.     No further action needed.

## 2021-01-11 ENCOUNTER — Ambulatory Visit: Payer: BLUE CROSS/BLUE SHIELD

## 2021-01-11 NOTE — Telephone Encounter
Faxed clinical notes to [facility] for continuation of IVIG treatment. Fax# [phone], received confirmation.

## 2021-01-12 ENCOUNTER — Non-Acute Institutional Stay: Payer: BLUE CROSS/BLUE SHIELD | Attending: Gastroenterology

## 2021-01-13 ENCOUNTER — Telehealth: Payer: BLUE CROSS/BLUE SHIELD

## 2021-01-13 NOTE — Telephone Encounter
Call Back Request      Reason for call back: Patient calling to schedule follow up appointment with Dr. Jefm Bryant.     Please advise, thank you.    [facility] [phone]     Any Symptoms:  []  Yes  [x]  No       If yes, what symptoms are you experiencing:    o Duration of symptoms (how long):    o Have you taken medication for symptoms (OTC or Rx):      If call was taken outside of clinic hours:    [] Patient or caller has been notified that this message was sent outside of normal clinic hours.     [] Patient or caller has been warm transferred to the physician's answering service. If applicable, patient or caller informed to please call us back if symptoms progress.  Patient or caller has been notified of the turnaround time of 1-2 business day(s).

## 2021-01-16 NOTE — Telephone Encounter
PDL Call to Clinic    Reason for Call:  Pt is returning call from office in regards to encounter below     Appointment Related?  [x]  Yes  []  No     If yes;  Date:TBD  Time:    Call warm transferred to PDL: [x]  Yes  []  No    Call Received by Clinic Representative:  Reggie  If call not answered/not accepted, call received by Patient Services Representative:

## 2021-01-16 NOTE — Telephone Encounter
In regards to encounter below, pt is scheduled.

## 2021-01-16 NOTE — Telephone Encounter
Called patient, no answer.  Left detailed voicemail offering an appointment with Dr. Jefm Bryant ([facility]).    If patient calls please offer next available.

## 2021-01-24 ENCOUNTER — Ambulatory Visit: Payer: BLUE CROSS/BLUE SHIELD | Attending: Geriatric Medicine

## 2021-01-24 ENCOUNTER — Telehealth: Payer: BLUE CROSS/BLUE SHIELD

## 2021-01-24 NOTE — Telephone Encounter
Referral Request    1) Patient is requesting a referral to:  Breast Surgeon      Specific location? [facility]   Particular MD in mind? Dr. Linus Orn    2) The issue (diagnosis, symptoms):     3) Has patient been seen by their doctor for this issue? yes     4) Was an appointment offered? no    5) Patient's last office visit: 01/03/21    Please advise pt once done [phone]    Thank you.    Patient or caller has been notified of the turnaround time of 1-2 business day(s).

## 2021-01-24 NOTE — Progress Notes
OUTPATIENT GERIATRICS CLINIC NOTE    PATIENT:  [name]   MRN:  [id number]  DOB:  [birth year]  DATE OF SERVICE:  01/24/2021  PRIMARY CARE PHYSICIAN: York Ram., MD    CHIEF COMPLAINT:   No chief complaint on file.      [facility]:  [employer]    [facility]:    Chaperone status:  No data recorded    HISTORY OF PRESENT ILLNESS     [name] is a 85 y.o. female who presents today for *follow up**. Patient is accompanied by *no one**. History today is per the patient,and review of available recent records in [facility] and Care Everywhere.     Patient is a(n) [x]  reliable []  unreliable historian and additional collateral information was obtained during the visit today from:       [facility]    Per chart review, pertinent medical history:  Past Medical History:   Diagnosis Date   ? Breast cancer (HCC/RAF) 12/20/2018     No past surgical history on file.    ALLERGIES     Allergies   Allergen Reactions   ? Beta Adrenergic Blockers Other (See Comments)     Avoid d/t Myasthenia Gravis  Avoid d/t Myasthenia Gravis     ? Levofloxacin Other (See Comments)     Flare of Myasthenia Gravis   ? Macrolides And Ketolides Other (See Comments)     Avoid d/t Myasthenia Gravis  Use with caution d/t Myasthenia Gravis  Use with caution d/t Myasthenia Gravis     ? Sulfa Antibiotics Other (See Comments)     May possibly have caused deafness in one ear  May possibly have caused deafness in one ear  other     ? Botulinum Toxins Other (See Comments)     Muscle weakness r/t Myasthenia Gravis  Muscle weakness r/t Myasthenia Gravis     ? Statins Other (See Comments)     Muscle aches  Muscle aches  Muscle aches     ? Plastic Tape Itching and Other (See Comments)     Turns the skin red where applied        MEDICATIONS     Personally reviewed.    No outpatient medications have been marked as taking for the 01/24/21 encounter (Appointment) with York Ram., MD.       SOCIAL HISTORY     Social History     Social History Narrative   ? Not on file        FUNCTIONAL STATUS     BADLs:  IADLs:    Ambulates with   Falls in past year:  Afraid of falling:    GERIATRIC REVIEW OF SYSTEMS     Vision:    []   glasses     []  legally blind  Hearing: []  hearing aids    Nutrition: []  normal   []  impaired  []  vegan  []  vegetarian  []  low salt  []  low-carb   Swallowing: []  impaired   Dentures:   []  yes    Depression:  []  yes   Cognition:     Incontinence: [] urine  []  fecal  []  urine and fecal      ADVANCED CARE PLANNING     Advanced directives on file: []  No  []   Yes ? Completed:     Medical DPOA on file:   []   Yes ?   []   No ? Patient designates ** * to be their surrogate medical  decision-maker.         HEALTH CARE MAINTENANCE     IMMUNIZATIONS:   Immunization History   Administered Date(s) Administered   ? COVID-19, mRNA, (Pfizer - Purple Cap) 30 mcg/0.3 mL 03/11/2019, 04/01/2019, 10/29/2019   ? COVID-19, mRNA, tris-sucrose (Pfizer - [employer]) 30 mcg/0.3 mL 06/20/2020   ? DTaP 12/31/2015   ? influenza vaccine IM quadrivalent (Fluzone Quad) MDV (29 months of age and older) 11/26/2016   ? influenza vaccine IM quadrivalent adjuvanted (FluAD Quad) (PF) SYR (72 years of age and older) 11/05/2018, 12/01/2019   ? influenza, unspecified formulation 04/01/2019, 04/01/2019, 12/01/2019, 12/01/2019, 12/01/2019, 12/01/2019, 11/25/2020   ? pneumococcal polysaccharide vaccine 23-valent (Pneumovax) 01/10/2019       Mammogram:  DXA:  PAP:  Colonoscopy:      PHYSICAL EXAM     There were no vitals taken for this visit.  Wt Readings from Last 3 Encounters:   01/10/21 148 lb (67.1 kg)   01/03/21 148 lb 12.8 oz (67.5 kg)   12/20/20 154 lb 12.8 oz (70.2 kg)         System Check if normal Positive or additional negative findings   GEN  [x]  NAD     Eyes  []  Conj/Lids []  Pupils  []  Fundi   []  Sclerae []  EOM     ENT  []  External ears   []  Otoscopy   []  Gross Hearing    []   External nose   []  Nasal mucosa   []  Lips/teeth/gums    []  Oropharynx    []  Mucus membranes Neck  [x]  Inspection/palpation    [x]  Thyroid     Resp  [x]  Effort    [x]  Auscultation       CV  []  Rhythm/rate   [x]  Murmurs   [x]  Edema   []  JVP non-elevated    Normal pulses:   []  Radial []  Femoral  []  Pedal     Breast  []  Inspection []  Palpation     GI  []  Bowel sounds    [x]  Nontender   [x]  No distension    []  No rebound or guarding   []  No masses   []  Liver/spleen    []  Rectal     GU  F:  []  External []  vaginal wall         []  Cervix     []  mucus        []  Uterus    []  Adnexa   M:  []  Scrotum []  Penis         []  Prostate     Lymph  [x]  Cervical []  supraclavicular     [x]  Axillae   []  Groin/inguinal     MSK []  Gait  []  Back     Specify site examined:    []  Inspect/palp []  ROM   []  Stability []  Strength/tone []  Used arms to push up from seated to standing position   Assistive device:  []  single point cane []  quad cane  []  FWW []  rollator walker      Skin  []  Inspection []  Palpation     Neuro  []  Alert and oriented     []  CN2-12 intact grossly   []  DTR      []  Muscle strength      []  Sensation   []  Pronator drift   []  Finger to Nose/Heel to Shin   []  Romberg     Psych  []  Insight/judgement     []  Mood/affect    []   Gross cognition            LABS/STUDIES     LABS:  Lab Results   Component Value Date    WBC 6.40 12/20/2020    WBC 10.43 (H) 12/19/2020    WBC 13.03 (H) 12/18/2020    HGB 9.7 (L) 12/20/2020    HGB 10.8 (L) 12/19/2020    HGB 11.4 (L) 12/18/2020    MCV 98.6 12/20/2020    PLT 278 12/20/2020    PLT 332 12/19/2020    PLT 333 12/18/2020     Lab Results   Component Value Date    NA 139 12/20/2020    NA 137 12/19/2020    NA 136 12/18/2020    K 3.9 12/20/2020    K 4.2 12/19/2020    K 3.8 12/18/2020    CREAT 1.16 12/20/2020    CREAT 1.12 12/19/2020    CREAT 1.12 12/18/2020    GFRESTNOAA 29 06/09/2020    GFRESTNOAA 28 05/18/2020    GFRESTNOAA 30 03/23/2020    GFRESTAA 34 06/09/2020    GFRESTAA 33 05/18/2020    GFRESTAA 35 03/23/2020    CALCIUM 8.5 (L) 12/20/2020    CALCIUM 9.3 12/19/2020    CALCIUM 9.6 12/18/2020     Lab Results   Component Value Date    ALT 11 12/18/2020    ALT 7 10/28/2020    AST 28 12/18/2020    AST 20 10/28/2020    ALKPHOS 76 12/18/2020    BILITOT 0.4 12/18/2020    ALBUMIN 3.8 (L) 12/18/2020    ALBUMIN 4.0 10/28/2020     Lab Results   Component Value Date    TSH 2.8 03/23/2020    TSH 1.00 12/17/2018     Lab Results   Component Value Date    HGBA1C 5.2 03/23/2020    HGBA1C 4.9 09/22/2019     No results found for: CHOL, CHOLDLCAL  No results found for: CHOLDLQ  No results found for: [facility]  Lab Results   Component Value Date    FE 87 07/14/2019    FERRITIN 57 07/14/2019    FOLATE 9.6 11/25/2019    TIBC 356 07/14/2019     Lab Results   Component Value Date    VITAMINB12 3,994 (H) 11/25/2019    VITAMINB12 217 (L) 09/22/2019     Lab Results   Component Value Date    BNP 92 09/22/2019     No results found for: PSATOTAL      STUDIES:    EKG  This data element was independently reviewed and interpreted by me     NSR NO ST or T changes    XR CHEST PA LAT 2V  September 22, 2019?  COMPARISON: KUB December 17, 2018  ?  History: sob  ?  FINDINGS:  ?  Lungs: Clear  Heart/aorta: Normal heart size. The thoracic aorta is mildly calcified, tortuous and ectatic.  Adenopathy: None  Pleura: No effusion  Bones and Chest wall: No acute bony or body wall  findings. Osteopenia and bony maturational changes. Right upper quadrant cholecystectomy clips stable from October 2020  ?  ?  IMPRESSION:  ?  No acute findings or abnormalities related to provided history.    ASSESSMENT and PLAN     Derrisha Foos is a 85 y.o. female who presents today for *follow up**.          #HTN--Increased nifedipine CR 60 mg daily. Continue losartan 50 mg.  BP Better.    #  CKD--stage 4--chronic--CTM--control BP as tolerated by symptoms of orthostasis and fatigue    #Aortic Calcification--noted on chest imaging     September 22, 2019?       --Not Worsening.  Chronic.  Will monitor and treat underlying risk factors with medical and lifestyle interventions as warranted by balance between benefits and burdens.  #allergic rhinitis--trial flonase  #Macrocytosis--  #Deconditioning--initiate home PT  #GERD--s/p Nisan fundoplication  #COPD--mixed--per PFT's from [name].--consider reinitiation of inhalers.  #s/p rectocele repair  #s/p cholecystectomy  #Immunosupressed status--medication related.  Chronic. CTM  #Nocturnal hypoxemia--presumably related to MG crisis but has history of COPD. ?Will order nocturnal home O2 study and consider PFT's/Pulmonary evaluation once settled down. ?May need to restart BREO inhaler.  #Myasthenia Gravis--s/p plasmapheresis x5 October 2020, no Thymoma, on Azathioprine 150 daily and mestinon 60 tid.??Saw?Dr. Enedina Finner at [facility]. ?Was considering Soliris.??Ordering IVIG and MRI.  Encourage ongoing neurology follow up.   #IBS--titrate miralax to comfort. ?Consider duloxetine substitute for buproprion.  #Interstitial cystitis--on daily Trimethoprim 100 for UTI prophylaxis. ?Using bladder instillations prn. OFF topical estrogen due to advice from neurologist re: MG.  To see Dr. Lorenz Coaster.  Suspect recent flare related to breast CA re-diagnosis.  Doubt pseudomonas bactiuria is pathogen at present.  WIll complete current meropenum.  #Major Depression--active but appears better s/p  increase Duloxetine to 40 mg daily. Referred patient to Dr. Randie Heinz previously.  #Allergic rhinitis--previously on nasal steroids.  #Breast CA--Now with bx+.  To see [facility] Friday. ?Patient declined surgery in 2019 and experienced dry mouth from Tamoxifen. ?Was considering Anastrozole.Per records from [facility]:   ''07/20/2017 Screening detected right breast calcifications and distortion. The distortion was in the upper outer quadrant: Biopsy DCIS intermediate grade with CSL; calcifications UIQ: 2.8 cm: Biopsy DCIS+ ALH +CSL; 4 cm apart, axilla negative, Tis NX stage 0''  Await MRI to clarify surgical options.  #DM with CKD--stabe 3b--based on labs from [name]. ?FOllow here. ?If BP permits, may consider ARB BUT patient with h/o angioedema so will ONLY initiate this agent with great caution.  Refer to Ophthalmology.  #OA knees--previously getting hyaluronic acid injections.  #Anxiety  #Benzodiazepine dependence--Weaning to off.  #Fibromyalgia--duloxetine as above.   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  #Statin intolerance      FOLLOW-UP     RTC     Future Appointments   Date Time Provider Department Center   01/24/2021  4:40 PM York Ram., MD GERI IMS 420 MEDICINE   02/07/2021  2:30 PM Clelia Croft, MD CRD DIS 2020 MEDICINE   02/10/2021  2:00 PM Matthew Saras., MD GASTRO 2020 MEDICINE   02/14/2021  9:00 AM [facility]U [facility] NEUMUSC B200 NEUROLOGY   02/14/2021  1:20 PM Fredda Hammed., MD HEM/ONC 230 MEDICINE         The above plan of care, diagnosis, orders, and follow-up were discussed with the patient and/or surrogate. Questions related to this recommended plan of care were answered.    ANALYSIS OF DATA (Needs to meet 1 category for moderate and 2 categories for high LOS)     I have:     Category 1 (Needs 3 for moderate and high LOS)     []  Reviewed []  1 []  2 []  ? 3 unique laboratory, radiology, and/or diagnostic tests noted below    Test/Study:  on date .    []  Reviewed []  1 []  2 []  ? 3 prior external notes and incorporated into patient assessment  I reviewed Dr. 's note in specialty  from date .    []  Discussed management or test interpretation with external provider(s) as noted      []  Ordered []  1 []  2 []  ? 3 unique laboratory, radiology, and/or diagnostic tests noted in A&P    []  Obtained history from independent historian:       Category 2  []  Independently interpreted the test    Category 3  []  Discussed management or test interpretation with external provider(s) as noted      INTERACTION COMPLEXITY and SOCIAL DETERMINANTS of HEALTH       PROBLEM COMPLEXITY   []  New problem with uncertain diagnosis or prognosis (moderate)   []  Multiple stable chronic problems (moderate)   []  Chronic problem not stable - not controlled, symptomatic, or worsening (moderate)   []  Severe exacerbation of chronic problem (high)   []  New or chronic problem that poses threat to life or bodily function (high)     MANAGEMENT COMPLEXITY   []  Old or external/outside records reviewed   []  Discussion with alternate (proxy) if patient with impaired communication / comprehension ability (e.g., dementia, aphasia, severe hearing loss).   []  Repeated questions (or disagreement) between patient and/among caregivers/family during the visit.  []  Caregiver/patient emotions/behavior/beliefs interfering with implementation of treatment plan.  []  Independent interpretation of test (EKG, Chest XRay)   []  Discussion of case with a consultant physician     RISK LEVEL   []  Prescription drug management (moderate)   []  Minor surgery with CV risk factors or elective major surgery (moderate)   []  Dx or Rx significantly limited by SDoH (inadequate housing, living alone, poor health care access, inappropriate diet; low literacy) (moderate)   []  Major surgery - elective with CV risk factors or emergent (high)   []  Need for hospitalization (high)   []  New DNR or de-escalation of care (high)      SDoH  The diagnosis or treatment of said conditions is significantly limited by the following social determinants of health:  []  Z59.0 Homelessness  []  Z59.1 Inadequate housing  []  Z59.2 Discord with neighbors, lodgers and landlord  []  Z60.2 Problems related to living alone  []  Z59.8 Other problems related to housing and economic circumstances  []  Z59.4 Lack of adequate food and safe drinking water  []  Z59.6 Low income  []  Z59.7 Insufficient social insurance and welfare support  []  Z59.9 Problems related to housing and economic circumstances, unspecified  []  Z75.3 Unavailability and inaccessibility of health care facilities  []  Z75.4 Unavailability and inaccessibility of other helping agencies  []  Z72.4 Inappropriate diet and eating habits  []  Z62.820 Parent-biological child conflict  []  Z63.8 Other specified problems related to primary support group  []  Z55.0 Illiteracy and low level literacy   []  Z56.9 Unspecified problems related to employment        If Billing Based on Time:     I performed the following items on the day of service:    [x]  Preparing to see the patient (e.g., review of tests)  [x]  Obtaining and/or reviewing separately obtained history   [x]  Performing a medically appropriate examination and/or evaluation   [x]  Counseling and educating the patient/family/caregiver   [x]  Ordering medications, tests, or procedures  [x]  Referring and communicating with other healthcare professionals (when not separately reported)  [x]  Documenting clinical information in the EHR  []  Independently interpreting results and communicating results to patient/family/caregiver    I spent the following total amount of time on  these tasks on the day of service:  New Patient     Established Patient  []  15-29 minutes - 99202    []  up to 9 minutes - 99211  []  30-44 minutes - 99203     []  10-19 minutes - 99212   []  45-59 minutes - 99204      []  20-29 minutes - 99213   []  60-74 minutes - 99205   [x]  30- minutes - 99214         []  40-55 minutes - 99215    []  I spent an additional ** * 15-minute-increment(s) for a total of * ** minutes on these tasks on the day of service. [phone] for each additional 15 minutes.)    Author: York Ram, MD 01/24/2021      Janee Morn

## 2021-01-25 MED ORDER — BUPROPION HCL ER (SR) 200 MG PO TB12
200 mg | ORAL_TABLET | Freq: Two times a day (BID) | ORAL | 3 refills | Status: AC
Start: 2021-01-25 — End: ?

## 2021-01-27 NOTE — Telephone Encounter
Is the pt pt being referred to a surgeon?

## 2021-01-28 DIAGNOSIS — N1831 Stage 3a chronic kidney disease (HCC/RAF): Secondary | ICD-10-CM

## 2021-02-01 ENCOUNTER — Telehealth: Payer: BLUE CROSS/BLUE SHIELD

## 2021-02-07 ENCOUNTER — Non-Acute Institutional Stay: Payer: BLUE CROSS/BLUE SHIELD

## 2021-02-08 NOTE — Telephone Encounter
HEMATOLOGY/ONCOLOGY OUTPATIENT NOTE    PATIENT: [name]  MRN: [id number]  DOB: 1935/03/09  DATE OF SERVICE: 02/14/2021  Referring MD: York Ram., MD    Reason for Consultation/ Chief Complaint:  Bilateral breast cancer    HPI   [name] is a 85 y.o. female with PMH including myasthenia gravis, asymmetric hearing loss, fibromyalgia, DM with CKD, major depression, interstitial cystitis, IBS, COPD who presents to establish care for breast cancer.     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 07/19/2017 Screening detected right breast calcifications and distortion in the upper outer quadrant: Biopsy DCIS+ ALH +CSL; 4 cm apart, axilla negative.    Neoadjuvant tamoxifen started 10/30/17, stopped after 4-6 weeks due to dry mouth and mouth sores. Switched to anastrozole 01/29/18, discontinued because it made her throat dry. Declined additional antiestrogen therapy.   Moved on October of 2020 to [place]   ?  [name] then noted left breast pain in April 2022.     Underwent a diagnostic bilateral breast imaging 06/13/2020 which revealed:   ?- Right breast: solid mass with indistinct margins measuring 14 x 4 x 10 mm in the right breast at 1 o'clock located 6 centimeters from the nipple with associated microclip,  not parallel solid mass with irregular margins measuring 8 x 4 x 5 mm seen in the right breast at 12 o'clock, upper outer quadrant located 6 centimeters from the nipple.   ?- Left breast: irregular mass with angular margins measuring 10 x 9 x 9 mm seen in the left breast at 11 o'clock located 5 centimeters from the nipple, axillary lymph node in the left axilla with cortical thickness measuring up to 6 mm.    R breast biopsy at 12:00, 6 cm from nipple on 07/13/2020 revealed invasive ductal carcinoma with lobular and focal cribriform features, grade 2 ER (+) PR (+) HER2 (-), Ki-67 5%    L breast biopsy at 11:00, 5 cm from nipple on 07/13/2020 revealed invasive ductal carcinoma with lobular features, grade 2 ER (+) PR (-) HER2 (-), Ki-67 10%  ?  Breast MRI performed on 08/05/2020 that redemonstrated multifocal right breast malignancy and left breast IDC with an additional 4.3cm of NME that spanned nearly the entire left breast that appears suspicious.     [facility] genetic testing (-) for pathogenic variants.     08/17/2020 Patient presents to establish care for breast cancer. She has seen Dr. Janee Morn to discuss surgical treatment, and is quite worried about anesthesia with her myasthenia gravis . She is interested in neoadjuvant therapy     09/13/2020 Patient presents for follow-up of breast cancer. PETCT on 08/22/2020 with no evidence of FDG avid metastatic disease. DXA scan on 08/26/2020 demonstrated osteopenia of the left femoral neck and total hip.     10/28/2020 Patient presents for follow-up of breast cancer. Labs on 09/13/2020 showed WBC 5.3, hgb 11.4, platelets 300, ANC 3.58. Endorses fatigue, frequent diarrhea. BM anytime she eats, usually about 3 times a day. Diarrhea is more frequent in the morning. Also reports hot flashes. Breasts are tender, using a larger bra size.     11/25/2020 Patient presents for follow-up of breast cancer. Labs on 9/2/202 showed WBC 6.7, hgb 12.0, platelets 384,  ANC 4.80. Breast MRI on 11/18/2020 with stable to slightly decreased size of known biopsy proven malignancies in the right breast, decreased size of the irregular mass with microclip at 11:00, 5 cm  from the nipple, consistent with known malignancy, nonmass enhancement spanning 40 mm has also slightly decreased in size and now demonstrates subthreshold kinetics, this remains suspicious, previously described oval enhancing mass at 4:00 is no longer identified.     01/03/2021 Patient presents for follow-up of breast cancer. Labs on 12/20/2020 showed WBC 6.4, hgb 9.7, platelets 278, ANC 5.00.     02/14/2021 Patient presents for follow-up of breast cancer.        ECOG performance status: 0  PAST HISTORY   PMH:  Patient Active Problem List    Diagnosis Date Noted   ? Atypical chest pain 12/20/2020   ? Acute chest pain 12/18/2020   ? Immunosuppressed status (HCC/RAF) 03/21/2020   ? HTN (hypertension) 10/31/2019   ? Aortic calcification (HCC/RAF) 09/25/2019   ? Refractive error 04/28/2019   ? Ptosis of eyelid 04/28/2019   ? Pseudophakia of both eyes 04/28/2019   ? Benzodiazepine dependence (HCC/RAF) 12/20/2018   ? Breast cancer (HCC/RAF) 12/20/2018   ? Spondylosis without myelopathy or radiculopathy, lumbar region 11/28/2018   ? Anxiety 11/27/2018   ? CKD (chronic kidney disease), stage III (HCC/RAF) 11/27/2018   ? Nocturnal hypoxemia 04/07/2018     Last Assessment & Plan:   We discussed potential impact of hypoxemia from her COPD on other body function  Plan-overnight oximetry     ? Major depression, recurrent, chronic (HCC/RAF) 12/16/2017   ? Diabetes mellitus (HCC/RAF) 12/16/2017   ? Osteoarthritis of knee 05/17/2017   ? H/O arthroscopy 03/06/2017   ? Myasthenia gravis with exacerbation (HCC/RAF) 12/18/2016   ? Vaginal atrophy 08/16/2016   ? Acute medial meniscal tear, left, subsequent encounter 07/18/2016   ? Renal cyst 03/12/2016   ? Asthmatic bronchitis, mild persistent, with acute exacerbation 12/29/2015     Last Assessment & Plan:   Continue rescue inhaler as needed. Try adding sample Bevespi maintenance inhaler to see if this helps.  She is currently on prednisone 20 mg daily maintenance and I don't think adding a steroid inhaler component will make any difference.     ? Urethral caruncle 07/26/2015   ? Chronic cholecystitis 01/14/2015   ? Angioedema 05/23/2014     Attributed to injected methylprednisolone    Last Assessment & Plan:   We discussed limited ability to test for possible triggers. We are asking our labs sources about ability to test against methylprednisolone for injection.  A different brand might be worth trying. She can try pre-medicating with an antihistamine.     ? Chronic interstitial cystitis 10/01/2011   ? Insomnia 05/31/2007     Qualifier: Diagnosis of   By: Maple Hudson MD, Clinton D    Last Assessment & Plan:   I'm concerned that some of her sleep problems may reflect obstructive sleep apnea based on her palate and which she can tell me about her sleep patterns without a witness. We also want to know about oxygenation during sleep.  Plan-schedule sleep study  Qualifier: Diagnosis of   By: Maple Hudson MD, Clinton D      Last Assessment & Plan:   I'm concerned that some of her sleep problems may reflect obstructive sleep apnea based on her palate and which she can tell me about her sleep patterns without a witness. We also want to know about oxygenation during sleep.  Plan-schedule sleep study     ? COPD mixed type (HCC/RAF) 04/30/2007     Office Spirometry 03/16/2016-severe obstructive airways disease FVC 1.59/59%, FEV1 0.98/49%, ratio 0.62, FEF 25-75% 0.42/28%  PFT 04/16/16-severe obstructive airways disease with slight response to bronchodilator, severe diffusion defect. FEV1/FVC 0.64, DLCO 45%    Last Assessment & Plan:   Severe obstructive airways disease. She can't hear herself wheeze.  Plan-try samples of Trelegy instead of Anoro for comparison    Overview:   Office Spirometry 03/16/2016-severe obstructive airways disease FVC 1.59/59%, FEV1 0.98/49%, ratio 0.62, FEF 25-75% 0.42/28%  PFT 04/16/16-severe obstructive airways disease with slight response to bronchodilator, severe diffusion defect. FEV1/FVC 0.64, DLCO 45%    Last Assessment & Plan:   Severe obstructive airways disease. She can't hear herself wheeze.  Plan-try samples of Trelegy instead of Anoro for comparison  Office Spirometry 03/16/2016-severe obstructive airways disease FVC 1.59/59%, FEV1 0.98/49%, ratio 0.62, FEF 25-75% 0.42/28%  PFT 04/16/16-severe obstructive airways disease with slight response to bronchodilator, severe diffusion defect. FEV1/FVC 0.64, DLCO 45%    Last Assessment & Plan:   Severe COPD but mainly emphysema with limited bronchospasm component that could be addressed with bronchodilators.  Plan-try sample Bevespi as a maintenance controller, refill pro-air     ? Esophageal reflux 04/30/2007     History of fundoplication     Last Assessment & Plan:   Emphasized continued attention to antireflux measures. Reminded that reflux can be a significant trigger for irritable airways, cough and wheeze.  History of fundoplication       Last Assessment & Plan:   Emphasized continued attention to antireflux measures. Reminded that reflux can be a significant trigger for irritable airways, cough and wheeze.     ? 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 Management is mostly by her ENT physicians.     ? Fibromyalgia 04/30/2007     Qualifier: Diagnosis of   By: Yetta Barre CNA/MA, Rose Fillers: Diagnosis of   By: Yetta Barre CNA/MA, Shanda Bumps     ? Seasonal and perennial allergic rhinitis 04/30/2007     Qualifier: Diagnosis of   By: Yetta Barre CNA/MA, Shanda Bumps     Last Assessment & Plan:   She is now on prednisone 20 mg daily. I suggested she wait until her myasthenia status is stabilized. Then if she needs to she couldn't seek evaluation at one of the allergy practices in town as discussed, since our vaccine program will be closing.  Qualifier: Diagnosis of   By: Yetta Barre CNA/MA, Jessica       Last Assessment & Plan:   She is now on prednisone 20 mg daily. I suggested she wait until her myasthenia status is stabilized. Then if she needs to she couldn't seek evaluation at one of the allergy practices in town as discussed, since our vaccine program will be closing.     Tobacco: smoked  For 30 years     Surgical History:  No past surgical history on file.    Gyn Hx:  Menses 15   G2P2- first birth 10  Menopause :50's   Menopausal status: Postmenopausal  Hormone therapy use: None      Family and Social History  No family history on file.  Social History     Tobacco Use   ? Smoking status: Former     Types: Cigarettes     Quit date: 1985     Years since quitting: 37.9   ? Smokeless tobacco: Former   Substance Use Topics   ?  Alcohol use: Not Currently       MEDS     No outpatient medications have been marked as taking for the 02/14/21 encounter (Appointment) with Fredda Hammed., MD.     Allergies   Allergen Reactions   ? Beta Adrenergic Blockers Other (See Comments)     Avoid d/t Myasthenia Gravis  Avoid d/t Myasthenia Gravis     ? Levofloxacin Other (See Comments)     Flare of Myasthenia Gravis   ? Macrolides And Ketolides Other (See Comments)     Avoid d/t Myasthenia Gravis  Use with caution d/t Myasthenia Gravis  Use with caution d/t Myasthenia Gravis     ? Sulfa Antibiotics Other (See Comments)     May possibly have caused deafness in one ear  May possibly have caused deafness in one ear  other     ? Botulinum Toxins Other (See Comments)     Muscle weakness r/t Myasthenia Gravis  Muscle weakness r/t Myasthenia Gravis     ? Statins Other (See Comments)     Muscle aches  Muscle aches  Muscle aches     ? Plastic Tape Itching and Other (See Comments)     Turns the skin red where applied       PHYSICAL EXAM     There were no vitals filed for this visit.   There is no height or weight on file to calculate BMI.  Wt Readings from Last 3 Encounters:   02/14/21 66.7 kg (147 lb)   02/10/21 68 kg (150 lb)   01/10/21 67.1 kg (148 lb)       Review of Systems:  Pertinent items are noted in HPI.      Physical Exam:  General: No acute distress. Appears well-developed, well-nourished and close to stated age.   Head: Normocephalic, atraumatic.  Eyes: Sclera anicteric. EOMI.  ENT: Hearing grossly normal bilaterally. Oropharynx is clear, mucus membranes are moist.  No oral ulcers noted. Good dentition.  Neck: Supple. Trachea midline.   Cardiac: Regular rate and rhythm. Normal S1, S2. No murmurs, rubs, or gallops.  Respiratory: Clear to auscultation bilaterally. No wheezes, rales, or rhonchi noted. Respiratory effort appears normal.   Abdomen: Soft, nontender and nondistended. Bowel sounds are present and normoactive. No organomegaly is appreciated.  Musculoskeletal: No edema. No cyanosis. Extremities are warm and well-perfused.   Extremities: Warm. No edema. No cyanosis.  Neurologic: Gait appears normal. Sensation intact to light touch in all four extremities. Oriented to person, place and time.   Hematologic: No bruising, purpura or petechiae are noted.   Dermatologic: Skin intact.  No rashes appreciated.   Lymphatic: No palpable cervical, supraclavicular, axillary or inguinal adenopathy appreciated.   Psychiatric: Affect appropriate.  Pleasant and conversant.   Breast exam: no breast masses or axillary adenopathy noted     Recent Labs:  Lab Results   Component Value Date    WBC 6.40 12/20/2020    HGB 9.7 (L) 12/20/2020    HCT 28.7 (L) 12/20/2020    MCV 98.6 12/20/2020    PLT 278 12/20/2020      Lab Results   Component Value Date    NA 139 12/20/2020    K 3.9 12/20/2020    CL 104 12/20/2020    CO2 28 12/20/2020    CREAT 1.16 12/20/2020    BUN 18 12/20/2020     Lab Results   Component Value Date    ALT 11 12/18/2020    AST 28 12/18/2020  ALKPHOS 76 12/18/2020    BILITOT 0.4 12/18/2020     Lab Results   Component Value Date    CALCIUM 8.5 (L) 12/20/2020       Pertinent imaging:  12/18/2020 CT abd/pelvis wo contrast  1.  No acute CT abnormality in the abdomen or pelvis.  2.  Moderate hiatal hernia with postsurgical changes likely related to reported prior Nissen fundoplication.  3.  Left-sided diverticulosis without evidence diverticulitis.  4.  History of bilateral breast cancer without evidence of metastatic disease within the abdomen or pelvis.    12/18/2020 CTA chest   1.  No acute aortic process. No acute process within the chest.  2.  History of bilateral breast cancer without evidence of intrathoracic metastatic disease.  3.  Additional findings as above.    11/18/2020 MRI breast bilat  1. Right breast:   *  Stable to slightly decreased size of known biopsy proven malignancies in the right breast. BI-RADS CATEGORY 6 - KNOWN BIOPSY-PROVEN MALIGNANCY. Recommend continued surgical and oncologic management.   ?  2. Left breast:  *  Decreased size of the irregular mass with microclip at 11:00, 5 cm from the nipple, consistent with known malignancy. BI-RADS CATEGORY 6 - KNOWN BIOPSY-PROVEN MALIGNANCY. Recommend continued surgical and oncologic management.   *  Nonmass enhancement spanning 40 mm has also slightly decreased in size and now demonstrates subthreshold kinetics. This remains suspicious. If breast conservation is desired, an MRI guided biopsy is recommended. BI-RADS CATEGORY 4 - SUSPICIOUS.  *  Previously described oval enhancing mass at 4:00 is no longer identified. BI-RADS CATEGORY 2.  ?  OVERALL ASSESSMENT: BI-RADS CATEGORY 4 - SUSPICIOUS.    08/26/2020 DXA lumbar spine/hip  -----------------------------------------------------------------   Region ? ? ? ? ? ? ? ? ? BMD ? ?T-score ?Z-score ? Classification   -----------------------------------------------------------------   AP Spine(L1-L4) ? ? ? ? ?1.062 ? ?0.1 ? ? ?2.9 ? ? ? Normal   Femoral Neck (Left) ? ? ?0.723 ? -1.1 ? ? ?1.3 ? ? ? Osteopenia   Total Hip (Left) ? ? ? ? 0.725 ? -1.8 ? ? ?0.4 ? ? ? Osteopenia   -----------------------------------------------------------------   Impression: The BMD for  the AP Spine(L1-L4) decreased, changing by -2.4% since the last DXA exam.     08/22/2020 PETCT  1.  Mild to moderate left greater than right FDG activity associated with small soft tissue densities in the bilateral breasts in the regions marked by breast biopsy clips.   2.  No evidence of FDG avid metastatic disease.    06/19/2019 US pelvis  Small subserosal uterine fundal fibroid. Otherwise normal postmenopausal pelvic ultrasound.        Pertinent pathology:  08/02/2020 [facility] genetics      07/13/2020 Tissue exam  A. BREAST, RIGHT, MASS, 12:00, 6 CM FROM NIPPLE (NEEDLE CORE BIOPSY):  - Invasive ductal carcinoma with lobular and focal cribriform features, grade 2 (50% of biopsy; longest segment 4 mm)             Modified Bloom and Richardson score: 6 of 9                        Tubule formation:                3                        Nuclear pleomorphism:  2                        Mitotic score:                       1  - Ductal carcinoma in situ (DCIS), low nuclear grade, cribriform type with focal central necrosis  - Breast biomarkers: See below  - ERBB2 (HER2) FISH: Negative for HER2 gene amplification   ?  B.  BREAST, LEFT, MASS, 11:00, 5 CM FROM NIPPLE (NEEDLE CORE BIOPSY):  - Invasive ductal carcinoma with lobular features, grade 2 (50% of biopsy; longest involved segment 6 mm)             Modified Bloom and Richardson score: 6 of 9                        Tubule formation:                3                        Nuclear pleomorphism:       2                        Mitotic score:                       1  - Ductal carcinoma in situ (DCIS) is not identified  - Breast biomarkers: See immunohistochemistry report below  - ERBB2 (HER2) FISH: Negative [based on IHC (2+) and FISH, see comment].     COMMENT: It is uncertain whether patients with an average of >4.0 and <6.0 HER2?signals per cell and a HER2/CEP17 ratio of <2.0 benefit from HER2-targeted therapy in the absence of protein overexpression (IHC 3+). If the specimen test result is close to the [facility] ratio threshold for positive, there is a higher likelihood that repeat testing will result in different results by chance alone. Therefore, when [facility] results are not 3+ positive, it is recommended that the sample be considered negative without additional testing on the specimen.    BREAST BIOMARKER REPORT  ?  BLOCK:  A1  FIXATIVE:  Formalin   ?  RESULT ER/PR:  ? ESTROGEN   RECEPTOR PROGESTERONE RECEPTOR Antibody Clone SP1 Clone 636   %Tumor Staining >95% >95%   Intensity (1+ to 3+) 3+ 3+   ?  RESULT HER-2/neu IHC assay (utilizing FDA-approved DAKO Hercep Test): Arch Pathol Lab Med [phone], ASCO/CAP HER2 Testing in Breast Cancer Update - Veronia Beets al  ?  Test Score: 1+  ?  HER2/neu:  Negative  ?  RESULT Ki-67 (Clone MIB1):  5%  ?  BREAST BIOMARKER REPORT  ?  BLOCK:  B1  FIXATIVE:  Formalin   ?  RESULT ER/PR:  ? ESTROGEN   RECEPTOR PROGESTERONE RECEPTOR   Antibody Clone SP1 Clone 636   %Tumor Staining >95% 0%   Intensity (1+ to 3+) 3+ NA   ?  RESULT HER-2/neu IHC assay (utilizing FDA-approved DAKO Hercep Test): Arch Pathol Lab Med [phone], ASCO/CAP HER2 Testing in Breast Cancer Update - Veronia Beets al  ?  Test Score: 2+  ?  HER2/neu:  Equivocal  ?  RESULT Ki-67 (Clone MIB1):  10%    07/19/2017 Outside pathology        ASSESSMENT & PLAN   [name]  is a 85 y.o. female presents for     1. 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 She elected not to have surgery at that time. She took tamoxifen-->anastrozole for a short period of time, but discontinued endocrine therapy due to side effects.   - R breast biopsy at 12:00, 6 cm from nipple on 07/13/2020 revealed invasive ductal carcinoma with lobular and focal cribriform features, grade 2 ER (+) PR (+) HER2 (-), Ki-67 5%  - L breast biopsy at 11:00, 5 cm from nipple on 07/13/2020 revealed invasive ductal carcinoma with lobular features, grade 2 ER (+) PR (-) HER2 (-), Ki-67 10%  - We had a long discussion about biology and treatment of breast cancer, with emphasis that cure can only be attained with multimodality treatments such as surgery/ +/- radiation and systemic ( endocrine) therapy .  We have discussed that given her concern for anesthesia with Myasthenia Gravis, we cat try neoadjuvant endocrine therapy with close observation of side effects.  - We will start letrozole 2.5 mg po daily 08/17/2020 --> 10/28/2020 discontinued due to diarrhea  - tumors are not palpable on exam, and she would have to be followed with imaging   -  PETCT on 08/22/2020 with no evidence of FDG avid metastatic disease.   - Arimidex 10/28/2020 --->   - Breast MRI on 11/18/2020 with stable to slightly decreased size of known biopsy proven malignancies in the right breast, decreased size of the irregular mass with microclip at 11:00, 5 cm from the nipple, consistent with known malignancy, nonmass enhancement spanning 40 mm has also slightly decreased in size and now demonstrates subthreshold kinetics, this remains suspicious, previously described oval enhancing mass at 4:00 is no longer identified.   - CT CAP on 12/18/2020 without evidence of metastatic disease    02/14/2021   - Continue Arimidex 1 mg po daily        2.  Ear pain / fullness - in her left ear - resolved   - Augmentin 875 bid  - notified Dr. Brayton El, since no exam      3.  Myasthenia gravis   - F/u w Dr. Claudie Leach, Neurology, on 08/17/2020. No contraindication to possible surgery for DCIS. If she is to require surgery, we would likely ensure she has an IVIG infusion within 1 week prior to the procedure. EMG/NCS on 09/19/20 at 2:30 PM. To evaluate numbness and tingling in hands. Discontinue Mestinon (TID PRN; taking once daily recently). Monitor for improvement in hand cramps. Continue Azathioprine 75 mg BID (refilling today). Continue IVIG 60g (1g/kg) every 3 weeks ([place])     4.  Diarrhea  - Consult w Dr. Riley Kill on 02/10/2021. May be related to aromatase inhibitor. However she states it has been worse in the last 1-2 months and this medication class was started earlier in the year (attributed diarrhea to letrozole, swithced to arimidex in Sept). Will r/o infection, check FC. Can consider SIBO evaluation, colonoscopy for microscopic colitis.      5. Bone density  - DXA scan on 08/26/2020 demonstrated osteopenia of the left femoral neck and total hip.      6. Abnormal EKG with LBBB and PACs  - Consult w Dr. Cecile Sheerer 01/10/2021. Increase Losartan to 50 mg. Echo and Myoview         DIAGNOSES/ORDERS addressed on 02/14/2021:  1. Breast cancer (HCC/RAF)      - on Arimidex - continue close monitoring for toxicity of intensive drug therapy.    NEW PROBLEMS AS OF 02/14/2021:     []  New minor or self-limited problem (99202/99212).  []  2+ minor or self-limited problem, stable chronic illness, acute uncomplicated illness/injury (99203/99213).  []  Chronic illness with exacerbation/progression/tx side effect; 2+ stable chronic illnesses, new problem with uncertain prognosis, acute illness with systemic symptoms, or acute complicated injury (99204/99214).  []  Chronic illness with severe exacerbation/progression/tx side effect, acute/chronic illness or injury that poses threat to life or bodily function (99205/99215).      REVIEW OF DATA   I have  [x]  Reviewed/ordered []  1 []  2 [x]  ? 3 unique laboratory, radiology, and/or diagnostic tests noted below  No orders of the defined types were placed in this encounter.    12/18/2020 CT abd/pelvis wo contrast  12/18/2020 CTA chest   12/20/2020 CBC, BMP    [x]  Reviewed []  1 []  2 [x]  ? 3 prior external notes and incorporated into patient assessment   I reviewed Dr. Velvet Bathe, MD in Surgery, Surgical Oncology on 08/10/2020.  I reviewed Dr. York Ram, MD in Medicine, Internal Medicine on 07/13/2020.  I reviewed Dr. Johnney Killian. Desma Maxim, MD in Obstetrics & Gynecology on 05/07/2019.    []  Discussed management or test interpretation with external provider(s) on 02/14/2021 as noted:    RISK OF COMPLICATION   This writer has deemed the above diagnoses to have a risk of complication, morbidity or mortality of:   []  Minimal  []  Low  []  Moderate   []  due to prescription drug management   [] Decision re: minor surgery   [] Diagnosis or treatment limited by social determinants of health  []  Severe    []  due to intensive monitoring for chemotherapy toxicity   []  Decision elective surgery with risk factors  []  Decision regarding need for hospitalization  []  Advanced Care Planning decisions    SOCIAL DETERMINANTS OF HEALTH   []  The diagnosis or treatment of said conditions is significantly limited by social determinants of health:      I spent 30 minutes counseling and coordinating care for the items checked below on the day of service 02/14/2021  [x]  Preparing to see the patient (e.g., review of tests)  [x]  Obtaining and or reviewing separately obtained history   [x]  Performing a medically appropriate examination and/or evaluation   [x]  Counseling and educating the patient/family/caregiver   [x]  Ordering medications, tests, or procedures  []  Referring and communicating with other healthcare professionals (when not separately reported)  [x]  Documenting clinical information in the EHR  []  Independently interpreting results and communicating results to patient/ family/caregiver     No follow-ups on file.       The above recommendation were discussed with the patient. The patient has all questions answered satisfactorily and is in agreement with this recommended plan of care.    Scribe Signature:  I, Salley Scarlet, have scribed for [employer]. Oscar La, MD on this encounter with [name] at the [facility] in [place] on 02/14/2021 at 11:25 AM 02/14/2021.     Physician Signature:  I have reviewed this note, scribed by Salley Scarlet, I have edited and attest that it is an accurate representation of my evaluation.   Rhona Leavens. Oscar La, MD 02/14/2021 at 11:25 AM

## 2021-02-10 ENCOUNTER — Ambulatory Visit: Payer: BLUE CROSS/BLUE SHIELD | Attending: Gastroenterology

## 2021-02-10 ENCOUNTER — Telehealth: Payer: BLUE CROSS/BLUE SHIELD

## 2021-02-10 ENCOUNTER — Non-Acute Institutional Stay: Payer: BLUE CROSS/BLUE SHIELD | Attending: Gastroenterology

## 2021-02-10 DIAGNOSIS — R197 Diarrhea, unspecified: Secondary | ICD-10-CM

## 2021-02-10 NOTE — Consults
OUTPATIENT GASTROENTEROLOGY CONSULTATION    Date of service: 02/10/2021  Reason for consult: diarrhea  Referring Provider: Penelope Galas., NP  PCP: York Ram., MD    HISTORY OF PRESENT ILLNESS:   85 y.o. female with myasthenia gravis who presents for diarrhea.     S/p laparoscopic Nissen. GI team saw in hospital for GERD.     Since late October she has had loss of appetite, vomiting, diarrhea. Multiple episodes within short period of time. Watery. Diarrhea is daily. No changes in diet. She has mild associated abdominal discomfort. Previously she had intermittent diarrhea but not this consistently.     No new medications. Takes pyridostigmine 60mg  TID. Letrozole was previously discontinued due to diarrhea (per oncology) and she was switched to arimidex in Sept.     Has been on longstanding dexilant.     History of rectocele s/p repair.     PAST MEDICAL HISTORY:     Past Medical History:   Diagnosis Date   ? Breast cancer (HCC/RAF) 12/20/2018       PAST SURGICAL HISTORY:   No past surgical history on file.    ALLERGIES:    Beta adrenergic blockers, Levofloxacin, Macrolides and ketolides, Sulfa antibiotics, Botulinum toxins, Statins, and Plastic tape    HOME MEDICATIONS:     Outpatient Medications Prior to Visit   Medication Sig   ? Albuterol Sulfate AEPB Takes 1 to 2 puffs every 4 hours as needed for wheezing or SOB Inhale Takes 1 to 2 puffs every 4 hours as needed for wheezing or SOB ..   ? anastrozole 1 mg tablet Take 1 tablet (1 mg total) by mouth daily.   ? ascorbic acid 500 mg tablet Take 1 tablet (500 mg total) by mouth two (2) times daily with meals.   ? azaTHIOprine 50 mg tablet Take one and one-half tablet (75 mg total) by mouth two (2) times daily.   ? buPROPion, SR, (WELLBUTRIN SR) 200 mg 12 hr tablet Take 1 tablet (200 mg total) by mouth two (2) times daily.   ? clonazePAM 1 mg tablet 0.5 mg in am and 1 mg in pm.   ? DEXILANT 60 MG DR capsule Take 60 mg by mouth daily.   ? DULoxetine 20 mg DR capsule Take 3 capsules (60 mg total) by mouth daily.   ? estradiol (ESTRACE) 0.1 mg/g vaginal cream Place 1 g vaginally three (3) times a week.   ? fluticasone-vilanterol (BREO ELLIPTA) 100-25 mcg/inh inhaler Inhale 1 puff daily.   ? GAMUNEX-C 10 GM/100ML infusion    ? GAMUNEX-C 20 GM/200ML infusion    ? meclizine 25 mg tablet Take 1 tablet (25 mg total) by mouth three (3) times daily as needed for Dizziness.   ? montelukast 10 mg tablet Take 10 mg by mouth as needed for.   ? pyridostigmine 60 mg tablet Take 1 tablet (60 mg total) by mouth three (3) times daily as needed.   ? trimethoprim 100 mg tablet Take 1 tablet (100 mg total) by mouth daily.     Facility-Administered Medications Prior to Visit   Medication   ? lidocaine 2% inj 5 mL   ? lidocaine 2% inj 5 mL   ? lidocaine-EPINEPHrine 1 %-1:100000 inj 4.8 mL   ? lidocaine-EPINEPHrine 1 %-1:100000 inj 5 mL        FAMILY HISTORY:   No family history on file.    SOCIAL HISTORY:     Social History     Tobacco  Use   ? Smoking status: Former     Types: Cigarettes     Quit date: 1985     Years since quitting: 37.9   ? Smokeless tobacco: Former   Substance Use Topics   ? Alcohol use: Not Currently   ? Drug use: Never       REVIEW OF SYSTEMS:   A 14- point review of systems was negative except where noted in the HPI.    PHYSICAL EXAM:   BP 126/77  ~ Pulse (!) 106  ~ Temp 36 ?C (96.8 ?F) (Forehead)  ~ Wt 150 lb (68 kg)  ~ BMI 24.96 kg/m?     Gen: No acute distress, answers questions appropriately.  Eyes: Anicteric sclera, no conjunctival pallor  Neck: Trachea midline, good range of motion  Resp: normal WOB   CV: Regular rate and rhythm  Abd: Normoactive bowel sounds, soft, nontender, nondistended, no rebound or guarding  MSK: No peripheral edema, clubbing, or cyanosis   Neuro: Alert and oriented, grossly nonfocal, moving all extremities   Skin: Warm and well perfused, no rashes  Psych: Normal mood and affect    LABORATORY DATA:     Lab Results   Component Value Date WBC 6.40 12/20/2020    HGB 9.7 (L) 12/20/2020    HCT 28.7 (L) 12/20/2020    PLT 278 12/20/2020     Lab Results   Component Value Date    AST 28 12/18/2020    AST 20 10/28/2020    AST 21 09/13/2020    ALT 11 12/18/2020    ALT 7 10/28/2020    ALT 12 09/13/2020    BILITOT 0.4 12/18/2020    BILITOT 0.30 10/28/2020    BILITOT 0.40 09/13/2020    ALKPHOS 76 12/18/2020    ALKPHOS 67 10/28/2020    ALKPHOS 63 09/13/2020    INR 1.0 12/18/2020    ALBUMIN 3.8 (L) 12/18/2020      Lab Results   Component Value Date    CREAT 1.16 12/20/2020    BUN 18 12/20/2020    NA 139 12/20/2020    K 3.9 12/20/2020    CL 104 12/20/2020    CO2 28 12/20/2020    MG 2.0 (H) 12/20/2020    CALCIUM 8.5 (L) 12/20/2020     No results found for: HBVQPCR  No results found for: HCVQPCR    RADIOLOGY:     CT 12/19/20:    FINDINGS:  ?  ONCOLOGIC FINDINGS:  ?  History of bilateral breast cancer with:   - No metastatic disease in the abdomen or pelvis.  ?  ADDITIONAL FINDINGS:  Lack of intravenous contrast compromises evaluation of perfusion and for isodense lesions.  Lower chest: Please see separate chest report. Eventration of the right hemidiaphragm.  Liver: Unremarkable.  Gallbladder and bile ducts: Surgically absent gallbladder. No biliary dilatation.  Spleen: Unremarkable.  Pancreas: Diffusely atrophic.  Adrenals: Unremarkable.  Kidneys and ureters: Residual contrast within the bilateral collecting systems limits evaluation for stones. Bilateral renal cysts, measuring up to 2.5 cm in the left lower pole (2-60).   Bowel: Postsurgical changes at the GE junction, likely related to prior reported Nissen fundoplication. Moderate hiatal hernia. Left-sided diverticulosis without evidence of diverticulitis. Normal appendix. No bowel wall thickening or obstruction.  Bladder: Distended with contrast.  Reproductive organs: Normal uterus. No adnexal masses.  Lymph nodes: No lymphadenopathy.  Peritoneum: No free air, free fluid, or fluid collections.  Vessels: Mild atherosclerosis.  Abdominal wall: Unremarkable.  Bones:  Degenerative changes of the lumbar spine and bilateral hips..  ?  ?  IMPRESSION:  ?  1.  No acute CT abnormality in the abdomen or pelvis.  2.  Moderate hiatal hernia with postsurgical changes likely related to reported prior Nissen fundoplication.  3.  Left-sided diverticulosis without evidence diverticulitis.  4.  History of bilateral breast cancer without evidence of metastatic disease within the abdomen or pelvis.  ?  CTA 12/18/20:    ONCOLOGIC FINDINGS:  ?  History of bilateral breast cancer with:  ?  - Bilateral breast postbiopsy clips in areas of known biopsy-proven malignancy.  ?  ADDITIONAL FINDINGS:  ?  Pulmonary circulation: Although not timed for the evaluation of pulmonary emboli. No pulmonary emboli. The pulmonary artery is normal in caliber measuring up to 2.3 cm.  ?  Aorta: No thoracic aortic intramural hematoma, dissection, pseudoaneurysm, or ulcerated plaque. Calcified and noncalcified atherosclerotic plaque. Retropharyngeal course of the right common carotid artery.  ?  Lungs: Patent tracheobronchial tree. Diffuse bronchial and bronchiolar wall thickening. Upper lobe predominant moderate centrilobular emphysema. Left apex calcified granuloma (5-74). Stable subcentimeter pulmonary nodules, for example the right lower   lobe (5-197). No suspicious solid pulmonary nodules. Multifocal bibasilar atelectasis/scarring.  ?  Lymph nodes: No mediastinal, hilar, or axillary lymphadenopathy by size criteria.   ?  Pleura: No pleural effusions. No pneumothorax.  ?  Cardiovascular: Normal heart size. No pericardial effusion. Mild mitral valve annulus calcification.  ?  Osseous: No suspicious osseous lesions. Degenerative changes of the spine.  ?  Other: Heterogeneous thyroid gland containing multiple nodules with the largest measuring up to 7 mm in the right thyroid lobe (5-22).  ?  Upper abdomen: Moderate hiatal hernia. Status post cholecystectomy. Diffusely atrophic pancreas. Subcentimeter hyperenhancing focus in the spleen (5-239) is nonspecific but likely related to heterogeneous opacification of the spleen on arterial phase.  ?  ?  IMPRESSION:  ?  1.  No acute aortic process. No acute process within the chest.  2.  History of bilateral breast cancer without evidence of intrathoracic metastatic disease.  3.  Additional findings as above.  ?    ENDOSCOPY:     NA    ASSESSMENT/RECOMMENDATIONS:     Problem List Items Addressed This Visit    None  Visit Diagnoses     Diarrhea, unspecified type    -  Primary        1. Diarrhea. May be related to aromatase inhibitor. However she states it has been worse in the last 1-2 months and this medication class was started earlier in the year (attributed diarrhea to letrozole, swithced to arimidex in Sept). Will r/o infection, check FC. Can consider SIBO evaluation, colonoscopy for microscopic colitis.       Levora Dredge, MD   [facility]       Problem:   []  New minor/self-limited problem  []  Acute uncomplicated illness/injury   []   Acute systemic illness or complicated injury   []   New problem with uncertain diagnosis  []  New problem that poses threat to life or bodily function  []   2 chronic stable problems   [x]   1 chronic unstable problem     Review of Data:   I have   [x]  Reviewed/ordered []  1 []  2 [x]  ? 3 unique laboratory, radiology, and/or diagnostic tests noted above   []  Reviewed []  1 []  2 []  ? 3 prior external notes and incorporated into patient assessment   []   Discussed management or test interpretation with external provider(s) as noted     Risk of Complication:   This Probation officer has deemed the above diagnoses to have a risk of complication, morbidity or mortality of:   []  Minimal;    [x]  Low;     []  Moderate;     []  Severe

## 2021-02-10 NOTE — Telephone Encounter
Patient is aware lab/pathology (calprotectin stool) services may or may not be covered under insurance. Patient instructed to call insurance to verify coverage. Courtesy letter given and signed by patient.    DOS 02/10/2021 Patient informed [facility] may have additional Hospital fees for lab work and/or stool studies   Patient was provided with [place] lab locations.

## 2021-02-14 ENCOUNTER — Telehealth: Payer: BLUE CROSS/BLUE SHIELD | Attending: Hematology & Oncology

## 2021-02-14 ENCOUNTER — Telehealth: Payer: BLUE CROSS/BLUE SHIELD

## 2021-02-14 ENCOUNTER — Ambulatory Visit: Payer: BLUE CROSS/BLUE SHIELD | Attending: Neurology

## 2021-02-14 DIAGNOSIS — G959 Disease of spinal cord, unspecified: Secondary | ICD-10-CM

## 2021-02-14 NOTE — Telephone Encounter
Accepted PDL call    Paged NP Melissa to call pt's daughter [place]. Pt is experiencing severe nausea, fatigue, vomit, dizziness when pt gets up.     Also transferred call to [facility] to help with r/s or to see of pt can do a Zoom today?

## 2021-02-14 NOTE — Progress Notes
HEMATOLOGY/ONCOLOGY OUTPATIENT NOTE    PATIENT: [name]  MRN: [id number]  DOB: 1935-03-24  DATE OF SERVICE: 02/14/2021  Referring MD: York Ram., MD    Reason for Consultation/ Chief Complaint:  Bilateral breast cancer    HPI   [name] is a 85 y.o. female with PMH including myasthenia gravis, asymmetric hearing loss, fibromyalgia, DM with CKD, major depression, interstitial cystitis, IBS, COPD who presents to establish care for breast cancer.     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 07/19/2017 Screening detected right breast calcifications and distortion in the upper outer quadrant: Biopsy DCIS+ ALH +CSL; 4 cm apart, axilla negative.    Neoadjuvant tamoxifen started 10/30/17, stopped after 4-6 weeks due to dry mouth and mouth sores. Switched to anastrozole 01/29/18, discontinued because it made her throat dry. Declined additional antiestrogen therapy.   Moved on October of 2020 to [place]   ?  Ms. Batra then noted left breast pain in April 2022.     Underwent a diagnostic bilateral breast imaging 06/13/2020 which revealed:   ?- Right breast: solid mass with indistinct margins measuring 14 x 4 x 10 mm in the right breast at 1 o'clock located 6 centimeters from the nipple with associated microclip,  not parallel solid mass with irregular margins measuring 8 x 4 x 5 mm seen in the right breast at 12 o'clock, upper outer quadrant located 6 centimeters from the nipple.   ?- Left breast: irregular mass with angular margins measuring 10 x 9 x 9 mm seen in the left breast at 11 o'clock located 5 centimeters from the nipple, axillary lymph node in the left axilla with cortical thickness measuring up to 6 mm.    R breast biopsy at 12:00, 6 cm from nipple on 07/13/2020 revealed invasive ductal carcinoma with lobular and focal cribriform features, grade 2 ER (+) PR (+) HER2 (-), Ki-67 5%    L breast biopsy at 11:00, 5 cm from nipple on 07/13/2020 revealed invasive ductal carcinoma with lobular features, grade 2 ER (+) PR (-) HER2 (-), Ki-67 10%  ?  Breast MRI performed on 08/05/2020 that redemonstrated multifocal right breast malignancy and left breast IDC with an additional 4.3cm of NME that spanned nearly the entire left breast that appears suspicious.     [facility] genetic testing (-) for pathogenic variants.     08/17/2020 Patient presents to establish care for breast cancer. She has seen Dr. Janee Morn to discuss surgical treatment, and is quite worried about anesthesia with her myasthenia gravis . She is interested in neoadjuvant therapy     09/13/2020 Patient presents for follow-up of breast cancer. PETCT on 08/22/2020 with no evidence of FDG avid metastatic disease. DXA scan on 08/26/2020 demonstrated osteopenia of the left femoral neck and total hip.     10/28/2020 Patient presents for follow-up of breast cancer. Labs on 09/13/2020 showed WBC 5.3, hgb 11.4, platelets 300, ANC 3.58. Endorses fatigue, frequent diarrhea. BM anytime she eats, usually about 3 times a day. Diarrhea is more frequent in the morning. Also reports hot flashes. Breasts are tender, using a larger bra size.     11/25/2020 Patient presents for follow-up of breast cancer. Labs on 9/2/202 showed WBC 6.7, hgb 12.0, platelets 384,  ANC 4.80. Breast MRI on 11/18/2020 with stable to slightly decreased size of known biopsy proven malignancies in the right breast, decreased size of the irregular mass with microclip at 11:00, 5 cm  from the nipple, consistent with known malignancy, nonmass enhancement spanning 40 mm has also slightly decreased in size and now demonstrates subthreshold kinetics, this remains suspicious, previously described oval enhancing mass at 4:00 is no longer identified.     01/03/2021 Patient presents for follow-up of breast cancer. Labs on 12/20/2020 showed WBC 6.4, hgb 9.7, platelets 278, ANC 5.00.     02/14/2021 Patient presents for follow-up of breast cancer via telephone.        ECOG performance status: 0  PAST HISTORY   PMH:  Patient Active Problem List    Diagnosis Date Noted   ? Atypical chest pain 12/20/2020   ? Acute chest pain 12/18/2020   ? Immunosuppressed status (HCC/RAF) 03/21/2020   ? HTN (hypertension) 10/31/2019   ? Aortic calcification (HCC/RAF) 09/25/2019   ? Refractive error 04/28/2019   ? Ptosis of eyelid 04/28/2019   ? Pseudophakia of both eyes 04/28/2019   ? Benzodiazepine dependence (HCC/RAF) 12/20/2018   ? Breast cancer (HCC/RAF) 12/20/2018   ? Spondylosis without myelopathy or radiculopathy, lumbar region 11/28/2018   ? Anxiety 11/27/2018   ? CKD (chronic kidney disease), stage III (HCC/RAF) 11/27/2018   ? Nocturnal hypoxemia 04/07/2018     Last Assessment & Plan:   We discussed potential impact of hypoxemia from her COPD on other body function  Plan-overnight oximetry     ? Major depression, recurrent, chronic (HCC/RAF) 12/16/2017   ? Diabetes mellitus (HCC/RAF) 12/16/2017   ? Osteoarthritis of knee 05/17/2017   ? H/O arthroscopy 03/06/2017   ? Myasthenia gravis with exacerbation (HCC/RAF) 12/18/2016   ? Vaginal atrophy 08/16/2016   ? Acute medial meniscal tear, left, subsequent encounter 07/18/2016   ? Renal cyst 03/12/2016   ? Asthmatic bronchitis, mild persistent, with acute exacerbation 12/29/2015     Last Assessment & Plan:   Continue rescue inhaler as needed. Try adding sample Bevespi maintenance inhaler to see if this helps.  She is currently on prednisone 20 mg daily maintenance and I don't think adding a steroid inhaler component will make any difference.     ? Urethral caruncle 07/26/2015   ? Chronic cholecystitis 01/14/2015   ? Angioedema 05/23/2014     Attributed to injected methylprednisolone    Last Assessment & Plan:   We discussed limited ability to test for possible triggers. We are asking our labs sources about ability to test against methylprednisolone for injection.  A different brand might be worth trying. She can try pre-medicating with an antihistamine.     ? Chronic interstitial cystitis 10/01/2011   ? Insomnia 05/31/2007     Qualifier: Diagnosis of   By: Maple Hudson MD, Clinton D    Last Assessment & Plan:   I'm concerned that some of her sleep problems may reflect obstructive sleep apnea based on her palate and which she can tell me about her sleep patterns without a witness. We also want to know about oxygenation during sleep.  Plan-schedule sleep study  Qualifier: Diagnosis of   By: Maple Hudson MD, Clinton D      Last Assessment & Plan:   I'm concerned that some of her sleep problems may reflect obstructive sleep apnea based on her palate and which she can tell me about her sleep patterns without a witness. We also want to know about oxygenation during sleep.  Plan-schedule sleep study     ? COPD mixed type (HCC/RAF) 04/30/2007     Office Spirometry 03/16/2016-severe obstructive airways disease FVC 1.59/59%, FEV1 0.98/49%, ratio 0.62, FEF  25-75% 0.42/28%  PFT 04/16/16-severe obstructive airways disease with slight response to bronchodilator, severe diffusion defect. FEV1/FVC 0.64, DLCO 45%    Last Assessment & Plan:   Severe obstructive airways disease. She can't hear herself wheeze.  Plan-try samples of Trelegy instead of Anoro for comparison    Overview:   Office Spirometry 03/16/2016-severe obstructive airways disease FVC 1.59/59%, FEV1 0.98/49%, ratio 0.62, FEF 25-75% 0.42/28%  PFT 04/16/16-severe obstructive airways disease with slight response to bronchodilator, severe diffusion defect. FEV1/FVC 0.64, DLCO 45%    Last Assessment & Plan:   Severe obstructive airways disease. She can't hear herself wheeze.  Plan-try samples of Trelegy instead of Anoro for comparison  Office Spirometry 03/16/2016-severe obstructive airways disease FVC 1.59/59%, FEV1 0.98/49%, ratio 0.62, FEF 25-75% 0.42/28%  PFT 04/16/16-severe obstructive airways disease with slight response to bronchodilator, severe diffusion defect. FEV1/FVC 0.64, DLCO 45%    Last Assessment & Plan: Severe COPD but mainly emphysema with limited bronchospasm component that could be addressed with bronchodilators.  Plan-try sample Bevespi as a maintenance controller, refill pro-air     ? Esophageal reflux 04/30/2007     History of fundoplication     Last Assessment & Plan:   Emphasized continued attention to antireflux measures. Reminded that reflux can be a significant trigger for irritable airways, cough and wheeze.  History of fundoplication       Last Assessment & Plan:   Emphasized continued attention to antireflux measures. Reminded that reflux can be a significant trigger for irritable airways, cough and wheeze.     ? 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 Management is mostly by her ENT physicians.     ? Fibromyalgia 04/30/2007     Qualifier: Diagnosis of   By: Yetta Barre CNA/MA, Rose Fillers: Diagnosis of   By: Yetta Barre CNA/MA, Shanda Bumps     ? Seasonal and perennial allergic rhinitis 04/30/2007     Qualifier: Diagnosis of   By: Yetta Barre CNA/MA, Shanda Bumps     Last Assessment & Plan:   She is now on prednisone 20 mg daily. I suggested she wait until her myasthenia status is stabilized. Then if she needs to she couldn't seek evaluation at one of the allergy practices in town as discussed, since our vaccine program will be closing.  Qualifier: Diagnosis of   By: Yetta Barre CNA/MA, Jessica       Last Assessment & Plan:   She is now on prednisone 20 mg daily. I suggested she wait until her myasthenia status is stabilized. Then if she needs to she couldn't seek evaluation at one of the allergy practices in town as discussed, since our vaccine program will be closing.     Tobacco: smoked  For 30 years     Surgical History:  No past surgical history on file.    Gyn Hx:  Menses 15   G2P2- first birth 34  Menopause :50's   Menopausal status: Postmenopausal  Hormone therapy use: None      Family and Social History  No family history on file.  Social History     Tobacco Use   ? Smoking status: Former     Types: Cigarettes     Quit date: 1985     Years since quitting: 37.9   ? Smokeless tobacco: Former   Retail buyer  Topics   ? Alcohol use: Not Currently       MEDS     No outpatient medications have been marked as taking for the 02/14/21 encounter (Telephone) with Fredda Hammed., MD.     Allergies   Allergen Reactions   ? Beta Adrenergic Blockers Other (See Comments)     Avoid d/t Myasthenia Gravis  Avoid d/t Myasthenia Gravis     ? Levofloxacin Other (See Comments)     Flare of Myasthenia Gravis   ? Macrolides And Ketolides Other (See Comments)     Avoid d/t Myasthenia Gravis  Use with caution d/t Myasthenia Gravis  Use with caution d/t Myasthenia Gravis     ? Sulfa Antibiotics Other (See Comments)     May possibly have caused deafness in one ear  May possibly have caused deafness in one ear  other     ? Botulinum Toxins Other (See Comments)     Muscle weakness r/t Myasthenia Gravis  Muscle weakness r/t Myasthenia Gravis     ? Statins Other (See Comments)     Muscle aches  Muscle aches  Muscle aches     ? Plastic Tape Itching and Other (See Comments)     Turns the skin red where applied       PHYSICAL EXAM     There were no vitals filed for this visit.   There is no height or weight on file to calculate BMI.  Wt Readings from Last 3 Encounters:   02/14/21 66.7 kg (147 lb)   02/10/21 68 kg (150 lb)   01/10/21 67.1 kg (148 lb)       Review of Systems:  Pertinent items are noted in HPI.      Physical Exam: telephone visit no physical exam       Recent Labs:  Lab Results   Component Value Date    WBC 6.40 12/20/2020    HGB 9.7 (L) 12/20/2020    HCT 28.7 (L) 12/20/2020    MCV 98.6 12/20/2020    PLT 278 12/20/2020      Lab Results   Component Value Date    NA 139 12/20/2020    K 3.9 12/20/2020    CL 104 12/20/2020    CO2 28 12/20/2020    CREAT 1.16 12/20/2020    BUN 18 12/20/2020 Lab Results   Component Value Date    ALT 11 12/18/2020    AST 28 12/18/2020    ALKPHOS 76 12/18/2020    BILITOT 0.4 12/18/2020     Lab Results   Component Value Date    CALCIUM 8.5 (L) 12/20/2020       Pertinent imaging:  12/18/2020 CT abd/pelvis wo contrast  1.  No acute CT abnormality in the abdomen or pelvis.  2.  Moderate hiatal hernia with postsurgical changes likely related to reported prior Nissen fundoplication.  3.  Left-sided diverticulosis without evidence diverticulitis.  4.  History of bilateral breast cancer without evidence of metastatic disease within the abdomen or pelvis.    12/18/2020 CTA chest   1.  No acute aortic process. No acute process within the chest.  2.  History of bilateral breast cancer without evidence of intrathoracic metastatic disease.  3.  Additional findings as above.    11/18/2020 MRI breast bilat  1. Right breast:   *  Stable to slightly decreased size of known biopsy proven malignancies in the right breast. BI-RADS CATEGORY 6 - KNOWN BIOPSY-PROVEN MALIGNANCY. Recommend continued surgical and  oncologic management.   ?  2. Left breast:  *  Decreased size of the irregular mass with microclip at 11:00, 5 cm from the nipple, consistent with known malignancy. BI-RADS CATEGORY 6 - KNOWN BIOPSY-PROVEN MALIGNANCY. Recommend continued surgical and oncologic management.   *  Nonmass enhancement spanning 40 mm has also slightly decreased in size and now demonstrates subthreshold kinetics. This remains suspicious. If breast conservation is desired, an MRI guided biopsy is recommended. BI-RADS CATEGORY 4 - SUSPICIOUS.  *  Previously described oval enhancing mass at 4:00 is no longer identified. BI-RADS CATEGORY 2.  ?  OVERALL ASSESSMENT: BI-RADS CATEGORY 4 - SUSPICIOUS.    08/26/2020 DXA lumbar spine/hip  -----------------------------------------------------------------   Region ? ? ? ? ? ? ? ? ? BMD ? ?T-score ?Z-score ? Classification -----------------------------------------------------------------   AP Spine(L1-L4) ? ? ? ? ?1.062 ? ?0.1 ? ? ?2.9 ? ? ? Normal   Femoral Neck (Left) ? ? ?0.723 ? -1.1 ? ? ?1.3 ? ? ? Osteopenia   Total Hip (Left) ? ? ? ? 0.725 ? -1.8 ? ? ?0.4 ? ? ? Osteopenia   -----------------------------------------------------------------   Impression: The BMD for  the AP Spine(L1-L4) decreased, changing by -2.4% since the last DXA exam.     08/22/2020 PETCT  1.  Mild to moderate left greater than right FDG activity associated with small soft tissue densities in the bilateral breasts in the regions marked by breast biopsy clips.   2.  No evidence of FDG avid metastatic disease.    06/19/2019 US pelvis  Small subserosal uterine fundal fibroid. Otherwise normal postmenopausal pelvic ultrasound.        Pertinent pathology:  08/02/2020 [facility] genetics      07/13/2020 Tissue exam  A. BREAST, RIGHT, MASS, 12:00, 6 CM FROM NIPPLE (NEEDLE CORE BIOPSY):  - Invasive ductal carcinoma with lobular and focal cribriform features, grade 2 (50% of biopsy; longest segment 4 mm)             Modified Bloom and Richardson score: 6 of 9                        Tubule formation:                3                        Nuclear pleomorphism:       2                        Mitotic score:                       1  - Ductal carcinoma in situ (DCIS), low nuclear grade, cribriform type with focal central necrosis  - Breast biomarkers: See below  - ERBB2 (HER2) FISH: Negative for HER2 gene amplification   ?  B.  BREAST, LEFT, MASS, 11:00, 5 CM FROM NIPPLE (NEEDLE CORE BIOPSY):  - Invasive ductal carcinoma with lobular features, grade 2 (50% of biopsy; longest involved segment 6 mm)             Modified Bloom and Richardson score: 6 of 9                        Tubule formation:  3                        Nuclear pleomorphism:       2                        Mitotic score:                       1  - Ductal carcinoma in situ (DCIS) is not identified  - Breast biomarkers: See immunohistochemistry report below  - ERBB2 (HER2) FISH: Negative [based on IHC (2+) and FISH, see comment].     COMMENT: It is uncertain whether patients with an average of >4.0 and <6.0 HER2?signals per cell and a HER2/CEP17 ratio of <2.0 benefit from HER2-targeted therapy in the absence of protein overexpression (IHC 3+). If the specimen test result is close to the [facility] ratio threshold for positive, there is a higher likelihood that repeat testing will result in different results by chance alone. Therefore, when [facility] results are not 3+ positive, it is recommended that the sample be considered negative without additional testing on the specimen.    BREAST BIOMARKER REPORT  ?  BLOCK:  A1  FIXATIVE:  Formalin   ?  RESULT ER/PR:  ? ESTROGEN   RECEPTOR PROGESTERONE RECEPTOR   Antibody Clone SP1 Clone 636   %Tumor Staining >95% >95%   Intensity (1+ to 3+) 3+ 3+   ?  RESULT HER-2/neu IHC assay (utilizing FDA-approved DAKO Hercep Test): Arch Pathol Lab Med [phone], ASCO/CAP HER2 Testing in Breast Cancer Update - Veronia Beets al  ?  Test Score: 1+  ?  HER2/neu:  Negative  ?  RESULT Ki-67 (Clone MIB1):  5%  ?  BREAST BIOMARKER REPORT  ?  BLOCK:  B1  FIXATIVE:  Formalin   ?  RESULT ER/PR:  ? ESTROGEN   RECEPTOR PROGESTERONE RECEPTOR   Antibody Clone SP1 Clone 636   %Tumor Staining >95% 0%   Intensity (1+ to 3+) 3+ NA   ?  RESULT HER-2/neu IHC assay (utilizing FDA-approved DAKO Hercep Test): Arch Pathol Lab Med [phone], ASCO/CAP HER2 Testing in Breast Cancer Update - Veronia Beets al  ?  Test Score: 2+  ?  HER2/neu:  Equivocal  ?  RESULT Ki-67 (Clone MIB1):  10%    07/19/2017 Outside pathology        ASSESSMENT & PLAN   Brennah Quraishi is a 85 y.o. female presents for     1. 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 She elected not to have surgery at that time. She took tamoxifen-->anastrozole for a short period of time, but discontinued endocrine therapy due to side effects.   - R breast biopsy at 12:00, 6 cm from nipple on 07/13/2020 revealed invasive ductal carcinoma with lobular and focal cribriform features, grade 2 ER (+) PR (+) HER2 (-), Ki-67 5%  - L breast biopsy at 11:00, 5 cm from nipple on 07/13/2020 revealed invasive ductal carcinoma with lobular features, grade 2 ER (+) PR (-) HER2 (-), Ki-67 10%  - We had a long discussion about biology and treatment of breast cancer, with emphasis that cure can only be attained with multimodality treatments such as surgery/ +/- radiation and systemic ( endocrine) therapy .  We  have discussed that given her concern for anesthesia with Myasthenia Gravis, we cat try neoadjuvant endocrine therapy with close observation of side effects.  - We will start letrozole 2.5 mg po daily 08/17/2020 --> 10/28/2020 discontinued due to diarrhea  - tumors are not palpable on exam, and she would have to be followed with imaging   -  PETCT on 08/22/2020 with no evidence of FDG avid metastatic disease.   - Arimidex 10/28/2020 --->   - Breast MRI on 11/18/2020 with stable to slightly decreased size of known biopsy proven malignancies in the right breast, decreased size of the irregular mass with microclip at 11:00, 5 cm from the nipple, consistent with known malignancy, nonmass enhancement spanning 40 mm has also slightly decreased in size and now demonstrates subthreshold kinetics, this remains suspicious, previously described oval enhancing mass at 4:00 is no longer identified.   - CT CAP on 12/18/2020 without evidence of metastatic disease  ?  02/14/2021   - Continue Arimidex 1 mg po daily        2.  Ear pain / fullness - in her left ear - resolved   - Augmentin 875 bid  - notified Dr. Brayton El, since no exam      3.  Myasthenia gravis   - F/u w Dr. Claudie Leach, Neurology, on 08/17/2020. No contraindication to possible surgery for DCIS. If she is to require surgery, we would likely ensure she has an IVIG infusion within 1 week prior to the procedure. EMG/NCS on 09/19/20 at 2:30 PM. To evaluate numbness and tingling in hands. Discontinue Mestinon (TID PRN; taking once daily recently). Monitor for improvement in hand cramps. Continue Azathioprine 75 mg BID (refilling today). Continue IVIG 60g (1g/kg) every 3 weeks ([place])     4.  Diarrhea  - Consult w Dr. Riley Kill on 02/10/2021. May be related to aromatase inhibitor. However she states it has been worse in the last 1-2 months and this medication class was started earlier in the year (attributed diarrhea to letrozole, swithced to arimidex in Sept). Will r/o infection, check FC. Can consider SIBO evaluation, colonoscopy for microscopic colitis.?     5. Bone density  - DXA scan on 08/26/2020 demonstrated osteopenia of the left femoral neck and total hip.      6. Abnormal EKG with LBBB and PACs  - Consult w Dr. Cecile Sheerer 01/10/2021. Increase Losartan to 50 mg. Echo and Myoview         DIAGNOSES/ORDERS addressed on 02/14/2021:  1. Breast cancer (HCC/RAF)      - on Arimidex - continue close monitoring for toxicity of intensive drug therapy.    NEW PROBLEMS AS OF 02/14/2021:     []  New minor or self-limited problem (99202/99212).  []  2+ minor or self-limited problem, stable chronic illness, acute uncomplicated illness/injury (99203/99213).  []  Chronic illness with exacerbation/progression/tx side effect; 2+ stable chronic illnesses, new problem with uncertain prognosis, acute illness with systemic symptoms, or acute complicated injury (99204/99214).  []  Chronic illness with severe exacerbation/progression/tx side effect, acute/chronic illness or injury that poses threat to life or bodily function (99205/99215).      REVIEW OF DATA   I have  [x]  Reviewed/ordered []  1 []  2 [x]  ? 3 unique laboratory, radiology, and/or diagnostic tests noted below  No orders of the defined types were placed in this encounter.    12/18/2020 CT abd/pelvis wo contrast  12/18/2020 CTA chest   12/20/2020 CBC, BMP    [x]  Reviewed []  1 []  2 [x]  ?  3 prior external notes and incorporated into patient assessment   I reviewed Dr. Velvet Bathe, MD in Surgery, Surgical Oncology on 08/10/2020.  I reviewed Dr. York Ram, MD in Medicine, Internal Medicine on 07/13/2020.  I reviewed Dr. Johnney Killian. Desma Maxim, MD in Obstetrics & Gynecology on 05/07/2019.    []  Discussed management or test interpretation with external provider(s) on 02/14/2021 as noted:    RISK OF COMPLICATION   This writer has deemed the above diagnoses to have a risk of complication, morbidity or mortality of:   []  Minimal  []  Low  []  Moderate   []  due to prescription drug management   [] Decision re: minor surgery   [] Diagnosis or treatment limited by social determinants of health  []  Severe    []  due to intensive monitoring for chemotherapy toxicity   []  Decision elective surgery with risk factors  []  Decision regarding need for hospitalization  []  Advanced Care Planning decisions    SOCIAL DETERMINANTS OF HEALTH   []  The diagnosis or treatment of said conditions is significantly limited by social determinants of health:      I spent 30 minutes counseling and coordinating care for the items checked below on the day of service 02/14/2021  [x]  Preparing to see the patient (e.g., review of tests)  [x]  Obtaining and or reviewing separately obtained history   [x]  Performing a medically appropriate examination and/or evaluation   [x]  Counseling and educating the patient/family/caregiver   [x]  Ordering medications, tests, or procedures  []  Referring and communicating with other healthcare professionals (when not separately reported)  [x]  Documenting clinical information in the EHR  []  Independently interpreting results and communicating results to patient/ family/caregiver     No follow-ups on file.       The above recommendation were discussed with the patient. The patient has all questions answered satisfactorily and is in agreement with this recommended plan of care.    Scribe Signature:  I, Salley Scarlet, have scribed for [employer]. Oscar La, MD on this encounter with [name] at the [facility] in [place] on 02/14/2021 at 4:50 PM 02/14/2021.     Physician Signature:  I have reviewed this note, scribed by Salley Scarlet, I have edited and attest that it is an accurate representation of my evaluation.   Rhona Leavens. Oscar La, MD 02/14/2021 at 4:50 PM

## 2021-02-14 NOTE — Telephone Encounter
PDL Call to Clinic    Reason for Call: pt daughter is calling to reschedule her mother's appt for today at 1:20 pm.     Appointment Related?  [x]  Yes  []  No     If yes;  Date:02/14/2021  Time:1:20pm    Call warm transferred to PDL: [x]  Yes  []  No    Call Received by Clinic Representative: anna    If call not answered/not accepted, call received by Patient Services Representative:

## 2021-02-14 NOTE — Progress Notes
[facility] Progress Note  PATIENT: [name]  MRN:  [id number]  DOB:  September 11, 1935  DATE OF SERVICE:  02/14/2021  ATTENDING PHYSICIAN: Odetta Pink., MD, PhD  PRIMARY CARE PHYSICIAN: York Ram., MD    ID/CC: Mrs. Brizuela is a 85 y.o. 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    Interval Events:    Last clinic visit was on 08/17/2020.    She continues to experience neck weakness and leg weakness; reports that her legs give out when she walks, and had a fall last 2 weeks ago missing a step. Changing IVIg from q3week to q2week did not help. She reports getting IVIG for the past 7 years from [name]. Reports mild left ptosis and occasional slurred speech which is not getting worse and does not respond to IVIg. She is a hard-stick and would like to go back to q3week regimen.  Reports previous adverse events with corticosteroids. Also has sulfa allergy.    She had ER visit with chest pain and shortness of breath. She is seeing cardiology for new LBBB and gastroenterology for diarrhea and vomiting with hiatal hernia. Also in the process of staging for her history of ER+ / HER2 - breast cancer. She prefers not to proceed with breast cancer.    Current Regimen:  Imuran 75 mg BID  IVIG 60 g q2weeks     Previously Failed  Prednisone - oral ulcers    Past Medical History:  Fibromyalgia    Breast cancer (2017) - contained, no tx   Interstitial cystitis x 30 years   Fall with brief LOC (09/2016)  Fall with knee infection/contusion (10/2018)  Depression and anxiety   Diverticulitis   COPD  Asthma     Past Surgical History:  Meniscus tear repair in left knee    Family History:   Adopted at birth. Birth mother may have had breast cancer.   Brother(58) and sister (81) were both adopted from different families    Social History:   She moved from her independent living place in [place], [place] to [place] with her daugther.   Pt  reports that she quit smoking about 37 years ago. Her smoking use included cigarettes. She has quit using smokeless tobacco. She reports that she does not currently use alcohol. She reports that she does not use drugs. Smoked 0.5 PPD from 1960 -1985. Denies EtOH or recreational drug use. Retired, college-educated, widowed.     Allergies:  Allergies   Allergen Reactions   ? Beta Adrenergic Blockers Other (See Comments)     Avoid d/t Myasthenia Gravis  Avoid d/t Myasthenia Gravis     ? Levofloxacin Other (See Comments)     Flare of Myasthenia Gravis   ? Macrolides And Ketolides Other (See Comments)     Avoid d/t Myasthenia Gravis  Use with caution d/t Myasthenia Gravis  Use with caution d/t Myasthenia Gravis     ? Sulfa Antibiotics Other (See Comments)     May possibly have caused deafness in one ear  May possibly have caused deafness in one ear  other     ? Botulinum Toxins Other (See Comments)     Muscle weakness r/t Myasthenia Gravis  Muscle weakness r/t Myasthenia Gravis     ? Statins Other (See Comments)     Muscle aches  Muscle aches  Muscle aches     ?  Plastic Tape Itching and Other (See Comments)     Turns the skin red where applied      Medications:   Outpatient Medications Prior to Visit   Medication Sig   ? Albuterol Sulfate AEPB Takes 1 to 2 puffs every 4 hours as needed for wheezing or SOB Inhale Takes 1 to 2 puffs every 4 hours as needed for wheezing or SOB ..   ? anastrozole 1 mg tablet Take 1 tablet (1 mg total) by mouth daily.   ? ascorbic acid 500 mg tablet Take 1 tablet (500 mg total) by mouth two (2) times daily with meals.   ? azaTHIOprine 50 mg tablet Take one and one-half tablet (75 mg total) by mouth two (2) times daily.   ? buPROPion, SR, (WELLBUTRIN SR) 200 mg 12 hr tablet Take 1 tablet (200 mg total) by mouth two (2) times daily.   ? clonazePAM 1 mg tablet 0.5 mg in am and 1 mg in pm.   ? DEXILANT 60 MG DR capsule Take 60 mg by mouth daily.   ? DULoxetine 20 mg DR capsule Take 3 capsules (60 mg total) by mouth daily.   ? estradiol (ESTRACE) 0.1 mg/g vaginal cream Place 1 g vaginally three (3) times a week.   ? fluticasone-vilanterol (BREO ELLIPTA) 100-25 mcg/inh inhaler Inhale 1 puff daily.   ? GAMUNEX-C 10 GM/100ML infusion    ? GAMUNEX-C 20 GM/200ML infusion    ? meclizine 25 mg tablet Take 1 tablet (25 mg total) by mouth three (3) times daily as needed for Dizziness.   ? montelukast 10 mg tablet Take 10 mg by mouth as needed for.   ? pyridostigmine 60 mg tablet Take 1 tablet (60 mg total) by mouth three (3) times daily as needed.   ? trimethoprim 100 mg tablet Take 1 tablet (100 mg total) by mouth daily.     Facility-Administered Medications Prior to Visit   Medication   ? lidocaine 2% inj 5 mL   ? lidocaine 2% inj 5 mL   ? lidocaine-EPINEPHrine 1 %-1:100000 inj 4.8 mL   ? lidocaine-EPINEPHrine 1 %-1:100000 inj 5 mL     Physical Exam  BP 132/72  ~ Pulse (!) 102  ~ Temp 36.3 ?C (97.3 ?F) (Forehead)  ~ Ht 5' 5'' (1.651 m)  ~ Wt 147 lb (66.7 kg)  ~ SpO2 96%  ~ BMI 24.46 kg/m?    Gen: NAD, well-appearing, cooperative, appears younger than stated age.  Resp:  Able to count to 18 on a single breath.     Neurological Exam  Mental Status  Awake, alert and oriented to person, place and time. Recent and remote memory are intact. Mild dysarthria present. Language is fluent with no aphasia. Attention and concentration are normal. Fund of knowledge is appropriate for level of education.  Cranial Nerves  CN II: Visual fields full to confrontation. Blurry vision but no double vision.  CN III, IV, VI: Extraocular movements intact bilaterally. Sustained upward gaze for 1 minute does not elicit ptosis or diplopia.  CN V: Facial sensation is normal.  CN VII: Full and symmetric facial movement.  CN VIII: Hearing is normal.  CN IX, X: Palate elevates symmetrically. Normal gag reflex.  CN XI: Shoulder shrug strength is normal.  CN XII: Tongue midline without atrophy or fasciculations.  No left ptosis.    Motor  Normal muscle bulk throughout. No fasciculations present. Normal muscle tone.    Neck flexion  3 (patient reluctant)  Neck extension                      4                                               Right                     Left  Shoulder abduction               4-                          4-  Elbow flexion                         4                           4   Elbow extension                    4                           4  Wrist flexion                           4-                          4-  Wrist extension                      4-                          4-  Hip flexion                              4-                          4-  Knee extension                      4-                          4-  Plantarflexion                         4                          4  Dorsiflexion                            4                          4  Lower extremity strength is partly affected by effort.    Sensory  Light touch is normal in upper and lower extremities.   Reflexes  Right                      Left  Brachioradialis                    3+                         3+  Biceps                                 2+                         2+  Triceps                                2+                         2+  Patellar                                3+                         3+  Achilles                                2+                         2+  Right pathological reflexes: Hoffmann's absent. Ankle clonus absent.  Left pathological reflexes: Hoffmann's absent. Ankle clonus absent.  Coordination  Mild bilateral intention tremors.  Gait  Casual gait: Unable to rise from chair without using arms.  Walks slowly with stooped posture and mildly spastic gait.    Labs ?  Labs 08/26/2015: AChR binding (0.63) blocking (28) antibody, ESR 3, CRP 4.9    09/22/2019 11:42 AM  Component Value Ref Range & Units Status   Vitamin B12 217Low   254-1,060 pg/mL Final     Imaging and Diagnostics     DXA 06/17/2019    Impression: The patient has low bone mass, based on the Left Total Hip T-score. The patient has an estimated ten-year risk of hip fracture of 3% and an estimated ten-year risk of major fracture of 13%, based on the [facility] FRAX algorithm.    Repetitive Nerve stimulation in office 12/23/2018: No decrement after stimulation of CN XI and left ulnar nerve     (from neurologist Dr. Eliane Decree note)    MRI/MRA head 09/09/2015:  This MR angiogram of the intracerebral arteries shows the following:  1. ??Very minimal atherosclerotic change within the left posterior cerebral artery that is unlikely to be clinically significant.  2. ??There is a normal variant with the left posterior cerebral artery obtaining its flow from the anterior circulation.  3. ???No aneurysms are seen.  4. ???No new findings compared to the 11/25/2003 MRA. ??  ?  MRI orbit wwo contrast 09/09/2015: This is is a normal MRI of the orbits with and without contrast.    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 Uncovertebral joint osteophytes cause severe right C3-C4, severe right and moderate left C5-C6 and severe left and mild right C6-C7 neural foraminal narrowing. Facet joint degeneration is severe on the left at C2-C3, the right at C3-C4 and C4-C5, and is moderate bilateral and C7-T1.         EMG/NCS 12/23/18 (Dr. Noel Gerold and Dr. Claudie Leach): normal RNS in left ulnar and accessory nerve  EMG/NCS on 09/19/20 (Dr. Claudie Leach): no neuropathy or carpal tunnel syndrome      ASSESSMENT & PLAN:  Mrs. Felmlee is a 85 y.o. female with a pmhx of seropositive initially ocular (2017) now generalized myasthenia gravis (AchR binding and blocking +), breast cancer, interstitial cystitis, fibromyalgia, COPD, asthma. He last MG exacerbation was 2020 and received PLEX. She reports being in stable poor functionality and has fatigue which she acknowledges has multiple potential reasons for her increased fatigue and decreased endurance capacity including MG, deconditioning, fibromyalgia.      Although she has residual ptosis, her neck weakness and gait problem is likely partly from the cervical spinal stenosis. She is likely not a good surgical candidate for her comorbidity and advanced age, and she is not interested in the neck surgery at this time. Will repeat MRI C-spine and refer her to physical therapy for cervical spinal stenosis. Meanwhile, she is stable from MG standpoint, and will reduce the IVIG frequency from q2weeks to q3weeks.    Plan/Recommendations  - MRI C-spine without contrast  - Physical Therapy referral for gait and balance training for cervical spinal stenosis  - ChangeIVIG 60g (1g/kg) frequency to every 3 weeks ([place])  - Continue Azathioprine 75 mg BID (refilling today)    Note: greater than 40 minutes spent on this encounter in consultation and in coordination of care.    Patient was seen and discussed with Dr. Claudie Leach.    Para Skeans, MD  Neuromuscular Fellow    The patient was seen and examined with Dr. Para Skeans, Neuromuscular Medicine Fellow. I was present for the key portions of the history and physical examination. We formulated the assessment and plan together. I agree with the note above, which reflects the details of our evaluation.    Jake Shark, M.D. Ph.D.

## 2021-02-15 NOTE — Telephone Encounter
Pt was accommodated with a telephone visit later this afternoon.

## 2021-02-28 ENCOUNTER — Ambulatory Visit: Payer: BLUE CROSS/BLUE SHIELD

## 2021-03-02 ENCOUNTER — Telehealth: Payer: BLUE CROSS/BLUE SHIELD

## 2021-03-02 NOTE — Telephone Encounter
Faxed clinical notes from 02/14/2021 for continuation of IVIG treatment. Fax# [phone], received confirmation.

## 2021-03-07 ENCOUNTER — Ambulatory Visit: Payer: BLUE CROSS/BLUE SHIELD

## 2021-03-07 ENCOUNTER — Telehealth: Payer: BLUE CROSS/BLUE SHIELD

## 2021-03-07 MED ORDER — LOSARTAN POTASSIUM 25 MG PO TABS
ORAL_TABLET | 3 refills | Status: AC
Start: 2021-03-07 — End: ?

## 2021-03-07 NOTE — Telephone Encounter
Call Back Request      Reason for call back: Pt's Daughter Called to inform that Md had provided Pt with a lot of collection bottles and would like to know what to do with exactly. Daugther would like to speak with a Nurse     Daughter #[phone]    Please assist  Thank you         Any Symptoms:  []  Yes  []  No       If yes, what symptoms are you experiencing:    o Duration of symptoms (how long):    o Have you taken medication for symptoms (OTC or Rx):      If call was taken outside of clinic hours:    [] Patient or caller has been notified that this message was sent outside of normal clinic hours.     [] Patient or caller has been warm transferred to the physician's answering service. If applicable, patient or caller informed to please call us back if symptoms progress.  Patient or caller has been notified of the turnaround time of 1-2 business day(s).

## 2021-03-07 NOTE — Telephone Encounter
Called and spoke with patient's daughter [place] and asnwer all questiong on how to collect stool test.

## 2021-03-08 NOTE — Telephone Encounter
Please advise if you're requesting mammo?

## 2021-03-13 ENCOUNTER — Non-Acute Institutional Stay: Payer: BLUE CROSS/BLUE SHIELD

## 2021-03-13 DIAGNOSIS — C50811 Malignant neoplasm of overlapping sites of right female breast: Secondary | ICD-10-CM

## 2021-03-13 DIAGNOSIS — C50812 Malignant neoplasm of overlapping sites of left female breast: Secondary | ICD-10-CM

## 2021-03-13 DIAGNOSIS — Z170 Estrogen receptor positive status [ER+]: Secondary | ICD-10-CM

## 2021-03-14 ENCOUNTER — Non-Acute Institutional Stay: Payer: BLUE CROSS/BLUE SHIELD

## 2021-03-14 MED ADMIN — TECHNETIUM TC 99M TETROFOSMIN IV KIT: 8 | INTRAVENOUS | @ 23:00:00 | Stop: 2021-03-14

## 2021-03-14 MED ADMIN — TECHNETIUM TC 99M TETROFOSMIN IV KIT: 25 | INTRAVENOUS | @ 23:00:00 | Stop: 2021-03-14

## 2021-03-14 MED ADMIN — REGADENOSON 0.4 MG/5ML IV SOLN: .4 mg | INTRAVENOUS | @ 23:00:00 | Stop: 2021-03-14

## 2021-03-16 ENCOUNTER — Ambulatory Visit: Payer: BLUE CROSS/BLUE SHIELD | Attending: Geriatric Medicine

## 2021-03-16 DIAGNOSIS — Z170 Estrogen receptor positive status [ER+]: Secondary | ICD-10-CM

## 2021-03-16 DIAGNOSIS — C50912 Malignant neoplasm of unspecified site of left female breast: Secondary | ICD-10-CM

## 2021-03-16 DIAGNOSIS — N1832 Type 2 diabetes mellitus with stage 3b chronic kidney disease, without long-term current use of insulin (HCC/RAF): Secondary | ICD-10-CM

## 2021-03-16 DIAGNOSIS — E1122 Type 2 diabetes mellitus with diabetic chronic kidney disease: Secondary | ICD-10-CM

## 2021-03-16 DIAGNOSIS — N1831 Stage 3a chronic kidney disease (HCC/RAF): Secondary | ICD-10-CM

## 2021-03-16 DIAGNOSIS — C50911 Malignant neoplasm of unspecified site of right female breast: Secondary | ICD-10-CM

## 2021-03-16 NOTE — Progress Notes
OUTPATIENT GERIATRICS CLINIC NOTE    PATIENT:  [name]   MRN:  [id number]  DOB:  Feb 08, 1936  DATE OF SERVICE:  03/16/2021  PRIMARY CARE PHYSICIAN: York Ram., MD    CHIEF COMPLAINT:   No chief complaint on file.      [place] [facility]:  [employer]    Non[facility] Specialists:    Chaperone status:  No data recorded    HISTORY OF PRESENT ILLNESS     [name] is a 86 y.o. female who presents today for *follow up**. Patient is accompanied by *no one**. History today is per the patient,and review of available recent records in [facility] and Care Everywhere.     Patient is a(n) [x]  reliable []  unreliable historian and additional collateral information was obtained during the visit today from:       Multidisciplinary discussion with all CA MD's-->?does patient want curative bilateral mastectomy?  Patient agreed to re-imaging with Mammo and if CA is shrinking, to continue with hormonal treatment alone.    Saw Shie[facility]--Neuro--Feb 14, 2021--planning MRI C spine, PT, reduce IVIG to q 3 w, contininge azathioprine    Per chart review, pertinent medical history:  Past Medical History:   Diagnosis Date   ? Breast cancer (HCC/RAF) 12/20/2018     No past surgical history on file.    ALLERGIES     Allergies   Allergen Reactions   ? Beta Adrenergic Blockers Other (See Comments)     Avoid d/t Myasthenia Gravis  Avoid d/t Myasthenia Gravis     ? Levofloxacin Other (See Comments)     Flare of Myasthenia Gravis   ? Macrolides And Ketolides Other (See Comments)     Avoid d/t Myasthenia Gravis  Use with caution d/t Myasthenia Gravis  Use with caution d/t Myasthenia Gravis     ? Sulfa Antibiotics Other (See Comments)     May possibly have caused deafness in one ear  May possibly have caused deafness in one ear  other     ? Botulinum Toxins Other (See Comments)     Muscle weakness r/t Myasthenia Gravis  Muscle weakness r/t Myasthenia Gravis     ? Statins Other (See Comments)     Muscle aches  Muscle aches  Muscle aches     ? Plastic Tape Itching and Other (See Comments)     Turns the skin red where applied        MEDICATIONS     Personally reviewed.    No outpatient medications have been marked as taking for the 03/16/21 encounter (Appointment) with York Ram., MD.       SOCIAL HISTORY     Social History     Social History Narrative   ? Not on file        FUNCTIONAL STATUS     BADLs:  IADLs:    Ambulates with   Falls in past year:  Afraid of falling:    GERIATRIC REVIEW OF SYSTEMS     Vision:    []   glasses     []  legally blind  Hearing: []  hearing aids    Nutrition: []  normal   []  impaired  []  vegan  []  vegetarian  []  low salt  []  low-carb   Swallowing: []  impaired   Dentures:   []  yes    Depression:  []  yes   Cognition:     Incontinence: [] urine  []  fecal  []  urine and fecal      ADVANCED  CARE PLANNING     Advanced directives on file: []  No  []   Yes ? Completed:     Medical DPOA on file:   []   Yes ?   []   No ? Patient designates ** * to be their surrogate medical decision-maker.         HEALTH CARE MAINTENANCE     IMMUNIZATIONS:   Immunization History   Administered Date(s) Administered   ? COVID-19, mRNA, (Pfizer - Purple Cap) 30 mcg/0.3 mL 03/11/2019, 04/01/2019, 10/29/2019   ? COVID-19, mRNA, tris-sucrose (Pfizer - [employer]) 30 mcg/0.3 mL 06/20/2020   ? DTaP 12/31/2015   ? influenza vaccine IM quadrivalent (Fluzone Quad) MDV (54 months of age and older) 11/26/2016   ? influenza vaccine IM quadrivalent adjuvanted (FluAD Quad) (PF) SYR (31 years of age and older) 11/05/2018, 12/01/2019   ? influenza, unspecified formulation 04/01/2019, 04/01/2019, 12/01/2019, 12/01/2019, 12/01/2019, 12/01/2019, 11/25/2020, 11/25/2020   ? pneumococcal polysaccharide vaccine 23-valent (Pneumovax) 01/10/2019       Mammogram:  DXA:  PAP:  Colonoscopy:      PHYSICAL EXAM     BP 145/81  ~ Pulse 92  ~ Temp 36.7 ?C (98 ?F)  ~ Resp 19  ~ Ht 5' 5'' (1.651 m)  ~ Wt 146 lb 8 oz (66.5 kg)  ~ SpO2 98%  ~ BMI 24.38 kg/m?   Wt Readings from Last 3 Encounters: 02/14/21 147 lb (66.7 kg)   02/10/21 150 lb (68 kg)   01/10/21 148 lb (67.1 kg)         System Check if normal Positive or additional negative findings   GEN  [x]  NAD     Eyes  []  Conj/Lids []  Pupils  []  Fundi   []  Sclerae []  EOM     ENT  []  External ears   []  Otoscopy   []  Gross Hearing    []   External nose   []  Nasal mucosa   []  Lips/teeth/gums    []  Oropharynx    []  Mucus membranes      Neck  [x]  Inspection/palpation    [x]  Thyroid     Resp  [x]  Effort    [x]  Auscultation       CV  []  Rhythm/rate   [x]  Murmurs   [x]  Edema   []  JVP non-elevated    Normal pulses:   []  Radial []  Femoral  []  Pedal     Breast  []  Inspection []  Palpation     GI  []  Bowel sounds    [x]  Nontender   [x]  No distension    []  No rebound or guarding   []  No masses   []  Liver/spleen    []  Rectal     GU  F:  []  External []  vaginal wall         []  Cervix     []  mucus        []  Uterus    []  Adnexa   M:  []  Scrotum []  Penis         []  Prostate     Lymph  [x]  Cervical []  supraclavicular     [x]  Axillae   []  Groin/inguinal     MSK []  Gait  []  Back     Specify site examined:    []  Inspect/palp []  ROM   []  Stability []  Strength/tone []  Used arms to push up from seated to standing position   Assistive device:  []  single point cane []  quad cane  []  FWW []   rollator walker      Skin  []  Inspection []  Palpation     Neuro  []  Alert and oriented     []  CN2-12 intact grossly   []  DTR      []  Muscle strength      []  Sensation   []  Pronator drift   []  Finger to Nose/Heel to Shin   []  Romberg     Psych  []  Insight/judgement     []  Mood/affect    []  Gross cognition            LABS/STUDIES     LABS:  Lab Results   Component Value Date    WBC 6.40 12/20/2020    WBC 10.43 (H) 12/19/2020    WBC 13.03 (H) 12/18/2020    HGB 9.7 (L) 12/20/2020    HGB 10.8 (L) 12/19/2020    HGB 11.4 (L) 12/18/2020    MCV 98.6 12/20/2020    PLT 278 12/20/2020    PLT 332 12/19/2020    PLT 333 12/18/2020     Lab Results   Component Value Date    NA 139 12/20/2020    NA 137 12/19/2020 NA 136 12/18/2020    K 3.9 12/20/2020    K 4.2 12/19/2020    K 3.8 12/18/2020    CREAT 1.16 12/20/2020    CREAT 1.12 12/19/2020    CREAT 1.12 12/18/2020    GFRESTNOAA 29 06/09/2020    GFRESTNOAA 28 05/18/2020    GFRESTNOAA 30 03/23/2020    GFRESTAA 34 06/09/2020    GFRESTAA 33 05/18/2020    GFRESTAA 35 03/23/2020    CALCIUM 8.5 (L) 12/20/2020    CALCIUM 9.3 12/19/2020    CALCIUM 9.6 12/18/2020     Lab Results   Component Value Date    ALT 11 12/18/2020    ALT 7 10/28/2020    AST 28 12/18/2020    AST 20 10/28/2020    ALKPHOS 76 12/18/2020    BILITOT 0.4 12/18/2020    ALBUMIN 3.8 (L) 12/18/2020    ALBUMIN 4.0 10/28/2020     Lab Results   Component Value Date    TSH 2.8 03/23/2020    TSH 1.00 12/17/2018     Lab Results   Component Value Date    HGBA1C 5.2 03/23/2020    HGBA1C 4.9 09/22/2019     No results found for: CHOL, CHOLDLCAL  No results found for: CHOLDLQ  No results found for: [facility]  Lab Results   Component Value Date    FE 87 07/14/2019    FERRITIN 57 07/14/2019    FOLATE 9.6 11/25/2019    TIBC 356 07/14/2019     Lab Results   Component Value Date    VITAMINB12 3,994 (H) 11/25/2019    VITAMINB12 217 (L) 09/22/2019     Lab Results   Component Value Date    BNP 92 09/22/2019     No results found for: PSATOTAL      STUDIES:    EKG  This data element was independently reviewed and interpreted by me     NSR NO ST or T changes    XR CHEST PA LAT 2V  September 22, 2019?  COMPARISON: KUB December 17, 2018  ?  History: sob  ?  FINDINGS:  ?  Lungs: Clear  Heart/aorta: Normal heart size. The thoracic aorta is mildly calcified, tortuous and ectatic.  Adenopathy: None  Pleura: No effusion  Bones and Chest wall: No acute bony or body wall  findings. Osteopenia and bony maturational changes. Right upper quadrant cholecystectomy clips stable from October 2020  ?  ?  IMPRESSION:  ?  No acute findings or abnormalities related to provided history.    ASSESSMENT and PLAN     Suleika Donavan is a 86 y.o. female who presents today for *follow up**.          #HTN--Increased nifedipine CR 60 mg daily. Continue losartan 50 mg.  BP Better.    #CKD--stage 4--chronic--CTM--control BP as tolerated by symptoms of orthostasis and fatigue    #Aortic Calcification--noted on chest imaging     September 22, 2019?       --Not Worsening.  Chronic.  Will monitor and treat underlying risk factors with medical and lifestyle interventions as warranted by balance between benefits and burdens.  #allergic rhinitis--trial flonase  #Macrocytosis--  #Deconditioning--initiate home PT  #GERD--s/p Nisan fundoplication  #COPD--mixed--per PFT's from [name].--albuterol prn.  May resume other inhalders.  #s/p rectocele repair  #s/p cholecystectomy  #Immunosupressed status--medication related.  Chronic. CTM  #Nocturnal hypoxemia--presumably related to MG crisis but has history of COPD. ?Will order nocturnal home O2 study and consider PFT's/Pulmonary evaluation once settled down. ?May need to restart BREO inhaler.  #Myasthenia Gravis--s/p plasmapheresis x5 October 2020, no Thymoma, on Azathioprine 150 daily and mestinon 60 tid.??Saw?Dr. Enedina Finner at [facility]. ?Was considering Soliris.??Ordering IVIG and MRI.  Encourage ongoing neurology follow up.   #IBS--titrate miralax to comfort. ?  #Interstitial cystitis--on daily Trimethoprim 100 for UTI prophylaxis. ?Using bladder instillations prn. OFF topical estrogen due to advice from neurologist re: MG.  To see Dr. Lorenz Coaster.  Suspect recent flare related to breast CA re-diagnosis.  Doubt pseudomonas bactiuria is pathogen at present.  WIll complete current meropenum.  #Major Depression--active but appears better s/p  increase Duloxetine to 60 mg daily.   #Allergic rhinitis--previously on nasal steroids.  #Breast CA--Now with bx+.  To see [place] surgery Friday. ?Patient declined surgery in 2019 and experienced dry mouth from Tamoxifen. ?Was considering Anastrozole.Per records from [facility]:   ''07/20/2017 Screening detected right breast calcifications and distortion. The distortion was in the upper outer quadrant: Biopsy DCIS intermediate grade with CSL; calcifications UIQ: 2.8 cm: Biopsy DCIS+ ALH +CSL; 4 cm apart, axilla negative, Tis NX stage 0''  Await MRI but patient continues to decline surgery.  #DM with CKD--stabe 3b--based on labs from [name]. ?FOllow here. ?If BP permits, may consider ARB BUT patient with h/o angioedema so will ONLY initiate this agent with great caution.  Refer to Ophthalmology.  #OA knees--previously getting hyaluronic acid injections.  #Anxiety  #Benzodiazepine dependence--Weaning to off.  #Fibromyalgia--duloxetine as above.   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  #Statin intolerance      FOLLOW-UP     RTC     Future Appointments   Date Time Provider Department Center   03/16/2021  3:00 PM York Ram., MD GERI IMS 420 MEDICINE   05/03/2021  1:00 PM NO[facility] MR02 3T MRINOHO DT [facility] RAD   05/03/2021  2:00 PM NO[facility] MR02 3T [facility]   08/15/2021 11:30 AM [facility]U [facility] NEUMUS[facility] B200 NEUROLOGY         The above plan of care, diagnosis, orders, and follow-up were discussed with the patient and/or surrogate. Questions related to this recommended plan of care were answered.    ANALYSIS OF DATA (Needs to meet 1 category for moderate and 2 categories for high LOS)     I have:  Category 1 (Needs 3 for moderate and high LOS)     []  Reviewed []  1 []  2 []  ? 3 unique laboratory, radiology, and/or diagnostic tests noted below    Test/Study:  on date .    []  Reviewed []  1 []  2 []  ? 3 prior external notes and incorporated into patient assessment    I reviewed Dr. 's note in specialty  from date .    []  Discussed management or test interpretation with external provider(s) as noted      []  Ordered []  1 []  2 []  ? 3 unique laboratory, radiology, and/or diagnostic tests noted in A&P    []  Obtained history from independent historian:       Category 2  []  Independently interpreted the test    Category 3  []  Discussed management or test interpretation with external provider(s) as noted      INTERACTION COMPLEXITY and SOCIAL DETERMINANTS of HEALTH       PROBLEM COMPLEXITY   []  New problem with uncertain diagnosis or prognosis (moderate)   []  Multiple stable chronic problems (moderate)   []  Chronic problem not stable - not controlled, symptomatic, or worsening (moderate)   []  Severe exacerbation of chronic problem (high)   []  New or chronic problem that poses threat to life or bodily function (high)     MANAGEMENT COMPLEXITY   []  Old or external/outside records reviewed   []  Discussion with alternate (proxy) if patient with impaired communication / comprehension ability (e.g., dementia, aphasia, severe hearing loss).   []  Repeated questions (or disagreement) between patient and/among caregivers/family during the visit.  []  Caregiver/patient emotions/behavior/beliefs interfering with implementation of treatment plan.  []  Independent interpretation of test (EKG, Chest XRay)   []  Discussion of case with a consultant physician     RISK LEVEL   []  Prescription drug management (moderate)   []  Minor surgery with CV risk factors or elective major surgery (moderate)   []  Dx or Rx significantly limited by SDoH (inadequate housing, living alone, poor health care access, inappropriate diet; low literacy) (moderate)   []  Major surgery - elective with CV risk factors or emergent (high)   []  Need for hospitalization (high)   []  New DNR or de-escalation of care (high)      SDoH  The diagnosis or treatment of said conditions is significantly limited by the following social determinants of health:  []  Z59.0 Homelessness  []  Z59.1 Inadequate housing  []  Z59.2 Discord with neighbors, lodgers and landlord  []  Z60.2 Problems related to living alone  []  Z59.8 Other problems related to housing and economic circumstances  []  Z59.4 Lack of adequate food and safe drinking water  []  Z59.6 Low income  []  Z59.7 Insufficient social insurance and welfare support  []  Z59.9 Problems related to housing and economic circumstances, unspecified  []  Z75.3 Unavailability and inaccessibility of health care facilities  []  Z75.4 Unavailability and inaccessibility of other helping agencies  []  Z72.4 Inappropriate diet and eating habits  []  Z62.820 Parent-biological child conflict  []  Z63.8 Other specified problems related to primary support group  []  Z55.0 Illiteracy and low level literacy   []  Z56.9 Unspecified problems related to employment        If Billing Based on Time:     I performed the following items on the day of service:    [x]  Preparing to see the patient (e.g., review of tests)  [x]  Obtaining and/or reviewing separately obtained history   [x]  Performing a medically appropriate examination  and/or evaluation   [x]  Counseling and educating the patient/family/caregiver   [x]  Ordering medications, tests, or procedures  [x]  Referring and communicating with other healthcare professionals (when not separately reported)  [x]  Documenting clinical information in the EHR  []  Independently interpreting results and communicating results to patient/family/caregiver    I spent the following total amount of time on these tasks on the day of service:  New Patient     Established Patient  []  15-29 minutes - 99202    []  up to 9 minutes - 99211  []  30-44 minutes - 99203     []  10-19 minutes - 99212   []  45-59 minutes - 99204      []  20-29 minutes - 99213   []  60-74 minutes - 99205   [x]  30- minutes - 99214         []  40-55 minutes - 99215    []  I spent an additional ** * 15-minute-increment(s) for a total of * ** minutes on these tasks on the day of service. [phone] for each additional 15 minutes.)    Author: York Ram, MD 03/16/2021

## 2021-03-17 LAB — Hgb A1c: HGB A1C - HPLC: 5.1 (ref <5.7)

## 2021-03-17 LAB — Comprehensive Metabolic Panel
ALKALINE PHOSPHATASE: 77 U/L (ref 37–113)
TOTAL CO2: 25 mmol/L (ref 20–30)
TOTAL PROTEIN: 8.2 g/dL (ref 6.1–8.2)

## 2021-03-17 LAB — TSH with reflex FT4, FT3: TSH: 1.5 u[IU]/mL (ref 0.3–4.7)

## 2021-03-17 LAB — CBC: HEMATOCRIT: 36.4 (ref 34.9–45.2)

## 2021-03-24 ENCOUNTER — Telehealth: Payer: BLUE CROSS/BLUE SHIELD

## 2021-03-29 ENCOUNTER — Ambulatory Visit: Payer: BLUE CROSS/BLUE SHIELD

## 2021-04-03 ENCOUNTER — Telehealth: Payer: BLUE CROSS/BLUE SHIELD

## 2021-04-18 MED ORDER — PYRIDOSTIGMINE BROMIDE 60 MG PO TABS
ORAL_TABLET
Start: 2021-04-18 — End: ?

## 2021-04-19 MED ORDER — DEXILANT 60 MG PO CPDR
60 mg | ORAL_CAPSULE | Freq: Every day | ORAL | 3 refills | Status: AC
Start: 2021-04-19 — End: ?

## 2021-04-21 MED ORDER — PYRIDOSTIGMINE BROMIDE 60 MG PO TABS
ORAL_TABLET
Start: 2021-04-21 — End: ?

## 2021-04-26 ENCOUNTER — Ambulatory Visit: Payer: BLUE CROSS/BLUE SHIELD

## 2021-04-26 DIAGNOSIS — R9439 Abnormal result of other cardiovascular function study: Secondary | ICD-10-CM

## 2021-04-26 DIAGNOSIS — R0609 Other forms of dyspnea: Secondary | ICD-10-CM

## 2021-04-26 DIAGNOSIS — I10 Essential (primary) hypertension: Secondary | ICD-10-CM

## 2021-04-26 MED ORDER — LOSARTAN POTASSIUM 50 MG PO TABS
50 mg | ORAL_TABLET | Freq: Every day | ORAL | 3 refills | Status: AC
Start: 2021-04-26 — End: ?

## 2021-04-26 NOTE — Consults
CARDIOLOGY NOTE      PATIENT: [name]   MRN: [id number]   DOB: 1935/12/14   DATE OF SERVICE: 04/26/2021    PCP:  York Ram., MD      Problem List Items Addressed This Visit     Aortic calcification (HCC/RAF)   Other Visit Diagnoses     Essential hypertension    -  Primary    DOE (dyspnea on exertion)        Abnormal stress test                Interval History:     a/w [name]  The patient feels SOB/dyspnea       Prior Encounter:     This is a 86 y.o. female who is here for cardiac evaluation.  Indication:   SOB    The patient is a/w friend, [name]    Pt was recently hospitalized with SOB and chest pain.   Pt ambulates with a walker for precautionary reasons given history of falls.   With activity, the patient reports SOB/dypnea. She walks the dog 1/2 block  Her symptoms have been ongoing for the past 3 years, progressive worsening.   The patient notes allergies and asthma.   About 1 year ago, she could walk 1.5 blocks         Past Medical History:   Diagnosis Date   ? Breast cancer (HCC/RAF) 12/20/2018       No past surgical history on file.    Allergies:   Allergies   Allergen Reactions   ? Beta Adrenergic Blockers Other (See Comments)     Avoid d/t Myasthenia Gravis  Avoid d/t Myasthenia Gravis     ? Levofloxacin Other (See Comments)     Flare of Myasthenia Gravis   ? Macrolides And Ketolides Other (See Comments)     Avoid d/t Myasthenia Gravis  Use with caution d/t Myasthenia Gravis  Use with caution d/t Myasthenia Gravis     ? Sulfa Antibiotics Other (See Comments)     May possibly have caused deafness in one ear  May possibly have caused deafness in one ear  other     ? Botulinum Toxins Other (See Comments)     Muscle weakness r/t Myasthenia Gravis  Muscle weakness r/t Myasthenia Gravis     ? Statins Other (See Comments)     Muscle aches  Muscle aches  Muscle aches     ? Plastic Tape Itching and Other (See Comments)     Turns the skin red where applied       Social History     Socioeconomic History   ? Marital status: Widowed   Tobacco Use   ? Smoking status: Former     Types: Cigarettes     Quit date: 1985     Years since quitting: 38.1   ? Smokeless tobacco: Former   Substance and Sexual Activity   ? Alcohol use: Not Currently   ? Drug use: Never       No family history on file.  Adopted     Objective:     BP 148/76  ~ Pulse 89  ~ Wt 143 lb 3.2 oz (65 kg)  ~ SpO2 97%  ~ BMI 23.83 kg/m?     Vitals:    04/26/21 1344   BP: 148/76   Pulse: 89   SpO2: 97%   Weight: 143 lb 3.2 oz (65 kg)       Body mass  index is 23.83 kg/m?Marland Kitchen    Wt Readings from Last 3 Encounters:   04/26/21 143 lb 3.2 oz (65 kg)   03/16/21 146 lb 8 oz (66.5 kg)   02/14/21 147 lb (66.7 kg)          Physical Exam:   General Exam: no apparent distress.     Neurologic/Psychiatric: alert and oriented. Affect is appropriate.   HEENT: Sclerae are anicteric. There is no epistaxis.   Cardiac: regular in rate and rhythm.     Respiratory: non-labored breathing. No use of accessory muscles. No wheeze.    Abdomen: bowel sounds are present. Abdomen is soft. No rebound.    Extremities: no edema. No cyanosis or clubbing.     Integument: no petechiae. No acute rash.         Laboratory:  Lab Results   Component Value Date    NA 136 03/16/2021    K 4.5 03/16/2021    CL 101 03/16/2021    CO2 25 03/16/2021    BUN 24 (H) 03/16/2021    CREAT 1.37 (H) 03/16/2021    GLUCOSE 95 03/16/2021    CALCIUM 9.4 03/16/2021     Lab Results   Component Value Date    TROPONIN 0.05 (H) 12/19/2020    TROPONIN 0.07 (H) 12/19/2020    TROPONIN 0.06 (H) 12/18/2020     Lab Results   Component Value Date    BNP 92 09/22/2019     Lab Results   Component Value Date    WBC 6.14 03/16/2021    HGB 12.4 03/16/2021    HCT 36.4 03/16/2021    MCV 98.9 (H) 03/16/2021    PLT 434 (H) 03/16/2021     No results found for: CHOL, CHOLHDL, CHOLDLCAL, CHOLDLQ, TRIGLY  Lab Results   Component Value Date    PT 12.9 12/18/2020    INR 1.0 12/18/2020     No components found for: HA1C  Lab Results   Component Value Date TSH 1.5 03/16/2021       Patient Active Problem List   Diagnosis   ? Acute medial meniscal tear, left, subsequent encounter   ? Angioedema   ? Anxiety   ? Asthmatic bronchitis, mild persistent, with acute exacerbation   ? Chronic cholecystitis   ? Chronic interstitial cystitis   ? CKD (chronic kidney disease), stage III (HCC/RAF)   ? COPD mixed type (HCC/RAF)   ? Major depression, recurrent, chronic (HCC/RAF)   ? Diabetes mellitus (HCC/RAF)   ? Esophageal reflux   ? Eustachian tube dysfunction   ? Fibromyalgia   ? H/O arthroscopy   ? Insomnia   ? Myasthenia gravis with exacerbation (HCC/RAF)   ? Nocturnal hypoxemia   ? Osteoarthritis of knee   ? Renal cyst   ? Seasonal and perennial allergic rhinitis   ? Spondylosis without myelopathy or radiculopathy, lumbar region   ? Urethral caruncle   ? Vaginal atrophy   ? Benzodiazepine dependence (HCC/RAF)   ? Breast cancer (HCC/RAF)   ? Refractive error   ? Ptosis of eyelid   ? Pseudophakia of both eyes   ? Aortic calcification (HCC/RAF)   ? HTN (hypertension)   ? Immunosuppressed status (HCC/RAF)   ? Acute chest pain   ? Atypical chest pain       Outpatient Medications Prior to Visit   Medication Sig   ? Albuterol Sulfate AEPB Takes 1 to 2 puffs every 4 hours as needed for wheezing or SOB Inhale Takes 1  to 2 puffs every 4 hours as needed for wheezing or SOB ..   ? anastrozole 1 mg tablet Take 1 tablet (1 mg total) by mouth daily.   ? ascorbic acid 500 mg tablet Take 1 tablet (500 mg total) by mouth two (2) times daily with meals.   ? azaTHIOprine 50 mg tablet Take one and one-half tablet (75 mg total) by mouth two (2) times daily.   ? buPROPion, SR, (WELLBUTRIN SR) 200 mg 12 hr tablet Take 1 tablet (200 mg total) by mouth two (2) times daily.   ? clonazePAM 1 mg tablet 0.5 mg in am and 1 mg in pm.   ? DEXILANT 60 MG DR capsule TAKE 1 CAPSULE (60 MG TOTAL) BY MOUTH DAILY   ? DULoxetine 20 mg DR capsule Take 3 capsules (60 mg total) by mouth daily.   ? estradiol (ESTRACE) 0.1 mg/g vaginal cream Place 1 g vaginally three (3) times a week.   ? fluticasone-vilanterol (BREO ELLIPTA) 100-25 mcg/inh inhaler Inhale 1 puff daily.   ? GAMUNEX-C 20 GM/200ML infusion    ? meclizine 25 mg tablet Take 1 tablet (25 mg total) by mouth three (3) times daily as needed for Dizziness.   ? montelukast 10 mg tablet Take 1 tablet (10 mg total) by mouth as needed for.   ? trimethoprim 100 mg tablet Take 1 tablet (100 mg total) by mouth daily.   ? LOSARTAN 25 mg tablet TAKE 1 TABLET (25 MG TOTAL) BY MOUTH DAILY     Facility-Administered Medications Prior to Visit   Medication   ? lidocaine 2% inj 5 mL   ? lidocaine 2% inj 5 mL   ? lidocaine-EPINEPHrine 1 %-1:100000 inj 4.8 mL   ? lidocaine-EPINEPHrine 1 %-1:100000 inj 5 mL       Current Outpatient Medications   Medication Sig   ? Albuterol Sulfate AEPB Takes 1 to 2 puffs every 4 hours as needed for wheezing or SOB Inhale Takes 1 to 2 puffs every 4 hours as needed for wheezing or SOB ..   ? anastrozole 1 mg tablet Take 1 tablet (1 mg total) by mouth daily.   ? ascorbic acid 500 mg tablet Take 1 tablet (500 mg total) by mouth two (2) times daily with meals.   ? azaTHIOprine 50 mg tablet Take one and one-half tablet (75 mg total) by mouth two (2) times daily.   ? buPROPion, SR, (WELLBUTRIN SR) 200 mg 12 hr tablet Take 1 tablet (200 mg total) by mouth two (2) times daily.   ? clonazePAM 1 mg tablet 0.5 mg in am and 1 mg in pm.   ? DEXILANT 60 MG DR capsule TAKE 1 CAPSULE (60 MG TOTAL) BY MOUTH DAILY   ? DULoxetine 20 mg DR capsule Take 3 capsules (60 mg total) by mouth daily.   ? estradiol (ESTRACE) 0.1 mg/g vaginal cream Place 1 g vaginally three (3) times a week.   ? fluticasone-vilanterol (BREO ELLIPTA) 100-25 mcg/inh inhaler Inhale 1 puff daily.   ? GAMUNEX-C 20 GM/200ML infusion    ? losartan 50 mg tablet Take 1 tablet (50 mg total) by mouth daily.   ? meclizine 25 mg tablet Take 1 tablet (25 mg total) by mouth three (3) times daily as needed for Dizziness.   ? montelukast 10 mg tablet Take 1 tablet (10 mg total) by mouth as needed for.   ? trimethoprim 100 mg tablet Take 1 tablet (100 mg total) by mouth daily.   ? [  DISCONTINUED] LOSARTAN 25 mg tablet TAKE 1 TABLET (25 MG TOTAL) BY MOUTH DAILY     No current facility-administered medications for this visit.     Facility-Administered Medications Ordered in Other Visits   Medication Dose Route Frequency   ? lidocaine 2% inj 5 mL  5 mL Intradermal Once   ? lidocaine 2% inj 5 mL  5 mL Intradermal Once   ? lidocaine-EPINEPHrine 1 %-1:100000 inj 4.8 mL  4.8 mL Intradermal Once   ? lidocaine-EPINEPHrine 1 %-1:100000 inj 5 mL  5 mL Intradermal Once             2018 ACC/AHA guidelines recommends high-intensity statin because of ASCVD diagnosis. Patient is intolerant to statin. Consider non-statin Rx or referral.  10-Year ASCVD risk calculator does not apply because patients with ASCVD are recommended to take a high-intensity statin.  as of 2:04 PM on 04/26/2021.  10-Year ASCVD risk with optimal risk factors cannot be calculated.  Values used to calculate ASCVD score:  Age: 86 y.o. Cannot calculate risk because age is not between 3 and 70 years old.  Gender: Female Race: White: Not Listed  Cannot calculate risk because HDL cholesterol not documented within the past 5 years.    Cannot calculate risk because Total cholesterol not documented within the past 5 years.    LDL cholesterol not documented within the past 5 years.    Systolic BP: 148 mm Hg. BP was measured on 04/26/2021.  The patient is being treated with a medication that influences SBP.  The patient is currently not a smoker.  The patient has a diagnosis of diabetes.  Click here for the [facility] ASCVD Cardiovascular Risk Estimator Plus tool Office manager).      Assessment:     Aline Wesche is 86 y.o. female with    Cardiomyopathy, EF 35-40%. Echo in 2023    - avoiding BBlockers due to myasthenia gravis    Abnormal Myoview in 2023: There is a moderate-severe perfusion defect in the distal anteroseptal wall and apex, suggestive for MI.  SOB/dyspnea. Uses oxygen concentrator  Abnormal EKG with LBBB and PACs  Former tobacco use  Subtle calcifications of the aortic root. Moderately calcified thoracic aorta and its main branch vessels.  Intolerant to Statins    Plan:     Increase Losartan to 50 mg  Start ASA or Plavix pending input from Neuro (pt is concerned about Myasthenia Gravis)  Check lipids, considering therapy.  Pending clinical course and goals of care, may consider invasive cardiac  Cath      Orders Placed This Encounter   ? Lipid Panel   ? Chol,LDL,Quant   ? ECG 12-Lead Clinic Performed   ? losartan 50 mg tablet           Return in about 3 weeks (around 05/17/2021).      There are no Patient Instructions on file for this visit.    Future Appointments   Date Time Provider Department Center   05/03/2021  1:00 PM [facility] MR02 3T [facility] RAD   05/03/2021  2:00 PM [facility] MR02 3T [facility]   05/11/2021  1:20 PM York Ram., MD GERI IMS 420 MEDICINE   08/15/2021 11:30 AM NEU MG CLINIC [facility] NEUMUSC B200 NEUROLOGY         Author: Clelia Croft, MD

## 2021-05-01 ENCOUNTER — Non-Acute Institutional Stay: Payer: BLUE CROSS/BLUE SHIELD

## 2021-05-01 ENCOUNTER — Telehealth: Payer: BLUE CROSS/BLUE SHIELD

## 2021-05-01 MED ORDER — ASPIRIN EC 81 MG PO TBEC
81 mg | ORAL_TABLET | Freq: Every day | ORAL | 3 refills | Status: AC
Start: 2021-05-01 — End: ?

## 2021-05-02 NOTE — Telephone Encounter
Spoke with patient relayed message below.   Thank you     Please let pt know that:   We have communicated with her doctor.   Pt should start daily Aspirin 81 mg (which is baby Aspirin)

## 2021-05-03 ENCOUNTER — Inpatient Hospital Stay: Payer: BLUE CROSS/BLUE SHIELD | Attending: Hematology & Oncology

## 2021-05-03 ENCOUNTER — Inpatient Hospital Stay: Payer: BLUE CROSS/BLUE SHIELD | Attending: Neurology

## 2021-05-03 DIAGNOSIS — C50811 Malignant neoplasm of overlapping sites of right female breast: Secondary | ICD-10-CM

## 2021-05-03 DIAGNOSIS — Z170 Estrogen receptor positive status [ER+]: Secondary | ICD-10-CM

## 2021-05-03 DIAGNOSIS — C50812 Malignant neoplasm of overlapping sites of left female breast: Secondary | ICD-10-CM

## 2021-05-03 DIAGNOSIS — G959 Disease of spinal cord, unspecified: Secondary | ICD-10-CM

## 2021-05-03 MED ADMIN — GADOBUTROL 1 MMOL/ML IV SOLN: 7.5 mL | INTRAVENOUS | @ 22:00:00 | Stop: 2021-05-03 | NDC 50419032511

## 2021-05-11 ENCOUNTER — Ambulatory Visit: Payer: BLUE CROSS/BLUE SHIELD | Attending: Geriatric Medicine

## 2021-05-11 DIAGNOSIS — G959 Disease of spinal cord, unspecified: Secondary | ICD-10-CM

## 2021-05-11 DIAGNOSIS — I5020 Unspecified systolic (congestive) heart failure: Secondary | ICD-10-CM

## 2021-05-11 NOTE — Progress Notes
OUTPATIENT GERIATRICS CLINIC NOTE    PATIENT:  [name]   MRN:  [id number]  DOB:  Mar 24, 1935  DATE OF SERVICE:  05/11/2021  PRIMARY CARE PHYSICIAN: York Ram., MD    CHIEF COMPLAINT:   No chief complaint on file.      [facility]:  [facility]    [facility]:    Chaperone status:  No data recorded    HISTORY OF PRESENT ILLNESS     [name] is a 86 y.o. female who presents today for *follow up**. Patient is accompanied by *no one**. History today is per the patient,and review of available recent records in [facility] and Care Everywhere.     Patient is a(n) [x]  reliable []  unreliable historian and additional collateral information was obtained during the visit today from:         Still with diarrhea.  Didn't submit stool samples    Per chart review, pertinent medical history:  Past Medical History:   Diagnosis Date   ? Breast cancer (HCC/RAF) 12/20/2018     No past surgical history on file.    ALLERGIES     Allergies   Allergen Reactions   ? Beta Adrenergic Blockers Other (See Comments)     Avoid d/t Myasthenia Gravis  Avoid d/t Myasthenia Gravis     ? Levofloxacin Other (See Comments)     Flare of Myasthenia Gravis   ? Macrolides And Ketolides Other (See Comments)     Avoid d/t Myasthenia Gravis  Use with caution d/t Myasthenia Gravis  Use with caution d/t Myasthenia Gravis     ? Sulfa Antibiotics Other (See Comments)     May possibly have caused deafness in one ear  May possibly have caused deafness in one ear  other     ? Botulinum Toxins Other (See Comments)     Muscle weakness r/t Myasthenia Gravis  Muscle weakness r/t Myasthenia Gravis     ? Statins Other (See Comments)     Muscle aches  Muscle aches  Muscle aches     ? Plastic Tape Itching and Other (See Comments)     Turns the skin red where applied        MEDICATIONS     Personally reviewed.    No outpatient medications have been marked as taking for the 05/11/21 encounter (Appointment) with York Ram., MD. SOCIAL HISTORY     Social History     Social History Narrative   ? Not on file        FUNCTIONAL STATUS     BADLs:  IADLs:    Ambulates with   Falls in past year:  Afraid of falling:    GERIATRIC REVIEW OF SYSTEMS     Vision:    []   glasses     []  legally blind  Hearing: []  hearing aids    Nutrition: []  normal   []  impaired  []  vegan  []  vegetarian  []  low salt  []  low-carb   Swallowing: []  impaired   Dentures:   []  yes    Depression:  []  yes   Cognition:     Incontinence: [] urine  []  fecal  []  urine and fecal      ADVANCED CARE PLANNING     Advanced directives on file: []  No  []   Yes ? Completed:     Medical DPOA on file:   []   Yes ?   []   No ? Patient designates ** * to be  their surrogate medical decision-maker.         HEALTH CARE MAINTENANCE     IMMUNIZATIONS:   Immunization History   Administered Date(s) Administered   ? COVID-19, mRNA, (Pfizer - Purple Cap) 30 mcg/0.3 mL 03/11/2019, 04/01/2019, 10/29/2019   ? COVID-19, mRNA, tris-sucrose (Pfizer - [employer]) 30 mcg/0.3 mL 06/20/2020   ? DTaP 12/31/2015   ? influenza vaccine IM quadrivalent (Fluzone Quad) MDV (31 months of age and older) 11/26/2016   ? influenza vaccine IM quadrivalent adjuvanted (FluAD Quad) (PF) SYR (16 years of age and older) 11/05/2018, 12/01/2019   ? influenza, unspecified formulation 04/01/2019, 04/01/2019, 12/01/2019, 12/01/2019, 12/01/2019, 12/01/2019, 11/25/2020, 11/25/2020   ? pneumococcal polysaccharide vaccine 23-valent (Pneumovax) 01/10/2019       Mammogram:  DXA:  PAP:  Colonoscopy:      PHYSICAL EXAM     BP 149/72  ~ Pulse (!) 106  ~ Temp 36.6 ?C (97.9 ?F) (Temporal)  ~ Resp 19  ~ Ht 5' 5'' (1.651 m)  ~ Wt 141 lb 11.2 oz (64.3 kg)  ~ SpO2 93%  ~ BMI 23.58 kg/m?   Wt Readings from Last 3 Encounters:   05/11/21 141 lb 11.2 oz (64.3 kg)   04/26/21 143 lb 3.2 oz (65 kg)   03/16/21 146 lb 8 oz (66.5 kg)         System Check if normal Positive or additional negative findings   GEN  [x]  NAD     Eyes  []  Conj/Lids []  Pupils  []  Fundi   []  Sclerae []  EOM     ENT  []  External ears   []  Otoscopy   []  Gross Hearing    []   External nose   []  Nasal mucosa   []  Lips/teeth/gums    []  Oropharynx    []  Mucus membranes      Neck  []  Inspection/palpation    []  Thyroid     Resp  []  Effort    []  Auscultation       CV  []  Rhythm/rate   []  Murmurs   []  Edema   []  JVP non-elevated    Normal pulses:   []  Radial []  Femoral  []  Pedal     Breast  []  Inspection []  Palpation     GI  []  Bowel sounds    []  Nontender   []  No distension    []  No rebound or guarding   []  No masses   []  Liver/spleen    []  Rectal     GU  F:  []  External []  vaginal wall         []  Cervix     []  mucus        []  Uterus    []  Adnexa   M:  []  Scrotum []  Penis         []  Prostate     Lymph  []  Cervical []  supraclavicular     []  Axillae   []  Groin/inguinal     MSK []  Gait  []  Back     Specify site examined:    []  Inspect/palp []  ROM   []  Stability []  Strength/tone []  Used arms to push up from seated to standing position   Assistive device:  []  single point cane []  quad cane  []  FWW []  rollator walker      Skin  []  Inspection []  Palpation     Neuro  []  Alert and oriented     []  CN2-12 intact grossly   []   DTR      []  Muscle strength      []  Sensation   []  Pronator drift   []  Finger to Nose/Heel to Shin   []  Romberg     Psych  []  Insight/judgement     []  Mood/affect    []  Gross cognition            LABS/STUDIES     LABS:  Lab Results   Component Value Date    WBC 6.14 03/16/2021    WBC 6.40 12/20/2020    WBC 10.43 (H) 12/19/2020    HGB 12.4 03/16/2021    HGB 9.7 (L) 12/20/2020    HGB 10.8 (L) 12/19/2020    MCV 98.9 (H) 03/16/2021    PLT 434 (H) 03/16/2021    PLT 278 12/20/2020    PLT 332 12/19/2020     Lab Results   Component Value Date    NA 136 03/16/2021    NA 139 12/20/2020    NA 137 12/19/2020    K 4.5 03/16/2021    K 3.9 12/20/2020    K 4.2 12/19/2020    CREAT 1.37 (H) 03/16/2021    CREAT 1.16 12/20/2020    CREAT 1.12 12/19/2020    GFRESTNOAA 29 06/09/2020    GFRESTNOAA 28 05/18/2020 GFRESTNOAA 30 03/23/2020    GFRESTAA 34 06/09/2020    GFRESTAA 33 05/18/2020    GFRESTAA 35 03/23/2020    CALCIUM 9.4 03/16/2021    CALCIUM 8.5 (L) 12/20/2020    CALCIUM 9.3 12/19/2020     Lab Results   Component Value Date    ALT 9 03/16/2021    ALT 11 12/18/2020    AST 22 03/16/2021    AST 28 12/18/2020    ALKPHOS 77 03/16/2021    BILITOT 0.2 03/16/2021    ALBUMIN 3.9 03/16/2021    ALBUMIN 3.8 (L) 12/18/2020     Lab Results   Component Value Date    TSH 1.5 03/16/2021    TSH 2.8 03/23/2020    TSH 1.00 12/17/2018     Lab Results   Component Value Date    HGBA1C 5.1 03/16/2021    HGBA1C 5.2 03/23/2020    HGBA1C 4.9 09/22/2019     No results found for: CHOL, CHOLDLCAL  No results found for: CHOLDLQ  No results found for: [facility]  Lab Results   Component Value Date    FE 87 07/14/2019    FERRITIN 57 07/14/2019    FOLATE 9.6 11/25/2019    TIBC 356 07/14/2019     Lab Results   Component Value Date    VITAMINB12 3,994 (H) 11/25/2019    VITAMINB12 217 (L) 09/22/2019     Lab Results   Component Value Date    BNP 92 09/22/2019     No results found for: PSATOTAL      STUDIES:    EKG  This data element was independently reviewed and interpreted by me     NSR NO ST or T changes    XR CHEST PA LAT 2V  September 22, 2019?  COMPARISON: [facility] December 17, 2018  ?  History: sob  ?  FINDINGS:  ?  Lungs: Clear  Heart/aorta: Normal heart size. The thoracic aorta is mildly calcified, tortuous and ectatic.  Adenopathy: None  Pleura: No effusion  Bones and Chest wall: No acute bony or body wall  findings. Osteopenia and bony maturational changes. Right upper quadrant cholecystectomy clips stable from October 2020  ?  ?  IMPRESSION:  ?  No acute findings or abnormalities related to provided history.      MRI C spine  May 03, 2021  IMPRESSION:  Redemonstrated congenital cervical spinal canal stenosis with superimposed multilevel degenerative changes, which are mildly progressive compared to prior as above. No evidence of cord compression or cord signal abnormality.    ASSESSMENT and PLAN     Jevaeh Shams is a 86 y.o. female who presents today for *follow up**.        #Diarrhea--await stool samples  #HFrEF--noted on echo-- 2023--Not Worsening.  Chronic.  Will monitor and treat underlying risk factors with medical and lifestyle interventions as warranted by balance between benefits and burdens.   #CAD--discussed medical management alone with shared decision making model May 11, 2021.  #Cervical myelopathy--monitored by neuro--chronic--re-imaged on MRI as above.  CTM.  #HTN--Increased nifedipine CR 60 mg daily. Continue losartan 50 mg.  BP Better.    #CKD--stage 4--chronic--CTM--control BP as tolerated by symptoms of orthostasis and fatigue  #CAD--patient leaning towards medical management.  #Aortic Calcification--noted on chest imaging     September 22, 2019?       --Not Worsening.  Chronic.  Will monitor and treat underlying risk factors with medical and lifestyle interventions as warranted by balance between benefits and burdens.  #allergic rhinitis--trial flonase  #Macrocytosis--  #Deconditioning--initiate home PT  #GERD--s/p Nisan fundoplication  #COPD--mixed--per PFT's from [name].--albuterol prn.  May resume other inhalders.  #s/p rectocele repair  #s/p cholecystectomy  #Immunosupressed status--medication related.  Chronic. CTM  #Nocturnal hypoxemia--presumably related to MG crisis but has history of COPD. ?Will order nocturnal home O2 study and consider PFT's/Pulmonary evaluation once settled down. ?May need to restart BREO inhaler.  #Myasthenia Gravis--s/p plasmapheresis x5 October 2020, no Thymoma, on Azathioprine 150 daily and mestinon 60 tid.??Saw?Dr. Enedina Finner at [facility]. ?Was considering Soliris.??Ordering IVIG and MRI.  Encourage ongoing neurology follow up.   #IBS--titrate miralax to comfort. ?  #Interstitial cystitis--on daily Trimethoprim 100 for UTI prophylaxis. ?Using bladder instillations prn. OFF topical estrogen due to advice from neurologist re: MG.  To see Dr. Lorenz Coaster.  Suspect recent flare related to breast CA re-diagnosis.  Doubt pseudomonas bactiuria is pathogen at present.  WIll complete current meropenum.  #Major Depression--active but appears better s/p  increase Duloxetine to 60 mg daily.   #Allergic rhinitis--previously on nasal steroids.  #Breast CA--Now with bx+.  To see [place] [facility] Friday. ?Patient declined surgery in 2019 and experienced dry mouth from Tamoxifen. ?Was considering Anastrozole.Per records from [facility]:   ''07/20/2017 Screening detected right breast calcifications and distortion. The distortion was in the upper outer quadrant: Biopsy DCIS intermediate grade with CSL; calcifications UIQ: 2.8 cm: Biopsy DCIS+ ALH +CSL; 4 cm apart, axilla negative, Tis NX stage 0''  Reviewed MRI results.  Patient still declines surgery.  #DM with CKD--stabe 3b--based on labs from [name]. ?FOllow here. ?If BP permits, may consider ARB BUT patient with h/o angioedema so will ONLY initiate this agent with great caution.  Refer to Ophthalmology.  #OA knees--previously getting hyaluronic acid injections.  #Anxiety  #Benzodiazepine dependence--Weaning to off.  #Fibromyalgia--duloxetine as above.   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  #Statin intolerance      FOLLOW-UP     RTC     Future Appointments   Date Time Provider Department Center   05/11/2021  1:20 PM York Ram., MD GERI IMS [address]   05/17/2021 11:00 AM Clelia Croft, MD CRD DIS [facility]  08/15/2021 11:30 AM NEU MG [facility] [address]         The above plan of care, diagnosis, orders, and follow-up were discussed with the patient and/or surrogate. Questions related to this recommended plan of care were answered.    ANALYSIS OF DATA (Needs to meet 1 category for moderate and 2 categories for high LOS)     I have:     Category 1 (Needs 3 for moderate and high LOS)     []  Reviewed []  1 []  2 []  ? 3 unique laboratory, radiology, and/or diagnostic tests noted below    Test/Study:  on date .    []  Reviewed []  1 []  2 []  ? 3 prior external notes and incorporated into patient assessment    I reviewed Dr. 's note in specialty  from date .    []  Discussed management or test interpretation with external provider(s) as noted      []  Ordered []  1 []  2 []  ? 3 unique laboratory, radiology, and/or diagnostic tests noted in A&P    []  Obtained history from independent historian:       Category 2  []  Independently interpreted the test    Category 3  []  Discussed management or test interpretation with external provider(s) as noted      INTERACTION COMPLEXITY and SOCIAL DETERMINANTS of HEALTH       PROBLEM COMPLEXITY   []  New problem with uncertain diagnosis or prognosis (moderate)   []  Multiple stable chronic problems (moderate)   []  Chronic problem not stable - not controlled, symptomatic, or worsening (moderate)   []  Severe exacerbation of chronic problem (high)   []  New or chronic problem that poses threat to life or bodily function (high)     MANAGEMENT COMPLEXITY   []  Old or external/outside records reviewed   []  Discussion with alternate (proxy) if patient with impaired communication / comprehension ability (e.g., dementia, aphasia, severe hearing loss).   []  Repeated questions (or disagreement) between patient and/among caregivers/family during the visit.  []  Caregiver/patient emotions/behavior/beliefs interfering with implementation of treatment plan.  []  Independent interpretation of test (EKG, Chest XRay)   []  Discussion of case with a consultant physician     RISK LEVEL   []  Prescription drug management (moderate)   []  Minor surgery with CV risk factors or elective major surgery (moderate)   []  Dx or Rx significantly limited by SDoH (inadequate housing, living alone, poor health care access, inappropriate diet; low literacy) (moderate)   []  Major surgery - elective with CV risk factors or emergent (high)   []  Need for hospitalization (high)   []  New DNR or de-escalation of care (high)      SDoH  The diagnosis or treatment of said conditions is significantly limited by the following social determinants of health:  []  Z59.0 Homelessness  []  Z59.1 Inadequate housing  []  Z59.2 Discord with neighbors, lodgers and landlord  []  Z60.2 Problems related to living alone  []  Z59.8 Other problems related to housing and economic circumstances  []  Z59.4 Lack of adequate food and safe drinking water  []  Z59.6 Low income  []  Z59.7 Insufficient social insurance and welfare support  []  Z59.9 Problems related to housing and economic circumstances, unspecified  []  Z75.3 Unavailability and inaccessibility of health care facilities  []  Z75.4 Unavailability and inaccessibility of other helping agencies  []  Z72.4 Inappropriate diet and eating habits  []  Z62.820 Parent-biological child conflict  []  Z63.8 Other specified problems related to primary support  group  []  Z55.0 Illiteracy and low level literacy   []  Z56.9 Unspecified problems related to employment        If Billing Based on Time:     I performed the following items on the day of service:    [x]  Preparing to see the patient (e.g., review of tests)  [x]  Obtaining and/or reviewing separately obtained history   [x]  Performing a medically appropriate examination and/or evaluation   [x]  Counseling and educating the patient/family/caregiver   [x]  Ordering medications, tests, or procedures  [x]  Referring and communicating with other healthcare professionals (when not separately reported)  [x]  Documenting clinical information in the EHR  []  Independently interpreting results and communicating results to patient/family/caregiver    I spent the following total amount of time on these tasks on the day of service:  New Patient     Established Patient  []  15-29 minutes - 99202    []  up to 9 minutes - 99211  []  30-44 minutes - 99203     []  10-19 minutes - 99212   []  45-59 minutes - 99204      []  20-29 minutes - 99213   []  60-74 minutes - 99205   [x]  30- minutes - 99214         []  40-55 minutes - 99215    []  I spent an additional ** * 15-minute-increment(s) for a total of * ** minutes on these tasks on the day of service. [phone] for each additional 15 minutes.)    Author: York Ram, MD 05/11/2021

## 2021-05-17 ENCOUNTER — Ambulatory Visit: Payer: BLUE CROSS/BLUE SHIELD

## 2021-05-17 DIAGNOSIS — R9439 Abnormal result of other cardiovascular function study: Secondary | ICD-10-CM

## 2021-05-17 DIAGNOSIS — I10 Essential (primary) hypertension: Secondary | ICD-10-CM

## 2021-05-17 NOTE — Patient Instructions
Please notes that a blood draw has been ordered. Please get your blood drawn as we discussed so that we can review your blood tests.

## 2021-05-17 NOTE — Consults
CARDIOLOGY NOTE      PATIENT: [name]   MRN: [id number]   DOB: 09/10/1935   DATE OF SERVICE: 05/17/2021    PCP:  York Ram., MD      Problem List Items Addressed This Visit     Aortic calcification (HCC/RAF)   Other Visit Diagnoses     Essential hypertension    -  Primary    Abnormal stress test                Interval History:     The patient feels SOB/dyspnea - stable      Prior Encounter:     This is a 86 y.o. female who is here for cardiac evaluation.  Indication:   SOB    The patient is a/w friend, [name]    Pt was recently hospitalized with SOB and chest pain.   Pt ambulates with a walker for precautionary reasons given history of falls.   With activity, the patient reports SOB/dypnea. She walks the dog 1/2 block  Her symptoms have been ongoing for the past 3 years, progressive worsening.   The patient notes allergies and asthma.   About 1 year ago, she could walk 1.5 blocks         Past Medical History:   Diagnosis Date   ? Breast cancer (HCC/RAF) 12/20/2018       No past surgical history on file.    Allergies:   Allergies   Allergen Reactions   ? Beta Adrenergic Blockers Other (See Comments)     Avoid d/t Myasthenia Gravis  Avoid d/t Myasthenia Gravis     ? Levofloxacin Other (See Comments)     Flare of Myasthenia Gravis   ? Macrolides And Ketolides Other (See Comments)     Avoid d/t Myasthenia Gravis  Use with caution d/t Myasthenia Gravis  Use with caution d/t Myasthenia Gravis     ? Sulfa Antibiotics Other (See Comments)     May possibly have caused deafness in one ear  May possibly have caused deafness in one ear  other     ? Botulinum Toxins Other (See Comments)     Muscle weakness r/t Myasthenia Gravis  Muscle weakness r/t Myasthenia Gravis     ? Statins Other (See Comments)     Muscle aches  Muscle aches  Muscle aches     ? Plastic Tape Itching and Other (See Comments)     Turns the skin red where applied       Social History     Socioeconomic History   ? Marital status: Widowed   Tobacco Use   ? Smoking status: Former     Types: Cigarettes     Quit date: 1985     Years since quitting: 38.2   ? Smokeless tobacco: Former   Substance and Sexual Activity   ? Alcohol use: Not Currently   ? Drug use: Never       No family history on file.  Adopted     Objective:     BP 130/59  ~ Pulse 90  ~ Ht 5' 5'' (1.651 m)  ~ Wt 142 lb (64.4 kg)  ~ SpO2 95%  ~ BMI 23.63 kg/m?     Vitals:    05/17/21 1055   BP: 130/59   Pulse: 90   SpO2: 95%   Weight: 142 lb (64.4 kg)   Height: 5' 5'' (1.651 m)       Body mass index is 23.63  kg/m?.    Wt Readings from Last 3 Encounters:   05/17/21 142 lb (64.4 kg)   05/11/21 141 lb 11.2 oz (64.3 kg)   04/26/21 143 lb 3.2 oz (65 kg)          Physical Exam:   General Exam: no apparent distress.     Neurologic/Psychiatric: alert and oriented. Affect is appropriate.   HEENT: Sclerae are anicteric. There is no epistaxis.   Cardiac: regular in rate and rhythm.     Respiratory: non-labored breathing. No use of accessory muscles. No wheeze.    Abdomen: bowel sounds are present. Abdomen is soft. No rebound.    Extremities: no edema. No cyanosis or clubbing.     Integument: no petechiae. No acute rash.         Laboratory:  Lab Results   Component Value Date    NA 136 03/16/2021    K 4.5 03/16/2021    CL 101 03/16/2021    CO2 25 03/16/2021    BUN 24 (H) 03/16/2021    CREAT 1.37 (H) 03/16/2021    GLUCOSE 95 03/16/2021    CALCIUM 9.4 03/16/2021     Lab Results   Component Value Date    TROPONIN 0.05 (H) 12/19/2020    TROPONIN 0.07 (H) 12/19/2020    TROPONIN 0.06 (H) 12/18/2020     Lab Results   Component Value Date    BNP 92 09/22/2019     Lab Results   Component Value Date    WBC 6.14 03/16/2021    HGB 12.4 03/16/2021    HCT 36.4 03/16/2021    MCV 98.9 (H) 03/16/2021    PLT 434 (H) 03/16/2021     No results found for: CHOL, CHOLHDL, CHOLDLCAL, CHOLDLQ, TRIGLY  Lab Results   Component Value Date    PT 12.9 12/18/2020    INR 1.0 12/18/2020     No components found for: HA1C  Lab Results   Component Value Date    TSH 1.5 03/16/2021       Patient Active Problem List   Diagnosis   ? Acute medial meniscal tear, left, subsequent encounter   ? Angioedema   ? Anxiety   ? Asthmatic bronchitis, mild persistent, with acute exacerbation   ? Chronic cholecystitis   ? Chronic interstitial cystitis   ? CKD (chronic kidney disease), stage III (HCC/RAF)   ? COPD mixed type (HCC/RAF)   ? Major depression, recurrent, chronic (HCC/RAF)   ? Diabetes mellitus (HCC/RAF)   ? Esophageal reflux   ? Eustachian tube dysfunction   ? Fibromyalgia   ? H/O arthroscopy   ? Insomnia   ? Myasthenia gravis with exacerbation (HCC/RAF)   ? Nocturnal hypoxemia   ? Osteoarthritis of knee   ? Renal cyst   ? Seasonal and perennial allergic rhinitis   ? Spondylosis without myelopathy or radiculopathy, lumbar region   ? Urethral caruncle   ? Vaginal atrophy   ? Benzodiazepine dependence (HCC/RAF)   ? Breast cancer (HCC/RAF)   ? Refractive error   ? Ptosis of eyelid   ? Pseudophakia of both eyes   ? Aortic calcification (HCC/RAF)   ? HTN (hypertension)   ? Immunosuppressed status (HCC/RAF)   ? Acute chest pain   ? Atypical chest pain       Outpatient Medications Prior to Visit   Medication Sig   ? Albuterol Sulfate AEPB Takes 1 to 2 puffs every 4 hours as needed for wheezing or SOB Inhale Takes 1  to 2 puffs every 4 hours as needed for wheezing or SOB ..   ? anastrozole 1 mg tablet Take 1 tablet (1 mg total) by mouth daily.   ? ascorbic acid 500 mg tablet Take 1 tablet (500 mg total) by mouth two (2) times daily with meals.   ? aspirin 81 mg EC tablet Take 1 tablet (81 mg total) by mouth daily.   ? azaTHIOprine 50 mg tablet Take one and one-half tablet (75 mg total) by mouth two (2) times daily.   ? buPROPion, SR, (WELLBUTRIN SR) 200 mg 12 hr tablet Take 1 tablet (200 mg total) by mouth two (2) times daily.   ? clonazePAM 1 mg tablet 0.5 mg in am and 1 mg in pm.   ? DEXILANT 60 MG DR capsule TAKE 1 CAPSULE (60 MG TOTAL) BY MOUTH DAILY   ? DULoxetine 20 mg DR capsule Take 3 capsules (60 mg total) by mouth daily.   ? estradiol (ESTRACE) 0.1 mg/g vaginal cream Place 1 g vaginally three (3) times a week.   ? fluticasone-vilanterol (BREO ELLIPTA) 100-25 mcg/inh inhaler Inhale 1 puff daily.   ? GAMUNEX-C 20 GM/200ML infusion    ? losartan 50 mg tablet Take 1 tablet (50 mg total) by mouth daily.   ? meclizine 25 mg tablet Take 1 tablet (25 mg total) by mouth three (3) times daily as needed for Dizziness.   ? montelukast 10 mg tablet Take 1 tablet (10 mg total) by mouth as needed for.   ? trimethoprim 100 mg tablet Take 1 tablet (100 mg total) by mouth daily.     Facility-Administered Medications Prior to Visit   Medication   ? lidocaine 2% inj 5 mL   ? lidocaine 2% inj 5 mL   ? lidocaine-EPINEPHrine 1 %-1:100000 inj 4.8 mL   ? lidocaine-EPINEPHrine 1 %-1:100000 inj 5 mL       Current Outpatient Medications   Medication Sig   ? Albuterol Sulfate AEPB Takes 1 to 2 puffs every 4 hours as needed for wheezing or SOB Inhale Takes 1 to 2 puffs every 4 hours as needed for wheezing or SOB ..   ? anastrozole 1 mg tablet Take 1 tablet (1 mg total) by mouth daily.   ? ascorbic acid 500 mg tablet Take 1 tablet (500 mg total) by mouth two (2) times daily with meals.   ? aspirin 81 mg EC tablet Take 1 tablet (81 mg total) by mouth daily.   ? azaTHIOprine 50 mg tablet Take one and one-half tablet (75 mg total) by mouth two (2) times daily.   ? buPROPion, SR, (WELLBUTRIN SR) 200 mg 12 hr tablet Take 1 tablet (200 mg total) by mouth two (2) times daily.   ? clonazePAM 1 mg tablet 0.5 mg in am and 1 mg in pm.   ? DEXILANT 60 MG DR capsule TAKE 1 CAPSULE (60 MG TOTAL) BY MOUTH DAILY   ? DULoxetine 20 mg DR capsule Take 3 capsules (60 mg total) by mouth daily.   ? estradiol (ESTRACE) 0.1 mg/g vaginal cream Place 1 g vaginally three (3) times a week.   ? fluticasone-vilanterol (BREO ELLIPTA) 100-25 mcg/inh inhaler Inhale 1 puff daily.   ? GAMUNEX-C 20 GM/200ML infusion    ? losartan 50 mg tablet Take 1 tablet (50 mg total) by mouth daily.   ? meclizine 25 mg tablet Take 1 tablet (25 mg total) by mouth three (3) times daily as needed for Dizziness.   ?  montelukast 10 mg tablet Take 1 tablet (10 mg total) by mouth as needed for.   ? trimethoprim 100 mg tablet Take 1 tablet (100 mg total) by mouth daily.     No current facility-administered medications for this visit.     Facility-Administered Medications Ordered in Other Visits   Medication Dose Route Frequency   ? lidocaine 2% inj 5 mL  5 mL Intradermal Once   ? lidocaine 2% inj 5 mL  5 mL Intradermal Once   ? lidocaine-EPINEPHrine 1 %-1:100000 inj 4.8 mL  4.8 mL Intradermal Once   ? lidocaine-EPINEPHrine 1 %-1:100000 inj 5 mL  5 mL Intradermal Once             2018 ACC/AHA guidelines recommends high-intensity statin because of ASCVD diagnosis. Patient is intolerant to statin. Consider non-statin Rx or referral.  10-Year ASCVD risk calculator does not apply because patients with ASCVD are recommended to take a high-intensity statin.  as of 11:29 AM on 05/17/2021.  10-Year ASCVD risk with optimal risk factors cannot be calculated.  Values used to calculate ASCVD score:  Age: 86 y.o. Cannot calculate risk because age is not between 9 and 31 years old.  Gender: Female Race: White: Not Listed  Cannot calculate risk because HDL cholesterol not documented within the past 5 years.    Cannot calculate risk because Total cholesterol not documented within the past 5 years.    LDL cholesterol not documented within the past 5 years.    Systolic BP: 130 mm Hg. BP was measured on 05/17/2021.  The patient is being treated with a medication that influences SBP.  The patient is currently not a smoker.  The patient has a diagnosis of diabetes.  Click here for the [facility] ASCVD Cardiovascular Risk Estimator Plus tool Office manager).      Assessment:     Mallerie Blok is 86 y.o. female with    Cardiomyopathy, EF 35-40%. Echo in 2023    - avoiding BBlockers due to myasthenia gravis    Abnormal Myoview in 2023: There is a moderate-severe perfusion defect in the distal anteroseptal wall and apex, suggestive for MI.  SOB/dyspnea. Uses oxygen concentrator  Abnormal EKG with LBBB and PACs  Former tobacco use  Subtle calcifications of the aortic root. Moderately calcified thoracic aorta and its main branch vessels.  Intolerant to Statins    Plan:     Increase Losartan to 50 mg  ASA    Check lipids, considering therapy.      York Ram., MD  Clelia Croft, MD  Hi Gesselle Fitzsimons.   She and I had a thoughtful discussion today. ?In light of her breast cancer and her Myasthenia Gravis, it seems like medical management of her CAD is best.         Orders Placed This Encounter   ? BNP   ? ECG 12-Lead Clinic Performed           Return in about 4 weeks (around 06/14/2021).      Patient Instructions   Please notes that a blood draw has been ordered. Please get your blood drawn as we discussed so that we can review your blood tests.       Future Appointments   Date Time Provider Department Center   07/05/2021 11:40 AM York Ram., MD GERI [facility] [address] [place]   08/15/2021 11:30 AM NEU MG CLINIC [facility] NE[facility] [address] [place]/Cen         Author: Clelia Croft, MD

## 2021-06-02 ENCOUNTER — Ambulatory Visit: Payer: BLUE CROSS/BLUE SHIELD

## 2021-06-02 ENCOUNTER — Telehealth: Payer: BLUE CROSS/BLUE SHIELD

## 2021-06-02 DIAGNOSIS — R300 Dysuria: Secondary | ICD-10-CM

## 2021-06-02 NOTE — Telephone Encounter
Informed patient  of MD response she will try to come in for a urine collection as recommended by MD .   Lab hours given to patient  Saturday 7-3 pm    thank you

## 2021-06-02 NOTE — Telephone Encounter
Patient calling regarding symptoms of a bladder infection.   Patient has had symptoms for the last 2 days and  has taken  AZO as treatment .  Patient would like to have antibiotics prescribed, Pharmacy has been confirmed.   Please advise

## 2021-06-02 NOTE — Telephone Encounter
Left a message for patient to call us back regarding her pain and discomfort.

## 2021-06-02 NOTE — Telephone Encounter
Message to Practice/Provider      Message: Pt is returning nurse Liliam call             Return call is not being requested by the patient or caller.    Patient or caller has been notified of the turnaround time of 1-2 business day(s).

## 2021-06-05 DIAGNOSIS — N3000 Acute cystitis without hematuria: Secondary | ICD-10-CM

## 2021-06-08 MED ORDER — PYRIDOSTIGMINE BROMIDE 60 MG PO TABS
ORAL_TABLET | 2.00 refills | 30.00000 days
Start: 2021-06-08 — End: ?

## 2021-06-09 MED ORDER — PYRIDOSTIGMINE BROMIDE 60 MG PO TABS
ORAL_TABLET | 4 refills | Status: AC
Start: 2021-06-09 — End: ?

## 2021-06-14 ENCOUNTER — Ambulatory Visit: Payer: BLUE CROSS/BLUE SHIELD

## 2021-07-05 ENCOUNTER — Ambulatory Visit: Payer: BLUE CROSS/BLUE SHIELD | Attending: Geriatric Medicine

## 2021-07-05 DIAGNOSIS — E1122 Type 2 diabetes mellitus with diabetic chronic kidney disease: Secondary | ICD-10-CM

## 2021-07-05 DIAGNOSIS — Z23 Encounter for immunization: Secondary | ICD-10-CM

## 2021-07-05 DIAGNOSIS — N1832 Type 2 diabetes mellitus with stage 3b chronic kidney disease, without long-term current use of insulin (HCC/RAF): Secondary | ICD-10-CM

## 2021-07-05 MED ORDER — CLONAZEPAM 1 MG PO TABS
ORAL_TABLET | 0 refills | Status: AC
Start: 2021-07-05 — End: ?

## 2021-07-05 NOTE — Progress Notes
OUTPATIENT GERIATRICS CLINIC NOTE    PATIENT:  [name]   MRN:  [id number]  DOB:  05/29/1935  DATE OF SERVICE:  07/05/2021  PRIMARY CARE PHYSICIAN: York Ram, MD    CHIEF COMPLAINT:   No chief complaint on file.      [place] Specialists:  [employer]    Non-[place] Specialists:    Chaperone status:  No data recorded    HISTORY OF PRESENT ILLNESS     [name] is a 86 y.o. female who presents today for *follow up**. Patient is accompanied by *no one**. History today is per the patient,and review of available recent records in [facility] and Care Everywhere.     Patient is a(n) [x]  reliable []  unreliable historian and additional collateral information was obtained during the visit today from:       Larey Seat forward running after dog.  Fell off of step.  Foot got caught on step.  Not walking.  Gets easily fatigued.      Per chart review, pertinent medical history:  Past Medical History:   Diagnosis Date   ? Breast cancer (HCC/RAF) 12/20/2018     No past surgical history on file.    ALLERGIES     Allergies   Allergen Reactions   ? Beta Adrenergic Blockers Other (See Comments)     Avoid d/t Myasthenia Gravis  Avoid d/t Myasthenia Gravis     ? Levofloxacin Other (See Comments)     Flare of Myasthenia Gravis   ? Macrolides And Ketolides Other (See Comments)     Avoid d/t Myasthenia Gravis  Use with caution d/t Myasthenia Gravis  Use with caution d/t Myasthenia Gravis     ? Sulfa Antibiotics Other (See Comments)     May possibly have caused deafness in one ear  May possibly have caused deafness in one ear  other     ? Botulinum Toxins Other (See Comments)     Muscle weakness r/t Myasthenia Gravis  Muscle weakness r/t Myasthenia Gravis     ? Statins Other (See Comments)     Muscle aches  Muscle aches  Muscle aches     ? Plastic Tape Itching and Other (See Comments)     Turns the skin red where applied        MEDICATIONS     Personally reviewed.    No outpatient medications have been marked as taking for the 07/05/21 encounter (Appointment) with York Ram, MD.       SOCIAL HISTORY     Social History     Social History Narrative   ? Not on file        FUNCTIONAL STATUS     BADLs:  IADLs:    Ambulates with   Falls in past year:  Afraid of falling:    GERIATRIC REVIEW OF SYSTEMS     Vision:    []   glasses     []  legally blind  Hearing: []  hearing aids    Nutrition: []  normal   []  impaired  []  vegan  []  vegetarian  []  low salt  []  low-carb   Swallowing: []  impaired   Dentures:   []  yes    Depression:  []  yes   Cognition:     Incontinence: [] urine  []  fecal  []  urine and fecal      ADVANCED CARE PLANNING     Advanced directives on file: []  No  []   Yes ? Completed:     Medical  DPOA on file:   []   Yes ?   []   No ? Patient designates ** * to be their surrogate medical decision-maker.         HEALTH CARE MAINTENANCE     IMMUNIZATIONS:   Immunization History   Administered Date(s) Administered   ? COVID-19, mRNA, (Pfizer - Purple Cap) 30 mcg/0.3 mL 03/11/2019, 04/01/2019, 10/29/2019   ? COVID-19, mRNA, tris-sucrose (Pfizer - [employer]) 30 mcg/0.3 mL 06/20/2020   ? DTaP 12/31/2015   ? influenza vaccine IM quadrivalent (Fluzone Quad) MDV (23 months of age and older) 11/26/2016   ? influenza vaccine IM quadrivalent adjuvanted (FluAD Quad) (PF) SYR (75 years of age and older) 11/05/2018, 12/01/2019   ? influenza, unspecified formulation 04/01/2019, 04/01/2019, 12/01/2019, 12/01/2019, 12/01/2019, 12/01/2019, 11/25/2020, 11/25/2020   ? pneumococcal polysaccharide vaccine 23-valent (Pneumovax) 01/10/2019       Mammogram:  DXA:  PAP:  Colonoscopy:      PHYSICAL EXAM     BP 138/76 (BP Location: Left arm, Patient Position: Sitting, Cuff Size: Regular) Comment: 2nd reading ~ Pulse 95  ~ Temp 36.5 ?C (97.7 ?F) (Temporal)  ~ Resp 16  ~ Ht 5' 5'' (1.651 m)  ~ Wt 138 lb (62.6 kg)  ~ SpO2 98%  ~ BMI 22.96 kg/m?   Wt Readings from Last 3 Encounters:   05/17/21 142 lb (64.4 kg)   05/11/21 141 lb 11.2 oz (64.3 kg)   04/26/21 143 lb 3.2 oz (65 kg) System Check if normal Positive or additional negative findings   GEN  [x]  NAD     Eyes  []  Conj/Lids []  Pupils  []  Fundi   []  Sclerae []  EOM     ENT  []  External ears   []  Otoscopy   []  Gross Hearing    []   External nose   []  Nasal mucosa   []  Lips/teeth/gums    []  Oropharynx    []  Mucus membranes      Neck  []  Inspection/palpation    []  Thyroid     Resp  []  Effort    []  Auscultation       CV  []  Rhythm/rate   []  Murmurs   []  Edema   []  JVP non-elevated    Normal pulses:   []  Radial []  Femoral  []  Pedal     Breast  []  Inspection []  Palpation     GI  []  Bowel sounds    []  Nontender   []  No distension    []  No rebound or guarding   []  No masses   []  Liver/spleen    []  Rectal     GU  F:  []  External []  vaginal wall         []  Cervix     []  mucus        []  Uterus    []  Adnexa   M:  []  Scrotum []  Penis         []  Prostate     Lymph  []  Cervical []  supraclavicular     []  Axillae   []  Groin/inguinal     MSK []  Gait  []  Back     Specify site examined:    []  Inspect/palp []  ROM   []  Stability []  Strength/tone []  Used arms to push up from seated to standing position   Assistive device:  []  single point cane []  quad cane  []  FWW []  rollator walker      Skin  []  Inspection []  Palpation  Neuro  []  Alert and oriented     []  CN2-12 intact grossly   []  DTR      []  Muscle strength      []  Sensation   []  Pronator drift   []  Finger to Nose/Heel to Shin   []  Romberg     Psych  []  Insight/judgement     []  Mood/affect    []  Gross cognition            LABS/STUDIES     LABS:  Lab Results   Component Value Date    WBC 6.14 03/16/2021    WBC 6.40 12/20/2020    WBC 10.43 (H) 12/19/2020    HGB 12.4 03/16/2021    HGB 9.7 (L) 12/20/2020    HGB 10.8 (L) 12/19/2020    MCV 98.9 (H) 03/16/2021    PLT 434 (H) 03/16/2021    PLT 278 12/20/2020    PLT 332 12/19/2020     Lab Results   Component Value Date    NA 136 03/16/2021    NA 139 12/20/2020    NA 137 12/19/2020    K 4.5 03/16/2021    K 3.9 12/20/2020    K 4.2 12/19/2020    CREAT 1.37 (H) 03/16/2021    CREAT 1.16 12/20/2020    CREAT 1.12 12/19/2020    GFRESTNOAA 29 06/09/2020    GFRESTNOAA 28 05/18/2020    GFRESTNOAA 30 03/23/2020    GFRESTAA 34 06/09/2020    GFRESTAA 33 05/18/2020    GFRESTAA 35 03/23/2020    CALCIUM 9.4 03/16/2021    CALCIUM 8.5 (L) 12/20/2020    CALCIUM 9.3 12/19/2020     Lab Results   Component Value Date    ALT 9 03/16/2021    ALT 11 12/18/2020    AST 22 03/16/2021    AST 28 12/18/2020    ALKPHOS 77 03/16/2021    BILITOT 0.2 03/16/2021    ALBUMIN 3.9 03/16/2021    ALBUMIN 3.8 (L) 12/18/2020     Lab Results   Component Value Date    TSH 1.5 03/16/2021    TSH 2.8 03/23/2020    TSH 1.00 12/17/2018     Lab Results   Component Value Date    HGBA1C 5.1 03/16/2021    HGBA1C 5.2 03/23/2020    HGBA1C 4.9 09/22/2019     Lab Results   Component Value Date    CHOL 199 06/03/2021     Lab Results   Component Value Date    CHOLDLQ 128 (H) 06/03/2021     No results found for: [facility]  Lab Results   Component Value Date    FE 87 07/14/2019    FERRITIN 57 07/14/2019    FOLATE 9.6 11/25/2019    TIBC 356 07/14/2019     Lab Results   Component Value Date    VITAMINB12 3,994 (H) 11/25/2019    VITAMINB12 217 (L) 09/22/2019     Lab Results   Component Value Date    BNP 156 (H) 06/03/2021    BNP 92 09/22/2019     No results found for: PSATOTAL      STUDIES:    EKG  This data element was independently reviewed and interpreted by me     NSR NO ST or T changes    XR CHEST PA LAT 2V  September 22, 2019?  COMPARISON: [facility]UB December 17, 2018  ?  History: sob  ?  FINDINGS:  ?  Lungs: Clear  Heart/aorta: Normal heart  size. The thoracic aorta is mildly calcified, tortuous and ectatic.  Adenopathy: None  Pleura: No effusion  Bones and Chest wall: No acute bony or body wall  findings. Osteopenia and bony maturational changes. Right upper quadrant cholecystectomy clips stable from October 2020  ?  ?  IMPRESSION:  ?  No acute findings or abnormalities related to provided history.      MRI C spine  May 03, 2021  IMPRESSION:  Redemonstrated congenital cervical spinal canal stenosis with superimposed multilevel degenerative changes, which are mildly progressive compared to prior as above. No evidence of cord compression or cord signal abnormality.    ASSESSMENT and PLAN     Raveen Wieseler is a 86 y.o. female who presents today for *follow up**.        #Diarrhea--await stool samples  #HFrEF--noted on echo-- 2023--Not Worsening.  Chronic.  Will monitor and treat underlying risk factors with medical and lifestyle interventions as warranted by balance between benefits and burdens.   #CAD--discussed medical management alone with shared decision making model May 11, 2021.  Declines staint  #Cervical myelopathy--monitored by neuro--chronic--re-imaged on MRI as above.  CTM.  #HTN--Increased nifedipine CR 60 mg daily. Continue losartan 50 mg.  BP Better.    #CKD--stage 4--chronic--CTM--control BP as tolerated by symptoms of orthostasis and fatigue  #CAD--patient leaning towards medical management.  #Aortic Calcification--noted on chest imaging     September 22, 2019?       --Not Worsening.  Chronic.  Will monitor and treat underlying risk factors with medical and lifestyle interventions as warranted by balance between benefits and burdens.  #allergic rhinitis--trial flonase  #Macrocytosis--  #Deconditioning--initiate home PT  #GERD--s/p Nisan fundoplication  #COPD--mixed--per PFT's from [name].--albuterol prn.  May resume other inhalders.  #s/p rectocele repair  #s/p cholecystectomy  #Immunosupressed status--medication related.  Chronic. CTM  #Nocturnal hypoxemia--presumably related to MG crisis but has history of COPD. ?Will order nocturnal home O2 study and consider PFT's/Pulmonary evaluation once settled down. ?May need to restart BREO inhaler.  #Myasthenia Gravis--s/p plasmapheresis x5 October 2020, no Thymoma, on Azathioprine 150 daily and mestinon 60 tid.??Saw?Dr. Enedina Finner at [facility]. ?Was considering Soliris.??Ordering IVIG and MRI.  Encourage ongoing neurology follow up.   #IBS--titrate miralax to comfort. ?  #Interstitial cystitis--on daily Trimethoprim 100 for UTI prophylaxis. ?Using bladder instillations prn. OFF topical estrogen due to advice from neurologist re: MG.  To see Dr. Lorenz Coaster.  Suspect recent flare related to breast CA re-diagnosis.  Doubt pseudomonas bactiuria is pathogen at present.  WIll complete current meropenum.  #Major Depression--active but appears better s/p  increase Duloxetine to 60 mg daily.   #Allergic rhinitis--previously on nasal steroids.  #Breast CA--Now with bx+.  To see [facility] Friday. ?Patient declined surgery in 2019 and experienced dry mouth from Tamoxifen. ?Was considering Anastrozole.Per records from [facility]:   ''07/20/2017 Screening detected right breast calcifications and distortion. The distortion was in the upper outer quadrant: Biopsy DCIS intermediate grade with CSL; calcifications UIQ: 2.8 cm: Biopsy DCIS+ ALH +CSL; 4 cm apart, axilla negative, Tis NX stage 0''  Reviewed MRI results.  Patient still declines surgery.  #DM with CKD--stabe 3b--based on labs from [name]. ?FOllow here. ?If BP permits, may consider ARB BUT patient with h/o angioedema so will ONLY initiate this agent with great caution.  Refer to Ophthalmology.  #OA knees--previously getting hyaluronic acid injections.  #Anxiety  #Benzodiazepine dependence--Weaning to off.  #Fibromyalgia--duloxetine as above.   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  #Statin intolerance  FOLLOW-UP     RTC     Future Appointments   Date Time Provider Department Center   07/05/2021 11:40 AM York Ram, MD GERI IMS [address] [place]/Cen   07/07/2021  1:45 PM Clelia Croft, MD CRD DIS [facility]   08/15/2021 11:30 AM NEU MG CLINIC [facility] NEUMUSC [address] [place]/Cen         The above plan of care, diagnosis, orders, and follow-up were discussed with the patient and/or surrogate. Questions related to this recommended plan of care were answered.    ANALYSIS OF DATA (Needs to meet 1 category for moderate and 2 categories for high LOS)     I have:     Category 1 (Needs 3 for moderate and high LOS)     []  Reviewed []  1 []  2 []  ? 3 unique laboratory, radiology, and/or diagnostic tests noted below    Test/Study:  on date .    []  Reviewed []  1 []  2 []  ? 3 prior external notes and incorporated into patient assessment    I reviewed Dr. 's note in specialty  from date .    []  Discussed management or test interpretation with external provider(s) as noted      []  Ordered []  1 []  2 []  ? 3 unique laboratory, radiology, and/or diagnostic tests noted in A&P    []  Obtained history from independent historian:       Category 2  []  Independently interpreted the test    Category 3  []  Discussed management or test interpretation with external provider(s) as noted      INTERACTION COMPLEXITY and SOCIAL DETERMINANTS of HEALTH       PROBLEM COMPLEXITY   []  New problem with uncertain diagnosis or prognosis (moderate)   []  Multiple stable chronic problems (moderate)   []  Chronic problem not stable - not controlled, symptomatic, or worsening (moderate)   []  Severe exacerbation of chronic problem (high)   []  New or chronic problem that poses threat to life or bodily function (high)     MANAGEMENT COMPLEXITY   []  Old or external/outside records reviewed   []  Discussion with alternate (proxy) if patient with impaired communication / comprehension ability (e.g., dementia, aphasia, severe hearing loss).   []  Repeated questions (or disagreement) between patient and/among caregivers/family during the visit.  []  Caregiver/patient emotions/behavior/beliefs interfering with implementation of treatment plan.  []  Independent interpretation of test (EKG, Chest XRay)   []  Discussion of case with a consultant physician     RISK LEVEL   []  Prescription drug management (moderate)   []  Minor surgery with CV risk factors or elective major surgery (moderate)   []  Dx or Rx significantly limited by SDoH (inadequate housing, living alone, poor health care access, inappropriate diet; low literacy) (moderate)   []  Major surgery - elective with CV risk factors or emergent (high)   []  Need for hospitalization (high)   []  New DNR or de-escalation of care (high)      SDoH  The diagnosis or treatment of said conditions is significantly limited by the following social determinants of health:  []  Z59.0 Homelessness  []  Z59.1 Inadequate housing  []  Z59.2 Discord with neighbors, lodgers and landlord  []  Z60.2 Problems related to living alone  []  Z59.8 Other problems related to housing and economic circumstances  []  Z59.4 Lack of adequate food and safe drinking water  []  Z59.6 Low income  []  Z59.7 Insufficient social insurance and welfare support  []  Z59.9 Problems related to housing  and economic circumstances, unspecified  []  Z75.3 Unavailability and inaccessibility of health care facilities  []  Z75.4 Unavailability and inaccessibility of other helping agencies  []  Z72.4 Inappropriate diet and eating habits  []  Z62.820 Parent-biological child conflict  []  Z63.8 Other specified problems related to primary support group  []  Z55.0 Illiteracy and low level literacy   []  Z56.9 Unspecified problems related to employment        If Billing Based on Time:     I performed the following items on the day of service:    [x]  Preparing to see the patient (e.g., review of tests)  [x]  Obtaining and/or reviewing separately obtained history   [x]  Performing a medically appropriate examination and/or evaluation   [x]  Counseling and educating the patient/family/caregiver   [x]  Ordering medications, tests, or procedures  [x]  Referring and communicating with other healthcare professionals (when not separately reported)  [x]  Documenting clinical information in the EHR  []  Independently interpreting results and communicating results to patient/family/caregiver    I spent the following total amount of time on these tasks on the day of service:  New Patient     Established Patient  []  15-29 minutes - 99202    []  up to 9 minutes - 99211  []  30-44 minutes - 99203     []  10-19 minutes - 99212   []  45-59 minutes - 99204      []  20-29 minutes - 99213   []  60-74 minutes - 99205   [x]  30- minutes - 99214         []  40-55 minutes - 99215    []  I spent an additional ** * 15-minute-increment(s) for a total of * ** minutes on these tasks on the day of service. [phone] for each additional 15 minutes.)    Author: York Ram, MD 07/05/2021    Annual Wellness Visit:     []  Initial Medicare Annual Wellness Visit   [x]  Subsequent Medicare Annual Wellness Visit - at least 12 months since last AWV     [x]  I reviewed patient's past medical history, family history and medications. I updated the electronic record.    [x]  I reviewed with patient what other consulting doctors are involved in the patient's care. See above for list.    Patient's Self Reported Health Status: []  poor []  fair []  good [x]  excellent  []  unreliable per patient given cognitive impairment  Hospitalizations in the past year:  []  none  []  More than 3  [x]   Less than 3  More than 5 chronic medical conditions: [x]  yes []  no   Polypharmacy (greater than 6 chronic medications):  [x]  yes []  no    Opioid assessment:  - Taking opioids: []  yes [x]  no  - If yes, address the following:  - Current pain severity:   - Evaluate current treatment plan and changes needed: []  No   []  Yes ->   - Discussed non-opioid treatment options  - Referred to specialist: []  yes []  no      Functional and Physical Assessment Cognitive & Social Assessments   Visual Acuity:    [x]  Regularly sees ophthalmologist    []  Recommend eye exam    Hearing Screen (whisper test):    []  Denies hearing complaints   []  Able to hear whisper or finger-rub at 6 feet.   []  Audiology referral placed   [x]  Hearing aides present    Weight Loss: []  yes [x]  no    Get Up and Go Test: *>  12*    Ambulatory Device:    [x]  none []  cane []  walker []   WC    Fall in past year: []  none    [x]  yes  Afraid of Falling: [x]  yes       []  no    Activities of Daily Living:   []  Independent in all ADLs and IADLs   [x]  Dependent in the following              []   Bathing    []  Groceries            []   Dressings [x]  Driving/bus            []   Toileting []  Telephone            []   Continence []  Chores            []   Grooming []  Cooking            []   Feeding []  Laundry            []  Transferring []  Medications    []  Finances    Mood: PHQ-9 **3*  Cognition: mini-cog *5/5**    Living situation:      [x]  home     []  Assisted Living/board & care        [x]  lives alone         []  lives with family        []  caregiver not needed      []  caregiver support is sufficient       [x]  has employed caregiver(s)        []  family provides caregiving        []  Provides care for another person      [x]  retired    []  still working    [x]  wears a seatbelt    [x]  no current tobacco use  []  tobacco cessation counseling provided  [x]  no current alcohol use    []  acceptable amount of alcohol use. Counseled on moderation  []  alcohol cessation counseling provided  [x]  no current drug/substance use  []  substance cessation counseling provided    [x]  exercises & discussed recommendations  []  unable to exercise given functional limitations    []  Discussed home safety: rugs, bathroom safety (grab-bars, non-slip pads), handrails for stairs, clutter, slippery surfaces, and safe lighting    []  Diet reviewed and recommendations provided on healthy eating    []  I reviewed patient's social history (see above and note for additional details)     Advanced directive:    []  Yes   []  No     []  On file   []  Reviewed     []  Discussed - see below   []  Materials given        # Healthcare Maintenance   []  Refer for mammogram   []  Refer for DXA   []  PAP smear today   []  Order FIT test   []  Refer for colonoscopy   []  Screen for hepatitis C    []  Screen for hepatitis B    []  Check PSA    []  Refer for AAA screening   [x]  Counseled on flu vaccine and COVID booster vaccine   [x]  Vaccinations ordered today:     []  Influenza  [x]  PCCV-20 []  pneumovax-23  (only if had PCCV-13 in past year)           []  shingrix    []  COVID       []   hepatitis B   []  Tdap/Td        Advanced Care Planning/goals of care discussion. I reviewed with Thu Maynez/patient's surrogates today regarding his/her goals of care and advanced care planning on a voluntary basis for the patient/patient's surrogates.      Time spent: []  Initial 30 minutes - at least 16 minutes []  longer than initial 30 minutes    Topics discussed today:   []  Medical conditions  []  Prognosis  []  Goals of care  []  Designation of surrogate medical decision-maker  []  Completion of advanced directives  []  Completion of POLST.     Part A:  []  Attempt resuscitation/CPR  []   Do not attempt resuscitation   Part B:  []  Full treatment  []  Selective treatment  []  Comfort-focused treatment   Part C: []  Long-term artificial nutrition  []  Trial of artificial nutrition []  No artificial nutrition    Please see Goals of Care note today for additional details.         Advice/Referrals:   Subsequent annual wellness visit--at least 12 months since last annual wellness visit.       The additional diagnoses assessed at this visit listed below constituted a separate, identifiable service from the routine preventive health examination.             HOME PT

## 2021-07-07 ENCOUNTER — Ambulatory Visit: Payer: BLUE CROSS/BLUE SHIELD

## 2021-07-07 DIAGNOSIS — R0609 Other forms of dyspnea: Secondary | ICD-10-CM

## 2021-07-07 DIAGNOSIS — Z0000 Encounter for general adult medical examination without abnormal findings: Secondary | ICD-10-CM

## 2021-07-07 DIAGNOSIS — I10 Essential (primary) hypertension: Secondary | ICD-10-CM

## 2021-07-07 DIAGNOSIS — R9439 Abnormal result of other cardiovascular function study: Secondary | ICD-10-CM

## 2021-07-07 MED ORDER — REPATHA SURECLICK 140 MG/ML SC SOAJ
1 | SUBCUTANEOUS | 6 refills | Status: AC
Start: 2021-07-07 — End: ?

## 2021-07-07 NOTE — Consults
CARDIOLOGY NOTE      PATIENT: [name]   MRN: [id number]   DOB: [birth year]   DATE OF SERVICE: 07/07/2021    PCP:  York Ram, MD      Problem List Items Addressed This Visit     Aortic calcification (HCC/RAF)   Other Visit Diagnoses     Essential hypertension    -  Primary    Abnormal stress test        DOE (dyspnea on exertion)                Interval History:     The patient feels SOB/dyspnea, with a recent episode      Prior Encounter:     This is a 86 y.o. female who is here for cardiac evaluation.  Indication:   SOB    The patient is a/w friend, Jacobo Forest    Pt was recently hospitalized with SOB and chest pain.   Pt ambulates with a walker for precautionary reasons given history of falls.   With activity, the patient reports SOB/dypnea. She walks the dog 1/2 block  Her symptoms have been ongoing for the past 3 years, progressive worsening.   The patient notes allergies and asthma.   About 1 year ago, she could walk 1.5 blocks         Past Medical History:   Diagnosis Date   ? Breast cancer (HCC/RAF) 12/20/2018       No past surgical history on file.    Allergies:   Allergies   Allergen Reactions   ? Beta Adrenergic Blockers Other (See Comments)     Avoid d/t Myasthenia Gravis  Avoid d/t Myasthenia Gravis     ? Levofloxacin Other (See Comments)     Flare of Myasthenia Gravis   ? Macrolides And Ketolides Other (See Comments)     Avoid d/t Myasthenia Gravis  Use with caution d/t Myasthenia Gravis  Use with caution d/t Myasthenia Gravis     ? Sulfa Antibiotics Other (See Comments)     May possibly have caused deafness in one ear  May possibly have caused deafness in one ear  other     ? Botulinum Toxins Other (See Comments)     Muscle weakness r/t Myasthenia Gravis  Muscle weakness r/t Myasthenia Gravis     ? Statins Other (See Comments)     Muscle aches  Muscle aches  Muscle aches     ? Plastic Tape Itching and Other (See Comments)     Turns the skin red where applied       Social History     Socioeconomic History   ? Marital status: Widowed   Tobacco Use   ? Smoking status: Former     Types: Cigarettes     Quit date: 1985     Years since quitting: 38.3   ? Smokeless tobacco: Former   Substance and Sexual Activity   ? Alcohol use: Not Currently   ? Drug use: Never       No family history on file.  Adopted     Objective:     BP 129/68  ~ Pulse 91  ~ Wt 137 lb 3.2 oz (62.2 kg)  ~ SpO2 95%  ~ BMI 22.83 kg/m?     Vitals:    07/07/21 1343   BP: 129/68   Pulse: 91   SpO2: 95%   Weight: 137 lb 3.2 oz (62.2 kg)       Body mass  index is 22.83 kg/m?Marland Kitchen    Wt Readings from Last 3 Encounters:   07/07/21 137 lb 3.2 oz (62.2 kg)   07/05/21 138 lb (62.6 kg)   05/17/21 142 lb (64.4 kg)          Physical Exam:   General Exam: no apparent distress.     Neurologic/Psychiatric: alert and oriented. Affect is appropriate.   HEENT: Sclerae are anicteric. There is no epistaxis.   Cardiac: regular in rate and rhythm.     Respiratory: non-labored breathing. No use of accessory muscles. No wheeze.    Abdomen: bowel sounds are present. Abdomen is soft. No rebound.    Extremities: no edema. No cyanosis or clubbing.     Integument: no petechiae. No acute rash.         Laboratory:  Lab Results   Component Value Date    NA 136 03/16/2021    K 4.5 03/16/2021    CL 101 03/16/2021    CO2 25 03/16/2021    BUN 24 (H) 03/16/2021    CREAT 1.37 (H) 03/16/2021    GLUCOSE 95 03/16/2021    CALCIUM 9.4 03/16/2021     Lab Results   Component Value Date    TROPONIN 0.05 (H) 12/19/2020    TROPONIN 0.07 (H) 12/19/2020    TROPONIN 0.06 (H) 12/18/2020     Lab Results   Component Value Date    BNP 156 (H) 06/03/2021     Lab Results   Component Value Date    WBC 6.14 03/16/2021    HGB 12.4 03/16/2021    HCT 36.4 03/16/2021    MCV 98.9 (H) 03/16/2021    PLT 434 (H) 03/16/2021     Lab Results   Component Value Date    CHOL 199 06/03/2021    CHOLHDL 60 06/03/2021    CHOLDLQ 128 (H) 06/03/2021    TRIGLY 92 06/03/2021     Lab Results   Component Value Date    PT 12.9 12/18/2020    INR 1.0 12/18/2020     No components found for: HA1C  Lab Results   Component Value Date    TSH 1.5 03/16/2021       Patient Active Problem List   Diagnosis   ? Acute medial meniscal tear, left, subsequent encounter   ? Angioedema   ? Anxiety   ? Asthmatic bronchitis, mild persistent, with acute exacerbation   ? Chronic cholecystitis   ? Chronic interstitial cystitis   ? CKD (chronic kidney disease), stage III (HCC/RAF)   ? COPD mixed type (HCC/RAF)   ? Major depression, recurrent, chronic (HCC/RAF)   ? Diabetes mellitus (HCC/RAF)   ? Esophageal reflux   ? Eustachian tube dysfunction   ? Fibromyalgia   ? H/O arthroscopy   ? Insomnia   ? Myasthenia gravis with exacerbation (HCC/RAF)   ? Nocturnal hypoxemia   ? Osteoarthritis of knee   ? Renal cyst   ? Seasonal and perennial allergic rhinitis   ? Spondylosis without myelopathy or radiculopathy, lumbar region   ? Urethral caruncle   ? Vaginal atrophy   ? Benzodiazepine dependence (HCC/RAF)   ? Breast cancer (HCC/RAF)   ? Refractive error   ? Ptosis of eyelid   ? Pseudophakia of both eyes   ? Aortic calcification (HCC/RAF)   ? HTN (hypertension)   ? Immunosuppressed status (HCC/RAF)   ? Acute chest pain   ? Atypical chest pain       Outpatient Medications Prior to  Visit   Medication Sig   ? Albuterol Sulfate AEPB Takes 1 to 2 puffs every 4 hours as needed for wheezing or SOB Inhale Takes 1 to 2 puffs every 4 hours as needed for wheezing or SOB ..   ? anastrozole 1 mg tablet Take 1 tablet (1 mg total) by mouth daily.   ? ascorbic acid 500 mg tablet Take 1 tablet (500 mg total) by mouth two (2) times daily with meals.   ? aspirin 81 mg EC tablet Take 1 tablet (81 mg total) by mouth daily.   ? azaTHIOprine 50 mg tablet Take one and one-half tablet (75 mg total) by mouth two (2) times daily.   ? buPROPion, SR, (WELLBUTRIN SR) 200 mg 12 hr tablet Take 1 tablet (200 mg total) by mouth two (2) times daily.   ? clonazePAM 1 mg tablet 1 mg in pm.   ? DEXILANT 60 MG DR capsule TAKE 1 CAPSULE (60 MG TOTAL) BY MOUTH DAILY   ? DULoxetine 20 mg DR capsule Take 3 capsules (60 mg total) by mouth daily.   ? estradiol (ESTRACE) 0.1 mg/g vaginal cream Place 1 g vaginally three (3) times a week.   ? fluticasone-vilanterol (BREO ELLIPTA) 100-25 mcg/inh inhaler Inhale 1 puff daily.   ? GAMUNEX-C 20 GM/200ML infusion    ? losartan 50 mg tablet Take 1 tablet (50 mg total) by mouth daily.   ? meclizine 25 mg tablet Take 1 tablet (25 mg total) by mouth three (3) times daily as needed for Dizziness.   ? montelukast 10 mg tablet Take 1 tablet (10 mg total) by mouth as needed for.   ? PYRIDOSTIGMINE 60 mg tablet TAKE 1 TABLET (60 MG TOTAL) BY MOUTH THREE (3) TIMES DAILY AS NEEDED   ? trimethoprim 100 mg tablet Take 1 tablet (100 mg total) by mouth daily.     Facility-Administered Medications Prior to Visit   Medication   ? lidocaine 2% inj 5 mL   ? lidocaine 2% inj 5 mL   ? lidocaine-EPINEPHrine 1 %-1:100000 inj 4.8 mL   ? lidocaine-EPINEPHrine 1 %-1:100000 inj 5 mL       Current Outpatient Medications   Medication Sig   ? Albuterol Sulfate AEPB Takes 1 to 2 puffs every 4 hours as needed for wheezing or SOB Inhale Takes 1 to 2 puffs every 4 hours as needed for wheezing or SOB ..   ? anastrozole 1 mg tablet Take 1 tablet (1 mg total) by mouth daily.   ? ascorbic acid 500 mg tablet Take 1 tablet (500 mg total) by mouth two (2) times daily with meals.   ? aspirin 81 mg EC tablet Take 1 tablet (81 mg total) by mouth daily.   ? azaTHIOprine 50 mg tablet Take one and one-half tablet (75 mg total) by mouth two (2) times daily.   ? buPROPion, SR, (WELLBUTRIN SR) 200 mg 12 hr tablet Take 1 tablet (200 mg total) by mouth two (2) times daily.   ? clonazePAM 1 mg tablet 1 mg in pm.   ? DEXILANT 60 MG DR capsule TAKE 1 CAPSULE (60 MG TOTAL) BY MOUTH DAILY   ? DULoxetine 20 mg DR capsule Take 3 capsules (60 mg total) by mouth daily.   ? estradiol (ESTRACE) 0.1 mg/g vaginal cream Place 1 g vaginally three (3) times a week.   ? Evolocumab (REPATHA SURECLICK) 140 MG/ML SOAJ Inject 1 pen. under the skin every fourteen (14) days.   ? fluticasone-vilanterol (  BREO ELLIPTA) 100-25 mcg/inh inhaler Inhale 1 puff daily.   ? GAMUNEX-C 20 GM/200ML infusion    ? losartan 50 mg tablet Take 1 tablet (50 mg total) by mouth daily.   ? meclizine 25 mg tablet Take 1 tablet (25 mg total) by mouth three (3) times daily as needed for Dizziness.   ? montelukast 10 mg tablet Take 1 tablet (10 mg total) by mouth as needed for.   ? PYRIDOSTIGMINE 60 mg tablet TAKE 1 TABLET (60 MG TOTAL) BY MOUTH THREE (3) TIMES DAILY AS NEEDED   ? trimethoprim 100 mg tablet Take 1 tablet (100 mg total) by mouth daily.   ? [DISCONTINUED] clonazePAM 1 mg tablet 0.5 mg in am and 1 mg in pm.     No current facility-administered medications for this visit.     Facility-Administered Medications Ordered in Other Visits   Medication Dose Route Frequency   ? lidocaine 2% inj 5 mL  5 mL Intradermal Once   ? lidocaine 2% inj 5 mL  5 mL Intradermal Once   ? lidocaine-EPINEPHrine 1 %-1:100000 inj 4.8 mL  4.8 mL Intradermal Once   ? lidocaine-EPINEPHrine 1 %-1:100000 inj 5 mL  5 mL Intradermal Once             2018 ACC/AHA guidelines recommends high-intensity statin because of ASCVD diagnosis. Patient is intolerant to statin. Consider non-statin Rx or referral.  10-Year ASCVD risk calculator does not apply because patients with ASCVD are recommended to take a high-intensity statin.  as of 4:54 PM on 07/07/2021.  10-Year ASCVD risk with optimal risk factors cannot be calculated.  Values used to calculate ASCVD score:  Age: 86 y.o. Cannot calculate risk because age is not between 94 and 4 years old.  Gender: Female Race: White: Not Listed  HDL cholesterol: 60 mg/dL. HDL cholesterol measured on 06/03/2021.  Total cholesterol: 199 mg/dL. Total cholesterol measured on 06/03/2021.  LDL cholesterol: 128 mg/dL. LDL cholesterol measured on 06/03/2021.  Systolic BP: 129 mm Hg. BP was measured on 07/07/2021.  The patient is being treated with a medication that influences SBP.  The patient is currently not a smoker.  The patient has a diagnosis of diabetes.  Click here for the [facility] ASCVD Cardiovascular Risk Estimator Plus tool Office manager).      Assessment:     Sanaa Zilberman is 86 y.o. female with    Cardiomyopathy, EF 35-40%. Echo in 2023    - avoiding BBlockers due to myasthenia gravis    Abnormal Myoview in 2023: There is a moderate-severe perfusion defect in the distal anteroseptal wall and apex, suggestive for MI.  SOB/dyspnea. Uses oxygen concentrator  Abnormal EKG with LBBB and PACs  Former tobacco use  Subtle calcifications of the aortic root. Moderately calcified thoracic aorta and its main branch vessels.  Intolerant to Statins    Plan:     Losartan 50 mg  ASA    Start Repatha      York Ram., MD  Clelia Croft, MD  Hi Eulamae Greenstein.   She and I had a thoughtful discussion today. ?In light of her breast cancer and her Myasthenia Gravis, it seems like medical management of her CAD is best.         Orders Placed This Encounter   ? ECG 12-Lead Clinic Performed   ? Evolocumab (REPATHA SURECLICK) 140 MG/ML SOAJ           Return in about 6 weeks (around 08/18/2021).  There are no Patient Instructions on file for this visit.    Future Appointments   Date Time Provider Department Center   08/15/2021 11:30 AM NEU [facility] CL[facility]IC [facility] NEUMUSC [address] [place]/Cen   09/01/2021  2:15 PM Clelia Croft, MD CRD DIS [facility]   09/20/2021 11:40 AM York Ram, MD GERI IMS [address]/Cen         Author: Clelia Croft, MD

## 2021-08-15 ENCOUNTER — Ambulatory Visit: Payer: BLUE CROSS/BLUE SHIELD | Attending: Neurology

## 2021-08-15 DIAGNOSIS — G7001 Myasthenia gravis with (acute) exacerbation: Secondary | ICD-10-CM

## 2021-08-15 DIAGNOSIS — M4802 Spinal stenosis, cervical region: Secondary | ICD-10-CM

## 2021-08-15 MED ORDER — AZATHIOPRINE 50 MG PO TABS
75 mg | ORAL_TABLET | Freq: Two times a day (BID) | ORAL | 3 refills | Status: AC
Start: 2021-08-15 — End: ?

## 2021-08-15 NOTE — Patient Instructions
-   Referral for PT regarding neck exercises   - Referral for Accupuncture  - Physical Therapy referral for gait and balance training for cervical spinal stenosis   - Contact number for scheduling: [phone]  - Continue IVIg 60g (1g/kg) frequency to every 3 weeks at home with Allure  - Continue Azathioprine 75 mg twice per day by mouth

## 2021-08-15 NOTE — Progress Notes
[facility] Progress Note  PATIENT: [name]  MRN:  [id number]  DOB:  12-Jan-1936  DATE OF SERVICE:  08/15/2021  ATTENDING PHYSICIAN: No att. providers found  PRIMARY CARE PHYSICIAN: Kaitlyn Ram., MD    ID/CC: [name] is a 86 y.o. 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    Interval Events:    Last clinic visit was on 02/14/2021.    Since then, [name] has been receiving IVIg every 3 weeks at home with [facility]. She used to have her IVIg infusions every 2 weeks and was exhausted from frequency. She usually has a headache after infusions. The more tired she is, the more symptoms she has. She notes she slurs her words when she gets tired and has left eye droop. She is taking Azothioprine 75mg  BID and has not completed PT. She completed a MRI of C Spine for her neck weakness, wears oxygen, and stopped accupuncture 1 year ago.      Myasthenia Gravis Activities of Daily Living (MG-ADL)  None = 0  Mild = 1  Moderate = 2  Severe = 3     Talking:  None = 0: Normal  Mild = 1: Intermittent slurring or nasal speech  Moderate = 2: Constant slurring or nasal speech, but can be understood  Severe = 3: Difficult to understand speech     Chewing:  None = 0: Normal  Mild = 1: Fatigue with solid food  Moderate = 2: Fatigue with soft food  Severe = 3: Gastric tube     Swallowing:  None = 0: Normal  Mild = 1: Rare episode of choking  Moderate = 2: Frequent choking necessitating changes in diet  Severe = 3: Gastric tube     Breathing:  None = 0: Normal  Mild = 1: Shortness of breath with exertion  Moderate = 2: Shortness of breath at rest  Severe = 3: Ventilator dependance     Impairment of ability to brush teeth or comb:  None = 0: None  Mild = 1: Extra effort, but no rest periods needed  Moderate = 2: Rest periods needed  Severe = 3: Cannot do one of these functions     Impairment of ability to arise from a chair:  None = 0: None  Mild = 1: Mild, sometimes uses arms  Moderate = 2: Moderate, always uses arms  Severe = 3: Severe, requires assistance     Double vision:  None = 0: None  Mild = 1: Occurs, but not daily  Moderate = 2: Daily, but not constant  Severe = 3: Constant     Eyelid droop  None = 0: None  Mild = 1: Occurs, but not daily  Moderate = 2: Daily, but not constant  Severe = 3: Constant     TOTAL SCORE: 7 on 08/15/2021     Myasthenia Gravis Foundation 15 (MG-QoL1 15r)  Not at all = 0  A little bit = 1  A lot = 2     1. I am frustrated by my MG. 2  2. I have trouble with my eyes because of my MG (e.g., double vision). 2  3. I have trouble eating because of my MG. 1  4. I have limited my social activity because of my MG. 2  5. My MG limits my ability to enjoy hobbies and fun  activities. 1  6. I have trouble meeting the needs of my family because of my MG. 0  7. I have to make plans around my MG. 2  8. I am bothered by limitations in performing my work (including work at home) because of my MG. 2  9. I have difficulty speaking due to my MG. 2   10. I have lost some personal independence because of my MG (e.g., driving, shopping, running errands). 1  11. I am depressed about my MG. 1  12. I have trouble walking due to my MG. 2  13. I have trouble getting around public places because of my MG. 2  14. I feel overwhelmed by my MG. 1  15. I have trouble performing my personal grooming needs due to my MG. 1      TOTAL SCORE: 22 on 08/15/2021     Previously Failed  Prednisone - oral ulcers    Past Medical History:  Fibromyalgia    Breast cancer (2017) - contained, no tx   Interstitial cystitis x 30 years   Fall with brief LOC (09/2016)  Fall with knee infection/contusion (10/2018)  Depression and anxiety   Diverticulitis   COPD  Asthma     Past Surgical History:  Meniscus tear repair in left knee    Family History:   Adopted at birth. Birth mother may have had breast cancer.   Brother(58) and sister (57) were both adopted from different families    Social History:   She moved from her independent living place in [place], [place] to [place] with her daugther.   Pt  reports that she quit smoking about 38 years ago. Her smoking use included cigarettes. She has quit using smokeless tobacco. She reports that she does not currently use alcohol. She reports that she does not use drugs. Smoked 0.5 PPD from 1960 -1985. Denies EtOH or recreational drug use. Retired, college-educated, widowed.     Allergies:  Allergies   Allergen Reactions   ? Beta Adrenergic Blockers Other (See Comments)     Avoid d/t Myasthenia Gravis  Avoid d/t Myasthenia Gravis     ? Levofloxacin Other (See Comments)     Flare of Myasthenia Gravis   ? Macrolides And Ketolides Other (See Comments)     Avoid d/t Myasthenia Gravis  Use with caution d/t Myasthenia Gravis  Use with caution d/t Myasthenia Gravis     ? Sulfa Antibiotics Other (See Comments)     May possibly have caused deafness in one ear  May possibly have caused deafness in one ear  other     ? Botulinum Toxins Other (See Comments)     Muscle weakness r/t Myasthenia Gravis  Muscle weakness r/t Myasthenia Gravis     ? Statins Other (See Comments)     Muscle aches  Muscle aches  Muscle aches     ? Plastic Tape Itching and Other (See Comments)     Turns the skin red where applied      Medications:   Outpatient Medications Prior to Visit   Medication Sig   ? Albuterol Sulfate AEPB Takes 1 to 2 puffs every 4 hours as needed for wheezing or SOB Inhale Takes 1 to 2 puffs every 4 hours as needed for wheezing or SOB ..   ? anastrozole 1 mg tablet Take 1 tablet (1 mg total) by mouth daily.   ? aspirin 81 mg EC tablet Take 1 tablet (81 mg total) by mouth daily.   ?  azaTHIOprine 50 mg tablet Take one and one-half tablet (75 mg total) by mouth two (2) times daily.   ? buPROPion, SR, (WELLBUTRIN SR) 200 mg 12 hr tablet Take 1 tablet (200 mg total) by mouth two (2) times daily.   ? clonazePAM 1 mg tablet 1 mg in pm.   ? DEXILANT 60 MG DR capsule TAKE 1 CAPSULE (60 MG TOTAL) BY MOUTH DAILY   ? DULoxetine 20 mg DR capsule Take 3 capsules (60 mg total) by mouth daily.   ? estradiol (ESTRACE) 0.1 mg/g vaginal cream Place 1 g vaginally three (3) times a week.   ? Evolocumab (REPATHA SURECLICK) 140 MG/ML SOAJ Inject 1 pen. under the skin every fourteen (14) days.   ? fluticasone-vilanterol (BREO ELLIPTA) 100-25 mcg/inh inhaler Inhale 1 puff daily.   ? GAMUNEX-C 20 GM/200ML infusion    ? losartan 50 mg tablet Take 1 tablet (50 mg total) by mouth daily.   ? meclizine 25 mg tablet Take 1 tablet (25 mg total) by mouth three (3) times daily as needed for Dizziness.   ? montelukast 10 mg tablet Take 1 tablet (10 mg total) by mouth as needed for.   ? PYRIDOSTIGMINE 60 mg tablet TAKE 1 TABLET (60 MG TOTAL) BY MOUTH THREE (3) TIMES DAILY AS NEEDED   ? trimethoprim 100 mg tablet Take 1 tablet (100 mg total) by mouth daily.     No facility-administered medications prior to visit.     Physical Exam    Last Recorded Vital Signs:    08/15/21 1144   BP: 142/75   Pulse: 94   Temp: 36.3 ?C (97.4 ?F)       Gen: NAD, well-appearing, cooperative, appears younger than stated age.    Neurological Exam  Mental Status  Awake, alert and oriented to person, place and time. Recent and remote memory are intact. Mild dysarthria present. Language is fluent with no aphasia. Attention and concentration are normal. Fund of knowledge is appropriate for level of education.  Cranial Nerves  CN II: Visual fields full to confrontation. Blurry vision but no double vision.  CN III, IV, VI: Extraocular movements intact bilaterally. Sustained upward gaze for 1 minute does not elicit ptosis or diplopia.  CN V: Facial sensation is normal.  CN VII: Full and symmetric facial movement.  CN VIII: Hearing is normal.  CN IX, X: Palate elevates symmetrically. Normal gag reflex.  CN XI: Shoulder shrug strength is normal.  CN XII: Tongue midline without atrophy or fasciculations.  No left ptosis.    Motor  Normal muscle bulk throughout. No fasciculations present. Normal muscle tone.    Neck flexion                           3 (patient reluctant)  Neck extension                      4                                               Right                     Left  Shoulder abduction               4-  4-  Elbow flexion                         4                           4   Elbow extension                    4                           4  Wrist flexion                           4-                          4-  Wrist extension                      4-                          4-  Hip flexion                              4-                          4-  Knee extension                      4-                          4-  Plantarflexion                         4                          4  Dorsiflexion                            4                          4  Lower extremity strength is partly affected by effort.    Sensory  Light touch, PP is normal in upper and lower extremities.   Reflexes                                           Right                      Left  Brachioradialis                    3+                         3+  Biceps  2+                         2+  Triceps                                2+                         2+  Patellar                                3+                         Deferred  Achilles                                1+                         1+  Right pathological reflexes:Ankle clonus absent.  Left pathological reflexes: Ankle clonus absent.    Coordination  Mild bilateral intention tremors.    Gait  Casual gait: Unable to rise from chair without using arms.  Walks slowly with stooped posture and mildly spastic gait.    Labs ?  Labs 08/26/2015: AChR binding (0.63) blocking (28) antibody, ESR 3, CRP 4.9    09/22/2019 11:42 AM  Component Value Ref Range & Units Status   Vitamin B12 217Low   254-1,060 pg/mL Final     Imaging and Diagnostics     DXA 06/17/2019    Impression: The patient has low bone mass, based on the Left Total Hip T-score. The patient has an estimated ten-year risk of hip fracture of 3% and an estimated ten-year risk of major fracture of 13%, based on the [facility] FRAX algorithm.    Repetitive Nerve stimulation in office 12/23/2018: No decrement after stimulation of CN XI and left ulnar nerve     (from neurologist Dr. Eliane Decree note)    MRI/MRA head 09/09/2015:  This MR angiogram of the intracerebral arteries shows the following:  1. ??Very minimal atherosclerotic change within the left posterior cerebral artery that is unlikely to be clinically significant.  2. ??There is a normal variant with the left posterior cerebral artery obtaining its flow from the anterior circulation.  3. ???No aneurysms are seen.  4. ???No new findings compared to the 11/25/2003 MRA. ??  ?  MRI orbit wwo contrast 09/09/2015: This is is a normal MRI of the orbits with and without contrast.    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 Uncovertebral joint osteophytes cause severe right C3-C4, severe right and moderate left C5-C6 and severe left and mild right C6-C7 neural foraminal narrowing. Facet joint degeneration is severe on the left at C2-C3, the right at C3-C4 and C4-C5, and is moderate bilateral and C7-T1.         EMG/NCS 12/23/18 (Dr. Noel Gerold and Dr. Claudie Leach): normal RNS in left ulnar and accessory nerve  EMG/NCS on 09/19/20 (Dr. Claudie Leach): no neuropathy or carpal tunnel syndrome  MRI Cervical Spine, 05/03/2021  IMPRESSION: Redemonstrated congenital cervical spinal canal stenosis with superimposed multilevel degenerative changes, which are mildly progressive compared to prior as above. No evidence of cord compression or cord signal abnormality.      ASSESSMENT & PLAN:  Mrs. Marlatt is a 86 y.o. female with a pmhx of seropositive initially ocular (2017) now generalized myasthenia gravis (AchR binding and blocking +), breast cancer, interstitial cystitis, fibromyalgia, COPD, asthma. He last MG exacerbation was 2020 and received PLEX. She reports being in stable poor functionality and has fatigue which she acknowledges has multiple potential reasons for her increased fatigue and decreased endurance capacity including MG, deconditioning, fibromyalgia.        For her neck weakness, a referral to PT has been provided for neck exercises as well as a referral for accupuncture. A referral for PT was provided for gait and balance training due to her cervical spinal stenosis. For her MG, we will continue her IVIg infusions and Azothioprine since she remains stable.       Plan/Recommendations:  - Referral for PT regarding neck exercise  - Referral for Accupuncture  - Physical Therapy referral for gait and balance training for cervical spinal stenosis   - Contact number for scheduling: [phone]  - Continue IVIg 60g (1g/kg) frequency to every 3 weeks at home with [facility]  - Continue Azathioprine 75 mg twice per day by mouth       F/u in 6 mos    Seen with Dr. Claudie Leach.    Shyniece Scripter C. Kevontay Burks FNP-BC      Note: greater than 45 minutes spent on this encounter in consultation and in coordination of care. fibromyalgia.      Although she has residual ptosis, her neck weakness and gait problem is likely partly from the cervical spinal stenosis. She is likely not a good surgical candidate for her comorbidity and advanced age, and she is not interested in the neck surgery at this time. Will repeat MRI C-spine and refer her to physical therapy for cervical spinal stenosis. Meanwhile, she is stable from MG standpoint, and will reduce the IVIG frequency from q2weeks to q3weeks.    Plan/Recommendations  - MRI C-spine without contrast  - Physical Therapy referral for gait and balance training for cervical spinal stenosis  - ChangeIVIG 60g (1g/kg) frequency to every 3 weeks ([place])  - Continue Azathioprine 75 mg BID (refilling today)    Note: greater than 40 minutes spent on this encounter in consultation and in coordination of care.    Patient was seen and discussed with Dr. Claudie Leach.    Para Skeans, MD  Neuromuscular Fellow    The patient was seen and examined with Dr. Para Skeans, Neuromuscular Medicine Fellow. I was present for the key portions of the history and physical examination. We formulated the assessment and plan together. I agree with the note above, which reflects the details of our evaluation.    Jake Shark, M.D. Ph.D.

## 2021-08-24 ENCOUNTER — Telehealth: Payer: BLUE CROSS/BLUE SHIELD

## 2021-08-24 NOTE — Telephone Encounter
I sent message to [facility] to let us know if there's anything else they need to continue pt's IVIG.  Response below:

## 2021-09-01 ENCOUNTER — Ambulatory Visit: Payer: BLUE CROSS/BLUE SHIELD

## 2021-09-01 DIAGNOSIS — I5022 Chronic systolic (congestive) heart failure: Secondary | ICD-10-CM

## 2021-09-01 DIAGNOSIS — I10 Essential (primary) hypertension: Secondary | ICD-10-CM

## 2021-09-01 NOTE — Consults
CARDIOLOGY NOTE      PATIENT: [name]   MRN: [id number]   DOB: Jan 07, 1936   DATE OF SERVICE: 09/01/2021    PCP:  York Ram., MD      Problem List Items Addressed This Visit     Aortic calcification (HCC/RAF)   Other Visit Diagnoses     Essential hypertension    -  Primary    Systolic CHF, chronic (HCC/RAF)                Interval History:     The patient feels SOB/dyspnea, with a recent episode  The patient has not started Repatha     Prior Encounter:     This is a 86 y.o. female who is here for cardiac evaluation.  Indication:   SOB    The patient is a/w friend, Jacobo Forest    Pt was recently hospitalized with SOB and chest pain.   Pt ambulates with a walker for precautionary reasons given history of falls.   With activity, the patient reports SOB/dypnea. She walks the dog 1/2 block  Her symptoms have been ongoing for the past 3 years, progressive worsening.   The patient notes allergies and asthma.   About 1 year ago, she could walk 1.5 blocks         Past Medical History:   Diagnosis Date   ? Breast cancer (HCC/RAF) 12/20/2018       No past surgical history on file.    Allergies:   Allergies   Allergen Reactions   ? Beta Adrenergic Blockers Other (See Comments)     Avoid d/t Myasthenia Gravis  Avoid d/t Myasthenia Gravis     ? Levofloxacin Other (See Comments)     Flare of Myasthenia Gravis   ? Macrolides And Ketolides Other (See Comments)     Avoid d/t Myasthenia Gravis  Use with caution d/t Myasthenia Gravis  Use with caution d/t Myasthenia Gravis     ? Sulfa Antibiotics Other (See Comments)     May possibly have caused deafness in one ear  May possibly have caused deafness in one ear  other     ? Botulinum Toxins Other (See Comments)     Muscle weakness r/t Myasthenia Gravis  Muscle weakness r/t Myasthenia Gravis     ? Statins Other (See Comments)     Muscle aches  Muscle aches  Muscle aches     ? Plastic Tape Itching and Other (See Comments)     Turns the skin red where applied       Social History Socioeconomic History   ? Marital status: Widowed   Tobacco Use   ? Smoking status: Former     Types: Cigarettes     Quit date: 1985     Years since quitting: 38.5   ? Smokeless tobacco: Former   Substance and Sexual Activity   ? Alcohol use: Not Currently   ? Drug use: Never       No family history on file.  Adopted     Objective:     BP 129/68  ~ Pulse 86  ~ Wt 135 lb 12.8 oz (61.6 kg)  ~ SpO2 93%  ~ BMI 22.60 kg/m?     Vitals:    09/01/21 1417   BP: 129/68   Pulse: 86   SpO2: 93%   Weight: 135 lb 12.8 oz (61.6 kg)       Body mass index is 22.6 kg/m?Marland Kitchen    Wt  Readings from Last 3 Encounters:   09/01/21 135 lb 12.8 oz (61.6 kg)   08/15/21 135 lb (61.2 kg)   07/07/21 137 lb 3.2 oz (62.2 kg)          Physical Exam:   General Exam: no apparent distress.     Neurologic/Psychiatric: alert and oriented. Affect is appropriate.   HEENT: Sclerae are anicteric. There is no epistaxis.   Cardiac: regular in rate and rhythm.     Respiratory: non-labored breathing. No use of accessory muscles. No wheeze.    Abdomen: bowel sounds are present. Abdomen is soft. No rebound.    Extremities: no edema. No cyanosis or clubbing.     Integument: no petechiae. No acute rash.         Laboratory:  Lab Results   Component Value Date    NA 136 03/16/2021    K 4.5 03/16/2021    CL 101 03/16/2021    CO2 25 03/16/2021    BUN 24 (H) 03/16/2021    CREAT 1.37 (H) 03/16/2021    GLUCOSE 95 03/16/2021    CALCIUM 9.4 03/16/2021     Lab Results   Component Value Date    TROPONIN 0.05 (H) 12/19/2020    TROPONIN 0.07 (H) 12/19/2020    TROPONIN 0.06 (H) 12/18/2020     Lab Results   Component Value Date    BNP 156 (H) 06/03/2021     Lab Results   Component Value Date    WBC 6.14 03/16/2021    HGB 12.4 03/16/2021    HCT 36.4 03/16/2021    MCV 98.9 (H) 03/16/2021    PLT 434 (H) 03/16/2021     Lab Results   Component Value Date    CHOL 199 06/03/2021    CHOLHDL 60 06/03/2021    CHOLDLQ 128 (H) 06/03/2021    TRIGLY 92 06/03/2021     Lab Results   Component Value Date    PT 12.9 12/18/2020    INR 1.0 12/18/2020     No components found for: ''HA1C''  Lab Results   Component Value Date    TSH 1.5 03/16/2021       Patient Active Problem List   Diagnosis   ? Acute medial meniscal tear, left, subsequent encounter   ? Angioedema   ? Anxiety   ? Asthmatic bronchitis, mild persistent, with acute exacerbation   ? Chronic cholecystitis   ? Chronic interstitial cystitis   ? CKD (chronic kidney disease), stage III (HCC/RAF)   ? COPD mixed type (HCC/RAF)   ? Major depression, recurrent, chronic (HCC/RAF)   ? Diabetes mellitus (HCC/RAF)   ? Esophageal reflux   ? Eustachian tube dysfunction   ? Fibromyalgia   ? H/O arthroscopy   ? Insomnia   ? Myasthenia gravis with exacerbation (HCC/RAF)   ? Nocturnal hypoxemia   ? Osteoarthritis of knee   ? Renal cyst   ? Seasonal and perennial allergic rhinitis   ? Spondylosis without myelopathy or radiculopathy, lumbar region   ? Urethral caruncle   ? Vaginal atrophy   ? Benzodiazepine dependence (HCC/RAF)   ? Breast cancer (HCC/RAF)   ? Refractive error   ? Ptosis of eyelid   ? Pseudophakia of both eyes   ? Aortic calcification (HCC/RAF)   ? HTN (hypertension)   ? Immunosuppressed status (HCC/RAF)   ? Acute chest pain   ? Atypical chest pain       Outpatient Medications Prior to Visit   Medication Sig   ?  Albuterol Sulfate AEPB Takes 1 to 2 puffs every 4 hours as needed for wheezing or SOB Inhale Takes 1 to 2 puffs every 4 hours as needed for wheezing or SOB ..   ? anastrozole 1 mg tablet Take 1 tablet (1 mg total) by mouth daily.   ? aspirin 81 mg EC tablet Take 1 tablet (81 mg total) by mouth daily.   ? azaTHIOprine 50 mg tablet Take one and one-half tablet (75 mg total) by mouth two (2) times daily.   ? buPROPion, SR, (WELLBUTRIN SR) 200 mg 12 hr tablet Take 1 tablet (200 mg total) by mouth two (2) times daily.   ? clonazePAM 1 mg tablet 1 mg in pm.   ? DEXILANT 60 MG DR capsule TAKE 1 CAPSULE (60 MG TOTAL) BY MOUTH DAILY   ? DULoxetine 20 mg DR capsule Take 3 capsules (60 mg total) by mouth daily.   ? Evolocumab (REPATHA SURECLICK) 140 MG/ML SOAJ Inject 1 pen. under the skin every fourteen (14) days.   ? fluticasone-vilanterol (BREO ELLIPTA) 100-25 mcg/inh inhaler Inhale 1 puff daily.   ? GAMUNEX-C 20 GM/200ML infusion    ? losartan 50 mg tablet Take 1 tablet (50 mg total) by mouth daily.   ? meclizine 25 mg tablet Take 1 tablet (25 mg total) by mouth three (3) times daily as needed for Dizziness.   ? montelukast 10 mg tablet Take 1 tablet (10 mg total) by mouth as needed for.   ? PYRIDOSTIGMINE 60 mg tablet TAKE 1 TABLET (60 MG TOTAL) BY MOUTH THREE (3) TIMES DAILY AS NEEDED   ? trimethoprim 100 mg tablet Take 1 tablet (100 mg total) by mouth daily.     No facility-administered medications prior to visit.       Current Outpatient Medications   Medication Sig   ? Albuterol Sulfate AEPB Takes 1 to 2 puffs every 4 hours as needed for wheezing or SOB Inhale Takes 1 to 2 puffs every 4 hours as needed for wheezing or SOB ..   ? anastrozole 1 mg tablet Take 1 tablet (1 mg total) by mouth daily.   ? aspirin 81 mg EC tablet Take 1 tablet (81 mg total) by mouth daily.   ? azaTHIOprine 50 mg tablet Take one and one-half tablet (75 mg total) by mouth two (2) times daily.   ? buPROPion, SR, (WELLBUTRIN SR) 200 mg 12 hr tablet Take 1 tablet (200 mg total) by mouth two (2) times daily.   ? clonazePAM 1 mg tablet 1 mg in pm.   ? DEXILANT 60 MG DR capsule TAKE 1 CAPSULE (60 MG TOTAL) BY MOUTH DAILY   ? DULoxetine 20 mg DR capsule Take 3 capsules (60 mg total) by mouth daily.   ? Evolocumab (REPATHA SURECLICK) 140 MG/ML SOAJ Inject 1 pen. under the skin every fourteen (14) days.   ? fluticasone-vilanterol (BREO ELLIPTA) 100-25 mcg/inh inhaler Inhale 1 puff daily.   ? GAMUNEX-C 20 GM/200ML infusion    ? losartan 50 mg tablet Take 1 tablet (50 mg total) by mouth daily.   ? meclizine 25 mg tablet Take 1 tablet (25 mg total) by mouth three (3) times daily as needed for Dizziness.   ? montelukast 10 mg tablet Take 1 tablet (10 mg total) by mouth as needed for.   ? PYRIDOSTIGMINE 60 mg tablet TAKE 1 TABLET (60 MG TOTAL) BY MOUTH THREE (3) TIMES DAILY AS NEEDED   ? trimethoprim 100 mg tablet Take 1 tablet (  100 mg total) by mouth daily.     No current facility-administered medications for this visit.             2018 ACC/AHA guidelines recommends high-intensity statin because of ASCVD diagnosis. Patient is intolerant to statin. Consider non-statin Rx or referral.  10-Year ASCVD risk is N/A (Patient has ASCVD). as of 2:47 PM on 09/01/2021.  10-Year ASCVD risk with optimal risk factors cannot be calculated.  Values used to calculate ASCVD score:  Age: 86 y.o. Cannot calculate risk because age is not between 11 and 58 years old.  Gender: Female Race: White: Not Listed  60 mg/dL. (measured on 06/03/2021)  199 mg/dL. (measured on 06/03/2021)  128 mg/dL. (measured on 06/03/2021)  129 mm Hg. (measured on 09/01/2021)  Yes  currently not a smoker  Yes  Click here for the [facility] ASCVD Cardiovascular Risk Estimator Plus tool (online calculator).      Assessment:     Novalee Horsfall is 86 y.o. female with    Cardiomyopathy, EF 35-40%. Echo in 2023    - avoiding BBlockers due to myasthenia gravis    Abnormal Myoview in 2023: There is a moderate-severe perfusion defect in the distal anteroseptal wall and apex, suggestive for MI.  SOB/dyspnea. Uses oxygen concentrator  Abnormal EKG with LBBB and PACs  Former tobacco use  Subtle calcifications of the aortic root. Moderately calcified thoracic aorta and its main branch vessels.  Intolerant to Statins    Plan:     Losartan 50 mg  ASA    Start Repatha      York Ram., MD  Clelia Croft, MD  Hi Johnhenry Tippin.   She and I had a thoughtful discussion today. ?In light of her breast cancer and her Myasthenia Gravis, it seems like medical management of her CAD is best.         Orders Placed This Encounter   ? Lipid Panel   ? Chol,LDL,Quant   ? ALT (SGPT)   ? Potassium   ? eGFR by Creatinine   ? CK, Total   ? ECG 12-Lead Clinic Performed           Return in about 2 months (around 11/02/2021).      There are no Patient Instructions on file for this visit.    Future Appointments   Date Time Provider Department Center   09/20/2021 11:40 AM York Ram., MD GERI [facility] [address]   02/13/2022 11:30 AM NEU MG CLINIC [facility] NEUMUSC [address]         Author: Clelia Croft, MD

## 2021-09-05 MED ORDER — METHENAMINE HIPPURATE 1 G PO TABS
1 g | ORAL_TABLET | Freq: Two times a day (BID) | ORAL | 1 refills | Status: AC
Start: 2021-09-05 — End: ?

## 2021-09-05 MED ORDER — AZATHIOPRINE 50 MG PO TABS
75 mg | ORAL_TABLET | Freq: Two times a day (BID) | ORAL | 3 refills
Start: 2021-09-05 — End: ?

## 2021-09-06 MED ORDER — TRIMETHOPRIM 100 MG PO TABS
ORAL_TABLET | 3 refills | Status: AC
Start: 2021-09-06 — End: ?

## 2021-09-06 MED ORDER — TRIMETHOPRIM 100 MG PO TABS
100 mg | ORAL_TABLET | Freq: Every day | ORAL | 3 refills | Status: AC
Start: 2021-09-06 — End: ?

## 2021-09-06 MED ORDER — WELLBUTRIN SR 200 MG PO TB12
ORAL_TABLET | 3 refills | Status: AC
Start: 2021-09-06 — End: ?

## 2021-09-06 MED ORDER — DEXILANT 60 MG PO CPDR
60 mg | ORAL_CAPSULE | Freq: Every day | ORAL | 3 refills | Status: AC
Start: 2021-09-06 — End: ?

## 2021-09-07 ENCOUNTER — Telehealth: Payer: BLUE CROSS/BLUE SHIELD

## 2021-09-07 NOTE — Telephone Encounter
[name] (Key: [place]) - [id number]  Repatha '140MG'$ /ML syringes  Status: PA Response - Approved~Created: July 11th, 2023 [phone]~Sent: July 13th, 2023    ? Your request has been approved  PA Case: [id number], Status: Approved, Coverage Starts on: 06/08/2021 12:00:00 AM, Coverage Ends on: 03/06/2022 12:00:00 AM.    Pharmacy was notified.

## 2021-09-20 ENCOUNTER — Ambulatory Visit: Payer: BLUE CROSS/BLUE SHIELD | Attending: Geriatric Medicine

## 2021-09-20 DIAGNOSIS — N184 Chronic kidney disease, stage 4 (severe): Secondary | ICD-10-CM

## 2021-09-20 NOTE — Progress Notes
OUTPATIENT GERIATRICS CLINIC NOTE    PATIENT:  [name]   MRN:  [id number]  DOB:  1935-05-06  DATE OF SERVICE:  09/20/2021  PRIMARY CARE PHYSICIAN: York Ram., MD    CHIEF COMPLAINT:   Chief Complaint   Patient presents with   ? routine check up       [facility]:  [facility]    Non- Specialists:    Chaperone status:  No data recorded    HISTORY OF PRESENT ILLNESS     [name] is a 86 y.o. female who presents today for *follow up**. Patient is accompanied by *no one**. History today is per the patient,and review of available recent records in [facility] and Care Everywhere.     Patient is a(n) [x]  reliable []  unreliable historian and additional collateral information was obtained during the visit today from:       Bothered more by tinnitus.      Per chart review, pertinent medical history:  Past Medical History:   Diagnosis Date   ? Breast cancer (HCC/RAF) 12/20/2018     No past surgical history on file.    ALLERGIES     Allergies   Allergen Reactions   ? Beta Adrenergic Blockers Other (See Comments)     Avoid d/t Myasthenia Gravis  Avoid d/t Myasthenia Gravis     ? Levofloxacin Other (See Comments)     Flare of Myasthenia Gravis   ? Macrolides And Ketolides Other (See Comments)     Avoid d/t Myasthenia Gravis  Use with caution d/t Myasthenia Gravis  Use with caution d/t Myasthenia Gravis     ? Sulfa Antibiotics Other (See Comments)     May possibly have caused deafness in one ear  May possibly have caused deafness in one ear  other     ? Botulinum Toxins Other (See Comments)     Muscle weakness r/t Myasthenia Gravis  Muscle weakness r/t Myasthenia Gravis     ? Statins Other (See Comments)     Muscle aches  Muscle aches  Muscle aches     ? Plastic Tape Itching and Other (See Comments)     Turns the skin red where applied        MEDICATIONS     Personally reviewed.    Medications that the patient states to be currently taking   Medication Sig   ? Albuterol Sulfate AEPB Takes 1 to 2 puffs every 4 hours as needed for wheezing or SOB Inhale Takes 1 to 2 puffs every 4 hours as needed for wheezing or SOB ..   ? anastrozole 1 mg tablet Take 1 tablet (1 mg total) by mouth daily.   ? aspirin 81 mg EC tablet Take 1 tablet (81 mg total) by mouth daily.   ? azaTHIOprine 50 mg tablet Take one and one-half tablet (75 mg total) by mouth two (2) times daily.   ? clonazePAM 1 mg tablet 1 mg in pm.   ? DEXILANT 60 MG DR capsule TAKE 1 CAPSULE (60 MG TOTAL) BY MOUTH DAILY   ? DULoxetine 20 mg DR capsule Take 3 capsules (60 mg total) by mouth daily.   ? fluticasone-vilanterol (BREO ELLIPTA) 100-25 mcg/inh inhaler Inhale 1 puff daily.   ? GAMUNEX-C 20 GM/200ML infusion    ? losartan 50 mg tablet Take 1 tablet (50 mg total) by mouth daily.   ? meclizine 25 mg tablet Take 1 tablet (25 mg total) by mouth three (3) times daily  as needed for Dizziness.   ? methenamine hippurate 1 g tablet Take 1 tablet (1 g total) by mouth two (2) times daily.   ? montelukast 10 mg tablet Take 1 tablet (10 mg total) by mouth as needed for.   ? PYRIDOSTIGMINE 60 mg tablet TAKE 1 TABLET (60 MG TOTAL) BY MOUTH THREE (3) TIMES DAILY AS NEEDED   ? TRIMETHOPRIM 100 mg tablet TAKE 1 TABLET (100 MG TOTAL) BY MOUTH DAILY   ? WELLBUTRIN SR 200 MG 12 hr tablet TAKE 1 TABLET (200 MG TOTAL) BY MOUTH TWO (2) TIMES DAILY       SOCIAL HISTORY     Social History     Social History Narrative   ? Not on file        FUNCTIONAL STATUS     BADLs:  IADLs:    Ambulates with   Falls in past year:  Afraid of falling:    GERIATRIC REVIEW OF SYSTEMS     Vision:    []   glasses     []  legally blind  Hearing: []  hearing aids    Nutrition: []  normal   []  impaired  []  vegan  []  vegetarian  []  low salt  []  low-carb   Swallowing: []  impaired   Dentures:   []  yes    Depression:  []  yes   Cognition:     Incontinence: [] urine  []  fecal  []  urine and fecal      ADVANCED CARE PLANNING     Advanced directives on file: []  No  []   Yes ? Completed:     Medical DPOA on file:   []   Yes ?   []   No ? Patient designates ** * to be their surrogate medical decision-maker.         HEALTH CARE MAINTENANCE     IMMUNIZATIONS:   Immunization History   Administered Date(s) Administered   ? COVID-19, mRNA, (Pfizer - Purple Cap) 30 mcg/0.3 mL 03/11/2019, 04/01/2019, 10/29/2019   ? COVID-19, mRNA, bivalent (Pfizer) 30 mcg/0.3 mL (12y and up) 11/25/2020, 07/05/2021   ? COVID-19, mRNA, tris-sucrose (Pfizer - [employer]) 30 mcg/0.3 mL 06/20/2020   ? DTaP 12/31/2015   ? influenza vaccine IM quadrivalent (Fluzone Quad) MDV (72 months of age and older) 11/26/2016   ? influenza vaccine IM quadrivalent adjuvanted (FluAD Quad) (PF) SYR (26 years of age and older) 11/05/2018, 12/01/2019, 11/25/2020   ? influenza, unspecified formulation 05/10/2014, 12/29/2015, 11/26/2016, 04/01/2019, 04/01/2019, 12/01/2019, 12/01/2019, 12/01/2019, 12/01/2019, 11/25/2020, 11/25/2020   ? pneumococcal conjugate vaccine 20-valent (Prevnar 20) 07/05/2021   ? pneumococcal polysaccharide vaccine 23-valent (Pneumovax) 01/10/2019       Mammogram:  DXA:  PAP:  Colonoscopy:      PHYSICAL EXAM     BP 134/75 (BP Location: Right arm, Patient Position: Sitting, Cuff Size: Regular)  ~ Pulse 77  ~ Temp 36.6 ?C (97.9 ?F) (Temporal)  ~ Resp 16  ~ Ht 5' 5'' (1.651 m)  ~ Wt 134 lb (60.8 kg)  ~ SpO2 94% Comment: room air ~ BMI 22.30 kg/m?   Wt Readings from Last 3 Encounters:   09/20/21 134 lb (60.8 kg)   09/01/21 135 lb 12.8 oz (61.6 kg)   08/15/21 135 lb (61.2 kg)         System Check if normal Positive or additional negative findings   GEN  [x]  NAD     Eyes  []  Conj/Lids []  Pupils  []  Fundi   []  Sclerae []  EOM  ENT  []  External ears   []  Otoscopy   []  Gross Hearing    []   External nose   []  Nasal mucosa   []  Lips/teeth/gums    []  Oropharynx    []  Mucus membranes      Neck  []  Inspection/palpation    []  Thyroid     Resp  []  Effort    []  Auscultation       CV  []  Rhythm/rate   []  Murmurs   []  Edema   []  JVP non-elevated    Normal pulses:   []  Radial []  Femoral  []  Pedal     Breast  []  Inspection []  Palpation     GI  []  Bowel sounds    []  Nontender   []  No distension    []  No rebound or guarding   []  No masses   []  Liver/spleen    []  Rectal     GU  F:  []  External []  vaginal wall         []  Cervix     []  mucus        []  Uterus    []  Adnexa   M:  []  Scrotum []  Penis         []  Prostate     Lymph  []  Cervical []  supraclavicular     []  Axillae   []  Groin/inguinal     MSK []  Gait  []  Back     Specify site examined:    []  Inspect/palp []  ROM   []  Stability []  Strength/tone []  Used arms to push up from seated to standing position   Assistive device:  []  single point cane []  quad cane  []  FWW []  rollator walker      Skin  []  Inspection []  Palpation     Neuro  []  Alert and oriented     []  CN2-12 intact grossly   []  DTR      []  Muscle strength      []  Sensation   []  Pronator drift   []  Finger to Nose/Heel to Shin   []  Romberg     Psych  []  Insight/judgement     []  Mood/affect    []  Gross cognition            LABS/STUDIES     LABS:  Lab Results   Component Value Date    WBC 4.94 09/01/2021    WBC 6.14 03/16/2021    WBC 6.40 12/20/2020    HGB 11.8 09/01/2021    HGB 12.4 03/16/2021    HGB 9.7 (L) 12/20/2020    MCV 104.1 (H) 09/01/2021    PLT 342 09/01/2021    PLT 434 (H) 03/16/2021    PLT 278 12/20/2020     Lab Results   Component Value Date    NA 136 09/01/2021    NA 136 03/16/2021    NA 139 12/20/2020    K 4.1 09/01/2021    K 4.5 03/16/2021    K 3.9 12/20/2020    CREAT 1.67 (H) 09/01/2021    CREAT 1.37 (H) 03/16/2021    CREAT 1.16 12/20/2020    GFRESTNOAA 29 06/09/2020    GFRESTNOAA 28 05/18/2020    GFRESTNOAA 30 03/23/2020    GFRESTAA 34 06/09/2020    GFRESTAA 33 05/18/2020    GFRESTAA 35 03/23/2020    CALCIUM 9.9 09/01/2021    CALCIUM 9.4 03/16/2021    CALCIUM 8.5 (L) 12/20/2020     Lab Results   Component  Value Date    ALT 10 09/01/2021    ALT 9 03/16/2021    AST 30 09/01/2021    AST 22 03/16/2021    ALKPHOS 67 09/01/2021    BILITOT 0.2 09/01/2021    ALBUMIN 4.0 09/01/2021    ALBUMIN 3.9 03/16/2021     Lab Results   Component Value Date    TSH 2.7 09/01/2021    TSH 1.5 03/16/2021    TSH 2.8 03/23/2020     Lab Results   Component Value Date    HGBA1C 5.2 09/01/2021    HGBA1C 5.1 03/16/2021    HGBA1C 5.2 03/23/2020     Lab Results   Component Value Date    CHOL 237 09/01/2021    CHOL 199 06/03/2021     Lab Results   Component Value Date    CHOLDLQ 147 (H) 09/01/2021    CHOLDLQ 128 (H) 06/03/2021     No results found for: ''[facility]''  Lab Results   Component Value Date    FE 87 07/14/2019    FERRITIN 57 07/14/2019    FOLATE 9.6 11/25/2019    TIBC 356 07/14/2019     Lab Results   Component Value Date    VITAMINB12 3,994 (H) 11/25/2019    VITAMINB12 217 (L) 09/22/2019     Lab Results   Component Value Date    BNP 156 (H) 06/03/2021    BNP 92 09/22/2019     No results found for: ''PSATOTAL''      STUDIES:    EKG  This data element was independently reviewed and interpreted by me     NSR NO ST or T changes    XR CHEST PA LAT 2V  September 22, 2019?  COMPARISON: [facility]UB December 17, 2018  ?  History: sob  ?  FINDINGS:  ?  Lungs: Clear  Heart/aorta: Normal heart size. The thoracic aorta is mildly calcified, tortuous and ectatic.  Adenopathy: None  Pleura: No effusion  Bones and Chest wall: No acute bony or body wall  findings. Osteopenia and bony maturational changes. Right upper quadrant cholecystectomy clips stable from October 2020  ?  ?  IMPRESSION:  ?  No acute findings or abnormalities related to provided history.      MRI C spine  May 03, 2021  IMPRESSION:  Redemonstrated congenital cervical spinal canal stenosis with superimposed multilevel degenerative changes, which are mildly progressive compared to prior as above. No evidence of cord compression or cord signal abnormality.    ASSESSMENT and PLAN     [name] is a 86 y.o. female who presents today for *follow up**.      #Tinnitus--wondering about TMS for this.  Will request information from [facility] practitioner about efficacy.    #HFrEF--noted on echo-- 2023--Not Worsening.  Chronic.  Will monitor and treat underlying risk factors with medical and lifestyle interventions as warranted by balance between benefits and burdens.   #CAD--discussed medical management alone with shared decision making model May 11, 2021.  Declines staint  #Cervical myelopathy--monitored by neuro--chronic--re-imaged on MRI as above.  CTM.  #HTN--Increased nifedipine CR 60 mg daily. Continue losartan 50 mg.  BP Better.    #CKD--stage 4--chronic--CTM--control BP as tolerated by symptoms of orthostasis and fatigue  #CAD--patient leaning towards medical management.  #Aortic Calcification--noted on chest imaging     September 22, 2019?       --Not Worsening.  Chronic.  Will monitor and treat underlying risk factors with medical and lifestyle interventions as  warranted by balance between benefits and burdens.  #allergic rhinitis--trial flonase  #Macrocytosis--  #Deconditioning--initiate home PT  #GERD--s/p Nisan fundoplication  #COPD--mixed--per PFT's from [name].--albuterol prn.  May resume other inhalders.  #s/p rectocele repair  #s/p cholecystectomy  #Immunosupressed status--medication related.  Chronic. CTM  #Nocturnal hypoxemia--presumably related to MG crisis but has history of COPD. ?Will order nocturnal home O2 study and consider PFT's/Pulmonary evaluation once settled down. ?May need to restart BREO inhaler.  #Myasthenia Gravis--s/p plasmapheresis x5 October 2020, no Thymoma, on Azathioprine 150 daily and mestinon 60 tid.??Saw?Dr. Enedina Finner at [facility]. ?Was considering Soliris.??Ordering IVIG and MRI.  Encourage ongoing neurology follow up.   #IBS--titrate miralax to comfort. ?  #Interstitial cystitis--on daily Trimethoprim 100 for UTI prophylaxis. ?Using bladder instillations prn. OFF topical estrogen due to advice from neurologist re: MG.  To see Dr. Lorenz Coaster.  Suspect recent flare related to breast CA re-diagnosis.  Doubt pseudomonas bactiuria is pathogen at present.  WIll complete current meropenum.  #Major Depression--active but appears better s/p  increase Duloxetine to 60 mg daily.   #Allergic rhinitis--previously on nasal steroids.  #Breast CA--under care.  Patient continues to decline surgery.  Wondering about additional treatment options.  Will direct to med-onc for discussion.  #DM with CKD--stabe 3b--based on labs from [name]. ?FOllow here. ?If BP permits, may consider ARB BUT patient with h/o angioedema so will ONLY initiate this agent with great caution.  Refer to Ophthalmology.  #OA knees--previously getting hyaluronic acid injections.  #Anxiety  #Benzodiazepine Dependence--uncomplicated--chronic.  Encourage patient to decrease intake--ideally to abstaining but will work towards this goal over time.  Not likely to achieve this given ongoing concerns re: breast ca.  #Fibromyalgia--duloxetine as above.   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  #Statin intolerance      FOLLOW-UP     RTC     Future Appointments   Date Time Provider Department Center   11/08/2021  1:15 PM Clelia Croft, MD CRD DIS [facility]   02/13/2022 11:30 AM NEU MG CLINIC [facility] NEUMUSC [address] [place]         The above plan of care, diagnosis, orders, and follow-up were discussed with the patient and/or surrogate. Questions related to this recommended plan of care were answered.    ANALYSIS OF DATA (Needs to meet 1 category for moderate and 2 categories for high LOS)     I have:     Category 1 (Needs 3 for moderate and high LOS)     []  Reviewed []  1 []  2 []  ? 3 unique laboratory, radiology, and/or diagnostic tests noted below    Test/Study:  on date .    []  Reviewed []  1 []  2 []  ? 3 prior external notes and incorporated into patient assessment    I reviewed Dr. 's note in specialty  from date .    []  Discussed management or test interpretation with external provider(s) as noted      []  Ordered []  1 []  2 []  ? 3 unique laboratory, radiology, and/or diagnostic tests noted in A&P    []  Obtained history from independent historian:       Category 2  []  Independently interpreted the test    Category 3  []  Discussed management or test interpretation with external provider(s) as noted      INTERACTION COMPLEXITY and SOCIAL DETERMINANTS of HEALTH       PROBLEM COMPLEXITY   []  New problem with uncertain diagnosis or prognosis (moderate)   []  Multiple stable  chronic problems (moderate)   []  Chronic problem not stable - not controlled, symptomatic, or worsening (moderate)   []  Severe exacerbation of chronic problem (high)   []  New or chronic problem that poses threat to life or bodily function (high)     MANAGEMENT COMPLEXITY   []  Old or external/outside records reviewed   []  Discussion with alternate (proxy) if patient with impaired communication / comprehension ability (e.g., dementia, aphasia, severe hearing loss).   []  Repeated questions (or disagreement) between patient and/among caregivers/family during the visit.  []  Caregiver/patient emotions/behavior/beliefs interfering with implementation of treatment plan.  []  Independent interpretation of test (EKG, Chest XRay)   []  Discussion of case with a consultant physician     RISK LEVEL   []  Prescription drug management (moderate)   []  Minor surgery with CV risk factors or elective major surgery (moderate)   []  Dx or Rx significantly limited by SDoH (inadequate housing, living alone, poor health care access, inappropriate diet; low literacy) (moderate)   []  Major surgery - elective with CV risk factors or emergent (high)   []  Need for hospitalization (high)   []  New DNR or de-escalation of care (high)      SDoH  The diagnosis or treatment of said conditions is significantly limited by the following social determinants of health:  []  Z59.0 Homelessness  []  Z59.1 Inadequate housing  []  Z59.2 Discord with neighbors, lodgers and landlord  []  Z60.2 Problems related to living alone  []  Z59.8 Other problems related to housing and economic circumstances  []  Z59.4 Lack of adequate food and safe drinking water  []  Z59.6 Low income  []  Z59.7 Insufficient social insurance and welfare support  []  Z59.9 Problems related to housing and economic circumstances, unspecified  []  Z75.3 Unavailability and inaccessibility of health care facilities  []  Z75.4 Unavailability and inaccessibility of other helping agencies  []  Z72.4 Inappropriate diet and eating habits  []  Z62.820 Parent-biological child conflict  []  Z63.8 Other specified problems related to primary support group  []  Z55.0 Illiteracy and low level literacy   []  Z56.9 Unspecified problems related to employment        If Billing Based on Time:     I performed the following items on the day of service:    [x]  Preparing to see the patient (e.g., review of tests)  [x]  Obtaining and/or reviewing separately obtained history   [x]  Performing a medically appropriate examination and/or evaluation   [x]  Counseling and educating the patient/family/caregiver   [x]  Ordering medications, tests, or procedures  [x]  Referring and communicating with other healthcare professionals (when not separately reported)  [x]  Documenting clinical information in the EHR  []  Independently interpreting results and communicating results to patient/family/caregiver    I spent the following total amount of time on these tasks on the day of service:  New Patient     Established Patient  []  15-29 minutes - 99202    []  up to 9 minutes - 99211  []  30-44 minutes - 99203     []  10-19 minutes - 99212   []  45-59 minutes - 99204      []  20-29 minutes - 99213   []  60-74 minutes - 99205   [x]  30- minutes - 99214         []  40-55 minutes - 99215    []  I spent an additional ** * 15-minute-increment(s) for a total of * ** minutes on these tasks on the day of service. [phone] for each additional 15 minutes.)  Author: York Ram, MD 09/20/2021      [place]

## 2021-09-22 ENCOUNTER — Ambulatory Visit: Payer: BLUE CROSS/BLUE SHIELD

## 2021-09-22 DIAGNOSIS — C50919 Malignant neoplasm of unspecified site of unspecified female breast: Secondary | ICD-10-CM

## 2021-09-22 DIAGNOSIS — H9319 Tinnitus, unspecified ear: Secondary | ICD-10-CM

## 2021-09-22 NOTE — Addendum Note
Addended by: Zannie Kehr on: 09/22/2021 04:36 PM     Modules accepted: Orders

## 2021-09-23 ENCOUNTER — Ambulatory Visit: Payer: BLUE CROSS/BLUE SHIELD

## 2021-09-26 ENCOUNTER — Telehealth: Payer: BLUE CROSS/BLUE SHIELD

## 2021-09-26 NOTE — Telephone Encounter
-----   Message from Molinda Bailiff, MD sent at 09/22/2021  4:36 PM PDT -----  [name]  Please let her know that I spoke with my colleague and submitted her information for the [place] [place] consultation for her tinnitus.  They should be reaching out.

## 2021-09-26 NOTE — Telephone Encounter
I contacted pt to inform her information was submitted for the [facility] consultation, no answer left a message

## 2021-09-27 ENCOUNTER — Telehealth: Payer: BLUE CROSS/BLUE SHIELD

## 2021-09-27 NOTE — Telephone Encounter
LVM no answer to set up f/u with MD.

## 2021-09-28 NOTE — Telephone Encounter
I contacted pt to relay message, pt confirmed someone already reached out to her.     No further action needed

## 2021-10-11 ENCOUNTER — Ambulatory Visit: Payer: BLUE CROSS/BLUE SHIELD

## 2021-10-11 DIAGNOSIS — N1832 Type 2 diabetes mellitus with stage 3b chronic kidney disease, without long-term current use of insulin (HCC/RAF): Secondary | ICD-10-CM

## 2021-10-11 DIAGNOSIS — E1122 Type 2 diabetes mellitus with diabetic chronic kidney disease: Secondary | ICD-10-CM

## 2021-10-18 ENCOUNTER — Ambulatory Visit: Payer: BLUE CROSS/BLUE SHIELD | Attending: Hematology & Oncology

## 2021-10-18 NOTE — Interdisciplinary
Blood pressure 138/68, pulse (!) 115, temperature 36.3 C (97.3 F), temperature source Forehead, resp. rate 18, height 165.1 cm (5' 5''), weight 59.6 kg (131 lb 6.4 oz), SpO2 (!) 90 %.    Patient presented to clinic for blood draw. Labs drawn via venipuncture from patients left/right arm.

## 2021-10-18 NOTE — Progress Notes
HEMATOLOGY/ONCOLOGY OUTPATIENT NOTE    PATIENT: [name]  MRN: [id number]  DOB: December 30, 1935  DATE OF SERVICE: 10/18/2021  Referring MD: York Ram., MD    Reason for Consultation/ Chief Complaint:  Bilateral breast cancer    HPI   [name] is a 86 y.o. female with PMH including myasthenia gravis, asymmetric hearing loss, fibromyalgia, DM with CKD, major depression, interstitial cystitis, IBS, COPD who presents to establish care for breast cancer.     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 07/19/2017 Screening detected right breast calcifications and distortion in the upper outer quadrant: Biopsy DCIS+ ALH +CSL; 4 cm apart, axilla negative.    Neoadjuvant tamoxifen started 10/30/17, stopped after 4-6 weeks due to dry mouth and mouth sores. Switched to anastrozole 01/29/18, discontinued because it made her throat dry. Declined additional antiestrogen therapy.   Moved on October of 2020 to [place]   ?  [name] then noted left breast pain in April 2022.     Underwent a diagnostic bilateral breast imaging 06/13/2020 which revealed:   ?- Right breast: solid mass with indistinct margins measuring 14 x 4 x 10 mm in the right breast at 1 o'clock located 6 centimeters from the nipple with associated microclip,  not parallel solid mass with irregular margins measuring 8 x 4 x 5 mm seen in the right breast at 12 o'clock, upper outer quadrant located 6 centimeters from the nipple.   ?- Left breast: irregular mass with angular margins measuring 10 x 9 x 9 mm seen in the left breast at 11 o'clock located 5 centimeters from the nipple, axillary lymph node in the left axilla with cortical thickness measuring up to 6 mm.    R breast biopsy at 12:00, 6 cm from nipple on 07/13/2020 revealed invasive ductal carcinoma with lobular and focal cribriform features, grade 2 ER (+) PR (+) HER2 (-), Ki-67 5%    L breast biopsy at 11:00, 5 cm from nipple on 07/13/2020 revealed invasive ductal carcinoma with lobular features, grade 2 ER (+) PR (-) HER2 (-), Ki-67 10%  ?  Breast MRI performed on 08/05/2020 that redemonstrated multifocal right breast malignancy and left breast IDC with an additional 4.3cm of NME that spanned nearly the entire left breast that appears suspicious.     [facility] genetic testing (-) for pathogenic variants.     08/17/2020 Patient presents to establish care for breast cancer. She has seen Dr. Janee Morn to discuss surgical treatment, and is quite worried about anesthesia with her myasthenia gravis . She is interested in neoadjuvant therapy     09/13/2020 Patient presents for follow-up of breast cancer. PETCT on 08/22/2020 with no evidence of FDG avid metastatic disease. DXA scan on 08/26/2020 demonstrated osteopenia of the left femoral neck and total hip.     10/28/2020 Patient presents for follow-up of breast cancer. Labs on 09/13/2020 showed WBC 5.3, hgb 11.4, platelets 300, ANC 3.58. Endorses fatigue, frequent diarrhea. BM anytime she eats, usually about 3 times a day. Diarrhea is more frequent in the morning. Also reports hot flashes. Breasts are tender, using a larger bra size.     11/25/2020 Patient presents for follow-up of breast cancer. Labs on 9/2/202 showed WBC 6.7, hgb 12.0, platelets 384,  ANC 4.80. Breast MRI on 11/18/2020 with stable to slightly decreased size of known biopsy proven malignancies in the right breast, decreased size of the irregular mass with microclip at 11:00, 5 cm  from the nipple, consistent with known malignancy, nonmass enhancement spanning 40 mm has also slightly decreased in size and now demonstrates subthreshold kinetics, this remains suspicious, previously described oval enhancing mass at 4:00 is no longer identified.     01/03/2021 Patient presents for follow-up of breast cancer. Labs on 12/20/2020 showed WBC 6.4, hgb 9.7, platelets 278, ANC 5.00.     Hospital admission 10/23-10/25/2022. P/w?atypical?chest pain x 2-3 days?with new LBBB but negative troponin and relief w/ antacid medications most c/f upper GI pathology such as severe GERD, PUD, or gastritis.?CT AP without acute abnormality. Initiated pantoprazole 40 mg PO bid with improvement in symptoms with PPI and GI cocktail w/maalox. Patient able to tolerate all meals without recurrent chest pain. GI was consulted and given improvement in symptoms, EGD deferred. Continue?Maalox q4h PRN. H pylori stool Ag discontinued given long term PPI use. Continue dexelant outpatient. Ondansetron?4 mg PO q6hr PRN Rx for nausea. Outpatient GI follow-up.     10/18/2021 Patient presents for follow-up of breast cancer. Labs on 09/01/2021 showed WBC 4.94, hgb 11.8, platelets 342. Breast MRI 05/03/2021 with Interval decrease in size of known malignancies in the right breast with trace residual enhancement, Grossly stable irregular mass at 11 o'clock corresponding to biopsy-proven malignancy,  Interval resolution of previously described regional nonmass enhancement in the upper inner, upper outer, and lower outer quadrants. She admits to persistent diarrhea associated with significant weight loss. She often does not eat during the day because she is fearful of leaving the house d/t diarrhea. She does not take much imodium. The diarrhea is dependent on what she eats. Has longstanding hx of IBS. Diarrhea is improved when she takes dexilant but was unable to obtain the dexilant in the preferred form for a while. She denies B symptoms. Admits to occasional vomiting associated with the diarrhea. Continues with IVIG for myasthenia gravis. Has decided she would not like to pursue breast surgery due to age, co morbidities, anaesthetic risks.        ECOG performance status: 0  PAST HISTORY   PMH:  Patient Active Problem List    Diagnosis Date Noted   ? Atypical chest pain 12/20/2020   ? Acute chest pain 12/18/2020   ? Immunosuppressed status (HCC/RAF) 03/21/2020   ? HTN (hypertension) 10/31/2019   ? Aortic calcification (HCC/RAF) 09/25/2019   ? Refractive error 04/28/2019   ? Ptosis of eyelid 04/28/2019   ? Pseudophakia of both eyes 04/28/2019   ? Benzodiazepine dependence (HCC/RAF) 12/20/2018   ? Breast cancer (HCC/RAF) 12/20/2018   ? Spondylosis without myelopathy or radiculopathy, lumbar region 11/28/2018   ? Anxiety 11/27/2018   ? CKD (chronic kidney disease), stage III (HCC/RAF) 11/27/2018   ? Nocturnal hypoxemia 04/07/2018     Last Assessment & Plan:   We discussed potential impact of hypoxemia from her COPD on other body function  Plan-overnight oximetry     ? Major depression, recurrent, chronic (HCC/RAF) 12/16/2017   ? Diabetes mellitus (HCC/RAF) 12/16/2017   ? Osteoarthritis of knee 05/17/2017   ? H/O arthroscopy 03/06/2017   ? Myasthenia gravis with exacerbation (HCC/RAF) 12/18/2016   ? Vaginal atrophy 08/16/2016   ? Acute medial meniscal tear, left, subsequent encounter 07/18/2016   ? Renal cyst 03/12/2016   ? Asthmatic bronchitis, mild persistent, with acute exacerbation 12/29/2015     Last Assessment & Plan:   Continue rescue inhaler as needed. Try adding sample Bevespi maintenance inhaler to see if this helps.  She is currently on prednisone 20 mg  daily maintenance and I don't think adding a steroid inhaler component will make any difference.     ? Urethral caruncle 07/26/2015   ? Chronic cholecystitis 01/14/2015   ? Angioedema 05/23/2014     Attributed to injected methylprednisolone    Last Assessment & Plan:   We discussed limited ability to test for possible triggers. We are asking our labs sources about ability to test against methylprednisolone for injection.  A different brand might be worth trying. She can try pre-medicating with an antihistamine.     ? Chronic interstitial cystitis 10/01/2011   ? Insomnia 05/31/2007     Qualifier: Diagnosis of   By: Maple Hudson MD, Clinton D    Last Assessment & Plan:   I'm concerned that some of her sleep problems may reflect obstructive sleep apnea based on her palate and which she can tell me about her sleep patterns without a witness. We also want to know about oxygenation during sleep.  Plan-schedule sleep study  Qualifier: Diagnosis of   By: Maple Hudson MD, Clinton D      Last Assessment & Plan:   I'm concerned that some of her sleep problems may reflect obstructive sleep apnea based on her palate and which she can tell me about her sleep patterns without a witness. We also want to know about oxygenation during sleep.  Plan-schedule sleep study     ? COPD mixed type (HCC/RAF) 04/30/2007     Office Spirometry 03/16/2016-severe obstructive airways disease FVC 1.59/59%, FEV1 0.98/49%, ratio 0.62, FEF 25-75% 0.42/28%  PFT 04/16/16-severe obstructive airways disease with slight response to bronchodilator, severe diffusion defect. FEV1/FVC 0.64, DLCO 45%    Last Assessment & Plan:   Severe obstructive airways disease. She can't hear herself wheeze.  Plan-try samples of Trelegy instead of Anoro for comparison    Overview:   Office Spirometry 03/16/2016-severe obstructive airways disease FVC 1.59/59%, FEV1 0.98/49%, ratio 0.62, FEF 25-75% 0.42/28%  PFT 04/16/16-severe obstructive airways disease with slight response to bronchodilator, severe diffusion defect. FEV1/FVC 0.64, DLCO 45%    Last Assessment & Plan:   Severe obstructive airways disease. She can't hear herself wheeze.  Plan-try samples of Trelegy instead of Anoro for comparison  Office Spirometry 03/16/2016-severe obstructive airways disease FVC 1.59/59%, FEV1 0.98/49%, ratio 0.62, FEF 25-75% 0.42/28%  PFT 04/16/16-severe obstructive airways disease with slight response to bronchodilator, severe diffusion defect. FEV1/FVC 0.64, DLCO 45%    Last Assessment & Plan:   Severe COPD but mainly emphysema with limited bronchospasm component that could be addressed with bronchodilators.  Plan-try sample Bevespi as a maintenance controller, refill pro-air     ? Esophageal reflux 04/30/2007     History of fundoplication     Last Assessment & Plan:   Emphasized continued attention to antireflux measures. Reminded that reflux can be a significant trigger for irritable airways, cough and wheeze.  History of fundoplication       Last Assessment & Plan:   Emphasized continued attention to antireflux measures. Reminded that reflux can be a significant trigger for irritable airways, cough and wheeze.     ? 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 Management is mostly by her ENT physicians.     ? Fibromyalgia 04/30/2007     Qualifier: Diagnosis of   By: Yetta Barre CNA/MA, Rose Fillers:  Diagnosis of   By: Yetta Barre CNA/MA, Shanda Bumps     ? Seasonal and perennial allergic rhinitis 04/30/2007     Qualifier: Diagnosis of   By: Yetta Barre CNA/MA, Shanda Bumps     Last Assessment & Plan:   She is now on prednisone 20 mg daily. I suggested she wait until her myasthenia status is stabilized. Then if she needs to she couldn't seek evaluation at one of the allergy practices in town as discussed, since our vaccine program will be closing.  Qualifier: Diagnosis of   By: Yetta Barre CNA/MA, Jessica       Last Assessment & Plan:   She is now on prednisone 20 mg daily. I suggested she wait until her myasthenia status is stabilized. Then if she needs to she couldn't seek evaluation at one of the allergy practices in town as discussed, since our vaccine program will be closing.     Tobacco: smoked  For 30 years     Surgical History:  No past surgical history on file.    Gyn Hx:  Menses 15   G2P2- first birth 61  Menopause :50's   Menopausal status: Postmenopausal  Hormone therapy use: None      Family and Social History  No family history on file.  Social History     Tobacco Use   ? Smoking status: Former     Types: Cigarettes     Quit date: 1985     Years since quitting: 38.6   ? Smokeless tobacco: Former   Substance Use Topics   ? Alcohol use: Not Currently MEDS     No outpatient medications have been marked as taking for the 10/18/21 encounter (Appointment) with Fredda Hammed., MD.     Allergies   Allergen Reactions   ? Beta Adrenergic Blockers Other (See Comments)     Avoid d/t Myasthenia Gravis  Avoid d/t Myasthenia Gravis     ? Levofloxacin Other (See Comments)     Flare of Myasthenia Gravis   ? Macrolides And Ketolides Other (See Comments)     Avoid d/t Myasthenia Gravis  Use with caution d/t Myasthenia Gravis  Use with caution d/t Myasthenia Gravis     ? Sulfa Antibiotics Other (See Comments)     May possibly have caused deafness in one ear  May possibly have caused deafness in one ear  other     ? Botulinum Toxins Other (See Comments)     Muscle weakness r/t Myasthenia Gravis  Muscle weakness r/t Myasthenia Gravis     ? Statins Other (See Comments)     Muscle aches  Muscle aches  Muscle aches     ? Plastic Tape Itching and Other (See Comments)     Turns the skin red where applied       PHYSICAL EXAM     There were no vitals filed for this visit.   There is no height or weight on file to calculate BMI.  Wt Readings from Last 3 Encounters:   09/20/21 60.8 kg (134 lb)   09/01/21 61.6 kg (135 lb 12.8 oz)   08/15/21 61.2 kg (135 lb)       Review of Systems:  Pertinent items are noted in HPI.      Physical Exam:  General: No acute distress. Appears well-developed, well-nourished and close to stated age.   Head: Normocephalic, atraumatic.  Eyes: Sclera anicteric. EOMI.  ENT: Hearing grossly normal bilaterally. Oropharynx is clear, mucus membranes are moist.  No oral ulcers  noted. Good dentition.  Neck: Supple. Trachea midline.   Cardiac: Regular rate and rhythm. Normal S1, S2. No murmurs, rubs, or gallops.  Respiratory: Clear to auscultation bilaterally. No wheezes, rales, or rhonchi noted. Respiratory effort appears normal.   Abdomen: Soft, nontender and nondistended. Bowel sounds are present and normoactive. No organomegaly is appreciated.  Musculoskeletal: No edema. No cyanosis. Extremities are warm and well-perfused.   Extremities: Warm. No edema. No cyanosis.  Neurologic: Gait appears normal. Sensation intact to light touch in all four extremities. Oriented to person, place and time.   Hematologic: No bruising, purpura or petechiae are noted.   Dermatologic: Skin intact.  No rashes appreciated.   Lymphatic: No palpable cervical, supraclavicular, axillary or inguinal adenopathy appreciated.   Psychiatric: Affect appropriate.  Pleasant and conversant.   Breast exam: no breast masses or axillary adenopathy noted     Recent Labs:  Lab Results   Component Value Date    WBC 4.94 09/01/2021    HGB 11.8 09/01/2021    HCT 35.9 09/01/2021    MCV 104.1 (H) 09/01/2021    PLT 342 09/01/2021      Lab Results   Component Value Date    NA 139 09/20/2021    K 4.5 09/20/2021    CL 104 09/20/2021    CO2 24 09/20/2021    CREAT 1.86 (H) 09/20/2021    BUN 22 09/20/2021     Lab Results   Component Value Date    ALT 10 09/01/2021    AST 30 09/01/2021    ALKPHOS 67 09/01/2021    BILITOT 0.2 09/01/2021     Lab Results   Component Value Date    CALCIUM 9.6 09/20/2021       Pertinent imaging:  05/03/2021 MR breast  1. Right breast:   *  Interval decrease in size of known malignancies in the right breast with trace residual enhancement. Recommend continued surgical and oncological management. BI-RADS CATEGORY 6 - KNOWN BIOPSY-PROVEN MALIGNANCY.   ?  2. Left breast:  *  Grossly stable irregular mass at 11 o'clock corresponding to biopsy-proven malignancy. Recommend continued surgical and oncological management. BI-RADS CATEGORY 6 - KNOWN BIOPSY-PROVEN MALIGNANCY.   *  Interval resolution of previously described regional nonmass enhancement in the upper inner, upper outer, and lower outer quadrants. Recommend continued surgical management. BI-RADS CATEGORY 4 - SUSPICIOUS.   ?  3. Additional findings:  *  STIR hyperintense fluid collection in the right glenohumeral joint/subacromial region may represent bursitis. Correlate with symptoms/physical exam.  ?  OVERALL ASSESSMENT: BI-RADS CATEGORY 4 - SUSPICIOUS.    05/03/2021 MR cervical spine  Redemonstrated congenital cervical spinal canal stenosis with superimposed multilevel degenerative changes, which are mildly progressive compared to prior as above. No evidence of cord compression or cord signal abnormality.  ?  03/14/2021 NM stress test  1) There is a moderate-severe, fixed perfusion defect in the distal anteroseptal wall and apex suggestive for a prior MI.   ?  No scintigraphic evidence for stress-induced ischemia.    ?  2) Normal left ventricular size and reduced global LV systolic function, EF 36%. There are regional wall motion abnormalities present as indicated above.  ?  3) For physiologic and electrocardiographic findings, please refer to a separate report.  ?  4) There are no prior studies available for comparison.      12/18/2020 CT abd/pelvis wo contrast  1.  No acute CT abnormality in the abdomen or pelvis.  2.  Moderate hiatal hernia  with postsurgical changes likely related to reported prior Nissen fundoplication.  3.  Left-sided diverticulosis without evidence diverticulitis.  4.  History of bilateral breast cancer without evidence of metastatic disease within the abdomen or pelvis.    12/18/2020 CTA chest   1.  No acute aortic process. No acute process within the chest.  2.  History of bilateral breast cancer without evidence of intrathoracic metastatic disease.  3.  Additional findings as above.    11/18/2020 MRI breast bilat  1. Right breast:   *  Stable to slightly decreased size of known biopsy proven malignancies in the right breast. BI-RADS CATEGORY 6 - KNOWN BIOPSY-PROVEN MALIGNANCY. Recommend continued surgical and oncologic management.   ?  2. Left breast:  *  Decreased size of the irregular mass with microclip at 11:00, 5 cm from the nipple, consistent with known malignancy. BI-RADS CATEGORY 6 - KNOWN BIOPSY-PROVEN MALIGNANCY. Recommend continued surgical and oncologic management.   *  Nonmass enhancement spanning 40 mm has also slightly decreased in size and now demonstrates subthreshold kinetics. This remains suspicious. If breast conservation is desired, an MRI guided biopsy is recommended. BI-RADS CATEGORY 4 - SUSPICIOUS.  *  Previously described oval enhancing mass at 4:00 is no longer identified. BI-RADS CATEGORY 2.  ?  OVERALL ASSESSMENT: BI-RADS CATEGORY 4 - SUSPICIOUS.    08/26/2020 DXA lumbar spine/hip  -----------------------------------------------------------------   Region ? ? ? ? ? ? ? ? ? BMD ? ?T-score ?Z-score ? Classification   -----------------------------------------------------------------   AP Spine(L1-L4) ? ? ? ? ?1.062 ? ?0.1 ? ? ?2.9 ? ? ? Normal   Femoral Neck (Left) ? ? ?0.723 ? -1.1 ? ? ?1.3 ? ? ? Osteopenia   Total Hip (Left) ? ? ? ? 0.725 ? -1.8 ? ? ?0.4 ? ? ? Osteopenia   -----------------------------------------------------------------   Impression: The BMD for  the AP Spine(L1-L4) decreased, changing by -2.4% since the last DXA exam.     08/22/2020 PETCT  1.  Mild to moderate left greater than right FDG activity associated with small soft tissue densities in the bilateral breasts in the regions marked by breast biopsy clips.   2.  No evidence of FDG avid metastatic disease.    06/19/2019 US pelvis  Small subserosal uterine fundal fibroid. Otherwise normal postmenopausal pelvic ultrasound.        Pertinent pathology:  08/02/2020 [facility] genetics      07/13/2020 Tissue exam  A. BREAST, RIGHT, MASS, 12:00, 6 CM FROM NIPPLE (NEEDLE CORE BIOPSY):  - Invasive ductal carcinoma with lobular and focal cribriform features, grade 2 (50% of biopsy; longest segment 4 mm)             Modified Bloom and Richardson score: 6 of 9                        Tubule formation:                3                        Nuclear pleomorphism:       2                        Mitotic score:                       1  - Ductal carcinoma in situ (DCIS), low  nuclear grade, cribriform type with focal central necrosis  - Breast biomarkers: See below  - ERBB2 (HER2) FISH: Negative for HER2 gene amplification   ?  B.  BREAST, LEFT, MASS, 11:00, 5 CM FROM NIPPLE (NEEDLE CORE BIOPSY):  - Invasive ductal carcinoma with lobular features, grade 2 (50% of biopsy; longest involved segment 6 mm)             Modified Bloom and Richardson score: 6 of 9                        Tubule formation:                3                        Nuclear pleomorphism:       2                        Mitotic score:                       1  - Ductal carcinoma in situ (DCIS) is not identified  - Breast biomarkers: See immunohistochemistry report below  - ERBB2 (HER2) FISH: Negative [based on IHC (2+) and FISH, see comment].     COMMENT: It is uncertain whether patients with an average of >4.0 and <6.0 HER2?signals per cell and a HER2/CEP17 ratio of <2.0 benefit from HER2-targeted therapy in the absence of protein overexpression (IHC 3+). If the specimen test result is close to the [facility] ratio threshold for positive, there is a higher likelihood that repeat testing will result in different results by chance alone. Therefore, when [facility] results are not 3+ positive, it is recommended that the sample be considered negative without additional testing on the specimen.    BREAST BIOMARKER REPORT  ?  BLOCK:  A1  FIXATIVE:  Formalin   ?  RESULT ER/PR:  ? ESTROGEN   RECEPTOR PROGESTERONE RECEPTOR   Antibody Clone SP1 Clone 636   %Tumor Staining >95% >95%   Intensity (1+ to 3+) 3+ 3+   ?  RESULT HER-2/neu IHC assay (utilizing FDA-approved DAKO Hercep Test): Arch Pathol Lab Med [phone], ASCO/CAP HER2 Testing in Breast Cancer Update - Veronia Beets al  ?  Test Score: 1+  ?  HER2/neu:  Negative  ?  RESULT Ki-67 (Clone MIB1):  5%  ?  BREAST BIOMARKER REPORT  ?  BLOCK:  B1  FIXATIVE:  Formalin   ?  RESULT ER/PR:  ? ESTROGEN   RECEPTOR PROGESTERONE RECEPTOR   Antibody Clone SP1 Clone 636   %Tumor Staining >95% 0% Intensity (1+ to 3+) 3+ NA   ?  RESULT HER-2/neu IHC assay (utilizing FDA-approved DAKO Hercep Test): Arch Pathol Lab Med [id number], ASCO/CAP HER2 Testing in Breast Cancer Update - Veronia Beets al  ?  Test Score: 2+  ?  HER2/neu:  Equivocal  ?  RESULT Ki-67 (Clone MIB1):  10%    07/19/2017 Outside pathology        ASSESSMENT & PLAN   [name] is a 86 y.o. female presents for     1. 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 She elected not to have surgery at that time. She took  tamoxifen-->anastrozole for a short period of time, but discontinued endocrine therapy due to side effects.   - R breast biopsy at 12:00, 6 cm from nipple on 07/13/2020 revealed invasive ductal carcinoma with lobular and focal cribriform features, grade 2 ER (+) PR (+) HER2 (-), Ki-67 5%  - L breast biopsy at 11:00, 5 cm from nipple on 07/13/2020 revealed invasive ductal carcinoma with lobular features, grade 2 ER (+) PR (-) HER2 (-), Ki-67 10%  - We had a long discussion about biology and treatment of breast cancer, with emphasis that cure can only be attained with multimodality treatments such as surgery/ +/- radiation and systemic ( endocrine) therapy .  We have discussed that given her concern for anesthesia with Myasthenia Gravis, we cat try neoadjuvant endocrine therapy with close observation of side effects.  - We will start letrozole 2.5 mg po daily 08/17/2020 --> 10/28/2020 discontinued due to diarrhea  - tumors are not palpable on exam, and she would have to be followed with imaging   -  PETCT on 08/22/2020 with no evidence of FDG avid metastatic disease.   - Arimidex 10/28/2020 --->   - Breast MRI on 11/18/2020 with stable to slightly decreased size of known biopsy proven malignancies in the right breast, decreased size of the irregular mass with microclip at 11:00, 5 cm from the nipple, consistent with known malignancy, nonmass enhancement spanning 40 mm has also slightly decreased in size and now demonstrates subthreshold kinetics, this remains suspicious, previously described oval enhancing mass at 4:00 is no longer identified.   - CT CAP on 12/18/2020 without evidence of metastatic disease    10/18/2021   -  Breast MRI 05/03/2021 with Interval decrease in size of known malignancies in the right breast with trace residual enhancement, Grossly stable irregular mass at 11 o'clock corresponding to biopsy-proven malignancy,  Interval resolution of previously described regional nonmass enhancement in the upper inner, upper outer, and lower outer quadrants.   - Continue Arimidex 1 mg po daily- will hold for 2 weeks d/t diarrhea  - Patient has opted against breast surgery d/t age, co morbidities, anaesthetic risk iso myasthenia gravis  - She would like to explore alternatives to surgery such as radiation. Will discuss case with rad/onc team and obtain repeat breast imaging  - Diagnostic bilateral MMG/US ordered to assess status of disease  - See discussion below regarding diarrhea  - Blood work today: CBC, CMP, CEA, CA27.29, Vit D     2.  Ear pain / fullness - in her left ear - resolved   - Augmentin 875 bid  - notified Dr. Brayton El     3.  Myasthenia gravis   - F/u w Dr. Claudie Leach, Neurology, on 08/15/2021. Referral for PT regarding neck exercise. Referral for Acupuncture. Physical Therapy referral for gait and balance training for cervical spinal stenosis. Contact number for scheduling: [phone]. Continue IVIg 60g (1g/kg) frequency to every 3 weeks at home with Allur.Continue Azathioprine 75 mg twice per day by mouth.     4.  Diarrhea  - Longstanding, hx of IBS, but exacerbated by AI use, similar on letrozole and arimidex.  - Consult with Dr. Riley Kill 02/10/2021. May be related to aromatase inhibitor. However she states it has been worse in the last 1-2 months and this medication class was started earlier in the year (attributed diarrhea to letrozole, swithced to arimidex in Sept). Will r/o infection, check FC. Can consider SIBO evaluation, colonoscopy for microscopic colitis.     10/18/2021  -  Hold anastrazole x 2 weeks and evaluate for improvement in diarrhea  - MR Abd/Pelv for further assessment given >20lbs weight loss associated with diarrhea  - Strongly encouraged patient to submit stool studies ordered by Dr. Riley Kill  - If studies are negative, start 2 imodium with each loose stool  - Consider lomotil if imodium is ineffective or not tolerated     5. Bone density  - DXA scan on 08/26/2020 demonstrated osteopenia of the left femoral neck and total hip.      6. Chest pain  - Hospital admission 10/23-10/25/2022. P/w?atypical?chest pain x 2-3 days?with new LBBB but negative troponin and relief w/ antacid medications most c/f upper GI pathology such as severe GERD, PUD, or gastritis.?CT AP without acute abnormality. Initiated pantoprazole 40 mg PO bid with improvement in symptoms with PPI and GI cocktail w/maalox. Patient able to tolerate all meals without recurrent chest pain. GI was consulted and given improvement in symptoms, EGD deferred. Continue?Maalox q4h PRN. H pylori stool Ag discontinued given long term PPI use. Continue dexelant outpatient. Ondansetron?4 mg PO q6hr PRN Rx for nausea.   - Outpatient GI follow-up scheduled w Dr. Riley Kill on 01/12/2021          DIAGNOSES/ORDERS addressed on 10/18/2021:  1. Breast cancer (HCC/RAF)      - on Arimidex - continue close monitoring for toxicity of intensive drug therapy.    NEW PROBLEMS AS OF 10/18/2021:     []  New minor or self-limited problem (99202/99212).  []  2+ minor or self-limited problem, stable chronic illness, acute uncomplicated illness/injury (99203/99213).  []  Chronic illness with exacerbation/progression/tx side effect; 2+ stable chronic illnesses, new problem with uncertain prognosis, acute illness with systemic symptoms, or acute complicated injury (99204/99214).  []  Chronic illness with severe exacerbation/progression/tx side effect, acute/chronic illness or injury that poses threat to life or bodily function (99205/99215).      REVIEW OF DATA   I have  [x]  Reviewed/ordered []  1 []  2 [x]  ? 3 unique laboratory, radiology, and/or diagnostic tests noted below  No orders of the defined types were placed in this encounter.    12/18/2020 CT abd/pelvis wo contrast  12/18/2020 CTA chest   12/20/2020 CBC, BMP    [x]  Reviewed []  1 []  2 [x]  ? 3 prior external notes and incorporated into patient assessment   I reviewed Dr. Velvet Bathe, MD in Surgery, Surgical Oncology on 08/10/2020.  I reviewed Dr. York Ram, MD in Medicine, Internal Medicine on 07/13/2020.  I reviewed Dr. Johnney Killian. Desma Maxim, MD in Obstetrics & Gynecology on 05/07/2019.    []  Discussed management or test interpretation with external provider(s) on 10/18/2021 as noted:    RISK OF COMPLICATION   This writer has deemed the above diagnoses to have a risk of complication, morbidity or mortality of:   []  Minimal  []  Low  []  Moderate   []  due to prescription drug management   [] Decision re: minor surgery   [] Diagnosis or treatment limited by social determinants of health  []  Severe    []  due to intensive monitoring for chemotherapy toxicity   []  Decision elective surgery with risk factors  []  Decision regarding need for hospitalization  []  Advanced Care Planning decisions    SOCIAL DETERMINANTS OF HEALTH   []  The diagnosis or treatment of said conditions is significantly limited by social determinants of health:      I spent 30 minutes counseling and coordinating care for the items checked below on the day of  service 10/18/2021  [x]  Preparing to see the patient (e.g., review of tests)  [x]  Obtaining and or reviewing separately obtained history   [x]  Performing a medically appropriate examination and/or evaluation   [x]  Counseling and educating the patient/family/caregiver   [x]  Ordering medications, tests, or procedures  []  Referring and communicating with other healthcare professionals (when not separately reported)  [x]  Documenting clinical information in the EHR  []  Independently interpreting results and communicating results to patient/ family/caregiver     No follow-ups on file.       The above recommendation were discussed with the patient. The patient has all questions answered satisfactorily and is in agreement with this recommended plan of care.    Attestation:  Orthoptist:  I, Candis Musa, have scribed for [employer]. Oscar La, MD with the documentation for Weronika Birch on 10/18/2021.    Physician Signature:   Rhona Leavens. Oscar La, MD 10/18/2021 8:13 AM    I have reviewed this note, scribed by Candis Musa and attest that it is an accurate representation of my H & P and other events of the outpatient visit except if otherwise noted.

## 2021-10-18 NOTE — Patient Instructions
Please complete stool studies ordered by Dr. Riley Kill  If the studies are negative, you can take 2 imodium after each loose stool  We can consider lomotil (prescription anti-diarrheal) if imodium is not effective  Plan for MRI of the abdomen/pelvis, CT of the chest without contrast, Mammogram and US of the breasts  We will discuss with radiation oncology team  Stop Anastrazole x 2 weeks to evaluate for improvement in diarrhea. Do not stop for longer than 2 weeks

## 2021-10-19 ENCOUNTER — Telehealth: Payer: BLUE CROSS/BLUE SHIELD

## 2021-10-19 NOTE — Telephone Encounter
Left voicemail to schedule consult with Dr. Sheppard Coil, per Dr. Sheppard Coil ''most informative to see her after the new imaging which I see is ordered but not yet scheduled''

## 2021-11-08 ENCOUNTER — Ambulatory Visit: Payer: BLUE CROSS/BLUE SHIELD

## 2021-11-08 DIAGNOSIS — I5022 Chronic systolic (congestive) heart failure: Secondary | ICD-10-CM

## 2021-11-08 DIAGNOSIS — R9439 Abnormal result of other cardiovascular function study: Secondary | ICD-10-CM

## 2021-11-08 DIAGNOSIS — I10 Essential (primary) hypertension: Secondary | ICD-10-CM

## 2021-11-08 NOTE — Consults
CARDIOLOGY NOTE      PATIENT: [name]   MRN: [id number]   DOB: Jul 05, 1935   DATE OF SERVICE: 11/08/2021    PCP:  York Ram., MD      Problem List Items Addressed This Visit     Aortic calcification (HCC/RAF)   Other Visit Diagnoses     Essential hypertension    -  Primary    Systolic CHF, chronic (HCC/RAF)        Abnormal stress test                Interval History:     The patient feels SOB/dyspnea  The patient has started Repatha     Prior Encounter:     This is a 86 y.o. female who is here for cardiac evaluation.  Indication:   SOB    The patient is a/w friend, Jacobo Forest    Pt was recently hospitalized with SOB and chest pain.   Pt ambulates with a walker for precautionary reasons given history of falls.   With activity, the patient reports SOB/dypnea. She walks the dog 1/2 block  Her symptoms have been ongoing for the past 3 years, progressive worsening.   The patient notes allergies and asthma.   About 1 year ago, she could walk 1.5 blocks         Past Medical History:   Diagnosis Date   ? Breast cancer (HCC/RAF) 12/20/2018       No past surgical history on file.    Allergies:   Allergies   Allergen Reactions   ? Beta Adrenergic Blockers Other (See Comments)     Avoid d/t Myasthenia Gravis  Avoid d/t Myasthenia Gravis     ? Levofloxacin Other (See Comments)     Flare of Myasthenia Gravis   ? Macrolides And Ketolides Other (See Comments)     Avoid d/t Myasthenia Gravis  Use with caution d/t Myasthenia Gravis  Use with caution d/t Myasthenia Gravis     ? Sulfa Antibiotics Other (See Comments)     May possibly have caused deafness in one ear  May possibly have caused deafness in one ear  other     ? Botulinum Toxins Other (See Comments)     Muscle weakness r/t Myasthenia Gravis  Muscle weakness r/t Myasthenia Gravis     ? Statins Other (See Comments)     Muscle aches  Muscle aches  Muscle aches     ? Plastic Tape Itching and Other (See Comments)     Turns the skin red where applied       Social History Socioeconomic History   ? Marital status: Widowed   Tobacco Use   ? Smoking status: Former     Types: Cigarettes     Quit date: 1985     Years since quitting: 38.7   ? Smokeless tobacco: Former   Substance and Sexual Activity   ? Alcohol use: Not Currently   ? Drug use: Never       No family history on file.  Adopted     Objective:     BP 158/74  ~ Pulse 98  ~ Wt 133 lb (60.3 kg)  ~ SpO2 97%  ~ BMI 22.13 kg/m?     Vitals:    11/08/21 1340   BP: 158/74   Pulse: 98   SpO2: 97%   Weight: 133 lb (60.3 kg)       Body mass index is 22.13 kg/m?Marland Kitchen  Wt Readings from Last 3 Encounters:   11/08/21 133 lb (60.3 kg)   10/18/21 131 lb 6.4 oz (59.6 kg)   09/20/21 134 lb (60.8 kg)          General Exam: no apparent distress.    Neurologic/Psychiatric: alert and oriented. Affect is appropriate.  HEENT: Sclerae are anicteric. There is no epistaxis.  Cardiac: regular in rate and rhythm.    Respiratory: non-labored breathing. No use of accessory muscles. No wheeze.  Abdomen: bowel sounds are present. Abdomen is soft. No rebound.    Extremities: no edema. No cyanosis or clubbing.    Integument: no petechiae. No acute rash.         Laboratory:  Lab Results   Component Value Date    NA 137 10/18/2021    NA 139 09/20/2021    K 4.59 10/18/2021    K 4.5 09/20/2021    CL 101.6 10/18/2021    CL 104 09/20/2021    CO2 31.6 (H) 10/18/2021    CO2 24 09/20/2021    BUN 25.0 10/18/2021    BUN 22 09/20/2021    CREAT 1.6 (H) 10/18/2021    CREAT 1.86 (H) 09/20/2021    GLUCOSE 82 10/18/2021    GLUCOSE 97 09/20/2021    CALCIUM 9.7 10/18/2021    CALCIUM 9.6 09/20/2021     Lab Results   Component Value Date    CKTOT 50 09/01/2021    TROPONIN 0.05 (H) 12/19/2020    TROPONIN 0.07 (H) 12/19/2020    TROPONIN 0.06 (H) 12/18/2020     Lab Results   Component Value Date    BNP 156 (H) 06/03/2021     Lab Results   Component Value Date    WBC 4.7 10/18/2021    WBC 4.94 09/01/2021    HGB 12.7 10/18/2021    HGB 11.8 09/01/2021    HCT 38.2 10/18/2021    HCT 35.9 09/01/2021    MCV 103.0 (H) 10/18/2021    MCV 104.1 (H) 09/01/2021    PLT 379 (H) 10/18/2021    PLT 342 09/01/2021     Lab Results   Component Value Date    CHOL 237 09/01/2021    CHOLHDL 67 09/01/2021    CHOLDLQ 147 (H) 09/01/2021    TRIGLY 89 09/01/2021     Lab Results   Component Value Date    PT 12.9 12/18/2020    INR 1.0 12/18/2020     No components found for: ''HA1C''  Lab Results   Component Value Date    TSH 2.7 09/01/2021       Patient Active Problem List   Diagnosis   ? Acute medial meniscal tear, left, subsequent encounter   ? Angioedema   ? Anxiety   ? Asthmatic bronchitis, mild persistent, with acute exacerbation   ? Chronic cholecystitis   ? Chronic interstitial cystitis   ? CKD (chronic kidney disease), stage III (HCC/RAF)   ? COPD mixed type (HCC/RAF)   ? Major depression, recurrent, chronic (HCC/RAF)   ? Diabetes mellitus (HCC/RAF)   ? Esophageal reflux   ? Eustachian tube dysfunction   ? Fibromyalgia   ? H/O arthroscopy   ? Insomnia   ? Myasthenia gravis with exacerbation (HCC/RAF)   ? Nocturnal hypoxemia   ? Osteoarthritis of knee   ? Renal cyst   ? Seasonal and perennial allergic rhinitis   ? Spondylosis without myelopathy or radiculopathy, lumbar region   ? Urethral caruncle   ? Vaginal atrophy   ?  Benzodiazepine dependence (HCC/RAF)   ? Breast cancer (HCC/RAF)   ? Refractive error   ? Ptosis of eyelid   ? Pseudophakia of both eyes   ? Aortic calcification (HCC/RAF)   ? HTN (hypertension)   ? Immunosuppressed status (HCC/RAF)   ? Acute chest pain   ? Atypical chest pain       Outpatient Medications Prior to Visit   Medication Sig   ? Albuterol Sulfate AEPB Takes 1 to 2 puffs every 4 hours as needed for wheezing or SOB Inhale Takes 1 to 2 puffs every 4 hours as needed for wheezing or SOB ..   ? anastrozole 1 mg tablet Take 1 tablet (1 mg total) by mouth daily.   ? aspirin 81 mg EC tablet Take 1 tablet (81 mg total) by mouth daily.   ? azaTHIOprine 50 mg tablet Take one and one-half tablet (75 mg total) by mouth two (2) times daily.   ? clonazePAM 1 mg tablet 1 mg in pm.   ? DEXILANT 60 MG DR capsule TAKE 1 CAPSULE (60 MG TOTAL) BY MOUTH DAILY   ? DULoxetine 20 mg DR capsule Take 3 capsules (60 mg total) by mouth daily.   ? Evolocumab (REPATHA SURECLICK) 140 MG/ML SOAJ Inject 1 pen. under the skin every fourteen (14) days.   ? fluticasone-vilanterol (BREO ELLIPTA) 100-25 mcg/inh inhaler Inhale 1 puff daily.   ? GAMUNEX-C 20 GM/200ML infusion    ? losartan 50 mg tablet Take 1 tablet (50 mg total) by mouth daily.   ? meclizine 25 mg tablet Take 1 tablet (25 mg total) by mouth three (3) times daily as needed for Dizziness.   ? methenamine hippurate 1 g tablet Take 1 tablet (1 g total) by mouth two (2) times daily.   ? montelukast 10 mg tablet Take 1 tablet (10 mg total) by mouth as needed for.   ? PYRIDOSTIGMINE 60 mg tablet TAKE 1 TABLET (60 MG TOTAL) BY MOUTH THREE (3) TIMES DAILY AS NEEDED   ? TRIMETHOPRIM 100 mg tablet TAKE 1 TABLET (100 MG TOTAL) BY MOUTH DAILY   ? WELLBUTRIN SR 200 MG 12 hr tablet TAKE 1 TABLET (200 MG TOTAL) BY MOUTH TWO (2) TIMES DAILY     No facility-administered medications prior to visit.       Current Outpatient Medications   Medication Sig   ? Albuterol Sulfate AEPB Takes 1 to 2 puffs every 4 hours as needed for wheezing or SOB Inhale Takes 1 to 2 puffs every 4 hours as needed for wheezing or SOB ..   ? anastrozole 1 mg tablet Take 1 tablet (1 mg total) by mouth daily.   ? aspirin 81 mg EC tablet Take 1 tablet (81 mg total) by mouth daily.   ? azaTHIOprine 50 mg tablet Take one and one-half tablet (75 mg total) by mouth two (2) times daily.   ? clonazePAM 1 mg tablet 1 mg in pm.   ? DEXILANT 60 MG DR capsule TAKE 1 CAPSULE (60 MG TOTAL) BY MOUTH DAILY   ? DULoxetine 20 mg DR capsule Take 3 capsules (60 mg total) by mouth daily.   ? Evolocumab (REPATHA SURECLICK) 140 MG/ML SOAJ Inject 1 pen. under the skin every fourteen (14) days.   ? fluticasone-vilanterol (BREO ELLIPTA) 100-25 mcg/inh inhaler Inhale 1 puff daily.   ? GAMUNEX-C 20 GM/200ML infusion    ? losartan 50 mg tablet Take 1 tablet (50 mg total) by mouth daily.   ? meclizine  25 mg tablet Take 1 tablet (25 mg total) by mouth three (3) times daily as needed for Dizziness.   ? methenamine hippurate 1 g tablet Take 1 tablet (1 g total) by mouth two (2) times daily.   ? montelukast 10 mg tablet Take 1 tablet (10 mg total) by mouth as needed for.   ? PYRIDOSTIGMINE 60 mg tablet TAKE 1 TABLET (60 MG TOTAL) BY MOUTH THREE (3) TIMES DAILY AS NEEDED   ? TRIMETHOPRIM 100 mg tablet TAKE 1 TABLET (100 MG TOTAL) BY MOUTH DAILY   ? WELLBUTRIN SR 200 MG 12 hr tablet TAKE 1 TABLET (200 MG TOTAL) BY MOUTH TWO (2) TIMES DAILY     No current facility-administered medications for this visit.             2018 ACC/AHA guidelines recommends high-intensity statin because of ASCVD diagnosis. Patient is intolerant to statin. Consider non-statin Rx or referral.  10-Year ASCVD risk is N/A (Patient has ASCVD). as of 2:03 PM on 11/08/2021.  10-Year ASCVD risk with optimal risk factors cannot be calculated.  Values used to calculate ASCVD score:  Age: 86 y.o. Cannot calculate risk because age is not between 59 and 71 years old.  Gender: Female Race: White: Not Listed  67 mg/dL. (measured on 09/01/2021)  237 mg/dL. (measured on 09/01/2021)  147 mg/dL. (measured on 09/01/2021)  158 mm Hg. (measured on 11/08/2021)  Yes  currently not a smoker  Yes  Click here for the [facility] ASCVD Cardiovascular Risk Estimator Plus tool (online calculator).      Assessment:     [name] is 86 y.o. female with    Cardiomyopathy, EF 35-40%. Echo in 2023    - avoiding BBlockers due to myasthenia gravis    Abnormal Myoview in 2023: There is a moderate-severe perfusion defect in the distal anteroseptal wall and apex, suggestive for MI.  SOB/dyspnea. Uses oxygen concentrator  Abnormal EKG with LBBB and PACs  Former tobacco use  Subtle calcifications of the aortic root. Moderately calcified thoracic aorta and its main branch vessels.  Intolerant to Statins    Plan:     Losartan 50 mg  ASA    Started Repatha      York Ram., MD  Clelia Croft, MD  [name].   She and I had a thoughtful discussion today. ?In light of her breast cancer and her Myasthenia Gravis, it seems like medical management of her CAD is best.         Orders Placed This Encounter   ? Lipid Panel   ? Chol,LDL,Quant   ? ALT (SGPT)   ? Potassium   ? eGFR by Creatinine   ? CK, Total   ? ECG 12-Lead Clinic Performed           Return in about 2 months (around 01/08/2022).      There are no Patient Instructions on file for this visit.    Future Appointments   Date Time Provider Department Center   11/13/2021 12:20 PM [facility] CT01 CTNOHO [place]   11/14/2021  1:00 PM Kennedy Bucker., MD SM RADONC [facility]   11/14/2021  1:40 PM Fredda Hammed., MD HEM/ONC [facility]   11/22/2021 11:20 AM York Ram., MD GERI IMS [address] [place]/Cen   11/23/2021  1:00 PM [facility] MR01 3T MRINOHO [facility]   11/23/2021  1:45 PM NO[facility] MR01 3T MRINOHO [place]   01/26/2022  2:30 PM MP2 MAM PANEL01 (HOSP)  MAM MP2 [place]/C[facility]   02/13/2022 11:30 AM NEU MG CLINIC [facility] NEUMUSC B200 [place]/Cen         Author: Clelia Croft, MD

## 2021-11-12 NOTE — Consults
BREAST NEW PATIENT OFFICE VISIT NOTE    ?PATIENT: [name]  ?MRN: [id number]  ?DOB: 1935-07-21    ?DATE OF SERVICE: 11/14/2021    ?REFERRING PRACTITIONER: Fredda Hammed., MD  ?PRIMARY CARE PROVIDER: York Ram., MD  ?RESIDENT PHYSICIAN:   ?ATTENDING PHYSICIAN: Kennedy Bucker, MD     ?CHIEF COMPLAINT: referred for a discussion of radiation therapy recommendations for newly diagnosed breast cancer    ?IDENTIFYING DATA:  [name] is an 86yo female with myasthenia gravis who has bx proven bilat cT1N0 IDC with lobular features.  Right breast 12:00 6cFN Grade 2, ER+, PR+, Her2-, Ki67 5%. Left breast 11:00 5cFN ER+, PR-, Her2-. Ki67 10%.  She has declined surgery and has been on AI since June 2022 with decreasing findings on serial MRIs, most recently 05/03/21.  Of note she has additional findings in the breasts bilaterally that have not been biopsied but appear to be responding to AI therapy.    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  She declined surgery and received tam for approx 4-6 weeks then switched to AI briefly.  Both D/C'd due to side effects.    She presented with left breast pain and a palpable mass in April 2022 having received no additional therapy in the interim.    06/13/20 bilat dx mm and [place]:  MAMMOGRAM FINDINGS:     The breast tissue is heterogeneously dense, which may obscure detection of small masses (density C).     There are benign appearing calcifications seen in both breasts.     Right breast:  There is a mass with spiculated margins and associated coil biopsy  marker clip seen in the upper outer quadrant of the right breast at anterior depth.     There is a focal asymmetry with associated X biopsy marker clip seen in the right breast upper inner  quadrant at posterior depth.     There is an adjacent area of architectural distortion in the right breast upper inner quadrant at  posterior depth, slightly more posterior than the X microclip.     There are grouped calcifications measuring 6 mm seen in the upper outer  quadrant of the right breast at posterior depth located 5 centimeters from the nipple.     Left breast:  There is a mass measuring 10 mm with spiculated margins seen in the  left breast upper inner quadrant. Mass correlates to the palpable abnormality in the left breast.     ULTRASOUND FINDINGS:  Targeted breast and axillary sonography was performed in both  breasts.           Right breast:  Ultrasound demonstrates a solid mass with indistinct margins  measuring 14 x 4 x 10 mm seen in the right breast at 1 o'clock located 6 centimeters from the  nipple.  There is associated microclip. This correlates with the mammographic focal asymmetry the X  clip.     Ultrasound demonstrates a not parallel solid mass with irregular  margins measuring 8 x 4 x 5 mm seen in the right breast at 12 o'clock, upper outer quadrant  located  6 centimeters from the nipple. This may correspond with mammographic architectural distortion.     There is an oval mass measuring 4 x 3 x 2 mm seen in the right  breast at 12 o'clock, upper outer quadrant located 6 centimeters from the nipple.     There is a solid mass with indistinct margins measuring 5 x 3 x 2 mm  seen in the right breast at 2 o'clock, upper inner quadrant located 4 centimeters from the nipple.     There is no axillary lymphadenopathy on the right.     Left breast:  Ultrasound demonstrates an irregular mass with angular margins  measuring 10 x 9 x 9 mm seen in the left breast at 11 o'clock located 5 centimeters from the nipple.  This corresponds to the mammographic and palpable finding.     There is a vague hypoechoic area measuring 8 x 5 x 6 mm seen in the  left breast at 12 o'clock, upper outer quadrant located 3 centimeters from the nipple.     There is a solid mass measuring 6 x 3 x 5 mm  seen in the left breast at 1 o'clock located 1 centimeter from the nipple.     There is an axillary lymph node in the left axilla with cortical  thickness measuring up to 6 mm.     IMPRESSION:  Right breast:  ?  1. Mammographic architectural distortion/sonographic mass at 12:00 6 cm from nipple are suspicious.  Recommend ultrasound-guided biopsy.     2. Two biopsied areas in the right breast are known DCIS and ADH. Appropriate action should be taken.     3. Calcifications in the upper outer quadrant of the right breast at  posterior depth located 5 centimeters from the nipple are suspicious. Await results from biopsy at  site of distortion/12:00.     4. Additional solid masses in the right breast at 12:00 and 2 o'clock are probably benign.  Sonographic follow-up in 6 months is recommended tentatively.     Left breast:  1. Irregular mass in the left breast at 11 o'clock is highly suggestive  of malignancy. An ultrasound guided biopsy is recommended.     2. Vague hypoechoic area in the left breast at 12 o'clock, upper outer  quadrant is probably benign. Sonographic follow-up in 6 months is recommended.     3. Solid mass in the left breast at 1 o'clock is probably benign.  Sonographic follow-up in 6 months is recommended.     4. There is a lymph node in the left axilla with cortical  thickness measuring up to 6 mm. Surgical consultation is recommended.     The patient was notified about the results by Dr. Garlon Hatchet before leaving the facility, in the  presence of a chaperone. Scheduling and informational  regarding the two sites of biopsy were given to the patient.Additionally, a CC telephone message  regarding the result was sent to Dr. Brayton El at the end of the exam.     The patient has a history of breast cancer. The Tyrer-Cuzick risk model does not apply.     BI-RADS Category 5:  Highly Suggestive for Malignancy         07/13/20 bilat [place] bx:  FINAL DIAGNOSIS    ?  A. BREAST, RIGHT, MASS, 12:00, 6 CM FROM NIPPLE (NEEDLE CORE BIOPSY):  - Invasive ductal carcinoma with lobular and focal cribriform features, grade 2 (50% of biopsy; longest segment 4 mm)  Modified Bloom and Richardson score: 6 of 9                        Tubule formation:                3                        Nuclear pleomorphism:       2                        Mitotic score:                       1  - Ductal carcinoma in situ (DCIS), low nuclear grade, cribriform type with focal central necrosis  - Breast biomarkers: See below  - HER2 FISH:  Separate report to follow  ?  B.  BREAST, LEFT, MASS, 11:00, 5 CM FROM NIPPLE (NEEDLE CORE BIOPSY):  - Invasive ductal carcinoma with lobular features, grade 2 (50% of biopsy; longest involved segment 6 mm)             Modified Bloom and Richardson score: 6 of 9                        Tubule formation:                3                        Nuclear pleomorphism:       2                        Mitotic score:                       1  - Ductal carcinoma in situ (DCIS) is not identified  - Breast biomarkers: See immunohistochemistry report below  - HER2 FISH: Pending, will be reported separately  ?  COMMENT: The diagnosis was communicated to Dr. Babs Sciara and Dr. Glennis Brink via [facility] message on 07/14/2020 at 11:40 AM.   MICROSCOPIC DESCRIPTION    A microscopic examination has been performed.   IHC REPORT    ?  BLOCK:           A1          FIXATIVE:       Formalin  ?  ANTIBODY/PROBE:   RESULT/COMMENT  P63                              Positive in myoepithelial cells  [facility]                      Positive in myoepithelial cells  Ecadherin                    Positive  ?  BLOCK:           B1          FIXATIVE:       Formalin  ?  ANTIBODY/PROBE:   RESULT/COMMENT  Ecadherin                    Positive  ?  INTERPRETATION:  See final diagnosis.  ?  Note: The immunoperoxidase stain reported above was developed and its performance characteristics determined by [facility].? It has not been cleared or approved by the U.S. Food and Drug Administration, although such approval is not required for analyte-specific reagents of this type.? Appropriate controls are included for each case.  ?  ?  BREAST BIOMARKER REPORT  ?  BLOCK:  A1  FIXATIVE:  Formalin   ?  RESULT ER/PR:  ? ESTROGEN   RECEPTOR PROGESTERONE RECEPTOR   Antibody Clone SP1 Clone 636   %Tumor Staining >95% >95%   Intensity (1+ to 3+) 3+ 3+   Leica Bond III with Refine Polymer Detection System, using heat retrieval for 20 minutes with pH6 buffer. Clone SP1 diluted to 1/50 and PR636 to 1/200.  ?  ESTROGEN/PROGESTERONE IMMUNOHISTOCHEMICAL REPORT  Using appropriate positive and negative controls, the test for the presence of these hormone receptor proteins is performed by the immunoperoxidase method, and reported according to the 2009 CAP-ASCO Guidelines for Hormone Receptor testing. Tissue is fixed from 6-72 hours in 10% neutral buffered formalin. A positive ER or PR tumor shows greater than or equal to 1 percent of cells staining, and results are semi-quantitated as indicated above.  ?  ?  RESULT HER-2/neu IHC assay (utilizing FDA-approved DAKO Hercep Test): Arch Pathol Lab Med [phone], ASCO/CAP HER2 Testing in Breast Cancer Update - Veronia Beets al  ?  Test Score: 1+  ?  HER2/neu:  Negative  ?  0 No staining is observed OR membrane staining that is incomplete and is faint/barely perceptible and in ? 10% of tumor cells Negative   1+ Incomplete membrane staining that is faint/barely perceptible and in   > 10% of tumor cells Negative  ?   2+ Weak to moderate complete membrane staining observed in > 10% of tumor cells  Equivocal   3+ Circumferential membrane staining that is complete, intense, and in   > 10% of tumor cells Positive   ?  Note:  FISH gene amplification testing is pending. Please see separate report.  ?  ?  RESULT Ki-67 (Clone MIB1):  5%  ?  Note: The immunoperoxidase stains reported above for ER, PR, and Ki-67 were developed and their performance characteristics determined by [facility] Pathology [facility], [facility], [place]. They have not been cleared or approved by the U.S. Food and Drug Administration, although such approval is not required for analyte-specific reagents of this type.  Decalcification may adversely affect patient results.? The HER2/neu, ER and PR assays have not been validated on decalcified tissues. ?If the tissue is indicated to have been decalcified, results should be interpreted with caution given the possibility of false negative results on decalcified specimens.  ?  ?  BREAST BIOMARKER REPORT  ?  BLOCK:  B1  FIXATIVE:  Formalin   ?  RESULT ER/PR:  ? ESTROGEN   RECEPTOR PROGESTERONE RECEPTOR   Antibody Clone SP1 Clone 636   %Tumor Staining >95% 0%   Intensity (1+ to 3+) 3+ NA   Leica Bond III with Refine Polymer Detection System, using heat retrieval for 20 minutes with pH6 buffer. Clone SP1 diluted to 1/50 and PR636 to 1/200.  ?  ESTROGEN/PROGESTERONE IMMUNOHISTOCHEMICAL REPORT  Using appropriate positive and negative controls, the test for the presence of these hormone receptor proteins is performed by the immunoperoxidase method, and reported according to the 2009 CAP-ASCO Guidelines for Hormone Receptor testing. Tissue is fixed from 6-72 hours in 10% neutral buffered  formalin. A positive ER or PR tumor shows greater than or equal to 1 percent of cells staining, and results are semi-quantitated as indicated above.  ?  ?  RESULT HER-2/neu IHC assay (utilizing FDA-approved DAKO Hercep Test): Arch Pathol Lab Med [phone], ASCO/CAP HER2 Testing in Breast Cancer Update - Veronia Beets al  ?  Test Score: 2+  ?  HER2/neu:  Equivocal  ?  0 No staining is observed OR membrane staining that is incomplete and is faint/barely perceptible and in ? 10% of tumor cells Negative   1+ Incomplete membrane staining that is faint/barely perceptible and in   > 10% of tumor cells Negative  ?   2+ Weak to moderate complete membrane staining observed in > 10% of tumor cells  Equivocal   3+ Circumferential membrane staining that is complete, intense, and in   > 10% of tumor cells Positive   ?  Note:  FISH gene amplification testing is pending. Please see separate report.  ?  ?  RESULT Ki-67 (Clone MIB1):  10%     FISH REPORT    ERBB2 (HER2) FISH: Negative for HER2 gene amplification   ?  HER2/Centromere ratio: 1.00    Average HER2 copy number per cell:  2.05   ? ?   Centromere (green signals): 41   HER2 (red signals): 41   ?     FISH REPORT    ERBB2 (HER2) FISH: Negative [based on IHC (2+) and FISH, see comment].   ?  HER2/Centromere ratio: 1.34    Average HER2 copy number per cell:  4.15   ? ?   Centromere (green signals): 124   HER2 (red signals): 166            Genetic testing:    08/05/20 breast MRI:  FINDINGS:   ?  Right breast:   *  The breast is composed of heterogeneous fibroglandular tissue. Moderate background parenchymal enhancement is present.   *  Linear nonmass enhancement measuring  13 (AP) x 6 (ML) x 6 (SI) mm, lies at 11 o'clock upper inner quadrant, 5 cm deep to the nipple (84/176). Kinetic curve analysis shows predominantly fast initial uptake with plateau on delayed imaging. This   correlates to the architectural distortion seen mammographically. There is susceptibility artifact (corresponding to the X-shaped clip) from a biopsy performed at an outside hospital located approximately 13 mm anterior to this nonmass enhancement. This   represents known malignancy, which likely represents the same process as the distortion seen mammographically.  *  Irregular mass containing susceptibility artifact (corresponding to the ribbon microclip) measuring 6 (AP) x 8 (ML) x 10 (SI) mm, lies at 12 o'clock upper outer quadrant, 6 cm deep to the nipple (89/176) represents known malignancy. Kinetic curve   analysis shows predominantly fast initial uptake with washout on delayed imaging.   *  Focal nonmass enhancement containing susceptibility artifact (corresponding to the coil microclip) from a biopsy performed in an outside facility measuring  16 (AP) x 9 (ML) x 14 (SI) mm, lies at 9 o'clock upper inner quadrant, 2 cm deep to the nipple   (114/176) represents known malignancy. Kinetic curve analysis shows predominantly fast initial uptake with persistent enhancement on delayed imaging.  *  There is no axillary lymphadenopathy.   ?  Left breast:    The breast is composed of heterogeneous fibroglandular tissue. Moderate background parenchymal enhancement is present.   *  Irregular mass containing a microclip measuring  12 (AP) x 14 (  ML) x 15 (SI) mm, lies at 11 o'clock upper inner quadrant, 5 cm deep to the nipple (80/176) is a known malignancy. Kinetic curve analysis shows predominantly fast initial uptake with   washout on delayed imaging.   *  Nonmass enhancement measuring 43 (AP) x 24 (ML) x 40 (SI) mm, extending throughout the upper inner, upper outer, and lower outer quadrants (images 94 through 128/176). Kinetic curve analysis shows predominantly fast initial uptake with persistent   enhancement on delayed imaging.  *  Oval enhancing mass measuring 5 mm with suggestion of a fatty hilum lies at 4 o'clock lower outer, 7 cm deep to the nipple (120/176).  *  There is no axillary lymphadenopathy. No MRI correlate for prominent axillary lymph node seen on prior ultrasound.  ?  Additional findings:    None.   ?  ?  IMPRESSION:   ?  1. Right breast:   a) Linear nonmass enhancement measuring 13 mm at 11:00, 5 cm deep to the nipple corresponds to the suspicious architectural distortion seen mammographically. This likely represents the same process as the known malignancy marked by the X microclip from   outside biopsy, which lies 13 mm anterior to this enhancement. Surgical and oncologic management is recommended. BI-RADS CATEGORY 6.  b) Irregular mass containing a microclip measuring 10 mm at 12:00, 6 cm deep to the nipple is a known malignancy. Surgical and oncologic management is recommended. BI-RADS CATEGORY 6.  c) Focal nonmass enhancement containing a microclip from a biopsy performed outside facility measuring 16 mm at 9:00, 2 cm deep to the nipple is a known malignancy. Surgical and oncologic management is recommended. BI-RADS CATEGORY 6.  ?  2. Left breast:   a) Irregular mass containing a microclip measuring 15 mm at 11:00, 5 cm deep to the nipple is a known malignancy. Surgical and oncologic management is recommended. BI-RADS CATEGORY 6.  b) Nonmass enhancement measuring 43 mm throughout the majority of the left breast is suspicious. If breast conservation is a consideration recommend MRI guided biopsy of the anterior-inferior aspect of the nonmass enhancement. BI-RADS CATEGORY 4  c) Oval enhancing mass measuring 5 mm with suggestion of a fatty hilum lies at 4:00, 7 cm deep to the nipple is probably benign and likely represents an intramammary lymph node. Recommend second look ultrasound if there is no sonographic correlate then   short-term MRI follow-up in 6 months is recommended. BI-RADS CATEGORY 3.  d) No MRI correlate for prominent axillary lymph node seen on prior ultrasound. Management as detailed on prior ultrasound report is recommended. BI-RADS CATEGORY 2.  ?  OVERALL ASSESSMENT: BI-RADS CATEGORY 4   08/18/20 PET-CT:  ?  ONCOLOGIC FINDINGS:  ?  History of bilateral breast cancer with:   ?  - Focal FDG activity in the left greater than right breasts associated with breast biopsy markers.       --The left focus is associated with an area of nodular soft tissue density measuring 9 x 10 mm with moderate FDG activity, SUV max 3.4 (4-131).       --The right is associated with a smaller focus of nodular soft tissue density measuring approximately 5 mm with mild FDG activity, SUV max 1.8 (4-131).  ?  ?  ADDITIONAL FINDINGS:  ?  PET:  Physiologic FDG uptake is noted in the visualized portions of the brain, salivary gland tissues, myocardium, the abdominal solid organs, gastrointestinal tract, both kidneys, ureters and the urinary bladder.  ?  For SUV  reference:  SUVmean/max liver 2.3/2.7 (4-190)  SUVmean/max mediastinal blood pool (measured at the descending thoracic aorta) 1.6/1.7 (4-171)  ?  ?  CT: Lack of intravenous contrast compromises evaluation of perfusion and for isodense lesions.  ?  BASE OF HEAD AND NECK:    Unremarkable.  ?  CHEST:  Lungs: Mild bronchial wall thickening. Mild centrilobular emphysema.  Lymph nodes and Mediastinum: Subcentimeter pretracheal lymph node with mild FDG activity measures 6 mm in short axis, (4-128), likely reactive given normal morphology,. Additional subcentimeter mediastinal lymph nodes which do not meet pathologic size criteria and are without significant FDG activity.  Pleura: No pleural effusion. No pneumothorax.  Cardiovascular: Normal heart size with no pericardial effusion. Mitral valve annulus calcifications. Subtle calcifications of the aortic root. Moderately calcified thoracic aorta and its main branch vessels.  ?  ABDOMEN/PELVIS:   Liver: Normal density and contour. No solid mass.  Gallbladder and bile ducts: Surgically absent gallbladder. No biliary dilatation.  Spleen, pancreas, adrenals: Mildly atrophic pancreas.  Kidneys and ureters: Mild cortical atrophy. Normal kidney size. No hydronephrosis. Bilateral simple appearing renal cysts.  Bowel: Thickening in the region of the gastroesophageal junction likely postsurgical changes related to Nissen fundoplication. Nondilated bowel with no wall thickening. Predominantly left sided colonic diverticulosis without associated colonic wall thickening or surrounding stranding.  Bladder and reproductive organs: Unremarkable. No adnexal mass.  Lymph nodes: No enlarged or FDG-avid lymph nodes.  Peritoneum: Unremarkable.  Vessels: Mild atherosclerosis. No aneurysmal dilatation.  Abdominal wall: Nonspecific punctate calcification of the gluteal soft tissues..  ?  MUSCULOSKELETAL:  Demineralized bones with multilevel degenerative changes of the spine.  ?  ?  IMPRESSION:   ?  History of bilateral breast cancer with:  ?  1.  Mild to moderate left greater than right FDG activity associated with small soft tissue densities in the bilateral breasts in the regions marked by breast biopsy clips.   ?  2.  No evidence of FDG avid metastatic disease.    On letrozole 08/17/20-10/28/20, switched to arimidex 10/28/20.  Med onc is Dr Oscar La.    11/18/20 breast MRI:  FINDINGS:   ?  Image quality is moderately degraded by motion artifact, which limits sensitivity of the examination.  ?  Right breast:   *  The breast is composed of heterogeneous fibroglandular tissue. Minimal background parenchymal enhancement is present.   *  Linear nonmass enhancement measuring  13 (AP) x 4 (ML) x 6 (SI) mm previously 13 x 6 x 6 mm, lies at 11 o'clock upper inner quadrant, 5 cm deep to the nipple (98/216). Kinetic curve analysis shows predominantly medium initial uptake with persistent   enhancement on delayed imaging. There is susceptibility artifact (corresponding to the X-shaped clip) located approximately 13 mm anterior to this nonmass enhancement. This represents known malignancy.  *  Irregular mass containing susceptibility artifact (corresponding to the ribbon microclip) measuring 6 (AP) x 3 (ML) x 8 (SI) mm, previously 6 x 8 x 10mm, lies at 12 o'clock upper outer quadrant, 6 cm deep to the nipple (97/216) represents known   malignancy. Kinetic curve analysis shows predominantly fast initial uptake with washout on delayed imaging.   *  Focal nonmass enhancement containing susceptibility artifact (corresponding to the coil microclip) measuring 16 (AP) x 6 (ML) x 8 (SI) mm, previously 16 x 9 x 14 mm, lies at 9 o'clock upper inner quadrant, 2 cm deep to the nipple (133/216) with   subthreshold kinetics. This represents known malignancy.   *  No suspicious lymph nodes are present.     Left breast:    *  The breast is composed of heterogeneous fibroglandular tissue. Minimal background parenchymal enhancement is present.   *  Irregular mass containing a microclip measuring  13 (AP) x 11 (ML) x 12 (SI) mm, previously 12 x 14 x 15 mm, lies at 11 o'clock upper inner quadrant, 5 cm deep to the nipple (95/216) is a known malignancy. Kinetic curve analysis shows predominantly   fast initial uptake with washout on delayed imaging.   *  Nonmass enhancement measuring 32 (AP) x 19 (ML) x 40 (SI) mm, previously 43 x 24 x 40 mm, extending throughout the upper inner, upper outer, and lower outer quadrants (images 99 through 122/216) now demonstrates subthreshold kinetics.   *  Oval enhancing mass at 4 o'clock 7 cm from the nipple is no longer seen.   *  No suspicious lymph nodes are present.  ?  Additional findings: Note that this exam is not tailored for evaluation of structures outside of the breasts.  *  None.   ?  ?  IMPRESSION:   ?  1. Right breast:   *  Stable to slightly decreased size of known biopsy proven malignancies in the right breast. BI-RADS CATEGORY 6 - KNOWN BIOPSY-PROVEN MALIGNANCY. Recommend continued surgical and oncologic management.   ?  2. Left breast:  *  Decreased size of the irregular mass with microclip at 11:00, 5 cm from the nipple, consistent with known malignancy. BI-RADS CATEGORY 6 - KNOWN BIOPSY-PROVEN MALIGNANCY. Recommend continued surgical and oncologic management.   *  Nonmass enhancement spanning 40 mm has also slightly decreased in size and now demonstrates subthreshold kinetics. This remains suspicious. If breast conservation is desired, an MRI guided biopsy is recommended. BI-RADS CATEGORY 4 - SUSPICIOUS.  *  Previously described oval enhancing mass at 4:00 is no longer identified. BI-RADS CATEGORY 2.  ?  OVERALL ASSESSMENT: BI-RADS CATEGORY 4 - SUSPICIOUS.    12/18/20 CT C/A/P:  ONCOLOGIC FINDINGS:  ?  History of bilateral breast cancer with:  ?  - Bilateral breast postbiopsy clips in areas of known biopsy-proven malignancy.  ?  ADDITIONAL FINDINGS:  ?  Pulmonary circulation: Although not timed for the evaluation of pulmonary emboli. No pulmonary emboli. The pulmonary artery is normal in caliber measuring up to 2.3 cm.  ?  Aorta: No thoracic aortic intramural hematoma, dissection, pseudoaneurysm, or ulcerated plaque. Calcified and noncalcified atherosclerotic plaque. Retropharyngeal course of the right common carotid artery.  ?  Lungs: Patent tracheobronchial tree. Diffuse bronchial and bronchiolar wall thickening. Upper lobe predominant moderate centrilobular emphysema. Left apex calcified granuloma (5-74). Stable subcentimeter pulmonary nodules, for example the right lower   lobe (5-197). No suspicious solid pulmonary nodules. Multifocal bibasilar atelectasis/scarring.  ?  Lymph nodes: No mediastinal, hilar, or axillary lymphadenopathy by size criteria.   ?  Pleura: No pleural effusions. No pneumothorax.  ?  Cardiovascular: Normal heart size. No pericardial effusion. Mild mitral valve annulus calcification.  ?  Osseous: No suspicious osseous lesions. Degenerative changes of the spine.  ?  Other: Heterogeneous thyroid gland containing multiple nodules with the largest measuring up to 7 mm in the right thyroid lobe (5-22).  ?  Upper abdomen: Moderate hiatal hernia. Status post cholecystectomy. Diffusely atrophic pancreas. Subcentimeter hyperenhancing focus in the spleen (5-239) is nonspecific but likely related to heterogeneous opacification of the spleen on arterial phase.  ?  ?  IMPRESSION:  ?  1.  No  acute aortic process. No acute process within the chest.  2.  History of bilateral breast cancer without evidence of intrathoracic metastatic disease.  3.  Additional findings as above.  ?FINDINGS:  ?  ONCOLOGIC FINDINGS:  ?  History of bilateral breast cancer with:   - No metastatic disease in the abdomen or pelvis.  ?  ADDITIONAL FINDINGS:  Lack of intravenous contrast compromises evaluation of perfusion and for isodense lesions.  Lower chest: Please see separate chest report. Eventration of the right hemidiaphragm.  Liver: Unremarkable.  Gallbladder and bile ducts: Surgically absent gallbladder. No biliary dilatation.  Spleen: Unremarkable.  Pancreas: Diffusely atrophic.  Adrenals: Unremarkable.  Kidneys and ureters: Residual contrast within the bilateral collecting systems limits evaluation for stones. Bilateral renal cysts, measuring up to 2.5 cm in the left lower pole (2-60).   Bowel: Postsurgical changes at the GE junction, likely related to prior reported Nissen fundoplication. Moderate hiatal hernia. Left-sided diverticulosis without evidence of diverticulitis. Normal appendix. No bowel wall thickening or obstruction.  Bladder: Distended with contrast.  Reproductive organs: Normal uterus. No adnexal masses.  Lymph nodes: No lymphadenopathy.  Peritoneum: No free air, free fluid, or fluid collections.  Vessels: Mild atherosclerosis.  Abdominal wall: Unremarkable.  Bones: Degenerative changes of the lumbar spine and bilateral hips..  ?  ?  IMPRESSION:  ?  1.  No acute CT abnormality in the abdomen or pelvis.  2.  Moderate hiatal hernia with postsurgical changes likely related to reported prior Nissen fundoplication.  3.  Left-sided diverticulosis without evidence diverticulitis.  4.  History of bilateral breast cancer without evidence of metastatic disease within the abdomen or pelvis.  ?  05/03/21 breast MRI:  ?  FINDINGS:   ?  ?  ?  Right breast:   *  The breast is composed of heterogeneous fibroglandular tissue. Moderate background parenchymal enhancement is present, which limits evaluation of the breast.   *  Trace residual enhancement at 11 o'clock upper inner quadrant, 5 cm deep to the nipple with susceptibility artifact located 13 mm anteriorly corresponding to the X-shaped clip corresponding to biopsy-proven malignancy (99/176).   *  Trace residual enhancement at 12 o'clock upper outer quadrant, 6 cm deep to the nipple with susceptibility artifact corresponding to the rib and microclip corresponding to biopsy-proven malignancy (103/176).  *  Trace residual enhancement at 9 o'clock upper inner quadrant, 2 cm deep to the nipple with susceptibility artifact corresponding to the coil microclip corresponding to biopsy-proven malignancy (134/176).  *  No suspicious lymph nodes are present.  ?  Left breast:    *  The breast is composed of heterogeneous fibroglandular tissue. Moderate background parenchymal enhancement is present, which limits evaluation of the breast.   *  Grossly stable irregular mass containing a microclip measuring 13 (AP) x 9 (ML) x 13 (SI) mm at 11 o'clock upper inner quadrant, 5 cm deep to the nipple corresponding to biopsy-proven malignancy. Kinetic curve analysis shows fast initial uptake with   washout on delayed imaging.   *  Interval resolution of previously described regional nonmass enhancement in the upper inner, upper outer, and lower outer quadrants.  *  No suspicious lymph nodes are present.  ?  Additional findings: Note that this exam is not tailored for evaluation of structures outside of the breasts.  *  STIR hyperintense fluid collection in the right glenohumeral joint/subacromial region.   ?  ?  IMPRESSION:   ?  1. Right breast:   *  Interval  decrease in size of known malignancies in the right breast with trace residual enhancement. Recommend continued surgical and oncological management. BI-RADS CATEGORY 6 - KNOWN BIOPSY-PROVEN MALIGNANCY.   ?  2. Left breast:  *  Grossly stable irregular mass at 11 o'clock corresponding to biopsy-proven malignancy. Recommend continued surgical and oncological management. BI-RADS CATEGORY 6 - KNOWN BIOPSY-PROVEN MALIGNANCY. *  Interval resolution of previously described regional nonmass enhancement in the upper inner, upper outer, and lower outer quadrants. Recommend continued surgical management. BI-RADS CATEGORY 4 - SUSPICIOUS.   ?  3. Additional findings:  *  STIR hyperintense fluid collection in the right glenohumeral joint/subacromial region may represent bursitis. Correlate with symptoms/physical exam.  ?  OVERALL ASSESSMENT: BI-RADS CATEGORY 4 - SUSPICIOUS.    CT chest 11/13/21:   ?FINDINGS:  ?  ONCOLOGIC FINDINGS:   History of bilateral breast cancer  - No definite metastatic disease in the thorax  - Bilateral breast postbiopsy clips in areas of known biopsy-proven malignancy.     ADDITIONAL FINDINGS:   Lungs and Airways: No new or increasing pulmonary nodules. Unchanged scattered bilateral micronodules. No pneumonia or pulmonary edema. Patent central airways.  Pleura: No pleural effusion. No pneumothorax.  Cardiovascular: Normal heart size. No pericardial effusion. Mitral annular calcifications. Mild coronary artery calcifications consistent with coronary artery disease. There are calcifications of the thoracic aorta.  Lymph Nodes and Mediastinum: No mediastinal or hilar lymphadenopathy.  Bones and soft tissues: No suspicious osseous lesions. A 12 mm right thyroid nodules noted (3-14).  Upper Abdomen: Small hiatal hernia. Status post cholecystectomy. Nonspecific bilateral perinephric stranding. Atherosclerotic calcifications of the abdominal aorta.  ?  ?  IMPRESSION:  History of bilateral breast cancer without definite metastatic disease in the thorax    She has been on AI > 1 yr but is currently in an approx 2 week break due to GI effects which she attributes to AI.  She may be willing to trial re-starting.  She is seeing Dr Oscar La later today.  She asked Dr Oscar La if there might be an alternatives to AI and was referred for radiation consideration. She is scheduled for bilat dx mm and [place] 01/26/22.    In terms of the breasts currently she has intermittent bilat tenderness which has been her longstanding baseline which has been attributed to cysts.  She describes having had cyst aspirations in the past.    PERTINENT PAST MEDICAL HISTORY:   Past Medical History:   Diagnosis Date   ? Breast cancer (HCC/RAF) 12/20/2018     History reviewed. No pertinent surgical history.    GYN HISTORY:   OB History   No obstetric history on file.        ALLERGIES:   Allergies as of 11/14/2021 - Review Complete 11/14/2021   Allergen Reaction Noted   ? Beta adrenergic blockers Other (See Comments) 07/03/2017   ? Levofloxacin Other (See Comments) 09/22/2019   ? Macrolides and ketolides Other (See Comments) 07/03/2017   ? Sulfa antibiotics Other (See Comments) 03/12/2013   ? Botulinum toxins Other (See Comments) 07/03/2017   ? Statins Other (See Comments) 12/19/2018   ? Plastic tape Itching and Other (See Comments) 11/27/2018     MEDICATIONS:   Current Outpatient Medications   Medication Sig   ? Albuterol Sulfate AEPB Takes 1 to 2 puffs every 4 hours as needed for wheezing or SOB Inhale Takes 1 to 2 puffs every 4 hours as needed for wheezing or SOB ..   ? aspirin 81 mg  EC tablet Take 1 tablet (81 mg total) by mouth daily.   ? azaTHIOprine 50 mg tablet Take one and one-half tablet (75 mg total) by mouth two (2) times daily.   ? clonazePAM 1 mg tablet 1 mg in pm.   ? DEXILANT 60 MG DR capsule TAKE 1 CAPSULE (60 MG TOTAL) BY MOUTH DAILY   ? DULoxetine 20 mg DR capsule Take 3 capsules (60 mg total) by mouth daily.   ? Evolocumab (REPATHA SURECLICK) 140 MG/ML SOAJ Inject 1 pen. under the skin every fourteen (14) days.   ? fluticasone-vilanterol (BREO ELLIPTA) 100-25 mcg/inh inhaler Inhale 1 puff daily.   ? GAMUNEX-C 20 GM/200ML infusion    ? losartan 50 mg tablet Take 1 tablet (50 mg total) by mouth daily.   ? meclizine 25 mg tablet Take 1 tablet (25 mg total) by mouth three (3) times daily as needed for Dizziness.   ? methenamine hippurate 1 g tablet Take 1 tablet (1 g total) by mouth two (2) times daily.   ? montelukast 10 mg tablet Take 1 tablet (10 mg total) by mouth as needed for.   ? PYRIDOSTIGMINE 60 mg tablet TAKE 1 TABLET (60 MG TOTAL) BY MOUTH THREE (3) TIMES DAILY AS NEEDED   ? TRIMETHOPRIM 100 mg tablet TAKE 1 TABLET (100 MG TOTAL) BY MOUTH DAILY   ? WELLBUTRIN SR 200 MG 12 hr tablet TAKE 1 TABLET (200 MG TOTAL) BY MOUTH TWO (2) TIMES DAILY   ? anastrozole 1 mg tablet Take 1 tablet (1 mg total) by mouth daily. (Patient not taking: Reported on 11/14/2021.)   ? exemestane 25 mg tablet Take 1 tablet (25 mg total) by mouth daily.     No current facility-administered medications for this encounter.       SOCIAL HX:   Social History     Socioeconomic History   ? Marital status: Widowed   Tobacco Use   ? Smoking status: Former     Types: Cigarettes     Quit date: 1985     Years since quitting: 38.7   ? Smokeless tobacco: Former   Substance and Sexual Activity   ? Alcohol use: Not Currently   ? Drug use: Never       FAMILY HX:  No family history on file.     PREVIOUS MALIGNANCY: see HPI  PREVIOUS RADIATION THERAPY: see HPI  PREVIOUS CHEMOTHERAPY: see HPI          Objective:      Physical Exam:   There were no vitals taken for this visit.  GENERAL:  The patient is a well-developed, well-nourished, female in no acute distress.  HEENT:  Normocephalic and atraumatic.  Anicteric sclerae.  Mucous membranes are moist.  HOH.NECK:  Nl ROM  BREASTS:  Deferred   LYMPHATICS:  deferred  CHEST:  Non labored breathing.   MUSCULOSKELETAL SYSTEM:  No upper extremity lymphedema. Shoulder abduction and external rotation adequate to assume radiation treatment position.    NEUROLOGIC EXAM:  Patient is alert and oriented.  Patient ambulates with a walker    Assessment:      [name] is an 86yo female with myasthenia gravis who has bx proven bilat cT1N0 IDC with lobular features.  Right breast 12:00 6cFN Grade 2, ER+, PR+, Her2-, Ki67 5%. Left breast 11:00 5cFN ER+, PR-, Her2-. Ki67 10%.  She has declined surgery and has been on AI since June 2022 with decreasing findings on serial MRIs, most recently 05/03/21.  Of  note she has additional findings in the breasts bilaterally that have not been biopsied but appear to be responding to AI therapy.  Plan/ Recommendation:   We reviewed the patient?s detailed clinical history, imaging, and pathology.         This is her most recent MRI from March 2023 which indicates an overall small bilateral dz burden ta present after > 1 yr on AI. We discussed that the 2 rationales for RT would be pain palliation or to alter the dz course and treat with more ablative curative intent that could prevent distant mets/local progression.  In terms of treating with palliative intent, I dont believe this small scattered dz burden to be the culprit for her described bilat breast pain and the tumor burden is small and not impacting day to day QOL.  Hence I dont see a justification to radiation for palliative intent.  She informed me today that she self D/C'd AI approx 2 weeks ago due to bothersome GI side effects.  We discussed that RT could be considered should she elect to discontinue AI altogether.  If she is willing to continue AI, I would favor holding RT until the point a which she either has documented progression on AI or declines to continue AI. Thus far imaging has indicated the cancers are responding to AI and she has no evidence of distant mets.  We discussed that we will stay on board and can re-evaluate anytime as updated imaging is obtained or if she does discontinue AI due to side effects.      I discussed the nature and rationale of radiation therapy for breast cancer. I explained that there can be both early and late side effects associated with the radiation treatment. Acutely, there is risk of redness, tanning, darkening and/or peeling of the skin in the radiation  field;  small chance of blistering  of  the  skin;  swelling  of  the  tissues  in  the radiation  field; tenderness,  soreness,  and/or  intermittent  aches  and  pains  in  the  treatment  area; itchiness of the skin.  On a long term basis, radiation to the breast can cause persistent tanning/darkening of the skin in the treatment area; sensitivity to touch in the treatment area; changes in the texture (feel), size, and/or shape of the treated breast or chest wall area; firmness of the breast or chest wall; discomfort and/or swelling of the breast tissue; lymphedema; rarely pneumonitis (inflammation of the lung) or scarring of the lung that is rarely associated with symptoms; rib fracture. There is a small chance that radiation therapy may increase the risk of developing another cancer which could occur in the opposite breast, other areas of the treated breast, or in the lung. Radiation to the left breast or chest may increase the risk of developing a heart problem (such as coronary artery disease, congestive heart failure, or heart attack).  We also discussed the risks, benefits, alternatives, likelihood of success, and possible results of non-treatment.    Plan:  -she is responding to current therapy and will thus await re-imaging (01/26/22 bilat dx mm and [place])  -she has a f/u with Dr Oscar La later today to discuss resuming AI which has been on hold approx 2 weeks  -could consider RT at some point of she is progressing and/or symptomatic or if she discontinues AI permanently due to side effects  -we discussed 5 fx RT regimen should we elect to proceed  w RT at some point in the future    Level of Service  High level of Medical Decision Making: (at least 2 of below)  [x]  High complexity of problems addressed.cancer of overlapping sites of bilateral female breasts. This poses a threat to life or bodily function.    [x]  High risk of complications/morbidity/mortality of treatment due to inherent nature of radiation therapy and intensive monitoring for radiation therapy toxicity, with or without chemotherapy  Total time: I spent 60 minutes on the day of service of face-to-face and non-face-to-face time, consisting of counseling and coordination of care and a detailed question and answer session, in addition to:  [x]  Face-to-face and non-face-to-face time spent with the patient  [x]  Preparing to see the patient (e.g. review of tests, imaging)  [x]  Obtaining and/or reviewing separately obtained history  [x]  Performing a medically appropriate exam and evaluation  [x]  Counseling and education with the patient and family/caregiver  []  Ordering medications, tests, procedures  [x]  Referring or communicating with other healthcare professionals  [x]  Documenting clinical information in the EHR  [x]  Independently interpreting results and communicating results to patient/family/caregiver      cc York Ram., MD  Fredda Hammed., MD    Author: Kennedy Bucker, MD November 14, 2021 4:35 PM

## 2021-11-13 ENCOUNTER — Inpatient Hospital Stay: Payer: BLUE CROSS/BLUE SHIELD | Attending: Hematology & Oncology

## 2021-11-13 DIAGNOSIS — C50812 Malignant neoplasm of overlapping sites of left female breast: Secondary | ICD-10-CM

## 2021-11-13 DIAGNOSIS — Z170 Estrogen receptor positive status [ER+]: Secondary | ICD-10-CM

## 2021-11-13 DIAGNOSIS — C50811 Malignant neoplasm of overlapping sites of right female breast: Secondary | ICD-10-CM

## 2021-11-13 NOTE — Patient Instructions
Thank you for visiting [facility].      If you have any questions or concerns in advance of the simulation, please call Radiation Oncology at [phone].

## 2021-11-13 NOTE — Progress Notes
HEMATOLOGY/ONCOLOGY OUTPATIENT NOTE    PATIENT: [name]  MRN: [id number]  DOB: [birth year]  DATE OF SERVICE: 11/14/2021  Referring MD: York Ram., MD    Reason for Consultation/ Chief Complaint:  Bilateral breast cancer    HPI   [name] is a 86 y.o. female with PMH including myasthenia gravis, asymmetric hearing loss, fibromyalgia, DM with CKD, major depression, interstitial cystitis, IBS, COPD who presents to establish care for breast cancer.     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 07/19/2017 Screening detected right breast calcifications and distortion in the upper outer quadrant: Biopsy DCIS+ ALH +CSL; 4 cm apart, axilla negative.    Neoadjuvant tamoxifen started 10/30/17, stopped after 4-6 weeks due to dry mouth and mouth sores. Switched to anastrozole 01/29/18, discontinued because it made her throat dry. Declined additional antiestrogen therapy.   Moved on October of 2020 to [place]   ?  [name] then noted left breast pain in April 2022.     Underwent a diagnostic bilateral breast imaging 06/13/2020 which revealed:   ?- Right breast: solid mass with indistinct margins measuring 14 x 4 x 10 mm in the right breast at 1 o'clock located 6 centimeters from the nipple with associated microclip,  not parallel solid mass with irregular margins measuring 8 x 4 x 5 mm seen in the right breast at 12 o'clock, upper outer quadrant located 6 centimeters from the nipple.   ?- Left breast: irregular mass with angular margins measuring 10 x 9 x 9 mm seen in the left breast at 11 o'clock located 5 centimeters from the nipple, axillary lymph node in the left axilla with cortical thickness measuring up to 6 mm.    R breast biopsy at 12:00, 6 cm from nipple on 07/13/2020 revealed invasive ductal carcinoma with lobular and focal cribriform features, grade 2 ER (+) PR (+) HER2 (-), Ki-67 5%    L breast biopsy at 11:00, 5 cm from nipple on 07/13/2020 revealed invasive ductal carcinoma with lobular features, grade 2 ER (+) PR (-) HER2 (-), Ki-67 10%  ?  Breast MRI performed on 08/05/2020 that redemonstrated multifocal right breast malignancy and left breast IDC with an additional 4.3cm of NME that spanned nearly the entire left breast that appears suspicious.     [facility] genetic testing (-) for pathogenic variants.     08/17/2020 Patient presents to establish care for breast cancer. She has seen Dr. Janee Morn to discuss surgical treatment, and is quite worried about anesthesia with her myasthenia gravis . She is interested in neoadjuvant therapy     09/13/2020 Patient presents for follow-up of breast cancer. PETCT on 08/22/2020 with no evidence of FDG avid metastatic disease. DXA scan on 08/26/2020 demonstrated osteopenia of the left femoral neck and total hip.     10/28/2020 Patient presents for follow-up of breast cancer. Labs on 09/13/2020 showed WBC 5.3, hgb 11.4, platelets 300, ANC 3.58. Endorses fatigue, frequent diarrhea. BM anytime she eats, usually about 3 times a day. Diarrhea is more frequent in the morning. Also reports hot flashes. Breasts are tender, using a larger bra size.     11/25/2020 Patient presents for follow-up of breast cancer. Labs on 9/2/202 showed WBC 6.7, hgb 12.0, platelets 384,  ANC 4.80. Breast MRI on 11/18/2020 with stable to slightly decreased size of known biopsy proven malignancies in the right breast, decreased size of the irregular mass with microclip at 11:00, 5 cm  from the nipple, consistent with known malignancy, nonmass enhancement spanning 40 mm has also slightly decreased in size and now demonstrates subthreshold kinetics, this remains suspicious, previously described oval enhancing mass at 4:00 is no longer identified.     01/03/2021 Patient presents for follow-up of breast cancer. Labs on 12/20/2020 showed WBC 6.4, hgb 9.7, platelets 278, ANC 5.00.     Hospital admission 10/23-10/25/2022. P/w?atypical?chest pain x 2-3 days?with new LBBB but negative troponin and relief w/ antacid medications most c/f upper GI pathology such as severe GERD, PUD, or gastritis.?CT AP without acute abnormality. Initiated pantoprazole 40 mg PO bid with improvement in symptoms with PPI and GI cocktail w/maalox. Patient able to tolerate all meals without recurrent chest pain. GI was consulted and given improvement in symptoms, EGD deferred. Continue?Maalox q4h PRN. H pylori stool Ag discontinued given long term PPI use. Continue dexelant outpatient. Ondansetron?4 mg PO q6hr PRN Rx for nausea. Outpatient GI follow-up.     10/18/2021 Patient presents for follow-up of breast cancer. Labs on 09/01/2021 showed WBC 4.94, hgb 11.8, platelets 342. Breast MRI 05/03/2021 with Interval decrease in size of known malignancies in the right breast with trace residual enhancement, Grossly stable irregular mass at 11 o'clock corresponding to biopsy-proven malignancy,  Interval resolution of previously described regional nonmass enhancement in the upper inner, upper outer, and lower outer quadrants. She admits to persistent diarrhea associated with significant weight loss. She often does not eat during the day because she is fearful of leaving the house d/t diarrhea. She does not take much imodium. The diarrhea is dependent on what she eats. Has longstanding hx of IBS. Diarrhea is improved when she takes dexilant but was unable to obtain the dexilant in the preferred form for a while. She denies B symptoms. Admits to occasional vomiting associated with the diarrhea. Continues with IVIG for myasthenia gravis. Has decided she would not like to pursue breast surgery due to age, co morbidities, anaesthetic risks.     11/14/2021 Patient presents for follow-up of breast cancer. Labs on 10/18/2021 showed WBC 4.7, hgb 12.7, platelets 379, ANC 3.81, CEA 5.00. CT chest 11/13/2021 (-). Met with Dr. Lindajo Royal today. Held Arimidex from last visit until yesterday. Diarrhea resolved while she held the Anastrazole. She actually became constipated.        ECOG performance status: 0  PAST HISTORY   PMH:  Patient Active Problem List    Diagnosis Date Noted   ? Atypical chest pain 12/20/2020   ? Acute chest pain 12/18/2020   ? Immunosuppressed status (HCC/RAF) 03/21/2020   ? HTN (hypertension) 10/31/2019   ? Aortic calcification (HCC/RAF) 09/25/2019   ? Refractive error 04/28/2019   ? Ptosis of eyelid 04/28/2019   ? Pseudophakia of both eyes 04/28/2019   ? Benzodiazepine dependence (HCC/RAF) 12/20/2018   ? Breast cancer (HCC/RAF) 12/20/2018   ? Spondylosis without myelopathy or radiculopathy, lumbar region 11/28/2018   ? Anxiety 11/27/2018   ? CKD (chronic kidney disease), stage III (HCC/RAF) 11/27/2018   ? Nocturnal hypoxemia 04/07/2018     Last Assessment & Plan:   We discussed potential impact of hypoxemia from her COPD on other body function  Plan-overnight oximetry     ? Major depression, recurrent, chronic (HCC/RAF) 12/16/2017   ? Diabetes mellitus (HCC/RAF) 12/16/2017   ? Osteoarthritis of knee 05/17/2017   ? H/O arthroscopy 03/06/2017   ? Myasthenia gravis with exacerbation (HCC/RAF) 12/18/2016   ? Vaginal atrophy 08/16/2016   ? Acute medial meniscal tear, left, subsequent  encounter 07/18/2016   ? Renal cyst 03/12/2016   ? Asthmatic bronchitis, mild persistent, with acute exacerbation 12/29/2015     Last Assessment & Plan:   Continue rescue inhaler as needed. Try adding sample Bevespi maintenance inhaler to see if this helps.  She is currently on prednisone 20 mg daily maintenance and I don't think adding a steroid inhaler component will make any difference.     ? Urethral caruncle 07/26/2015   ? Chronic cholecystitis 01/14/2015   ? Angioedema 05/23/2014     Attributed to injected methylprednisolone    Last Assessment & Plan:   We discussed limited ability to test for possible triggers. We are asking our labs sources about ability to test against methylprednisolone for injection.  A different brand might be worth trying. She can try pre-medicating with an antihistamine.     ? Chronic interstitial cystitis 10/01/2011   ? Insomnia 05/31/2007     Qualifier: Diagnosis of   By: Maple Hudson MD, Clinton D    Last Assessment & Plan:   I'm concerned that some of her sleep problems may reflect obstructive sleep apnea based on her palate and which she can tell me about her sleep patterns without a witness. We also want to know about oxygenation during sleep.  Plan-schedule sleep study  Qualifier: Diagnosis of   By: Maple Hudson MD, Clinton D      Last Assessment & Plan:   I'm concerned that some of her sleep problems may reflect obstructive sleep apnea based on her palate and which she can tell me about her sleep patterns without a witness. We also want to know about oxygenation during sleep.  Plan-schedule sleep study     ? COPD mixed type (HCC/RAF) 04/30/2007     Office Spirometry 03/16/2016-severe obstructive airways disease FVC 1.59/59%, FEV1 0.98/49%, ratio 0.62, FEF 25-75% 0.42/28%  PFT 04/16/16-severe obstructive airways disease with slight response to bronchodilator, severe diffusion defect. FEV1/FVC 0.64, DLCO 45%    Last Assessment & Plan:   Severe obstructive airways disease. She can't hear herself wheeze.  Plan-try samples of Trelegy instead of Anoro for comparison    Overview:   Office Spirometry 03/16/2016-severe obstructive airways disease FVC 1.59/59%, FEV1 0.98/49%, ratio 0.62, FEF 25-75% 0.42/28%  PFT 04/16/16-severe obstructive airways disease with slight response to bronchodilator, severe diffusion defect. FEV1/FVC 0.64, DLCO 45%    Last Assessment & Plan:   Severe obstructive airways disease. She can't hear herself wheeze.  Plan-try samples of Trelegy instead of Anoro for comparison  Office Spirometry 03/16/2016-severe obstructive airways disease FVC 1.59/59%, FEV1 0.98/49%, ratio 0.62, FEF 25-75% 0.42/28%  PFT 04/16/16-severe obstructive airways disease with slight response to bronchodilator, severe diffusion defect. FEV1/FVC 0.64, DLCO 45%    Last Assessment & Plan:   Severe COPD but mainly emphysema with limited bronchospasm component that could be addressed with bronchodilators.  Plan-try sample Bevespi as a maintenance controller, refill pro-air     ? Esophageal reflux 04/30/2007     History of fundoplication     Last Assessment & Plan:   Emphasized continued attention to antireflux measures. Reminded that reflux can be a significant trigger for irritable airways, cough and wheeze.  History of fundoplication       Last Assessment & Plan:   Emphasized continued attention to antireflux measures. Reminded that reflux can be a significant trigger for irritable airways, cough and wheeze.     ? Eustachian tube dysfunction 04/30/2007     Annotation: decreased hearing  Qualifier: Diagnosis of  By: Yetta Barre CNA/MA, Jessica       Last Assessment & Plan:   She is nearly deaf on a chronic basis. Management is mostly by her ENT physicians.     ? Fibromyalgia 04/30/2007     Qualifier: Diagnosis of   By: Yetta Barre CNA/MA, Rose Fillers: Diagnosis of   By: Yetta Barre CNA/MA, Shanda Bumps     ? Seasonal and perennial allergic rhinitis 04/30/2007     Qualifier: Diagnosis of   By: Yetta Barre CNA/MA, Shanda Bumps     Last Assessment & Plan:   She is now on prednisone 20 mg daily. I suggested she wait until her myasthenia status is stabilized. Then if she needs to she couldn't seek evaluation at one of the allergy practices in town as discussed, since our vaccine program will be closing.  Qualifier: Diagnosis of   By: Yetta Barre CNA/MA, Jessica       Last Assessment & Plan:   She is now on prednisone 20 mg daily. I suggested she wait until her myasthenia status is stabilized. Then if she needs to she couldn't seek evaluation at one of the allergy practices in town as discussed, since our vaccine program will be closing.     Tobacco: smoked  For 30 years     Surgical History:  No past surgical history on file.    Gyn Hx:  Menses 15   G2P2- first birth 59  Menopause :50's   Menopausal status: Postmenopausal  Hormone therapy use: None      Family and Social History  No family history on file.  Social History     Tobacco Use   ? Smoking status: Former     Types: Cigarettes     Quit date: 1985     Years since quitting: 38.7   ? Smokeless tobacco: Former   Substance Use Topics   ? Alcohol use: Not Currently       MEDS     No outpatient medications have been marked as taking for the 11/14/21 encounter (Office Visit) with Fredda Hammed., MD.     Allergies   Allergen Reactions   ? Beta Adrenergic Blockers Other (See Comments)     Avoid d/t Myasthenia Gravis  Avoid d/t Myasthenia Gravis     ? Levofloxacin Other (See Comments)     Flare of Myasthenia Gravis   ? Macrolides And Ketolides Other (See Comments)     Avoid d/t Myasthenia Gravis  Use with caution d/t Myasthenia Gravis  Use with caution d/t Myasthenia Gravis     ? Sulfa Antibiotics Other (See Comments)     May possibly have caused deafness in one ear  May possibly have caused deafness in one ear  other     ? Botulinum Toxins Other (See Comments)     Muscle weakness r/t Myasthenia Gravis  Muscle weakness r/t Myasthenia Gravis     ? Statins Other (See Comments)     Muscle aches  Muscle aches  Muscle aches     ? Plastic Tape Itching and Other (See Comments)     Turns the skin red where applied       PHYSICAL EXAM     Last Recorded Vital Signs:    11/14/21 1356   BP: 152/74   Pulse: 88   Resp: 17   Temp: 36.6 ?C (97.8 ?F)   SpO2: 95%      Body mass index is 22.37 kg/m?Marland Kitchen  Wt Readings from Last 3 Encounters:   11/14/21 61  kg (134 lb 6.4 oz)   11/08/21 60.3 kg (133 lb)   10/18/21 59.6 kg (131 lb 6.4 oz)       Review of Systems:  Pertinent items are noted in HPI.      Physical Exam:  General: No acute distress. Appears well-developed, well-nourished and close to stated age.   Head: Normocephalic, atraumatic.  Eyes: Sclera anicteric. EOMI.  ENT: Hearing grossly normal bilaterally. Oropharynx is clear, mucus membranes are moist.  No oral ulcers noted. Good dentition.  Neck: Supple. Trachea midline.   Cardiac: Regular rate and rhythm. Normal S1, S2. No murmurs, rubs, or gallops.  Respiratory: Clear to auscultation bilaterally. No wheezes, rales, or rhonchi noted. Respiratory effort appears normal.   Abdomen: Soft, nontender and nondistended. Bowel sounds are present and normoactive. No organomegaly is appreciated.  Musculoskeletal: No edema. No cyanosis. Extremities are warm and well-perfused.   Extremities: Warm. No edema. No cyanosis.  Neurologic: Gait appears normal. Sensation intact to light touch in all four extremities. Oriented to person, place and time.   Hematologic: No bruising, purpura or petechiae are noted.   Dermatologic: Skin intact.  No rashes appreciated.   Lymphatic: No palpable cervical, supraclavicular, axillary or inguinal adenopathy appreciated.   Psychiatric: Affect appropriate.  Pleasant and conversant.   Breast exam: no breast masses or axillary adenopathy noted     Recent Labs:  Lab Results   Component Value Date    WBC 4.7 10/18/2021    HGB 12.7 10/18/2021    HCT 38.2 10/18/2021    MCV 103.0 (H) 10/18/2021    PLT 379 (H) 10/18/2021      Lab Results   Component Value Date    NA 137 10/18/2021    K 4.59 10/18/2021    CL 101.6 10/18/2021    CO2 31.6 (H) 10/18/2021    CREAT 1.6 (H) 10/18/2021    BUN 25.0 10/18/2021     Lab Results   Component Value Date    ALT 6 (L) 10/18/2021    AST 16 10/18/2021    ALKPHOS 54 10/18/2021    BILITOT 0.30 10/18/2021     Lab Results   Component Value Date    CALCIUM 9.7 10/18/2021       Pertinent imaging:  11/13/2021 CT chest wo  History of bilateral breast cancer without definite metastatic disease in the thorax  ?  05/03/2021 MR breast  1. Right breast:   *  Interval decrease in size of known malignancies in the right breast with trace residual enhancement. Recommend continued surgical and oncological management. BI-RADS CATEGORY 6 - KNOWN BIOPSY-PROVEN MALIGNANCY.   ?  2. Left breast:  *  Grossly stable irregular mass at 11 o'clock corresponding to biopsy-proven malignancy. Recommend continued surgical and oncological management. BI-RADS CATEGORY 6 - KNOWN BIOPSY-PROVEN MALIGNANCY.   *  Interval resolution of previously described regional nonmass enhancement in the upper inner, upper outer, and lower outer quadrants. Recommend continued surgical management. BI-RADS CATEGORY 4 - SUSPICIOUS.   ?  3. Additional findings:  *  STIR hyperintense fluid collection in the right glenohumeral joint/subacromial region may represent bursitis. Correlate with symptoms/physical exam.  ?  OVERALL ASSESSMENT: BI-RADS CATEGORY 4 - SUSPICIOUS.    05/03/2021 MR cervical spine  Redemonstrated congenital cervical spinal canal stenosis with superimposed multilevel degenerative changes, which are mildly progressive compared to prior as above. No evidence of cord compression or cord signal abnormality.  ?  03/14/2021 NM stress test  1) There is a moderate-severe, fixed  perfusion defect in the distal anteroseptal wall and apex suggestive for a prior MI.   ?  No scintigraphic evidence for stress-induced ischemia.    ?  2) Normal left ventricular size and reduced global LV systolic function, EF 36%. There are regional wall motion abnormalities present as indicated above.  ?  3) For physiologic and electrocardiographic findings, please refer to a separate report.  ?  4) There are no prior studies available for comparison.      12/18/2020 CT abd/pelvis wo contrast  1.  No acute CT abnormality in the abdomen or pelvis.  2.  Moderate hiatal hernia with postsurgical changes likely related to reported prior Nissen fundoplication.  3.  Left-sided diverticulosis without evidence diverticulitis.  4.  History of bilateral breast cancer without evidence of metastatic disease within the abdomen or pelvis.    12/18/2020 CTA chest   1.  No acute aortic process. No acute process within the chest.  2.  History of bilateral breast cancer without evidence of intrathoracic metastatic disease.  3.  Additional findings as above.    11/18/2020 MRI breast bilat  1. Right breast:   *  Stable to slightly decreased size of known biopsy proven malignancies in the right breast. BI-RADS CATEGORY 6 - KNOWN BIOPSY-PROVEN MALIGNANCY. Recommend continued surgical and oncologic management.   ?  2. Left breast:  *  Decreased size of the irregular mass with microclip at 11:00, 5 cm from the nipple, consistent with known malignancy. BI-RADS CATEGORY 6 - KNOWN BIOPSY-PROVEN MALIGNANCY. Recommend continued surgical and oncologic management.   *  Nonmass enhancement spanning 40 mm has also slightly decreased in size and now demonstrates subthreshold kinetics. This remains suspicious. If breast conservation is desired, an MRI guided biopsy is recommended. BI-RADS CATEGORY 4 - SUSPICIOUS.  *  Previously described oval enhancing mass at 4:00 is no longer identified. BI-RADS CATEGORY 2.  ?  OVERALL ASSESSMENT: BI-RADS CATEGORY 4 - SUSPICIOUS.    08/26/2020 DXA lumbar spine/hip  -----------------------------------------------------------------   Region ? ? ? ? ? ? ? ? ? BMD ? ?T-score ?Z-score ? Classification   -----------------------------------------------------------------   AP Spine(L1-L4) ? ? ? ? ?1.062 ? ?0.1 ? ? ?2.9 ? ? ? Normal   Femoral Neck (Left) ? ? ?0.723 ? -1.1 ? ? ?1.3 ? ? ? Osteopenia   Total Hip (Left) ? ? ? ? 0.725 ? -1.8 ? ? ?0.4 ? ? ? Osteopenia   -----------------------------------------------------------------   Impression: The BMD for  the AP Spine(L1-L4) decreased, changing by -2.4% since the last DXA exam.     08/22/2020 PETCT  1.  Mild to moderate left greater than right FDG activity associated with small soft tissue densities in the bilateral breasts in the regions marked by breast biopsy clips.   2.  No evidence of FDG avid metastatic disease.    06/19/2019 US pelvis  Small subserosal uterine fundal fibroid. Otherwise normal postmenopausal pelvic ultrasound.        Pertinent pathology:  08/02/2020 [facility] genetics      07/13/2020 Tissue exam  A. BREAST, RIGHT, MASS, 12:00, 6 CM FROM NIPPLE (NEEDLE CORE BIOPSY):  - Invasive ductal carcinoma with lobular and focal cribriform features, grade 2 (50% of biopsy; longest segment 4 mm)             Modified Bloom and Richardson score: 6 of 9  Tubule formation:                3                        Nuclear pleomorphism:       2                        Mitotic score:                       1  - Ductal carcinoma in situ (DCIS), low nuclear grade, cribriform type with focal central necrosis  - Breast biomarkers: See below  - ERBB2 (HER2) FISH: Negative for HER2 gene amplification   ?  B.  BREAST, LEFT, MASS, 11:00, 5 CM FROM NIPPLE (NEEDLE CORE BIOPSY):  - Invasive ductal carcinoma with lobular features, grade 2 (50% of biopsy; longest involved segment 6 mm)             Modified Bloom and Richardson score: 6 of 9                        Tubule formation:                3                        Nuclear pleomorphism:       2                        Mitotic score:                       1  - Ductal carcinoma in situ (DCIS) is not identified  - Breast biomarkers: See immunohistochemistry report below  - ERBB2 (HER2) FISH: Negative [based on IHC (2+) and FISH, see comment].     COMMENT: It is uncertain whether patients with an average of >4.0 and <6.0 HER2?signals per cell and a HER2/CEP17 ratio of <2.0 benefit from HER2-targeted therapy in the absence of protein overexpression (IHC 3+). If the specimen test result is close to the [facility] ratio threshold for positive, there is a higher likelihood that repeat testing will result in different results by chance alone. Therefore, when [facility] results are not 3+ positive, it is recommended that the sample be considered negative without additional testing on the specimen.    BREAST BIOMARKER REPORT  ?  BLOCK:  A1  FIXATIVE:  Formalin   ?  RESULT ER/PR:  ? ESTROGEN   RECEPTOR PROGESTERONE RECEPTOR Antibody Clone SP1 Clone 636   %Tumor Staining >95% >95%   Intensity (1+ to 3+) 3+ 3+   ?  RESULT HER-2/neu IHC assay (utilizing FDA-approved DAKO Hercep Test): Arch Pathol Lab Med [phone], ASCO/CAP HER2 Testing in Breast Cancer Update - Veronia Beets al  ?  Test Score: 1+  ?  HER2/neu:  Negative  ?  RESULT Ki-67 (Clone MIB1):  5%  ?  BREAST BIOMARKER REPORT  ?  BLOCK:  B1  FIXATIVE:  Formalin   ?  RESULT ER/PR:  ? ESTROGEN   RECEPTOR PROGESTERONE RECEPTOR   Antibody Clone SP1 Clone 636   %Tumor Staining >95% 0%   Intensity (1+ to 3+) 3+ NA   ?  RESULT HER-2/neu IHC assay (utilizing FDA-approved DAKO Hercep Test): Arch Pathol Lab Med 2018:1-20, ASCO/CAP HER2 Testing  in Breast Cancer Update - Veronia Beets al  ?  Test Score: 2+  ?  HER2/neu:  Equivocal  ?  RESULT Ki-67 (Clone MIB1):  10%    07/19/2017 Outside pathology        ASSESSMENT & PLAN   Amilah Greenspan is a 86 y.o. female presents for     1. 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 She elected not to have surgery at that time. She took tamoxifen-->anastrozole for a short period of time, but discontinued endocrine therapy due to side effects.   - R breast biopsy at 12:00, 6 cm from nipple on 07/13/2020 revealed invasive ductal carcinoma with lobular and focal cribriform features, grade 2 ER (+) PR (+) HER2 (-), Ki-67 5%  - L breast biopsy at 11:00, 5 cm from nipple on 07/13/2020 revealed invasive ductal carcinoma with lobular features, grade 2 ER (+) PR (-) HER2 (-), Ki-67 10%  - We had a long discussion about biology and treatment of breast cancer, with emphasis that cure can only be attained with multimodality treatments such as surgery/ +/- radiation and systemic ( endocrine) therapy .  We have discussed that given her concern for anesthesia with Myasthenia Gravis, we cat try neoadjuvant endocrine therapy with close observation of side effects.  - We will start letrozole 2.5 mg po daily 08/17/2020 --> 10/28/2020 discontinued due to diarrhea  - tumors are not palpable on exam, and she would have to be followed with imaging   -  PETCT on 08/22/2020 with no evidence of FDG avid metastatic disease.   - Arimidex 10/28/2020 --->   - Breast MRI on 11/18/2020 with stable to slightly decreased size of known biopsy proven malignancies in the right breast, decreased size of the irregular mass with microclip at 11:00, 5 cm from the nipple, consistent with known malignancy, nonmass enhancement spanning 40 mm has also slightly decreased in size and now demonstrates subthreshold kinetics, this remains suspicious, previously described oval enhancing mass at 4:00 is no longer identified.   - CT CAP on 12/18/2020 without evidence of metastatic disease   - Breast MRI on 11/18/2020 with stable to slightly decreased size of known biopsy proven malignancies in the right breast, decreased size of the irregular mass with microclip at 11:00, 5 cm from the nipple, consistent with known malignancy, nonmass enhancement spanning 40 mm has also slightly decreased in size and now demonstrates subthreshold kinetics, this remains suspicious, previously described oval enhancing mass at 4:00 is no longer identified.    11/14/2021  - HeldArimidex 1 mg po daily d/t diarrhea (8/23-9/18/2023)  - Patient has opted against breast surgery d/t age, co morbidities, anaesthetic risk iso myasthenia gravis  - She would like to explore alternatives to surgery such as radiation. Discussed case with rad/onc team and will obtain repeat breast imaging. She consulted with Dr. Lindajo Royal today who advised that she is not currently a candidate for RT.   - Diagnostic bilateral MMG/US ordered to assess status of disease- scheduled 12/1  - Will try switching to Exemestane 25mg  po daily to evaluate for improvement in diarrhea- suspect class effect, but will try     2.  Diarrhea  - Longstanding, hx of IBS, but exacerbated by AI use, similar on letrozole and arimidex.  - Consult with Dr. Riley Kill 02/10/2021. May be related to aromatase inhibitor. However  she states it has been worse in the last 1-2 months and this medication class was started earlier in the year (attributed diarrhea to letrozole, swithced to arimidex in Sept). Will r/o infection, check FC. Can consider SIBO evaluation, colonoscopy for microscopic colitis.     11/14/2021  - Held anastrazole 10/18/2021-->11/13/2021 with resolution of diarrhea.   - MR Abd/Pelv for further assessment given >20lbs weight loss associated with diarrhea- scheduled 9/28  - Strongly encouraged patient to submit stool studies ordered by Dr. Riley Kill  - Will start with 1/2 tablet imodium q AM and increase to 1 tablet daily as needed  - Consider lomotil if imodium is ineffective or not tolerated  - Will try to switch to exemestane     3.  Myasthenia gravis   - F/u w Dr. Claudie Leach, Neurology, on 08/15/2021. Referral for PT regarding neck exercise. Referral for Acupuncture. Physical Therapy referral for gait and balance training for cervical spinal stenosis. Contact number for scheduling: [phone]. Continue IVIg 60g (1g/kg) frequency to every 3 weeks at home with Allur.Continue Azathioprine 75 mg twice per day by mouth.     4.  Chest pain  - Hospital admission 10/23-10/25/2022. P/w?atypical?chest pain x 2-3 days?with new LBBB but negative troponin and relief w/ antacid medications most c/f upper GI pathology such as severe GERD, PUD, or gastritis.?CT AP without acute abnormality. Initiated pantoprazole 40 mg PO bid with improvement in symptoms with PPI and GI cocktail w/maalox. Patient able to tolerate all meals without recurrent chest pain. GI was consulted and given improvement in symptoms, EGD deferred. Continue?Maalox q4h PRN. H pylori stool Ag discontinued given long term PPI use. Continue dexelant outpatient. Ondansetron?4 mg PO q6hr PRN Rx for nausea.   - Outpatient GI follow-up scheduled w Dr. Riley Kill on 01/12/2021      5. Bone density  - DXA scan on 08/26/2020 demonstrated osteopenia of the left femoral neck and total hip.        DIAGNOSES/ORDERS addressed on 11/14/2021:  1. Breast cancer (HCC/RAF)      - on Arimidex - continue close monitoring for toxicity of intensive drug therapy.    NEW PROBLEMS AS OF 11/14/2021:     []  New minor or self-limited problem (99202/99212).  []  2+ minor or self-limited problem, stable chronic illness, acute uncomplicated illness/injury (99203/99213).  []  Chronic illness with exacerbation/progression/tx side effect; 2+ stable chronic illnesses, new problem with uncertain prognosis, acute illness with systemic symptoms, or acute complicated injury (99204/99214).  []  Chronic illness with severe exacerbation/progression/tx side effect, acute/chronic illness or injury that poses threat to life or bodily function (99205/99215).      REVIEW OF DATA   I have  [x]  Reviewed/ordered []  1 []  2 [x]  ? 3 unique laboratory, radiology, and/or diagnostic tests noted below  Orders Placed This Encounter   ? Referral for Authorization of Chemo Medications     Referral Priority:   Routine     Referral Type:   O/P Surgery or Procedure     Number of Visits Requested:   1   ? exemestane 25 mg tablet     Sig: Take 1 tablet (25 mg total) by mouth daily.     Dispense:  30 tablet     Refill:  3     12/18/2020 CT abd/pelvis wo contrast  12/18/2020 CTA chest   12/20/2020 CBC, BMP    [x]  Reviewed []  1 []  2 [x]  ? 3 prior external notes and incorporated into patient assessment   I reviewed Dr.  Velvet Bathe, MD in Surgery, Surgical Oncology on 08/10/2020.  I reviewed Dr. York Ram, MD in Medicine, Internal Medicine on 07/13/2020.  I reviewed Dr. Johnney Killian. Desma Maxim, MD in Obstetrics & Gynecology on 05/07/2019.    []  Discussed management or test interpretation with external provider(s) on 11/14/2021 as noted:    RISK OF COMPLICATION   This writer has deemed the above diagnoses to have a risk of complication, morbidity or mortality of:   []  Minimal  []  Low  []  Moderate   []  due to prescription drug management   [] Decision re: minor surgery   [] Diagnosis or treatment limited by social determinants of health  []  Severe    []  due to intensive monitoring for chemotherapy toxicity   []  Decision elective surgery with risk factors  []  Decision regarding need for hospitalization  []  Advanced Care Planning decisions    SOCIAL DETERMINANTS OF HEALTH   []  The diagnosis or treatment of said conditions is significantly limited by social determinants of health:      I spent 30 minutes counseling and coordinating care for the items checked below on the day of service 11/14/2021  [x]  Preparing to see the patient (e.g., review of tests)  [x]  Obtaining and or reviewing separately obtained history   [x]  Performing a medically appropriate examination and/or evaluation   [x]  Counseling and educating the patient/family/caregiver   [x]  Ordering medications, tests, or procedures  []  Referring and communicating with other healthcare professionals (when not separately reported)  [x]  Documenting clinical information in the EHR  []  Independently interpreting results and communicating results to patient/ family/caregiver     Return in about 2 months (around 01/14/2022), or if symptoms worsen or fail to improve.       The above recommendation were discussed with the patient. The patient has all questions answered satisfactorily and is in agreement with this recommended plan of care.    Attestation:  Orthoptist:  I, Candis Musa, have scribed for [employer]. Oscar La, MD with the documentation for [name] on 11/14/2021.    Physician Signature:   Rhona Leavens. Oscar La, MD 11/14/2021 3:19 PM    I have reviewed this note, scribed by Candis Musa and attest that it is an accurate representation of my H & P and other events of the outpatient visit except if otherwise noted.

## 2021-11-14 ENCOUNTER — Ambulatory Visit: Payer: BLUE CROSS/BLUE SHIELD | Attending: Hematology & Oncology

## 2021-11-14 ENCOUNTER — Inpatient Hospital Stay: Payer: BLUE CROSS/BLUE SHIELD | Attending: Radiation Oncology

## 2021-11-14 DIAGNOSIS — C50812 Malignant neoplasm of overlapping sites of left female breast: Secondary | ICD-10-CM

## 2021-11-14 DIAGNOSIS — Z170 Estrogen receptor positive status [ER+]: Secondary | ICD-10-CM

## 2021-11-14 DIAGNOSIS — C50811 Malignant neoplasm of overlapping sites of right female breast: Secondary | ICD-10-CM

## 2021-11-14 MED ORDER — EXEMESTANE 25 MG PO TABS
25 mg | ORAL_TABLET | Freq: Every day | ORAL | 3 refills | Status: AC
Start: 2021-11-14 — End: ?

## 2021-11-14 NOTE — Patient Instructions
Please use imodium for diarrhea. Ok to take 2 pills with each loose stool, but be cautious as too much imodium can cause diarrhea.   Please start with 1/2 tablet imodium daily. You can increase to 1 tablet daily if needed.   We will try switching to exemestane.

## 2021-11-18 ENCOUNTER — Ambulatory Visit: Payer: BLUE CROSS/BLUE SHIELD

## 2021-11-22 ENCOUNTER — Ambulatory Visit: Payer: BLUE CROSS/BLUE SHIELD | Attending: Geriatric Medicine

## 2021-11-22 DIAGNOSIS — Z23 Encounter for immunization: Secondary | ICD-10-CM

## 2021-11-22 DIAGNOSIS — R040 Epistaxis: Secondary | ICD-10-CM

## 2021-11-22 NOTE — Progress Notes
OUTPATIENT GERIATRICS CLINIC NOTE    PATIENT:  [name]   MRN:  [id number]  DOB:  [birth year]  DATE OF SERVICE:  11/22/2021  PRIMARY CARE PHYSICIAN: York Ram., MD    CHIEF COMPLAINT:   No chief complaint on file.      [place] Specialists:  [employer]    Non- Specialists:    Chaperone status:  No data recorded    HISTORY OF PRESENT ILLNESS     [name] is a 86 y.o. female who presents today for *follow up**. Patient is accompanied by *no one**. History today is per the patient,and review of available recent records in [facility] and Care Everywhere.     Patient is a(n) [x]  reliable []  unreliable historian and additional collateral information was obtained during the visit today from:       Got exemestane but didn't start it.  Stopped duloxetine about a month ago.          Per chart review, pertinent medical history:  Past Medical History:   Diagnosis Date   ? Breast cancer (HCC/RAF) 12/20/2018     No past surgical history on file.    ALLERGIES     Allergies   Allergen Reactions   ? Beta Adrenergic Blockers Other (See Comments)     Avoid d/t Myasthenia Gravis  Avoid d/t Myasthenia Gravis     ? Levofloxacin Other (See Comments)     Flare of Myasthenia Gravis   ? Macrolides And Ketolides Other (See Comments)     Avoid d/t Myasthenia Gravis  Use with caution d/t Myasthenia Gravis  Use with caution d/t Myasthenia Gravis     ? Sulfa Antibiotics Other (See Comments)     May possibly have caused deafness in one ear  May possibly have caused deafness in one ear  other     ? Botulinum Toxins Other (See Comments)     Muscle weakness r/t Myasthenia Gravis  Muscle weakness r/t Myasthenia Gravis     ? Statins Other (See Comments)     Muscle aches  Muscle aches  Muscle aches     ? Plastic Tape Itching and Other (See Comments)     Turns the skin red where applied        MEDICATIONS     Personally reviewed.    No outpatient medications have been marked as taking for the 11/22/21 encounter (Appointment) with York Ram., MD.       SOCIAL HISTORY     Social History     Social History Narrative   ? Not on file        FUNCTIONAL STATUS     BADLs:  IADLs:    Ambulates with   Falls in past year:  Afraid of falling:    GERIATRIC REVIEW OF SYSTEMS     Vision:    []   glasses     []  legally blind  Hearing: []  hearing aids    Nutrition: []  normal   []  impaired  []  vegan  []  vegetarian  []  low salt  []  low-carb   Swallowing: []  impaired   Dentures:   []  yes    Depression:  []  yes   Cognition:     Incontinence: [] urine  []  fecal  []  urine and fecal      ADVANCED CARE PLANNING     Advanced directives on file: []  No  []   Yes ? Completed:     Medical DPOA on file:   []   Yes ?   []   No ? Patient designates ** * to be their surrogate medical decision-maker.         HEALTH CARE MAINTENANCE     IMMUNIZATIONS:   Immunization History   Administered Date(s) Administered   ? COVID-19, mRNA, (Pfizer - Purple Cap) 30 mcg/0.3 mL 03/11/2019, 04/01/2019, 10/29/2019   ? COVID-19, mRNA, bivalent (Pfizer) 30 mcg/0.3 mL (12y and up) 11/25/2020, 07/05/2021   ? COVID-19, mRNA, tris-sucrose (Pfizer - [employer]) 30 mcg/0.3 mL 06/20/2020   ? DTaP 12/31/2015   ? influenza vaccine IM quadrivalent (Fluzone Quad) MDV (36 months of age and older) 11/26/2016   ? influenza vaccine IM quadrivalent adjuvanted (FluAD Quad) (PF) SYR (47 years of age and older) 11/05/2018, 12/01/2019, 11/25/2020   ? influenza, unspecified formulation 05/10/2014, 12/29/2015, 11/26/2016, 04/01/2019, 04/01/2019, 12/01/2019, 12/01/2019, 12/01/2019, 12/01/2019, 11/25/2020, 11/25/2020   ? pneumococcal conjugate vaccine 20-valent (Prevnar 20) 07/05/2021   ? pneumococcal polysaccharide vaccine 23-valent (Pneumovax) 01/10/2019       Mammogram:  DXA:  PAP:  Colonoscopy:      PHYSICAL EXAM     BP 153/70 Comment: 1st reading ~ Pulse 82  ~ Temp 36.3 ?C (97.4 ?F) (Temporal)  ~ Resp 16  ~ Ht 5' 5'' (1.651 m)  ~ Wt 135 lb (61.2 kg)  ~ SpO2 96%  ~ BMI 22.47 kg/m?   Wt Readings from Last 3 Encounters:   11/14/21 134 lb 6.4 oz (61 kg)   11/08/21 133 lb (60.3 kg)   10/18/21 131 lb 6.4 oz (59.6 kg)         System Check if normal Positive or additional negative findings   GEN  [x]  NAD     Eyes  []  Conj/Lids []  Pupils  []  Fundi   []  Sclerae []  EOM     ENT  []  External ears   []  Otoscopy   []  Gross Hearing    []   External nose   []  Nasal mucosa   []  Lips/teeth/gums    []  Oropharynx    []  Mucus membranes      Neck  []  Inspection/palpation    []  Thyroid     Resp  []  Effort    []  Auscultation       CV  []  Rhythm/rate   []  Murmurs   []  Edema   []  JVP non-elevated    Normal pulses:   []  Radial []  Femoral  []  Pedal     Breast  []  Inspection []  Palpation     GI  []  Bowel sounds    []  Nontender   []  No distension    []  No rebound or guarding   []  No masses   []  Liver/spleen    []  Rectal     GU  F:  []  External []  vaginal wall         []  Cervix     []  mucus        []  Uterus    []  Adnexa   M:  []  Scrotum []  Penis         []  Prostate     Lymph  []  Cervical []  supraclavicular     []  Axillae   []  Groin/inguinal     MSK []  Gait  []  Back     Specify site examined:    []  Inspect/palp []  ROM   []  Stability []  Strength/tone []  Used arms to push up from seated to standing position   Assistive device:  []  single point cane []   quad cane  []  FWW []  rollator walker      Skin  []  Inspection []  Palpation     Neuro  []  Alert and oriented     []  CN2-12 intact grossly   []  DTR      []  Muscle strength      []  Sensation   []  Pronator drift   []  Finger to Nose/Heel to Shin   []  Romberg     Psych  []  Insight/judgement     []  Mood/affect    []  Gross cognition            LABS/STUDIES     LABS:  Lab Results   Component Value Date    WBC 4.7 10/18/2021    WBC 4.94 09/01/2021    WBC 6.14 03/16/2021    HGB 12.7 10/18/2021    HGB 11.8 09/01/2021    HGB 12.4 03/16/2021    MCV 103.0 (H) 10/18/2021    PLT 379 (H) 10/18/2021    PLT 342 09/01/2021    PLT 434 (H) 03/16/2021     Lab Results   Component Value Date    NA 137 10/18/2021    NA 139 09/20/2021    NA 136 09/01/2021    K 4.59 10/18/2021    K 4.5 09/20/2021    K 4.1 09/01/2021    CREAT 1.6 (H) 10/18/2021    CREAT 1.86 (H) 09/20/2021    CREAT 1.67 (H) 09/01/2021    GFRESTNOAA 29 06/09/2020    GFRESTNOAA 28 05/18/2020    GFRESTNOAA 30 03/23/2020    GFRESTAA 34 06/09/2020    GFRESTAA 33 05/18/2020    GFRESTAA 35 03/23/2020    CALCIUM 9.7 10/18/2021    CALCIUM 9.6 09/20/2021    CALCIUM 9.9 09/01/2021     Lab Results   Component Value Date    ALT 6 (L) 10/18/2021    ALT 10 09/01/2021    AST 16 10/18/2021    AST 30 09/01/2021    ALKPHOS 54 10/18/2021    BILITOT 0.30 10/18/2021    ALBUMIN 3.8 10/18/2021    ALBUMIN 4.0 09/01/2021     Lab Results   Component Value Date    TSH 2.7 09/01/2021    TSH 1.5 03/16/2021    TSH 2.8 03/23/2020     Lab Results   Component Value Date    HGBA1C 5.2 09/01/2021    HGBA1C 5.1 03/16/2021    HGBA1C 5.2 03/23/2020     Lab Results   Component Value Date    CHOL 237 09/01/2021    CHOL 199 06/03/2021     Lab Results   Component Value Date    CHOLDLQ 147 (H) 09/01/2021    CHOLDLQ 128 (H) 06/03/2021     Lab Results   Component Value Date    VITD25OH 76 10/18/2021     Lab Results   Component Value Date    FE 87 07/14/2019    FERRITIN 57 07/14/2019    FOLATE 9.6 11/25/2019    TIBC 356 07/14/2019     Lab Results   Component Value Date    VITAMINB12 3,994 (H) 11/25/2019    VITAMINB12 217 (L) 09/22/2019     Lab Results   Component Value Date    BNP 156 (H) 06/03/2021    BNP 92 09/22/2019     No results found for: ''PSATOTAL''      STUDIES:    EKG  This data element was independently reviewed and interpreted by me  NSR NO ST or T changes    XR CHEST PA LAT 2V  September 22, 2019?  COMPARISON: [facility] December 17, 2018  ?  History: sob  ?  FINDINGS:  ?  Lungs: Clear  Heart/aorta: Normal heart size. The thoracic aorta is mildly calcified, tortuous and ectatic.  Adenopathy: None  Pleura: No effusion  Bones and Chest wall: No acute bony or body wall  findings. Osteopenia and bony maturational changes. Right upper quadrant cholecystectomy clips stable from October 2020  ?  ?  IMPRESSION:  ?  No acute findings or abnormalities related to provided history.      MRI C spine  May 03, 2021  IMPRESSION:  Redemonstrated congenital cervical spinal canal stenosis with superimposed multilevel degenerative changes, which are mildly progressive compared to prior as above. No evidence of cord compression or cord signal abnormality.      Echo  Jan 2023  1. Normal left ventricular size.  2. There are LV regional wall motion abnormalities.  3. Left ventricular ejection fraction is approximately 35 to 40%.  4. Abnormal LV diastolic function.  5. There are no prior studies on this patient for comparison purposes.  ASSESSMENT and PLAN     Erykah Lippert is a 86 y.o. female who presents today for *follow up**.    #Major Depression--active--self d/ced duloxetine.  Rechallenge.  Follow up in 6 weeks to assess tolerance and efficacy.      #HFrEF--EF 35-40%noted on echo-- 2023--Not Worsening.  Chronic.  Will monitor and treat underlying risk factors with medical and lifestyle interventions as warranted by balance between benefits and burdens.   #CAD--discussed medical management alone with shared decision making model May 11, 2021.  Declines staint  #Cervical myelopathy--monitored by neuro--chronic--re-imaged on MRI as above.  CTM.  #HTN--Increased nifedipine CR 60 mg daily. Continue losartan 50 mg.  BP Better.    #CKD--stage 4--chronic--CTM--control BP as tolerated by symptoms of orthostasis and fatigue  #CAD--patient leaning towards medical management.  #Aortic Calcification--noted on chest imaging     September 22, 2019?       --Not Worsening.  Chronic.  Will monitor and treat underlying risk factors with medical and lifestyle interventions as warranted by balance between benefits and burdens.  #allergic rhinitis--trial flonase  #Macrocytosis--  #Deconditioning--initiate home PT  #GERD--s/p Nisan fundoplication  #COPD--mixed--per PFT's from [name].--albuterol prn.  May resume other inhalders.  #s/p rectocele repair  #s/p cholecystectomy  #Immunosupressed status--medication related.  Chronic. CTM  #Nocturnal hypoxemia--presumably related to MG crisis but has history of COPD. ?Will order nocturnal home O2 study and consider PFT's/Pulmonary evaluation once settled down. ?May need to restart BREO inhaler.  #Myasthenia Gravis--s/p plasmapheresis x5 October 2020, no Thymoma, on Azathioprine 150 daily and mestinon 60 tid.??Saw?[facility]. Enedina Finner [facility]. ?Was considering Soliris.??Ordering IVIG and MRI.  Encourage ongoing neurology follow up.   #IBS--titrate miralax to comfort. ?  #Interstitial cystitis--on daily Trimethoprim 100 for UTI prophylaxis. ?Using bladder instillations prn. OFF topical estrogen due to advice from neurologist re: MG.  To see Dr. Lorenz Coaster.  Suspect recent flare related to breast CA re-diagnosis.  Doubt pseudomonas bactiuria is pathogen at present.  WIll complete current meropenum.    #Allergic rhinitis--previously on nasal steroids.  #Breast CA--under care.  Patient continues to decline surgery.  Wondering about additional treatment options.  Will direct to med-onc for discussion.  #DM with CKD--stabe 3b--based on labs from [name]. ?FOllow here. ?If BP permits, may consider ARB BUT patient with h/o angioedema so will ONLY  initiate this agent with great caution.  Refer to Ophthalmology.  #OA knees--previously getting hyaluronic acid injections.  #Anxiety  #Benzodiazepine Dependence--uncomplicated--chronic.  Encourage patient to decrease intake--ideally to abstaining but will work towards this goal over time.  Not likely to achieve this given ongoing concerns re: breast ca.  #Fibromyalgia--duloxetine as above.   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  #Statin intolerance      FOLLOW-UP     RTC     Future Appointments   Date Time Provider Department Center   11/22/2021 11:20 AM York Ram., MD GERI IMS [address] [place]/C[facility]   11/23/2021  1:00 PM [facility] MR01 3T MRINOHO [facility]att[facility]   11/23/2021  1:45 PM NOHO MR01 3T MRINOHO [facility]   01/08/2022 11:45 AM Clelia Croft, MD CRD DIS [facility]   01/11/2022  1:20 PM Fredda Hammed., MD HEM/ONC [address]   01/26/2022  2:30 PM MP2 MAM PANEL01 (HOSP) MAM MP2 [place]/Cen   02/13/2022 11:30 AM [facility] NEUMUSC B200 [place]/Cen         The above plan of care, diagnosis, orders, and follow-up were discussed with the patient and/or surrogate. Questions related to this recommended plan of care were answered.    ANALYSIS OF DATA (Needs to meet 1 category for moderate and 2 categories for high LOS)     I have:     Category 1 (Needs 3 for moderate and high LOS)     []  Reviewed []  1 []  2 []  ? 3 unique laboratory, radiology, and/or diagnostic tests noted below    Test/Study:  on date .    []  Reviewed []  1 []  2 []  ? 3 prior external notes and incorporated into patient assessment    I reviewed Dr. 's note in specialty  from date .    []  Discussed management or test interpretation with external provider(s) as noted      []  Ordered []  1 []  2 []  ? 3 unique laboratory, radiology, and/or diagnostic tests noted in A&P    []  Obtained history from independent historian:       Category 2  []  Independently interpreted the test    Category 3  []  Discussed management or test interpretation with external provider(s) as noted      INTERACTION COMPLEXITY and SOCIAL DETERMINANTS of HEALTH       PROBLEM COMPLEXITY   []  New problem with uncertain diagnosis or prognosis (moderate)   []  Multiple stable chronic problems (moderate)   []  Chronic problem not stable - not controlled, symptomatic, or worsening (moderate)   []  Severe exacerbation of chronic problem (high)   []  New or chronic problem that poses threat to life or bodily function (high)     MANAGEMENT COMPLEXITY   []  Old or external/outside records reviewed   []  Discussion with alternate (proxy) if patient with impaired communication / comprehension ability (e.g., dementia, aphasia, severe hearing loss).   []  Repeated questions (or disagreement) between patient and/among caregivers/family during the visit.  []  Caregiver/patient emotions/behavior/beliefs interfering with implementation of treatment plan.  []  Independent interpretation of test (EKG, Chest XRay)   []  Discussion of case with a consultant physician     RISK LEVEL   []  Prescription drug management (moderate)   []  Minor surgery with CV risk factors or elective major surgery (moderate)   []  Dx or Rx significantly limited by SDoH (inadequate housing, living alone, poor health care access, inappropriate diet; low literacy) (moderate)   []  Major  surgery - elective with CV risk factors or emergent (high)   []  Need for hospitalization (high)   []  New DNR or de-escalation of care (high)      SDoH  The diagnosis or treatment of said conditions is significantly limited by the following social determinants of health:  []  Z59.0 Homelessness  []  Z59.1 Inadequate housing  []  Z59.2 Discord with neighbors, lodgers and landlord  []  Z60.2 Problems related to living alone  []  Z59.8 Other problems related to housing and economic circumstances  []  Z59.4 Lack of adequate food and safe drinking water  []  Z59.6 Low income  []  Z59.7 Insufficient social insurance and welfare support  []  Z59.9 Problems related to housing and economic circumstances, unspecified  []  Z75.3 Unavailability and inaccessibility of health care facilities  []  Z75.4 Unavailability and inaccessibility of other helping agencies  []  Z72.4 Inappropriate diet and eating habits  []  Z62.820 Parent-biological child conflict  []  Z63.8 Other specified problems related to primary support group  []  Z55.0 Illiteracy and low level literacy   []  Z56.9 Unspecified problems related to employment        If Billing Based on Time:     I performed the following items on the day of service:    [x]  Preparing to see the patient (e.g., review of tests)  [x]  Obtaining and/or reviewing separately obtained history   [x]  Performing a medically appropriate examination and/or evaluation   [x]  Counseling and educating the patient/family/caregiver   [x]  Ordering medications, tests, or procedures  [x]  Referring and communicating with other healthcare professionals (when not separately reported)  [x]  Documenting clinical information in the EHR  []  Independently interpreting results and communicating results to patient/family/caregiver    I spent the following total amount of time on these tasks on the day of service:  New Patient     Established Patient  []  15-29 minutes - 99202    []  up to 9 minutes - 99211  []  30-44 minutes - 99203     []  10-19 minutes - 99212   []  45-59 minutes - 99204      []  20-29 minutes - 99213   []  60-74 minutes - 99205   [x]  30- minutes - 99214         []  40-55 minutes - 99215    []  I spent an additional ** * 15-minute-increment(s) for a total of * ** minutes on these tasks on the day of service. [phone] for each additional 15 minutes.)    Author: York Ram, MD 11/22/2021

## 2021-11-22 NOTE — Patient Instructions
Please consider getting a covid vaccine and an RSV vaccine in the fall.

## 2021-11-23 ENCOUNTER — Inpatient Hospital Stay: Payer: BLUE CROSS/BLUE SHIELD | Attending: Hematology & Oncology

## 2021-11-23 DIAGNOSIS — C50812 Malignant neoplasm of overlapping sites of left female breast: Secondary | ICD-10-CM

## 2021-11-23 DIAGNOSIS — Z170 Estrogen receptor positive status [ER+]: Secondary | ICD-10-CM

## 2021-11-23 DIAGNOSIS — C50811 Malignant neoplasm of overlapping sites of right female breast: Secondary | ICD-10-CM

## 2021-11-23 MED ADMIN — GADOBUTROL 1 MMOL/ML IV SOLN: 6.5 mL | INTRAVENOUS | @ 21:00:00 | Stop: 2021-11-23 | NDC 50419032511

## 2021-11-25 DIAGNOSIS — I5020 Unspecified systolic (congestive) heart failure: Secondary | ICD-10-CM

## 2021-12-07 ENCOUNTER — Telehealth: Payer: BLUE CROSS/BLUE SHIELD

## 2021-12-07 NOTE — Telephone Encounter
Called patient to schedule a new consult for dx: epistaxis.

## 2021-12-13 ENCOUNTER — Telehealth: Payer: BLUE CROSS/BLUE SHIELD

## 2021-12-13 NOTE — Telephone Encounter
Orders Request    What is being requested? (Tests, Labs, Imaging, etc.): Xray of the left knee, and head.     Reason for the request: Patient had a fall on 10/14, fell face down and has bruises to the left knee, complaining of pain, and slight swelling on left knee. Patient's home health care agency is requesting the Xray.     Where does the patient want to be seen? [facility]       If outside [place], what is the fax number to the facility? [phone] Attention to [place]      Has the patient seen their doctor for this matter? No    Last office visit: 08/15/2021      Patient or caller was offered an appointment but declined.    Patient or caller has been notified of the turnaround time of 1-2 business day(s).

## 2021-12-13 NOTE — Telephone Encounter
Call came though [employer].     [name] from [facility] services called in regards to pt. Stating that she fell on Saturday and has bruising  to head and Left knee. They wanted to have Dr. Jefm Bryant informed.     I advised I would route a message over.

## 2021-12-14 ENCOUNTER — Ambulatory Visit: Payer: BLUE CROSS/BLUE SHIELD

## 2021-12-14 DIAGNOSIS — R040 Epistaxis: Secondary | ICD-10-CM

## 2021-12-14 NOTE — Consults
Dear Dr. Brayton El,  I had the pleasure of evaluating [name] in our Head and Neck Surgery office today in consultation for epistaxis     History     HISTORY OF PRESENT ILLNESS: [name] is a 85 y.o. female who presents to [place] today with a history of intermittent epistaxis once per week, on average. Bleeding will typically occur from the right naris when the patient bends over. Last nosebleed was a week ago. During bleeding, the patient will place a rolled up tissue underneath her upper lip. After bleeding, patient will use Ayr brand saline nasal spray. No nasal sprays on a regular basis. Patient uses a humidifier at night. No issues prior to moving here from [name] (higher humidity). Takes aspirin 81 mg daily. No other blood thinners.    Past Medical History:   Diagnosis Date   ? Breast cancer (HCC/RAF) 12/20/2018        No past surgical history on file.    Outpatient Medications Prior to Visit   Medication Sig   ? Albuterol Sulfate AEPB Takes 1 to 2 puffs every 4 hours as needed for wheezing or SOB Inhale Takes 1 to 2 puffs every 4 hours as needed for wheezing or SOB ..   ? aspirin 81 mg EC tablet Take 1 tablet (81 mg total) by mouth daily.   ? azaTHIOprine 50 mg tablet Take one and one-half tablet (75 mg total) by mouth two (2) times daily.   ? clonazePAM 1 mg tablet 1 mg in pm.   ? DEXILANT 60 MG DR capsule TAKE 1 CAPSULE (60 MG TOTAL) BY MOUTH DAILY   ? DULoxetine 20 mg DR capsule Take 3 capsules (60 mg total) by mouth daily.   ? Evolocumab (REPATHA SURECLICK) 140 MG/ML SOAJ Inject 1 pen. under the skin every fourteen (14) days.   ? exemestane 25 mg tablet Take 1 tablet (25 mg total) by mouth daily.   ? fluticasone-vilanterol (BREO ELLIPTA) 100-25 mcg/inh inhaler Inhale 1 puff daily.   ? GAMUNEX-C 20 GM/200ML infusion    ? losartan 50 mg tablet Take 1 tablet (50 mg total) by mouth daily.   ? methenamine hippurate 1 g tablet Take 1 tablet (1 g total) by mouth two (2) times daily.   ? montelukast 10 mg tablet Take 1 tablet (10 mg total) by mouth as needed for.   ? PYRIDOSTIGMINE 60 mg tablet TAKE 1 TABLET (60 MG TOTAL) BY MOUTH THREE (3) TIMES DAILY AS NEEDED   ? TRIMETHOPRIM 100 mg tablet TAKE 1 TABLET (100 MG TOTAL) BY MOUTH DAILY   ? WELLBUTRIN SR 200 MG 12 hr tablet TAKE 1 TABLET (200 MG TOTAL) BY MOUTH TWO (2) TIMES DAILY     No facility-administered medications prior to visit.         Medication Allergies: Beta adrenergic blockers, Levofloxacin, Macrolides and ketolides, Sulfa antibiotics, Botulinum toxins, Statins, and Plastic tape    No family history on file.    SOCIAL HISTORY: Patient is retired, she  reports that she quit smoking about 38 years ago. Her smoking use included cigarettes. She has quit using smokeless tobacco. She reports that she does not currently use alcohol. She reports that she does not use drugs.    REVIEW OF SYSTEMS: The patient was asked and responded to a 14 point review of systems regarding constitutional symptoms, eye symptoms, ears, nose, mouth, throat symptoms, cardiovascular symptoms, respiratory symptoms, gastrointestinal symptoms, genitourinary symptoms, musculoskeletal symptoms, integumentary symptoms, neurological  symptoms, psychiatric symptoms, endocrine symptoms, hematologic/lymphatic symptoms, and allergic/immunologic symptoms.  The written questionnaire was reviewed.    The pertinent positive findings on ROS are the following: see attached scan  As it pertains to the head, eyes, ears, nose, and throat system, refer to the HPI  The patient has no other significant review of systems based findings.     Physical Examination   The patient underwent a physical examination as described below.      Constitutional: The patient's developmental and nutritional status was assessed.  The patient's voice quality was assessed.    Head and Face: The head and face were inspected for deformities.  The sinuses were palpated.  The salivary glands were palpated.  Facial strength was assessed bilaterally.    Eyes: Extraocular movements and primary gaze alignment were assessed.    Ears: The external ears were examined for deformities. Otoscopy was done to examine the external auditory canals and tympanic membranes.    Nose: The nose was examined for external deformities.  The nasal septum, mucosa, and turbinates were inspected by anterior rhinoscopy.    Oral cavity: The lips, teeth, and gums were examined for abnormalities.  The oral mucosa was inspected for lesions.  Oropharynx: The tongue, palate, tonsils, lateral and posterior pharynx were inspected for the presence of asymmetry or mucosal lesions.    Neck: The tracheal position was noted, and the neck was palpated to determine if there were any asymmetries, abnormal neck masses, thyromegaly, or thyroid nodules.    Respiratory: The nature of the breathing and chest expansion/symmetry was observed.    Cardiovascular: The patient was examined to determine the presence of any edema or jugular venous distension.    Abdomen: The contour of the abdomen was noted.    Lymphatic: The patient was examined for supraclavicular lymphadenopathy.    Musculoskeletal: The patient was inspected for the presence of skeletal deformities.    Extremities: The extremities were examined for any clubbing or cyanosis.    Skin: The skin of the head and neck was examined for inflammatory or neoplastic conditions.    Neurologic: The patient's orientation, mood, and affect were noted.  The cranial nerve  functions were examined.      Pertinent positive and negative findings on physical examination:   Nose: see procedure note for exam findings  All other physical examination findings were within normal limits or noncontributory.      Procedures     PROCEDURE  Nasal Endoscopy    Preoperative Diagnosis: right epistaxis    Postoperative Diagnosis: same as above; prominent blood vessels along right IT and Kiesselbach's plexus    Details of Procedure: The patient was seated in the examination chair.?  A flexible fiberoptic laryngoscope was then used to examine the left nasal cavity.? The nasal mucosa was first examined.  The inferior, middle and superior turbinates were examined, looking for evidence of inflammation or erythema.? Attention was then directed to the area of middle meatus where the endoscope was brought beneath the middle turbinate. The hiatus was examined, as was the natural maxillary ostium, as well as the visualized portion of the frontal sinus outflow tract. The sphenoethmoid recess and the superior meatus were examined. Attention was then directed to the septum where the area of the maxillary crest was examined for septal spurs as well as the deviation of the septum, both bony and cartilaginous.? The scope was withdrawn and then the right side of the nose was investigated.  The nasal mucosa was first  examined.  The inferior, middle and superior turbinates were examined, looking for evidence of inflammation or erythema.? Attention was then directed to the area of middle meatus where the endoscope was brought beneath the middle turbinate. The hiatus was examined, as was the natural maxillary ostium, as well as the visualized portion of the frontal sinus outflow tract. The sphenoethmoid recess and the superior meatus were examined.    Findings: Septum relatively midline. Prominent blood vessel on head of right inferior turbinate and right Kiesselbach's plexus. No mucopurulence or polyps in either nasal cavity.    Complications: None     Impression & Plan   IMPRESSION: [name] is a 86 y.o. female with intermittent epistaxis occurring once per week, on average, mostly from the right naris. Nasal endoscopy revealed prominent blood vessels on head of right inferior turbinate and right Kiesselbach's plexus. Patient deferred cautery at this time, as nosebleeds are manageable with saline nasal spray.   RECOMMENDATIONS:   -If epistaxis persists, worsens, or becomes more bothersome, return for chemical cautery.  -Try using Ayr nasal spray on a regular basis to maintain mucosa hydration and prevent dryness/bleeding.   -Warning signs or red flag symptoms that would warrant sooner follow-up discussed.  RETURN VISIT: as needed    Thank you so much for this consultation. Please do not hesitate to send me a message or page to discuss this patient or if you have any concerns/questions.  Attestation      SCRIBE SIGNATURE(S):  I, [name], [name] scribing for, and in the presence of, [name], Kaitlyn Apple., [name] for the documentation of [name].    PHYSICIAN SIGNATURE(S):  Kaitlyn Marry., [name]  12/14/2021 8:18 [name]    I have reviewed this note, written by [name] and attest that it is an accurate representation of my H & P and other events of the outpatient visit except if otherwise noted.     Please excuse typographical errors that have occurred in my typing or in scribe transcription    Kaitlyn Needle A. Cleotis Nipper, [name]  Health Sciences Associate Clinical Professor  Department of Head & Neck Surgery at [facility], Laryngology, Voice/Airway/Swallowing Disorders    Appointments:   [facility] Office: [phone]  [facility] Office: [phone]  Surgical Scheduling: Kaitlyn Client: [phone]    12/14/2021  1:30 PM

## 2021-12-22 ENCOUNTER — Ambulatory Visit: Payer: BLUE CROSS/BLUE SHIELD

## 2022-01-03 ENCOUNTER — Telehealth: Payer: BLUE CROSS/BLUE SHIELD

## 2022-01-03 ENCOUNTER — Ambulatory Visit: Payer: BLUE CROSS/BLUE SHIELD | Attending: Geriatric Medicine

## 2022-01-03 DIAGNOSIS — N1831 Stage 3a chronic kidney disease (HCC/RAF): Secondary | ICD-10-CM

## 2022-01-03 NOTE — Progress Notes
OUTPATIENT GERIATRICS CLINIC NOTE    PATIENT:  [name]   MRN:  [id number]  DOB:  [birth year]  DATE OF SERVICE:  01/03/2022  PRIMARY CARE PHYSICIAN: York Ram., MD    CHIEF COMPLAINT:   No chief complaint on file.      [facility]:  [employer]    [facility] Specialists:    Chaperone status:  No data recorded    HISTORY OF PRESENT ILLNESS     Christen Wardrop is a 86 y.o. female who presents today for *follow up**. Patient is accompanied by *no one**. History today is per the patient,and review of available recent records in [facility] and Care Everywhere.     Patient is a(n) [x]  reliable []  unreliable historian and additional collateral information was obtained during the visit today from:       On exemestane.  Agrees with her.  .          Per chart review, pertinent medical history:  Past Medical History:   Diagnosis Date   ? Breast cancer (HCC/RAF) 12/20/2018     No past surgical history on file.    ALLERGIES     Allergies   Allergen Reactions   ? Beta Adrenergic Blockers Other (See Comments)     Avoid d/t Myasthenia Gravis  Avoid d/t Myasthenia Gravis     ? Levofloxacin Other (See Comments)     Flare of Myasthenia Gravis   ? Macrolides And Ketolides Other (See Comments)     Avoid d/t Myasthenia Gravis  Use with caution d/t Myasthenia Gravis  Use with caution d/t Myasthenia Gravis     ? Sulfa Antibiotics Other (See Comments)     May possibly have caused deafness in one ear  May possibly have caused deafness in one ear  other     ? Botulinum Toxins Other (See Comments)     Muscle weakness r/t Myasthenia Gravis  Muscle weakness r/t Myasthenia Gravis     ? Statins Other (See Comments)     Muscle aches  Muscle aches  Muscle aches     ? Plastic Tape Itching and Other (See Comments)     Turns the skin red where applied        MEDICATIONS     Personally reviewed.    No outpatient medications have been marked as taking for the 01/03/22 encounter (Appointment) with York Ram., MD.       SOCIAL HISTORY Social History     Social History Narrative   ? Not on file        FUNCTIONAL STATUS     BADLs:  IADLs:    Ambulates with   Falls in past year:  Afraid of falling:    GERIATRIC REVIEW OF SYSTEMS     Vision:    []   glasses     []  legally blind  Hearing: []  hearing aids    Nutrition: []  normal   []  impaired  []  vegan  []  vegetarian  []  low salt  []  low-carb   Swallowing: []  impaired   Dentures:   []  yes    Depression:  []  yes   Cognition:     Incontinence: [] urine  []  fecal  []  urine and fecal      ADVANCED CARE PLANNING     Advanced directives on file: []  No  []   Yes ? Completed:     Medical DPOA on file:   []   Yes ?   []   No ?  Patient designates ** * to be their surrogate medical decision-maker.         HEALTH CARE MAINTENANCE     IMMUNIZATIONS:   Immunization History   Administered Date(s) Administered   ? COVID-19, mRNA, (Pfizer - Purple Cap) 30 mcg/0.3 mL 03/11/2019, 04/01/2019, 10/29/2019   ? COVID-19, mRNA, bivalent (Pfizer) 30 mcg/0.3 mL (12y and up) 11/25/2020, 07/05/2021   ? COVID-19, mRNA, tris-sucrose (Pfizer - [employer]) 30 mcg/0.3 mL 06/20/2020   ? DTaP 12/31/2015   ? influenza vaccine IM quadrivalent (Fluzone Quad) MDV (60 months of age and older) 11/26/2016   ? influenza vaccine IM quadrivalent adjuvanted (FluAD Quad) (PF) SYR (50 years of age and older) 11/05/2018, 12/01/2019, 11/25/2020   ? influenza vaccine IM quadrivalent high dose (Fluzone High Dose Quad) (PF) SYR (9 years of age and older) 11/22/2021   ? influenza, unspecified formulation 05/10/2014, 12/29/2015, 11/26/2016, 04/01/2019, 04/01/2019, 12/01/2019, 12/01/2019, 12/01/2019, 12/01/2019, 11/25/2020, 11/25/2020   ? pneumococcal conjugate vaccine 20-valent (Prevnar 20) 07/05/2021   ? pneumococcal polysaccharide vaccine 23-valent (Pneumovax) 01/10/2019       Mammogram:  DXA:  PAP:  Colonoscopy:      PHYSICAL EXAM     BP 130/77  ~ Pulse 90  ~ Temp (!) 35.8 ?C (96.4 ?F) (Temporal)  ~ Resp 16  ~ Ht 5' 5'' (1.651 m)  ~ Wt 135 lb 4.8 oz (61.4 kg)  ~ SpO2 96%  ~ BMI 22.52 kg/m?   Wt Readings from Last 3 Encounters:   01/03/22 135 lb 4.8 oz (61.4 kg)   11/22/21 135 lb (61.2 kg)   11/14/21 134 lb 6.4 oz (61 kg)         System Check if normal Positive or additional negative findings   GEN  [x]  NAD     Eyes  []  Conj/Lids []  Pupils  []  Fundi   []  Sclerae []  EOM     ENT  []  External ears   []  Otoscopy   []  Gross Hearing    []   External nose   []  Nasal mucosa   []  Lips/teeth/gums    []  Oropharynx    []  Mucus membranes      Neck  []  Inspection/palpation    []  Thyroid     Resp  []  Effort    []  Auscultation       CV  []  Rhythm/rate   []  Murmurs   []  Edema   []  JVP non-elevated    Normal pulses:   []  Radial []  Femoral  []  Pedal     Breast  []  Inspection []  Palpation     GI  []  Bowel sounds    []  Nontender   []  No distension    []  No rebound or guarding   []  No masses   []  Liver/spleen    []  Rectal     GU  F:  []  External []  vaginal wall         []  Cervix     []  mucus        []  Uterus    []  Adnexa   M:  []  Scrotum []  Penis         []  Prostate     Lymph  []  Cervical []  supraclavicular     []  Axillae   []  Groin/inguinal     MSK []  Gait  []  Back     Specify site examined:    []  Inspect/palp []  ROM   []  Stability []  Strength/tone []  Used arms to push up  from seated to standing position   Assistive device:  []  single point cane []  quad cane  []  FWW []  rollator walker      Skin  []  Inspection []  Palpation     Neuro  []  Alert and oriented     []  CN2-12 intact grossly   []  DTR      []  Muscle strength      []  Sensation   []  Pronator drift   []  Finger to Nose/Heel to Shin   []  Romberg     Psych  []  Insight/judgement     []  Mood/affect    []  Gross cognition            LABS/STUDIES     LABS:  Lab Results   Component Value Date    WBC 4.7 10/18/2021    WBC 4.94 09/01/2021    WBC 6.14 03/16/2021    HGB 12.7 10/18/2021    HGB 11.8 09/01/2021    HGB 12.4 03/16/2021    MCV 103.0 (H) 10/18/2021    PLT 379 (H) 10/18/2021    PLT 342 09/01/2021    PLT 434 (H) 03/16/2021     Lab Results Component Value Date    NA 137 10/18/2021    NA 139 09/20/2021    NA 136 09/01/2021    K 4.59 10/18/2021    K 4.5 09/20/2021    K 4.1 09/01/2021    CREAT 1.6 (H) 10/18/2021    CREAT 1.86 (H) 09/20/2021    CREAT 1.67 (H) 09/01/2021    GFRESTNOAA 29 06/09/2020    GFRESTNOAA 28 05/18/2020    GFRESTNOAA 30 03/23/2020    GFRESTAA 34 06/09/2020    GFRESTAA 33 05/18/2020    GFRESTAA 35 03/23/2020    CALCIUM 9.7 10/18/2021    CALCIUM 9.6 09/20/2021    CALCIUM 9.9 09/01/2021     Lab Results   Component Value Date    ALT 6 (L) 10/18/2021    ALT 10 09/01/2021    AST 16 10/18/2021    AST 30 09/01/2021    ALKPHOS 54 10/18/2021    BILITOT 0.30 10/18/2021    ALBUMIN 3.8 10/18/2021    ALBUMIN 4.0 09/01/2021     Lab Results   Component Value Date    TSH 2.7 09/01/2021    TSH 1.5 03/16/2021    TSH 2.8 03/23/2020     Lab Results   Component Value Date    HGBA1C 5.2 09/01/2021    HGBA1C 5.1 03/16/2021    HGBA1C 5.2 03/23/2020     Lab Results   Component Value Date    CHOL 237 09/01/2021    CHOL 199 06/03/2021     Lab Results   Component Value Date    CHOLDLQ 147 (H) 09/01/2021    CHOLDLQ 128 (H) 06/03/2021     Lab Results   Component Value Date    VITD25OH 76 10/18/2021     Lab Results   Component Value Date    FE 87 07/14/2019    FERRITIN 57 07/14/2019    FOLATE 9.6 11/25/2019    TIBC 356 07/14/2019     Lab Results   Component Value Date    VITAMINB12 3,994 (H) 11/25/2019    VITAMINB12 217 (L) 09/22/2019     Lab Results   Component Value Date    BNP 156 (H) 06/03/2021    BNP 92 09/22/2019     No results found for: ''PSATOTAL''      STUDIES:  EKG  This data element was independently reviewed and interpreted by me     NSR NO ST or T changes    XR CHEST PA LAT 2V  September 22, 2019?  COMPARISON: [facility] December 17, 2018  ?  History: sob  ?  FINDINGS:  ?  Lungs: Clear  Heart/aorta: Normal heart size. The thoracic aorta is mildly calcified, tortuous and ectatic.  Adenopathy: None  Pleura: No effusion  Bones and Chest wall: No acute bony or body wall  findings. Osteopenia and bony maturational changes. Right upper quadrant cholecystectomy clips stable from October 2020  ?  ?  IMPRESSION:  ?  No acute findings or abnormalities related to provided history.      MRI C spine  May 03, 2021  IMPRESSION:  Redemonstrated congenital cervical spinal canal stenosis with superimposed multilevel degenerative changes, which are mildly progressive compared to prior as above. No evidence of cord compression or cord signal abnormality.      Echo  Jan 2023  1. Normal left ventricular size.  2. There are LV regional wall motion abnormalities.  3. Left ventricular ejection fraction is approximately 35 to 40%.  4. Abnormal LV diastolic function.  5. There are no prior studies on this patient for comparison purposes.  ASSESSMENT and PLAN     Karolyne Timmons is a 86 y.o. female who presents today for *follow up**.    #Major Depression--active--self d/ced duloxetine.  Rechallenge.  Follow up in 6 weeks to assess tolerance and efficacy.      #HFrEF--EF 35-40%noted on echo-- 2023--Not Worsening.  Chronic.  Will monitor and treat underlying risk factors with medical and lifestyle interventions as warranted by balance between benefits and burdens.   #CAD--discussed medical management alone with shared decision making model May 11, 2021.  Declines staint  #Cervical myelopathy--monitored by neuro--chronic--re-imaged on MRI as above.  CTM.  #HTN--Increased nifedipine CR 60 mg daily. Continue losartan 50 mg.  BP Better.    #CKD--stage 4--chronic--CTM--control BP as tolerated by symptoms of orthostasis and fatigue  #CAD--patient leaning towards medical management.  #Aortic Calcification--noted on chest imaging     September 22, 2019?       --Not Worsening.  Chronic.  Will monitor and treat underlying risk factors with medical and lifestyle interventions as warranted by balance between benefits and burdens.  #allergic rhinitis--trial flonase  #Macrocytosis--  #Deconditioning--initiate home PT  #GERD--s/p Nisan fundoplication  #COPD--mixed--per PFT's from [name].--albuterol prn.  May resume other inhalders.  #s/p rectocele repair  #s/p cholecystectomy  #Immunosupressed status--medication related.  Chronic. CTM  #Nocturnal hypoxemia--presumably related to MG crisis but has history of COPD. ?Will order nocturnal home O2 study and consider PFT's/Pulmonary evaluation once settled down. ?May need to restart BREO inhaler.  #Myasthenia Gravis--s/p plasmapheresis x5 October 2020, no Thymoma, on Azathioprine 150 daily and mestinon 60 tid.??Saw?Dr. Enedina Finner at [facility]. ?Was considering Soliris.??Ordering IVIG and MRI.  Encourage ongoing neurology follow up.   #IBS--titrate miralax to comfort. ?  #Interstitial cystitis--on daily Trimethoprim 100 for UTI prophylaxis. ?Using bladder instillations prn. OFF topical estrogen due to advice from neurologist re: MG.  To see Dr. Lorenz Coaster.  Suspect recent flare related to breast CA re-diagnosis.  Doubt pseudomonas bactiuria is pathogen at present.  WIll complete current meropenum.    #Allergic rhinitis--previously on nasal steroids.  #Breast CA--under care.  Patient continues to decline surgery.  Wondering about additional treatment options.  Will direct to med-onc for discussion.  #DM with CKD--stabe 3b--based on labs from [name]. ?  FOllow here. ?If BP permits, may consider ARB BUT patient with h/o angioedema so will ONLY initiate this agent with great caution.  Refer to Ophthalmology.  #OA knees--previously getting hyaluronic acid injections.  #Anxiety  #Benzodiazepine Dependence--uncomplicated--chronic.  Encourage patient to decrease intake--ideally to abstaining but will work towards this goal over time.  Not likely to achieve this given ongoing concerns re: breast ca.  #Fibromyalgia--duloxetine as above.   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  #Statin intolerance      FOLLOW-UP     RTC     Future Appointments   Date Time Provider Department Center 01/03/2022 11:40 AM York Ram., MD GERI IMS [address] [place]/Cen   01/08/2022 11:45 AM Clelia Croft, MD CRD DIS [address] [facility]   01/11/2022  1:20 PM Fredda Hammed., MD HEM/ONC [facility]   01/26/2022  2:30 PM MP2 MAM PANEL01 (HOSP) MAM MP2 [place]/Cen   02/13/2022 11:30 AM [facility]U [facility] NEUMUSC B200 [place]/Cen         The above plan of care, diagnosis, orders, and follow-up were discussed with the patient and/or surrogate. Questions related to this recommended plan of care were answered.    ANALYSIS OF DATA (Needs to meet 1 category for moderate and 2 categories for high LOS)     I have:     Category 1 (Needs 3 for moderate and high LOS)     []  Reviewed []  1 []  2 []  ? 3 unique laboratory, radiology, and/or diagnostic tests noted below    Test/Study:  on date .    []  Reviewed []  1 []  2 []  ? 3 prior external notes and incorporated into patient assessment    I reviewed Dr. 's note in specialty  from date .    []  Discussed management or test interpretation with external provider(s) as noted      []  Ordered []  1 []  2 []  ? 3 unique laboratory, radiology, and/or diagnostic tests noted in A&P    []  Obtained history from independent historian:       Category 2  []  Independently interpreted the test    Category 3  []  Discussed management or test interpretation with external provider(s) as noted      INTERACTION COMPLEXITY and SOCIAL DETERMINANTS of HEALTH       PROBLEM COMPLEXITY   []  New problem with uncertain diagnosis or prognosis (moderate)   []  Multiple stable chronic problems (moderate)   []  Chronic problem not stable - not controlled, symptomatic, or worsening (moderate)   []  Severe exacerbation of chronic problem (high)   []  New or chronic problem that poses threat to life or bodily function (high)     MANAGEMENT COMPLEXITY   []  Old or external/outside records reviewed   []  Discussion with alternate (proxy) if patient with impaired communication / comprehension ability (e.g., dementia, aphasia, severe hearing loss).   []  Repeated questions (or disagreement) between patient and/among caregivers/family during the visit.  []  Caregiver/patient emotions/behavior/beliefs interfering with implementation of treatment plan.  []  Independent interpretation of test (EKG, Chest XRay)   []  Discussion of case with a consultant physician     RISK LEVEL   []  Prescription drug management (moderate)   []  Minor surgery with CV risk factors or elective major surgery (moderate)   []  Dx or Rx significantly limited by SDoH (inadequate housing, living alone, poor health care access, inappropriate diet; low literacy) (moderate)   []  Major surgery - elective with CV risk factors or emergent (high)   []   Need for hospitalization (high)   []  New DNR or de-escalation of care (high)      SDoH  The diagnosis or treatment of said conditions is significantly limited by the following social determinants of health:  []  Z59.0 Homelessness  []  Z59.1 Inadequate housing  []  Z59.2 Discord with neighbors, lodgers and landlord  []  Z60.2 Problems related to living alone  []  Z59.8 Other problems related to housing and economic circumstances  []  Z59.4 Lack of adequate food and safe drinking water  []  Z59.6 Low income  []  Z59.7 Insufficient social insurance and welfare support  []  Z59.9 Problems related to housing and economic circumstances, unspecified  []  Z75.3 Unavailability and inaccessibility of health care facilities  []  Z75.4 Unavailability and inaccessibility of other helping agencies  []  Z72.4 Inappropriate diet and eating habits  []  Z62.820 Parent-biological child conflict  []  Z63.8 Other specified problems related to primary support group  []  Z55.0 Illiteracy and low level literacy   []  Z56.9 Unspecified problems related to employment        If Billing Based on Time:     I performed the following items on the day of service:    [x]  Preparing to see the patient (e.g., review of tests)  [x]  Obtaining and/or reviewing separately obtained history [x]  Performing a medically appropriate examination and/or evaluation   [x]  Counseling and educating the patient/family/caregiver   [x]  Ordering medications, tests, or procedures  [x]  Referring and communicating with other healthcare professionals (when not separately reported)  [x]  Documenting clinical information in the EHR  []  Independently interpreting results and communicating results to patient/family/caregiver    I spent the following total amount of time on these tasks on the day of service:  New Patient     Established Patient  []  15-29 minutes - 99202    []  up to 9 minutes - 99211  []  30-44 minutes - 99203     []  10-19 minutes - 99212   []  45-59 minutes - 99204      []  20-29 minutes - 99213   []  60-74 minutes - 99205   [x]  30- minutes - 99214         []  40-55 minutes - 99215    []  I spent an additional ** * 15-minute-increment(s) for a total of * ** minutes on these tasks on the day of service. [phone] for each additional 15 minutes.)    Author: York Ram, MD 01/03/2022

## 2022-01-08 ENCOUNTER — Ambulatory Visit: Payer: BLUE CROSS/BLUE SHIELD

## 2022-01-08 DIAGNOSIS — I5022 Chronic systolic (congestive) heart failure: Secondary | ICD-10-CM

## 2022-01-08 DIAGNOSIS — I10 Essential (primary) hypertension: Secondary | ICD-10-CM

## 2022-01-08 NOTE — Consults
CARDIOLOGY NOTE      PATIENT: [name]   MRN: [id number]   DOB: November 24, 1935   DATE OF SERVICE: 01/08/2022    PCP:  York Ram., MD      Problem List Items Addressed This Visit     Aortic calcification (HCC/RAF) - Primary   Other Visit Diagnoses     Essential hypertension        Systolic CHF, chronic (HCC/RAF)                Interval History:     The patient feels SOB/dyspnea  She last used Repatha about 3-4 weeks       Prior Encounter:     This is a 86 y.o. female who is here for cardiac evaluation.  Indication:   SOB    The patient is a/w friend, Jacobo Forest    Pt was recently hospitalized with SOB and chest pain.   Pt ambulates with a walker for precautionary reasons given history of falls.   With activity, the patient reports SOB/dypnea. She walks the dog 1/2 block  Her symptoms have been ongoing for the past 3 years, progressive worsening.   The patient notes allergies and asthma.   About 1 year ago, she could walk 1.5 blocks         Past Medical History:   Diagnosis Date   ? Breast cancer (HCC/RAF) 12/20/2018       No past surgical history on file.    Allergies:   Allergies   Allergen Reactions   ? Beta Adrenergic Blockers Other (See Comments)     Avoid d/t Myasthenia Gravis  Avoid d/t Myasthenia Gravis     ? Levofloxacin Other (See Comments)     Flare of Myasthenia Gravis   ? Macrolides And Ketolides Other (See Comments)     Avoid d/t Myasthenia Gravis  Use with caution d/t Myasthenia Gravis  Use with caution d/t Myasthenia Gravis     ? Sulfa Antibiotics Other (See Comments)     May possibly have caused deafness in one ear  May possibly have caused deafness in one ear  other     ? Botulinum Toxins Other (See Comments)     Muscle weakness r/t Myasthenia Gravis  Muscle weakness r/t Myasthenia Gravis     ? Statins Other (See Comments)     Muscle aches  Muscle aches  Muscle aches     ? Plastic Tape Itching and Other (See Comments)     Turns the skin red where applied       Social History     Socioeconomic History   ? Marital status: Widowed   Tobacco Use   ? Smoking status: Former     Types: Cigarettes     Quit date: 1985     Years since quitting: 38.8   ? Smokeless tobacco: Former   Substance and Sexual Activity   ? Alcohol use: Not Currently   ? Drug use: Never       No family history on file.  Adopted     Objective:     BP 133/68  ~ Pulse 82  ~ Wt 137 lb 3.2 oz (62.2 kg)  ~ SpO2 98%  ~ BMI 22.83 kg/m?     Vitals:    01/08/22 1201   BP: 133/68   Pulse: 82   SpO2: 98%   Weight: 137 lb 3.2 oz (62.2 kg)       Body mass index is 22.83 kg/m?Marland Kitchen  Wt Readings from Last 3 Encounters:   01/08/22 137 lb 3.2 oz (62.2 kg)   01/03/22 135 lb 4.8 oz (61.4 kg)   11/22/21 135 lb (61.2 kg)          General Exam: no apparent distress.    Neurologic/Psychiatric: alert and oriented. Affect is appropriate.  HEENT: Sclerae are anicteric. There is no epistaxis.  Cardiac: regular in rate and rhythm.    Respiratory: non-labored breathing. No use of accessory muscles. No wheeze.  Abdomen: bowel sounds are present. Abdomen is soft. No rebound.    Extremities: no edema. No cyanosis or clubbing.    Integument: no petechiae. No acute rash.         Laboratory:  Lab Results   Component Value Date    NA 139 01/03/2022    K 4.6 01/07/2022    CL 102 01/03/2022    CO2 23 01/03/2022    BUN 28 (H) 01/03/2022    CREAT 1.70 (H) 01/07/2022    GLUCOSE 81 01/03/2022    CALCIUM 10.1 01/03/2022     Lab Results   Component Value Date    CKTOT 54 01/07/2022    CKTOT 50 09/01/2021    TROPONIN 0.05 (H) 12/19/2020    TROPONIN 0.07 (H) 12/19/2020    TROPONIN 0.06 (H) 12/18/2020     Lab Results   Component Value Date    BNP 156 (H) 06/03/2021     Lab Results   Component Value Date    WBC 5.37 01/03/2022    HGB 12.8 01/03/2022    HCT 39.4 01/03/2022    MCV 99.7 (H) 01/03/2022    PLT 339 01/03/2022     Lab Results   Component Value Date    CHOL 226 01/07/2022    CHOLHDL 70 01/07/2022    CHOLDLQ 144 (H) 01/07/2022    TRIGLY 68 01/07/2022     Lab Results   Component Value Date    PT 12.9 12/18/2020    INR 1.0 12/18/2020     No components found for: ''HA1C''  Lab Results   Component Value Date    TSH 2.3 01/03/2022       Patient Active Problem List   Diagnosis   ? Acute medial meniscal tear, left, subsequent encounter   ? Angioedema   ? Anxiety   ? Asthmatic bronchitis, mild persistent, with acute exacerbation   ? Chronic cholecystitis   ? Chronic interstitial cystitis   ? CKD (chronic kidney disease), stage III (HCC/RAF)   ? COPD mixed type (HCC/RAF)   ? Major depression, recurrent, chronic (HCC/RAF)   ? Diabetes mellitus (HCC/RAF)   ? Esophageal reflux   ? Eustachian tube dysfunction   ? Fibromyalgia   ? H/O arthroscopy   ? Insomnia   ? Myasthenia gravis with exacerbation (HCC/RAF)   ? Nocturnal hypoxemia   ? Osteoarthritis of knee   ? Renal cyst   ? Seasonal and perennial allergic rhinitis   ? Spondylosis without myelopathy or radiculopathy, lumbar region   ? Urethral caruncle   ? Vaginal atrophy   ? Benzodiazepine dependence (HCC/RAF)   ? Breast cancer (HCC/RAF)   ? Refractive error   ? Ptosis of eyelid   ? Pseudophakia of both eyes   ? Aortic calcification (HCC/RAF)   ? HTN (hypertension)   ? Immunosuppressed status (HCC/RAF)   ? Acute chest pain   ? Atypical chest pain   ? Heart failure with reduced ejection fraction LVEF <=40% (HCC/RAF)  Outpatient Medications Prior to Visit   Medication Sig   ? Albuterol Sulfate AEPB Takes 1 to 2 puffs every 4 hours as needed for wheezing or SOB Inhale Takes 1 to 2 puffs every 4 hours as needed for wheezing or SOB ..   ? aspirin 81 mg EC tablet Take 1 tablet (81 mg total) by mouth daily.   ? azaTHIOprine 50 mg tablet Take one and one-half tablet (75 mg total) by mouth two (2) times daily.   ? clonazePAM 1 mg tablet 1 mg in pm.   ? DEXILANT 60 MG DR capsule TAKE 1 CAPSULE (60 MG TOTAL) BY MOUTH DAILY   ? DULoxetine 20 mg DR capsule Take 3 capsules (60 mg total) by mouth daily.   ? Evolocumab (REPATHA SURECLICK) 140 MG/ML SOAJ Inject 1 pen. under the skin every fourteen (14) days.   ? exemestane 25 mg tablet Take 1 tablet (25 mg total) by mouth daily.   ? fluticasone-vilanterol (BREO ELLIPTA) 100-25 mcg/inh inhaler Inhale 1 puff daily.   ? GAMUNEX-C 20 GM/200ML infusion    ? losartan 50 mg tablet Take 1 tablet (50 mg total) by mouth daily.   ? methenamine hippurate 1 g tablet Take 1 tablet (1 g total) by mouth two (2) times daily.   ? montelukast 10 mg tablet Take 1 tablet (10 mg total) by mouth as needed for.   ? PYRIDOSTIGMINE 60 mg tablet TAKE 1 TABLET (60 MG TOTAL) BY MOUTH THREE (3) TIMES DAILY AS NEEDED   ? TRIMETHOPRIM 100 mg tablet TAKE 1 TABLET (100 MG TOTAL) BY MOUTH DAILY   ? WELLBUTRIN SR 200 MG 12 hr tablet TAKE 1 TABLET (200 MG TOTAL) BY MOUTH TWO (2) TIMES DAILY     No facility-administered medications prior to visit.       Current Outpatient Medications   Medication Sig   ? Albuterol Sulfate AEPB Takes 1 to 2 puffs every 4 hours as needed for wheezing or SOB Inhale Takes 1 to 2 puffs every 4 hours as needed for wheezing or SOB ..   ? aspirin 81 mg EC tablet Take 1 tablet (81 mg total) by mouth daily.   ? azaTHIOprine 50 mg tablet Take one and one-half tablet (75 mg total) by mouth two (2) times daily.   ? clonazePAM 1 mg tablet 1 mg in pm.   ? DEXILANT 60 MG DR capsule TAKE 1 CAPSULE (60 MG TOTAL) BY MOUTH DAILY   ? DULoxetine 20 mg DR capsule Take 3 capsules (60 mg total) by mouth daily.   ? Evolocumab (REPATHA SURECLICK) 140 MG/ML SOAJ Inject 1 pen. under the skin every fourteen (14) days.   ? exemestane 25 mg tablet Take 1 tablet (25 mg total) by mouth daily.   ? fluticasone-vilanterol (BREO ELLIPTA) 100-25 mcg/inh inhaler Inhale 1 puff daily.   ? GAMUNEX-C 20 GM/200ML infusion    ? losartan 50 mg tablet Take 1 tablet (50 mg total) by mouth daily.   ? methenamine hippurate 1 g tablet Take 1 tablet (1 g total) by mouth two (2) times daily.   ? montelukast 10 mg tablet Take 1 tablet (10 mg total) by mouth as needed for.   ? PYRIDOSTIGMINE 60 mg tablet TAKE 1 TABLET (60 MG TOTAL) BY MOUTH THREE (3) TIMES DAILY AS NEEDED   ? TRIMETHOPRIM 100 mg tablet TAKE 1 TABLET (100 MG TOTAL) BY MOUTH DAILY   ? WELLBUTRIN SR 200 MG 12 hr tablet TAKE 1 TABLET (200  MG TOTAL) BY MOUTH TWO (2) TIMES DAILY     No current facility-administered medications for this visit.             2018 ACC/AHA guidelines recommends high-intensity statin because of ASCVD diagnosis. Patient is intolerant to statin. Consider non-statin Rx or referral.  10-Year ASCVD risk N/A. Age > 75. as of 12:18 PM on 01/08/2022.  10-Year ASCVD risk with optimal risk factors cannot be calculated.  Values used to calculate ASCVD score:  Age: 86 y.o. Cannot calculate risk because age is not between 20 and 23 years old.  Gender: Female Race: White  70 mg/dL. (measured on 01/07/2022)  226 mg/dL. (measured on 01/07/2022)  144 mg/dL. (measured on 01/07/2022)  133 mm Hg. (measured on 01/08/2022)  Yes  currently not a smoker  Yes  Click here for the [facility] ASCVD Cardiovascular Risk Estimator Plus tool (online calculator).      Assessment:     Jaydalyn Demattia is 86 y.o. female with    Cardiomyopathy, EF 35-40%. Echo in 2023    - avoiding BBlockers due to myasthenia gravis    Abnormal Myoview in 2023: There is a moderate-severe perfusion defect in the distal anteroseptal wall and apex, suggestive for MI.  SOB/dyspnea. Uses oxygen concentrator  Abnormal EKG with LBBB and PACs  Former tobacco use  Subtle calcifications of the aortic root. Moderately calcified thoracic aorta and its main branch vessels.  Intolerant to Statins    Plan:     Losartan 50 mg  ASA    Repatha - discussed compliance   If amenable, start Roderic Palau, [name], MD  Clelia Croft, MD  Hi [name].   She and I had a thoughtful discussion today. ?In light of her breast cancer and her Myasthenia Gravis, it seems like medical management of her CAD is best.         Orders Placed This Encounter   ? ECG 12-Lead Clinic Performed Return in about 4 weeks (around 02/05/2022).      There are no Patient Instructions on file for this visit.    Future Appointments   Date Time Provider Department Center   01/11/2022  1:20 PM Fredda Hammed., MD HEM/ONC [facility]   01/26/2022  2:30 PM MP2 MAM PANEL01 (HOSP) MAM MP2 [place]/Cen   02/13/2022 11:30 AM [facility]U [facility] NEUMUSC B200 [place]/Cen   03/13/2022  1:40 PM York Ram., MD GERI IMS [address] [place]/Cen         Author: Clelia Croft, MD

## 2022-01-09 NOTE — Progress Notes
HEMATOLOGY/ONCOLOGY OUTPATIENT NOTE    PATIENT: [name]  MRN: [id number]  DOB: 11/12/1935  DATE OF SERVICE: 01/11/2022  Referring MD: York Ram., MD    Reason for Consultation/ Chief Complaint:  Bilateral breast cancer    HPI   [name] is a 86 y.o. female with PMH including myasthenia gravis, asymmetric hearing loss, fibromyalgia, DM with CKD, major depression, interstitial cystitis, IBS, COPD who presents to establish care for breast cancer.     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 07/19/2017 Screening detected right breast calcifications and distortion in the upper outer quadrant: Biopsy DCIS+ ALH +CSL; 4 cm apart, axilla negative.    Neoadjuvant tamoxifen started 10/30/17, stopped after 4-6 weeks due to dry mouth and mouth sores. Switched to anastrozole 01/29/18, discontinued because it made her throat dry. Declined additional antiestrogen therapy.   Moved on October of 2020 to [place]   ?  Ms. Rotenberry then noted left breast pain in April 2022.     Underwent a diagnostic bilateral breast imaging 06/13/2020 which revealed:   ?- Right breast: solid mass with indistinct margins measuring 14 x 4 x 10 mm in the right breast at 1 o'clock located 6 centimeters from the nipple with associated microclip,  not parallel solid mass with irregular margins measuring 8 x 4 x 5 mm seen in the right breast at 12 o'clock, upper outer quadrant located 6 centimeters from the nipple.   ?- Left breast: irregular mass with angular margins measuring 10 x 9 x 9 mm seen in the left breast at 11 o'clock located 5 centimeters from the nipple, axillary lymph node in the left axilla with cortical thickness measuring up to 6 mm.    R breast biopsy at 12:00, 6 cm from nipple on 07/13/2020 revealed invasive ductal carcinoma with lobular and focal cribriform features, grade 2 ER (+) PR (+) HER2 (-), Ki-67 5%    L breast biopsy at 11:00, 5 cm from nipple on 07/13/2020 revealed invasive ductal carcinoma with lobular features, grade 2 ER (+) PR (-) HER2 (-), Ki-67 10%  ?  Breast MRI performed on 08/05/2020 that redemonstrated multifocal right breast malignancy and left breast IDC with an additional 4.3cm of NME that spanned nearly the entire left breast that appears suspicious.     [facility] genetic testing (-) for pathogenic variants.     08/17/2020 Patient presents to establish care for breast cancer. She has seen Dr. Janee Morn to discuss surgical treatment, and is quite worried about anesthesia with her myasthenia gravis . She is interested in neoadjuvant therapy     09/13/2020 Patient presents for follow-up of breast cancer. PETCT on 08/22/2020 with no evidence of FDG avid metastatic disease. DXA scan on 08/26/2020 demonstrated osteopenia of the left femoral neck and total hip.     10/28/2020 Patient presents for follow-up of breast cancer. Labs on 09/13/2020 showed WBC 5.3, hgb 11.4, platelets 300, ANC 3.58. Endorses fatigue, frequent diarrhea. BM anytime she eats, usually about 3 times a day. Diarrhea is more frequent in the morning. Also reports hot flashes. Breasts are tender, using a larger bra size.     11/25/2020 Patient presents for follow-up of breast cancer. Labs on 9/2/202 showed WBC 6.7, hgb 12.0, platelets 384,  ANC 4.80. Breast MRI on 11/18/2020 with stable to slightly decreased size of known biopsy proven malignancies in the right breast, decreased size of the irregular mass with microclip at 11:00, 5 cm  from the nipple, consistent with known malignancy, nonmass enhancement spanning 40 mm has also slightly decreased in size and now demonstrates subthreshold kinetics, this remains suspicious, previously described oval enhancing mass at 4:00 is no longer identified.     01/03/2021 Patient presents for follow-up of breast cancer. Labs on 12/20/2020 showed WBC 6.4, hgb 9.7, platelets 278, ANC 5.00.     Hospital admission 10/23-10/25/2022. P/w?atypical?chest pain x 2-3 days?with new LBBB but negative troponin and relief w/ antacid medications most c/f upper GI pathology such as severe GERD, PUD, or gastritis.?CT AP without acute abnormality. Initiated pantoprazole 40 mg PO bid with improvement in symptoms with PPI and GI cocktail w/maalox. Patient able to tolerate all meals without recurrent chest pain. GI was consulted and given improvement in symptoms, EGD deferred. Continue?Maalox q4h PRN. H pylori stool Ag discontinued given long term PPI use. Continue dexelant outpatient. Ondansetron?4 mg PO q6hr PRN Rx for nausea. Outpatient GI follow-up.     10/18/2021 Patient presents for follow-up of breast cancer. Labs on 09/01/2021 showed WBC 4.94, hgb 11.8, platelets 342. Breast MRI 05/03/2021 with Interval decrease in size of known malignancies in the right breast with trace residual enhancement, Grossly stable irregular mass at 11 o'clock corresponding to biopsy-proven malignancy,  Interval resolution of previously described regional nonmass enhancement in the upper inner, upper outer, and lower outer quadrants. She admits to persistent diarrhea associated with significant weight loss. She often does not eat during the day because she is fearful of leaving the house d/t diarrhea. She does not take much imodium. The diarrhea is dependent on what she eats. Has longstanding hx of IBS. Diarrhea is improved when she takes dexilant but was unable to obtain the dexilant in the preferred form for a while. She denies B symptoms. Admits to occasional vomiting associated with the diarrhea. Continues with IVIG for myasthenia gravis. Has decided she would not like to pursue breast surgery due to age, co morbidities, anaesthetic risks.     11/14/2021 Patient presents for follow-up of breast cancer. Labs on 10/18/2021 showed WBC 4.7, hgb 12.7, platelets 379, ANC 3.81, CEA 5.00. CT chest 11/13/2021 (-). Met with Dr. Lindajo Royal today. Held Arimidex from last visit until yesterday. Diarrhea resolved while she held the Anastrazole. She actually became constipated.     01/11/2022 Patient presents for follow up of breast cancer. MR abd/pelv 11/23/2021 (-). Labs on 01/03/2022 showed WBC 5.37, hgb 12.8, PLT 339. Switched to exemestane last visit       ECOG performance status: 0  PAST HISTORY   PMH:  Patient Active Problem List    Diagnosis Date Noted   ? Heart failure with reduced ejection fraction LVEF <=40% (HCC/RAF) 11/25/2021   ? Atypical chest pain 12/20/2020   ? Acute chest pain 12/18/2020   ? Immunosuppressed status (HCC/RAF) 03/21/2020   ? HTN (hypertension) 10/31/2019   ? Aortic calcification (HCC/RAF) 09/25/2019   ? Refractive error 04/28/2019   ? Ptosis of eyelid 04/28/2019   ? Pseudophakia of both eyes 04/28/2019   ? Benzodiazepine dependence (HCC/RAF) 12/20/2018   ? Breast cancer (HCC/RAF) 12/20/2018   ? Spondylosis without myelopathy or radiculopathy, lumbar region 11/28/2018   ? Anxiety 11/27/2018   ? CKD (chronic kidney disease), stage III (HCC/RAF) 11/27/2018   ? Nocturnal hypoxemia 04/07/2018     Last Assessment & Plan:   We discussed potential impact of hypoxemia from her COPD on other body function  Plan-overnight oximetry     ? Major depression, recurrent, chronic (HCC/RAF) 12/16/2017   ?  Diabetes mellitus (HCC/RAF) 12/16/2017   ? Osteoarthritis of knee 05/17/2017   ? H/O arthroscopy 03/06/2017   ? Myasthenia gravis with exacerbation (HCC/RAF) 12/18/2016   ? Vaginal atrophy 08/16/2016   ? Acute medial meniscal tear, left, subsequent encounter 07/18/2016   ? Renal cyst 03/12/2016   ? Asthmatic bronchitis, mild persistent, with acute exacerbation 12/29/2015     Last Assessment & Plan:   Continue rescue inhaler as needed. Try adding sample Bevespi maintenance inhaler to see if this helps.  She is currently on prednisone 20 mg daily maintenance and I don't think adding a steroid inhaler component will make any difference.     ? Urethral caruncle 07/26/2015   ? Chronic cholecystitis 01/14/2015   ? Angioedema 05/23/2014     Attributed to injected methylprednisolone    Last Assessment & Plan:   We discussed limited ability to test for possible triggers. We are asking our labs sources about ability to test against methylprednisolone for injection.  A different brand might be worth trying. She can try pre-medicating with an antihistamine.     ? Chronic interstitial cystitis 10/01/2011   ? Insomnia 05/31/2007     Qualifier: Diagnosis of   By: Maple Hudson MD, Clinton D    Last Assessment & Plan:   I'm concerned that some of her sleep problems may reflect obstructive sleep apnea based on her palate and which she can tell me about her sleep patterns without a witness. We also want to know about oxygenation during sleep.  Plan-schedule sleep study  Qualifier: Diagnosis of   By: Maple Hudson MD, Clinton D      Last Assessment & Plan:   I'm concerned that some of her sleep problems may reflect obstructive sleep apnea based on her palate and which she can tell me about her sleep patterns without a witness. We also want to know about oxygenation during sleep.  Plan-schedule sleep study     ? COPD mixed type (HCC/RAF) 04/30/2007     Office Spirometry 03/16/2016-severe obstructive airways disease FVC 1.59/59%, FEV1 0.98/49%, ratio 0.62, FEF 25-75% 0.42/28%  PFT 04/16/16-severe obstructive airways disease with slight response to bronchodilator, severe diffusion defect. FEV1/FVC 0.64, DLCO 45%    Last Assessment & Plan:   Severe obstructive airways disease. She can't hear herself wheeze.  Plan-try samples of Trelegy instead of Anoro for comparison    Overview:   Office Spirometry 03/16/2016-severe obstructive airways disease FVC 1.59/59%, FEV1 0.98/49%, ratio 0.62, FEF 25-75% 0.42/28%  PFT 04/16/16-severe obstructive airways disease with slight response to bronchodilator, severe diffusion defect. FEV1/FVC 0.64, DLCO 45%    Last Assessment & Plan:   Severe obstructive airways disease. She can't hear herself wheeze.  Plan-try samples of Trelegy instead of Anoro for comparison  Office Spirometry 03/16/2016-severe obstructive airways disease FVC 1.59/59%, FEV1 0.98/49%, ratio 0.62, FEF 25-75% 0.42/28%  PFT 04/16/16-severe obstructive airways disease with slight response to bronchodilator, severe diffusion defect. FEV1/FVC 0.64, DLCO 45%    Last Assessment & Plan:   Severe COPD but mainly emphysema with limited bronchospasm component that could be addressed with bronchodilators.  Plan-try sample Bevespi as a maintenance controller, refill pro-air     ? Esophageal reflux 04/30/2007     History of fundoplication     Last Assessment & Plan:   Emphasized continued attention to antireflux measures. Reminded that reflux can be a significant trigger for irritable airways, cough and wheeze.  History of fundoplication       Last Assessment & Plan:   Emphasized  continued attention to antireflux measures. Reminded that reflux can be a significant trigger for irritable airways, cough and wheeze.     ? 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 Management is mostly by her ENT physicians.     ? Fibromyalgia 04/30/2007     Qualifier: Diagnosis of   By: Yetta Barre CNA/MA, Rose Fillers: Diagnosis of   By: Yetta Barre CNA/MA, Shanda Bumps     ? Seasonal and perennial allergic rhinitis 04/30/2007     Qualifier: Diagnosis of   By: Yetta Barre CNA/MA, Shanda Bumps     Last Assessment & Plan:   She is now on prednisone 20 mg daily. I suggested she wait until her myasthenia status is stabilized. Then if she needs to she couldn't seek evaluation at one of the allergy practices in town as discussed, since our vaccine program will be closing.  Qualifier: Diagnosis of   By: Yetta Barre CNA/MA, Jessica       Last Assessment & Plan:   She is now on prednisone 20 mg daily. I suggested she wait until her myasthenia status is stabilized. Then if she needs to she couldn't seek evaluation at one of the allergy practices in town as discussed, since our vaccine program will be closing.     Tobacco: smoked  For 30 years     Surgical History:  No past surgical history on file.    Gyn Hx:  Menses 15   G2P2- first birth 40  Menopause :50's   Menopausal status: Postmenopausal  Hormone therapy use: None      Family and Social History  No family history on file.  Social History     Tobacco Use   ? Smoking status: Former     Types: Cigarettes     Quit date: 1985     Years since quitting: 38.8   ? Smokeless tobacco: Former   Substance Use Topics   ? Alcohol use: Not Currently       MEDS     No outpatient medications have been marked as taking for the 01/11/22 encounter (Appointment) with Fredda Hammed., MD.     Allergies   Allergen Reactions   ? Beta Adrenergic Blockers Other (See Comments)     Avoid d/t Myasthenia Gravis  Avoid d/t Myasthenia Gravis     ? Levofloxacin Other (See Comments)     Flare of Myasthenia Gravis   ? Macrolides And Ketolides Other (See Comments)     Avoid d/t Myasthenia Gravis  Use with caution d/t Myasthenia Gravis  Use with caution d/t Myasthenia Gravis     ? Sulfa Antibiotics Other (See Comments)     May possibly have caused deafness in one ear  May possibly have caused deafness in one ear  other     ? Botulinum Toxins Other (See Comments)     Muscle weakness r/t Myasthenia Gravis  Muscle weakness r/t Myasthenia Gravis     ? Statins Other (See Comments)     Muscle aches  Muscle aches  Muscle aches     ? Plastic Tape Itching and Other (See Comments)     Turns the skin red where applied       PHYSICAL EXAM     There were no vitals filed for this visit.   There is no height  or weight on file to calculate BMI.  Wt Readings from Last 3 Encounters:   01/08/22 62.2 kg (137 lb 3.2 oz)   01/03/22 61.4 kg (135 lb 4.8 oz)   11/22/21 61.2 kg (135 lb)       Review of Systems:  Pertinent items are noted in HPI.      Physical Exam:  General: No acute distress. Appears well-developed, well-nourished and close to stated age.   Head: Normocephalic, atraumatic.  Eyes: Sclera anicteric. EOMI.  ENT: Hearing grossly normal bilaterally. Oropharynx is clear, mucus membranes are moist.  No oral ulcers noted. Good dentition.  Neck: Supple. Trachea midline.   Cardiac: Regular rate and rhythm. Normal S1, S2. No murmurs, rubs, or gallops.  Respiratory: Clear to auscultation bilaterally. No wheezes, rales, or rhonchi noted. Respiratory effort appears normal.   Abdomen: Soft, nontender and nondistended. Bowel sounds are present and normoactive. No organomegaly is appreciated.  Musculoskeletal: No edema. No cyanosis. Extremities are warm and well-perfused.   Extremities: Warm. No edema. No cyanosis.  Neurologic: Gait appears normal. Sensation intact to light touch in all four extremities. Oriented to person, place and time.   Hematologic: No bruising, purpura or petechiae are noted.   Dermatologic: Skin intact.  No rashes appreciated.   Lymphatic: No palpable cervical, supraclavicular, axillary or inguinal adenopathy appreciated.   Psychiatric: Affect appropriate.  Pleasant and conversant.   Breast exam: no breast masses or axillary adenopathy noted     Recent Labs:  Lab Results   Component Value Date    WBC 5.37 01/03/2022    HGB 12.8 01/03/2022    HCT 39.4 01/03/2022    MCV 99.7 (H) 01/03/2022    PLT 339 01/03/2022      Lab Results   Component Value Date    NA 139 01/03/2022    K 4.6 01/07/2022    CL 102 01/03/2022    CO2 23 01/03/2022    CREAT 1.70 (H) 01/07/2022    BUN 28 (H) 01/03/2022     Lab Results   Component Value Date    ALT 12 01/07/2022    AST 26 01/03/2022    ALKPHOS 81 01/03/2022    BILITOT 0.3 01/03/2022     Lab Results   Component Value Date    CALCIUM 10.1 01/03/2022       Pertinent imaging:  11/13/2021 CT chest wo  History of bilateral breast cancer without definite metastatic disease in the thorax  ?  05/03/2021 MR breast  1. Right breast:   *  Interval decrease in size of known malignancies in the right breast with trace residual enhancement. Recommend continued surgical and oncological management. BI-RADS CATEGORY 6 - KNOWN BIOPSY-PROVEN MALIGNANCY.   ?  2. Left breast:  *  Grossly stable irregular mass at 11 o'clock corresponding to biopsy-proven malignancy. Recommend continued surgical and oncological management. BI-RADS CATEGORY 6 - KNOWN BIOPSY-PROVEN MALIGNANCY.   *  Interval resolution of previously described regional nonmass enhancement in the upper inner, upper outer, and lower outer quadrants. Recommend continued surgical management. BI-RADS CATEGORY 4 - SUSPICIOUS.   ?  3. Additional findings:  *  STIR hyperintense fluid collection in the right glenohumeral joint/subacromial region may represent bursitis. Correlate with symptoms/physical exam.  ?  OVERALL ASSESSMENT: BI-RADS CATEGORY 4 - SUSPICIOUS.    05/03/2021 MR cervical spine  Redemonstrated congenital cervical spinal canal stenosis with superimposed multilevel degenerative changes, which are mildly progressive compared to prior as above. No evidence of cord compression or cord  signal abnormality.  ?  03/14/2021 NM stress test  1) There is a moderate-severe, fixed perfusion defect in the distal anteroseptal wall and apex suggestive for a prior MI.   ?  No scintigraphic evidence for stress-induced ischemia.    ?  2) Normal left ventricular size and reduced global LV systolic function, EF 36%. There are regional wall motion abnormalities present as indicated above.  ?  3) For physiologic and electrocardiographic findings, please refer to a separate report.  ?  4) There are no prior studies available for comparison.      12/18/2020 CT abd/pelvis wo contrast  1.  No acute CT abnormality in the abdomen or pelvis.  2.  Moderate hiatal hernia with postsurgical changes likely related to reported prior Nissen fundoplication.  3.  Left-sided diverticulosis without evidence diverticulitis.  4.  History of bilateral breast cancer without evidence of metastatic disease within the abdomen or pelvis.    12/18/2020 CTA chest   1.  No acute aortic process. No acute process within the chest.  2.  History of bilateral breast cancer without evidence of intrathoracic metastatic disease.  3.  Additional findings as above.    11/18/2020 MRI breast bilat  1. Right breast:   *  Stable to slightly decreased size of known biopsy proven malignancies in the right breast. BI-RADS CATEGORY 6 - KNOWN BIOPSY-PROVEN MALIGNANCY. Recommend continued surgical and oncologic management.   ?  2. Left breast:  *  Decreased size of the irregular mass with microclip at 11:00, 5 cm from the nipple, consistent with known malignancy. BI-RADS CATEGORY 6 - KNOWN BIOPSY-PROVEN MALIGNANCY. Recommend continued surgical and oncologic management.   *  Nonmass enhancement spanning 40 mm has also slightly decreased in size and now demonstrates subthreshold kinetics. This remains suspicious. If breast conservation is desired, an MRI guided biopsy is recommended. BI-RADS CATEGORY 4 - SUSPICIOUS.  *  Previously described oval enhancing mass at 4:00 is no longer identified. BI-RADS CATEGORY 2.  ?  OVERALL ASSESSMENT: BI-RADS CATEGORY 4 - SUSPICIOUS.    08/26/2020 DXA lumbar spine/hip  -----------------------------------------------------------------   Region ? ? ? ? ? ? ? ? ? BMD ? ?T-score ?Z-score ? Classification   -----------------------------------------------------------------   AP Spine(L1-L4) ? ? ? ? ?1.062 ? ?0.1 ? ? ?2.9 ? ? ? Normal   Femoral Neck (Left) ? ? ?0.723 ? -1.1 ? ? ?1.3 ? ? ? Osteopenia   Total Hip (Left) ? ? ? ? 0.725 ? -1.8 ? ? ?0.4 ? ? ? Osteopenia   -----------------------------------------------------------------   Impression: The BMD for  the AP Spine(L1-L4) decreased, changing by -2.4% since the last DXA exam.     08/22/2020 PETCT  1.  Mild to moderate left greater than right FDG activity associated with small soft tissue densities in the bilateral breasts in the regions marked by breast biopsy clips.   2.  No evidence of FDG avid metastatic disease.    06/19/2019 US pelvis  Small subserosal uterine fundal fibroid. Otherwise normal postmenopausal pelvic ultrasound.        Pertinent pathology:  08/02/2020 [facility] genetics      07/13/2020 Tissue exam  A. BREAST, RIGHT, MASS, 12:00, 6 CM FROM NIPPLE (NEEDLE CORE BIOPSY):  - Invasive ductal carcinoma with lobular and focal cribriform features, grade 2 (50% of biopsy; longest segment 4 mm)             Modified Bloom and Richardson score: 6 of 9  Tubule formation:                3                        Nuclear pleomorphism:       2                        Mitotic score:                       1  - Ductal carcinoma in situ (DCIS), low nuclear grade, cribriform type with focal central necrosis  - Breast biomarkers: See below  - ERBB2 (HER2) FISH: Negative for HER2 gene amplification   ?  B.  BREAST, LEFT, MASS, 11:00, 5 CM FROM NIPPLE (NEEDLE CORE BIOPSY):  - Invasive ductal carcinoma with lobular features, grade 2 (50% of biopsy; longest involved segment 6 mm)             Modified Bloom and Richardson score: 6 of 9                        Tubule formation:                3                        Nuclear pleomorphism:       2                        Mitotic score:                       1  - Ductal carcinoma in situ (DCIS) is not identified  - Breast biomarkers: See immunohistochemistry report below  - ERBB2 (HER2) FISH: Negative [based on IHC (2+) and FISH, see comment].     COMMENT: It is uncertain whether patients with an average of >4.0 and <6.0 HER2?signals per cell and a HER2/CEP17 ratio of <2.0 benefit from HER2-targeted therapy in the absence of protein overexpression (IHC 3+). If the specimen test result is close to the [facility] ratio threshold for positive, there is a higher likelihood that repeat testing will result in different results by chance alone. Therefore, when [facility] results are not 3+ positive, it is recommended that the sample be considered negative without additional testing on the specimen.    BREAST BIOMARKER REPORT  ?  BLOCK:  A1  FIXATIVE:  Formalin   ?  RESULT ER/PR:  ? ESTROGEN   RECEPTOR PROGESTERONE RECEPTOR   Antibody Clone SP1 Clone 636   %Tumor Staining >95% >95%   Intensity (1+ to 3+) 3+ 3+   ?  RESULT HER-2/neu IHC assay (utilizing FDA-approved DAKO Hercep Test): Arch Pathol Lab Med [phone], ASCO/CAP HER2 Testing in Breast Cancer Update - Veronia Beets al  ?  Test Score: 1+  ?  HER2/neu:  Negative  ?  RESULT Ki-67 (Clone MIB1):  5%  ?  BREAST BIOMARKER REPORT  ?  BLOCK:  B1  FIXATIVE:  Formalin   ?  RESULT ER/PR:  ? ESTROGEN   RECEPTOR PROGESTERONE RECEPTOR   Antibody Clone SP1 Clone 636   %Tumor Staining >95% 0%   Intensity (1+ to 3+) 3+ NA   ?  RESULT HER-2/neu IHC assay (utilizing FDA-approved DAKO Hercep Test): Arch Pathol Lab Med [phone], ASCO/CAP  HER2 Testing in Breast Cancer Update - Veronia Beets al  ?  Test Score: 2+  ?  HER2/neu:  Equivocal  ?  RESULT Ki-67 (Clone MIB1):  10%    07/19/2017 Outside pathology        ASSESSMENT & PLAN   Asuka Dusseau is a 86 y.o. female presents for     1. 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 She elected not to have surgery at that time. She took tamoxifen-->anastrozole for a short period of time, but discontinued endocrine therapy due to side effects.   - R breast biopsy at 12:00, 6 cm from nipple on 07/13/2020 revealed invasive ductal carcinoma with lobular and focal cribriform features, grade 2 ER (+) PR (+) HER2 (-), Ki-67 5%  - L breast biopsy at 11:00, 5 cm from nipple on 07/13/2020 revealed invasive ductal carcinoma with lobular features, grade 2 ER (+) PR (-) HER2 (-), Ki-67 10%  - We had a long discussion about biology and treatment of breast cancer, with emphasis that cure can only be attained with multimodality treatments such as surgery/ +/- radiation and systemic ( endocrine) therapy .  We have discussed that given her concern for anesthesia with Myasthenia Gravis, we cat try neoadjuvant endocrine therapy with close observation of side effects.  - We will start letrozole 2.5 mg po daily 08/17/2020 --> 10/28/2020 discontinued due to diarrhea  - tumors are not palpable on exam, and she would have to be followed with imaging   -  PETCT on 08/22/2020 with no evidence of FDG avid metastatic disease.   - Arimidex 10/28/2020 --->   - Breast MRI on 11/18/2020 with stable to slightly decreased size of known biopsy proven malignancies in the right breast, decreased size of the irregular mass with microclip at 11:00, 5 cm from the nipple, consistent with known malignancy, nonmass enhancement spanning 40 mm has also slightly decreased in size and now demonstrates subthreshold kinetics, this remains suspicious, previously described oval enhancing mass at 4:00 is no longer identified.   - CT CAP on 12/18/2020 without evidence of metastatic disease   - Breast MRI on 11/18/2020 with stable to slightly decreased size of known biopsy proven malignancies in the right breast, decreased size of the irregular mass with microclip at 11:00, 5 cm from the nipple, consistent with known malignancy, nonmass enhancement spanning 40 mm has also slightly decreased in size and now demonstrates subthreshold kinetics, this remains suspicious, previously described oval enhancing mass at 4:00 is no longer identified.  - Held Arimidex 1 mg po daily d/t diarrhea (8/23-9/18/2023)  - Patient has opted against breast surgery d/t age, co morbidities, anaesthetic risk iso myasthenia gravis  - She would like to explore alternatives to surgery such as radiation. Discussed case with rad/onc team and will obtain repeat breast imaging. She consulted with Dr. Lindajo Royal today who advised that she is not currently a candidate for RT.     01/11/2022  - MR abd/pelv 11/23/2021 (-)   - Diagnostic bilateral MMG/US ordered to assess status of disease- scheduled 12/1  - Will try switching to Exemestane 25mg  po daily to evaluate for improvement in diarrhea- suspect class effect, but will try 11/14/2021 -->     2.  Diarrhea  - Longstanding, hx of IBS, but exacerbated by AI use, similar on letrozole and arimidex.  -  Consult with Dr. Riley Kill 02/10/2021. May be related to aromatase inhibitor. However she states it has been worse in the last 1-2 months and this medication class was started earlier in the year (attributed diarrhea to letrozole, swithced to arimidex in Sept). Will r/o infection, check FC. Can consider SIBO evaluation, colonoscopy for microscopic colitis.   - Held anastrazole 10/18/2021-->11/13/2021 with resolution of diarrhea.   - MR Abd/Pelv for further assessment given >20lbs weight loss associated with diarrhea 9/28 (-) for metastatic disease.   - Strongly encouraged patient to submit stool studies ordered by Dr. Riley Kill  - Will start with 1/2 tablet imodium q AM and increase to 1 tablet daily as needed  - Consider lomotil if imodium is ineffective or not tolerated  - Will try to switch to exemestane 9/19 -->     3.  Myasthenia gravis   - F/u w Dr. Claudie Leach, Neurology, on 08/15/2021. Referral for PT regarding neck exercise. Referral for Acupuncture. Physical Therapy referral for gait and balance training for cervical spinal stenosis. Contact number for scheduling: [phone]. Continue IVIg 60g (1g/kg) frequency to every 3 weeks at home with Allur.Continue Azathioprine 75 mg twice per day by mouth.     4.  Chest pain  - Hospital admission 10/23-10/25/2022. P/w?atypical?chest pain x 2-3 days?with new LBBB but negative troponin and relief w/ antacid medications most c/f upper GI pathology such as severe GERD, PUD, or gastritis.?CT AP without acute abnormality. Initiated pantoprazole 40 mg PO bid with improvement in symptoms with PPI and GI cocktail w/maalox. Patient able to tolerate all meals without recurrent chest pain. GI was consulted and given improvement in symptoms, EGD deferred. Continue?Maalox q4h PRN. H pylori stool Ag discontinued given long term PPI use. Continue dexelant outpatient. Ondansetron?4 mg PO q6hr PRN Rx for nausea.   - Outpatient GI follow-up scheduled w Dr. Riley Kill on 01/12/2021      5. Bone density  - DXA scan on 08/26/2020 demonstrated osteopenia of the left femoral neck and total hip.        DIAGNOSES/ORDERS addressed on 01/11/2022:  1. Breast cancer (HCC/RAF)      - on Aromasin - continue close monitoring for toxicity of intensive drug therapy.    NEW PROBLEMS AS OF 01/11/2022:     []  New minor or self-limited problem (99202/99212).  []  2+ minor or self-limited problem, stable chronic illness, acute uncomplicated illness/injury (99203/99213).  []  Chronic illness with exacerbation/progression/tx side effect; 2+ stable chronic illnesses, new problem with uncertain prognosis, acute illness with systemic symptoms, or acute complicated injury (99204/99214).  []  Chronic illness with severe exacerbation/progression/tx side effect, acute/chronic illness or injury that poses threat to life or bodily function (99205/99215).      REVIEW OF DATA   I have  [x]  Reviewed/ordered []  1 []  2 [x]  ? 3 unique laboratory, radiology, and/or diagnostic tests noted below  No orders of the defined types were placed in this encounter.    12/18/2020 CT abd/pelvis wo contrast  12/18/2020 CTA chest   12/20/2020 CBC, BMP    [x]  Reviewed []  1 []  2 [x]  ? 3 prior external notes and incorporated into patient assessment   I reviewed Dr. Velvet Bathe, MD in Surgery, Surgical Oncology on 08/10/2020.  I reviewed Dr. York Ram, MD in Medicine, Internal Medicine on 07/13/2020.  I reviewed Dr. Johnney Killian. Desma Maxim, MD in Obstetrics & Gynecology on 05/07/2019.    []  Discussed management or test interpretation with external provider(s) on 01/11/2022 as noted:  RISK OF COMPLICATION   This Clinical research associate has deemed the above diagnoses to have a risk of complication, morbidity or mortality of:   []  Minimal  []  Low  []  Moderate   []  due to prescription drug management   [] Decision re: minor surgery   [] Diagnosis or treatment limited by social determinants of health  []  Severe    []  due to intensive monitoring for chemotherapy toxicity   []  Decision elective surgery with risk factors  []  Decision regarding need for hospitalization  []  Advanced Care Planning decisions    SOCIAL DETERMINANTS OF HEALTH   []  The diagnosis or treatment of said conditions is significantly limited by social determinants of health:      I spent 30 minutes counseling and coordinating care for the items checked below on the day of service 01/11/2022  [x]  Preparing to see the patient (e.g., review of tests)  [x]  Obtaining and or reviewing separately obtained history   [x]  Performing a medically appropriate examination and/or evaluation   [x]  Counseling and educating the patient/family/caregiver   [x]  Ordering medications, tests, or procedures  []  Referring and communicating with other healthcare professionals (when not separately reported)  [x]  Documenting clinical information in the EHR  []  Independently interpreting results and communicating results to patient/ family/caregiver     No follow-ups on file.       The above recommendation were discussed with the patient. The patient has all questions answered satisfactorily and is in agreement with this recommended plan of care.    Attestation:  Orthoptist:  I, Candis Musa, have scribed for [employer]. Oscar La, MD with the documentation for Megahn Killings on 01/11/2022.    Physician Signature:   Rhona Leavens. Oscar La, MD 01/11/2022 8:29 AM    I have reviewed this note, scribed by Candis Musa and attest that it is an accurate representation of my H & P and other events of the outpatient visit except if otherwise noted.

## 2022-01-10 ENCOUNTER — Telehealth: Payer: BLUE CROSS/BLUE SHIELD

## 2022-01-10 NOTE — Telephone Encounter
Call Back Request      Reason for call back: Patient is calling on regards to [place]. [facility] has not received the order for the medication    Any Symptoms:  '[]'$  Yes  '[x]'$  No       If yes, what symptoms are you experiencing:    o Duration of symptoms (how long):    o Have you taken medication for symptoms (OTC or Rx):      If call was taken outside of clinic hours:    '[]'$ Patient or caller has been notified that this message was sent outside of normal clinic hours.     '[]'$ Patient or caller has been warm transferred to the physician's answering service. If applicable, patient or caller informed to please call us back if symptoms progress.  Patient or caller has been notified of the turnaround time of 1-2 business day(s).

## 2022-01-10 NOTE — Telephone Encounter
Please see below.

## 2022-01-11 ENCOUNTER — Ambulatory Visit: Payer: BLUE CROSS/BLUE SHIELD | Attending: Hematology & Oncology

## 2022-01-11 ENCOUNTER — Ambulatory Visit: Payer: BLUE CROSS/BLUE SHIELD

## 2022-01-11 MED ORDER — DAPAGLIFLOZIN PROPANEDIOL 10 MG PO TABS
10 mg | ORAL_TABLET | Freq: Every day | ORAL | 11 refills | Status: AC
Start: 2022-01-11 — End: ?

## 2022-01-11 NOTE — Telephone Encounter
Ordered    Future Appointments   Date Time Provider Stella   01/11/2022  1:20 PM Kathi Simpers., MD HEM/ONC [place] [facility]   01/26/2022  2:30 PM MP2 MAM PANEL01 (HOSP) MAM MP2 [place]/Cen   02/06/2022  2:15 PM Orbie Hurst, MD CRD DIS 2020 [facility]   02/13/2022 11:30 AM NEU MG CLINIC [facility] NEUMUSC B200 [place]/Cen   03/13/2022  1:40 PM Molinda Bailiff., MD GERI IMS 420 [place]/Cen

## 2022-01-11 NOTE — Telephone Encounter
Spoke with patient relayed message below.

## 2022-01-26 ENCOUNTER — Ambulatory Visit: Payer: BLUE CROSS/BLUE SHIELD

## 2022-01-30 ENCOUNTER — Telehealth: Payer: BLUE CROSS/BLUE SHIELD

## 2022-02-06 ENCOUNTER — Ambulatory Visit: Payer: BLUE CROSS/BLUE SHIELD

## 2022-02-07 ENCOUNTER — Ambulatory Visit: Payer: BLUE CROSS/BLUE SHIELD

## 2022-02-07 DIAGNOSIS — E119 Type 2 diabetes mellitus without complications: Secondary | ICD-10-CM

## 2022-02-07 DIAGNOSIS — Z0100 Encounter for examination of eyes and vision without abnormal findings: Secondary | ICD-10-CM

## 2022-02-13 ENCOUNTER — Ambulatory Visit: Payer: BLUE CROSS/BLUE SHIELD

## 2022-02-21 ENCOUNTER — Ambulatory Visit: Payer: BLUE CROSS/BLUE SHIELD | Attending: Rheumatology

## 2022-03-06 ENCOUNTER — Ambulatory Visit: Payer: BLUE CROSS/BLUE SHIELD

## 2022-03-06 ENCOUNTER — Telehealth: Payer: BLUE CROSS/BLUE SHIELD

## 2022-03-06 DIAGNOSIS — I5022 Chronic systolic (congestive) heart failure: Secondary | ICD-10-CM

## 2022-03-06 DIAGNOSIS — I10 Essential (primary) hypertension: Secondary | ICD-10-CM

## 2022-03-06 MED ORDER — LOSARTAN POTASSIUM 100 MG PO TABS
100 mg | ORAL_TABLET | Freq: Every day | ORAL | 3 refills | Status: AC
Start: 2022-03-06 — End: ?

## 2022-03-06 MED ORDER — REPATHA SURECLICK 140 MG/ML SC SOAJ
1 | SUBCUTANEOUS | 6 refills | Status: AC
Start: 2022-03-06 — End: ?

## 2022-03-06 NOTE — Telephone Encounter
[name] (Key: [id number])  Repatha SureClick '140MG'$ /ML auto-injectors  Status: Question Response - N/A  Created: January 9th, 2024  Information regarding your request  Available without authorization. The member is able to fill the requested drug at the pharmacy. If coverage is still needed or requesting prior to the expiration of a current authorization, a request can be made by sending a fax or calling the number on the back of the member's ID card.

## 2022-03-06 NOTE — Consults
CARDIOLOGY NOTE      PATIENT: [name]   MRN: [id number]   DOB: 03/12/1935   DATE OF SERVICE: 03/06/2022    PCP:  York Ram., MD      Problem List Items Addressed This Visit       Aortic calcification (HCC/RAF)     Other Visit Diagnoses       Essential hypertension    -  Primary    Systolic CHF, chronic (HCC/RAF)                  Interval History:     The patient feels SOB/dyspnea and tired  She last used Repatha ''it has been a while''  Ambulates with a walker  ET: 1/2 block, stable     Prior Encounter:     This is a 87 y.o. female who is here for cardiac evaluation.  Indication:   SOB    The patient is a/w friend, Jacobo Forest    Pt was recently hospitalized with SOB and chest pain.   Pt ambulates with a walker for precautionary reasons given history of falls.   With activity, the patient reports SOB/dypnea. She walks the dog 1/2 block  Her symptoms have been ongoing for the past 3 years, progressive worsening.   The patient notes allergies and asthma.   About 1 year ago, she could walk 1.5 blocks         Past Medical History:   Diagnosis Date    Breast cancer (HCC/RAF) 12/20/2018       No past surgical history on file.    Allergies:   Allergies   Allergen Reactions    Beta Adrenergic Blockers Other (See Comments)     Avoid d/t Myasthenia Gravis  Avoid d/t Myasthenia Gravis      Levofloxacin Other (See Comments)     Flare of Myasthenia Gravis    Macrolides And Ketolides Other (See Comments)     Avoid d/t Myasthenia Gravis  Use with caution d/t Myasthenia Gravis  Use with caution d/t Myasthenia Gravis      Sulfa Antibiotics Other (See Comments)     May possibly have caused deafness in one ear  May possibly have caused deafness in one ear  other      Botulinum Toxins Other (See Comments)     Muscle weakness r/t Myasthenia Gravis  Muscle weakness r/t Myasthenia Gravis      Statins Other (See Comments)     Muscle aches  Muscle aches  Muscle aches      Plastic Tape Itching and Other (See Comments)     Turns the skin red where applied       Social History     Socioeconomic History    Marital status: Widowed   Tobacco Use    Smoking status: Former     Types: Cigarettes     Quit date: 1985     Years since quitting: 39.0    Smokeless tobacco: Former   Substance and Sexual Activity    Alcohol use: Not Currently    Drug use: Never       No family history on file.  Adopted     Objective:     BP 144/76  ~ Pulse (!) 102  ~ Wt 137 lb 6.4 oz (62.3 kg)  ~ SpO2 93%  ~ BMI 22.86 kg/m?     Vitals:    03/06/22 1411   BP: 144/76   Pulse: (!) 102   SpO2: 93%  Weight: 137 lb 6.4 oz (62.3 kg)       BP Readings from Last 3 Encounters:   03/06/22 144/76   01/11/22 144/67   01/08/22 133/68       Body mass index is 22.86 kg/m?Marland Kitchen    Wt Readings from Last 3 Encounters:   03/06/22 137 lb 6.4 oz (62.3 kg)   01/11/22 136 lb 9.6 oz (62 kg)   01/08/22 137 lb 3.2 oz (62.2 kg)          General Exam: no apparent distress.    Neurologic/Psychiatric: alert and oriented. Affect is appropriate.  HEENT: Sclerae are anicteric. There is no epistaxis.  Cardiac: regular in rate and rhythm.    Respiratory: non-labored breathing. No use of accessory muscles. No wheeze.  Abdomen: bowel sounds are present. Abdomen is soft. No rebound.    Extremities: no edema. No cyanosis or clubbing.    Integument: no petechiae. No acute rash.         Laboratory:  Lab Results   Component Value Date    NA 139 01/03/2022    K 4.6 01/07/2022    CL 102 01/03/2022    CO2 23 01/03/2022    BUN 28 (H) 01/03/2022    CREAT 1.70 (H) 01/07/2022    GLUCOSE 81 01/03/2022    CALCIUM 10.1 01/03/2022     Lab Results   Component Value Date    CKTOT 54 01/07/2022    CKTOT 50 09/01/2021    TROPONIN 0.05 (H) 12/19/2020    TROPONIN 0.07 (H) 12/19/2020    TROPONIN 0.06 (H) 12/18/2020     Lab Results   Component Value Date    BNP 156 (H) 06/03/2021     Lab Results   Component Value Date    WBC 5.37 01/03/2022    HGB 12.8 01/03/2022    HCT 39.4 01/03/2022    MCV 99.7 (H) 01/03/2022    PLT 339 01/03/2022     Lab Results   Component Value Date    CHOL 226 01/07/2022    CHOLHDL 70 01/07/2022    CHOLDLQ 144 (H) 01/07/2022    TRIGLY 68 01/07/2022     Lab Results   Component Value Date    PT 12.9 12/18/2020    INR 1.0 12/18/2020     No components found for: ''HA1C''  Lab Results   Component Value Date    TSH 2.3 01/03/2022       Patient Active Problem List   Diagnosis    Acute medial meniscal tear, left, subsequent encounter    Angioedema    Anxiety    Asthmatic bronchitis, mild persistent, with acute exacerbation    Chronic cholecystitis    Chronic interstitial cystitis    CKD (chronic kidney disease), stage III (HCC/RAF)    COPD mixed type (HCC/RAF)    Major depression, recurrent, chronic (HCC/RAF)    Diabetes mellitus (HCC/RAF)    Esophageal reflux    Eustachian tube dysfunction    Fibromyalgia    H/O arthroscopy    Insomnia    Myasthenia gravis with exacerbation (HCC/RAF)    Nocturnal hypoxemia    Osteoarthritis of knee    Renal cyst    Seasonal and perennial allergic rhinitis    Spondylosis without myelopathy or radiculopathy, lumbar region    Urethral caruncle    Vaginal atrophy    Benzodiazepine dependence (HCC/RAF)    Breast cancer (HCC/RAF)    Refractive error    Ptosis of eyelid  Pseudophakia of both eyes    Aortic calcification (HCC/RAF)    HTN (hypertension)    Immunosuppressed status (HCC/RAF)    Acute chest pain    Atypical chest pain    Heart failure with reduced ejection fraction LVEF <=40% (HCC/RAF)       Outpatient Medications Prior to Visit   Medication Sig    Albuterol Sulfate AEPB Takes 1 to 2 puffs every 4 hours as needed for wheezing or SOB Inhale Takes 1 to 2 puffs every 4 hours as needed for wheezing or SOB .Marland Kitchen    aspirin 81 mg EC tablet Take 1 tablet (81 mg total) by mouth daily.    azaTHIOprine 50 mg tablet Take one and one-half tablet (75 mg total) by mouth two (2) times daily.    clonazePAM 1 mg tablet 1 mg in pm.    dapagliflozin 10 mg tablet Take 1 tablet (10 mg total) by mouth daily.    DEXILANT 60 MG DR capsule TAKE 1 CAPSULE (60 MG TOTAL) BY MOUTH DAILY    DULoxetine 20 mg DR capsule Take 3 capsules (60 mg total) by mouth daily.    exemestane 25 mg tablet Take 1 tablet (25 mg total) by mouth daily.    fluticasone-vilanterol (BREO ELLIPTA) 100-25 mcg/inh inhaler Inhale 1 puff daily.    GAMUNEX-C 20 GM/200ML infusion     montelukast 10 mg tablet Take 1 tablet (10 mg total) by mouth as needed for.    PYRIDOSTIGMINE 60 mg tablet TAKE 1 TABLET (60 MG TOTAL) BY MOUTH THREE (3) TIMES DAILY AS NEEDED    TRIMETHOPRIM 100 mg tablet TAKE 1 TABLET (100 MG TOTAL) BY MOUTH DAILY    WELLBUTRIN SR 200 MG 12 hr tablet TAKE 1 TABLET (200 MG TOTAL) BY MOUTH TWO (2) TIMES DAILY    Evolocumab (REPATHA SURECLICK) 140 MG/ML SOAJ Inject 1 pen. under the skin every fourteen (14) days.    losartan 50 mg tablet Take 1 tablet (50 mg total) by mouth daily.     No facility-administered medications prior to visit.       Current Outpatient Medications   Medication Sig    Albuterol Sulfate AEPB Takes 1 to 2 puffs every 4 hours as needed for wheezing or SOB Inhale Takes 1 to 2 puffs every 4 hours as needed for wheezing or SOB .Marland Kitchen    aspirin 81 mg EC tablet Take 1 tablet (81 mg total) by mouth daily.    azaTHIOprine 50 mg tablet Take one and one-half tablet (75 mg total) by mouth two (2) times daily.    clonazePAM 1 mg tablet 1 mg in pm.    dapagliflozin 10 mg tablet Take 1 tablet (10 mg total) by mouth daily.    DEXILANT 60 MG DR capsule TAKE 1 CAPSULE (60 MG TOTAL) BY MOUTH DAILY    DULoxetine 20 mg DR capsule Take 3 capsules (60 mg total) by mouth daily.    exemestane 25 mg tablet Take 1 tablet (25 mg total) by mouth daily.    fluticasone-vilanterol (BREO ELLIPTA) 100-25 mcg/inh inhaler Inhale 1 puff daily.    GAMUNEX-C 20 GM/200ML infusion     montelukast 10 mg tablet Take 1 tablet (10 mg total) by mouth as needed for.    PYRIDOSTIGMINE 60 mg tablet TAKE 1 TABLET (60 MG TOTAL) BY MOUTH THREE (3) TIMES DAILY AS NEEDED    TRIMETHOPRIM 100 mg tablet TAKE 1 TABLET (100 MG TOTAL) BY MOUTH DAILY    WELLBUTRIN SR 200  MG 12 hr tablet TAKE 1 TABLET (200 MG TOTAL) BY MOUTH TWO (2) TIMES DAILY    [DISCONTINUED] Evolocumab (REPATHA SURECLICK) 140 MG/ML SOAJ Inject 1 pen. under the skin every fourteen (14) days.    [DISCONTINUED] losartan 50 mg tablet Take 1 tablet (50 mg total) by mouth daily.    Evolocumab (REPATHA SURECLICK) 140 MG/ML SOAJ Inject 1 pen. under the skin every fourteen (14) days.    losartan 100 mg tablet Take 1 tablet (100 mg total) by mouth daily.    [EXPIRED] methenamine hippurate 1 g tablet Take 1 tablet (1 g total) by mouth two (2) times daily.     No current facility-administered medications for this visit.             2018 ACC/AHA guidelines recommends high-intensity statin because of ASCVD diagnosis. Patient is intolerant to statin. Consider non-statin Rx or referral.  10-Year ASCVD risk N/A. Age > 77. as of 2:23 PM on 03/06/2022.  10-Year ASCVD risk with optimal risk factors cannot be calculated.  Values used to calculate ASCVD score:  Age: 87 y.o. Cannot calculate risk because age is not between 58 and 62 years old.  Gender: Female Race: White  70 mg/dL. (measured on 01/07/2022)  226 mg/dL. (measured on 01/07/2022)  144 mg/dL. (measured on 01/07/2022)  144 mm Hg. (measured on 03/06/2022)  Yes  currently not a smoker  Yes  Click here for the [facility] ASCVD Cardiovascular Risk Estimator Plus tool (online calculator).      Assessment:     Lorrayne Ismael is 87 y.o. female with    Cardiomyopathy, EF 35-40%. Echo in 2023    - avoiding BBlockers due to myasthenia gravis    Abnormal Myoview in 2023: There is a moderate-severe perfusion defect in the distal anteroseptal wall and apex, suggestive for MI.  SOB/dyspnea. Uses oxygen concentrator  Abnormal EKG with LBBB and PACs  Former tobacco use  Subtle calcifications of the aortic root. Moderately calcified thoracic aorta and its main branch vessels.  Intolerant to Statins    Plan:     Losartan 50 mg, increase to 100 mg  ASA    Repatha - discussed compliance   Roderic Palau, [name], MD  Clelia Croft, MD  Hi Virgil Slinger.   She and I had a thoughtful discussion today.  In light of her breast cancer and her Myasthenia Gravis, it seems like medical management of her CAD is best.         Orders Placed This Encounter    ECG 12-Lead Clinic Performed    Echo adult transthoracic complete    Evolocumab (REPATHA SURECLICK) 140 MG/ML SOAJ    losartan 100 mg tablet           Return in about 3 months (around 06/05/2022).      There are no Patient Instructions on file for this visit.    Future Appointments   Date Time Provider Department Center   03/07/2022  4:00 PM Velvet Bathe., MD Surg [facility] [place]/Cen   03/21/2022  1:00 PM SM [facility] MAM IR01 MAM SM [facility] [place]   03/27/2022  4:00 PM York Ram., MD GERI IMS [address] [place]/Cen   04/03/2022  1:00 PM Odetta Pink., MD, PhD [facility] B200 [place]/Cen         Author: Clelia Croft, MD

## 2022-03-07 ENCOUNTER — Ambulatory Visit: Payer: BLUE CROSS/BLUE SHIELD

## 2022-03-07 NOTE — Progress Notes
[facility]    Patient: [name]  MRN: [id number]  DOB: 1935/11/09    Date of Service: 03/07/2022    Patient Care Team:  York Ram., MD as PCP - General (Medicine, Geriatric Medicine)  Marisue Ivan, MD  Referring Provider: Dr. Brayton El    Reason for Visit: Bilateral breast cancer    HPI:  [name] is a 87 y.o. 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 She elected not to have surgery at that time. She took tamoxifen-->anastrozole for a short period of time, but discontinued endocrine therapy due to side effects.     [name] then noted left breast pain in April 2022. Subsequently, she underwent a diagnostic bilateral breast imaging work-up in April to May 2022 at [facility] that revealed heterogeneously dense breast tissue and the following findings:    Right breast:  1. A mass with spiculated margins and associated coil biopsy marker clip in the upper outer quadrant of the right breast at anterior depth-->biopsy-proven DCIS in 2019.    2. A solid mass with indistinct margins measuring 14 x 4 x 10 mm in the right breast at 1 o'clock located 6 centimeters from the nipple. There is associated microclip. This correlates with the mammographic focal asymmetry the X clip-->biopsy proven DCIS from 2019.     3. Adjacent area of architectural distortion in the right breast upper inner quadrant at posterior depth, slightly more posterior than the X microclip. Ultrasound demonstrates a not parallel solid mass with irregular margins measuring 8 x 4 x 5 mm seen in the right breast at 12 o'clock, upper outer quadrant located 6 centimeters from the nipple. This may correspond with mammographic architectural distortion-->invasive ductal carcinoma with lobular and cribriform features, grade 2, ER+, PR+, Her-2 1+ on IHC and negative on FISH, Ki-67 5%.     4. Grouped calcifications measuring 6 mm seen in the upper outer quadrant of the right breast at posterior depth located 5 centimeters from the nipple.     5. There is an oval mass measuring 4 x 3 x 2 mm seen in the right breast at 12 o'clock, upper outer quadrant located 6 centimeters from the nipple. Probably benign.      6. There is a solid mass with indistinct margins measuring 5 x 3 x 2 mm seen in the right breast at 2 o'clock, upper inner quadrant located 4 centimeters from the nipple. Probably benign.      Left breast:  1. An irregular mass with angular margins measuring 10 x 9 x 9 mm seen in the left breast at 11 o'clock located 5 centimeters from the nipple. This corresponds to the mammographic and palpable finding-->invasive ductal carcinoma with lobular, grade 2, ER+, PR-, Her-2 2+ on IHC and pending on FISH, Ki-67 10%.    2. A vague hypoechoic area measuring 8 x 5 x 6 mm seen in the left breast at 12 o'clock, upper outer quadrant located 3 centimeters from the nipple. Probably benign.      3. A solid mass measuring 6 x 3 x 5 mm seen in the left breast at 1 o'clock located 1 centimeter from the nipple. Probably benign.      4. There is an axillary lymph node in the left axilla with cortical thickness measuring up to 6 mm.    [name] had recent MRI performed that redemonstrated  multifocal right breast malignancy and left breast IDC with an additional 4.3 cm of NME that spanned nearly the entire left breast that appears suspicious. Recent genetic testing was negative for pathogenic mutations.     After extensive multidisciplinary discussions, we elected to proceed with neoadjuvant/possibly definitive endocrine therapy due to her age, fragility and comorbidities.     Interval History:   Anastrozole c/b severe diarrhea-->exemestane.     05/03/2021 Bilateral Breast MRI:   Right: Residual enhancement at 11:00 UIQ 5 cm FN, w/ clip 13 mm anteriorly corresponding to biopsy proven malignancy. Residual enhancement at 12:00 UOQ 6 cm FN w/ clip corresponding to biopsy proven malignancy. Residual enhancement at 9:00 UIQ 2 cm FN w/ clip corresponding to biopsy proven malignancy.   Left: Stable irregular mass w/ clip measuring 13 x 9 x 13 mm at 11:00 UIQ 5 cm FN, corresponding to biopsy proven malignancy. Interval resolution of previously described regional nonmass enhancement in UIQ, UOQ, and LOQ    01/26/2022 Diagnostic Bilateral MMG:  The breast tissue is heterogeneously dense, which may obscure detection of small masses (density C).  Finding 1:  There is a focal asymmetry with associated X microclip seen in the right breast upper  inner quadrant.  Right breast ribbon clip also noted  Finding 2:  There is an area of architectural distortion seen in the right breast.  This area is  less prominent today.  Finding 3:  There are calcifications seen in the CC view only seen in the lateral aspect of the  right breast at anterior depth.  Finding 4:  There is a mass with associated microclip seen in the upper outer quadrant of the right  breast. This is at anterior depth. The clip is a coil clip.  Finding 5:  There is an irregular mass with associated microclip seen in the upper inner quadrant of  the left breast. The microclip is a coil.    Right breast:  -Mass 1:00 6 cm FN measuring 13 x 4 x 11 mm, Decreased size. X clip is not well visualized.  -Mass 12:00 6 cm FN, measuring 6 x 6 x 4 mm. Decreased size. Ribbon clip not well visualized.  -Mass 12:00 6 cm FN measuring 4 x 3 x 2 mm, stable in size.  - Mass 2:00 4 cm FN measuring 4 x 3 x 2 mm, this is consistent with a simple cyst.  There is no axillary lymphadenopathy on the right.     Left breast:  -Mass 11:00 5 cm FN measuring 15 x 8 x 15 mm with coil clip. This has increased slightly in size  compared to prior US.  -Mass 12:00 3 cm FN measuring 7 x 6 x 7 mm, stable in size.  -Mass 1:00 1 cm FN measuring 4 x 5 x 3 mm, stable in size.  There is an axillary lymph node in the left axilla with cortical thickness measuring up to 6 mm, stable.    G2P2  Menopausal status: Postmenopausal  Hormone therapy use: None  Ashkenazi Jewish: No    Past Medical History:   Diagnosis Date    Breast cancer (HCC/RAF) 12/20/2018     No past surgical history on file.  Current Outpatient Medications   Medication Sig    Albuterol Sulfate AEPB Takes 1 to 2 puffs every 4 hours as needed for wheezing or SOB Inhale Takes 1 to 2 puffs every 4 hours as needed for wheezing or SOB .Marland Kitchen    aspirin 81  mg EC tablet Take 1 tablet (81 mg total) by mouth daily.    azaTHIOprine 50 mg tablet Take one and one-half tablet (75 mg total) by mouth two (2) times daily.    clonazePAM 1 mg tablet 1 mg in pm.    dapagliflozin 10 mg tablet Take 1 tablet (10 mg total) by mouth daily.    DEXILANT 60 MG DR capsule TAKE 1 CAPSULE (60 MG TOTAL) BY MOUTH DAILY    DULoxetine 20 mg DR capsule Take 3 capsules (60 mg total) by mouth daily.    Evolocumab (REPATHA SURECLICK) 140 MG/ML SOAJ Inject 1 pen. under the skin every fourteen (14) days.    exemestane 25 mg tablet Take 1 tablet (25 mg total) by mouth daily.    fluticasone-vilanterol (BREO ELLIPTA) 100-25 mcg/inh inhaler Inhale 1 puff daily.    GAMUNEX-C 20 GM/200ML infusion     losartan 100 mg tablet Take 1 tablet (100 mg total) by mouth daily.    [EXPIRED] methenamine hippurate 1 g tablet Take 1 tablet (1 g total) by mouth two (2) times daily.    montelukast 10 mg tablet Take 1 tablet (10 mg total) by mouth as needed for.    PYRIDOSTIGMINE 60 mg tablet TAKE 1 TABLET (60 MG TOTAL) BY MOUTH THREE (3) TIMES DAILY AS NEEDED    TRIMETHOPRIM 100 mg tablet TAKE 1 TABLET (100 MG TOTAL) BY MOUTH DAILY    WELLBUTRIN SR 200 MG 12 hr tablet TAKE 1 TABLET (200 MG TOTAL) BY MOUTH TWO (2) TIMES DAILY    [DISCONTINUED] Evolocumab (REPATHA SURECLICK) 140 MG/ML SOAJ Inject 1 pen. under the skin every fourteen (14) days.    [DISCONTINUED] losartan 50 mg tablet Take 1 tablet (50 mg total) by mouth daily.     No current facility-administered medications for this visit. Allergies   Allergen Reactions    Beta Adrenergic Blockers Other (See Comments)     Avoid d/t Myasthenia Gravis  Avoid d/t Myasthenia Gravis      Levofloxacin Other (See Comments)     Flare of Myasthenia Gravis    Macrolides And Ketolides Other (See Comments)     Avoid d/t Myasthenia Gravis  Use with caution d/t Myasthenia Gravis  Use with caution d/t Myasthenia Gravis      Sulfa Antibiotics Other (See Comments)     May possibly have caused deafness in one ear  May possibly have caused deafness in one ear  other      Botulinum Toxins Other (See Comments)     Muscle weakness r/t Myasthenia Gravis  Muscle weakness r/t Myasthenia Gravis      Statins Other (See Comments)     Muscle aches  Muscle aches  Muscle aches      Plastic Tape Itching and Other (See Comments)     Turns the skin red where applied     No family history on file.  Social History     Tobacco Use    Smoking status: Former     Types: Cigarettes     Quit date: 1985     Years since quitting: 39.0    Smokeless tobacco: Former   Substance Use Topics    Alcohol use: Not Currently    Drug use: Never      Physical Examination:  Vitals: BP 158/69 (BP Location: Right arm, Patient Position: Sitting)  ~ Pulse 87  ~ Temp 36.1 ?C (96.9 ?F) (Forehead)  ~ Ht 5' 5'' (1.651 m)  ~ Wt 138 lb 6.4  oz (62.8 kg)  ~ SpO2 95%  ~ BMI 23.03 kg/m?    General: Alert, no apparent distress.  Breast inspection: The patient is examined in the seated upright and supine positions. No visible masses, skin fixations, areas of skin edema or erythema, nipple/areolar lesions, or nipple retractions.  Breast examination: Mild diffuse fibroglandular nodularity. No dominant or distinct masses, skin fixations, nipple discharge or areas of focal tenderness bilaterally.  Abdomen: Soft, non-tender, no hepatosplenomegaly or masses appreciated.   Lymph Nodes: No cervical, supraclavicular, or axillary adenopathy bilaterally.    Impression and Plan:  Ary Rudnick is a 87 y.o. woman with multifocal IDC/DCIS in the right breast and IDC in the left breast.    If surgery is pursued, I'm still recommending a right mastectomy due to the extent of the known disease and the additional calcifications. We could consider doing a lumpectomy for the left breast since there's only a small tumor left. However, since her disease is fairly stable and she has the risk of her age, co-morbidities, and anaesthetic risks, I am recommending continued endocrine therapy unless there is a dramatic change in her disease status. I would like to get a breast MRI in March 2024 to monitor the status of her disease, and I'll be following up with Dr. Halford Chessman for further management of her care.     Ms. Forrest understands everything we discussed, and all of her questions were answered.    Total face-to-face and non-face-to-face encounter time today = 30 minutes, which included chart preparation, review of relevant breast imaging studies and labs, review of primary care and oncology provider notes, examination, counseling, order entry, case discussion with multidisciplinary providers, documentation, and coordination of additional care.    AttestationSherren Mocha, have scribed for [employer]. Janee Morn, MD with the documentation for Zaryiah Barz on 03/07/2022.    I have reviewed this note, scribed by Isa Rankin and attest that it is an accurate representation of my H & P and other events of the outpatient visit except if otherwise noted.        Jove Beyl K. Janee Morn, M.D.  Assistant Professor of Surgery  [employer] at [facility]  [place] office: [phone]  [facility] office: [phone]

## 2022-03-08 DIAGNOSIS — C50811 Malignant neoplasm of overlapping sites of right female breast: Secondary | ICD-10-CM

## 2022-03-08 DIAGNOSIS — C50812 Malignant neoplasm of overlapping sites of left female breast: Secondary | ICD-10-CM

## 2022-03-08 DIAGNOSIS — Z170 Estrogen receptor positive status [ER+]: Secondary | ICD-10-CM

## 2022-03-13 ENCOUNTER — Ambulatory Visit: Payer: BLUE CROSS/BLUE SHIELD | Attending: Geriatric Medicine

## 2022-03-19 MED ORDER — CLONAZEPAM 1 MG PO TABS
ORAL_TABLET
Start: 2022-03-19 — End: ?

## 2022-03-20 ENCOUNTER — Ambulatory Visit: Payer: BLUE CROSS/BLUE SHIELD

## 2022-03-20 MED ORDER — CLONAZEPAM 1 MG PO TABS
ORAL_TABLET | 0 refills | Status: AC
Start: 2022-03-20 — End: 2022-04-01

## 2022-03-21 ENCOUNTER — Inpatient Hospital Stay: Payer: BLUE CROSS/BLUE SHIELD | Attending: Hematology & Oncology

## 2022-03-21 ENCOUNTER — Ambulatory Visit: Payer: BLUE CROSS/BLUE SHIELD

## 2022-03-21 DIAGNOSIS — R921 Mammographic calcification found on diagnostic imaging of breast: Secondary | ICD-10-CM

## 2022-03-21 MED ORDER — CLONAZEPAM 1 MG PO TABS
ORAL_TABLET | 3 refills | Status: AC
Start: 2022-03-21 — End: ?

## 2022-03-26 LAB — Tissue Exam: LAB AP GROSS DESCRIPTION: 0

## 2022-03-27 ENCOUNTER — Ambulatory Visit: Payer: BLUE CROSS/BLUE SHIELD | Attending: Geriatric Medicine

## 2022-03-27 ENCOUNTER — Ambulatory Visit: Payer: BLUE CROSS/BLUE SHIELD

## 2022-03-27 DIAGNOSIS — N184 Chronic kidney disease, stage 4 (severe): Secondary | ICD-10-CM

## 2022-03-27 DIAGNOSIS — E1122 Type 2 diabetes mellitus with diabetic chronic kidney disease: Secondary | ICD-10-CM

## 2022-03-27 DIAGNOSIS — N1832 Type 2 diabetes mellitus with stage 3b chronic kidney disease, without long-term current use of insulin (HCC/RAF): Secondary | ICD-10-CM

## 2022-03-27 DIAGNOSIS — G959 Disease of spinal cord, unspecified: Secondary | ICD-10-CM

## 2022-03-27 DIAGNOSIS — G7000 Myasthenia gravis without (acute) exacerbation: Secondary | ICD-10-CM

## 2022-03-27 DIAGNOSIS — Z23 Encounter for immunization: Secondary | ICD-10-CM

## 2022-03-28 ENCOUNTER — Telehealth: Payer: BLUE CROSS/BLUE SHIELD

## 2022-03-28 NOTE — Progress Notes
OUTPATIENT GERIATRICS CLINIC NOTE    PATIENT:  [name]   MRN:  [id number]  DOB:  August 28, 1935  DATE OF SERVICE:  03/27/2022  PRIMARY CARE PHYSICIAN: York Ram., MD    CHIEF COMPLAINT:   Chief Complaint   Patient presents with    Myasthenia gravis with exacerbation (HCC/RAF)     Follow up       [facility]:  [facility]    [facility]:    Chaperone status:  No data recorded    HISTORY OF PRESENT ILLNESS     [name] is a 87 y.o. female who presents today for *follow up**. Patient is accompanied by *no one**. History today is per the patient,and review of available recent records in [facility] and Care Everywhere.     Patient is a(n) [x]  reliable []  unreliable historian and additional collateral information was obtained during the visit today from:     Feels ok.  .          Per chart review, pertinent medical history:  Past Medical History:   Diagnosis Date    Breast cancer (HCC/RAF) 12/20/2018     No past surgical history on file.    ALLERGIES     Allergies   Allergen Reactions    Beta Adrenergic Blockers Other (See Comments)     Avoid d/t Myasthenia Gravis  Avoid d/t Myasthenia Gravis      Levofloxacin Other (See Comments)     Flare of Myasthenia Gravis    Macrolides And Ketolides Other (See Comments)     Avoid d/t Myasthenia Gravis  Use with caution d/t Myasthenia Gravis  Use with caution d/t Myasthenia Gravis      Sulfa Antibiotics Other (See Comments)     May possibly have caused deafness in one ear  May possibly have caused deafness in one ear  other      Botulinum Toxins Other (See Comments)     Muscle weakness r/t Myasthenia Gravis  Muscle weakness r/t Myasthenia Gravis      Statins Other (See Comments)     Muscle aches  Muscle aches  Muscle aches      Plastic Tape Itching and Other (See Comments)     Turns the skin red where applied        MEDICATIONS     Personally reviewed.    Medications that the patient states to be currently taking   Medication Sig    Albuterol Sulfate AEPB Takes 1 to 2 puffs every 4 hours as needed for wheezing or SOB Inhale Takes 1 to 2 puffs every 4 hours as needed for wheezing or SOB .Marland Kitchen    aspirin 81 mg EC tablet Take 1 tablet (81 mg total) by mouth daily.    azaTHIOprine 50 mg tablet Take one and one-half tablet (75 mg total) by mouth two (2) times daily.    clonazePAM 1 mg tablet TAKE 1 TABLET BY MOUTH AT NIGHT.    dapagliflozin 10 mg tablet Take 1 tablet (10 mg total) by mouth daily.    DEXILANT 60 MG DR capsule TAKE 1 CAPSULE (60 MG TOTAL) BY MOUTH DAILY    DULoxetine 20 mg DR capsule Take 3 capsules (60 mg total) by mouth daily.    Evolocumab (REPATHA SURECLICK) 140 MG/ML SOAJ Inject 1 pen. under the skin every fourteen (14) days.    exemestane 25 mg tablet Take 1 tablet (25 mg total) by mouth daily.    fluticasone-vilanterol (BREO ELLIPTA) 100-25 mcg/inh  inhaler Inhale 1 puff daily.    GAMUNEX-C 20 GM/200ML infusion     losartan 100 mg tablet Take 1 tablet (100 mg total) by mouth daily.    montelukast 10 mg tablet Take 1 tablet (10 mg total) by mouth as needed for.    PYRIDOSTIGMINE 60 mg tablet TAKE 1 TABLET (60 MG TOTAL) BY MOUTH THREE (3) TIMES DAILY AS NEEDED    TRIMETHOPRIM 100 mg tablet TAKE 1 TABLET (100 MG TOTAL) BY MOUTH DAILY    WELLBUTRIN SR 200 MG 12 hr tablet TAKE 1 TABLET (200 MG TOTAL) BY MOUTH TWO (2) TIMES DAILY       SOCIAL HISTORY     Social History     Social History Narrative    Not on file        FUNCTIONAL STATUS     BADLs:  IADLs:    Ambulates with   Falls in past year:  Afraid of falling:    GERIATRIC REVIEW OF SYSTEMS     Vision:    []   glasses     []  legally blind  Hearing: []  hearing aids    Nutrition: []  normal   []  impaired  []  vegan  []  vegetarian  []  low salt  []  low-carb   Swallowing: []  impaired   Dentures:   []  yes    Depression:  []  yes   Cognition:     Incontinence: [] urine  []  fecal  []  urine and fecal      ADVANCED CARE PLANNING     Advanced directives on file: []  No  []   Yes ? Completed:     Medical DPOA on file: []   Yes ?   []   No ? Patient designates ** * to be their surrogate medical decision-maker.         HEALTH CARE MAINTENANCE     IMMUNIZATIONS:   Immunization History   Administered Date(s) Administered    COVID-19, mRNA, (Pfizer - Purple Cap) 30 mcg/0.3 mL 03/11/2019, 04/01/2019, 10/29/2019    COVID-19, mRNA, bivalent (Pfizer) 30 mcg/0.3 mL (12y and up) 11/25/2020, 07/05/2021    COVID-19, mRNA, tris-sucrose (Pfizer - [employer]) 30 mcg/0.3 mL 06/20/2020    DTaP 12/31/2015    influenza vaccine IM quadrivalent (Fluzone Quad) MDV (68 months of age and older) 11/26/2016    influenza vaccine IM quadrivalent adjuvanted (FluAD Quad) (PF) SYR (63 years of age and older) 11/05/2018, 12/01/2019, 11/25/2020    influenza vaccine IM quadrivalent high dose (Fluzone High Dose Quad) (PF) SYR (102 years of age and older) 11/22/2021    influenza, unspecified formulation 05/10/2014, 12/29/2015, 11/26/2016, 04/01/2019, 04/01/2019, 12/01/2019, 12/01/2019, 12/01/2019, 12/01/2019, 11/25/2020, 11/25/2020    pneumococcal conjugate vaccine 20-valent (Prevnar 20) 07/05/2021    pneumococcal polysaccharide vaccine 23-valent (Pneumovax) 01/10/2019       Mammogram:  DXA:  PAP:  Colonoscopy:      PHYSICAL EXAM     BP 120/78  ~ Pulse 87  ~ Temp 36.6 ?C (97.8 ?F) (Temporal)  ~ Resp 18  ~ Ht 5' 5'' (1.651 m)  ~ Wt 138 lb (62.6 kg)  ~ SpO2 93% Comment: RA ~ BMI 22.96 kg/m?   Wt Readings from Last 3 Encounters:   03/27/22 138 lb (62.6 kg)   03/07/22 138 lb 6.4 oz (62.8 kg)   03/06/22 137 lb 6.4 oz (62.3 kg)         System Check if normal Positive or additional negative findings   GEN  [x]  NAD  Eyes  []  Conj/Lids []  Pupils  []  Fundi   []  Sclerae []  EOM     ENT  []  External ears   []  Otoscopy   []  Gross Hearing    []   External nose   []  Nasal mucosa   []  Lips/teeth/gums    []  Oropharynx    []  Mucus membranes      Neck  []  Inspection/palpation    []  Thyroid     Resp  []  Effort    []  Auscultation       CV  []  Rhythm/rate   []  Murmurs   []  Edema   []  JVP non-elevated    Normal pulses:   []  Radial []  Femoral  []  Pedal     Breast  []  Inspection []  Palpation     GI  []  Bowel sounds    []  Nontender   []  No distension    []  No rebound or guarding   []  No masses   []  Liver/spleen    []  Rectal     GU  F:  []  External []  vaginal wall         []  Cervix     []  mucus        []  Uterus    []  Adnexa   M:  []  Scrotum []  Penis         []  Prostate     Lymph  []  Cervical []  supraclavicular     []  Axillae   []  Groin/inguinal     MSK []  Gait  []  Back     Specify site examined:    []  Inspect/palp []  ROM   []  Stability []  Strength/tone []  Used arms to push up from seated to standing position   Assistive device:  []  single point cane []  quad cane  []  FWW []  rollator walker      Skin  []  Inspection []  Palpation     Neuro  []  Alert and oriented     []  CN2-12 intact grossly   []  DTR      []  Muscle strength      []  Sensation   []  Pronator drift   []  Finger to Nose/Heel to Shin   []  Romberg     Psych  []  Insight/judgement     []  Mood/affect    []  Gross cognition            LABS/STUDIES     LABS:  Lab Results   Component Value Date    WBC 5.37 01/03/2022    WBC 4.7 10/18/2021    WBC 4.94 09/01/2021    HGB 12.8 01/03/2022    HGB 12.7 10/18/2021    HGB 11.8 09/01/2021    MCV 99.7 (H) 01/03/2022    PLT 339 01/03/2022    PLT 379 (H) 10/18/2021    PLT 342 09/01/2021     Lab Results   Component Value Date    NA 139 01/03/2022    NA 137 10/18/2021    NA 139 09/20/2021    K 4.6 01/07/2022    K 5.0 01/03/2022    K 4.59 10/18/2021    CREAT 1.70 (H) 01/07/2022    CREAT 2.00 (H) 01/03/2022    CREAT 1.6 (H) 10/18/2021    GFRESTNOAA 29 06/09/2020    GFRESTNOAA 28 05/18/2020    GFRESTNOAA 30 03/23/2020    GFRESTAA 34 06/09/2020    GFRESTAA 33 05/18/2020    GFRESTAA 35 03/23/2020    CALCIUM 10.1 01/03/2022  CALCIUM 9.7 10/18/2021    CALCIUM 9.6 09/20/2021     Lab Results   Component Value Date    ALT 12 01/07/2022    ALT 14 01/03/2022    AST 26 01/03/2022    AST 16 10/18/2021    ALKPHOS 81 01/03/2022 BILITOT 0.3 01/03/2022    ALBUMIN 4.2 01/03/2022    ALBUMIN 3.8 10/18/2021     Lab Results   Component Value Date    TSH 2.3 01/03/2022    TSH 2.7 09/01/2021    TSH 1.5 03/16/2021     Lab Results   Component Value Date    HGBA1C 5.2 09/01/2021    HGBA1C 5.1 03/16/2021    HGBA1C 5.2 03/23/2020     Lab Results   Component Value Date    CHOL 226 01/07/2022    CHOL 237 09/01/2021    CHOL 199 06/03/2021     Lab Results   Component Value Date    CHOLDLQ 144 (H) 01/07/2022    CHOLDLQ 147 (H) 09/01/2021    CHOLDLQ 128 (H) 06/03/2021     Lab Results   Component Value Date    VITD25OH 76 10/18/2021     Lab Results   Component Value Date    FE 87 07/14/2019    FERRITIN 57 07/14/2019    FOLATE 9.6 11/25/2019    TIBC 356 07/14/2019     Lab Results   Component Value Date    VITAMINB12 3,994 (H) 11/25/2019    VITAMINB12 217 (L) 09/22/2019     Lab Results   Component Value Date    BNP 156 (H) 06/03/2021    BNP 92 09/22/2019     No results found for: ''PSATOTAL''      STUDIES:    EKG  This data element was independently reviewed and interpreted by me     NSR NO ST or T changes    XR CHEST PA LAT 2V  September 22, 2019   COMPARISON: [facility] December 17, 2018     History: sob     FINDINGS:     Lungs: Clear  Heart/aorta: Normal heart size. The thoracic aorta is mildly calcified, tortuous and ectatic.  Adenopathy: None  Pleura: No effusion  Bones and Chest wall: No acute bony or body wall  findings. Osteopenia and bony maturational changes. Right upper quadrant cholecystectomy clips stable from October 2020        IMPRESSION:     No acute findings or abnormalities related to provided history.      MRI C spine  May 03, 2021  IMPRESSION:  Redemonstrated congenital cervical spinal canal stenosis with superimposed multilevel degenerative changes, which are mildly progressive compared to prior as above. No evidence of cord compression or cord signal abnormality.      Echo  Jan 2023  1. Normal left ventricular size.  2. There are LV regional wall motion abnormalities.  3. Left ventricular ejection fraction is approximately 35 to 40%.  4. Abnormal LV diastolic function.  5. There are no prior studies on this patient for comparison purposes.  ASSESSMENT and PLAN     [name] is a 87 y.o. female who presents today for *follow up**.    #Major Depression--active--self d/ced duloxetine.  Rechallenge.  Tolerating Duloxetine.    #HFrEF--EF 35-40%noted on echo-- 2023--Not Worsening.  Chronic.  Will monitor and treat underlying risk factors with medical and lifestyle interventions as warranted by balance between benefits and burdens.   #CAD--discussed medical management alone with  shared decision making model May 11, 2021.  Declines staint  #Cervical myelopathy--monitored by neuro--chronic--re-imaged on MRI as above.  CTM.  #HTN--Increased nifedipine CR 60 mg daily. Continue losartan 50 mg.  BP Better.    #CKD--stage 4--chronic--CTM--control BP as tolerated by symptoms of orthostasis and fatigue  #CAD--patient leaning towards medical management.  #Aortic Calcification--noted on chest imaging     September 22, 2019        --Not Worsening.  Chronic.  Will monitor and treat underlying risk factors with medical and lifestyle interventions as warranted by balance between benefits and burdens.  #allergic rhinitis--trial flonase  #Macrocytosis--  #Deconditioning--initiate home PT  #GERD--s/p Nisan fundoplication  #COPD--mixed--per PFT's from [name].--albuterol prn.  May resume other inhalders.  #s/p rectocele repair  #s/p cholecystectomy  #Immunosupressed status--medication related.  Chronic. CTM  #Nocturnal hypoxemia--presumably related to MG crisis but has history of COPD.  Will order nocturnal home O2 study and consider PFT's/Pulmonary evaluation once settled down.  May need to restart BREO inhaler.  #Myasthenia Gravis--s/p plasmapheresis x5 October 2020, no Thymoma, on Azathioprine 150 daily and mestinon 60 tid.  Saw Dr. Enedina Finner at [facility].  Was considering Soliris. Ordering IVIG and MRI.  Encourage ongoing neurology follow up.   #IBS--titrate miralax to comfort.    #Interstitial cystitis--on daily Trimethoprim 100 for UTI prophylaxis.  Using bladder instillations prn. OFF topical estrogen due to advice from neurologist re: MG.  To see Dr. Lorenz Coaster.  Suspect recent flare related to breast CA re-diagnosis.  Doubt pseudomonas bactiuria is pathogen at present.  WIll complete current meropenum.    #Allergic rhinitis--previously on nasal steroids.  #Breast CA--under care.  Patient continues to decline surgery.  Wondering about additional treatment options.  Will direct to med-onc for discussion.  #DM with CKD--stabe 3b--based on labs from [name].  FOllow here.  If BP permits, may consider ARB BUT patient with h/o angioedema so will ONLY initiate this agent with great caution.  Refer to Ophthalmology.  #OA knees--previously getting hyaluronic acid injections.  #Anxiety  #Benzodiazepine Dependence--uncomplicated--chronic.  Encourage patient to decrease intake--ideally to abstaining but will work towards this goal over time.  Not likely to achieve this given ongoing concerns re: breast ca.  #Fibromyalgia--duloxetine as above.   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  #Statin intolerance      FOLLOW-UP     RTC     Future Appointments   Date Time Provider Department Center   04/03/2022  1:00 PM Odetta Pink., MD, PhD [facility] B200 [place]/Cen   04/10/2022  2:00 PM [facility] [place]/Cen   06/05/2022  2:00 PM SM 2020 ECHO 03 CARDIOLOGY CARDIAC IMAGING CI SM2020 [facility]   06/12/2022  2:00 PM Clelia Croft, MD CRD DIS 2020 [facility]         The above plan of care, diagnosis, orders, and follow-up were discussed with the patient and/or surrogate. Questions related to this recommended plan of care were answered.    ANALYSIS OF DATA (Needs to meet 1 category for moderate and 2 categories for high LOS)     I have:     Category 1 (Needs 3 for moderate and high LOS)     []  Reviewed []  1 []  2 []  ? 3 unique laboratory, radiology, and/or diagnostic tests noted below    Test/Study:  on date .    []  Reviewed []  1 []  2 []  ? 3 prior external notes and incorporated into patient assessment    I  reviewed Dr. 's note in specialty  from date .    []  Discussed management or test interpretation with external provider(s) as noted      []  Ordered []  1 []  2 []  ? 3 unique laboratory, radiology, and/or diagnostic tests noted in A&P    []  Obtained history from independent historian:       Category 2  []  Independently interpreted the test    Category 3  []  Discussed management or test interpretation with external provider(s) as noted      INTERACTION COMPLEXITY and SOCIAL DETERMINANTS of HEALTH       PROBLEM COMPLEXITY   []  New problem with uncertain diagnosis or prognosis (moderate)   []  Multiple stable chronic problems (moderate)   []  Chronic problem not stable - not controlled, symptomatic, or worsening (moderate)   []  Severe exacerbation of chronic problem (high)   []  New or chronic problem that poses threat to life or bodily function (high)     MANAGEMENT COMPLEXITY   []  Old or external/outside records reviewed   []  Discussion with alternate (proxy) if patient with impaired communication / comprehension ability (e.g., dementia, aphasia, severe hearing loss).   []  Repeated questions (or disagreement) between patient and/among caregivers/family during the visit.  []  Caregiver/patient emotions/behavior/beliefs interfering with implementation of treatment plan.  []  Independent interpretation of test (EKG, Chest XRay)   []  Discussion of case with a consultant physician     RISK LEVEL   []  Prescription drug management (moderate)   []  Minor surgery with CV risk factors or elective major surgery (moderate)   []  Dx or Rx significantly limited by SDoH (inadequate housing, living alone, poor health care access, inappropriate diet; low literacy) (moderate)   []  Major surgery - elective with CV risk factors or emergent (high)   []  Need for hospitalization (high)   []  New DNR or de-escalation of care (high)      SDoH  The diagnosis or treatment of said conditions is significantly limited by the following social determinants of health:  []  Z59.0 Homelessness  []  Z59.1 Inadequate housing  []  Z59.2 Discord with neighbors, lodgers and landlord  []  Z60.2 Problems related to living alone  []  Z59.8 Other problems related to housing and economic circumstances  []  Z59.4 Lack of adequate food and safe drinking water  []  Z59.6 Low income  []  Z59.7 Insufficient social insurance and welfare support  []  Z59.9 Problems related to housing and economic circumstances, unspecified  []  Z75.3 Unavailability and inaccessibility of health care facilities  []  Z75.4 Unavailability and inaccessibility of other helping agencies  []  Z72.4 Inappropriate diet and eating habits  []  Z62.820 Parent-biological child conflict  []  Z63.8 Other specified problems related to primary support group  []  Z55.0 Illiteracy and low level literacy   []  Z56.9 Unspecified problems related to employment        If Billing Based on Time:     I performed the following items on the day of service:    [x]  Preparing to see the patient (e.g., review of tests)  [x]  Obtaining and/or reviewing separately obtained history   [x]  Performing a medically appropriate examination and/or evaluation   [x]  Counseling and educating the patient/family/caregiver   [x]  Ordering medications, tests, or procedures  [x]  Referring and communicating with other healthcare professionals (when not separately reported)  [x]  Documenting clinical information in the EHR  []  Independently interpreting results and communicating results to patient/family/caregiver    I spent the following total amount of time on these  tasks on the day of service:  New Patient     Established Patient  []  15-29 minutes - 99202    []  up to 9 minutes - 99211  []  30-44 minutes - 99203     []  10-19 minutes - 99212 []  45-59 minutes - 99204      []  20-29 minutes - 99213   []  60-74 minutes - 99205   [x]  30- minutes - 99214         []  40-55 minutes - 99215    []  I spent an additional ** * 15-minute-increment(s) for a total of * ** minutes on these tasks on the day of service. [phone] for each additional 15 minutes.)    Author: York Ram, MD 03/27/2022    [phone] billed today:    I have a longitudinal care relationship with this patient and am the focal point of ongoing patient care related to the serious chronic and complex condition(s) assessed today as noted above in the note.   I assume responsibility of ongoing management of said condition(s) over time.

## 2022-03-28 NOTE — Telephone Encounter
Message to Practice/Provider      Message: [name] with [facility] calling to request most recent clinical notes to submit PA for patient's IVIG. Please fax to below, thank you.    Fax # [phone]    Return call is not being requested by the patient or caller.    Patient or caller has been notified of the turnaround time of 1-2 business day(s).

## 2022-03-28 NOTE — Telephone Encounter
Dear Dr. Valerie Salts,    Your patient [name] underwent a breast biopsy on 03/21/2022. The pathology result was:    FINAL DIAGNOSIS   Date Value Ref Range Status   03/21/2022   Final      BREAST, RIGHT, CALCIFICATIONS, 10:00, 2 CM FROM NIPPLE (MAMMO-GUIDED CORE BIOPSY):  - Atypical ductal hyperplasia (ADH)   - Background breast with fibrosis, microcysts, and apocrine metaplasia  - Calcifications are present within ADH, apocrine metaplasia, and benign stroma  - No carcinoma identified (multiple deeper levels examined)    Note: Case reviewed at [facility] breast pathology consensus conference on 03/23/2022.   Marland Kitchen         [facility] REPORT   Date Value Ref Range Status   03/21/2022   Final    GENERAL IMMUNOHISTOCHEMISTRY REPORT    BLOCK:  A1        FIXATIVE:  FORMALIN     ANTIBODY/PROBE: RESULT/COMMENT    P63   Positive/no loss of myoepithelial layer  SMMHC  Positive/no loss of myoepithelial layer    INTERPRETATION:  See final diagnosis      Note: IF NOT OTHERWISE DESIGNATED The immunoperoxidase stain reported above was developed and its performance characteristics determined by [facility].  It has not been cleared or approved by the U.S. Food and Drug Administration, although such approval is not required for analyte-specific reagents of this type.  Appropriate controls are included for each case.    *Performed at [facility]    #Performed at [place]            This has been reviewed by the Radiologist and is concordant and high risk with the imaging. It is recommended for the patient to have a  surgical consultation for this finding. Continue to recommend surgical and oncologic management of known malignancy.     Please let us know if you have any questions or if we can assist in any way. Thank you!    Ladoris Gene, RN, BSN  [facility] Breast Radiology

## 2022-03-30 ENCOUNTER — Telehealth: Payer: BLUE CROSS/BLUE SHIELD

## 2022-03-30 ENCOUNTER — Ambulatory Visit: Payer: BLUE CROSS/BLUE SHIELD

## 2022-03-30 DIAGNOSIS — D849 Immunodeficiency, unspecified: Secondary | ICD-10-CM

## 2022-03-30 DIAGNOSIS — U071 COVID-19 in immunocompromised patient (HCC/RAF): Secondary | ICD-10-CM

## 2022-03-30 NOTE — Telephone Encounter
Message to Practice/Provider      Message: Lovena Le with [facility] called to follow up regarding clinical notes required for infusion P.A. Thank you.     Fax # [phone]     Return call is not being requested by the patient or caller.    Patient or caller has been notified of the turnaround time of 1-2 business day(s).

## 2022-03-31 MED ORDER — MOLNUPIRAVIR 200 MG PO CAPS
800 mg | Freq: Two times a day (BID) | ORAL | 0 refills | 5.00 days | Status: AC
Start: 2022-03-31 — End: ?
  Filled 2022-03-31: qty 40, 5d supply, fill #0

## 2022-03-31 NOTE — Telephone Encounter
Outpatient Geriatric Telephone Visit     Patient: [name]  MRN: [id number]  PCP: Molinda Bailiff., MD  Date: 03/30/2022      Patient's concerns:    +COVID  Hoarse  No SOB  Cough      Assessment and Plan:  +COVID  Problems:  +COVID    Plan:  Initeractoins reviewed  Molnupiravir given  Discussed with patient and daughter        # advice given about precautions for coronavirus    Future Appointments   Date Time Provider Lena   04/03/2022  1:00 PM Clare Charon., MD, PhD [facility] [address] [place]/Cen   04/10/2022  2:00 PM [place]/Cen   05/22/2022  1:20 PM Kathi Simpers., MD HEM/ONC [place] Mon   05/29/2022  3:20 PM Molinda Bailiff., MD GERI IMS [address] [place]/Cen   06/05/2022  2:00 PM SM 2020 ECHO 03 CARDIOLOGY CARDIAC IMAGING CI SM2020 [facility]   06/12/2022  2:00 PM Orbie Hurst, MD CRD DIS 2020 [facility]         Total time spent on this telephone encounter:  '[]'$   5-10 minutes (99441)  '[]'$   11-20 minutes (99442)  '[]'$   >21 minutes (37858)      [All the following must be checked off in order to bill for this service.]    '[]'$   This service is not related to a problem addressed by an E/M encounter in the prior 7 days  '[]'$   This service will not result in an office or telemedicine visit within the next 24 hours or soonest available appointment.   '[]'$   This service was initiated by the patient.   '[]'$   This patient is an established patient in my practice.     The patient has been notified that this visit will result in an E/M charge.    Signed: Molinda Bailiff, MD

## 2022-04-03 ENCOUNTER — Ambulatory Visit: Payer: BLUE CROSS/BLUE SHIELD | Attending: Neurology

## 2022-04-04 NOTE — Telephone Encounter
Message to Practice/Provider      Message: [name] with [facility] is calling regarding below messages. Please fax clinicals and medical records to [phone]    Return call is not being requested by the patient or caller.    Patient or caller has been notified of the turnaround time of 1-2 business day(s).

## 2022-04-06 NOTE — Telephone Encounter
Faxed last two consult notes of Dr. Jefm Bryant to [facility]

## 2022-04-06 NOTE — Telephone Encounter
PDL Call to Clinic    Reason for Call: [place] with [facility] called in to follow up on messages below, states they are still waiting on clinicals for Pt. Pt is scheduled Monday.     Appointment Related?  []$  Yes  [x]$  No     If yes;  Date:  Time:    Call warm transferred to PDL: []$  Yes  [x]$  No    Call Received by Clinic Representative: no answer     If call not answered/not accepted, call received by Patient Services Representative:  Mateo Flow

## 2022-04-10 ENCOUNTER — Ambulatory Visit: Payer: BLUE CROSS/BLUE SHIELD | Attending: Rheumatology

## 2022-04-10 ENCOUNTER — Telehealth: Payer: BLUE CROSS/BLUE SHIELD

## 2022-04-10 NOTE — Telephone Encounter
Message to Practice/Provider      Message: [name] with [facility] called to confirm fax sent yesterday morning with Cover My Meds code was received for patient's infusion. Verified fax number. Cover My Meds Code listed below. Thank you.     cover my meds code [place]    Return call is not being requested by the patient or caller.    Patient or caller has been notified of the turnaround time of 1-2 business day(s).

## 2022-04-11 ENCOUNTER — Telehealth: Payer: BLUE CROSS/BLUE SHIELD

## 2022-04-11 NOTE — Telephone Encounter
Appointment Accommodation Request      Appointment Type: Return visit    Reason for sooner request: [facility] does not schedule for Dr Claudie Leach     Date/Time Requested (If any): as soon as possible    Last seen by MD: 08/15/21    Any Symptoms:  []  Yes  [x]  No      If yes, what symptoms are you experiencing:   Duration of symptoms (how long):     Patient or caller was offered an appointment but declined.    Patient or caller was advised to seek emergency services if conditions are urgent or emergent.    Patient or caller has been notified of the turnaround time of 1-2 business (days).    Call Back Request      Reason for call back: Patient would like a call back to confirm status of her Prior Authorization that was to be sent to [employer]. PDL did not answer-please reach out to patient to confirm.     CBN: [phone]    Any Symptoms:  []  Yes  [x]  No      If yes, what symptoms are you experiencing:    Duration of symptoms (how long):    Have you taken medication for symptoms (OTC or Rx):      If call was taken outside of clinic hours:    [] Patient or caller has been notified that this message was sent outside of normal clinic hours.     [] Patient or caller has been warm transferred to the physician's answering service. If applicable, patient or caller informed to please call us back if symptoms progress.  Patient or caller has been notified of the turnaround time of 1-2 business day(s).      PDL Call to Clinic    Reason for Call: Patient is calling to schedule her return visit with Dr Claudie Leach as well as to get status on her PA for her infusion medication as [facility] is requesting PA to be sent. PDL did not answer and  patient service representative line had a long hold.     Appointment Related?  [x]  Yes  []  No     If yes;  Date: TBD  Time: TBD    Call warm transferred to PDL: []  Yes  [x]  No    Call Received by Clinic Representative: PDL did not answer    If call not answered/not accepted, call received by Patient Services Representative: long hold time-pt is okay with a call back request

## 2022-04-12 ENCOUNTER — Inpatient Hospital Stay: Admit: 2022-04-12 | Discharge: 2022-04-12 | Payer: BLUE CROSS/BLUE SHIELD | Source: Home / Self Care

## 2022-04-12 NOTE — Telephone Encounter
Spoke with daughter.  Patient recovering from [place] but became more fatigued and SOB today.  Pulse ox on room air 62%  Daughter advised to bring patient to [facility] ER  DNR/DNI confirmed on POLST.    Sign-over given to [facility] ED Charge nurse.

## 2022-04-15 NOTE — Progress Notes
OUTPATIENT GERIATRICS CLINIC NOTE    PATIENT:  [name]   MRN:  [id number]  DOB:  [birth year]  DATE OF SERVICE:  04/17/2022  PRIMARY CARE PHYSICIAN: York Ram., MD    CHIEF COMPLAINT:   No chief complaint on file.      [place] [facility]:  [employer]    Non-[place] Specialists:    Chaperone status:  No data recorded    HISTORY OF PRESENT ILLNESS     [name] is a 87 y.o. female who presents today for *follow up**. Patient is accompanied by *no one**. History today is per the patient,and review of available recent records in [facility] Connect and Care Everywhere.     Patient is a(n) [x]  reliable []  unreliable historian and additional collateral information was obtained during the visit today from:     Feels ok.  .          Per chart review, pertinent medical history:  Past Medical History:   Diagnosis Date    Breast cancer (HCC/RAF) 12/20/2018     No past surgical history on file.    ALLERGIES     Allergies   Allergen Reactions    Beta Adrenergic Blockers Other (See Comments)     Avoid d/t Myasthenia Gravis  Avoid d/t Myasthenia Gravis      Levofloxacin Other (See Comments)     Flare of Myasthenia Gravis    Macrolides And Ketolides Other (See Comments)     Avoid d/t Myasthenia Gravis  Use with caution d/t Myasthenia Gravis  Use with caution d/t Myasthenia Gravis      Sulfa Antibiotics Other (See Comments)     May possibly have caused deafness in one ear  May possibly have caused deafness in one ear  other      Botulinum Toxins Other (See Comments)     Muscle weakness r/t Myasthenia Gravis  Muscle weakness r/t Myasthenia Gravis      Statins Other (See Comments)     Muscle aches  Muscle aches  Muscle aches      Plastic Tape Itching and Other (See Comments)     Turns the skin red where applied        MEDICATIONS     Personally reviewed.    No outpatient medications have been marked as taking for the 04/17/22 encounter (Appointment) with York Ram., MD.       SOCIAL HISTORY     Social History     Social History Narrative    Not on file        FUNCTIONAL STATUS     BADLs:  IADLs:    Ambulates with   Falls in past year:  Afraid of falling:    GERIATRIC REVIEW OF SYSTEMS     Vision:    []   glasses     []  legally blind  Hearing: []  hearing aids    Nutrition: []  normal   []  impaired  []  vegan  []  vegetarian  []  low salt  []  low-carb   Swallowing: []  impaired   Dentures:   []  yes    Depression:  []  yes   Cognition:     Incontinence: [] urine  []  fecal  []  urine and fecal      ADVANCED CARE PLANNING     Advanced directives on file: []  No  []   Yes ? Completed:     Medical DPOA on file:   []   Yes ?   []   No ? Patient designates ** *  to be their surrogate medical decision-maker.         HEALTH CARE MAINTENANCE     IMMUNIZATIONS:   Immunization History   Administered Date(s) Administered    COVID-19 [employer] Fall 2023 12y and up) PF, 30 mcg/0.3 mL 03/27/2022    COVID-19, mRNA, (Pfizer - Purple Cap) 30 mcg/0.3 mL 03/11/2019, 04/01/2019, 10/29/2019    COVID-19, mRNA, bivalent (Pfizer) 30 mcg/0.3 mL (12y and up) 11/25/2020, 07/05/2021    COVID-19, mRNA, tris-sucrose (Pfizer - [employer]) 30 mcg/0.3 mL 06/20/2020    DTaP 12/31/2015    influenza vaccine IM quadrivalent (Fluzone Quad) MDV (50 months of age and older) 11/26/2016    influenza vaccine IM quadrivalent adjuvanted (FluAD Quad) (PF) SYR (90 years of age and older) 11/05/2018, 12/01/2019, 11/25/2020    influenza vaccine IM quadrivalent high dose (Fluzone High Dose Quad) (PF) SYR (20 years of age and older) 11/22/2021    influenza, unspecified formulation 05/10/2014, 12/29/2015, 11/26/2016, 04/01/2019, 04/01/2019, 12/01/2019, 12/01/2019, 12/01/2019, 12/01/2019, 11/25/2020, 11/25/2020    pneumococcal conjugate vaccine 20-valent (Prevnar 20) 07/05/2021    pneumococcal polysaccharide vaccine 23-valent (Pneumovax) 01/10/2019       Mammogram:  DXA:  PAP:  Colonoscopy:      PHYSICAL EXAM     There were no vitals taken for this visit.  Wt Readings from Last 3 Encounters:   03/27/22 138 lb (62.6 kg)   03/07/22 138 lb 6.4 oz (62.8 kg)   03/06/22 137 lb 6.4 oz (62.3 kg)         System Check if normal Positive or additional negative findings   GEN  [x]  NAD     Eyes  []  Conj/Lids []  Pupils  []  Fundi   []  Sclerae []  EOM     ENT  []  External ears   []  Otoscopy   []  Gross Hearing    []   External nose   []  Nasal mucosa   []  Lips/teeth/gums    []  Oropharynx    []  Mucus membranes      Neck  []  Inspection/palpation    []  Thyroid     Resp  []  Effort    []  Auscultation       CV  []  Rhythm/rate   []  Murmurs   []  Edema   []  JVP non-elevated    Normal pulses:   []  Radial []  Femoral  []  Pedal     Breast  []  Inspection []  Palpation     GI  []  Bowel sounds    []  Nontender   []  No distension    []  No rebound or guarding   []  No masses   []  Liver/spleen    []  Rectal     GU  F:  []  External []  vaginal wall         []  Cervix     []  mucus        []  Uterus    []  Adnexa   M:  []  Scrotum []  Penis         []  Prostate     Lymph  []  Cervical []  supraclavicular     []  Axillae   []  Groin/inguinal     MSK []  Gait  []  Back     Specify site examined:    []  Inspect/palp []  ROM   []  Stability []  Strength/tone []  Used arms to push up from seated to standing position   Assistive device:  []  single point cane []  quad cane  []  FWW []  rollator walker  Skin  []  Inspection []  Palpation     Neuro  []  Alert and oriented     []  CN2-12 intact grossly   []  DTR      []  Muscle strength      []  Sensation   []  Pronator drift   []  Finger to Nose/Heel to Shin   []  Romberg     Psych  []  Insight/judgement     []  Mood/affect    []  Gross cognition            LABS/STUDIES     LABS:  Lab Results   Component Value Date    WBC 5.89 03/27/2022    WBC 5.37 01/03/2022    WBC 4.7 10/18/2021    HGB 12.8 03/27/2022    HGB 12.8 01/03/2022    HGB 12.7 10/18/2021    MCV 98.7 (H) 03/27/2022    PLT 358 03/27/2022    PLT 339 01/03/2022    PLT 379 (H) 10/18/2021     Lab Results   Component Value Date    NA 138 03/27/2022    NA 139 01/03/2022    NA 137 10/18/2021    K 4.4 03/27/2022    K 4.6 01/07/2022    K 5.0 01/03/2022    CREAT 1.86 (H) 03/27/2022    CREAT 1.70 (H) 01/07/2022    CREAT 2.00 (H) 01/03/2022    GFRESTNOAA 29 06/09/2020    GFRESTNOAA 28 05/18/2020    GFRESTNOAA 30 03/23/2020    GFRESTAA 34 06/09/2020    GFRESTAA 33 05/18/2020    GFRESTAA 35 03/23/2020    CALCIUM 9.6 03/27/2022    CALCIUM 10.1 01/03/2022    CALCIUM 9.7 10/18/2021     Lab Results   Component Value Date    ALT 14 03/27/2022    ALT 12 01/07/2022    AST 27 03/27/2022    AST 26 01/03/2022    ALKPHOS 82 03/27/2022    BILITOT 0.3 03/27/2022    ALBUMIN 4.0 03/27/2022    ALBUMIN 4.2 01/03/2022     Lab Results   Component Value Date    TSH 2.3 01/03/2022    TSH 2.7 09/01/2021    TSH 1.5 03/16/2021     Lab Results   Component Value Date    HGBA1C 5.2 09/01/2021    HGBA1C 5.1 03/16/2021    HGBA1C 5.2 03/23/2020     Lab Results   Component Value Date    CHOL 226 01/07/2022    CHOL 237 09/01/2021    CHOL 199 06/03/2021     Lab Results   Component Value Date    CHOLDLQ 144 (H) 01/07/2022    CHOLDLQ 147 (H) 09/01/2021    CHOLDLQ 128 (H) 06/03/2021     Lab Results   Component Value Date    VITD25OH 76 10/18/2021     Lab Results   Component Value Date    FE 87 07/14/2019    FERRITIN 57 07/14/2019    FOLATE 9.6 11/25/2019    TIBC 356 07/14/2019     Lab Results   Component Value Date    VITAMINB12 3,994 (H) 11/25/2019    VITAMINB12 217 (L) 09/22/2019     Lab Results   Component Value Date    BNP 156 (H) 06/03/2021    BNP 92 09/22/2019     No results found for: ''PSATOTAL''      STUDIES:    EKG  This data element was independently reviewed and interpreted by me  NSR NO ST or T changes    XR CHEST PA LAT 2V  September 22, 2019   COMPARISON: [facility] December 17, 2018     History: sob     FINDINGS:     Lungs: Clear  Heart/aorta: Normal heart size. The thoracic aorta is mildly calcified, tortuous and ectatic.  Adenopathy: None  Pleura: No effusion  Bones and Chest wall: No acute bony or body wall  findings. Osteopenia and bony maturational changes. Right upper quadrant cholecystectomy clips stable from October 2020        IMPRESSION:     No acute findings or abnormalities related to provided history.      MRI C spine  May 03, 2021  IMPRESSION:  Redemonstrated congenital cervical spinal canal stenosis with superimposed multilevel degenerative changes, which are mildly progressive compared to prior as above. No evidence of cord compression or cord signal abnormality.      Echo  Jan 2023  1. Normal left ventricular size.  2. There are LV regional wall motion abnormalities.  3. Left ventricular ejection fraction is approximately 35 to 40%.  4. Abnormal LV diastolic function.  5. There are no prior studies on this patient for comparison purposes.  ASSESSMENT and PLAN     [name] is a 87 y.o. female who presents today for *follow up**.    #Major Depression--active--self d/ced duloxetine.  Rechallenge.  Tolerating Duloxetine.    #HFrEF--EF 35-40%noted on echo-- 2023--Not Worsening.  Chronic.  Will monitor and treat underlying risk factors with medical and lifestyle interventions as warranted by balance between benefits and burdens.   #CAD--discussed medical management alone with shared decision making model May 11, 2021.  Declines staint  #Cervical myelopathy--monitored by neuro--chronic--re-imaged on MRI as above.  CTM.  #HTN--Increased nifedipine CR 60 mg daily. Continue losartan 50 mg.  BP Better.    #CKD--stage 4--chronic--CTM--control BP as tolerated by symptoms of orthostasis and fatigue  #CAD--patient leaning towards medical management.  #Aortic Calcification--noted on chest imaging     September 22, 2019        --Not Worsening.  Chronic.  Will monitor and treat underlying risk factors with medical and lifestyle interventions as warranted by balance between benefits and burdens.  #allergic rhinitis--trial flonase  #Macrocytosis--  #Deconditioning--initiate home PT  #GERD--s/p Nisan fundoplication  #COPD--mixed--per PFT's from [name].--albuterol prn.  May resume other inhalders.  #s/p rectocele repair  #s/p cholecystectomy  #Immunosupressed status--medication related.  Chronic. CTM  #Nocturnal hypoxemia--presumably related to MG crisis but has history of COPD.  Will order nocturnal home O2 study and consider PFT's/Pulmonary evaluation once settled down.  May need to restart BREO inhaler.  #Myasthenia Gravis--s/p plasmapheresis x5 October 2020, no Thymoma, on Azathioprine 150 daily and mestinon 60 tid.  Saw Dr. Enedina Finner at [facility].  Was considering Soliris.  Ordering IVIG and MRI.  Encourage ongoing neurology follow up.   #IBS--titrate miralax to comfort.    #Interstitial cystitis--on daily Trimethoprim 100 for UTI prophylaxis.  Using bladder instillations prn. OFF topical estrogen due to advice from neurologist re: MG.  To see Dr. Lorenz Coaster.  Suspect recent flare related to breast CA re-diagnosis.  Doubt pseudomonas bactiuria is pathogen at present.  WIll complete current meropenum.    #Allergic rhinitis--previously on nasal steroids.  #Breast CA--under care.  Patient continues to decline surgery.  Wondering about additional treatment options.  Will direct to med-onc for discussion.  #DM with CKD--stabe 3b--based on labs from [name].  FOllow here.  If BP permits, may  consider ARB BUT patient with h/o angioedema so will ONLY initiate this agent with great caution.  Refer to Ophthalmology.  #OA knees--previously getting hyaluronic acid injections.  #Anxiety  #Benzodiazepine Dependence--uncomplicated--chronic.  Encourage patient to decrease intake--ideally to abstaining but will work towards this goal over time.  Not likely to achieve this given ongoing concerns re: breast ca.  #Fibromyalgia--duloxetine as above.   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  #Statin intolerance      FOLLOW-UP     RTC     Future Appointments   Date Time Provider Department Center   04/17/2022  4:40 PM York Ram., MD GERI IMS [address] [place]/Cen 04/20/2022  1:40 PM SM GONDA [id number]   05/22/2022  1:20 PM Fredda Hammed., MD HEM/ONC [facility]   06/05/2022  2:00 PM SM 2020 ECHO 03 CARDIOLOGY CARDIAC IMAGING CI [id number] [facility]   06/12/2022  2:00 PM Clelia Croft, MD CRD DIS 2020 [facility]         The above plan of care, diagnosis, orders, and follow-up were discussed with the patient and/or surrogate. Questions related to this recommended plan of care were answered.    ANALYSIS OF DATA (Needs to meet 1 category for moderate and 2 categories for high LOS)     I have:     Category 1 (Needs 3 for moderate and high LOS)     []  Reviewed []  1 []  2 []  ? 3 unique laboratory, radiology, and/or diagnostic tests noted below    Test/Study:  on date .    []  Reviewed []  1 []  2 []  ? 3 prior external notes and incorporated into patient assessment    I reviewed Dr. 's note in specialty  from date .    []  Discussed management or test interpretation with external provider(s) as noted      []  Ordered []  1 []  2 []  ? 3 unique laboratory, radiology, and/or diagnostic tests noted in A&P    []  Obtained history from independent historian:       Category 2  []  Independently interpreted the test    Category 3  []  Discussed management or test interpretation with external provider(s) as noted      INTERACTION COMPLEXITY and SOCIAL DETERMINANTS of HEALTH       PROBLEM COMPLEXITY   []  New problem with uncertain diagnosis or prognosis (moderate)   []  Multiple stable chronic problems (moderate)   []  Chronic problem not stable - not controlled, symptomatic, or worsening (moderate)   []  Severe exacerbation of chronic problem (high)   []  New or chronic problem that poses threat to life or bodily function (high)     MANAGEMENT COMPLEXITY   []  Old or external/outside records reviewed   []  Discussion with alternate (proxy) if patient with impaired communication / comprehension ability (e.g., dementia, aphasia, severe hearing loss).   []  Repeated questions (or disagreement) between patient and/among caregivers/family during the visit.  []  Caregiver/patient emotions/behavior/beliefs interfering with implementation of treatment plan.  []  Independent interpretation of test (EKG, Chest XRay)   []  Discussion of case with a consultant physician     RISK LEVEL   []  Prescription drug management (moderate)   []  Minor surgery with CV risk factors or elective major surgery (moderate)   []  Dx or Rx significantly limited by SDoH (inadequate housing, living alone, poor health care access, inappropriate diet; low literacy) (moderate)   []  Major surgery - elective with CV risk factors or emergent (high)   []   Need for hospitalization (high)   []  New DNR or de-escalation of care (high)      SDoH  The diagnosis or treatment of said conditions is significantly limited by the following social determinants of health:  []  Z59.0 Homelessness  []  Z59.1 Inadequate housing  []  Z59.2 Discord with neighbors, lodgers and landlord  []  Z60.2 Problems related to living alone  []  Z59.8 Other problems related to housing and economic circumstances  []  Z59.4 Lack of adequate food and safe drinking water  []  Z59.6 Low income  []  Z59.7 Insufficient social insurance and welfare support  []  Z59.9 Problems related to housing and economic circumstances, unspecified  []  Z75.3 Unavailability and inaccessibility of health care facilities  []  Z75.4 Unavailability and inaccessibility of other helping agencies  []  Z72.4 Inappropriate diet and eating habits  []  Z62.820 Parent-biological child conflict  []  Z63.8 Other specified problems related to primary support group  []  Z55.0 Illiteracy and low level literacy   []  Z56.9 Unspecified problems related to employment        If Billing Based on Time:     I performed the following items on the day of service:    [x]  Preparing to see the patient (e.g., review of tests)  [x]  Obtaining and/or reviewing separately obtained history   [x]  Performing a medically appropriate examination and/or evaluation   [x]  Counseling and educating the patient/family/caregiver   [x]  Ordering medications, tests, or procedures  [x]  Referring and communicating with other healthcare professionals (when not separately reported)  [x]  Documenting clinical information in the EHR  []  Independently interpreting results and communicating results to patient/family/caregiver    I spent the following total amount of time on these tasks on the day of service:  New Patient     Established Patient  []  15-29 minutes - 99202    []  up to 9 minutes - 99211  []  30-44 minutes - 99203     []  10-19 minutes - 99212   []  45-59 minutes - 99204      []  20-29 minutes - 99213   []  60-74 minutes - 99205   [x]  30- minutes - 99214         []  40-55 minutes - 99215    []  I spent an additional ** * 15-minute-increment(s) for a total of * ** minutes on these tasks on the day of service. [phone] for each additional 15 minutes.)    Author: York Ram, MD 04/17/2022    [phone] billed today:    I have a longitudinal care relationship with this patient and am the focal point of ongoing patient care related to the serious chronic and complex condition(s) assessed today as noted above in the note.   I assume responsibility of ongoing management of said condition(s) over time.

## 2022-04-17 ENCOUNTER — Ambulatory Visit: Payer: BLUE CROSS/BLUE SHIELD | Attending: Geriatric Medicine

## 2022-04-17 NOTE — Telephone Encounter
Call Back Request      Reason for call back: Lovena Le with [facility] calling back regarding previous encounters for PA, please assist, thank you     CB [phone]     Any Symptoms:  []$  Yes  [x]$  No      If yes, what symptoms are you experiencing:    Duration of symptoms (how long):    Have you taken medication for symptoms (OTC or Rx):      If call was taken outside of clinic hours:    []$ Patient or caller has been notified that this message was sent outside of normal clinic hours.     []$ Patient or caller has been warm transferred to the physician's answering service. If applicable, patient or caller informed to please call us back if symptoms progress.  Patient or caller has been notified of the turnaround time of 1-2 business day(s).

## 2022-04-18 NOTE — Telephone Encounter
Call Back Request      Reason for call back: Brittney from [place] is calling regarding previous encounters. Brittney states they have tried reaching out to office multiple times and have provided 3 different Cover my [place]. She provided newest key, but states it is only good for up to 48 hours. Brittney states patient is 3 weeks past due for her infusions and is now symptomatic, and is asking if office can go to cover my meds to complete PA.     Cover my meds key: [id number]  CBN: [phone] ext [phone]    Any Symptoms:  [x]$  Yes  []$  No      If yes, what symptoms are you experiencing:  caller did not disclose   Duration of symptoms (how long):  3 weeks   Have you taken medication for symptoms (OTC or Rx):  N/A     If call was taken outside of clinic hours:    []$ Patient or caller has been notified that this message was sent outside of normal clinic hours.     []$ Patient or caller has been warm transferred to the physician's answering service. If applicable, patient or caller informed to please call us back if symptoms progress.  Patient or caller has been notified of the turnaround time of 1-2 business day(s).

## 2022-04-21 NOTE — Telephone Encounter
Call Back Request      Reason for call back: patient calling regarding IVIG medication [employer] , per patient please submit PA as she is behind on her infusions.  Key code [id number]  KEY CODE #[id number]        Any Symptoms:  '[]'$  Yes  '[x]'$  No      If yes, what symptoms are you experiencing:    Duration of symptoms (how long):    Have you taken medication for symptoms (OTC or Rx):      If call was taken outside of clinic hours:    '[]'$ Patient or caller has been notified that this message was sent outside of normal clinic hours.     '[]'$ Patient or caller has been warm transferred to the physician's answering service. If applicable, patient or caller informed to please call us back if symptoms progress.  Patient or caller has been notified of the turnaround time of 1-2 business day(s).

## 2022-04-25 ENCOUNTER — Telehealth: Payer: BLUE CROSS/BLUE SHIELD

## 2022-04-25 NOTE — Telephone Encounter
Refill Request    Verified patient was seen within the last 6 months. If not seen within 6 months, offered appointment.    Collected prescription information from patient.    exemestane 25 mg tablet     [place], [place] - [address], [place]. [place]     Phone: [phone]     Pharmacy location was confirmed and class was set for pended orders.    Patient or caller has been notified of the turnaround time of 1-2 business day(s).

## 2022-04-26 MED ORDER — EXEMESTANE 25 MG PO TABS
25 mg | ORAL_TABLET | Freq: Every day | ORAL | 3 refills | Status: AC
Start: 2022-04-26 — End: ?

## 2022-04-26 NOTE — Telephone Encounter
Rx sent    - Lynnell Chad, PharmD 04/25/2022 4:15 PM

## 2022-04-30 ENCOUNTER — Ambulatory Visit: Payer: BLUE CROSS/BLUE SHIELD

## 2022-04-30 DIAGNOSIS — R0609 Other forms of dyspnea: Secondary | ICD-10-CM

## 2022-05-02 NOTE — Telephone Encounter
Submitted PA request through covermymeds:

## 2022-05-02 NOTE — Telephone Encounter
Call Back Request      Reason for call back: Patient asking if office can contact insurance to expedite approval as she is 6 weeks behind schedule on her infusions and would like to have asap.    Any Symptoms:  []  Yes  [x]  No      If yes, what symptoms are you experiencing:    Duration of symptoms (how long):    Have you taken medication for symptoms (OTC or Rx):      If call was taken outside of clinic hours:    [] Patient or caller has been notified that this message was sent outside of normal clinic hours.     [] Patient or caller has been warm transferred to the physician's answering service. If applicable, patient or caller informed to please call us back if symptoms progress.  Patient or caller has been notified of the turnaround time of 1-2 business day(s).

## 2022-05-03 ENCOUNTER — Ambulatory Visit: Payer: BLUE CROSS/BLUE SHIELD | Attending: Geriatric Medicine

## 2022-05-03 DIAGNOSIS — N184 Chronic kidney disease, stage 4 (severe): Secondary | ICD-10-CM

## 2022-05-03 DIAGNOSIS — H547 Unspecified visual loss: Secondary | ICD-10-CM

## 2022-05-03 DIAGNOSIS — R0602 Shortness of breath: Secondary | ICD-10-CM

## 2022-05-03 NOTE — Progress Notes
OUTPATIENT GERIATRICS CLINIC NOTE    PATIENT:  [name]   MRN:  [id number]  DOB:  [birth year]  DATE OF SERVICE:  05/03/2022  PRIMARY CARE PHYSICIAN: York Ram., MD    CHIEF COMPLAINT:   No chief complaint on file.      [place] [facility]:  [employer]    [facility] [facility]:    Chaperone status:  No data recorded    HISTORY OF PRESENT ILLNESS     [name] is a 87 y.o. female who presents today for *follow up**. Patient is accompanied by *no one**. History today is per the patient,and review of available recent records in [facility] and Care Everywhere.     Patient is a(n) [x]  reliable []  unreliable historian and additional collateral information was obtained during the visit today from:       Recent bout with COVID.  Marland Kitchen          Per chart review, pertinent medical history:  Past Medical History:   Diagnosis Date    Breast cancer (HCC/RAF) 12/20/2018     No past surgical history on file.    ALLERGIES     Allergies   Allergen Reactions    Beta Adrenergic Blockers Other (See Comments)     Avoid d/t Myasthenia Gravis  Avoid d/t Myasthenia Gravis      Levofloxacin Other (See Comments)     Flare of Myasthenia Gravis    Macrolides And Ketolides Other (See Comments)     Avoid d/t Myasthenia Gravis  Use with caution d/t Myasthenia Gravis  Use with caution d/t Myasthenia Gravis      Sulfa Antibiotics Other (See Comments)     May possibly have caused deafness in one ear  May possibly have caused deafness in one ear  other      Botulinum Toxins Other (See Comments)     Muscle weakness r/t Myasthenia Gravis  Muscle weakness r/t Myasthenia Gravis      Statins Other (See Comments)     Muscle aches  Muscle aches  Muscle aches      Plastic Tape Itching and Other (See Comments)     Turns the skin red where applied        MEDICATIONS     Personally reviewed.    No outpatient medications have been marked as taking for the 05/03/22 encounter (Appointment) with York Ram., MD.       SOCIAL HISTORY     Social History Social History Narrative    Not on file        FUNCTIONAL STATUS     BADLs:  IADLs:    Ambulates with   Falls in past year:  Afraid of falling:    GERIATRIC REVIEW OF SYSTEMS     Vision:    []   glasses     []  legally blind  Hearing: []  hearing aids    Nutrition: []  normal   []  impaired  []  vegan  []  vegetarian  []  low salt  []  low-carb   Swallowing: []  impaired   Dentures:   []  yes    Depression:  []  yes   Cognition:     Incontinence: [] urine  []  fecal  []  urine and fecal      ADVANCED CARE PLANNING     Advanced directives on file: []  No  []   Yes ? Completed:     Medical DPOA on file:   []   Yes ?   []   No ? Patient designates ** *  to be their surrogate medical decision-maker.         HEALTH CARE MAINTENANCE     IMMUNIZATIONS:   Immunization History   Administered Date(s) Administered    COVID-19 [employer] Fall 2023 12y and up) PF, 30 mcg/0.3 mL 03/27/2022    COVID-19, mRNA, (Pfizer - Purple Cap) 30 mcg/0.3 mL 03/11/2019, 04/01/2019, 10/29/2019    COVID-19, mRNA, bivalent (Pfizer) 30 mcg/0.3 mL (12y and up) 11/25/2020, 07/05/2021    COVID-19, mRNA, tris-sucrose (Pfizer - [employer]) 30 mcg/0.3 mL 06/20/2020    DTaP 12/31/2015    influenza vaccine IM quadrivalent (Fluzone Quad) MDV (69 months of age and older) 11/26/2016    influenza vaccine IM quadrivalent adjuvanted (FluAD Quad) (PF) SYR (34 years of age and older) 11/05/2018, 12/01/2019, 11/25/2020    influenza vaccine IM quadrivalent high dose (Fluzone High Dose Quad) (PF) SYR (74 years of age and older) 11/22/2021    influenza, unspecified formulation 05/10/2014, 12/29/2015, 11/26/2016, 04/01/2019, 04/01/2019, 12/01/2019, 12/01/2019, 12/01/2019, 12/01/2019, 11/25/2020, 11/25/2020    pneumococcal conjugate vaccine 20-valent (Prevnar 20) 07/05/2021    pneumococcal polysaccharide vaccine 23-valent (Pneumovax) 01/10/2019       Mammogram:  DXA:  PAP:  Colonoscopy:      PHYSICAL EXAM     BP 130/80 Comment: 2nd reading = 153/73, 90 - pulse ~ Pulse 98  ~ Temp 36.7 ?C (98 ?F)  ~ Resp 20  ~ Ht 5' 5'' (1.651 m)  ~ Wt 137 lb 9.6 oz (62.4 kg)  ~ SpO2 95%  ~ BMI 22.90 kg/m?   Wt Readings from Last 3 Encounters:   03/27/22 138 lb (62.6 kg)   03/07/22 138 lb 6.4 oz (62.8 kg)   03/06/22 137 lb 6.4 oz (62.3 kg)         System Check if normal Positive or additional negative findings   GEN  [x]  NAD     Eyes  []  Conj/Lids []  Pupils  []  Fundi   []  Sclerae []  EOM     ENT  []  External ears   []  Otoscopy   []  Gross Hearing    []   External nose   []  Nasal mucosa   []  Lips/teeth/gums    []  Oropharynx    []  Mucus membranes      Neck  []  Inspection/palpation    []  Thyroid     Resp  []  Effort    []  Auscultation       CV  []  Rhythm/rate   []  Murmurs   []  Edema   []  JVP non-elevated    Normal pulses:   []  Radial []  Femoral  []  Pedal     Breast  []  Inspection []  Palpation     GI  []  Bowel sounds    []  Nontender   []  No distension    []  No rebound or guarding   []  No masses   []  Liver/spleen    []  Rectal     GU  F:  []  External []  vaginal wall         []  Cervix     []  mucus        []  Uterus    []  Adnexa   M:  []  Scrotum []  Penis         []  Prostate     Lymph  []  Cervical []  supraclavicular     []  Axillae   []  Groin/inguinal     MSK []  Gait  []  Back     Specify site examined:    []   Inspect/palp []  ROM   []  Stability []  Strength/tone []  Used arms to push up from seated to standing position   Assistive device:  []  single point cane []  quad cane  []  FWW []  rollator walker      Skin  []  Inspection []  Palpation     Neuro  []  Alert and oriented     []  CN2-12 intact grossly   []  DTR      []  Muscle strength      []  Sensation   []  Pronator drift   []  Finger to Nose/Heel to Shin   []  Romberg     Psych  []  Insight/judgement     []  Mood/affect    []  Gross cognition            LABS/STUDIES     LABS:  Lab Results   Component Value Date    WBC 5.89 03/27/2022    WBC 5.37 01/03/2022    WBC 4.7 10/18/2021    HGB 12.8 03/27/2022    HGB 12.8 01/03/2022    HGB 12.7 10/18/2021    MCV 98.7 (H) 03/27/2022    PLT 358 03/27/2022 PLT 339 01/03/2022    PLT 379 (H) 10/18/2021     Lab Results   Component Value Date    NA 138 03/27/2022    NA 139 01/03/2022    NA 137 10/18/2021    K 4.4 03/27/2022    K 4.6 01/07/2022    K 5.0 01/03/2022    CREAT 1.86 (H) 03/27/2022    CREAT 1.70 (H) 01/07/2022    CREAT 2.00 (H) 01/03/2022    GFRESTNOAA 29 06/09/2020    GFRESTNOAA 28 05/18/2020    GFRESTNOAA 30 03/23/2020    GFRESTAA 34 06/09/2020    GFRESTAA 33 05/18/2020    GFRESTAA 35 03/23/2020    CALCIUM 9.6 03/27/2022    CALCIUM 10.1 01/03/2022    CALCIUM 9.7 10/18/2021     Lab Results   Component Value Date    ALT 14 03/27/2022    ALT 12 01/07/2022    AST 27 03/27/2022    AST 26 01/03/2022    ALKPHOS 82 03/27/2022    BILITOT 0.3 03/27/2022    ALBUMIN 4.0 03/27/2022    ALBUMIN 4.2 01/03/2022     Lab Results   Component Value Date    TSH 2.3 01/03/2022    TSH 2.7 09/01/2021    TSH 1.5 03/16/2021     Lab Results   Component Value Date    HGBA1C 5.2 09/01/2021    HGBA1C 5.1 03/16/2021    HGBA1C 5.2 03/23/2020     Lab Results   Component Value Date    CHOL 226 01/07/2022    CHOL 237 09/01/2021    CHOL 199 06/03/2021     Lab Results   Component Value Date    CHOLDLQ 144 (H) 01/07/2022    CHOLDLQ 147 (H) 09/01/2021    CHOLDLQ 128 (H) 06/03/2021     Lab Results   Component Value Date    VITD25OH 76 10/18/2021     Lab Results   Component Value Date    FE 87 07/14/2019    FERRITIN 57 07/14/2019    FOLATE 9.6 11/25/2019    TIBC 356 07/14/2019     Lab Results   Component Value Date    VITAMINB12 3,994 (H) 11/25/2019    VITAMINB12 217 (L) 09/22/2019     Lab Results   Component Value Date    BNP 156 (  H) 06/03/2021    BNP 92 09/22/2019     No results found for: ''PSATOTAL''      STUDIES:      XR CHEST PA LAT 2V  September 22, 2019   COMPARISON: [facility] December 17, 2018     History: sob     FINDINGS:     Lungs: Clear  Heart/aorta: Normal heart size. The thoracic aorta is mildly calcified, tortuous and ectatic.  Adenopathy: None  Pleura: No effusion  Bones and Chest wall: No acute bony or body wall  findings. Osteopenia and bony maturational changes. Right upper quadrant cholecystectomy clips stable from October 2020        IMPRESSION:     No acute findings or abnormalities related to provided history.      MRI C spine  May 03, 2021  IMPRESSION:  Redemonstrated congenital cervical spinal canal stenosis with superimposed multilevel degenerative changes, which are mildly progressive compared to prior as above. No evidence of cord compression or cord signal abnormality.      Echo  Jan 2023  1. Normal left ventricular size.  2. There are LV regional wall motion abnormalities.  3. Left ventricular ejection fraction is approximately 35 to 40%.  4. Abnormal LV diastolic function.  5. There are no prior studies on this patient for comparison purposes.  ASSESSMENT and PLAN     [name] is a 87 y.o. female who presents today for *follow up**.    #Major Depression--active--self d/ced duloxetine.  Rechallenge.  Tolerating Duloxetine.    #HFrEF--EF 35-40%noted on echo-- 2023--Not Worsening.  Chronic.  Will monitor and treat underlying risk factors with medical and lifestyle interventions as warranted by balance between benefits and burdens.   #CAD--discussed medical management alone with shared decision making model May 11, 2021.  Declines staint  #Cervical myelopathy--monitored by neuro--chronic--re-imaged on MRI as above.  CTM.  #HTN--Increased nifedipine CR 60 mg daily. Continue losartan 50 mg.  BP Better.    #CKD--stage 4--chronic--CTM--control BP as tolerated by symptoms of orthostasis and fatigue.  Trial off trimethoprim.  REcheck Cr now and in two weeks.  #CAD--patient leaning towards medical management.  #Aortic Calcification--noted on chest imaging     September 22, 2019        --Not Worsening.  Chronic.  Will monitor and treat underlying risk factors with medical and lifestyle interventions as warranted by balance between benefits and burdens.  #allergic rhinitis--trial flonase  #Macrocytosis--  #Deconditioning--initiate home PT  #GERD--s/p Nisan fundoplication  #COPD--mixed--per PFT's from [name].--albuterol prn.  May resume other inhalders.  #s/p rectocele repair  #s/p cholecystectomy  #Immunosupressed status--medication related.  Chronic. CTM  #Nocturnal hypoxemia--presumably related to MG crisis but has history of COPD.  Will order nocturnal home O2 study and consider PFT's/Pulmonary evaluation once settled down.  May need to restart BREO inhaler.  #Myasthenia Gravis--s/p plasmapheresis x5 October 2020, no Thymoma, on Azathioprine 150 daily and mestinon 60 tid.  Saw Dr. Enedina Finner at [facility].  Was considering Soliris.  Ordering IVIG and MRI.  Encourage ongoing neurology follow up.   #IBS--titrate miralax to comfort.    #Interstitial cystitis--stopping daily Trimethoprim 100 for UTI prophylaxis.  Using bladder instillations prn. OFF topical estrogen due to advice from neurologist re: MG.  To see Dr. Lorenz Coaster.  Suspect recent flare related to breast CA re-diagnosis.  Doubt pseudomonas bactiuria is pathogen at present.  WIll complete current meropenum.  #Allergic rhinitis--previously on nasal steroids.  #Breast CA--under care.  Patient continues to decline surgery.  Wondering  about additional treatment options.  Will direct to med-onc for discussion.  #DM with CKD--stabe 3b--based on labs from [name].  FOllow here.  If BP permits, may consider ARB BUT patient with h/o angioedema so will ONLY initiate this agent with great caution.  Refer to Ophthalmology.  #OA knees--previously getting hyaluronic acid injections.  #Anxiety  #Benzodiazepine Dependence--uncomplicated--chronic.  Encourage patient to decrease intake--ideally to abstaining but will work towards this goal over time.  Not likely to achieve this given ongoing concerns re: breast ca.  #Fibromyalgia--duloxetine as above.   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  #Statin intolerance      FOLLOW-UP     RTC Future Appointments   Date Time Provider Department Center   05/03/2022  4:20 PM York Ram., MD GERI IMS [address] [place]/Cen   05/22/2022  1:20 PM Fredda Hammed., MD HEM/ONC [address] [facility]   05/29/2022  3:20 PM York Ram., MD GERI IMS [address] [place]/Cen   06/05/2022  2:00 PM SM 2020 ECHO 03 CARDIOLOGY CARDIAC IMAGING CI [id number] [facility]   06/12/2022  2:00 PM Clelia Croft, MD CRD DIS 2020 [facility]         The above plan of care, diagnosis, orders, and follow-up were discussed with the patient and/or surrogate. Questions related to this recommended plan of care were answered.    ANALYSIS OF DATA (Needs to meet 1 category for moderate and 2 categories for high LOS)     I have:     Category 1 (Needs 3 for moderate and high LOS)     []  Reviewed []  1 []  2 []  ? 3 unique laboratory, radiology, and/or diagnostic tests noted below    Test/Study:  on date .    []  Reviewed []  1 []  2 []  ? 3 prior external notes and incorporated into patient assessment    I reviewed Dr. 's note in specialty  from date .    []  Discussed management or test interpretation with external provider(s) as noted      []  Ordered []  1 []  2 []  ? 3 unique laboratory, radiology, and/or diagnostic tests noted in A&P    []  Obtained history from independent historian:       Category 2  []  Independently interpreted the test    Category 3  []  Discussed management or test interpretation with external provider(s) as noted      INTERACTION COMPLEXITY and SOCIAL DETERMINANTS of HEALTH       PROBLEM COMPLEXITY   []  New problem with uncertain diagnosis or prognosis (moderate)   []  Multiple stable chronic problems (moderate)   []  Chronic problem not stable - not controlled, symptomatic, or worsening (moderate)   []  Severe exacerbation of chronic problem (high)   []  New or chronic problem that poses threat to life or bodily function (high)     MANAGEMENT COMPLEXITY   []  Old or external/outside records reviewed   []  Discussion with alternate (proxy) if patient with impaired communication / comprehension ability (e.g., dementia, aphasia, severe hearing loss).   []  Repeated questions (or disagreement) between patient and/among caregivers/family during the visit.  []  Caregiver/patient emotions/behavior/beliefs interfering with implementation of treatment plan.  []  Independent interpretation of test (EKG, Chest XRay)   []  Discussion of case with a consultant physician     RISK LEVEL   []  Prescription drug management (moderate)   []  Minor surgery with CV risk factors or elective major surgery (moderate)   []  Dx or Rx significantly limited  by SDoH (inadequate housing, living alone, poor health care access, inappropriate diet; low literacy) (moderate)   []  Major surgery - elective with CV risk factors or emergent (high)   []  Need for hospitalization (high)   []  New DNR or de-escalation of care (high)      SDoH  The diagnosis or treatment of said conditions is significantly limited by the following social determinants of health:  []  Z59.0 Homelessness  []  Z59.1 Inadequate housing  []  Z59.2 Discord with neighbors, lodgers and landlord  []  Z60.2 Problems related to living alone  []  Z59.8 Other problems related to housing and economic circumstances  []  Z59.4 Lack of adequate food and safe drinking water  []  Z59.6 Low income  []  Z59.7 Insufficient social insurance and welfare support  []  Z59.9 Problems related to housing and economic circumstances, unspecified  []  Z75.3 Unavailability and inaccessibility of health care facilities  []  Z75.4 Unavailability and inaccessibility of other helping agencies  []  Z72.4 Inappropriate diet and eating habits  []  Z62.820 Parent-biological child conflict  []  Z63.8 Other specified problems related to primary support group  []  Z55.0 Illiteracy and low level literacy   []  Z56.9 Unspecified problems related to employment        If Billing Based on Time:     I performed the following items on the day of service:    [x]  Preparing to see the patient (e.g., review of tests)  [x]  Obtaining and/or reviewing separately obtained history   [x]  Performing a medically appropriate examination and/or evaluation   [x]  Counseling and educating the patient/family/caregiver   [x]  Ordering medications, tests, or procedures  [x]  Referring and communicating with other healthcare professionals (when not separately reported)  [x]  Documenting clinical information in the EHR  []  Independently interpreting results and communicating results to patient/family/caregiver    I spent the following total amount of time on these tasks on the day of service:  New Patient     Established Patient  []  15-29 minutes - 99202    []  up to 9 minutes - 99211  []  30-44 minutes - 99203     []  10-19 minutes - 99212   []  45-59 minutes - 99204      []  20-29 minutes - 99213   []  60-74 minutes - 99205   [x]  30- minutes - 99214         []  40-55 minutes - 99215    []  I spent an additional ** * 15-minute-increment(s) for a total of * ** minutes on these tasks on the day of service. [phone] for each additional 15 minutes.)    Author: York Ram, MD 05/03/2022    [phone] billed today:    I have a longitudinal care relationship with this patient and am the focal point of ongoing patient care related to the serious chronic and complex condition(s) assessed today as noted above in the note.   I assume responsibility of ongoing management of said condition(s) over time.      PFT's  Ophthalmology--retina  Insurance Question

## 2022-05-04 NOTE — Patient Instructions
Stop Trimethoprim

## 2022-05-09 NOTE — Telephone Encounter
Call Back Request      Reason for call back: Patient is calling to ask if provider is still wanted her to get infusions done. Patient states she usually gets them done every 2-3 weeks, but has not had one done in about 7 weeks now because insurance has not received information they have been requesting.     CBN: [phone]    Any Symptoms:  '[]'$  Yes  '[x]'$  No      If yes, what symptoms are you experiencing:    Duration of symptoms (how long):    Have you taken medication for symptoms (OTC or Rx):      If call was taken outside of clinic hours:    '[]'$ Patient or caller has been notified that this message was sent outside of normal clinic hours.     '[]'$ Patient or caller has been warm transferred to the physician's answering service. If applicable, patient or caller informed to please call us back if symptoms progress.  Patient or caller has been notified of the turnaround time of 1-2 business day(s).

## 2022-05-15 ENCOUNTER — Ambulatory Visit: Payer: BLUE CROSS/BLUE SHIELD

## 2022-05-15 NOTE — Telephone Encounter
From: Laurena Slimmer C.   Sent: Tuesday, May 15, 2022 10:39 AM  To: Cindie Crumbly. [email or web address]>; Arvin Collard, Phoebedel [email or web address]>; Merri [email or web address]>  Cc: Clare Charon [email or web address]  Subject: RE: POI Infusion Question - PHI    Hi again!     I just got a call back and was informed that Insurance was holding auth/medication due to patient having a new insurance and [employer] stated that patient had not paid her premium in the last 3 months. She also informed me that now that patient has paid it [place] dept will now need to release auth for meds for patient to continue her treatment.     Thank you,   Riki Sheer.

## 2022-05-15 NOTE — Telephone Encounter
From: Laurena Slimmer C.   Sent: Tuesday, May 15, 2022 10:00 AM  To: Cindie Crumbly. [email or web address]>; [place], Phoebedel [email or web address]>; Taylor, Joshua M. [email or web address]  Cc: Eben Burow [email or web address]  Subject: RE: POI Infusion Question - PHI    Hi Clare Charon,     I called [facility] to follow up if they have received a response from the PA request submitted on 05/02/2022. Janella informed me that she will speak with pharmacy if they have gotten clearance and call me back.     I will update you with any additional information I receive.     Thank you,   16/07/2022.

## 2022-05-18 ENCOUNTER — Ambulatory Visit: Payer: BLUE CROSS/BLUE SHIELD | Attending: Retina Specialist

## 2022-05-18 DIAGNOSIS — H35373 Puckering of macula, bilateral: Secondary | ICD-10-CM

## 2022-05-18 DIAGNOSIS — H538 Other visual disturbances: Secondary | ICD-10-CM

## 2022-05-18 DIAGNOSIS — H31003 Unspecified chorioretinal scars, bilateral: Secondary | ICD-10-CM

## 2022-05-18 DIAGNOSIS — H43813 Vitreous degeneration, bilateral: Secondary | ICD-10-CM

## 2022-05-18 NOTE — Progress Notes
Assessment and Plan      Problem List        Eye/Vision Problems    1. Chorioretinal scars, bilateral     Overview      Stable  Monitor         2. Epiretinal membrane, bilateral - Primary     Overview      Not visually significant  Monitor         3. PVD (posterior vitreous detachment), bilateral     Overview      No evidence of lesions predisposing to retinal detachment. Retinal   detachment precautions reviewed with patient x3.    Discussed warning signs of retinal tear and retinal detachment: If patient   experiences worsening flashes, worsening floaters, a fixed-visual field   deficit, or any deterioration of vision contact the office immediately   even after regular hours.          4. Pseudophakia of both eyes     Overview      Patient endorses history of movement during CEIOL with atypical lens   placment right eye. Lens appears stable and centered right eye, slightly   superior displaced but stable right eye. Monitor          Current Assessment & Plan                   Other    5. Myasthenia gravis with exacerbation (HCC/RAF)     Overview      05/18/2022  Stable with treatment, no double vision. Referral to optom for glasses.   All risks, benefits, and alternatives discussed with patient. Follow up   when needed                Orders:  Orders Placed This Encounter    [facility] COLOR PHOTO OU (BOTH EYES)     Technician Instructions:     Scheduling Instructions:      Patient Instructions:    OCT MACULA HEIDELBERG OU (BOTH EYES)     Technician Instructions:     Scheduling Instructions:      Patient Instructions:    FUNDUS AUTOFLUORESCENCE PHOTO (FAF) OU (BOTH EYES)     Scheduling Instructions:      Patient Instructions:       Follow ups:  Return if symptoms worsen or fail to improve, for eval and OCT.         I discussed the above assessment and plan with the patient. She had the opportunity to ask questions, and her questions and concerns were addressed. She was reminded to call if there is any significant change or worsening in vision, or to get an evaluation, urgently if appropriate.    Author:   Cynda Acres, MD  I, Cynda Acres, acted as medical scribe for Dr. Cynda Acres, MD on 05/18/2022 at 9:58 PM.   Dr. Cynda Acres, MD has reviewed all the notes recorded by the medical scribe, and attests that it is an accurate representation of the patient's examination, assessment, and plan.   This note details the assessment and plan of the encounter for this date. For a complete note and record of the encounter, please see the Encounter Summary.

## 2022-05-20 DIAGNOSIS — C50811 Malignant neoplasm of overlapping sites of right female breast: Secondary | ICD-10-CM

## 2022-05-20 DIAGNOSIS — Z170 Estrogen receptor positive status [ER+]: Secondary | ICD-10-CM

## 2022-05-20 DIAGNOSIS — C50812 Malignant neoplasm of overlapping sites of left female breast: Secondary | ICD-10-CM

## 2022-05-21 ENCOUNTER — Telehealth: Payer: BLUE CROSS/BLUE SHIELD

## 2022-05-21 NOTE — Progress Notes
HEMATOLOGY/ONCOLOGY OUTPATIENT NOTE    PATIENT: [name]  MRN: [id number]  DOB: 1935-10-22  DATE OF SERVICE: 05/22/2022  Referring MD: York Ram., MD    Reason for Consultation/ Chief Complaint:  Bilateral breast cancer    HPI   [name] is a 87 y.o. female with PMH including myasthenia gravis, asymmetric hearing loss, fibromyalgia, DM with CKD, major depression, interstitial cystitis, IBS, COPD who presents to establish care for breast cancer.     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 07/19/2017 Screening detected right breast calcifications and distortion in the upper outer quadrant: Biopsy DCIS+ ALH +CSL; 4 cm apart, axilla negative.    Neoadjuvant tamoxifen started 10/30/17, stopped after 4-6 weeks due to dry mouth and mouth sores. Switched to anastrozole 01/29/18, discontinued because it made her throat dry. Declined additional antiestrogen therapy.   Moved on October of 2020 to [place]      [name] then noted left breast pain in April 2022.     Underwent a diagnostic bilateral breast imaging 06/13/2020 which revealed:    - Right breast: solid mass with indistinct margins measuring 14 x 4 x 10 mm in the right breast at 1 o'clock located 6 centimeters from the nipple with associated microclip,  not parallel solid mass with irregular margins measuring 8 x 4 x 5 mm seen in the right breast at 12 o'clock, upper outer quadrant located 6 centimeters from the nipple.    - Left breast: irregular mass with angular margins measuring 10 x 9 x 9 mm seen in the left breast at 11 o'clock located 5 centimeters from the nipple, axillary lymph node in the left axilla with cortical thickness measuring up to 6 mm.    R breast biopsy at 12:00, 6 cm from nipple on 07/13/2020 revealed invasive ductal carcinoma with lobular and focal cribriform features, grade 2 ER (+) PR (+) HER2 (-), Ki-67 5%    L breast biopsy at 11:00, 5 cm from nipple on 07/13/2020 revealed invasive ductal carcinoma with lobular features, grade 2 ER (+) PR (-) HER2 (-), Ki-67 10%     Breast MRI performed on 08/05/2020 that redemonstrated multifocal right breast malignancy and left breast IDC with an additional 4.3cm of NME that spanned nearly the entire left breast that appears suspicious.     [facility] genetic testing (-) for pathogenic variants.     08/17/2020 Patient presents to establish care for breast cancer. She has seen Dr. Janee Morn to discuss surgical treatment, and is quite worried about anesthesia with her myasthenia gravis . She is interested in neoadjuvant therapy     09/13/2020 Patient presents for follow-up of breast cancer. PETCT on 08/22/2020 with no evidence of FDG avid metastatic disease. DXA scan on 08/26/2020 demonstrated osteopenia of the left femoral neck and total hip.     10/28/2020 Patient presents for follow-up of breast cancer. Labs on 09/13/2020 showed WBC 5.3, hgb 11.4, platelets 300, ANC 3.58. Endorses fatigue, frequent diarrhea. BM anytime she eats, usually about 3 times a day. Diarrhea is more frequent in the morning. Also reports hot flashes. Breasts are tender, using a larger bra size.     11/25/2020 Patient presents for follow-up of breast cancer. Labs on 9/2/202 showed WBC 6.7, hgb 12.0, platelets 384,  ANC 4.80. Breast MRI on 11/18/2020 with stable to slightly decreased size of known biopsy proven malignancies in the right breast, decreased size of the irregular mass with microclip  at 11:00, 5 cm from the nipple, consistent with known malignancy, nonmass enhancement spanning 40 mm has also slightly decreased in size and now demonstrates subthreshold kinetics, this remains suspicious, previously described oval enhancing mass at 4:00 is no longer identified.     01/03/2021 Patient presents for follow-up of breast cancer. Labs on 12/20/2020 showed WBC 6.4, hgb 9.7, platelets 278, ANC 5.00.     Hospital admission 10/23-10/25/2022. P/w atypical chest pain x 2-3 days with new LBBB but negative troponin and relief w/ antacid medications most c/f upper GI pathology such as severe GERD, PUD, or gastritis. CT AP without acute abnormality. Initiated pantoprazole 40 mg PO bid with improvement in symptoms with PPI and GI cocktail w/maalox. Patient able to tolerate all meals without recurrent chest pain. GI was consulted and given improvement in symptoms, EGD deferred. Continue Maalox q4h PRN. H pylori stool Ag discontinued given long term PPI use. Continue dexelant outpatient. Ondansetron 4 mg PO q6hr PRN Rx for nausea. Outpatient GI follow-up.     10/18/2021 Patient presents for follow-up of breast cancer. Labs on 09/01/2021 showed WBC 4.94, hgb 11.8, platelets 342. Breast MRI 05/03/2021 with Interval decrease in size of known malignancies in the right breast with trace residual enhancement, Grossly stable irregular mass at 11 o'clock corresponding to biopsy-proven malignancy,  Interval resolution of previously described regional nonmass enhancement in the upper inner, upper outer, and lower outer quadrants. She admits to persistent diarrhea associated with significant weight loss. She often does not eat during the day because she is fearful of leaving the house d/t diarrhea. She does not take much imodium. The diarrhea is dependent on what she eats. Has longstanding hx of IBS. Diarrhea is improved when she takes dexilant but was unable to obtain the dexilant in the preferred form for a while. She denies B symptoms. Admits to occasional vomiting associated with the diarrhea. Continues with IVIG for myasthenia gravis. Has decided she would not like to pursue breast surgery due to age, co morbidities, anaesthetic risks.     11/14/2021 Patient presents for follow-up of breast cancer. Labs on 10/18/2021 showed WBC 4.7, hgb 12.7, platelets 379, ANC 3.81, CEA 5.00. CT chest 11/13/2021 (-). Met with Dr. Lindajo Royal today. Held Arimidex from last visit until yesterday. Diarrhea resolved while she held the Anastrazole. She actually became constipated.     01/11/2022 Patient presents for follow up of breast cancer. MR abd/pelv 11/23/2021 (-). Labs on 01/03/2022 showed WBC 5.37, hgb 12.8, PLT 339. Switched to exemestane last visit    05/22/2022 Patient presents for follow up of bilateral breast cancer. Labs on 03/27/2022 showed WBC 5.89, hgb 12.8, platelets 358. Reports diarrhea has resolved since switching to exemestane       ECOG performance status: 0  PAST HISTORY   PMH:  Patient Active Problem List    Diagnosis Date Noted    Epiretinal membrane, bilateral 05/18/2022     Not visually significant  Monitor      PVD (posterior vitreous detachment), bilateral 05/18/2022     No evidence of lesions predisposing to retinal detachment. Retinal detachment precautions reviewed with patient x3.    Discussed warning signs of retinal tear and retinal detachment: If patient experiences worsening flashes, worsening floaters, a fixed-visual field deficit, or any deterioration of vision contact the office immediately even after regular hours.       Chorioretinal scars, bilateral 05/18/2022     Stable  Monitor      Heart failure with reduced ejection fraction LVEF <=  40% (HCC/RAF) 11/25/2021    Atypical chest pain 12/20/2020    Acute chest pain 12/18/2020    Immunosuppressed status (HCC/RAF) 03/21/2020    HTN (hypertension) 10/31/2019    Aortic calcification (HCC/RAF) 09/25/2019    Refractive error 04/28/2019    Ptosis of eyelid 04/28/2019    Pseudophakia of both eyes 04/28/2019     Patient endorses history of movement during CEIOL with atypical lens placment right eye. Lens appears stable and centered right eye, slightly superior displaced but stable right eye. Monitor      Benzodiazepine dependence (HCC/RAF) 12/20/2018    Breast cancer (HCC/RAF) 12/20/2018    Spondylosis without myelopathy or radiculopathy, lumbar region 11/28/2018    Anxiety 11/27/2018    CKD (chronic kidney disease), stage III (HCC/RAF) 11/27/2018    Nocturnal hypoxemia 04/07/2018 Last Assessment & Plan:   We discussed potential impact of hypoxemia from her COPD on other body function  Plan-overnight oximetry      Major depression, recurrent, chronic (HCC/RAF) 12/16/2017    Diabetes mellitus (HCC/RAF) 12/16/2017    Osteoarthritis of knee 05/17/2017    H/O arthroscopy 03/06/2017    Myasthenia gravis with exacerbation (HCC/RAF) 12/18/2016     05/18/2022  Stable with treatment, no double vision. Referral to optom for glasses. All risks, benefits, and alternatives discussed with patient. Follow up when needed      Vaginal atrophy 08/16/2016    Acute medial meniscal tear, left, subsequent encounter 07/18/2016    Renal cyst 03/12/2016    Asthmatic bronchitis, mild persistent, with acute exacerbation 12/29/2015     Last Assessment & Plan:   Continue rescue inhaler as needed. Try adding sample Bevespi maintenance inhaler to see if this helps.  She is currently on prednisone 20 mg daily maintenance and I don't think adding a steroid inhaler component will make any difference.      Urethral caruncle 07/26/2015    Chronic cholecystitis 01/14/2015    Angioedema 05/23/2014     Attributed to injected methylprednisolone    Last Assessment & Plan:   We discussed limited ability to test for possible triggers. We are asking our labs sources about ability to test against methylprednisolone for injection.  A different brand might be worth trying. She can try pre-medicating with an antihistamine.      Chronic interstitial cystitis 10/01/2011    Insomnia 05/31/2007     Qualifier: Diagnosis of   By: Maple Hudson MD, Clinton D    Last Assessment & Plan:   I'm concerned that some of her sleep problems may reflect obstructive sleep apnea based on her palate and which she can tell me about her sleep patterns without a witness. We also want to know about oxygenation during sleep.  Plan-schedule sleep study  Qualifier: Diagnosis of   By: Maple Hudson MD, Clinton D      Last Assessment & Plan:   I'm concerned that some of her sleep problems may reflect obstructive sleep apnea based on her palate and which she can tell me about her sleep patterns without a witness. We also want to know about oxygenation during sleep.  Plan-schedule sleep study      COPD mixed type (HCC/RAF) 04/30/2007     Office Spirometry 03/16/2016-severe obstructive airways disease FVC 1.59/59%, FEV1 0.98/49%, ratio 0.62, FEF 25-75% 0.42/28%  PFT 04/16/16-severe obstructive airways disease with slight response to bronchodilator, severe diffusion defect. FEV1/FVC 0.64, DLCO 45%    Last Assessment & Plan:   Severe obstructive airways disease. She can't hear herself wheeze.  Plan-try samples of Trelegy instead of Anoro for comparison    Overview:   Office Spirometry 03/16/2016-severe obstructive airways disease FVC 1.59/59%, FEV1 0.98/49%, ratio 0.62, FEF 25-75% 0.42/28%  PFT 04/16/16-severe obstructive airways disease with slight response to bronchodilator, severe diffusion defect. FEV1/FVC 0.64, DLCO 45%    Last Assessment & Plan:   Severe obstructive airways disease. She can't hear herself wheeze.  Plan-try samples of Trelegy instead of Anoro for comparison  Office Spirometry 03/16/2016-severe obstructive airways disease FVC 1.59/59%, FEV1 0.98/49%, ratio 0.62, FEF 25-75% 0.42/28%  PFT 04/16/16-severe obstructive airways disease with slight response to bronchodilator, severe diffusion defect. FEV1/FVC 0.64, DLCO 45%    Last Assessment & Plan:   Severe COPD but mainly emphysema with limited bronchospasm component that could be addressed with bronchodilators.  Plan-try sample Bevespi as a maintenance controller, refill pro-air      Esophageal reflux 04/30/2007     History of fundoplication     Last Assessment & Plan:   Emphasized continued attention to antireflux measures. Reminded that reflux can be a significant trigger for irritable airways, cough and wheeze.  History of fundoplication       Last Assessment & Plan:   Emphasized continued attention to antireflux measures. Reminded that reflux can be a significant trigger for irritable airways, cough and wheeze.      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 Management is mostly by her ENT physicians.      Fibromyalgia 04/30/2007     Qualifier: Diagnosis of   By: Yetta Barre CNA/MA, Rose Fillers: Diagnosis of   By: Yetta Barre CNA/MA, Jessica      Seasonal and perennial allergic rhinitis 04/30/2007     Qualifier: Diagnosis of   By: Yetta Barre CNA/MA, Jessica     Last Assessment & Plan:   She is now on prednisone 20 mg daily. I suggested she wait until her myasthenia status is stabilized. Then if she needs to she couldn't seek evaluation at one of the allergy practices in town as discussed, since our vaccine program will be closing.  Qualifier: Diagnosis of   By: Yetta Barre CNA/MA, Jessica       Last Assessment & Plan:   She is now on prednisone 20 mg daily. I suggested she wait until her myasthenia status is stabilized. Then if she needs to she couldn't seek evaluation at one of the allergy practices in town as discussed, since our vaccine program will be closing.     Tobacco: smoked  For 30 years     Surgical History:  No past surgical history on file.    Gyn Hx:  Menses 15   G2P2- first birth 30  Menopause :50's   Menopausal status: Postmenopausal  Hormone therapy use: None      Family and Social History  No family history on file.  Social History     Tobacco Use    Smoking status: Former     Types: Cigarettes     Quit date: 1985     Years since quitting: 39.2    Smokeless tobacco: Former   Substance Use Topics    Alcohol use: Not Currently       MEDS     No outpatient medications have been marked as taking for the 05/22/22 encounter (Office Visit) with Fredda Hammed.,  MD.     Allergies   Allergen Reactions    Beta Adrenergic Blockers Other (See Comments)     Avoid d/t Myasthenia Gravis  Avoid d/t Myasthenia Gravis      Levofloxacin Other (See Comments)     Flare of Myasthenia Gravis    Macrolides And Ketolides Other (See Comments)     Avoid d/t Myasthenia Gravis  Use with caution d/t Myasthenia Gravis  Use with caution d/t Myasthenia Gravis      Sulfa Antibiotics Other (See Comments)     May possibly have caused deafness in one ear  May possibly have caused deafness in one ear  other      Botulinum Toxins Other (See Comments)     Muscle weakness r/t Myasthenia Gravis  Muscle weakness r/t Myasthenia Gravis      Statins Other (See Comments)     Muscle aches  Muscle aches  Muscle aches      Plastic Tape Itching and Other (See Comments)     Turns the skin red where applied       PHYSICAL EXAM     Last Recorded Vital Signs:    05/22/22 1325   BP: 142/76   Pulse: (!) 114   Resp: 18   Temp: 36.3 ?C (97.3 ?F)   SpO2: 93%      Body mass index is 23.3 kg/m?Marland Kitchen  Wt Readings from Last 3 Encounters:   05/22/22 63.5 kg (140 lb)   05/03/22 62.4 kg (137 lb 9.6 oz)   03/27/22 62.6 kg (138 lb)       Review of Systems:  Pertinent items are noted in HPI.      Physical Exam:  General: No acute distress. Appears well-developed, well-nourished and close to stated age.   Head: Normocephalic, atraumatic.  Eyes: Sclera anicteric. EOMI.  ENT: Hearing grossly normal bilaterally. Oropharynx is clear, mucus membranes are moist.  No oral ulcers noted. Good dentition.  Neck: Supple. Trachea midline.   Cardiac: Regular rate and rhythm. Normal S1, S2. No murmurs, rubs, or gallops.  Respiratory: Clear to auscultation bilaterally. No wheezes, rales, or rhonchi noted. Respiratory effort appears normal.   Abdomen: Soft, nontender and nondistended. Bowel sounds are present and normoactive. No organomegaly is appreciated.  Musculoskeletal: No edema. No cyanosis. Extremities are warm and well-perfused.   Extremities: Warm. No edema. No cyanosis.  Neurologic: Gait appears normal. Sensation intact to light touch in all four extremities. Oriented to person, place and time.   Hematologic: No bruising, purpura or petechiae are noted.   Dermatologic: Skin intact.  No rashes appreciated.   Lymphatic: No palpable cervical, supraclavicular, axillary or inguinal adenopathy appreciated.   Psychiatric: Affect appropriate.  Pleasant and conversant.   Breast exam: no breast masses or axillary adenopathy noted     Recent Labs:  Lab Results   Component Value Date    WBC 5.89 03/27/2022    HGB 12.8 03/27/2022    HCT 38.5 03/27/2022    MCV 98.7 (H) 03/27/2022    PLT 358 03/27/2022      Lab Results   Component Value Date    NA 138 05/05/2022    K 4.6 05/05/2022    CL 105 05/05/2022    CO2 22 05/05/2022    CREAT 1.67 (H) 05/05/2022    BUN 24 (H) 05/05/2022     Lab Results   Component Value Date    ALT 14 05/03/2022    AST 21 05/03/2022    ALKPHOS 81 05/03/2022  BILITOT <0.2 05/03/2022     Lab Results   Component Value Date    CALCIUM 9.5 05/05/2022       Pertinent imaging:  01/26/2022 Mammo/US diagnostic, bilat  IMPRESSION:  1. Biopsy proven malignancies of right mass at 1:00 and 12:00 both at 6 cm FN (X and ribbon clip)  have decreased in size sonographically. Right breast area with coil microclip stable in appearance  on mammogram. Appropriate action should be taken based on pathology results.  2. Increasing calcifications right breast compared to prior mammogram. Unclear if this is  progression of disease as prior MRI demonstrates trace residual enhancement. If breast conservation  surgery is of interest, stereotactic biopsy may be performed.  3. RIght breast probably benign mass at 12:00 6 cm FN stable, recommend follow up US in 6 months.  4. L breast 11:00 5 cm FN biopsy proven malignancy has slightly increased in size sonographically  compared to prior ultrasound.  5. Probably benign masses of left breast at 12:00 3 cm FN and 1:00 1 cm FN are stable, recommend  follow up US in 6 months.  6. Stable thickened left axillary lymph node, continued surgical/oncologic consultation is  recommended.    11/13/2021 CT chest wo  History of bilateral breast cancer without definite metastatic disease in the thorax     05/03/2021 MR breast  1. Right breast:   *  Interval decrease in size of known malignancies in the right breast with trace residual enhancement. Recommend continued surgical and oncological management. BI-RADS CATEGORY 6 - KNOWN BIOPSY-PROVEN MALIGNANCY.      2. Left breast:  *  Grossly stable irregular mass at 11 o'clock corresponding to biopsy-proven malignancy. Recommend continued surgical and oncological management. BI-RADS CATEGORY 6 - KNOWN BIOPSY-PROVEN MALIGNANCY.   *  Interval resolution of previously described regional nonmass enhancement in the upper inner, upper outer, and lower outer quadrants. Recommend continued surgical management. BI-RADS CATEGORY 4 - SUSPICIOUS.      3. Additional findings:  *  STIR hyperintense fluid collection in the right glenohumeral joint/subacromial region may represent bursitis. Correlate with symptoms/physical exam.     OVERALL ASSESSMENT: BI-RADS CATEGORY 4 - SUSPICIOUS.    05/03/2021 MR cervical spine  Redemonstrated congenital cervical spinal canal stenosis with superimposed multilevel degenerative changes, which are mildly progressive compared to prior as above. No evidence of cord compression or cord signal abnormality.     03/14/2021 NM stress test  1) There is a moderate-severe, fixed perfusion defect in the distal anteroseptal wall and apex suggestive for a prior MI.      No scintigraphic evidence for stress-induced ischemia.       2) Normal left ventricular size and reduced global LV systolic function, EF 36%. There are regional wall motion abnormalities present as indicated above.     3) For physiologic and electrocardiographic findings, please refer to a separate report.     4) There are no prior studies available for comparison.      12/18/2020 CT abd/pelvis wo contrast  1.  No acute CT abnormality in the abdomen or pelvis.  2.  Moderate hiatal hernia with postsurgical changes likely related to reported prior Nissen fundoplication.  3.  Left-sided diverticulosis without evidence diverticulitis.  4.  History of bilateral breast cancer without evidence of metastatic disease within the abdomen or pelvis.    12/18/2020 CTA chest   1.  No acute aortic process. No acute process within the chest.  2.  History of bilateral breast cancer  without evidence of intrathoracic metastatic disease.  3.  Additional findings as above.    11/18/2020 MRI breast bilat  1. Right breast:   *  Stable to slightly decreased size of known biopsy proven malignancies in the right breast. BI-RADS CATEGORY 6 - KNOWN BIOPSY-PROVEN MALIGNANCY. Recommend continued surgical and oncologic management.      2. Left breast:  *  Decreased size of the irregular mass with microclip at 11:00, 5 cm from the nipple, consistent with known malignancy. BI-RADS CATEGORY 6 - KNOWN BIOPSY-PROVEN MALIGNANCY. Recommend continued surgical and oncologic management.   *  Nonmass enhancement spanning 40 mm has also slightly decreased in size and now demonstrates subthreshold kinetics. This remains suspicious. If breast conservation is desired, an MRI guided biopsy is recommended. BI-RADS CATEGORY 4 - SUSPICIOUS.  *  Previously described oval enhancing mass at 4:00 is no longer identified. BI-RADS CATEGORY 2.     OVERALL ASSESSMENT: BI-RADS CATEGORY 4 - SUSPICIOUS.    08/26/2020 DXA lumbar spine/hip  -----------------------------------------------------------------   Region                   BMD    T-score  Z-score   Classification   -----------------------------------------------------------------   AP Spine(L1-L4)          1.062    0.1      2.9       Normal   Femoral Neck (Left)      0.723   -1.1      1.3       Osteopenia   Total Hip (Left)         0.725   -1.8      0.4       Osteopenia   -----------------------------------------------------------------   Impression: The BMD for  the AP Spine(L1-L4) decreased, changing by -2.4% since the last DXA exam.     08/22/2020 PETCT  1.  Mild to moderate left greater than right FDG activity associated with small soft tissue densities in the bilateral breasts in the regions marked by breast biopsy clips.   2.  No evidence of FDG avid metastatic disease.    06/19/2019 US pelvis  Small subserosal uterine fundal fibroid. Otherwise normal postmenopausal pelvic ultrasound.        Pertinent pathology:  03/21/2022 Tissue exam  FINAL DIAGNOSIS  BREAST, RIGHT, CALCIFICATIONS, 10:00, 2 CM FROM NIPPLE (MAMMO-GUIDED CORE  BIOPSY):  - Atypical ductal hyperplasia (ADH)  - Background breast with fibrosis, microcysts, and apocrine metaplasia  - Calcifications are present within ADH, apocrine metaplasia, and benign stroma  - No carcinoma identified (multiple deeper levels examined     08/02/2020 [facility] genetics      07/13/2020 Tissue exam  A. BREAST, RIGHT, MASS, 12:00, 6 CM FROM NIPPLE (NEEDLE CORE BIOPSY):  - Invasive ductal carcinoma with lobular and focal cribriform features, grade 2 (50% of biopsy; longest segment 4 mm)             Modified Bloom and Richardson score: 6 of 9                        Tubule formation:                3                        Nuclear pleomorphism:       2  Mitotic score:                       1  - Ductal carcinoma in situ (DCIS), low nuclear grade, cribriform type with focal central necrosis  - Breast biomarkers: See below  - ERBB2 (HER2) FISH: Negative for HER2 gene amplification      B.  BREAST, LEFT, MASS, 11:00, 5 CM FROM NIPPLE (NEEDLE CORE BIOPSY):  - Invasive ductal carcinoma with lobular features, grade 2 (50% of biopsy; longest involved segment 6 mm)             Modified Bloom and Richardson score: 6 of 9                        Tubule formation:                3                        Nuclear pleomorphism:       2                        Mitotic score:                       1  - Ductal carcinoma in situ (DCIS) is not identified  - Breast biomarkers: See immunohistochemistry report below  - ERBB2 (HER2) FISH: Negative [based on IHC (2+) and FISH, see comment].     COMMENT: It is uncertain whether patients with an average of >4.0 and <6.0 HER2 signals per cell and a HER2/CEP17 ratio of <2.0 benefit from HER2-targeted therapy in the absence of protein overexpression (IHC 3+). If the specimen test result is close to the [facility] ratio threshold for positive, there is a higher likelihood that repeat testing will result in different results by chance alone. Therefore, when [facility] results are not 3+ positive, it is recommended that the sample be considered negative without additional testing on the specimen.    BREAST BIOMARKER REPORT     BLOCK:  A1  FIXATIVE:  Formalin      RESULT ER/PR:    ESTROGEN   RECEPTOR PROGESTERONE RECEPTOR   Antibody Clone SP1 Clone 636   %Tumor Staining >95% >95%   Intensity (1+ to 3+) 3+ 3+      RESULT HER-2/neu IHC assay (utilizing FDA-approved DAKO Hercep Test): Arch Pathol Lab Med [phone], ASCO/CAP HER2 Testing in Breast Cancer Update - Evelene Croon et al     Test Score: 1+     HER2/neu:  Negative     RESULT Ki-67 (Clone MIB1):  5%     BREAST BIOMARKER REPORT     BLOCK:  B1  FIXATIVE:  Formalin      RESULT ER/PR:    ESTROGEN   RECEPTOR PROGESTERONE RECEPTOR   Antibody Clone SP1 Clone 636   %Tumor Staining >95% 0%   Intensity (1+ to 3+) 3+ NA      RESULT HER-2/neu IHC assay (utilizing FDA-approved DAKO Hercep Test): Arch Pathol Lab Med 2018:1-20, ASCO/CAP HER2 Testing in Breast Cancer Update - Veronia Beets al     Test Score: 2+     HER2/neu:  Equivocal     RESULT Ki-67 (Clone MIB1):  10%    07/19/2017 Outside pathology        ASSESSMENT & PLAN   Arwyn Besaw is a 87 y.o. female  presents for     1. 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 She elected not to have surgery at that time. She took tamoxifen -->anastrozole for a short period of time, but discontinued endocrine therapy due to side effects.   - R breast biopsy at 12:00, 6 cm from nipple on 07/13/2020 revealed invasive ductal carcinoma with lobular and focal cribriform features, grade 2 ER (+) PR (+) HER2 (-), Ki-67 5%  - L breast biopsy at 11:00, 5 cm from nipple on 07/13/2020 revealed invasive ductal carcinoma with lobular features, grade 2 ER (+) PR (-) HER2 (-), Ki-67 10%  - We had a long discussion about biology and treatment of breast cancer, with emphasis that cure can only be attained with multimodality treatments such as surgery/ +/- radiation and systemic ( endocrine) therapy .  We have discussed that given her concern for anesthesia with Myasthenia Gravis, we can try neoadjuvant endocrine therapy with close observation of side effects.  - Letrozole 2.5 mg po daily 08/17/2020 --> 10/28/2020 discontinued due to diarrhea  - Tumors are not palpable on exam, and she would have to be followed with imaging   -  PETCT on 08/22/2020 with no evidence of FDG avid metastatic disease.   - Arimidex 10/28/2020 --->   - Breast MRI on 11/18/2020 with stable to slightly decreased size of known biopsy proven malignancies in the right breast, decreased size of the irregular mass with microclip at 11:00, 5 cm from the nipple, consistent with known malignancy, nonmass enhancement spanning 40 mm has also slightly decreased in size and now demonstrates subthreshold kinetics, this remains suspicious, previously described oval enhancing mass at 4:00 is no longer identified.   - CT CAP on 12/18/2020 without evidence of metastatic disease   - Breast MRI on 11/18/2020 with stable to slightly decreased size of known biopsy proven malignancies in the right breast, decreased size of the irregular mass with microclip at 11:00, 5 cm from the nipple, consistent with known malignancy, nonmass enhancement spanning 40 mm has also slightly decreased in size and now demonstrates subthreshold kinetics, this remains suspicious, previously described oval enhancing mass at 4:00 is no longer identified.  - Held Arimidex 1 mg po daily d/t diarrhea 8/23 - 11/13/2021   - Patient has opted against breast surgery d/t age, co morbidities, anaesthetic risk iso myasthenia gravis  - She would like to explore alternatives to surgery such as radiation. Discussed case with rad/onc team and will obtain repeat breast imaging. She consulted with Dr. Lindajo Royal who advised that she is not currently a candidate for RT  - MR abd/pelv 11/23/2021 (-)   - Will try switching to Exemestane 25mg  po daily to evaluate for improvement in diarrhea- suspect class effect, but will try 11/14/2021 --->    05/22/2022   - Continue exemestane 25mg  po daily   - Repeat labs today   - Diagnostic bilateral MMG/US ordered to assess status of disease. Scans 01/26/2022 with biopsy proven malignancies decreased in size sonographically. Increasing calcifications right breast compared to prior mammogram. Unclear if this is progression of disease as prior MRI demonstrates trace residual enhancement. If breast conservation surgery is of interest, stereotactic biopsy may be performed. Right breast probably benign mass at 12:00 6 cm FN stable, recommend follow up US in 6 months. Probably benign masses of left breast  at 12:00 3 cm FN and 1:00 1 cm FN are stable, recommend  follow up US in 6 months. Stable thickened left axillary lymph node, continued surgical/oncologic consultation is recommended  - CNB of right breast mass 03/21/2022. Pathology shows ADH   - 6 month f/u US ordered - due around 07/2022       2.  Diarrhea  - Longstanding, hx of IBS, but exacerbated by AI use, similar on letrozole and arimidex.  - Consult with Dr. Riley Kill 02/10/2021. May be related to aromatase inhibitor. However she states it has been worse in the last 1-2 months and this medication class was started earlier in the year (attributed diarrhea to letrozole, swithced to arimidex in Sept). Will r/o infection, check FC. Can consider SIBO evaluation, colonoscopy for microscopic colitis.   - Held anastrazole 10/18/2021-->11/13/2021 with resolution of diarrhea.   - MR Abd/Pelv for further assessment given >20lbs weight loss associated with diarrhea 9/28 (-) for metastatic disease.   - Strongly encouraged patient to submit stool studies ordered by Dr. Riley Kill  - Will start with 1/2 tablet imodium q AM and increase to 1 tablet daily as needed  - Consider lomotil if imodium is ineffective or not tolerated  -  Switched to exemestane 11/14/2021 -->       3.  Myasthenia gravis   - F/u w Dr. Claudie Leach, Neurology, on 08/15/2021. Referral for PT regarding neck exercise. Referral for Acupuncture. Physical Therapy referral for gait and balance training for cervical spinal stenosis. Contact number for scheduling: [phone]. Continue IVIg 60g (1g/kg) frequency to every 3 weeks at home with Allur.Continue Azathioprine 75 mg twice per day by mouth.       4.  Chest pain  - Hospital admission 10/23-10/25/2022. P/w atypical chest pain x 2-3 days with new LBBB but negative troponin and relief w/ antacid medications most c/f upper GI pathology such as severe GERD, PUD, or gastritis. CT AP without acute abnormality. Initiated pantoprazole 40 mg PO bid with improvement in symptoms with PPI and GI cocktail w/maalox. Patient able to tolerate all meals without recurrent chest pain. GI was consulted and given improvement in symptoms, EGD deferred. Continue Maalox q4h PRN. H pylori stool Ag discontinued given long term PPI use. Continue dexelant outpatient. Ondansetron 4 mg PO q6hr PRN Rx for nausea.   - Outpatient GI follow-up scheduled w Dr. Riley Kill on 01/12/2021        5. Bone density  - DXA scan on 08/26/2020 demonstrated osteopenia of the left femoral neck and total hip       DIAGNOSES/ORDERS addressed on 05/22/2022:  1. Malignant neoplasm of overlapping sites of both breasts in female, estrogen receptor positive (HCC/RAF)      - on exemestane  - continue close monitoring for toxicity of intensive drug therapy.    NEW PROBLEMS AS OF 05/22/2022:     []  New minor or self-limited problem (99202/99212).  []  2+ minor or self-limited problem, stable chronic illness, acute uncomplicated illness/injury (99203/99213).  []  Chronic illness with exacerbation/progression/tx side effect; 2+ stable chronic illnesses, new problem with uncertain prognosis, acute illness with systemic symptoms, or acute complicated injury (99204/99214).  []  Chronic illness with severe exacerbation/progression/tx side effect, acute/chronic illness or injury that poses threat to life or bodily function (99205/99215).      REVIEW OF DATA   I have  [x]  Reviewed/ordered []  1 []  2 [x]  ? 3 unique laboratory, radiology, and/or diagnostic tests noted below  Orders Placed This Encounter    [place]  left breast, diagnostic     Standing Status:   Future     Standing Expiration Date:   07/22/2023     Order Specific Question:   Where would you like to schedule this procedure?     Answer:   Radiology     Order Specific Question:   Reason for exam:     Answer:   6 month f/u     Order Specific Question:   Order Panel?     Answer:   Yes     Order Specific Question:   I authorize the Radiologist to modify the parameters of this test as medically necessary based on the clinical indications for the study. This includes the administration of contrast.     Answer:   Yes    [place] right breast, diagnostic     Standing Status:   Future     Standing Expiration Date:   07/22/2023     Order Specific Question:   Where would you like to schedule this procedure?     Answer:   Radiology     Order Specific Question:   Reason for exam:     Answer:   6 month f/u     Order Specific Question:   Order Panel?     Answer:   Yes     Order Specific Question:   I authorize the Radiologist to modify the parameters of this test as medically necessary based on the clinical indications for the study. This includes the administration of contrast.     Answer:   Yes    CBC & Auto Differential     Standing Status:   Future     Standing Expiration Date:   05/20/2023    Vitamin D,25-Hydroxy     Standing Status:   Future     Standing Expiration Date:   05/20/2023       [x]  Reviewed []  1 []  2 [x]  ? 3 prior external notes and incorporated into patient assessment   I reviewed Dr. Velvet Bathe, MD in Surgery, Surgical Oncology on 03/07/2022.  I reviewed Dr. York Ram, MD in Medicine, Internal Medicine on 07/13/2020.  I reviewed Dr. Not Found in Not Found on Not Found.    []  Discussed management or test interpretation with external provider(s) on 05/22/2022 as noted:    RISK OF COMPLICATION   This writer has deemed the above diagnoses to have a risk of complication, morbidity or mortality of:   []  Minimal  []  Low  [x]  Moderate   []  due to prescription drug management   [] Decision re: minor surgery   [] Diagnosis or treatment limited by social determinants of health  []  Severe    []  due to intensive monitoring for chemotherapy toxicity   []  Decision elective surgery with risk factors  []  Decision regarding need for hospitalization  []  Advanced Care Planning decisions    SOCIAL DETERMINANTS OF HEALTH   []  The diagnosis or treatment of said conditions is significantly limited by social determinants of health:      I spent 30 minutes counseling and coordinating care for the items checked below on the day of service 05/22/2022  [x]  Preparing to see the patient (e.g., review of tests)  [x]  Obtaining and or reviewing separately obtained history   [x]  Performing a medically appropriate examination and/or evaluation   [x]  Counseling and educating the patient/family/caregiver   [x]  Ordering medications, tests, or procedures  []  Referring and communicating with other healthcare professionals (  when not separately reported)  [x]  Documenting clinical information in the EHR  []  Independently interpreting results and communicating results to patient/ family/caregiver     No follow-ups on file.       The above recommendation were discussed with the patient. The patient has all questions answered satisfactorily and is in agreement with this recommended plan of care.

## 2022-05-21 NOTE — Telephone Encounter
Call Back Request      Reason for call back: Patient called in to reschedule her follow up appointment she missed back on 04/03/22 when she got Covid.   Can you please call her back to schedule that?      CBN: [phone]  Thank you.    Any Symptoms:  []  Yes  [x]  No      If yes, what symptoms are you experiencing:    Duration of symptoms (how long):    Have you taken medication for symptoms (OTC or Rx):      If call was taken outside of clinic hours:    [] Patient or caller has been notified that this message was sent outside of normal clinic hours.     [] Patient or caller has been warm transferred to the physician's answering service. If applicable, patient or caller informed to please call us back if symptoms progress.  Patient or caller has been notified of the turnaround time of 1-2 business day(s).

## 2022-05-22 ENCOUNTER — Ambulatory Visit: Payer: BLUE CROSS/BLUE SHIELD | Attending: Hematology & Oncology

## 2022-05-23 ENCOUNTER — Inpatient Hospital Stay: Payer: BLUE CROSS/BLUE SHIELD

## 2022-05-23 DIAGNOSIS — R0602 Shortness of breath: Secondary | ICD-10-CM

## 2022-05-28 DIAGNOSIS — E1122 Type 2 diabetes mellitus with diabetic chronic kidney disease: Secondary | ICD-10-CM

## 2022-05-28 DIAGNOSIS — N1832 Type 2 diabetes mellitus with stage 3b chronic kidney disease, without long-term current use of insulin (HCC/RAF): Secondary | ICD-10-CM

## 2022-05-29 ENCOUNTER — Ambulatory Visit: Payer: BLUE CROSS/BLUE SHIELD | Attending: Geriatric Medicine

## 2022-05-29 NOTE — Progress Notes
OUTPATIENT GERIATRICS CLINIC NOTE    PATIENT:  [name]   MRN:  [id number]  DOB:  March 30, 1935  DATE OF SERVICE:  05/29/2022  PRIMARY CARE PHYSICIAN: York Ram., MD    CHIEF COMPLAINT:   No chief complaint on file.      [facility]:  [employer]    Non[facility] [place] [facility]:    Chaperone status:  No data recorded    HISTORY OF PRESENT ILLNESS     [name] is a 87 y.o. female who presents today for *follow up**. Patient is accompanied by *no one**. History today is per the patient,and review of available recent records in [facility] and Care Everywhere.     Patient is a(n) [x]  reliable []  unreliable historian and additional collateral information was obtained during the visit today from:       Recent bout with COVID.  Marland Kitchen          Per chart review, pertinent medical history:  Past Medical History:   Diagnosis Date    Breast cancer (HCC/RAF) 12/20/2018     No past surgical history on file.    ALLERGIES     Allergies   Allergen Reactions    Beta Adrenergic Blockers Other (See Comments)     Avoid d/t Myasthenia Gravis  Avoid d/t Myasthenia Gravis      Levofloxacin Other (See Comments)     Flare of Myasthenia Gravis    Macrolides And Ketolides Other (See Comments)     Avoid d/t Myasthenia Gravis  Use with caution d/t Myasthenia Gravis  Use with caution d/t Myasthenia Gravis      Sulfa Antibiotics Other (See Comments)     May possibly have caused deafness in one ear  May possibly have caused deafness in one ear  other      Botulinum Toxins Other (See Comments)     Muscle weakness r/t Myasthenia Gravis  Muscle weakness r/t Myasthenia Gravis      Statins Other (See Comments)     Muscle aches  Muscle aches  Muscle aches      Plastic Tape Itching and Other (See Comments)     Turns the skin red where applied        MEDICATIONS     Personally reviewed.    No outpatient medications have been marked as taking for the 05/29/22 encounter (Appointment) with York Ram., MD.       SOCIAL HISTORY     Social History Social History Narrative    Not on file        FUNCTIONAL STATUS     BADLs:  IADLs:    Ambulates with   Falls in past year:  Afraid of falling:    GERIATRIC REVIEW OF SYSTEMS     Vision:    []   glasses     []  legally blind  Hearing: []  hearing aids    Nutrition: []  normal   []  impaired  []  vegan  []  vegetarian  []  low salt  []  low-carb   Swallowing: []  impaired   Dentures:   []  yes    Depression:  []  yes   Cognition:     Incontinence: [] urine  []  fecal  []  urine and fecal      ADVANCED CARE PLANNING     Advanced directives on file: []  No  []   Yes ? Completed:     Medical DPOA on file:   []   Yes ?   []   No ? Patient designates ** *  to be their surrogate medical decision-maker.         HEALTH CARE MAINTENANCE     IMMUNIZATIONS:   Immunization History   Administered Date(s) Administered    COVID-19 [employer] Fall 2023 12y and up) PF, 30 mcg/0.3 mL 03/27/2022    COVID-19, mRNA, (Pfizer - Purple Cap) 30 mcg/0.3 mL 03/11/2019, 04/01/2019, 10/29/2019    COVID-19, mRNA, bivalent (Pfizer) 30 mcg/0.3 mL (12y and up) 11/25/2020, 07/05/2021    COVID-19, mRNA, tris-sucrose (Pfizer - [employer]) 30 mcg/0.3 mL 06/20/2020    DTaP 12/31/2015    influenza vaccine IM quadrivalent (Fluzone Quad) MDV (20 months of age and older) 11/26/2016    influenza vaccine IM quadrivalent adjuvanted (FluAD Quad) (PF) SYR (72 years of age and older) 11/05/2018, 12/01/2019, 11/25/2020    influenza vaccine IM quadrivalent high dose (Fluzone High Dose Quad) (PF) SYR (51 years of age and older) 11/22/2021    influenza, unspecified formulation 05/10/2014, 12/29/2015, 11/26/2016, 04/01/2019, 04/01/2019, 12/01/2019, 12/01/2019, 12/01/2019, 12/01/2019, 11/25/2020, 11/25/2020    pneumococcal conjugate vaccine 20-valent (Prevnar 20) 07/05/2021    pneumococcal polysaccharide vaccine 23-valent (Pneumovax) 01/10/2019       Mammogram:  DXA:  PAP:  Colonoscopy:      PHYSICAL EXAM     There were no vitals taken for this visit.  Wt Readings from Last 3 Encounters: 05/22/22 140 lb (63.5 kg)   05/03/22 137 lb 9.6 oz (62.4 kg)   03/27/22 138 lb (62.6 kg)         System Check if normal Positive or additional negative findings   GEN  [x]  NAD     Eyes  []  Conj/Lids []  Pupils  []  Fundi   []  Sclerae []  EOM     ENT  []  External ears   []  Otoscopy   []  Gross Hearing    []   External nose   []  Nasal mucosa   []  Lips/teeth/gums    []  Oropharynx    []  Mucus membranes      Neck  []  Inspection/palpation    []  Thyroid     Resp  []  Effort    []  Auscultation       CV  []  Rhythm/rate   []  Murmurs   []  Edema   []  JVP non-elevated    Normal pulses:   []  Radial []  Femoral  []  Pedal     Breast  []  Inspection []  Palpation     GI  []  Bowel sounds    []  Nontender   []  No distension    []  No rebound or guarding   []  No masses   []  Liver/spleen    []  Rectal     GU  F:  []  External []  vaginal wall         []  Cervix     []  mucus        []  Uterus    []  Adnexa   M:  []  Scrotum []  Penis         []  Prostate     Lymph  []  Cervical []  supraclavicular     []  Axillae   []  Groin/inguinal     MSK []  Gait  []  Back     Specify site examined:    []  Inspect/palp []  ROM   []  Stability []  Strength/tone []  Used arms to push up from seated to standing position   Assistive device:  []  single point cane []  quad cane  []  FWW []  rollator walker      Skin  []   Inspection []  Palpation     Neuro  []  Alert and oriented     []  CN2-12 intact grossly   []  DTR      []  Muscle strength      []  Sensation   []  Pronator drift   []  Finger to Nose/Heel to Shin   []  Romberg     Psych  []  Insight/judgement     []  Mood/affect    []  Gross cognition            LABS/STUDIES     LABS:  Lab Results   Component Value Date    WBC 5.89 03/27/2022    WBC 5.37 01/03/2022    WBC 4.7 10/18/2021    HGB 12.8 03/27/2022    HGB 12.8 01/03/2022    HGB 12.7 10/18/2021    MCV 98.7 (H) 03/27/2022    PLT 358 03/27/2022    PLT 339 01/03/2022    PLT 379 (H) 10/18/2021     Lab Results   Component Value Date    NA 138 05/05/2022    NA 139 05/03/2022    NA 138 03/27/2022    K 4.6 05/05/2022    K 4.3 05/03/2022    K 4.4 03/27/2022    CREAT 1.67 (H) 05/05/2022    CREAT 1.89 (H) 05/03/2022    CREAT 1.86 (H) 03/27/2022    GFRESTNOAA 29 06/09/2020    GFRESTNOAA 28 05/18/2020    GFRESTNOAA 30 03/23/2020    GFRESTAA 34 06/09/2020    GFRESTAA 33 05/18/2020    GFRESTAA 35 03/23/2020    CALCIUM 9.5 05/05/2022    CALCIUM 9.6 05/03/2022    CALCIUM 9.6 03/27/2022     Lab Results   Component Value Date    ALT 14 05/03/2022    ALT 14 03/27/2022    AST 21 05/03/2022    AST 27 03/27/2022    ALKPHOS 81 05/03/2022    BILITOT <0.2 05/03/2022    ALBUMIN 4.0 05/03/2022    ALBUMIN 4.0 03/27/2022     Lab Results   Component Value Date    TSH 2.3 01/03/2022    TSH 2.7 09/01/2021    TSH 1.5 03/16/2021     Lab Results   Component Value Date    HGBA1C 5.3 05/03/2022    HGBA1C 5.2 09/01/2021    HGBA1C 5.1 03/16/2021     Lab Results   Component Value Date    CHOL 226 01/07/2022    CHOL 237 09/01/2021    CHOL 199 06/03/2021     Lab Results   Component Value Date    CHOLDLQ 144 (H) 01/07/2022    CHOLDLQ 147 (H) 09/01/2021    CHOLDLQ 128 (H) 06/03/2021     Lab Results   Component Value Date    VITD25OH 76 10/18/2021     Lab Results   Component Value Date    FE 87 07/14/2019    FERRITIN 57 07/14/2019    FOLATE 9.6 11/25/2019    TIBC 356 07/14/2019     Lab Results   Component Value Date    VITAMINB12 3,994 (H) 11/25/2019    VITAMINB12 217 (L) 09/22/2019     Lab Results   Component Value Date    BNP 156 (H) 06/03/2021    BNP 92 09/22/2019     No results found for: ''PSATOTAL''      STUDIES:      XR CHEST PA LAT 2V  September 22, 2019   COMPARISON: [facility] December 17, 2018     History: sob     FINDINGS:     Lungs: Clear  Heart/aorta: Normal heart size. The thoracic aorta is mildly calcified, tortuous and ectatic.  Adenopathy: None  Pleura: No effusion  Bones and Chest wall: No acute bony or body wall  findings. Osteopenia and bony maturational changes. Right upper quadrant cholecystectomy clips stable from October 2020        IMPRESSION:     No acute findings or abnormalities related to provided history.      MRI C spine  May 03, 2021  IMPRESSION:  Redemonstrated congenital cervical spinal canal stenosis with superimposed multilevel degenerative changes, which are mildly progressive compared to prior as above. No evidence of cord compression or cord signal abnormality.      Echo  Jan 2023  1. Normal left ventricular size.  2. There are LV regional wall motion abnormalities.  3. Left ventricular ejection fraction is approximately 35 to 40%.  4. Abnormal LV diastolic function.  5. There are no prior studies on this patient for comparison purposes.  ASSESSMENT and PLAN     Lamira Borin is a 87 y.o. female who presents today for *follow up**.    #Major Depression--active--self d/ced duloxetine.  Rechallenge.  Tolerating Duloxetine.  #HFrEF--EF 35-40%noted on echo-- 2023--Not Worsening.  Chronic.  Will monitor and treat underlying risk factors with medical and lifestyle interventions as warranted by balance between benefits and burdens.   #CAD--discussed medical management alone with shared decision making model May 11, 2021.  Declines staint  #Cervical myelopathy--monitored by neuro--chronic--re-imaged on MRI as above.  CTM.  #HTN--Increased nifedipine CR 60 mg daily. Continue losartan 50 mg.  BP Better.    #CKD--stage 4--chronic--CTM--control BP as tolerated by symptoms of orthostasis and fatigue.  Trial off trimethoprim.  REcheck Cr now and in two weeks.  #CAD--patient leaning towards medical management.  #Aortic Calcification--noted on chest imaging     September 22, 2019        --Not Worsening.  Chronic.  Will monitor and treat underlying risk factors with medical and lifestyle interventions as warranted by balance between benefits and burdens.  #allergic rhinitis--trial flonase  #Macrocytosis--  #Deconditioning--initiate home PT  #GERD--s/p Nisan fundoplication  #COPD--mixed--per PFT's from [name].--albuterol prn. May resume other inhalders.  #s/p rectocele repair  #s/p cholecystectomy  #Immunosupressed status--medication related.  Chronic. CTM  #Nocturnal hypoxemia--presumably related to MG crisis but has history of COPD.  Will order nocturnal home O2 study and consider PFT's/Pulmonary evaluation once settled down.  May need to restart BREO inhaler.  #Myasthenia Gravis--s/p plasmapheresis x5 October 2020, no Thymoma, on Azathioprine 150 daily and mestinon 60 tid.  Saw Dr. Enedina Finner at [facility].  Was considering Soliris.  Ordering IVIG and MRI.  Encourage ongoing neurology follow up.   #IBS--titrate miralax to comfort.    #Interstitial cystitis--stopping daily Trimethoprim 100 for UTI prophylaxis.  Using bladder instillations prn. OFF topical estrogen due to advice from neurologist re: MG.  To see Dr. Lorenz Coaster.  Suspect recent flare related to breast CA re-diagnosis.  Doubt pseudomonas bactiuria is pathogen at present.  WIll complete current meropenum.  #Allergic rhinitis--previously on nasal steroids.  #Breast CA--under care.  Patient continues to decline surgery.  Wondering about additional treatment options.  Will direct to med-onc for discussion.  #DM with CKD--stabe 3b--based on labs from [name].  FOllow here.  If BP permits, may consider ARB BUT patient with h/o angioedema so will ONLY initiate this agent with great caution.  Refer to Ophthalmology.  #OA knees--previously getting hyaluronic acid injections.  #Anxiety  #Benzodiazepine Dependence--uncomplicated--chronic.  Encourage patient to decrease intake--ideally to abstaining but will work towards this goal over time.  Not likely to achieve this given ongoing concerns re: breast ca.  #Fibromyalgia--duloxetine as above.   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  #Statin intolerance      FOLLOW-UP     RTC     Future Appointments   Date Time Provider Department Center   05/29/2022  3:20 PM York Ram., MD GERI IMS [address] [facility]/Cen   06/08/2022 12:00 PM SM 2020 ECHO 03 CARDIOLOGY CARDIAC IMAGING CI [id number]   06/12/2022  2:00 PM Clelia Croft, MD CRD DIS 2020 [facility]   07/10/2022 11:30 AM Luis Abed., OD DS VIS [facility] [place]/Cen         The above plan of care, diagnosis, orders, and follow-up were discussed with the patient and/or surrogate. Questions related to this recommended plan of care were answered.    ANALYSIS OF DATA (Needs to meet 1 category for moderate and 2 categories for high LOS)     I have:     Category 1 (Needs 3 for moderate and high LOS)     []  Reviewed []  1 []  2 []  ? 3 unique laboratory, radiology, and/or diagnostic tests noted below    Test/Study:  on date .    []  Reviewed []  1 []  2 []  ? 3 prior external notes and incorporated into patient assessment    I reviewed Dr. 's note in specialty  from date .    []  Discussed management or test interpretation with external provider(s) as noted      []  Ordered []  1 []  2 []  ? 3 unique laboratory, radiology, and/or diagnostic tests noted in A&P    []  Obtained history from independent historian:       Category 2  []  Independently interpreted the test    Category 3  []  Discussed management or test interpretation with external provider(s) as noted      INTERACTION COMPLEXITY and SOCIAL DETERMINANTS of HEALTH       PROBLEM COMPLEXITY   []  New problem with uncertain diagnosis or prognosis (moderate)   []  Multiple stable chronic problems (moderate)   []  Chronic problem not stable - not controlled, symptomatic, or worsening (moderate)   []  Severe exacerbation of chronic problem (high)   []  New or chronic problem that poses threat to life or bodily function (high)     MANAGEMENT COMPLEXITY   []  Old or external/outside records reviewed   []  Discussion with alternate (proxy) if patient with impaired communication / comprehension ability (e.g., dementia, aphasia, severe hearing loss).   []  Repeated questions (or disagreement) between patient and/among caregivers/family during the visit.  []  Caregiver/patient emotions/behavior/beliefs interfering with implementation of treatment plan.  []  Independent interpretation of test (EKG, Chest XRay)   []  Discussion of case with a consultant physician     RISK LEVEL   []  Prescription drug management (moderate)   []  Minor surgery with CV risk factors or elective major surgery (moderate)   []  Dx or Rx significantly limited by SDoH (inadequate housing, living alone, poor health care access, inappropriate diet; low literacy) (moderate)   []  Major surgery - elective with CV risk factors or emergent (high)   []  Need for hospitalization (high)   []  New DNR or de-escalation of care (high)      SDoH  The diagnosis or treatment of  said conditions is significantly limited by the following social determinants of health:  []  Z59.0 Homelessness  []  Z59.1 Inadequate housing  []  Z59.2 Discord with neighbors, lodgers and landlord  []  Z60.2 Problems related to living alone  []  Z59.8 Other problems related to housing and economic circumstances  []  Z59.4 Lack of adequate food and safe drinking water  []  Z59.6 Low income  []  Z59.7 Insufficient social insurance and welfare support  []  Z59.9 Problems related to housing and economic circumstances, unspecified  []  Z75.3 Unavailability and inaccessibility of health care facilities  []  Z75.4 Unavailability and inaccessibility of other helping agencies  []  Z72.4 Inappropriate diet and eating habits  []  Z62.820 Parent-biological child conflict  []  Z63.8 Other specified problems related to primary support group  []  Z55.0 Illiteracy and low level literacy   []  Z56.9 Unspecified problems related to employment        If Billing Based on Time:     I performed the following items on the day of service:    [x]  Preparing to see the patient (e.g., review of tests)  [x]  Obtaining and/or reviewing separately obtained history   [x]  Performing a medically appropriate examination and/or evaluation   [x]  Counseling and educating the patient/family/caregiver   [x]  Ordering medications, tests, or procedures  [x]  Referring and communicating with other healthcare professionals (when not separately reported)  [x]  Documenting clinical information in the EHR  []  Independently interpreting results and communicating results to patient/family/caregiver    I spent the following total amount of time on these tasks on the day of service:  New Patient     Established Patient  []  15-29 minutes - 99202    []  up to 9 minutes - 99211  []  30-44 minutes - 99203     []  10-19 minutes - 99212   []  45-59 minutes - 99204      []  20-29 minutes - 99213   []  60-74 minutes - 99205   [x]  30- minutes - 99214         []  40-55 minutes - 99215    []  I spent an additional ** * 15-minute-increment(s) for a total of * ** minutes on these tasks on the day of service. [phone] for each additional 15 minutes.)    Author: York Ram, MD 05/29/2022    [phone] billed today:    I have a longitudinal care relationship with this patient and am the focal point of ongoing patient care related to the serious chronic and complex condition(s) assessed today as noted above in the note.   I assume responsibility of ongoing management of said condition(s) over time.

## 2022-06-01 NOTE — Telephone Encounter
Called and scheduled appointment with patient. No further action is needed.

## 2022-06-04 NOTE — Telephone Encounter
Call Back Request      Reason for call back: Billy from [facility] is calling to speak to [facility] in clinic reagrding patient' cover my meds key code that he left with Rejeana Brock last week in clinic and wanted to inquire is Rejeana Brock has completed it.   Please call Billy back to advise.   Per Genevie Cheshire the Key Code in question will expire tomorrow 06/05/22- urgently requesting a response.     CBN: [phone] Billy (direct line)       Any Symptoms:  []  Yes  [x]  No      If yes, what symptoms are you experiencing:    Duration of symptoms (how long):    Have you taken medication for symptoms (OTC or Rx):      If call was taken outside of clinic hours:    [] Patient or caller has been notified that this message was sent outside of normal clinic hours.     [] Patient or caller has been warm transferred to the physician's answering service. If applicable, patient or caller informed to please call us back if symptoms progress.  Patient or caller has been notified of the turnaround time of 1-2 business day(s).

## 2022-06-05 ENCOUNTER — Telehealth: Payer: MEDICARE

## 2022-06-05 ENCOUNTER — Ambulatory Visit: Payer: BLUE CROSS/BLUE SHIELD

## 2022-06-05 NOTE — Telephone Encounter
PDL Call to Clinic    Reason for Call: [facility] calling for [name] to follow up on a cover my meds request he states he gave her in person to submit so it wouldn't expire but has not had any follow up since    Appointment Related?  []  Yes  [x]  No     If yes;  Date:  Time:    Call warm transferred to PDL: []  Yes  [x]  No    Call Received by Clinic Representative:stephanie    If call not answered/not accepted, call received by Patient Services Representative:

## 2022-06-05 NOTE — Telephone Encounter
PDL resolved by PS team  PS Team resolved PDL    []  PS team warm transferred call to PDL    Call received by clinic Representative:     []  Clinic staff provided PS team with info to relay to patient-request resolved    []   PS team assisted patient with request    Patient request:      See encounter below. Cover my med code was given to [place] last week.

## 2022-06-06 NOTE — Telephone Encounter
Gamunex PA submitted through covermymeds:

## 2022-06-08 ENCOUNTER — Telehealth: Payer: BLUE CROSS/BLUE SHIELD

## 2022-06-08 NOTE — Telephone Encounter
Call Back Request      Reason for call back: Pt's daughter Fara Boros is calling to speak to office regarding patient's infusion.   Daughter explained patient usually gets them done every 2-3 weeks, but has not had one done since January.     Fara Boros CBN:  [phone]   Any Symptoms:  []  Yes  [x]  No      If yes, what symptoms are you experiencing:    Duration of symptoms (how long):    Have you taken medication for symptoms (OTC or Rx):      If call was taken outside of clinic hours:    [] Patient or caller has been notified that this message was sent outside of normal clinic hours.     [] Patient or caller has been warm transferred to the physician's answering service. If applicable, patient or caller informed to please call us back if symptoms progress.  Patient or caller has been notified of the turnaround time of 1-2 business day(s).

## 2022-06-08 NOTE — Telephone Encounter
Approval received for patient's Gamunex medication:

## 2022-06-12 ENCOUNTER — Ambulatory Visit: Payer: BLUE CROSS/BLUE SHIELD

## 2022-06-12 DIAGNOSIS — I10 Essential (primary) hypertension: Secondary | ICD-10-CM

## 2022-06-12 DIAGNOSIS — I5022 Chronic systolic (congestive) heart failure: Secondary | ICD-10-CM

## 2022-06-12 NOTE — Consults
CARDIOLOGY NOTE      PATIENT: [name]   MRN: [id number]   DOB: [birth year]   DATE OF SERVICE: 06/12/2022    PCP:  York Ram., MD      Problem List Items Addressed This Visit       Aortic calcification (HCC/RAF) - Primary     Other Visit Diagnoses       Essential hypertension        Systolic CHF, chronic (HCC/RAF)                  Interval History:     The patient feels SOB/dyspnea and tired  ? If has been using Repatha   Ambulates with a walker  ET: 1/2 block, stable     Prior Encounter:     This is a 87 y.o. female who is here for cardiac evaluation.  Indication:   SOB    The patient is a/w friend, Jacobo Forest    Pt was recently hospitalized with SOB and chest pain.   Pt ambulates with a walker for precautionary reasons given history of falls.   With activity, the patient reports SOB/dypnea. She walks the dog 1/2 block  Her symptoms have been ongoing for the past 3 years, progressive worsening.   The patient notes allergies and asthma.   About 1 year ago, she could walk 1.5 blocks         Past Medical History:   Diagnosis Date    Breast cancer (HCC/RAF) 12/20/2018       No past surgical history on file.    Allergies:   Allergies   Allergen Reactions    Beta Adrenergic Blockers Other (See Comments)     Avoid d/t Myasthenia Gravis  Avoid d/t Myasthenia Gravis      Levofloxacin Other (See Comments)     Flare of Myasthenia Gravis    Macrolides And Ketolides Other (See Comments)     Avoid d/t Myasthenia Gravis  Use with caution d/t Myasthenia Gravis  Use with caution d/t Myasthenia Gravis      Sulfa Antibiotics Other (See Comments)     May possibly have caused deafness in one ear  May possibly have caused deafness in one ear  other      Botulinum Toxins Other (See Comments)     Muscle weakness r/t Myasthenia Gravis  Muscle weakness r/t Myasthenia Gravis      Statins Other (See Comments)     Muscle aches  Muscle aches  Muscle aches      Plastic Tape Itching and Other (See Comments)     Turns the skin red where applied       Social History     Socioeconomic History    Marital status: Widowed   Tobacco Use    Smoking status: Former     Types: Cigarettes     Quit date: 1985     Years since quitting: 39.3    Smokeless tobacco: Former   Substance and Sexual Activity    Alcohol use: Not Currently    Drug use: Never     Social Determinants of Health     Financial Resource Strain: Low Risk  (04/12/2022)    Financial Resource Strain     Difficulty of Paying Living Expenses: Not hard at all       No family history on file.  Adopted     Objective:     BP 122/71  ~ Pulse 96  ~ Wt 136 lb 6.4  oz (61.9 kg)  ~ SpO2 (!) 90%  ~ BMI 22.70 kg/m?     Vitals:    06/12/22 1423   BP: 122/71   Pulse: 96   SpO2: (!) 90%   Weight: 136 lb 6.4 oz (61.9 kg)       BP Readings from Last 3 Encounters:   06/12/22 122/71   05/29/22 145/60   05/22/22 142/76       Body mass index is 22.7 kg/m?Marland Kitchen    Wt Readings from Last 3 Encounters:   06/12/22 136 lb 6.4 oz (61.9 kg)   05/29/22 138 lb 8 oz (62.8 kg)   05/22/22 140 lb (63.5 kg)          General Exam: no apparent distress.    Neurologic/Psychiatric: alert and oriented. Affect is appropriate.  HEENT: Sclerae are anicteric. There is no epistaxis.  Cardiac: regular in rate and rhythm.    Respiratory: non-labored breathing. No use of accessory muscles. No wheeze.  Abdomen: bowel sounds are present. Abdomen is soft. No rebound.    Extremities: no edema. No cyanosis or clubbing.    Integument: no petechiae. No acute rash.         Laboratory:  Lab Results   Component Value Date    NA 138 05/05/2022    K 4.6 05/05/2022    CL 105 05/05/2022    CO2 22 05/05/2022    BUN 24 (H) 05/05/2022    CREAT 1.67 (H) 05/05/2022    GLUCOSE 88 05/05/2022    CALCIUM 9.5 05/05/2022     Lab Results   Component Value Date    CKTOT 54 01/07/2022    CKTOT 50 09/01/2021    TROPONIN 0.05 (H) 12/19/2020    TROPONIN 0.07 (H) 12/19/2020    TROPONIN 0.06 (H) 12/18/2020     Lab Results   Component Value Date    BNP 156 (H) 06/03/2021     Lab Results   Component Value Date    WBC 5.89 03/27/2022    HGB 12.8 03/27/2022    HCT 38.5 03/27/2022    MCV 98.7 (H) 03/27/2022    PLT 358 03/27/2022     Lab Results   Component Value Date    CHOL 226 01/07/2022    CHOLHDL 70 01/07/2022    CHOLDLQ 144 (H) 01/07/2022    TRIGLY 68 01/07/2022     Lab Results   Component Value Date    PT 12.9 12/18/2020    INR 1.0 12/18/2020     No components found for: ''HA1C''  Lab Results   Component Value Date    TSH 2.3 01/03/2022       Patient Active Problem List   Diagnosis    Acute medial meniscal tear, left, subsequent encounter    Angioedema    Anxiety    Asthmatic bronchitis, mild persistent, with acute exacerbation    Chronic cholecystitis    Chronic interstitial cystitis    CKD (chronic kidney disease), stage III (HCC/RAF)    COPD mixed type (HCC/RAF)    Major depression, recurrent, chronic (HCC/RAF)    Diabetes mellitus (HCC/RAF)    Esophageal reflux    Eustachian tube dysfunction    Fibromyalgia    H/O arthroscopy    Insomnia    Myasthenia gravis with exacerbation (HCC/RAF)    Nocturnal hypoxemia    Osteoarthritis of knee    Renal cyst    Seasonal and perennial allergic rhinitis    Spondylosis without myelopathy or radiculopathy, lumbar region  Urethral caruncle    Vaginal atrophy    Benzodiazepine dependence (HCC/RAF)    Breast cancer (HCC/RAF)    Refractive error    Ptosis of eyelid    Pseudophakia of both eyes    Aortic calcification (HCC/RAF)    HTN (hypertension)    Immunosuppressed status (HCC/RAF)    Acute chest pain    Atypical chest pain    Heart failure with reduced ejection fraction LVEF <=40% (HCC/RAF)    Epiretinal membrane, bilateral    PVD (posterior vitreous detachment), bilateral    Chorioretinal scars, bilateral       Outpatient Medications Prior to Visit   Medication Sig    Albuterol Sulfate AEPB Takes 1 to 2 puffs every 4 hours as needed for wheezing or SOB Inhale Takes 1 to 2 puffs every 4 hours as needed for wheezing or SOB ..    azaTHIOprine 50 mg tablet Take one and one-half tablet (75 mg total) by mouth two (2) times daily.    clonazePAM 1 mg tablet TAKE 1 TABLET BY MOUTH AT NIGHT.    dapagliflozin 10 mg tablet Take 1 tablet (10 mg total) by mouth daily.    DEXILANT 60 MG DR capsule TAKE 1 CAPSULE (60 MG TOTAL) BY MOUTH DAILY    DULoxetine 20 mg DR capsule Take 3 capsules (60 mg total) by mouth daily.    Evolocumab (REPATHA SURECLICK) 140 MG/ML SOAJ Inject 1 pen. under the skin every fourteen (14) days.    exemestane 25 mg tablet Take 1 tablet (25 mg total) by mouth daily.    fluticasone-vilanterol (BREO ELLIPTA) 100-25 mcg/inh inhaler Inhale 1 puff daily.    GAMUNEX-C 20 GM/200ML infusion     losartan 100 mg tablet Take 1 tablet (100 mg total) by mouth daily.    montelukast 10 mg tablet Take 1 tablet (10 mg total) by mouth as needed for.    PYRIDOSTIGMINE 60 mg tablet TAKE 1 TABLET (60 MG TOTAL) BY MOUTH THREE (3) TIMES DAILY AS NEEDED    WELLBUTRIN SR 200 MG 12 hr tablet TAKE 1 TABLET (200 MG TOTAL) BY MOUTH TWO (2) TIMES DAILY     No facility-administered medications prior to visit.       Current Outpatient Medications   Medication Sig    Albuterol Sulfate AEPB Takes 1 to 2 puffs every 4 hours as needed for wheezing or SOB Inhale Takes 1 to 2 puffs every 4 hours as needed for wheezing or SOB ..    azaTHIOprine 50 mg tablet Take one and one-half tablet (75 mg total) by mouth two (2) times daily.    clonazePAM 1 mg tablet TAKE 1 TABLET BY MOUTH AT NIGHT.    dapagliflozin 10 mg tablet Take 1 tablet (10 mg total) by mouth daily.    DEXILANT 60 MG DR capsule TAKE 1 CAPSULE (60 MG TOTAL) BY MOUTH DAILY    DULoxetine 20 mg DR capsule Take 3 capsules (60 mg total) by mouth daily.    Evolocumab (REPATHA SURECLICK) 140 MG/ML SOAJ Inject 1 pen. under the skin every fourteen (14) days.    exemestane 25 mg tablet Take 1 tablet (25 mg total) by mouth daily.    fluticasone-vilanterol (BREO ELLIPTA) 100-25 mcg/inh inhaler Inhale 1 puff daily.    GAMUNEX-C 20 GM/200ML infusion     losartan 100 mg tablet Take 1 tablet (100 mg total) by mouth daily.    montelukast 10 mg tablet Take 1 tablet (10 mg total) by mouth as needed  for.    PYRIDOSTIGMINE 60 mg tablet TAKE 1 TABLET (60 MG TOTAL) BY MOUTH THREE (3) TIMES DAILY AS NEEDED    WELLBUTRIN SR 200 MG 12 hr tablet TAKE 1 TABLET (200 MG TOTAL) BY MOUTH TWO (2) TIMES DAILY     No current facility-administered medications for this visit.             2018 ACC/AHA guidelines recommends high-intensity statin because of ASCVD diagnosis. Patient is intolerant to statin. Consider non-statin Rx or referral.  10-Year ASCVD risk N/A. Age > 75. as of 2:47 PM on 06/12/2022.  10-Year ASCVD risk with optimal risk factors cannot be calculated.  Values used to calculate ASCVD score:  Age: 87 y.o. Cannot calculate risk because age is not between 5 and 61 years old.  Gender: Female Race: White  70 mg/dL. (measured on 01/07/2022)  226 mg/dL. (measured on 01/07/2022)  144 mg/dL. (measured on 01/07/2022)  122 mm Hg. (measured on 06/12/2022)  Yes  currently not a smoker  Yes  Click here for the [facility] ASCVD Cardiovascular Risk Estimator Plus tool (online calculator).      Assessment:     Ellouise Mcwhirter is 87 y.o. female with    Cardiomyopathy, EF 35-40%. Echo in 2023    - avoiding BBlockers due to myasthenia gravis    Abnormal Myoview in 2023: There is a moderate-severe perfusion defect in the distal anteroseptal wall and apex, suggestive for MI.    SOB/dyspnea. Uses oxygen concentrator  Abnormal PFTs, h/o COPD   Abnormal EKG with LBBB and PACs  Former tobacco use  Subtle calcifications of the aortic root. Moderately calcified thoracic aorta and its main branch vessels.  Intolerant to Statins    Plan:     Losartan 100 mg  ASA    Repatha - discussed compliance   Roderic Palau, [name], MD  Clelia Croft, MD  Hi Camaria Gerald.   She and I had a thoughtful discussion today.  In light of her breast cancer and her Myasthenia Gravis, it seems like medical management of her CAD is best.         Orders Placed This Encounter    Lipid Panel    Chol,LDL,Quant    ALT (SGPT)    Potassium    eGFR by Creatinine    CK, Total    ECG 12-Lead Clinic Performed           Return in about 3 months (around 09/11/2022).      There are no Patient Instructions on file for this visit.    Future Appointments   Date Time Provider Department Center   07/05/2022  1:20 PM York Ram., MD GERI I[facility] [address] [place]/Cen   07/10/2022 11:30 AM Anne Hahn Y., OD DS VIS REHAB [place]/Cen   07/10/2022  2:20 PM York Ram., MD GERI IMS [address] [place]/Cen   08/28/2022 11:00 AM NEU MG CLINIC [facility] NEUMUSC [address] [place]/Cen         Author: Clelia Croft, MD

## 2022-06-13 ENCOUNTER — Telehealth: Payer: BLUE CROSS/BLUE SHIELD

## 2022-06-13 ENCOUNTER — Ambulatory Visit: Payer: BLUE CROSS/BLUE SHIELD

## 2022-06-13 NOTE — Telephone Encounter
Message to Practice/Provider      Message: Susann Givens from [facility] wants to f/u on the prior authorization for rx [facility] SR 200 MG, it was faxed on 04/11    Susann Givens also provided the key number [facility]      Return call is not being requested by the patient or caller.    Patient or caller has been notified of the turnaround time of 1-2 business day(s).

## 2022-06-14 MED ORDER — BUPROPION HCL ER (SR) 200 MG PO TB12
200.0 mg | ORAL_TABLET | Freq: Two times a day (BID) | ORAL | 3 refills | Status: AC
Start: 2022-06-14 — End: ?

## 2022-06-14 NOTE — Telephone Encounter
Placed call to patient and informed medication has been approved    Pt sends thanks for assisting in timely manner and will call her pharmacy

## 2022-06-14 NOTE — Telephone Encounter
Auth approved     Sent pt message to advise medication has been approved and to contact pharmacy

## 2022-06-19 ENCOUNTER — Ambulatory Visit: Payer: BLUE CROSS/BLUE SHIELD

## 2022-07-03 ENCOUNTER — Ambulatory Visit: Payer: BLUE CROSS/BLUE SHIELD

## 2022-07-03 NOTE — Progress Notes
Assessment and Plan      Problem List        Eye/Vision Problems    1. Refractive error     Overview      07/03/2022 - Dispensed spectacle prescription for distance and optional   near.  Dispensed retaine for visual stability, particularly when eyes are   fatigued          Current Assessment & Plan                2. Pseudophakia of both eyes     Overview      Patient endorses history of movement during CEIOL with atypical lens   placment right eye. Lens appears stable and centered right eye, slightly   superior displaced but stable right eye. Monitor            Other    3. Myasthenia gravis with exacerbation (HCC/RAF) - Primary     Overview      05/18/2022  Stable with treatment, no double vision. Referral to optom for glasses.   All risks, benefits, and alternatives discussed with patient. Follow up   when needed                Orders:  No orders of the defined types were placed in this encounter.      Follow ups:  Return in about 1 year (around 07/03/2023).         I discussed the above assessment and plan with the patient. She had the opportunity to ask questions, and her questions and concerns were addressed. She was reminded to call if there is any significant change or worsening in vision, or to get an evaluation, urgently if appropriate.    Author:   Luis Abed, OD  This note details the assessment and plan of the encounter for this date. For a complete note and record of the encounter, please see the Encounter Summary.

## 2022-07-04 MED ORDER — DULOXETINE HCL 20 MG PO CPEP
60 mg | ORAL_CAPSULE | Freq: Every day | ORAL | 3 refills
Start: 2022-07-04 — End: ?

## 2022-07-05 ENCOUNTER — Ambulatory Visit: Payer: BLUE CROSS/BLUE SHIELD | Attending: Geriatric Medicine

## 2022-07-05 MED ORDER — DULOXETINE HCL 20 MG PO CPEP
60 mg | ORAL_CAPSULE | Freq: Every day | ORAL | 3 refills | Status: AC
Start: 2022-07-05 — End: ?

## 2022-07-05 NOTE — Progress Notes
OUTPATIENT GERIATRICS CLINIC NOTE    PATIENT:  [name]   MRN:  [id number]  DOB:  [birth year]  DATE OF SERVICE:  07/05/2022  PRIMARY CARE PHYSICIAN: York Ram., MD    CHIEF COMPLAINT:   No chief complaint on file.      [place] [facility]:  [employer]    Non-[place] [facility]:    Chaperone status:  No data recorded    HISTORY OF PRESENT ILLNESS     [name] is a 87 y.o. female who presents today for *follow up**. Patient is accompanied by *no one**. History today is per the patient,and review of available recent records in [facility] and Care Everywhere.     Patient is a(n) [x]  reliable []  unreliable historian and additional collateral information was obtained during the visit today from:           .Has not had IVIG in months.          Per chart review, pertinent medical history:  Past Medical History:   Diagnosis Date    Breast cancer (HCC/RAF) 12/20/2018     No past surgical history on file.    ALLERGIES     Allergies   Allergen Reactions    Beta Adrenergic Blockers Other (See Comments)     Avoid d/t Myasthenia Gravis  Avoid d/t Myasthenia Gravis      Levofloxacin Other (See Comments)     Flare of Myasthenia Gravis    Macrolides And Ketolides Other (See Comments)     Avoid d/t Myasthenia Gravis  Use with caution d/t Myasthenia Gravis  Use with caution d/t Myasthenia Gravis      Sulfa Antibiotics Other (See Comments)     May possibly have caused deafness in one ear  May possibly have caused deafness in one ear  other      Botulinum Toxins Other (See Comments)     Muscle weakness r/t Myasthenia Gravis  Muscle weakness r/t Myasthenia Gravis      Statins Other (See Comments)     Muscle aches  Muscle aches  Muscle aches      Plastic Tape Itching and Other (See Comments)     Turns the skin red where applied        MEDICATIONS     Personally reviewed.    No outpatient medications have been marked as taking for the 07/05/22 encounter (Appointment) with York Ram., MD.       SOCIAL HISTORY     Social History     Social History Narrative    Not on file        FUNCTIONAL STATUS     BADLs:  IADLs:    Ambulates with   Falls in past year:  Afraid of falling:    GERIATRIC REVIEW OF SYSTEMS     Vision:    []   glasses     []  legally blind  Hearing: []  hearing aids    Nutrition: []  normal   []  impaired  []  vegan  []  vegetarian  []  low salt  []  low-carb   Swallowing: []  impaired   Dentures:   []  yes    Depression:  []  yes   Cognition:     Incontinence: [] urine  []  fecal  []  urine and fecal      ADVANCED CARE PLANNING     Advanced directives on file: []  No  []   Yes ? Completed:     Medical DPOA on file:   []   Yes ?   []   No ? Patient designates ** * to be their surrogate medical decision-maker.         HEALTH CARE MAINTENANCE     IMMUNIZATIONS:   Immunization History   Administered Date(s) Administered    COVID-19 [employer] Fall 2023 12y and up) PF, 30 mcg/0.3 mL 03/27/2022    COVID-19, mRNA, (Pfizer - Purple Cap) 30 mcg/0.3 mL 03/11/2019, 04/01/2019, 10/29/2019    COVID-19, mRNA, bivalent (Pfizer) 30 mcg/0.3 mL (12y and up) 11/25/2020, 07/05/2021    COVID-19, mRNA, tris-sucrose (Pfizer - [employer]) 30 mcg/0.3 mL 06/20/2020    DTaP 12/31/2015    influenza vaccine IM quadrivalent (Fluzone Quad) MDV (55 months of age and older) 11/26/2016    influenza vaccine IM quadrivalent adjuvanted (FluAD Quad) (PF) SYR (28 years of age and older) 11/05/2018, 12/01/2019, 11/25/2020    influenza vaccine IM quadrivalent high dose (Fluzone High Dose Quad) (PF) SYR (31 years of age and older) 11/22/2021    influenza, unspecified formulation 05/10/2014, 12/29/2015, 11/26/2016, 04/01/2019, 04/01/2019, 12/01/2019, 12/01/2019, 12/01/2019, 12/01/2019, 11/25/2020, 11/25/2020, 01/03/2022    pneumococcal conjugate vaccine 20-valent (Prevnar 20) 07/05/2021    pneumococcal polysaccharide vaccine 23-valent (Pneumovax) 01/10/2019       Mammogram:  DXA:  PAP:  Colonoscopy:      PHYSICAL EXAM     There were no vitals taken for this visit.  Wt Readings from Last 3 Encounters:   06/12/22 136 lb 6.4 oz (61.9 kg)   05/29/22 138 lb 8 oz (62.8 kg)   05/22/22 140 lb (63.5 kg)         System Check if normal Positive or additional negative findings   GEN  [x]  NAD     Eyes  []  Conj/Lids []  Pupils  []  Fundi   []  Sclerae []  EOM     ENT  []  External ears   []  Otoscopy   []  Gross Hearing    []   External nose   []  Nasal mucosa   []  Lips/teeth/gums    []  Oropharynx    []  Mucus membranes      Neck  []  Inspection/palpation    []  Thyroid     Resp  []  Effort    []  Auscultation       CV  []  Rhythm/rate   []  Murmurs   []  Edema   []  JVP non-elevated    Normal pulses:   []  Radial []  Femoral  []  Pedal     Breast  []  Inspection []  Palpation     GI  []  Bowel sounds    []  Nontender   []  No distension    []  No rebound or guarding   []  No masses   []  Liver/spleen    []  Rectal     GU  F:  []  External []  vaginal wall         []  Cervix     []  mucus        []  Uterus    []  Adnexa   M:  []  Scrotum []  Penis         []  Prostate     Lymph  []  Cervical []  supraclavicular     []  Axillae   []  Groin/inguinal     MSK []  Gait  []  Back     Specify site examined:    []  Inspect/palp []  ROM   []  Stability []  Strength/tone []  Used arms to push up from seated to standing position   Assistive device:  []  single point cane []  quad cane  []   FWW []  rollator walker      Skin  []  Inspection []  Palpation     Neuro  []  Alert and oriented     []  CN2-12 intact grossly   []  DTR      []  Muscle strength      []  Sensation   []  Pronator drift   []  Finger to Nose/Heel to Shin   []  Romberg     Psych  []  Insight/judgement     []  Mood/affect    []  Gross cognition            LABS/STUDIES     LABS:  Lab Results   Component Value Date    WBC 5.89 03/27/2022    WBC 5.37 01/03/2022    WBC 4.7 10/18/2021    HGB 12.8 03/27/2022    HGB 12.8 01/03/2022    HGB 12.7 10/18/2021    MCV 98.7 (H) 03/27/2022    PLT 358 03/27/2022    PLT 339 01/03/2022    PLT 379 (H) 10/18/2021     Lab Results   Component Value Date    NA 138 05/05/2022    NA 139 05/03/2022    NA 138 03/27/2022    K 4.6 05/05/2022    K 4.3 05/03/2022    K 4.4 03/27/2022    CREAT 1.67 (H) 05/05/2022    CREAT 1.89 (H) 05/03/2022    CREAT 1.86 (H) 03/27/2022    GFRESTNOAA 29 06/09/2020    GFRESTNOAA 28 05/18/2020    GFRESTNOAA 30 03/23/2020    GFRESTAA 34 06/09/2020    GFRESTAA 33 05/18/2020    GFRESTAA 35 03/23/2020    CALCIUM 9.5 05/05/2022    CALCIUM 9.6 05/03/2022    CALCIUM 9.6 03/27/2022     Lab Results   Component Value Date    ALT 14 05/03/2022    ALT 14 03/27/2022    AST 21 05/03/2022    AST 27 03/27/2022    ALKPHOS 81 05/03/2022    BILITOT <0.2 05/03/2022    ALBUMIN 4.0 05/03/2022    ALBUMIN 4.0 03/27/2022     Lab Results   Component Value Date    TSH 2.3 01/03/2022    TSH 2.7 09/01/2021    TSH 1.5 03/16/2021     Lab Results   Component Value Date    HGBA1C 5.3 05/03/2022    HGBA1C 5.2 09/01/2021    HGBA1C 5.1 03/16/2021     Lab Results   Component Value Date    CHOL 226 01/07/2022    CHOL 237 09/01/2021    CHOL 199 06/03/2021     Lab Results   Component Value Date    CHOLDLQ 144 (H) 01/07/2022    CHOLDLQ 147 (H) 09/01/2021    CHOLDLQ 128 (H) 06/03/2021     Lab Results   Component Value Date    VITD25OH 76 10/18/2021     Lab Results   Component Value Date    FE 87 07/14/2019    FERRITIN 57 07/14/2019    FOLATE 9.6 11/25/2019    TIBC 356 07/14/2019     Lab Results   Component Value Date    VITAMINB12 3,994 (H) 11/25/2019    VITAMINB12 217 (L) 09/22/2019     Lab Results   Component Value Date    BNP 156 (H) 06/03/2021    BNP 92 09/22/2019     No results found for: ''PSATOTAL''      STUDIES:      XR CHEST PA  LAT 2V  September 22, 2019   COMPARISON: KUB December 17, 2018     History: sob     FINDINGS:     Lungs: Clear  Heart/aorta: Normal heart size. The thoracic aorta is mildly calcified, tortuous and ectatic.  Adenopathy: None  Pleura: No effusion  Bones and Chest wall: No acute bony or body wall  findings. Osteopenia and bony maturational changes. Right upper quadrant cholecystectomy clips stable from October 2020        IMPRESSION:     No acute findings or abnormalities related to provided history.      MRI C spine  May 03, 2021  IMPRESSION:  Redemonstrated congenital cervical spinal canal stenosis with superimposed multilevel degenerative changes, which are mildly progressive compared to prior as above. No evidence of cord compression or cord signal abnormality.      Echo  Jan 2023  1. Normal left ventricular size.  2. There are LV regional wall motion abnormalities.  3. Left ventricular ejection fraction is approximately 35 to 40%.  4. Abnormal LV diastolic function.  5. There are no prior studies on this patient for comparison purposes.  ASSESSMENT and PLAN     Andreana Klingerman is a 87 y.o. female who presents today for *follow up**.    #Major Depression--active--self d/ced duloxetine.  Rechallenge.  Tolerating Duloxetine.  #Myesthenia Gravis--worse.  Has not had Gamunex in 4 months. Will work with [facility] to facilitate restarting IVIG.  At present, patient is concerned that she may be financially responsible for infusions.  Will explore getting infusion at [facility] infusion center if that is more cost effective.  #HFrEF--EF 35-40%noted on echo-- 2023--Not Worsening.  Chronic.  Will monitor and treat underlying risk factors with medical and lifestyle interventions as warranted by balance between benefits and burdens.   #CAD--discussed medical management alone with shared decision making model May 11, 2021.  Declines staint  #Cervical myelopathy--monitored by neuro--chronic--re-imaged on MRI as above.  CTM.  #HTN--Increased nifedipine CR 60 mg daily. Continue losartan 50 mg.  BP Better.    #CKD--stage 4--chronic--CTM--control BP as tolerated by symptoms of orthostasis and fatigue.  Trial off trimethoprim.  REcheck Cr now and in two weeks.  #CAD--patient leaning towards medical management.  #Aortic Calcification--noted on chest imaging September 22, 2019--Not Worsening.  Chronic.  Will monitor and treat underlying risk factors with medical and lifestyle interventions as warranted by balance between benefits and burdens.  #allergic rhinitis--trial flonase  #Macrocytosis--  #Deconditioning--initiate home PT  #GERD--s/p Nisan fundoplication  #COPD--mixed--per PFT's from [name].--albuterol prn.  May resume other inhalders.  #s/p rectocele repair  #s/p cholecystectomy  #Immunosupressed status--medication related.  Chronic. CTM  #Nocturnal hypoxemia--presumably related to MG crisis but has history of COPD.  Will order nocturnal home O2 study and consider PFT's/Pulmonary evaluation once settled down.  May need to restart BREO inhaler.  #Myasthenia Gravis--s/p plasmapheresis x5 October 2020, no Thymoma, on Azathioprine 150 daily and mestinon 60 tid.  Saw Dr. Enedina Finner at [facility].  Was considering Soliris.  Ordering IVIG and MRI.  Encourage ongoing neurology follow up.   #IBS--titrate miralax to comfort.    #Interstitial cystitis--stopping daily Trimethoprim 100 for UTI prophylaxis.  Using bladder instillations prn. OFF topical estrogen due to advice from neurologist re: MG.  To see Dr. Lorenz Coaster.  Suspect recent flare related to breast CA re-diagnosis.  Doubt pseudomonas bactiuria is pathogen at present.  WIll complete current meropenum.  #Allergic rhinitis--previously on nasal steroids.  #Breast CA--under care.  Patient continues to  decline surgery.  Wondering about additional treatment options.  Will direct to med-onc for discussion.  #DM with CKD--stabe 3b--based on labs from [name].  FOllow here.  If BP permits, may consider ARB BUT patient with h/o angioedema so will ONLY initiate this agent with great caution.  Refer to Ophthalmology.  #OA knees--previously getting hyaluronic acid injections.  #Anxiety  #Benzodiazepine Dependence--uncomplicated--chronic.  Encourage patient to decrease intake--ideally to abstaining but will work towards this goal over time.  Not likely to achieve this given ongoing concerns re: breast ca.  #Fibromyalgia--duloxetine as above.   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  #Statin intolerance      FOLLOW-UP     RTC     Future Appointments   Date Time Provider Department Center   07/05/2022  1:20 PM York Ram., MD GERI IMS [address] [place]/Cen   08/28/2022 11:00 AM NEU MG CLINIC [facility] NEUMUSC [address] [place]/Cen   09/04/2022 11:45 AM 2020 2ND FLOOR, LAB PATH BLD2020 [facility]   09/11/2022  2:00 PM Clelia Croft, MD CRD DIS 2020 [facility]   07/02/2023 11:30 AM Luis Abed., OD DS VIS REH[facility] /Cen         The above plan of care, diagnosis, orders, and follow-up were discussed with the patient and/or surrogate. Questions related to this recommended plan of care were answered.    ANALYSIS OF DATA (Needs to meet 1 category for moderate and 2 categories for high LOS)     I have:     Category 1 (Needs 3 for moderate and high LOS)     []  Reviewed []  1 []  2 []  ? 3 unique laboratory, radiology, and/or diagnostic tests noted below    Test/Study:  on date .    []  Reviewed []  1 []  2 []  ? 3 prior external notes and incorporated into patient assessment    I reviewed Dr. 's note in specialty  from date .    []  Discussed management or test interpretation with external provider(s) as noted      []  Ordered []  1 []  2 []  ? 3 unique laboratory, radiology, and/or diagnostic tests noted in A&P    []  Obtained history from independent historian:       Category 2  []  Independently interpreted the test    Category 3  []  Discussed management or test interpretation with external provider(s) as noted      INTERACTION COMPLEXITY and SOCIAL DETERMINANTS of HEALTH       PROBLEM COMPLEXITY   []  New problem with uncertain diagnosis or prognosis (moderate)   []  Multiple stable chronic problems (moderate)   []  Chronic problem not stable - not controlled, symptomatic, or worsening (moderate)   []  Severe exacerbation of chronic problem (high)   []  New or chronic problem that poses threat to life or bodily function (high)     MANAGEMENT COMPLEXITY   []  Old or external/outside records reviewed   []  Discussion with alternate (proxy) if patient with impaired communication / comprehension ability (e.g., dementia, aphasia, severe hearing loss).   []  Repeated questions (or disagreement) between patient and/among caregivers/family during the visit.  []  Caregiver/patient emotions/behavior/beliefs interfering with implementation of treatment plan.  []  Independent interpretation of test (EKG, Chest XRay)   []  Discussion of case with a consultant physician     RISK LEVEL   []  Prescription drug management (moderate)   []  Minor surgery with CV risk factors or elective major surgery (moderate)   []  Dx or Rx significantly  limited by SDoH (inadequate housing, living alone, poor health care access, inappropriate diet; low literacy) (moderate)   []  Major surgery - elective with CV risk factors or emergent (high)   []  Need for hospitalization (high)   []  New DNR or de-escalation of care (high)      SDoH  The diagnosis or treatment of said conditions is significantly limited by the following social determinants of health:  []  Z59.0 Homelessness  []  Z59.1 Inadequate housing  []  Z59.2 Discord with neighbors, lodgers and landlord  []  Z60.2 Problems related to living alone  []  Z59.8 Other problems related to housing and economic circumstances  []  Z59.4 Lack of adequate food and safe drinking water  []  Z59.6 Low income  []  Z59.7 Insufficient social insurance and welfare support  []  Z59.9 Problems related to housing and economic circumstances, unspecified  []  Z75.3 Unavailability and inaccessibility of health care facilities  []  Z75.4 Unavailability and inaccessibility of other helping agencies  []  Z72.4 Inappropriate diet and eating habits  []  Z62.820 Parent-biological child conflict  []  Z63.8 Other specified problems related to primary support group  []  Z55.0 Illiteracy and low level literacy   []  Z56.9 Unspecified problems related to employment        If Billing Based on Time:     I performed the following items on the day of service:    [x]  Preparing to see the patient (e.g., review of tests)  [x]  Obtaining and/or reviewing separately obtained history   [x]  Performing a medically appropriate examination and/or evaluation   [x]  Counseling and educating the patient/family/caregiver   [x]  Ordering medications, tests, or procedures  [x]  Referring and communicating with other healthcare professionals (when not separately reported)  [x]  Documenting clinical information in the EHR  []  Independently interpreting results and communicating results to patient/family/caregiver    I spent the following total amount of time on these tasks on the day of service:  New Patient     Established Patient  []  15-29 minutes - 99202    []  up to 9 minutes - 99211  []  30-44 minutes - 99203     []  10-19 minutes - 99212   []  45-59 minutes - 99204      []  20-29 minutes - 99213   []  60-74 minutes - 99205   [x]  30- minutes - 99214         []  40-55 minutes - 99215    []  I spent an additional ** * 15-minute-increment(s) for a total of * ** minutes on these tasks on the day of service. [phone] for each additional 15 minutes.)    Author: York Ram, MD 07/05/2022    [phone] billed today:    I have a longitudinal care relationship with this patient and am the focal point of ongoing patient care related to the serious chronic and complex condition(s) assessed today as noted above in the note.   I assume responsibility of ongoing management of said condition(s) over time.

## 2022-07-10 ENCOUNTER — Telehealth: Payer: BLUE CROSS/BLUE SHIELD

## 2022-07-10 ENCOUNTER — Ambulatory Visit: Payer: BLUE CROSS/BLUE SHIELD | Attending: Geriatric Medicine

## 2022-07-10 ENCOUNTER — Ambulatory Visit: Payer: BLUE CROSS/BLUE SHIELD

## 2022-07-10 NOTE — Telephone Encounter
I contacted the patient to schedule NP appointment. (Provider transfer)     Hello,     I can take over her care. Please schedule with me as a new patient visit.    ThanksMaura Crandall    On Jul 10, 2022 3:37 PM, ''Mindi Curling.'' < wrote:  Hello Dr. Hermenia Fiscal,     Please see the ask below from [name].  Are you open to seeing this patient for a transfer of care?        Minna Antis  Operations Manager  [facility]  [address], [address]  [place], [place] [address]  [phone]  [email or web address]             From: Wilhelmenia Blase <  Sent: Thursday, Jul 05, 2022 4:13 PM  To: Mindi Curling. <  Subject: Dellie Burns or Varney Biles referral     Hello Fleet Contras,  This patient was cared for by Dr. Jake Shark. She is very displeased with the communication pattern from Dr. Marina Goodell, so we will need to switch neurologists. She has myasthenia gravis. This can go to Drs. Karasozen or Trikamji.     [name] [id number].      Thank you,  Tasia Catchings. Sylvie Farrier, M.D., Ph.D.  Professor and Chair  Sharen Hones Chair  Department of Neurology  [facility] of Medicine at [facility]

## 2022-07-11 ENCOUNTER — Telehealth: Payer: BLUE CROSS/BLUE SHIELD

## 2022-07-11 NOTE — Telephone Encounter
Call Back Request      Reason for call back: Patient had blood work done on 05/09 but appt isn't until 07/16. Patient wants to know if that's okay or should she repeat blood work again prior to July appt.     Any Symptoms:  []  Yes  [x]  No      If yes, what symptoms are you experiencing:    Duration of symptoms (how long):    Have you taken medication for symptoms (OTC or Rx):      If call was taken outside of clinic hours:    [] Patient or caller has been notified that this message was sent outside of normal clinic hours.     [] Patient or caller has been warm transferred to the physician's answering service. If applicable, patient or caller informed to please call us back if symptoms progress.  Patient or caller has been notified of the turnaround time of 1-2 business day(s).

## 2022-07-11 NOTE — Telephone Encounter
Dr. Hermenia Fiscal,  [facility] you for assuming the MG care of one of my favorite patients.  She has, through NO fault of hers, not been able to get her IVIG since the start of the year.  Might it be possible for you to order this in advance of meeting her?  She is happy to come to the infusion center or get it at home, whichever is easier.    Happy to discuss this off line by phone if easier.  My cell is [phone].  THANK YOU in advance!!

## 2022-07-13 ENCOUNTER — Ambulatory Visit: Payer: BLUE CROSS/BLUE SHIELD

## 2022-07-13 MED ORDER — TRIMETHOPRIM 100 MG PO TABS
100 mg | ORAL_TABLET | Freq: Every day | ORAL | 3 refills | 60.00000 days | Status: AC
Start: 2022-07-13 — End: ?

## 2022-07-20 ENCOUNTER — Ambulatory Visit: Payer: BLUE CROSS/BLUE SHIELD

## 2022-07-24 ENCOUNTER — Telehealth: Payer: BLUE CROSS/BLUE SHIELD | Attending: Student in an Organized Health Care Education/Training Program

## 2022-07-24 DIAGNOSIS — G7000 Myasthenia gravis without (acute) exacerbation: Secondary | ICD-10-CM

## 2022-07-24 NOTE — Telephone Encounter
NEUROMUSCULAR CLINIC NOTE    PATIENT:  [name]  MRN:  [id number]  DOB:  [birth year]  DATE OF SERVICE:  07/24/2022    REFERRING PHYSICIAN: No ref. provider found  PRIMARY CARE PHYSICIAN: York Ram., MD  REASON FOR VISIT: Myasthenia gravis    History of Present Illness:     [name] is a 87 y.o. 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 She will see me in person on 6/26 but we arranged a sooner telephone visit since she is worried about worsening symptoms. She states that she did not receive IVIG since January and has been feeling gradual worsening of myasthenia symptoms. She has been stumbling and feels her balance is off. She noted slight worsening of droopy eyelids. She is also experiencing blurry vision and double vision. She has no swallowing difficulty and usually does not have shortness of breath, except for a single recent episode that resolved with COPD medications.     She was diagnosed 8 years ago. At the time she was having significant double vision. She initially had ocular symptoms only, then over time developed generalized myasthenia symptoms. She had one acute exacerbation that required hospitalization and plasma exchange treatment. She was previously treated with combination of IVIG, azathioprine, and Mestinon. IVIG dosing was every 2 weeks for a long time but was adjusted to every 3 weeks in 2023. She has been off of IVIG since the beginning of 2024 for the past 6 months now. She remain on azathioprine. She is not on pyridostigmine as far as she can tell.    Past Medical History:  Past Medical History:   Diagnosis Date    Breast cancer (HCC/RAF) 12/20/2018        Past Surgical History:  No past surgical history on file.    Social History:  Social History     Socioeconomic History    Marital status: Widowed   Tobacco Use Smoking status: Former     Current packs/day: 0.00     Types: Cigarettes     Quit date: 1985     Years since quitting: 39.4    Smokeless tobacco: Former   Substance and Sexual Activity    Alcohol use: Not Currently    Drug use: Never     Social Determinants of Health     Financial Resource Strain: Low Risk  (04/12/2022)    Financial Resource Strain     Difficulty of Paying Living Expenses: Not hard at all     Family History:  No family history on file.    Allergies:  Allergies   Allergen Reactions    Beta Adrenergic Blockers Other (See Comments)     Avoid d/t Myasthenia Gravis  Avoid d/t Myasthenia Gravis      Levofloxacin Other (See Comments)     Flare of Myasthenia Gravis    Macrolides And Ketolides Other (See Comments)     Avoid d/t Myasthenia Gravis  Use with caution d/t Myasthenia Gravis  Use with caution d/t Myasthenia Gravis      Sulfa Antibiotics Other (See Comments)     May possibly have caused deafness in one ear  May possibly have caused deafness in one ear  other      Botulinum Toxins Other (See Comments)     Muscle weakness r/t Myasthenia Gravis  Muscle weakness r/t Myasthenia Gravis      Statins Other (  See Comments)     Muscle aches  Muscle aches  Muscle aches      Plastic Tape Itching and Other (See Comments)     Turns the skin red where applied       Current Medications:  Current Outpatient Medications   Medication Instructions    Albuterol Sulfate AEPB Takes 1 to 2 puffs every 4 hours as needed for wheezing or SOB Inhale Takes 1 to 2 puffs every 4 hours as needed for wheezing or SOB .    azaTHIOprine 50 mg tablet Take one and one-half tablet (75 mg total) by mouth two (2) times daily.    buPROPion (SR) (WELLBUTRIN SR) 200 mg, Oral, 2 times daily    clonazePAM 1 mg tablet TAKE 1 TABLET BY MOUTH AT NIGHT    dapagliflozin (FARXIGA) 10 mg, Oral, Daily    DEXILANT 60 mg, Oral, Daily    DULoxetine (CYMBALTA) 60 mg, Oral, Daily    Evolocumab (REPATHA SURECLICK) 140 MG/ML SOAJ 1 pen., Subcutaneous, Every 14 days    exemestane (AROMASIN) 25 mg, Oral, Daily    fluticasone-vilanterol (BREO ELLIPTA) 100-25 mcg/inh inhaler 1 puff, Inhalation, Daily    GAMUNEX-C 20 GM/200ML infusion No dose, route, or frequency recorded.    losartan (COZAAR) 100 mg, Oral, Daily    methenamine hippurate (HIPREX) 1 g, Oral, 2 times daily    montelukast (SINGULAIR) 10 mg, Oral, As needed for    PYRIDOSTIGMINE 60 mg tablet TAKE 1 TABLET (60 MG TOTAL) BY MOUTH THREE (3) TIMES DAILY AS NEEDED    trimethoprim (TRIMPEX) 100 mg, Oral, Daily     Review of Systems:  Complete review of systems otherwise negative, except as noted in HPI.    Physical Examination:   Vitals signs: There were no vitals taken for this visit.     Patient was interviewed over the phone. She has normal mental status. Her speech is mildly slurred.  Diagnostic Studies:     Recent labs:     Imaging:  No results found for this or any previous visit.    EDX:        Assessment:   [name] is a 87 y.o. 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 She has been off IVIG for the past 5-6 months and has worsening symptoms that are consistent with worsening of myasthenia gravis. We will restart the treatments as soon as possible to prevent myasthenic crisis that may require hospitalization again. We will repeat a loading dose, then continue every 3 weeks. I will evaluate her in person and we will assess her response and also consider alternative treatment options.     Plan:  - For now continue azathioprine at the same dose  - Restart IVIG   -- Loading dose of 2g/kg over 2 days   -- Followed by 1g/kg every 3 weeks  - In person visit as scheduled.    Note: Greater than 45 minutes spent on this encounter in consultation and in coordination of care.    Dellie Burns, MD.  Assistant Professor  [facility] Department of Neurology  07/24/2022 4:34 PM

## 2022-07-25 ENCOUNTER — Telehealth: Payer: BLUE CROSS/BLUE SHIELD

## 2022-07-25 NOTE — Telephone Encounter
IVIg request sent to [facility]

## 2022-07-30 ENCOUNTER — Ambulatory Visit: Payer: BLUE CROSS/BLUE SHIELD

## 2022-07-31 NOTE — Telephone Encounter
Order signed and faxed back to [facility]

## 2022-08-15 MED ORDER — PYRIDOSTIGMINE BROMIDE 60 MG PO TABS
ORAL_TABLET | 4 refills | 30.00000 days
Start: 2022-08-15 — End: ?

## 2022-08-16 ENCOUNTER — Telehealth: Payer: BLUE CROSS/BLUE SHIELD

## 2022-08-16 MED ORDER — PYRIDOSTIGMINE BROMIDE 60 MG PO TABS
ORAL_TABLET | 4 refills | Status: AC
Start: 2022-08-16 — End: ?

## 2022-08-16 NOTE — Telephone Encounter
Call Back Request      Reason for call back: Gaby with [facility] pharmacy called to follow up on the status of IVIG infusion orders.  Please call [phone] to assist. Thank you    Any Symptoms:  []  Yes  [x]  No      If yes, what symptoms are you experiencing:    Duration of symptoms (how long):    Have you taken medication for symptoms (OTC or Rx):      If call was taken outside of clinic hours:    [] Patient or caller has been notified that this message was sent outside of normal clinic hours.     [] Patient or caller has been warm transferred to the physician's answering service. If applicable, patient or caller informed to please call us back if symptoms progress.  Patient or caller has been notified of the turnaround time of 1-2 business day(s).

## 2022-08-16 NOTE — Telephone Encounter
Called and spoke with Robbie Louis to inquire on what orders she is referring to? She stated that it was for ordering provider, Dr. Claudie Leach   Advised her that I believe that patient is receiving services elsewhere and that I will get back to her to confirm asap    Message sent to [facility] with [facility] inquiring on if IVIg has been scheduled

## 2022-08-22 ENCOUNTER — Ambulatory Visit: Payer: BLUE CROSS/BLUE SHIELD | Attending: Student in an Organized Health Care Education/Training Program

## 2022-08-22 NOTE — Progress Notes
NEUROMUSCULAR CLINIC NOTE    PATIENT:  [name]  MRN:  [id number]  DOB:  [birth year]  DATE OF SERVICE:  08/22/2022    REFERRING PHYSICIAN: No ref. provider found  PRIMARY CARE PHYSICIAN: York Ram., MD  REASON FOR VISIT: Myasthenia gravis    History of Present Illness:     [name] is a 87 y.o. 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 She will see me in person on 6/26 but we arranged a sooner telephone visit since she is worried about worsening symptoms. She states that she did not receive IVIG since January and has been feeling gradual worsening of myasthenia symptoms. She has been stumbling and feels her balance is off. She noted slight worsening of droopy eyelids. She is also experiencing blurry vision and double vision. She has no swallowing difficulty and usually does not have shortness of breath, except for a single recent episode that resolved with COPD medications.     She was diagnosed 8 years ago. At the time she was having significant double vision. She initially had ocular symptoms only, then over time developed generalized myasthenia symptoms. She had one acute exacerbation that required hospitalization and plasma exchange treatment. She was previously treated with combination of IVIG, azathioprine, and Mestinon. IVIG dosing was every 2 weeks for a long time but was adjusted to every 3 weeks in 2023. She has been off of IVIG since the beginning of 2024 for the past 6 months now. She remains on azathioprine. She is not on pyridostigmine as far as she can tell.    Interval history 08/22/2022:  Previous visit was a telephone visit for me to review if restarting IVIG is appropriate. The order was approved and she is due for the initial loading dose of IVIG later this week. She continues to take azathioprine 75 mg BID. She also has a prescription for Mestinon 60 mg as needed up to 3 times a day. She is not sure if she is taking it because the medications are managed by the caregiver. She is either taking it on a regular schedule or not taking at all.    Currently she has no double vision, although this was a problem in the past. She feels her vision is a little blurry. She notices right sided ptosis when she is tired. Sometimes her daughter notices that she is slurring her words but she does not notice a difference herself. She is used to cutting solid food into very small pieces or eating soft foods. She feels chewing has been harder since she stopped getting IVIG. She has mild difficulty swallowing, which to her feels like her esophagus is smaller than it should be. She does not choke. Her chief complaint is feeling weak and tired. She is frequently feeling very tired. She is unsure how much of this is due to COPD. She gets shortness of breath easily with exertion. She has difficulty using her arms when she is tired. She feels legs are constantly a little weak. She always has to use her arms to get up from sitting position. She has been using a walker for the past 2-3 months. She denies any falls. She also complains of significant hand tremor that has been gradually getting worse. She notices this with fine tasks such as stitching. The tremor does not interfere with daily activities such as eating.    Myasthenia Gravis  Activities of Daily Living (MG-ADL)  None = 0  Mild = 1  Moderate = 2  Severe = 3     Talking:  None = 0: Normal  Mild = 1  Moderate = 2: Constant slurring or nasal speech, but can be understood  Severe = 3: Difficult to understand speech     Chewing:  None = 0: Normal   Mild = 1: Fatigue with solid food  Moderate = 2: Fatigue with soft food  Severe = 3: Gastric tube     Swallowing:  None = 0: Normal  Mild = 1: Rare episode of choking  Moderate = 2: Frequent choking necessitating changes in diet  Severe = 3: Gastric tube Breathing:  None = 0: Normal neeed  Mild = 1: Shortness of breath with exertion  Moderate = 2: Shortness of breath at rest  Severe = 3: Ventilator dependance     Impairment of ability to brush teeth or comb:  None = 0: None  Mild = 1: Extra effort, but no rest periods needed  Moderate = 2: Rest periods needed  Severe = 3: Cannot do one of these functions     Impairment of ability to arise from a chair:  None = 0: None  Mild = 1: Mild, sometimes uses arms  1  Moderate = 2: Moderate, always uses arms  Severe = 3: Severe, requires assistance     Double vision:  None = 0: None  Mild = 1: Occurs, but not daily  Moderate = 2: Daily, but not constant  Severe = 3: Constant     Eyelid droop  None = 0: None  Mild = 1: Occurs, but not daily  Moderate = 2: Daily, but not constant  Severe = 3: Constant     Total: 8 on 08/22/2022 (previously 7 on 08/15/2021)    Past Medical History:  Past Medical History:   Diagnosis Date    Breast cancer (HCC/RAF) 12/20/2018        Past Surgical History:  No past surgical history on file.    Social History:  Social History     Socioeconomic History    Marital status: Widowed   Tobacco Use    Smoking status: Former     Current packs/day: 0.00     Types: Cigarettes     Quit date: 1985     Years since quitting: 39.5    Smokeless tobacco: Former   Substance and Sexual Activity    Alcohol use: Not Currently    Drug use: Never     Social Determinants of Health     Financial Resource Strain: Low Risk  (04/12/2022)    Financial Resource Strain     Difficulty of Paying Living Expenses: Not hard at all     Family History:  No family history on file.    Allergies:  Allergies   Allergen Reactions    Beta Adrenergic Blockers Other (See Comments)     Avoid d/t Myasthenia Gravis  Avoid d/t Myasthenia Gravis      Levofloxacin Other (See Comments)     Flare of Myasthenia Gravis    Macrolides And Ketolides Other (See Comments)     Avoid d/t Myasthenia Gravis  Use with caution d/t Myasthenia Gravis  Use with caution d/t Myasthenia Gravis      Sulfa Antibiotics Other (See Comments)     May possibly have caused deafness in one ear  May possibly have caused deafness in one ear  other      Botulinum Toxins Other (See Comments)     Muscle weakness r/t Myasthenia Gravis  Muscle weakness r/t Myasthenia Gravis      Statins Other (See Comments)     Muscle aches  Muscle aches  Muscle aches      Plastic Tape Itching and Other (See Comments)     Turns the skin red where applied     Current Medications:  Current Outpatient Medications   Medication Instructions    Albuterol Sulfate AEPB Takes 1 to 2 puffs every 4 hours as needed for wheezing or SOB Inhale Takes 1 to 2 puffs every 4 hours as needed for wheezing or SOB .    azaTHIOprine 50 mg tablet Take one and one-half tablet (75 mg total) by mouth two (2) times daily.    buPROPion (SR) (WELLBUTRIN SR) 200 mg, Oral, 2 times daily    clonazePAM 1 mg tablet TAKE 1 TABLET BY MOUTH AT NIGHT    dapagliflozin (FARXIGA) 10 mg, Oral, Daily    DEXILANT 60 mg, Oral, Daily    DULoxetine (CYMBALTA) 60 mg, Oral, Daily    Evolocumab (REPATHA SURECLICK) 140 MG/ML SOAJ 1 pen., Subcutaneous, Every 14 days    exemestane (AROMASIN) 25 mg, Oral, Daily    fluticasone-vilanterol (BREO ELLIPTA) 100-25 mcg/inh inhaler 1 puff, Inhalation, Daily    GAMUNEX-C 20 GM/200ML infusion No dose, route, or frequency recorded.    losartan (COZAAR) 100 mg, Oral, Daily    methenamine hippurate (HIPREX) 1 g, Oral, 2 times daily    montelukast (SINGULAIR) 10 mg, Oral, As needed for    PYRIDOSTIGMINE 60 mg tablet TAKE 1 TABLET (60 MG TOTAL) BY MOUTH THREE (3) TIMES DAILY AS NEEDED    trimethoprim (TRIMPEX) 100 mg, Oral, Daily     Review of Systems:  Complete review of systems otherwise negative, except as noted in HPI.    Physical Examination:     Physical Examination:   Vitals signs: BP 138/65  ~ Pulse 85  ~ Temp 36.3 ?C (97.3 ?F) (Forehead)  ~ Resp 18  ~ Ht 5' 5'' (1.651 m)  ~ Wt 140 lb 9.6 oz (63.8 kg)  ~ BMI 23.40 kg/m? General: Well appearing, in no acute distress.   HEENT: Normocephalic. No dysmorphic features. Mucous membranes are moist.   Neck: Supple  Respiratory: Tachypnea and wheezing with any physical activity.   Abdomen: Soft, non-tender  Extremities: Warm and well perfused. No edema.   Skin: No rashes appreciated.    NEUROLOGIC EXAMINATION:  Mental Status: The patient is alert, oriented, and attentive, with normal concentration, memory, and speech. Fund of knowledge is appropriate.    Cranial Nerves: No ptosis at baseline, brief 1 mm ptosis on the right after 1 minute upgaze. Extraocular movements are full and conjugate. Reports mild hosrizontal diplopia after prolonged lateral gaze to the right. Facial sensation even and symmetric to light touch. Facial movements are symmetric and have normal strength with eye closure, smile and cheek puff bilaterally. Hearing intact to conversation. No hoarseness. Palate elevates symmetrically and uvula is at midline. Tongue protrudes at midline, moves laterally well and is full strength. No tongue fasciculations.    Motor: Normal muscle bulk and tone. There were no adventitious movements or fasciculations appreciated.   Cannot get up without using arms    Strength (MRC Scale)  Neck extensors 4+ **       Neck flexors 4 **        Right Left  Right  Left   Shoulder Int Rotation * * Hip Flexion 4 4   Shoulder Ext Rotation * * Hip Extension * *   Deltoid 4+ **  5 ** Hip Adduction 5 5   Biceps 5 5 Hip Abduction 4+ 4+   Triceps 5 5 Quadriceps 5 4+   Pronation * * Hamstrings 4+ 4+   Wrist Extension 5 5 Dorsiflexion 5 5   Wrist Flexion 5 5 Plantar Flexion 5 5   Finger Extension 5 5 Eversion * *   Interossei 5 5 Inversion * *   Finger Flexion (FDP) 5 5 EHL * *   Thumb Abduction 5 5 Toe Flexion * *   Thumb Flexion * * Toe Extension * *   Thumb Extension * *      * Not tested  ** Pain limited    Sensory: Pinprick normal.  Vibration minimally impaired on toes.  Romberg positive.    Coordination: Bilateral hand action tremor. Also present at jaw and lower extremities. No ataxia or dysmetria.    Reflexes:   DTR Right Left   Biceps 2+ 2+   Triceps 2+ 2+   Brachioradialis 2+ 2+   Knees 2+ 2+   Ankles 2+ 2+     Negative Babinski and Hoffman's sign bilaterally. Positive cross abductors    Gait:  Normal stance width and stride length. Normal heel, toe, and tandem walking.     Diagnostic Studies:     Labs:   08/26/2015: AChR binding (0.63) blocking (28) antibody, ESR 3, CRP 4.9     Imaging:  CT chest w/ contrast 12/08/2016: No evidence of thymoma    PFTs 05/23/2022:      EDX:  EMG/NCS 09/19/2020 with Dr. Claudie Leach:  Results:  1. Left median, left ulnar, left radial, right median, right ulnar, right radial and right superficial peroneal SNAPs were normal.  2. Left median, left ulnar, right median, right ulnar and right peroneal CMAPs were normal.  3. Left median and right median F-wave response latencies were normal.  4. Monopolar needle EMG of the right tibialis anterior was normal..  Impression:  This is a normal study. Specifically, there is no electrodiagnostic evidence of a neuropathy in this study.      Assessment:   [name] is a 87 y.o. 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 She has been off IVIG for the past 6 months and reports worsening symptoms that can be consistent with exacerbation of myasthenia gravis. Today on my examination she is mildly symptomatic from MG with proximal weakness and minimal bulbar symptoms, although she has a high MG-ADL. This can be due to other medical comorbidities, especially COPD. She also has significant action tremor, consistent with essential tremor. This is the first time she was given this diagnosis. We will defer treatment as this is not interfering with her daily activities sifnificantly and she wants to avoid adding more medicaitions. We will monitor her symptoms once she restarts IVIG.     Plan:  - Continue azathioprine 75 mg BID  - Restart IVIG   -- Loading dose of 2g/kg over 2 days   -- Followed by 1g/kg every 3 weeks  - Patient will find out if she is taking pyridostigmine on a schedule or not. We can consider adding PRN doses.  - Return to clinic in 2 months    Note: Greater than 60 minutes spent on this encounter in consultation and in coordination of care.    Dellie Burns,  MD.  Assistant Professor  [facility] Department of Neurology  08/22/2022 1:16 PM

## 2022-08-23 NOTE — Telephone Encounter
Call Back Request      Reason for call back: Gaby with [facility] calling back to check status of previous encounter, please assist, thank you    CB [phone]     Any Symptoms:  []  Yes  [x]  No      If yes, what symptoms are you experiencing:    Duration of symptoms (how long):    Have you taken medication for symptoms (OTC or Rx):      If call was taken outside of clinic hours:    [] Patient or caller has been notified that this message was sent outside of normal clinic hours.     [] Patient or caller has been warm transferred to the physician's answering service. If applicable, patient or caller informed to please call us back if symptoms progress.  Patient or caller has been notified of the turnaround time of 1-2 business day(s).

## 2022-08-23 NOTE — Telephone Encounter
Called and spoke with Gaby from [facility]   Informed her that patient is receiving services from another infusion provider   [name] will d/c pt from services

## 2022-08-24 DIAGNOSIS — G7000 Myasthenia gravis without (acute) exacerbation: Secondary | ICD-10-CM

## 2022-08-24 DIAGNOSIS — G250 Essential tremor: Secondary | ICD-10-CM

## 2022-08-28 ENCOUNTER — Ambulatory Visit: Payer: BLUE CROSS/BLUE SHIELD

## 2022-09-04 ENCOUNTER — Ambulatory Visit: Payer: BLUE CROSS/BLUE SHIELD

## 2022-09-11 ENCOUNTER — Ambulatory Visit: Payer: BLUE CROSS/BLUE SHIELD

## 2022-09-11 DIAGNOSIS — I10 Essential (primary) hypertension: Secondary | ICD-10-CM

## 2022-09-11 DIAGNOSIS — I5022 Chronic systolic (congestive) heart failure: Secondary | ICD-10-CM

## 2022-09-11 NOTE — Consults
CARDIOLOGY NOTE      PATIENT: [name]   MRN: [id number]   DOB: October 19, 1935   DATE OF SERVICE: 09/11/2022    PCP:  York Ram., [name]      Problem List Items Addressed This Visit       Aortic calcification (HCC/RAF)     Other Visit Diagnoses       Essential hypertension    -  Primary    Systolic CHF, chronic (HCC/RAF)                  Interval History:     The patient feels SOB/dyspnea and tired  She notes chest pain and fibromyalgia   Ambulates with a walker  ET: 1/2 block, stable     Prior Encounter:     This is a 87 y.o. female who is here for cardiac evaluation.  Indication:   SOB    The patient is a/w friend, [name]    Pt was recently hospitalized with SOB and chest pain.   Pt ambulates with a walker for precautionary reasons given history of falls.   With activity, the patient reports SOB/dypnea. She walks the dog 1/2 block  Her symptoms have been ongoing for the past 3 years, progressive worsening.   The patient notes allergies and asthma.   About 1 year ago, she could walk 1.5 blocks         Past Medical History:   Diagnosis Date    Breast cancer (HCC/RAF) 12/20/2018       No past surgical history on file.    Allergies:   Allergies   Allergen Reactions    Beta Adrenergic Blockers Other (See Comments)     Avoid d/t Myasthenia Gravis  Avoid d/t Myasthenia Gravis      Levofloxacin Other (See Comments)     Flare of Myasthenia Gravis    Macrolides And Ketolides Other (See Comments)     Avoid d/t Myasthenia Gravis  Use with caution d/t Myasthenia Gravis  Use with caution d/t Myasthenia Gravis      Sulfa Antibiotics Other (See Comments)     May possibly have caused deafness in one ear  May possibly have caused deafness in one ear  other      Botulinum Toxins Other (See Comments)     Muscle weakness r/t Myasthenia Gravis  Muscle weakness r/t Myasthenia Gravis      Statins Other (See Comments)     Muscle aches  Muscle aches  Muscle aches      Plastic Tape Itching and Other (See Comments)     Turns the skin red where applied       Social History     Socioeconomic History    Marital status: Widowed   Tobacco Use    Smoking status: Former     Current packs/day: 0.00     Types: Cigarettes     Quit date: 1985     Years since quitting: 39.5    Smokeless tobacco: Former   Substance and Sexual Activity    Alcohol use: Not Currently    Drug use: Never     Social Determinants of Health     Financial Resource Strain: Low Risk  (04/12/2022)    Financial Resource Strain     Difficulty of Paying Living Expenses: Not hard at all       No family history on file.  Adopted     Objective:     BP 129/48  ~ Pulse  96  ~ Ht 5' 5'' (1.651 m)  ~ Wt 138 lb 12.8 oz (63 kg)  ~ SpO2 95%  ~ BMI 23.10 kg/m?     Vitals:    09/11/22 1358   BP: 129/48   Pulse: 96   SpO2: 95%   Weight: 138 lb 12.8 oz (63 kg)   Height: 5' 5'' (1.651 m)       BP Readings from Last 3 Encounters:   09/11/22 129/48   08/22/22 138/65   07/05/22 128/66       Body mass index is 23.1 kg/m?Marland Kitchen    Wt Readings from Last 3 Encounters:   09/11/22 138 lb 12.8 oz (63 kg)   08/22/22 140 lb 9.6 oz (63.8 kg)   07/05/22 139 lb 3.2 oz (63.1 kg)          General Exam: no apparent distress.    Neurologic/Psychiatric: alert and oriented. Affect is appropriate.  HEENT: Sclerae are anicteric. There is no epistaxis.  Cardiac: regular in rate and rhythm.    Respiratory: non-labored breathing. No use of accessory muscles. No wheeze.  Abdomen: bowel sounds are present. Abdomen is soft. No rebound.    Extremities: no edema. No cyanosis or clubbing.    Integument: no petechiae. No acute rash.         Laboratory:  Lab Results   Component Value Date    NA 139 07/05/2022    K 4.5 07/05/2022    CL 102 07/05/2022    CO2 24 07/05/2022    BUN 29 (H) 07/05/2022    CREAT 1.56 (H) 07/05/2022    GLUCOSE 92 07/05/2022    CALCIUM 9.8 07/05/2022     Lab Results   Component Value Date    CKTOT 75 07/05/2022    CKTOT 54 01/07/2022    CKTOT 50 09/01/2021    TROPONIN 0.05 (H) 12/19/2020    TROPONIN 0.07 (H) 12/19/2020 TROPONIN 0.06 (H) 12/18/2020     Lab Results   Component Value Date    BNP 156 (H) 06/03/2021     Lab Results   Component Value Date    WBC 6.26 07/05/2022    HGB 13.6 07/05/2022    HCT 42.1 07/05/2022    MCV 95.2 07/05/2022    PLT 335 07/05/2022     Lab Results   Component Value Date    CHOL 205 07/05/2022    CHOLHDL 60 07/05/2022    CHOLDLQ 135 (H) 07/05/2022    TRIGLY 77 07/05/2022     Lab Results   Component Value Date    PT 12.9 12/18/2020    INR 1.0 12/18/2020     No components found for: ''HA1C''  Lab Results   Component Value Date    TSH 1.6 07/05/2022       Patient Active Problem List   Diagnosis    Acute medial meniscal tear, left, subsequent encounter    Angioedema    Anxiety    Asthmatic bronchitis, mild persistent, with acute exacerbation    Chronic cholecystitis    Chronic interstitial cystitis    CKD (chronic kidney disease), stage III (HCC/RAF)    COPD mixed type (HCC/RAF)    Major depression, recurrent, chronic (HCC/RAF)    Diabetes mellitus (HCC/RAF)    Esophageal reflux    Eustachian tube dysfunction    Fibromyalgia    H/O arthroscopy    Insomnia    Myasthenia gravis with exacerbation (HCC/RAF)    Nocturnal hypoxemia    Osteoarthritis of knee  Renal cyst    Seasonal and perennial allergic rhinitis    Spondylosis without myelopathy or radiculopathy, lumbar region    Urethral caruncle    Vaginal atrophy    Benzodiazepine dependence (HCC/RAF)    Breast cancer (HCC/RAF)    Refractive error    Ptosis of eyelid    Pseudophakia of both eyes    Aortic calcification (HCC/RAF)    HTN (hypertension)    Immunosuppressed status (HCC/RAF)    Acute chest pain    Atypical chest pain    Heart failure with reduced ejection fraction LVEF <=40% (HCC/RAF)    Epiretinal membrane, bilateral    PVD (posterior vitreous detachment), bilateral    Chorioretinal scars, bilateral       Outpatient Medications Prior to Visit   Medication Sig    Albuterol Sulfate AEPB Takes 1 to 2 puffs every 4 hours as needed for wheezing or SOB Inhale Takes 1 to 2 puffs every 4 hours as needed for wheezing or SOB ..    azaTHIOprine 50 mg tablet Take one and one-half tablet (75 mg total) by mouth two (2) times daily.    buPROPion, SR, (WELLBUTRIN SR) 200 mg 12 hr tablet Take 1 tablet (200 mg total) by mouth two (2) times daily.    clonazePAM 1 mg tablet TAKE 1 TABLET BY MOUTH AT NIGHT.    dapagliflozin 10 mg tablet Take 1 tablet (10 mg total) by mouth daily.    DEXILANT 60 MG DR capsule TAKE 1 CAPSULE (60 MG TOTAL) BY MOUTH DAILY    DULOXETINE 20 mg DR capsule TAKE 3 CAPSULES (60 MG TOTAL) BY MOUTH DAILY    Evolocumab (REPATHA SURECLICK) 140 MG/ML SOAJ Inject 1 pen. under the skin every fourteen (14) days.    exemestane 25 mg tablet Take 1 tablet (25 mg total) by mouth daily.    fluticasone-vilanterol (BREO ELLIPTA) 100-25 mcg/inh inhaler Inhale 1 puff daily.    GAMMAKED 5 GM/50ML infusion     GAMUNEX-C 20 GM/200ML infusion     losartan 100 mg tablet Take 1 tablet (100 mg total) by mouth daily.    methenamine hippurate 1 g tablet Take 1 tablet (1 g total) by mouth two (2) times daily.    montelukast 10 mg tablet Take 1 tablet (10 mg total) by mouth as needed for.    PYRIDOSTIGMINE 60 mg tablet TAKE 1 TABLET (60 MG TOTAL) BY MOUTH THREE (3) TIMES DAILY AS NEEDED    trimethoprim 100 mg tablet Take 1 tablet (100 mg total) by mouth daily.     No facility-administered medications prior to visit.       Current Outpatient Medications   Medication Sig    Albuterol Sulfate AEPB Takes 1 to 2 puffs every 4 hours as needed for wheezing or SOB Inhale Takes 1 to 2 puffs every 4 hours as needed for wheezing or SOB ..    azaTHIOprine 50 mg tablet Take one and one-half tablet (75 mg total) by mouth two (2) times daily.    buPROPion, SR, (WELLBUTRIN SR) 200 mg 12 hr tablet Take 1 tablet (200 mg total) by mouth two (2) times daily.    clonazePAM 1 mg tablet TAKE 1 TABLET BY MOUTH AT NIGHT.    dapagliflozin 10 mg tablet Take 1 tablet (10 mg total) by mouth daily.    DEXILANT 60 MG DR capsule TAKE 1 CAPSULE (60 MG TOTAL) BY MOUTH DAILY    DULOXETINE 20 mg DR capsule TAKE 3 CAPSULES (60 MG  TOTAL) BY MOUTH DAILY    Evolocumab (REPATHA SURECLICK) 140 MG/ML SOAJ Inject 1 pen. under the skin every fourteen (14) days.    exemestane 25 mg tablet Take 1 tablet (25 mg total) by mouth daily.    fluticasone-vilanterol (BREO ELLIPTA) 100-25 mcg/inh inhaler Inhale 1 puff daily.    GAMMAKED 5 GM/50ML infusion     GAMUNEX-C 20 GM/200ML infusion     losartan 100 mg tablet Take 1 tablet (100 mg total) by mouth daily.    methenamine hippurate 1 g tablet Take 1 tablet (1 g total) by mouth two (2) times daily.    montelukast 10 mg tablet Take 1 tablet (10 mg total) by mouth as needed for.    PYRIDOSTIGMINE 60 mg tablet TAKE 1 TABLET (60 MG TOTAL) BY MOUTH THREE (3) TIMES DAILY AS NEEDED    trimethoprim 100 mg tablet Take 1 tablet (100 mg total) by mouth daily.     No current facility-administered medications for this visit.             2018 ACC/AHA guidelines recommends high-intensity statin because of ASCVD diagnosis. Patient is intolerant to statin. Consider non-statin Rx or referral.  10-Year ASCVD risk N/A. Age > 9. as of 2:33 PM on 09/11/2022.  10-Year ASCVD risk with optimal risk factors cannot be calculated.  Values used to calculate ASCVD score:  Age: 87 y.o. Cannot calculate risk because age is not between 84 and 87 years old.  Gender: Female Race: White  60 mg/dL. (measured on 07/05/2022)  205 mg/dL. (measured on 07/05/2022)  135 mg/dL. (measured on 07/05/2022)  129 mm Hg. (measured on 09/11/2022)  Yes  currently not a smoker  Yes  Click here for the [facility] ASCVD Cardiovascular Risk Estimator Plus tool (online calculator).      Assessment:     [name] is 87 y.o. female with    Cardiomyopathy, EF 35-40%. Echo in 2023   (G2211 - responsible for the longitudinal management of complex or chronic conditions that require ongoing care and coordination).      - avoiding BBlockers due to myasthenia gravis    Abnormal Myoview in 2023: There is a moderate-severe perfusion defect in the distal anteroseptal wall and apex, suggestive for MI.    SOB/dyspnea. Uses oxygen concentrator  Abnormal PFTs, h/o COPD   Abnormal EKG with LBBB and PACs  Former tobacco use  Subtle calcifications of the aortic root. Moderately calcified thoracic aorta and its main branch vessels.  Intolerant to Statins    Plan:     Losartan 100 mg  ASA    Repatha - discussed compliance   [name], Kaitlyn Blake., [name]  Kaitlyn Croft, [name]  Kaitlyn [name].   She and I had a thoughtful discussion today.  In light of her breast cancer and her Myasthenia Gravis, it seems like medical management of her CAD is best.         Orders Placed This Encounter    ECG [facility]Lead [facility] Performed    GAMMAKED 5 GM/50ML infusion           Return in about 3 months (around 12/12/2022).      GDMT for Heart Failure with Reduced Ejection Fraction   Most recent LVEF: 47.5%, HFrEF present on problem list: Yes.   For the most accurate recommendations, please ensure that a LVEF is present and HFrEF is added to the problem list, if applicable      GDMT Medication Class Current  Prescription Recommendation   Specified Beta Blockers^ Not applicable Patient has a listed allergy to beta blockers   ACEi, ARB, ARNi^^ Losartan Patient is on guideline recommended ACEI or ARB. May consider transition to sacubitril-valsartan if no contraindications exist and most recent LVEF<=40%.   MRA Not applicable May not apply to patient. Most recent LVEF>35%   SGLT2i Dapagliflozin Patient is on guideline recommended SGLT2i   ^ Specified beta blockers: metoprolol succinate, carvedilol, bisoprolol   Marland Kitchen ARNI (sacubitril/valsartan) preferred. Criteria for ARNI initiation: Discontinuation of ACEI for at least 36 hours, eGFR?30, K?5.2, no history of angioedema   Click here for ACC / AHA HFrEF GDMT recommendations         There are no Patient Instructions on file for this visit.    Future Appointments Date Time Provider Department Center   09/25/2022  1:40 PM York Ram., [name] [facility] [address] [place]/Cen   10/17/2022  2:30 PM [facility] [id number] (HOSP) [facility] [place]/[facility]en   10/24/2022  1:30 PM Dellie Burns, [name] [facility] [place]/Cen   07/02/2023 11:30 AM Luis Abed., OD DS VIS RE[facility] [place]/Cen         Author: Clelia Croft, [name]

## 2022-09-23 DIAGNOSIS — Z0000 Encounter for general adult medical examination without abnormal findings: Secondary | ICD-10-CM

## 2022-09-24 NOTE — Progress Notes
OUTPATIENT GERIATRICS CLINIC NOTE    PATIENT:  [name]   MRN:  [id number]  DOB:  10/18/1935  DATE OF SERVICE:  09/25/2022  PRIMARY CARE PHYSICIAN: York Ram., MD    CHIEF COMPLAINT:   No chief complaint on file.      [place] Special[facility]:  [employer]    [facility] Specialists:    Chaperone status:  No data recorded    HISTORY OF PRESENT ILLNESS     [name] is a 87 y.o. female who presents today for *follow up**. Patient is accompanied by *no one**. History today is per the patient,and review of available recent records in [facility] and Care Everywhere.     Patient is a(n) [x]  reliable []  unreliable historian and additional collateral information was obtained during the visit today from:         Back on IVIG            Per chart review, pertinent medical history:  Past Medical History:   Diagnosis Date    Breast cancer (HCC/RAF) 12/20/2018     No past surgical history on file.    ALLERGIES     Allergies   Allergen Reactions    Beta Adrenergic Blockers Other (See Comments)     Avoid d/t Myasthenia Gravis  Avoid d/t Myasthenia Gravis      Levofloxacin Other (See Comments)     Flare of Myasthenia Gravis    Macrolides And Ketolides Other (See Comments)     Avoid d/t Myasthenia Gravis  Use with caution d/t Myasthenia Gravis  Use with caution d/t Myasthenia Gravis      Sulfa Antibiotics Other (See Comments)     May possibly have caused deafness in one ear  May possibly have caused deafness in one ear  other      Botulinum Toxins Other (See Comments)     Muscle weakness r/t Myasthenia Gravis  Muscle weakness r/t Myasthenia Gravis      Statins Other (See Comments)     Muscle aches  Muscle aches  Muscle aches      Plastic Tape Itching and Other (See Comments)     Turns the skin red where applied        MEDICATIONS     Personally reviewed.    No outpatient medications have been marked as taking for the 09/25/22 encounter (Appointment) with York Ram., MD.       SOCIAL HISTORY     Social History Social History Narrative    Not on file        FUNCTIONAL STATUS     BADLs:  IADLs:    Ambulates with   Falls in past year:  Afraid of falling:    GERIATRIC REVIEW OF SYSTEMS     Vision:    []   glasses     []  legally blind  Hearing: []  hearing aids    Nutrition: []  normal   []  impaired  []  vegan  []  vegetarian  []  low salt  []  low-carb   Swallowing: []  impaired   Dentures:   []  yes    Depression:  []  yes   Cognition:     Incontinence: [] urine  []  fecal  []  urine and fecal      ADVANCED CARE PLANNING     Advanced directives on file: []  No  []   Yes ? Completed:     Medical DPOA on file:   []   Yes ?   []   No ? Patient  designates ** * to be their surrogate medical decision-maker.         HEALTH CARE MAINTENANCE     IMMUNIZATIONS:   Immunization History   Administered Date(s) Administered    COVID-19 [employer] Fall 2023 12y and up) PF, 30 mcg/0.3 mL 03/27/2022    COVID-19, mRNA, (Pfizer - Purple Cap) 30 mcg/0.3 mL 03/11/2019, 04/01/2019, 10/29/2019    COVID-19, mRNA, bivalent (Pfizer) 30 mcg/0.3 mL (12y and up) 11/25/2020, 07/05/2021    COVID-19, mRNA, tris-sucrose (Pfizer - [employer]) 30 mcg/0.3 mL 06/20/2020    DTaP 12/31/2015    influenza vaccine IM quadrivalent (Fluzone Quad) MDV (15 months of age and older) 11/26/2016    influenza vaccine IM quadrivalent adjuvanted (FluAD Quad) (PF) SYR (15 years of age and older) 11/05/2018, 12/01/2019, 11/25/2020    influenza vaccine IM quadrivalent high dose (Fluzone High Dose Quad) (PF) SYR (44 years of age and older) 11/22/2021    influenza, unspecified formulation 05/10/2014, 12/29/2015, 11/26/2016, 04/01/2019, 04/01/2019, 12/01/2019, 12/01/2019, 12/01/2019, 12/01/2019, 11/25/2020, 11/25/2020, 01/03/2022    pneumococcal conjugate vaccine 20-valent (Prevnar 20) 07/05/2021    pneumococcal polysaccharide vaccine 23-valent (Pneumovax) 01/10/2019       Mammogram:  DXA:  PAP:  Colonoscopy:      PHYSICAL EXAM     BP 138/76  ~ Pulse 94  ~ Temp 36.5 ?C (97.7 ?F) (Tympanic)  ~ Resp 20 ~ Wt 139 lb 11.2 oz (63.4 kg)  ~ SpO2 95%  ~ BMI 23.25 kg/m?   Wt Readings from Last 3 Encounters:   09/11/22 138 lb 12.8 oz (63 kg)   08/22/22 140 lb 9.6 oz (63.8 kg)   07/05/22 139 lb 3.2 oz (63.1 kg)         System Check if normal Positive or additional negative findings   GEN  [x]  NAD     Eyes  []  Conj/Lids []  Pupils  []  Fundi   []  Sclerae []  EOM     ENT  []  External ears   []  Otoscopy   []  Gross Hearing    []   External nose   []  Nasal mucosa   []  Lips/teeth/gums    []  Oropharynx    []  Mucus membranes      Neck  []  Inspection/palpation    []  Thyroid     Resp  []  Effort    []  Auscultation       CV  []  Rhythm/rate   []  Murmurs   []  Edema   []  JVP non-elevated    Normal pulses:   []  Radial []  Femoral  []  Pedal     Breast  []  Inspection []  Palpation     GI  []  Bowel sounds    []  Nontender   []  No distension    []  No rebound or guarding   []  No masses   []  Liver/spleen    []  Rectal     GU  F:  []  External []  vaginal wall         []  Cervix     []  mucus        []  Uterus    []  Adnexa   M:  []  Scrotum []  Penis         []  Prostate     Lymph  []  Cervical []  supraclavicular     []  Axillae   []  Groin/inguinal     MSK []  Gait  []  Back     Specify site examined:    []  Inspect/palp []  ROM   []  Stability []   Strength/tone []  Used arms to push up from seated to standing position   Assistive device:  []  single point cane []  quad cane  []  FWW []  rollator walker      Skin  []  Inspection []  Palpation     Neuro  []  Alert and oriented     []  CN2-12 intact grossly   []  DTR      []  Muscle strength      []  Sensation   []  Pronator drift   []  Finger to Nose/Heel to Shin   []  Romberg     Psych  []  Insight/judgement     []  Mood/affect    []  Gross cognition            LABS/STUDIES     LABS:  Lab Results   Component Value Date    WBC 6.26 07/05/2022    WBC 5.89 03/27/2022    WBC 5.37 01/03/2022    HGB 13.6 07/05/2022    HGB 12.8 03/27/2022    HGB 12.8 01/03/2022    MCV 95.2 07/05/2022    PLT 335 07/05/2022    PLT 358 03/27/2022    PLT 339 01/03/2022     Lab Results   Component Value Date    NA 139 07/05/2022    NA 138 05/05/2022    NA 139 05/03/2022    K 4.5 07/05/2022    K 4.6 05/05/2022    K 4.3 05/03/2022    CREAT 1.56 (H) 07/05/2022    CREAT 1.67 (H) 05/05/2022    CREAT 1.89 (H) 05/03/2022    GFRESTNOAA 29 06/09/2020    GFRESTNOAA 28 05/18/2020    GFRESTNOAA 30 03/23/2020    GFRESTAA 34 06/09/2020    GFRESTAA 33 05/18/2020    GFRESTAA 35 03/23/2020    CALCIUM 9.8 07/05/2022    CALCIUM 9.5 05/05/2022    CALCIUM 9.6 05/03/2022     Lab Results   Component Value Date    ALT 11 07/05/2022    ALT 14 05/03/2022    AST 23 07/05/2022    AST 21 05/03/2022    ALKPHOS 94 07/05/2022    BILITOT <0.2 07/05/2022    ALBUMIN 3.8 (L) 07/05/2022    ALBUMIN 4.0 05/03/2022     Lab Results   Component Value Date    TSH 1.6 07/05/2022    TSH 2.3 01/03/2022    TSH 2.7 09/01/2021     Lab Results   Component Value Date    HGBA1C 5.9 (H) 07/05/2022    HGBA1C 5.3 05/03/2022    HGBA1C 5.2 09/01/2021     Lab Results   Component Value Date    CHOL 205 07/05/2022    CHOL 226 01/07/2022    CHOL 237 09/01/2021     Lab Results   Component Value Date    CHOLDLQ 135 (H) 07/05/2022    CHOLDLQ 144 (H) 01/07/2022    CHOLDLQ 147 (H) 09/01/2021     Lab Results   Component Value Date    VITD25OH 76 10/18/2021     Lab Results   Component Value Date    FE 87 07/14/2019    FERRITIN 57 07/14/2019    FOLATE 9.6 11/25/2019    TIBC 356 07/14/2019     Lab Results   Component Value Date    VITAMINB12 3,994 (H) 11/25/2019    VITAMINB12 217 (L) 09/22/2019     Lab Results   Component Value Date    BNP 156 (H) 06/03/2021  BNP 92 09/22/2019     No results found for: ''PSATOTAL''      STUDIES:      XR CHEST PA LAT 2V  September 22, 2019   COMPARISON: KUB December 17, 2018     History: sob     FINDINGS:     Lungs: Clear  Heart/aorta: Normal heart size. The thoracic aorta is mildly calcified, tortuous and ectatic.  Adenopathy: None  Pleura: No effusion  Bones and Chest wall: No acute bony or body wall  findings. Osteopenia and bony maturational changes. Right upper quadrant cholecystectomy clips stable from October 2020        IMPRESSION:     No acute findings or abnormalities related to provided history.      MRI C spine  May 03, 2021  IMPRESSION:  Redemonstrated congenital cervical spinal canal stenosis with superimposed multilevel degenerative changes, which are mildly progressive compared to prior as above. No evidence of cord compression or cord signal abnormality.      Echo  Jan 2023  1. Normal left ventricular size.  2. There are LV regional wall motion abnormalities.  3. Left ventricular ejection fraction is approximately 35 to 40%.  4. Abnormal LV diastolic function.  5. There are no prior studies on this patient for comparison purposes.  ASSESSMENT and PLAN     Madisen Zdrojewski is a 87 y.o. female who presents today for *follow up**.    #Major Depression--active--self d/ced duloxetine.  Rechallenge.  Tolerating Duloxetine.  #Myesthenia Gravis--worse.  Has not had Gamunex in 4 months. Will work with [facility] to facilitate restarting IVIG.  At present, patient is concerned that she may be financially responsible for infusions.  Will explore getting infusion at [facility] infusion center if that is more cost effective.  #HFrEF--EF 35-40%noted on echo-- 2023--Not Worsening.  Chronic.  Will monitor and treat underlying risk factors with medical and lifestyle interventions as warranted by balance between benefits and burdens.   #CAD--discussed medical management alone with shared decision making model May 11, 2021.  Declines staint  #Cervical myelopathy--monitored by neuro--chronic--re-imaged on MRI as above.  CTM.  #HTN--Increased nifedipine CR 60 mg daily. Continue losartan 50 mg.  BP Better.    #CKD--stage 4--chronic--CTM--control BP as tolerated by symptoms of orthostasis and fatigue.  Trial off trimethoprim.  REcheck Cr now and in two weeks.  #CAD--patient leaning towards medical management.  #Aortic Calcification--noted on chest imaging September 22, 2019--Not Worsening.  Chronic.  Will monitor and treat underlying risk factors with medical and lifestyle interventions as warranted by balance between benefits and burdens.  #allergic rhinitis--trial flonase  #Macrocytosis--  #Deconditioning--initiate home PT  #GERD--s/p Nisan fundoplication  #COPD--mixed--per PFT's from [name].--albuterol prn.  May resume other inhalders.  #s/p rectocele repair  #s/p cholecystectomy  #Immunosupressed status--medication related.  Chronic. CTM  #Nocturnal hypoxemia--presumably related to MG crisis but has history of COPD.  Will order nocturnal home O2 study and consider PFT's/Pulmonary evaluation once settled down.  May need to restart BREO inhaler.  #Myasthenia Gravis--s/p plasmapheresis x5 October 2020, no Thymoma, on Azathioprine 150 daily and mestinon 60 tid.  Saw Dr. Enedina Finner at [facility].  Was considering Soliris.  Ordering IVIG and MRI.  Encourage ongoing neurology follow up.   #IBS--titrate miralax to comfort.    #Interstitial cystitis--stopping daily Trimethoprim 100 for UTI prophylaxis.  Using bladder instillations prn. OFF topical estrogen due to advice from neurologist re: MG.  To see Dr. Lorenz Coaster.  Suspect recent flare related to breast CA re-diagnosis.  Doubt pseudomonas bactiuria is pathogen at present.  WIll complete current meropenum.  #Allergic rhinitis--previously on nasal steroids.  #Breast CA--under care.  Patient continues to decline surgery.  Wondering about additional treatment options.  Will direct to med-onc for discussion.  #DM with CKD--stabe 3b--based on labs from [name].  FOllow here.  If BP permits, may consider ARB BUT patient with h/o angioedema so will ONLY initiate this agent with great caution.  Refer to Ophthalmology.  #OA knees--previously getting hyaluronic acid injections.  #Anxiety  #Benzodiazepine Dependence--uncomplicated--chronic.  Encourage patient to decrease intake--ideally to abstaining but will work towards this goal over time.  Not likely to achieve this given ongoing concerns re: breast ca.  #Fibromyalgia--duloxetine as above.   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  #Statin intolerance      FOLLOW-UP     RTC     Future Appointments   Date Time Provider Department Center   09/25/2022  1:40 PM York Ram., MD GERI IMS [address] [place]/Cen   10/17/2022  2:30 PM MP2 MAM PANEL01 (HOSP) MAM MP2 [place]/Cen   10/24/2022  1:30 PM Dellie Burns, MD NEUMUSC [address] [place]/Cen   12/12/2022  2:00 PM Clelia Croft, MD CRD DIS 2020 [facility]   07/02/2023 11:30 AM Luis Abed., OD DS VIS [facility] [place]/Cen         The above plan of care, diagnosis, orders, and follow-up were discussed with the patient and/or surrogate. Questions related to this recommended plan of care were answered.    ANALYSIS OF DATA (Needs to meet 1 category for moderate and 2 categories for high LOS)     I have:     Category 1 (Needs 3 for moderate and high LOS)     []  Reviewed []  1 []  2 []  ? 3 unique laboratory, radiology, and/or diagnostic tests noted below    Test/Study:  on date .    []  Reviewed []  1 []  2 []  ? 3 prior external notes and incorporated into patient assessment    I reviewed Dr. 's note in specialty  from date .    []  Discussed management or test interpretation with external provider(s) as noted      []  Ordered []  1 []  2 []  ? 3 unique laboratory, radiology, and/or diagnostic tests noted in A&P    []  Obtained history from independent historian:       Category 2  []  Independently interpreted the test    Category 3  []  Discussed management or test interpretation with external provider(s) as noted      INTERACTION COMPLEXITY and SOCIAL DETERMINANTS of HEALTH       PROBLEM COMPLEXITY   []  New problem with uncertain diagnosis or prognosis (moderate)   []  Multiple stable chronic problems (moderate)   []  Chronic problem not stable - not controlled, symptomatic, or worsening (moderate)   []  Severe exacerbation of chronic problem (high)   []  New or chronic problem that poses threat to life or bodily function (high)     MANAGEMENT COMPLEXITY   []  Old or external/outside records reviewed   []  Discussion with alternate (proxy) if patient with impaired communication / comprehension ability (e.g., dementia, aphasia, severe hearing loss).   []  Repeated questions (or disagreement) between patient and/among caregivers/family during the visit.  []  Caregiver/patient emotions/behavior/beliefs interfering with implementation of treatment plan.  []  Independent interpretation of test (EKG, Chest XRay)   []  Discussion of case with a consultant physician     RISK LEVEL   []   Prescription drug management (moderate)   []  Minor surgery with CV risk factors or elective major surgery (moderate)   []  Dx or Rx significantly limited by SDoH (inadequate housing, living alone, poor health care access, inappropriate diet; low literacy) (moderate)   []  Major surgery - elective with CV risk factors or emergent (high)   []  Need for hospitalization (high)   []  New DNR or de-escalation of care (high)      SDoH  The diagnosis or treatment of said conditions is significantly limited by the following social determinants of health:  []  Z59.0 Homelessness  []  Z59.1 Inadequate housing  []  Z59.2 Discord with neighbors, lodgers and landlord  []  Z60.2 Problems related to living alone  []  Z59.8 Other problems related to housing and economic circumstances  []  Z59.4 Lack of adequate food and safe drinking water  []  Z59.6 Low income  []  Z59.7 Insufficient social insurance and welfare support  []  Z59.9 Problems related to housing and economic circumstances, unspecified  []  Z75.3 Unavailability and inaccessibility of health care facilities  []  Z75.4 Unavailability and inaccessibility of other helping agencies  []  Z72.4 Inappropriate diet and eating habits  []  Z62.820 Parent-biological child conflict  []  Z63.8 Other specified problems related to primary support group  []  Z55.0 Illiteracy and low level literacy   []  Z56.9 Unspecified problems related to employment        If Billing Based on Time:     I performed the following items on the day of service:    [x]  Preparing to see the patient (e.g., review of tests)  [x]  Obtaining and/or reviewing separately obtained history   [x]  Performing a medically appropriate examination and/or evaluation   [x]  Counseling and educating the patient/family/caregiver   [x]  Ordering medications, tests, or procedures  [x]  Referring and communicating with other healthcare professionals (when not separately reported)  [x]  Documenting clinical information in the EHR  []  Independently interpreting results and communicating results to patient/family/caregiver    I spent the following total amount of time on these tasks on the day of service:  New Patient     Established Patient  []  15-29 minutes - 99202    []  up to 9 minutes - 99211  []  30-44 minutes - 99203     []  10-19 minutes - 99212   []  45-59 minutes - 99204      []  20-29 minutes - 99213   []  60-74 minutes - 99205   [x]  30- minutes - 99214         []  40-55 minutes - 99215    []  I spent an additional ** * 15-minute-increment(s) for a total of * ** minutes on these tasks on the day of service. [phone] for each additional 15 minutes.)    Author: York Ram, MD 09/25/2022    Annual Wellness Visit:     []  Initial Medicare Annual Wellness Visit   [x]  Subsequent Medicare Annual Wellness Visit - at least 12 months since last AWV     [x]  I reviewed patient's past medical history, family history and medications. I updated the electronic record.    [x]  I reviewed with patient what other consulting doctors are involved in the patient's care. See above for list.    Patient's Self Reported Health Status: []  poor []  fair [x]  good []  excellent  []  unreliable per patient given cognitive impairment  Hospitalizations in the past year:  []  none  []  More than 3  [x]   Less than 3  More than  5 chronic medical conditions: [x]  yes []  no   Polypharmacy (greater than 6 chronic medications):  [x]  yes []  no    Opioid assessment:  - Taking opioids: []  yes [x]  no  - If yes, address the following:  - Current pain severity:   - Evaluate current treatment plan and changes needed: []  No   []  Yes ->   - Discussed non-opioid treatment options  - Referred to specialist: []  yes []  no      Functional and Physical Assessment Cognitive & Social Assessments   Visual Acuity:    [x]  Regularly sees ophthalmologist    []  Recommend eye exam    Hearing Screen (whisper test):    []  Denies hearing complaints   []  Able to hear whisper or finger-rub at 6 feet.   []  Audiology referral placed   [x]  Hearing aides present    Weight Loss: []  yes [x]  no    Get Up and Go Test: >12    Ambulatory Device:    [x]  none []  cane []   walker []   WC    Fall in past year: []  none    [x]  yes  Afraid of Falling: [x]  yes       []  no    Activities of Daily Living:   []  Independent in all ADLs and IADLs   [x]  Dependent in the following              []   Bathing    [x]  Groceries            []   Dressings [x]  Driving/bus            []   Toileting []  Telephone            []   Continence []  Chores            []   Grooming []  Cooking            []   Feeding []  Laundry            []  Transferring []  Medications    [x]  Finances    Mood: PHQ-9 2  Cognition: mini-cog 5/5    Living situation:      [x]  home     []  Assisted Living/board & care        []  lives alone         []  lives with family        []  caregiver not needed      [x]  caregiver support is sufficient       []  has employed caregiver(s)        []  family provides caregiving        []  Provides care for another person      [x]  retired    []  still working    [x]  wears a seatbelt    [x]  no current tobacco use  []  tobacco cessation counseling provided  [x]  no current alcohol use    []  acceptable amount of alcohol use. Counseled on moderation  []  alcohol cessation counseling provided  [x]  no current drug/substance use  []  substance cessation counseling provided    []  exercises & discussed recommendations  [x]  unable to exercise given functional limitations    [x]  Discussed home safety: rugs, bathroom safety (grab-bars, non-slip pads), handrails for stairs, clutter, slippery surfaces, and safe lighting    [x]  Diet reviewed and recommendations provided on healthy eating    []  I reviewed patient's social history (see above and note for additional details)  Advanced directive:    []  Yes   []  No     []  On file   []  Reviewed     []  Discussed - see below   []  Materials given        # Healthcare Maintenance   []  Refer for mammogram   []  Refer for DXA   []  PAP smear today   []  Order FIT test   []  Refer for colonoscopy   []  Screen for hepatitis C    []  Screen for hepatitis B    []  Check PSA    []  Refer for AAA screening   [x]  Counseled on flu vaccine and COVID booster vaccine   []  Vaccinations ordered today:     []  Influenza  []  PCCV-20 []  pneumovax-23  (only if had PCCV-13 in past year)           []  shingrix    []  COVID       []  hepatitis B   []  Tdap/Td        Advanced Care Planning/goals of care discussion. I reviewed with Onella Andress/patient's surrogates today regarding his/her goals of care and advanced care planning on a voluntary basis for the patient/patient's surrogates.      Time spent: []  Initial 30 minutes - at least 16 minutes []  longer than initial 30 minutes    Topics discussed today:   []  Medical conditions  []  Prognosis  []  Goals of care  []  Designation of surrogate medical decision-maker  []  Completion of advanced directives  []  Completion of POLST.     Part A:  []  Attempt resuscitation/CPR  []   Do not attempt resuscitation   Part B:  []  Full treatment  []  Selective treatment  []  Comfort-focused treatment   Part C: []  Long-term artificial nutrition  []  Trial of artificial nutrition []  No artificial nutrition    Please see Goals of Care note today for additional details.         Advice/Referrals:   Subsequent annual wellness visit--at least 12 months since last annual wellness visit.       The additional diagnoses assessed at this visit listed below constituted a separate, identifiable service from the routine preventive health examination.       Acupuncture

## 2022-09-25 ENCOUNTER — Ambulatory Visit: Payer: BLUE CROSS/BLUE SHIELD | Attending: Geriatric Medicine

## 2022-10-04 ENCOUNTER — Ambulatory Visit: Payer: BLUE CROSS/BLUE SHIELD

## 2022-10-05 ENCOUNTER — Telehealth: Payer: BLUE CROSS/BLUE SHIELD

## 2022-10-05 DIAGNOSIS — R051 Acute cough: Secondary | ICD-10-CM

## 2022-10-05 MED ORDER — AMOXICILLIN-POT CLAVULANATE 500-125 MG PO TABS
1 | ORAL_TABLET | Freq: Two times a day (BID) | ORAL | 0 refills | Status: AC
Start: 2022-10-05 — End: ?

## 2022-10-05 MED ORDER — PREDNISONE 20 MG PO TABS
ORAL_TABLET | 0 refills | Status: AC
Start: 2022-10-05 — End: ?

## 2022-10-05 NOTE — Telephone Encounter
Outpatient Geriatric Telephone Visit     Patient: [name]  MRN: [id number]  PCP: York Ram., MD  Date: 10/05/2022      Patient's concerns:    Cough  No sob  OCIVD pending    Assessment and Plan:  cough  Problems:  cough    Plan:    Augmentin 500 bid for 7 days  Prednisone 20 mg daily for 5 days.  ER precautions.  Await COVID test      # advice given about precautions for coronavirus    Future Appointments   Date Time Provider Department Center   10/17/2022  2:30 PM MP2 MAM PANEL01 (HOSP) MAM MP2 [place]/Cen   10/24/2022  1:30 PM Dellie Burns, MD NEUMUSC [address] [employer]/Cen   12/11/2022  1:40 PM York Ram., MD GERI IMS [address] [place]/Cen   12/12/2022  2:00 PM Clelia Croft, MD CRD DIS 2020 [facility]   07/02/2023 11:30 AM Luis Abed., OD DS VIS [facility] /Cen         Total time spent on this telephone encounter:  []   5-10 minutes (99441)  []   11-20 minutes (45409)  [x]   >21 minutes (81191)      [All the following must be checked off in order to bill for this service.]    [x]   This service is not related to a problem addressed by an E/M encounter in the prior 7 days  [x]   This service will not result in an office or telemedicine visit within the next 24 hours or soonest available appointment.   [x]   This service was initiated by the patient.   [x]   This patient is an established patient in my practice.     The patient has been notified that this visit will result in an E/M charge.    Signed: York Ram, MD

## 2022-10-11 ENCOUNTER — Ambulatory Visit: Payer: BLUE CROSS/BLUE SHIELD

## 2022-10-24 ENCOUNTER — Ambulatory Visit: Payer: BLUE CROSS/BLUE SHIELD | Attending: Student in an Organized Health Care Education/Training Program

## 2022-10-24 DIAGNOSIS — G7000 Myasthenia gravis without (acute) exacerbation: Secondary | ICD-10-CM

## 2022-10-24 DIAGNOSIS — G250 Essential tremor: Secondary | ICD-10-CM

## 2022-10-24 NOTE — Progress Notes
NEUROMUSCULAR CLINIC NOTE    PATIENT:  [name]  MRN:  [id number]  DOB:  September 15, 1935  DATE OF SERVICE:  10/24/2022    REFERRING PHYSICIAN: No ref. provider found  PRIMARY CARE PHYSICIAN: York Ram., MD  REASON FOR VISIT: Myasthenia gravis    History of Present Illness:     [name] is a 87 y.o. 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 She will see me in person on 6/26 but we arranged a sooner telephone visit since she is worried about worsening symptoms. She states that she did not receive IVIG since January and has been feeling gradual worsening of myasthenia symptoms. She has been stumbling and feels her balance is off. She noted slight worsening of droopy eyelids. She is also experiencing blurry vision and double vision. She has no swallowing difficulty and usually does not have shortness of breath, except for a single recent episode that resolved with COPD medications.     She was diagnosed 8 years ago. At the time she was having significant double vision. She initially had ocular symptoms only, then over time developed generalized myasthenia symptoms. She had one acute exacerbation that required hospitalization and plasma exchange treatment. She was previously treated with combination of IVIG, azathioprine, and Mestinon. IVIG dosing was every 2 weeks for a long time but was adjusted to every 3 weeks in 2023. She has been off of IVIG since the beginning of 2024 for the past 6 months now. She remains on azathioprine. She is not on pyridostigmine as far as she can tell.    Interval history 08/22/2022:  Previous visit was a telephone visit for me to review if restarting IVIG is appropriate. The order was approved and she is due for the initial loading dose of IVIG later this week. She continues to take azathioprine 75 mg BID. She also has a prescription for Mestinon 60 mg as needed up to 3 times a day. She is not sure if she is taking it because the medications are managed by the caregiver. She is either taking it on a regular schedule or not taking at all.    Currently she has no double vision, although this was a problem in the past. She feels her vision is a little blurry. She notices right sided ptosis when she is tired. Sometimes her daughter notices that she is slurring her words but she does not notice a difference herself. She is used to cutting solid food into very small pieces or eating soft foods. She feels chewing has been harder since she stopped getting IVIG. She has mild difficulty swallowing, which to her feels like her esophagus is smaller than it should be. She does not choke. Her chief complaint is feeling weak and tired. She is frequently feeling very tired. She is unsure how much of this is due to COPD. She gets shortness of breath easily with exertion. She has difficulty using her arms when she is tired. She feels legs are constantly a little weak. She always has to use her arms to get up from sitting position. She has been using a walker for the past 2-3 months. She denies any falls. She also complains of significant hand tremor that has been gradually getting worse. She notices this with fine tasks such as stitching. The tremor does not interfere with daily activities such as eating.    Interval history  10/24/2022:  She had an episode of bronchitis and was treated with prednisone. During this she was told she should not get IVIG and she missed the last dose of IVIG (It has been 6 weeks). She is scheduled to get it this week. She still feels tired all the time. A new complaint is double vision that she has noticed on a few occasions. This does not bother her at home but bothers her a lot when outside. She feels her legs are weaker. She feels this is because of she missed the dose of IVIG. She is taking Mestinon 60 mg 3 times a day on schedule. She complains about the hand tremor today as well. This only bothers her when she is doing something intricate and she still does not want to take another medication.    Myasthenia Gravis Activities of Daily Living (MG-ADL)  None = 0  Mild = 1  Moderate = 2  Severe = 3     Talking:  None = 0: Normal  Mild = 1  Moderate = 2: Constant slurring or nasal speech, but can be understood  Severe = 3: Difficult to understand speech     Chewing:  None = 0: Normal   Mild = 1: Fatigue with solid food  Moderate = 2: Fatigue with soft food  Severe = 3: Gastric tube     Swallowing:  None = 0: Normal  Mild = 1: Rare episode of choking  Moderate = 2: Frequent choking necessitating changes in diet  Severe = 3: Gastric tube     Breathing:  None = 0: Normal neeed  Mild = 1: Shortness of breath with exertion  Moderate = 2: Shortness of breath at rest  Severe = 3: Ventilator dependance     Impairment of ability to brush teeth or comb:  None = 0: None  Mild = 1: Extra effort, but no rest periods needed  Moderate = 2: Rest periods needed  Severe = 3: Cannot do one of these functions     Impairment of ability to arise from a chair:  None = 0: None  Mild = 1: Mild, sometimes uses arms  1  Moderate = 2: Moderate, always uses arms  Severe = 3: Severe, requires assistance     Double vision:  None = 0: None  Mild = 1: Occurs, but not daily  Moderate = 2: Daily, but not constant  Severe = 3: Constant     Eyelid droop  None = 0: None  Mild = 1: Occurs, but not daily  Moderate = 2: Daily, but not constant  Severe = 3: Constant     Total: 8 on 10/24/2022 (previously 8 on 08/22/2022 and 7 on 08/15/2021)    Past Medical History:  Past Medical History:   Diagnosis Date    Breast cancer (HCC/RAF) 12/20/2018      Past Surgical History:  No past surgical history on file.    Social History:  Social History     Socioeconomic History    Marital status: Widowed   Tobacco Use    Smoking status: Former     Current packs/day: 0.00     Types: Cigarettes Quit date: 1985     Years since quitting: 39.6    Smokeless tobacco: Former   Substance and Sexual Activity    Alcohol use: Not Currently    Drug use: Never     Social Determinants of Psychologist, prison and probation services Strain: Low Risk  (  04/12/2022)    Financial Resource Strain     Difficulty of Paying Living Expenses: Not hard at all     Family History:  No family history on file.    Allergies:  Allergies   Allergen Reactions    Beta Adrenergic Blockers Other (See Comments)     Avoid d/t Myasthenia Gravis  Avoid d/t Myasthenia Gravis      Levofloxacin Other (See Comments)     Flare of Myasthenia Gravis    Macrolides And Ketolides Other (See Comments)     Avoid d/t Myasthenia Gravis  Use with caution d/t Myasthenia Gravis  Use with caution d/t Myasthenia Gravis      Sulfa Antibiotics Other (See Comments)     May possibly have caused deafness in one ear  May possibly have caused deafness in one ear  other      Botulinum Toxins Other (See Comments)     Muscle weakness r/t Myasthenia Gravis  Muscle weakness r/t Myasthenia Gravis      Pollen Extract Wheezing    Statins Other (See Comments)     Muscle aches  Muscle aches  Muscle aches      Plastic Tape Itching and Other (See Comments)     Turns the skin red where applied     Current Medications:  Current Outpatient Medications   Medication Instructions    Albuterol Sulfate AEPB Takes 1 to 2 puffs every 4 hours as needed for wheezing or SOB Inhale Takes 1 to 2 puffs every 4 hours as needed for wheezing or SOB .    amoxicillin-clavulanate 500-125 mg tablet 1 tablet, Oral, 2 times daily    azaTHIOprine 50 mg tablet Take one and one-half tablet (75 mg total) by mouth two (2) times daily.    buPROPion (SR) (WELLBUTRIN SR) 200 mg, Oral, 2 times daily    clonazePAM 1 mg tablet TAKE 1 TABLET BY MOUTH AT NIGHT    dapagliflozin (FARXIGA) 10 mg, Oral, Daily    DEXILANT 60 mg, Oral, Daily    DULoxetine (CYMBALTA) 60 mg, Oral, Daily    EPINEPHrine 0.3 mg/0.3 mL auto-injector Evolocumab (REPATHA SURECLICK) 140 MG/ML SOAJ 1 pen., Subcutaneous, Every 14 days    exemestane (AROMASIN) 25 mg, Oral, Daily    fluticasone-vilanterol (BREO ELLIPTA) 100-25 mcg/inh inhaler 1 puff, Inhalation, Daily    GAMMAKED 5 GM/50ML infusion     losartan (COZAAR) 100 mg, Oral, Daily    methenamine hippurate (HIPREX) 1 g, Oral, 2 times daily    montelukast (SINGULAIR) 10 mg, Oral, As needed for    predniSONE 20 mg tablet One daily for 5 days    PYRIDOSTIGMINE 60 mg tablet TAKE 1 TABLET (60 MG TOTAL) BY MOUTH THREE (3) TIMES DAILY AS NEEDED    trimethoprim (TRIMPEX) 100 mg, Oral, Daily     Review of Systems:  Complete review of systems otherwise negative, except as noted in HPI.    Physical Examination:     Physical Examination:   Vitals signs: BP 127/68  ~ Pulse 80  ~ Temp 36.7 ?C (98.1 ?F) (Forehead)  ~ Ht 5' 5'' (1.651 m)  ~ Wt 137 lb (62.1 kg)  ~ SpO2 93%  ~ BMI 22.80 kg/m?      General: Well appearing, in no acute distress.   HEENT: Normocephalic. No dysmorphic features. Mucous membranes are moist.   Neck: Supple  Respiratory: Tachypnea and wheezing with any physical activity.   Abdomen: Soft, non-tender  Extremities: Warm and well perfused. No edema.  Skin: No rashes appreciated.    NEUROLOGIC EXAMINATION:  Mental Status: The patient is alert, oriented, and attentive, with normal concentration, memory, and speech. Fund of knowledge is appropriate.    Cranial Nerves: 1 mm right ptosis at baseline, mildly worse with 1 minute upgaze. Extraocular movements are full and conjugate. Reports mild horizontal diplopia after prolonged lateral gaze in each direction, although this is likely blurry vision as it persist when she closes either eye. Facial sensation even and symmetric to light touch. Facial movements are symmetric and have normal strength with eye closure, smile and cheek puff bilaterally. Hearing significantly reduced on both sides, to the point it interferes with regular conversation. No hoarseness, no clear dysarthria. Palate elevates symmetrically and uvula is at midline. Tongue protrudes at midline, moves laterally well and is full strength. No tongue fasciculations.    Motor: Normal muscle bulk and tone. There were no adventitious movements or fasciculations appreciated.   Cannot get up without using arms    Strength (MRC Scale)  Neck extensors 4+ **       Neck flexors 4 **        Right Left  Right Left   Shoulder Int Rotation * * Hip Flexion 4 4   Shoulder Ext Rotation * * Hip Extension * *   Deltoid 4+ **  5 ** Hip Adduction 5 5   Biceps 5 5 Hip Abduction 4+ 4+   Triceps 5 5 Quadriceps 5 4+   Pronation * * Hamstrings 5 4+   Wrist Extension 5 5 Dorsiflexion 5 5   Wrist Flexion 5 5 Plantar Flexion 5 5   Finger Extension 5 5 Eversion * *   Interossei 5 5 Inversion * *   Finger Flexion (FDP) 5 5 EHL * *   Thumb Abduction 5 5 Toe Flexion * *   Thumb Flexion * * Toe Extension * *   Thumb Extension * *      * Not tested  ** Pain limited    Sensory: Pinprick normal.  Vibration minimally impaired on toes.  Romberg positive.    Coordination: Bilateral hand action tremor. Also present at jaw and lower extremities. No ataxia or dysmetria.    Reflexes:   DTR Right Left   Biceps 2+ 2+   Triceps 2+ 2+   Brachioradialis 2+ 2+   Knees 2+ 2+   Ankles 2+ 2+     Negative Babinski and Hoffman's sign bilaterally. Positive cross abductors    Gait:  Normal stance width and stride length. Normal heel, toe, and tandem walking.     Diagnostic Studies:     Labs:   08/26/2015: AChR binding (0.63) blocking (28) antibody, ESR 3, CRP 4.9     Imaging:  CT chest w/ contrast 12/08/2016: No evidence of thymoma    PFTs 05/23/2022:      EDX:  EMG/NCS 09/19/2020 with Dr. Claudie Leach:  Results:  1. Left median, left ulnar, left radial, right median, right ulnar, right radial and right superficial peroneal SNAPs were normal.  2. Left median, left ulnar, right median, right ulnar and right peroneal CMAPs were normal.  3. Left median and right median F-wave response latencies were normal.  4. Monopolar needle EMG of the right tibialis anterior was normal..  Impression:  This is a normal study. Specifically, there is no electrodiagnostic evidence of a neuropathy in this study.      Assessment:   Judea Palleschi is a 87 y.o. woman with seropositive initially ocular  now generalized myasthenia gravis. She presented to me with worsening MG symptoms after being off IVIG for 6 months. We restarted the treatments, however she again missed a dose because of bronchitis and only received 2 doses, last of it 6 weeks ago. Today as well as in her previous visit she is mildly symptomatic from MG with proximal weakness and minimal bulbar symptoms on my examination. However, she has a high MG-ADL. I suspect this is due to other medical comorbidities, especially COPD. She is currently okay with how she is doing and does not want to try an alternative treatment option and give IVIG more time to work once she back on regular dosing. She also has significant action tremor, consistent with essential tremor. She again did not want any medications that may help to avoid adding more medications to her already long list. We will monitor her symptoms closely.    Plan:  - Continue azathioprine 75 mg BID  - Continue IVIG 1g/kg every 3 weeks  - Continue Mestinon 60 mg TID  - We defer treatment of essential tremor for now  - Patient will schedule an appointment with ENT for new hearing aids.  - Return to clinic in 3 months    Note: Greater than 60 minutes spent on this encounter in consultation and in coordination of care.    Dellie Burns, MD.  Assistant Professor  [facility] Department of Neurology  10/24/2022 3:11 PM

## 2022-10-25 MED ORDER — CLONAZEPAM 1 MG PO TABS
ORAL_TABLET | 0 refills | Status: AC
Start: 2022-10-25 — End: ?

## 2022-10-25 MED ORDER — METHENAMINE HIPPURATE 1 G PO TABS
ORAL_TABLET | 0 refills | Status: AC
Start: 2022-10-25 — End: ?

## 2022-11-09 ENCOUNTER — Ambulatory Visit: Payer: BLUE CROSS/BLUE SHIELD

## 2022-11-15 ENCOUNTER — Telehealth: Payer: BLUE CROSS/BLUE SHIELD

## 2022-11-15 NOTE — Telephone Encounter
Forms have been received, signed and faxed back.

## 2022-11-15 NOTE — Telephone Encounter
Call Back Request      Reason for call back: Aljendra with [facility] called to confirm if home health plan of care has been received. She advised it was sent on 9/16 at 4:32pm. If fax has been received please fax it back to [facility]. If  fax has not been received she is asking for a call back.     CBN: [phone]  FAX: [phone]    Any Symptoms:  []  Yes  [x]  No      If yes, what symptoms are you experiencing:    Duration of symptoms (how long):    Have you taken medication for symptoms (OTC or Rx):      If call was taken outside of clinic hours:    [] Patient or caller has been notified that this message was sent outside of normal clinic hours.     [] Patient or caller has been warm transferred to the physician's answering service. If applicable, patient or caller informed to please call us back if symptoms progress.  Patient or caller has been notified of the turnaround time of 1-2 business day(s).

## 2022-11-16 ENCOUNTER — Telehealth: Payer: BLUE CROSS/BLUE SHIELD

## 2022-11-16 NOTE — Telephone Encounter
[place] from [facility] called wanting to report that yesterday when pt was receiving infusion, pt needed to use the restroom. Pt did not make it to the restroom and urinated on herself. Pt changed her clothes and he heard a sound. He checked on pt and she had fallen and hit her head. He stated per pt, its due to increase bilateral weakness. Pt had stable bp, no headache, no dizziness, no bleeding. Pt just had a bump on her head but not in pain.     No need for a call back but for any questions  CBN [phone]

## 2022-11-29 DIAGNOSIS — Z170 Estrogen receptor positive status [ER+]: Secondary | ICD-10-CM

## 2022-11-29 DIAGNOSIS — C50912 Malignant neoplasm of unspecified site of left female breast: Secondary | ICD-10-CM

## 2022-11-29 DIAGNOSIS — C50911 Malignant neoplasm of unspecified site of right female breast: Secondary | ICD-10-CM

## 2022-11-29 NOTE — Progress Notes
HEMATOLOGY/ONCOLOGY OUTPATIENT NOTE    PATIENT: [name]  MRN: [id number]  DOB: 25-May-1935  DATE OF SERVICE: 12/25/2022  Referring MD: York Ram., MD    Reason for Consultation/ Chief Complaint:  Bilateral breast cancer    HPI   [name] is a 87 y.o. female with PMH including myasthenia gravis, asymmetric hearing loss, fibromyalgia, DM with CKD, major depression, interstitial cystitis, IBS, COPD who presents to establish care for breast cancer.     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 07/19/2017 Screening detected right breast calcifications and distortion in the upper outer quadrant: Biopsy DCIS+ ALH +CSL; 4 cm apart, axilla negative.    Neoadjuvant tamoxifen started 10/30/17, stopped after 4-6 weeks due to dry mouth and mouth sores. Switched to anastrozole 01/29/18, discontinued because it made her throat dry. Declined additional antiestrogen therapy.   Moved on October of 2020 to [place]      [name] then noted left breast pain in April 2022.     Underwent a diagnostic bilateral breast imaging 06/13/2020 which revealed:    - Right breast: solid mass with indistinct margins measuring 14 x 4 x 10 mm in the right breast at 1 o'clock located 6 centimeters from the nipple with associated microclip,  not parallel solid mass with irregular margins measuring 8 x 4 x 5 mm seen in the right breast at 12 o'clock, upper outer quadrant located 6 centimeters from the nipple.    - Left breast: irregular mass with angular margins measuring 10 x 9 x 9 mm seen in the left breast at 11 o'clock located 5 centimeters from the nipple, axillary lymph node in the left axilla with cortical thickness measuring up to 6 mm.    R breast biopsy at 12:00, 6 cm from nipple on 07/13/2020 revealed invasive ductal carcinoma with lobular and focal cribriform features, grade 2 ER (+) PR (+) HER2 (-), Ki-67 5%    L breast biopsy at 11:00, 5 cm from nipple on 07/13/2020 revealed invasive ductal carcinoma with lobular features, grade 2 ER (+) PR (-) HER2 (-), Ki-67 10%     Breast MRI performed on 08/05/2020 that redemonstrated multifocal right breast malignancy and left breast IDC with an additional 4.3cm of NME that spanned nearly the entire left breast that appears suspicious.     [facility] genetic testing (-) for pathogenic variants.     08/17/2020 Patient presents to establish care for breast cancer. She has seen Dr. Janee Morn to discuss surgical treatment, and is quite worried about anesthesia with her myasthenia gravis . She is interested in neoadjuvant therapy     09/13/2020 Patient presents for follow-up of breast cancer. PETCT on 08/22/2020 with no evidence of FDG avid metastatic disease. DXA scan on 08/26/2020 demonstrated osteopenia of the left femoral neck and total hip.     10/28/2020 Patient presents for follow-up of breast cancer. Labs on 09/13/2020 showed WBC 5.3, hgb 11.4, platelets 300, ANC 3.58. Endorses fatigue, frequent diarrhea. BM anytime she eats, usually about 3 times a day. Diarrhea is more frequent in the morning. Also reports hot flashes. Breasts are tender, using a larger bra size.     11/25/2020 Patient presents for follow-up of breast cancer. Labs on 9/2/202 showed WBC 6.7, hgb 12.0, platelets 384,  ANC 4.80. Breast MRI on 11/18/2020 with stable to slightly decreased size of known biopsy proven malignancies in the right breast, decreased size of the irregular mass with microclip  at 11:00, 5 cm from the nipple, consistent with known malignancy, nonmass enhancement spanning 40 mm has also slightly decreased in size and now demonstrates subthreshold kinetics, this remains suspicious, previously described oval enhancing mass at 4:00 is no longer identified.     01/03/2021 Patient presents for follow-up of breast cancer. Labs on 12/20/2020 showed WBC 6.4, hgb 9.7, platelets 278, ANC 5.00.     Hospital admission 10/23-10/25/2022. P/w atypical chest pain x 2-3 days with new LBBB but negative troponin and relief w/ antacid medications most c/f upper GI pathology such as severe GERD, PUD, or gastritis. CT AP without acute abnormality. Initiated pantoprazole 40 mg PO bid with improvement in symptoms with PPI and GI cocktail w/maalox. Patient able to tolerate all meals without recurrent chest pain. GI was consulted and given improvement in symptoms, EGD deferred. Continue Maalox q4h PRN. H pylori stool Ag discontinued given long term PPI use. Continue dexelant outpatient. Ondansetron 4 mg PO q6hr PRN Rx for nausea. Outpatient GI follow-up.     10/18/2021 Patient presents for follow-up of breast cancer. Labs on 09/01/2021 showed WBC 4.94, hgb 11.8, platelets 342. Breast MRI 05/03/2021 with Interval decrease in size of known malignancies in the right breast with trace residual enhancement, Grossly stable irregular mass at 11 o'clock corresponding to biopsy-proven malignancy,  Interval resolution of previously described regional nonmass enhancement in the upper inner, upper outer, and lower outer quadrants. She admits to persistent diarrhea associated with significant weight loss. She often does not eat during the day because she is fearful of leaving the house d/t diarrhea. She does not take much imodium. The diarrhea is dependent on what she eats. Has longstanding hx of IBS. Diarrhea is improved when she takes dexilant but was unable to obtain the dexilant in the preferred form for a while. She denies B symptoms. Admits to occasional vomiting associated with the diarrhea. Continues with IVIG for myasthenia gravis. Has decided she would not like to pursue breast surgery due to age, co morbidities, anaesthetic risks.     11/14/2021 Patient presents for follow-up of breast cancer. Labs on 10/18/2021 showed WBC 4.7, hgb 12.7, platelets 379, ANC 3.81, CEA 5.00. CT chest 11/13/2021 (-). Met with Dr. Lindajo Royal today. Held Arimidex from last visit until yesterday. Diarrhea resolved while she held the Anastrazole. She actually became constipated.     01/11/2022 Patient presents for follow up of breast cancer. MR abd/pelv 11/23/2021 (-). Labs on 01/03/2022 showed WBC 5.37, hgb 12.8, PLT 339. Switched to exemestane last visit    05/22/2022 Patient presents for follow up of bilateral breast cancer. Labs on 03/27/2022 showed WBC 5.89, hgb 12.8, platelets 358. Reports diarrhea has resolved since switching to exemestane    12/25/2022 Patient presents for follow up of bilateral breast cancer. Diagnostic mammo/US 10/17/2022 stable.        ECOG performance status: 0  PAST HISTORY   PMH:  Patient Active Problem List    Diagnosis Date Noted    Epiretinal membrane, bilateral 05/18/2022     Not visually significant  Monitor      PVD (posterior vitreous detachment), bilateral 05/18/2022     No evidence of lesions predisposing to retinal detachment. Retinal detachment precautions reviewed with patient x3.    Discussed warning signs of retinal tear and retinal detachment: If patient experiences worsening flashes, worsening floaters, a fixed-visual field deficit, or any deterioration of vision contact the office immediately even after regular hours.       Chorioretinal scars, bilateral 05/18/2022  Stable  Monitor      Heart failure with reduced ejection fraction LVEF <=40% (HCC/RAF) 11/25/2021    Atypical chest pain 12/20/2020    Acute chest pain 12/18/2020    Immunosuppressed status (HCC/RAF) 03/21/2020    HTN (hypertension) 10/31/2019    Aortic calcification (HCC/RAF) 09/25/2019    Refractive error 04/28/2019     07/03/2022 - Dispensed spectacle prescription for distance and optional near.  Dispensed retaine for visual stability, particularly when eyes are fatigued      Ptosis of eyelid 04/28/2019    Pseudophakia of both eyes 04/28/2019     Patient endorses history of movement during CEIOL with atypical lens placment right eye. Lens appears stable and centered right eye, slightly superior displaced but stable right eye. Monitor Benzodiazepine dependence (HCC/RAF) 12/20/2018    Breast cancer (HCC/RAF) 12/20/2018    Spondylosis without myelopathy or radiculopathy, lumbar region 11/28/2018    Anxiety 11/27/2018    CKD (chronic kidney disease), stage III (HCC/RAF) 11/27/2018    Nocturnal hypoxemia 04/07/2018     Last Assessment & Plan:   We discussed potential impact of hypoxemia from her COPD on other body function  Plan-overnight oximetry      Major depression, recurrent, chronic (HCC/RAF) 12/16/2017    Diabetes mellitus (HCC/RAF) 12/16/2017    Osteoarthritis of knee 05/17/2017    H/O arthroscopy 03/06/2017    Myasthenia gravis with exacerbation (HCC/RAF) 12/18/2016     05/18/2022  Stable with treatment, no double vision. Referral to optom for glasses. All risks, benefits, and alternatives discussed with patient. Follow up when needed      Vaginal atrophy 08/16/2016    Acute medial meniscal tear, left, subsequent encounter 07/18/2016    Renal cyst 03/12/2016    Asthmatic bronchitis, mild persistent, with acute exacerbation 12/29/2015     Last Assessment & Plan:   Continue rescue inhaler as needed. Try adding sample Bevespi maintenance inhaler to see if this helps.  She is currently on prednisone 20 mg daily maintenance and I don't think adding a steroid inhaler component will make any difference.      Urethral caruncle 07/26/2015    Chronic cholecystitis 01/14/2015    Angioedema 05/23/2014     Attributed to injected methylprednisolone    Last Assessment & Plan:   We discussed limited ability to test for possible triggers. We are asking our labs sources about ability to test against methylprednisolone for injection.  A different brand might be worth trying. She can try pre-medicating with an antihistamine.      Chronic interstitial cystitis 10/01/2011    Insomnia 05/31/2007     Qualifier: Diagnosis of   By: Maple Hudson MD, Clinton D    Last Assessment & Plan:   I'm concerned that some of her sleep problems may reflect obstructive sleep apnea based on her palate and which she can tell me about her sleep patterns without a witness. We also want to know about oxygenation during sleep.  Plan-schedule sleep study  Qualifier: Diagnosis of   By: Maple Hudson MD, Clinton D      Last Assessment & Plan:   I'm concerned that some of her sleep problems may reflect obstructive sleep apnea based on her palate and which she can tell me about her sleep patterns without a witness. We also want to know about oxygenation during sleep.  Plan-schedule sleep study      COPD mixed type (HCC/RAF) 04/30/2007     Office Spirometry 03/16/2016-severe obstructive airways disease FVC 1.59/59%, FEV1 0.98/49%, ratio  0.62, FEF 25-75% 0.42/28%  PFT 04/16/16-severe obstructive airways disease with slight response to bronchodilator, severe diffusion defect. FEV1/FVC 0.64, DLCO 45%    Last Assessment & Plan:   Severe obstructive airways disease. She can't hear herself wheeze.  Plan-try samples of Trelegy instead of Anoro for comparison    Overview:   Office Spirometry 03/16/2016-severe obstructive airways disease FVC 1.59/59%, FEV1 0.98/49%, ratio 0.62, FEF 25-75% 0.42/28%  PFT 04/16/16-severe obstructive airways disease with slight response to bronchodilator, severe diffusion defect. FEV1/FVC 0.64, DLCO 45%    Last Assessment & Plan:   Severe obstructive airways disease. She can't hear herself wheeze.  Plan-try samples of Trelegy instead of Anoro for comparison  Office Spirometry 03/16/2016-severe obstructive airways disease FVC 1.59/59%, FEV1 0.98/49%, ratio 0.62, FEF 25-75% 0.42/28%  PFT 04/16/16-severe obstructive airways disease with slight response to bronchodilator, severe diffusion defect. FEV1/FVC 0.64, DLCO 45%    Last Assessment & Plan:   Severe COPD but mainly emphysema with limited bronchospasm component that could be addressed with bronchodilators.  Plan-try sample Bevespi as a maintenance controller, refill pro-air      Esophageal reflux 04/30/2007     History of fundoplication     Last Assessment & Plan:   Emphasized continued attention to antireflux measures. Reminded that reflux can be a significant trigger for irritable airways, cough and wheeze.  History of fundoplication       Last Assessment & Plan:   Emphasized continued attention to antireflux measures. Reminded that reflux can be a significant trigger for irritable airways, cough and wheeze.      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 Management is mostly by her ENT physicians.      Fibromyalgia 04/30/2007     Qualifier: Diagnosis of   By: Yetta Barre CNA/MA, Rose Fillers: Diagnosis of   By: Yetta Barre CNA/MA, Jessica      Seasonal and perennial allergic rhinitis 04/30/2007     Qualifier: Diagnosis of   By: Yetta Barre CNA/MA, Jessica     Last Assessment & Plan:   She is now on prednisone 20 mg daily. I suggested she wait until her myasthenia status is stabilized. Then if she needs to she couldn't seek evaluation at one of the allergy practices in town as discussed, since our vaccine program will be closing.  Qualifier: Diagnosis of   By: Yetta Barre CNA/MA, Jessica       Last Assessment & Plan:   She is now on prednisone 20 mg daily. I suggested she wait until her myasthenia status is stabilized. Then if she needs to she couldn't seek evaluation at one of the allergy practices in town as discussed, since our vaccine program will be closing.     Tobacco: smoked  For 30 years     Surgical History:  No past surgical history on file.    Gyn Hx:  Menses 15   G2P2- first birth 25  Menopause :50's   Menopausal status: Postmenopausal  Hormone therapy use: None      Family and Social History  No family history on file.  Social History     Tobacco Use    Smoking status: Former     Current packs/day: 0.00     Types: Cigarettes     Quit date: 1985     Years since quitting:  39.7    Smokeless tobacco: Former Substance Use Topics    Alcohol use: Not Currently       MEDS     No outpatient medications have been marked as taking for the 12/25/22 encounter (Appointment) with Fredda Hammed., MD.     Allergies   Allergen Reactions    Beta Adrenergic Blockers Other (See Comments)     Avoid d/t Myasthenia Gravis  Avoid d/t Myasthenia Gravis      Levofloxacin Other (See Comments)     Flare of Myasthenia Gravis    Macrolides And Ketolides Other (See Comments)     Avoid d/t Myasthenia Gravis  Use with caution d/t Myasthenia Gravis  Use with caution d/t Myasthenia Gravis      Sulfa Antibiotics Other (See Comments)     May possibly have caused deafness in one ear  May possibly have caused deafness in one ear  other      Botulinum Toxins Other (See Comments)     Muscle weakness r/t Myasthenia Gravis  Muscle weakness r/t Myasthenia Gravis      Pollen Extract Wheezing    Statins Other (See Comments)     Muscle aches  Muscle aches  Muscle aches      Plastic Tape Itching and Other (See Comments)     Turns the skin red where applied       PHYSICAL EXAM     There were no vitals filed for this visit.     There is no height or weight on file to calculate BMI.  Wt Readings from Last 3 Encounters:   10/24/22 62.1 kg (137 lb)   09/25/22 63.4 kg (139 lb 11.2 oz)   09/11/22 63 kg (138 lb 12.8 oz)       Review of Systems:  Pertinent items are noted in HPI.      Physical Exam:  General: No acute distress. Appears well-developed, well-nourished and close to stated age.   Head: Normocephalic, atraumatic.  Eyes: Sclera anicteric. EOMI.  ENT: Hearing grossly normal bilaterally. Oropharynx is clear, mucus membranes are moist.  No oral ulcers noted. Good dentition.  Neck: Supple. Trachea midline.   Cardiac: Regular rate and rhythm. Normal S1, S2. No murmurs, rubs, or gallops.  Respiratory: Clear to auscultation bilaterally. No wheezes, rales, or rhonchi noted. Respiratory effort appears normal.   Abdomen: Soft, nontender and nondistended. Bowel sounds are present and normoactive. No organomegaly is appreciated.  Musculoskeletal: No edema. No cyanosis. Extremities are warm and well-perfused.   Extremities: Warm. No edema. No cyanosis.  Neurologic: Gait appears normal. Sensation intact to light touch in all four extremities. Oriented to person, place and time.   Hematologic: No bruising, purpura or petechiae are noted.   Dermatologic: Skin intact.  No rashes appreciated.   Lymphatic: No palpable cervical, supraclavicular, axillary or inguinal adenopathy appreciated.   Psychiatric: Affect appropriate.  Pleasant and conversant.   Breast exam: no breast masses or axillary adenopathy noted     Recent Labs:  Lab Results   Component Value Date    WBC 5.15 09/25/2022    HGB 11.9 09/25/2022    HCT 36.2 09/25/2022    MCV 99.2 (H) 09/25/2022    PLT 291 09/25/2022      Lab Results   Component Value Date    NA 140 09/25/2022    K 4.9 09/25/2022    CL 104 09/25/2022    CO2 24 09/25/2022    CREAT 1.78 (H) 09/25/2022    BUN  19 09/25/2022     Lab Results   Component Value Date    ALT 13 09/25/2022    AST 26 09/25/2022    ALKPHOS 82 09/25/2022    BILITOT 0.3 09/25/2022     Lab Results   Component Value Date    CALCIUM 9.7 09/25/2022       Pertinent imaging:  10/18/2022 Diagnostic mammogram/US  IMPRESSION:  Finding 1:  Solid mass in the right breast at 1 o'clock and 12 o'clock are known malignancies.  Surgical and oncologic consultation is ongoing. Clinical management of known malignancy is  recommended.  Finding 2:  Microclip in the right breast at 10 o'clock located 2 centimeters from the nipple  corresponds to known high risk lesion. Surgical consultation is ongoing.  Finding 3:  Complicated cyst versus benign appearing solid mass in the right breast at 12 o'clock is  probably benign. Sonographic follow-up in 6 months is recommended.  Finding 4:  Irregular mass in the left breast at 11 o'clock is a known malignancy. This has  mammographically increased in size from 07/13/2020 and sonographically slightly increased in size  from 01/26/2022. Surgical and oncologic consultation is ongoing. Clinical management of known  malignancy is recommended.  Finding 5:  Vague hypoechoic area in the left breast at 12 o'clock is stable from 06/13/2020 and is  benign.  Finding 6:  Complicated cyst versus benign appearing solid mass in the left breast at 1 o'clock is  stable from 06/13/2020 and is benign.  BI-RADS Category 6:  Known Biopsy Proven Malignancy    01/26/2022 Mammo/US diagnostic, bilat  IMPRESSION:  1. Biopsy proven malignancies of right mass at 1:00 and 12:00 both at 6 cm FN (X and ribbon clip)  have decreased in size sonographically. Right breast area with coil microclip stable in appearance  on mammogram. Appropriate action should be taken based on pathology results.  2. Increasing calcifications right breast compared to prior mammogram. Unclear if this is  progression of disease as prior MRI demonstrates trace residual enhancement. If breast conservation  surgery is of interest, stereotactic biopsy may be performed.  3. RIght breast probably benign mass at 12:00 6 cm FN stable, recommend follow up US in 6 months.  4. L breast 11:00 5 cm FN biopsy proven malignancy has slightly increased in size sonographically  compared to prior ultrasound.  5. Probably benign masses of left breast at 12:00 3 cm FN and 1:00 1 cm FN are stable, recommend  follow up US in 6 months.  6. Stable thickened left axillary lymph node, continued surgical/oncologic consultation is  recommended.    11/13/2021 CT chest wo  History of bilateral breast cancer without definite metastatic disease in the thorax     05/03/2021 MR breast  1. Right breast:   *  Interval decrease in size of known malignancies in the right breast with trace residual enhancement. Recommend continued surgical and oncological management. BI-RADS CATEGORY 6 - KNOWN BIOPSY-PROVEN MALIGNANCY.      2. Left breast:  *  Grossly stable irregular mass at 11 o'clock corresponding to biopsy-proven malignancy. Recommend continued surgical and oncological management. BI-RADS CATEGORY 6 - KNOWN BIOPSY-PROVEN MALIGNANCY.   *  Interval resolution of previously described regional nonmass enhancement in the upper inner, upper outer, and lower outer quadrants. Recommend continued surgical management. BI-RADS CATEGORY 4 - SUSPICIOUS.      3. Additional findings:  *  STIR hyperintense fluid collection in the right glenohumeral joint/subacromial region may represent bursitis. Correlate with  symptoms/physical exam.     OVERALL ASSESSMENT: BI-RADS CATEGORY 4 - SUSPICIOUS.    05/03/2021 MR cervical spine  Redemonstrated congenital cervical spinal canal stenosis with superimposed multilevel degenerative changes, which are mildly progressive compared to prior as above. No evidence of cord compression or cord signal abnormality.     03/14/2021 NM stress test  1) There is a moderate-severe, fixed perfusion defect in the distal anteroseptal wall and apex suggestive for a prior MI.      No scintigraphic evidence for stress-induced ischemia.       2) Normal left ventricular size and reduced global LV systolic function, EF 36%. There are regional wall motion abnormalities present as indicated above.     3) For physiologic and electrocardiographic findings, please refer to a separate report.     4) There are no prior studies available for comparison.      12/18/2020 CT abd/pelvis wo contrast  1.  No acute CT abnormality in the abdomen or pelvis.  2.  Moderate hiatal hernia with postsurgical changes likely related to reported prior Nissen fundoplication.  3.  Left-sided diverticulosis without evidence diverticulitis.  4.  History of bilateral breast cancer without evidence of metastatic disease within the abdomen or pelvis.    12/18/2020 CTA chest   1.  No acute aortic process. No acute process within the chest.  2.  History of bilateral breast cancer without evidence of intrathoracic metastatic disease.  3.  Additional findings as above.    11/18/2020 MRI breast bilat  1. Right breast:   *  Stable to slightly decreased size of known biopsy proven malignancies in the right breast. BI-RADS CATEGORY 6 - KNOWN BIOPSY-PROVEN MALIGNANCY. Recommend continued surgical and oncologic management.      2. Left breast:  *  Decreased size of the irregular mass with microclip at 11:00, 5 cm from the nipple, consistent with known malignancy. BI-RADS CATEGORY 6 - KNOWN BIOPSY-PROVEN MALIGNANCY. Recommend continued surgical and oncologic management.   *  Nonmass enhancement spanning 40 mm has also slightly decreased in size and now demonstrates subthreshold kinetics. This remains suspicious. If breast conservation is desired, an MRI guided biopsy is recommended. BI-RADS CATEGORY 4 - SUSPICIOUS.  *  Previously described oval enhancing mass at 4:00 is no longer identified. BI-RADS CATEGORY 2.     OVERALL ASSESSMENT: BI-RADS CATEGORY 4 - SUSPICIOUS.    08/26/2020 DXA lumbar spine/hip  -----------------------------------------------------------------   Region                   BMD    T-score  Z-score   Classification   -----------------------------------------------------------------   AP Spine(L1-L4)          1.062    0.1      2.9       Normal   Femoral Neck (Left)      0.723   -1.1      1.3       Osteopenia   Total Hip (Left)         0.725   -1.8      0.4       Osteopenia   -----------------------------------------------------------------   Impression: The BMD for  the AP Spine(L1-L4) decreased, changing by -2.4% since the last DXA exam.     08/22/2020 PETCT  1.  Mild to moderate left greater than right FDG activity associated with small soft tissue densities in the bilateral breasts in the regions marked by breast biopsy clips.   2.  No evidence  of FDG avid metastatic disease.    06/19/2019 US pelvis  Small subserosal uterine fundal fibroid. Otherwise normal postmenopausal pelvic ultrasound.        Pertinent pathology:  03/21/2022 Tissue exam  FINAL DIAGNOSIS  BREAST, RIGHT, CALCIFICATIONS, 10:00, 2 CM FROM NIPPLE (MAMMO-GUIDED CORE  BIOPSY):  - Atypical ductal hyperplasia (ADH)  - Background breast with fibrosis, microcysts, and apocrine metaplasia  - Calcifications are present within ADH, apocrine metaplasia, and benign stroma  - No carcinoma identified (multiple deeper levels examined     08/02/2020 [facility] genetics      07/13/2020 Tissue exam  A. BREAST, RIGHT, MASS, 12:00, 6 CM FROM NIPPLE (NEEDLE CORE BIOPSY):  - Invasive ductal carcinoma with lobular and focal cribriform features, grade 2 (50% of biopsy; longest segment 4 mm)             Modified Bloom and Richardson score: 6 of 9                        Tubule formation:                3                        Nuclear pleomorphism:       2                        Mitotic score:                       1  - Ductal carcinoma in situ (DCIS), low nuclear grade, cribriform type with focal central necrosis  - Breast biomarkers: See below  - ERBB2 (HER2) FISH: Negative for HER2 gene amplification      B.  BREAST, LEFT, MASS, 11:00, 5 CM FROM NIPPLE (NEEDLE CORE BIOPSY):  - Invasive ductal carcinoma with lobular features, grade 2 (50% of biopsy; longest involved segment 6 mm)             Modified Bloom and Richardson score: 6 of 9                        Tubule formation:                3                        Nuclear pleomorphism:       2                        Mitotic score:                       1  - Ductal carcinoma in situ (DCIS) is not identified  - Breast biomarkers: See immunohistochemistry report below  - ERBB2 (HER2) FISH: Negative [based on IHC (2+) and FISH, see comment].     COMMENT: It is uncertain whether patients with an average of >4.0 and <6.0 HER2 signals per cell and a HER2/CEP17 ratio of <2.0 benefit from HER2-targeted therapy in the absence of protein overexpression (IHC 3+). If the specimen test result is close to the [facility] ratio threshold for positive, there is a higher likelihood that repeat testing will result in different results by chance alone. Therefore, when [facility] results are not 3+ positive, it is recommended that the  sample be considered negative without additional testing on the specimen.    BREAST BIOMARKER REPORT     BLOCK:  A1  FIXATIVE:  Formalin      RESULT ER/PR:    ESTROGEN   RECEPTOR PROGESTERONE RECEPTOR   Antibody Clone SP1 Clone 636   %Tumor Staining >95% >95%   Intensity (1+ to 3+) 3+ 3+      RESULT HER-2/neu IHC assay (utilizing FDA-approved DAKO Hercep Test): Arch Pathol Lab Med [phone], ASCO/CAP HER2 Testing in Breast Cancer Update - Evelene Croon et al     Test Score: 1+     HER2/neu:  Negative     RESULT Ki-67 (Clone MIB1):  5%     BREAST BIOMARKER REPORT     BLOCK:  B1  FIXATIVE:  Formalin      RESULT ER/PR:    ESTROGEN   RECEPTOR PROGESTERONE RECEPTOR   Antibody Clone SP1 Clone 636   %Tumor Staining >95% 0%   Intensity (1+ to 3+) 3+ NA      RESULT HER-2/neu IHC assay (utilizing FDA-approved DAKO Hercep Test): Arch Pathol Lab Med 2018:1-20, ASCO/CAP HER2 Testing in Breast Cancer Update - Veronia Beets al     Test Score: 2+     HER2/neu:  Equivocal     RESULT Ki-67 (Clone MIB1):  10%    07/19/2017 Outside pathology        ASSESSMENT & PLAN   Annaya Altobelli is a 87 y.o. female presents for     1. 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 She elected not to have surgery at that time. She took tamoxifen -->anastrozole for a short period of time, but discontinued endocrine therapy due to side effects.   - R breast biopsy at 12:00, 6 cm from nipple on 07/13/2020 revealed invasive ductal carcinoma with lobular and focal cribriform features, grade 2 ER (+) PR (+) HER2 (-), Ki-67 5%  - L breast biopsy at 11:00, 5 cm from nipple on 07/13/2020 revealed invasive ductal carcinoma with lobular features, grade 2 ER (+) PR (-) HER2 (-), Ki-67 10%  - We had a long discussion about biology and treatment of breast cancer, with emphasis that cure can only be attained with multimodality treatments such as surgery/ +/- radiation and systemic ( endocrine) therapy .  We have discussed that given her concern for anesthesia with Myasthenia Gravis, we can try neoadjuvant endocrine therapy with close observation of side effects.  - Letrozole 2.5 mg po daily 08/17/2020 --> 10/28/2020 discontinued due to diarrhea  - Tumors are not palpable on exam, and she would have to be followed with imaging   -  PETCT on 08/22/2020 with no evidence of FDG avid metastatic disease.   - Arimidex 10/28/2020 --->   - Breast MRI on 11/18/2020 with stable to slightly decreased size of known biopsy proven malignancies in the right breast, decreased size of the irregular mass with microclip at 11:00, 5 cm from the nipple, consistent with known malignancy, nonmass enhancement spanning 40 mm has also slightly decreased in size and now demonstrates subthreshold kinetics, this remains suspicious, previously described oval enhancing mass at 4:00 is no longer identified.   - CT CAP on 12/18/2020 without evidence of metastatic disease   - Breast MRI on 11/18/2020 with stable to slightly decreased size of known biopsy proven malignancies in  the right breast, decreased size of the irregular mass with microclip at 11:00, 5 cm from the nipple, consistent with known malignancy, nonmass enhancement spanning 40 mm has also slightly decreased in size and now demonstrates subthreshold kinetics, this remains suspicious, previously described oval enhancing mass at 4:00 is no longer identified.  - Held Arimidex 1 mg po daily d/t diarrhea 8/23 - 11/13/2021   - Patient has opted against breast surgery d/t age, co morbidities, anaesthetic risk iso myasthenia gravis  - She would like to explore alternatives to surgery such as radiation. Discussed case with rad/onc team and will obtain repeat breast imaging. She consulted with Dr. Lindajo Royal who advised that she is not currently a candidate for RT  - MR abd/pelv 11/23/2021 (-)   - Will try switching to Exemestane 25mg  po daily to evaluate for improvement in diarrhea- suspect class effect, but will try 11/14/2021 --->  - Diagnostic bilateral MMG/US 01/26/2022 with biopsy proven malignancies decreased in size sonographically. Increasing calcifications right breast compared to prior mammogram. Right breast probably benign mass at 12:00 6 cm FN stable, recommend follow up US in 6 months. Probably benign masses of left breast at 12:00 3 cm FN and 1:00 1 cm FN are stable, recommend follow up US in 6 months. Stable thickened left axillary lymph node, continued surgical/oncologic consultation is recommended  - S/p stereotactic bx of right breast mass 03/21/2022. Pathology shows ADH     12/25/2022   - Continue exemestane 25mg  po daily   - Repeat labs today   - Diagnostic mammo/US 10/17/2022 with stable findings. Repeat imaging in 6 months       2.  Diarrhea  - Longstanding, hx of IBS, but exacerbated by AI use, similar on letrozole and arimidex.  - Consult with Dr. Riley Kill 02/10/2021. May be related to aromatase inhibitor. However she states it has been worse in the last 1-2 months and this medication class was started earlier in the year (attributed diarrhea to letrozole, swithced to arimidex in Sept). Will r/o infection, check FC. Can consider SIBO evaluation, colonoscopy for microscopic colitis.   - Held anastrazole 10/18/2021-->11/13/2021 with resolution of diarrhea.   - MR Abd/Pelv for further assessment given >20lbs weight loss associated with diarrhea 9/28 (-) for metastatic disease.   - Strongly encouraged patient to submit stool studies ordered by Dr. Riley Kill  - Will start with 1/2 tablet imodium q AM and increase to 1 tablet daily as needed  - Consider lomotil if imodium is ineffective or not tolerated  -  Switched to exemestane 11/14/2021 -->       3.  Myasthenia gravis   - F/u w Dr. Claudie Leach, Neurology, on 08/15/2021. Referral for PT regarding neck exercise. Referral for Acupuncture. Physical Therapy referral for gait and balance training for cervical spinal stenosis. Contact number for scheduling: [phone]. Continue IVIg 60g (1g/kg) frequency to every 3 weeks at home with Allur.Continue Azathioprine 75 mg twice per day by mouth.       4.  Chest pain  - Hospital admission 10/23-10/25/2022. P/w atypical chest pain x 2-3 days with new LBBB but negative troponin and relief w/ antacid medications most c/f upper GI pathology such as severe GERD, PUD, or gastritis. CT AP without acute abnormality. Initiated pantoprazole 40 mg PO bid with improvement in symptoms with PPI and GI cocktail w/maalox. Patient able to tolerate all meals without recurrent chest pain. GI was consulted and given improvement in symptoms, EGD deferred. Continue Maalox q4h PRN. H pylori stool Ag  discontinued given long term PPI use. Continue dexelant outpatient. Ondansetron 4 mg PO q6hr PRN Rx for nausea.   - Outpatient GI follow-up scheduled w Dr. Riley Kill on 01/12/2021        5. Bone density  - DXA scan on 08/26/2020 demonstrated osteopenia of the left femoral neck and total hip       DIAGNOSES/ORDERS addressed on 12/25/2022:  1. Bilateral malignant neoplasm of breast in female, estrogen receptor positive, unspecified site of breast (HCC/RAF)        - on exemestane  - continue close monitoring for toxicity of intensive drug therapy.    NEW PROBLEMS AS OF 12/25/2022:     []  New minor or self-limited problem (99202/99212).  []  2+ minor or self-limited problem, stable chronic illness, acute uncomplicated illness/injury (99203/99213).  []  Chronic illness with exacerbation/progression/tx side effect; 2+ stable chronic illnesses, new problem with uncertain prognosis, acute illness with systemic symptoms, or acute complicated injury (99204/99214).  []  Chronic illness with severe exacerbation/progression/tx side effect, acute/chronic illness or injury that poses threat to life or bodily function (99205/99215).      REVIEW OF DATA   I have  [x]  Reviewed/ordered []  1 []  2 [x]  >= 3 unique laboratory, radiology, and/or diagnostic tests noted below  No orders of the defined types were placed in this encounter.      [x]  Reviewed []  1 []  2 [x]  >= 3 prior external notes and incorporated into patient assessment   I reviewed Dr. Velvet Bathe, MD in Surgery, Surgical Oncology on 03/07/2022.  I reviewed Dr. York Ram, MD in Medicine, Internal Medicine on Not Found.  I reviewed Dr. Not Found in Not Found on Not Found.    []  Discussed management or test interpretation with external provider(s) on 12/25/2022 as noted:    RISK OF COMPLICATION   This writer has deemed the above diagnoses to have a risk of complication, morbidity or mortality of:   []  Minimal  []  Low  [x]  Moderate   []  due to prescription drug management   [] Decision re: minor surgery   [] Diagnosis or treatment limited by social determinants of health  []  Severe    []  due to intensive monitoring for chemotherapy toxicity   []  Decision elective surgery with risk factors  []  Decision regarding need for hospitalization  []  Advanced Care Planning decisions    SOCIAL DETERMINANTS OF HEALTH   []  The diagnosis or treatment of said conditions is significantly limited by social determinants of health:      I spent 30 minutes counseling and coordinating care for the items checked below on the day of service 12/25/2022  [x]  Preparing to see the patient (e.g., review of tests)  [x]  Obtaining and or reviewing separately obtained history   [x]  Performing a medically appropriate examination and/or evaluation   [x]  Counseling and educating the patient/family/caregiver   [x]  Ordering medications, tests, or procedures  []  Referring and communicating with other healthcare professionals (when not separately reported)  [x]  Documenting clinical information in the EHR  []  Independently interpreting results and communicating results to patient/ family/caregiver     No follow-ups on file.       The above recommendation were discussed with the patient. The patient has all questions answered satisfactorily and is in agreement with this recommended plan of care.    Scribe Signature:  I, Drue Novel, am working as a Neurosurgeon for and in the presence of Dr. Marisue Ivan on 12/25/2022

## 2022-12-09 NOTE — Progress Notes
OUTPATIENT GERIATRICS CLINIC NOTE    PATIENT:  [name]   MRN:  [id number]  DOB:  1936/01/15  DATE OF SERVICE:  12/11/2022  PRIMARY CARE PHYSICIAN: York Ram., MD    CHIEF COMPLAINT:   No chief complaint on file.      [facility]:  [facility]--Neurology      [employer] [facility]:    Chaperone status:  No data recorded    HISTORY OF PRESENT ILLNESS     [name] is a 87 y.o. female who presents today for *follow up**. Patient is accompanied by *no one**. History today is per the patient,and review of available recent records in [facility] and Care Everywhere.     Patient is a(n) [x]  reliable []  unreliable historian and additional collateral information was obtained during the visit today from:         Just got another IVIG.  Feels very weak.            Per chart review, pertinent medical history:  Past Medical History:   Diagnosis Date    Breast cancer (HCC/RAF) 12/20/2018     No past surgical history on file.    ALLERGIES     Allergies   Allergen Reactions    Beta Adrenergic Blockers Other (See Comments)     Avoid d/t Myasthenia Gravis  Avoid d/t Myasthenia Gravis      Levofloxacin Other (See Comments)     Flare of Myasthenia Gravis    Macrolides And Ketolides Other (See Comments)     Avoid d/t Myasthenia Gravis  Use with caution d/t Myasthenia Gravis  Use with caution d/t Myasthenia Gravis      Sulfa Antibiotics Other (See Comments)     May possibly have caused deafness in one ear  May possibly have caused deafness in one ear  other      Botulinum Toxins Other (See Comments)     Muscle weakness r/t Myasthenia Gravis  Muscle weakness r/t Myasthenia Gravis      Pollen Extract Wheezing    Statins Other (See Comments)     Muscle aches  Muscle aches  Muscle aches      Plastic Tape Itching and Other (See Comments)     Turns the skin red where applied        MEDICATIONS     Personally reviewed.    Medications that the patient states to be currently taking   Medication Sig    Albuterol Sulfate AEPB Takes 1 to 2 puffs every 4 hours as needed for wheezing or SOB Inhale Takes 1 to 2 puffs every 4 hours as needed for wheezing or SOB ..    amoxicillin-clavulanate 500-125 mg tablet Take 1 tablet by mouth two (2) times daily.    azaTHIOprine 50 mg tablet Take one and one-half tablet (75 mg total) by mouth two (2) times daily.    buPROPion, SR, (WELLBUTRIN SR) 200 mg 12 hr tablet Take 1 tablet (200 mg total) by mouth two (2) times daily.    CLONAZEPAM 1 mg tablet take 1 tablet by mouth at night    dapagliflozin 10 mg tablet Take 1 tablet (10 mg total) by mouth daily.    DEXILANT 60 MG DR capsule TAKE 1 CAPSULE (60 MG TOTAL) BY MOUTH DAILY    DULOXETINE 20 mg DR capsule TAKE 3 CAPSULES (60 MG TOTAL) BY MOUTH DAILY    EPINEPHrine 0.3 mg/0.3 mL auto-injector     Evolocumab (REPATHA SURECLICK) 140 MG/ML  SOAJ Inject 1 pen. under the skin every fourteen (14) days.    exemestane 25 mg tablet Take 1 tablet (25 mg total) by mouth daily.    fluticasone-vilanterol (BREO ELLIPTA) 100-25 mcg/inh inhaler Inhale 1 puff daily.    GAMMAKED 5 GM/50ML infusion     losartan 100 mg tablet Take 1 tablet (100 mg total) by mouth daily.    METHENAMINE HIPPURATE 1 g tablet take one tablet by mouth two times a day    montelukast 10 mg tablet Take 1 tablet (10 mg total) by mouth as needed for.    predniSONE 20 mg tablet One daily for 5 days.    PYRIDOSTIGMINE 60 mg tablet TAKE 1 TABLET (60 MG TOTAL) BY MOUTH THREE (3) TIMES DAILY AS NEEDED    trimethoprim 100 mg tablet Take 1 tablet (100 mg total) by mouth daily.       SOCIAL HISTORY     Social History     Social History Narrative    Not on file        FUNCTIONAL STATUS     BADLs:  IADLs:    Ambulates with   Falls in past year:  Afraid of falling:    GERIATRIC REVIEW OF SYSTEMS     Vision:    []   glasses     []  legally blind  Hearing: []  hearing aids    Nutrition: []  normal   []  impaired  []  vegan  []  vegetarian  []  low salt  []  low-carb   Swallowing: []  impaired   Dentures:   []  yes    Depression:  []  yes   Cognition:     Incontinence: [] urine  []  fecal  []  urine and fecal      ADVANCED CARE PLANNING     Advanced directives on file: []  No  []   Yes ? Completed:     Medical DPOA on file:   []   Yes ?   []   No ? Patient designates ** * to be their surrogate medical decision-maker.         HEALTH CARE MAINTENANCE     IMMUNIZATIONS:   Immunization History   Administered Date(s) Administered    COVID-19 [employer] Fall 2023 12y and up) PF, 30 mcg/0.3 mL 03/27/2022    COVID-19, mRNA, (Pfizer - Purple Cap) 30 mcg/0.3 mL 03/11/2019, 04/01/2019, 10/29/2019    COVID-19, mRNA, bivalent (Pfizer) 30 mcg/0.3 mL (12y and up) 11/25/2020, 07/05/2021    COVID-19, mRNA, tris-sucrose (Pfizer - [employer]) 30 mcg/0.3 mL 06/20/2020    DTaP 12/31/2015    influenza vaccine IM quadrivalent (Fluzone Quad) MDV (21 months of age and older) 11/26/2016    influenza vaccine IM quadrivalent adjuvanted (FluAD Quad) (PF) SYR (34 years of age and older) 11/05/2018, 12/01/2019, 11/25/2020    influenza vaccine IM quadrivalent high dose (Fluzone High Dose Quad) (PF) SYR (56 years of age and older) 11/22/2021    influenza, unspecified formulation 05/10/2014, 12/29/2015, 11/26/2016, 04/01/2019, 04/01/2019, 12/01/2019, 12/01/2019, 12/01/2019, 12/01/2019, 11/25/2020, 11/25/2020, 01/03/2022    pneumococcal conjugate vaccine 20-valent (Prevnar 20) 07/05/2021    pneumococcal polysaccharide vaccine 23-valent (Pneumovax) 01/10/2019       Mammogram:  DXA:  PAP:  Colonoscopy:      PHYSICAL EXAM     BP 137/71  ~ Pulse 88  ~ Temp 36.2 ?C (97.1 ?F) (Forehead)  ~ Resp 16  ~ Ht 5' 5'' (1.651 m)  ~ Wt 136 lb (61.7 kg)  ~ SpO2 96%  ~ BMI 22.63 kg/m?  Wt Readings from Last 3 Encounters:   10/24/22 137 lb (62.1 kg)   09/25/22 139 lb 11.2 oz (63.4 kg)   09/11/22 138 lb 12.8 oz (63 kg)         System Check if normal Positive or additional negative findings   GEN  [x]  NAD     Eyes  []  Conj/Lids []  Pupils  []  Fundi   []  Sclerae []  EOM     ENT  []  External ears   []  Otoscopy   []  Gross Hearing    []   External nose   []  Nasal mucosa   []  Lips/teeth/gums    []  Oropharynx    []  Mucus membranes      Neck  [x]  Inspection/palpation    [x]  Thyroid     Resp  [x]  Effort    [x]  Auscultation       CV  [x]  Rhythm/rate   [x]  Murmurs   [x]  Edema   []  JVP non-elevated    Normal pulses:   []  Radial []  Femoral  []  Pedal     Breast  []  Inspection []  Palpation     GI  []  Bowel sounds    [x]  Nontender   [x]  No distension    []  No rebound or guarding   []  No masses   []  Liver/spleen    []  Rectal     GU  F:  []  External []  vaginal wall         []  Cervix     []  mucus        []  Uterus    []  Adnexa   M:  []  Scrotum []  Penis         []  Prostate     Lymph  []  Cervical []  supraclavicular     []  Axillae   []  Groin/inguinal     MSK []  Gait  []  Back     Specify site examined:    []  Inspect/palp []  ROM   []  Stability []  Strength/tone []  Used arms to push up from seated to standing position   Assistive device:  []  single point cane []  quad cane  []  FWW []  rollator walker      Skin  []  Inspection []  Palpation     Neuro  []  Alert and oriented     []  CN2-12 intact grossly   []  DTR      []  Muscle strength      []  Sensation   []  Pronator drift   []  Finger to Nose/Heel to Shin   []  Romberg     Psych  []  Insight/judgement     []  Mood/affect    []  Gross cognition            LABS/STUDIES     LABS:  Lab Results   Component Value Date    WBC 5.15 09/25/2022    WBC 6.26 07/05/2022    WBC 5.89 03/27/2022    HGB 11.9 09/25/2022    HGB 13.6 07/05/2022    HGB 12.8 03/27/2022    MCV 99.2 (H) 09/25/2022    PLT 291 09/25/2022    PLT 335 07/05/2022    PLT 358 03/27/2022     Lab Results   Component Value Date    NA 140 09/25/2022    NA 139 07/05/2022    NA 138 05/05/2022    K 4.9 09/25/2022    K 4.5 07/05/2022    K 4.6 05/05/2022    CREAT 1.78 (H) 09/25/2022  CREAT 1.56 (H) 07/05/2022    CREAT 1.67 (H) 05/05/2022    GFRESTNOAA 29 06/09/2020    GFRESTNOAA 28 05/18/2020    GFRESTNOAA 30 03/23/2020    GFRESTAA 34 06/09/2020    GFRESTAA 33 05/18/2020    GFRESTAA 35 03/23/2020    CALCIUM 9.7 09/25/2022    CALCIUM 9.8 07/05/2022    CALCIUM 9.5 05/05/2022     Lab Results   Component Value Date    ALT 13 09/25/2022    ALT 11 07/05/2022    AST 26 09/25/2022    AST 23 07/05/2022    ALKPHOS 82 09/25/2022    BILITOT 0.3 09/25/2022    ALBUMIN 4.0 09/25/2022    ALBUMIN 3.8 (L) 07/05/2022     Lab Results   Component Value Date    TSH 1.6 07/05/2022    TSH 2.3 01/03/2022    TSH 2.7 09/01/2021     Lab Results   Component Value Date    HGBA1C 5.9 (H) 07/05/2022    HGBA1C 5.3 05/03/2022    HGBA1C 5.2 09/01/2021     Lab Results   Component Value Date    CHOL 205 07/05/2022    CHOL 226 01/07/2022    CHOL 237 09/01/2021     Lab Results   Component Value Date    CHOLDLQ 135 (H) 07/05/2022    CHOLDLQ 144 (H) 01/07/2022    CHOLDLQ 147 (H) 09/01/2021     Lab Results   Component Value Date    VITD25OH 76 10/18/2021     Lab Results   Component Value Date    FE 87 07/14/2019    FERRITIN 57 07/14/2019    FOLATE 9.6 11/25/2019    TIBC 356 07/14/2019     Lab Results   Component Value Date    VITAMINB12 3,994 (H) 11/25/2019    VITAMINB12 217 (L) 09/22/2019     Lab Results   Component Value Date    BNP 156 (H) 06/03/2021    BNP 92 09/22/2019     No results found for: ''PSATOTAL''      STUDIES:      XR CHEST PA LAT 2V  September 22, 2019   COMPARISON: [facility] December 17, 2018     History: sob     FINDINGS:     Lungs: Clear  Heart/aorta: Normal heart size. The thoracic aorta is mildly calcified, tortuous and ectatic.  Adenopathy: None  Pleura: No effusion  Bones and Chest wall: No acute bony or body wall  findings. Osteopenia and bony maturational changes. Right upper quadrant cholecystectomy clips stable from October 2020        IMPRESSION:     No acute findings or abnormalities related to provided history.      MRI C spine  May 03, 2021  IMPRESSION:  Redemonstrated congenital cervical spinal canal stenosis with superimposed multilevel degenerative changes, which are mildly progressive compared to prior as above. No evidence of cord compression or cord signal abnormality.      Echo  Jan 2023  1. Normal left ventricular size.  2. There are LV regional wall motion abnormalities.  3. Left ventricular ejection fraction is approximately 35 to 40%.  4. Abnormal LV diastolic function.  5. There are no prior studies on this patient for comparison purposes.  ASSESSMENT and PLAN     [name] is a 87 y.o. female who presents today for *follow up**.    #Major Depression--active--self d/ced duloxetine.  Rechallenge.  Tolerating Duloxetine.  #  Myesthenia Gravis--worse.  Labs and exam not revealing obvious other etiology for her weakness.  Suspect worsening of MG.  Will reach out to Dr. Hermenia Fiscal for guidance.  #HFrEF--EF 35-40%noted on echo-- 2023--Not Worsening.  Chronic.  Will monitor and treat underlying risk factors with medical and lifestyle interventions as warranted by balance between benefits and burdens.   #CAD--discussed medical management alone with shared decision making model May 11, 2021.  Declines staint  #Cervical myelopathy--monitored by neuro--chronic--re-imaged on MRI as above.  CTM.  #HTN--Increased nifedipine CR 60 mg daily. Continue losartan 50 mg.  BP Better.    #CKD--stage 4--chronic--CTM--control BP as tolerated by symptoms of orthostasis and fatigue.  Trial off trimethoprim.  REcheck Cr now and in two weeks.  #CAD--patient leaning towards medical management.  #Aortic Calcification--noted on chest imaging September 22, 2019--Not Worsening.  Chronic.  Will monitor and treat underlying risk factors with medical and lifestyle interventions as warranted by balance between benefits and burdens.  #Back pain--to refer to Pain for discussion of epidural injection.  #allergic rhinitis--trial flonase  #Macrocytosis--  #Deconditioning--initiate home PT  #GERD--s/p Nisan fundoplication  #COPD--mixed--per PFT's from [name].--albuterol prn.  May resume other inhalders.  #s/p rectocele repair  #s/p cholecystectomy  #Immunosupressed status--medication related.  Chronic. CTM  #Nocturnal hypoxemia--presumably related to MG crisis but has history of COPD.  Will order nocturnal home O2 study and consider PFT's/Pulmonary evaluation once settled down.  May need to restart BREO inhaler.  #Myasthenia Gravis--s/p plasmapheresis x5 October 2020, no Thymoma, on Azathioprine 150 daily and mestinon 60 tid.  Saw [facility]. Enedina Finner at [facility].  Was considering Soliris.  Ordering IVIG and MRI.  Encourage ongoing neurology follow up.   #IBS--titrate miralax to comfort.    #Interstitial cystitis--stopping daily Trimethoprim 100 for UTI prophylaxis.  Using bladder instillations prn. OFF topical estrogen due to advice from neurologist re: MG.  To see Dr. Lorenz Coaster.  Suspect recent flare related to breast CA re-diagnosis.  Doubt pseudomonas bactiuria is pathogen at present.  WIll complete current meropenum.  #Allergic rhinitis--previously on nasal steroids.  #Breast CA--under care.  Patient continues to decline surgery.  Wondering about additional treatment options.  Will direct to med-onc for discussion.  #DM with CKD--stabe 3b--based on labs from [name].  FOllow here.  If BP permits, may consider ARB BUT patient with h/o angioedema so will ONLY initiate this agent with great caution.  Refer to Ophthalmology.  #OA knees--previously getting hyaluronic acid injections.  #Anxiety  #Benzodiazepine Dependence--uncomplicated--chronic.  Encourage patient to decrease intake--ideally to abstaining but will work towards this goal over time.  Not likely to achieve this given ongoing concerns re: breast ca.  #Fibromyalgia--duloxetine as above.   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  #Statin intolerance      FOLLOW-UP     RTC     Future Appointments   Date Time Provider Department Center   12/11/2022  1:40 PM York Ram., MD GERI IMS [address] [place]/Cen   12/12/2022  2:00 PM Clelia Croft, MD CRD DIS [address] [facility]   12/25/2022  1:40 PM Fredda Hammed., MD HEM/ONC [address] [facility]   01/30/2023  2:00 PM Dellie Burns, MD [facility] [address] [place]/Cen   07/02/2023 11:30 AM Luis Abed., OD DS VIS [facility]/Cen         The above plan of care, diagnosis, orders, and follow-up were discussed with the patient and/or surrogate. Questions related to this recommended plan of care were answered.    ANALYSIS OF DATA (Needs  to meet 1 category for moderate and 2 categories for high LOS)     I have:     Category 1 (Needs 3 for moderate and high LOS)     []  Reviewed []  1 []  2 []  >= 3 unique laboratory, radiology, and/or diagnostic tests noted below    Test/Study:  on date .    []  Reviewed []  1 []  2 []  >= 3 prior external notes and incorporated into patient assessment    I reviewed Dr. 's note in specialty  from date .    []  Discussed management or test interpretation with external provider(s) as noted      []  Ordered []  1 []  2 []  >= 3 unique laboratory, radiology, and/or diagnostic tests noted in A&P    []  Obtained history from independent historian:       Category 2  []  Independently interpreted the test    Category 3  []  Discussed management or test interpretation with external provider(s) as noted      INTERACTION COMPLEXITY and SOCIAL DETERMINANTS of HEALTH       PROBLEM COMPLEXITY   []  New problem with uncertain diagnosis or prognosis (moderate)   []  Multiple stable chronic problems (moderate)   []  Chronic problem not stable - not controlled, symptomatic, or worsening (moderate)   []  Severe exacerbation of chronic problem (high)   []  New or chronic problem that poses threat to life or bodily function (high)     MANAGEMENT COMPLEXITY   []  Old or external/outside records reviewed   []  Discussion with alternate (proxy) if patient with impaired communication / comprehension ability (e.g., dementia, aphasia, severe hearing loss).   []  Repeated questions (or disagreement) between patient and/among caregivers/family during the visit.  []  Caregiver/patient emotions/behavior/beliefs interfering with implementation of treatment plan.  []  Independent interpretation of test (EKG, Chest XRay)   []  Discussion of case with a consultant physician     RISK LEVEL   []  Prescription drug management (moderate)   []  Minor surgery with CV risk factors or elective major surgery (moderate)   []  Dx or Rx significantly limited by SDoH (inadequate housing, living alone, poor health care access, inappropriate diet; low literacy) (moderate)   []  Major surgery - elective with CV risk factors or emergent (high)   []  Need for hospitalization (high)   []  New DNR or de-escalation of care (high)      SDoH  The diagnosis or treatment of said conditions is significantly limited by the following social determinants of health:  []  Z59.0 Homelessness  []  Z59.1 Inadequate housing  []  Z59.2 Discord with neighbors, lodgers and landlord  []  Z60.2 Problems related to living alone  []  Z59.8 Other problems related to housing and economic circumstances  []  Z59.4 Lack of adequate food and safe drinking water  []  Z59.6 Low income  []  Z59.7 Insufficient social insurance and welfare support  []  Z59.9 Problems related to housing and economic circumstances, unspecified  []  Z75.3 Unavailability and inaccessibility of health care facilities  []  Z75.4 Unavailability and inaccessibility of other helping agencies  []  Z72.4 Inappropriate diet and eating habits  []  Z62.820 Parent-biological child conflict  []  Z63.8 Other specified problems related to primary support group  []  Z55.0 Illiteracy and low level literacy   []  Z56.9 Unspecified problems related to employment        If Billing Based on Time:     I performed the following items on the day of service:    [x]  Preparing to see  the patient (e.g., review of tests)  [x]  Obtaining and/or reviewing separately obtained history   [x]  Performing a medically appropriate examination and/or evaluation   [x]  Counseling and educating the patient/family/caregiver   [x]  Ordering medications, tests, or procedures  [x]  Referring and communicating with other healthcare professionals (when not separately reported)  [x]  Documenting clinical information in the EHR  []  Independently interpreting results and communicating results to patient/family/caregiver    I spent the following total amount of time on these tasks on the day of service:  New Patient     Established Patient  []  15-29 minutes - 99202    []  up to 9 minutes - 99211  []  30-44 minutes - 99203     []  10-19 minutes - 99212   []  45-59 minutes - 99204      []  20-29 minutes - 99213   []  60-74 minutes - 99205   [x]  30- minutes - 99214         []  40-55 minutes - 99215    []  I spent an additional ** * 15-minute-increment(s) for a total of * ** minutes on these tasks on the day of service. [id number] for each additional 15 minutes.)    Author: York Ram, MD 12/11/2022    [phone] billed today:    I have a longitudinal care relationship with this patient and am the focal point of ongoing patient care related to the serious chronic and complex condition(s) assessed today as noted above in the note.   I assume responsibility of ongoing management of said condition(s) over time.      Myasthenia Gravis  Pain Management

## 2022-12-11 ENCOUNTER — Ambulatory Visit: Payer: PRIVATE HEALTH INSURANCE | Attending: Geriatric Medicine

## 2022-12-11 DIAGNOSIS — Z23 Encounter for immunization: Secondary | ICD-10-CM

## 2022-12-11 DIAGNOSIS — R5383 Other fatigue: Secondary | ICD-10-CM

## 2022-12-12 ENCOUNTER — Ambulatory Visit: Payer: BLUE CROSS/BLUE SHIELD

## 2022-12-12 ENCOUNTER — Telehealth: Payer: BLUE CROSS/BLUE SHIELD

## 2022-12-12 DIAGNOSIS — I10 Essential (primary) hypertension: Secondary | ICD-10-CM

## 2022-12-12 DIAGNOSIS — M545 Complains of low back pain: Secondary | ICD-10-CM

## 2022-12-12 DIAGNOSIS — I5022 Chronic systolic (congestive) heart failure: Secondary | ICD-10-CM

## 2022-12-12 MED ORDER — ASPIRIN 81 MG PO TBEC
81 mg | ORAL_TABLET | Freq: Every day | ORAL | 3 refills | 90.00000 days | Status: AC
Start: 2022-12-12 — End: ?
  Filled 2022-12-13 (×2): qty 90, 90d supply, fill #0

## 2022-12-12 NOTE — Telephone Encounter
Called and left a VM for pt req a CB   Will offer a f/u appt w/ Dr. Hermenia Fiscal on 10/21 at 11:30am (on hold)     [facility]: please confirm the appt time above with the pt and document so I can schedule the appt. Thank you!

## 2022-12-12 NOTE — Consults
CARDIOLOGY NOTE      PATIENT: [name]   MRN: [id number]   DOB: [birth year]   DATE OF SERVICE: 12/12/2022    PCP:  York Ram., MD      Problem List Items Addressed This Visit       Aortic calcification (HCC/RAF)     Other Visit Diagnoses       Essential hypertension    -  Primary    Systolic CHF, chronic (HCC/RAF)                  Interval History:     12/12/2022:   Doing well, no complaints    08/2022:  The patient feels SOB/dyspnea and tired  She notes chest pain and fibromyalgia   Ambulates with a walker  ET: 1/2 block, stable     Prior Encounter:     This is a 87 y.o. female who is here for cardiac evaluation.  Indication:   SOB    The patient is a/w friend, Jacobo Forest    Pt was recently hospitalized with SOB and chest pain.   Pt ambulates with a walker for precautionary reasons given history of falls.   With activity, the patient reports SOB/dypnea. She walks the dog 1/2 block  Her symptoms have been ongoing for the past 3 years, progressive worsening.   The patient notes allergies and asthma.   About 1 year ago, she could walk 1.5 blocks         Past Medical History:   Diagnosis Date    Breast cancer (HCC/RAF) 12/20/2018       No past surgical history on file.    Allergies:   Allergies   Allergen Reactions    Beta Adrenergic Blockers Other (See Comments)     Avoid d/t Myasthenia Gravis  Avoid d/t Myasthenia Gravis      Levofloxacin Other (See Comments)     Flare of Myasthenia Gravis    Macrolides And Ketolides Other (See Comments)     Avoid d/t Myasthenia Gravis  Use with caution d/t Myasthenia Gravis  Use with caution d/t Myasthenia Gravis      Sulfa Antibiotics Other (See Comments)     May possibly have caused deafness in one ear  May possibly have caused deafness in one ear  other      Botulinum Toxins Other (See Comments)     Muscle weakness r/t Myasthenia Gravis  Muscle weakness r/t Myasthenia Gravis      Pollen Extract Wheezing    Statins Other (See Comments)     Muscle aches  Muscle aches  Muscle aches      Plastic Tape Itching and Other (See Comments)     Turns the skin red where applied       Social History     Socioeconomic History    Marital status: Widowed   Tobacco Use    Smoking status: Former     Current packs/day: 0.00     Types: Cigarettes     Quit date: 1985     Years since quitting: 39.8    Smokeless tobacco: Former   Substance and Sexual Activity    Alcohol use: Not Currently    Drug use: Never     Social Determinants of Health     Financial Resource Strain: Low Risk  (04/12/2022)    Financial Resource Strain     Difficulty of Paying Living Expenses: Not hard at all       No family history  on file.  Adopted     Objective:     BP 117/61  ~ Pulse 91  ~ Wt 137 lb 3.2 oz (62.2 kg)  ~ SpO2 95%  ~ BMI 22.83 kg/m?     Vitals:    12/12/22 1406   BP: 117/61   Pulse: 91   SpO2: 95%   Weight: 137 lb 3.2 oz (62.2 kg)       BP Readings from Last 3 Encounters:   12/12/22 117/61   12/11/22 137/71   10/24/22 127/68       Body mass index is 22.83 kg/m?Marland Kitchen    Wt Readings from Last 3 Encounters:   12/12/22 137 lb 3.2 oz (62.2 kg)   12/11/22 136 lb (61.7 kg)   10/24/22 137 lb (62.1 kg)          General Exam: no apparent distress.    Neurologic/Psychiatric: alert and oriented. Affect is appropriate.  HEENT: Sclerae are anicteric. There is no epistaxis.  Cardiac: regular in rate and rhythm.    Respiratory: non-labored breathing. No use of accessory muscles. No wheeze.  Abdomen: bowel sounds are present. Abdomen is soft. No rebound.    Extremities: no edema. No cyanosis or clubbing.    Integument: no petechiae. No acute rash.         Laboratory:  Lab Results   Component Value Date    NA 140 12/11/2022    K 5.0 12/11/2022    CL 106 12/11/2022    CO2 26 12/11/2022    BUN 30 (H) 12/11/2022    CREAT 1.65 (H) 12/11/2022    GLUCOSE 92 12/11/2022    CALCIUM 9.7 12/11/2022     Lab Results   Component Value Date    CKTOT 51 12/11/2022    CKTOT 75 07/05/2022    CKTOT 54 01/07/2022    TROPONIN 0.05 (H) 12/19/2020    TROPONIN 0.07 (H) 12/19/2020    TROPONIN 0.06 (H) 12/18/2020     Lab Results   Component Value Date    BNP 156 (H) 06/03/2021     Lab Results   Component Value Date    WBC 5.23 12/11/2022    HGB 13.5 12/11/2022    HCT 40.5 12/11/2022    MCV 95.7 12/11/2022    PLT 291 12/11/2022     Lab Results   Component Value Date    CHOL 205 07/05/2022    CHOLHDL 60 07/05/2022    CHOLDLQ 135 (H) 07/05/2022    TRIGLY 77 07/05/2022     Lab Results   Component Value Date    PT 12.9 12/18/2020    INR 1.0 12/18/2020     No components found for: ''HA1C''  Lab Results   Component Value Date    TSH 1.5 12/11/2022       Patient Active Problem List   Diagnosis    Acute medial meniscal tear, left, subsequent encounter    Angioedema    Anxiety    Asthmatic bronchitis, mild persistent, with acute exacerbation    Chronic cholecystitis    Chronic interstitial cystitis    CKD (chronic kidney disease), stage III (HCC/RAF)    COPD mixed type (HCC/RAF)    Major depression, recurrent, chronic (HCC/RAF)    Diabetes mellitus (HCC/RAF)    Esophageal reflux    Eustachian tube dysfunction    Fibromyalgia    H/O arthroscopy    Insomnia    Myasthenia gravis with exacerbation (HCC/RAF)    Nocturnal hypoxemia  Osteoarthritis of knee    Renal cyst    Seasonal and perennial allergic rhinitis    Spondylosis without myelopathy or radiculopathy, lumbar region    Urethral caruncle    Vaginal atrophy    Benzodiazepine dependence (HCC/RAF)    Breast cancer (HCC/RAF)    Refractive error    Ptosis of eyelid    Pseudophakia of both eyes    Aortic calcification (HCC/RAF)    HTN (hypertension)    Immunosuppressed status (HCC/RAF)    Acute chest pain    Atypical chest pain    Heart failure with reduced ejection fraction LVEF <=40% (HCC/RAF)    Epiretinal membrane, bilateral    PVD (posterior vitreous detachment), bilateral    Chorioretinal scars, bilateral       Outpatient Medications Prior to Visit   Medication Sig    Albuterol Sulfate AEPB Takes 1 to 2 puffs every 4 hours as needed for wheezing or SOB Inhale Takes 1 to 2 puffs every 4 hours as needed for wheezing or SOB ..    amoxicillin-clavulanate 500-125 mg tablet Take 1 tablet by mouth two (2) times daily.    azaTHIOprine 50 mg tablet Take one and one-half tablet (75 mg total) by mouth two (2) times daily.    buPROPion, SR, (WELLBUTRIN SR) 200 mg 12 hr tablet Take 1 tablet (200 mg total) by mouth two (2) times daily.    CLONAZEPAM 1 mg tablet take 1 tablet by mouth at night    dapagliflozin 10 mg tablet Take 1 tablet (10 mg total) by mouth daily.    DEXILANT 60 MG DR capsule TAKE 1 CAPSULE (60 MG TOTAL) BY MOUTH DAILY    DULOXETINE 20 mg DR capsule TAKE 3 CAPSULES (60 MG TOTAL) BY MOUTH DAILY    EPINEPHrine 0.3 mg/0.3 mL auto-injector     Evolocumab (REPATHA SURECLICK) 140 MG/ML SOAJ Inject 1 pen. under the skin every fourteen (14) days.    exemestane 25 mg tablet Take 1 tablet (25 mg total) by mouth daily.    fluticasone-vilanterol (BREO ELLIPTA) 100-25 mcg/inh inhaler Inhale 1 puff daily.    GAMMAKED 5 GM/50ML infusion     losartan 100 mg tablet Take 1 tablet (100 mg total) by mouth daily.    METHENAMINE HIPPURATE 1 g tablet take one tablet by mouth two times a day    montelukast 10 mg tablet Take 1 tablet (10 mg total) by mouth as needed for.    predniSONE 20 mg tablet One daily for 5 days.    PYRIDOSTIGMINE 60 mg tablet TAKE 1 TABLET (60 MG TOTAL) BY MOUTH THREE (3) TIMES DAILY AS NEEDED    trimethoprim 100 mg tablet Take 1 tablet (100 mg total) by mouth daily.     No facility-administered medications prior to visit.       Current Outpatient Medications   Medication Sig    Albuterol Sulfate AEPB Takes 1 to 2 puffs every 4 hours as needed for wheezing or SOB Inhale Takes 1 to 2 puffs every 4 hours as needed for wheezing or SOB ..    amoxicillin-clavulanate 500-125 mg tablet Take 1 tablet by mouth two (2) times daily.    azaTHIOprine 50 mg tablet Take one and one-half tablet (75 mg total) by mouth two (2) times daily.    buPROPion, SR, (WELLBUTRIN SR) 200 mg 12 hr tablet Take 1 tablet (200 mg total) by mouth two (2) times daily.    CLONAZEPAM 1 mg tablet take 1 tablet by mouth  at night    dapagliflozin 10 mg tablet Take 1 tablet (10 mg total) by mouth daily.    DEXILANT 60 MG DR capsule TAKE 1 CAPSULE (60 MG TOTAL) BY MOUTH DAILY    DULOXETINE 20 mg DR capsule TAKE 3 CAPSULES (60 MG TOTAL) BY MOUTH DAILY    EPINEPHrine 0.3 mg/0.3 mL auto-injector     Evolocumab (REPATHA SURECLICK) 140 MG/ML SOAJ Inject 1 pen. under the skin every fourteen (14) days.    exemestane 25 mg tablet Take 1 tablet (25 mg total) by mouth daily.    fluticasone-vilanterol (BREO ELLIPTA) 100-25 mcg/inh inhaler Inhale 1 puff daily.    GAMMAKED 5 GM/50ML infusion     losartan 100 mg tablet Take 1 tablet (100 mg total) by mouth daily.    METHENAMINE HIPPURATE 1 g tablet take one tablet by mouth two times a day    montelukast 10 mg tablet Take 1 tablet (10 mg total) by mouth as needed for.    predniSONE 20 mg tablet One daily for 5 days.    PYRIDOSTIGMINE 60 mg tablet TAKE 1 TABLET (60 MG TOTAL) BY MOUTH THREE (3) TIMES DAILY AS NEEDED    trimethoprim 100 mg tablet Take 1 tablet (100 mg total) by mouth daily.    aspirin 81 mg EC tablet Take 1 tablet (81 mg total) by mouth daily.     No current facility-administered medications for this visit.             ACC/AHA Cholesterol Management Guideline Recommendations:   To continue statin treatment, as indicated based on pre-treatment risk assessment.      ASCVD Risk Score    10-year ASCVD risk  N/A. Age > 75. as of 2:20 PM on 12/12/2022.   10-year ASCVD risk with optimal risk factors  cannot be calculated.   Values used to calculate ASCVD Risk Score    Age  21 y.o. Cannot calculate risk because age is not between 42 and 78 years old.   Gender  female   Race  White   HDL Cholesterol  60 mg/dL. (measured on 07/05/2022)   LDL Cholesterol  135 mg/dL. (measured on 07/05/2022)   Total Cholesterol  205 mg/dL. (measured on 07/05/2022)   Systolic Blood Pressure  117 mm Hg. (measured on 12/12/2022)   Blood Pressure Medication Present  Yes   Smoking Status  currently not a smoker   Diabetes Present  Yes     Click here for the [facility] ASCVD Cardiovascular Risk Estimator Plus tool Office manager).           Assessment:     Michelee Ruffing is 87 y.o. female with    Cardiomyopathy, EF 35-40%. Echo in 2023   (G2211 - responsible for the longitudinal management of complex or chronic conditions that require ongoing care and coordination).      - avoiding BBlockers due to myasthenia gravis    Abnormal Myoview in 2023: There is a moderate-severe perfusion defect in the distal anteroseptal wall and apex, suggestive for MI.    SOB/dyspnea. Uses oxygen concentrator  Abnormal PFTs, h/o COPD   Abnormal EKG with LBBB and PACs  Former tobacco use  Subtle calcifications of the aortic root. Moderately calcified thoracic aorta and its main branch vessels.  Intolerant to Statins    Plan:     Losartan 100 mg  ASA    Repatha - discussed compliance   Roderic Palau, [name], MD  Clelia Croft, MD  Hi Manie Bealer.   She and I had a thoughtful discussion today.  In light of her breast cancer and her Myasthenia Gravis, it seems like medical management of her CAD is best.         Orders Placed This Encounter    Lipid Panel    Chol,LDL,Quant    ECG 12-Lead Clinic Performed    Echo adult transthoracic complete    aspirin 81 mg EC tablet           Return in about 3 months (around 03/14/2023).      GDMT for Heart Failure with Reduced Ejection Fraction   Most recent LVEF: 47.5%, HFrEF present on problem list: Yes.   For the most accurate recommendations, please ensure that a LVEF is present and HFrEF is added to the problem list, if applicable      GDMT Medication Class Current Prescription Recommendation   Specified Beta Blockers^ Not applicable Patient has a listed allergy to beta blockers   ACEi, ARB, ARNi^^ Losartan Patient is on ARNI (sacubitril-valsartan). Use with caution in patients with hyperkalemia and renail impairment.   MRA Not applicable Patient has a current contraindication or exclusion for MRA. Use with caution in patients with hyperkalemia and renal impairment   SGLT2i Dapagliflozin Patient is on SGLT2i. Use with caution in patients with renal impairment.   ^ Specified beta blockers: metoprolol succinate, carvedilol, bisoprolol   Marland Kitchen ARNI (sacubitril/valsartan) preferred. Criteria for ARNI initiation: Discontinuation of ACEI for at least 36 hours, eGFR>=30, K<=5.2, no history of angioedema   Click here for ACC / AHA HFrEF GDMT recommendations         There are no Patient Instructions on file for this visit.    Future Appointments   Date Time Provider Department Center   12/25/2022  1:40 PM Fredda Hammed., MD HEM/ONC [address] [facility]   01/30/2023  2:00 PM Dellie Burns, MD [facility] [address] [place]/Cen   02/07/2023  2:00 PM York Ram., MD GERI IMS [address] [place]/Cen   07/02/2023 11:30 AM Luis Abed., OD DS VIS REHAB [place]/Cen         Author: Clelia Croft, MD

## 2022-12-18 ENCOUNTER — Ambulatory Visit: Payer: BLUE CROSS/BLUE SHIELD

## 2022-12-20 MED ORDER — EXEMESTANE 25 MG PO TABS
25 mg | ORAL_TABLET | Freq: Every day | ORAL | 1 refills | Status: AC
Start: 2022-12-20 — End: ?

## 2022-12-21 ENCOUNTER — Telehealth: Payer: BLUE CROSS/BLUE SHIELD

## 2022-12-21 NOTE — Telephone Encounter
Called and spoke with pt. Offered a sooner appt w/ Dr. Hermenia Fiscal   Pt is unable to do video appts on her phone and requested a telephone call appt  Telephone appt scheduled on 10/28 at 9 am

## 2022-12-21 NOTE — Telephone Encounter
Call Back Request      Reason for call back: Patient calling to speak to doctor. States the infusion, GAMMAKED is not working, her legs are getting worse with pain and weakness and would like to talk to him about changing the medicine.    Please advise, thank you.    CBN [id number]    Any Symptoms:  []  Yes  [x]  No      If yes, what symptoms are you experiencing:    Duration of symptoms (how long):    Have you taken medication for symptoms (OTC or Rx):      If call was taken outside of clinic hours:    [] Patient or caller has been notified that this message was sent outside of normal clinic hours.     [] Patient or caller has been warm transferred to the physician's answering service. If applicable, patient or caller informed to please call us back if symptoms progress.  Patient or caller has been notified of the turnaround time of 1-2 business day(s).

## 2022-12-24 ENCOUNTER — Telehealth: Payer: BLUE CROSS/BLUE SHIELD | Attending: Student in an Organized Health Care Education/Training Program

## 2022-12-24 NOTE — Telephone Encounter
Message to Practice/Provider      Message: Patient called regarding appointment. Patient missed MD phone calls. Rescheduled for next available appointment. Patient informed she would like to change infusion medication due to multiple issues she's had with current treatment that she did not have with previous treatment. Thank you.     Patient or caller has been notified that their message will be reviewed within 1-2 business days. '

## 2022-12-24 NOTE — Telephone Encounter
Called and spoke with pt. Appt has been changed to 10/31 at 3 pm

## 2022-12-25 ENCOUNTER — Ambulatory Visit: Payer: BLUE CROSS/BLUE SHIELD | Attending: Hematology & Oncology

## 2022-12-25 DIAGNOSIS — M810 Age-related osteoporosis without current pathological fracture: Secondary | ICD-10-CM

## 2022-12-25 NOTE — Patient Instructions
Mammogram / [place] - due 03/2023    DEXA - due anytime now     Please call [phone] to schedule.

## 2022-12-27 ENCOUNTER — Telehealth: Payer: BLUE CROSS/BLUE SHIELD | Attending: Student in an Organized Health Care Education/Training Program

## 2022-12-27 DIAGNOSIS — G7000 Myasthenia gravis without (acute) exacerbation: Secondary | ICD-10-CM

## 2022-12-27 NOTE — Progress Notes
NEUROMUSCULAR CLINIC NOTE    PATIENT:  [name]  MRN:  [id number]  DOB:  1935/06/15  DATE OF SERVICE:  12/27/2022    REFERRING PHYSICIAN: Dellie Burns, MD  PRIMARY CARE PHYSICIAN: York Ram., MD  REASON FOR VISIT: Myasthenia gravis    History of Present Illness:     [name] is a 87 y.o. 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 She will see me in person on 6/26 but we arranged a sooner telephone visit since she is worried about worsening symptoms. She states that she did not receive IVIG since January and has been feeling gradual worsening of myasthenia symptoms. She has been stumbling and feels her balance is off. She noted slight worsening of droopy eyelids. She is also experiencing blurry vision and double vision. She has no swallowing difficulty and usually does not have shortness of breath, except for a single recent episode that resolved with COPD medications.     She was diagnosed 8 years ago. At the time she was having significant double vision. She initially had ocular symptoms only, then over time developed generalized myasthenia symptoms. She had one acute exacerbation that required hospitalization and plasma exchange treatment. She was previously treated with combination of IVIG, azathioprine, and Mestinon. IVIG dosing was every 2 weeks for a long time but was adjusted to every 3 weeks in 2023. She has been off of IVIG since the beginning of 2024 for the past 6 months now. She remains on azathioprine. She is not on pyridostigmine as far as she can tell.    Interval history 08/22/2022:  Previous visit was a telephone visit for me to review if restarting IVIG is appropriate. The order was approved and she is due for the initial loading dose of IVIG later this week. She continues to take azathioprine 75 mg BID. She also has a prescription for Mestinon 60 mg as needed up to 3 times a day. She is not sure if she is taking it because the medications are managed by the caregiver. She is either taking it on a regular schedule or not taking at all.    Currently she has no double vision, although this was a problem in the past. She feels her vision is a little blurry. She notices right sided ptosis when she is tired. Sometimes her daughter notices that she is slurring her words but she does not notice a difference herself. She is used to cutting solid food into very small pieces or eating soft foods. She feels chewing has been harder since she stopped getting IVIG. She has mild difficulty swallowing, which to her feels like her esophagus is smaller than it should be. She does not choke. Her chief complaint is feeling weak and tired. She is frequently feeling very tired. She is unsure how much of this is due to COPD. She gets shortness of breath easily with exertion. She has difficulty using her arms when she is tired. She feels legs are constantly a little weak. She always has to use her arms to get up from sitting position. She has been using a walker for the past 2-3 months. She denies any falls. She also complains of significant hand tremor that has been gradually getting worse. She notices this with fine tasks such as stitching. The tremor does not interfere with daily activities such as eating.    Interval history 10/24/2022:  She had an episode of bronchitis and was treated with prednisone. During this she was told she should not get IVIG and she missed the last dose of IVIG (It has been 6 weeks). She is scheduled to get it this week. She still feels tired all the time. A new complaint is double vision that she has noticed on a few occasions. This does not bother her at home but bothers her a lot when outside. She feels her legs are weaker. She feels this is because of she missed the dose of IVIG. She is taking Mestinon 60 mg 3 times a day on schedule. She complains about the hand tremor today as well. This only bothers her when she is doing something intricate and she still does not want to take another medication.    Interval history 12/27/2022:  Patient reports worsening symptoms. She attributes this to change in immunoglobulin brand from [employer] to [place]. She has been experiencing significantly more weakness in her lower extremities. She has difficulty standing up for too long. She is also complaining of lower back pain. She feels double vision is also worse and she has less energy. She is afraid of falling.     Myasthenia Gravis Activities of Daily Living (MG-ADL)  None = 0  Mild = 1  Moderate = 2  Severe = 3     Talking:  None = 0: Normal  Mild = 1  Moderate = 2: Constant slurring or nasal speech, but can be understood  Severe = 3: Difficult to understand speech     Chewing:  None = 0: Normal   Mild = 1: Fatigue with solid food  Moderate = 2: Fatigue with soft food  Severe = 3: Gastric tube     Swallowing:  None = 0: Normal  Mild = 1: Rare episode of choking  Moderate = 2: Frequent choking necessitating changes in diet  Severe = 3: Gastric tube     Breathing:  None = 0: Normal neeed  Mild = 1: Shortness of breath with exertion  Moderate = 2: Shortness of breath at rest  Severe = 3: Ventilator dependance     Impairment of ability to brush teeth or comb:  None = 0: None  Mild = 1: Extra effort, but no rest periods needed  Moderate = 2: Rest periods needed  Severe = 3: Cannot do one of these functions     Impairment of ability to arise from a chair:  None = 0: None  Mild = 1: Mild, sometimes uses arms  1  Moderate = 2: Moderate, always uses arms  Severe = 3: Severe, requires assistance     Double vision:  None = 0: None  Mild = 1: Occurs, but not daily  Moderate = 2: Daily, but not constant  Severe = 3: Constant     Eyelid droop  None = 0: None  Mild = 1: Occurs, but not daily  Moderate = 2: Daily, but not constant  Severe = 3: Constant Total: 8 on 10/24/2022 (previously 8 on 08/22/2022 and 7 on 08/15/2021)    Past Medical History:  Past Medical History:   Diagnosis Date    Breast cancer (HCC/RAF) 12/20/2018      Past Surgical History:  No past surgical history on file.    Social History:  Social History     Socioeconomic History    Marital status: Widowed   Tobacco Use    Smoking status: Former  Current packs/day: 0.00     Types: Cigarettes     Quit date: 29     Years since quitting: 39.8    Smokeless tobacco: Former   Substance and Sexual Activity    Alcohol use: Not Currently    Drug use: Never     Social Determinants of Health     Financial Resource Strain: Low Risk  (04/12/2022)    Financial Resource Strain     Difficulty of Paying Living Expenses: Not hard at all     Family History:  No family history on file.    Allergies:  Allergies   Allergen Reactions    Beta Adrenergic Blockers Other (See Comments)     Avoid d/t Myasthenia Gravis  Avoid d/t Myasthenia Gravis      Levofloxacin Other (See Comments)     Flare of Myasthenia Gravis    Macrolides And Ketolides Other (See Comments)     Avoid d/t Myasthenia Gravis  Use with caution d/t Myasthenia Gravis  Use with caution d/t Myasthenia Gravis      Sulfa Antibiotics Other (See Comments)     May possibly have caused deafness in one ear  May possibly have caused deafness in one ear  other      Botulinum Toxins Other (See Comments)     Muscle weakness r/t Myasthenia Gravis  Muscle weakness r/t Myasthenia Gravis      Pollen Extract Wheezing    Statins Other (See Comments)     Muscle aches  Muscle aches  Muscle aches      Plastic Tape Itching and Other (See Comments)     Turns the skin red where applied     Current Medications:  Current Outpatient Medications   Medication Instructions    Albuterol Sulfate AEPB Takes 1 to 2 puffs every 4 hours as needed for wheezing or SOB Inhale Takes 1 to 2 puffs every 4 hours as needed for wheezing or SOB .    amoxicillin-clavulanate 500-125 mg tablet 1 tablet, Oral, 2 times daily    aspirin 81 mg, Oral, Daily    azaTHIOprine 50 mg tablet Take one and one-half tablet (75 mg total) by mouth two (2) times daily.    buPROPion (SR) (WELLBUTRIN SR) 200 mg, Oral, 2 times daily    CLONAZEPAM 1 mg tablet take 1 tablet by mouth at night    dapagliflozin (FARXIGA) 10 mg, Oral, Daily    DEXILANT 60 mg, Oral, Daily    DULoxetine (CYMBALTA) 60 mg, Oral, Daily    EPINEPHrine 0.3 mg/0.3 mL auto-injector     Evolocumab (REPATHA SURECLICK) 140 MG/ML SOAJ 1 pen., Subcutaneous, Every 14 days    exemestane (AROMASIN) 25 mg, Oral, Daily    fluticasone-vilanterol (BREO ELLIPTA) 100-25 mcg/inh inhaler 1 puff, Inhalation, Daily    GAMMAKED 5 GM/50ML infusion     losartan (COZAAR) 100 mg, Oral, Daily    METHENAMINE HIPPURATE 1 g tablet take one tablet by mouth two times a day    montelukast (SINGULAIR) 10 mg, Oral, As needed for    predniSONE 20 mg tablet One daily for 5 days    PYRIDOSTIGMINE 60 mg tablet TAKE 1 TABLET (60 MG TOTAL) BY MOUTH THREE (3) TIMES DAILY AS NEEDED    trimethoprim (TRIMPEX) 100 mg, Oral, Daily     Review of Systems:  Complete review of systems otherwise negative, except as noted in HPI.    Physical Examination:     Not performed    Diagnostic Studies:  Labs:   08/26/2015: AChR binding (0.63) blocking (28) antibody, ESR 3, CRP 4.9     Imaging:  CT chest w/ contrast 12/08/2016: No evidence of thymoma    PFTs 05/23/2022:      EDX:  EMG/NCS 09/19/2020 with Dr. Claudie Leach:  Results:  1. Left median, left ulnar, left radial, right median, right ulnar, right radial and right superficial peroneal SNAPs were normal.  2. Left median, left ulnar, right median, right ulnar and right peroneal CMAPs were normal.  3. Left median and right median F-wave response latencies were normal.  4. Monopolar needle EMG of the right tibialis anterior was normal..  Impression:  This is a normal study. Specifically, there is no electrodiagnostic evidence of a neuropathy in this study.      Assessment: Ettel Sipos is a 87 y.o. 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 She presented to me with worsening MG symptoms after being off IVIG for 6 months. We restarted the treatments, however she again missed a dose because of bronchitis and only received 2 doses, last of it 6 weeks ago. In her previous visit she is mildly symptomatic from MG with proximal weakness and minimal bulbar symptoms although has a high MG-ADL, possibly related with other medical comorbidities, especially COPD. She called the office feeling worse in the past few months with worsening double vision and extremity weakness. She also sounds more dysarthric on the phone. I would like to change her treatment regimen to a complement inhibitor.     Plan:  - Continue azathioprine 75 mg BID  - Stop IVIG 1g/kg every 3 weeks  - Start Ultomiris  - I will reach out to PCP regarding pneumococcal vaccine  - Continue Mestinon 60 mg TID  - Return to clinic in 2 months, in person visit    Note: Greater than 60 minutes spent on this encounter in consultation and in coordination of care.    Dellie Burns, MD.  Assistant Professor  [facility] Department of Neurology  12/27/2022 3:29 PM

## 2023-01-02 ENCOUNTER — Ambulatory Visit: Payer: BLUE CROSS/BLUE SHIELD | Attending: Student in an Organized Health Care Education/Training Program

## 2023-01-14 ENCOUNTER — Ambulatory Visit: Payer: BLUE CROSS/BLUE SHIELD

## 2023-01-15 ENCOUNTER — Ambulatory Visit: Payer: BLUE CROSS/BLUE SHIELD

## 2023-01-15 DIAGNOSIS — Z7185 Vaccine counseling: Secondary | ICD-10-CM

## 2023-01-15 DIAGNOSIS — Z23 Encounter for immunization: Secondary | ICD-10-CM

## 2023-01-15 NOTE — Telephone Encounter
I spent a total of 11 minutes with this established patient cumulatively within the 7 days of the MyChart message date.   Topics of my discussion, including but not limited to evaluation/assessment/management of the patient complaint are noted in the MyChart message exchange(s).    Total time spent may include providing evaluation and advice, responding to the patient, documenting the encounter, placing orders, and reviewing relevant labs and/or prior notes, if applicable.     1. Need for meningococcus vaccine    2. Vaccine counseling        Orders Placed This Encounter    Meningococcal polysaccharide vaccine ([place]) SubQ Adults >= 56 years       York Ram  01/15/2023

## 2023-01-23 ENCOUNTER — Ambulatory Visit: Payer: BLUE CROSS/BLUE SHIELD | Attending: Geriatric Medicine

## 2023-01-23 DIAGNOSIS — Z23 Encounter for immunization: Secondary | ICD-10-CM

## 2023-01-23 MED ORDER — DEXILANT 60 MG PO CPDR
60 mg | ORAL_CAPSULE | Freq: Every day | ORAL | 3 refills | Status: AC
Start: 2023-01-23 — End: ?

## 2023-01-23 NOTE — Progress Notes
OUTPATIENT GERIATRICS CLINIC NOTE    PATIENT:  [name]   MRN:  [id number]  DOB:  [birth year]  DATE OF SERVICE:  01/23/2023  PRIMARY CARE PHYSICIAN: York Ram., MD    CHIEF COMPLAINT:   No chief complaint on file.      [facility]:  [employer]  [facility]--Neurology      [employer] [facility]:    Chaperone status:  No data recorded    HISTORY OF PRESENT ILLNESS     [name] is a 87 y.o. female who presents today for *follow up**. Patient is accompanied by *no one**. History today is per the patient,and review of available recent records in [facility] and Care Everywhere.     Patient is a(n) [x]  reliable []  unreliable historian and additional collateral information was obtained during the visit today from:         Feels ok            Per chart review, pertinent medical history:  Past Medical History:   Diagnosis Date    Breast cancer (HCC/RAF) 12/20/2018     No past surgical history on file.    ALLERGIES     Allergies   Allergen Reactions    Beta Adrenergic Blockers Other (See Comments)     Avoid d/t Myasthenia Gravis  Avoid d/t Myasthenia Gravis      Levofloxacin Other (See Comments)     Flare of Myasthenia Gravis    Macrolides And Ketolides Other (See Comments)     Avoid d/t Myasthenia Gravis  Use with caution d/t Myasthenia Gravis  Use with caution d/t Myasthenia Gravis      Sulfa Antibiotics Other (See Comments)     May possibly have caused deafness in one ear  May possibly have caused deafness in one ear  other      Botulinum Toxins Other (See Comments)     Muscle weakness r/t Myasthenia Gravis  Muscle weakness r/t Myasthenia Gravis      Pollen Extract Wheezing    Statins Other (See Comments)     Muscle aches  Muscle aches  Muscle aches      Plastic Tape Itching and Other (See Comments)     Turns the skin red where applied        MEDICATIONS     Personally reviewed.    No outpatient medications have been marked as taking for the 01/23/23 encounter (Appointment) with York Ram., MD.       SOCIAL HISTORY     Social History     Social History Narrative    Not on file        FUNCTIONAL STATUS     BADLs:  IADLs:    Ambulates with   Falls in past year:  Afraid of falling:    GERIATRIC REVIEW OF SYSTEMS     Vision:    []   glasses     []  legally blind  Hearing: []  hearing aids    Nutrition: []  normal   []  impaired  []  vegan  []  vegetarian  []  low salt  []  low-carb   Swallowing: []  impaired   Dentures:   []  yes    Depression:  []  yes   Cognition:     Incontinence: [] urine  []  fecal  []  urine and fecal      ADVANCED CARE PLANNING     Advanced directives on file: []  No  []   Yes ? Completed:     Medical DPOA on file:   []   Yes ?   []   No ? Patient designates ** * to be their surrogate medical decision-maker.         HEALTH CARE MAINTENANCE     IMMUNIZATIONS:   Immunization History   Administered Date(s) Administered    COVID-19 [employer] Fall 2023 12y and up) PF, 30 mcg/0.3 mL 03/27/2022    COVID-19, mRNA, (Pfizer - Purple Cap) 30 mcg/0.3 mL 03/11/2019, 04/01/2019, 10/29/2019    COVID-19, mRNA, bivalent (Pfizer) 30 mcg/0.3 mL (12y and up) 11/25/2020, 07/05/2021    COVID-19, mRNA, tris-sucrose (Pfizer - [employer]) 30 mcg/0.3 mL 06/20/2020    DTaP 12/31/2015    influenza vaccine IM quadrivalent (Fluzone Quad) MDV (34 months of age and older) 11/26/2016    influenza vaccine IM quadrivalent adjuvanted (FluAD Quad) (PF) SYR (4 years of age and older) 11/05/2018, 12/01/2019, 11/25/2020    influenza vaccine IM quadrivalent high dose (Fluzone High Dose Quad) (PF) SYR (61 years of age and older) 11/22/2021    influenza vaccine IM trivalent high dose (Fluzone High Dose) (PF) SYR (60 years of age and older) 12/11/2022    influenza, unspecified formulation 05/10/2014, 12/29/2015, 11/26/2016, 04/01/2019, 04/01/2019, 12/01/2019, 12/01/2019, 12/01/2019, 12/01/2019, 11/25/2020, 11/25/2020, 01/03/2022, 12/11/2022    pneumococcal conjugate vaccine 20-valent (Prevnar 20) 07/05/2021    pneumococcal polysaccharide vaccine 23-valent (Pneumovax) 01/10/2019       Mammogram:  DXA:  PAP:  Colonoscopy:      PHYSICAL EXAM     BP 131/77  ~ Pulse 95  ~ Temp 36.2 ?C (97.1 ?F) (Forehead)  ~ Resp 17  ~ Ht 5' 5'' (1.651 m)  ~ Wt 135 lb 14.4 oz (61.6 kg)  ~ SpO2 96%  ~ BMI 22.61 kg/m?   Wt Readings from Last 3 Encounters:   12/25/22 136 lb 6.4 oz (61.9 kg)   12/12/22 137 lb 3.2 oz (62.2 kg)   12/11/22 136 lb (61.7 kg)         System Check if normal Positive or additional negative findings   GEN  [x]  NAD     Eyes  []  Conj/Lids []  Pupils  []  Fundi   []  Sclerae []  EOM     ENT  []  External ears   []  Otoscopy   []  Gross Hearing    []   External nose   []  Nasal mucosa   []  Lips/teeth/gums    []  Oropharynx    []  Mucus membranes      Neck  [x]  Inspection/palpation    [x]  Thyroid     Resp  [x]  Effort    [x]  Auscultation       CV  [x]  Rhythm/rate   [x]  Murmurs   [x]  Edema   []  JVP non-elevated    Normal pulses:   []  Radial []  Femoral  []  Pedal     Breast  []  Inspection []  Palpation     GI  []  Bowel sounds    [x]  Nontender   [x]  No distension    []  No rebound or guarding   []  No masses   []  Liver/spleen    []  Rectal     GU  F:  []  External []  vaginal wall         []  Cervix     []  mucus        []  Uterus    []  Adnexa   M:  []  Scrotum []  Penis         []  Prostate     Lymph  []  Cervical []  supraclavicular     []   Axillae   []  Groin/inguinal     MSK []  Gait  []  Back     Specify site examined:    []  Inspect/palp []  ROM   []  Stability []  Strength/tone []  Used arms to push up from seated to standing position   Assistive device:  []  single point cane []  quad cane  []  FWW []  rollator walker      Skin  []  Inspection []  Palpation     Neuro  []  Alert and oriented     []  CN2-12 intact grossly   []  DTR      []  Muscle strength      []  Sensation   []  Pronator drift   []  Finger to Nose/Heel to Shin   []  Romberg     Psych  []  Insight/judgement     []  Mood/affect    []  Gross cognition            LABS/STUDIES     LABS:  Lab Results   Component Value Date    WBC 5.23 12/11/2022 WBC 5.15 09/25/2022    WBC 6.26 07/05/2022    HGB 13.5 12/11/2022    HGB 11.9 09/25/2022    HGB 13.6 07/05/2022    MCV 95.7 12/11/2022    PLT 291 12/11/2022    PLT 291 09/25/2022    PLT 335 07/05/2022     Lab Results   Component Value Date    NA 140 12/11/2022    NA 140 09/25/2022    NA 139 07/05/2022    K 5.0 12/11/2022    K 4.9 09/25/2022    K 4.5 07/05/2022    CREAT 1.65 (H) 12/11/2022    CREAT 1.78 (H) 09/25/2022    CREAT 1.56 (H) 07/05/2022    GFRESTNOAA 29 06/09/2020    GFRESTNOAA 28 05/18/2020    GFRESTNOAA 30 03/23/2020    GFRESTAA 34 06/09/2020    GFRESTAA 33 05/18/2020    GFRESTAA 35 03/23/2020    CALCIUM 9.7 12/11/2022    CALCIUM 9.7 09/25/2022    CALCIUM 9.8 07/05/2022     Lab Results   Component Value Date    ALT 10 12/11/2022    ALT 13 09/25/2022    AST 25 12/11/2022    AST 26 09/25/2022    ALKPHOS 74 12/11/2022    BILITOT 0.4 12/11/2022    ALBUMIN 3.8 (L) 12/11/2022    ALBUMIN 4.0 09/25/2022     Lab Results   Component Value Date    TSH 1.5 12/11/2022    TSH 1.6 07/05/2022    TSH 2.3 01/03/2022     Lab Results   Component Value Date    HGBA1C 5.9 (H) 07/05/2022    HGBA1C 5.3 05/03/2022    HGBA1C 5.2 09/01/2021     Lab Results   Component Value Date    CHOL 160 12/11/2022    CHOL 205 07/05/2022    CHOL 226 01/07/2022     Lab Results   Component Value Date    CHOLDLQ 76 12/11/2022    CHOLDLQ 135 (H) 07/05/2022    CHOLDLQ 144 (H) 01/07/2022     Lab Results   Component Value Date    VITD25OH 76 10/18/2021     Lab Results   Component Value Date    FE 87 07/14/2019    FERRITIN 57 07/14/2019    FOLATE 9.6 11/25/2019    TIBC 356 07/14/2019     Lab Results   Component Value Date    VITAMINB12 3,994 (  H) 11/25/2019    VITAMINB12 217 (L) 09/22/2019     Lab Results   Component Value Date    BNP 156 (H) 06/03/2021    BNP 92 09/22/2019     No results found for: ''PSATOTAL''      STUDIES:      XR CHEST PA LAT 2V  September 22, 2019   COMPARISON: [facility]UB December 17, 2018     History: sob     FINDINGS:     Lungs: Clear  Heart/aorta: Normal heart size. The thoracic aorta is mildly calcified, tortuous and ectatic.  Adenopathy: None  Pleura: No effusion  Bones and Chest wall: No acute bony or body wall  findings. Osteopenia and bony maturational changes. Right upper quadrant cholecystectomy clips stable from October 2020        IMPRESSION:     No acute findings or abnormalities related to provided history.      MRI C spine  May 03, 2021  IMPRESSION:  Redemonstrated congenital cervical spinal canal stenosis with superimposed multilevel degenerative changes, which are mildly progressive compared to prior as above. No evidence of cord compression or cord signal abnormality.      Echo  Jan 2023  1. Normal left ventricular size.  2. There are LV regional wall motion abnormalities.  3. Left ventricular ejection fraction is approximately 35 to 40%.  4. Abnormal LV diastolic function.  5. There are no prior studies on this patient for comparison purposes.  ASSESSMENT and PLAN     [name] is a 87 y.o. female who presents today for *follow up**.    #Major Depression--active--self d/ced duloxetine.  Rechallenge.  Tolerating Duloxetine.  #Myesthenia Gravis--worse. Got first dose of MenB today.  Will message Dr. Hermenia Fiscal to see if she needs the second dose before starting ULTOMIRIS .  #HFrEF--EF 35-40%noted on echo-- 2023--Not Worsening.  Chronic.  Will monitor and treat underlying risk factors with medical and lifestyle interventions as warranted by balance between benefits and burdens.   #CAD--discussed medical management alone with shared decision making model May 11, 2021.  Declines staint  #Cervical myelopathy--monitored by neuro--chronic--re-imaged on MRI as above.  CTM.  #HTN--Increased nifedipine CR 60 mg daily. Continue losartan 50 mg.  BP Better.    #CKD--stage 4--chronic--CTM--control BP as tolerated by symptoms of orthostasis and fatigue.  Trial off trimethoprim.  REcheck Cr now and in two weeks.  #CAD--patient leaning towards medical management.  #Aortic Calcification--noted on chest imaging September 22, 2019--Not Worsening.  Chronic.  Will monitor and treat underlying risk factors with medical and lifestyle interventions as warranted by balance between benefits and burdens.  #Back pain--to refer to Pain for discussion of epidural injection.  #allergic rhinitis--trial flonase  #Macrocytosis--  #Deconditioning--initiate home PT  #GERD--s/p Nisan fundoplication  #COPD--mixed--per PFT's from [name].--albuterol prn.  May resume other inhalders.  #s/p rectocele repair  #s/p cholecystectomy  #Immunosupressed status--medication related.  Chronic. CTM  #Nocturnal hypoxemia--presumably related to MG crisis but has history of COPD.  Will order nocturnal home O2 study and consider PFT's/Pulmonary evaluation once settled down.  May need to restart BREO inhaler.  #Myasthenia Gravis--s/p plasmapheresis x5 October 2020, no Thymoma, on Azathioprine 150 daily and mestinon 60 tid.  Saw Dr. Enedina Finner at [facility].  Was considering Soliris.  Ordering IVIG and MRI.  Encourage ongoing neurology follow up.   #IBS--titrate miralax to comfort.    #Interstitial cystitis--stopping daily Trimethoprim 100 for UTI prophylaxis.  Using bladder instillations prn. OFF topical estrogen due to advice from  neurologist re: MG.  To see Dr. Lorenz Coaster.  Suspect recent flare related to breast CA re-diagnosis.  Doubt pseudomonas bactiuria is pathogen at present.  WIll complete current meropenum.  #Allergic rhinitis--previously on nasal steroids.  #Breast CA--under care.  Patient continues to decline surgery.  Wondering about additional treatment options.  Will direct to med-onc for discussion.  #DM with CKD--stabe 3b--based on labs from [name].  FOllow here.  If BP permits, may consider ARB BUT patient with h/o angioedema so will ONLY initiate this agent with great caution.  Refer to Ophthalmology.  #OA knees--previously getting hyaluronic acid injections.  #Anxiety  #Benzodiazepine Dependence--uncomplicated--chronic.  Encourage patient to decrease intake--ideally to abstaining but will work towards this goal over time.  Not likely to achieve this given ongoing concerns re: breast ca.  #Fibromyalgia--duloxetine as above.   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  #Statin intolerance      FOLLOW-UP     RTC     Future Appointments   Date Time Provider Department Center   01/23/2023 11:40 AM York Ram., MD GERI IMS [address]   01/30/2023  2:00 PM Dellie Burns, MD [facility] [address]/C[facility]   03/27/2023  1:00 PM SM 2020 ECHO 03 CARDIOLOGY CARDIAC IMAGING CI SM2020 [facility]   03/27/2023  1:45 PM 2020 2ND FLOOR, LAB PATH BLD2020 [facility]   03/29/2023  1:30 PM Clelia Croft, MD CRD DIS 2020 [facility] [place]   07/02/2023 11:30 AM Luis Abed., OD DS VIS REHAB [address]   07/24/2023 11:40 AM Fredda Hammed., MD HEM/ONC [address]         The above plan of care, diagnosis, orders, and follow-up were discussed with the patient and/or surrogate. Questions related to this recommended plan of care were answered.    ANALYSIS OF DATA (Needs to meet 1 category for moderate and 2 categories for high LOS)     I have:     Category 1 (Needs 3 for moderate and high LOS)     []  Reviewed []  1 []  2 []  >= 3 unique laboratory, radiology, and/or diagnostic tests noted below    Test/Study:  on date .    []  Reviewed []  1 []  2 []  >= 3 prior external notes and incorporated into patient assessment    I reviewed Dr. 's note in specialty  from date .    []  Discussed management or test interpretation with external provider(s) as noted      []  Ordered []  1 []  2 []  >= 3 unique laboratory, radiology, and/or diagnostic tests noted in A&P    []  Obtained history from independent historian:       Category 2  []  Independently interpreted the test    Category 3  []  Discussed management or test interpretation with external provider(s) as noted      INTERACTION COMPLEXITY and SOCIAL DETERMINANTS of HEALTH       PROBLEM COMPLEXITY   []  New problem with uncertain diagnosis or prognosis (moderate)   []  Multiple stable chronic problems (moderate)   []  Chronic problem not stable - not controlled, symptomatic, or worsening (moderate)   []  Severe exacerbation of chronic problem (high)   []  New or chronic problem that poses threat to life or bodily function (high)     MANAGEMENT COMPLEXITY   []  Old or external/outside records reviewed   []  Discussion with alternate (proxy) if patient with impaired communication / comprehension ability (e.g., dementia, aphasia, severe hearing loss).   []   Repeated questions (or disagreement) between patient and/among caregivers/family during the visit.  []  Caregiver/patient emotions/behavior/beliefs interfering with implementation of treatment plan.  []  Independent interpretation of test (EKG, Chest XRay)   []  Discussion of case with a consultant physician     RISK LEVEL   []  Prescription drug management (moderate)   []  Minor surgery with CV risk factors or elective major surgery (moderate)   []  Dx or Rx significantly limited by SDoH (inadequate housing, living alone, poor health care access, inappropriate diet; low literacy) (moderate)   []  Major surgery - elective with CV risk factors or emergent (high)   []  Need for hospitalization (high)   []  New DNR or de-escalation of care (high)      SDoH  The diagnosis or treatment of said conditions is significantly limited by the following social determinants of health:  []  Z59.0 Homelessness  []  Z59.1 Inadequate housing  []  Z59.2 Discord with neighbors, lodgers and landlord  []  Z60.2 Problems related to living alone  []  Z59.8 Other problems related to housing and economic circumstances  []  Z59.4 Lack of adequate food and safe drinking water  []  Z59.6 Low income  []  Z59.7 Insufficient social insurance and welfare support  []  Z59.9 Problems related to housing and economic circumstances, unspecified  []  Z75.3 Unavailability and inaccessibility of health care facilities  []  Z75.4 Unavailability and inaccessibility of other helping agencies  []  Z72.4 Inappropriate diet and eating habits  []  Z62.820 Parent-biological child conflict  []  Z63.8 Other specified problems related to primary support group  []  Z55.0 Illiteracy and low level literacy   []  Z56.9 Unspecified problems related to employment        If Billing Based on Time:     I performed the following items on the day of service:    [x]  Preparing to see the patient (e.g., review of tests)  [x]  Obtaining and/or reviewing separately obtained history   [x]  Performing a medically appropriate examination and/or evaluation   [x]  Counseling and educating the patient/family/caregiver   [x]  Ordering medications, tests, or procedures  [x]  Referring and communicating with other healthcare professionals (when not separately reported)  [x]  Documenting clinical information in the EHR  []  Independently interpreting results and communicating results to patient/family/caregiver    I spent the following total amount of time on these tasks on the day of service:  New Patient     Established Patient  []  15-29 minutes - 99202    []  up to 9 minutes - 99211  []  30-44 minutes - 99203     []  10-19 minutes - 99212   []  45-59 minutes - 99204      []  20-29 minutes - 99213   []  60-74 minutes - 99205   []  30- minutes - 99214         [x]  40-55 minutes - 99215    []  I spent an additional ** * 15-minute-increment(s) for a total of * ** minutes on these tasks on the day of service. [phone] for each additional 15 minutes.)    Author: York Ram, MD 01/23/2023    [phone] billed today:    I have a longitudinal care relationship with this patient and am the focal point of ongoing patient care related to the serious chronic and complex condition(s) assessed today as noted above in the note.   I assume responsibility of ongoing management of said condition(s) over time.

## 2023-01-29 ENCOUNTER — Telehealth: Payer: BLUE CROSS/BLUE SHIELD

## 2023-01-29 NOTE — Telephone Encounter
Message to Practice/Provider      Message:  Freida Busman From [facility] is requesting a fax copy of the following vaccine for records:    Please fax to [phone]      Meningococcal conjugate 4-valent (Menveo) IM (adult, age <= 85) (Order [id number])     Patient or caller has been notified that their message will be reviewed within 1-2 business days.

## 2023-01-30 ENCOUNTER — Ambulatory Visit: Payer: BLUE CROSS/BLUE SHIELD | Attending: Student in an Organized Health Care Education/Training Program

## 2023-01-30 NOTE — Progress Notes
NEUROMUSCULAR CLINIC NOTE    PATIENT:  [name]  MRN:  [id number]  DOB:  09-Aug-1935  DATE OF SERVICE:  01/30/2023    REFERRING PHYSICIAN: No ref. provider found  PRIMARY CARE PHYSICIAN: York Ram., MD  REASON FOR VISIT: Myasthenia gravis    History of Present Illness:     [name] is a 87 y.o. 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 She will see me in person on 6/26 but we arranged a sooner telephone visit since she is worried about worsening symptoms. She states that she did not receive IVIG since January and has been feeling gradual worsening of myasthenia symptoms. She has been stumbling and feels her balance is off. She noted slight worsening of droopy eyelids. She is also experiencing blurry vision and double vision. She has no swallowing difficulty and usually does not have shortness of breath, except for a single recent episode that resolved with COPD medications.     She was diagnosed 8 years ago. At the time she was having significant double vision. She initially had ocular symptoms only, then over time developed generalized myasthenia symptoms. She had one acute exacerbation that required hospitalization and plasma exchange treatment. She was previously treated with combination of IVIG, azathioprine, and Mestinon. IVIG dosing was every 2 weeks for a long time but was adjusted to every 3 weeks in 2023. She has been off of IVIG since the beginning of 2024 for the past 6 months now. She remains on azathioprine. She is not on pyridostigmine as far as she can tell.    Interval history 08/22/2022:  Previous visit was a telephone visit for me to review if restarting IVIG is appropriate. The order was approved and she is due for the initial loading dose of IVIG later this week. She continues to take azathioprine 75 mg BID. She also has a prescription for Mestinon 60 mg as needed up to 3 times a day. She is not sure if she is taking it because the medications are managed by the caregiver. She is either taking it on a regular schedule or not taking at all.    Currently she has no double vision, although this was a problem in the past. She feels her vision is a little blurry. She notices right sided ptosis when she is tired. Sometimes her daughter notices that she is slurring her words but she does not notice a difference herself. She is used to cutting solid food into very small pieces or eating soft foods. She feels chewing has been harder since she stopped getting IVIG. She has mild difficulty swallowing, which to her feels like her esophagus is smaller than it should be. She does not choke. Her chief complaint is feeling weak and tired. She is frequently feeling very tired. She is unsure how much of this is due to COPD. She gets shortness of breath easily with exertion. She has difficulty using her arms when she is tired. She feels legs are constantly a little weak. She always has to use her arms to get up from sitting position. She has been using a walker for the past 2-3 months. She denies any falls. She also complains of significant hand tremor that has been gradually getting worse. She notices this with fine tasks such as stitching. The tremor does not interfere with daily activities such as eating.    Interval history  10/24/2022:  She had an episode of bronchitis and was treated with prednisone. During this she was told she should not get IVIG and she missed the last dose of IVIG (It has been 6 weeks). She is scheduled to get it this week. She still feels tired all the time. A new complaint is double vision that she has noticed on a few occasions. This does not bother her at home but bothers her a lot when outside. She feels her legs are weaker. She feels this is because of she missed the dose of IVIG. She is taking Mestinon 60 mg 3 times a day on schedule. She complains about the hand tremor today as well. This only bothers her when she is doing something intricate and she still does not want to take another medication.    Interval history 01/30/2023:  After last clinic visit, we had a phone conversation and she reported that she is doing worse. She decided to stop IVIg, which wasn't working well anymore. Her last dose was about 6 weeks ago. We have decided to start a complement inhibitor and I prescribed Ultomiris. She received meningococcal vaccine through PCP but has not started to treatments yet. She reports feeling worse than her baseline. She has blurry vision. She has difficulty chewing solid food. She feels her legs are heavier due to progression in weakness.     Myasthenia Gravis Activities of Daily Living (MG-ADL)  None = 0  Mild = 1  Moderate = 2  Severe = 3     Talking:  None = 0: Normal  Mild = 1  Moderate = 2: Constant slurring or nasal speech, but can be understood  Severe = 3: Difficult to understand speech     Chewing:  None = 0: Normal   Mild = 1: Fatigue with solid food  Moderate = 2: Fatigue with soft food  Severe = 3: Gastric tube     Swallowing:  None = 0: Normal  Mild = 1: Rare episode of choking  Moderate = 2: Frequent choking necessitating changes in diet  Severe = 3: Gastric tube     Breathing:  None = 0: Normal neeed  Mild = 1: Shortness of breath with exertion  Moderate = 2: Shortness of breath at rest  Severe = 3: Ventilator dependance     Impairment of ability to brush teeth or comb:  None = 0: None  Mild = 1: Extra effort, but no rest periods needed  Moderate = 2: Rest periods needed  Severe = 3: Cannot do one of these functions     Impairment of ability to arise from a chair:  None = 0: None  Mild = 1: Mild, sometimes uses arms  1  Moderate = 2: Moderate, always uses arms  Severe = 3: Severe, requires assistance     Double vision:  None = 0: None  Mild = 1: Occurs, but not daily  Moderate = 2: Daily, but not constant  Severe = 3: Constant     Eyelid droop  None = 0: None  Mild = 1: Occurs, but not daily  Moderate = 2: Daily, but not constant  Severe = 3: Constant     Total: 8 on 10/24/2022 (previously 8 on 08/22/2022 and 7 on 08/15/2021)    Past Medical History:  Past Medical History:   Diagnosis Date    Breast cancer (HCC/RAF) 12/20/2018      Past Surgical History:  No past surgical history on file.  Social History:  Social History     Socioeconomic History    Marital status: Widowed   Tobacco Use    Smoking status: Former     Current packs/day: 0.00     Types: Cigarettes     Quit date: 1985     Years since quitting: 39.9    Smokeless tobacco: Former   Substance and Sexual Activity    Alcohol use: Not Currently    Drug use: Never     Social Determinants of Health     Financial Resource Strain: Low Risk  (04/12/2022)    Financial Resource Strain     Difficulty of Paying Living Expenses: Not hard at all     Family History:  No family history on file.    Allergies:  Allergies   Allergen Reactions    Beta Adrenergic Blockers Other (See Comments)     Avoid d/t Myasthenia Gravis  Avoid d/t Myasthenia Gravis      Levofloxacin Other (See Comments)     Flare of Myasthenia Gravis    Macrolides And Ketolides Other (See Comments)     Avoid d/t Myasthenia Gravis  Use with caution d/t Myasthenia Gravis  Use with caution d/t Myasthenia Gravis      Sulfa Antibiotics Other (See Comments)     May possibly have caused deafness in one ear  May possibly have caused deafness in one ear  other      Botulinum Toxins Other (See Comments)     Muscle weakness r/t Myasthenia Gravis  Muscle weakness r/t Myasthenia Gravis      Pollen Extract Wheezing    Statins Other (See Comments)     Muscle aches  Muscle aches  Muscle aches      Plastic Tape Itching and Other (See Comments)     Turns the skin red where applied     Current Medications:  Current Outpatient Medications   Medication Instructions    Albuterol Sulfate AEPB Takes 1 to 2 puffs every 4 hours as needed for wheezing or SOB Inhale Takes 1 to 2 puffs every 4 hours as needed for wheezing or SOB .    aspirin 81 mg, Oral, Daily    azaTHIOprine 50 mg tablet Take one and one-half tablet (75 mg total) by mouth two (2) times daily.    buPROPion (SR) (WELLBUTRIN SR) 200 mg, Oral, 2 times daily    CLONAZEPAM 1 mg tablet take 1 tablet by mouth at night    dapagliflozin (FARXIGA) 10 mg, Oral, Daily    DEXILANT 60 mg, Oral, Daily    DULoxetine (CYMBALTA) 60 mg, Oral, Daily    EPINEPHrine 0.3 mg/0.3 mL auto-injector     Evolocumab (REPATHA SURECLICK) 140 MG/ML SOAJ 1 pen., Subcutaneous, Every 14 days    exemestane (AROMASIN) 25 mg, Oral, Daily    fluticasone-vilanterol (BREO ELLIPTA) 100-25 mcg/inh inhaler 1 puff, Inhalation, Daily    losartan (COZAAR) 100 mg, Oral, Daily    METHENAMINE HIPPURATE 1 g tablet take one tablet by mouth two times a day    montelukast (SINGULAIR) 10 mg, Oral, As needed for    predniSONE 20 mg tablet One daily for 5 days    PYRIDOSTIGMINE 60 mg tablet TAKE 1 TABLET (60 MG TOTAL) BY MOUTH THREE (3) TIMES DAILY AS NEEDED    trimethoprim (TRIMPEX) 100 mg, Oral, Daily     Review of Systems:  Complete review of systems otherwise negative, except as noted in HPI.    Physical Examination:  Physical Examination:   Vitals signs: BP 137/71  ~ Pulse 92  ~ Temp 36.6 ?C (97.9 ?F) (Forehead)  ~ Ht 5' 5'' (1.651 m)  ~ Wt 132 lb (59.9 kg)  ~ BMI 21.97 kg/m?      General: Well appearing, in no acute distress.   HEENT: Normocephalic. No dysmorphic features. Mucous membranes are moist.   Neck: Supple  Respiratory: No increased work of breathing  Abdomen: Soft, non-tender  Extremities: Warm and well perfused. No edema.   Skin: No rashes appreciated.    NEUROLOGIC EXAMINATION:  Mental Status: The patient is alert, oriented, and attentive, with normal concentration, memory, and speech. Fund of knowledge is appropriate.    Cranial Nerves: Visual fields are full to confrontation. Pupils are equal, round, and reactive to light, 5 to 3 mm bilaterally. No ptosis. Extraocular movements are full and conjugate. No abnormal nystagmus or diplopia. Facial sensation even and symmetric to light touch. Facial movements are symmetric and have normal strength with eye closure, smile and cheek puff bilaterally. Hearing intact to conversation. No hoarseness. Palate elevates symmetrically and uvula is at midline. Trapezii and sternocleidomastoids are full strength. Tongue protrudes at midline, moves laterally well and is full strength. No tongue fasciculations.    Motor: Normal muscle bulk and tone. There were no adventitious movements or fasciculations appreciated.   Can rise from squat without difficulty  Can rise from sitting position without using arms  Jumps well    Strength (MRC Scale)  Neck extensors 4+       Neck flexors 5        Right Left  Right Left   Shoulder Int Rotation * * Hip Flexion 4 4   Shoulder Ext Rotation * * Hip Extension * *   Deltoid 4 4 Hip Adduction * *   Biceps 5 5 Hip Abduction * *   Triceps 5 5 Quadriceps 5 5   Pronation * * Hamstrings 5 5   Wrist Extension 5 5 Dorsiflexion 5 5   Wrist Flexion 5 5 Plantar Flexion 5 5   Finger Extension 5 5 Eversion * *   Interossei 5 5 Inversion * *   Finger Flexion (FDP) 5 5 EHL * *   Thumb Abduction * * Toe Flexion * *   Thumb Flexion * * Toe Extension * *   Thumb Extension * *      * Not tested    Sensory: Pinprick normal.  Vibration minimally impaired on toes.  Romberg positive.     Coordination: Bilateral hand action tremor. Also present at jaw and lower extremities. No ataxia or dysmetria.     Reflexes:   DTR Right Left   Biceps 2+ 2+   Triceps 2+ 2+   Brachioradialis 2+ 2+   Knees 2+ 2+   Ankles 2+ 2+      Negative Babinski and Hoffman's sign bilaterally. Positive cross abductors     Gait:  Normal stance width and stride length. Normal heel, toe, and tandem walking.     Diagnostic Studies:     Labs:   08/26/2015: AChR binding (0.63) blocking (28) antibody, ESR 3, CRP 4.9     Imaging:  CT chest w/ contrast 12/08/2016: No evidence of thymoma    PFTs 05/23/2022:      EDX:  EMG/NCS 09/19/2020 with Dr. Claudie Leach:  Results:  1. Left median, left ulnar, left radial, right median, right ulnar, right radial and right superficial peroneal SNAPs were normal.  2. Left  median, left ulnar, right median, right ulnar and right peroneal CMAPs were normal.  3. Left median and right median F-wave response latencies were normal.  4. Monopolar needle EMG of the right tibialis anterior was normal..  Impression:  This is a normal study. Specifically, there is no electrodiagnostic evidence of a neuropathy in this study.      Assessment:   [name] is a 87 y.o. 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 She presented to me with worsening MG symptoms after being off IVIG for 6 months. We restarted the treatments and she had improvement but that was a short lasting benefit. She has been gradully geetting worse and has higher MG-ADL and worse weakness on examination. I would like to change her treatment regimen to a complement inhibitor.     Plan:  - Continue azathioprine 75 mg BID  - Stop IVIG  - Start Ultomiris  - Continue Mestinon 60 mg TID  - Return to clinic in 2 months, in person visit    Note: Greater than 45 minutes spent on this encounter in consultation and in coordination of care.    Dellie Burns, MD.  Assistant Professor  [facility] Department of Neurology  01/30/2023 2:46 PM MD.  Assistant Professor  [facility] Department of Neurology  01/30/2023 2:46 PM

## 2023-01-30 NOTE — Telephone Encounter
Message to Practice/Provider      Message: [name] with [facility] is following up on vaccine record for vaccine done 01/23/23 Meningococcal also provide brand   Please fax back to [phone]    Patient or caller has been notified that their message will be reviewed within 1-2 business days.

## 2023-02-01 DIAGNOSIS — G7000 Myasthenia gravis without (acute) exacerbation: Secondary | ICD-10-CM

## 2023-02-07 ENCOUNTER — Ambulatory Visit: Payer: BLUE CROSS/BLUE SHIELD | Attending: Geriatric Medicine

## 2023-02-11 MED ORDER — PENICILLIN V POTASSIUM 500 MG PO TABS
500 mg | ORAL_TABLET | Freq: Two times a day (BID) | ORAL | 2 refills | Status: AC
Start: 2023-02-11 — End: ?

## 2023-02-11 NOTE — Addendum Note
Addended by: Dellie Burns on: 02/10/2023 06:19 PM     Modules accepted: Orders

## 2023-02-14 MED ORDER — WELLBUTRIN SR 200 MG PO TB12
200 mg | ORAL_TABLET | Freq: Two times a day (BID) | ORAL | 3 refills
Start: 2023-02-14 — End: ?

## 2023-02-15 MED ORDER — WELLBUTRIN SR 200 MG PO TB12
200 mg | ORAL_TABLET | Freq: Two times a day (BID) | ORAL | 3 refills | Status: AC
Start: 2023-02-15 — End: ?

## 2023-03-11 ENCOUNTER — Other Ambulatory Visit: Payer: BLUE CROSS/BLUE SHIELD

## 2023-03-11 ENCOUNTER — Telehealth: Payer: BLUE CROSS/BLUE SHIELD
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
## 2023-03-13 ENCOUNTER — Ambulatory Visit: Payer: BLUE CROSS/BLUE SHIELD

## 2023-03-13 MED ORDER — PENICILLIN V POTASSIUM 500 MG PO TABS
500 mg | ORAL_TABLET | Freq: Two times a day (BID) | ORAL | 2 refills | Status: AC
Start: 2023-03-13 — End: ?

## 2023-03-13 NOTE — Addendum Note
Addended by: Dellie Burns on: 03/13/2023 08:42 AM     Modules accepted: Orders

## 2023-03-15 ENCOUNTER — Ambulatory Visit: Payer: BLUE CROSS/BLUE SHIELD

## 2023-03-16 ENCOUNTER — Telehealth: Payer: BLUE CROSS/BLUE SHIELD

## 2023-03-16 DIAGNOSIS — N3000 Acute cystitis without hematuria: Secondary | ICD-10-CM

## 2023-03-17 MED ORDER — PYRIDOSTIGMINE BROMIDE 60 MG PO TABS
60 mg | ORAL_TABLET | Freq: Three times a day (TID) | ORAL | 4 refills | Status: AC
Start: 2023-03-17 — End: ?

## 2023-03-17 MED ORDER — DEXILANT 60 MG PO CPDR
60 mg | ORAL_CAPSULE | Freq: Every day | ORAL | 3 refills | Status: AC
Start: 2023-03-17 — End: ?

## 2023-03-17 MED ORDER — EXEMESTANE 25 MG PO TABS
25 mg | ORAL_TABLET | Freq: Every day | ORAL | 3 refills | Status: AC
Start: 2023-03-17 — End: ?

## 2023-03-19 MED ORDER — FARXIGA 10 MG PO TABS
10 mg | ORAL_TABLET | Freq: Every day | ORAL
Start: 2023-03-19 — End: ?

## 2023-03-20 MED ORDER — FARXIGA 10 MG PO TABS
10 mg | ORAL_TABLET | Freq: Every day | ORAL | 3 refills | Status: AC
Start: 2023-03-20 — End: ?

## 2023-03-27 ENCOUNTER — Ambulatory Visit: Payer: BLUE CROSS/BLUE SHIELD

## 2023-03-29 ENCOUNTER — Ambulatory Visit: Payer: BLUE CROSS/BLUE SHIELD

## 2023-04-03 ENCOUNTER — Ambulatory Visit: Payer: BLUE CROSS/BLUE SHIELD | Attending: Student in an Organized Health Care Education/Training Program

## 2023-04-17 ENCOUNTER — Ambulatory Visit: Payer: BLUE CROSS/BLUE SHIELD | Attending: Geriatric Medicine

## 2023-04-17 DIAGNOSIS — E1122 Type 2 diabetes mellitus with diabetic chronic kidney disease: Secondary | ICD-10-CM

## 2023-04-17 DIAGNOSIS — C50911 Malignant neoplasm of unspecified site of right female breast: Secondary | ICD-10-CM

## 2023-04-17 DIAGNOSIS — N1832 Type 2 diabetes mellitus with stage 3b chronic kidney disease, without long-term current use of insulin (HCC/RAF): Secondary | ICD-10-CM

## 2023-04-17 DIAGNOSIS — Z170 Estrogen receptor positive status [ER+]: Secondary | ICD-10-CM

## 2023-04-17 DIAGNOSIS — G7000 Myasthenia gravis without (acute) exacerbation: Secondary | ICD-10-CM

## 2023-04-17 DIAGNOSIS — C50912 Malignant neoplasm of unspecified site of left female breast: Secondary | ICD-10-CM

## 2023-04-17 DIAGNOSIS — N184 Chronic kidney disease, stage 4 (severe): Secondary | ICD-10-CM

## 2023-04-17 DIAGNOSIS — G959 Disease of spinal cord, unspecified: Secondary | ICD-10-CM

## 2023-04-17 DIAGNOSIS — Z0110 Encounter for examination of ears and hearing without abnormal findings: Secondary | ICD-10-CM

## 2023-04-17 DIAGNOSIS — I5022 Chronic systolic (congestive) heart failure: Secondary | ICD-10-CM

## 2023-04-17 MED ORDER — FLUTICASONE FUROATE-VILANTEROL 100-25 MCG/ACT IN AEPB
1 | Freq: Every day | RESPIRATORY_TRACT | 3 refills | Status: AC
Start: 2023-04-17 — End: ?

## 2023-04-17 MED ORDER — ALBUTEROL SULFATE 108 (90 BASE) MCG/ACT IN AEPB
3 refills | Status: AC
Start: 2023-04-17 — End: ?

## 2023-04-17 MED ORDER — CLONAZEPAM 1 MG PO TABS
ORAL_TABLET | 0 refills | Status: AC
Start: 2023-04-17 — End: ?
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
## 2023-04-19 ENCOUNTER — Telehealth: Payer: BLUE CROSS/BLUE SHIELD

## 2023-04-19 ENCOUNTER — Ambulatory Visit: Payer: BLUE CROSS/BLUE SHIELD

## 2023-04-19 DIAGNOSIS — J9611 Chronic respiratory failure with hypoxia: Secondary | ICD-10-CM

## 2023-04-19 DIAGNOSIS — C50812 Malignant neoplasm of overlapping sites of left female breast: Secondary | ICD-10-CM

## 2023-04-19 DIAGNOSIS — Z170 Estrogen receptor positive status [ER+]: Secondary | ICD-10-CM

## 2023-04-19 DIAGNOSIS — C50811 Malignant neoplasm of overlapping sites of right female breast: Secondary | ICD-10-CM

## 2023-04-19 NOTE — Telephone Encounter
 Call Back Request      Reason for call back: Kathie Rhodes from radiology called stated patient had a mammogram in August if this a routine she will have to wait till August if not order needs to be placed as stat   Please advise  Thank you     Any Symptoms:  []  Yes  [x]  No      If yes, what symptoms are you experiencing:    Duration of symptoms (how long):    Have you taken medication for symptoms (OTC or Rx):      If call was taken outside of clinic hours:    [] Patient or caller has been notified that this message was sent outside of normal clinic hours.     [] Patient or caller has been warm transferred to the physician's answering service. If applicable, patient or caller informed to please call us back if symptoms progress.  Patient or caller has been notified of the turnaround time of 1-2 business day(s).

## 2023-04-22 ENCOUNTER — Ambulatory Visit: Payer: BLUE CROSS/BLUE SHIELD | Attending: Geriatric Medicine

## 2023-04-22 ENCOUNTER — Ambulatory Visit: Payer: BLUE CROSS/BLUE SHIELD | Attending: Student in an Organized Health Care Education/Training Program

## 2023-04-22 DIAGNOSIS — G7000 Myasthenia gravis without (acute) exacerbation: Secondary | ICD-10-CM

## 2023-04-22 DIAGNOSIS — H903 Sensorineural hearing loss, bilateral: Secondary | ICD-10-CM

## 2023-04-22 NOTE — Patient Instructions
 A note from your audiologist:     Thank you for choosing [facility] for your care. It was a pleasure seeing you in clinic today.      Please call the office at [phone] if you need to return for any reason.       -------------------------------------------------------------------------------------------------------------------------------------------------------------------------------------------------------------------------------------------         Frequently asked questions:           1. How do I get my results from the visit?     If you sign up for [facility] online ([email or web address]), we will release the report online with comments once your results are available, usually within 1 week.   If you are not signed up for [facility] when your report becomes available, a copy of the audiogram/report can be obtained by contacting Medical Records:  Telephone: [phone]   In-person during the hours of 8:00 AM-4:30 PM, Monday-Friday  [facility]- [address], [address] [place], [place] [address]  [facility]- [address], [place] [address]  Please make sure your address and phone number are up-to-date in our system when you check in. We use these to contact you about important health information, including audiology reports.        2. How do I access my Audiology reports via MyChart?     Accessing Audiology notes online:    The report is viewable in [facility] ''MyChart'' under clinical notes    1. Hover your mouse over the ''Visits'' icon.   2. Click on the ''Appointments and Visits'' link.   3. Click on the desired visit.   4. Click on the ''Clinical Notes'' tab located below the ''Appointment Details'' header.   5. Listed are the types of Ambulatory Notes you may see:   Progress Notes   Consult   Procedure   H&P   Goals of Care   Interdisciplinary Notes     **All of Audiology visit notes (Reports) are considered ''Consult Notes''.     Please call [phone] for additional help if you still cannot see notes.     A copy of the audiogram/reports can be obtained by contacting Medical Records:    Phone: [phone] [facility] Medical Records    In person during the hours of 8:00 AM-4:30 PM, Monday-Friday  [facility]- [address], [address] [place], [place] [address]  [facility]- [address], [place] [address]      Brayton Mars acceder a sus informes de Audiolog?a en l?nea:      El informe se puede ver en [facility] ''MyChart''. ?Se encuentra bajo notas cl?nicas.      Para acceder al informe:   1.  Pose el cursor sobre el ?icono de ?Visits.? (consultas)  2.  Presione el enlace de ?Appointments and Visits? (citas y consultas)  3.  Haga clic en la consulta deseada  4.  Haga clic en la pesta?a ''Clinical notes? (notas cl?nicas), situado debajo del rubro ''Appontment  Details'' (detalles de la cita)  5.  A continuaci?n, se enumeran los tipos de notas ambulatorias que podr? ver:   Progress Notes (notas evolutivas)  Consult (interconsulta)   Procedure (procedimiento)    H&P (historial m?dico y un examen f?sico)   Goals of Care (metas de tratamiento)   Interdisciplinary Notes (notas interdisciplinarias)    **Todas las notas de Youth worker en Audiolog?a (Reports) se consideran ?Consult notes? (notas de interconsulta)     Por favor llame al [phone] para  obtener ayuda adicional si todav?a no puede ver las notas.        Una copia del audiograma/informes se pueden obtener al comunicarse con el archivo de historia cl?nica     Llame por Tel?fono al: [phone], [facility] Medical Records (archivo de historia cl?nica en [place])     Atenci?n disponible en persona durante las horas entre 8:00 AM-4:30 PM, de lunes a viernes     [place]- [address] [place], [place] [address]     [facility]- [address], [address], [place] [address]           3. How can I reach my audiologist after the visit?     For non-emergency contact, you may reach your audiologist two ways:     Online: send non-urgent medical questions through the [facility] website ([email or web address]). These are usually returned within 2 business days.  Call the [facility] at [phone] during business hours to leave a message (or schedule a return appointment).
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
## 2023-04-25 ENCOUNTER — Ambulatory Visit: Payer: BLUE CROSS/BLUE SHIELD | Attending: Geriatric Medicine
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
## 2023-05-02 ENCOUNTER — Ambulatory Visit: Payer: BLUE CROSS/BLUE SHIELD

## 2023-05-03 ENCOUNTER — Telehealth: Payer: BLUE CROSS/BLUE SHIELD

## 2023-05-03 ENCOUNTER — Other Ambulatory Visit: Payer: BLUE CROSS/BLUE SHIELD

## 2023-05-03 MED ORDER — ALBUTEROL SULFATE 108 (90 BASE) MCG/ACT IN AEPB
3 refills | Status: AC
Start: 2023-05-03 — End: ?

## 2023-05-03 MED ORDER — FLUTICASONE FUROATE-VILANTEROL 100-25 MCG/ACT IN AEPB
1 | Freq: Every day | RESPIRATORY_TRACT | 3 refills | Status: AC
Start: 2023-05-03 — End: ?

## 2023-05-06 NOTE — Telephone Encounter
 PA Information:   non formulary   Pcn: IS  bin: [id number]  ID: [id number]  RX group: [id number]    P. [phone]    Spoke to Thomasene Lot with Prescription plan-   Informed pharmacy is running [employer] brand which is not covered.   Generic Brand is covered.     Placed a follow up call to [facility] -   They ran generic brand covered - no PA required.     Spoke with pt informed generic albuterol covered- pt expressed her thanks.

## 2023-05-09 ENCOUNTER — Other Ambulatory Visit: Payer: BLUE CROSS/BLUE SHIELD

## 2023-05-09 ENCOUNTER — Ambulatory Visit: Payer: BLUE CROSS/BLUE SHIELD | Attending: Geriatric Medicine

## 2023-05-09 DIAGNOSIS — H903 Sensorineural hearing loss, bilateral: Secondary | ICD-10-CM

## 2023-05-09 NOTE — Patient Instructions
 A note from your audiologist:     Thank you for choosing [facility] for your care. It was a pleasure seeing you in clinic today.       Recommendations:    Return for hearing aid check as needed.  Full time use of hearing aid is recommended.  patient was   advised to return for regularly-scheduled hearing aid checks, and to contact the clinic immediately if hearing aids appeared to be malfunctioning after troubleshooting at home.    Routine hearing evaluation annually. Return for re-evaluation sooner, if concerns for a change in hearing arise or if medically indicated.  No charge for replacement hearing aid fitting from [employer]'s disaster relief program.          Please call the office at [phone] if you need to return sooner for any reason.     -------------------------------------------------------------------------------------------------------------------------------------------------------------------------------------------------------------------------------------------    Frequently asked questions:     1. How do I get my results from the visit?     If you sign up for [facility] online ([email or web address]), we will release the report online with comments once your results are available, usually within 1 week.   If you are not signed up for [facility] when your report becomes available, a copy of the audiogram/report can be obtained by contacting Medical Records:  Telephone: [phone]   In-person during the hours of 8:00 AM-4:30 PM, Monday-Friday  [facility]- [address], [address] [place], [place] [address]  [facility]- [address], [place] [address]  Please make sure your address and phone number are up-to-date in our system when you check in. We use these to contact you about important health information, including audiology reports.    2. How do I access my Audiology reports via MyChart?     Accessing Audiology notes online:    The report is viewable in [place] ''MyChart'' under clinical notes    1. Hover your mouse over the ''Visits'' icon.   2. Click on the ''Appointments and Visits'' link.   3. Click on the desired visit.   4. Click on the ''Clinical Notes'' tab located below the ''Appointment Details'' header.   5. Listed are the types of Ambulatory Notes you may see:   Progress Notes   Consult   Procedure   H&P   Goals of Care   Interdisciplinary Notes     **All of Audiology visit notes (Reports) are considered ''Consult Notes''.     Please call [phone] for additional help if you still cannot see notes.     A copy of the audiogram/reports can be obtained by contacting Medical Records:    Phone: [phone] [facility] Medical Records    In person during the hours of 8:00 AM-4:30 PM, Monday-Friday  [facility]- [address], [address] [place], [place] [address]  [facility]- [address], [place] [address]      Brayton Mars acceder a sus informes de Audiolog?a en l?nea:      El informe se puede ver en [place] ''MyChart''. ?Se encuentra bajo notas cl?nicas.      Para acceder al informe:   1.  Pose el cursor sobre el ?icono de ?Visits.? (consultas)  2.  Presione el enlace de ?Appointments and Visits? (citas y consultas)  3.  Haga clic en la consulta deseada  4.  Haga clic en la pesta?a ''Clinical notes? (notas cl?nicas), situado debajo del rubro ''Appontment  Details'' (detalles de la cita)  5.  A continuaci?n, se enumeran los tipos de notas ambulatorias que podr? ver:   Progress Notes (notas evolutivas)  Consult (interconsulta)   Procedure (procedimiento)    H&P (historial m?dico y un examen f?sico)   Goals of Care (metas de tratamiento)   Interdisciplinary Notes (notas interdisciplinarias)    **Todas las notas de Youth worker en Audiolog?a (Reports) se consideran ?Consult notes? (notas de interconsulta)     Por favor llame al [phone] para obtener ayuda adicional si todav?a no puede ver las notas.        Una copia del audiograma/informes se pueden obtener al comunicarse con el archivo de historia cl?nica     Llame por Tel?fono al: [phone], [facility] Medical Records (archivo de historia cl?nica en [place])     Atenci?n disponible en persona durante las horas entre 8:00 AM-4:30 PM, de lunes a viernes     [facility]- [address] [place], [place] [address]     [facility]- [address], [place] [address]       3. How can I reach my audiologist after the visit?     For non-emergency contact, you may reach your audiologist two ways:     Online: send non-urgent medical questions through the [facility] website ([email or web address]). These are usually returned within 2 business days.  Call the [facility] at [phone] during business hours to leave a message (or schedule a return appointment).      4. How do I get after-hours care for my hearing devices?    Hearing Aids  Contact hearing aid company directly for connection questions regarding your mobile phone/electronic device, accessory devices, smartphone App, and other technical support  [employer]   Call [phone]  Visit [email or web address] for troubleshooting information  [employer]   Call [phone]  Visit [email or web address] for troubleshooting information  [employer]   Call [phone]   Visit https://www.resound.com/en-us/help/hearing-aids for troubleshooting information  Email at consumerhelp@[email or web address]  [employer]  Call [phone]  Visit [email or web address] for troubleshooting information  Freddy Finner   Call [phone]   Visit https://www.starkey.com/support/get-help for troubleshooting information  [employer]   Call [phone]  Visit https://www.widex.com/en-us/support/ for troubleshooting information    Bone Conduction Hearing Devices  Contact bone conduction hearing company directly to for troubleshooting and to order any necessary equipment.  [employer]   Call [phone]  Email at [email or web address]  Visit [email or web address]  [employer]  Call [phone]  Visit [email or web address]  [employer]   Call [phone]  Visit: https://www.medel.com/en-us/support/product-support    [employer]  Contact cochlear implant company directly to for troubleshooting and to order any necessary equipment.  [employer]   Call [phone]  Email at [email or web address]  Visit [email or web address]   [employer] Free: [phone]) [phone] [place]   Email: [email or web address]   Visit: https://[email or web address].com/sg/en/home/support/troubleshooting-guide.html  [employer]   Call [phone]  Visit: https://www.medel.com/en-us/support/product-support

## 2023-05-13 ENCOUNTER — Telehealth: Payer: BLUE CROSS/BLUE SHIELD

## 2023-05-13 NOTE — Telephone Encounter
 Hello, pt is asking for a code that is requested by [employer]?  She is saying that the app is asking her for an access code/ number to use the hearing aid with it    She is also asking if we haven any instructions to provide her for her replacement device

## 2023-05-14 ENCOUNTER — Other Ambulatory Visit: Payer: BLUE CROSS/BLUE SHIELD

## 2023-05-17 ENCOUNTER — Telehealth: Payer: BLUE CROSS/BLUE SHIELD

## 2023-05-20 ENCOUNTER — Telehealth: Payer: BLUE CROSS/BLUE SHIELD

## 2023-05-20 NOTE — Telephone Encounter
 left vm to cancel appt due to not having ride

## 2023-05-22 ENCOUNTER — Ambulatory Visit: Payer: BLUE CROSS/BLUE SHIELD

## 2023-05-23 ENCOUNTER — Other Ambulatory Visit: Payer: BLUE CROSS/BLUE SHIELD

## 2023-05-30 ENCOUNTER — Other Ambulatory Visit: Payer: BLUE CROSS/BLUE SHIELD

## 2023-06-06 ENCOUNTER — Ambulatory Visit: Payer: BLUE CROSS/BLUE SHIELD | Attending: Geriatric Medicine

## 2023-06-06 DIAGNOSIS — R0602 Shortness of breath: Secondary | ICD-10-CM

## 2023-06-06 MED ORDER — TIOTROPIUM BROMIDE MONOHYDRATE 18 MCG IN CAPS
18 ug | ORAL_CAPSULE | Freq: Every day | RESPIRATORY_TRACT | 3 refills | Status: AC
Start: 2023-06-06 — End: ?
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
## 2023-06-11 ENCOUNTER — Ambulatory Visit: Payer: BLUE CROSS/BLUE SHIELD

## 2023-06-11 DIAGNOSIS — J441 Chronic obstructive pulmonary disease with (acute) exacerbation: Secondary | ICD-10-CM

## 2023-06-11 DIAGNOSIS — R0602 Shortness of breath: Secondary | ICD-10-CM

## 2023-06-11 NOTE — ED Notes
 For any updates please call Bernis Brisker [phone]

## 2023-06-11 NOTE — ED Notes
 Patient started to experience pain in L forearm from IV Magnesium  . Provided warm pack and switched IV to R hand 22gauge.
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
## 2023-06-11 NOTE — ED Notes
 Fabian Holster (son-in law) and Eugenia called for patient update, no further questions at this time

## 2023-06-11 NOTE — ED Notes
 Dr. Harlon Light at bedside speaking with patient

## 2023-06-12 ENCOUNTER — Inpatient Hospital Stay
Admit: 2023-06-12 | Discharge: 2023-06-14 | Disposition: A | Discharge status: Home / Self Care | Payer: BLUE CROSS/BLUE SHIELD | Source: Home / Self Care | Attending: Student in an Organized Health Care Education/Training Program

## 2023-06-12 DIAGNOSIS — I509 Heart failure, unspecified: Secondary | ICD-10-CM

## 2023-06-12 DIAGNOSIS — N1832 Type 2 diabetes mellitus with stage 3b chronic kidney disease, without long-term current use of insulin (HCC/RAF): Secondary | ICD-10-CM

## 2023-06-12 DIAGNOSIS — I129 Hypertensive chronic kidney disease with stage 1 through stage 4 chronic kidney disease, or unspecified chronic kidney disease: Secondary | ICD-10-CM

## 2023-06-12 DIAGNOSIS — J9601 Acute respiratory failure with hypoxia: Secondary | ICD-10-CM

## 2023-06-12 DIAGNOSIS — E1122 Type 2 diabetes mellitus with diabetic chronic kidney disease: Secondary | ICD-10-CM

## 2023-06-12 DIAGNOSIS — G7000 Myasthenia gravis without (acute) exacerbation: Secondary | ICD-10-CM

## 2023-06-12 DIAGNOSIS — N183 Benign hypertension with CKD (chronic kidney disease) stage III (HCC/RAF): Secondary | ICD-10-CM

## 2023-06-12 DIAGNOSIS — K5792 Diverticulitis of intestine, part unspecified, without perforation or abscess without bleeding: Secondary | ICD-10-CM

## 2023-06-12 DIAGNOSIS — I5043 Acute on chronic combined systolic (congestive) and diastolic (congestive) heart failure: Secondary | ICD-10-CM

## 2023-06-12 LAB — Basic Metabolic Panel
ESTIMATED GFR 2021 CKD-EPI: 36 mL/min/{1.73_m2} (ref 8–19)
GLUCOSE: 156 mg/dL — ABNORMAL HIGH (ref 65–99)
POTASSIUM: 4.8 mmol/L (ref 3.6–5.3)
POTASSIUM: 4.8 mmol/L (ref 3.6–5.3)
POTASSIUM: 4.8 mmol/L (ref 3.6–5.3)
POTASSIUM: 4.3 mmol/L (ref 3.6–5.3)

## 2023-06-12 LAB — Differential Automated
EOSINOPHIL PERCENT, AUTO: 1.1 % (ref 0.20–0.80)
EOSINOPHIL PERCENT, AUTO: 0 % (ref 0.00–0.10)

## 2023-06-12 LAB — HS Troponin I (Reflexed): HIGH SENSITIVITY TROPONIN I: 13 ng/L — ABNORMAL HIGH (ref <5)

## 2023-06-12 LAB — HS Troponin I + Reflex If >=  5 ng/L: HIGH SENSITIVITY TROPONIN I: 32 ng/L — ABNORMAL HIGH (ref <5)

## 2023-06-12 LAB — Procalcitonin: PROCALCITONIN: <0.1 ug/L (ref <0.10)

## 2023-06-12 LAB — Phosphorus: PHOSPHORUS: 2.8 mg/dL (ref 2.5–4.5)

## 2023-06-12 LAB — B-Type Natriuretic Peptide: BNP: 2160 pg/mL — ABNORMAL HIGH (ref <100)

## 2023-06-12 LAB — CBC
HEMATOCRIT: 43.8 % (ref 34.9–45.2)
RED CELL DISTRIBUTION WIDTH-CV: 15.5 % (ref 11.1–15.5)

## 2023-06-12 LAB — Prothrombin Time Panel: INR: 0.9 s (ref 11.5–5.0)

## 2023-06-12 LAB — Expedited COVID-19 and Influenza A B PCR: INFLUENZA A PCR: NOT DETECTED

## 2023-06-12 LAB — Blood Gases, venous,POC
BICARBONATE, VENOUS,POC: 26 mmol/L (ref 23.0–31.0)
PO2,VENOUS,POC: 57 mmHg (ref 37–65)

## 2023-06-12 LAB — Hepatic Funct Panel: BILIRUBIN,TOTAL: 0.2 mg/dL (ref 0.1–1.2)

## 2023-06-12 LAB — Magnesium
MAGNESIUM: 2.1 meq/L — ABNORMAL HIGH (ref 1.4–1.9)
MAGNESIUM: 2 meq/L — ABNORMAL HIGH (ref 1.4–1.9)

## 2023-06-12 MED ADMIN — DULOXETINE HCL 60 MG PO CPEP: 60 mg | ORAL | @ 16:00:00 | Stop: 2023-06-15 | NDC 60687074511

## 2023-06-12 MED ADMIN — PYRIDOSTIGMINE BROMIDE 60 MG PO TABS: 60 mg | ORAL | @ 14:00:00 | Stop: 2023-06-15 | NDC 68084049411

## 2023-06-12 MED ADMIN — PREDNISONE 20 MG PO TABS: 40 mg | ORAL | @ 16:00:00 | Stop: 2023-06-15 | NDC 00904712761

## 2023-06-12 MED ADMIN — AZATHIOPRINE 50 MG PO TABS: 75 mg | ORAL | @ 17:00:00 | Stop: 2023-06-15 | NDC 68084022901

## 2023-06-12 MED ADMIN — HEPARIN SODIUM (PORCINE) 5000 UNIT/ML IJ SOLN: 5000 [IU] | SUBCUTANEOUS | @ 08:00:00 | Stop: 2023-06-15 | NDC 00409272330

## 2023-06-12 MED ADMIN — ALBUTEROL SULFATE (5 MG/ML) IN NEBU 0.5 ML VIAL: 5 mg | RESPIRATORY_TRACT | @ 04:00:00 | Stop: 2023-06-12 | NDC 00487990130

## 2023-06-12 MED ADMIN — SODIUM CHLORIDE 0.9% IV SOLN (100 ML): 5 mL/h | INTRAVENOUS | @ 04:00:00 | Stop: 2023-06-12 | NDC 00338004948

## 2023-06-12 MED ADMIN — PYRIDOSTIGMINE BROMIDE 60 MG PO TABS: 60 mg | ORAL | @ 08:00:00 | Stop: 2023-06-15 | NDC 68084049411

## 2023-06-12 MED ADMIN — MONTELUKAST SODIUM 10 MG PO TABS: 10 mg | ORAL | @ 08:00:00 | Stop: 2023-06-15 | NDC 00904680861

## 2023-06-12 MED ADMIN — PREDNISONE 20 MG PO TABS: 60 mg | ORAL | @ 04:00:00 | Stop: 2023-06-12 | NDC 60687014511

## 2023-06-12 MED ADMIN — MAGNESIUM SULFATE 2 GM/50ML IV SOLN: 2 g | INTRAVENOUS | @ 04:00:00 | Stop: 2023-06-12 | NDC 70121171901

## 2023-06-12 MED ADMIN — FUROSEMIDE 10 MG/ML IJ SOLN: 40 mg | INTRAVENOUS | @ 08:00:00 | Stop: 2023-06-12 | NDC 71288020304

## 2023-06-12 MED ADMIN — PYRIDOSTIGMINE BROMIDE 60 MG PO TABS: 60 mg | ORAL | @ 20:00:00 | Stop: 2023-06-15 | NDC 68084049411

## 2023-06-12 MED ADMIN — DAPAGLIFLOZIN PROPANEDIOL 10 MG PO TABS: 10 mg | ORAL | @ 16:00:00 | Stop: 2023-06-15 | NDC 00310621039

## 2023-06-12 MED ADMIN — PANTOPRAZOLE SODIUM 40 MG PO TBEC: 40 mg | ORAL | @ 16:00:00 | Stop: 2023-06-15 | NDC 60687073609

## 2023-06-12 MED ADMIN — BUPROPION HCL ER (SR) 100 MG PO TB12: 200 mg | ORAL | @ 16:00:00 | Stop: 2023-06-13

## 2023-06-12 MED ADMIN — HEPARIN SODIUM (PORCINE) 5000 UNIT/ML IJ SOLN: 5000 [IU] | SUBCUTANEOUS | @ 16:00:00 | Stop: 2023-06-15 | NDC 00409272330

## 2023-06-12 MED ADMIN — ASPIRIN 81 MG PO TBEC: 81 mg | ORAL | @ 16:00:00 | Stop: 2023-06-15 | NDC 71399862701

## 2023-06-12 MED ADMIN — LOSARTAN POTASSIUM 50 MG PO TABS: 100 mg | ORAL | @ 16:00:00 | Stop: 2023-06-15 | NDC 50268050511

## 2023-06-12 MED ADMIN — AZATHIOPRINE 50 MG PO TABS: 75 mg | ORAL | @ 08:00:00 | Stop: 2023-06-15 | NDC 68084022901

## 2023-06-12 MED ADMIN — BUPROPION HCL ER (SR) 100 MG PO TB12: 200 mg | ORAL | @ 08:00:00 | Stop: 2023-06-13 | NDC 68084069711

## 2023-06-12 MED ADMIN — IPRATROPIUM BROMIDE 0.02 % IN SOLN: 500 ug | RESPIRATORY_TRACT | @ 04:00:00 | Stop: 2023-06-12 | NDC 60687039479

## 2023-06-12 NOTE — Goals of Care
 Advance Care Planning   Goals of Care     Upon admission history and physical, patient A&Ox4, lucid, understanding of current medical situation. Confirmed DNR/SNI status. Has an advanced directive and POLSE since 2020 to this effect.   DNR/DNI order placed.  To be discussed with attending, Dr. Shannon Darter, in AM.  Michal Agar, MD  PGY-2 [facility] Internal Medicine

## 2023-06-12 NOTE — Other
 Patients Clinical Goal: Clinical Goal(s) for the Shift: Hemodynamically stable throughout the shift, cluster care to promote sleep, SPO2>93  Stability of the patient: Moderately Unstable - medium risk of patient condition declining or worsening    High fall risk factors: Diuretic  Primary Language: English  Immunizations Needed: Up to date  Sleep Menu Preferences: No               Other/Significant Events: Admitted to the unit. 2 RN skin check done with Alisa App, Charity fundraiser. Patient SOB on exertion      Review of Systems  Neuro: Level of Consciousness: WDL Orientation Level: WDL   Resp: O2 Device: Nasal cannula   Cardiac:Sinus tachycardia  GI/Endocrine:  No data recorded     GU: GU Method: External urinary collection device  Ambulatory Status/Limitations:BMAT Level: Level 3 - Yellow  Skin:   Psychosocial: Psychosocial and/or Care Management Needs: Low Frequency   What is most important to patient/family? : 3-5 min sitdown with patient to discuss POC, patient wants to sleep >4 hours  Was the patient/family goal completed this shift? Yes     Evaluation of Lines/Access  Peripheral IV 20 G Anterior;Distal;Left Forearm (1)  Peripheral IV 22 G Posterior;Right Hand (1)   If PICC, how many cm out?N/A    Procedures/Test/Consults  Done today: CXR, Labs  Pending: Labs     Overview of Vitals/Critical Labs  Pain: Denies  Patient Vitals for the past 12 hrs:   BP Temp Temp src Pulse Resp SpO2 Height Weight   06/12/23 0013 -- -- -- -- -- -- 1.651 m (5' 5'') 66.7 kg (147 lb)   06/12/23 0001 129/96 36.8 ?C (98.2 ?F) Oral (!) 105 23 95 % -- --   06/11/23 2337 -- -- -- (!) 109 19 94 % -- --   06/11/23 2300 135/70 -- -- (!) 103 14 94 % -- --   06/11/23 2200 136/65 -- -- 99 16 97 % -- --   06/11/23 2142 -- -- -- 97 18 97 % -- --   06/11/23 2100 141/68 -- -- (!) 105 (!) 25 96 % -- --   06/11/23 1939 -- -- -- -- -- -- -- 64.4 kg (142 lb)   06/11/23 1938 157/86 36.6 ?C (97.8 ?F) Oral (!) 110 (!) 26 100 % -- --     Critical Labs: BNP  Is the patient positive for severe sepsis/septic shock screen?: No    Review of Discharge Planning  Plan/goals: Pulmonary hygiene, diuretics  Anticipated Date of Discharge: 06/14/2023    Anticipated Disposition:     Pending BOOST interventions? Yes, please see BOOST Note under Interdisciplinary Notes tab  Discharge support person: Family: Daughter/Son  Update to support person: Support person will visit daily  Barriers:None     Patient Education  New meds (past 24 hrs): See MAR    Does patient meet criteria to watch Fall Prevention Video? If patient doesn't meet criteria, please indicate why. Yes   If patient meets criteria, date patient watched fall prevention video: June 12, 2023    High Risk Medication Teachback: Done  Other teaching: POC
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
## 2023-06-12 NOTE — Other
 Patient's Clinical Goal:   Clinical Goal(s) for the Shift: VSS,monitor respiratory status, monitor oxygen saturation, keep hemodynamically stable, pain management  Identify possible barriers to advancing the care plan: none  Stability of the patient: Moderately Stable - low risk of patient condition declining or worsening   Progression of Patient's Clinical Goal:   A/O X 4, hard lf hearing  Not in respiratory distress  VS stable.  Afebrile  BMAT 3  Continue nebs  Continue diuresis  Oxygen use per patient's comfort.  Explained that using Oxygen all the time might put her risk to have Oxygen toxicity.  Patient still wants to use O2.    Jeralyn Mon, RN BSN PCCN
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
## 2023-06-12 NOTE — Nursing Note
 Break Relief RN: Pt in bed, comfortable, no needs at this time. Call light within reach. Bed alarm on.

## 2023-06-12 NOTE — Interdisciplinary
 BOOST Note  (Better Outcomes by Optimizing Safe Transitions)   Name: [name]  MRN: [id number]  DOB: 26-Jul-1935    Date of Admit: 06/11/2023  Anticipated Date of Discharge: 06/14/2023    Anticipated Disposition: Home   BOOST Element Patient Assessment Best Practices/Interventions   P1 # Problems with Medications    []  N/A Was re-hospitalization due to a medication?   []  Yes        [x]  No  If yes, indicate which med:    [x]  Teachback performed to educate on administration/timing of medication and managing adverse drug events.      Indicate which High Risk Medications the patient has prescribed:  []  Insulin    [x]  Anticoagulants   []  Opioids     []  Immunosuppressive Drugs    []  N/A        Teachback is completed for:  []  Insulin     [x]  Anticoagulants  []  Opioids    []  Immunosuppressives    [x]  Patient/family need(s) reinforcement on:     [x]  Educate patient/family on the Meds to [facility].    Patient is on the following common medications:  [x]  BP meds   [x]  Diuretics  []  Statins  []  N/A        Patient will be discharged on a new medication?  []  No       []  Yes, indicate which one:  Teachback is completed for:  [x]  BP meds  []  Diuretics  []  Statins    [x]  Patient/family need(s) reinforcement on:    [x]  Teachback completed on new medication(s) for discharge.   P2 # Psychological    [x]  N/A [place] Suicide Risk Assessment:  No risk  []  Patient may benefit from SW consult due to: Social Worker consult completed?  []  Yes  []  No  []  Pending   P3 #  Principal Diagnosis  []  N/A Patient has diagnosis of:  []  Cancer  []  Stroke  []  Diabetes [x]  COPD    [x]  Heart Failure  []  Acute MI  []  Pneumonia    []  Hx of transplant  []  Sepsis Teachback completed on:  [x]  Reason for re-hospitalization related to illness  [x]  Who to call/what to do in event of worsening symptoms  [x]  S/S to watch for  [x]  Self-care management  [x]  Importance of follow up care    Patient needs/is experiencing the following:  []  DME  []  O2 at home  []  Presence of foley    []  Poor oral intake <50%  []  No BM>2 days    [] Positive CAM  []  MD aware   []  CM aware  []  SW aware      Patient needs the following Discharge DME:  []  AVAP []  Nebulizer  []  Portable O2    []  Wound Vac []  DME delivered to bedside  []  DME delivered to home  []  Pending delivery (indicate which one):     LDA Teach Back education required for discharge:  []  Oxygen  []  PICC []  PortACath  []  Enteral feeding  []  Foley catheter (leg bag)   []  Drain (indicate):    []  PleurX  []  Wound Vac []  Teach back completed for (indicate):    []  Needs reinforcement for:   P4 # Physical Limitations  [x]  N/A SLP consult indicated?  []  Yes   []  No SLP consult completed?  []  Yes  []  Pending  []  No    PT consult indicated?  []  Yes   []  No PT  consult completed?  []  Yes  []  Pending  []  No    Discharge recommendation from PT/OT:    ,      Discharge DME recommended by PT/OT:       Discharge DME needed:  []  FWW  []  W/C  []  Cane  []  Crutches    []  Other:  []  DME delivered to bedside  []  DME delivered to home  []  Pending delivery (indicate which one):    Discharge transportation needs:  []  Self  []  Family/friend:   []  Taxi/Uber    []  Ambulance  []  ACLS     []  RN     []  RT    []  Non-emergent transportation   Is transport set up?  []  Yes  []  Notified CM of need    []  Pending confirmation of transport   P5 # Health Literacy  [x]  N/A []  Patient has poor health literacy due to:   Identify committed discharge support person:  []  Self  []  Spouse  []  Family:   []  Friend:      []  Caregiver     []  Document appropriate educational interventions used (i.e. handouts, teach back, used interpreter, etc.)   P6 # Patient Support  [x]  N/A Patient needs the following support:  []  Insufficient care at home  []  Caregiver unable to provide adequate care  []  Caregiver unable to make appropriate decisions  []  Homeless  []  Food instability    []  Transportation  []  Info on additional resources  []  Other:   Notified team of need:  []  MD   []  CM   []  SW    Child psychotherapist consult completed?  []  Yes   []  Pending   []  No     P7 # Prior Hospitalization  [x]  N/A Patient was hospitalized in the last 6 months. Reasons for re-hospitalization:  []  Severity of illness    []  Missed f/u care with PMD within 7 days of discharge  []  Did not report s/s to PMD    []  Medication related  []  Missed HD due to:     []  Lack of transportation to f/u care    []  Other:  []  Document appropriate education on illness, medications, importance of follow up appointments, being aware of worsening s/s and what to do/who to contact   P8 # Palliative Care  [x]  N/A Patient has uncontrolled symptoms related to life-limiting illness?  []  Yes      []  No   Primary team notified to consider symptom control:  []  Yes   []  Pending   []  No    Does the patient/family need a goals of care discussion?   []  Yes      []  No Was the goals of care discussion held?  []  Yes   []  Pending   []  No    Does the patient need a palliative care consult?  []  Yes      []  No Was the palliative care consult completed?  []  Yes   []  Pending   []  No

## 2023-06-13 LAB — Basic Metabolic Panel
CREATININE: 1.47 mg/dL — ABNORMAL HIGH (ref 0.60–1.30)
GLUCOSE: 122 mg/dL — ABNORMAL HIGH (ref 65–99)

## 2023-06-13 LAB — Magnesium: MAGNESIUM: 2.2 meq/L — ABNORMAL HIGH (ref 1.4–1.9)

## 2023-06-13 LAB — Differential Automated: ABSOLUTE BASO COUNT: 0.02 10*3/uL (ref 0.00–0.10)

## 2023-06-13 LAB — CBC: RED CELL DISTRIBUTION WIDTH-SD: 55 fL — ABNORMAL HIGH (ref 36.9–48.3)

## 2023-06-13 LAB — Blood Gases, venous,POC: BASE EXCESS,VENOUS,POC: 5 mmol/L (ref 7.30–7.40)

## 2023-06-13 LAB — Phosphorus: PHOSPHORUS: 3.1 mg/dL (ref 2.5–4.5)

## 2023-06-13 MED ORDER — PREDNISONE 20 MG PO TABS
40 mg | ORAL_TABLET | Freq: Every day | ORAL | 0 refills | Status: AC
Start: 2023-06-13 — End: ?

## 2023-06-13 MED ADMIN — HEPARIN SODIUM (PORCINE) 5000 UNIT/ML IJ SOLN: 5000 [IU] | SUBCUTANEOUS | @ 01:00:00 | Stop: 2023-06-15 | NDC 00409272330

## 2023-06-13 MED ADMIN — AZATHIOPRINE 50 MG PO TABS: 75 mg | ORAL | @ 04:00:00 | Stop: 2023-06-15 | NDC 68084022901

## 2023-06-13 MED ADMIN — PYRIDOSTIGMINE BROMIDE 60 MG PO TABS: 60 mg | ORAL | @ 18:00:00 | Stop: 2023-06-15 | NDC 68084049411

## 2023-06-13 MED ADMIN — PYRIDOSTIGMINE BROMIDE 60 MG PO TABS: 60 mg | ORAL | @ 04:00:00 | Stop: 2023-06-15 | NDC 68084049411

## 2023-06-13 MED ADMIN — LOSARTAN POTASSIUM 50 MG PO TABS: 100 mg | ORAL | @ 16:00:00 | Stop: 2023-06-15 | NDC 68084034711

## 2023-06-13 MED ADMIN — DAPAGLIFLOZIN PROPANEDIOL 10 MG PO TABS: 10 mg | ORAL | @ 16:00:00 | Stop: 2023-07-12 | NDC 00310621039

## 2023-06-13 MED ADMIN — PYRIDOSTIGMINE BROMIDE 60 MG PO TABS: 60 mg | ORAL | @ 13:00:00 | Stop: 2023-06-15 | NDC 68084049411

## 2023-06-13 MED ADMIN — BUPROPION HCL ER (SR) 200 MG PO TB12: 200 mg | ORAL | @ 17:00:00 | Stop: 2023-06-15 | NDC 00173072200

## 2023-06-13 MED ADMIN — HEPARIN SODIUM (PORCINE) 5000 UNIT/ML IJ SOLN: 5000 [IU] | SUBCUTANEOUS | @ 16:00:00 | Stop: 2023-07-12 | NDC 00409272330

## 2023-06-13 MED ADMIN — PANTOPRAZOLE SODIUM 40 MG PO TBEC: 40 mg | ORAL | @ 16:00:00 | Stop: 2023-06-15 | NDC 60687073609

## 2023-06-13 MED ADMIN — DULOXETINE HCL 60 MG PO CPEP: 60 mg | ORAL | @ 16:00:00 | Stop: 2023-06-15 | NDC 60687074511

## 2023-06-13 MED ADMIN — ZZ IMS TEMPLATE: 1.5 mg | ORAL | @ 04:00:00 | Stop: 2023-06-15 | NDC 00904772861

## 2023-06-13 MED ADMIN — BUPROPION HCL ER (SR) 100 MG PO TB12: 200 mg | ORAL | @ 05:00:00 | Stop: 2023-06-13

## 2023-06-13 MED ADMIN — IPRATROPIUM-ALBUTEROL 0.5-2.5 (3) MG/3ML IN SOLN: 3 mL | RESPIRATORY_TRACT | @ 16:00:00 | Stop: 2023-06-15 | NDC 60687040579

## 2023-06-13 MED ADMIN — LOPERAMIDE HCL 1 MG/7.5ML PO SOLN: 2 mg | ORAL | @ 18:00:00 | Stop: 2023-06-15 | NDC 68094002959

## 2023-06-13 MED ADMIN — IPRATROPIUM-ALBUTEROL 0.5-2.5 (3) MG/3ML IN SOLN: 3 mL | RESPIRATORY_TRACT | @ 01:00:00 | Stop: 2023-06-15 | NDC 60687040579

## 2023-06-13 MED ADMIN — PREDNISONE 20 MG PO TABS: 40 mg | ORAL | @ 16:00:00 | Stop: 2023-06-15 | NDC 60687014511

## 2023-06-13 MED ADMIN — BUPROPION HCL ER (SR) 200 MG PO TB12: 200 mg | ORAL | @ 05:00:00 | Stop: 2023-06-15 | NDC 00173072200

## 2023-06-13 MED ADMIN — CALCIUM CARBONATE ANTACID 500 (200 CA) MG PO CHEW: 500 mg | ORAL | @ 01:00:00 | Stop: 2023-06-15 | NDC 68084098833

## 2023-06-13 MED ADMIN — AZATHIOPRINE 50 MG PO TABS: 75 mg | ORAL | @ 16:00:00 | Stop: 2023-06-15 | NDC 68084022901

## 2023-06-13 MED ADMIN — MONTELUKAST SODIUM 10 MG PO TABS: 10 mg | ORAL | @ 01:00:00 | Stop: 2023-06-15 | NDC 00904680861

## 2023-06-13 MED ADMIN — HEPARIN SODIUM (PORCINE) 5000 UNIT/ML IJ SOLN: 5000 [IU] | SUBCUTANEOUS | @ 06:00:00 | Stop: 2023-06-15 | NDC 00409272330

## 2023-06-13 MED ADMIN — ASPIRIN 81 MG PO TBEC: 81 mg | ORAL | @ 16:00:00 | Stop: 2023-06-15 | NDC 71399862701

## 2023-06-13 NOTE — Other
 Patients Clinical Goal: Clinical Goal(s) for the Shift: VSS, free from respiratory distre, no falls/injuries, comfort, safety  Stability of the patient: Moderately Unstable - medium risk of patient condition declining or worsening    High fall risk factors: Diuretic  Primary Language: English  Immunizations Needed: Up to date  Sleep Menu Preferences: No               Other/Significant Events: none       Review of Systems  Neuro: Level of Consciousness: Return to [facility] Orientation Level: Return to [facility]   Resp: O2 Device: None (Room air) satting >88% overnight  Cardiac: (non tele)  GI/Endocrine:  Last BM Date: 06/13/23   Stool Appearance: Unable to assess   GU: GU Method: External urinary collection device  Ambulatory Status/Limitations:BMAT Level: Level 3 - Yellow  Skin:   Psychosocial: Psychosocial and/or Care Management Needs: Low Frequency   What is most important to patient/family? : Wants breathing to continue to improve  Was the patient/family goal completed this shift? Yes     Evaluation of Lines/Access  Peripheral IV 20 G Anterior;Distal;Left Forearm (2)  Peripheral IV 22 G Posterior;Right Hand (2)   If PICC, how many cm out?N/A    Procedures/Test/Consults  Done today: none  Pending: ECHO     Overview of Vitals/Critical Labs  Pain: denies pain  Patient Vitals for the past 12 hrs:   BP Temp Temp src Pulse Resp SpO2 Weight   06/13/23 0338 -- -- -- -- -- -- 64 kg (141 lb 3.2 oz)   06/13/23 0326 122/69 36.9 ?C (98.5 ?F) Oral 83 20 95 % --   06/12/23 2248 115/52 37 ?C (98.6 ?F) Oral 92 20 93 % --   06/12/23 1940 111/64 37.3 ?C (99.1 ?F) Oral 98 20 95 % --     Critical Labs: n/a  Is the patient positive for severe sepsis/septic shock screen?: No    Review of Discharge Planning  Plan/goals: Pulmonary hygiene, diuresis, steroids, fall/aspiration precautions, ECHO  Anticipated Date of Discharge: 06/14/2023    Anticipated Disposition: Home   Pending BOOST interventions? Yes, please see BOOST Note under Interdisciplinary Notes tab  Discharge support person: Family: daughter  Update to support person: Support person will visit daily  Barriers:None     Patient Education  New meds (past 24 hrs): none    Does patient meet criteria to watch Fall Prevention Video? If patient doesn't meet criteria, please indicate why. Yes   If patient meets criteria, date patient watched fall prevention video: June 12, 2023    High Risk Medication Teachback: Done  Other teaching: POC, medications, sleep plan

## 2023-06-13 NOTE — Consults
 CLINICAL SOCIAL WORKER PROGRESS NOTE      Admit Date:06/11/2023    Date of CSW Assessment: 06/13/2023     REFERRAL INFORMATION:   Date Seen: 06/13/23  Date Referred: 06/13/23  Time In: 1555  Time Out: 1615  Referral Source: MSQ, MD  Reason for Referral: Advance Care Planning, Patient Education  Source of Information: Consultation, Review of medical record, Persons interviewed  Consult Source: MD  Interview Source(s): Patient [name])  Does the patient have a Family/Support System member participating in Discharge Planning?: Yes  Family/Support System in agreement with the current discharge plan: Yes, in agreement and participating     06/13/23 1552   MSQ   MSQ Status discussed with Multidisciplinary Team Yes   MSQ Needs Addressed? Yes   MSQ Status Discussed with Patient/Family;Advance Care Planning initiated;Chart Review complete/all components filed       CLINICAL ASSESSMENT:     [name] is a 88 y.o. White female who was referred by the physician to the CSW for an MSQ referral. The pt admitted to the hospital for heart failure with reduced injection fraction. CSW reviewed the pt's chart and the pt did not have AD/POLST on file. CSW educated the pt about the importance of an AD and encouraged the pt to complete one. The pt was understanding of the information. CSW remains available for continued support and needs during the pt's hospitalization.     Interventions: Provided education regarding, Discussed role of CSW and informed CSW availability for support and resources, Discussed resources available, Provided concrete resource assistance  Outcome: CSW received a referral for the pt for a MSQ referral. CSW reviewed the pt's chart and visited the pt at the bedside.  Plan/Recommendations: Discharge to Home    Interdisciplinary rounds were conducted with the multidisciplinary team including the clinical social worker and nurse case Production designer, theatre/television/film. The patient's plan of care and discharge planning were discussed and formulated based on the patient's specific needs.    Projected Date of Discharge: 06/13/2023    Weyman [place], MSW  Clinical Social Worker II - Per Lucianne Rutherford [facility]  4:03 PM  06/13/2023

## 2023-06-13 NOTE — Progress Notes
 Pharmaceutical Services - Discharge Medication Reconciliation Note    Patient Name: [name]  Medical Record Number: [id number]  Admit date: 06/11/2023 10:11 PM    Age: 88 y.o.  Sex: female  Allergies: Beta adrenergic blockers, Levofloxacin, Macrolides and ketolides, Sulfa antibiotics, Botulinum toxins, Pollen extract, Statins, and Plastic tape    Discharge Medication List:     Changes To My Medications        CHANGE how you take these medications        Dose Instructions Notes   fluticasone -vilanterol 100-25 mcg/inh inhaler  Commonly known as: BREO ELLIPTA   What changed: Another medication with the same name was removed. Continue taking this medication, and follow the directions you see here.   1 puff   Inhale 1 puff daily.      predniSONE  20 mg tablet  Start taking on: June 14, 2023  What changed:   how much to take  how to take this  when to take this  additional instructions   40 mg   Take 2 tablets (40 mg total) by mouth daily for 2 days.             CONTINUE taking these medications        Dose Instructions Notes   Albuterol  Sulfate Aepb    Takes 1 to 2 puffs every 4 hours as needed for wheezing or SOB Inhale Takes 1 to 2 puffs every 4 hours as needed for wheezing or SOB .      aspirin  81 mg EC tablet   81 mg   Take 1 tablet (81 mg total) by mouth daily.      azaTHIOprine  50 mg tablet  Commonly known as: Imuran    75 mg   Take one and one-half tablet (75 mg total) by mouth two (2) times daily.      clonazePAM  1 mg tablet  Commonly known as: KlonoPIN     TAKE 1.5 TABLET BY MOUTH AT NIGHT      DEXILANT  60 MG DR capsule  Generic drug: dexlansoprazole    60 mg   Take 1 capsule (60 mg total) by mouth daily.      DULoxetine  20 mg DR capsule  Commonly known as: Cymbalta    60 mg   TAKE 3 CAPSULES (60 MG TOTAL) BY MOUTH DAILY      EPINEPHrine 0.3 mg/0.3 mL auto-injector  Commonly known as: EpiPen   0.3 mg   Inject 0.3 mLs (0.3 mg total) into the muscle as needed for for Anaphylaxis.      exemestane  25 mg tablet  Commonly known as: Aromasin    25 mg   Take 1 tablet (25 mg total) by mouth daily.      FARXIGA  10 MG tablet  Generic drug: dapagliflozin    10 mg   Take 1 tablet (10 mg total) by mouth daily      losartan  100 mg tablet  Commonly known as: Cozaar    100 mg   Take 1 tablet (100 mg total) by mouth daily.      montelukast  10 mg tablet  Commonly known as: Singulair    10 mg   Take 1 tablet (10 mg total) by mouth every evening.      penicillin  V potassium 500 mg tablet   500 mg   Take 1 tablet (500 mg total) by mouth two (2) times daily.      pyRIDostigmine  60 mg tablet  Commonly known as: Mestinon    60 mg   Take 1 tablet (60  mg total) by mouth three (3) times daily.      REPATHA SURECLICK 140 MG/ML Soaj  Generic drug: Evolocumab   1 pen.   Inject 1 pen. under the skin every fourteen (14) days.      tiotropium 18 mcg/act inhalation capsule  Commonly known as: Spiriva   18 mcg   Place 1 capsule (18 mcg total) into inhaler and inhale daily.      WELLBUTRIN SR 200 MG 12 hr tablet  Generic drug: buPROPion (SR)   200 mg   TAKE 1 TABLET (200 MG TOTAL) BY MOUTH TWO (2) TIMES DAILY             STOP taking these medications        STOP taking these medications   methenamine hippurate 1 g tablet  Commonly known as: Hiprex      trimethoprim 100 mg tablet  Commonly known as: Trimpex                Prescriptions        These medications were sent to [facility] [place], [place] - [address] [address] [place], [place]. A  [address] [place] [address]      Phone: [phone]   predniSONE 20 mg tablet           Reconciliation  I have reviewed and reconciled the discharge medication orders with both inpatient orders and PTA medication list. No issues requiring follow up at this time.    Of note, admission medication reconciliation was not reviewed by pharmacy at time of discharge and discrepancies in home medication may exist. Discharge medication list updated as able.    Chancey Combe, PharmD, BCPS  Clinical Pharmacist  [phone]    06/13/2023, 2:12 PM
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
## 2023-06-13 NOTE — Other
 Patient's Clinical Goal:   Clinical Goal(s) for the Shift: VSS, free from respiratory distre, no falls/injuries, comfort, safety  Identify possible barriers to advancing the care plan:   Stability of the patient: Moderately Unstable - medium risk of patient condition declining or worsening    Progression of Patient's Clinical Goal:     Rec'ed 88 y/o pt in bed, DNR, Bmat 3    No acute events during shift  PT A&Ox4  Tolerating diet well with no n/v  Strict I/o's provided, pt on 1800 ml f/r  RT page to provided breathing tx, noted pt was wheezing  No c/o pain during shift  Satting well on RA 90-95%, no signs of respiratory distress during shift  BM this morning per report and per pt during shift, pt had several loose BM,  IS use encouraged, teaching provided  primafit in place w/ Voiding clear yellow urine.   ECHO tech and NP aware of pending TTE   Pt  ambulating to restroom with steady gait.  Educated on all medication admin and s/e. explained risks vs benefits of refused meds  Bed locked and in low position.   All needs met. Call light within reach.   All safety precautions maintained. Pt free from harm  Encouraged and educated on use of call light to make all needs known  Per MD Alita Irwin order d/c IV sites, d/c instructions reviewed with pt.   Pending D/C, waiting for home oxygen arrival. Care giver bedside  1900 report given to oncoming RN Loetta Ringer

## 2023-06-13 NOTE — Discharge Summary
 Discharge Summary   Name: [name] MRN: [id number] DOB: [birth year]   Admit Date: 06/11/2023 D/C Date:    LOS:   2 days    Admit Attending: Elam Gray. Shannon Darter, MD Discharge Attending: Arzella Bitters., MD    PCP: Volney Grumbles., MD Discharge Provider: Herb Loges. Alita Irwin, MD     Inpatient Care Team/ Consults:    Geriatrics  Outpatient Care Team  No care team member to display     Admission Diagnosis:  (reason for admission)  SOB Discharge Diagnosis:  (conditions treated during hospitalization)  COPD  MG  ADHF  Breast CA  DM  CKD   Disposition:  Home or Self care      Discharge Condition:  stable       HPI:   [name] is a 88 y.o. 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   Hospital Course:    Patient admitted for SOB and found to be volume overloaded. Overload secondary to increased fluid intake at OSH and at home. Noted to not be on diuretics scheduled as had not needed it in the past. Patient was also wheezing indicating a COPD exacerbation. Started on steroids, diuretics, and breathing treatments. PT/OT evaluated the patient and was stable to discharge home. Symptoms improved with treatment. Also noted to have chronic diarrhea secondary to medications so was given home imodium .     Plan to discharge patient to home with outpatient follow up and TTE scheduled      Outpatient Provider To-Do List  - Follow up with PCP about symptoms  - Follow up with cardiologist for TTE and eval        LACE+ Score: 65 (06/13/23 0001) High Risk of Readmission        Pending Labs   The following tests have been collected, but not yet resulted at the time of discharge.    Unresulted Labs (From admission, onward)      None          The following labs have a preliminary result at the time of discharge. Please check back if the final results have changed.  Preliminary Resulted Labs (From admission, onward)      None          Discharge Medication List  Coumadin and Opiate Risk Assessment   This patient does not have coumadin on their discharge med list  Controlled meds:   Controlled Medications       Anticonvulsants - Benzodiazepines Disp Start End     clonazePAM  1 mg tablet 135 tablet 04/17/2023 --    Sig: TAKE 1.5 TABLET BY MOUTH AT NIGHT              Changes To My Medications      ASK your doctor about these medications     Albuterol  Sulfate Aepb; Takes 1 to 2 puffs every 4 hours as needed for   wheezing or SOB Inhale Takes 1 to 2 puffs every 4 hours as needed for   wheezing or SOB .; Quantity: 3 each; Refills: 3   aspirin  81 mg EC tablet; Dose: 81 mg; Take 1 tablet (81 mg total) by   mouth daily.; Quantity: 90 tablet; Refills: 3   azaTHIOprine  50 mg tablet; Dose: 75 mg; Take one and one-half tablet (75  mg total) by mouth two (2) times daily.; Quantity: 270 tablet; Refills: 3;   Commonly known as: Imuran   clonazePAM 1 mg tablet; TAKE 1.5 TABLET BY MOUTH AT NIGHT; Quantity: 135   tablet; Refills: 0; Commonly known as: KlonoPIN   DEXILANT 60 MG DR capsule; Dose: 60 mg; Take 1 capsule (60 mg total) by   mouth daily.; Quantity: 90 capsule; Refills: 3; Generic drug:   dexlansoprazole   DULoxetine 20 mg DR capsule; Dose: 60 mg; TAKE 3 CAPSULES (60 MG TOTAL)   BY MOUTH DAILY; Quantity: 270 capsule; Refills: 3; Commonly known as:   Cymbalta   EPINEPHrine 0.3 mg/0.3 mL auto-injector; Refills: 0; Commonly known as:   EpiPen   exemestane 25 mg tablet; Dose: 25 mg; Take 1 tablet (25 mg total) by   mouth daily.; Quantity: 90 tablet; Refills: 3; Commonly known as: Aromasin   FARXIGA 10 MG tablet; Dose: 10 mg; Take 1 tablet (10 mg total) by mouth   daily; Quantity: 90 tablet; Refills: 3; Generic drug: dapagliflozin   * fluticasone-vilanterol 100-25 mcg/inh inhaler; Dose: 1 puff; Inhale 1   puff daily.; Quantity: 180 each; Refills: 3; Commonly known as: BREO   ELLIPTA   * fluticasone-vilanterol 100-25 mcg/act inhaler; Dose: 1 puff; Inhale 1   puff daily.; Quantity: 180 each; Refills: 3; Commonly known as: BREO   ELLIPTA   losartan 100 mg tablet; Dose: 100 mg; Take 1 tablet (100 mg total) by   mouth daily.; Quantity: 90 tablet; Refills: 3; Commonly known as: Cozaar   methenamine hippurate 1 g tablet; take one tablet by mouth two times a   day; Quantity: 120 tablet; Refills: 0; Commonly known as: Hiprex   montelukast 10 mg tablet; Dose: 10 mg; Take 1 tablet (10 mg total) by   mouth as needed for.; Refills: 0; Commonly known as: Singulair   penicillin V potassium 500 mg tablet; Dose: 500 mg; Take 1 tablet (500   mg total) by mouth two (2) times daily.; Quantity: 120 tablet; Refills: 2   predniSONE 20 mg tablet; One daily for 5 days.; Quantity: 5 tablet;   Refills: 0   pyRIDostigmine 60 mg tablet; Dose: 60 mg; Take 1 tablet (60 mg total) by   mouth three (3) times daily.; Quantity: 270 tablet; Refills: 4; Commonly   known as: Mestinon   REPATHA SURECLICK 140 MG/ML Soaj; Dose: 1 pen.; Inject 1 pen. under the   skin every fourteen (14) days.; Quantity: 6 mL; Refills: 6; Generic drug:   Evolocumab   tiotropium 18 mcg/act inhalation capsule; Dose: 18 mcg; Place 1 capsule   (18 mcg total) into inhaler and inhale daily.; Quantity: 90 capsule;   Refills: 3; Commonly known as: Spiriva   trimethoprim 100 mg tablet; Dose: 100 mg; Take 1 tablet (100 mg total)   by mouth daily.; Quantity: 100 tablet; Refills: 3; Commonly known as:   Trimpex   WELLBUTRIN SR 200 MG 12 hr tablet; Dose: 200 mg; TAKE 1 TABLET (200 MG   TOTAL) BY MOUTH TWO (2) TIMES DAILY; Quantity: 180 tablet; Refills: 3;   Generic drug: buPROPion (SR)  * This list has 2 medication(s) that are the same as other medications   prescribed for you. Read the directions carefully, and ask your doctor or   other care provider to review them with you.      Requested Appointments  There are no active discharge follow-up orders for this encounter.     Scheduled Appointments  Future Appointments  Date Time Provider Department Center   06/20/2023 10:30 AM Jaqueline Mering, MD [facility] [address] [place]/Cen   06/27/2023 11:00 AM SM 2020 ECHO 03 CARDIOLOGY CARDIAC IMAGING CI [id number] [facility]   06/27/2023  1:00 PM 2020 2ND FLOOR, LAB PATH [id number] [facility]   06/27/2023  1:15 PM June Old, MD CRD DIS 2020 [facility]   07/24/2023 11:40 AM Fairy Homer., MD HEM/ONC [address]   07/25/2023  8:40 AM Volney Grumbles., MD GERI IMS [address] [place]/Cen          Discharge Physical Exam/Labs/Imaging/Procedures     Discharge Physical Exam:   BP 122/69 (BP Location: Left arm, Patient Position: Lying)  ~ Pulse 83  ~ Temp 36.9 ?C (98.5 ?F) (Oral)  ~ Resp 20  ~ Ht 1.651 m (5' 5'')  ~ Wt 64 kg (141 lb 3.2 oz)  ~ SpO2 95%  ~ BMI 23.50 kg/m?   General appearance: alert, appears stated age, and cooperative  Neck: no adenopathy, no carotid bruit, no JVD, supple, symmetrical, trachea midline, and thyroid not enlarged, symmetric, no tenderness/mass/nodules  Lungs: clear to auscultation bilaterally  Heart: regular rate and rhythm, S1, S2 normal, no murmur, click, rub or gallop  Abdomen: soft, non-tender; bowel sounds normal; no masses,  no organomegaly  Extremities: extremities normal, atraumatic, no cyanosis or edema  Skin: Skin color, texture, turgor normal. No rashes or lesions  Neurologic: Grossly normal     Relevant labs:     CBC:  Lab Results   Component Value Date    WBC 13.28 (H) 06/13/2023    HGB 13.2 06/13/2023    HCT 41.6 06/13/2023    PLT 357 06/13/2023     LFT:  Lab Results   Component Value Date    TOTPRO 7.2 06/12/2023    ALBUMIN 4.0 06/12/2023    BILITOT 0.2 06/12/2023    ALKPHOS 95 06/12/2023    AST 23 06/12/2023    ALT 14 06/12/2023       BMP:  Lab Results   Component Value Date    NA 139 06/13/2023    K 4.0 06/13/2023    CL 105 06/13/2023    CO2 25 06/13/2023    BUN 26 (H) 06/13/2023    CREAT 1.47 (H) 06/13/2023 CALCIUM  9.2 06/13/2023    GLUCOSE 122 (H) 06/13/2023       Cal/iCal/MG/Ph:  Lab Results   Component Value Date    CALCIUM  9.2 06/13/2023    MG 2.2 (H) 06/13/2023    PHOS 3.1 06/13/2023     Coags:  Lab Results   Component Value Date    PT 11.7 06/12/2023    INR 0.9 06/12/2023             None      Relevant Imaging Studies (most recent results only):   Last CXR: No CXRs in the last 180 days    Procedures & Operations Performed:   As above          Nutrition Recommendations & Malnutrition Assessment   The patient was not consulted or assessed by a Registered Dietitian (RD) during this admission.         Discharge Diet Orders: There are no active discharge diet orders for this encounter.   Skin/Wound         Discharge Activity Orders   There are no active discharge activity orders for this encounter.   Rehab Assessment   No PT evaluation this encounter  No OT evaluation  this encounter      Case Manager/Home Health Assessment   Discharge Information  Discharge Address: [address], [place], [place] (06/12/23 0818)              Home Health Orders  There are no active discharge home health orders for this encounter.   Goals of Care Note (if new for this encounter)    Upon admission history and physical, patient A&Ox4, lucid, understanding of current medical situation. Confirmed DNR/SNI status. Has an advanced directive and POLSE since 2020 to this effect.   DNR/DNI order placed.  To be discussed with attending, Dr. Shannon Darter, in AM.  Michal Agar, MD  PGY-2 [facility] Internal Medicine      I have seen and examined the patient on their discharge day. I have reviewed, edited, and agree with the above discharge summary.  Herb Loges. Alita Irwin, MD  06/13/2023 7:20 AM    --------------------------------------------------  ATTENDING ADDENDUM:    I saw and examined the patient on the date of discharge. I have reviewed and agree with Dr. Joseph Nickel note which documents the findings and discharge plans that we formulated together. Any addendum noted above inline and as follows.      Patient doing very well today, respiratory status normal, showered last night, O2 to bedside    Greater than 35 minutes were spent on the day of discharge in active coordination of care for discharge planning, including counseling the patient and updating the PCP on patient's discharge plans.    Signed: Ameirah Khatoon D. Destan Franchini, MD  06/14/2023
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
## 2023-06-14 ENCOUNTER — Other Ambulatory Visit: Payer: BLUE CROSS/BLUE SHIELD

## 2023-06-14 ENCOUNTER — Telehealth: Payer: BLUE CROSS/BLUE SHIELD

## 2023-06-14 DIAGNOSIS — J9611 Chronic respiratory failure with hypoxia: Secondary | ICD-10-CM

## 2023-06-14 LAB — Magnesium: MAGNESIUM: 2.1 meq/L — ABNORMAL HIGH (ref 1.4–1.9)

## 2023-06-14 LAB — Basic Metabolic Panel
ANION GAP: 12 mmol/L (ref 8–19)
ANION GAP: 12 mmol/L (ref 8–19)

## 2023-06-14 LAB — Differential Automated: BASOPHIL PERCENT, AUTO: 0.1 % (ref 1.80–6.90)

## 2023-06-14 LAB — Phosphorus: PHOSPHORUS: 3.5 mg/dL (ref 2.5–4.5)

## 2023-06-14 LAB — CBC: MEAN PLATELET VOLUME: 10.5 fL (ref 9.3–13.0)

## 2023-06-14 LAB — Blood Gases, venous,POC: O2 SAT/MEASURED,VENOUS,POC: 51.5 % (ref 37–65)

## 2023-06-14 MED ADMIN — IPRATROPIUM-ALBUTEROL 0.5-2.5 (3) MG/3ML IN SOLN: 3 mL | RESPIRATORY_TRACT | @ 17:00:00 | Stop: 2023-06-15 | NDC 60687040579

## 2023-06-14 MED ADMIN — DULOXETINE HCL 60 MG PO CPEP: 60 mg | ORAL | @ 16:00:00 | Stop: 2023-06-15 | NDC 60687074511

## 2023-06-14 MED ADMIN — PANTOPRAZOLE SODIUM 40 MG PO TBEC: 40 mg | ORAL | @ 16:00:00 | Stop: 2023-06-15 | NDC 50268063911

## 2023-06-14 MED ADMIN — BUPROPION HCL ER (SR) 200 MG PO TB12: 200 mg | ORAL | @ 04:00:00 | Stop: 2023-06-15 | NDC 00173072200

## 2023-06-14 MED ADMIN — LOPERAMIDE HCL 1 MG/7.5ML PO SOLN: 2 mg | ORAL | @ 16:00:00 | Stop: 2023-06-15 | NDC 68094002959

## 2023-06-14 MED ADMIN — AZATHIOPRINE 50 MG PO TABS: 75 mg | ORAL | @ 04:00:00 | Stop: 2023-06-15 | NDC 68084022901

## 2023-06-14 MED ADMIN — HEPARIN SODIUM (PORCINE) 5000 UNIT/ML IJ SOLN: 5000 [IU] | SUBCUTANEOUS | @ 16:00:00 | Stop: 2023-06-15 | NDC 00409272330

## 2023-06-14 MED ADMIN — PYRIDOSTIGMINE BROMIDE 60 MG PO TABS: 60 mg | ORAL | @ 04:00:00 | Stop: 2023-06-15 | NDC 68084049411

## 2023-06-14 MED ADMIN — DAPAGLIFLOZIN PROPANEDIOL 10 MG PO TABS: 10 mg | ORAL | @ 16:00:00 | Stop: 2023-06-15 | NDC 00310621039

## 2023-06-14 MED ADMIN — AZATHIOPRINE 50 MG PO TABS: 75 mg | ORAL | @ 16:00:00 | Stop: 2023-06-15 | NDC 68084022901

## 2023-06-14 MED ADMIN — BUPROPION HCL ER (SR) 200 MG PO TB12: 200 mg | ORAL | @ 17:00:00 | Stop: 2023-06-15 | NDC 00173072200

## 2023-06-14 MED ADMIN — HEPARIN SODIUM (PORCINE) 5000 UNIT/ML IJ SOLN: 5000 [IU] | SUBCUTANEOUS | @ 07:00:00 | Stop: 2023-06-15 | NDC 00409272330

## 2023-06-14 MED ADMIN — LOSARTAN POTASSIUM 50 MG PO TABS: 100 mg | ORAL | @ 16:00:00 | Stop: 2023-06-15 | NDC 68084034711

## 2023-06-14 MED ADMIN — PYRIDOSTIGMINE BROMIDE 60 MG PO TABS: 60 mg | ORAL | @ 19:00:00 | Stop: 2023-06-15 | NDC 68084049411

## 2023-06-14 MED ADMIN — ASPIRIN 81 MG PO TBEC: 81 mg | ORAL | @ 16:00:00 | Stop: 2023-06-15 | NDC 71399862701

## 2023-06-14 MED ADMIN — PYRIDOSTIGMINE BROMIDE 60 MG PO TABS: 60 mg | ORAL | @ 14:00:00 | Stop: 2023-06-15 | NDC 68084049411

## 2023-06-14 MED ADMIN — HEPARIN SODIUM (PORCINE) 5000 UNIT/ML IJ SOLN: 5000 [IU] | SUBCUTANEOUS | Stop: 2023-06-15 | NDC 00409272330

## 2023-06-14 MED ADMIN — MONTELUKAST SODIUM 10 MG PO TABS: 10 mg | ORAL | Stop: 2023-06-15 | NDC 00904680861

## 2023-06-14 MED ADMIN — PREDNISONE 20 MG PO TABS: 40 mg | ORAL | @ 16:00:00 | Stop: 2023-06-15 | NDC 60687014511

## 2023-06-14 NOTE — Nursing Note
 [name] x4 , Ambulates in room with supervision,  Bed alarm on, room air, Expiratory wheezing , Received RT treatment, VSS, tolerated PO, BM x2. Discharge instruction provided,   1000 awaiting concentrator delivery. Discharge instruction provided.   1520 Concentrator delivered , hand off to [place] ,Charity fundraiser ( break Relief )       1620 Kaitlyn was discharged while this Clinical research associate on break , Per break relief RN, Teaching on how to use the home oxygen provided by Rep. Per RN , concentrator lowest dose was set at 2L and not going lower, and CM was aware.   1625 Texted Dr Alita Irwin on care connect  and relayed the information regarding concentrator lowest dose.

## 2023-06-14 NOTE — Progress Notes
 Aaron Aas  GERIATRICS PROGRESS NOTE   Patient: [name]  MRN: [id number]  Date of Service: 06/14/2023  Admission Date: 06/11/2023    Hospital Day: 3  PCP: Volney Grumbles., MD  Attending Physician: Arzella Bitters., MD  Primary Resident: Herb Loges. Alita Irwin, MD    CHIEF COMPLAINT   Shortness of Breath (For past week, wheezing on scene 93% RA, given albuterol treatment. Hx of copd/chf. )    SUBJECTIVE/INTERVAL EVENTS   NAEON. Patient doing better this AM status post lasix given with steroids.       Patient is a [x]  reliable []  unreliable/limited historian.   If patient is deemed unreliable/limited, main reason(s) is/are []  altered mental status []  cognitive impairment and additional information was obtained from the following independent historian(s):     I reviewed these specialist notes that directly relate to the patient's acute and/or chronic medical problems with documentation of the salient findings, if any:   Last Cardiology Note    No notes exist for this encounter.         GERIATRIC ASSESSMENT     Geriatric-Specific Review of Systems:  Falls: None  Nutrition: NA  Weight Loss: NA  Swallowing: NA  Dentures: None  Baseline Cognition: AOX4, currently at baseline   Incontinence: None  Constipation: None  Depression/Anxiety: On home medications  Hearing: None  Vision: None     Social History:  With whom does the patient live:     []  alone          [x]  adult child:   []  spouse or partner       []  other, specify:     Which best describes the patient's residence:    []  single family house    [x]  condo/apartment    []  board and care (B&C) or assisted living facility(ALF)  []  memory unit at ALF   []  nursing home   []  independent senior housing  []  need to navigate stairs?     Caregiver:    []  employed caregiver; if yes, indicate days/hours available:   []  family caregiver; if yes, indicate days/hours available:   []  none    Occupation/past occupation:     Functional Status (prior to acute illness):   Source/s of Information: [x] Patient  [] Other:              Relationship:     MOBILITY    Walking across the room [] unaided    [] cane              [x] walker [] handheld    [] wheelchair [] motorized scooter [] unable    Climbing a flight of stairs [] independent  [x] needs help [] unable [] n/a   ACTIVITIES OF DAILY LIVING Independent Needs help Who Helps     Feeding oneself [x]  []       Transferring  (getting from bed to chair) []  [x]       Getting to the toilet []  [x]       Toileting  [x]  []       Getting dressed [x]  []       Bathing or showering []  [x]     INSTRUMENTAL ACTIVITIES OF DAILY LIVING        Using the telephone [x]  []       Taking medications [x]  []       Preparing meals [x]  []       Managing money (paying bills, taxes, banking) []  [x]       Moderately strenuous housework  [x]  []       Doing laundry [x]  []   Shopping for personal items or groceries [x]  []       Driving [x]  []       Getting to places beyond walking distance    (taking a bus, cab, or car) [x]  []       MEDICATIONS       Allergies:  Beta Adrenergic Blockers  Levofloxacin  Macrolides And Ketolides  Sulfa Antibiotics  Botulinum Toxins  Pollen Extract  Statins  Plastic Tape    Home Medications:  No outpatient medications have been marked as taking for the 06/11/23 encounter [facility] Encounter).       Current Medications:  Scheduled:  aspirin, 81 mg, Oral, Daily  azaTHIOprine, 75 mg, Oral, BID  buPROPion (SR), 200 mg, Oral, Q12H  dapagliflozin, 10 mg, Oral, Daily  DULoxetine, 60 mg, Oral, Daily  heparin, 5,000 Units, Subcutaneous, Q8H  losartan, 100 mg, Oral, Daily  montelukast, 10 mg, Oral, QPM  pantoprazole, 40 mg, Oral, Daily  predniSONE, 40 mg, Oral, Daily  pyRIDostigmine, 60 mg, Oral, TID    PRN:  calcium carbonate, clonazePAM, ipratropium-albuterol, loperamide  Medications 06/13/23 06/14/23     calcium carbonate chew tab 500 mg  Dose: 500 mg  Freq: 3 times daily PRN Route: PO  PRN Reasons: Heartburn,Indigestion  Start: 06/12/23 1740 End: 07/12/23 1739          clonazePAM tab 1.5 mg  Dose: 1.5 mg  Freq: Daily PRN Route: PO  PRN Reason: Anxiety  Start: 06/12/23 0426 End: 07/12/23 0425          ipratropium-albuterol 0.5-2.5 mg/3 mL inh soln 3 mL  Dose: 3 mL  Freq: 4 times daily PRN Route: NEBULIZATION  PRN Reasons: Wheezing,Shortness of Breath  Start: 06/12/23 0251 End: 06/19/23 0250      0915-Given               loperamide 1 mg/7.5 mL soln 2 mg  Dose: 2 mg  Freq: 4 times daily PRN Route: PO  PRN Reason: Diarrhea  Start: 06/13/23 1051 End: 07/13/23 1050      1115-Given                          Infusions:         OBJECTIVE     Temp:  [36.2 ?C (97.2 ?F)-36.9 ?C (98.4 ?F)] 36.9 ?C (98.4 ?F)  Heart Rate:  [79-97] 95  Resp:  [15-18] 18  BP: (116-128)/(53-66) 118/66  NBP Mean:  [71-83] 83  SpO2:  [87 %-95 %] 92 %       IN'S AND OUT'S:  UOP: Urine:  [25 mL-300 mL] 150 mL   I/Os:   Intake/Output Summary (Last 24 hours) at 06/14/2023 0710  Last data filed at 06/14/2023 0645  Gross per 24 hour   Intake 540 ml   Output 925 ml   Net -385 ml             System Check if within normal limits Positive or additional negative findings   Constit  [x]  General appearance     Eyes  [x]  Conj/Lids  []  Pupils   []  Fundi     HENMT  []  External ears/nose    []  Gross Hearing   []  Lips/teeth/gums    []  Mucus membranes  []  Otoscopy     []  Nasal mucosa   []  Oropharynx    []  Head       Neck  []  Inspection/palpation   []  Thyroid     Resp  []   Effort    []  Auscultation    []  Wheezing   []  Crackles  Decreased breathe sounds in bilateral bases. Audible wheezing heard, none on auscultation       CV  [x]  Rhythm/rate   [x]  Murmurs   [x]  LE edema   []  JVP non-elevated     Normal pulses:   []  Radial            []  Femoral       []  Pedal    Breast  []  Inspection []  Palpation     GI  []  Bowel sounds    []  Soft   [x]  Nontender   []  Nondistended   []  Rebound/Guarding   []  Liver/spleen   []  Rectal     GU  M:    []  Scrotum    []  Penis   []  Prostate  []  Foley catheter in place   []  Suprapubic tube in place     F:     []  External   []  Cervix []  Uterus       []  Vaginal wall  []  Mucus  []  Adnexa    Lymph  []  Neck  []  Axillae []  Groin     MSK Specify site examined:         [x]  Inspect/palp   []  Stability   []  Gait        [x]  ROM   [x]  Strength/tone   []  Back    Skin  []  Inspection   []  Palpation     Neuro  [x]  CN2-12 intact grossly   [x]  Alert and oriented   []  DTR      []  Muscle strength      []  Sensation   []  Balance     Psych  [x]  Insight/judgement     []  Mood/affect    [x]  Gross cognition          CAM DELIRIUM TEST:    Feature One:   A. Acute Change in mental status from baseline? []  yes [x]  no   B. Is there fluctuation in the mental status? []  yes [x]  no    Feature Two:  A. Does that patient have difficulty focusing attention?  []  yes [x]  no    Feature Three:  A.Is there evidence of disorganized thinking?  []  yes [x]  no    Feature Four:  A. Does the patient have an altered level of consciousness (ie: vigilant, lethargic, stuporous, comatous?  []  yes [x]  no    The diagnosis of delirium by CAM requires the presence of features 1 and 2 and either 3 or 4.     LABS: Labs reviewed today  CBC  Recent Labs     06/14/23  0340 06/13/23  0542 06/12/23  0409   WBC 11.45* 13.28* 7.49   HGB 13.7 13.2 13.8   HCT 43.8 41.6 43.8   MCV 98.4 96.3 96.9   PLT 344 357 357     BMP  Recent Labs     06/14/23  0340 06/13/23  0542 06/12/23  1323 06/12/23  0409   NA 140 139 137 140   K 4.2 4.0 4.3 4.8   CL 102 105 102 98   CO2 26 25 24 23    BUN 24* 26* 23* 21   CREAT 1.44* 1.47* 1.43* 1.43*   CALCIUM 9.3 9.2 9.1 9.3   MG 2.1* 2.2* 2.0* 2.1*   PHOS 3.5 3.1  --  2.8     LFT  Recent Labs  06/12/23  0409   TOTPRO 7.2   ALBUMIN 4.0   BILITOT 0.2   BILICON <0.2   ALT 14   AST 23   ALKPHOS 95     Coags  Recent Labs     06/12/23  0409   INR 0.9   PT 11.7     UA:  No results for input(s): ''SPECGRAVUR'', ''PHUR'', ''BLDUR'', ''KETONESUR'', ''GLUCOSEUR'', ''PROTCLUR'', ''NITRITEUR'', ''LEUKESTUR'', ''RBCSUR'', ''WBCSUR'' in the last 72 hours.        IMAGING: Imaging reviewed today    No imaging has been resulted in the last 24 hours    MICROBIOLOGY:    No results found for this or any previous visit (from the past 24 hours).      INDEPENDENT INTERPRETATION OF TESTS:        ASSESSMENT AND PLAN       Zulma Court is a 88 y.o. 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         Acute Problems:  #AECOPD  GOLD class E. Wheeze, worsening hypoxemia, dyspnea. No sputum production or constitutional infectious symptoms, thus no indication for ABx. Will manage as bronchodilators and steroids .  - prednisone 40mg  PO daily x5 days (4/15-4/19)   - duonebs PRN  - held Breo-elipta as not on formulary  - Continue montelukast   - spO2 gaol 88-92%     #Acute decompensated heart failure  #Acute myocardial injury  #Elevated BNP   #HFimpEF (EF 35-40%, improved to 40-45% in 4/24)  #CAD #Aortic Calcification  #HTN  - Etiology: likely ischemic based on risk factors     Diagnosis  - Ischemic evaluation if within GOC  - repeat formal TTE  - POCUS: Grossly reduced EF, IVC 2.1cm with <50% of respiratory variation, RV slightly enlarged      Treatment  Preload:  - Diuresis: lasix 40mg  IV 1x, will trial on 20mg  IV given lasix naive. (varified with pharmacist that this is low risk for ototoxicity iso known previous sulfa ototoxicity)  - Goal: Net negative 1 to 1.5L  - Monitoring: daily wieghts, BMP/Mg q12, strict I/O, 1.8L fluid restriction     Inotropic support:not indcated     Afterload/GDMT:  - ACE/ARB/ARNi: continue home losartan 100mg  PO daily  - Beta-blockade: Has an allergy   - MRA: unlikely to tolerate with known renal insufficiency   - SGLT2: continue      Valves: f/u repeat TTE     Rhythm: sinus at admission     Coronaries: continue ASA, previously statin intolerant      Devices: none     Transplant: not applicable Chronic Problems:  #Myesthenia Gravis  #Immunosupressed status  #Cervical myelopathy  Patient without new focal weakness, bulbar symptoms, or worsening hypercapnia at admission.   Continue home azathioprine 75mg  PO BID  - Continue pyridostigmine 60mg  PO daily  - Neurology f/u at discharge      #CKD stage 3b to 4  Bl Cr 1.5 to 1.7. At baseline. No dialysis indications  - Trend BMP  - Avoid nephrotoxic meds     #Bilateral breast cancer - invasive ductal carcinoma with lobular features ER (+)/ HER 2 (-)   Dx 2019. Has not undergone surgery. On medications as below.  - HELD exemestane 25 mg tablet as not formulary  -  As patient to bring this medication      #Major Depression  #Anxiety  #Benzodiazepine dependence  - Reordered clonazepam 1.5mg  PO daily PRN for anxiety (reordered as patient known to have dependence)  - Continue bupropion 60mg  PO daily      #Deconditioning  - PT and OT while admitted     #GERD  S/p Nisan fundoplication  - Continue pantoprazole 40mg  PO daily     #IBS  #Interstitial cystitis  #Bilateral breast cancer  #OA knees  #Fibromyalgia  #deafness in one ear  #Statin intolerance    Prophylaxis:   VTE prophylaxis: subcutaneous heparin  GI prophylaxis: Pantoprazole     CODE STATUS: Ambulatory DNR    Current Ambulatory Status & Fall Risk:  []  non-ambulatory  [x]  walks with cane and/or walker  []  walks independently  []  walks independently but may benefit from an assistive device evaluation with physical therapy  []  needs supervision with walking secondary to poor safety awareness    SDoH  The diagnosis or treatment of said conditions is significantly limited by the following social determinants of health:  []  Z59.0 Homelessness  []  Z59.1 Inadequate housing  []  Z60.2 Problems related to living alone  []  Z59.7 Insufficient social insurance and welfare support  []  Z59.9 Problems related to housing and economic circumstances, unspecified  []  Z62.820 Parent-biological child conflict  []  Z63.8 Other specified problems related to primary support group  []  Z55.0 Illiteracy and low level literacy     Discharge Planning Considerations:  [x]  Physical Therapy (PT)/Occupational Therapy (OT) consult inpatient to evaluate for Durable Medical Equipment (DME) and other adaptive home equipment recommendations, or to assess if appropriate for Skilled Nursing Facility (SNF) or Acute Rehab Unit (ARU) for short-term rehabilitation  []  Social Work consult to evaluate home support, caregiver resources, social barriers to discharge, or if appropriate, for custodial/long-term nursing home (NH) placement or new Assisted Living Facility (ALF) or Board and Care (B&C) placement, or non-medical transport upon discharge  [x]  Case Management consult for Home Health needs, home IV antibiotics, above Durable Medical Equipment (DMEs), Skilled Nursing Facility (SNF) placement for short-term, skilled services, Acute Rehab Unit (ARU) placement, or ambulance transport upon discharge    Anticipated Discharge Site:  []  Return home independently  [x]  Return home with personal care assistance by family or employed caregiver  []  Return to Assisted Living Facility (ALF) or Board & Care (B&C)  []  Return to Nursing Home (NH) for custodial/long-term care  []  Skilled Nursing Facility (SNF) for short-term, post-acute skilled services (rehab, IV therapy, complex wound care, or new G-tube management)  []  Acute Rehab Unit (ARU)  []  To be determined, pending clinical course     Anticipated Discharge Needs:  [x]  Home Health [RN with or without Physical Therapy, Occupational Therapy, bath aide, Medical Child psychotherapist (MSW)]  [x]  Horticulturist, commercial (DME) for home discharges (if patient does not already own one):               []  cane              []  home O2      []  enteral pump & formula          []  other:               [x]  walker           []  nebulizer       []  suction machine                     []   none or to be determined               []  wheelchair    []  BiPap []  wound vac                []  commode     []  pneumovest  []  low air mattress (for pressure injuries stage 3 or worse)               []  hospital bed  []  hoyer lift        []  pleurX  []  Skilled Nursing Facility (SNF) placement for short-term, post-acute skilled services  []  Acute Rehab Unit (ARU) placement  []  New nursing home (NH) placement for custodial/long-term care  []  New Assisted Living Facility (ALF) or Board and Care (B&C) placement  []  Personal care assistance by family or employed caregiver  []  None     Findings and recommended plan of care discussed with patient/surrogate:   Extended Emergency Contact Information  Primary Emergency Contact: Beutner,Austin  Mobile Phone: [phone]  Relation: Other  Secondary Emergency Contact: Beutner,Virgina  Address: [address]           [place], [place] [address] [place]  Home Phone: [phone]  Mobile Phone: [phone]  Relation: Daughter  Preferred language: English    Patient will be discussed with geriatrics attending Dr. Shannon Darter.    Herb Loges. Alita Irwin, MD    ATTENDING ADDENDUM:    Patient seen and examined. I personally reviewed the vitals, labs, medications, radiology imaging, radiology reports, EKG and recent notes. I reviewed and agree with Dr. Joseph Nickel note which documents the findings and assessment we formulated together. Any addendum noted inline above and as follows.    This patient requires continued stay in the acute care setting tonight because of discharge planning, home O2, monitoring of hypoxic respiratory failure    Doing much better today, lung exam improved, feels subjectively better  Does not appear volume overloaded, communicated with outpatient cardiologist and PCP, confirmed patient not on diuretics in outpatient setting.  Suspect became volume overloaded during recent hospitalization and illness with diverticulitis (though patient does not think she received IV fluids, does know she received IV antibiotics).    Given stability and close follow up scheduled with cardiology will defer discharging on diuretics, confirmed with PCP that patient has close monitoring and support at home  Patient does have COPD, myasthenia gravis and chronic hypoxic respiratory failure.  Home O2 was destroyed in [employer], has not obtained replacement yet, potentially contributing to presentation  Desats to 86-87% with activity on RA here in hospital, home O2 ordered, discharged delayed due to delay in O2 delivery, will dc home tomorrow    Greater than 50% of today's 60 minute encounter was spent on evaluating the patient and formulating the assessment and plan, counseling the patient/patient's family/caregiver regarding the above diagnoses, treatment plan and coordinating care with resident physician, nursing staff, rehab staff, dietician, pharmacist, SW and Case Manager, reviewing patient's record and updating patient's PCP.   Patient / caregiver / family in agreement with plan.    Signed: Arlena Marsan D. Shannon Darter, MD

## 2023-06-14 NOTE — Telephone Encounter
 Hello,   Please assist with accommodation for hospital f/u     Is the patient being discharged to SNF? No  Clinic/Test/Procedure to be scheduled:PCP  Doctor Preferences (If applicable): Volney Grumbles., MD  Time Frame: 1 week  Reason for Appointment/Procedure: Follow up recent hospitalization for heart failure and COPD exacerbation    FYI-    Discharge Followup Appointment Info      Date of Discharge: 06/14/2023    Discharge Disposition: Eloped from ED   TIme Frame: Within 7 Days    Sched with PCP?: Patient's [place] PCP PCP Name: Volney Grumbles., MD   Was Appt Confirmed?: Neg    Barriers to Scheduling: Clinic scheduling protocol List Other Barriers, if applicable: GERI Pool   Did you schedule any Specialty visits?: No    Additional Appts/Tests/Info I will f/u with pt at [phone] once appointment has been finalized.

## 2023-06-14 NOTE — Telephone Encounter
 Placed call to patient and scheduled hosp follow up 4/22

## 2023-06-14 NOTE — Other
 Patient's Clinical Goal:   Clinical Goal(s) for the Shift: shower, hemodynamic stability-maintain MAP>65, pulmonary stability- maintain spO2>88-92%  Identify possible barriers to advancing the care plan: underlying disease process  Stability of the patient: Moderately Stable - low risk of patient condition declining or worsening   Progression of Patient's Clinical Goal:   Pt A/Ox4, follows commands, no complaints of pain, ambulates with minimal assistance; afebrile; MAP>65 without pressors; spO2 >88% on room air, occasional dyspnea on exertion with ambulation; on 2g Na diet with 1800 fluid restriction; ambulates to bathroom to void; Bmx1 overnight; skin intact; PIVx1; oxygen delivered at bedside    Plan for discharge

## 2023-06-14 NOTE — Consults
 IP CM ACTIVE DISCHARGE PLANNING  Department of Care Coordination      Admit ZOXW:[id number]  Anticipated Date of Discharge: 06/14/2023    Following UJ:[id number], Elam Gray., MD      Today's short update     (P) 1044: Met with patient at bedside. Patient has tank but no concentrator. IM signed with Erica CM; patient provided copy. Called [employer] [phone] - per Volanda Gruber, they can deliver oncentrator here to the bedside within 4 hours. Pt's family staff Mickael Alamo will be available to pick patient up whenever concentrator arrives.    Disposition     (P) Home  (P) Durable Medical Equipment  (P) [address], [place], [place]  Family/Support System in agreement with the current discharge plan: Yes, in agreement and participating    Multidisciplinary Team Member Plan of Care   Interdisciplinary rounds were conducted with the multidisciplinary team including the clinical social worker and nurse case manager. The patient's plan of care and discharge plan were discussed and formulated based on the patient's specific needs.    DME or RT Equipment Status (if applicable)     (P) ETA (please comment) (6/7) (4 hours)      [place],  06/14/2023

## 2023-06-14 NOTE — Progress Notes
 FINAL DISCHARGE MULTIDISCIPLINARY NOTE  Department of Care Coordination      Admit EAVW:[id number]  Anticipated Date of Discharge: 06/14/2023    Following MD:No att. providers found    Home Address:[address]  [place] [address]      DISCHARGE INFORMATION:     Discharge Address: (P) [address]  [place] [address]    Individual(s) notified of discharge plan:    Relationship: (P) Self          Is patient/family informed of discharge?: (P) Yes Is patient/family agreeable of discharge destination?: (P) Yes     Support Systems: (P) Private Caregiver       Medicare Important Message Provided: (P) Yes (given at 1040a)                                 Home Durable Medical and/or Respiratory Equipment (if applicable):   Vendor: [facility]   [phone]  Equipment Provided: (P) home oxygen  Equipment Provided: (P) Oxygen Concentrator, Oxygen Tank  Delivery Arrangements: (P) delivered to bedside  Comments: (P) patient has home oxygen portable delivered to bedside, and concentrator, Her concentrator from [employer] has an error message when she uses  1L NC. CM Jasmine spoke with liaison Jose from [facility] they are going to send a tech to service the concentrator or swap if needed. Patient has been made aware. Patient also does not qualify for commode per [employer] she is not confined to one room and states she needed it only as a shower chair. This cm  infomred her of other options for out of pocket DME [employer] [facility] is not able to supply it for her but she is able to obtain from [employer] or other local pharmacies such as [facility], [facility], or [facility], Patient agreed she is able to purchase out of pocket    Hemodialysis (if applicable):        Homeless Discharge (if applicable):   Primary Living Situation: Lives w/Family (lives with son and Social worker, daughter in rented house)    Mudlogger (if applicable):       Patients caregiver to transport home   SDOH   Social Drivers of Health (SDOH)  Within the past 12 months, has a lack of transportation kept you from medical appointments, meetings, work, or from getting things needed for daily living? : No  How hard is it for you to pay for the very basics like food, housing, medical care, and heating?: Not hard at all  How hard is it for you to pay for prescriptions or medical bills?: Not hard at all  In the past 12 months has the electric, gas, oil, or water company threatened to shut off services in your home?: No  In the last 12 months, was there a time when you were not able to pay the mortgage or rent on time?: No  In the last 12 months, was there a time when you did not have a steady place to sleep or slept in a shelter (including now)?: No    Marcelline Sermons,  06/14/2023

## 2023-06-18 ENCOUNTER — Ambulatory Visit: Payer: BLUE CROSS/BLUE SHIELD | Attending: Geriatric Medicine

## 2023-06-18 ENCOUNTER — Other Ambulatory Visit: Payer: BLUE CROSS/BLUE SHIELD

## 2023-06-18 DIAGNOSIS — J9611 Chronic respiratory failure with hypoxia: Secondary | ICD-10-CM

## 2023-06-18 MED ORDER — POTASSIUM CHLORIDE ER 10 MEQ PO TBCR
10 meq | ORAL_TABLET | Freq: Every day | ORAL | 3 refills | Status: AC
Start: 2023-06-18 — End: 2023-06-28

## 2023-06-18 MED ORDER — FUROSEMIDE 20 MG PO TABS
20 mg | ORAL_TABLET | Freq: Every day | ORAL | 3 refills | Status: AC
Start: 2023-06-18 — End: ?
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
## 2023-06-19 ENCOUNTER — Other Ambulatory Visit: Payer: BLUE CROSS/BLUE SHIELD

## 2023-06-19 ENCOUNTER — Telehealth: Payer: BLUE CROSS/BLUE SHIELD

## 2023-06-19 NOTE — Telephone Encounter
 Faxed DME order to     [employer]   P [phone]  F [phone]

## 2023-06-19 NOTE — Telephone Encounter
 Update   Discharge Followup Appointment Info      Date of Discharge: 06/14/2023    Discharge Disposition: Home or Self Care   TIme Frame: Within 7 Days    Sched with PCP?: Patient's [facility] PCP PCP Name: Volney Grumbles., MD   Date and Time of appt: 06/18/23  2:40 PM PDT    Was Appt Confirmed?: Neg    Barriers to Scheduling: Clinic scheduling protocol List Other Barriers, if applicable: GERI Pool   Did you schedule any Specialty visits?: No    Additional Appts/Tests/Info Appointment has been completed

## 2023-06-19 NOTE — Telephone Encounter
-----   Message from Volney Grumbles, MD sent at 06/18/2023  3:18 PM PDT -----  Please help order portable oxygen concentrator  Pulse ox 88% RA

## 2023-06-20 ENCOUNTER — Ambulatory Visit: Payer: BLUE CROSS/BLUE SHIELD | Attending: Student in an Organized Health Care Education/Training Program

## 2023-06-20 DIAGNOSIS — G7000 Myasthenia gravis without (acute) exacerbation: Secondary | ICD-10-CM
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
## 2023-06-26 ENCOUNTER — Telehealth: Payer: BLUE CROSS/BLUE SHIELD

## 2023-06-26 NOTE — Telephone Encounter
 Message to Practice/Provider      Message: Jocelyn from  [facility] medical called stated they received referral for oxygen, patient stated it was delivered from a different company   Please advise   Thank you       Return call is not being requested by the patient or caller.    Patient or caller has been notified that their message will be reviewed within 1-2 business days.

## 2023-06-27 ENCOUNTER — Ambulatory Visit: Payer: BLUE CROSS/BLUE SHIELD

## 2023-06-27 ENCOUNTER — Other Ambulatory Visit: Payer: BLUE CROSS/BLUE SHIELD

## 2023-06-27 ENCOUNTER — Ambulatory Visit: Payer: BLUE CROSS/BLUE SHIELD | Attending: Geriatric Medicine

## 2023-06-27 DIAGNOSIS — I700 Atherosclerosis of aorta: Secondary | ICD-10-CM

## 2023-06-27 DIAGNOSIS — E785 Hyperlipidemia, unspecified: Secondary | ICD-10-CM

## 2023-06-27 DIAGNOSIS — I5022 Chronic systolic (congestive) heart failure: Secondary | ICD-10-CM

## 2023-06-27 MED ORDER — SPIRONOLACTONE 25 MG PO TABS
25 mg | ORAL_TABLET | Freq: Every day | ORAL | 3 refills | 30.00 days | Status: AC
Start: 2023-06-27 — End: ?

## 2023-06-27 NOTE — Consults
 CARDIOLOGY NOTE      PATIENT: [name]   MRN: [id number]   DOB: 26-Oct-1935   DATE OF SERVICE: 06/27/2023    PCP:  Volney Grumbles., MD      Problem List Items Addressed This Visit    None  Visit Diagnoses         Systolic CHF, chronic (HCC/RAF)    -  Primary      Atherosclerosis of aorta          Dyslipidemia                    Interval History:     06/27/2023:   A/w daughter  Recent hospitalization for dyspnea.   She was started on Lasix  Uses a walker     08/2022:  The patient feels SOB/dyspnea and tired  She notes chest pain and fibromyalgia   Ambulates with a walker  ET: 1/2 block, stable     Prior Encounter:     This is a 88 y.o. female who is here for cardiac evaluation.  Indication:   SOB    The patient is a/w friend, Enzo Has    Pt was recently hospitalized with SOB and chest pain.   Pt ambulates with a walker for precautionary reasons given history of falls.   With activity, the patient reports SOB/dypnea. She walks the dog 1/2 block  Her symptoms have been ongoing for the past 3 years, progressive worsening.   The patient notes allergies and asthma.   About 1 year ago, she could walk 1.5 blocks         Past Medical History:   Diagnosis Date    Asthma     Breast cancer (HCC/RAF) 12/20/2018    CHF (congestive heart failure) (HCC/RAF)     COPD (chronic obstructive pulmonary disease) (HCC/RAF)     Diabetes mellitus (HCC/RAF)     Renal disorder        No past surgical history on file.    Allergies:   Allergies   Allergen Reactions    Beta Adrenergic Blockers Other (See Comments)     Avoid d/t Myasthenia Gravis  Avoid d/t Myasthenia Gravis      Levofloxacin Other (See Comments)     Flare of Myasthenia Gravis    Macrolides And Ketolides Other (See Comments)     Avoid d/t Myasthenia Gravis  Use with caution d/t Myasthenia Gravis  Use with caution d/t Myasthenia Gravis      Sulfa Antibiotics Other (See Comments)     May possibly have caused deafness in one ear  May possibly have caused deafness in one ear  other Botulinum Toxins Other (See Comments)     Muscle weakness r/t Myasthenia Gravis  Muscle weakness r/t Myasthenia Gravis      Pollen Extract Wheezing    Statins Other (See Comments)     Muscle aches  Muscle aches  Muscle aches      Plastic Tape Itching and Other (See Comments)     Turns the skin red where applied       Social History     Socioeconomic History    Marital status: Widowed   Tobacco Use    Smoking status: Former     Current packs/day: 0.00     Types: Cigarettes     Quit date: 1985     Years since quitting: 40.3    Smokeless tobacco: Former   Substance and Sexual Activity    Alcohol use:  Never    Drug use: Never    Sexual activity: Not Currently     Social Drivers of Health     Financial Resource Strain: Low Risk  (06/12/2023)    Financial Resource Strain     Difficulty of Paying Living Expenses: Not hard at all       No family history on file.  Adopted     Objective:     BP 113/71  ~ Pulse (!) 100  ~ Wt 140 lb (63.5 kg)  ~ SpO2 93% Comment: per daughter doesnt have portable O2 tank yet, been without supp oxygen for a couple hrs ~ BMI 23.30 kg/m?     Vitals:    06/27/23 1305   BP: 113/71   Pulse: (!) 100   SpO2: 93%   Weight: 140 lb (63.5 kg)       BP Readings from Last 3 Encounters:   06/27/23 113/71   06/20/23 134/74   06/18/23 115/76       Body mass index is 23.3 kg/m?Aaron Aas    Wt Readings from Last 3 Encounters:   06/27/23 140 lb (63.5 kg)   06/20/23 141 lb (64 kg)   06/18/23 141 lb (64 kg)          General Exam: no apparent distress.    Neurologic/Psychiatric: alert and oriented. Affect is appropriate.  HEENT: Sclerae are anicteric. There is no epistaxis.  Cardiac: regular in rate and rhythm.    Respiratory: non-labored breathing. No use of accessory muscles. No wheeze.  Abdomen: bowel sounds are present. Abdomen is soft. No rebound.    Extremities: no edema. No cyanosis or clubbing.    Integument: no petechiae. No acute rash.         Laboratory:  Lab Results   Component Value Date    NA 140 06/18/2023 K 4.8 06/18/2023    CL 103 06/18/2023    CO2 23 06/18/2023    BUN 28 (H) 06/18/2023    CREAT 1.46 (H) 06/18/2023    GLUCOSE 81 06/18/2023    CALCIUM 9.5 06/18/2023     Lab Results   Component Value Date    CKTOT 51 12/11/2022    CKTOT 75 07/05/2022    CKTOT 54 01/07/2022    TROPONIN 13 (H) 06/11/2023    TROPONIN 32 (H) 06/11/2023    TROPONIN 0.05 (H) 12/19/2020    TROPONIN 0.07 (H) 12/19/2020    TROPONIN 0.06 (H) 12/18/2020     Lab Results   Component Value Date    BNP 1,157 (H) 06/18/2023     Lab Results   Component Value Date    WBC 11.45 (H) 06/14/2023    HGB 13.7 06/14/2023    HCT 43.8 06/14/2023    MCV 98.4 06/14/2023    PLT 344 06/14/2023     Lab Results   Component Value Date    CHOL 160 12/11/2022    CHOLHDL 55 12/11/2022    CHOLDLQ 76 12/11/2022    TRIGLY 181 (H) 12/11/2022     Lab Results   Component Value Date    PT 11.7 06/12/2023    INR 0.9 06/12/2023     No components found for: ''HA1C''  Lab Results   Component Value Date    TSH 2.5 04/17/2023       Patient Active Problem List   Diagnosis    Acute medial meniscal tear, left, subsequent encounter    Angioedema    Anxiety    Asthmatic bronchitis, mild persistent,  with acute exacerbation    Chronic cholecystitis    Chronic interstitial cystitis    CKD (chronic kidney disease), stage III (HCC/RAF)    COPD mixed type (HCC/RAF)    Major depression, recurrent, chronic (HCC/RAF)    Diabetes mellitus (HCC/RAF)    Esophageal reflux    Eustachian tube dysfunction    Fibromyalgia    H/O arthroscopy    Insomnia    Myasthenia gravis with exacerbation (HCC/RAF)    Nocturnal hypoxemia    Osteoarthritis of knee    Renal cyst    Seasonal and perennial allergic rhinitis    Spondylosis without myelopathy or radiculopathy, lumbar region    Urethral caruncle    Vaginal atrophy    Benzodiazepine dependence (HCC/RAF)    Breast cancer (HCC/RAF)    Refractive error    Ptosis of eyelid    Pseudophakia of both eyes    Aortic calcification    HTN (hypertension) Immunosuppressed status    Acute chest pain    Atypical chest pain    Heart failure with reduced ejection fraction LVEF <=40% (HCC/RAF)    Epiretinal membrane, bilateral    PVD (posterior vitreous detachment), bilateral    Chorioretinal scars, bilateral       Outpatient Medications Prior to Visit   Medication Sig    Albuterol Sulfate AEPB Takes 1 to 2 puffs every 4 hours as needed for wheezing or SOB Inhale Takes 1 to 2 puffs every 4 hours as needed for wheezing or SOB .    aspirin 81 mg EC tablet Take 1 tablet (81 mg total) by mouth daily.    azaTHIOprine 50 mg tablet Take one and one-half tablet (75 mg total) by mouth two (2) times daily.    clonazePAM 1 mg tablet TAKE 1.5 TABLET BY MOUTH AT NIGHT    dapagliflozin (FARXIGA) 10 mg tablet Take 1 tablet (10 mg total) by mouth daily    DEXILANT 60 MG DR capsule Take 1 capsule (60 mg total) by mouth daily.    DULOXETINE 20 mg DR capsule TAKE 3 CAPSULES (60 MG TOTAL) BY MOUTH DAILY    EPINEPHrine 0.3 mg/0.3 mL auto-injector Inject 0.3 mLs (0.3 mg total) into the muscle as needed for for Anaphylaxis.    Evolocumab (REPATHA SURECLICK) 140 MG/ML SOAJ Inject 1 pen. under the skin every fourteen (14) days.    exemestane 25 mg tablet Take 1 tablet (25 mg total) by mouth daily.    fluticasone-vilanterol (BREO ELLIPTA) 100-25 mcg/inh inhaler Inhale 1 puff daily.    furosemide 20 mg tablet Take 1 tablet (20 mg total) by mouth daily.    losartan 100 mg tablet Take 1 tablet (100 mg total) by mouth daily.    montelukast 10 mg tablet Take 1 tablet (10 mg total) by mouth every evening.    penicillin V potassium 500 mg tablet Take 1 tablet (500 mg total) by mouth two (2) times daily.    pyRIDostigmine 60 mg tablet Take 1 tablet (60 mg total) by mouth three (3) times daily.    tiotropium 18 mcg/act inhalation capsule Place 1 capsule (18 mcg total) into inhaler and inhale daily.    WELLBUTRIN SR 200 MG 12 hr tablet TAKE 1 TABLET (200 MG TOTAL) BY MOUTH TWO (2) TIMES DAILY    potassium chloride 10 mEq CR tablet Take 1 tablet (10 mEq total) by mouth daily.     No facility-administered medications prior to visit.       Current Outpatient Medications  Medication Sig    Albuterol Sulfate AEPB Takes 1 to 2 puffs every 4 hours as needed for wheezing or SOB Inhale Takes 1 to 2 puffs every 4 hours as needed for wheezing or SOB .    aspirin 81 mg EC tablet Take 1 tablet (81 mg total) by mouth daily.    azaTHIOprine 50 mg tablet Take one and one-half tablet (75 mg total) by mouth two (2) times daily.    clonazePAM 1 mg tablet TAKE 1.5 TABLET BY MOUTH AT NIGHT    dapagliflozin (FARXIGA) 10 mg tablet Take 1 tablet (10 mg total) by mouth daily    DEXILANT 60 MG DR capsule Take 1 capsule (60 mg total) by mouth daily.    DULOXETINE 20 mg DR capsule TAKE 3 CAPSULES (60 MG TOTAL) BY MOUTH DAILY    EPINEPHrine 0.3 mg/0.3 mL auto-injector Inject 0.3 mLs (0.3 mg total) into the muscle as needed for for Anaphylaxis.    Evolocumab (REPATHA SURECLICK) 140 MG/ML SOAJ Inject 1 pen. under the skin every fourteen (14) days.    exemestane 25 mg tablet Take 1 tablet (25 mg total) by mouth daily.    fluticasone-vilanterol (BREO ELLIPTA) 100-25 mcg/inh inhaler Inhale 1 puff daily.    furosemide 20 mg tablet Take 1 tablet (20 mg total) by mouth daily.    losartan 100 mg tablet Take 1 tablet (100 mg total) by mouth daily.    montelukast 10 mg tablet Take 1 tablet (10 mg total) by mouth every evening.    penicillin V potassium 500 mg tablet Take 1 tablet (500 mg total) by mouth two (2) times daily.    pyRIDostigmine 60 mg tablet Take 1 tablet (60 mg total) by mouth three (3) times daily.    tiotropium 18 mcg/act inhalation capsule Place 1 capsule (18 mcg total) into inhaler and inhale daily.    WELLBUTRIN SR 200 MG 12 hr tablet TAKE 1 TABLET (200 MG TOTAL) BY MOUTH TWO (2) TIMES DAILY    [DISCONTINUED] potassium chloride 10 mEq CR tablet Take 1 tablet (10 mEq total) by mouth daily.    spironolactone 25 mg tablet Take 1 tablet (25 mg total) by mouth daily.     No current facility-administered medications for this visit.             ACC/AHA Cholesterol Management Guideline Recommendations:   To continue statin treatment, as indicated based on pre-treatment risk assessment.      ASCVD Risk Score    10-year ASCVD risk  N/A. Age > 75. as of 1:31 PM on 06/27/2023.   10-year ASCVD risk with optimal risk factors  cannot be calculated.   Values used to calculate ASCVD Risk Score    Age  56 y.o. Cannot calculate risk because age is not between 52 and 26 years old.   Gender  female   Race  White   HDL Cholesterol  55 mg/dL. (measured on 12/11/2022)   LDL Cholesterol  76 mg/dL. (measured on 12/11/2022)   Total Cholesterol  160 mg/dL. (measured on 12/11/2022)   Systolic Blood Pressure  113 mm Hg. (measured on 06/27/2023)   Blood Pressure Medication Present  Yes   Smoking Status  currently not a smoker   Diabetes Present  Yes     Click here for the [facility] ASCVD Cardiovascular Risk Estimator Plus tool Office manager).           Assessment:     Gerene Loudermilk is 88 y.o. female with  Cardiomyopathy, EF 35-40% Echo in 2023, down to 22% Echo 05/2023  (G2211 - responsible for the longitudinal management of complex or chronic conditions that require ongoing care and coordination).      - avoiding BBlockers due to myasthenia gravis    Abnormal Myoview in 2023: There is a moderate-severe perfusion defect in the distal anteroseptal wall and apex, suggestive for MI.    SOB/dyspnea. Uses oxygen concentrator  Abnormal PFTs, h/o COPD   Abnormal EKG with LBBB and PACs  Former tobacco use  Subtle calcifications of the aortic root. Moderately calcified thoracic aorta and its main branch vessels.  Intolerant to Statins  Frail and deconditioned     Plan:     Losartan 100 mg  ASA    Repatha - discussed compliance   Farxiga  DC KCl  Start Aldactone    Discussed with patient and daughter re: cardiac cath if within goals of care. She will meet with PCP today and will discuss goals of care.    Volney Grumbles., MD  Lenice Koper, MD  Hi Lonnie Reth.   She and I had a thoughtful discussion today.  In light of her breast cancer and her Myasthenia Gravis, it seems like medical management of her CAD is best.         Orders Placed This Encounter    Lipid Panel    Chol,LDL,Quant    ALT (SGPT)    Potassium    eGFR by Creatinine    CK, Total    spironolactone 25 mg tablet           Return in about 4 weeks (around 07/25/2023).      GDMT for Heart Failure with Reduced Ejection Fraction   Most recent LVEF: 22%, HFrEF present on problem list: Yes.   For the most accurate recommendations, please ensure that a LVEF is present and HFrEF is added to the problem list, if applicable      GDMT Medication Class Current Prescription Recommendation   Specified Beta Blockers^ No HFrEF beta blocker found Patient has a listed allergy to beta blockers   ACEi, ARB, ARNi^^ Losartan Patient is on guideline recommended ACEI or ARB. May consider transition to sacubitril-valsartan if no contraindications exist and most recent LVEF<=40%.   MRA Spironolactone Patient is on guideline recommended MRA   SGLT2i Dapagliflozin Patient is on guideline recommended SGLT2i   ^ Specified beta blockers: metoprolol succinate, carvedilol, bisoprolol   Aaron Aas ARNI (sacubitril/valsartan) preferred. Criteria for ARNI initiation: Discontinuation of ACEI for at least 36 hours, eGFR>=30, K<=5.2, no history of angioedema   Click here for ACC / AHA HFrEF GDMT recommendations         Patient Instructions     Having Cardiac Catheterization  You may have had chest pain (angina), dizziness, or other symptoms of heart trouble. To help diagnose your problem, your healthcare provider may advise a cardiac catheterization. This is a procedure that looks for a blockage or narrow area in the arteries around the heart. These can cause chest pain or a heart attack if not treated. It can also be used to evaluate other problems with your heart.  This common procedure may also be used to treat a heart problem. It may be done as a planned procedure if you've had chest pain in the past. Or it may be done right away to treat a suspected heart attack.     The catheter may be placed in the arm or the groin.  Before the procedure  Tell your healthcare team what medicines you take and about any allergies you have.  Follow any directions you're given for not eating or drinking before the procedure.     During the procedure  Hair may be trimmed where the catheter will be inserted. This may be in your leg (groin), wrist, or arm.  You may be given medicine to relax before the procedure.  You'll be given a local anesthetic to prevent pain at the insertion site.  A healthcare provider inserts a tube (sheath) into a blood vessel in your groin or arm.  Through the sheath, a long, thin tube called a catheter is placed inside the artery. The catheter is then guided toward your heart under X-ray guidance.  The catheter can then be used to measure pressures in the heart. It can take blood samples if needed. It can also be used to inject contrast into the heart arteries to look for blockages. This is called angiography.     After the procedure  Your healthcare providers will tell you how long to lie down and keep the insertion site still.  If the insertion site was in your groin, you may need to lie down with your leg still for up to 6 or more hours. A stitch (suture) or closure device such as a collagen plug may be used on the artery site to close the site. If so, you may be able to move sooner. This depends on any bleeding that occurs.  If your arm was used, you may need to wear a special type of immobilizing device and pressure bandage for a few hours after the procedure.  A nurse will check the insertion site and your blood pressure.  You may be asked to drink fluid. This is to help flush the contrast liquid out of your system.  Have someone drive you home from the hospital.  It?s normal to find a small bruise or lump at the insertion site. This should go away in a few weeks.     When to call your healthcare provider  Call your healthcare provider right away if you have any of these:  Pain, swelling, redness, warmth, bleeding, or fluid leaking at the insertion site  New, severe back pain or chest pain  Inability to pee  Blood in your urine, black or sticky stools, or any other kind of bleeding  Fever of 100.4?F ( 38.0?C) or higher, or as advised by your provider  Call 911  Call 911 if you have any of these:     Chest pain or pressure, nausea or vomiting, profuse sweating, dizziness, or fainting  Shortness of breath or trouble breathing  Severe pain, coldness, or a bluish color in the leg or arm where the catheter was inserted  Sudden numbness or weakness in arms, legs, or face, or difficulty speaking  The puncture site swells up very fast  Bleeding from the puncture site doesn't slow down when you press on it firmly     StayWell last reviewed this educational content on 03/29/2020  ? 2000-2022 [employer], [place]. All rights reserved. This information is not intended as a substitute for professional medical care. Always follow your healthcare professional's instructions.          Future Appointments   Date Time Provider Department Center   06/27/2023  3:00 PM Volney Grumbles., MD GERI IMS [address] [place]/Cen   07/08/2023 11:40 AM Volney Grumbles., MD GERI IMS [address] [place]/Cen  07/23/2023  1:30 PM 2020 2ND FLOOR, LAB PATH [id number]   07/24/2023 11:40 AM Fairy Homer., MD HEM/ONC [address]   07/25/2023  1:15 PM June Old, MD CRD DIS 2020 [facility]   09/19/2023 10:30 AM Jaqueline Mering, MD [facility] [address] [place]/Cen         Author: Serah Nicoletti, MD

## 2023-06-27 NOTE — Patient Instructions
 Having Cardiac Catheterization  You may have had chest pain (angina), dizziness, or other symptoms of heart trouble. To help diagnose your problem, your healthcare provider may advise a cardiac catheterization. This is a procedure that looks for a blockage or narrow area in the arteries around the heart. These can cause chest pain or a heart attack if not treated. It can also be used to evaluate other problems with your heart.  This common procedure may also be used to treat a heart problem. It may be done as a planned procedure if you've had chest pain in the past. Or it may be done right away to treat a suspected heart attack.     The catheter may be placed in the arm or the groin.      Before the procedure  Tell your healthcare team what medicines you take and about any allergies you have.  Follow any directions you're given for not eating or drinking before the procedure.     During the procedure  Hair may be trimmed where the catheter will be inserted. This may be in your leg (groin), wrist, or arm.  You may be given medicine to relax before the procedure.  You'll be given a local anesthetic to prevent pain at the insertion site.  A healthcare provider inserts a tube (sheath) into a blood vessel in your groin or arm.  Through the sheath, a long, thin tube called a catheter is placed inside the artery. The catheter is then guided toward your heart under X-ray guidance.  The catheter can then be used to measure pressures in the heart. It can take blood samples if needed. It can also be used to inject contrast into the heart arteries to look for blockages. This is called angiography.     After the procedure  Your healthcare providers will tell you how long to lie down and keep the insertion site still.  If the insertion site was in your groin, you may need to lie down with your leg still for up to 6 or more hours. A stitch (suture) or closure device such as a collagen plug may be used on the artery site to close the site. If so, you may be able to move sooner. This depends on any bleeding that occurs.  If your arm was used, you may need to wear a special type of immobilizing device and pressure bandage for a few hours after the procedure.  A nurse will check the insertion site and your blood pressure.  You may be asked to drink fluid. This is to help flush the contrast liquid out of your system.  Have someone drive you home from the hospital.  It?s normal to find a small bruise or lump at the insertion site. This should go away in a few weeks.     When to call your healthcare provider  Call your healthcare provider right away if you have any of these:  Pain, swelling, redness, warmth, bleeding, or fluid leaking at the insertion site  New, severe back pain or chest pain  Inability to pee  Blood in your urine, black or sticky stools, or any other kind of bleeding  Fever of 100.4?F ( 38.0?C) or higher, or as advised by your provider  Call 911  Call 911 if you have any of these:     Chest pain or pressure, nausea or vomiting, profuse sweating, dizziness, or fainting  Shortness of breath or trouble breathing  Severe pain, coldness, or  a bluish color in the leg or arm where the catheter was inserted  Sudden numbness or weakness in arms, legs, or face, or difficulty speaking  The puncture site swells up very fast  Bleeding from the puncture site doesn't slow down when you press on it firmly     StayWell last reviewed this educational content on 03/29/2020  ? 2000-2022 [employer], [place]. All rights reserved. This information is not intended as a substitute for professional medical care. Always follow your healthcare professional's instructions.
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
## 2023-07-01 NOTE — Telephone Encounter
 Advised [employer] to disregard order if patient received oxygen from a different medical supplier

## 2023-07-02 ENCOUNTER — Ambulatory Visit: Payer: BLUE CROSS/BLUE SHIELD

## 2023-07-03 ENCOUNTER — Ambulatory Visit: Payer: BLUE CROSS/BLUE SHIELD | Attending: Interventional Cardiology

## 2023-07-03 ENCOUNTER — Ambulatory Visit: Payer: BLUE CROSS/BLUE SHIELD

## 2023-07-03 ENCOUNTER — Ambulatory Visit: Payer: BLUE CROSS/BLUE SHIELD | Attending: Geriatric Medicine

## 2023-07-03 DIAGNOSIS — R9439 Abnormal result of other cardiovascular function study: Secondary | ICD-10-CM

## 2023-07-03 DIAGNOSIS — I5022 Chronic systolic (congestive) heart failure: Secondary | ICD-10-CM

## 2023-07-03 DIAGNOSIS — E785 Hyperlipidemia, unspecified: Secondary | ICD-10-CM

## 2023-07-03 DIAGNOSIS — I700 Atherosclerosis of aorta: Secondary | ICD-10-CM

## 2023-07-03 NOTE — Progress Notes
 CARDIOLOGY OUTPATIENT HISTORY AND PHYSICAL EXAMINATION    REQUESTING PHYSICIAN.  Diondra Pines, Kaylene Pascal., MD    REASON FOR CONSULTATION.  Discuss Cath    Patient Consent to Telehealth   The patient agreed to participate in the video visit prior to joining the visit.      HISTORY OF PRESENT ILLNESS.  This is a 88 y.o. female with a medical history notable for LBBB, CKD, COPD, Depression, Myasthenia Gravis, Hypertension, chronic HFrEF of unknown etiology who was initially referred to discuss a coronary artery angiogram. She is a patient of my colleague, Dr. Tabibiazar. She has had a significant drop in LVEF. She has a prior MPI which showed large territory of infarct. Plan is to move forward with a coronary artery angiogram.     PAST MEDICAL/SURGICAL HISTORY.  Patient Active Problem List   Diagnosis    Acute medial meniscal tear, left, subsequent encounter    Angioedema    Anxiety    Asthmatic bronchitis, mild persistent, with acute exacerbation    Chronic cholecystitis    Chronic interstitial cystitis    CKD (chronic kidney disease), stage III (HCC/RAF)    COPD mixed type (HCC/RAF)    Major depression, recurrent, chronic (HCC/RAF)    Diabetes mellitus (HCC/RAF)    Esophageal reflux    Eustachian tube dysfunction    Fibromyalgia    H/O arthroscopy    Insomnia    Myasthenia gravis with exacerbation (HCC/RAF)    Nocturnal hypoxemia    Osteoarthritis of knee    Renal cyst    Seasonal and perennial allergic rhinitis    Spondylosis without myelopathy or radiculopathy, lumbar region    Urethral caruncle    Vaginal atrophy    Benzodiazepine dependence (HCC/RAF)    Breast cancer (HCC/RAF)    Refractive error    Ptosis of eyelid    Pseudophakia of both eyes    Aortic calcification    HTN (hypertension)    Immunosuppressed status    Acute chest pain    Atypical chest pain    Heart failure with reduced ejection fraction LVEF <=40% (HCC/RAF)    Epiretinal membrane, bilateral    PVD (posterior vitreous detachment), bilateral Chorioretinal scars, bilateral       MEDICATIONS.    Current Outpatient Medications   Medication Sig    Albuterol Sulfate AEPB Takes 1 to 2 puffs every 4 hours as needed for wheezing or SOB Inhale Takes 1 to 2 puffs every 4 hours as needed for wheezing or SOB .    aspirin 81 mg EC tablet Take 1 tablet (81 mg total) by mouth daily.    azaTHIOprine 50 mg tablet Take one and one-half tablet (75 mg total) by mouth two (2) times daily.    clonazePAM 1 mg tablet TAKE 1.5 TABLET BY MOUTH AT NIGHT    dapagliflozin (FARXIGA) 10 mg tablet Take 1 tablet (10 mg total) by mouth daily    DEXILANT 60 MG DR capsule Take 1 capsule (60 mg total) by mouth daily.    DULOXETINE 20 mg DR capsule TAKE 3 CAPSULES (60 MG TOTAL) BY MOUTH DAILY    EPINEPHrine 0.3 mg/0.3 mL auto-injector Inject 0.3 mLs (0.3 mg total) into the muscle as needed for for Anaphylaxis.    Evolocumab (REPATHA SURECLICK) 140 MG/ML SOAJ Inject 1 pen. under the skin every fourteen (14) days.    exemestane 25 mg tablet Take 1 tablet (25 mg total) by mouth daily.    fluticasone-vilanterol (BREO ELLIPTA) 100-25 mcg/inh inhaler Inhale  1 puff daily.    furosemide 20 mg tablet Take 1 tablet (20 mg total) by mouth daily.    losartan 100 mg tablet Take 1 tablet (100 mg total) by mouth daily.    montelukast 10 mg tablet Take 1 tablet (10 mg total) by mouth every evening.    penicillin V potassium 500 mg tablet Take 1 tablet (500 mg total) by mouth two (2) times daily. (Patient not taking: Reported on 06/27/2023)    pyRIDostigmine 60 mg tablet Take 1 tablet (60 mg total) by mouth three (3) times daily.    spironolactone 25 mg tablet Take 1 tablet (25 mg total) by mouth daily.    tiotropium 18 mcg/act inhalation capsule Place 1 capsule (18 mcg total) into inhaler and inhale daily.    WELLBUTRIN SR 200 MG 12 hr tablet TAKE 1 TABLET (200 MG TOTAL) BY MOUTH TWO (2) TIMES DAILY    [DISCONTINUED] potassium chloride 10 mEq CR tablet Take 1 tablet (10 mEq total) by mouth daily.     No current facility-administered medications for this visit.       REVIEW OF SYSTEMS.  A complete review of systems was completed by the patient or patient?s representative and reviewed by me.    ALLERGIES.    Allergies   Allergen Reactions    Beta Adrenergic Blockers Other (See Comments)     Avoid d/t Myasthenia Gravis  Avoid d/t Myasthenia Gravis      Levofloxacin Other (See Comments)     Flare of Myasthenia Gravis    Macrolides And Ketolides Other (See Comments)     Avoid d/t Myasthenia Gravis  Use with caution d/t Myasthenia Gravis  Use with caution d/t Myasthenia Gravis      Sulfa Antibiotics Other (See Comments)     May possibly have caused deafness in one ear  May possibly have caused deafness in one ear  other      Botulinum Toxins Other (See Comments)     Muscle weakness r/t Myasthenia Gravis  Muscle weakness r/t Myasthenia Gravis      Pollen Extract Wheezing    Statins Other (See Comments)     Muscle aches  Muscle aches  Muscle aches      Plastic Tape Itching and Other (See Comments)     Turns the skin red where applied       PHYSICAL EXAMINATION.  Vital Signs.  There were no vitals taken for this visit.  Video visit     LABORATORY STUDIES.    Complete Blood Count Metabolic Panel Lipid Analysis   Lab Results   Component Value Date    WBC 11.45 (H) 06/14/2023    HGB 13.7 06/14/2023    HCT 43.8 06/14/2023    PLT 344 06/14/2023    Lab Results   Component Value Date    NA 140 06/18/2023    K 4.8 06/18/2023    BUN 28 (H) 06/18/2023    CREAT 1.46 (H) 06/18/2023    GLUCOSE 81 06/18/2023    AST 23 06/12/2023    ALT 14 06/12/2023    ALKPHOS 95 06/12/2023    BILITOT 0.2 06/12/2023    Lab Results   Component Value Date    CHOL 160 12/11/2022    CHOLDLQ 76 12/11/2022    CHOLHDL 55 12/11/2022    NOHDLCHOCAL 105 12/11/2022    TRIGLY 181 (H) 12/11/2022        IMAGING STUDIES.  All studies were personally reviewed by me  TTE  06/2023:   1. Mildly increased left ventricular size.  2. Global hypokinesis of the left ventricle.  3. LV systolic function is severely reduced. The calculated ejection fraction (Simpson's) is 22 %.  4. Diastolic function is indeterminant.  5. Mild to moderate mitral valve regurgitation.  6. Compared to prior study on 06/08/2022, LV EF has decreased and LV size has increased.    Stress MPI 2023:   1) There is a moderate-severe, fixed perfusion defect in the distal anteroseptal wall and apex suggestive for a prior MI.   No scintigraphic evidence for stress-induced ischemia.    2) Normal left ventricular size and reduced global LV systolic function, EF 36%. There are regional wall motion abnormalities present as indicated above.            IMPRESSION.  This is a 88 y.o. female with a medical history notable for LBBB, CKD, COPD, Depression, Myasthenia Gravis, Hypertension, chronic HFrEF of unknown etiology who was initially referred to discuss a coronary artery angiogram.     We discussed the risks, benefits, and alternatives to coronary artery angiogram in detail today. Specifically we discussed contrast induced nephropathy risk in her case. Risk of renal injury requiring HD 0.12%. We discussed that in some cases we stage procedures to space out contrast load. All questions were answered. Plan to proceed.       FOLLOW UP.    With Dr. Tabibiazar after cath         Geanie Keen, MD, [facility] - Interventional Cardiology  [facility]  [address] [place]  [place], [place] [address]  Office Phone: [phone]  Office Fax: [phone]

## 2023-07-04 ENCOUNTER — Ambulatory Visit: Payer: BLUE CROSS/BLUE SHIELD

## 2023-07-04 ENCOUNTER — Telehealth: Payer: BLUE CROSS/BLUE SHIELD

## 2023-07-04 ENCOUNTER — Other Ambulatory Visit: Payer: BLUE CROSS/BLUE SHIELD

## 2023-07-04 DIAGNOSIS — R9439 Abnormal result of other cardiovascular function study: Secondary | ICD-10-CM

## 2023-07-04 DIAGNOSIS — I5022 Chronic systolic (congestive) heart failure: Secondary | ICD-10-CM

## 2023-07-08 ENCOUNTER — Ambulatory Visit: Payer: BLUE CROSS/BLUE SHIELD | Attending: Geriatric Medicine

## 2023-07-08 ENCOUNTER — Other Ambulatory Visit: Payer: BLUE CROSS/BLUE SHIELD

## 2023-07-08 DIAGNOSIS — Z111 Encounter for screening for respiratory tuberculosis: Secondary | ICD-10-CM

## 2023-07-08 NOTE — Progress Notes
 OUTPATIENT GERIATRICS CLINIC VIDEO VISIT    Patient Consent to Telehealth Questionnaire   - I agree  to be treated via a video visit and acknowledge that I may be liable for any relevant copays or coinsurance depending on my insurance plan.  - I understand that this video visit is offered for my convenience and I am able to cancel and reschedule for an in-person appointment if I desire.  - I also acknowledge that sensitive medical information may be discussed during this video visit appointment and that it is my responsibility to locate myself in a location that ensures privacy to my own level of comfort.  - I also acknowledge that I should not be participating in a video visit in a way that could cause danger to myself or to those around me (such as driving or walking).  If my provider is concerned about my safety, I understand that they have the right to terminate the visit.       Patient: [name]  MRN: [id number]  DOB: 10/12/1935  PCP: Volney Grumbles., MD  DATE OF SERVICE: 07/08/2023      []  Visit conducted via [facility] ZO[facility] for the following reason(s):  []  patient/surrogate unable to access myChart video platform  []  accommodate family members that are unable to be present with the patient         Chief complaint: No chief complaint on file.          HPI:     DEZERAY PUCCIO is a 88 y.o. female who presents today for establish care/follow-up. They are at their home/ALF and accompanied by her daughter.    Feels winded when off oxygen.      Friday 136.2  Sat 137.6  Sunday  Mon 136.8    ALLERGY:     Allergies   Allergen Reactions    Beta Adrenergic Blockers Other (See Comments)     Avoid d/t Myasthenia Gravis  Avoid d/t Myasthenia Gravis      Levofloxacin Other (See Comments)     Flare of Myasthenia Gravis    Macrolides And Ketolides Other (See Comments)     Avoid d/t Myasthenia Gravis  Use with caution d/t Myasthenia Gravis  Use with caution d/t Myasthenia Gravis      Sulfa Antibiotics Other (See Comments)     May possibly have caused deafness in one ear  May possibly have caused deafness in one ear  other      Botulinum Toxins Other (See Comments)     Muscle weakness r/t Myasthenia Gravis  Muscle weakness r/t Myasthenia Gravis      Pollen Extract Wheezing    Statins Other (See Comments)     Muscle aches  Muscle aches  Muscle aches      Plastic Tape Itching and Other (See Comments)     Turns the skin red where applied         MEDICATIONS:     Current Outpatient Medications   Medication Sig    Albuterol Sulfate AEPB Takes 1 to 2 puffs every 4 hours as needed for wheezing or SOB Inhale Takes 1 to 2 puffs every 4 hours as needed for wheezing or SOB .    aspirin 81 mg EC tablet Take 1 tablet (81 mg total) by mouth daily.    azaTHIOprine 50 mg tablet Take one and one-half tablet (75 mg total) by mouth two (2) times daily.    clonazePAM 1 mg tablet TAKE 1.5 TABLET BY  MOUTH AT NIGHT    dapagliflozin (FARXIGA) 10 mg tablet Take 1 tablet (10 mg total) by mouth daily    DEXILANT 60 MG DR capsule Take 1 capsule (60 mg total) by mouth daily.    DULOXETINE 20 mg DR capsule TAKE 3 CAPSULES (60 MG TOTAL) BY MOUTH DAILY    EPINEPHrine 0.3 mg/0.3 mL auto-injector Inject 0.3 mLs (0.3 mg total) into the muscle as needed for for Anaphylaxis.    Evolocumab (REPATHA SURECLICK) 140 MG/ML SOAJ Inject 1 pen. under the skin every fourteen (14) days.    exemestane 25 mg tablet Take 1 tablet (25 mg total) by mouth daily.    fluticasone-vilanterol (BREO ELLIPTA) 100-25 mcg/inh inhaler Inhale 1 puff daily.    furosemide 20 mg tablet Take 1 tablet (20 mg total) by mouth daily.    losartan 100 mg tablet Take 1 tablet (100 mg total) by mouth daily.    montelukast 10 mg tablet Take 1 tablet (10 mg total) by mouth every evening.    penicillin V potassium 500 mg tablet Take 1 tablet (500 mg total) by mouth two (2) times daily. (Patient not taking: Reported on 06/27/2023)    pyRIDostigmine 60 mg tablet Take 1 tablet (60 mg total) by mouth three (3) times daily.    spironolactone 25 mg tablet Take 1 tablet (25 mg total) by mouth daily.    tiotropium 18 mcg/act inhalation capsule Place 1 capsule (18 mcg total) into inhaler and inhale daily.    WELLBUTRIN SR 200 MG 12 hr tablet TAKE 1 TABLET (200 MG TOTAL) BY MOUTH TWO (2) TIMES DAILY     No current facility-administered medications for this visit.         SOCIAL HISTORY:     Social History     Social History Narrative    Not on file         PHYSICAL EXAM:     There were no vitals taken for this visit.    Gen [x]  NAD     Eyes []  Conj/Lids wnl  []   Sclerae non-icteric   []  EOMI     ENT []  Oropharynx clear  []  Hearing appears adequate  []  Hearing appears impaired     Neck []  Inspect     Resp []  Unlabored []  No accessory muscle use       CV []  Edema  +HJR visible on camera!   Lymph      GI []  No obvious tenderness when palpated by self/caregiver  []  Appears distended     Neuro []  No facial droop   []   No pronator drift   []  Finger to nose intact     MS []  Gait  []  ROM  []  Balance []  No tremors     Skin []  Inspection     Psych []  Insight/judgment  []  Affect  []  Cognition             LABS and STUDIES:       Labs:  Lab Results   Component Value Date    WBC 11.45 (H) 06/14/2023    WBC 13.28 (H) 06/13/2023    WBC 7.49 06/12/2023    HGB 13.7 06/14/2023    HGB 13.2 06/13/2023    HGB 13.8 06/12/2023    MCV 98.4 06/14/2023    PLT 344 06/14/2023    PLT 357 06/13/2023    PLT 357 06/12/2023     Lab Results   Component Value Date    NA 140  06/18/2023    NA 140 06/14/2023    NA 139 06/13/2023    K 4.8 06/18/2023    K 4.2 06/14/2023    K 4.0 06/13/2023    CREAT 1.46 (H) 06/18/2023    CREAT 1.44 (H) 06/14/2023    CREAT 1.47 (H) 06/13/2023    GFRESTNOAA 29 06/09/2020    GFRESTNOAA 28 05/18/2020    GFRESTNOAA 30 03/23/2020    CALCIUM 9.5 06/18/2023    CALCIUM 9.3 06/14/2023     Lab Results   Component Value Date    ALT 14 06/12/2023    ALT 9 06/06/2023    ALT 14 04/17/2023    AST 23 06/12/2023    ALKPHOS 95 06/12/2023    BILITOT 0.2 06/12/2023    ALBUMIN 4.0 06/12/2023     Lab Results   Component Value Date    TSH 2.5 04/17/2023    TSH 1.5 12/11/2022    TSH 1.6 07/05/2022      Lab Results   Component Value Date    HGBA1C 6.0 (H) 04/17/2023    HGBA1C 5.9 (H) 07/05/2022     Lab Results   Component Value Date    CHOL 160 12/11/2022    CHOL 205 07/05/2022     Lab Results   Component Value Date    CHOLDLQ 76 12/11/2022    CHOLDLQ 135 (H) 07/05/2022     Lab Results   Component Value Date    VITD25OH 76 10/18/2021     Lab Results   Component Value Date    BNP 1,157 (H) 06/18/2023    BNP 2,160 (H) 06/11/2023         Studies:      ASSESSMENT and PLAN:       #Chronic hypoxemic respiratory failure--oxygen dependent.  Trial increased lasix to 40 to try to target dry weight of 135.  #Major Depression--active--self d/ced duloxetine.  Rechallenge.  Tolerating Duloxetine. Increase clonazepam given severe anxiety now.  #Myesthenia Gravis--worse. Got first dose of MenB today.  Will message Dr. Karasozen to see if she needs the second dose before starting ULTOMIRIS .  #HFrEF--EF 20%noted on echo-- 2023--Worse.  Chronic.  Will monitor and treat underlying risk factors with medical and lifestyle interventions as warranted by balance between benefits and burdens.   Starting furosemide 20.  Off KCl.  Start aldactone.  Encourage cath. Trial increased lasix to 40 to try to target dry weight of 135.  #CAD--discussed medical management alone with shared decision making model May 11, 2021.  Declines staint  #Cervical myelopathy--monitored by neuro--chronic--re-imaged on MRI as above.  CTM.  #HTN--Increased nifedipine CR 60 mg daily. Continue losartan 50 mg.  BP Better.    #CKD--stage 4--chronic--CTM--control BP as tolerated by symptoms of orthostasis and fatigue.  Trial off trimethoprim.  REcheck Cr now and in two weeks.  #CAD--patient leaning towards medical management.  #Aortic Calcification--noted on chest imaging September 22, 2019--Not Worsening.  Chronic.  Will monitor and treat underlying risk factors with medical and lifestyle interventions as warranted by balance between benefits and burdens.  #Back pain--to refer to Pain for discussion of epidural injection.  #allergic rhinitis--trial flonase  #Macrocytosis--  #Deconditioning--initiate home PT  #GERD--s/p Nisan fundoplication  #COPD--mixed--per PFT's from [place] .--albuterol prn.  May resume other inhalders.  #s/p rectocele repair  #s/p cholecystectomy  #Immunosupressed status--medication related.  Chronic. CTM  #Nocturnal hypoxemia--presumably related to MG crisis but has history of COPD.  Will order nocturnal home O2 study and consider PFT's/Pulmonary evaluation once  settled down.  May need to restart BREO inhaler.  #Myasthenia Gravis--s/p plasmapheresis x5 October 2020, no Thymoma, on Azathioprine 150 daily and mestinon 60 tid.  S[facility] Nicolas [facility].  Was considering Soliris.  Ordering IVIG and MRI.  Encourage ongoing neurology follow up.   #IBS--titrate miralax to comfort.    #Interstitial cystitis--stopping daily Trimethoprim 100 for UTI prophylaxis.  Using bladder instillations prn. OFF topical estrogen due to advice from neurologist re: MG.  To see Dr. Joesphine Must.  Suspect recent flare related to breast CA re-diagnosis.  Doubt pseudomonas bactiuria is pathogen at present.  WIll complete current meropenum.  #Allergic rhinitis--previously on nasal steroids.  #Breast CA--under care.  Patient continues to decline surgery.  Wondering about additional treatment options.  Will direct to med-onc for discussion.  #DM with CKD--stabe 3b--based on labs from [place] .  FOllow here.  If BP permits, may consider ARB BUT patient with h/o angioedema so will ONLY initiate this agent with great caution.  Refer to Ophthalmology.  #OA knees--previously getting hyaluronic acid injections.  #Anxiety  #Benzodiazepine Dependence--uncomplicated--chronic.  Encourage patient to decrease intake--ideally to abstaining but will work towards this goal over time.  Not likely to achieve this given ongoing concerns re: breast ca.  #Fibromyalgia--duloxetine as above.   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  #Statin intolerance      FOLLOW-UP     RTC     Future Appointments   Date Time Provider Department Center   07/23/2023 10:30 AM 2020 2ND FLOOR, LAB PATH [id number] [facility]   07/24/2023 11:40 AM Fairy Homer., MD HEM/ONC [facility]   07/25/2023  1:15 PM June Old, MD CRD DIS 2020 [facility]   09/19/2023 10:30 AM Jaqueline Mering, MD [facility] [address] [place]         The above plan of care, diagnosis, orders, and follow-up were discussed with the patient and/or surrogate. Questions related to this recommended plan of care were answered.    []   This patient is an established patient in my practice.   []   This patient lives in [place] .  []   I am a licensed physician in the state of [place] .     Signed: Volney Grumbles, MD 07/08/2023    [id number] billed today:    I have a longitudinal care relationship with this patient and am the focal point of ongoing patient care related to the serious chronic and complex condition(s) assessed today as noted above in the note.   I assume responsibility of ongoing management of said condition(s) over time.

## 2023-07-11 ENCOUNTER — Ambulatory Visit: Payer: BLUE CROSS/BLUE SHIELD

## 2023-07-11 ENCOUNTER — Inpatient Hospital Stay
Admit: 2023-07-11 | Discharge: 2023-07-11 | Disposition: A | Discharge status: Home / Self Care | Payer: BLUE CROSS/BLUE SHIELD | Source: Home / Self Care | Attending: Student in an Organized Health Care Education/Training Program

## 2023-07-11 DIAGNOSIS — S61211A Laceration without foreign body of left index finger without damage to nail, initial encounter: Secondary | ICD-10-CM

## 2023-07-11 LAB — CBC: MEAN CORPUSCULAR HEMOGLOBIN: 30.6 pg (ref 26.4–33.4)

## 2023-07-11 LAB — B-Type Natriuretic Peptide: BNP: 639 pg/mL — ABNORMAL HIGH (ref <100)

## 2023-07-11 LAB — Comprehensive Metabolic Panel
ASPARTATE AMINOTRANSFERASE: 24 U/L (ref 13–62)
CHLORIDE: 98 mmol/L (ref 96–106)

## 2023-07-11 LAB — Differential Automated: LYMPHOCYTE PERCENT, AUTO: 18.8 % (ref 0.00–0.10)

## 2023-07-11 MED ADMIN — LIDOCAINE HCL (PF) 1 % IJ SOLN: 5 mL | INTRADERMAL | @ 16:00:00 | Stop: 2023-07-11 | NDC 00143959525

## 2023-07-11 NOTE — ED Provider Notes
 [facility]  Emergency Department Service Report    [name] 88 y.o. female , presents with Finger Injury    Triage   Arrived on 07/11/2023 at 7:38 AM   Arrived by Walk-in [14]    ED Triage Vitals   Temp Temp Source BP Heart Rate Resp SpO2 O2 Device Pain Score Weight   07/11/23 0743 07/11/23 0743 07/11/23 0743 07/11/23 0743 07/11/23 0743 07/11/23 0743 07/11/23 0746 07/11/23 0746 07/11/23 0746   36.9 ?C (98.4 ?F) Oral 133/86 (!) 111 18 93 % None (Room air) Two 63.5 kg (140 lb)       Pre hospital care:       Allergies   Allergen Reactions    Beta Adrenergic Blockers Other (See Comments)     Avoid d/t Myasthenia Gravis  Avoid d/t Myasthenia Gravis      Levofloxacin Other (See Comments)     Flare of Myasthenia Gravis    Macrolides And Ketolides Other (See Comments)     Avoid d/t Myasthenia Gravis  Use with caution d/t Myasthenia Gravis  Use with caution d/t Myasthenia Gravis      Sulfa Antibiotics Other (See Comments)     May possibly have caused deafness in one ear  May possibly have caused deafness in one ear  other      Botulinum Toxins Other (See Comments)     Muscle weakness r/t Myasthenia Gravis  Muscle weakness r/t Myasthenia Gravis      Pollen Extract Wheezing    Statins Other (See Comments)     Muscle aches  Muscle aches  Muscle aches      Plastic Tape Itching and Other (See Comments)     Turns the skin red where applied         Initial Physician Contact       Initial Contact Completed?: Yes (07/11/23 0752)      History   The history is provided by the patient.      [name] is a 88 y.o. female with PMH of CHF, COPD, breast cancer, diabetes mellitus, renal disorder and asthma who presents with left index finger laceration while taking a cap off a scalpel-like tool with a razor earlier this morning. Patient has put ice on it and has attempted to wrap it with gauze. Patient reports that it has been actively bleeding for an hour and a half profusely. Patient is currently taking Lasix and 81 mg aspirin daily.                Past Medical History:   Diagnosis Date    Asthma     Breast cancer (HCC/RAF) 12/20/2018    CHF (congestive heart failure) (HCC/RAF)     COPD (chronic obstructive pulmonary disease) (HCC/RAF)     Diabetes mellitus (HCC/RAF)     Renal disorder         History reviewed. No pertinent surgical history.     Past Family History   Family history reviewed by me and there is no pertinent past family history related to the patient's current case and/or care.             Past Social History   she reports that she quit smoking about 40 years ago. Her smoking use included cigarettes. She has quit using smokeless tobacco. She reports that she is not currently sexually active. She reports that she does not drink alcohol and does not use drugs.  Physical Exam   Physical Exam  Constitutional:       General: She is not in acute distress.     Appearance: Normal appearance. She is not ill-appearing.   HENT:      Head: Normocephalic and atraumatic.      Mouth/Throat:      Mouth: Mucous membranes are moist.      Pharynx: Oropharynx is clear.   Eyes:      Extraocular Movements: Extraocular movements intact.      Pupils: Pupils are equal, round, and reactive to light.   Cardiovascular:      Rate and Rhythm: Normal rate and regular rhythm.      Pulses: Normal pulses.   Pulmonary:      Effort: Pulmonary effort is normal. No respiratory distress.      Breath sounds: Normal breath sounds.   Abdominal:      General: Abdomen is flat.      Palpations: Abdomen is soft.   Musculoskeletal:         General: Normal range of motion.      Cervical back: Normal range of motion and neck supple.      Comments: Left 2nd digiti with 1 cm ;laceration w small amount of bleeding over DIP region   No exposed tendon   Intact distal sensation and cap refill.   Skin:     General: Skin is warm and dry.      Capillary Refill: Capillary refill takes less than 2 seconds.      Comments: (+) Left second digit with 1 cm laceration with small amount of bleeding over DIP region.   No exposed tendon.  Intact distal sensation and cap refill.   Neurological:      General: No focal deficit present.      Mental Status: She is alert and oriented to person, place, and time.   Psychiatric:         Mood and Affect: Mood normal.         Behavior: Behavior normal.         ED Course          Laboratory Results     Labs Reviewed   COMPREHENSIVE METABOLIC PANEL - Abnormal; Notable for the following components:       Result Value    Total CO2 31 (*)     Glucose 108 (*)     Creatinine 1.77 (*)     Urea Nitrogen 34 (*)     All other components within normal limits   BNP - Abnormal; Notable for the following components:    BNP 639 (*)     All other components within normal limits   CBC (PERFORMABLE) - Abnormal; Notable for the following components:    Hematocrit 45.9 (*)     Red Cell Distribution Width-SD 50.4 (*)     All other components within normal limits   CBC & AUTO DIFFERENTIAL    Narrative:     The following orders were created for panel order CBC & Auto Differential.  Procedure                               Abnormality         Status                     ---------                               -----------         ------  ZOX[[id number]]                          Abnormal            Final result               Differential, Automated[[id number]]                          Final result                 Please view results for these tests on the individual orders.   DIFFERENTIAL, AUTOMATED (PERFORMABLE)   MTB-QUANTIFERON-GOLD PLUS ELISA       Imaging Results     No orders to display       Administered Medications     Medication Administration from 07/11/2023 0739 to 07/11/2023 1201         Date/Time Order Dose Route Action Action by Comments     07/11/2023 0858 PDT lidocaine PF 1% inj 5 mL 5 mL Intradermal Given by Other Alise Appl, RN --                       Procedures   Procedural Sedation  Lac Repair    Date/Time: 07/11/2023 8:59 AM    Performed by: Melynda Stagger., DO  Authorized by: Melynda Stagger., DO    Consent:     Consent obtained:  Verbal    Consent given by:  Patient    Risks, benefits, and alternatives were discussed: yes      Risks discussed:  Poor wound healing    Alternatives discussed:  No treatment  Universal protocol:     Patient identity confirmed:  Verbally with patient  Anesthesia:     Anesthesia method:  Local infiltration    Local anesthetic:  Lidocaine 1% w/o epi  Laceration details:     Location:  Finger    Finger location:  L index finger    Length (cm):  1  Pre-procedure details:     Preparation:  Patient was prepped and draped in usual sterile fashion  Exploration:     Hemostasis achieved with:  Direct pressure    Wound exploration: wound explored through full range of motion      Wound extent: no fascia violation noted, no muscle damage noted, no nerve damage noted and no tendon damage noted      Contaminated: no    Treatment:     Area cleansed with:  Saline    Amount of cleaning:  Extensive    Irrigation solution:  Sterile saline    Irrigation method:  Pressure wash    Debridement:  None  Skin repair:     Repair method:  Sutures    Suture size:  5-0    Suture material:  Nylon    Suture technique:  Simple interrupted    Number of sutures:  5  Approximation:     Approximation:  Close  Repair type:     Repair type:  Simple  Post-procedure details:     Dressing:  Bulky dressing    Procedure completion:  Tolerated well, no immediate complications      Medical Decision Making   [name] is a 88 y.o. female ith PMH of CHF, COPD, breast cancer, diabetes mellitus, renal disorder and asthma who presents with left index finger laceration while taking a cap off  a scalpel-like tool with a razor earlier this morning.   Patient arrives in the emergency department afebrile, hemodynamically stable on presentation for evaluation of finger laceration.    Superficial laceration as described above without obvious muscle or tendon injury.    Intact distal sensation.    No concern for foreign body or high mechanism trauma that would warrant x-ray imaging.    Laceration repaired as above with subsequent hemostasis achieved.    Labs drawn as per ordered by outpatient physician prior to discharge so they did not have to make additional stopped to lab.  Family aware that results not to be followed up by ED providers and to be followed up by primary physician.  Tdap up-to-date.    Medical Decision Making    Launch MDCalc MDM Tool   MDCalc MDM Module  Jul 11 2023 12:00 PM [Shauntee Karp]  Data:  - Test/documents/historian: 3+ tests ordered  Risk: lidocaine injection (Rx drug management)                  Problems  Clinical Impressions Complexity of problems addressed         Laceration of left index finger without foreign body without damage to nail, initial encounter (Primary) High:  []  Acute/chronic illness/injury with threat to life to bodily function  []  Chronic illness with severe exacerbation, progression, or side effects of treatment    Moderate:  []  Undiagnosed new problem with uncertain prognosis  []  Acute illness with systemic symptoms  [x]  Acute complicated injury    Data and Risk  Independent Historian []  Parent as child too young to provide hx []  Family/Caregiver due to AMS/dementia []  EMS due to medical acuity/trauma []  Family/EMS due to behavioral health concern and for collateral []    External Data Reviewed previous workup and mgmt of patient's Finger Injury via  [x]  Previous [place] Notes/Labs/Imaging []  External Notes/Labs/Imaging  which were non-contributory unless documented otherwise in HPI and ED Course   Considered but decided against  []  CT Head/C-spine given [place] CT/NEXUS/PECARN criteria []  CTA Chest to r/o PE given PERC/Well's criteria []  CT AP to r/o appendicitis given PAS score or family discussion []  Hospitalization due to *  []    Discussed w/ ext HCP []  Consults, PCP/outpt specialists, nursing home. See ED Course for details.   SDOH Affecting Dx/Tx []  Insurance limiting specialist referral []  Housing instability limiting outpt mgmt   []  Financial insecurity limiting medication access []  Substance/ETOH use []    Care de-escalation []  Shared decision-making regarding de-escalation of care (e.x. DNR) or foregoing hospitalization-level of care due to patient wishes/goals of care   Interpretations See ED Course. []     If applicable, parenteral controlled substances, drug therapies requiring intensive monitoring for toxicity, and prescription drug management are documented in the the Medications section of this note. If applicable, major and minor procedures are documented separately in Procedure Notes.    Clinical Impression           Laceration of left index finger without foreign body without damage to nail, initial encounter (Primary)      Prescriptions     Discharge Medication List as of 07/11/2023  9:26 AM          Disposition and Follow-up   Disposition:  Discharge [1]     Future Appointments   Date Time Provider Department Center   07/23/2023 10:30 AM 2020 2ND FLOOR, LAB PATH [id number]   07/24/2023 11:40 AM Fairy Homer., MD HEM/ONC [address]  Mon   07/25/2023  1:15 PM Tabibiazar, Ramin, MD CRD DIS 2020 [facility]   09/19/2023 10:30 AM Jaqueline Mering, MD [facility] [address] [place]/Cen       Follow up with:  Volney Grumbles., MD  [address] [place], [address]  [place] [address]  [phone]    Go in 2 days        Return precautions are specified on After Visit Summary.        Scribe Signature   I, Selinda Dales , have acted as a Stage manager for patient [name] on behalf of Dr. Lyndee Saner at 07/11/2023 at 7:48 AM. All documentation underwent a comprehensive review by the listed physician(s) and received their approval upon signing.                    Luevenia Saha A., DO  07/11/23 1201

## 2023-07-11 NOTE — ED Notes
 [facility] NOTIFICATION 07/11/2023 01:38 [name], [name] DOB: 03/20/1935 MRN: [id number]    [facility]'s patient encounter information:   AVW:[id number]  Account [id number]  Billing Account [id number]      Criteria Met      2 Visits in 30 Days    Security and Safety  No Security Events were found.  ED Care Guidelines  There are currently no ED Care Guidelines for this patient. Please check your facility's medical records system.        Prescription Drug Data  No Prescription Drug Data was found.    E.D. Visit Count (12 mo.)  Facility Visits   [facility] 2   [facility] 1   Total 3   Note: Visits indicate total known visits.     Recent Emergency Department Visit Summary  Date Facility [facility] Type Diagnoses or Chief Complaint    Jul 11, 2023  [facility].  [place]  Emergency     Jun 11, 2023  [facility] [place].  [place]  Emergency      1. Chronic obstructive pulmonary disease with (acute) exacerbation      1. Shortness of breath      1. Shortness of Breath      Mar 27, 2023  [facility].  [place]  Emergency      1. Diverticulitis of intestine, part unspecified, without perforation or abscess without bleeding      1. Unspecified abdominal pain        Recent Inpatient Visit Summary  Date Facility [facility] Type Diagnoses or Chief Complaint    Jun 11, 2023  [facility].  [place]  Geriatrics      1. Unspecified systolic (congestive) heart failure      1. Chronic obstructive pulmonary disease with (acute) exacerbation      2. Shortness of breath      3. Heart failure, unspecified      4. Acute on chronic combined systolic (congestive) and diastolic (congestive) heart failure      4. Shortness of Breath      5. Acute respiratory failure with hypoxia      6. Myasthenia gravis without (acute) exacerbation      7. Type 2 diabetes mellitus with diabetic chronic kidney disease      9. Sedative, hypnotic or anxiolytic dependence, uncomplicated Care Team  No Care Team was found.  [facility]  This patient has registered at the [facility] Emergency Department  For more information visit: [email or web address]   PLEASE NOTE:     1.   Any care recommendations and other clinical information are provided as guidelines or for historical purposes only, and providers should exercise their own clinical judgment when providing care.    2.   You may only use this information for purposes of treatment, payment or health care operations activities, and subject to the limitations of applicable [facility] Policies.    3.   You should consult directly with the organization that provided a care guideline or other clinical history with any questions about additional information or accuracy or completeness of information provided.    ? 2025 [facility] - [email or web address]

## 2023-07-11 NOTE — Discharge Instructions
 Emergency Department Discharge Instructions      Summary of your visit  You have been evaluated in the [facility] Emergency Department today for a laceration.  Your evaluation included a physical exam and laceration repair.    Wound Care: Sutures  Your laceration was repaired in the ED with sutures. Please keep the area surrounding the laceration clean and dry. Please keep the area out of the sunlight for the next 6 months to help prevent scarring. You should have the sutures removed in 7-10 days by your primary care physician, or at your local urgent care or ER. If you develop redness or swelling at the site of your laceration please come back to the ER for a wound check.    Medications  You can take 600mg  ibuprofen every 6 hours or tylenol 650mg  every 6 hours as needed for pain. If needed, you can alternate these medications so that you take one medication every 3 hours. For instance, at noon take ibuprofen, then at 3pm take tylenol, then at 6pm take ibuprofen.  Talk with your healthcare provider before taking any medicines if you have a chronic condition such as diabetes, liver or kidney disease, stomach ulcers, or digestive bleeding, or are taking blood thinners.      Follow-Up  Please follow up with your primary care physician in 7-10 days for suture removal. You can also return to the ER for this service.  You can find a primary care physician at [facility] by calling [phone].    If you do not have a Primary Care Physician, please call your insurance company or you may call [phone] to establish care with a [facility] physician.  If you are uninsured, please call 2-1-1 to find a free or low-cost clinic in your area. [employer] [place] is the central source for providing information and referrals for all health and human services in [facility]. Our 2-1-1 phone line is open 24 hours, 7 days a week, with trained [employer] prepared to offer help with any situation, any time. Our community services go far beyond phone referrals - explore our website to learn more. If you are calling from outside [facility] or cannot directly dial 2-1-1, you can call [phone].      Return to the Emergency Department if you experience:  Fevers 100.4 ?F or greater  Worsening or uncontrolled pain  Persistent nausea and vomiting  Discharge from your laceration  Swelling, redness, or warmth around your laceration  Numbness, weakness, or tingling  Any other concerning symptoms     Thank you for choosing  for your care. It was a pleasure taking part in your care today, and we wish you the best!

## 2023-07-12 LAB — MTB-Quantiferon Gold Plus ELISA: MTB-QUANTIFERON GOLD PLUS ELISA: NEGATIVE

## 2023-07-16 ENCOUNTER — Other Ambulatory Visit: Payer: MEDICARE

## 2023-07-16 ENCOUNTER — Telehealth: Payer: BLUE CROSS/BLUE SHIELD

## 2023-07-16 NOTE — Telephone Encounter
 Noted

## 2023-07-16 NOTE — Telephone Encounter
 Message to Practice/Provider      Message:  Pts daughter states that MD sent forms to personal email  Per dtr never received       [name]    Return call is not being requested by the patient or caller.    Patient or caller has been notified that their message will be reviewed within 1-2 business days.

## 2023-07-16 NOTE — Telephone Encounter
 Faxed to     [facility]  P [phone]  F [phone]

## 2023-07-16 NOTE — Telephone Encounter
 Message to Practice/Provider      Message: [name] from [facility] they received a referral and wanted to let the office know that they will accept pt for [facility] services    Return call is not being requested by the patient or caller.    Patient or caller has been notified that their message will be reviewed within 1-2 business days.

## 2023-07-17 ENCOUNTER — Telehealth: Payer: MEDICARE

## 2023-07-17 NOTE — Telephone Encounter
 I called and spoke with daughter [name]  to inform her that completed paperwork has been received and faxed.    Will confirm with the facility.

## 2023-07-18 ENCOUNTER — Inpatient Hospital Stay: Payer: Commercial Managed Care - Pharmacy Benefit Manager | Attending: Interventional Cardiology

## 2023-07-18 ENCOUNTER — Inpatient Hospital Stay
Admit: 2023-07-18 | Discharge: 2023-07-19 | Disposition: A | Discharge status: Home / Self Care | Payer: BLUE CROSS/BLUE SHIELD | Source: Home / Self Care | Attending: Interventional Cardiology

## 2023-07-18 DIAGNOSIS — I10 Essential (primary) hypertension: Secondary | ICD-10-CM

## 2023-07-18 DIAGNOSIS — R079 Chest pain, unspecified: Secondary | ICD-10-CM

## 2023-07-18 LAB — Basic Metabolic Panel
ANION GAP: 12 mmol/L (ref 8–19)
ANION GAP: 11 mmol/L (ref 8–19)
CHLORIDE: 102 mmol/L (ref 96–106)
ESTIMATED GFR 2021 CKD-EPI: 23 mL/min/{1.73_m2} (ref 3.6–5.3)

## 2023-07-18 LAB — APTT: APTT: <24 s — ABNORMAL LOW (ref 24.4–36.2)

## 2023-07-18 LAB — CBC: WHITE BLOOD CELL COUNT: 7.71 10*3/uL (ref 4.16–9.95)

## 2023-07-18 LAB — Prothrombin Time Panel: PROTHROMBIN TIME: 12.5 s (ref 11.5–14.4)

## 2023-07-18 LAB — Glucose,POC: GLUCOSE,POC: 102 mg/dL — ABNORMAL HIGH (ref 65–99)

## 2023-07-18 LAB — Creatinine,WB,POC: CREATININE,WB,POC: 1.5 mg/dL — ABNORMAL HIGH (ref 0.6–1.3)

## 2023-07-18 MED ADMIN — FENTANYL 50 MCG/ML INJ 2 ML (BULK CHARGE): @ 22:00:00 | Stop: 2023-07-19 | NDC 00409909422

## 2023-07-18 MED ADMIN — HEPARIN SODIUM 1000 UNIT/ML SOLN 10 ML VIAL (BULK CHARGE): @ 23:00:00 | Stop: 2023-07-19 | NDC 00409272002

## 2023-07-18 MED ADMIN — SODIUM CHLORIDE 0.9 % IV SOLN: 100 mL/h | INTRAVENOUS | Stop: 2023-07-19 | NDC 00338004904

## 2023-07-18 MED ADMIN — MIDAZOLAM HCL (PF) 2 MG/2ML IJ SOLN: @ 23:00:00 | Stop: 2023-07-19 | NDC 00409230517

## 2023-07-18 MED ADMIN — LIDOCAINE HCL (PF) 1 % IJ SOLN: @ 22:00:00 | Stop: 2023-07-19 | NDC 00143959525

## 2023-07-18 MED ADMIN — NITROGLYCERIN 100 MCG/ML 10 ML SYRINGE: @ 23:00:00 | Stop: 2023-07-19 | NDC 08252025010

## 2023-07-18 MED ADMIN — MIDAZOLAM HCL (PF) 2 MG/2ML IJ SOLN: @ 22:00:00 | Stop: 2023-07-19 | NDC 00409230517

## 2023-07-18 MED ADMIN — IODIXANOL 320 MG/ML IV SOLN: 25 mL | INTRA_ARTERIAL | @ 23:00:00 | Stop: 2023-07-18 | NDC 00407222316

## 2023-07-18 MED ADMIN — ASPIRIN 81 MG PO CHEW: 324 mg | ORAL | @ 15:00:00 | Stop: 2023-07-18 | NDC 66553000201

## 2023-07-18 MED ADMIN — SODIUM CHLORIDE 0.9 % IV BOLUS: 500 mL | INTRAVENOUS | @ 18:00:00 | Stop: 2023-07-18 | NDC 00338954304

## 2023-07-18 NOTE — H&P
 UPDATED H&P REQUIREMENT    WHAT IS THE STATUS OF THE PATIENT'S MOST CURRENT HISTORY AND PHYSICAL?   - The most current H&P is >24 hours and <30 days, and having examined the patient, I attest that there have been no changes. (This suffices as an update to the H&P).      REFER TO MEDICAL STAFF POLICIES REGARDING PRE-PROCEDURE HISTORY AND PHYSICAL EXAMINATION AND UPDATED H&P REQUIREMENTS BELOW:    [facility] Medical Staff Policy 200 - For Patients Undergoing Procedures Requiring Moderate or Deep Sedation, General Anesthesia or Regional Anesthesia    Contents of a History and Physical Examination (H&P):    The H&P shall consist of chief complaint, history of present illness, allergies and medications, relevant social and family history, past medical history, review of systems and physical examination, and assessment and plan appropriate to the patient's age.    For Patients Undergoing Procedures Requiring Moderate or Deep Sedation, General Anesthesia or Regional Anesthesia:    1. An H&P shall be performed within 24 hours prior to the procedure by a qualified member of the medical staff or designee with appropriate privileges, except as noted in item 2 below.    2. If a complete history and physical was performed within thirty (30) calendar days prior to the patient?s admission to the Medical Center for elective surgery, a member of the medical staff assumes the responsibility for the accuracy of the clinical information and will need to document in the medical record within twenty-four (24) hours of admission and prior to surgery or major invasive procedure, that they either attest that the history and physical has been reviewed and accepted, or document an update of the original history and physical relevant to the patient's current clinical status.    3. Providing an H&P for patients undergoing surgery under local anesthesia is at the discretion of the Attending Physician.     4. When a procedure is performed by a dentist, podiatrist or other practitioner who is not privileged to perform an H&P, the anesthesiologist?s assessment immediately prior to the procedure will constitute the 24 hour re-assessment.The dentist, podiatrist or other practitioner who is not privileged to perform an H&P will document the history and physical relevant to the procedure.    5. If the H&P and the written informed consent for the surgery or procedure are not recorded in the patient's medical record prior to surgery, the operation shall not be performed unless the attending physician states in writing that such a delay could lead to an adverse event or irreversible damage to the patient.    6. The above requirements shall not preclude the rendering of emergency medical or surgical care to a patient in dire circumstances.

## 2023-07-18 NOTE — Op Note
 Outpatient Cardiac Catheterization Procedure Note   PATIENT: [name]   MRN: [id number]   DOB: [birth year]   DATE OF SERVICE: 07/18/2023   REFERRING PRACTITIONER: Semaja Lymon, Kaylene Pascal., MD   PRIMARY CARE PROVIDER: Volney Grumbles., MD   ATTENDING PHYSICIAN: Treonna Klee, Kaylene Pascal., MD     OPERATORS:   1. Dr. Cindia Crease, MD, Interventional Cardiology Attending     PROCEDURES PERFORMED:   1. Selective Left and Right Coronary Angiogram through right radial artery access   2. Left Heart Catheterization   3. Supervision and interpretation of the hemodynamic and angiographic findings   4. Moderate procedural sedation, 30 minutes.   5. Ultrasound guided vascular access. This showed a patent right radial artery. Images stored on permanent record.     INDICATION FOR PROCEDURE: [name] is a pleasant 88 y.o. female who was brought into the cardiac catheterization laboratory due to HFrEF and cardiomyopathy of unknown etiology.     LABS:   Recent Labs     07/18/23  1436 07/18/23  1255 07/18/23  0811   HGB  --   --  13.5   PLT  --   --  335   K  --  4.1 4.4   BUN  --  41* 42*   CREAT 1.5* 2.07* 2.26*   INR  --   --  0.9      No results for input(s): ''TROPONIN'', ''BNP'' in the last 72 hours.   Lab Results   Component Value Date    CHOL 160 12/11/2022    CHOLHDL 55 12/11/2022    CHOLDLQ 76 12/11/2022    TRIGLY 181 (H) 12/11/2022      Lab Results   Component Value Date    TSH 2.5 04/17/2023    HGBA1C 6.0 (H) 04/17/2023        ANESTHESIA: Moderate Sedation   Under my supervision, medications were administered to achieved moderate sedation. Pulse oximetry, end-tidal CO2, heart rate, and blood pressure were monitored by an independent, trained observer.  I spent a total of 30 minutes of face-to-face sedation time with the patient.     ESTIMATED BLOOD LOSS: 25 cc   TOTAL CONTRAST USED: 25 ml of Visipaque   TOTAL FLUOROSCOPY TIME: 4:09 minutes   COMPLICATIONS: None     FINDINGS:   Selective Coronary Angiography:   1. Dominance: Right   2. LM: The left main was a large caliber vessel, that bifurcated to give rise to the LAD and left circumflex arteries. There was no significant disease in the LM.   3. LAD: The left anterior descending artery was a large caliber vessel, that gave rise to several septal and diagonal branches. There was no significant epicardial disease in the LAD or its branches, just mild luminal irregularities.   4. LCx: The left circumflex artery was a large caliber vessel, that gave rise to two obtuse marginals. There was no significant epicardial disease in the LCx or its branches.   5. RCA: The right coronary artery was a large caliber, dominant vessel, that gave rise to a moderate caliber posterolateral branch, and a moderate caliber posterior descending artery. There was no significant epicardial disease in the RCA or its branches.     Hemodynamics:   Left Heart Catheterization:   Opening aortic pressure: 109/53 mmHg   Baseline LVEDP 12-15 mmHg     DESCRIPTION OF PROCEDURE IN DETAIL:   After the risks, benefits, and alternatives were discussed and all questions answered, Kaitlyn  [name] gave informed consent. The patient was brought to the cardiac catheterization laboratory where she was positioned on the table. The patient was prepped and draped in the usual sterile fashion. Time out was performed at the presence of the attending physician.   The area of the right wrist was anesthetized with 1% lidocaine injection. Using a micropuncture needle, the right radial was accessed and a 5 french introducer sheath was inserted using a modified Seldinger technique. Over a guidewire, a 5 french JR4 catheter was advanced to the LV where pressures were obtained. We pulled back to the ascending aorta. The right coronary artery was selectively engaged and selective angiographic images were obtained in multiple views. Next, the left coronary artery was selectively engaged with 5 french JL 3.5 catheter and selective angiographic images were obtained in multiple views.   At the end of the procedure, a TR band was applied over the right wrist to achieve hemostasis after sheath removal. Patient tolerated the procedure well without any obvious complication. The patient was taken back to the PTU in stable condition.   Any complication during procedure: NONE     IMPRESSION:   No significant coronary artery disease  Non-ischemic cardiomyopathy  LVEDP of 12-15 mmHg.      RECOMMENDATIONS:   Hold Lasix. We will have her weight herself daily. If weight gain > 3 lbs in a week and restart.   We will repeat labs next week to track renal function  Recommend follow up with Dr. Tabibiazar in 1-2 weeks.     Please feel free to contact me with any questions or concerns you may have.   Author: Cindia Crease, MD 07/18/2023 3:57 PM

## 2023-07-18 NOTE — Discharge Instructions
 We did not find any coronary artery disease of any significance.   Hold Lasix. We will have you weigh yourself daily. If weight gain more than 3 lbs in a week can restart.   We will repeat labs next week to track kidney function. You can go to any [facility] lab. You do not need to be fasting.   Recommend follow up with Dr. Tabibiazar in 1-2 weeks.           CARE OF THE RADIAL CATHETERIZATION SITE    Home/Self Care Outpatient Instructions  Purpose:  The following special considerations outline radial catheter site care at home.  Special Considerations:  Expect mild tingling of hand and tenderness at the puncture site for up to 3 days. If this persists or other symptoms develop, notify your physician.  For 24 Hours following the procedure:   Do not subject hand/arm to any forceful movements for 24 hours i.e. supporting weight when rising from a chair or bed.   Do not drive a car for 24 hours. Someone else should drive you home.   The dressing on the puncture site may be removed after 24 hours and replaced with a Band-Aid.   You may shower on the day following your procedure. Do not take a tub bath or submerge the puncture site in water for 3 days following the procedure.  For 48 hours following the procedure:   Do not operate a lawnmower, motorcycle, chainsaw or all-terrain vehicle.   Do not lift anything heavier than 1 pound with affected arm.   Avoid excessive (extension/flexion) wrist movement.   Avoid heavy lifting with affected arm for 2 days following discharge.   Do not engage in vigorous exercise (i.e. tennis) using the affected arm for 2 days following discharge.  If Bleeding Occurs:   Sit down and apply firm pressure to site with your fingers x 10 minutes.   If the bleeding stops, continue to sit quietly, keeping your wrist straight for 2 hours.Notify your physician as soon as possible.   If bleeding does not stop after 10 minutes or if there is a large amount of bleeding or spurting, call 911 immediately. Do not drive yourself to the hospital.  Notify physician if:   Change in color or temperature of the hand or arm.   Redness, heat, or pus at the puncture site.   Chills or fever greater than 100.65F.   Tingling of hand and tenderness at the puncture site that persists greater than 3 days.  Going Home:  Resume Medications:  Hold Medications:   You will be advised about your test results, a follow-up appointment and drug prescriptions.   Look at your puncture site before your discharge and frequently when you get home.   No lotions or powders on affected area for 1 week.   Discuss returning to work with your physician. You may return to work in _________ days.   No lotions or powders on affected area for 1 week.   Drink plenty of liquids for the next two days, unless instructed differently.   We strongly suggest that a responsible adult be with the patient the rest of the day and also during the night for the patient?s protection and safety.    DISCHARGE INSTRUCTIONS  INTRAVENOUS SEDATION (Adult)  Here are the Discharge Instructions to follow after your procedure:  DIET:   Your diet may be based on your own preference:  Begin with clear liquids such as soda, apple juice, tea, etc. DO  NOT use a straw for drinking as this increases the amount of air swallowed and can increase bloating and stomach distress.  If liquids are tolerated, progress slowly to your usual diet.  NOTE: Intermittent nausea, with or without vomiting, is a common side effect following procedure with intravenous sedation. If this occurs, DO NOT eat or drink anything until it has subsided. If the nausea or vomiting persists for more than 24 hours contact your physician or go to the nearest emergency room.  ACTIVITY:    Sedative medications may linger in the body for 24-48 hours. It is not uncommon to feel somewhat drowsy and/or dizzy the remainder of the day following surgery. Avoid bending over as this may enhance dizziness. Do not drive an automobile or operate any heavy machinery for 24 hours. Do not make sudden movements when changing position, such as from a reclining to  upright position. Do not sign any legal documents or make any important decisions for 24 hours.  Do not drink alcohol for 24 hours.  ADDITIONAL INSTRUCTIONS:   It is not uncommon to tire easily and/or feel exhausted the day  following procedure. Do not be alarmed as this is normal. Plan activities that will allow for rest periods as needed.  For your safety, we strongly advise that a responsible adult remain with you for the rest of the procedure day and night. Also, you should not be responsible for the care of others (children/adults) for 24 hours post procedure and a responsible adult should be present to assume these responsibilities

## 2023-07-18 NOTE — Progress Notes
 Brief Cardiology Fellow Note    Dicussed code status with patient and her daughter at bedside prior to procedure. They are in agreement with being full code during procedure and the reverting to DNR after procedure. All questions answered. Case delayed due to AKI, likely from volume depletion. Giving fluid bolus and if Cr improved will proceed with angiogram. Otherwise may need admission for further evaluation and management.    [name], PGY-7  Interventional Cardiology Fellow ~ [facility]  07/18/2023  12:08 PM

## 2023-07-18 NOTE — Consults
 I had a long discussion with [name] and her daughter, [name] , this afternoon about the current state of [name]'s renal function. She appeared dry on our exam this morning and we gave her a bolus of saline. Her renal function improved. We went through specific CIN risk, including ~25% change of any renal injury, and a roughly 1% chance of that injury progressing to dialysis needs. Given her chronic kidney disease and unlikely chance of normalization of renal function, weighed against the possibility of obstructive and potentially treatable CAD, we opted to proceed with the angiogram. We discussed that we may keep her overnight for fluid hydration and repeat labs in the morning. All questions answered.

## 2023-07-19 ENCOUNTER — Other Ambulatory Visit: Payer: Commercial Managed Care - Pharmacy Benefit Manager

## 2023-07-23 ENCOUNTER — Ambulatory Visit: Payer: BLUE CROSS/BLUE SHIELD

## 2023-07-23 ENCOUNTER — Telehealth: Payer: BLUE CROSS/BLUE SHIELD

## 2023-07-23 DIAGNOSIS — C50911 Malignant neoplasm of unspecified site of right female breast: Secondary | ICD-10-CM

## 2023-07-23 DIAGNOSIS — C50912 Malignant neoplasm of unspecified site of left female breast: Secondary | ICD-10-CM

## 2023-07-23 DIAGNOSIS — Z170 Estrogen receptor positive status [ER+]: Secondary | ICD-10-CM

## 2023-07-23 NOTE — Telephone Encounter
 Call Back Request      Reason for call back: Pt was seen this weekend by [facility] nurse, Francoise Ishihara and she has several questions about pts medications. Please advise. Thank you.     Any Symptoms:  []  Yes  [x]  No      If yes, what symptoms are you experiencing:    Duration of symptoms (how long):    Have you taken medication for symptoms (OTC or Rx):      If call was taken outside of clinic hours:    [] Patient or caller has been notified that this message was sent outside of normal clinic hours.     [] Patient or caller has been warm transferred to the physician's answering service. If applicable, patient or caller informed to please call us  back if symptoms progress.  Patient or caller has been notified of the turnaround time of 1-2 business day(s).

## 2023-07-24 ENCOUNTER — Ambulatory Visit: Payer: MEDICARE | Attending: Hematology & Oncology

## 2023-07-24 NOTE — Patient Instructions
 Mammogram in August   See me in 3 months

## 2023-07-24 NOTE — Nursing Note
 Patient presented to clinic for blood draw. Labs drawn via venipuncture from patients left arm.

## 2023-07-24 NOTE — Progress Notes
 HEMATOLOGY/ONCOLOGY OUTPATIENT NOTE    PATIENT: [name]  MRN: [id number]  DOB: September 26, 1935  DATE OF SERVICE: 07/24/2023  Referring MD: Volney Grumbles., MD    Reason for Consultation/ Chief Complaint:  Bilateral breast cancer    HPI   [name] is a 88 y.o. female with PMH including myasthenia gravis, asymmetric hearing loss, fibromyalgia, DM with CKD, major depression, interstitial cystitis, IBS, COPD who presents to establish care for breast cancer.     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 07/19/2017 Screening detected right breast calcifications and distortion in the upper outer quadrant: Biopsy DCIS+ ALH +CSL; 4 cm apart, axilla negative.    Neoadjuvant tamoxifen started 10/30/17, stopped after 4-6 weeks due to dry mouth and mouth sores. Switched to anastrozole 01/29/18, discontinued because it made her throat dry. Declined additional antiestrogen therapy.   Moved on October of 2020 to [place]      [name] then noted left breast pain in April 2022.     Underwent a diagnostic bilateral breast imaging 06/13/2020 which revealed:    - Right breast: solid mass with indistinct margins measuring 14 x 4 x 10 mm in the right breast at 1 o'clock located 6 centimeters from the nipple with associated microclip,  not parallel solid mass with irregular margins measuring 8 x 4 x 5 mm seen in the right breast at 12 o'clock, upper outer quadrant located 6 centimeters from the nipple.    - Left breast: irregular mass with angular margins measuring 10 x 9 x 9 mm seen in the left breast at 11 o'clock located 5 centimeters from the nipple, axillary lymph node in the left axilla with cortical thickness measuring up to 6 mm.    R breast biopsy at 12:00, 6 cm from nipple on 07/13/2020 revealed invasive ductal carcinoma with lobular and focal cribriform features, grade 2 ER (+) PR (+) HER2 (-), Ki-67 5%    L breast biopsy at 11:00, 5 cm from nipple on 07/13/2020 revealed invasive ductal carcinoma with lobular features, grade 2 ER (+) PR (-) HER2 (-), Ki-67 10%     Breast MRI performed on 08/05/2020 that redemonstrated multifocal right breast malignancy and left breast IDC with an additional 4.3cm of NME that spanned nearly the entire left breast that appears suspicious.     [facility] genetic testing (-) for pathogenic variants.     08/17/2020 Patient presents to establish care for breast cancer. She has seen Dr. Hildy Lowers to discuss surgical treatment, and is quite worried about anesthesia with her myasthenia gravis . She is interested in neoadjuvant therapy     09/13/2020 Patient presents for follow-up of breast cancer. PETCT on 08/22/2020 with no evidence of FDG avid metastatic disease. DXA scan on 08/26/2020 demonstrated osteopenia of the left femoral neck and total hip.     10/28/2020 Patient presents for follow-up of breast cancer. Labs on 09/13/2020 showed WBC 5.3, hgb 11.4, platelets 300, ANC 3.58. Endorses fatigue, frequent diarrhea. BM anytime she eats, usually about 3 times a day. Diarrhea is more frequent in the morning. Also reports hot flashes. Breasts are tender, using a larger bra size.     11/25/2020 Patient presents for follow-up of breast cancer. Labs on 9/2/202 showed WBC 6.7, hgb 12.0, platelets 384,  ANC 4.80. Breast MRI on 11/18/2020 with stable to slightly decreased size of known biopsy proven malignancies in the right breast, decreased size of the irregular mass  with microclip at 11:00, 5 cm from the nipple, consistent with known malignancy, nonmass enhancement spanning 40 mm has also slightly decreased in size and now demonstrates subthreshold kinetics, this remains suspicious, previously described oval enhancing mass at 4:00 is no longer identified.     01/03/2021 Patient presents for follow-up of breast cancer. Labs on 12/20/2020 showed WBC 6.4, hgb 9.7, platelets 278, ANC 5.00.     Hospital admission 10/23-10/25/2022. P/w atypical chest pain x 2-3 days with new LBBB but negative troponin and relief w/ antacid medications most c/f upper GI pathology such as severe GERD, PUD, or gastritis. CT AP without acute abnormality. Initiated pantoprazole 40 mg PO bid with improvement in symptoms with PPI and GI cocktail w/maalox. Patient able to tolerate all meals without recurrent chest pain. GI was consulted and given improvement in symptoms, EGD deferred. Continue Maalox q4h PRN. H pylori stool Ag discontinued given long term PPI use. Continue dexelant outpatient. Ondansetron 4 mg PO q6hr PRN Rx for nausea. Outpatient GI follow-up.     10/18/2021 Patient presents for follow-up of breast cancer. Labs on 09/01/2021 showed WBC 4.94, hgb 11.8, platelets 342. Breast MRI 05/03/2021 with Interval decrease in size of known malignancies in the right breast with trace residual enhancement, Grossly stable irregular mass at 11 o'clock corresponding to biopsy-proven malignancy,  Interval resolution of previously described regional nonmass enhancement in the upper inner, upper outer, and lower outer quadrants. She admits to persistent diarrhea associated with significant weight loss. She often does not eat during the day because she is fearful of leaving the house d/t diarrhea. She does not take much imodium. The diarrhea is dependent on what she eats. Has longstanding hx of IBS. Diarrhea is improved when she takes dexilant but was unable to obtain the dexilant in the preferred form for a while. She denies B symptoms. Admits to occasional vomiting associated with the diarrhea. Continues with IVIG for myasthenia gravis. Has decided she would not like to pursue breast surgery due to age, co morbidities, anaesthetic risks.     11/14/2021 Patient presents for follow-up of breast cancer. Labs on 10/18/2021 showed WBC 4.7, hgb 12.7, platelets 379, ANC 3.81, CEA 5.00. CT chest 11/13/2021 (-). Met with Dr. Felice Hora today. Held Arimidex from last visit until yesterday. Diarrhea resolved while she held the Anastrazole. She actually became constipated.     01/11/2022 Patient presents for follow up of breast cancer. MR abd/pelv 11/23/2021 (-). Labs on 01/03/2022 showed WBC 5.37, hgb 12.8, PLT 339. Switched to exemestane last visit    05/22/2022 Patient presents for follow up of bilateral breast cancer. Labs on 03/27/2022 showed WBC 5.89, hgb 12.8, platelets 358. Reports diarrhea has resolved since switching to exemestane    12/25/2022 Patient presents for follow up of bilateral breast cancer. Diagnostic mammo/US  10/17/2022 stable.     07/24/2023 Patient presents for follow up of bilateral breast cancer. Diagnostic mammo/US  04/26/2023 stable. Recently moved to [place] (living facility). S/p angiogram 5/22 with Dr. Frieda Jew. Lasix on hold        ECOG performance status: 0  PAST HISTORY   PMH:  Patient Active Problem List    Diagnosis Date Noted    Epiretinal membrane, bilateral 05/18/2022     Not visually significant  Monitor      PVD (posterior vitreous detachment), bilateral 05/18/2022     No evidence of lesions predisposing to retinal detachment. Retinal detachment precautions reviewed with patient x3.    Discussed warning signs of retinal tear and retinal detachment:  If patient experiences worsening flashes, worsening floaters, a fixed-visual field deficit, or any deterioration of vision contact the office immediately even after regular hours.       Chorioretinal scars, bilateral 05/18/2022     Stable  Monitor      Heart failure with reduced ejection fraction LVEF <=40% (HCC/RAF) 11/25/2021    Atypical chest pain 12/20/2020    Acute chest pain 12/18/2020    Immunosuppressed status 03/21/2020    HTN (hypertension) 10/31/2019    Aortic calcification 09/25/2019    Refractive error 04/28/2019     07/03/2022 - Dispensed spectacle prescription for distance and optional near.  Dispensed retaine for visual stability, particularly when eyes are fatigued      Ptosis of eyelid 04/28/2019    Pseudophakia of both eyes 04/28/2019     Patient endorses history of movement during CEIOL with atypical lens placment right eye. Lens appears stable and centered right eye, slightly superior displaced but stable right eye. Monitor      Benzodiazepine dependence (HCC/RAF) 12/20/2018    Breast cancer (HCC/RAF) 12/20/2018    Spondylosis without myelopathy or radiculopathy, lumbar region 11/28/2018    Anxiety 11/27/2018    CKD (chronic kidney disease), stage III (HCC/RAF) 11/27/2018    Nocturnal hypoxemia 04/07/2018     Last Assessment & Plan:   We discussed potential impact of hypoxemia from her COPD on other body function  Plan-overnight oximetry      Major depression, recurrent, chronic (HCC/RAF) 12/16/2017    Diabetes mellitus (HCC/RAF) 12/16/2017    Osteoarthritis of knee 05/17/2017    H/O arthroscopy 03/06/2017    Myasthenia gravis with exacerbation (HCC/RAF) 12/18/2016     05/18/2022  Stable with treatment, no double vision. Referral to optom for glasses. All risks, benefits, and alternatives discussed with patient. Follow up when needed      Vaginal atrophy 08/16/2016    Acute medial meniscal tear, left, subsequent encounter 07/18/2016    Renal cyst 03/12/2016    Asthmatic bronchitis, mild persistent, with acute exacerbation 12/29/2015     Last Assessment & Plan:   Continue rescue inhaler as needed. Try adding sample Bevespi maintenance inhaler to see if this helps.  She is currently on prednisone 20 mg daily maintenance and I don't think adding a steroid inhaler component will make any difference.      Urethral caruncle 07/26/2015    Chronic cholecystitis 01/14/2015    Angioedema 05/23/2014     Attributed to injected methylprednisolone    Last Assessment & Plan:   We discussed limited ability to test for possible triggers. We are asking our labs sources about ability to test against methylprednisolone for injection.  A different brand might be worth trying. She can try pre-medicating with an antihistamine.      Chronic interstitial cystitis 10/01/2011    Insomnia 05/31/2007     Qualifier: Diagnosis of   By: Linder Revere MD, Clinton D    Last Assessment & Plan:   I'm concerned that some of her sleep problems may reflect obstructive sleep apnea based on her palate and which she can tell me about her sleep patterns without a witness. We also want to know about oxygenation during sleep.  Plan-schedule sleep study  Qualifier: Diagnosis of   By: Linder Revere MD, Clinton D      Last Assessment & Plan:   I'm concerned that some of her sleep problems may reflect obstructive sleep apnea based on her palate and which she can tell me about her sleep patterns  without a witness. We also want to know about oxygenation during sleep.  Plan-schedule sleep study      COPD mixed type (HCC/RAF) 04/30/2007     Office Spirometry 03/16/2016-severe obstructive airways disease FVC 1.59/59%, FEV1 0.98/49%, ratio 0.62, FEF 25-75% 0.42/28%  PFT 04/16/16-severe obstructive airways disease with slight response to bronchodilator, severe diffusion defect. FEV1/FVC 0.64, DLCO 45%    Last Assessment & Plan:   Severe obstructive airways disease. She can't hear herself wheeze.  Plan-try samples of Trelegy instead of Anoro for comparison    Overview:   Office Spirometry 03/16/2016-severe obstructive airways disease FVC 1.59/59%, FEV1 0.98/49%, ratio 0.62, FEF 25-75% 0.42/28%  PFT 04/16/16-severe obstructive airways disease with slight response to bronchodilator, severe diffusion defect. FEV1/FVC 0.64, DLCO 45%    Last Assessment & Plan:   Severe obstructive airways disease. She can't hear herself wheeze.  Plan-try samples of Trelegy instead of Anoro for comparison  Office Spirometry 03/16/2016-severe obstructive airways disease FVC 1.59/59%, FEV1 0.98/49%, ratio 0.62, FEF 25-75% 0.42/28%  PFT 04/16/16-severe obstructive airways disease with slight response to bronchodilator, severe diffusion defect. FEV1/FVC 0.64, DLCO 45%    Last Assessment & Plan:   Severe COPD but mainly emphysema with limited bronchospasm component that could be addressed with bronchodilators.  Plan-try sample Bevespi as a maintenance controller, refill pro-air      Esophageal reflux 04/30/2007     History of fundoplication     Last Assessment & Plan:   Emphasized continued attention to antireflux measures. Reminded that reflux can be a significant trigger for irritable airways, cough and wheeze.  History of fundoplication       Last Assessment & Plan:   Emphasized continued attention to antireflux measures. Reminded that reflux can be a significant trigger for irritable airways, cough and wheeze.      Eustachian tube dysfunction 04/30/2007     Annotation: decreased hearing  Qualifier: Diagnosis of   By: Rochelle Chu CNA/MA, Jessica       Last Assessment & Plan:   She is nearly deaf on a chronic basis. Management is mostly by her ENT physicians.      Fibromyalgia 04/30/2007     Qualifier: Diagnosis of   By: Rochelle Chu CNA/MA, Lieutenant Reese: Diagnosis of   By: Rochelle Chu CNA/MA, Jessica      Seasonal and perennial allergic rhinitis 04/30/2007     Qualifier: Diagnosis of   By: Rochelle Chu CNA/MA, Jessica     Last Assessment & Plan:   She is now on prednisone 20 mg daily. I suggested she wait until her myasthenia status is stabilized. Then if she needs to she couldn't seek evaluation at one of the allergy practices in town as discussed, since our vaccine program will be closing.  Qualifier: Diagnosis of   By: Rochelle Chu CNA/MA, Jessica       Last Assessment & Plan:   She is now on prednisone 20 mg daily. I suggested she wait until her myasthenia status is stabilized. Then if she needs to she couldn't seek evaluation at one of the allergy practices in town as discussed, since our vaccine program will be closing.     Tobacco: smoked  For 30 years     Surgical History:  No past surgical history on file.    Gyn Hx:  Menses 15   G2P2- first birth 6  Menopause :50's   Menopausal status: Postmenopausal  Hormone therapy use: None      Family and Social History  No family history on file.  Social History     Tobacco Use    Smoking status: Former     Current packs/day: 0.00     Types: Cigarettes     Quit date: 1985     Years since quitting: 40.4    Smokeless tobacco: Former   Substance Use Topics    Alcohol use: Never       MEDS     No outpatient medications have been marked as taking for the 07/24/23 encounter (Office Visit) with Fairy Homer., MD.     Allergies   Allergen Reactions    Beta Adrenergic Blockers Other (See Comments)     Avoid d/t Myasthenia Gravis  Avoid d/t Myasthenia Gravis      Levofloxacin Other (See Comments)     Flare of Myasthenia Gravis    Macrolides And Ketolides Other (See Comments)     Avoid d/t Myasthenia Gravis  Use with caution d/t Myasthenia Gravis  Use with caution d/t Myasthenia Gravis      Sulfa Antibiotics Other (See Comments)     May possibly have caused deafness in one ear  May possibly have caused deafness in one ear  other      Botulinum Toxins Other (See Comments)     Muscle weakness r/t Myasthenia Gravis  Muscle weakness r/t Myasthenia Gravis      Pollen Extract Wheezing    Statins Other (See Comments)     Muscle aches  Muscle aches  Muscle aches      Plastic Tape Itching and Other (See Comments)     Turns the skin red where applied       PHYSICAL EXAM     Last Recorded Vital Signs:    07/24/23 1140   BP: 123/72   Pulse: (!) 100   Resp: 18   Temp: 36.3 ?C (97.3 ?F)   SpO2: 93%          Body mass index is 23.06 kg/m?Aaron Aas  Wt Readings from Last 3 Encounters:   07/24/23 62.9 kg (138 lb 9.6 oz)   07/18/23 62.5 kg (137 lb 12.6 oz)   07/11/23 63.5 kg (140 lb)       Review of Systems:  Pertinent items are noted in HPI.      Physical Exam:  General: No acute distress. Appears well-developed, well-nourished and close to stated age.   Head: Normocephalic, atraumatic.  Eyes: Sclera anicteric. EOMI.  ENT: Hearing grossly normal bilaterally. Oropharynx is clear, mucus membranes are moist.  No oral ulcers noted. Good dentition.  Neck: Supple. Trachea midline.   Cardiac: Regular rate and rhythm. Normal S1, S2. No murmurs, rubs, or gallops.  Respiratory: Clear to auscultation bilaterally. No wheezes, rales, or rhonchi noted. Respiratory effort appears normal.   Abdomen: Soft, nontender and nondistended. Bowel sounds are present and normoactive. No organomegaly is appreciated.  Musculoskeletal: No edema. No cyanosis. Extremities are warm and well-perfused.   Extremities: Warm. No edema. No cyanosis.  Neurologic: Gait appears normal. Sensation intact to light touch in all four extremities. Oriented to person, place and time.   Hematologic: No bruising, purpura or petechiae are noted.   Dermatologic: Skin intact.  No rashes appreciated.   Lymphatic: No palpable cervical, supraclavicular, axillary or inguinal adenopathy appreciated.   Psychiatric: Affect appropriate.  Pleasant and conversant.     Breast exam: +mass in the left breast, 11:00 position, 5 cmfn measuring 2 x 2 cm      Recent Labs:  Lab Results   Component Value Date  WBC 7.71 07/18/2023    HGB 13.5 07/18/2023    HCT 41.7 07/18/2023    MCV 93.5 07/18/2023    PLT 335 07/18/2023      Lab Results   Component Value Date    NA 137 07/18/2023    K 4.1 07/18/2023    CL 103 07/18/2023    CO2 23 07/18/2023    CREAT 1.5 (H) 07/18/2023    BUN 41 (H) 07/18/2023     Lab Results   Component Value Date    ALT 13 07/11/2023    AST 24 07/11/2023    ALKPHOS 92 07/11/2023    BILITOT <0.2 07/11/2023     Lab Results   Component Value Date    CALCIUM 8.7 07/18/2023    PHOS 3.5 06/14/2023       Pertinent imaging:  04/26/2023 Diagnostic mammo/US   IMPRESSION:  Finding 1:  Stable solid masses in the right breast at 1 o'clock and 12 o'clock are known  malignancies. Surgical and oncologic consultation is ongoing. Clinical management of known  malignancy is recommended.  Finding 2:  Stable complicated cyst versus benign appearing solid mass in the right breast at 12  o'clock is benign.  Finding 3:  Microclip in the upper outer quadrant of the right breast at 10 o'clock located 2  centimeters from the nipple corresponds to known high risk lesion. Surgical consultation continues  to be recommended.  Finding 4:  Irregular mass in the left breast at 11 o'clock is a known malignancy. This has both  mammographically and sonographically slightly increased in size from 09/2022. Surgical and oncologic  consultation is ongoing. Clinical management of known malignancy is recommended.  BI-RADS Category 6:  Known Biopsy-Proven Malignancy     10/18/2022 Diagnostic mammogram/US   IMPRESSION:  Finding 1:  Solid mass in the right breast at 1 o'clock and 12 o'clock are known malignancies.  Surgical and oncologic consultation is ongoing. Clinical management of known malignancy is  recommended.  Finding 2:  Microclip in the right breast at 10 o'clock located 2 centimeters from the nipple  corresponds to known high risk lesion. Surgical consultation is ongoing.  Finding 3:  Complicated cyst versus benign appearing solid mass in the right breast at 12 o'clock is  probably benign. Sonographic follow-up in 6 months is recommended.  Finding 4:  Irregular mass in the left breast at 11 o'clock is a known malignancy. This has  mammographically increased in size from 07/13/2020 and sonographically slightly increased in size  from 01/26/2022. Surgical and oncologic consultation is ongoing. Clinical management of known  malignancy is recommended.  Finding 5:  Vague hypoechoic area in the left breast at 12 o'clock is stable from 06/13/2020 and is  benign.  Finding 6:  Complicated cyst versus benign appearing solid mass in the left breast at 1 o'clock is  stable from 06/13/2020 and is benign.  BI-RADS Category 6:  Known Biopsy Proven Malignancy    01/26/2022 Mammo/US  diagnostic, bilat  IMPRESSION:  1. Biopsy proven malignancies of right mass at 1:00 and 12:00 both at 6 cm FN (X and ribbon clip)  have decreased in size sonographically. Right breast area with coil microclip stable in appearance  on mammogram. Appropriate action should be taken based on pathology results.  2. Increasing calcifications right breast compared to prior mammogram. Unclear if this is  progression of disease as prior MRI demonstrates trace residual enhancement. If breast conservation  surgery is of interest, stereotactic biopsy may be performed.  3. RIght  breast probably benign mass at 12:00 6 cm FN stable, recommend follow up US  in 6 months.  4. L breast 11:00 5 cm FN biopsy proven malignancy has slightly increased in size sonographically  compared to prior ultrasound.  5. Probably benign masses of left breast at 12:00 3 cm FN and 1:00 1 cm FN are stable, recommend  follow up US  in 6 months.  6. Stable thickened left axillary lymph node, continued surgical/oncologic consultation is  recommended.    11/13/2021 CT chest wo  History of bilateral breast cancer without definite metastatic disease in the thorax     05/03/2021 MR breast  1. Right breast:   *  Interval decrease in size of known malignancies in the right breast with trace residual enhancement. Recommend continued surgical and oncological management. BI-RADS CATEGORY 6 - KNOWN BIOPSY-PROVEN MALIGNANCY.      2. Left breast:  *  Grossly stable irregular mass at 11 o'clock corresponding to biopsy-proven malignancy. Recommend continued surgical and oncological management. BI-RADS CATEGORY 6 - KNOWN BIOPSY-PROVEN MALIGNANCY.   *  Interval resolution of previously described regional nonmass enhancement in the upper inner, upper outer, and lower outer quadrants. Recommend continued surgical management. BI-RADS CATEGORY 4 - SUSPICIOUS.      3. Additional findings:  *  STIR hyperintense fluid collection in the right glenohumeral joint/subacromial region may represent bursitis. Correlate with symptoms/physical exam.     OVERALL ASSESSMENT: BI-RADS CATEGORY 4 - SUSPICIOUS.    05/03/2021 MR cervical spine  Redemonstrated congenital cervical spinal canal stenosis with superimposed multilevel degenerative changes, which are mildly progressive compared to prior as above. No evidence of cord compression or cord signal abnormality.     03/14/2021 NM stress test  1) There is a moderate-severe, fixed perfusion defect in the distal anteroseptal wall and apex suggestive for a prior MI.   No scintigraphic evidence for stress-induced ischemia.    2) Normal left ventricular size and reduced global LV systolic function, EF 36%. There are regional wall motion abnormalities present as indicated above.  3) For physiologic and electrocardiographic findings, please refer to a separate report.  4) There are no prior studies available for comparison.      12/18/2020 CT abd/pelvis wo contrast  1.  No acute CT abnormality in the abdomen or pelvis.  2.  Moderate hiatal hernia with postsurgical changes likely related to reported prior Nissen fundoplication.  3.  Left-sided diverticulosis without evidence diverticulitis.  4.  History of bilateral breast cancer without evidence of metastatic disease within the abdomen or pelvis.    12/18/2020 CTA chest   1.  No acute aortic process. No acute process within the chest.  2.  History of bilateral breast cancer without evidence of intrathoracic metastatic disease.  3.  Additional findings as above.    11/18/2020 MRI breast bilat  1. Right breast:   *  Stable to slightly decreased size of known biopsy proven malignancies in the right breast. BI-RADS CATEGORY 6 - KNOWN BIOPSY-PROVEN MALIGNANCY. Recommend continued surgical and oncologic management.      2. Left breast:  *  Decreased size of the irregular mass with microclip at 11:00, 5 cm from the nipple, consistent with known malignancy. BI-RADS CATEGORY 6 - KNOWN BIOPSY-PROVEN MALIGNANCY. Recommend continued surgical and oncologic management.   *  Nonmass enhancement spanning 40 mm has also slightly decreased in size and now demonstrates subthreshold kinetics. This remains suspicious. If breast conservation is desired, an MRI guided biopsy is recommended. BI-RADS CATEGORY 4 -  SUSPICIOUS.  *  Previously described oval enhancing mass at 4:00 is no longer identified. BI-RADS CATEGORY 2.     OVERALL ASSESSMENT: BI-RADS CATEGORY 4 - SUSPICIOUS.    08/26/2020 DXA lumbar spine/hip  -----------------------------------------------------------------   Region                   BMD    T-score  Z-score   Classification   -----------------------------------------------------------------   AP Spine(L1-L4)          1.062    0.1      2.9       Normal   Femoral Neck (Left)      0.723   -1.1      1.3       Osteopenia   Total Hip (Left)         0.725   -1.8      0.4       Osteopenia   -----------------------------------------------------------------   Impression: The BMD for  the AP Spine(L1-L4) decreased, changing by -2.4% since the last DXA exam.     08/22/2020 PETCT  1.  Mild to moderate left greater than right FDG activity associated with small soft tissue densities in the bilateral breasts in the regions marked by breast biopsy clips.   2.  No evidence of FDG avid metastatic disease.    06/19/2019 US  pelvis  Small subserosal uterine fundal fibroid. Otherwise normal postmenopausal pelvic ultrasound.        Pertinent pathology:  03/21/2022 Tissue exam  FINAL DIAGNOSIS  BREAST, RIGHT, CALCIFICATIONS, 10:00, 2 CM FROM NIPPLE (MAMMO-GUIDED CORE  BIOPSY):  - Atypical ductal hyperplasia (ADH)  - Background breast with fibrosis, microcysts, and apocrine metaplasia  - Calcifications are present within ADH, apocrine metaplasia, and benign stroma  - No carcinoma identified (multiple deeper levels examined     08/02/2020 [facility]      07/13/2020 Tissue exam  A. BREAST, RIGHT, MASS, 12:00, 6 CM FROM NIPPLE (NEEDLE CORE BIOPSY):  - Invasive ductal carcinoma with lobular and focal cribriform features, grade 2 (50% of biopsy; longest segment 4 mm)             Modified Bloom and Richardson score: 6 of 9                        Tubule formation:                3 Nuclear pleomorphism:       2                        Mitotic score:                       1  - Ductal carcinoma in situ (DCIS), low nuclear grade, cribriform type with focal central necrosis  - Breast biomarkers: See below  - ERBB2 (HER2) FISH: Negative for HER2 gene amplification      B.  BREAST, LEFT, MASS, 11:00, 5 CM FROM NIPPLE (NEEDLE CORE BIOPSY):  - Invasive ductal carcinoma with lobular features, grade 2 (50% of biopsy; longest involved segment 6 mm)             Modified Bloom and Richardson score: 6 of 9                        Tubule formation:  3                        Nuclear pleomorphism:       2                        Mitotic score:                       1  - Ductal carcinoma in situ (DCIS) is not identified  - Breast biomarkers: See immunohistochemistry report below  - ERBB2 (HER2) FISH: Negative [based on IHC (2+) and FISH, see comment].     COMMENT: It is uncertain whether patients with an average of >4.0 and <6.0 HER2 signals per cell and a HER2/CEP17 ratio of <2.0 benefit from HER2-targeted therapy in the absence of protein overexpression (IHC 3+). If the specimen test result is close to the [facility] ratio threshold for positive, there is a higher likelihood that repeat testing will result in different results by chance alone. Therefore, when [facility] results are not 3+ positive, it is recommended that the sample be considered negative without additional testing on the specimen.    BREAST BIOMARKER REPORT     BLOCK:  A1  FIXATIVE:  Formalin      RESULT ER/PR:    ESTROGEN   RECEPTOR PROGESTERONE RECEPTOR   Antibody Clone SP1 Clone 636   %Tumor Staining >95% >95%   Intensity (1+ to 3+) 3+ 3+      RESULT HER-2/neu IHC assay (utilizing FDA-approved DAKO Hercep Test): Arch Pathol Lab Med [phone], ASCO/CAP HER2 Testing in Breast Cancer Update - Fredrick Jenkins et al     Test Score: 1+     HER2/neu:  Negative     RESULT Ki-67 (Clone MIB1):  5%     BREAST BIOMARKER REPORT     BLOCK:  B1  FIXATIVE:  Formalin RESULT ER/PR:    ESTROGEN   RECEPTOR PROGESTERONE RECEPTOR   Antibody Clone SP1 Clone 636   %Tumor Staining >95% 0%   Intensity (1+ to 3+) 3+ NA      RESULT HER-2/neu IHC assay (utilizing FDA-approved DAKO Hercep Test): Arch Pathol Lab Med 2018:1-20, ASCO/CAP HER2 Testing in Breast Cancer Update - Christal [address] al     Test Score: 2+     HER2/neu:  Equivocal     RESULT Ki-67 (Clone MIB1):  10%    07/19/2017 Outside pathology        ASSESSMENT & PLAN   BAYLYN SICKLES is a 88 y.o. female presents for     1. 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 She elected not to have surgery at that time. She took tamoxifen -->anastrozole for a short period of time, but discontinued endocrine therapy due to side effects.   - R breast biopsy at 12:00, 6 cm from nipple on 07/13/2020 revealed invasive ductal carcinoma with lobular and focal cribriform features, grade 2 ER (+) PR (+) HER2 (-), Ki-67 5%  - L breast biopsy at 11:00, 5 cm from nipple on 07/13/2020 revealed invasive ductal carcinoma with lobular features, grade 2 ER (+) PR (-) HER2 (-), Ki-67 10%  - We had a long discussion about biology and treatment of breast cancer, with emphasis that cure can only be attained with multimodality treatments such as  surgery/ +/- radiation and systemic ( endocrine) therapy .  - We have discussed that given her concern for anesthesia with Myasthenia Gravis, we can try neoadjuvant endocrine therapy with close observation of side effects.  - Letrozole 2.5 mg po daily 08/17/2020 --> 10/28/2020 discontinued due to diarrhea  - Tumors are not palpable on exam, and she would have to be followed with imaging   -  PETCT on 08/22/2020 with no evidence of FDG avid metastatic disease.   - Arimidex 10/28/2020 ---> 11/13/2021 d/c due to diarrhea   - Patient has opted against breast surgery d/t age, co morbidities, anaesthetic risk iso myasthenia gravis  - She would like to explore alternatives to surgery such as radiation. Discussed case with rad/onc team and will obtain repeat breast imaging. She consulted with Dr. Felice Hora who advised that she is not currently a candidate for RT  - MR abd/pelv 11/23/2021 (-)   - Will try switching to Exemestane 25mg  po daily to evaluate for improvement in diarrhea- suspect class effect, but will try 11/14/2021 --->  - Diagnostic bilateral MMG/US  01/26/2022 with stable findings. Repeat in 6 months   - S/p stereotactic bx of right breast mass 03/21/2022. Pathology shows ADH   - Diagnostic mammo/US  10/17/2022 with stable findings. Repeat imaging in 6 months    07/24/2023   - Continue exemestane 25 mg po daily   - Diagnostic bilateral breast mammo/US  04/26/2023 stable  - Plan to repeat imaging in 6 mos, 09/2023   - RTC in 3 months  - Labs from 5/22 reviewed        2.  Diarrhea  - Longstanding, hx of IBS, but exacerbated by AI use, similar on letrozole and arimidex.  - Consult with Dr. Nulsen 02/10/2021. May be related to aromatase inhibitor. However she states it has been worse in the last 1-2 months and this medication class was started earlier in the year (attributed diarrhea to letrozole, swithced to arimidex in Sept). Will r/o infection, check FC. Can consider SIBO evaluation, colonoscopy for microscopic colitis.   - Held anastrazole 10/18/2021-->11/13/2021 with resolution of diarrhea.   - MR Abd/Pelv for further assessment given >20lbs weight loss associated with diarrhea 9/28 (-) for metastatic disease.   - Strongly encouraged patient to submit stool studies ordered by Dr. Nulsen  - Will start with 1/2 tablet imodium q AM and increase to 1 tablet daily as needed  - Consider lomotil if imodium is ineffective or not tolerated  - Switched to exemestane 11/14/2021        3.  Myasthenia gravis   - Followed by Dr. Karasozen    - Continue azathioprine, IVIG, mestinon     4. Bone density  - DXA scan on 08/26/2020 demonstrated osteopenia of the left femoral neck and total hip    07/24/2023  - Repeat DXA ordered 12/25/2023, patient asked to schedule           DIAGNOSES/ORDERS addressed on 07/24/2023:  1. Bilateral malignant neoplasm of breast in female, estrogen receptor positive, unspecified site of breast (HCC/RAF)        - on exemestane  - continue close monitoring for toxicity of intensive drug therapy.      NEW PROBLEMS AS OF 07/24/2023:     []  New minor or self-limited problem (99202/99212).  []  2+ minor or self-limited problem, stable chronic illness, acute uncomplicated illness/injury (99203/99213).  []  Chronic illness with exacerbation/progression/tx side effect; 2+ stable chronic illnesses, new problem with uncertain prognosis, acute illness with systemic  symptoms, or acute complicated injury (99204/99214).  []  Chronic illness with severe exacerbation/progression/tx side effect, acute/chronic illness or injury that poses threat to life or bodily function (99205/99215).      REVIEW OF DATA   I have  [x]  Reviewed/ordered []  1 []  2 [x]  >= 3 unique laboratory, radiology, and/or diagnostic tests noted below  No orders of the defined types were placed in this encounter.      [x]  Reviewed []  1 []  2 [x]  >= 3 prior external notes and incorporated into patient assessment   I reviewed Dr. CARLIE K. THOMPSON, MD in Surgery, Surgical Oncology on 03/07/2022.  I reviewed Dr. Volney Grumbles, MD in Medicine, Internal Medicine on 03/16/2023.  I reviewed Dr. Not Found in Not Found on Not Found.    []  Discussed management or test interpretation with external provider(s) on 07/24/2023 as noted:    RISK OF COMPLICATION   This writer has deemed the above diagnoses to have a risk of complication, morbidity or mortality of:   []  Minimal  []  Low  [x]  Moderate   []  due to prescription drug management   [] Decision re: minor surgery   [] Diagnosis or treatment limited by social determinants of health  []  Severe    []  due to intensive monitoring for chemotherapy toxicity   []  Decision elective surgery with risk factors  []  Decision regarding need for hospitalization  []  Advanced Care Planning decisions    SOCIAL DETERMINANTS OF HEALTH   []  The diagnosis or treatment of said conditions is significantly limited by social determinants of health:      I spent 30 minutes counseling and coordinating care for the items checked below on the day of service 07/24/2023  [x]  Preparing to see the patient (e.g., review of tests)  [x]  Obtaining and or reviewing separately obtained history   [x]  Performing a medically appropriate examination and/or evaluation   [x]  Counseling and educating the patient/family/caregiver   [x]  Ordering medications, tests, or procedures  []  Referring and communicating with other healthcare professionals (when not separately reported)  [x]  Documenting clinical information in the EHR  []  Independently interpreting results and communicating results to patient/ family/caregiver     No follow-ups on file.       The above recommendation were discussed with the patient. The patient has all questions answered satisfactorily and is in agreement with this recommended plan of care.    Author: Jennet Mode, NP

## 2023-07-25 ENCOUNTER — Ambulatory Visit: Payer: BLUE CROSS/BLUE SHIELD | Attending: Geriatric Medicine

## 2023-07-25 ENCOUNTER — Ambulatory Visit: Payer: PRIVATE HEALTH INSURANCE

## 2023-07-25 DIAGNOSIS — E785 Hyperlipidemia, unspecified: Secondary | ICD-10-CM

## 2023-07-25 DIAGNOSIS — I700 Atherosclerosis of aorta: Secondary | ICD-10-CM

## 2023-07-25 DIAGNOSIS — I5022 Chronic systolic (congestive) heart failure: Secondary | ICD-10-CM

## 2023-07-25 DIAGNOSIS — I10 Essential (primary) hypertension: Secondary | ICD-10-CM

## 2023-07-25 DIAGNOSIS — E875 Hyperkalemia: Secondary | ICD-10-CM
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
## 2023-07-25 NOTE — Consults
 CARDIOLOGY NOTE      PATIENT: [name]   MRN: [id number]   DOB: May 21, 1935   DATE OF SERVICE: 07/25/2023    PCP:  Volney Grumbles., MD      Problem List Items Addressed This Visit       Hyperkalemia     Other Visit Diagnoses         Systolic CHF, chronic (HCC/RAF)    -  Primary      Atherosclerosis of aorta          Dyslipidemia          Essential hypertension                    Interval History:     07/25/2023:   A/w daughter  Recent hospitalization for dyspnea.   She was started on Lasix. She was taking up to 3 tabs per day  She last took Lasix prior to angiogram  Uses a walker   Pt notes chest tightness and dyspnea    08/2022:  The patient feels SOB/dyspnea and tired  She notes chest pain and fibromyalgia   Ambulates with a walker  ET: 1/2 block, stable     Prior Encounter:     This is a 88 y.o. female who is here for cardiac evaluation.  Indication:   SOB    The patient is a/w friend, Enzo Has    Pt was recently hospitalized with SOB and chest pain.   Pt ambulates with a walker for precautionary reasons given history of falls.   With activity, the patient reports SOB/dypnea. She walks the dog 1/2 block  Her symptoms have been ongoing for the past 3 years, progressive worsening.   The patient notes allergies and asthma.   About 1 year ago, she could walk 1.5 blocks         Past Medical History:   Diagnosis Date    Asthma     Breast cancer (HCC/RAF) 12/20/2018    CHF (congestive heart failure) (HCC/RAF)     COPD (chronic obstructive pulmonary disease) (HCC/RAF)     Diabetes mellitus (HCC/RAF)     Renal disorder        No past surgical history on file.    Allergies:   Allergies   Allergen Reactions    Beta Adrenergic Blockers Other (See Comments)     Avoid d/t Myasthenia Gravis  Avoid d/t Myasthenia Gravis      Levofloxacin Other (See Comments)     Flare of Myasthenia Gravis    Macrolides And Ketolides Other (See Comments)     Avoid d/t Myasthenia Gravis  Use with caution d/t Myasthenia Gravis  Use with caution d/t Myasthenia Gravis      Sulfa Antibiotics Other (See Comments)     May possibly have caused deafness in one ear  May possibly have caused deafness in one ear  other      Botulinum Toxins Other (See Comments)     Muscle weakness r/t Myasthenia Gravis  Muscle weakness r/t Myasthenia Gravis      Pollen Extract Wheezing    Statins Other (See Comments)     Muscle aches  Muscle aches  Muscle aches      Plastic Tape Itching and Other (See Comments)     Turns the skin red where applied       Social History     Socioeconomic History    Marital status: Widowed   Tobacco Use  Smoking status: Former     Current packs/day: 0.00     Types: Cigarettes     Quit date: 1985     Years since quitting: 40.4    Smokeless tobacco: Former   Substance and Sexual Activity    Alcohol use: Never    Drug use: Never    Sexual activity: Not Currently     Social Drivers of Health     Financial Resource Strain: Low Risk  (06/12/2023)    Financial Resource Strain     Difficulty of Paying Living Expenses: Not hard at all       No family history on file.  Adopted     Objective:     BP 102/63  ~ Pulse 98  ~ Wt 139 lb 12.8 oz (63.4 kg)  ~ SpO2 93%  ~ BMI 23.26 kg/m?     Vitals:    07/25/23 1308   BP: 102/63   Pulse: 98   SpO2: 93%   Weight: 139 lb 12.8 oz (63.4 kg)       BP Readings from Last 3 Encounters:   07/25/23 102/63   07/24/23 123/72   07/18/23 111/47       Body mass index is 23.26 kg/m?Aaron Aas    Wt Readings from Last 3 Encounters:   07/25/23 139 lb 12.8 oz (63.4 kg)   07/24/23 138 lb 9.6 oz (62.9 kg)   07/18/23 137 lb 12.6 oz (62.5 kg)          General Exam: no apparent distress.    Neurologic/Psychiatric: alert and oriented. Affect is appropriate.  HEENT: Sclerae are anicteric. There is no epistaxis.  Cardiac: regular in rate and rhythm.    Respiratory: non-labored breathing. No use of accessory muscles. No wheeze.  Abdomen: bowel sounds are present. Abdomen is soft. No rebound.    Extremities: no edema. No cyanosis or clubbing.    Integument: no petechiae. No acute rash.         Laboratory:  Lab Results   Component Value Date    NA 134 (L) 07/24/2023    NA 137 07/18/2023    K 5.18 (H) 07/24/2023    K 4.1 07/18/2023    CL 101.5 07/24/2023    CL 103 07/18/2023    CO2 27.3 07/24/2023    CO2 23 07/18/2023    BUN 28.0 (H) 07/24/2023    BUN 41 (H) 07/18/2023    CREAT 2.2 (H) 07/24/2023    CREAT 1.5 (H) 07/18/2023    CREAT 2.07 (H) 07/18/2023    GLUCOSE 72 07/24/2023    GLUCOSE 115 (H) 07/18/2023    CALCIUM 8.9 07/24/2023    CALCIUM 8.7 07/18/2023     Lab Results   Component Value Date    CKTOT 51 12/11/2022    CKTOT 75 07/05/2022    CKTOT 54 01/07/2022    TROPONIN 13 (H) 06/11/2023    TROPONIN 32 (H) 06/11/2023    TROPONIN 0.05 (H) 12/19/2020    TROPONIN 0.07 (H) 12/19/2020    TROPONIN 0.06 (H) 12/18/2020     Lab Results   Component Value Date    BNP 639 (H) 07/11/2023     Lab Results   Component Value Date    WBC 7.71 07/18/2023    HGB 13.5 07/18/2023    HCT 41.7 07/18/2023    MCV 93.5 07/18/2023    PLT 335 07/18/2023     Lab Results   Component Value Date    CHOL 160 12/11/2022  CHOLHDL 55 12/11/2022    CHOLDLQ 76 12/11/2022    TRIGLY 181 (H) 12/11/2022     Lab Results   Component Value Date    PT 12.5 07/18/2023    INR 0.9 07/18/2023     No components found for: ''HA1C''  Lab Results   Component Value Date    TSH 2.5 04/17/2023       Patient Active Problem List   Diagnosis    Acute medial meniscal tear, left, subsequent encounter    Angioedema    Anxiety    Asthmatic bronchitis, mild persistent, with acute exacerbation    Chronic cholecystitis    Chronic interstitial cystitis    CKD (chronic kidney disease), stage III (HCC/RAF)    COPD mixed type (HCC/RAF)    Major depression, recurrent, chronic (HCC/RAF)    Diabetes mellitus (HCC/RAF)    Esophageal reflux    Eustachian tube dysfunction    Fibromyalgia    H/O arthroscopy    Insomnia    Myasthenia gravis with exacerbation (HCC/RAF)    Nocturnal hypoxemia    Osteoarthritis of knee    Renal cyst    Seasonal and perennial allergic rhinitis    Spondylosis without myelopathy or radiculopathy, lumbar region    Urethral caruncle    Vaginal atrophy    Benzodiazepine dependence (HCC/RAF)    Breast cancer (HCC/RAF)    Refractive error    Ptosis of eyelid    Pseudophakia of both eyes    Aortic calcification    HTN (hypertension)    Immunosuppressed status    Acute chest pain    Atypical chest pain    Heart failure with reduced ejection fraction LVEF <=40% (HCC/RAF)    Epiretinal membrane, bilateral    PVD (posterior vitreous detachment), bilateral    Chorioretinal scars, bilateral    Hyperkalemia       Outpatient Medications Prior to Visit   Medication Sig    Albuterol Sulfate AEPB Takes 1 to 2 puffs every 4 hours as needed for wheezing or SOB Inhale Takes 1 to 2 puffs every 4 hours as needed for wheezing or SOB .    azaTHIOprine 50 mg tablet Take one and one-half tablet (75 mg total) by mouth two (2) times daily.    clonazePAM 1 mg tablet TAKE 1.5 TABLET BY MOUTH AT NIGHT    dapagliflozin (FARXIGA) 10 mg tablet Take 1 tablet (10 mg total) by mouth daily    DEXILANT 60 MG DR capsule Take 1 capsule (60 mg total) by mouth daily.    DULOXETINE 20 mg DR capsule TAKE 3 CAPSULES (60 MG TOTAL) BY MOUTH DAILY    EPINEPHrine 0.3 mg/0.3 mL auto-injector Inject 0.3 mLs (0.3 mg total) into the muscle as needed for for Anaphylaxis.    Evolocumab (REPATHA SURECLICK) 140 MG/ML SOAJ Inject 1 pen. under the skin every fourteen (14) days.    fluticasone-vilanterol (BREO ELLIPTA) 100-25 mcg/inh inhaler Inhale 1 puff daily.    losartan 100 mg tablet Take 1 tablet (100 mg total) by mouth daily.    montelukast 10 mg tablet Take 1 tablet (10 mg total) by mouth every evening.    penicillin V potassium 500 mg tablet Take 1 tablet (500 mg total) by mouth two (2) times daily.    pyRIDostigmine 60 mg tablet Take 1 tablet (60 mg total) by mouth three (3) times daily.    sodium chloride 0.9% IV soln     tiotropium 18 mcg/act inhalation capsule Place 1 capsule (18 mcg  total) into inhaler and inhale daily.    ULTOMIRIS 1100 MG/11ML SOLN     WELLBUTRIN SR 200 MG 12 hr tablet TAKE 1 TABLET (200 MG TOTAL) BY MOUTH TWO (2) TIMES DAILY    aspirin 81 mg EC tablet Take 1 tablet (81 mg total) by mouth daily.    spironolactone 25 mg tablet Take 1 tablet (25 mg total) by mouth daily.    exemestane 25 mg tablet Take 1 tablet (25 mg total) by mouth daily.    furosemide 20 mg tablet Take 1 tablet (20 mg total) by mouth daily. (Patient not taking: Reported on 07/25/2023)    ULTOMIRIS 300 MG/3ML injection      No facility-administered medications prior to visit.       Current Outpatient Medications   Medication Sig    Albuterol Sulfate AEPB Takes 1 to 2 puffs every 4 hours as needed for wheezing or SOB Inhale Takes 1 to 2 puffs every 4 hours as needed for wheezing or SOB .    azaTHIOprine 50 mg tablet Take one and one-half tablet (75 mg total) by mouth two (2) times daily.    clonazePAM 1 mg tablet TAKE 1.5 TABLET BY MOUTH AT NIGHT    dapagliflozin (FARXIGA) 10 mg tablet Take 1 tablet (10 mg total) by mouth daily    DEXILANT 60 MG DR capsule Take 1 capsule (60 mg total) by mouth daily.    DULOXETINE 20 mg DR capsule TAKE 3 CAPSULES (60 MG TOTAL) BY MOUTH DAILY    EPINEPHrine 0.3 mg/0.3 mL auto-injector Inject 0.3 mLs (0.3 mg total) into the muscle as needed for for Anaphylaxis.    Evolocumab (REPATHA SURECLICK) 140 MG/ML SOAJ Inject 1 pen. under the skin every fourteen (14) days.    fluticasone-vilanterol (BREO ELLIPTA) 100-25 mcg/inh inhaler Inhale 1 puff daily.    losartan 100 mg tablet Take 1 tablet (100 mg total) by mouth daily.    montelukast 10 mg tablet Take 1 tablet (10 mg total) by mouth every evening.    penicillin V potassium 500 mg tablet Take 1 tablet (500 mg total) by mouth two (2) times daily.    pyRIDostigmine 60 mg tablet Take 1 tablet (60 mg total) by mouth three (3) times daily.    sodium chloride 0.9% IV soln     tiotropium 18 mcg/act inhalation capsule Place 1 capsule (18 mcg total) into inhaler and inhale daily.    ULTOMIRIS 1100 MG/11ML SOLN     WELLBUTRIN SR 200 MG 12 hr tablet TAKE 1 TABLET (200 MG TOTAL) BY MOUTH TWO (2) TIMES DAILY    [DISCONTINUED] aspirin 81 mg EC tablet Take 1 tablet (81 mg total) by mouth daily.    [DISCONTINUED] spironolactone 25 mg tablet Take 1 tablet (25 mg total) by mouth daily.    exemestane 25 mg tablet Take 1 tablet (25 mg total) by mouth daily.    [Paused] furosemide 20 mg tablet Take 1 tablet (20 mg total) by mouth daily. (Patient not taking: Reported on 07/25/2023)    ULTOMIRIS 300 MG/3ML injection      No current facility-administered medications for this visit.     Facility-Administered Medications Ordered in Other Visits   Medication Dose Route Frequency    [COMPLETED] iodixanol (Visipaque) 320 mg/mL inj 25 mL  25 mL Intra-arterial Once    [EXPIRED] sodium chloride 0.9% IV soln  100 mL/hr Intravenous Continuous    [DISCONTINUED] acetaminophen tab 650 mg  650 mg Oral Q6H PRN    [  DISCONTINUED] fentaNYL 0.05 mg/mL inj    PRN    [DISCONTINUED] heparin 1,000 units/mL Bulk Charge    PRN    [DISCONTINUED] lidocaine PF 1% inj    PRN    [DISCONTINUED] midazolam (PF) 1 mg/mL inj    PRN    [DISCONTINUED] nitroglycerin 100 mcg/mL 10mL syringe    PRN             ACC/AHA Cholesterol Management Guideline Recommendations:   To continue statin treatment, as indicated based on pre-treatment risk assessment.      ASCVD Risk Score    10-year ASCVD risk  N/A. Age > 75. as of 1:37 PM on 07/25/2023.   10-year ASCVD risk with optimal risk factors  cannot be calculated.   Values used to calculate ASCVD Risk Score    Age  37 y.o. Cannot calculate risk because age is not between 61 and 75 years old.   Gender  female   Race  White   HDL Cholesterol  55 mg/dL. (measured on 12/11/2022)   LDL Cholesterol  76 mg/dL. (measured on 12/11/2022)   Total Cholesterol  160 mg/dL. (measured on 12/11/2022)   Systolic Blood Pressure  102 mm Hg. (measured on 07/25/2023) Blood Pressure Medication Present  Yes   Smoking Status  currently not a smoker   Diabetes Present  Yes     Click here for the [facility] ASCVD Cardiovascular Risk Estimator Plus tool Office manager).           Assessment:     ALANTIS BETHUNE is 88 y.o. female with    Non-ischemic Cardiomyopathy, EF 35-40% Echo in 2023, down to 22% Echo 05/2023  (G2211 - responsible for the longitudinal management of complex or chronic conditions that require ongoing care and coordination).      - avoiding BBlockers due to myasthenia gravis  - cardiac cath with no CAD in May 2025  - discussed screening of 1st degree relatives with pt and daughter. Pt is adopted. 2 kids    Abnormal Myoview in 2023: There is a moderate-severe perfusion defect in the distal anteroseptal wall and apex, suggestive for MI.    SOB/dyspnea. Uses oxygen concentrator  Abnormal PFTs, h/o COPD   Abnormal EKG with LBBB and PACs  Former tobacco use  Subtle calcifications of the aortic root. Moderately calcified thoracic aorta and its main branch vessels.  Intolerant to Statins  Frail and deconditioned     Plan:     Losartan 100 mg  OK to DC ASA    Repatha - discussed compliance   Farxiga  DC Aldactone given high K and AKI  Refer to EP      Orders Placed This Encounter    eGFR by Creatinine    Potassium    Referral to Electrophysiology    ECG 12-Lead Clinic Performed           Return in about 2 weeks (around 08/08/2023). With labs      GDMT for Heart Failure with Reduced Ejection Fraction   Most recent LVEF: 22%, HFrEF present on problem list: Yes.   For the most accurate recommendations, please ensure that a LVEF is present and HFrEF is added to the problem list, if applicable      GDMT Medication Class Current Prescription Recommendation   Specified Beta Blockers^ No HFrEF beta blocker found Patient has a listed allergy to beta blockers   ACEi, ARB, ARNi^^ Losartan Patient is on ARNI (sacubitril-valsartan). Use with caution in patients with hyperkalemia  and renail impairment.   MRA No MRA found Patient has a current contraindication or exclusion for MRA. Use with caution in patients with hyperkalemia and renal impairment   SGLT2i Dapagliflozin Patient is on SGLT2i. Use with caution in patients with renal impairment.   ^ Specified beta blockers: metoprolol succinate, carvedilol, bisoprolol   Aaron Aas ARNI (sacubitril/valsartan) preferred. Criteria for ARNI initiation: Discontinuation of ACEI for at least 36 hours, eGFR>=30, K<=5.2, no history of angioedema   Click here for ACC / AHA HFrEF GDMT recommendations         There are no Patient Instructions on file for this visit.    Future Appointments   Date Time Provider Department Center   07/25/2023  3:40 PM Volney Grumbles., MD GERI I[facility] [address]/Cen   09/19/2023 10:30 AM Jaqueline Mering, MD [facility] [address]/Cen   10/23/2023 10:00 AM Fairy Homer., MD HEM/ONC [address]         Author: Gable Odonohue, MD

## 2023-07-26 ENCOUNTER — Telehealth: Payer: Commercial Managed Care - Pharmacy Benefit Manager

## 2023-07-26 MED ORDER — DULOXETINE HCL 20 MG PO CPEP
60 mg | ORAL_CAPSULE | Freq: Every day | ORAL | 3 refills | 30.00000 days | Status: AC
Start: 2023-07-26 — End: ?

## 2023-07-26 NOTE — Telephone Encounter
 Call Back Request      Reason for call back: Gregary Lean, RN with [facility] is requesting a call back to get clarification on several of the patients medications. Please call [phone]  Urgent request    Any Symptoms:  []  Yes  [x]  No      If yes, what symptoms are you experiencing:    Duration of symptoms (how long):    Have you taken medication for symptoms (OTC or Rx):      If call was taken outside of clinic hours:    [] Patient or caller has been notified that this message was sent outside of normal clinic hours.     [] Patient or caller has been warm transferred to the physician's answering service. If applicable, patient or caller informed to please call us  back if symptoms progress.  Patient or caller has been notified of the turnaround time of 1-2 business day(s). Yes    Thank You

## 2023-08-01 ENCOUNTER — Ambulatory Visit: Payer: Commercial Managed Care - Pharmacy Benefit Manager

## 2023-08-01 ENCOUNTER — Ambulatory Visit: Payer: Commercial Managed Care - Pharmacy Benefit Manager | Attending: Cardiovascular Disease

## 2023-08-05 ENCOUNTER — Other Ambulatory Visit: Payer: MEDICARE

## 2023-08-07 ENCOUNTER — Telehealth: Payer: BLUE CROSS/BLUE SHIELD

## 2023-08-07 NOTE — Telephone Encounter
 Call Back Request      Reason for call back:  Per Marissa from the [facility], is requesting for Dr. Jaun Messick to have all future new rx and refills to be sent to the pharmacy of -    [facility]  - [place], [place] - [address]    Caller requesting for a call back to confirm.  Thanks.    Call back info:  Adan Adas   ( the watermark)  [phone]      Any Symptoms:  []  Yes  [x]  No      If yes, what symptoms are you experiencing:    Duration of symptoms (how long):    Have you taken medication for symptoms (OTC or Rx):      If call was taken outside of clinic hours:    [] Patient or caller has been notified that this message was sent outside of normal clinic hours.     [] Patient or caller has been warm transferred to the physician's answering service. If applicable, patient or caller informed to please call us  back if symptoms progress.  Patient or caller has been notified of the turnaround time of 1-2 business day(s).

## 2023-08-08 ENCOUNTER — Ambulatory Visit: Payer: BLUE CROSS/BLUE SHIELD

## 2023-08-08 DIAGNOSIS — I5022 Chronic systolic (congestive) heart failure: Secondary | ICD-10-CM

## 2023-08-08 DIAGNOSIS — I10 Essential (primary) hypertension: Secondary | ICD-10-CM

## 2023-08-08 DIAGNOSIS — E785 Hyperlipidemia, unspecified: Secondary | ICD-10-CM

## 2023-08-08 DIAGNOSIS — I700 Atherosclerosis of aorta: Secondary | ICD-10-CM

## 2023-08-08 NOTE — Consults
 CARDIOLOGY NOTE      PATIENT: [name]   MRN: [id number]   DOB: 1935-05-02   DATE OF SERVICE: 08/08/2023    PCP:  [name], MD      Problem List Items Addressed This Visit    None  Visit Diagnoses         Systolic CHF, chronic (HCC/RAF)    -  Primary      Essential hypertension          Dyslipidemia          Atherosclerosis of aorta                    Interval History:     08/08/2023:   Pt with dyspnea and chest pain with exertion  She is taking Lasix on daily basis    07/25/2023:  A/w daughter  Recent hospitalization for dyspnea.   She was started on Lasix. She was taking up to 3 tabs per day  She last took Lasix prior to angiogram  Uses a walker   Pt notes chest tightness and dyspnea    08/2022:  The patient feels SOB/dyspnea and tired  She notes chest pain and fibromyalgia   Ambulates with a walker  ET: 1/2 block, stable     Prior Encounter:     This is a 88 y.o. female who is here for cardiac evaluation.  Indication:   SOB    The patient is a/w friend, Winston    Pt was recently hospitalized with SOB and chest pain.   Pt ambulates with a walker for precautionary reasons given history of falls.   With activity, the patient reports SOB/dypnea. She walks the dog 1/2 block  Her symptoms have been ongoing for the past 3 years, progressive worsening.   The patient notes allergies and asthma.   About 1 year ago, she could walk 1.5 blocks         Past Medical History:   Diagnosis Date    Asthma     Breast cancer (HCC/RAF) 12/20/2018    CHF (congestive heart failure) (HCC/RAF)     COPD (chronic obstructive pulmonary disease) (HCC/RAF)     Diabetes mellitus (HCC/RAF)     Renal disorder        No past surgical history on file.    Allergies:   Allergies   Allergen Reactions    Beta Adrenergic Blockers Other (See Comments)     Avoid d/t Myasthenia Gravis  Avoid d/t Myasthenia Gravis      Levofloxacin Other (See Comments)     Flare of Myasthenia Gravis    Macrolides And Ketolides Other (See Comments)     Avoid d/t Myasthenia Gravis  Use with caution d/t Myasthenia Gravis  Use with caution d/t Myasthenia Gravis      Sulfa Antibiotics Other (See Comments)     May possibly have caused deafness in one ear  May possibly have caused deafness in one ear  other      Botulinum Toxins Other (See Comments)     Muscle weakness r/t Myasthenia Gravis  Muscle weakness r/t Myasthenia Gravis      Pollen Extract Wheezing    Statins Other (See Comments)     Muscle aches  Muscle aches  Muscle aches      Plastic Tape Itching and Other (See Comments)     Turns the skin red where applied       Social History     Socioeconomic  History    Marital status: Widowed   Tobacco Use    Smoking status: Former     Current packs/day: 0.00     Types: Cigarettes     Quit date: 1985     Years since quitting: 40.4    Smokeless tobacco: Former   Substance and Sexual Activity    Alcohol use: Never    Drug use: Never    Sexual activity: Not Currently     Social Drivers of Health     Financial Resource Strain: Low Risk  (06/12/2023)    Financial Resource Strain     Difficulty of Paying Living Expenses: Not hard at all       No family history on file.  Adopted     Objective:     BP 124/72  ~ Pulse (!) 109  ~ Ht 5' 5'' (1.651 m)  ~ Wt 143 lb (64.9 kg)  ~ SpO2 93%  ~ BMI 23.80 kg/m?     Vitals:    08/08/23 1141   BP: 124/72   Pulse: (!) 109   SpO2: 93%   Weight: 143 lb (64.9 kg)   Height: 5' 5'' (1.651 m)       BP Readings from Last 3 Encounters:   08/08/23 124/72   07/25/23 118/73   07/25/23 102/63       Body mass index is 23.8 kg/m?SABRA    Wt Readings from Last 3 Encounters:   08/08/23 143 lb (64.9 kg)   07/25/23 139 lb 4.8 oz (63.2 kg)   07/25/23 139 lb 12.8 oz (63.4 kg)          General Exam: no apparent distress.    Neurologic/Psychiatric: alert and oriented. Affect is appropriate.  HEENT: Sclerae are anicteric. There is no epistaxis.  Cardiac: regular in rate and rhythm.    Respiratory: non-labored breathing. No use of accessory muscles. No wheeze.  Abdomen: bowel sounds are present. Abdomen is soft. No rebound.    Extremities: no edema. No cyanosis or clubbing.    Integument: no petechiae. No acute rash.         Laboratory:  Lab Results   Component Value Date    NA 138 08/01/2023    K 4.7 08/01/2023    CL 101 08/01/2023    CO2 22 08/01/2023    BUN 27 (H) 08/01/2023    CREAT 2.03 (H) 08/01/2023    GLUCOSE 96 08/01/2023    CALCIUM 9.5 08/01/2023     Lab Results   Component Value Date    CKTOT 51 12/11/2022    CKTOT 75 07/05/2022    CKTOT 54 01/07/2022    TROPONIN 13 (H) 06/11/2023    TROPONIN 32 (H) 06/11/2023    TROPONIN 0.05 (H) 12/19/2020    TROPONIN 0.07 (H) 12/19/2020    TROPONIN 0.06 (H) 12/18/2020     Lab Results   Component Value Date    BNP 648 (H) 08/01/2023     Lab Results   Component Value Date    WBC 7.71 07/18/2023    HGB 13.5 07/18/2023    HCT 41.7 07/18/2023    MCV 93.5 07/18/2023    PLT 335 07/18/2023     Lab Results   Component Value Date    CHOL 160 12/11/2022    CHOLHDL 55 12/11/2022    CHOLDLQ 76 12/11/2022    TRIGLY 181 (H) 12/11/2022     Lab Results   Component Value Date    PT 12.5 07/18/2023  INR 0.9 07/18/2023     No components found for: ''HA1C''  Lab Results   Component Value Date    TSH 2.5 04/17/2023       Patient Active Problem List   Diagnosis    Acute medial meniscal tear, left, subsequent encounter    Angioedema    Anxiety    Asthmatic bronchitis, mild persistent, with acute exacerbation    Chronic cholecystitis    Chronic interstitial cystitis    CKD (chronic kidney disease), stage III (HCC/RAF)    COPD mixed type (HCC/RAF)    Major depression, recurrent, chronic (HCC/RAF)    Diabetes mellitus (HCC/RAF)    Esophageal reflux    Eustachian tube dysfunction    Fibromyalgia    H/O arthroscopy    Insomnia    Myasthenia gravis with exacerbation (HCC/RAF)    Nocturnal hypoxemia    Osteoarthritis of knee    Renal cyst    Seasonal and perennial allergic rhinitis    Spondylosis without myelopathy or radiculopathy, lumbar region    Urethral caruncle    Vaginal atrophy    Benzodiazepine dependence (HCC/RAF)    Breast cancer (HCC/RAF)    Refractive error    Ptosis of eyelid    Pseudophakia of both eyes    Aortic calcification    HTN (hypertension)    Immunosuppressed status    Acute chest pain    Atypical chest pain    Heart failure with reduced ejection fraction LVEF <=40% (HCC/RAF)    Epiretinal membrane, bilateral    PVD (posterior vitreous detachment), bilateral    Chorioretinal scars, bilateral    Hyperkalemia       Outpatient Medications Prior to Visit   Medication Sig    Albuterol Sulfate AEPB Takes 1 to 2 puffs every 4 hours as needed for wheezing or SOB Inhale Takes 1 to 2 puffs every 4 hours as needed for wheezing or SOB .    azaTHIOprine 50 mg tablet Take one and one-half tablet (75 mg total) by mouth two (2) times daily.    clonazePAM 1 mg tablet TAKE 1.5 TABLET BY MOUTH AT NIGHT    dapagliflozin (FARXIGA) 10 mg tablet Take 1 tablet (10 mg total) by mouth daily    DEXILANT 60 MG DR capsule Take 1 capsule (60 mg total) by mouth daily.    DULOXETINE 20 mg DR capsule TAKE 3 CAPSULES (60 MG TOTAL) BY MOUTH DAILY    EPINEPHrine 0.3 mg/0.3 mL auto-injector Inject 0.3 mLs (0.3 mg total) into the muscle as needed for for Anaphylaxis.    Evolocumab (REPATHA SURECLICK) 140 MG/ML SOAJ Inject 1 pen. under the skin every fourteen (14) days.    exemestane 25 mg tablet Take 1 tablet (25 mg total) by mouth daily.    fluticasone-vilanterol (BREO ELLIPTA) 100-25 mcg/inh inhaler Inhale 1 puff daily.    furosemide 20 mg tablet Take 1 tablet (20 mg total) by mouth daily.    losartan 100 mg tablet Take 1 tablet (100 mg total) by mouth daily.    montelukast 10 mg tablet Take 1 tablet (10 mg total) by mouth every evening.    penicillin V potassium 500 mg tablet Take 1 tablet (500 mg total) by mouth two (2) times daily.    pyRIDostigmine 60 mg tablet Take 1 tablet (60 mg total) by mouth three (3) times daily.    sodium chloride 0.9% IV soln     tiotropium 18 mcg/act inhalation capsule Place 1 capsule (18 mcg total) into inhaler and  inhale daily.    ULTOMIRIS 300 MG/3ML injection     WELLBUTRIN SR 200 MG 12 hr tablet TAKE 1 TABLET (200 MG TOTAL) BY MOUTH TWO (2) TIMES DAILY    ULTOMIRIS 1100 MG/11ML SOLN      No facility-administered medications prior to visit.       Current Outpatient Medications   Medication Sig    Albuterol Sulfate AEPB Takes 1 to 2 puffs every 4 hours as needed for wheezing or SOB Inhale Takes 1 to 2 puffs every 4 hours as needed for wheezing or SOB .    azaTHIOprine 50 mg tablet Take one and one-half tablet (75 mg total) by mouth two (2) times daily.    clonazePAM 1 mg tablet TAKE 1.5 TABLET BY MOUTH AT NIGHT    dapagliflozin (FARXIGA) 10 mg tablet Take 1 tablet (10 mg total) by mouth daily    DEXILANT 60 MG DR capsule Take 1 capsule (60 mg total) by mouth daily.    DULOXETINE 20 mg DR capsule TAKE 3 CAPSULES (60 MG TOTAL) BY MOUTH DAILY    EPINEPHrine 0.3 mg/0.3 mL auto-injector Inject 0.3 mLs (0.3 mg total) into the muscle as needed for for Anaphylaxis.    Evolocumab (REPATHA SURECLICK) 140 MG/ML SOAJ Inject 1 pen. under the skin every fourteen (14) days.    exemestane 25 mg tablet Take 1 tablet (25 mg total) by mouth daily.    fluticasone-vilanterol (BREO ELLIPTA) 100-25 mcg/inh inhaler Inhale 1 puff daily.    furosemide 20 mg tablet Take 1 tablet (20 mg total) by mouth daily.    losartan 100 mg tablet Take 1 tablet (100 mg total) by mouth daily.    montelukast 10 mg tablet Take 1 tablet (10 mg total) by mouth every evening.    penicillin V potassium 500 mg tablet Take 1 tablet (500 mg total) by mouth two (2) times daily.    pyRIDostigmine 60 mg tablet Take 1 tablet (60 mg total) by mouth three (3) times daily.    sodium chloride 0.9% IV soln     tiotropium 18 mcg/act inhalation capsule Place 1 capsule (18 mcg total) into inhaler and inhale daily.    ULTOMIRIS 300 MG/3ML injection     WELLBUTRIN SR 200 MG 12 hr tablet TAKE 1 TABLET (200 MG TOTAL) BY MOUTH TWO (2) TIMES DAILY    ULTOMIRIS 1100 MG/11ML SOLN      No current facility-administered medications for this visit.             ACC/AHA Cholesterol Management Guideline Recommendations:   To continue statin treatment, as indicated based on pre-treatment risk assessment.      ASCVD Risk Score    10-year ASCVD risk  N/A. Age > 75. as of 12:05 PM on 08/08/2023.   10-year ASCVD risk with optimal risk factors  cannot be calculated.   Values used to calculate ASCVD Risk Score    Age  4 y.o. Cannot calculate risk because age is not between 68 and 77 years old.   Gender  female   Race  White   HDL Cholesterol  55 mg/dL. (measured on 12/11/2022)   LDL Cholesterol  76 mg/dL. (measured on 12/11/2022)   Total Cholesterol  160 mg/dL. (measured on 12/11/2022)   Systolic Blood Pressure  124 mm Hg. (measured on 08/08/2023)   Blood Pressure Medication Present  Yes   Smoking Status  currently not a smoker   Diabetes Present  Yes     Click here for  the [facility] ASCVD Cardiovascular Risk Estimator Plus tool Office manager).           Assessment:     [name] is 88 y.o. female with    Non-ischemic Cardiomyopathy, EF 35-40% Echo in 2023, down to 22% Echo 05/2023  (G2211 - responsible for the longitudinal management of complex or chronic conditions that require ongoing care and coordination).      - avoiding BBlockers due to myasthenia gravis  - cardiac cath with no CAD in May 2025  - discussed screening of 1st degree relatives with pt and daughter. Pt is adopted. 2 kids    Abnormal Myoview in 2023: There is a moderate-severe perfusion defect in the distal anteroseptal wall and apex, suggestive for MI.    SOB/dyspnea. Uses oxygen concentrator  Abnormal PFTs, h/o COPD   Abnormal EKG with LBBB and PACs  Former tobacco use  Subtle calcifications of the aortic root. Moderately calcified thoracic aorta and its main branch vessels.  Intolerant to Statins  Frail and deconditioned     Plan:     Losartan 100 mg  DC'd ASA    Repatha - discussed compliance Farxiga  DC'd Aldactone given high K and AKI  Refer to EP if amenable. The patient is reluctant to have cardiac procedures      No orders of the defined types were placed in this encounter.          Return in about 4 weeks (around 09/05/2023).        GDMT for Heart Failure with Reduced Ejection Fraction   Most recent LVEF: 22%, HFrEF present on problem list: Yes.   For the most accurate recommendations, please ensure that a LVEF is present and HFrEF is added to the problem list, if applicable      GDMT Medication Class Current Prescription Recommendation   Specified Beta Blockers^ No HFrEF beta blocker found Patient has a listed allergy to beta blockers   ACEi, ARB, ARNi^^ Losartan Patient is on ARNI (sacubitril-valsartan). Use with caution in patients with hyperkalemia and renail impairment.   MRA No MRA found Patient has a current contraindication or exclusion for MRA. Use with caution in patients with hyperkalemia and renal impairment   SGLT2i Dapagliflozin Patient is on SGLT2i. Use with caution in patients with renal impairment.   ^ Specified beta blockers: metoprolol succinate, carvedilol, bisoprolol   EVELENE ARNI (sacubitril/valsartan) preferred. Criteria for ARNI initiation: Discontinuation of ACEI for at least 36 hours, eGFR>=30, K<=5.2, no history of angioedema   Click here for ACC / AHA HFrEF GDMT recommendations         There are no Patient Instructions on file for this visit.    Future Appointments   Date Time Provider Department Center   09/05/2023  1:00 PM Leavy Gruber, MD CRD DIS [address] [facility]   09/19/2023 10:30 AM Daryel Cornet, MD [facility] [address] [place]/Cen   09/19/2023 11:20 AM [name], MD GERI IMS [address] [place]/Cen   10/23/2023 10:00 AM Lestine Ezella HERO., MD HEM/ONC [address]         Author: Tevita Gomer, MD       [facility] 08/12/2023     Daryel Cornet, MD  Inita Uram, MD; [name], MD  Hi,    Sorry for the late reply. I'm okay with started a beta blocker from myasthenia gravis perspective. The risk of exacerbation in this case is very small. Different classes of beta blockers are not studied as far as I  know, so I do not have any preference. Lower dose is likely safer. Please also keep in mind that she has COPD.    Best,  Yigit          Previous Messages       ----- Message -----  From: Bari Leib, MD  Sent: 08/08/2023  11:53 AM PDT  To: Penne LOIS Quaker, MD; Dawayne Fillers, MD    Hi,  Pt with heart failure and reduced LV EF.  Do you have a concern for chronic use of BBlockers? Any preference for Metoprolol or Coreg?    BBlockrs are listed in her Allergies: Avoid d/t Myasthenia Gravis        Thanks,  Rt    Donaciano Carolin, MD

## 2023-08-12 MED ORDER — ASPIRIN 81 MG PO TBEC
81 mg | ORAL_TABLET | Freq: Every day | ORAL | 3 refills | 42.00000 days | Status: AC
Start: 2023-08-12 — End: ?

## 2023-08-12 MED ORDER — ALBUTEROL SULFATE 108 (90 BASE) MCG/ACT IN AEPB
RESPIRATORY_TRACT | 3 refills | 25.00000 days | Status: AC
Start: 2023-08-12 — End: ?

## 2023-08-12 MED ORDER — DAPAGLIFLOZIN PROPANEDIOL 10 MG PO TABS
ORAL_TABLET | 10 refills
Start: 2023-08-12 — End: ?

## 2023-08-12 MED ORDER — DULOXETINE HCL 20 MG PO CPEP
ORAL_CAPSULE | 10 refills
Start: 2023-08-12 — End: ?

## 2023-08-12 MED ORDER — SPIRONOLACTONE 25 MG PO TABS
ORAL_TABLET | 10 refills
Start: 2023-08-12 — End: ?

## 2023-08-12 MED ORDER — FUROSEMIDE 20 MG PO TABS
ORAL_TABLET | 10 refills
Start: 2023-08-12 — End: ?

## 2023-08-12 MED ORDER — EXEMESTANE 25 MG PO TABS
ORAL_TABLET | 10 refills
Start: 2023-08-12 — End: ?

## 2023-08-12 MED ORDER — DEXLANSOPRAZOLE 60 MG PO CPDR
ORAL_CAPSULE | 10 refills
Start: 2023-08-12 — End: ?

## 2023-08-12 MED ORDER — TIOTROPIUM BROMIDE MONOHYDRATE 18 MCG IN CAPS
ORAL_CAPSULE | 10 refills
Start: 2023-08-12 — End: ?

## 2023-08-13 MED ORDER — ASPIRIN LOW DOSE 81 MG PO TBEC
ORAL_TABLET | ORAL | 10 refills | 42.00000 days | Status: AC
Start: 2023-08-13 — End: ?

## 2023-08-13 MED ORDER — PYRIDOSTIGMINE BROMIDE 60 MG PO TABS
ORAL_TABLET | ORAL | 10 refills | 30.00000 days | Status: AC
Start: 2023-08-13 — End: ?

## 2023-08-13 MED ORDER — LOSARTAN POTASSIUM 100 MG PO TABS
ORAL_TABLET | ORAL | 10 refills | 90.00000 days | Status: AC
Start: 2023-08-13 — End: ?

## 2023-08-13 MED ORDER — PENICILLIN V POTASSIUM 500 MG PO TABS
ORAL_TABLET | ORAL | 10 refills | 30.00000 days | Status: AC
Start: 2023-08-13 — End: ?

## 2023-08-13 MED ORDER — ALBUTEROL SULFATE 108 (90 BASE) MCG/ACT IN AEPB
RESPIRATORY_TRACT | 3 refills | 25.00000 days | Status: AC
Start: 2023-08-13 — End: ?

## 2023-08-13 MED ORDER — FLUTICASONE FUROATE-VILANTEROL 100-25 MCG/ACT IN AEPB
RESPIRATORY_TRACT | 10 refills | 60.00000 days | Status: AC
Start: 2023-08-13 — End: ?

## 2023-08-13 MED ORDER — ALBUTEROL SULFATE HFA 108 (90 BASE) MCG/ACT IN AERS
RESPIRATORY_TRACT | 10 refills | 25.00000 days | Status: AC
Start: 2023-08-13 — End: ?

## 2023-08-13 MED ORDER — EPINEPHRINE 0.3 MG/0.3ML IJ SOAJ
INTRAMUSCULAR | 1 refills | 2.00000 days | Status: AC
Start: 2023-08-13 — End: ?

## 2023-08-13 MED ORDER — AZATHIOPRINE 50 MG PO TABS
ORAL_TABLET | ORAL | 10 refills | 30.00000 days | Status: AC
Start: 2023-08-13 — End: ?

## 2023-08-13 MED ORDER — REPATHA SURECLICK 140 MG/ML SC SOAJ
SUBCUTANEOUS | 10 refills | 28.00000 days | Status: AC
Start: 2023-08-13 — End: ?

## 2023-08-13 MED ORDER — MONTELUKAST SODIUM 10 MG PO TABS
ORAL_TABLET | ORAL | 10 refills | 45.00000 days | Status: AC
Start: 2023-08-13 — End: ?

## 2023-08-13 MED ORDER — BUPROPION HCL ER (SR) 200 MG PO TB12
ORAL_TABLET | ORAL | 10 refills | 30.00000 days | Status: AC
Start: 2023-08-13 — End: ?

## 2023-08-14 ENCOUNTER — Telehealth: Payer: BLUE CROSS/BLUE SHIELD

## 2023-08-14 ENCOUNTER — Ambulatory Visit: Payer: BLUE CROSS/BLUE SHIELD | Attending: Geriatric Medicine

## 2023-08-14 ENCOUNTER — Ambulatory Visit: Payer: PRIVATE HEALTH INSURANCE | Attending: Geriatric Medicine

## 2023-08-14 MED ORDER — FUROSEMIDE 20 MG PO TABS
20 mg | ORAL_TABLET | Freq: Every day | ORAL | 3 refills | 30.00000 days | Status: AC
Start: 2023-08-14 — End: ?

## 2023-08-14 NOTE — Telephone Encounter
 Call Back Request      Reason for call back: Booja from [facility] called, they received a denial for the refills that were submitted on 08/12/23 for Spironolactone and furosemide medications. Pt is completely out of all the medications. Please have [name] put in another refill rx for both medications.       Any Symptoms:  []  Yes  [x]  No      If yes, what symptoms are you experiencing:    Duration of symptoms (how long):    Have you taken medication for symptoms (OTC or Rx):      If call was taken outside of clinic hours:    [] Patient or caller has been notified that this message was sent outside of normal clinic hours.     [] Patient or caller has been warm transferred to the physician's answering service. If applicable, patient or caller informed to please call us  back if symptoms progress.  Patient or caller has been notified of the turnaround time of 1-2 business day(s).

## 2023-08-14 NOTE — Telephone Encounter
 PRESCRIPTION REFILL REQUEST    Date last seen: 08/08/23  Next appointment: 09/18/2023  Last refilled, Dispensed, #Refill/s:  06/18/23, furosemide 20 mg #90 tabs. W/ 3 refills  Spironolactone medication was ordered on 06/27/23 by Dr. Ramin Tabibiazar, MD and was discontinued on 07/25/23      Requested Prescriptions     Pending Prescriptions Disp Refills    furosemide 20 mg tablet 90 tablet 3     Sig: Take 1 tablet (20 mg total) by mouth daily.    spironolactone 25 mg tablet 30 tablet 1     Sig: Take 1 tablet (25 mg total) by mouth daily.           Please review, accept and sign, or refuse order. Kindly close encounter when done.    Thank You.

## 2023-08-20 ENCOUNTER — Ambulatory Visit: Payer: BLUE CROSS/BLUE SHIELD | Attending: Geriatric Medicine

## 2023-08-22 ENCOUNTER — Telehealth: Payer: PRIVATE HEALTH INSURANCE

## 2023-08-22 NOTE — Telephone Encounter
 Call Back Request      Reason for call back: Patient is calling in because she had a missed call form the office but not sure who called or what it was regarding. Please call her back.     Any Symptoms:  []  Yes  [x]  No      If yes, what symptoms are you experiencing:    Duration of symptoms (how long):    Have you taken medication for symptoms (OTC or Rx):      If call was taken outside of clinic hours:    [] Patient or caller has been notified that this message was sent outside of normal clinic hours.     [] Patient or caller has been warm transferred to the physician's answering service. If applicable, patient or caller informed to please call us  back if symptoms progress.  Patient or caller has been notified of the turnaround time of 1-2 business day(s).

## 2023-09-02 ENCOUNTER — Telehealth: Payer: Commercial Managed Care - Pharmacy Benefit Manager

## 2023-09-02 NOTE — Telephone Encounter
 Please file PA  She has tried and failed multiple other PPI's including omeprazole 40 mg and pantoprozole 40 mg.  She needs dexilant ,

## 2023-09-03 MED ORDER — DEXILANT 60 MG PO CPDR
60 mg | ORAL_CAPSULE | Freq: Every day | ORAL | 3 refills | 30.00000 days | Status: AC
Start: 2023-09-03 — End: ?

## 2023-09-04 ENCOUNTER — Other Ambulatory Visit: Payer: PRIVATE HEALTH INSURANCE

## 2023-09-04 ENCOUNTER — Telehealth: Payer: BLUE CROSS/BLUE SHIELD

## 2023-09-04 ENCOUNTER — Ambulatory Visit: Payer: BLUE CROSS/BLUE SHIELD

## 2023-09-04 DIAGNOSIS — I5022 Chronic systolic (congestive) heart failure: Principal | ICD-10-CM

## 2023-09-04 DIAGNOSIS — I700 Atherosclerosis of aorta: Secondary | ICD-10-CM

## 2023-09-04 DIAGNOSIS — E785 Hyperlipidemia, unspecified: Secondary | ICD-10-CM

## 2023-09-04 NOTE — Telephone Encounter
 BIN: [id number]  PCN: [id number]  GROUP: [id number]  MEMBER ID: [id number]

## 2023-09-04 NOTE — Telephone Encounter
-----   Message from Curlee JULIANNA Sobers sent at 09/03/2023 10:18 AM PDT -----      ----- Message -----  From: [name], MD  Sent: 09/02/2023   6:51 PM PDT  To: [facility] Pool

## 2023-09-04 NOTE — Consults
 CARDIOLOGY NOTE      PATIENT: [name]   MRN: [id number]   DOB: [birth year]   DATE OF SERVICE: 09/04/2023    PCP:  [name], MD      Problem List Items Addressed This Visit    None  Visit Diagnoses         Systolic CHF, chronic (HCC/RAF)    -  Primary      Atherosclerosis of aorta          Dyslipidemia                      Interval History:     09/04/2023:   Pt with dyspnea and chest pain with exertion  She is taking Lasix  on daily basis        Kaitlyn, Yigit, MD  Kaitlyn Madole, MD; [name], MD  Hi,    Sorry for the late reply. I'm okay with started a beta blocker from myasthenia gravis perspective. The risk of exacerbation in this case is very small. Different classes of beta blockers are not studied as far as I know, so I do not have any preference. Lower dose is likely safer. Please also keep in mind that she has COPD.    Best,  [name]          Previous Messages       ----- Message -----  From: Thomes Burak, MD  Sent: 08/08/2023  11:53 AM PDT  To: Penne LOIS Danetta, MD; Dawayne Fillers, MD    Hi,  Pt with heart failure and reduced LV EF.  Do you have a concern for chronic use of BBlockers? Any preference for Metoprolol or Coreg?    BBlockrs are listed in her Allergies: Avoid d/t Myasthenia Gravis        Thanks,  Rt       07/25/2023:  A/w daughter  Recent hospitalization for dyspnea.   She was started on Lasix . She was taking up to 3 tabs per day  She last took Lasix  prior to angiogram  Uses a walker   Pt notes chest tightness and dyspnea    08/2022:  The patient feels SOB/dyspnea and tired  She notes chest pain and fibromyalgia   Ambulates with a walker  ET: 1/2 block, stable     Prior Encounter:     This is a 88 y.o. female who is here for cardiac evaluation.  Indication:   SOB    The patient is a/w friend, Winston    Pt was recently hospitalized with SOB and chest pain.   Pt ambulates with a walker for precautionary reasons given history of falls.   With activity, the patient reports SOB/dypnea. She walks the dog 1/2 block  Her symptoms have been ongoing for the past 3 years, progressive worsening.   The patient notes allergies and asthma.   About 1 year ago, she could walk 1.5 blocks         Past Medical History:   Diagnosis Date    Asthma     Breast cancer (HCC/RAF) 12/20/2018    CHF (congestive heart failure) (HCC/RAF)     COPD (chronic obstructive pulmonary disease) (HCC/RAF)     Diabetes mellitus (HCC/RAF)     Renal disorder        No past surgical history on file.    Allergies:   Allergies   Allergen Reactions    Beta Adrenergic Blockers Other (See Comments)  Avoid d/t Myasthenia Gravis  Avoid d/t Myasthenia Gravis      Levofloxacin Other (See Comments)     Flare of Myasthenia Gravis    Macrolides And Ketolides Other (See Comments)     Avoid d/t Myasthenia Gravis  Use with caution d/t Myasthenia Gravis  Use with caution d/t Myasthenia Gravis      Sulfa Antibiotics Other (See Comments)     May possibly have caused deafness in one ear  May possibly have caused deafness in one ear  other      Botulinum Toxins Other (See Comments)     Muscle weakness r/t Myasthenia Gravis  Muscle weakness r/t Myasthenia Gravis      Pollen Extract Wheezing    Statins Other (See Comments)     Muscle aches  Muscle aches  Muscle aches      Plastic Tape Itching and Other (See Comments)     Turns the skin red where applied       Social History     Socioeconomic History    Marital status: Widowed   Tobacco Use    Smoking status: Former     Current packs/day: 0.00     Types: Cigarettes     Quit date: 1985     Years since quitting: 40.5    Smokeless tobacco: Former   Substance and Sexual Activity    Alcohol use: Never    Drug use: Never    Sexual activity: Not Currently     Social Drivers of Health     Financial Resource Strain: Low Risk  (06/12/2023)    Financial Resource Strain     Difficulty of Paying Living Expenses: Not hard at all       No family history on file.  Adopted     Objective:     BP 100/61  ~ Pulse (!) 106  ~ Wt 136 lb 9.6 oz (62 kg)  ~ SpO2 94%  ~ BMI 22.73 kg/m?     Vitals:    09/04/23 1453   BP: 100/61   Pulse: (!) 106   SpO2: 94%   Weight: 136 lb 9.6 oz (62 kg)       BP Readings from Last 3 Encounters:   09/04/23 100/61   08/08/23 124/72   07/25/23 118/73       Body mass index is 22.73 kg/m?SABRA    Wt Readings from Last 3 Encounters:   09/04/23 136 lb 9.6 oz (62 kg)   08/08/23 143 lb (64.9 kg)   07/25/23 139 lb 4.8 oz (63.2 kg)          General Exam: no apparent distress.    Neurologic/Psychiatric: alert and oriented. Affect is appropriate.  HEENT: Sclerae are anicteric. There is no epistaxis.  Cardiac: regular in rate and rhythm.    Respiratory: non-labored breathing. No use of accessory muscles. No wheeze.  Abdomen: bowel sounds are present. Abdomen is soft. No rebound.    Extremities: no edema. No cyanosis or clubbing.    Integument: no petechiae. No acute rash.         Laboratory:  Lab Results   Component Value Date    NA 138 08/19/2023    K 4.3 08/19/2023    CL 105 08/19/2023    CO2 20 08/19/2023    BUN 34 (H) 08/19/2023    CREAT 2.25 (H) 08/19/2023    GLUCOSE 133 (H) 08/19/2023    CALCIUM  9.3 08/19/2023     Lab Results   Component  Value Date    CKTOT 51 12/11/2022    CKTOT 75 07/05/2022    CKTOT 54 01/07/2022    TROPONIN 13 (H) 06/11/2023    TROPONIN 32 (H) 06/11/2023    TROPONIN 0.05 (H) 12/19/2020    TROPONIN 0.07 (H) 12/19/2020    TROPONIN 0.06 (H) 12/18/2020     Lab Results   Component Value Date    BNP 355 (H) 08/19/2023     Lab Results   Component Value Date    WBC 7.71 07/18/2023    HGB 13.5 07/18/2023    HCT 41.7 07/18/2023    MCV 93.5 07/18/2023    PLT 335 07/18/2023     Lab Results   Component Value Date    CHOL 160 12/11/2022    CHOLHDL 55 12/11/2022    CHOLDLQ 76 12/11/2022    TRIGLY 181 (H) 12/11/2022     Lab Results   Component Value Date    PT 12.5 07/18/2023    INR 0.9 07/18/2023     No components found for: ''HA1C''  Lab Results   Component Value Date    TSH 2.5 04/17/2023       Patient Active Problem List   Diagnosis    Acute medial meniscal tear, left, subsequent encounter    Angioedema    Anxiety    Asthmatic bronchitis, mild persistent, with acute exacerbation    Chronic cholecystitis    Chronic interstitial cystitis    CKD (chronic kidney disease), stage III (HCC/RAF)    COPD mixed type (HCC/RAF)    Major depression, recurrent, chronic (HCC/RAF)    Diabetes mellitus (HCC/RAF)    Esophageal reflux    Eustachian tube dysfunction    Fibromyalgia    H/O arthroscopy    Insomnia    Myasthenia gravis with exacerbation (HCC/RAF)    Nocturnal hypoxemia    Osteoarthritis of knee    Renal cyst    Seasonal and perennial allergic rhinitis    Spondylosis without myelopathy or radiculopathy, lumbar region    Urethral caruncle    Vaginal atrophy    Benzodiazepine dependence (HCC/RAF)    Breast cancer (HCC/RAF)    Refractive error    Ptosis of eyelid    Pseudophakia of both eyes    Aortic calcification    HTN (hypertension)    Immunosuppressed status    Acute chest pain    Atypical chest pain    Heart failure with reduced ejection fraction LVEF <=40% (HCC/RAF)    Epiretinal membrane, bilateral    PVD (posterior vitreous detachment), bilateral    Chorioretinal scars, bilateral    Hyperkalemia       Outpatient Medications Prior to Visit   Medication Sig    ALBUTEROL  90 mcg/act inhaler INHALE 1-2 PUFF BY MOUTH EVERY 4 HOURS AS NEEDED FOR WHEEZING OR SHORTNESS OF BREATH    Albuterol  Sulfate AEPB Takes 1 to 2 puffs every 4 hours as needed for wheezing or SOB Inhale Takes 1 to 2 puffs every 4 hours as needed for wheezing or SOB .    Albuterol  Sulfate AEPB Takes 1 to 2 puffs every 4 hours as needed for wheezing or SOB Inhale Takes 1 to 2 puffs every 4 hours as needed for wheezing or SOB .    aspirin  81 mg EC tablet Take 1 tablet (81 mg total) by mouth daily.    AZATHIOPRINE  50 mg tablet TAKE 1.5 TABS (75MG ) BY MOUTH TWICE DAILY (PREVENT ORGAN REJECTION)    BUPROPION , SR, 200 mg  12 hr tablet TAKE 1 TAB BY MOUTH TWICE DAILY (ANTIDEPRESSANT)    clonazePAM  1 mg tablet TAKE 1.5 TABLET BY MOUTH AT NIGHT    dapagliflozin  (FARXIGA ) 10 mg tablet Take 1 tablet (10 mg total) by mouth daily    DEXILANT  60 MG DR capsule Take 1 capsule (60 mg total) by mouth daily.    DULOXETINE  20 mg DR capsule TAKE 3 CAPSULES (60 MG TOTAL) BY MOUTH DAILY    EPINEPHrine  0.3 mg/0.3 mL auto-injector INJECT 0.3ML (0.3MG ) SUBCUTANEOUSLY AS NEEDED FOR ANAPHYLAXIS (ACUTE AND CHRONIC RESPIRATORY FAILURE WITH HYPOXIA)    exemestane  25 mg tablet Take 1 tablet (25 mg total) by mouth daily.    fluticasone -vilanterol 100-25 mcg/act inhaler INHALE 1 PUFF BY MOUTH EVERY DAY (ACUTE AND CHRONIC RESPIRATORY FAILURE WITH HYPOXIA)    furosemide  20 mg tablet Take 1 tablet (20 mg total) by mouth daily.    LOSARTAN  100 mg tablet TAKE 1 TAB BY MOUTH EVERY DAY (HEART FAILURE, UNSPECIFIED)    MONTELUKAST  10 mg tablet TAKE 1 TAB BY MOUTH EVERY EVENING (ACUTE AND CHRONIC RESPIRATORY FAYLURE WITH HYPOXIA)    PENICILLIN  V POTASSIUM 500 mg tablet TAKE 1 TAB BY MOUTH TWICE DAILY (BACTERIAL INFECTION)    PYRIDOSTIGMINE  60 mg tablet TAKE 1 TAB BY MOUTH THREE TIMES DAILY (MYASTHENIA GRAVIS)    REPATHA  SURECLICK 140 MG/ML SOAJ INJECT 1 PEN UNDER THE SKIN EVERY 14 DAYS (HEART FAILURE, UNSPECIFIED)    sodium chloride  0.9% IV soln     tiotropium 18 mcg/act inhalation capsule Place 1 capsule (18 mcg total) into inhaler and inhale daily.    ULTOMIRIS 300 MG/3ML injection     ULTOMIRIS 1100 MG/11ML SOLN     ASPIRIN  LOW DOSE 81 MG EC tablet TAKE 1 TAB BY MOUTH EVERY DAY (HEART FAILURE)     No facility-administered medications prior to visit.       Current Outpatient Medications   Medication Sig    ALBUTEROL  90 mcg/act inhaler INHALE 1-2 PUFF BY MOUTH EVERY 4 HOURS AS NEEDED FOR WHEEZING OR SHORTNESS OF BREATH    Albuterol  Sulfate AEPB Takes 1 to 2 puffs every 4 hours as needed for wheezing or SOB Inhale Takes 1 to 2 puffs every 4 hours as needed for wheezing or SOB .    Albuterol  Sulfate AEPB Takes 1 to 2 puffs every 4 hours as needed for wheezing or SOB Inhale Takes 1 to 2 puffs every 4 hours as needed for wheezing or SOB .    aspirin  81 mg EC tablet Take 1 tablet (81 mg total) by mouth daily.    AZATHIOPRINE  50 mg tablet TAKE 1.5 TABS (75MG ) BY MOUTH TWICE DAILY (PREVENT ORGAN REJECTION)    BUPROPION , SR, 200 mg 12 hr tablet TAKE 1 TAB BY MOUTH TWICE DAILY (ANTIDEPRESSANT)    clonazePAM  1 mg tablet TAKE 1.5 TABLET BY MOUTH AT NIGHT    dapagliflozin  (FARXIGA ) 10 mg tablet Take 1 tablet (10 mg total) by mouth daily    DEXILANT  60 MG DR capsule Take 1 capsule (60 mg total) by mouth daily.    DULOXETINE  20 mg DR capsule TAKE 3 CAPSULES (60 MG TOTAL) BY MOUTH DAILY    EPINEPHrine  0.3 mg/0.3 mL auto-injector INJECT 0.3ML (0.3MG ) SUBCUTANEOUSLY AS NEEDED FOR ANAPHYLAXIS (ACUTE AND CHRONIC RESPIRATORY FAILURE WITH HYPOXIA)    exemestane  25 mg tablet Take 1 tablet (25 mg total) by mouth daily.    fluticasone -vilanterol 100-25 mcg/act inhaler INHALE 1 PUFF BY MOUTH EVERY DAY (ACUTE AND CHRONIC  RESPIRATORY FAILURE WITH HYPOXIA)    furosemide  20 mg tablet Take 1 tablet (20 mg total) by mouth daily.    LOSARTAN  100 mg tablet TAKE 1 TAB BY MOUTH EVERY DAY (HEART FAILURE, UNSPECIFIED)    MONTELUKAST  10 mg tablet TAKE 1 TAB BY MOUTH EVERY EVENING (ACUTE AND CHRONIC RESPIRATORY FAYLURE WITH HYPOXIA)    PENICILLIN  V POTASSIUM 500 mg tablet TAKE 1 TAB BY MOUTH TWICE DAILY (BACTERIAL INFECTION)    PYRIDOSTIGMINE  60 mg tablet TAKE 1 TAB BY MOUTH THREE TIMES DAILY (MYASTHENIA GRAVIS)    REPATHA  SURECLICK 140 MG/ML SOAJ INJECT 1 PEN UNDER THE SKIN EVERY 14 DAYS (HEART FAILURE, UNSPECIFIED)    sodium chloride  0.9% IV soln     tiotropium 18 mcg/act inhalation capsule Place 1 capsule (18 mcg total) into inhaler and inhale daily.    ULTOMIRIS 300 MG/3ML injection     ULTOMIRIS 1100 MG/11ML SOLN     [DISCONTINUED] ASPIRIN  LOW DOSE 81 MG EC tablet TAKE 1 TAB BY MOUTH EVERY DAY (HEART FAILURE)    [DISCONTINUED] DEXILANT  60 MG DR capsule Take 1 capsule (60 mg total) by mouth daily.     No current facility-administered medications for this visit.             ACC/AHA Cholesterol Management Guideline Recommendations:   To continue statin treatment, as indicated based on pre-treatment risk assessment.      ASCVD Risk Score    10-year ASCVD risk  N/A. Age > 75. as of 3:05 PM on 09/04/2023.   10-year ASCVD risk with optimal risk factors  cannot be calculated.   Values used to calculate ASCVD Risk Score    Age  51 y.o. Cannot calculate risk because age is not between 82 and 54 years old.   Gender  female   Race  White   HDL Cholesterol  55 mg/dL. (measured on 12/11/2022)   LDL Cholesterol  76 mg/dL. (measured on 12/11/2022)   Total Cholesterol  160 mg/dL. (measured on 12/11/2022)   Systolic Blood Pressure  100 mm Hg. (measured on 09/04/2023)   Blood Pressure Medication Present  Yes   Smoking Status  currently not a smoker   Diabetes Present  Yes     Click here for the [facility] ASCVD Cardiovascular Risk Estimator Plus tool Office manager).           Assessment:     RAYGEN LINQUIST is 88 y.o. female with    Non-ischemic Cardiomyopathy, EF 35-40% Echo in 2023, down to 22% Echo 05/2023  (G2211 - responsible for the longitudinal management of complex or chronic conditions that require ongoing care and coordination).      - avoiding BBlockers due to myasthenia gravis  - cardiac cath with no CAD in May 2025  - discussed screening of 1st degree relatives with pt and daughter. Pt is adopted. 2 kids    Abnormal Myoview  in 2023: There is a moderate-severe perfusion defect in the distal anteroseptal wall and apex, suggestive for MI.    SOB/dyspnea. Uses oxygen concentrator  Abnormal PFTs, h/o COPD   Abnormal EKG with LBBB and PACs  Former tobacco use  Subtle calcifications of the aortic root. Moderately calcified thoracic aorta and its main branch vessels.  Intolerant to Statins  Frail and deconditioned     Plan:     Losartan  100 mg  DC'd ASA    Repatha  - discussed compliance   Farxiga   DC'd Aldactone  given high K and AKI  Refer to EP if  amenable. The patient is reluctant to have cardiac procedures  If vitals allow, may consider low dose MTP    Orders Placed This Encounter    ECG 12-Lead Clinic Performed           Return in about 6 weeks (around 10/16/2023).        GDMT for Heart Failure with Reduced Ejection Fraction   Most recent LVEF: 22%, HFrEF present on problem list: Yes.   For the most accurate recommendations, please ensure that a LVEF is present and HFrEF is added to the problem list, if applicable      GDMT Medication Class Current Prescription Recommendation   Specified Beta Blockers^ No HFrEF beta blocker found Patient has a listed allergy to beta blockers   ACEi, ARB, ARNi^^ Losartan  Patient is on ARNI (sacubitril-valsartan). Use with caution in patients with hyperkalemia and renail impairment.   MRA No MRA found Patient has a current contraindication or exclusion for MRA. Use with caution in patients with hyperkalemia and renal impairment   SGLT2i Dapagliflozin  Patient is on SGLT2i. Use with caution in patients with renal impairment.   ^ Specified beta blockers: metoprolol succinate, carvedilol, bisoprolol   EVELENE ARNI (sacubitril/valsartan) preferred. Criteria for ARNI initiation: Discontinuation of ACEI for at least 36 hours, eGFR>=30, K<=5.2, no history of angioedema   Click here for ACC / AHA HFrEF GDMT recommendations         There are no Patient Instructions on file for this visit.    Future Appointments   Date Time Provider Department Center   09/18/2023  9:00 AM [name], MD GERI IMS [address]   10/14/2023  2:45 PM [facility] (HOSP) [facility] [place]   10/23/2023 10:00 AM Lestine Ezella HERO., MD HEM/ONC [address]         Author: Breuna Loveall, MD

## 2023-09-05 ENCOUNTER — Ambulatory Visit: Payer: PRIVATE HEALTH INSURANCE

## 2023-09-05 MED ORDER — CLONAZEPAM 1 MG PO TABS
ORAL_TABLET | ORAL | 1 refills | 35.00000 days | Status: AC
Start: 2023-09-05 — End: ?

## 2023-09-06 ENCOUNTER — Telehealth: Payer: BLUE CROSS/BLUE SHIELD

## 2023-09-06 NOTE — Telephone Encounter
 Orders Request  Per [facility]     What is being requested? (Tests, Labs, Imaging, etc.):     Reason for the request: : Home Health needs Dr order to administer all of pt medications ( can accept verbal order as well )   Not safe for pt to  administer her own  medications       Where does the patient want to be seen?      If outside [facility], what is the fax number to the facility?      Has the patient seen their doctor for this matter? N/a    Last office visit: 5/29/025    Patient or caller was offered an appointment but declined.    Patient or caller has been notified of the turnaround time of 1-2 business day(s).

## 2023-09-09 NOTE — Telephone Encounter
 Forwarded by: Noella Baton

## 2023-09-09 NOTE — Telephone Encounter
 Call Back Request      Reason for call back: Elenor, nurse case manager from [facility], called to f/u. Requesting for an order for Assisted Living to administer and monitor medications or call for a verbal    F: [phone]      Any Symptoms:  []  Yes  [x]  No      If yes, what symptoms are you experiencing:    Duration of symptoms (how long):    Have you taken medication for symptoms (OTC or Rx):      If call was taken outside of clinic hours:    [] Patient or caller has been notified that this message was sent outside of normal clinic hours.     [] Patient or caller has been warm transferred to the physician's answering service. If applicable, patient or caller informed to please call us  back if symptoms progress.  Patient or caller has been notified of the turnaround time of 1-2 business day(s).

## 2023-09-12 ENCOUNTER — Ambulatory Visit: Payer: Commercial Managed Care - Pharmacy Benefit Manager

## 2023-09-17 MED ORDER — DAPAGLIFLOZIN PROPANEDIOL 10 MG PO TABS
10 mg | ORAL_TABLET | Freq: Every day | ORAL | 3 refills | 30.00000 days | Status: AC
Start: 2023-09-17 — End: ?

## 2023-09-17 MED ORDER — EXEMESTANE 25 MG PO TABS
25 mg | ORAL_TABLET | Freq: Every day | ORAL | 3 refills | Status: AC
Start: 2023-09-17 — End: ?

## 2023-09-18 ENCOUNTER — Ambulatory Visit: Payer: BLUE CROSS/BLUE SHIELD | Attending: Geriatric Medicine

## 2023-09-19 ENCOUNTER — Ambulatory Visit: Payer: BLUE CROSS/BLUE SHIELD | Attending: Student in an Organized Health Care Education/Training Program

## 2023-09-19 ENCOUNTER — Ambulatory Visit: Payer: BLUE CROSS/BLUE SHIELD | Attending: Geriatric Medicine

## 2023-09-19 ENCOUNTER — Ambulatory Visit: Payer: Commercial Managed Care - Pharmacy Benefit Manager | Attending: Geriatric Medicine

## 2023-09-19 ENCOUNTER — Ambulatory Visit: Payer: PRIVATE HEALTH INSURANCE | Attending: Geriatric Medicine
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
## 2023-09-24 ENCOUNTER — Ambulatory Visit: Payer: Commercial Managed Care - Pharmacy Benefit Manager | Attending: Geriatric Medicine

## 2023-09-24 DIAGNOSIS — Z23 Encounter for immunization: Secondary | ICD-10-CM

## 2023-09-24 DIAGNOSIS — E1122 Type 2 diabetes mellitus with diabetic chronic kidney disease: Secondary | ICD-10-CM

## 2023-09-24 DIAGNOSIS — N1832 Type 2 diabetes mellitus with stage 3b chronic kidney disease, without long-term current use of insulin (HCC/RAF): Secondary | ICD-10-CM

## 2023-09-27 ENCOUNTER — Telehealth: Payer: Commercial Managed Care - Pharmacy Benefit Manager

## 2023-09-27 DIAGNOSIS — G7001 Myasthenia gravis with (acute) exacerbation: Secondary | ICD-10-CM

## 2023-09-27 NOTE — Telephone Encounter
 Medication Verification      Medication: azathioprine     Caller would like to verify the following (Please check all that applies):    [x]  Dosage    []  Quantity      []  Directions     Additional information?  Pharmacy wants to change to dosage to 75 mg    Patient or caller has been notified of the turnaround time of 1-2 business day(s).

## 2023-09-28 MED ORDER — AZATHIOPRINE 50 MG PO TABS
75 mg | ORAL_TABLET | Freq: Every day | ORAL | 2 refills | 30.00000 days | Status: AC
Start: 2023-09-28 — End: ?

## 2023-10-04 ENCOUNTER — Other Ambulatory Visit: Payer: BLUE CROSS/BLUE SHIELD

## 2023-10-04 ENCOUNTER — Telehealth: Payer: MEDICARE

## 2023-10-04 NOTE — Telephone Encounter
 Tried to reach patient LVM to cb to r/s follow up 08/27 due to MD surgery

## 2023-10-09 ENCOUNTER — Ambulatory Visit: Payer: MEDICARE

## 2023-10-09 DIAGNOSIS — I5022 Chronic systolic (congestive) heart failure: Principal | ICD-10-CM

## 2023-10-09 NOTE — Consults
 Cardiac Electrophysiology Out Patient Consultation  [facility]    Date/Time: 10/09/2023  Referring Physician: [name], MD    Patient: [name]  Date of Birth: [birth year]    Reason for Consultation:   EP Care    Chief complain:   Chief Complaint   Patient presents with    New Patient       Problem List:   [name] is a 88 y.o. female who presents for initial EP Consult with history of:    - HFrEF with EF 20%   - LBBB QRS 145 msec   - Class III  - NICM  - Myasthenia gravis  - COPD  - Home oxygen PRN     History of Present Illness:   [name] is a 88 y.o. female who presents for initial EP Consult with me due to a history of as above who is referred for evaluation for progressive worsening of systolic HF with LBBB. She confirmed that despite her myasthenia and COPD which are significantly impacting her quality of life the last six months has decreased as the HF worsened.     Past Medical History:   As above in addition to:    Past Medical History:   Diagnosis Date    Asthma     Breast cancer (HCC/RAF) 12/20/2018    CHF (congestive heart failure) (HCC/RAF)     COPD (chronic obstructive pulmonary disease) (HCC/RAF)     Diabetes mellitus (HCC/RAF)     Renal disorder      Past Surgical History:   History reviewed. No pertinent surgical history.    Family History:   History reviewed. No pertinent family history.    Negative for sudden cardiac death, premature CAD    Social History:     Social History     Tobacco Use    Smoking status: Former     Current packs/day: 0.00     Types: Cigarettes     Quit date: 1985     Years since quitting: 40.6    Smokeless tobacco: Former   Substance Use Topics    Alcohol use: Never    Drug use: Never       Home Medications:     Outpatient Medications Prior to Visit   Medication Sig Dispense Refill    ALBUTEROL  90 mcg/act inhaler INHALE 1-2 PUFF BY MOUTH EVERY 4 HOURS AS NEEDED FOR WHEEZING OR SHORTNESS OF BREATH 18 g 10    aspirin  81 mg EC tablet Take 1 tablet (81 mg total) by mouth daily. 90 tablet 3    azaTHIOprine  50 mg tablet Take 1.5 tablets (75 mg total) by mouth daily. 135 tablet 2    BUPROPION , SR, 200 mg 12 hr tablet TAKE 1 TAB BY MOUTH TWICE DAILY (ANTIDEPRESSANT) 60 tablet 10    clonazePAM  1 mg tablet TAKE 1 AND HALF TABLET BY MOUTH AT NIGHT 135 tablet 1    dapagliflozin  (FARXIGA ) 10 mg tablet Take 1 tablet (10 mg total) by mouth daily. 90 tablet 3    DEXILANT  60 MG DR capsule Take 1 capsule (60 mg total) by mouth daily. 90 capsule 3    DULOXETINE  20 mg DR capsule TAKE 3 CAPSULES (60 MG TOTAL) BY MOUTH DAILY 270 capsule 3    EPINEPHrine  0.3 mg/0.3 mL auto-injector INJECT 0.3ML (0.3MG ) SUBCUTANEOUSLY AS NEEDED FOR ANAPHYLAXIS (ACUTE AND CHRONIC RESPIRATORY FAILURE WITH HYPOXIA) 2 each 1    exemestane  25 mg tablet Take 1  tablet (25 mg total) by mouth daily. 90 tablet 3    fluticasone -vilanterol 100-25 mcg/act inhaler INHALE 1 PUFF BY MOUTH EVERY DAY (ACUTE AND CHRONIC RESPIRATORY FAILURE WITH HYPOXIA) 180 each 10    furosemide  20 mg tablet Take 1 tablet (20 mg total) by mouth daily. 90 tablet 3    LOSARTAN  100 mg tablet TAKE 1 TAB BY MOUTH EVERY DAY (HEART FAILURE, UNSPECIFIED) 30 tablet 10    MONTELUKAST  10 mg tablet TAKE 1 TAB BY MOUTH EVERY EVENING (ACUTE AND CHRONIC RESPIRATORY FAYLURE WITH HYPOXIA) 30 tablet 10    PENICILLIN  V POTASSIUM 500 mg tablet TAKE 1 TAB BY MOUTH TWICE DAILY (BACTERIAL INFECTION) 60 tablet 10    PYRIDOSTIGMINE  60 mg tablet TAKE 1 TAB BY MOUTH THREE TIMES DAILY (MYASTHENIA GRAVIS) 90 tablet 10    REPATHA  SURECLICK 140 MG/ML SOAJ INJECT 1 PEN UNDER THE SKIN EVERY 14 DAYS (HEART FAILURE, UNSPECIFIED) 2 mL 10    sodium chloride  0.9% IV soln       tiotropium 18 mcg/act inhalation capsule Place 1 capsule (18 mcg total) into inhaler and inhale daily. 90 capsule 3    ULTOMIRIS 300 MG/3ML injection       ULTOMIRIS 1100 MG/11ML SOLN       Albuterol  Sulfate AEPB Takes 1 to 2 puffs every 4 hours as needed for wheezing or SOB Inhale Takes 1 to 2 puffs every 4 hours as needed for wheezing or SOB . 3 each 3    Albuterol  Sulfate AEPB Takes 1 to 2 puffs every 4 hours as needed for wheezing or SOB Inhale Takes 1 to 2 puffs every 4 hours as needed for wheezing or SOB . 3 each 3     No facility-administered medications prior to visit.       Allergies:     Allergies   Allergen Reactions    Beta Adrenergic Blockers Other (See Comments)     Avoid d/t Myasthenia Gravis  Avoid d/t Myasthenia Gravis      Levofloxacin Other (See Comments)     Flare of Myasthenia Gravis    Macrolides And Ketolides Other (See Comments)     Avoid d/t Myasthenia Gravis  Use with caution d/t Myasthenia Gravis  Use with caution d/t Myasthenia Gravis      Sulfa Antibiotics Other (See Comments)     May possibly have caused deafness in one ear  May possibly have caused deafness in one ear  other      Botulinum Toxins Other (See Comments)     Muscle weakness r/t Myasthenia Gravis  Muscle weakness r/t Myasthenia Gravis      Pollen Extract Wheezing    Statins Other (See Comments)     Muscle aches  Muscle aches  Muscle aches      Plastic Tape Itching and Other (See Comments)     Turns the skin red where applied       Review of Systems:   A 14-system review of systems was obtained and unremarkable except as noted above.    Physical Exam:   Vitals: BP 136/74  ~ Pulse (!) 101  ~ Wt 139 lb (63 kg)  ~ SpO2 94%  ~ BMI 23.13 kg/m?   General: Awake. No acute distress. Well-developed and well-nourished.  Eyes: Extraocular movements intact. No scleral icterus or conjunctival pallor.  ENMT: Oropharynx clear. Mucous membranes moist.  Neck: Supple. Trachea midline. No appreciable thyromegaly.  Lungs: Clear to auscultation bilaterally. No accessory muscle use.  Cardiovascular: No precordial  lifts or thrills. Regular rate and rhythm with normal S1 and S2. No gallops, murmurs, or rubs appreciated. Carotid pulses 2+ bilaterally without bruits. Jugular venous pressure 7 cm. Radial, femoral, and dorsalis pedis pulses 2+ bilaterally. No lower extremity edema.  Abdomen: Soft, nontender, and nondistended. Normoactive bowel sounds. No hepatomegaly or fluid wave.  Skin: Warm, dry. No rash.  Neuro: Grossly nonfocal.    Laboratory Data:     Lab Results   Component Value Date    NA 139 09/24/2023    K 4.5 09/24/2023    CL 102 09/24/2023    CO2 23 09/24/2023    BUN 27 (H) 09/24/2023    CREAT 1.75 (H) 09/24/2023    CALCIUM  9.6 09/24/2023    MG 2.1 (H) 06/14/2023     Lab Results   Component Value Date    CHOL 160 12/11/2022    CHOLHDL 55 12/11/2022    CHOLDLQ 76 12/11/2022    TRIGLY 181 (H) 12/11/2022    Lab Results   Component Value Date    WBC 5.52 09/24/2023    HGB 13.8 09/24/2023    HCT 43.0 09/24/2023    MCV 97.3 09/24/2023    PLT 299 09/24/2023    INR 0.9 07/18/2023    APTT <24.0 (L) 07/18/2023     Lab Results   Component Value Date    AST 21 09/24/2023    ALT 8 09/24/2023    ALKPHOS 80 09/24/2023    BILITOT 0.2 09/24/2023    ALBUMIN 4.2 09/24/2023     Lab Results   Component Value Date    BNP 355 (H) 08/19/2023    TSH 1.6 09/24/2023    HGBA1C 6.0 (H) 04/17/2023        Diagnostic/Imaging Studies:     Electrocardiogram (all ECGs reviewed by me)  July 2025 - SR with LBBB 145 msec    Cardiac Device Interrogation  N/A    Event Recorder (reviewed by me)  N/A    Echocardiogram (reviewed by me)  May 2025  1. Mildly increased left ventricular size.   2. Global hypokinesis of the left ventricle.   3. LV systolic function is severely reduced. The calculated ejection fraction (Simpson's) is 22 %.   4. Diastolic function is indeterminant.   5. Mild to moderate mitral valve regurgitation.   6. Compared to prior study on 06/08/2022, LV EF has decreased and LV size has increased.    April 2024  1. Normal left ventricular size.   2. Mild basal septal left ventricular hypertrophy.   3. Left ventricular ejection fraction is approximately 45 to 50%.   4. Abnormal LV diastolic function (Grade I).   5. There is no significant valvular dysfunction.   6. Compared to prior study on 03/14/2021, EF has improved from 35-40%.    EPS/RFA  N/A    Coronary Angiogram (reviewed by me)  May 2025  Selective Coronary Angiography:   1. Dominance: Right   2. LM: The left main was a large caliber vessel, that bifurcated to give rise to the LAD and left circumflex arteries. There was no significant disease in the LM.   3. LAD: The left anterior descending artery was a large caliber vessel, that gave rise to several septal and diagonal branches. There was no significant epicardial disease in the LAD or its branches, just mild luminal irregularities.   4. LCx: The left circumflex artery was a large caliber vessel, that gave rise to two obtuse marginals. There was no  significant epicardial disease in the LCx or its branches.   5. RCA: The right coronary artery was a large caliber, dominant vessel, that gave rise to a moderate caliber posterolateral branch, and a moderate caliber posterior descending artery. There was no significant epicardial disease in the RCA or its branches.     Stress Test  N/A    Advanced Cardiac Imaging (reviewed by me)  N/A    Review of Data:  I have:   [x]  Reviewed/ordered []   1 []   2 [x]   >=  3 unique laboratory, radiology, and/or diagnostic tests as noted  [x]  Reviewed/ordered []   1 []   2 [x]   >=  3 prior external notes and incorporated into patient assessment  []  Discussed management or test interpretation with external provider(s) as noted:    Lab Studies Reviewed on this Encounter:  Reviewed Lab Comment   [x]   CBC    [x]   Basic Metabolic Panel    []   Lipid Panel    []  HgbA1c    []  Troponin    [x]   BNP    [x]   Other      Diagnostic Studies Reviewed/Ordered on this Encounter:  Reviewed Ordered Diagnostic Test   [x]   []   ECG   []   []   Stress ECG   [x]   []   Echo (TTE/TEE)   []   []   Stress TTE   []   []   MPS   []   []   Event/Holter   []   []   Catheterization   []   []   Cardiac Device Interrogation   []   [x]   CRT-P Implant      Impression: TIFFNEY HAUGHTON is a 88 y.o. female with:    - HFrEF with EF 20%   - LBBB QRS 145 msec   - Class III  - NICM  - Myasthenia gravis  - COPD  - Home oxygen PRN    Discussion:     For cardiac resynchronization implant (CRT-P and CRT-D) were discussed in detail today.    The indication, procedure description, follow up care, including benefits, alternateive and risks associated with Bi-Ventricular cardioverter defibrillator vs biventricular pacemaker with coronary sinus lead/left bundle pacing lead were explained to the patient in detail.  The use of diagrams, visual aids, imaging, illustrations, patient literature were employed to aid in the understanding of the pathophysiology of the disease process and for enhanced understanding management/treatment/interventions and others aspects of care/management.      The overall succsses rate of being able to placed a left ventricular lead and the likelihood of clinical response/benefit were discussed in detail.  The risks include, but are not limited to: bleeding, hematoma, deep vein thrombosis, infection, AV fistula,  Blood vessel damage, lung injury or collapse, heart injury or perforation, heart attack, stroke, device or lead malfunction, lead dislogement, phrenic pacing, atrial or ventricular arrhythmias, worsening heart failure and even death among others.     The patient fully understood the procedure and the risks, wished to proceed based on a shared decision model.    Recommendations/Plan:      - Proceed with CRT-P per patient preference   - Plan procedure first half of the day (COPD, Myasthenia, others)   - Overnight stay anticipated    Current plan of care was discussed in detail with the patient and all questions were answered.     Eric Melnick, MD  Attending Electrophysiologist  [facility]

## 2023-10-09 NOTE — Patient Instructions
 Dear CECILY LAWHORNE ~ MRN: [id number]     These  instructions are meant to assist you in understanding the process to schedule your cardiac procedure (Implantable Cardiac Device, pacemaker implant).    Please give our staff 10 business days to notify you of your procedure date (by phone or my chart message). However if you do not receive a message by that time or need to contact us  sooner please call the main office at [phone] to schedule your procedure.    Sincerely,     Eric Melnick, MD  Attending Physician, Cardiac Electrophysiology

## 2023-10-16 ENCOUNTER — Other Ambulatory Visit: Payer: BLUE CROSS/BLUE SHIELD

## 2023-10-17 ENCOUNTER — Ambulatory Visit: Payer: MEDICARE

## 2023-10-17 DIAGNOSIS — I10 Essential (primary) hypertension: Secondary | ICD-10-CM

## 2023-10-17 DIAGNOSIS — E785 Hyperlipidemia, unspecified: Secondary | ICD-10-CM

## 2023-10-17 DIAGNOSIS — I5022 Chronic systolic (congestive) heart failure: Principal | ICD-10-CM

## 2023-10-17 DIAGNOSIS — I700 Atherosclerosis of aorta: Secondary | ICD-10-CM

## 2023-10-17 MED ORDER — METOPROLOL SUCCINATE ER 25 MG PO TB24
25 mg | ORAL_TABLET | Freq: Every day | ORAL | 3 refills | Status: AC
Start: 2023-10-17 — End: ?

## 2023-10-17 NOTE — Consults
 CARDIOLOGY NOTE      PATIENT: [name]   MRN: [id number]   DOB: 02-28-1935   DATE OF SERVICE: 10/17/2023    PCP:  [name], MD      Problem List Items Addressed This Visit    None  Visit Diagnoses         Systolic CHF, chronic (HCC/RAF)    -  Primary      Atherosclerosis of aorta          Dyslipidemia          Essential hypertension                    Interval History:     10/17/2023:   With MG, she gets drooping in eyelids and weakness in the legs     09/04/2023:   Pt with dyspnea and chest pain with exertion  She is taking Lasix  on daily basis        Daryel Cornet, MD  Aundra Pung, MD; [name], MD  Hi,    Sorry for the late reply. I'm okay with started a beta blocker from myasthenia gravis perspective. The risk of exacerbation in this case is very small. Different classes of beta blockers are not studied as far as I know, so I do not have any preference. Lower dose is likely safer. Please also keep in mind that she has COPD.    Best,  Yigit          Previous Messages       ----- Message -----  From: Douglas Rooks, MD  Sent: 08/08/2023  11:53 AM PDT  To: Penne LOIS Danetta, MD; Cornet Daryel, MD    Hi,  Pt with heart failure and reduced LV EF.  Do you have a concern for chronic use of BBlockers? Any preference for Metoprolol  or Coreg?    BBlockrs are listed in her Allergies: Avoid d/t Myasthenia Gravis        Thanks,  Rt       07/25/2023:  A/w daughter  Recent hospitalization for dyspnea.   She was started on Lasix . She was taking up to 3 tabs per day  She last took Lasix  prior to angiogram  Uses a walker   Pt notes chest tightness and dyspnea    08/2022:  The patient feels SOB/dyspnea and tired  She notes chest pain and fibromyalgia   Ambulates with a walker  ET: 1/2 block, stable     Prior Encounter:     This is a 88 y.o. female who is here for cardiac evaluation.  Indication:   SOB    The patient is a/w friend, Winston    Pt was recently hospitalized with SOB and chest pain. Pt ambulates with a walker for precautionary reasons given history of falls.   With activity, the patient reports SOB/dypnea. She walks the dog 1/2 block  Her symptoms have been ongoing for the past 3 years, progressive worsening.   The patient notes allergies and asthma.   About 1 year ago, she could walk 1.5 blocks         Past Medical History:   Diagnosis Date    Asthma     Breast cancer (HCC/RAF) 12/20/2018    CHF (congestive heart failure) (HCC/RAF)     COPD (chronic obstructive pulmonary disease) (HCC/RAF)     Diabetes mellitus (HCC/RAF)     Renal disorder        No past surgical history  on file.    Allergies:   Allergies   Allergen Reactions    Beta Adrenergic Blockers Other (See Comments)     Avoid d/t Myasthenia Gravis  Avoid d/t Myasthenia Gravis      Levofloxacin Other (See Comments)     Flare of Myasthenia Gravis    Macrolides And Ketolides Other (See Comments)     Avoid d/t Myasthenia Gravis  Use with caution d/t Myasthenia Gravis  Use with caution d/t Myasthenia Gravis      Sulfa Antibiotics Other (See Comments)     May possibly have caused deafness in one ear  May possibly have caused deafness in one ear  other      Botulinum Toxins Other (See Comments)     Muscle weakness r/t Myasthenia Gravis  Muscle weakness r/t Myasthenia Gravis      Pollen Extract Wheezing    Statins Other (See Comments)     Muscle aches  Muscle aches  Muscle aches      Plastic Tape Itching and Other (See Comments)     Turns the skin red where applied       Social History     Socioeconomic History    Marital status: Widowed   Tobacco Use    Smoking status: Former     Current packs/day: 0.00     Types: Cigarettes     Quit date: 1985     Years since quitting: 40.6    Smokeless tobacco: Former   Substance and Sexual Activity    Alcohol use: Never    Drug use: Never    Sexual activity: Not Currently     Social Drivers of Health     Financial Resource Strain: Low Risk  (06/12/2023)    Financial Resource Strain     Difficulty of Paying Living Expenses: Not hard at all       No family history on file.  Adopted     Objective:     BP 128/74  ~ Pulse (!) 109  ~ Ht 5' 5'' (1.651 m)  ~ SpO2 95%  ~ BMI 23.13 kg/m?     Vitals:    10/17/23 1440   BP: 128/74   Pulse: (!) 109   SpO2: 95%   Height: 5' 5'' (1.651 m)       BP Readings from Last 3 Encounters:   10/17/23 128/74   10/09/23 136/74   09/24/23 126/71       Body mass index is 23.13 kg/m?SABRA    Wt Readings from Last 3 Encounters:   10/09/23 139 lb (63 kg)   09/24/23 137 lb (62.1 kg)   09/04/23 136 lb 9.6 oz (62 kg)          General Exam: no apparent distress.    Neurologic/Psychiatric: alert and oriented. Affect is appropriate.  HEENT: Sclerae are anicteric. There is no epistaxis.  Cardiac: regular in rate and rhythm.    Respiratory: non-labored breathing. No use of accessory muscles. No wheeze.  Abdomen: bowel sounds are present. Abdomen is soft. No rebound.    Extremities: no edema. No cyanosis or clubbing.    Integument: no petechiae. No acute rash.         Laboratory:  Lab Results   Component Value Date    NA 139 09/24/2023    K 4.5 09/24/2023    CL 102 09/24/2023    CO2 23 09/24/2023    BUN 27 (H) 09/24/2023    CREAT 1.75 (H) 09/24/2023  GLUCOSE 96 09/24/2023    CALCIUM  9.6 09/24/2023     Lab Results   Component Value Date    CKTOT 51 12/11/2022    CKTOT 75 07/05/2022    CKTOT 54 01/07/2022    TROPONIN 13 (H) 06/11/2023    TROPONIN 32 (H) 06/11/2023    TROPONIN 0.05 (H) 12/19/2020    TROPONIN 0.07 (H) 12/19/2020    TROPONIN 0.06 (H) 12/18/2020     Lab Results   Component Value Date    BNP 355 (H) 08/19/2023     Lab Results   Component Value Date    WBC 5.52 09/24/2023    HGB 13.8 09/24/2023    HCT 43.0 09/24/2023    MCV 97.3 09/24/2023    PLT 299 09/24/2023     Lab Results   Component Value Date    CHOL 160 12/11/2022    CHOLHDL 55 12/11/2022    CHOLDLQ 76 12/11/2022    TRIGLY 181 (H) 12/11/2022     Lab Results   Component Value Date    PT 12.5 07/18/2023    INR 0.9 07/18/2023     No components found for: ''HA1C''  Lab Results   Component Value Date    TSH 1.6 09/24/2023       Patient Active Problem List   Diagnosis    Acute medial meniscal tear, left, subsequent encounter    Angioedema    Anxiety    Asthmatic bronchitis, mild persistent, with acute exacerbation    Chronic cholecystitis    Chronic interstitial cystitis    CKD (chronic kidney disease), stage III (HCC/RAF)    COPD mixed type (HCC/RAF)    Major depression, recurrent, chronic (HCC/RAF)    Diabetes mellitus (HCC/RAF)    Esophageal reflux    Eustachian tube dysfunction    Fibromyalgia    H/O arthroscopy    Insomnia    Myasthenia gravis with exacerbation (HCC/RAF)    Nocturnal hypoxemia    Osteoarthritis of knee    Renal cyst    Seasonal and perennial allergic rhinitis    Spondylosis without myelopathy or radiculopathy, lumbar region    Urethral caruncle    Vaginal atrophy    Benzodiazepine dependence (HCC/RAF)    Breast cancer (HCC/RAF)    Refractive error    Ptosis of eyelid    Pseudophakia of both eyes    Aortic calcification    HTN (hypertension)    Immunosuppressed status    Acute chest pain    Atypical chest pain    Heart failure with reduced ejection fraction LVEF <=40% (HCC/RAF)    Epiretinal membrane, bilateral    PVD (posterior vitreous detachment), bilateral    Chorioretinal scars, bilateral    Hyperkalemia       Outpatient Medications Prior to Visit   Medication Sig    ALBUTEROL  90 mcg/act inhaler INHALE 1-2 PUFF BY MOUTH EVERY 4 HOURS AS NEEDED FOR WHEEZING OR SHORTNESS OF BREATH    aspirin  81 mg EC tablet Take 1 tablet (81 mg total) by mouth daily.    azaTHIOprine  50 mg tablet Take 1.5 tablets (75 mg total) by mouth daily.    BUPROPION , SR, 200 mg 12 hr tablet TAKE 1 TAB BY MOUTH TWICE DAILY (ANTIDEPRESSANT)    clonazePAM  1 mg tablet TAKE 1 AND HALF TABLET BY MOUTH AT NIGHT    dapagliflozin  (FARXIGA ) 10 mg tablet Take 1 tablet (10 mg total) by mouth daily.    DEXILANT  60 MG DR capsule Take 1 capsule (60  mg total) by mouth daily.    DULOXETINE  20 mg DR capsule TAKE 3 CAPSULES (60 MG TOTAL) BY MOUTH DAILY    EPINEPHrine  0.3 mg/0.3 mL auto-injector INJECT 0.3ML (0.3MG ) SUBCUTANEOUSLY AS NEEDED FOR ANAPHYLAXIS (ACUTE AND CHRONIC RESPIRATORY FAILURE WITH HYPOXIA)    exemestane  25 mg tablet Take 1 tablet (25 mg total) by mouth daily.    fluticasone -vilanterol 100-25 mcg/act inhaler INHALE 1 PUFF BY MOUTH EVERY DAY (ACUTE AND CHRONIC RESPIRATORY FAILURE WITH HYPOXIA)    furosemide  20 mg tablet Take 1 tablet (20 mg total) by mouth daily.    LOSARTAN  100 mg tablet TAKE 1 TAB BY MOUTH EVERY DAY (HEART FAILURE, UNSPECIFIED)    MONTELUKAST  10 mg tablet TAKE 1 TAB BY MOUTH EVERY EVENING (ACUTE AND CHRONIC RESPIRATORY FAYLURE WITH HYPOXIA)    PENICILLIN  V POTASSIUM 500 mg tablet TAKE 1 TAB BY MOUTH TWICE DAILY (BACTERIAL INFECTION)    PYRIDOSTIGMINE  60 mg tablet TAKE 1 TAB BY MOUTH THREE TIMES DAILY (MYASTHENIA GRAVIS)    REPATHA  SURECLICK 140 MG/ML SOAJ INJECT 1 PEN UNDER THE SKIN EVERY 14 DAYS (HEART FAILURE, UNSPECIFIED)    sodium chloride  0.9% IV soln     tiotropium 18 mcg/act inhalation capsule Place 1 capsule (18 mcg total) into inhaler and inhale daily.    ULTOMIRIS 300 MG/3ML injection     ULTOMIRIS 1100 MG/11ML SOLN      No facility-administered medications prior to visit.       Current Outpatient Medications   Medication Sig    ALBUTEROL  90 mcg/act inhaler INHALE 1-2 PUFF BY MOUTH EVERY 4 HOURS AS NEEDED FOR WHEEZING OR SHORTNESS OF BREATH    aspirin  81 mg EC tablet Take 1 tablet (81 mg total) by mouth daily.    azaTHIOprine  50 mg tablet Take 1.5 tablets (75 mg total) by mouth daily.    BUPROPION , SR, 200 mg 12 hr tablet TAKE 1 TAB BY MOUTH TWICE DAILY (ANTIDEPRESSANT)    clonazePAM  1 mg tablet TAKE 1 AND HALF TABLET BY MOUTH AT NIGHT    dapagliflozin  (FARXIGA ) 10 mg tablet Take 1 tablet (10 mg total) by mouth daily.    DEXILANT  60 MG DR capsule Take 1 capsule (60 mg total) by mouth daily.    DULOXETINE  20 mg DR capsule TAKE 3 CAPSULES (60 MG TOTAL) BY MOUTH DAILY    EPINEPHrine  0.3 mg/0.3 mL auto-injector INJECT 0.3ML (0.3MG ) SUBCUTANEOUSLY AS NEEDED FOR ANAPHYLAXIS (ACUTE AND CHRONIC RESPIRATORY FAILURE WITH HYPOXIA)    exemestane  25 mg tablet Take 1 tablet (25 mg total) by mouth daily.    fluticasone -vilanterol 100-25 mcg/act inhaler INHALE 1 PUFF BY MOUTH EVERY DAY (ACUTE AND CHRONIC RESPIRATORY FAILURE WITH HYPOXIA)    furosemide  20 mg tablet Take 1 tablet (20 mg total) by mouth daily.    LOSARTAN  100 mg tablet TAKE 1 TAB BY MOUTH EVERY DAY (HEART FAILURE, UNSPECIFIED)    MONTELUKAST  10 mg tablet TAKE 1 TAB BY MOUTH EVERY EVENING (ACUTE AND CHRONIC RESPIRATORY FAYLURE WITH HYPOXIA)    PENICILLIN  V POTASSIUM 500 mg tablet TAKE 1 TAB BY MOUTH TWICE DAILY (BACTERIAL INFECTION)    PYRIDOSTIGMINE  60 mg tablet TAKE 1 TAB BY MOUTH THREE TIMES DAILY (MYASTHENIA GRAVIS)    REPATHA  SURECLICK 140 MG/ML SOAJ INJECT 1 PEN UNDER THE SKIN EVERY 14 DAYS (HEART FAILURE, UNSPECIFIED)    sodium chloride  0.9% IV soln     tiotropium 18 mcg/act inhalation capsule Place 1 capsule (18 mcg total) into inhaler and inhale daily.    ULTOMIRIS  300 MG/3ML injection     metoprolol  succinate 25 mg 24 hr tablet Take 1 tablet (25 mg total) by mouth daily.    ULTOMIRIS 1100 MG/11ML SOLN      No current facility-administered medications for this visit.             ACC/AHA Cholesterol Management Guideline Recommendations:   To continue statin treatment, as indicated based on pre-treatment risk assessment.      ASCVD Risk Score    10-year ASCVD risk  N/A. Age > 75. as of 2:55 PM on 10/17/2023.   10-year ASCVD risk with optimal risk factors  cannot be calculated.   Values used to calculate ASCVD Risk Score    Age  88 y.o. Cannot calculate risk because age is not between 13 and 32 years old.   Gender  female   Race  White   HDL Cholesterol  55 mg/dL. (measured on 12/11/2022)   LDL Cholesterol  76 mg/dL. (measured on 12/11/2022)   Total Cholesterol  160 mg/dL. (measured on 12/11/2022)   Systolic Blood Pressure  128 mm Hg. (measured on 10/17/2023)   Blood Pressure Medication Present  Yes   Smoking Status  currently not a smoker   Diabetes Present  Yes     Click here for the [facility] ASCVD Cardiovascular Risk Estimator Plus tool Office manager).           Assessment:     SHERRIN STAHLE is 88 y.o. female with    Non-ischemic Cardiomyopathy, EF 35-40% Echo in 2023, down to 22% Echo 05/2023  - avoided BBlockers due to myasthenia gravis  - cardiac cath with no CAD in May 2025  - discussed screening of 1st degree relatives with pt and daughter. Pt is adopted. 2 kids    Abnormal Myoview  in 2023: There is a moderate-severe perfusion defect in the distal anteroseptal wall and apex, suggestive for MI.    SOB/dyspnea. Uses oxygen concentrator  Abnormal PFTs, h/o COPD   Abnormal EKG with LBBB and PACs  Former tobacco use  Subtle calcifications of the aortic root. Moderately calcified thoracic aorta and its main branch vessels.  Intolerant to Statins  Frail and deconditioned     Plan:     Losartan  100 mg  DC'd ASA    Repatha  - discussed compliance   Farxiga   DC'd Aldactone  given high K and AKI  Seen by Dr. Myrle in EP with plans for CRT   Start low dose MTP    Orders Placed This Encounter    ECG 12-Lead Clinic Performed    metoprolol  succinate 25 mg 24 hr tablet           Return in about 4 weeks (around 11/14/2023).        GDMT for Heart Failure with Reduced Ejection Fraction   Most recent LVEF: 22%, HFrEF present on problem list: Yes.   For the most accurate recommendations, please ensure that a LVEF is present and HFrEF is added to the problem list, if applicable      GDMT Medication Class Current Prescription Recommendation   Specified Beta Blockers^ Metoprolol  Patient has a listed allergy to beta blockers   ACEi, ARB, ARNi^^ Losartan  Patient is on ARNI (sacubitril-valsartan). Use with caution in patients with hyperkalemia and renail impairment.   MRA No MRA found Patient has a current contraindication or exclusion for MRA. Use with caution in patients with hyperkalemia and renal impairment   SGLT2i Dapagliflozin  Patient is on SGLT2i. Use with caution in  patients with renal impairment.   ^ Specified beta blockers: metoprolol  succinate, carvedilol, bisoprolol   EVELENE ARNI (sacubitril/valsartan) preferred. Criteria for ARNI initiation: Discontinuation of ACEI for at least 36 hours, eGFR>=30, K<=5.2, no history of angioedema   Click here for ACC / AHA HFrEF GDMT recommendations         There are no Patient Instructions on file for this visit.    Future Appointments   Date Time Provider Department Center   10/31/2023  3:40 PM [name], MD GERI IMS [address]/Cen   11/13/2023  3:30 PM Daryel Cornet, MD [facility] [address]/Cen         Author: Oluwaseun Cremer, MD

## 2023-10-18 ENCOUNTER — Telehealth: Payer: BLUE CROSS/BLUE SHIELD

## 2023-10-18 NOTE — Telephone Encounter
 Call Back Request      Reason for call back: [facility] states they are requesting a call back to schedule their IMPLANT PERMANENT PACEMAKER - DUAL. Please assist, thank you.     Any Symptoms:  []  Yes  [x]  No      If yes, what symptoms are you experiencing:    Duration of symptoms (how long):    Have you taken medication for symptoms (OTC or Rx):      If call was taken outside of clinic hours:    [] Patient or caller has been notified that this message was sent outside of normal clinic hours.     [] Patient or caller has been warm transferred to the physician's answering service. If applicable, patient or caller informed to please call us  back if symptoms progress.  Patient or caller has been notified of the turnaround time of 1-2 business day(s).

## 2023-10-21 ENCOUNTER — Inpatient Hospital Stay: Payer: BLUE CROSS/BLUE SHIELD

## 2023-10-21 DIAGNOSIS — M810 Age-related osteoporosis without current pathological fracture: Secondary | ICD-10-CM

## 2023-10-21 DIAGNOSIS — C50912 Malignant neoplasm of unspecified site of left female breast: Secondary | ICD-10-CM

## 2023-10-21 DIAGNOSIS — Z170 Estrogen receptor positive status [ER+]: Secondary | ICD-10-CM

## 2023-10-21 DIAGNOSIS — C50911 Malignant neoplasm of unspecified site of right female breast: Secondary | ICD-10-CM

## 2023-10-22 ENCOUNTER — Telehealth: Payer: BLUE CROSS/BLUE SHIELD

## 2023-10-22 NOTE — Telephone Encounter
 Call Back Request      Reason for call back: Sheree from assisted living facility is reviewing patients after visit summary with Dr. Myrle and it says to hold dapagliflozin  (FARXIGA ) 10 mg tablet but doesn't say for how long. Luis would like a call back.     Any Symptoms:  []  Yes  [x]  No      If yes, what symptoms are you experiencing:    Duration of symptoms (how long):    Have you taken medication for symptoms (OTC or Rx):      If call was taken outside of clinic hours:    [] Patient or caller has been notified that this message was sent outside of normal clinic hours.     [] Patient or caller has been warm transferred to the physician's answering service. If applicable, patient or caller informed to please call us  back if symptoms progress.  Patient or caller has been notified of the turnaround time of 1-2 business day(s).

## 2023-10-23 ENCOUNTER — Ambulatory Visit: Payer: MEDICARE | Attending: Hematology & Oncology

## 2023-10-25 ENCOUNTER — Telehealth: Payer: Self-pay | Admitting: Neurology

## 2023-10-25 NOTE — Telephone Encounter
 See other encounter

## 2023-10-25 NOTE — Telephone Encounter
 Reached out to explain that patient needs to hold farxiga  once the procedure is scheduled. The patient will be contacted by [facility] once they have a date/time available.

## 2023-10-27 ENCOUNTER — Telehealth: Payer: Commercial Managed Care - Pharmacy Benefit Manager

## 2023-10-27 DIAGNOSIS — K5792 Diverticulitis of intestine, part unspecified, without perforation or abscess without bleeding: Principal | ICD-10-CM

## 2023-10-27 MED ORDER — AMOXICILLIN-POT CLAVULANATE 500-125 MG PO TABS
1 | ORAL_TABLET | Freq: Two times a day (BID) | ORAL | 0 refills | 7.00000 days | Status: AC
Start: 2023-10-27 — End: ?

## 2023-10-27 NOTE — Progress Notes
 Outpatient Geriatric Telephone Visit     Patient: [name]  MRN: [id number]  PCP: [name], MD  Date: 10/27/2023      Patient's concerns:  Abd pain  Increasing since last night  Minimal po  Mostly constant  Lower abd  Below navel  Similar to prior diverticulitis pain  No n/v  +diarrhea  Worse when pushing on abd      Assessment and Plan:  diverticulitis  Problems:  As above    Plan:  Empiric augmentin  500 bid dose adjusted for eGFR  Allergies considered re: abx choice  ER precautions discussed.        # advice given about precautions for coronavirus    Future Appointments   Date Time Provider Department Center   10/31/2023  3:40 PM [name], MD GERI IMS [address]/Cen   11/13/2023  3:30 PM Daryel Cornet, MD [facility] [address]/Cen   11/19/2023  2:00 PM Tabibiazar, Ramin, MD CRD DIS [address] [facility]         Total time spent on this telephone encounter:  []   5-10 minutes (99441)  []   11-20 minutes (00557)  []   >21 minutes (00556)      [All the following must be checked off in order to bill for this service.]    []   This service is not related to a problem addressed by an E/M encounter in the prior 7 days  []   This service will not result in an office or telemedicine visit within the next 24 hours or soonest available appointment.   []   This service was initiated by the patient.   []   This patient is an established patient in my practice.     The patient has been notified that this visit will result in an E/M charge.    Signed: Penne LOIS Danetta, MD

## 2023-10-27 NOTE — Telephone Encounter
 Outpatient Geriatric Telephone Visit     Patient: [name]  MRN: [id number]  PCP: [name], MD  Date: 10/27/2023      Patient's concerns:        Assessment and Plan:    Problems:      Plan:          # advice given about precautions for coronavirus    Future Appointments   Date Time Provider Department Center   10/31/2023  3:40 PM [name], MD GERI IMS [address]/Cen   11/13/2023  3:30 PM Daryel Cornet, MD [facility] [address] [place]/Cen   11/19/2023  2:00 PM Tabibiazar, Ramin, MD CRD DIS [address] [facility]         Total time spent on this telephone encounter:  []   5-10 minutes (99441)  []   11-20 minutes (00557)  []   >21 minutes (00556)      [All the following must be checked off in order to bill for this service.]    []   This service is not related to a problem addressed by an E/M encounter in the prior 7 days  []   This service will not result in an office or telemedicine visit within the next 24 hours or soonest available appointment.   []   This service was initiated by the patient.   []   This patient is an established patient in my practice.     The patient has been notified that this visit will result in an E/M charge.    Signed: Penne LOIS Danetta, MD

## 2023-10-30 NOTE — Progress Notes
 OUTPATIENT GERIATRICS CLINIC NOTE    PATIENT:  [name]   MRN:  [id number]  DOB:  08-16-1935  DATE OF SERVICE:  10/31/2023  PRIMARY CARE PHYSICIAN: [name], MD    CHIEF COMPLAINT:   No chief complaint on file.      [facility]:  [employer]  [facility]--Neurology      [employer] [facility]:    Chaperone status:  No data recorded    HISTORY OF PRESENT ILLNESS     [name] is a 88 y.o. female who presents today for *follow up**. Patient is accompanied by *no one**. History today is per the patient,and review of available recent records in [facility] and Care Everywhere.     Patient is a(n) [x]  reliable []  unreliable historian and additional collateral information was obtained during the visit today from:         Recent abd pain.  Better with augmentin .          Per chart review, pertinent medical history:  Past Medical History:   Diagnosis Date    Asthma     Breast cancer (HCC/RAF) 12/20/2018    CHF (congestive heart failure) (HCC/RAF)     COPD (chronic obstructive pulmonary disease) (HCC/RAF)     Diabetes mellitus (HCC/RAF)     Renal disorder      No past surgical history on file.    ALLERGIES     Allergies   Allergen Reactions    Beta Adrenergic Blockers Other (See Comments)     Avoid d/t Myasthenia Gravis  Avoid d/t Myasthenia Gravis      Levofloxacin Other (See Comments)     Flare of Myasthenia Gravis    Macrolides And Ketolides Other (See Comments)     Avoid d/t Myasthenia Gravis  Use with caution d/t Myasthenia Gravis  Use with caution d/t Myasthenia Gravis      Sulfa Antibiotics Other (See Comments)     May possibly have caused deafness in one ear  May possibly have caused deafness in one ear  other      Botulinum Toxins Other (See Comments)     Muscle weakness r/t Myasthenia Gravis  Muscle weakness r/t Myasthenia Gravis      Pollen Extract Wheezing    Statins Other (See Comments)     Muscle aches  Muscle aches  Muscle aches      Plastic Tape Itching and Other (See Comments) Turns the skin red where applied        MEDICATIONS     Personally reviewed.    No outpatient medications have been marked as taking for the 10/31/23 encounter (Appointment) with [name], MD.       SOCIAL HISTORY     Social History     Social History Narrative    Not on file        FUNCTIONAL STATUS     BADLs:  IADLs:    Ambulates with   Falls in past year:  Afraid of falling:    GERIATRIC REVIEW OF SYSTEMS     Vision:    []   glasses     []  legally blind  Hearing: []  hearing aids    Nutrition: []  normal   []  impaired  []  vegan  []  vegetarian  []  low salt  []  low-carb   Swallowing: []  impaired   Dentures:   []  yes    Depression:  []  yes   Cognition:     Incontinence: [] urine  []   fecal  []  urine and fecal      ADVANCED CARE PLANNING     Advanced directives on file: []  No  []   Yes ? Completed:     Medical DPOA on file:   []   Yes ?   []   No ? Patient designates ** * to be their surrogate medical decision-maker.         HEALTH CARE MAINTENANCE     IMMUNIZATIONS:   Immunization History   Administered Date(s) Administered    COVID-19 [employer] and up) PF, 30 mcg/0.3 mL 09/24/2023    COVID-19 [employer] Fall 2023 12y and up) PF, 30 mcg/0.3 mL 03/27/2022    COVID-19, mRNA, (Pfizer - Purple Cap) 30 mcg/0.3 mL 03/11/2019, 04/01/2019, 10/29/2019    COVID-19, mRNA, bivalent (Pfizer) 30 mcg/0.3 mL (12y and up) 11/25/2020, 07/05/2021    COVID-19, mRNA, tris-sucrose (Pfizer - [employer]) 30 mcg/0.3 mL 06/20/2020    DTaP 12/31/2015    influenza vaccine IM quadrivalent (Fluzone Quad) MDV (52 months of age and older) 11/26/2016    influenza vaccine IM quadrivalent adjuvanted (FluAD Quad) (PF) SYR (20 years of age and older) 11/05/2018, 12/01/2019, 11/25/2020    influenza vaccine IM quadrivalent high dose (Fluzone High Dose Quad) (PF) SYR (23 years of age and older) 11/22/2021    influenza vaccine IM trivalent high dose (Fluzone High Dose) (PF) SYR (56 years of age and older) 12/11/2022    influenza, unspecified formulation 05/10/2014, 12/29/2015, 11/26/2016, 04/01/2019, 04/01/2019, 12/01/2019, 12/01/2019, 12/01/2019, 12/01/2019, 11/25/2020, 11/25/2020, 01/03/2022, 12/11/2022    meningococcal conjugate 4-valent (Menveo 2-vial) IM MCV4O 01/23/2023    pneumococcal conjugate vaccine 20-valent (Prevnar 20) 07/05/2021    pneumococcal polysaccharide vaccine 23-valent (Pneumovax) 01/10/2019       Mammogram:  DXA:  PAP:  Colonoscopy:      PHYSICAL EXAM     BP 120/66 (BP Location: Right arm, Patient Position: Sitting, Cuff Size: Regular)  ~ Pulse (!) 110  ~ Temp (!) 43.9 ?C (111 ?F) (Forehead)  ~ Resp 18  ~ Ht 5' 5'' (1.651 m)  ~ Wt 139 lb (63 kg)  ~ SpO2 93%  ~ BMI 23.13 kg/m?   Wt Readings from Last 3 Encounters:   10/31/23 139 lb (63 kg)   10/09/23 139 lb (63 kg)   09/24/23 137 lb (62.1 kg)         System Check if normal Positive or additional negative findings   GEN  [x]  NAD     Eyes  []  Conj/Lids []  Pupils  []  Fundi   []  Sclerae []  EOM     ENT  []  External ears   []  Otoscopy   []  Gross Hearing    []   External nose   []  Nasal mucosa   []  Lips/teeth/gums    []  Oropharynx    []  Mucus membranes      Neck  [x]  Inspection/palpation    [x]  Thyroid     Resp  [x]  Effort    [x]  Auscultation       CV  [x]  Rhythm/rate   [x]  Murmurs   [x]  Edema   []  JVP non-elevated    Normal pulses:   []  Radial []  Femoral  []  Pedal     Breast  []  Inspection []  Palpation     GI  []  Bowel sounds    []  Nontender   [x]  No distension    []  No rebound or guarding   []  No masses   []  Liver/spleen    []  Rectal  LLQ tender, no rebound   GU  F:  []  External []  vaginal wall         []  Cervix     []  mucus        []  Uterus    []  Adnexa   M:  []  Scrotum []  Penis         []  Prostate     Lymph  []  Cervical []  supraclavicular     []  Axillae   []  Groin/inguinal     MSK []  Gait  []  Back     Specify site examined:    []  Inspect/palp []  ROM   []  Stability []  Strength/tone []  Used arms to push up from seated to standing position   Assistive device:  []  single point cane []  quad cane  []  FWW []  rollator walker      Skin  []  Inspection []  Palpation     Neuro  []  Alert and oriented     []  CN2-12 intact grossly   []  DTR      []  Muscle strength      []  Sensation   []  Pronator drift   []  Finger to Nose/Heel to Shin   []  Romberg     Psych  []  Insight/judgement     []  Mood/affect    []  Gross cognition            LABS/STUDIES     LABS:  Lab Results   Component Value Date    WBC 5.52 09/24/2023    WBC 7.71 07/18/2023    WBC 6.90 07/11/2023    HGB 13.8 09/24/2023    HGB 13.5 07/18/2023    HGB 14.5 07/11/2023    MCV 97.3 09/24/2023    PLT 299 09/24/2023    PLT 335 07/18/2023    PLT 371 07/11/2023     Lab Results   Component Value Date    NA 139 09/24/2023    NA 138 08/19/2023    NA 138 08/01/2023    K 4.5 09/24/2023    K 4.3 08/19/2023    K 4.7 08/01/2023    CREAT 1.75 (H) 09/24/2023    CREAT 2.25 (H) 08/19/2023    CREAT 2.03 (H) 08/01/2023    GFRESTNOAA 29 06/09/2020    GFRESTNOAA 28 05/18/2020    GFRESTNOAA 30 03/23/2020    GFRESTAA 34 06/09/2020    GFRESTAA 33 05/18/2020    GFRESTAA 35 03/23/2020    CALCIUM  9.6 09/24/2023    CALCIUM  9.3 08/19/2023    CALCIUM  9.5 08/01/2023     Lab Results   Component Value Date    ALT 8 09/24/2023    ALT 9 07/24/2023    AST 21 09/24/2023    AST 17 07/24/2023    ALKPHOS 80 09/24/2023    BILITOT 0.2 09/24/2023    ALBUMIN 4.2 09/24/2023    ALBUMIN 4.0 07/24/2023     Lab Results   Component Value Date    TSH 1.6 09/24/2023    TSH 2.5 04/17/2023    TSH 1.5 12/11/2022     Lab Results   Component Value Date    HGBA1C 6.0 (H) 04/17/2023    HGBA1C 5.9 (H) 07/05/2022    HGBA1C 5.3 05/03/2022     Lab Results   Component Value Date    CHOL 160 12/11/2022    CHOL 205 07/05/2022    CHOL 226 01/07/2022     Lab Results   Component Value Date    CHOLDLQ 76 12/11/2022  CHOLDLQ 135 (H) 07/05/2022    CHOLDLQ 144 (H) 01/07/2022     Lab Results   Component Value Date    VITD25OH 76 10/18/2021     Lab Results   Component Value Date    FE 87 07/14/2019    FERRITIN 57 07/14/2019    FOLATE 9.6 11/25/2019    TIBC 356 07/14/2019     Lab Results   Component Value Date    VITAMINB12 3,994 (H) 11/25/2019    VITAMINB12 217 (L) 09/22/2019     Lab Results   Component Value Date    BNP 355 (H) 08/19/2023    BNP 648 (H) 08/01/2023    BNP 639 (H) 07/11/2023     No results found for: ''PSATOTAL''      STUDIES:      XR CHEST PA LAT 2V  September 22, 2019   COMPARISON: [facility] December 17, 2018     History: sob     FINDINGS:     Lungs: Clear  Heart/aorta: Normal heart size. The thoracic aorta is mildly calcified, tortuous and ectatic.  Adenopathy: None  Pleura: No effusion  Bones and Chest wall: No acute bony or body wall  findings. Osteopenia and bony maturational changes. Right upper quadrant cholecystectomy clips stable from October 2020        IMPRESSION:     No acute findings or abnormalities related to provided history.      MRI C spine  May 03, 2021  IMPRESSION:  Redemonstrated congenital cervical spinal canal stenosis with superimposed multilevel degenerative changes, which are mildly progressive compared to prior as above. No evidence of cord compression or cord signal abnormality.      Echo  Jan 2023  1. Normal left ventricular size.  2. There are LV regional wall motion abnormalities.  3. Left ventricular ejection fraction is approximately 35 to 40%.  4. Abnormal LV diastolic function.  5. There are no prior studies on this patient for comparison purposes.  ASSESSMENT and PLAN     KAYLEEANN HUXFORD is a 88 y.o. female who presents today for *follow up**.    #Diverticulitis--check CT stat.  #Chronic hypoxemic respiratory failure--oxygen dependent.    #Major Depression--active--self d/ced duloxetine .  Rechallenge.  Tolerating Duloxetine . Maintaining clonazepam  given severe anxiety now.  #Myesthenia Gravis--not worse. On  ULTOMIRIS .  #HFrEF--EF 20%noted on echo-- 2023--Worse.  Chronic.  Will monitor and treat underlying risk factors with medical and lifestyle interventions as warranted by balance between benefits and burdens.  Biventricular syncronized pacer 100% consistent with goals of care. Will connect with EP and encourage moving forward.  #CAD--discussed medical management alone with shared decision making model May 11, 2021.  Declines staint  #Cervical myelopathy--monitored by neuro--chronic--re-imaged on MRI as above.  CTM.  #HTN--Increased nifedipine  CR 60 mg daily. Continue losartan  50 mg.  BP Better.    #CKD--stage 4--chronic--CTM--control BP as tolerated by symptoms of orthostasis and fatigue.  Trial off trimethoprim .  REcheck Cr now and in two weeks.  #CAD--patient leaning towards medical management.  #Aortic Calcification--noted on chest imaging September 22, 2019--Not Worsening.  Chronic.  Will monitor and treat underlying risk factors with medical and lifestyle interventions as warranted by balance between benefits and burdens.  #Back pain--to refer to Pain for discussion of epidural injection.  #allergic rhinitis--trial flonase   #Macrocytosis--  #Deconditioning--initiate home PT  #GERD--s/p Nisan fundoplication  #COPD--mixed--per PFT's from [place] .--albuterol  prn.  May resume other inhalders.  #s/p rectocele repair  #s/p cholecystectomy  #Immunosupressed status--medication  related.  Chronic. CTM  #Nocturnal hypoxemia--presumably related to MG crisis but has history of COPD.  Will order nocturnal home O2 study and consider PFT's/Pulmonary evaluation once settled down.  May need to restart BREO inhaler.  #Myasthenia Gravis--s/p plasmapheresis x5 October 2020, no Thymoma, on Azathioprine  150 daily and mestinon  60 tid.  Saw Dr. Montell at [facility].  Was considering Soliris.  Ordering IVIG and MRI.  Encourage ongoing neurology follow up.   #IBS--titrate miralax to comfort.    #Interstitial cystitis--stopping daily Trimethoprim  100 for UTI prophylaxis.  Using bladder instillations prn. OFF topical estrogen due to advice from neurologist re: MG.  To see Dr. Buena.  Suspect recent flare related to breast CA re-diagnosis.  Doubt pseudomonas bactiuria is pathogen at present.  WIll complete current meropenum.  #Allergic rhinitis--previously on nasal steroids.  #Breast CA--under care.  Patient continues to decline surgery.  Wondering about additional treatment options.  Will direct to med-onc for discussion.  #DM with CKD--stabe 3b--based on labs from [place] .  FOllow here.  If BP permits, may consider ARB BUT patient with h/o angioedema so will ONLY initiate this agent with great caution.  Refer to Ophthalmology.  #OA knees--previously getting hyaluronic acid injections.  #Anxiety  #Benzodiazepine Dependence--uncomplicated--chronic.  Encourage patient to decrease intake--ideally to abstaining but will work towards this goal over time.  Not likely to achieve this given ongoing concerns re: breast ca.  #Fibromyalgia--duloxetine  as above.   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  #Statin intolerance      FOLLOW-UP     RTC     Future Appointments   Date Time Provider Department Center   10/31/2023  3:40 PM [name], MD GERI IMS [address]/Cen   11/13/2023  3:30 PM Daryel Cornet, MD [facility] [address] /Cen   11/19/2023  2:00 PM Tabibiazar, Ramin, MD CRD DIS [address] [facility]         The above plan of care, diagnosis, orders, and follow-up were discussed with the patient and/or surrogate. Questions related to this recommended plan of care were answered.    ANALYSIS OF DATA (Needs to meet 1 category for moderate and 2 categories for high LOS)     I have:     Category 1 (Needs 3 for moderate and high LOS)     []  Reviewed []  1 []  2 []  >= 3 unique laboratory, radiology, and/or diagnostic tests noted below    Test/Study:  on date .    []  Reviewed []  1 []  2 []  >= 3 prior external notes and incorporated into patient assessment    I reviewed Dr. 's note in specialty  from date .    []  Discussed management or test interpretation with external provider(s) as noted      []  Ordered []  1 []  2 []  >= 3 unique laboratory, radiology, and/or diagnostic tests noted in A&P    []  Obtained history from independent historian:       Category 2  []  Independently interpreted the test    Category 3  []  Discussed management or test interpretation with external provider(s) as noted      INTERACTION COMPLEXITY and SOCIAL DETERMINANTS of HEALTH       PROBLEM COMPLEXITY   []  New problem with uncertain diagnosis or prognosis (moderate)   []  Multiple stable chronic problems (moderate)   []  Chronic problem not stable - not controlled, symptomatic, or worsening (moderate)   []  Severe exacerbation of chronic problem (high)   []  New or chronic  problem that poses threat to life or bodily function (high)     MANAGEMENT COMPLEXITY   []  Old or external/outside records reviewed   []  Discussion with alternate (proxy) if patient with impaired communication / comprehension ability (e.g., dementia, aphasia, severe hearing loss).   []  Repeated questions (or disagreement) between patient and/among caregivers/family during the visit.  []  Caregiver/patient emotions/behavior/beliefs interfering with implementation of treatment plan.  []  Independent interpretation of test (EKG, Chest XRay)   []  Discussion of case with a consultant physician     RISK LEVEL   []  Prescription drug management (moderate)   []  Minor surgery with CV risk factors or elective major surgery (moderate)   []  Dx or Rx significantly limited by SDoH (inadequate housing, living alone, poor health care access, inappropriate diet; low literacy) (moderate)   []  Major surgery - elective with CV risk factors or emergent (high)   []  Need for hospitalization (high)   []  New DNR or de-escalation of care (high)      SDoH  The diagnosis or treatment of said conditions is significantly limited by the following social determinants of health:  []  Z59.0 Homelessness  []  Z59.1 Inadequate housing  []  Z59.2 Discord with neighbors, lodgers and landlord  []  Z60.2 Problems related to living alone  []  Z59.8 Other problems related to housing and economic circumstances  []  Z59.4 Lack of adequate food and safe drinking water  []  Z59.6 Low income  []  Z59.7 Insufficient social insurance and welfare support  []  Z59.9 Problems related to housing and economic circumstances, unspecified  []  Z75.3 Unavailability and inaccessibility of health care facilities  []  Z75.4 Unavailability and inaccessibility of other helping agencies  []  Z72.4 Inappropriate diet and eating habits  []  Z62.820 Parent-biological child conflict  []  Z63.8 Other specified problems related to primary support group  []  Z55.0 Illiteracy and low level literacy   []  Z56.9 Unspecified problems related to employment        If Billing Based on Time:     I performed the following items on the day of service:    [x]  Preparing to see the patient (e.g., review of tests)  [x]  Obtaining and/or reviewing separately obtained history   [x]  Performing a medically appropriate examination and/or evaluation   [x]  Counseling and educating the patient/family/caregiver   [x]  Ordering medications, tests, or procedures  [x]  Referring and communicating with other healthcare professionals (when not separately reported)  [x]  Documenting clinical information in the EHR  []  Independently interpreting results and communicating results to patient/family/caregiver    I spent the following total amount of time on these tasks on the day of service:  New Patient     Established Patient  []  15-29 minutes - 99202    []  up to 9 minutes - 99211  []  30-44 minutes - 99203     []  10-19 minutes - 99212   []  45-59 minutes - 99204      []  20-29 minutes - 99213   []  60-74 minutes - 99205   [x]  30- minutes - 99214         []  40-55 minutes - 99215    []  I spent an additional ** * 15-minute-increment(s) for a total of * ** minutes on these tasks on the day of service. [phone] for each additional 15 minutes.)    Author: Penne LOIS Quaker, MD 10/31/2023 Quaker, MD 10/31/2023          Device  CT abd/pelvis  Follow up

## 2023-10-31 ENCOUNTER — Ambulatory Visit: Payer: Commercial Managed Care - Pharmacy Benefit Manager | Attending: Geriatric Medicine

## 2023-10-31 DIAGNOSIS — R000 Tachycardia, unspecified: Principal | ICD-10-CM

## 2023-10-31 DIAGNOSIS — K5792 Diverticulitis of intestine, part unspecified, without perforation or abscess without bleeding: Secondary | ICD-10-CM

## 2023-10-31 DIAGNOSIS — I5020 Unspecified systolic (congestive) heart failure: Secondary | ICD-10-CM

## 2023-11-01 DIAGNOSIS — K5792 Diverticulitis of intestine, part unspecified, without perforation or abscess without bleeding: Secondary | ICD-10-CM

## 2023-11-01 LAB — Comprehensive Metabolic Panel
SODIUM: 141 mmol/L (ref 135–146)
TOTAL PROTEIN: 7.2 g/dL (ref 6.1–8.2)

## 2023-11-01 LAB — B-Type Natriuretic Peptide: BNP: 498 pg/mL — ABNORMAL HIGH (ref <100)

## 2023-11-01 LAB — CBC: HEMATOCRIT: 40.9 % (ref 34.9–45.2)

## 2023-11-02 ENCOUNTER — Inpatient Hospital Stay: Payer: PRIVATE HEALTH INSURANCE | Attending: Geriatric Medicine

## 2023-11-02 MED ADMIN — IOHEXOL 350 MG/ML IV SOLN: 100 mL | INTRAVENOUS | @ 02:00:00 | Stop: 2023-11-02

## 2023-11-13 ENCOUNTER — Ambulatory Visit: Payer: PRIVATE HEALTH INSURANCE | Attending: Student in an Organized Health Care Education/Training Program

## 2023-11-19 ENCOUNTER — Inpatient Hospital Stay: Payer: Commercial Managed Care - Pharmacy Benefit Manager

## 2023-11-19 ENCOUNTER — Ambulatory Visit: Payer: BLUE CROSS/BLUE SHIELD

## 2023-11-19 ENCOUNTER — Other Ambulatory Visit: Payer: PRIVATE HEALTH INSURANCE

## 2023-11-20 DIAGNOSIS — I5022 Chronic systolic (congestive) heart failure: Secondary | ICD-10-CM

## 2023-11-21 ENCOUNTER — Telehealth: Payer: Commercial Managed Care - Pharmacy Benefit Manager

## 2023-11-21 NOTE — Telephone Encounter
 Call Back Request      Reason for call back: Per [facility], following up to form faxed on 9/22 regarding replacement for oxygen, pending MD's signature.  F: [phone]  Any Symptoms:  []  Yes  [x]  No      If yes, what symptoms are you experiencing:    Duration of symptoms (how long):    Have you taken medication for symptoms (OTC or Rx):      If call was taken outside of clinic hours:    [] Patient or caller has been notified that this message was sent outside of normal clinic hours.     [] Patient or caller has been warm transferred to the physician's answering service. If applicable, patient or caller informed to please call us  back if symptoms progress.  Patient or caller has been notified of the turnaround time of 1-2 business day(s).

## 2023-11-22 NOTE — Telephone Encounter
 Pt has been scheduled.

## 2023-11-26 ENCOUNTER — Telehealth: Payer: Commercial Managed Care - Pharmacy Benefit Manager

## 2023-11-26 NOTE — Telephone Encounter
 Call Back Request      Reason for call back: Per Marissa, [facility], pt is refusing her morning medicaitons, only taking evening medeications. Pt would ask to leave at bedside and when they go back it is still there.     P: [phone]      Any Symptoms:  []  Yes  [x]  No      If yes, what symptoms are you experiencing:    Duration of symptoms (how long):    Have you taken medication for symptoms (OTC or Rx):      If call was taken outside of clinic hours:    [] Patient or caller has been notified that this message was sent outside of normal clinic hours.     [] Patient or caller has been warm transferred to the physician's answering service. If applicable, patient or caller informed to please call us  back if symptoms progress.  Patient or caller has been notified of the turnaround time of 1-2 business day(s).

## 2023-11-29 ENCOUNTER — Other Ambulatory Visit: Payer: PRIVATE HEALTH INSURANCE

## 2023-12-02 ENCOUNTER — Telehealth: Payer: Commercial Managed Care - Pharmacy Benefit Manager

## 2023-12-02 ENCOUNTER — Telehealth: Payer: MEDICARE

## 2023-12-02 NOTE — Telephone Encounter
Placed in md folder

## 2023-12-02 NOTE — Telephone Encounter
 Tried to reach patient LVM to schedule F/U with Dr Ely   [facility] please schedule when patient calls back

## 2023-12-02 NOTE — Telephone Encounter
 Call Back Request      Reason for call back: Pt returning [place] call. Pt had some concerns.     Cbn: [phone]    Any Symptoms:  []  Yes  [x]  No      If yes, what symptoms are you experiencing:    Duration of symptoms (how long):    Have you taken medication for symptoms (OTC or Rx):      If call was taken outside of clinic hours:    [] Patient or caller has been notified that this message was sent outside of normal clinic hours.     [] Patient or caller has been warm transferred to the physician's answering service. If applicable, patient or caller informed to please call us  back if symptoms progress.  Patient or caller has been notified of the turnaround time of 1-2 business day(s).

## 2023-12-03 ENCOUNTER — Ambulatory Visit: Payer: BLUE CROSS/BLUE SHIELD

## 2023-12-03 DIAGNOSIS — I700 Atherosclerosis of aorta: Secondary | ICD-10-CM

## 2023-12-03 DIAGNOSIS — E785 Hyperlipidemia, unspecified: Secondary | ICD-10-CM

## 2023-12-03 DIAGNOSIS — I5022 Chronic systolic (congestive) heart failure: Principal | ICD-10-CM

## 2023-12-03 DIAGNOSIS — I10 Essential (primary) hypertension: Secondary | ICD-10-CM

## 2023-12-03 MED ORDER — METOPROLOL SUCCINATE ER 25 MG PO TB24
25 mg | ORAL_TABLET | Freq: Every day | ORAL | 3 refills | 30.00000 days | Status: AC
Start: 2023-12-03 — End: ?

## 2023-12-03 NOTE — Telephone Encounter
 PT came into [address] booked with Dr Ely in February per pt request declined sooner appt

## 2023-12-03 NOTE — Consults
 CARDIOLOGY NOTE      PATIENT: [name]   MRN: [id number]   DOB: 09/11/1935   DATE OF SERVICE: 12/03/2023    PCP:  [name], MD      Problem List Items Addressed This Visit    None  Visit Diagnoses         Systolic CHF, chronic (HCC/RAF)    -  Primary      Atherosclerosis of aorta          Dyslipidemia          Essential hypertension                    Interval History:     12/03/2023:   No acute cardiac complaints    09/2023:  With MG, she gets drooping in eyelids and weakness in the legs     09/04/2023:   Pt with dyspnea and chest pain with exertion  She is taking Lasix  on daily basis        Daryel Cornet, MD  Audery Wassenaar, MD; [name], MD  Hi,    Sorry for the late reply. I'm okay with started a beta blocker from myasthenia gravis perspective. The risk of exacerbation in this case is very small. Different classes of beta blockers are not studied as far as I know, so I do not have any preference. Lower dose is likely safer. Please also keep in mind that she has COPD.    Best,  Yigit          Previous Messages       ----- Message -----  From: Flemon Kelty, MD  Sent: 08/08/2023  11:53 AM PDT  To: Penne LOIS Danetta, MD; Cornet Daryel, MD    Hi,  Pt with heart failure and reduced LV EF.  Do you have a concern for chronic use of BBlockers? Any preference for Metoprolol  or Coreg?    BBlockrs are listed in her Allergies: Avoid d/t Myasthenia Gravis        Thanks,  Rt       07/25/2023:  A/w daughter  Recent hospitalization for dyspnea.   She was started on Lasix . She was taking up to 3 tabs per day  She last took Lasix  prior to angiogram  Uses a walker   Pt notes chest tightness and dyspnea    08/2022:  The patient feels SOB/dyspnea and tired  She notes chest pain and fibromyalgia   Ambulates with a walker  ET: 1/2 block, stable     Prior Encounter:     This is a 88 y.o. female who is here for cardiac evaluation.  Indication:   SOB    The patient is a/w friend, Winston    Pt was recently hospitalized with SOB and chest pain.   Pt ambulates with a walker for precautionary reasons given history of falls.   With activity, the patient reports SOB/dypnea. She walks the dog 1/2 block  Her symptoms have been ongoing for the past 3 years, progressive worsening.   The patient notes allergies and asthma.   About 1 year ago, she could walk 1.5 blocks         Past Medical History:   Diagnosis Date    Asthma     Breast cancer (HCC/RAF) 12/20/2018    CHF (congestive heart failure) (HCC/RAF)     COPD (chronic obstructive pulmonary disease) (HCC/RAF)     Diabetes mellitus (HCC/RAF)     Renal disorder  No past surgical history on file.    Allergies:   Allergies   Allergen Reactions    Beta Adrenergic Blockers Other (See Comments)     Avoid d/t Myasthenia Gravis  Avoid d/t Myasthenia Gravis      Levofloxacin Other (See Comments)     Flare of Myasthenia Gravis    Macrolides And Ketolides Other (See Comments)     Avoid d/t Myasthenia Gravis  Use with caution d/t Myasthenia Gravis  Use with caution d/t Myasthenia Gravis      Sulfa Antibiotics Other (See Comments)     May possibly have caused deafness in one ear  May possibly have caused deafness in one ear  other      Botulinum Toxins Other (See Comments)     Muscle weakness r/t Myasthenia Gravis  Muscle weakness r/t Myasthenia Gravis      Pollen Extract Wheezing    Statins Other (See Comments)     Muscle aches  Muscle aches  Muscle aches      Plastic Tape Itching and Other (See Comments)     Turns the skin red where applied       Social History     Socioeconomic History    Marital status: Widowed   Tobacco Use    Smoking status: Former     Current packs/day: 0.00     Types: Cigarettes     Quit date: 1985     Years since quitting: 40.7    Smokeless tobacco: Former   Substance and Sexual Activity    Alcohol use: Never    Drug use: Never    Sexual activity: Not Currently     Social Drivers of Health     Financial Resource Strain: Low Risk (06/12/2023)    Financial Resource Strain     Difficulty of Paying Living Expenses: Not hard at all       No family history on file.  Adopted     Objective:     BP 125/78  ~ Pulse (!) 105  ~ Ht 5' 5'' (1.651 m)  ~ Wt 137 lb 3.2 oz (62.2 kg)  ~ SpO2 93%  ~ BMI 22.83 kg/m?     Vitals:    12/03/23 1446   BP: 125/78   Pulse: (!) 105   SpO2: 93%   Weight: 137 lb 3.2 oz (62.2 kg)   Height: 5' 5'' (1.651 m)       BP Readings from Last 3 Encounters:   12/03/23 125/78   10/31/23 120/66   10/17/23 128/74       Body mass index is 22.83 kg/m?SABRA    Wt Readings from Last 3 Encounters:   12/03/23 137 lb 3.2 oz (62.2 kg)   10/31/23 139 lb (63 kg)   10/09/23 139 lb (63 kg)          General Exam: no apparent distress.    Neurologic/Psychiatric: alert and oriented. Affect is appropriate.  HEENT: Sclerae are anicteric. There is no epistaxis.  Cardiac: regular in rate and rhythm.    Respiratory: non-labored breathing. No use of accessory muscles. No wheeze.  Abdomen: bowel sounds are present. Abdomen is soft. No rebound.    Extremities: no edema. No cyanosis or clubbing.    Integument: no petechiae. No acute rash.         Laboratory:  Lab Results   Component Value Date    NA 141 10/31/2023    K 4.2 10/31/2023    CL 102 10/31/2023  CO2 25 10/31/2023    BUN 27 (H) 10/31/2023    CREAT 1.34 (H) 10/31/2023    GLUCOSE 86 10/31/2023    CALCIUM  9.5 10/31/2023     Lab Results   Component Value Date    CKTOT 51 12/11/2022    CKTOT 75 07/05/2022    CKTOT 54 01/07/2022    TROPONIN 13 (H) 06/11/2023    TROPONIN 32 (H) 06/11/2023    TROPONIN 0.05 (H) 12/19/2020    TROPONIN 0.07 (H) 12/19/2020    TROPONIN 0.06 (H) 12/18/2020     Lab Results   Component Value Date    BNP 498 (H) 10/31/2023     Lab Results   Component Value Date    WBC 5.94 10/31/2023    HGB 13.1 10/31/2023    HCT 40.9 10/31/2023    MCV 99.3 (H) 10/31/2023    PLT 279 10/31/2023     Lab Results   Component Value Date    CHOL 160 12/11/2022    CHOLHDL 55 12/11/2022    CHOLDLQ 76 12/11/2022    TRIGLY 181 (H) 12/11/2022     Lab Results   Component Value Date    PT 12.5 07/18/2023    INR 0.9 07/18/2023     No components found for: ''HA1C''  Lab Results   Component Value Date    TSH 1.6 09/24/2023       Patient Active Problem List   Diagnosis    Acute medial meniscal tear, left, subsequent encounter    Angioedema    Anxiety    Asthmatic bronchitis, mild persistent, with acute exacerbation    Chronic cholecystitis    Chronic interstitial cystitis    CKD (chronic kidney disease), stage III (HCC/RAF)    COPD mixed type (HCC/RAF)    Major depression, recurrent, chronic    Diabetes mellitus (HCC/RAF)    Esophageal reflux    Eustachian tube dysfunction    Fibromyalgia    H/O arthroscopy    Insomnia    Myasthenia gravis with exacerbation (HCC/RAF)    Nocturnal hypoxemia    Osteoarthritis of knee    Renal cyst    Seasonal and perennial allergic rhinitis    Spondylosis without myelopathy or radiculopathy, lumbar region    Urethral caruncle    Vaginal atrophy    Benzodiazepine dependence (HCC/RAF)    Breast cancer (HCC/RAF)    Refractive error    Ptosis of eyelid    Pseudophakia of both eyes    Aortic calcification    HTN (hypertension)    Immunosuppressed status    Acute chest pain    Atypical chest pain    Heart failure with reduced ejection fraction LVEF <=40% (HCC/RAF)    Epiretinal membrane, bilateral    PVD (posterior vitreous detachment), bilateral    Chorioretinal scars, bilateral    Hyperkalemia       Outpatient Medications Prior to Visit   Medication Sig    ALBUTEROL  90 mcg/act inhaler INHALE 1-2 PUFF BY MOUTH EVERY 4 HOURS AS NEEDED FOR WHEEZING OR SHORTNESS OF BREATH    aspirin  81 mg EC tablet Take 1 tablet (81 mg total) by mouth daily.    azaTHIOprine  50 mg tablet Take 1.5 tablets (75 mg total) by mouth daily.    BUPROPION , SR, 200 mg 12 hr tablet TAKE 1 TAB BY MOUTH TWICE DAILY (ANTIDEPRESSANT)    clonazePAM  1 mg tablet TAKE 1 AND HALF TABLET BY MOUTH AT NIGHT    dapagliflozin  (FARXIGA ) 10 mg tablet Take  1 tablet (10 mg total) by mouth daily.    DEXILANT  60 MG DR capsule Take 1 capsule (60 mg total) by mouth daily.    DULOXETINE  20 mg DR capsule TAKE 3 CAPSULES (60 MG TOTAL) BY MOUTH DAILY    EPINEPHrine  0.3 mg/0.3 mL auto-injector INJECT 0.3ML (0.3MG ) SUBCUTANEOUSLY AS NEEDED FOR ANAPHYLAXIS (ACUTE AND CHRONIC RESPIRATORY FAILURE WITH HYPOXIA)    exemestane  25 mg tablet Take 1 tablet (25 mg total) by mouth daily.    fluticasone -vilanterol 100-25 mcg/act inhaler INHALE 1 PUFF BY MOUTH EVERY DAY (ACUTE AND CHRONIC RESPIRATORY FAILURE WITH HYPOXIA)    furosemide  20 mg tablet Take 1 tablet (20 mg total) by mouth daily.    LOSARTAN  100 mg tablet TAKE 1 TAB BY MOUTH EVERY DAY (HEART FAILURE, UNSPECIFIED)    metoprolol  succinate 25 mg 24 hr tablet Take 1 tablet (25 mg total) by mouth daily.    MONTELUKAST  10 mg tablet TAKE 1 TAB BY MOUTH EVERY EVENING (ACUTE AND CHRONIC RESPIRATORY FAYLURE WITH HYPOXIA)    PENICILLIN  V POTASSIUM 500 mg tablet TAKE 1 TAB BY MOUTH TWICE DAILY (BACTERIAL INFECTION)    PYRIDOSTIGMINE  60 mg tablet TAKE 1 TAB BY MOUTH THREE TIMES DAILY (MYASTHENIA GRAVIS)    REPATHA  SURECLICK 140 MG/ML SOAJ INJECT 1 PEN UNDER THE SKIN EVERY 14 DAYS (HEART FAILURE, UNSPECIFIED)    sodium chloride  0.9% IV soln     tiotropium 18 mcg/act inhalation capsule Place 1 capsule (18 mcg total) into inhaler and inhale daily.    ULTOMIRIS 300 MG/3ML injection     ULTOMIRIS 1100 MG/11ML SOLN      No facility-administered medications prior to visit.       Current Outpatient Medications   Medication Sig    ALBUTEROL  90 mcg/act inhaler INHALE 1-2 PUFF BY MOUTH EVERY 4 HOURS AS NEEDED FOR WHEEZING OR SHORTNESS OF BREATH    aspirin  81 mg EC tablet Take 1 tablet (81 mg total) by mouth daily.    azaTHIOprine  50 mg tablet Take 1.5 tablets (75 mg total) by mouth daily.    BUPROPION , SR, 200 mg 12 hr tablet TAKE 1 TAB BY MOUTH TWICE DAILY (ANTIDEPRESSANT)    clonazePAM  1 mg tablet TAKE 1 AND HALF TABLET BY MOUTH AT NIGHT    dapagliflozin  (FARXIGA ) 10 mg tablet Take 1 tablet (10 mg total) by mouth daily.    DEXILANT  60 MG DR capsule Take 1 capsule (60 mg total) by mouth daily.    DULOXETINE  20 mg DR capsule TAKE 3 CAPSULES (60 MG TOTAL) BY MOUTH DAILY    EPINEPHrine  0.3 mg/0.3 mL auto-injector INJECT 0.3ML (0.3MG ) SUBCUTANEOUSLY AS NEEDED FOR ANAPHYLAXIS (ACUTE AND CHRONIC RESPIRATORY FAILURE WITH HYPOXIA)    exemestane  25 mg tablet Take 1 tablet (25 mg total) by mouth daily.    fluticasone -vilanterol 100-25 mcg/act inhaler INHALE 1 PUFF BY MOUTH EVERY DAY (ACUTE AND CHRONIC RESPIRATORY FAILURE WITH HYPOXIA)    furosemide  20 mg tablet Take 1 tablet (20 mg total) by mouth daily.    LOSARTAN  100 mg tablet TAKE 1 TAB BY MOUTH EVERY DAY (HEART FAILURE, UNSPECIFIED)    metoprolol  succinate 25 mg 24 hr tablet Take 1 tablet (25 mg total) by mouth daily.    MONTELUKAST  10 mg tablet TAKE 1 TAB BY MOUTH EVERY EVENING (ACUTE AND CHRONIC RESPIRATORY FAYLURE WITH HYPOXIA)    PENICILLIN  V POTASSIUM 500 mg tablet TAKE 1 TAB BY MOUTH TWICE DAILY (BACTERIAL INFECTION)    PYRIDOSTIGMINE  60 mg tablet TAKE 1 TAB BY  MOUTH THREE TIMES DAILY (MYASTHENIA GRAVIS)    REPATHA  SURECLICK 140 MG/ML SOAJ INJECT 1 PEN UNDER THE SKIN EVERY 14 DAYS (HEART FAILURE, UNSPECIFIED)    sodium chloride  0.9% IV soln     tiotropium 18 mcg/act inhalation capsule Place 1 capsule (18 mcg total) into inhaler and inhale daily.    ULTOMIRIS 300 MG/3ML injection     metoprolol  succinate 25 mg 24 hr tablet Take 1 tablet (25 mg total) by mouth daily.    ULTOMIRIS 1100 MG/11ML SOLN      No current facility-administered medications for this visit.             ACC/AHA Cholesterol Management Guideline Recommendations:   To continue statin treatment, as indicated based on pre-treatment risk assessment.      ASCVD Risk Score    10-year ASCVD risk  N/A. Age > 75. as of 2:59 PM on 12/03/2023.   10-year ASCVD risk with optimal risk factors  cannot be calculated.   Values used to calculate ASCVD Risk Score    Age  88 y.o. Cannot calculate risk because age is not between 73 and 24 years old.   Gender  female   Race  White   HDL Cholesterol  55 mg/dL. (measured on 12/11/2022)   LDL Cholesterol  76 mg/dL. (measured on 12/11/2022)   Total Cholesterol  160 mg/dL. (measured on 12/11/2022)   Systolic Blood Pressure  125 mm Hg. (measured on 12/03/2023)   Blood Pressure Medication Present  Yes   Smoking Status  currently not a smoker   Diabetes Present  Yes     Click here for the [facility] ASCVD Cardiovascular Risk Estimator Plus tool Office manager).           Assessment:     SHEQUITA PEPLINSKI is 88 y.o. female with    Non-ischemic Cardiomyopathy, EF 35-40% Echo in 2023, down to 22% Echo 05/2023  - avoided BBlockers due to myasthenia gravis  - cardiac cath with no CAD in May 2025  - discussed screening of 1st degree relatives with pt and daughter. Pt is adopted. 2 kids    Abnormal Myoview  in 2023: There is a moderate-severe perfusion defect in the distal anteroseptal wall and apex, suggestive for MI.    SOB/dyspnea. Uses oxygen concentrator  Abnormal PFTs, h/o COPD   Abnormal EKG with LBBB and PACs  Former tobacco use  Subtle calcifications of the aortic root. Moderately calcified thoracic aorta and its main branch vessels.  Intolerant to Statins  Frail and deconditioned     Plan:     Losartan  100 mg  DC'd ASA    Repatha  - discussed compliance   Farxiga   DC'd Aldactone  given high K and AKI  Seen by Dr. Myrle in EP with plans for CRT   Started low dose MTP    Orders Placed This Encounter    ECG 12-Lead [facility] Performed    Echo adult transthoracic complete    metoprolol  succinate 25 mg 24 hr tablet           Return in about 2 months (around 02/02/2024).        GDMT for Heart Failure with Reduced Ejection Fraction   Most recent LVEF: 22%, HFrEF present on problem list: Yes.   For the most accurate recommendations, please ensure that a LVEF is present and HFrEF is added to the problem list, if applicable      GDMT Medication Class Current Prescription Recommendation   Specified Beta Blockers^ Metoprolol  Patient  has a listed allergy to beta blockers   ACEi, ARB, ARNi^^ Losartan  Patient is on ARNI (sacubitril-valsartan). Use with caution in patients with hyperkalemia and renail impairment.   MRA No MRA found Patient has a current contraindication or exclusion for MRA. Use with caution in patients with hyperkalemia and renal impairment   SGLT2i Dapagliflozin  Patient is on guideline recommended SGLT2i   ^ Specified beta blockers: metoprolol  succinate, carvedilol, bisoprolol   EVELENE ARNI (sacubitril/valsartan) preferred. Criteria for ARNI initiation: Discontinuation of ACEI for at least 36 hours, eGFR>=30, K<=5.2, no history of angioedema   Click here for ACC / AHA HFrEF GDMT recommendations         Patient Instructions   For your next clinic visit, please bring the list of your medications as we discussed    No future appointments.        Author: Raleigh Jon, MD

## 2023-12-03 NOTE — Patient Instructions
 For your next clinic visit, please bring the list of your medications as we discussed

## 2023-12-03 NOTE — Telephone Encounter
 Tried to reach patient LVM to reschedule F/U with Dr Lestine to covering MD Dr Ely due to Dr Lestine surgery  [facility] ok to schedule with Dr Ely

## 2023-12-18 ENCOUNTER — Other Ambulatory Visit: Payer: Commercial Managed Care - Pharmacy Benefit Manager

## 2023-12-19 ENCOUNTER — Other Ambulatory Visit: Payer: MEDICARE

## 2023-12-27 ENCOUNTER — Telehealth: Payer: BLUE CROSS/BLUE SHIELD

## 2023-12-27 NOTE — Telephone Encounter
 ALF Note:    Pt  has been refusing to take Farmiga claiming that it causes her to have diarrhea and to throw up    [facility]  F [phone]

## 2023-12-31 ENCOUNTER — Other Ambulatory Visit: Payer: BLUE CROSS/BLUE SHIELD

## 2024-01-02 ENCOUNTER — Ambulatory Visit: Payer: Commercial Managed Care - Pharmacy Benefit Manager | Attending: Geriatric Medicine

## 2024-01-13 ENCOUNTER — Other Ambulatory Visit: Payer: PRIVATE HEALTH INSURANCE

## 2024-01-13 DIAGNOSIS — C50812 Malignant neoplasm of overlapping sites of left female breast: Secondary | ICD-10-CM

## 2024-01-13 DIAGNOSIS — Z170 Estrogen receptor positive status [ER+]: Secondary | ICD-10-CM

## 2024-01-13 DIAGNOSIS — C50811 Malignant neoplasm of overlapping sites of right female breast: Secondary | ICD-10-CM

## 2024-01-13 NOTE — Telephone Encounter
 Order was faxed to ALF.    --Increase lasix  to 40 mg daily for three days    [facility]  P: [phone]  F: [phone]  ___________________________________________________    Order for portable oxygen was faxed to pt's current oxygen supplier.    [facility]   (P) [phone]   [phone]

## 2024-01-14 ENCOUNTER — Ambulatory Visit: Payer: BLUE CROSS/BLUE SHIELD | Attending: Geriatric Medicine

## 2024-01-14 ENCOUNTER — Telehealth: Payer: Commercial Managed Care - Pharmacy Benefit Manager

## 2024-01-14 NOTE — Telephone Encounter
 Message to Practice/Provider      Message: [name]  with [facility] will be faxing over RX Form for the Portable Oxygen Concentrator for [name]  to Complete and fax back to [phone].  Thank You     Return call is not being requested by the patient or caller.    Patient or caller has been notified that their message will be reviewed within 1-2 business days.

## 2024-01-14 NOTE — Telephone Encounter
 Noted. Will keep an eye out for order request from [facility].

## 2024-01-15 ENCOUNTER — Other Ambulatory Visit: Payer: MEDICARE

## 2024-01-15 NOTE — Telephone Encounter
 Spoke to pt she provided more info regarding her mychart message below. Patient's been struggling with her breathing (not a new issue); she wanted [name] to have an idea of what her pulse ox readings are looking like. She states, generally when she is resting her pulse ox reads at around 95; however, when she gets up and walks around it declines significantly. This morning after after walking around it read 82 and once she sat down it went back up to 95. Patient would like further recommendations from Dr. Zygmunt family has an upcoming trip on 12/22 and would like to know if [name] believes she will be ok to go.    Patient verbalized, she is not experiencing an urgent or emergent situation and ok to wait for [name]' response.    Patient is aware we initiated the order for a portable oxygen.

## 2024-01-16 ENCOUNTER — Telehealth: Payer: BLUE CROSS/BLUE SHIELD

## 2024-01-16 NOTE — Telephone Encounter
 Spoke to pt--I advised, [name] was able to accommodate a sooner appointment. She is now scheduled for 11/25 @ 12:30 pm. Patient will communicate with her daughter as she is provides transportation.

## 2024-01-20 DIAGNOSIS — Z0000 Encounter for general adult medical examination without abnormal findings: Principal | ICD-10-CM

## 2024-01-20 NOTE — Progress Notes
 OUTPATIENT GERIATRICS CLINIC NOTE    PATIENT:  [name]   MRN:  [id number]  DOB:  07-06-1935  DATE OF SERVICE:  01/21/2024  PRIMARY CARE PHYSICIAN: Kaitlyn Penne POUR., MD    CHIEF COMPLAINT:   No chief complaint on file.      [place] [facility]:  [facility]--Neurology      [facility]:    Chaperone status:  No data recorded    HISTORY OF PRESENT ILLNESS     [name] is a 88 y.o. female who presents today for *follow up**. Patient is accompanied by *no one**. History today is per the patient,and review of available recent records in [facility] and Care Everywhere.     Patient is a(n) [x]  reliable []  unreliable historian and additional collateral information was obtained during the visit today from:       More SOB.            Per chart review, pertinent medical history:  Past Medical History:   Diagnosis Date    Asthma     Breast cancer (HCC/RAF) 12/20/2018    CHF (congestive heart failure) (HCC/RAF)     COPD (chronic obstructive pulmonary disease) (HCC/RAF)     Diabetes mellitus (HCC/RAF)     Renal disorder      No past surgical history on file.    ALLERGIES     Allergies   Allergen Reactions    Beta Adrenergic Blockers Other (See Comments)     Avoid d/t Myasthenia Gravis  Avoid d/t Myasthenia Gravis      Levofloxacin Other (See Comments)     Flare of Myasthenia Gravis    Macrolides And Ketolides Other (See Comments)     Avoid d/t Myasthenia Gravis  Use with caution d/t Myasthenia Gravis  Use with caution d/t Myasthenia Gravis      Sulfa Antibiotics Other (See Comments)     May possibly have caused deafness in one ear  May possibly have caused deafness in one ear  other      Botulinum Toxins Other (See Comments)     Muscle weakness r/t Myasthenia Gravis  Muscle weakness r/t Myasthenia Gravis      Pollen Extract Wheezing    Statins Other (See Comments)     Muscle aches  Muscle aches  Muscle aches      Plastic Tape Itching and Other (See Comments)     Turns the skin red where applied MEDICATIONS     Personally reviewed.    No outpatient medications have been marked as taking for the 01/21/24 encounter (Appointment) with Kaitlyn Penne POUR., MD.       SOCIAL HISTORY     Social History     Social History Narrative    Not on file        FUNCTIONAL STATUS     BADLs:  IADLs:    Ambulates with   Falls in past year:  Afraid of falling:    GERIATRIC REVIEW OF SYSTEMS     Vision:    []   glasses     []  legally blind  Hearing: []  hearing aids    Nutrition: []  normal   []  impaired  []  vegan  []  vegetarian  []  low salt  []  low-carb   Swallowing: []  impaired   Dentures:   []  yes    Depression:  []  yes   Cognition:     Incontinence: [] urine  []  fecal  []  urine and fecal  ADVANCED CARE PLANNING     Advanced directives on file: []  No  []   Yes ? Completed:     Medical DPOA on file:   []   Yes ?   []   No ? Patient designates ** * to be their surrogate medical decision-maker.         HEALTH CARE MAINTENANCE     IMMUNIZATIONS:   Immunization History   Administered Date(s) Administered    COVID-19 [employer] and up) PF, 30 mcg/0.3 mL 09/24/2023    COVID-19 [employer] Fall 2023 12y and up) PF, 30 mcg/0.3 mL 03/27/2022    COVID-19, mRNA, (Pfizer - Purple Cap) 30 mcg/0.3 mL 03/11/2019, 04/01/2019, 10/29/2019    COVID-19, mRNA, bivalent (Pfizer) 30 mcg/0.3 mL (12y and up) 11/25/2020, 07/05/2021    COVID-19, mRNA, tris-sucrose (Pfizer - [employer]) 30 mcg/0.3 mL 06/20/2020    DTaP 12/31/2015    influenza vaccine IM quadrivalent (Fluzone Quad) MDV (16 months of age and older) 11/26/2016    influenza vaccine IM quadrivalent adjuvanted (FluAD Quad) (PF) SYR (58 years of age and older) 11/05/2018, 12/01/2019, 11/25/2020    influenza vaccine IM quadrivalent high dose (Fluzone High Dose Quad) (PF) SYR (14 years of age and older) 11/22/2021    influenza vaccine IM trivalent high dose (Fluzone High Dose) (PF) SYR (45 years of age and older) 12/11/2022    influenza, unspecified formulation 05/10/2014, 12/29/2015, 11/26/2016, 04/01/2019, 04/01/2019, 12/01/2019, 12/01/2019, 12/01/2019, 12/01/2019, 11/25/2020, 11/25/2020, 01/03/2022, 12/11/2022    meningococcal conjugate 4-valent (Menveo 2-vial) IM MCV4O 01/23/2023    pneumococcal conjugate vaccine 20-valent (Prevnar 20) 07/05/2021    pneumococcal polysaccharide vaccine 23-valent (Pneumovax) 01/10/2019       Mammogram:  DXA:  PAP:  Colonoscopy:      PHYSICAL EXAM     BP 130/76  ~ Pulse (!) 101  ~ Temp 36.9 ?C (98.4 ?F)  ~ Resp 16  ~ Wt 136 lb (61.7 kg)  ~ SpO2 93% Comment: 2L ~ BMI 22.63 kg/m?   Wt Readings from Last 3 Encounters:   01/21/24 136 lb (61.7 kg)   12/03/23 137 lb 3.2 oz (62.2 kg)   10/31/23 139 lb (63 kg)     84% RA!!!    System Check if normal Positive or additional negative findings   GEN  [x]  NAD     Eyes  []  Conj/Lids []  Pupils  []  Fundi   []  Sclerae []  EOM     ENT  []  External ears   []  Otoscopy   []  Gross Hearing    []   External nose   []  Nasal mucosa   []  Lips/teeth/gums    []  Oropharynx    []  Mucus membranes      Neck  [x]  Inspection/palpation    [x]  Thyroid     Resp  [x]  Effort    []  Auscultation    Distant Breath sounds   CV  [x]  Rhythm/rate   [x]  Murmurs   [x]  Edema   []  JVP non-elevated    Normal pulses:   []  Radial []  Femoral  []  Pedal  +HJR   Breast  []  Inspection []  Palpation     GI  []  Bowel sounds    [x]  Nontender   [x]  No distension    []  No rebound or guarding   []  No masses   []  Liver/spleen    []  Rectal     GU  F:  []  External []  vaginal wall         []  Cervix     []   mucus        []  Uterus    []  Adnexa   M:  []  Scrotum []  Penis         []  Prostate     Lymph  []  Cervical []  supraclavicular     []  Axillae   []  Groin/inguinal     MSK []  Gait  []  Back     Specify site examined:    []  Inspect/palp []  ROM   []  Stability []  Strength/tone []  Used arms to push up from seated to standing position   Assistive device:  []  single point cane []  quad cane  []  FWW []  rollator walker      Skin  []  Inspection []  Palpation     Neuro  []  Alert and oriented     []  CN2-12 intact grossly   []  DTR      []  Muscle strength      []  Sensation   []  Pronator drift   []  Finger to Nose/Heel to Shin   []  Romberg     Psych  []  Insight/judgement     []  Mood/affect    []  Gross cognition            LABS/STUDIES     LABS:  Lab Results   Component Value Date    WBC 5.94 10/31/2023    WBC 5.52 09/24/2023    WBC 7.71 07/18/2023    HGB 13.1 10/31/2023    HGB 13.8 09/24/2023    HGB 13.5 07/18/2023    MCV 99.3 (H) 10/31/2023    PLT 279 10/31/2023    PLT 299 09/24/2023    PLT 335 07/18/2023     Lab Results   Component Value Date    NA 141 10/31/2023    NA 139 09/24/2023    NA 138 08/19/2023    K 4.2 10/31/2023    K 4.5 09/24/2023    K 4.3 08/19/2023    CREAT 1.34 (H) 10/31/2023    CREAT 1.75 (H) 09/24/2023    CREAT 2.25 (H) 08/19/2023    GFRESTNOAA 29 06/09/2020    GFRESTNOAA 28 05/18/2020    GFRESTNOAA 30 03/23/2020    GFRESTAA 34 06/09/2020    GFRESTAA 33 05/18/2020    GFRESTAA 35 03/23/2020    CALCIUM  9.5 10/31/2023    CALCIUM  9.6 09/24/2023    CALCIUM  9.3 08/19/2023     Lab Results   Component Value Date    ALT 7 (L) 10/31/2023    ALT 8 09/24/2023    AST 20 10/31/2023    AST 21 09/24/2023    ALKPHOS 66 10/31/2023    BILITOT 0.2 10/31/2023    ALBUMIN 4.1 10/31/2023    ALBUMIN 4.2 09/24/2023     Lab Results   Component Value Date    TSH 1.6 09/24/2023    TSH 2.5 04/17/2023    TSH 1.5 12/11/2022     Lab Results   Component Value Date    HGBA1C 6.0 (H) 04/17/2023    HGBA1C 5.9 (H) 07/05/2022    HGBA1C 5.3 05/03/2022     Lab Results   Component Value Date    CHOL 160 12/11/2022    CHOL 205 07/05/2022    CHOL 226 01/07/2022     Lab Results   Component Value Date    CHOLDLQ 76 12/11/2022    CHOLDLQ 135 (H) 07/05/2022    CHOLDLQ 144 (H) 01/07/2022     Lab Results   Component Value Date    VITD25OH  76 10/18/2021     Lab Results   Component Value Date    FE 87 07/14/2019    FERRITIN 57 07/14/2019    FOLATE 9.6 11/25/2019    TIBC 356 07/14/2019     Lab Results   Component Value Date    VITAMINB12 3,994 (H) 11/25/2019    VITAMINB12 217 (L) 09/22/2019     Lab Results   Component Value Date    BNP 498 (H) 10/31/2023    BNP 355 (H) 08/19/2023    BNP 648 (H) 08/01/2023     No results found for: ''PSATOTAL''      STUDIES:      EKG  Jan 21, 2024  NSR, LBBB        XR CHEST PA LAT 2V  September 22, 2019   COMPARISON: KUB December 17, 2018     History: sob     FINDINGS:     Lungs: Clear  Heart/aorta: Normal heart size. The thoracic aorta is mildly calcified, tortuous and ectatic.  Adenopathy: None  Pleura: No effusion  Bones and Chest wall: No acute bony or body wall  findings. Osteopenia and bony maturational changes. Right upper quadrant cholecystectomy clips stable from October 2020        IMPRESSION:     No acute findings or abnormalities related to provided history.      MRI C spine  May 03, 2021  IMPRESSION:  Redemonstrated congenital cervical spinal canal stenosis with superimposed multilevel degenerative changes, which are mildly progressive compared to prior as above. No evidence of cord compression or cord signal abnormality.      Echo  Jan 2023  1. Normal left ventricular size.  2. There are LV regional wall motion abnormalities.  3. Left ventricular ejection fraction is approximately 35 to 40%.  4. Abnormal LV diastolic function.  5. There are no prior studies on this patient for comparison purposes.  ASSESSMENT and PLAN     [name] is a 88 y.o. female who presents today for *follow up**.    #    #Acute on Chronic hypoxemic respiratory failure--oxygen dependent but markedly worse!  Suspect component of fluid overload as sx improved last week after three days of diuresis.  SX worsened more recently with superimposed productive cough with yellow sputum.  Check labs.  Check Cx.  Admit for diagnostic work up and diuresis as well as antibiotics.  Have low threshold to obtain CT Chest to better evaluate both airspace as well as pulmonary arteries--at high risk for DVT/PE given active breast cancer and use of aromatase inhibitor.  Will request EP discussion for consideration of biventricular pacer if this is felt to benefit SX in short run.  Admission discussed with admitting resident at [phone].  #Major Depression--active--sTolerating Duloxetine . Maintaining clonazepam  given severe anxiety now.  #Myesthenia Gravis--not worse. On  ULTOMIRIS . Need to clarify timing of most recent infusion.  #HFrEF--EF 20%noted on echo-- 2023--Worse.  Chronic.  Will monitor and treat underlying risk factors with medical and lifestyle interventions as warranted by balance between benefits and burdens.  Had planned Biventricular syncronized pacer 100%  and will request evaluation for this procedure while in house.  #CAD--discussed medical management alone with shared decision making model May 11, 2021.  Declines stent.  #Cervical myelopathy--monitored by neuro--chronic--re-imaged on MRI as above.  CTM.  #HTN--Increased nifedipine  CR 60 mg daily. Continue losartan  50 mg.  BP Better.    #CKD--stage 4--chronic--CTM--control BP as tolerated by symptoms of orthostasis and  fatigue.  Trial off trimethoprim .  REcheck Cr now and in two weeks.  #GERD--s/p Nisan fundoplication  #COPD--mixed--per PFT's from [place] .--albuterol  prn.  May resume other inhalders.  #s/p rectocele repair  #s/p cholecystectomy  #Immunosupressed status--medication related.  Chronic. CTM  #Nocturnal hypoxemia--presumably related to MG crisis but has history of COPD.  Will order nocturnal home O2 study and consider PFT's/Pulmonary evaluation once settled down.  May need to restart BREO inhaler.  #Myasthenia Gravis--s/p plasmapheresis x5 October 2020, no Thymoma, on Azathioprine  150 daily and mestinon  60 tid.  [facility] Montell at [facility].  Was considering Soliris.  Ordering IVIG and MRI.  Encourage ongoing neurology follow up.   #IBS--titrate miralax  to comfort.    #Interstitial cystitis--stopping daily Trimethoprim  100 for UTI prophylaxis.  Using bladder instillations prn. OFF topical estrogen due to advice from neurologist re: MG.    #Breast CA--under care.  Patient continues to decline surgery.    #DM with CKD--stabe 3b--based on labs from [place] .  #OA knees--previously getting hyaluronic acid injections.  #Anxiety  #Benzodiazepine Dependence--uncomplicated--chronic.  Encourage patient to decrease intake--ideally to abstaining but will work towards this goal over time.  Not likely to achieve this given ongoing concerns re: breast ca.  #Fibromyalgia--duloxetine  as above.   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  #Statin intolerance    #Advanced directives--SNR/DNI/No feeding tubes.    FOLLOW-UP     RTC     Future Appointments   Date Time Provider Department Center   01/21/2024 12:30 PM Kaitlyn Penne POUR., MD GERI IMS [address] [place]/Cen   02/03/2024 11:00 AM Leavy Gruber, MD CRD DIS 2020 [facility]   02/06/2024  9:20 AM Kaitlyn Penne POUR., MD GERI IMS [address] [place]/Cen   02/06/2024  2:00 PM Kaitlyn Penne POUR., MD GERI IMS [address] [place]/Cen   03/31/2024 11:00 AM Ely Carte, MD HEM/ONC 230 [facility]   04/07/2024 11:00 AM SM 2020 ECHO 03 CARDIOLOGY CARDIAC IMAGING CI [id number] [facility]         The above plan of care, diagnosis, orders, and follow-up were discussed with the patient and/or surrogate. Questions related to this recommended plan of care were answered.    ANALYSIS OF DATA (Needs to meet 1 category for moderate and 2 categories for high LOS)     I have:     Category 1 (Needs 3 for moderate and high LOS)     []  Reviewed []  1 []  2 []  >= 3 unique laboratory, radiology, and/or diagnostic tests noted below    Test/Study:  on date .    []  Reviewed []  1 []  2 []  >= 3 prior external notes and incorporated into patient assessment    I reviewed Dr. 's note in specialty  from date .    []  Discussed management or test interpretation with external provider(s) as noted      []  Ordered []  1 []  2 []  >= 3 unique laboratory, radiology, and/or diagnostic tests noted in A&P    []  Obtained history from independent historian:       Category 2  []  Independently interpreted the test    Category 3  []  Discussed management or test interpretation with external provider(s) as noted      INTERACTION COMPLEXITY and SOCIAL DETERMINANTS of HEALTH       PROBLEM COMPLEXITY   []  New problem with uncertain diagnosis or prognosis (moderate)   []  Multiple stable chronic problems (moderate)   []  Chronic problem not stable - not controlled, symptomatic, or worsening (  moderate)   []  Severe exacerbation of chronic problem (high)   []  New or chronic problem that poses threat to life or bodily function (high)     MANAGEMENT COMPLEXITY   []  Old or external/outside records reviewed   []  Discussion with alternate (proxy) if patient with impaired communication / comprehension ability (e.g., dementia, aphasia, severe hearing loss).   []  Repeated questions (or disagreement) between patient and/among caregivers/family during the visit.  []  Caregiver/patient emotions/behavior/beliefs interfering with implementation of treatment plan.  []  Independent interpretation of test (EKG, Chest XRay)   []  Discussion of case with a consultant physician     RISK LEVEL   []  Prescription drug management (moderate)   []  Minor surgery with CV risk factors or elective major surgery (moderate)   []  Dx or Rx significantly limited by SDoH (inadequate housing, living alone, poor health care access, inappropriate diet; low literacy) (moderate)   []  Major surgery - elective with CV risk factors or emergent (high)   []  Need for hospitalization (high)   []  New DNR or de-escalation of care (high)      SDoH  The diagnosis or treatment of said conditions is significantly limited by the following social determinants of health:  []  Z59.0 Homelessness  []  Z59.1 Inadequate housing  []  Z59.2 Discord with neighbors, lodgers and landlord  []  Z60.2 Problems related to living alone  []  Z59.8 Other problems related to housing and economic circumstances  []  Z59.4 Lack of adequate food and safe drinking water  []  Z59.6 Low income  []  Z59.7 Insufficient social insurance and welfare support  []  Z59.9 Problems related to housing and economic circumstances, unspecified  []  Z75.3 Unavailability and inaccessibility of health care facilities  []  Z75.4 Unavailability and inaccessibility of other helping agencies  []  Z72.4 Inappropriate diet and eating habits  []  Z62.820 Parent-biological child conflict  []  Z63.8 Other specified problems related to primary support group  []  Z55.0 Illiteracy and low level literacy   []  Z56.9 Unspecified problems related to employment        If Billing Based on Time:     I performed the following items on the day of service:    [x]  Preparing to see the patient (e.g., review of tests)  [x]  Obtaining and/or reviewing separately obtained history   [x]  Performing a medically appropriate examination and/or evaluation   [x]  Counseling and educating the patient/family/caregiver   [x]  Ordering medications, tests, or procedures  [x]  Referring and communicating with other healthcare professionals (when not separately reported)  [x]  Documenting clinical information in the EHR  []  Independently interpreting results and communicating results to patient/family/caregiver    I spent the following total amount of time on these tasks on the day of service:  New Patient     Established Patient  []  15-29 minutes - 99202    []  up to 9 minutes - 99211  []  30-44 minutes - 99203     []  10-19 minutes - 99212   []  45-59 minutes - 99204      []  20-29 minutes - 99213   []  60-74 minutes - 99205   [x]  30- minutes - 99214         []  40-55 minutes - 99215    []  I spent an additional ** * 15-minute-increment(s) for a total of * ** minutes on these tasks on the day of service. [phone] for each additional 15 minutes.)    Author: Penne Kaitlyn Quaker, MD 01/21/2024  Annual Wellness Visit:     []  Initial Medicare Annual Wellness Visit   [x]  Subsequent Medicare Annual Wellness Visit - at least 365 days since last AWV     [x]  I reviewed patient's past medical history, family history and medications. I updated the electronic record.    [x]  I reviewed with patient what other consulting doctors and providers are involved in the patient's care. See above for list.    Patient's Self Reported Health Status: []  poor []  fair [x]  good []  excellent  []  unreliable per patient given cognitive impairment  Hospitalizations in the past year:  []  none  []  More than 3  [x]   Less than 3  More than 5 chronic medical conditions: [x]  yes []  no   Polypharmacy (greater than 6 chronic medications):  [x]  yes []  no    Opioid assessment:  - Taking opioids: []  yes [x]  no  - If yes, address the following:  - Current pain severity:   - Evaluate current treatment plan and changes needed: []  No   []  Yes ->   - Discussed non-opioid treatment options  - Referred to specialist: []  yes []  No    Advanced directive:    []  Yes   []  No     []  On file   []  Reviewed     []  Discussed - see below   []  Materials given      Functional and Physical Assessment Cognitive & Social Assessments   Visual Acuity (required for IPPE):    [x]  Regularly sees ophthalmologist    []  Recommend eye exam    Hearing Screen (whisper test):    []  Denies hearing complaints   []  Able to hear whisper or finger-rub at 6 feet.   []  Audiology referral placed   [x]  Hearing aides present    Dental health:  [x]  Sees dentist regularly  []  Dentures/partials  []  Known gum disease  []  Needs active dental work beyond routine care    Weight Loss: []  yes []  no    Get Up and Go Test: >12    Ambulatory Device:    []  none []  cane [x]   walker []   WC    Fall in past year: []  none    [x]  yes  Afraid of Falling: [x]  yes       []  no    Activities of Daily Living:   []  Independent in all ADLs and IADLs   [x]  Dependent in the following              []   Bathing    [x]  Groceries            []   Dressings [x]  Driving/bus            []   Toileting [x]  Telephone            []   Continence [x]  Chores []   Grooming [x]  Cooking            []   Feeding [x]  Laundry            []  Transferring [x]  Medications    [x]  Finances    Mood: PHQ-9 6  Cognition: mini-cog 4/5    Living situation:      []  home     [x]  Assisted Living/board & care        []  lives alone         []  lives with family        []  caregiver not needed      []   caregiver support is sufficient       [x]  has employed caregiver(s)        []  family provides caregiving        []  Provides care for another person      [x]  retired    []  still working    [x]  wears a seatbelt    [x]  no current tobacco use  []  tobacco cessation counseling provided  [x]  no current alcohol use    []  acceptable amount of alcohol use. Counseled on moderation  []  alcohol cessation counseling provided  [x]  no current drug/substance use  []  substance cessation counseling provided    []  exercises & discussed recommendations  [x]  unable to exercise given functional limitations    []  Discussed home safety: rugs, bathroom safety (grab-bars, non-slip pads), handrails for stairs, clutter, slippery surfaces, safe lighting and firearms    []  Diet reviewed and recommendations provided on healthy eating  []  Food insecurity    []  I reviewed patient's social history (see above for additional details)             # Healthcare Maintenance   []  Refer for mammogram   []  Refer for DXA   []  PAP smear today   []  Order FIT test   []  Refer for colonoscopy   []  Screen for hepatitis C    []  Screen for hepatitis B    []  Check PSA    []  Refer for AAA screening   []  Vaccinations ordered today:     []  Influenza  []  PCCV-20 []  pneumovax-23  (only if had PCCV-13 in past year)           []  shingrix    []  COVID       []  hepatitis B   []  Tdap/Td  []  RSV      # Advanced Care Planning/goals of care discussion.  I reviewed with Emerita Berkemeier [place]/patient's surrogates today regarding patient's goals of care and advanced care planning on a voluntary basis for them.      Time spent: []  Initial 30 minutes; specifically minutes (at least 16 minutes) []  Additional 30 minutes    Please see separate Goals of Care note from today for additional details.     Topics discussed today:   []  Medical conditions  []  Prognosis  []  Goals of care  []  Designation of surrogate medical decision-maker  []  Completion of advanced directives   []  Form provided  []  Completion of POLST   Part A:  []  Attempt resuscitation/CPR  []   Do not attempt resuscitation   Part B:  []  Full treatment  []  Selective treatment  []  Comfort-focused treatment   Part C:  []  Long-term artificial nutrition  []  Trial of artificial nutrition []  No artificial nutrition          Advice/Referrals:   Subsequent annual wellness visit--at least 365 days since last annual wellness visit.       The additional diagnoses assessed at this visit listed below constituted a separate, identifiable service from the routine preventive health examination.

## 2024-01-21 ENCOUNTER — Telehealth: Payer: MEDICARE

## 2024-01-21 ENCOUNTER — Ambulatory Visit: Payer: Commercial Managed Care - Pharmacy Benefit Manager | Attending: Geriatric Medicine

## 2024-01-21 ENCOUNTER — Ambulatory Visit: Payer: PRIVATE HEALTH INSURANCE

## 2024-01-21 DIAGNOSIS — J189 Pneumonia, unspecified organism: Secondary | ICD-10-CM

## 2024-01-21 DIAGNOSIS — R0602 Shortness of breath: Secondary | ICD-10-CM

## 2024-01-21 DIAGNOSIS — Z23 Encounter for immunization: Secondary | ICD-10-CM

## 2024-01-21 DIAGNOSIS — J9621 Acute and chronic respiratory failure with hypoxia: Secondary | ICD-10-CM

## 2024-01-21 NOTE — Telephone Encounter
 Medication Verification      Medication: furosemide  20 mg tablet     Caller would like to verify the following (Please check all that applies):    [x]  Dosage    []  Quantity      [x]  Directions     Additional information?   Marissa from [employer] called to f/u on the medication, furosemide  20 mg tablet. Per Marissa, [name] increased it to 40 mg tablet for 3 days and recommended for pt to go back to the normal one. Marissa wanted to know which dosage is the correct one and to send this mediation for a refill to [facility] .      CBN [phone]    Patient or caller has been notified of the turnaround time of 1-2 business day(s).

## 2024-01-21 NOTE — Nursing Clinical Note
 NURSING NOTE FOR IV THERAPY    Start time of infusion:     End time of infusion:     IV Therapy  []  500 mL - 0.9% sodium chloride    []  1000 mL - 0.9% sodium chloride      Site  [x]  Right antecubital   []  Left Antecubital   []  Right hand   []  Left hand   Catheter size  []  18G   []  20G   [x]  22G   []  25G   Attempt(s)  [x]  x1       []  x2   Rate  bolus   No infusion ordered ,  Comments:      [name] tolerated the procedure well without any complaints. No swelling, pain, or hematoma noted. Informed MD about patient's response after completion of infusion: BP 130/76 P 101. Removed IV catheter per MD's order. Catheter tip intact. Sterile 2x2 gauze dressing applied. Discharged patient per MD's instructions.     [name], LVN , have completed the physician's order for IV therapy on 01/21/2024.  [name], MD has been notified of the results.

## 2024-01-21 NOTE — H&P
 Internal Medicine History and Physical      Patient: [name]  MRN: [id number]  Date of Service: 01/21/2024  Primary Care Provider: [name], MD  Attending Physician: Bluford Rocky LABOR., MD    Chief Complaint   No chief complaint on file.      History of Present Illness   [name] is a 88 y.o. 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 Patient states that she typically uses oxygen prn at home but over the last month she has been using it almost around the clock. Feels that she can't be away from the O2 very long before she feels very short of breath. Sometimes sleeps in a sitting position, difficult to sleep with many pillows because of a crick in her neck. For the last 4 days, has been taking lasix  60mg  daily. Weighs ~129lbs normally. Weighed 138 today at her PCP's office, but pt states she was wearing a lot of clothes (on review of weights at PCP officse she has been 138 140lbs over last 6 months)    Pt also endorses chills x 2  weeks and productive cough of yellow-green sputum x 2 days. Using her PRN inhaler more often, taking her other inhaler as prescribed. Unclear if taking montelukast  or not. No known sick contacts but lives at an ALF [address].     14-point ROS negative except as outlined above.    Emergency Department Course     ED Triage Vitals   Temp Temp Source BP Heart Rate Resp SpO2 O2 Device Pain Score Weight   01/21/24 1705 01/21/24 1705 01/21/24 1650 01/21/24 1650 01/21/24 1705 01/21/24 1650 01/21/24 1627 01/21/24 1705 --   37.2 ?C (98.9 ?F) Oral 113/47 97 20 95 % Nasal cannula Zero        Past Medical History     Past Medical History:   Diagnosis Date    Asthma     Breast cancer (HCC/RAF) 12/20/2018    CHF (congestive heart failure) (HCC/RAF)     COPD (chronic obstructive pulmonary disease) (HCC/RAF)     Diabetes mellitus (HCC/RAF)     Renal disorder        Past Surgical History   No past surgical history on file.     Allergies     Allergies   Allergen Reactions    Beta Adrenergic Blockers Other (See Comments)     Avoid d/t Myasthenia Gravis  Avoid d/t Myasthenia Gravis      Levofloxacin Other (See Comments)     Flare of Myasthenia Gravis    Macrolides And Ketolides Other (See Comments)     Avoid d/t Myasthenia Gravis  Use with caution d/t Myasthenia Gravis  Use with caution d/t Myasthenia Gravis      Sulfa Antibiotics Other (See Comments)     May possibly have caused deafness in one ear  May possibly have caused deafness in one ear  other      Botulinum Toxins Other (See Comments)     Muscle weakness r/t Myasthenia Gravis  Muscle weakness r/t Myasthenia Gravis      Pollen Extract Wheezing    Statins Other (See Comments)     Muscle aches  Muscle aches  Muscle aches      Plastic Tape Itching and Other (See Comments)  Turns the skin red where applied       Home Medications     Prior to Admission medications   Medication MD Sig Start Date End Date Taking? Authorizing Provider   ALBUTEROL  90 mcg/act inhaler INHALE 1-2 PUFF BY MOUTH EVERY 4 HOURS AS NEEDED FOR WHEEZING OR SHORTNESS OF BREATH 08/12/23   [name], MD   aspirin  81 mg EC tablet 81 mg, Oral, Daily 08/12/23   Koretz, Brandon K., MD   azaTHIOprine  50 mg tablet 75 mg, Oral, Daily 09/27/23   Karasozen, Yigit, MD   BUPROPION , SR, 200 mg 12 hr tablet TAKE 1 TAB BY MOUTH TWICE DAILY (ANTIDEPRESSANT) 08/12/23   [name], MD   clonazePAM  1 mg tablet TAKE 1 AND HALF TABLET BY MOUTH AT NIGHT 09/04/23   [name], MD   dapagliflozin  (FARXIGA ) 10 mg tablet 10 mg, Oral, Daily 09/17/23   Koretz, Brandon K., MD   DEXILANT  60 MG DR capsule 60 mg, Oral, Daily 09/02/23   Koretz, Brandon K., MD   DULOXETINE  20 mg DR capsule 60 mg, Oral, Daily 07/25/23   Koretz, Brandon K., MD   EPINEPHrine  0.3 mg/0.3 mL auto-injector INJECT 0.3ML (0.3MG ) SUBCUTANEOUSLY AS NEEDED FOR ANAPHYLAXIS (ACUTE AND CHRONIC RESPIRATORY FAILURE WITH HYPOXIA) 08/12/23   [name], MD   exemestane  25 mg tablet 25 mg, Oral, Daily 09/17/23   Koretz, Brandon K., MD   fluticasone -vilanterol 100-25 mcg/act inhaler INHALE 1 PUFF BY MOUTH EVERY DAY (ACUTE AND CHRONIC RESPIRATORY FAILURE WITH HYPOXIA) 08/12/23   [name], MD   furosemide  20 mg tablet 20 mg, Oral, Daily 08/14/23 08/13/24  Koretz, Brandon K., MD   LOSARTAN  100 mg tablet TAKE 1 TAB BY MOUTH EVERY DAY (HEART FAILURE, UNSPECIFIED) 08/12/23   [name], MD   metoprolol  succinate 25 mg 24 hr tablet 25 mg, Oral, Daily 10/17/23 10/16/24  Tabibiazar, Ramin, MD   metoprolol  succinate 25 mg 24 hr tablet 25 mg, Oral, Daily 12/03/23 12/02/24  Tabibiazar, Ramin, MD   MONTELUKAST  10 mg tablet TAKE 1 TAB BY MOUTH EVERY EVENING (ACUTE AND CHRONIC RESPIRATORY FAYLURE WITH HYPOXIA) 08/12/23   [name], MD   PENICILLIN  V POTASSIUM 500 mg tablet TAKE 1 TAB BY MOUTH TWICE DAILY (BACTERIAL INFECTION) 08/12/23   [name], MD   PYRIDOSTIGMINE  60 mg tablet TAKE 1 TAB BY MOUTH THREE TIMES DAILY (MYASTHENIA GRAVIS) 08/12/23   [name], MD   REPATHA  SURECLICK 140 MG/ML SOAJ INJECT 1 PEN UNDER THE SKIN EVERY 14 DAYS (HEART FAILURE, UNSPECIFIED) 08/12/23   [name], MD   sodium chloride  0.9% IV soln  06/14/23   PROVIDER, HISTORICAL   tiotropium 18 mcg/act inhalation capsule 18 mcg, Inhalation, Daily  Patient not taking: Reported on 01/21/2024 06/06/23 06/05/24  [name], MD   ULTOMIRIS 1100 MG/11ML SOLN  06/14/23   PROVIDER, HISTORICAL   ULTOMIRIS 300 MG/3ML injection  03/15/23   PROVIDER, HISTORICAL        Social History    reports that she quit smoking about 40 years ago. Her smoking use included cigarettes. She has quit using smokeless tobacco. She reports that she does not drink alcohol and does not use drugs.    Social History     Social History Narrative    Not on file       Family History   No family history on file.    Physical Exam   Temp:  [36.3 ?C (97.4 ?  F)-37.2 ?C (98.9 ?F)] 36.3 ?C (97.4 ?F)  Heart Rate:  [97-101] 98  Resp:  [16-20] 17  BP: (113-130)/(47-76) 116/65  NBP Mean:  [67-80] 80  SpO2:  [90 %-95 %] 93 %   UOP:   I/Os:   Intake/Output Summary (Last 24 hours) at 01/21/2024 2049  Last data filed at 01/21/2024 2000  Gross per 24 hour   Intake 200 ml   Output --   Net 200 ml       Physical Exam  General: NAD, well developed, well nourished, appears stated age.  HEENT: Sclera incteric, PERRL, MMM, OP with no sores or lesions, no cervical or supraclavicular lymphadenopathy.   Cardiovascular: RRR, normal S1, S2. No additional heart sounds appreciated. No BLE edema. JVP at 7 cm.   Respiratory: Normal effort. Clear to auscultation bilaterally. On NC  GI: Soft, nontender, nondistended. NABS.  Skin: Normal skin turgor, no rash appreciated.   MSK: Warm and well perfused. No joint deformities appreciated. Capillary refill <2 seconds.   Neuro: Alert and oriented x 4. EOMI. Moves all extremities spontaneously.  Psych: Normal mood and affect.     Laboratory Data   CBC:  Recent Labs     01/21/24  1350   WBC 8.21   HGB 12.5   HCT 38.4   MCV 98.2   PLT 365     BMP:  Recent Labs     01/21/24  1445   NA 138   K 5.2   CL 101   CO2 28   BUN 24*   CREAT 1.54*   GLUCOSE 90   CALCIUM  9.4     LFT:  Recent Labs     01/21/24  1445   TOTPRO 7.0   ALBUMIN 4.1   BILITOT 0.2   ALT 9   AST 18   ALKPHOS 76     COAGS:  No results for input(s): ''INR'', ''PT'', ''APTT'' in the last 72 hours.  UA:  No results for input(s): ''SPECGRAVUR'', ''PHUR'', ''BLDUR'', ''KETONESUR'', ''GLUCOSEUR'', ''PROTCLUR'', ''NITRITEUR'', ''LEUKESTUR'', ''RBCSUR'', ''WBCSUR'' in the last 72 hours.    Microbiology     Recent Results (from the past 2 weeks)   Bacterial Culture Blood    Collection Time: 01/21/24  2:45 PM    Specimen: Peripheral Vein #2; Blood   Result Value Ref Range    Blood Culture - Preliminary status Negative To Date Negative   Bacterial Culture Blood    Collection Time: 01/21/24  2:45 PM    Specimen: Peripheral Vein; Blood   Result Value Ref Range    Blood Culture - Preliminary status Negative To Date Negative       Imaging   CXR 01/21/24  IMPRESSION:  Stable cardiac silhouette. Calcification of the thoracic aorta.  Bands of atelectasis or scar within the lung bases. Mild bronchial wall thickening. No focal consolidation.  No gross effusions.  Degenerative changes of the spine.    Assessment/Plan   [name] is a 88 y.o. female w/ h/o myasthenia gravis, IDC in bilateral breasts ER (+)/ HER 2 (-), HFrEF (EF 20%), COPD, CAD, CKD stage 4, cervical myelopathy presenting with acute on chronic hypoxemic resp failure    #acute on chronic hypoxemic respiratory failure  Chronically oxygen dependent but markedly worse per outpatient geriatrician assessment. Initially suspected to be a component of fluid overload as sx improved last week after three days of diuresis. However CXR w/o sig overload, exam with euvolemia, BNP appears to be at baseline. SX worsened  more recently with superimposed productive cough with yellow sputum, may have component of COPD exacerbation. Pt also with history of MG - able to take deep breaths on exam, well controlled per outpatient neuro notes.   - s/p lasix  40mg  IV x1  - daily weights  - strict I/Os  - trend BMP, K/Mg 4/2  - treat for COPD exacerbation as below  - f/u RPP  - consider CT A/Pat high risk for DVT/PE given active breast cancer and use of aromatase inhibitor.   - Geriatrician to request EP discussion for consideration of biventricular pacer if this is felt to benefit SX in short run (scheduled for pacemaker placement in Jan 2026    #COPD, possible exacerbation  Mixed--per PFT's from [place] . Pt states that she may have asthma and not COPD? Remote smoking history when she was in her 20s. Family history of asthma. Was not able to tolerate PFTs per patient. Will treat for COPD exacerbation during this admission given increasing inhaler use and yellow/green sputum  - fluticasone -salmeterol inhaler BID (home inhaler not on formulary)  - prednisone  40mg  daily x 5 days  - CTX/doxy x 5 days  - duo nebs q4h waking hours  - hold montelukast  pending pharmacy med rec  - consider repeat PFTs outpatient    #HFrEF--EF 20%  Last TTE 06/2023, EF 22% with severely reduced systolic function. Planned for biventricular pacemaker placement in 02/2024  - diuresis as above  - repeat TTE  - held home lasix  20mg  daily  - cont metoprolol  succinate 25mg  daily  - held home losartan  100mg  iso possible AKI  - held home farxiga  10mg  daily iso possible AKI    #Myesthenia Gravis  Well controlled per outpatient notes. 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 Diagnosed on screening mammo in 06/2017. At the time, elected to not have surgery. Was on tamoxifen > anastrozole  for a period of time but discontinued endocrine therapy due to side effects. 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 Advised to have multimodal ex with surgery +/- radiation +/0 systemic (endocrine) therapy. Opted against surgery d/t anesthesia risk from MG. Discussed case with rad/onc for radiation therapy, but pt not currently a candidate for RT.   - previously on anastrazole in 2023 but stopped d/t diarrhea  - switched to exemestane  25mg  daily as of 06/2023  - pharmacy med rec    #CKD vs AKI on CKD4  Cr seems to be around baseline 1.50, low threshold to restart home meds tomorrow  - hold home farxiga , losartan   - can restart pending repeat Cr    #HTN  Per last note, on nifed 60mg  BID, though not on med list  - pharmacy med rec  - losartan  50mg  held as above    #major depression  #chronic benzo use   - clonazepam  1.5mg  nightly (not stopped due to risk of withdrawal)  - duloxetine  60mg  daily   - buproprion 150mg  BID    #CAD  Per outpatient note, prefers medical management and declines stent.  - does not tolerate statin due to sign effects     #Cervical myelopathy  Monitored outpatient by neuro  - CTM    #GERD  s/p Nisan fundoplication  -  cont home pantoprazole  40mg  daily     #s/p rectocele repair  #s/p cholecystectomy    Inpatient Checklist:  Orders Placed This Encounter      Diet 2 gram sodium Low Fat    DVT Prophylaxis: heparin  subcutaneously  GI Prophylaxis: indicated due to patient is on chronic acid suppression therapy as an outpatient: Proton pump inhibitor  Central Lines: none  Tubes/Drains: none    #Disposition: Home    Code Status: DNR (No CPR, defibrilation, intubation) 24HR  Contact:  Primary Emergency Contact: [place]    Patient to be seen and discussed with attending, Dr. Bluford.    Author:  Jone Just, MD  [facility] Internal Medicine - Pediatrics, [place]  [id number]  01/21/2024 8:49 PM     I have interviewed and examined the patient and read the note by Dr. Just on the date of service. All labs, studies, and medications were independently reviewed by me. I have reviewed the note above and agree with the history, physical, assessment and plan which we formulated together. The patient and/or their caregiver was informed of the above plan, demonstrated understanding and was agreeable to the plan as written above. I have the following additions to the note:    The patient needs to stay in the acute care setting tonight because management of COPD exacerbation     Erubiel Manasco A. Bluford, MD

## 2024-01-21 NOTE — Telephone Encounter
 Patient seen by Dr Danetta today and was admitted to [facility].

## 2024-01-21 NOTE — Nursing Clinical Note
 Patient received from direct admit at time of 1613, originally admitted from home, accompanied by dtr.  On arrival, patient oriented x4, able to verbalize needs.  Vital signs noted Blood pressure 113/47, pulse 97, temperature 37.2 ?C (98.9 ?F), temperature source Oral, resp. rate 20, height 1.626 m (5' 4''), SpO2 95%.  Patient is on cardiac monitor.   [x]  Monitor room notified (if applicable).  Patient is not on continuous pulse oximetry.   Heart rhythm is normal sinus rhythm with none for ectopy.  Patient is on 2  liters/min via nasal cannula  Skin assessment done with 2nd RN Ngozi, noted C/D/I  Patient is not on isolation.    Patient has no home medications brought into hospital.    The patient was oriented/educated to call light, bed, and surroundings.  Call light in reach.  Fall prevention education provided.    Established phone updates with DPOA  daily at 1400 hours.

## 2024-01-22 ENCOUNTER — Inpatient Hospital Stay
Admission: EM | Admit: 2024-01-22 | Discharge: 2024-01-24 | Disposition: A | Discharge status: Home / Self Care | Payer: PRIVATE HEALTH INSURANCE | Source: Home / Self Care

## 2024-01-22 ENCOUNTER — Ambulatory Visit: Payer: PRIVATE HEALTH INSURANCE

## 2024-01-22 DIAGNOSIS — I5020 Unspecified systolic (congestive) heart failure: Secondary | ICD-10-CM

## 2024-01-22 DIAGNOSIS — J9621 Acute and chronic respiratory failure with hypoxia: Secondary | ICD-10-CM

## 2024-01-22 DIAGNOSIS — G7000 Myasthenia gravis without (acute) exacerbation: Secondary | ICD-10-CM

## 2024-01-22 DIAGNOSIS — J441 Chronic obstructive pulmonary disease with (acute) exacerbation: Principal | ICD-10-CM

## 2024-01-22 LAB — HS Troponin I (Single)
HIGH SENSITIVITY TROPONIN I: 21 ng/L — ABNORMAL HIGH (ref <5)
HIGH SENSITIVITY TROPONIN I: 23 ng/L — ABNORMAL HIGH (ref <5)
HIGH SENSITIVITY TROPONIN I: 25 ng/L — ABNORMAL HIGH (ref <5)

## 2024-01-22 LAB — COVID-19 PCR

## 2024-01-22 LAB — Basic Metabolic Panel
SODIUM: 139 mmol/L (ref 135–146)
UREA NITROGEN: 23 mg/dL — ABNORMAL HIGH (ref 7–22)

## 2024-01-22 LAB — Differential Automated: IMMATURE GRANULOCYTES%: 0.5 % (ref 0.00–0.04)

## 2024-01-22 LAB — Phosphorus: PHOSPHORUS: 3.8 mg/dL (ref 2.5–4.5)

## 2024-01-22 LAB — Blood Gases, venous,POC: O2 SAT/MEASURED,VENOUS,POC: 91.6 % (ref 23.0–31.0)

## 2024-01-22 LAB — CBC: MEAN PLATELET VOLUME: 10.6 fL (ref 9.3–13.0)

## 2024-01-22 LAB — Respiratory Pathogen Panel PCR: ADENOVIRUS DNA PCR: NOT DETECTED

## 2024-01-22 LAB — Magnesium: MAGNESIUM: 1.7 meq/L (ref 1.4–1.9)

## 2024-01-22 MED ADMIN — DOXYCYCLINE HYCLATE 100 MG PO TABS: 100 mg | ORAL | @ 04:00:00 | Stop: 2024-01-24 | NDC 50268027911

## 2024-01-22 MED ADMIN — DOXYCYCLINE HYCLATE 100 MG PO TABS: 100 mg | ORAL | @ 19:00:00 | Stop: 2024-01-24 | NDC 50268027911

## 2024-01-22 MED ADMIN — MONTELUKAST SODIUM 10 MG PO TABS: 10 mg | ORAL | @ 08:00:00 | Stop: 2024-01-24

## 2024-01-22 MED ADMIN — PREDNISONE 20 MG PO TABS: 40 mg | ORAL | @ 08:00:00 | Stop: 2024-01-24

## 2024-01-22 MED ADMIN — PREDNISONE 20 MG PO TABS: 40 mg | ORAL | @ 08:00:00 | Stop: 2024-01-24 | NDC 60687014511

## 2024-01-22 MED ADMIN — PREDNISONE 20 MG PO TABS: 40 mg | ORAL | @ 06:00:00 | Stop: 2024-01-24 | NDC 60687014511

## 2024-01-22 MED ADMIN — IPRATROPIUM-ALBUTEROL 0.5-2.5 (3) MG/3ML IN SOLN: 3 mL | RESPIRATORY_TRACT | @ 15:00:00 | Stop: 2024-01-24

## 2024-01-22 MED ADMIN — IPRATROPIUM-ALBUTEROL 0.5-2.5 (3) MG/3ML IN SOLN: 3 mL | RESPIRATORY_TRACT | @ 23:00:00 | Stop: 2024-01-24 | NDC 60687040579

## 2024-01-22 MED ADMIN — IPRATROPIUM-ALBUTEROL 0.5-2.5 (3) MG/3ML IN SOLN: 3 mL | RESPIRATORY_TRACT | @ 05:00:00 | Stop: 2024-01-24 | NDC 60687040579

## 2024-01-22 MED ADMIN — IPRATROPIUM-ALBUTEROL 0.5-2.5 (3) MG/3ML IN SOLN: 3 mL | RESPIRATORY_TRACT | @ 17:00:00 | Stop: 2024-01-24 | NDC 60687040579

## 2024-01-22 MED ADMIN — INFLUENZA VAC SPLIT HIGH-DOSE 0.5 ML IM SUSY: 1 | INTRAMUSCULAR | @ 04:00:00 | Stop: 2024-01-24

## 2024-01-22 MED ADMIN — FLUTICASONE-SALMETEROL 100-50 MCG/ACT IN AEPB: 2 | RESPIRATORY_TRACT | @ 05:00:00 | Stop: 2024-01-24 | NDC 00054032656

## 2024-01-22 MED ADMIN — FLUTICASONE-SALMETEROL 100-50 MCG/ACT IN AEPB: 2 | RESPIRATORY_TRACT | @ 17:00:00 | Stop: 2024-01-24 | NDC 00054032656

## 2024-01-22 MED ADMIN — AZATHIOPRINE 50 MG PO TABS: 75 mg | ORAL | @ 18:00:00 | Stop: 2024-01-23

## 2024-01-22 MED ADMIN — AZATHIOPRINE 50 MG PO TABS: 75 mg | ORAL | @ 08:00:00 | Stop: 2024-01-23

## 2024-01-22 MED ADMIN — EXEMESTANE 25 MG PO TABS: 25 mg | ORAL | @ 06:00:00 | Stop: 2024-01-24 | NDC 59762285801

## 2024-01-22 MED ADMIN — EXEMESTANE 25 MG PO TABS: 25 mg | ORAL | @ 19:00:00 | Stop: 2024-01-24 | NDC 59762285801

## 2024-01-22 MED ADMIN — METOPROLOL SUCCINATE ER 25 MG PO TB24: 25 mg | ORAL | @ 04:00:00 | Stop: 2024-01-24 | NDC 60687039011

## 2024-01-22 MED ADMIN — METOPROLOL SUCCINATE ER 25 MG PO TB24: 25 mg | ORAL | @ 17:00:00 | Stop: 2024-01-24

## 2024-01-22 MED ADMIN — PYRIDOSTIGMINE BROMIDE 60 MG PO TABS: 60 mg | ORAL | @ 04:00:00 | Stop: 2024-01-24 | NDC 68084049411

## 2024-01-22 MED ADMIN — PYRIDOSTIGMINE BROMIDE 60 MG PO TABS: 60 mg | ORAL | @ 19:00:00 | Stop: 2024-01-24 | NDC 68084049411

## 2024-01-22 MED ADMIN — PYRIDOSTIGMINE BROMIDE 60 MG PO TABS: 60 mg | ORAL | @ 07:00:00 | Stop: 2024-01-24

## 2024-01-22 MED ADMIN — PANTOPRAZOLE SODIUM 40 MG PO TBEC: 40 mg | ORAL | @ 04:00:00 | Stop: 2024-01-24 | NDC 60687073609

## 2024-01-22 MED ADMIN — PANTOPRAZOLE SODIUM 40 MG PO TBEC: 40 mg | ORAL | @ 19:00:00 | Stop: 2024-01-24 | NDC 60687073609

## 2024-01-22 MED ADMIN — HEPARIN SODIUM (PORCINE) 5000 UNIT/ML IJ SOLN: 5000 [IU] | SUBCUTANEOUS | @ 19:00:00 | Stop: 2024-01-24 | NDC 00409272330

## 2024-01-22 MED ADMIN — HEPARIN SODIUM (PORCINE) 5000 UNIT/ML IJ SOLN: 5000 [IU] | SUBCUTANEOUS | @ 05:00:00 | Stop: 2024-01-24 | NDC 00409272330

## 2024-01-22 MED ADMIN — BUPROPION HCL ER (SR) 150 MG PO TB12: 150 mg | ORAL | @ 08:00:00 | Stop: 2024-01-23

## 2024-01-22 MED ADMIN — BUPROPION HCL ER (SR) 150 MG PO TB12: 150 mg | ORAL | @ 18:00:00 | Stop: 2024-01-23

## 2024-01-22 MED ADMIN — CEFTRIAXONE 1 G IN SWFI IVP: 1 g | INTRAVENOUS | @ 05:00:00 | Stop: 2024-01-24 | NDC 00409733211

## 2024-01-22 MED ADMIN — ASPIRIN 81 MG PO TBEC: 81 mg | ORAL | @ 19:00:00 | Stop: 2024-01-24 | NDC 77333003125

## 2024-01-22 MED ADMIN — ASPIRIN 81 MG PO TBEC: 81 mg | ORAL | @ 04:00:00 | Stop: 2024-01-24 | NDC 77333003125

## 2024-01-22 MED ADMIN — DULOXETINE HCL 60 MG PO CPEP: 60 mg | ORAL | @ 19:00:00 | Stop: 2024-01-24 | NDC 68001059604

## 2024-01-22 MED ADMIN — DULOXETINE HCL 60 MG PO CPEP: 60 mg | ORAL | @ 04:00:00 | Stop: 2024-01-24 | NDC 68001059604

## 2024-01-22 MED ADMIN — POLYETHYLENE GLYCOL 3350 17 G PO PACK: 17 g | ORAL | @ 06:00:00 | Stop: 2024-01-23

## 2024-01-22 MED ADMIN — CLONAZEPAM 0.5 MG PO TABS: 1.5 mg | ORAL | @ 06:00:00 | Stop: 2024-01-24 | NDC 50268017311

## 2024-01-22 MED ADMIN — IPRATROPIUM-ALBUTEROL 0.5-2.5 (3) MG/3ML IN SOLN: 3 mL | RESPIRATORY_TRACT | @ 05:00:00 | Stop: 2024-01-22

## 2024-01-22 MED ADMIN — FUROSEMIDE 10 MG/ML IJ SOLN: 40 mg | INTRAVENOUS | @ 04:00:00 | Stop: 2024-01-22 | NDC 71288020304

## 2024-01-22 MED ADMIN — SENNOSIDES 8.6 MG PO TABS: 1 | ORAL | @ 19:00:00 | Stop: 2024-01-22 | NDC 00904725261

## 2024-01-22 NOTE — Consults
 IP CM ACTIVE DISCHARGE PLANNING  Department of Care Coordination      Admit [phone]  Anticipated Date of Discharge: 01/24/2024    Following FI:Rnnx, Rocky LABOR., MD    Today's Short Update   01/22/2024 1647: patient admitted from [facility], DCP pending      Disposition   Expected Disposition: Assisted Living Facility  Post-Acute Recommendations: Pending plan  Discharge Address: [facility]: [address] [place], [place] [address]  Family/Support System in agreement with the current discharge plan: Yes, in agreement and participating           Multidisciplinary Team Member Plan of Care   Interdisciplinary rounds were conducted with the multidisciplinary team including the clinical social worker and nurse case production designer, theatre/television/film. The patient's plan of care and discharge plan were discussed and formulated based on the patient's specific needs.                                        Marval Harpin, BSN RN CCM 01/22/2024   Senior Clinical Case Manager, Geriatric Unit, Department of Care Coordination and Clinical Social Work     Office: [phone]                           Fax: [phone]                                      Pager: [phone]

## 2024-01-22 NOTE — End of Shift Summary
 Patients Clinical Goal:   Clinical Goal(s) for the Shift: vss, safety, comfort  Identify possible barriers to advancing the care plan:   Stability of the patient: Moderately Unstable - medium risk of patient condition declining or worsening    End of Shift Summary:   pt slept for several hours; A/Ox4, forgetful, pleasant. HOH.     Remained on 2L O2, dyspnea with exertion.    SR/ST on tele with [facility].    No BM, continent of urine with urgency.   Fair appetite  []  Echo. Continue cftx/doxy. S/p lasix  iv 40mg  x1. Trending trop.     Vitals:    01/21/24 1706 01/21/24 1941 01/21/24 2018 01/21/24 2245   BP:  117/68 116/65 102/71   Pulse:  98  84   Resp:  17  19   Temp:  36.3 ?C (97.4 ?F)  36.6 ?C (97.8 ?F)   TempSrc:  Temporal  Temporal   SpO2:  93%  93%   Weight:    61 kg (134 lb 6.4 oz)   Height: 1.626 m (5' 4'')

## 2024-01-22 NOTE — End of Shift Summary
 Patient's Clinical Goal:   Clinical Goal(s) for the Shift: avoid falls  Identify possible barriers to advancing the care plan: none  Stability of the patient: Moderately Unstable - medium risk of patient condition declining or worsening    Progression of Patient's Clinical Goal:     No acute changes.   Patient AOX4 forgetful, on 2L NC cont pulse ox, NSR on tele  Pain: no c/o pain   Nutrition: fair PO intake    GI: Last BM 11/26  GU: Voiding regularly, OOB to bedside commode   Skin: intact   Mobility: BMAT 3, weak and unsteady with FWW, one person assist   Lines: 22G RAC   Plan: DC home when medically stable   Will endorse patient care to night shift RN.

## 2024-01-22 NOTE — Progress Notes
 GERIATRICS PROGRESS NOTE   Patient: [name]  MRN: [id number]  Date of Service: 01/22/2024  Admission Date: 01/21/2024    Hospital Day: 1  PCP: [name], MD  Attending Physician: Bluford Rocky LABOR., MD  Primary Resident: Doyal Cheron Hosteller    CHIEF COMPLAINT   No chief complaint on file.  dyspnea  SUBJECTIVE/INTERVAL EVENTS   88 y.o. female with PMHx of COPD, HFimpEF (EF 35-40%, improved to 40-45% in 4/24), bilateral breast cancer, myasthenia gravis, DM2 with CKD stage 4, fibromyalgia, IBS     Patient admitted d/t worsening hypoxemia noted during PCP appointment yesterday. Today, patient reports worsening SOB x2 mos that worsened x2 weeks ago, notably when walking across her apartment and needing t use her hoem oxygen more frequently and for tasks she normally doesn't need. She has also noticed a cough and L sided chest tightness which she believes is related to the cough x2 weeks. She uses home O2 prn at baseline; however she reports an increased need for it recently. Of note, she was unaware she ran out of oxygen recently and kept using the nasal cannula- did not notice there wasn't any O2 being delivered. Two days ago, she started noticing a productive cough with yellow-green sputum.     Reports she took Lasix  x3 days last week with some sx improvement. She does not know the names of the other medications she is taking since her ALF provides them for her daily- says the names of her medications ''are all in the chart.'' Reports many medications give her diarrhea which she treats with imodium .    Patient is a [x]  reliable []  unreliable/limited historian.   If patient is deemed unreliable/limited, main reason(s) is/are []  altered mental status []  cognitive impairment and additional information was obtained from the following independent historian(s):       GERIATRIC ASSESSMENT     Geriatric-Specific Review of Systems:  Falls:   Nutrition:   Weight Loss: none  Swallowing:   Dentures:   Baseline Cognition: stable  Incontinence: denies   Constipation: denies  Depression/Anxiety:   Hearing:   Vision:     Social History:  With whom does the patient live:     [x]  alone          []  adult child:   []  spouse or partner       []  other, specify:     Which best describes the patient's residence:    []  single family house    []  condo/apartment    [x]  board and care (B&C) or assisted living facility(ALF)  []  memory unit at ALF   []  nursing home   []  independent senior housing  []  need to navigate stairs?     Caregiver:    [x]  employed caregiver; if yes, indicate days/hours available: 2x/week   []  family caregiver; if yes, indicate days/hours available:   []  none    Occupation/past occupation:     Functional Status (prior to acute illness):   Source/s of Information: [x] Patient  [] Other:              Relationship:     MOBILITY    Walking across the room [] unaided    [] cane              [x] walker [] handheld    [] wheelchair [] motorized scooter [] unable    Climbing a flight of stairs [] independent  [] needs help [] unable [] n/a   ACTIVITIES OF DAILY LIVING Independent Needs help Who Helps  Feeding oneself [x]  []       Transferring  (getting from bed to chair) [x]  []       Getting to the toilet [x]  []       Toileting  [x]  []       Getting dressed [x]  []       Bathing or showering [x]  []     INSTRUMENTAL ACTIVITIES OF DAILY LIVING        Using the telephone [x]  []       Taking medications []  [x]  ALF     Preparing meals [x]  []       Managing money (paying bills, taxes, banking) []  [x]  daughter     Moderately strenuous housework  []  [x]  caregiver     Doing laundry []  [x]  Market Researcher for personal items or groceries []  []       Driving []  [x]       Getting to places beyond walking distance    (taking a bus, cab, or car) []  [x]       MEDICATIONS       Allergies:  Beta Adrenergic Blockers  Levofloxacin  Macrolides And Ketolides  Sulfa Antibiotics  Botulinum Toxins  Pollen Extract  Statins  Plastic Tape    Home Medications:  No outpatient medications have been marked as taking for the 01/21/24 encounter [facility] Encounter).       Current Medications:  Scheduled:  aspirin , 81 mg, Oral, Daily  azaTHIOprine , 75 mg, Oral, Daily  buPROPion  (SR), 150 mg, Oral, BID  cefTRIAXone , 1 g, IV Push, Q24H  clonazePAM , 1.5 mg, Oral, QHS  doxycycline  hyclate, 100 mg, Oral, Q12H  DULoxetine , 60 mg, Oral, Daily  exemestane , 25 mg, Oral, Daily  fluticasone -salmeterol, 2 puff, Inhalation, BID  heparin , 5,000 Units, Subcutaneous, Q12H  influenza vaccine injection (36 months of age and up), 1 syringe, Intramuscular, Once  ipratropium-albuterol , 3 mL, Nebulization, Q4H while awake  metoprolol  succinate, 25 mg, Oral, Daily  montelukast , 10 mg, Oral, QPM  pantoprazole , 40 mg, Oral, Daily  polyethylene glycol, 17 g, Oral, Daily  predniSONE , 40 mg, Oral, Daily  pyRIDostigmine , 60 mg, Oral, Q8H        PRN:  sodium chloride   Medications 01/21/24 01/22/24     sodium chloride  0.9% IV soln  Dose: 5 mL/hr  Freq: As needed for Route: IV  PRN Comment: tko  Start: 01/21/24 1921 End: 02/20/24 1920     Order specific questions:   Nurse Protocol Document [email or web address]                     Infusions:         OBJECTIVE     Temp:  [36.3 ?C (97.3 ?F)-37.2 ?C (98.9 ?F)] 36.4 ?C (97.6 ?F)  Heart Rate:  [75-98] 86  Resp:  [16-20] 16  BP: (93-117)/(26-71) 97/46  NBP Mean:  [46-80] 63  SpO2:  [93 %-97 %] 97 %       IN'S AND OUT'S:  UOP: Urine:  [200 mL-500 mL] 400 mL   I/Os:   Intake/Output Summary (Last 24 hours) at 01/22/2024 1302  Last data filed at 01/22/2024 1045  Gross per 24 hour   Intake 575 ml   Output 1550 ml   Net -975 ml           System Check if within normal limits Positive or additional negative findings   Constit  [x]  General appearance  NAD. Euvolemic on examination   Eyes  []  Conj/Lids  []  Pupils   []  Fundi  HENMT  []  External ears/nose    [x]  Gross Hearing   []  Lips/teeth/gums    []  Mucus membranes []  Otoscopy     []  Nasal mucosa   []  Oropharynx    []  Head       Neck  []  Inspection/palpation   []  Thyroid     Resp  [x]  Effort    [x]  Auscultation    []  Wheezing   []  Crackles     Mild wheezing appreciated  Mildly dsypneic with talkign   CV  [x]  Rhythm/rate   []  Murmurs   []  LE edema   []  JVP non-elevated No LE edema noted    Normal pulses:   []  Radial            []  Femoral       []  Pedal    Breast  []  Inspection []  Palpation     GI  [x]  Bowel sounds    [x]  Soft   []  Nontender   [x]  Nondistended   [x]  Rebound/Guarding   []  Liver/spleen   []  Rectal  Mild tenderness noted to LLQ   GU  M:    []  Scrotum    []  Penis   []  Prostate  []  Foley catheter in place   []  Suprapubic tube in place     F:     []  External   []  Cervix     []  Uterus       []  Vaginal wall  []  Mucus  []  Adnexa    Lymph  []  Neck  []  Axillae []  Groin     MSK Specify site examined:         [x]  Inspect/palp   []  Stability   []  Gait        []  ROM   []  Strength/tone   []  Back    Skin  []  Inspection   []  Palpation     Neuro  []  CN2-12 intact grossly   [x]  Alert and oriented   []  DTR      []  Muscle strength      []  Sensation   []  Balance     Psych  [x]  Insight/judgement     [x]  Mood/affect    []  Gross cognition          CAM DELIRIUM TEST:    Feature One:   A. Acute Change in mental status from baseline? []  yes [x]  no   B. Is there fluctuation in the mental status? []  yes [x]  no    Feature Two:  A. Does that patient have difficulty focusing attention?  []  yes [x]  no    Feature Three:  A.Is there evidence of disorganized thinking?  []  yes [x]  no    Feature Four:  A. Does the patient have an altered level of consciousness (ie: vigilant, lethargic, stuporous, comatous?  []  yes [x]  no    The diagnosis of delirium by CAM requires the presence of features 1 and 2 and either 3 or 4.     LABS: Labs reviewed today  CBC  Recent Labs     01/22/24  0604 01/21/24  1350   WBC 9.67 8.21   HGB 12.9 12.5   HCT 41.0 38.4   MCV 99.0* 98.2   PLT 351 365     BMP  Recent Labs 01/22/24  0604 01/21/24  1445   NA  --  139 ~ 138   K  --  5.1 ~ 5.2   CL  --  99 ~  101   CO2  --  23 ~ 28   BUN  --  23* ~ 24*   CREAT  --  1.48* ~ 1.54*   CALCIUM   --  9.2 ~ 9.4   MG 1.7  --    PHOS 3.8  --      LFT  Recent Labs     01/21/24  1445   TOTPRO 7.0   ALBUMIN 4.1   BILITOT 0.2   ALT 9   AST 18   ALKPHOS 76     Coags  No results for input(s): ''INR'', ''PT'', ''APTT'' in the last 72 hours.  UA:  No results for input(s): ''SPECGRAVUR'', ''PHUR'', ''BLDUR'', ''KETONESUR'', ''GLUCOSEUR'', ''PROTCLUR'', ''NITRITEUR'', ''LEUKESTUR'', ''RBCSUR'', ''WBCSUR'' in the last 72 hours.        IMAGING: Imaging reviewed today    XR chest ap portable (1 view)  Result Date: 01/21/2024  IMPRESSION: Stable cardiac silhouette. Calcification of the thoracic aorta. Bands of atelectasis or scar within the lung bases. Mild bronchial wall thickening. No focal consolidation. No gross effusions. Degenerative changes of the spine. Signed by: Rollo Daring   01/21/2024 5:41 PM      MICROBIOLOGY:    Recent Results (from the past 24 hours)   Bacterial Culture Blood    Collection Time: 01/21/24  2:45 PM    Specimen: Peripheral Vein #2; Blood   Result Value Ref Range    Blood Culture - Preliminary status Negative To Date Negative   Bacterial Culture Blood    Collection Time: 01/21/24  2:45 PM    Specimen: Peripheral Vein; Blood   Result Value Ref Range    Blood Culture - Preliminary status Negative To Date Negative   Respiratory Pathogen Panel PCR, Respiratory Upper    Collection Time: 01/21/24  8:04 PM    Specimen: Nasopharyngeal; Respiratory, Upper   Result Value Ref Range    Specimen Type Respiratory, Upper     Adenovirus Not Detected Not Detected    Seasonal Coronavirus (NOT COVID-19) Not Detected Not Detected    Human Metapneumovirus Not Detected Not Detected    Human Rhinovirus/Enterovirus Not Detected Not Detected    Influenza A Not Detected Not Detected    Influenza A H1 Not Detected Not Detected    Influenza A H1-2009 Not Detected Not Detected Influenza A H3 Not Detected Not Detected    Influenza B Not Detected Not Detected    Parainfluenza Virus Type 1 Not Detected Not Detected    Parainfluenza Virus Type 2 Not Detected Not Detected    Parainfluenza Virus Type 3 Not Detected Not Detected    Parainfluenza Virus Type 4 Not Detected Not Detected    Respiratory Syncytial Virus A Not Detected Not Detected    Respiratory Syncytial Virus B Not Detected Not Detected    Chlamydia pneumoniae Not Detected Not Detected    Mycoplasma pneumoniae Not Detected Not Detected   COVID-19 PCR, Nasopharyngeal    Collection Time: 01/21/24  8:04 PM    Specimen: Nasopharyngeal; Respiratory, Upper   Result Value Ref Range    Specimen Type Respiratory, Upper     COVID-19 PCR/TMA Not Detected Not Detected       INDEPENDENT INTERPRETATION OF TESTS:    Wt Readings from Last 10 Encounters:   01/22/24 59.9 kg (132 lb)   01/21/24 61.7 kg (136 lb)   12/03/23 62.2 kg (137 lb 3.2 oz)   10/31/23 63 kg (139 lb)   10/09/23 63 kg (139 lb)   09/24/23 62.1  kg (137 lb)   09/04/23 62 kg (136 lb 9.6 oz)   08/08/23 64.9 kg (143 lb)   07/25/23 63.2 kg (139 lb 4.8 oz)   07/25/23 63.4 kg (139 lb 12.8 oz)         ASSESSMENT AND PLAN       [name] is a 88 y.o. female admitted for <principal problem not specified>    Acute Problems:  #acute on chronic hypoxemic respiratory failure  #COPD exacerbation  #productive cough, acute  Patient with chronic prn oxygen use with worsening DOE and increased oxygen use x2 weeks. New cough also noticed x2 weeks ago with sputum noticed x2d ago. BNP appears to be at baseline. Procalcitonin normal. Most concerning for COPD exacerbation given worsening productive cough and increased SOB. Also concerning for CHF exacerbation, however CXR w/out signs of fluid overload and Pt euvolemic on physical exam without weight gain per chart review. Pt also with history of MG- considered exacerbation, however no signs of muscle fatigue and patient tolerating pyridostigmine . Considered PE d/t elevated D-dimer, tachycardia, and use of exemestane , however patient w/ D-dimer previously elevated and no concerns for recent immobility; Wells score with low PE probability.  - s/p lasix  40mg  IV x1  - fluticasone -salmeterol inhaler BID (home inhaler not on formulary)  - Continue prednisone  40mg  daily x 5 days  - Continue ceftriaxone  and doxycycline  x5 days  - Continue home inhalers and duo nebs q4h  - repeat PFTs outpatient  - RT to do BID NIFs    #HFrEF--EF 20%  Last TTE 06/2023, EF 22% with severely reduced systolic function. Biventricular pacemaker placement scheduled for 03/03/2024  - TTE repeated 11/26, f/u results  - hold home lasix  20mg  today, restart 11/27. Lasix  40mg  IV given earlier today  - cont metoprolol  succinate 25mg  daily  - restart losartan  100mg  and home farxiga  10mg  qd    #CKD4  Cr 1.48, GFR 33 seems to be around baseline  - restart losartan       Chronic Problems:  #Myesthenia Gravis  Well controlled per outpatient notes. On ultomiris q4 weeks  - cont home pyridostigmine  60mg  TID  - on azathioprine  (unclear if dose 75mg  daily or 150mg  daily) > pharmacy med rec  - f/u NIF    #Bilateral breast cancer - invasive ductal carcinoma with lobular features ER (+)/ HER 2 (-)   Seen by outpatient heme onc. Diagnosed on screening mammo in 06/2017. At the time, elected to not have surgery. Was on tamoxifen > anastrozole  for a period of time but discontinued endocrine therapy due to side effects. 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 Opted against surgery d/t anesthesia risk from MG. Discussed case with rad/onc for radiation therapy, but pt not currently a candidate for RT. Previously on anastrazole in 2023 but stopped d/t diarrhea  - Continue exemestane  25mg  daily, started 06/2023    #HTN  Stable  -Continue Metoprolol  25mg  every day and losartan     #major depression  #chronic benzo use   - continue clonazepam  1.5mg  qhs, duloxetine  60mg  daily, and bupropion  150mg  BID    #CAD  H/o declining stent  -f/u outpatient    #Cervical myelopathy  Monitored outpatient by neuro  - CTM    #GERD  s/p Nisan fundoplication  - cont home pantoprazole  40mg  daily       Prophylaxis:   VTE prophylaxis: heparin   GI prophylaxis: pantoprazole     CODE STATUS: DNR (No CPR, defibrillation, intubation)    Current Ambulatory Status &  Fall Risk:  []  non-ambulatory  [x]  walks with cane and/or walker  []  walks independently  []  walks independently but may benefit from an assistive device evaluation with physical therapy  []  needs supervision with walking secondary to poor safety awareness    SDoH  The diagnosis or treatment of said conditions is significantly limited by the following social drivers of health:  []  Z59.0 Homelessness  []  Z59.1 Inadequate housing  []  Z60.2 Problems related to living alone  []  Z59.7 Insufficient social insurance and welfare support  []  Z59.9 Problems related to housing and economic circumstances, unspecified  []  Z62.820 Parent-biological child conflict  []  Z63.8 Other specified problems related to primary support group  []  Z55.0 Illiteracy and low level literacy     Discharge Planning Considerations:  []  Physical Therapy (PT)/Occupational Therapy (OT) consult inpatient to evaluate for Durable Medical Equipment (DME) and other adaptive home equipment recommendations, or to assess if appropriate for Skilled Nursing Facility (SNF) or Acute Rehab Unit (ARU) for short-term rehabilitation  []  Social Work consult to evaluate home support, caregiver resources, social barriers to discharge, or if appropriate, for custodial/long-term nursing home (NH) placement or new Assisted Living Facility (ALF) or Board and Care (B&C) placement, or non-medical transport upon discharge  [x]  Case Management consult for Home Health needs, home IV antibiotics, above Durable Medical Equipment (DMEs), Skilled Nursing Facility (SNF) placement for short-term, skilled services, Acute Rehab Unit (ARU) placement, or ambulance transport upon discharge    Anticipated Discharge Site:  []  Return home independently  []  Return home with personal care assistance by family or employed caregiver  [x]  Return to Assisted Living Facility (ALF) or Board & Care (B&C)  []  Return to Nursing Home (NH) for custodial/long-term care  []  Skilled Nursing Facility (SNF) for short-term, post-acute skilled services (rehab, IV therapy, complex wound care, or new G-tube management)  []  Acute Rehab Unit (ARU)  []  To be determined, pending clinical course     Anticipated Discharge Needs:  [x]  Home Health [RN with or without Physical Therapy, Occupational Therapy, bath aide, Medical Child Psychotherapist (MSW)]  []  Horticulturist, Commercial (DME) for home discharges (if patient does not already own one):               []  cane              []  home O2      []  enteral pump & formula          []  other:               []  walker           []  nebulizer       []  suction machine                     []  none or to be determined               []  wheelchair    []  BiPap            []  wound vac                []  commode     []  pneumovest  []  low air mattress (for pressure injuries stage 3 or worse)               []  hospital bed  []  hoyer lift        []  pleurX  []  Skilled Nursing Facility (SNF) placement for short-term, post-acute skilled  services  []  Acute Rehab Unit (ARU) placement  []  New nursing home (NH) placement for custodial/long-term care  []  New Assisted Living Facility (ALF) or Board and Care (B&C) placement  []  Personal care assistance by family or employed caregiver  []  None     Findings and recommended plan of care discussed with patient/surrogate:   Extended Emergency Contact Information  Primary Emergency Contact: [facility]  Mobile Phone: [phone]  Relation: Other  Secondary Emergency Contact: Beutner,Virgina  Address: [address]           [place], [place] [address] [place]  Home Phone: [phone]  Mobile Phone: [phone]  Relation: Daughter  Preferred language: English    Patient will be discussed with geriatrics attending Dr. Rocky Ahle.    Doyal Cheron Hosteller      This note was authored by psychologist, occupational, Doyal Cheron Hosteller, at [place]. This note should not be consulted for clinical, billing, or medico-legal purposes unless a physician has reviewed this note and added an attestation below regarding the accuracy of above documentation.    The resident above was physically present with the medical student during key elements of history, exam, and medical decision making as above. I personally performed a face-to-face encounter with [name] including exam and medical decision making and I agree with plan above.  I have verified the combined documentation above as accurate with my edits and additions   Emir Nack A. Ahle, MD

## 2024-01-23 DIAGNOSIS — R0602 Shortness of breath: Secondary | ICD-10-CM

## 2024-01-23 LAB — Basic Metabolic Panel
ANION GAP: 9 mmol/L (ref 8–19)
ESTIMATED GFR 2021 CKD-EPI: 34 mL/min/1.73m2 (ref 20–30)

## 2024-01-23 LAB — Differential Automated: ABSOLUTE BASO COUNT: 0.02 x10E3/uL (ref 0.00–0.10)

## 2024-01-23 LAB — CBC: HEMOGLOBIN: 10.9 g/dL — ABNORMAL LOW (ref 11.6–15.2)

## 2024-01-23 LAB — Magnesium: MAGNESIUM: 1.7 meq/L (ref 1.4–1.9)

## 2024-01-23 LAB — Phosphorus: PHOSPHORUS: 3.9 mg/dL (ref 2.5–4.5)

## 2024-01-23 LAB — eGFR by Cystatin C and Creatinine: GFR ADDITIONAL INFORMATION: 26 mL/min/1.73m2 (ref 0.60–1.30)

## 2024-01-23 MED ORDER — TIOTROPIUM BROMIDE MONOHYDRATE 18 MCG IN CAPS
18 ug | ORAL_CAPSULE | Freq: Every day | RESPIRATORY_TRACT | 0 refills | 30.00000 days | Status: AC
Start: 2024-01-23 — End: 2024-01-24

## 2024-01-23 MED ORDER — SPIRIVA RESPIMAT 1.25 MCG/ACT IN AERS
2 | Freq: Every day | RESPIRATORY_TRACT | 0 refills | 30.00000 days | Status: AC
Start: 2024-01-23 — End: 2024-01-24

## 2024-01-23 MED ORDER — TIOTROPIUM BROMIDE 18 MCG IN CAPS
18 ug | ORAL_CAPSULE | Freq: Every day | RESPIRATORY_TRACT | 0 refills | 30.00000 days | Status: AC
Start: 2024-01-23 — End: 2024-01-24
  Filled 2024-01-25: qty 4, 30d supply, fill #0

## 2024-01-23 MED ORDER — COMBIVENT RESPIMAT 20-100 MCG/ACT IN AERS
1 | Freq: Four times a day (QID) | RESPIRATORY_TRACT | 0 refills | 30.00000 days | Status: AC
Start: 2024-01-23 — End: 2024-01-24

## 2024-01-23 MED ORDER — PREDNISONE 20 MG PO TABS
40 mg | ORAL_TABLET | Freq: Every day | ORAL | 0 refills | 3.00000 days | Status: AC
Start: 2024-01-23 — End: 2024-01-24
  Filled 2024-01-25: qty 6, 3d supply, fill #0

## 2024-01-23 MED ORDER — COMBIVENT RESPIMAT 20-100 MCG/ACT IN AERS
2 | Freq: Every day | RESPIRATORY_TRACT | 0 refills | 60.00000 days | Status: AC
Start: 2024-01-23 — End: 2024-01-24

## 2024-01-23 MED ADMIN — DOXYCYCLINE HYCLATE 100 MG PO TABS: 100 mg | ORAL | @ 03:00:00 | Stop: 2024-01-24 | NDC 50268027911

## 2024-01-23 MED ADMIN — DOXYCYCLINE HYCLATE 100 MG PO TABS: 100 mg | ORAL | @ 16:00:00 | Stop: 2024-01-24 | NDC 50268027911

## 2024-01-23 MED ADMIN — MONTELUKAST SODIUM 10 MG PO TABS: 10 mg | ORAL | @ 02:00:00 | Stop: 2024-01-24

## 2024-01-23 MED ADMIN — PREDNISONE 20 MG PO TABS: 40 mg | ORAL | @ 16:00:00 | Stop: 2024-01-24 | NDC 60687014511

## 2024-01-23 MED ADMIN — IPRATROPIUM-ALBUTEROL 0.5-2.5 (3) MG/3ML IN SOLN: 3 mL | RESPIRATORY_TRACT | @ 14:00:00 | Stop: 2024-01-24

## 2024-01-23 MED ADMIN — IPRATROPIUM-ALBUTEROL 0.5-2.5 (3) MG/3ML IN SOLN: 3 mL | RESPIRATORY_TRACT | @ 02:00:00 | Stop: 2024-01-24 | NDC 60687040579

## 2024-01-23 MED ADMIN — IPRATROPIUM-ALBUTEROL 0.5-2.5 (3) MG/3ML IN SOLN: 3 mL | RESPIRATORY_TRACT | @ 18:00:00 | Stop: 2024-01-24 | NDC 60687040579

## 2024-01-23 MED ADMIN — IPRATROPIUM-ALBUTEROL 0.5-2.5 (3) MG/3ML IN SOLN: 3 mL | RESPIRATORY_TRACT | @ 05:00:00 | Stop: 2024-01-24 | NDC 60687040579

## 2024-01-23 MED ADMIN — IPRATROPIUM-ALBUTEROL 0.5-2.5 (3) MG/3ML IN SOLN: 3 mL | RESPIRATORY_TRACT | @ 21:00:00 | Stop: 2024-01-24 | NDC 60687040579

## 2024-01-23 MED ADMIN — INFLUENZA VAC SPLIT HIGH-DOSE 0.5 ML IM SUSY: 1 | INTRAMUSCULAR | @ 20:00:00 | Stop: 2024-01-24

## 2024-01-23 MED ADMIN — FLUTICASONE-SALMETEROL 100-50 MCG/ACT IN AEPB: 2 | RESPIRATORY_TRACT | @ 18:00:00 | Stop: 2024-01-24 | NDC 00054032656

## 2024-01-23 MED ADMIN — FLUTICASONE-SALMETEROL 100-50 MCG/ACT IN AEPB: 2 | RESPIRATORY_TRACT | @ 05:00:00 | Stop: 2024-01-24 | NDC 00054032656

## 2024-01-23 MED ADMIN — FUROSEMIDE 20 MG PO TABS: 20 mg | ORAL | @ 16:00:00 | Stop: 2024-01-24

## 2024-01-23 MED ADMIN — FUROSEMIDE 20 MG PO TABS: 20 mg | ORAL | @ 02:00:00 | Stop: 2024-01-24

## 2024-01-23 MED ADMIN — EXEMESTANE 25 MG PO TABS: 25 mg | ORAL | @ 16:00:00 | Stop: 2024-01-24 | NDC 59762285801

## 2024-01-23 MED ADMIN — METOPROLOL SUCCINATE ER 25 MG PO TB24: 25 mg | ORAL | @ 16:00:00 | Stop: 2024-01-24

## 2024-01-23 MED ADMIN — PYRIDOSTIGMINE BROMIDE 60 MG PO TABS: 60 mg | ORAL | Stop: 2024-01-24 | NDC 68084049411

## 2024-01-23 MED ADMIN — PYRIDOSTIGMINE BROMIDE 60 MG PO TABS: 60 mg | ORAL | @ 07:00:00 | Stop: 2024-01-24 | NDC 68084049411

## 2024-01-23 MED ADMIN — PYRIDOSTIGMINE BROMIDE 60 MG PO TABS: 60 mg | ORAL | @ 01:00:00 | Stop: 2024-01-24 | NDC 68084049411

## 2024-01-23 MED ADMIN — PYRIDOSTIGMINE BROMIDE 60 MG PO TABS: 60 mg | ORAL | @ 16:00:00 | Stop: 2024-01-24 | NDC 68084049411

## 2024-01-23 MED ADMIN — PANTOPRAZOLE SODIUM 40 MG PO TBEC: 40 mg | ORAL | @ 16:00:00 | Stop: 2024-01-24 | NDC 60687073609

## 2024-01-23 MED ADMIN — HEPARIN SODIUM (PORCINE) 5000 UNIT/ML IJ SOLN: 5000 [IU] | SUBCUTANEOUS | @ 16:00:00 | Stop: 2024-01-24 | NDC 00409272330

## 2024-01-23 MED ADMIN — HEPARIN SODIUM (PORCINE) 5000 UNIT/ML IJ SOLN: 5000 [IU] | SUBCUTANEOUS | @ 05:00:00 | Stop: 2024-01-24 | NDC 00409272330

## 2024-01-23 MED ADMIN — CEFTRIAXONE 1 G IN SWFI IVP: 1 g | INTRAVENOUS | @ 03:00:00 | Stop: 2024-01-24 | NDC 00409733211

## 2024-01-23 MED ADMIN — ASPIRIN 81 MG PO TBEC: 81 mg | ORAL | @ 16:00:00 | Stop: 2024-01-24 | NDC 77333003125

## 2024-01-23 MED ADMIN — DULOXETINE HCL 60 MG PO CPEP: 60 mg | ORAL | @ 16:00:00 | Stop: 2024-01-24 | NDC 68001059604

## 2024-01-23 MED ADMIN — AZATHIOPRINE 50 MG PO TABS: 75 mg | ORAL | @ 16:00:00 | Stop: 2024-01-24 | NDC 68084022901

## 2024-01-23 MED ADMIN — AZATHIOPRINE 50 MG PO TABS: 75 mg | ORAL | @ 07:00:00 | Stop: 2024-01-24 | NDC 68084022901

## 2024-01-23 MED ADMIN — BUPROPION HCL ER (SR) 100 MG PO TB12: 200 mg | ORAL | @ 16:00:00 | Stop: 2024-01-24 | NDC 68084069711

## 2024-01-23 MED ADMIN — BUPROPION HCL ER (SR) 100 MG PO TB12: 200 mg | ORAL | @ 05:00:00 | Stop: 2024-01-24 | NDC 68084069711

## 2024-01-23 MED ADMIN — CLONAZEPAM 0.5 MG PO TABS: 1.5 mg | ORAL | @ 05:00:00 | Stop: 2024-01-24 | NDC 60687086111

## 2024-01-23 MED ADMIN — DAPAGLIFLOZIN PROPANEDIOL 10 MG PO TABS: 10 mg | ORAL | @ 16:00:00 | Stop: 2024-01-24 | NDC 00310621039

## 2024-01-23 MED ADMIN — LOSARTAN POTASSIUM 50 MG PO TABS: 100 mg | ORAL | @ 16:00:00 | Stop: 2024-01-24

## 2024-01-23 MED ADMIN — LACTATED RINGERS IV BOLUS: 250 mL | INTRAVENOUS | @ 05:00:00 | Stop: 2024-01-23 | NDC 00338011704

## 2024-01-23 NOTE — Patient Education
 [id number] en   COPD Flare-Up   You have had a flare-up of your COPD.   COPD (chronic obstructive pulmonary disease) is a common lung disease. It causes your airways to get irritated and narrower. This makes it harder for you to breathe. Emphysema and chronic bronchitis are both types of COPD. This is a long-term (chronic) condition. This means you always have it. But how severe it is may vary. When it gets worse, it's called a flare-up.      Symptoms of COPD   People with COPD may have symptoms most of the time. In a flare-up, your symptoms get worse. Symptoms that may mean you are having a flare-up include:   ?  Shortness of breath, shallow or rapid breathing, or wheezing that gets worse.   ?  Lung infection.   ?  A cough that gets worse.   ?  More mucus (or sputum), thicker mucus, or mucus of a different color.   ?  Tiredness, less energy, or trouble doing your routine activities.   ?  Fever.   ?  Chest tightness.   ?  Trouble talking.   ?  You feel confused.       Causes of flare-ups   A flare-up can happen even if you did everything right and followed your health care provider?s instructions. Some causes of flare-ups are:   ?  Cold weather.   ?  Smoking or secondhand smoke.   ?  Use of e-cigarettes or vaping products.   ?  Colds, the flu, or other respiratory infections.   ?  Air pollution.   ?  Sudden change in the weather.   ?  Dust, vapors, gases, irritating chemicals, or strong fumes.   ?  Not taking your medicines as prescribed.   ?  Indoor pollution, such as burning wood, smoke from home cooking, or heating fuels.       Home care   Here are some things you can do at home to treat a flare-up:   ?  Keep calm and try not to panic. This makes it harder to breathe and keeps you from doing the right things.   ?  Don?t smoke or be around others who are smoking. If you smoke, quit. Smoking is the main most common cause of COPD. Quitting will help you manage your COPD. Don't use e-cigarettes or vaping products either. Ask your health care provider about ways to help you quit smoking.   ?  Before drinking extra fluids during flare-ups to loosen the mucus, always talk with your healthcare provider first.   ?  Eat a healthy, balanced diet. It is important to stay as healthy as possible. So is trying to stay at your ideal weight. Being overweight or underweight can affect your health. Make sure you have a lot of fruits and vegetables every day. And also eat balanced portions of whole grains, lean meats and fish, and low-fat dairy products.   ?  Use your inhalers and nebulizer, if you have one, as you have been told to. When using a metered dose inhaler or nebulizer, it's very important to use the correct methods. If you have any questions about how to use your device, contact your health care provider or refer to the user manual.   ?  If you were given antibiotics, take them until they are used up or your provider tells you to stop. It?s important to finish the antibiotics even though you feel better. This will  make sure the infection has cleared.   ?  If you were given a steroid, use it for the length of time prescribed. This is important even if you feel better.   ?  Learn the names of your medicines. And learn how and when to use them. Talk with your provider about other conditions you have, their treatment, and how it may affect your COPD.   ?  Oxygen may be prescribed if tests show that your blood contains too little oxygen. Ask your provider about long-term oxygen therapy.   ?  Coping tips for shortness of breath include:   o  Exercise. Try to be as active as possible. This will improve energy levels and make your muscles stronger so you can do more. Ask your health care provider what exercises you can safely do.   o  Breathing methods.  Ask your provider or nurse to show you how to do pursed-lip breathing.   o  Balance rest and activity.  Each day, try to balance rest periods with activity. For example, you might start the day by getting dressed and eating breakfast. Then you can relax and read the paper. After that, take a brief walk. And then sit with your feet up for a while. Do the most important tasks when you have the most energy.   o  Pulmonary rehab (rehabilitation).  Community-based and home-based programs work as well as Psychologist, prison and probation services. This is true as long as they are as often and as intense. Standard home-based pulmonary rehab programs help with shortness of breath in people with COPD. Supervised, traditional pulmonary rehab remains the best choice for people with COPD. These programs help with managing your disease and also help with breathing methods, exercise, support, and counseling. To find one, ask your provider or call your local hospital. Talk with your provider about which rehab or self-management program is best for you.       Preventing a flare-up   Flare-ups happen. But the best way to treat a flare-up is to prevent it before it starts. Here are some pointers:   ?  Don?t smoke or be around others who are smoking. Don't use e-cigarettes due to their harmful side effects.   ?  Take your medicines as discussed with your health care provider.   ?  Stay up-to-date on all vaccines. Talk with your provider about getting a flu shot every year. Also find out if you need a pneumonia shot.   ?  If there is a weather advisory warning to stay indoors, try to stay inside when possible.   ?  Try to eat healthy foods, exercise, and get plenty of sleep.   ?  Try to stay away from things that normally cause your symptoms. These include dust, chemical fumes, hairsprays, or strong perfumes.       Follow-up care   Follow up with your health care provider as advised.   If a culture was done, you will be told if your treatment needs to be changed. You can call as directed for the results.   If X-rays were done, you will be told of any new findings that may affect your care.   During each appointment, talk with your provider about your ability to:   ?  Cope in your normal environment.   ?  Correctly use the inhaler (or your medicine delivery systems).   ?  Cope with other conditions you have and their treatments and how they may affect  your COPD.       Call 911   Call 911 if you:   ?  Wheeze or have shortness of breath that does not get better with treatment.   ?  Have chest pain or chest tightness.   ?  Feel lightheaded or dizzy.   ?  Have trouble breathing.   ?  Feel confused or it?s hard to wake you up.   ?  Faint or lose consciousness.   ?  Have a rapid heart rate.   ?  Have new pain in your chest, arm, shoulder, neck, or upper back.   ?  Have blue lips or skin.       When to get medical advice   Call your health care provider right away if you:   ?  Have a fever of 100.4?F (38?C) or higher, or as directed by your provider.   ?  Cough up lots of dark-colored or bloody mucus (sputum).   ?  Don't start to get better within 24 hours, or new symptoms start.   ?  Have swelling of your ankles that gets worse.   ?  Are weak.     Last Reviewed Date: 02/27/2023 00:00:00   ? 2000-2025 [employer], [place]. All rights reserved. This information is not intended as a substitute for professional medical care. Always follow your healthcare professional's instructions.

## 2024-01-23 NOTE — End of Shift Summary
 Patient's Clinical Goal:   Clinical Goal(s) for the Shift: vss, safety, comfort  Identify possible barriers to advancing the care plan:   Stability of the patient: Moderately Unstable - medium risk of patient condition declining or worsening    Progression of Patient's Clinical Goal: Patient oriented x4, able to verbalize needs. Patient is on cardiac monitor. NSR on the monitor. Patient is on continuous pulse oximetry. Denies SOB or chest discomfort. Adequate O2 Sat on 2LNC. VSS. Remains afebrile. Up to toilet. Bmx0. NS bolus given for low BP. No acute distress noted. Call light within reach, bed alarm on, bed in lowest position. Will cont plan of care.    Vital signs noted Blood pressure 124/51, pulse 82, temperature 36.6 ?C (97.9 ?F), temperature source Temporal, resp. rate 18, height 1.626 m (5' 4''), weight 61.1 kg (134 lb 12.8 oz), SpO2 96%.    GERIATRICS END OF SHIFT - DISCHARGE MARKERS of Instability Checklist  Nursing assessment:     Indicator   Cutoff Check if indicator needs to be addressed (meets cutoff)   Situation/Action/Comment   O2 requirement New since admission []     PO intake Less than 50% []     Urination None in past 8 hours/last shift []     Foley New since admission []     Pain Present  []     Bowel movements  <1 in 2 days or >3 in 24 hours []     Delirium Unable to follow simple commands or participate with PT/OT []     Mobility Unable to stand and/or walk to bathroom []         BOOST / SAFE TRANSITION - DISCHARGE INDICATORS    Indicator Check if indicator has red ''P''    Situation/Action/Comment     Problems with Medications  [x]     Psychological  []     Principal Diagnosis  []     Physical Limitations  []     Poor Health Literacy  [x]     Patient Support  []     Prior Hospitalizations  []     Palliative Care  []       DELIRIUM PREVENTION  Protocols Strategies Check if implemented Comments   Risk factors >3 present- High risk []     Purposeful orientation Reorient, purposeful orienting conversation  Familiar objects in room []   []     Therapeutic activities Volunteer visit  Cognitive stimulation activities: games, reading, music []   []     Vision & hearing Assistance with: leisure centre manager  Assistance with: hearing aids/hearing amplifier []   []     Feeding & hydration Assistance with feeding  Assistance with dentures []   []     Sleep hygiene Shades/blinds/lights on during day, limit naps during day  Quiet environment, consolidate care [x]     Mobilization BMAT 3-4: Ambulate TID  BMAT 2: OOB daily to chair for meals <= 2 hours, each time  BMAT 1: OOB to cardiac chair daily for meals <= 2 hours, each time [x]   []   []     Pain Non-narcotics ATC  Non-pharmacological: oil/aromatherapy, massage, music []   []     Maintain safety Fall precautions, volunteer visit, family at bedside, tele sitter, constant observer [x]     Manage agitation Redirect with calm, gentle voice and avoid confrontation  Avoid restraints and use alternative to restraints  Doll, music or animal therapy, as appropriate  Volunteer for companionship if safe and appropriate []   []   []   []       DELIRIUM: CAM (+)  Protocols Strategies Check if implemented Comments  New-onset MD contacted  Delirium order-set initiated  Bladder scan to R/O retention  Assess stool impaction  Medication reviewed with pharmacist []   []   []   []   []  MD Name:   Existing Manage and prevent further delirium [] 

## 2024-01-23 NOTE — Discharge Instructions
 A NOTE FROM YOUR DOCTOR    You were admitted for worsening symptoms of Chronic Obstructive Pulmonary Disease (COPD), also called a COPD ?flare? or exacerbation. You improved with medications, oxygen, and breathing treatments.    Please take your medications exactly as prescribed:    Inhalers:  Rescue inhaler (albuterol ): Use 2 puffs every 4-6 hours as needed for shortness of breath or wheezing.  Maintenance inhaler: Continue your daily inhaler(s) as instructed. These help prevent future flare-ups.    Steroids: You were started on a short course of steroids. You will complete this course at home starting 11/28 for 3 more days.    Oxygen: You can continue your oxygen at home as needed.     You received a short course of antibiotics in the hospital. No additional antibiotics are needed when you are discharged. However, if your symptoms start to worsen rather than improve over the next few days, or if you develop fevers, chills, cough, seek urgent medical evaluation.     After discharge, please:  Follow up with the following providers/specialists:  Your primary care provider within 1 week      It is important that you follow up with your doctor(s) in the coming weeks. We have schedulers who will help you make these appointments.  If you do not hear from them or need additional assistance, please call our referral service at [phone]) or go to [email or web address]    If you feel like your illness/symptoms are worsening and need urgent evaluation (i.e. you cannot wait until your follow up visits or your primary care physician cannot see you):   You can get same day medical attention at an urgent care (find a [place] urgent care office at [email or web address])  If you need more serious immediate care, please return to the ER.      Thank you,  [facility]

## 2024-01-23 NOTE — Consults
 IP CM ACTIVE DISCHARGE PLANNING  Department of Care Coordination      Admit [phone]  Anticipated Date of Discharge: 01/24/2024    Following FI:Rnnx, Rocky LABOR., MD    Today's Short Update   11/27: CM acknowledged HH orders and referrals were tasked via [facility].      Disposition   Expected Disposition: Assisted Living Facility  Post-Acute Recommendations: Nursing  Discharge Address: [facility]: [address] [place], [place] [address]  Family/Support System in agreement with the current discharge plan: Yes, in agreement and participating      Multidisciplinary Team Member Plan of Care   Interdisciplinary rounds were conducted with the multidisciplinary team including the clinical social worker and nurse case production designer, theatre/television/film. The patient's plan of care and discharge plan were discussed and formulated based on the patient's specific needs.    Home Health Coordination Status   Choices presented to patient (5/7), Barrier: Family deciding, Referral sent-out to providers (via [facility]) (3/7), Order written (2/7)    Pearson Selinda Berlinda Terrea,  01/23/2024  Pearson Selinda Terrea MSN, RN  Clinical Case Manager  Department of Care Coordination and Clinical Social Work  [facility]  [address] [place], [place] [address]  Phone: [phone]  Pager: [phone]

## 2024-01-23 NOTE — Discharge Summary
 Discharge Summary           Name: [name] MRN: [id number] DOB: February 04, 1936   Admit Date: 01/21/2024   D/C Date: 01/23/2024 LOS:  LOS: 2 days    Admitting Attending Ronnald FERNS. Laurence, MD Discharge Attending No att. providers found   PCP [name], MD Discharge Provider Vernell HERO. Swier, MD     Inpatient Treatment Team Treatment Team:   Team: [facility], Geriatric[facility] -  Primary - Resident: Swier, Vernell HERO., MD  Case Manager: Terrea Pearson Selinda Berlinda     Consultants NA   Outpatient Care Team No care team member to display     Admission Diagnosis (reason for admission) Discharge Diagnosis (conditions treated during hospitalization)   SHORTNESS OF BREATH COPD EXACERBATION      HPI:   From admission H&P:   ''[name] is a 88 y.o. female w/ h/o myasthenia gravis, IDC in bilateral breasts ER (+)/ HER 2 (-), HFrEF (EF 20%), COPD, CAD, CKD stage 4, cervical myelopathy presenting with acute on chronic hypoxemic resp failure     Admitted from clinic today. Patient states that she typically uses oxygen prn at home but over the last month she has been using it almost around the clock. Feels that she can't be away from the O2 very long before she feels very short of breath. Sometimes sleeps in a sitting position, difficult to sleep with many pillows because of a crick in her neck. For the last 4 days, has been taking lasix  60mg  daily. Weighs ~129lbs normally. Weighed 138 today at her PCP's office, but pt states she was wearing a lot of clothes (on review of weights at PCP officse she has been 138 140lbs over last 6 months)     Pt also endorses chills x 2  weeks and productive cough of yellow-green sputum x 2 days. Using her PRN inhaler more often, taking her other inhaler as prescribed. Unclear if taking montelukast  or not. No known sick contacts but lives at an ALF named [address].      14-point ROS negative except as outlined above.[facility] Problems      Hospital Course:   [name] is a 88 y.o. female admitted for SHORTNESS OF BREATH 2/2 COPD exacerbation. She was started on treatment with duonebs and steroids with rapid improvement of symptoms. Antibiotics were discontinued due to low concern for bacterial infection or PNA. On day of discharge, she ambulated around the unit with her walker satting >=92 on room air, was back at her functional baseline, no further shortness of breath symptoms.        Acute Problems:  #acute on chronic hypoxemia, on 2L home O2 at baseline  #Severe obstructive airway disease (COPD/asthma/mixed?)  #productive cough, acute  On chart review, ''PFTs 03/16/2016 showed  severe obstructive airways disease FVC 1.59/59%, FEV1 0.98/49%, ratio 0.62, FEF 25-75% 0.42/28%; PFT 04/16/16-severe obstructive airways disease with slight response to bronchodilator, severe diffusion defect. FEV1/FVC 0.64, DLCO 45% '' NIFs normal throughout hospitlization   - cont home home spiriva  every day, home SABA prn   - Continue prednisone  40mg  daily x 5 days (to complete 3 additional days 11/28-11/30)  - repeat PFTs outpatient     Chronic Problems:  #Myesthenia Gravis  Well controlled per outpatient notes. On ultomiris q4 weeks  - cont home pyridostigmine  60mg  TID  - on azathioprine  (unclear if dose 75mg  daily or 150mg  daily) > pharmacy med rec  -  NIF were monitored while inpatient and without concern     #HFrEF--EF 20%  Last TTE 06/2023, EF 22% with severely reduced systolic function. Biventricular pacemaker placement scheduled for 03/03/2024  - no evidence of heart failure/volume overload   - TTE repeated 11/26,   CONCLUSIONS   1. Normal left ventricular size.   2. Mild concentric left ventricular hypertrophy.   3. There are LV regional wall motion abnormalities.   4. There is hypokinesis to akinesis of the entire length of the anteroseptal and septal walls.   5. LV systolic function is severely reduced. LV ejection fraction is approximately 25 to 30%.   6. Abnormal LV diastolic function (Grade I).   7. Normal right ventricle in size. Normal RV systolic function.   8. Mild mitral valve regurgitation.   9. Mild tricuspid regurgitation.  10. There are no prior studies on this patient for comparison purposes.  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 Diagnosed on screening mammo in 06/2017. At the time, elected to not have surgery. Was on tamoxifen > anastrozole  for a period of time but discontinued endocrine therapy due to side effects. 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 Opted against surgery d/t anesthesia risk from MG. Discussed case with rad/onc for radiation therapy, but pt not currently a candidate for RT. Previously on anastrazole in 2023 but stopped d/t diarrhea  - Continue exemestane  25mg  daily, started 06/2023     #HTN  Stable  - cont losartan       #major depression  #chronic benzo use   - continue clonazepam  1.5mg  qhs, duloxetine  60mg  daily, and bupropion  150mg  BID     #CAD  H/o declining stent  -f/u outpatient     #Cervical myelopathy  Monitored outpatient by neuro  - CTM     #GERD  s/p Nisan fundoplication  - cont home pantoprazole  40mg  daily        DISCHARGE PLAN:  Discharge home with home health.      Procedures Performed:      na    Operations Performed:   na    Relevant Imaging Studies:     Echo adult transthoracic complete   Final Result by Interface, Rad Results In (11/26 1857)    1. Normal left ventricular size.    2. Mild concentric left ventricular hypertrophy.    3. There are LV regional wall motion abnormalities.    4. There is hypokinesis to akinesis of the entire length of the anteroseptal and septal walls.    5. LV systolic function is severely reduced. LV ejection fraction is approximately 25 to 30%.    6. Abnormal LV diastolic function (Grade I).    7. Normal right ventricle in size. Normal RV systolic function.    8. Mild mitral valve regurgitation.    9. Mild tricuspid regurgitation.   10. There are no prior studies on this patient for comparison purposes.   [id number] Fausto Alpha MD   Electronically signed by [id number] Fausto Alpha MD on 01/22/2024 at 6:57:43 PM       Sonographer: [facility]            Final  XR chest ap portable (1 view)   Final Result by Delores Sauer, MD (11/25 1741)   IMPRESSION:   Stable cardiac silhouette. Calcification of the thoracic aorta.   Bands of atelectasis or scar within the lung bases. Mild bronchial wall thickening. No focal consolidation.   No gross effusions.   Degenerative changes of the spine.            Signed by: Sauer Delores   01/21/2024 5:41 PM          Relevant labs:   Recent Labs     01/23/24  0634 01/22/24  0604 01/21/24  1350   WBC 7.44 9.67 8.21   HGB 10.9* 12.9 12.5   HCT 34.7* 41.0 38.4   PLT 273 351 365     Recent Labs     01/23/24  0634 01/22/24  0604 01/21/24  1445   NA 137  --  139 ~ 138   K 3.7  --  5.1 ~ 5.2   CL 99  --  99 ~ 101   CO2 29  --  23 ~ 28   ANIONGAP 9  --  17 ~ 9   BUN 37*  --  23* ~ 24*   CREAT 1.49* ~ 1.49*  --  1.48* ~ 1.54*   GLUCOSE 99  --  81 ~ 90   CALCIUM  8.9  --  9.2 ~ 9.4   MG 1.7 1.7  --    PHOS 3.9 3.8  --       No results for input(s): ''INR'', ''PT'', ''APTT'' in the last 72 hours.  Recent Labs     01/21/24  1445   TOTPRO 7.0   ALBUMIN 4.1   BILITOT 0.2   ALT 9   AST 18   ALKPHOS 76     Recent Labs     01/22/24  0604 01/21/24  2305 01/21/24  2007 01/21/24  1445   TROPONIN 25* 23* 21*  --    BNP  --   --   --  525*     Recent Labs     01/21/24  1445   PROCAL <0.10     No results for input(s): ''BLDUR'', ''KETONESUR'', ''PROTUR'', ''LEUKESTUR'', ''NITRITEUR'', ''RBCSUR'', ''WBCSUR'' in the last 72 hours.        Physical Exam: BP 96/57  ~ Pulse 85  ~ Temp 36.2 ?C (97.2 ?F) (Temporal)  ~ Resp 17  ~ Ht 1.626 m (5' 4'')  ~ Wt 61.1 kg (134 lb 12.8 oz)  ~ SpO2 (!) 90%  ~ BMI 23.14 kg/m?                 System Check if within normal limits Positive or additional negative findings   Constit  [x]  General appearance  NAD. Euvolemic on examination   Eyes  []  Conj/Lids  []  Pupils   []  Fundi     HENMT  []  External ears/nose    [x]  Gross Hearing   []  Lips/teeth/gums    []  Mucus membranes  []  Otoscopy     []  Nasal mucosa   []  Oropharynx    []  Head        Neck  []  Inspection/palpation   []  Thyroid     Resp  [x]  Effort    [x]  Auscultation    []  Wheezing   []  Crackles        CV  [x]  Rhythm/rate   []  Murmurs   []  LE edema   []  JVP  non-elevated No LE edema noted    Normal pulses:   []  Radial             []  Femoral        []  Pedal    Breast  []  Inspection []  Palpation     GI  [x]  Bowel sounds    [x]  Soft   []  Nontender   [x]  Nondistended   [x]  Rebound/Guarding   []  Liver/spleen   []  Rectal    GU  M:    []  Scrotum     []  Penis    []  Prostate  []  Foley catheter in place   []  Suprapubic tube in place     F:     []  External   []  Cervix     []  Uterus        []  Vaginal wall  []  Mucus  []  Adnexa    Lymph  []  Neck  []  Axillae []  Groin     MSK Specify site examined:         [x]  Inspect/palp   []  Stability   []  Gait        []  ROM   []  Strength/tone   []  Back    Skin  []  Inspection   []  Palpation     Neuro  []  CN2-12 intact grossly   [x]  Alert and oriented   []  DTR      []  Muscle strength      []  Sensation   []  Balance     Psych  [x]  Insight/judgement     [x]  Mood/affect    []  Gross cognition            CAM DELIRIUM TEST:     Feature One:   A. Acute Change in mental status from baseline? []  yes [x]  no            B. Is there fluctuation in the mental status? []  yes [x]  no     Feature Two:  A. Does that patient have difficulty focusing attention?  []  yes [x]  no     Feature Three:  A.Is there evidence of disorganized thinking?  []  yes [x]  no     Feature Four:  A. Does the patient have an altered level of consciousness (ie: vigilant, lethargic, stuporous, comatous?  []  yes [x]  no     The diagnosis of delirium by CAM requires the presence of features 1 and 2 and either 3 or 4.        Outpatient Provider To-Do List   PCP:  --follow up asthma/COPD sx and management   --refill inhalers if appropriate   --repeat PFTs outpatient  --assess home O2 requirement needs at resolution of symptoms  --cardiology follow up as scheduled       Pending labs   Unresulted Labs (From admission, onward)      None             Discharge medications High-risk medications      Medication List        PAUSE taking these medications      tiotropium 18 mcg/act inhalation capsule  Wait to take this until your doctor or other care provider tells you to start again.  Commonly known as: Spiriva   Place 1 capsule (18 mcg total) into inhaler and inhale daily.            START taking these medications      predniSONE  10 mg tablet  Take 4 tablets (40 mg total)  by mouth daily for 3 days.  Start taking on: January 24, 2024     * SPIRIVA  RESPIMAT 1.25 MCG/ACT inhaler  Generic drug: tiotropium bromide   Inhale 2 puffs daily.     * tiotropium bromide  1.25 mcg/act inhaler  Commonly known as: Spiriva  Respimat  Inhale 2 puffs daily.           * This list has 2 medication(s) that are the same as other medications prescribed for you. Read the directions carefully, and ask your doctor or other care provider to review them with you.                CHANGE how you take these medications      metoprolol  succinate 25 mg 24 hr tablet  Commonly known as: Toprol -XL  Take 1 tablet (25 mg total) by mouth daily.  What changed: Another medication with the same name was removed. Continue taking this medication, and follow the directions you see here.     ULTOMIRIS 1100 MG/11ML Soln  Generic drug: Ravulizumab-cwvz  What changed: Another medication with the same name was removed. Continue taking this medication, and follow the directions you see here.            CONTINUE taking these medications      albuterol  90 mcg/act inhaler  Commonly known as: Proventil  HFA, Ventolin  HFA  INHALE 1-2 PUFF BY MOUTH EVERY 4 HOURS AS NEEDED FOR WHEEZING OR SHORTNESS OF BREATH     aspirin  81 mg EC tablet  Take 1 tablet (81 mg total) by mouth daily.     azaTHIOprine  50 mg tablet  Commonly known as: Imuran   Take 1.5 tablets (75 mg total) by mouth daily.     buPROPion  (SR) 200 mg 12 hr tablet  Commonly known as: Wellbutrin  SR  TAKE 1 TAB BY MOUTH TWICE DAILY (ANTIDEPRESSANT)     clonazePAM  1 mg tablet  Commonly known as: KlonoPIN   TAKE 1 AND HALF TABLET BY MOUTH AT NIGHT     dapagliflozin  10 mg tablet  Commonly known as: FARXIGA   Take 1 tablet (10 mg total) by mouth daily.     DEXILANT  60 MG DR capsule  Generic drug: dexlansoprazole   Take 1 capsule (60 mg total) by mouth daily.     DULoxetine  20 mg DR capsule  Commonly known as: Cymbalta   TAKE 3 CAPSULES (60 MG TOTAL) BY MOUTH DAILY     EPINEPHrine  0.3 mg/0.3 mL auto-injector  Commonly known as: EpiPen   INJECT 0.3ML (0.3MG ) SUBCUTANEOUSLY AS NEEDED FOR ANAPHYLAXIS (ACUTE AND CHRONIC RESPIRATORY FAILURE WITH HYPOXIA)     exemestane  25 mg tablet  Commonly known as: Aromasin   Take 1 tablet (25 mg total) by mouth daily.     fluticasone -vilanterol 100-25 mcg/act inhaler  Commonly known as: Breo Ellipta   INHALE 1 PUFF BY MOUTH EVERY DAY (ACUTE AND CHRONIC RESPIRATORY FAILURE WITH HYPOXIA)     furosemide  20 mg tablet  Commonly known as: Lasix   Take 1 tablet (20 mg total) by mouth daily.     losartan  100 mg tablet  Commonly known as: Cozaar   TAKE 1 TAB BY MOUTH EVERY DAY (HEART FAILURE, UNSPECIFIED)     montelukast  10 mg tablet  Commonly known as: Singulair   TAKE 1 TAB BY MOUTH EVERY EVENING (ACUTE AND CHRONIC RESPIRATORY FAYLURE WITH HYPOXIA)     penicillin  V potassium 500 mg tablet  TAKE 1 TAB BY MOUTH TWICE DAILY (BACTERIAL INFECTION)     pyRIDostigmine  60 mg tablet  Commonly known as:  Mestinon   TAKE 1 TAB BY MOUTH THREE TIMES DAILY (MYASTHENIA GRAVIS)     REPATHA  SURECLICK 140 MG/ML Soaj  Generic drug: Evolocumab   INJECT 1 PEN UNDER THE SKIN EVERY 14 DAYS (HEART FAILURE, UNSPECIFIED)     sodium chloride  0.9% IV soln               Where to Get Your Medications        These medications were sent to [facility]  - [place] - [address]  [address] [place] [address]      Phone: [phone]   predniSONE  10 mg tablet  tiotropium bromide  1.25 mcg/act inhaler       You can get these medications from any pharmacy    Bring a paper prescription for each of these medications  SPIRIVA  RESPIMAT 1.25 MCG/ACT inhaler  tiotropium 18 mcg/act inhalation capsule      This patient does not have coumadin on their discharge med list    Controlled meds:   Controlled Medications       Anticonvulsants - Benzodiazepines Disp Start End     clonazePAM  1 mg tablet 135 tablet 09/04/2023 --    Sig: TAKE 1 AND HALF TABLET BY MOUTH AT NIGHT             Nutrition recommendations & malnutrition assessment         The patient was not consulted or assessed by a Registered Dietitian (RD) during this admission.         Discharge diet orders    2gm Sodium or low salt   As directed      2000mg  or less of sodium per day. Do not use salt. Avoid foods with added   salt such as highly processed foods, packaged foods, salty foods, and   foods from restaurants. To season your food, use herbs, spices, vinegar,   lemon, lime, or other salt-free herb/spice blends.         Rehab assessment   No PT evaluation this encounter  I have seen and examined the patient and agree with the PT assessment detailed below:                  Discharge activity orders        Disposition Discharge condition   Residential care facility stable     Requested appointments Scheduled appointments    We will schedule follow up appointment(s) and call you with appointment   time(s)   As directed      Is the patient being discharged to SNF? No  Clinic/Test/Procedure to be scheduled:PCP  Doctor Preferences (If applicable): [name], MD  What studies should be completed prior to follow up (if applicable): na  Time Frame: within 1 week   Reason for Appointment/Procedure: Post hospital follow up for COPD   exacerbation     Future Appointments   Date Time Provider Department Center   02/03/2024 11:00 AM Leavy Gruber, MD CRD DIS 2020 [facility]   02/06/2024  9:20 AM [name], MD GERI IMS 420 [place]   02/06/2024  2:00 PM [name], MD GERI IMS [facility] [place]   03/31/2024 11:00 AM Ely Carte, MD HEM/ONC [address] [facility]   04/07/2024 11:00 AM SM 2020 ECHO 03 CARDIOLOGY CARDIAC IMAGING CI [id number] [facility]        Case Manager/Home Health assessment   Discharge Information  Discharge Address: [facility]: [address], [place]  [place], [place] [address] (01/22/24 1647)                         Home Health orders    Home Health Aide   As directed      Home Health Occupational Therapy   As directed      Occupational therapy works with the patient/family/caregiver in regards   to how to perform activities of daily living (ADLs) as independently as   possible, in addition to providing recommendations for adaptive equipment,   exercises to improve/maintain upper extremity ROM/strength, etc.    Home Health Physical Therapy   As directed      Home Health Physician Attestation for Home Health   As directed      Patient Name: [name]    Patient MRN: [id number]      Attending Provider: Bluford Rocky LABOR., MD        ATTESTATION OF FACE-TO-FACE PHYSICIAN-PATIENT ENCOUNTER    (Face to Face encounter must occur 90 days prior to, or 30 days after the   Start of Care date)    Physician ordering/certifying Home Health services: Vernell HERO. Swier, MD    Date of Face-to-Face Encounter: 01/23/2024      I certify that this patient is under my care and that a face-to-face   encounter visit was conducted with the patient as noted above.      Attending Physician [NPI]: Bluford Rocky LABOR., MD [[id number]]          Ordering Provider:Rachel M. Swier, MD         Date: 01/23/2024        Time: 2:01 PM          (For non-physician providers, please send document for co-signature by the   attending provider)       LACE+ Risk Stratification    Total Score Risk Stratification   0-28 Minimal Risk   29-58 Moderate Risk   59-78 High Risk   79-90 Highest Risk     Readmission Score        Score Rules    69 Total Score    15 Urgent Admission    3 Length of Stay    51 Charlson Score (by age & number of urgent admissions)    Criteria that do not apply:    Female Patient    Discharge Institution    Alternative Level of Care Status    ED Visits in Previous 6 Months    Elective Admission in Previous Year             I have seen and examined the patient on their discharge day. I have reviewed, edited, and agree with the above discharge summary. More than 30 minutes was personally spent on discharge planning on the day of discharge in coordination of care, counseling the patient and preparation of discharge.      DWA Dr. Rocky Bluford     Vernell Bijou MD PGY 3    I have interviewed and examined the patient with Dr. Bijou on the date of discharge. All labs, studies, and discharge medications were independently reviewed by me. I have reviewed the discharge summary above and agree with the history, physical, assessment and plan which we formulated together. The patient and/or caregiver was informed of the above plan, demonstrated understanding and was agreeable to the plan as written above. I have the following additions to the note:    Total Time Spent  on Discharge Planning: > 30 minutes coordianting with family, case management      Trevonn Hallum A. Bluford, MD

## 2024-01-23 NOTE — Progress Notes
 CLINICAL SOCIAL WORKER PROGRESS NOTE    Admit Date:01/21/2024    Date of CSW Assessment: 01/23/2024    REFERRAL INFORMATION   Date Seen: 01/23/24  Date Referred: 01/23/24  Time In: 1107  Reason for Referral: Discharge Planning      CLINICAL ASSESSMENT     Per rounds, pt likely medically stable for discharge today. Pt resides at [facility]: [address], [place] [address]#[phone].    SW contacted ALF, SW spoke Sierra to inquire if pt is able to return. Per Sierra, pt is able to return and no paper work was requested. SW updated medical team.    Plan/Recommendations: Discharge to Home    Interdisciplinary rounds were conducted with the multidisciplinary team including the clinical social worker and nurse case production designer, theatre/television/film. The patient's plan of care and discharge planning were discussed and formulated based on the patient's specific needs.    Suan Jubilee, LCSW  Clinical Social Worker III ~Geriatric Medicine  [phone]

## 2024-01-23 NOTE — End of Shift Summary
 Patient discharged on 01/23/2024 at time of 1800  Patient discharged to home and transported by family  Vitals signs upon discharge were Blood pressure 96/57, pulse 85, temperature 36.2 ?C (97.2 ?F), temperature source Temporal, resp. rate 17, height 1.626 m (5' 4''), weight 61.1 kg (134 lb 12.8 oz), SpO2 (!) 90%.  Skin condition intact  Patient oriented x4    Discharge instructions/AVS and medication information given to patient and family member(s)  Patient education and medication education completed with patient and family member(s)  patient and family member(s) aware that the patient does not have new medications to be picked up from outside pharmacy  Patient had no home medications that needed to be returned to the patient from the hospital pharmacy.     Discharge orders include do not include home health care.  Patient has no DME needs.    Belongings list signed and sent home with patient.

## 2024-01-24 MED ORDER — TIOTROPIUM BROMIDE 1.25 MCG/ACT IN AERS
2 | Freq: Every day | RESPIRATORY_TRACT | 0 refills | Status: SS
Start: 2024-01-24 — End: 2024-02-01

## 2024-01-24 MED ORDER — SPIRIVA RESPIMAT 1.25 MCG/ACT IN AERS
2 | Freq: Every day | RESPIRATORY_TRACT | 0 refills | 30.00000 days | Status: SS
Start: 2024-01-24 — End: 2024-02-01

## 2024-01-24 MED ORDER — PREDNISONE 10 MG PO TABS
40 mg | ORAL_TABLET | Freq: Every day | ORAL | 0 refills | 6.00000 days | Status: AC
Start: 2024-01-24 — End: 2024-01-24

## 2024-01-24 MED ORDER — PREDNISONE 10 MG PO TABS
40 mg | ORAL_TABLET | Freq: Every day | ORAL | 0 refills | 6.00000 days | Status: AC
Start: 2024-01-24 — End: ?

## 2024-01-24 MED ORDER — TIOTROPIUM BROMIDE MONOHYDRATE 18 MCG IN CAPS
18 ug | ORAL_CAPSULE | Freq: Every day | RESPIRATORY_TRACT | 0 refills | Status: SS
Start: 2024-01-24 — End: 2024-02-03

## 2024-01-24 MED ADMIN — IPRATROPIUM-ALBUTEROL 0.5-2.5 (3) MG/3ML IN SOLN: 3 mL | RESPIRATORY_TRACT | @ 03:00:00 | Stop: 2024-01-24

## 2024-01-24 MED FILL — SPIRIVA HANDIHALER 18 MCG IN CAPS: 18 18 mcg/act | RESPIRATORY_TRACT | 30 days supply | Qty: 30 | Fill #0

## 2024-01-24 MED FILL — COMBIVENT RESPIMAT 20-100 MCG/ACT IN AERS: 20-100 20-100 MCG/ACT | RESPIRATORY_TRACT | 30 days supply | Qty: 4 | Fill #0

## 2024-01-24 MED FILL — COMBIVENT RESPIMAT 20-100 MCG/ACT IN AERS: 20-100 20-100 MCG/ACT | RESPIRATORY_TRACT | 60 days supply | Qty: 4 | Fill #0

## 2024-01-27 ENCOUNTER — Other Ambulatory Visit: Payer: MEDICARE

## 2024-01-28 ENCOUNTER — Telehealth: Payer: PRIVATE HEALTH INSURANCE

## 2024-01-28 MED ORDER — FARXIGA 10 MG PO TABS
ORAL_TABLET | ORAL | 11 refills | Status: AC
Start: 2024-01-28 — End: ?

## 2024-01-28 NOTE — Telephone Encounter
 Discharge Followup Appointment Info      Date of Discharge: 01/23/2024    Discharge Disposition: Home or Self Care   TIme Frame: Within 14 Days    Sched with PCP?: Patient's [place] PCP PCP Name: [name], MD   Date and Time of appt: 02/06/24  9:20 AM PST    Was Appt Confirmed?: Pos    Method of Appt Confirmation: Confirmed via phone directly with patient/family    Barriers to Scheduling: None    Did you schedule any Specialty visits?: No    Additional Appts/Tests/Info Future Appointments  02/06/2024 9:20 AM    [name], MD     GERI IMS 420        [place]    Appt has been confirmed [phone].

## 2024-01-29 ENCOUNTER — Ambulatory Visit: Payer: MEDICARE | Attending: Geriatric Medicine

## 2024-01-29 MED ORDER — DEXILANT 60 MG PO CPDR
60 mg | ORAL_CAPSULE | Freq: Every day | ORAL | 3 refills | 60.00000 days | Status: AC
Start: 2024-01-29 — End: ?

## 2024-01-29 NOTE — Progress Notes
 Telemedicine GERIATRICS CLINIC NOTE    PATIENT:  [name]   MRN:  [id number]  DOB:  02/15/1936  DATE OF SERVICE:  01/29/2024  PRIMARY CARE PHYSICIAN: [name], MD    CHIEF COMPLAINT:   No chief complaint on file.      [facility]:  [facility]--Neurology      [facility]:    Chaperone status:  No data recorded    HISTORY OF PRESENT ILLNESS     [name] is a 88 y.o. female who presents today for *follow up**. Patient is accompanied by *no one**. History today is per the patient,and review of available recent records in [facility] and Care Everywhere.     Patient is a(n) [x]  reliable []  unreliable historian and additional collateral information was obtained during the visit today from:       Here with her daughter.  Feeling better initially.    Diarrhea and constipation intermittently.            Per chart review, pertinent medical history:  Past Medical History:   Diagnosis Date    Asthma     Breast cancer (HCC/RAF) 12/20/2018    CHF (congestive heart failure) (HCC/RAF)     COPD (chronic obstructive pulmonary disease) (HCC/RAF)     Diabetes mellitus (HCC/RAF)     Renal disorder      No past surgical history on file.    ALLERGIES     Allergies   Allergen Reactions    Beta Adrenergic Blockers Other (See Comments)     Avoid d/t Myasthenia Gravis  Avoid d/t Myasthenia Gravis      Levofloxacin Other (See Comments)     Flare of Myasthenia Gravis    Macrolides And Ketolides Other (See Comments)     Avoid d/t Myasthenia Gravis  Use with caution d/t Myasthenia Gravis  Use with caution d/t Myasthenia Gravis      Sulfa Antibiotics Other (See Comments)     May possibly have caused deafness in one ear  May possibly have caused deafness in one ear  other      Botulinum Toxins Other (See Comments)     Muscle weakness r/t Myasthenia Gravis  Muscle weakness r/t Myasthenia Gravis      Pollen Extract Wheezing    Statins Other (See Comments)     Muscle aches  Muscle aches  Muscle aches      Plastic Tape Itching and Other (See Comments)     Turns the skin red where applied        MEDICATIONS     Personally reviewed.    No outpatient medications have been marked as taking for the 01/29/24 encounter (Appointment) with [name], MD.       SOCIAL HISTORY     Social History     Social History Narrative    Not on file        FUNCTIONAL STATUS     BADLs:  IADLs:    Ambulates with   Falls in past year:  Afraid of falling:    GERIATRIC REVIEW OF SYSTEMS     Vision:    []   glasses     []  legally blind  Hearing: []  hearing aids    Nutrition: []  normal   []  impaired  []  vegan  []  vegetarian  []  low salt  []  low-carb   Swallowing: []  impaired   Dentures:   []  yes    Depression:  []   yes   Cognition:     Incontinence: [] urine  []  fecal  []  urine and fecal      ADVANCED CARE PLANNING     Advanced directives on file: []  No  []   Yes ? Completed:     Medical DPOA on file:   []   Yes ?   []   No ? Patient designates ** * to be their surrogate medical decision-maker.         HEALTH CARE MAINTENANCE     IMMUNIZATIONS:   Immunization History   Administered Date(s) Administered    COVID-19 [employer] and up) PF, 30 mcg/0.3 mL 09/24/2023    COVID-19 [employer] Fall 2023 12y and up) PF, 30 mcg/0.3 mL 03/27/2022    COVID-19, mRNA, (Pfizer - Purple Cap) 30 mcg/0.3 mL 03/11/2019, 04/01/2019, 10/29/2019    COVID-19, mRNA, bivalent (Pfizer) 30 mcg/0.3 mL (12y and up) 11/25/2020, 07/05/2021    COVID-19, mRNA, tris-sucrose (Pfizer - [employer]) 30 mcg/0.3 mL 06/20/2020    DTaP 12/31/2015    influenza vaccine IM quadrivalent (Fluzone Quad) MDV (88 months of age and older) 11/26/2016    influenza vaccine IM quadrivalent adjuvanted (FluAD Quad) (PF) SYR (61 years of age and older) 11/05/2018, 12/01/2019, 11/25/2020    influenza vaccine IM quadrivalent high dose (Fluzone High Dose Quad) (PF) SYR (104 years of age and older) 11/22/2021    influenza vaccine IM trivalent high dose (Fluzone High Dose) (PF) SYR (39 years of age and older) 12/11/2022    influenza, unspecified formulation 05/10/2014, 12/29/2015, 11/26/2016, 04/01/2019, 04/01/2019, 12/01/2019, 12/01/2019, 12/01/2019, 12/01/2019, 11/25/2020, 11/25/2020, 01/03/2022, 12/11/2022    meningococcal conjugate 4-valent (Menveo 2-vial) IM MCV4O 01/23/2023    pneumococcal conjugate vaccine 20-valent (Prevnar 20) 07/05/2021    pneumococcal polysaccharide vaccine 23-valent (Pneumovax) 01/10/2019       Mammogram:  DXA:  PAP:  Colonoscopy:      PHYSICAL EXAM     There were no vitals taken for this visit.  Wt Readings from Last 3 Encounters:   01/23/24 134 lb 12.8 oz (61.1 kg)   01/21/24 136 lb (61.7 kg)   12/03/23 137 lb 3.2 oz (62.2 kg)       System Check if normal Positive or additional negative findings   GEN  [x]  NAD     Eyes  []  Conj/Lids []  Pupils  []  Fundi   []  Sclerae []  EOM     ENT  []  External ears   []  Otoscopy   []  Gross Hearing    []   External nose   []  Nasal mucosa   []  Lips/teeth/gums    []  Oropharynx    []  Mucus membranes      Neck  [x]  Inspection/palpation    [x]  Thyroid     Resp  [x]  Effort    []  Auscultation    Distant Breath sounds   CV  [x]  Rhythm/rate   [x]  Murmurs   [x]  Edema   []  JVP non-elevated    Normal pulses:   []  Radial []  Femoral  []  Pedal  +HJR   Breast  []  Inspection []  Palpation     GI  []  Bowel sounds    [x]  Nontender   [x]  No distension    []  No rebound or guarding   []  No masses   []  Liver/spleen    []  Rectal     GU  F:  []  External []  vaginal wall         []  Cervix     []  mucus        []   Uterus    []  Adnexa   M:  []  Scrotum []  Penis         []  Prostate     Lymph  []  Cervical []  supraclavicular     []  Axillae   []  Groin/inguinal     MSK []  Gait  []  Back     Specify site examined:    []  Inspect/palp []  ROM   []  Stability []  Strength/tone []  Used arms to push up from seated to standing position   Assistive device:  []  single point cane []  quad cane  []  FWW []  rollator walker      Skin  []  Inspection []  Palpation     Neuro  []  Alert and oriented     []  CN2-12 intact grossly   []  DTR      []  Muscle strength      []  Sensation   []  Pronator drift   []  Finger to Nose/Heel to Shin   []  Romberg     Psych  []  Insight/judgement     []  Mood/affect    []  Gross cognition            LABS/STUDIES     LABS:  Lab Results   Component Value Date    WBC 7.44 01/23/2024    WBC 9.67 01/22/2024    WBC 8.21 01/21/2024    HGB 10.9 (L) 01/23/2024    HGB 12.9 01/22/2024    HGB 12.5 01/21/2024    MCV 98.0 01/23/2024    PLT 273 01/23/2024    PLT 351 01/22/2024    PLT 365 01/21/2024     Lab Results   Component Value Date    NA 137 01/23/2024    NA 138 01/21/2024    NA 139 01/21/2024    K 3.7 01/23/2024    K 5.2 01/21/2024    K 5.1 01/21/2024    CREAT 1.49 (H) 01/23/2024    CREAT 1.49 (H) 01/23/2024    CREAT 1.54 (H) 01/21/2024    CREAT 1.48 (H) 01/21/2024    GFRESTNOAA 29 06/09/2020    GFRESTNOAA 28 05/18/2020    GFRESTNOAA 30 03/23/2020    GFRESTAA 34 06/09/2020    GFRESTAA 33 05/18/2020    GFRESTAA 35 03/23/2020    CALCIUM  8.9 01/23/2024    CALCIUM  9.4 01/21/2024    CALCIUM  9.2 01/21/2024     Lab Results   Component Value Date    ALT 9 01/21/2024    ALT 7 (L) 10/31/2023    AST 18 01/21/2024    AST 20 10/31/2023    ALKPHOS 76 01/21/2024    BILITOT 0.2 01/21/2024    ALBUMIN 4.1 01/21/2024    ALBUMIN 4.1 10/31/2023     Lab Results   Component Value Date    TSH 1.6 09/24/2023    TSH 2.5 04/17/2023    TSH 1.5 12/11/2022     Lab Results   Component Value Date    HGBA1C 6.0 (H) 04/17/2023    HGBA1C 5.9 (H) 07/05/2022    HGBA1C 5.3 05/03/2022     Lab Results   Component Value Date    CHOL 160 12/11/2022    CHOL 205 07/05/2022    CHOL 226 01/07/2022     Lab Results   Component Value Date    CHOLDLQ 76 12/11/2022    CHOLDLQ 135 (H) 07/05/2022    CHOLDLQ 144 (H) 01/07/2022     Lab Results   Component Value Date    VITD25OH 76 10/18/2021  Lab Results   Component Value Date    FE 87 07/14/2019    FERRITIN 57 07/14/2019    FOLATE 9.6 11/25/2019    TIBC 356 07/14/2019     Lab Results   Component Value Date VITAMINB12 3,994 (H) 11/25/2019    VITAMINB12 217 (L) 09/22/2019     Lab Results   Component Value Date    BNP 525 (H) 01/21/2024    BNP 498 (H) 10/31/2023    BNP 355 (H) 08/19/2023     No results found for: ''PSATOTAL''      STUDIES:      EKG  Jan 21, 2024  NSR, LBBB        XR CHEST PA LAT 2V  September 22, 2019   COMPARISON: [facility]UB December 17, 2018     History: sob     FINDINGS:     Lungs: Clear  Heart/aorta: Normal heart size. The thoracic aorta is mildly calcified, tortuous and ectatic.  Adenopathy: None  Pleura: No effusion  Bones and Chest wall: No acute bony or body wall  findings. Osteopenia and bony maturational changes. Right upper quadrant cholecystectomy clips stable from October 2020        IMPRESSION:     No acute findings or abnormalities related to provided history.      MRI C spine  May 03, 2021  IMPRESSION:  Redemonstrated congenital cervical spinal canal stenosis with superimposed multilevel degenerative changes, which are mildly progressive compared to prior as above. No evidence of cord compression or cord signal abnormality.      Echo  Jan 2023  1. Normal left ventricular size.  2. There are LV regional wall motion abnormalities.  3. Left ventricular ejection fraction is approximately 35 to 40%.  4. Abnormal LV diastolic function.  5. There are no prior studies on this patient for comparison purposes.  ASSESSMENT and PLAN     LANAIYA LANTRY is a 88 y.o. female who presents today for *follow up**.    #    #Acute on Chronic hypoxemic respiratory failure--oxygen dependent but markedly worse!  Suspect component of fluid overload as sx improved last week after three days of diuresis.  SX worsened more recently with superimposed productive cough with yellow sputum.  Check labs.  Check Cx.  Admit for diagnostic work up and diuresis as well as antibiotics.  Have low threshold to obtain CT Chest to better evaluate both airspace as well as pulmonary arteries--at high risk for DVT/PE given active breast cancer and use of aromatase inhibitor.  Will request EP discussion for consideration of biventricular pacer if this is felt to benefit SX in short run.  Admission discussed with admitting resident at [phone].  #Major Depression--active--sTolerating Duloxetine . Maintaining clonazepam  given severe anxiety now.  #Myesthenia Gravis--not worse. On  ULTOMIRIS . Need to clarify timing of most recent infusion.  #HFrEF--EF 20%noted on echo-- 2023--Worse.  Chronic.  Will monitor and treat underlying risk factors with medical and lifestyle interventions as warranted by balance between benefits and burdens.  Had planned Biventricular syncronized pacer 100%  and will request evaluation for this procedure while in house.  #CAD--discussed medical management alone with shared decision making model May 11, 2021.  Declines stent.  #Cervical myelopathy--monitored by neuro--chronic--re-imaged on MRI as above.  CTM.  #HTN--Increased nifedipine  CR 60 mg daily. Continue losartan  50 mg.  BP Better.    #CKD--stage 4--chronic--CTM--control BP as tolerated by symptoms of orthostasis and fatigue.  Trial off trimethoprim .  REcheck Cr now  and in two weeks.  #GERD--s/p Nisan fundoplication  #COPD--mixed--per PFT's from [place] .--albuterol  prn.  May resume other inhalders.  #s/p rectocele repair  #s/p cholecystectomy  #Immunosupressed status--medication related.  Chronic. CTM  #Nocturnal hypoxemia--presumably related to MG crisis but has history of COPD.  Will order nocturnal home O2 study and consider PFT's/Pulmonary evaluation once settled down.  May need to restart BREO inhaler.  #Myasthenia Gravis--s/p plasmapheresis x5 October 2020, no Thymoma, on Azathioprine  150 daily and mestinon  60 tid.  Saw [facility]. Montell at [facility].  Was considering Soliris.  Ordering IVIG and MRI.  Encourage ongoing neurology follow up.   #IBS--titrate miralax  to comfort.    #Interstitial cystitis--stopping daily Trimethoprim  100 for UTI prophylaxis.  Using bladder instillations prn. OFF topical estrogen due to advice from neurologist re: MG.    #Breast CA--under care.  Patient continues to decline surgery.    #DM with CKD--stabe 3b--based on labs from [place] .  #OA knees--previously getting hyaluronic acid injections.  #Anxiety  #Benzodiazepine Dependence--uncomplicated--chronic.  Encourage patient to decrease intake--ideally to abstaining but will work towards this goal over time.  Not likely to achieve this given ongoing concerns re: breast ca.  #Fibromyalgia--duloxetine  as above.   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  #Statin intolerance    #Advanced directives--SNR/DNI/No feeding tubes.    FOLLOW-UP     RTC     Future Appointments   Date Time Provider Department Center   02/03/2024  8:00 AM [name], MD GERI IMS [address] [place]/Cen   02/03/2024 11:00 AM Leavy Gruber, MD CRD DIS 2020 [facility]   02/06/2024  9:20 AM [name], MD GERI IMS [address] [place]/Cen   02/06/2024  2:00 PM [name], MD GERI IMS [address] [place]/Cen   03/31/2024 11:00 AM Ely Carte, MD HEM/ONC [facility]   04/07/2024 11:00 AM SM 2020 ECHO 03 CARDIOLOGY CARDIAC IMAGING CI [id number] [facility]         The above plan of care, diagnosis, orders, and follow-up were discussed with the patient and/or surrogate. Questions related to this recommended plan of care were answered.    ANALYSIS OF DATA (Needs to meet 1 category for moderate and 2 categories for high LOS)     I have:     Category 1 (Needs 3 for moderate and high LOS)     []  Reviewed []  1 []  2 []  >= 3 unique laboratory, radiology, and/or diagnostic tests noted below    Test/Study:  on date .    []  Reviewed []  1 []  2 []  >= 3 prior external notes and incorporated into patient assessment    I reviewed Dr. 's note in specialty  from date .    []  Discussed management or test interpretation with external provider(s) as noted      []  Ordered []  1 []  2 []  >= 3 unique laboratory, radiology, and/or diagnostic tests noted in A&P    []  Obtained history from independent historian:       Category 2  []  Independently interpreted the test    Category 3  []  Discussed management or test interpretation with external provider(s) as noted      INTERACTION COMPLEXITY and SOCIAL DETERMINANTS of HEALTH       PROBLEM COMPLEXITY   []  New problem with uncertain diagnosis or prognosis (moderate)   []  Multiple stable chronic problems (moderate)   []  Chronic problem not stable - not controlled, symptomatic, or worsening (moderate)   []  Severe exacerbation of chronic  problem (high)   []  New or chronic problem that poses threat to life or bodily function (high)     MANAGEMENT COMPLEXITY   []  Old or external/outside records reviewed   []  Discussion with alternate (proxy) if patient with impaired communication / comprehension ability (e.g., dementia, aphasia, severe hearing loss).   []  Repeated questions (or disagreement) between patient and/among caregivers/family during the visit.  []  Caregiver/patient emotions/behavior/beliefs interfering with implementation of treatment plan.  []  Independent interpretation of test (EKG, Chest XRay)   []  Discussion of case with a consultant physician     RISK LEVEL   []  Prescription drug management (moderate)   []  Minor surgery with CV risk factors or elective major surgery (moderate)   []  Dx or Rx significantly limited by SDoH (inadequate housing, living alone, poor health care access, inappropriate diet; low literacy) (moderate)   []  Major surgery - elective with CV risk factors or emergent (high)   []  Need for hospitalization (high)   []  New DNR or de-escalation of care (high)      SDoH  The diagnosis or treatment of said conditions is significantly limited by the following social determinants of health:  []  Z59.0 Homelessness  []  Z59.1 Inadequate housing  []  Z59.2 Discord with neighbors, lodgers and landlord  []  Z60.2 Problems related to living alone  []  Z59.8 Other problems related to housing and economic circumstances  []  Z59.4 Lack of adequate food and safe drinking water  []  Z59.6 Low income  []  Z59.7 Insufficient social insurance and welfare support  []  Z59.9 Problems related to housing and economic circumstances, unspecified  []  Z75.3 Unavailability and inaccessibility of health care facilities  []  Z75.4 Unavailability and inaccessibility of other helping agencies  []  Z72.4 Inappropriate diet and eating habits  []  Z62.820 Parent-biological child conflict  []  Z63.8 Other specified problems related to primary support group  []  Z55.0 Illiteracy and low level literacy   []  Z56.9 Unspecified problems related to employment        If Billing Based on Time:     I performed the following items on the day of service:    [x]  Preparing to see the patient (e.g., review of tests)  [x]  Obtaining and/or reviewing separately obtained history   [x]  Performing a medically appropriate examination and/or evaluation   [x]  Counseling and educating the patient/family/caregiver   [x]  Ordering medications, tests, or procedures  [x]  Referring and communicating with other healthcare professionals (when not separately reported)  [x]  Documenting clinical information in the EHR  []  Independently interpreting results and communicating results to patient/family/caregiver    I spent the following total amount of time on these tasks on the day of service:  New Patient     Established Patient  []  15-29 minutes - 99202    []  up to 9 minutes - 99211  []  30-44 minutes - 99203     []  10-19 minutes - 99212   []  45-59 minutes - 99204      []  20-29 minutes - 99213   []  60-74 minutes - 99205   [x]  30- minutes - 99214         []  40-55 minutes - 99215    []  I spent an additional ** * 15-minute-increment(s) for a total of * ** minutes on these tasks on the day of service. [phone] for each additional 15 minutes.)    Author: Penne LOIS Quaker, MD 01/29/2024    [employer]ex[employer]  PA  STOP repatha 

## 2024-01-30 ENCOUNTER — Telehealth: Payer: PRIVATE HEALTH INSURANCE

## 2024-01-30 ENCOUNTER — Ambulatory Visit: Payer: BLUE CROSS/BLUE SHIELD

## 2024-01-30 ENCOUNTER — Telehealth: Payer: BLUE CROSS/BLUE SHIELD

## 2024-01-30 DIAGNOSIS — R112 Nausea with vomiting, unspecified: Secondary | ICD-10-CM

## 2024-01-30 DIAGNOSIS — R197 Diarrhea, unspecified: Principal | ICD-10-CM

## 2024-01-30 LAB — Glucose,POC: GLUCOSE,POC: 99 mg/dL (ref 65–99)

## 2024-01-30 NOTE — ED Provider Notes
 [name] [facility]  Emergency Department Service Report    Triage     [name], a 88 y.o. female, presents with Diarrhea (Non bloody with dizziness and hypotension x 1 month, recent admission dc  few days ago. Hx of DM, CHF, Myasthenia Gravis, IBS. VS stable in triage. O2 dependent at home. BG 99)    Arrived on 01/30/2024 at 2:50 PM   Arrived by Other [9] (RA [id number])    ED Triage Vitals   Temp Temp Source BP Heart Rate Resp SpO2 O2 Device Pain Score Weight   01/30/24 1455 01/30/24 1455 01/30/24 1455 01/30/24 1455 01/30/24 1455 01/30/24 1455 01/30/24 1455 01/30/24 1456 01/30/24 1456   36.7 ?C (98.1 ?F) Oral 133/60 90 18 99 % None (Room air) Zero 59 kg (130 lb)       Allergies   Allergen Reactions    Beta Adrenergic Blockers Other (See Comments)     Avoid d/t Myasthenia Gravis  Avoid d/t Myasthenia Gravis      Levofloxacin Other (See Comments)     Flare of Myasthenia Gravis    Macrolides And Ketolides Other (See Comments)     Avoid d/t Myasthenia Gravis  Use with caution d/t Myasthenia Gravis  Use with caution d/t Myasthenia Gravis      Sulfa Antibiotics Other (See Comments)     May possibly have caused deafness in one ear  May possibly have caused deafness in one ear  other      Botulinum Toxins Other (See Comments)     Muscle weakness r/t Myasthenia Gravis  Muscle weakness r/t Myasthenia Gravis      Pollen Extract Wheezing    Statins Other (See Comments)     Muscle aches  Muscle aches  Muscle aches      Plastic Tape Itching and Other (See Comments)     Turns the skin red where applied        Initial Physician Contact     Medical Screening Exam Initiated       Date/Time Event User Comments    01/30/24 1456 MSE Initiated [name], Kaitlyn V --          Medical Screening Exam - MD Comments      Date and Time MSE MD Comment User   01/30/24 1452 vomiting and diarr, recent dc from [facility], unable to get home meds ?( cdiff?) MVM            Initial Contact Completed?: Yes (01/30/24 1749)    History 88 year old female history of myasthenia gravis, IBS, diverticulitis, breast cancer, COPD, HFrEF, acute on chronic hypoxic respiratory failure, CKD stage IV, CAD, GERD presents to the ED with diarrhea.  Patient states she has had diarrhea with associated nausea and vomiting for many years.  In January, symptoms started to worsen.  She takes a medication for her IBS, however lately she has unable to obtain medication due to insurance.  Patient was also recently hospitalized for shortness of breath a few weeks ago for 2 days and discharged, now on 2 L nasal cannula at baseline at home.  Patient stated her diarrhea has worsened with a few days, loose, brown, and smelly.  Patient endorses emesis when trying to eat, has been unable to tolerate food or liquids for the last day.  She denies fever, chest pain, shortness of breath, bloody bowel movements, bloody emesis, and abdominal surgery.  Language Assistance                     Past Medical History:   Diagnosis Date    Asthma     Breast cancer (HCC/RAF) 12/20/2018    CHF (congestive heart failure) (HCC/RAF)     COPD (chronic obstructive pulmonary disease) (HCC/RAF)     Diabetes mellitus (HCC/RAF)     Renal disorder         History reviewed. No pertinent surgical history.     Past Family History   family history is not on file.     Past Social History   she reports that she quit smoking about 40 years ago. Her smoking use included cigarettes. She has quit using smokeless tobacco. She reports that she is not currently sexually active. She reports that she does not drink alcohol and does not use drugs.       Physical Exam   Physical Exam  Constitutional:       Appearance: Normal appearance.   HENT:      Head: Normocephalic and atraumatic.   Eyes:      Extraocular Movements: Extraocular movements intact.   Cardiovascular:      Rate and Rhythm: Normal rate and regular rhythm.      Pulses: Normal pulses.      Heart sounds: Normal heart sounds.   Pulmonary: Effort: Pulmonary effort is normal. No respiratory distress.      Breath sounds: Normal breath sounds. No wheezing or rhonchi.   Abdominal:      General: Abdomen is flat. There is no distension.      Palpations: Abdomen is soft.      Tenderness: There is abdominal tenderness (diffusely tender). There is guarding. There is no rebound.   Musculoskeletal:      Right lower leg: No edema.      Left lower leg: No edema.   Skin:     General: Skin is warm.      Capillary Refill: Capillary refill takes 2 to 3 seconds.   Neurological:      Mental Status: She is alert.                 Medical Decision Making   [name] is a 88 y.o. female history of myasthenia gravis, IBS, diverticulitis, breast cancer, COPD, HFrEF, acute on chronic hypoxic respiratory failure, CKD stage IV, CAD, GERD presents with acute on chronic diarrhea with associated nausea and vomiting, unable to tolerate PO.  Afebrile, vital signs stable.  Exam notable for diffusely tender abdomen without peritoneal signs.  Given patient with recent hospitalization, concern for C diff versus worsening IBS.  Patient's geriatric doctor [name] would like patient to be admitted to [facility] service in [facility].  Obtained CBC, BMP, lytes, LFTs, lipase, and c diff toxin. Pending CTAP.  Page triage hospitalist, okay for bed request.  Pending sign-out to medicine.            Medical Decision Making     Problems  Clinical Impressions Complexity of problems addressed         Diarrhea (Primary)  Nausea and vomiting, unspecified vomiting type  Immunosuppressed status  Heart failure with reduced ejection fraction LVEF <=40% (HCC/RAF)  Myasthenia gravis (HCC/RAF)  Irritable bowel syndrome with diarrhea  Chronic kidney disease, unspecified CKD stage High:  []  Acute/chronic illness/injury with threat to life to bodily function  [x]  Chronic illness with severe exacerbation, progression, or side effects of treatment  Moderate:  []  Undiagnosed new problem with uncertain prognosis  []  Acute illness with systemic symptoms  []  Acute complicated injury    Data and Risk  Independent Historian []  Parent as child too young to provide hx []  Family/Caregiver due to AMS/dementia []  EMS due to medical acuity/trauma []  Family/EMS due to behavioral health concern and for collateral []    External Data Reviewed previous workup and mgmt of patient's Diarrhea via  [x]  Previous [place] Notes/Labs/Imaging []  External Notes/Labs/Imaging  which were non-contributory unless documented otherwise in HPI and ED Course   Considered but decided against  []  CT Head/C-spine given [place] CT/NEXUS/PECARN criteria []  CTA Chest to r/o PE given PERC/Well's criteria []  CT AP to r/o appendicitis given PAS score or family discussion []  Hospitalization due to *  []    Discussed w/ ext HCP [x]  Consults, PCP/outpt specialists, nursing home. See ED Course for details.   SDOH Affecting Dx/Tx []  Insurance limiting specialist referral []  Housing instability limiting outpt mgmt   []  Financial insecurity limiting medication access []  Substance/ETOH use []    Care de-escalation []  Shared decision-making regarding de-escalation of care (e.x. DNR) or foregoing hospitalization-level of care due to patient wishes/goals of care   Interpretations See ED Course. [x]     If applicable, parenteral controlled substances, drug therapies requiring intensive monitoring for toxicity, and prescription drug management are documented in the the Medications section of this note. If applicable, major and minor procedures are documented separately in Procedure Notes.         ED Course/ Progress Notes / Reassessments     ED Course as of 01/31/24 1312   Thu Jan 30, 2024   1932 CBC with differential(!)  WBC 11.20 and absolute neutrophils 8.82, elevated iso infection vs inflammation  [ML]   2142 On my independent interpretation,   ECG shows NSR 95, no STE meeting modified Sgarbossa's criteria, diffuse STD similar in morphology to prev ECG [AL]   2155 Spoke with hospital triage, okay for bed request. [ML]   2242 Got SO: Pending admit. Diarrhea. Abdominal pain. Pending scan. Geri admit to [facility].  [CR]   2255 Pending sign out to medicine  [ML]   2307 Received signout from Dr. Ladora.   Pending: CTAP read > s/o med    In brief, 34F with history of COPD on 2L (at baseline), IBS, breast CA (no treatment) p/w acute on chronic diarrhea w/ vomiting. Geri doctor requested admit to [facility] med but beds full. Diffusely ttp on abd but no peritonitic  [DS]   2331 Patient has been updated with plan.  [ML]      ED Course User Index  [AL] Jama Jodie DEL., MD  [CR] Charlie Cyndee BRAVO., MD  [DS] Sholder, Toribio RAMAN., MD  [ML] Ladora Odella NOVAK., MD                    Laboratory Results     Labs Reviewed   BASIC METABOLIC PANEL - Abnormal; Notable for the following components:       Result Value    Total CO2 31 (*)     Glucose 100 (*)     Creatinine 1.61 (*)     Urea Nitrogen 45 (*)     All other components within normal limits   PHOSPHORUS - Abnormal; Notable for the following components:    Phosphorus 2.3 (*)     All other components within normal limits   MAGNESIUM  - Abnormal; Notable for the following components:  Magnesium  2.1 (*)     All other components within normal limits   CBC (PERFORMABLE) - Abnormal; Notable for the following components:    White Blood Cell Count 11.20 (*)     Red Cell Distribution Width-SD 56.0 (*)     Red Cell Distribution Width-CV 15.6 (*)     All other components within normal limits   DIFFERENTIAL, AUTOMATED (PERFORMABLE) - Abnormal; Notable for the following components:    Absolute Neut Count 8.82 (*)     Absolute Lymphocyte Count 1.28 (*)     Absolute Mono Count 0.86 (*)     Absolute Immature Gran Count 0.08 (*)     All other components within normal limits   LIPASE - Normal   HEPATIC FUNCT PANEL - Normal   POCT GLUCOSE - Normal   CBC & AUTO DIFFERENTIAL    Narrative:     The following orders were created for panel order CBC with differential.  Procedure Abnormality         Status                     ---------                               -----------         ------                     RAR[[id number]]                          Abnormal            Final result               Differential, Automated[[id number]]      Abnormal            Final result                 Please view results for these tests on the individual orders.       Imaging Results     CT abd+pelvis w contrast   Preliminary Result by Racheal Gleen PARAS., MD (12/05 0126)   IMPRESSION:      1.  No acute CT abnormality in the abdomen or pelvis.      2.  15 mm lucent lesion in the left acetabulum and is again seen, which may represent a subchondral cyst, but given history of breast cancer may be concerning for a new lytic lesion. Consider MRI for better characterization.      3.  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 The [employer] of Gastroenterology Guidelines recommend contrast    enhanced MRCP in one year to ensure stability.*      Note:    - Recommendation for asymptomatic patients with no increased risk of pancreatic cancer.   - Patients who are not medically fit for surgery should not undergo further evaluation of incidentally found pancreatic cysts, irrespective of cyst size.      *Reference: ACG Clinical Guideline: Diagnosis and Management of Pancreatic Cysts, Official Journal of the [employer] of Gastroenterology, April 2018.      THIS IS A PRELIMINARY REPORT THAT HAS NOT BEEN REVIEWED BY AN ATTENDING RADIOLOGIST.      Dictated by: Gleen Racheal  01/31/2024 1:26 AM          Consults     Consult Orders Placed This Encounter       Ordered     Status .    01/30/24 2145  Consult/Autopage to Medicine - Triage Hospitalist  Once        Provider:  (Not yet assigned)    Completed             Clinical Impressions           Diarrhea (Primary)  Nausea and vomiting, unspecified vomiting type  Immunosuppressed status  Heart failure with reduced ejection fraction LVEF <=40% (HCC/RAF)  Myasthenia gravis (HCC/RAF)  Irritable bowel syndrome with diarrhea  Chronic kidney disease, unspecified CKD stage       Disposition and Follow-up     Disposition: Transfer to [facility] [15] 01/31/2024 12:44 AM    Future Appointments   Date Time Provider Department Center   02/03/2024 11:00 AM Leavy Gruber, MD CRD DIS 2020 [facility]   02/06/2024  9:20 AM [name], MD GERI IMS [address] [place]/[facility]   02/06/2024  2:00 PM [name], MD GERI IMS [address] [place]/[facility]   03/31/2024 11:00 AM Ely Carte, MD HEM/ONC 230 [facility]   04/07/2024 11:00 AM SM 2020 ECHO 03 CARDIOLOGY CARDIAC IMAGING CI SM2020 [facility]       Follow up with:  No follow-up provider specified.    Return precautions are specified on After Visit Summary.    Discharge Medication List as of 01/31/2024  3:11 AM          Medications Administered This Encounter           Status .     potassium & sodium phosphates  280-160-250 mg pwd pkt 1 packet  Once         Last MAR action: Given      iodixanol  (Visipaque ) 320 mg/mL inj 90 mL  Once         Last MAR action: Given      sodium chloride  0.9% IV soln bolus 500 mL  Once         Last MAR action: Stopped                  iodixanol  (Visipaque ) 320 mg/mL inj 90 mL is a High-risk medication administered in this ED visit due to the patient's comorbid kidney disease/injury and/or known allergy/intolerance that require close physiologic and lab monitoring for toxicity.         Signature(s)   Signed:  Monica B. Ladora, MD, MPH  [facility] EM Resident, PGY-1  01/31/2024 1:12 PM          Ladora Odella NOVAK., MD  Resident  01/30/24 [phone]    ED Attending Attestation    I have discussed the management with the resident, was present with the resident during patient care services, have reviewed the resident note and agree with the documented findings and plan of care.    Jodie HILARIO Ruth, MD  01/31/2024 1:12 PM         Ruth Jodie DEL., MD  01/31/24 [phone]

## 2024-01-30 NOTE — ED Notes
 Per MD Ladora, pt ok to eat

## 2024-01-30 NOTE — Consults
 ED CM Note    Transfer [facility] due to patient will need private room for diarrhea. S/W patient and she agrees to transfer. S/W NTO MD and will admit under observation. CM will re-triage in am . CM will need to contact transfer team for request to transfer.     1:23am-S/W CWA and will arrange transport. Patient and MD updated regarding transferring to [facility]      Hardin Meager, RN, BSN  RN Case Manager - Per [employer] [facility]  Office: [phone]  Fax-[phone]

## 2024-01-30 NOTE — ED Summary
 [facility] NOTIFICATION 01/30/2024 14:50 [name], [name] DOB: May 05, 1935 MRN: [id number]    Criteria Met      CURES    Security and Safety  No Security Events were found.    ED Care Guidelines  There are currently no ED Care Guidelines for this patient. Please check your facility's medical records system.        Prescription Drug Report (12 Mo.)  Rx Details  Fill Date Drug Description Qty. Prescriber   2023-11-12 CLONAZEPAM /1 MG/TAB 135 DANETTA PENNE SOR MD DANETTA PENNE SOR MD    2023-09-05 CLONAZEPAM /1 MG/TAB 135 DANETTA PENNE SOR MD DANETTA PENNE SOR MD    2023-05-13 CLONAZEPAM /1 MG/TAB 135 DANETTA PENNE SOR MD DANETTA PENNE SOR MD    2023-02-14 CLONAZEPAM /1 MG/TAB 90 KORETZ, PENNE SOR MD DANETTA PENNE SOR MD      Rx Summary  Metric Count   Quantity Dispensed 495   Unique Prescribers 1   Unique Pharmacies 1       E.D. Visit Count (12 mo.)  Facility Visits   [name] 2   [name] 1   [place] 1   Total 4   Note: Visits indicate total known visits.     Recent Emergency Department Visit Summary  Date Facility [facility] [name] Type Diagnoses or Chief Complaint    Jan 30, 2024  [name]  Emergency     Jul 11, 2023  [facility]  [place]  Emergency      1. Laceration without foreign body of left index finger without damage to nail, initial encounter      1. Finger Injury      Jun 11, 2023  [facility].  [place]  Emergency      1. Chronic obstructive pulmonary disease with (acute) exacerbation      1. Shortness of breath      1. Shortness of Breath      Mar 27, 2023  [facility] [place].  [place]  Emergency      1. Diverticulitis of intestine, part unspecified, without perforation or abscess without bleeding      1. Unspecified abdominal pain        Recent Inpatient Visit Summary  Date Facility [facility] [name] Type Diagnoses or Chief Complaint    Jan 22, 2024  [facility].  [place]  Echo/Diagnostic Imaging 1. Chronic obstructive pulmonary disease with (acute) exacerbation      1. Acute respiratory failure with hypoxia      2. Unspecified systolic (congestive) heart failure      3. Acute and chronic respiratory failure with hypoxia      4. Myasthenia gravis without (acute) exacerbation      5. Shortness of breath      6. Mild persistent asthma with (acute) exacerbation      Jun 11, 2023  [facility].  [place]  Geriatrics      1. Unspecified systolic (congestive) heart failure      1. Chronic obstructive pulmonary disease with (acute) exacerbation      2. Shortness of breath      3. Heart failure, unspecified      4. Acute on chronic combined systolic (congestive) and diastolic (congestive) heart failure      4. Shortness of Breath      5. Acute respiratory failure with hypoxia      6. Myasthenia gravis  without (acute) exacerbation      7. Type 2 diabetes mellitus with diabetic chronic kidney disease      9. Sedative, hypnotic or anxiolytic dependence, uncomplicated        Care Team  No Care Team was found.  [facility]  This patient has registered at the [facility] Emergency Department  For more information visit: [email or web address]   PLEASE NOTE:     1.   Any care recommendations and other clinical information are provided as guidelines or for historical purposes only, and providers should exercise their own clinical judgment when providing care.    2.   You may only use this information for purposes of treatment, payment or health care operations activities, and subject to the limitations of applicable [facility] Policies.    3.   You should consult directly with the organization that provided a care guideline or other clinical history with any questions about additional information or accuracy or completeness of information provided.    ? 2025 [facility] - www.[email or web address]

## 2024-01-30 NOTE — Telephone Encounter
 Call Back Request      Reason for call back: Per [facility] is calling to notify pt has diarrhea and would like if MD can send medication to help     Phone- [phone]  Fax-[phone]    Any Symptoms:  []  Yes  [x]  No      If yes, what symptoms are you experiencing:    Duration of symptoms (how long):    Have you taken medication for symptoms (OTC or Rx):      If call was taken outside of clinic hours:    [] Patient or caller has been notified that this message was sent outside of normal clinic hours.     [] Patient or caller has been warm transferred to the physician's answering service. If applicable, patient or caller informed to please call us  back if symptoms progress.  Patient or caller has been notified of the turnaround time of 1-2 business day(s).

## 2024-01-30 NOTE — Telephone Encounter
 Call Back Request      Reason for call back: Pt called requesting to speak with [name]. Per pt, she is currently at [facility] the kitchen would not bring her food and feels like she is getting weaker and weaker. Pt wants to discuss about possibly going back to the hospital.     Any Symptoms:  []  Yes  [x]  No      If yes, what symptoms are you experiencing:    Duration of symptoms (how long):    Have you taken medication for symptoms (OTC or Rx):      If call was taken outside of clinic hours:    [] Patient or caller has been notified that this message was sent outside of normal clinic hours.     [] Patient or caller has been warm transferred to the physician's answering service. If applicable, patient or caller informed to please call us  back if symptoms progress.  Patient or caller has been notified of the turnaround time of 1-2 business day(s).

## 2024-01-31 ENCOUNTER — Inpatient Hospital Stay
Admission: EM | Admit: 2024-01-31 | Discharge: 2024-01-31 | Disposition: A | Discharge status: Still In Hospital | Payer: BLUE CROSS/BLUE SHIELD | Source: Home / Self Care | Attending: Student in an Organized Health Care Education/Training Program

## 2024-01-31 ENCOUNTER — Telehealth: Payer: BLUE CROSS/BLUE SHIELD

## 2024-01-31 ENCOUNTER — Inpatient Hospital Stay
Admission: EM | Admit: 2024-01-31 | Discharge: 2024-02-03 | Disposition: A | Discharge status: Home / Self Care | Payer: Commercial Managed Care - Pharmacy Benefit Manager | Source: Emergency Department

## 2024-01-31 DIAGNOSIS — N1832 Type 2 diabetes mellitus with stage 3b chronic kidney disease, without long-term current use of insulin (HCC/RAF): Secondary | ICD-10-CM

## 2024-01-31 DIAGNOSIS — N189 Chronic kidney disease, unspecified: Secondary | ICD-10-CM

## 2024-01-31 DIAGNOSIS — K5900 Constipation, unspecified: Principal | ICD-10-CM

## 2024-01-31 DIAGNOSIS — E1122 Type 2 diabetes mellitus with diabetic chronic kidney disease: Secondary | ICD-10-CM

## 2024-01-31 DIAGNOSIS — N1831 Stage 3a chronic kidney disease (HCC/RAF): Secondary | ICD-10-CM

## 2024-01-31 DIAGNOSIS — G7000 Myasthenia gravis without (acute) exacerbation: Secondary | ICD-10-CM

## 2024-01-31 DIAGNOSIS — C50811 Malignant neoplasm of overlapping sites of right female breast: Secondary | ICD-10-CM

## 2024-01-31 DIAGNOSIS — R197 Diarrhea, unspecified: Secondary | ICD-10-CM

## 2024-01-31 DIAGNOSIS — C50812 Malignant neoplasm of overlapping sites of left female breast: Secondary | ICD-10-CM

## 2024-01-31 DIAGNOSIS — Z170 Estrogen receptor positive status [ER+]: Secondary | ICD-10-CM

## 2024-01-31 DIAGNOSIS — I10 Essential (primary) hypertension: Secondary | ICD-10-CM

## 2024-01-31 DIAGNOSIS — K580 Irritable bowel syndrome with diarrhea: Secondary | ICD-10-CM

## 2024-01-31 LAB — Magnesium
MAGNESIUM: 2.1 meq/L — ABNORMAL HIGH (ref 1.4–1.9)
MAGNESIUM: 2 meq/L — ABNORMAL HIGH (ref 1.4–1.9)

## 2024-01-31 LAB — Lipase: LIPASE: 44 U/L (ref 13–69)

## 2024-01-31 LAB — CBC
NUCLEATED RBC%, AUTOMATED: 0 % (ref 4.16–9.95)
PLATELET COUNT, AUTO: 282 x10E3/uL (ref 143–398)

## 2024-01-31 LAB — Basic Metabolic Panel
CHLORIDE: 100 mmol/L (ref 96–106)
SODIUM: 140 mmol/L (ref 135–146)
SODIUM: 139 mmol/L (ref 135–146)
SODIUM: 139 mmol/L (ref 135–146)

## 2024-01-31 LAB — Hepatic Funct Panel
ALBUMIN FORHEPFUNCTPNL: 3.3 g/dL — ABNORMAL LOW (ref 3.9–5.0)
ALKALINE PHOSPHATASE: 64 U/L (ref 37–133)

## 2024-01-31 LAB — Influenza A B RSV PCR: INFLUENZA A PCR: NOT DETECTED

## 2024-01-31 LAB — Phosphorus
PHOSPHORUS: 2.3 mg/dL — ABNORMAL LOW (ref 2.5–4.5)
PHOSPHORUS: 2.7 mg/dL (ref 2.5–4.5)

## 2024-01-31 LAB — Differential Automated
ABSOLUTE BASO COUNT: 0.03 x10E3/uL (ref 0.00–0.10)
ABSOLUTE MONO COUNT: 0.8 x10E3/uL (ref 0.20–0.80)

## 2024-01-31 LAB — COVID-19 PCR

## 2024-01-31 MED ADMIN — SPIRONOLACTONE 12.5 MG PO TABS: 25 mg | ORAL | Stop: 2024-02-03

## 2024-01-31 MED ADMIN — TIOTROPIUM BROMIDE 2.5 MCG/ACT IN AERS: 2 | RESPIRATORY_TRACT | @ 23:00:00 | Stop: 2024-02-03

## 2024-01-31 MED ADMIN — ASPIRIN 81 MG PO TBEC: 81 mg | ORAL | @ 17:00:00 | Stop: 2024-02-03 | NDC 77333003125

## 2024-01-31 MED ADMIN — HEPARIN SODIUM (PORCINE) 5000 UNIT/ML IJ SOLN: 5000 [IU] | SUBCUTANEOUS | @ 17:00:00 | Stop: 2024-02-03 | NDC 00409272330

## 2024-01-31 MED ADMIN — PYRIDOSTIGMINE BROMIDE 60 MG PO TABS: 60 mg | ORAL | Stop: 2024-02-03 | NDC 68084049411

## 2024-01-31 MED ADMIN — PYRIDOSTIGMINE BROMIDE 60 MG PO TABS: 60 mg | ORAL | @ 17:00:00 | Stop: 2024-02-03 | NDC 68084049411

## 2024-01-31 MED ADMIN — METOPROLOL SUCCINATE ER 25 MG PO TB24: 25 mg | ORAL | Stop: 2024-02-03 | NDC 60687039011

## 2024-01-31 MED ADMIN — FLUTICASONE-SALMETEROL 100-50 MCG/ACT IN AEPB: 1 | RESPIRATORY_TRACT | @ 23:00:00 | Stop: 2024-02-03

## 2024-01-31 MED ADMIN — SODIUM CHLORIDE 0.9 % IV BOLUS: 500 mL | INTRAVENOUS | @ 03:00:00 | Stop: 2024-01-31 | NDC 00338004903

## 2024-01-31 MED ADMIN — DULOXETINE HCL 60 MG PO CPEP: 60 mg | ORAL | Stop: 2024-02-03 | NDC 68001059604

## 2024-01-31 MED ADMIN — NON-FORMULARY REQUEST: RESPIRATORY_TRACT | Stop: 2024-01-31

## 2024-01-31 MED ADMIN — FUROSEMIDE 20 MG PO TABS: 20 mg | ORAL | Stop: 2024-02-03 | NDC 00904717761

## 2024-01-31 MED ADMIN — POTASSIUM & SODIUM PHOSPHATES 280-160-250 MG PO PACK: 1 | ORAL | @ 07:00:00 | Stop: 2024-01-31 | NDC 60258000601

## 2024-01-31 MED ADMIN — IODIXANOL 320 MG/ML IV SOLN: 90 mL | INTRAVENOUS | @ 05:00:00 | Stop: 2024-01-31 | NDC 65219038310

## 2024-01-31 MED ADMIN — PNEUMOCOCCAL 20-VAL CONJ VACC 0.5 ML IM SUSY: .5 mL | INTRAMUSCULAR | @ 12:00:00 | Stop: 2024-02-03

## 2024-01-31 NOTE — Progress Notes
 GERIATRICS PROGRESS NOTE   Patient: [name]  MRN: [id number]  Date of Service: 01/31/2024  Admission Date: 01/31/2024    Hospital Day: 0  PCP: [name], MD  Attending Physician: Halford Slough, DO  Primary Resident: Vernell HERO. Kamori Kitchens, MD    CHIEF COMPLAINT   Admitted overnight.   See h&p for full history.     This am, no acute complaints. Reports able to tolerate small breakfast of french toast but had loose stool immediately afterward.     SUBJECTIVE/INTERVAL EVENTS     Patient is a [x]  reliable []  unreliable/limited historian.   If patient is deemed unreliable/limited, main reason(s) is/are []  altered mental status []  cognitive impairment and additional information was obtained from the following independent historian(s):     I reviewed these specialist notes that directly relate to the patient's acute and/or chronic medical problems with documentation of the salient findings, if any:   Last Cardiology Note    No notes exist for this encounter.       ,   Last Gastroenterology Note    No notes exist for this encounter.       ,   Last Hospitalist Note    No notes exist for this encounter.       , and   Last Neurosurgery Note    No notes exist for this encounter.         I have communicated with the following physicians regarding the patient?s care today. Discussed with overnight admitting team (include name of physicians and corresponding specialty) on 12/5 (date).    GERIATRIC ASSESSMENT     Geriatric-Specific Review of Systems:  Falls: ***  Nutrition: ***  Weight Loss: ***  Swallowing: ***  Dentures: ***  Baseline Cognition: ***  Incontinence: ***  Constipation: ***  Depression/Anxiety: ***  Hearing: ***  Vision: ***    Social History:  With whom does the patient live:     []  alone          []  adult child:   []  spouse or partner       []  other, specify:     Which best describes the patient's residence:    []  single family house    []  condo/apartment    []  board and care (B&C) or assisted living facility(ALF)  []  memory unit at ALF   []  nursing home   []  independent senior housing  []  need to navigate stairs?     Caregiver:    []  employed caregiver; if yes, indicate days/hours available:   []  family caregiver; if yes, indicate days/hours available:   []  none    Occupation/past occupation:     Functional Status (prior to acute illness):   Source/s of Information: [] Patient  [] Other: ***             Relationship:***     MOBILITY    Walking across the room [] unaided    [] cane              [] walker [] handheld    [] wheelchair [] motorized scooter [] unable    Climbing a flight of stairs [] independent  [] needs help [] unable [] n/a   ACTIVITIES OF DAILY LIVING Independent Needs help Who Helps     Feeding oneself []  []       Transferring  (getting from bed to chair) []  []       Getting to the toilet []  []       Toileting  []  []   Getting dressed []  []       Bathing or showering []  []     INSTRUMENTAL ACTIVITIES OF DAILY LIVING        Using the telephone []  []       Taking medications []  []       Preparing meals []  []       Managing money (paying bills, taxes, banking) []  []       Moderately strenuous housework  []  []       Doing laundry []  []       Shopping for personal items or groceries []  []       Driving []  []       Getting to places beyond walking distance    (taking a bus, cab, or car) []  []       MEDICATIONS       Allergies:  Beta Adrenergic Blockers  Levofloxacin  Macrolides And Ketolides  Sulfa Antibiotics  Botulinum Toxins  Pollen Extract  Statins  Plastic Tape    Home Medications:  Medications that the patient states to be currently taking   Medication Sig    ALBUTEROL  90 mcg/act inhaler INHALE 1-2 PUFF BY MOUTH EVERY 4 HOURS AS NEEDED FOR WHEEZING OR SHORTNESS OF BREATH    aspirin  81 mg EC tablet Take 1 tablet (81 mg total) by mouth daily.    azaTHIOprine  50 mg tablet Take 1.5 tablets (75 mg total) by mouth daily. (Patient taking differently: Take 1.5 tablets (75 mg total) by mouth two (2) times daily.)    BUPROPION , SR, 200 mg 12 hr tablet TAKE 1 TAB BY MOUTH TWICE DAILY (ANTIDEPRESSANT)    clonazePAM  1 mg tablet TAKE 1 AND HALF TABLET BY MOUTH AT NIGHT    DEXILANT  60 MG DR capsule Take 1 capsule (60 mg total) by mouth daily.    DULOXETINE  20 mg DR capsule TAKE 3 CAPSULES (60 MG TOTAL) BY MOUTH DAILY    EPINEPHrine  0.3 mg/0.3 mL auto-injector INJECT 0.3ML (0.3MG ) SUBCUTANEOUSLY AS NEEDED FOR ANAPHYLAXIS (ACUTE AND CHRONIC RESPIRATORY FAILURE WITH HYPOXIA)    exemestane  25 mg tablet Take 1 tablet (25 mg total) by mouth daily.    FARXIGA  10 MG tablet 1 TAB BY MOUTH EVERY DAY    fluticasone -vilanterol 100-25 mcg/act inhaler INHALE 1 PUFF BY MOUTH EVERY DAY (ACUTE AND CHRONIC RESPIRATORY FAILURE WITH HYPOXIA)    furosemide  20 mg tablet Take 1 tablet (20 mg total) by mouth daily.    LOSARTAN  100 mg tablet TAKE 1 TAB BY MOUTH EVERY DAY (HEART FAILURE, UNSPECIFIED)    metoprolol  succinate 25 mg 24 hr tablet Take 1 tablet (25 mg total) by mouth daily.    MONTELUKAST  10 mg tablet TAKE 1 TAB BY MOUTH EVERY EVENING (ACUTE AND CHRONIC RESPIRATORY FAYLURE WITH HYPOXIA)    PENICILLIN  V POTASSIUM 500 mg tablet TAKE 1 TAB BY MOUTH TWICE DAILY (BACTERIAL INFECTION)    PYRIDOSTIGMINE  60 mg tablet TAKE 1 TAB BY MOUTH THREE TIMES DAILY (MYASTHENIA GRAVIS)    spironolactone  25 mg tablet Take 1 tablet (25 mg total) by mouth daily.       Current Medications:  Scheduled:  aspirin , 81 mg, Oral, Daily  azaTHIOprine , 75 mg, Oral, Daily  DULoxetine , 60 mg, Oral, Daily  fluticasone -salmeterol, 1 puff, Inhalation, BID  furosemide , 20 mg, Oral, Daily  heparin , 5,000 Units, Subcutaneous, BID  metoprolol  succinate, 25 mg, Oral, Daily  montelukast , 10 mg, Oral, QPM  pneumococcal 20-valent vaccine, 0.5 mL, Intramuscular, Once  [START ON 02/01/2024] polyethylene glycol, 17 g, Oral, BID  pyRIDostigmine , 60  mg, Oral, Q8H  spironolactone , 25 mg, Oral, Daily  tiotropium bromide , 2 puff, Inhalation, Daily        PRN:  acetaminophen  **OR** acetaminophen  suppository, albuterol , bisacodyl, ondansetron  **OR** ondansetron  injection/IVPB, senna  Medications 01/30/24 01/31/24      acetaminophen  tab 650 mg  Dose: 650 mg  Freq: Every 6 hours PRN Route: PO  PRN Comment: temp >38.5  Start: 01/31/24 0340 End: 03/01/24 0339          Or   acetaminophen  650 mg supp 650 mg  Dose: 650 mg  Freq: Every 6 hours PRN Route: RE  PRN Comment: temp >38.5  Start: 01/31/24 0340 End: 03/01/24 0339          albuterol  90 mcg/act inh 2 puff  Dose: 2 puff  Freq: Every 4 hours PRN Route: IN  PRN Reasons: Wheezing,Shortness of Breath  Start: 01/31/24 0530 End: 02/07/24 0529          bisacodyl EC tab 5 mg  Dose: 5 mg  Freq: Daily PRN Route: PO  PRN Reason: Constipation  Start: 01/31/24 0340 End: 03/01/24 0339           ondansetron  tab 4 mg  Dose: 4 mg  Freq: Every 6 hours PRN Route: PO  PRN Reasons: Nausea,Vomiting  Start: 01/31/24 1230 End: 03/01/24 1229          Or   ondansetron  4 mg/2 mL inj 2 mg  Dose: 2 mg  Freq: Every 6 hours PRN Route: IV  PRN Reasons: Nausea,Vomiting  Start: 01/31/24 1230 End: 03/01/24 1229          senna tab 1 tablet  Dose: 1 tablet  Freq: Every night at bedtime PRN Route: PO  PRN Reason: Constipation  Start: 01/31/24 1233 End: 03/01/24 1232                     Infusions:         OBJECTIVE     Temp:  [36.6 ?C (97.8 ?F)-36.8 ?C (98.3 ?F)] 36.6 ?C (97.8 ?F)  Heart Rate:  [72-101] 76  Resp:  [15-23] 18  BP: (91-138)/(46-90) 134/55  NBP Mean:  [66-97] 77  SpO2:  [95 %-100 %] 97 %       IN'S AND OUT'S:  UOP:     I/Os:   Intake/Output Summary (Last 24 hours) at 01/31/2024 1332  Last data filed at 01/31/2024 1020  Gross per 24 hour   Intake 240 ml   Output --   Net 240 ml         ***    System Check if within normal limits Positive or additional negative findings   Constit  []  General appearance     Eyes  []  Conj/Lids  []  Pupils   []  Fundi     HENMT  []  External ears/nose    []  Gross Hearing   []  Lips/teeth/gums    []  Mucus membranes  []  Otoscopy     []  Nasal mucosa   []  Oropharynx    []  Head Neck  []  Inspection/palpation   []  Thyroid     Resp  []  Effort    []  Auscultation    []  Wheezing   []  Crackles        CV  []  Rhythm/rate   []  Murmurs   []  LE edema   []  JVP non-elevated     Normal pulses:   []  Radial            []   Femoral       []  Pedal    Breast  []  Inspection []  Palpation     GI  []  Bowel sounds    []  Soft   []  Nontender   []  Nondistended   []  Rebound/Guarding   []  Liver/spleen   []  Rectal     GU  M:    []  Scrotum    []  Penis   []  Prostate  []  Foley catheter in place   []  Suprapubic tube in place     F:     []  External   []  Cervix     []  Uterus       []  Vaginal wall  []  Mucus  []  Adnexa    Lymph  []  Neck  []  Axillae []  Groin     MSK Specify site examined:         []  Inspect/palp   []  Stability   []  Gait        []  ROM   []  Strength/tone   []  Back    Skin  []  Inspection   []  Palpation     Neuro  []  CN2-12 intact grossly   []  Alert and oriented   []  DTR      []  Muscle strength      []  Sensation   []  Balance     Psych  []  Insight/judgement     []  Mood/affect    []  Gross cognition          CAM DELIRIUM TEST:    Feature One:   A. Acute Change in mental status from baseline? []  yes []  no   B. Is there fluctuation in the mental status? []  yes []  no    Feature Two:  A. Does that patient have difficulty focusing attention?  []  yes []  no    Feature Three:  A.Is there evidence of disorganized thinking?  []  yes []  no    Feature Four:  A. Does the patient have an altered level of consciousness (ie: vigilant, lethargic, stuporous, comatous?  []  yes []  no    The diagnosis of delirium by CAM requires the presence of features 1 and 2 and either 3 or 4.     LABS: Labs reviewed today  CBC  Recent Labs     01/31/24  0645 01/30/24  1857   WBC 9.48 11.20*   HGB 11.3* 13.2   HCT 35.1 40.5   MCV 98.9* 97.4   PLT 226 282     BMP  Recent Labs     01/31/24  0645 01/30/24  1857   NA 139 140   K 4.3 4.1   CL 106 100   CO2 26 31*   BUN 41* 45*   CREAT 1.38* 1.61*   CALCIUM  8.8 9.3   MG 2.0* 2.1*   PHOS 2.7 2.3* LFT  Recent Labs     01/31/24  0645 01/30/24  1857   TOTPRO 5.8* 6.9   ALBUMIN 3.3* 4.0   BILITOT 0.5 0.6   BILICON <0.2 0.2   ALT 8 10   AST 21 19   ALKPHOS 51 64   LIPASE  --  44     Coags  No results for input(s): ''INR'', ''PT'', ''APTT'' in the last 72 hours.  UA:  No results for input(s): ''SPECGRAVUR'', ''PHUR'', ''BLDUR'', ''KETONESUR'', ''GLUCOSEUR'', ''PROTCLUR'', ''NITRITEUR'', ''LEUKESTUR'', ''RBCSUR'', ''WBCSUR'' in the last 72 hours.    ***    IMAGING: Imaging reviewed today  ***  CT abd+pelvis w  contrast  Result Date: 01/31/2024  IMPRESSION: 1.  No acute CT abnormality in the abdomen or pelvis. 2.  There is a 15 mm lucent lesion in the left acetabulum and is again seen, which may represent a subchondral cyst, but given history of breast cancer may be concerning for a new lytic lesion. Consider MRI for better characterization. 3.  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 The [employer] of [employer] Guidelines recommend contrast enhanced MRCP in one year to ensure stability.* Note: - Recommendation for asymptomatic patients with no increased risk of pancreatic cancer. - Patients who are not medically fit for surgery should not undergo further evaluation of incidentally found pancreatic cysts, irrespective of cyst size. *Reference: ACG Clinical Guideline: Diagnosis and Management of Pancreatic Cysts, Official Journal of the [employer], April 2018. I, Catheryn Para, M.D., have reviewed this radiological study personally and I am in full agreement with the findings of the report presented here. Dictated by: Gleen Isaacs   01/31/2024 3:21 AM Signed by: Catheryn Para   01/31/2024 3:36 AM      MICROBIOLOGY:  ***  Recent Results (from the past 24 hours)   COVID-19 PCR, Nasopharyngeal    Collection Time: 01/31/24  6:24 AM    Specimen: Nasopharyngeal; Respiratory, Upper   Result Value Ref Range    Specimen Type Respiratory, Upper     COVID-19 PCR/TMA Not Detected Not Detected   Influenza A/B RSV PCR, Respiratory Upper    Collection Time: 01/31/24  6:24 AM    Specimen: Nasopharyngeal; Respiratory, Upper   Result Value Ref Range    Influenza A PCR Not Detected Not Detected    Influenza B PCR Not Detected Not Detected    RSV PCR Not Detected Not Detected       INDEPENDENT INTERPRETATION OF TESTS:  ***      ASSESSMENT AND PLAN       [name] is a 88 y.o. female with a history of myasthenia gravis, IBS, diverticulitis, breast cancer, COPD, HFrEF, chronic hypoxic respiratory failure (on 2L NC), CKD stage IV, CAD, GERD presenting with diarrhea.     Acute Problems:     #Acute on Chronic Diarrhea  #Significant stool burden on imaging suspicious for Overflow Diarrhea   #Nausea/Vomiting  #h/o irritable bowel disease   Acute worsening of pt's chronic diarrhea with associated n and vomiting at times. No s/s infection. Medication review notable for being out of her Dexilant  since September (PPI). Also is on pyridostigmine  60mg  TID  and azathioprine  both which causes diarrhea n/v and abd pain (unclear if compliant with azathiaprine). CTAP with substantial stool burden.   - miralax  BID, senna BID, bisacodyl prn, advance as tolerated  - zofran  prn (chosen after review of side effect profiles of alternative antinausea medications)  - increase mobility as possible   - if additional agents needed, consider lactulose, golytely  less ideal given h/o HFrEF     #pancreatic cyst  ***     #Elevation in Cr (not meeting AKI) - Cr 1.6 (from bl 1.4)   #Hypophosphatemia - Phos 2.3  Suspect patient hypovolemic in setting of diarrhea/vomiting and poor PO intake. S/p 500cc NS in [facility] ED. Will hold off on additional fluid given her EF of 20%.         #63mm lucent lesion of the left acetabulum   Seen again, may be subchondral  cyst, but concern for lytic lesion given hx breast cancer     -Chronic/Stable-  #COPD               -continue spiriva , albuterol  q4h prn, and fluticasone - vilanterol 100-25     #Chronic hypoxic respiratory failure - 2L NC baseline      #HFrEF (EF 20%) - does not appear volume up on exam   Home meds: lasix  20mg  every day, metoprolol  succinate 25mg  daily, losartan  100mg  and farxiga  10mg    -  plan for pacemaker placement 03/03/24               -Held HFrEF meds overnight, likely can restart since HDUS     #Myasthenia Gravis - cont home pyridostigmine  60mg  TID and azathioprine . Avoid magesium, caution with betablockers, caution with azithromycin,              -NIFS daily     #B/l Breast Cancer -   -held exemestane  25mg  daily - non formulary      #GERD   -held pantoprazole  40mg  daily given QTC      #Major Depression   #Chronic Benzo Use   - home med: clonazepam  1.5mg  at bedtime (not taking), duloxetine  60mg  (not taking) daily, and bupropion  150mg  BID (not taking)              -held all       I have seen and examined the patient and agree with the RD assessment and plan. See RD note from this hospital admission for additional details.                Chronic Problems:  ***    Prophylaxis:   VTE prophylaxis: {Blank single:19197::''warfarin'',''fondaparinux'',''not indicated as pt is already anticoagulated with enoxaparin'',''not indicated as pt is already anticoagulated with NOAC'',''not indicated as pt is already anticoagluated on heparin  gtt'',''not indicated as pt is already anticoagulated with warfarin'',''not indicated as pt is mobile and expected LOS is <2 days'',''enoxaparin'',''subcutaneous heparin '',''SCDs''}  GI prophylaxis: ***    CODE STATUS: DNR (No CPR, defibrillation, intubation)    Current Ambulatory Status & Fall Risk:  []  non-ambulatory  []  walks with cane and/or walker  []  walks independently  []  walks independently but may benefit from an assistive device evaluation with physical therapy  []  needs supervision with walking secondary to poor safety awareness    SDoH  The diagnosis or treatment of said conditions is significantly limited by the following social determinants of health:  []  Z59.0 Homelessness  []  Z59.1 Inadequate housing  []  Z60.2 Problems related to living alone  []  Z59.7 Insufficient social insurance and welfare support  []  Z59.9 Problems related to housing and economic circumstances, unspecified  []  Z62.820 Parent-biological child conflict  []  Z63.8 Other specified problems related to primary support group  []  Z55.0 Illiteracy and low level literacy     Discharge Planning Considerations:  []  Physical Therapy (PT)/Occupational Therapy (OT) consult inpatient to evaluate for Durable Medical Equipment (DME) and other adaptive home equipment recommendations, or to assess if appropriate for Skilled Nursing Facility (SNF) or Acute Rehab Unit (ARU) for short-term rehabilitation  []  Social Work consult to evaluate home support, caregiver resources, social barriers to discharge, or if appropriate, for custodial/long-term nursing home (NH) placement or new Assisted Living Facility (ALF) or Board and Care (B&C) placement, or non-medical transport upon discharge  []  Case Management consult for Home Health needs, home IV antibiotics, above Durable Medical Equipment (DMEs), Skilled Nursing Facility (SNF) placement for short-term, skilled services,  Acute Rehab Unit (ARU) placement, or ambulance transport upon discharge    Anticipated Discharge Site:  []  Return home independently  []  Return home with personal care assistance by family or employed caregiver  []  Return to Assisted Living Facility (ALF) or Board & Care (B&C)  []  Return to Nursing Home (NH) for custodial/long-term care  []  Skilled Nursing Facility (SNF) for short-term, post-acute skilled services (rehab, IV therapy, complex wound care, or new G-tube management)  []  Acute Rehab Unit (ARU)  []  To be determined, pending clinical course     Anticipated Discharge Needs:  []  Home Health [RN with or without Physical Therapy, Occupational Therapy, bath aide, Medical Child Psychotherapist (MSW)]  []  Horticulturist, Commercial (DME) for home discharges (if patient does not already own one):               []  cane              []  home O2      []  enteral pump & formula          []  other:               []  walker           []  nebulizer       []  suction machine                     []  none or to be determined               []  wheelchair    []  BiPap            []  wound vac                []  commode     []  pneumovest  []  low air mattress (for pressure injuries stage 3 or worse)               []  hospital bed  []  hoyer lift        []  pleurX  []  Skilled Nursing Facility (SNF) placement for short-term, post-acute skilled services  []  Acute Rehab Unit (ARU) placement  []  New nursing home (NH) placement for custodial/long-term care  []  New Assisted Living Facility (ALF) or Board and Care (B&C) placement  []  Personal care assistance by family or employed caregiver  []  None     Findings and recommended plan of care discussed with patient/surrogate:   Extended Emergency Contact Information  Primary Emergency Contact: [facility]  Mobile Phone: [phone]  Relation: Other  Secondary Emergency Contact: Beutner,Virgina  Address: [address]           [place], [place] [address] [place]  Home Phone: [phone]  Mobile Phone: [phone]  Relation: Daughter  Preferred language: English    Patient will be discussed with geriatrics attending Dr. FERNAND    Vernell HERO. Lyrika Souders, MD

## 2024-01-31 NOTE — Consults
 NUTRITION ASSESSMENT (Adult)    Admit Date: 01/31/2024     Date of Birth: 1935-06-30 Gender: female MRN: [id number]     Date of Assessment: 01/31/2024   Indication:     MST 2 or higher   Subjective: Attempted to meet with pt, unavailable at time of visit with provider. Chart reviewed. Per H&P, pt reporting decreased tolerance to PO intake since 10/2023 d/t ongoing abdominal pain and diarrhea.Since admission, has been on 2 gram Na restricted diet and consuming >75% of 1 documented meal.       Past Medical History:   Diagnosis Date    Asthma     Breast cancer (HCC/RAF) 12/20/2018    CHF (congestive heart failure) (HCC/RAF)     COPD (chronic obstructive pulmonary disease) (HCC/RAF)     Diabetes mellitus (HCC/RAF)     Renal disorder     No past surgical history on file.       Labs and Medications reviewed     Diet Info   Allergies:   Beta adrenergic blockers, Levofloxacin, Macrolides and ketolides, Sulfa antibiotics, Botulinum toxins, Pollen extract, Statins, and Plastic tape  Cultural/Ethnic/Religious/Other Food Preferences:  Unable to Assess     Nutrition Regimen PTA:  UTA, per H&P pt reporting decreased tolerance to PO intake since 10/2023 d/t ongoing abdominal pain and diarrhea  Current diet order:     Diets/Supplements/Feeds   Diet    Diet 2 gram sodium     Start Date/Time: 01/31/24 0510      Number of Occurrences: Until Specified     PO % consumed: 76 to 100% (x1 documented meal)  Nutrition Support: none  Other caloric sources: none     Anthropometrics:   Height: 162.6 cm (5' 4.02'')  Admit Weight: 58.5 kg (128 lb 15.5 oz) (01/31/24 0343)  Current Weight: 58.8 kg (129 lb 10.1 oz)  BMI: 22.24  IBW: 54 kg/120 lbs  IBW %: 108  Adj BW (if applicable): 55 kg  UBW:    UBW %:         Weight History:   Wt Readings from Last 1 Encounters:   01/31/24 0351 58.8 kg (129 lb 10.1 oz)   01/31/24 0350 58.8 kg (129 lb 10.1 oz)   01/31/24 0349 58.8 kg (129 lb 10.1 oz)   01/31/24 0343 58.5 kg (128 lb 15.5 oz)     Wt Readings from Last 10 Encounters:   01/31/24 58.8 kg (129 lb 10.1 oz)   01/30/24 59 kg (130 lb)   01/23/24 61.1 kg (134 lb 12.8 oz)   01/21/24 61.7 kg (136 lb)   12/03/23 62.2 kg (137 lb 3.2 oz)   10/31/23 63 kg (139 lb)   10/09/23 63 kg (139 lb)   09/24/23 62.1 kg (137 lb)   09/04/23 62 kg (136 lb 9.6 oz)   08/08/23 64.9 kg (143 lb)        Estimated Nutrition Needs   Based on wt: 58.8 kg  Calories: 25 - 30 cal/kg = 1470 - 1764 kcal/day  Protein: 1.2 - 1.5 g pro/kg = 70.56 - 88.2 g pro/day     Diet Education   To be provided prior to discharge          Malnutrition Assessment   Malnutrition in the Context of: Chronic   Energy Intake: < or = 75% of estimated energy requirement for > or =1 month; Supportive data:  reporting inability to tolerate PO intake since 10/2023 d/t worsening abdominal pain and diarrhea  Weight Loss: Does not meet criterion    Nutrition-Focused Physical Exam: 01/31/2024  Not performed: pt unavailable at time of RD visit       Patient meets criteria for: Unable to determine malnutrition status at this time    Nutrition Assessment   Anthropometrics: Body mass index is 22.25 kg/m?. - underweight for geriatrics. UBW: unknown. Per EMR review, weight loss of 9.3% over the past 6 months, not clinically significant. No documented edema.     Nutrition and Tolerance: Currently on 2 gram Na restricted diet, consuming >75% of 1 documented meal so far this admission.    PTA:  Per H&P, reporting inability to tolerate PO intake since 10/2023 d/t worsening abdominal pain and diarrhea     GI: Last documented BM: 12/4  Patient is on bowel regimen per team.     Labs: BUN/Cr elevated (h/o CKD stage IV), Na, K, Phos WNL, Mg elevated   LFTs WNL on 01/31/24  A1c 6% on 04/17/23    Micronutrients: no recent levels checked   Current Supplementation: none    Skin: intact     Recommendations / Care Plan        Continue 2 gram Na restricted diet   - Limit concentrated sweets/juices d/t concern for refeeding syndrome     Oral Nutrition Supplement: monitor PO intake if consuming less than 50% of meals recommend Boost Glucose Control BID    Micronutrients   - Recommend nephro-vite daily    - Recommend 100 mg thiamine daily x7 days   - Recommend checking CRP level, if less than 2 check Vitamin D and Zinc level     Daily BMP, Mg, Phos to monitor for refeeding syndrome   - Medical team to replace electrolytes as needed     Weekly weights to monitor trends     Author: Rosina Deeds MS, RD, CNSC pgr [phone]  01/31/2024 2:25 PM       Pended orders for Team  RD to monitor and follow-up per nutrition policy

## 2024-01-31 NOTE — Nursing Clinical Note
 Skin check completed w/ Martina and skin is intact. Pt refused CHG.

## 2024-01-31 NOTE — Telephone Encounter
 ERROR

## 2024-01-31 NOTE — H&P
 [facility] OF MEDICINE   INPATIENT NOTE TEMPLATE   DIVISION OF HOSPITAL MEDICINE  RESIDENT H&P NOTE     MEDICINE HISTORY & PHYSICAL     PATIENT: [name]  MRN: [id number]  DOB: 04-16-1935  DATE OF SERVICE: 01/31/2024     REFERRING PRACTITIONER: No ref. provider found  PRIMARY CARE PROVIDER: [name], MD    CHIEF COMPLAINT: No chief complaint on file.       HPI:     [name] is a 88 y.o. female with  pmh  myasthenia gravis, IBS, diverticulitis, breast cancer, COPD, HFrEF, acute on chronic hypoxic respiratory failure (on 2L NC), CKD stage IV, CAD, GERD who is presenting for Diarrhea as a transfer from the [facility] ED.     Pt reports she is having multiple episodes of diarrhea as well as nausea and vomiting since September 2025. Since her discharge  ~ 11/27 from [facility], she notes  diarrhea which keep her up until 4am. She is having difficulty tolerating food due to having diarrhea immediately afterwards  .Has had significant abdominal pain in her lower abdomen. Notes that diarrhea has been worsening since September when she ran out of her Dexilant  for IBS as it is no longer covered by insurance. Getting this out of pocket has been cost prohibitive. Pt is not taking her other home meds consistently due to her n/v/d. Has not been taking her depression medications, (wellbuturin, duloxetine ), Still taking her inhalers. Does not remember which other medications she is taking.     Today 12/4-12/5 had 2 Bms that were non bloody and brown. At the peak of her diarrhea she was on the toilet constantly. No lightheadedness. Occasionally diarrhea is yellow.  No travel for 3 months out of the state. No lightheadedness or syncope. A relative around her had covid recently.     Denies hematochezia, fevers/chills, worsening SOB/CP, headaches, hematuria, dysuria.      Pt was recently hospitalized 11/25-11/27 for COPD exacerbation treated w/ duonebs and pred 40 x 5 days.     In [facility] ED   she was afebrile, BP 133/60, HR 90, Sat 99% on 2L. CBC w/ WBC 11.2. BMP w/ bicarb 31, Gluc 100, Cr 1.61 (bl ~1.4) and BUN 45. Phos low at 2.3. Mag 2.1. Lipase wnl. LFTs wnl. CT A/P completed w/o acute abnormalities. Received 500cc NS in ED.       Review of Systems:   A 14-system review of systems was performed and is negative except as stated in the history of present illness.    PMHx:      Past Medical History:   Diagnosis Date    Asthma     Breast cancer (HCC/RAF) 12/20/2018    CHF (congestive heart failure) (HCC/RAF)     COPD (chronic obstructive pulmonary disease) (HCC/RAF)     Diabetes mellitus (HCC/RAF)     Renal disorder        PSHx:   No past surgical history on file.    FamHx:    No family history on file.    SocHx:     Social History     Socioeconomic History    Marital status: Widowed   Tobacco Use    Smoking status: Former     Current packs/day: 0.00     Types: Cigarettes     Quit date: 1985     Years since quitting: 40.9    Smokeless tobacco: Former   Substance and Sexual Activity  Alcohol use: Never    Drug use: Never    Sexual activity: Not Currently     Social Drivers of Health     Financial Resource Strain: Low Risk (06/12/2023)    Financial Resource Strain     Difficulty of Paying Living Expenses: Not hard at all       Medications:      Current Facility-Administered Medications:     acetaminophen  tab 650 mg, 650 mg, Oral, Q6H PRN **OR** acetaminophen  650 mg supp 650 mg, 650 mg, Rectal, Q6H PRN, Cesario Clarita HERO., MD    albuterol  90 mcg/act inh 2 puff, 2 puff, Inhalation, Q4H PRN, Cesario Clarita HERO., MD    aspirin  EC tab 81 mg, 81 mg, Oral, Daily, Cesario Clarita HERO., MD    azaTHIOprine  tab 75 mg, 75 mg, Oral, Daily, Cesario Clarita HERO., MD    bisacodyl EC tab 5 mg, 5 mg, Oral, Daily PRN, Cesario Clarita HERO., MD    heparin  5000 unit/mL inj 5,000 Units, 5,000 Units, Subcutaneous, BID, Cesario Clarita HERO., MD    montelukast  tab 10 mg, 10 mg, Oral, QPM, Cesario Clarita HERO., MD    non formulary 1 puff, 1 puff, Inhalation, Daily, Cesario Clarita HERO., MD    pneumococcal 20-valent vaccine (Prevnar 20) inj 0.5 mL, 0.5 mL, Intramuscular, Once, Auto-Referral, Nursing    pyRIDostigmine  tab 60 mg, 60 mg, Oral, Q8H, Cesario Clarita HERO., MD    senna tab 1 tablet, 1 tablet, Oral, QHS PRN, Cesario Clarita HERO., MD    tiotropium bromide  2.5 mcg/act inh 2 puff, 2 puff, Inhalation, Daily, Cesario Clarita HERO., MD    Allergies:     Allergies   Allergen Reactions    Beta Adrenergic Blockers Other (See Comments)     Avoid d/t Myasthenia Gravis  Avoid d/t Myasthenia Gravis      Levofloxacin Other (See Comments)     Flare of Myasthenia Gravis    Macrolides And Ketolides Other (See Comments)     Avoid d/t Myasthenia Gravis  Use with caution d/t Myasthenia Gravis  Use with caution d/t Myasthenia Gravis      Sulfa Antibiotics Other (See Comments)     May possibly have caused deafness in one ear  May possibly have caused deafness in one ear  other      Botulinum Toxins Other (See Comments)     Muscle weakness r/t Myasthenia Gravis  Muscle weakness r/t Myasthenia Gravis      Pollen Extract Wheezing    Statins Other (See Comments)     Muscle aches  Muscle aches  Muscle aches      Plastic Tape Itching and Other (See Comments)     Turns the skin red where applied       Objective:     Vital Signs:   Temp:  [36.7 ?C (98 ?F)-36.8 ?C (98.3 ?F)] 36.7 ?C (98 ?F)  Heart Rate:  [72-101] 72  Resp:  [15-23] 17  BP: (91-138)/(46-65) 91/65  NBP Mean:  [66-84] 72  SpO2:  [95 %-100 %] 95 %    Oxygen Support:  Oxygen Therapy  SpO2: 95 %  O2 Device: None (Room air)  Flow Rate (L/min): 2 L/min           System Normal Findings Abnormal Findings   General [x]  Normal general appearance   []  In acute distress  []  Chronically ill appearing  []  Sedated           []  Non Verbal  []  Other:  HENMT/Neck []  Oropharynx clear and moist  [x]  Normal JVP []  Dry mucous membranes  []  JVP ( cm)  []  Other:   Resp [x]  CTA B/L []  Intubated                 []  On Supp O2 ()  []  Wheezing ()  []  Rales ()   []  Crackles ()  []  Other:   CV [x]  Normal rhythm/rate  [x]  No murmurs  [x]  No lower extremity edema  []  Normal PMI   Normal pulses:   [x]  Radial []  Femoral  []  Pedal []   Abnormal rhythm ()  []   Murmur ()  []  S3    []  S4  Abnormal Pulses:  []  Radial ()  []  Femoral ()   []  DP ()  []  PT ()  []  Lower extremity edema       []  R ()   []  L ()  []  Bilateral ()  []  Other:   GI [x]  Soft  []  No tenderness to palpation  [x]  No rebound/guarding [x]  Tenderness (mild diffuse tenderness )  []  Fluid Wave  []  Hepatomegaly  Other:   MSK [x]  Normal strength/tone []  Other:    Skin [x]  No rash []  Other:   Neuro [x]  AO x 3  [x]  Normal strength  [x]  Normal sensation  []  Normal reflexes  []  Normal cerebellar exam []  AO x 0       []  AO x 1     []  AO x 2  Abnormal neuro exam:  []  Abnormal strength  []  Abnormal sensation  []  Abnormal reflexes  []  Abnormal cerebellar exam       Labs  (click to expand/collapse)    CBC  Recent Labs     01/31/24  0645 01/30/24  1857   WBC 9.48 11.20*   HGB 11.3* 13.2   HCT 35.1 40.5   MCV 98.9* 97.4   PLT 226 282     BMP  Recent Labs     01/30/24  1857   NA 140   K 4.1   CL 100   CO2 31*   BUN 45*   CREAT 1.61*   CALCIUM  9.3   MG 2.1*   PHOS 2.3*     LFT  Recent Labs     01/30/24  1857   TOTPRO 6.9   ALBUMIN 4.0   BILITOT 0.6   BILICON 0.2   ALT 10   AST 19   ALKPHOS 64   LIPASE 44       Coags  No results for input(s): ''INR'', ''PT'', ''APTT'', ''FIBRINOGEN'' in the last 72 hours.    Glucose  Recent Labs     01/30/24  1857   GLUCOSE 100*     Additional Labs:  A1C = No results for input(s): ''HGBA1C'' in the last 72 hours., B12/Folate = No results for input(s): ''VITAMINB12'', ''FOLATE'' in the last 72 hours., BNP = No results for input(s): ''BNP'' in the last 72 hours., Iron Panel = No results for input(s): ''FERRITIN'', ''FE'', ''TIBC'', ''FEBINDSAT'' in the last 72 hours., Lipid Panel = No results for input(s): ''CHOL'', ''CHOLDLCAL'', ''TRIGLY'' in the last 72 hours., Tacro = No results for input(s): ''TACROLIMUS'' in the last 72 hours., TSH = No results for input(s): ''TSH'' in the last 72 hours., Trop = No results for input(s): ''TROPONIN'' in the last 72 hours., Siro = No results for input(s): ''SIROLIMUS'' in the last 72 hours., and Lactate =  Micro    No results found for this or any previous visit (from the past week).    Radiology    CT abd+pelvis w contrast  Result Date: 01/31/2024  CT ABD+PELVIS W CONTRAST CLINICAL HISTORY: abd pain, r/o appy. Per focused EMR review: Acute on chronic diarrhea, nausea/vomiting. Tender abdomen. COMPARISON: CT abdomen pelvis 11/01/2023 TECHNIQUE: On a multirow-detector CT scanner, a volumetric contrast enhanced scan was performed through the abdomen and pelvis. CONTRAST: iodixanol  (Visipaque ) 320 mg/mL inj 90 mL RADIATION DOSE: The patient received the following exposure event(s) during this study, and the dose reference values for each are as shown (CTDIvol in mGy, DLP in mGy-cm). Note that the values are not patient dose but numbers generated from scan acquisition factors based on 32 cm (L) and/or 16 cm (S) phantoms and may substantially under-estimate or over-estimate actual patient dose based on patient size and other factors. PreMonitoring, CTDI(L): 4, DLP: 3.9;Abd/Pel wc, CTDI(L): 6.9, DLP: 353.7 FINDINGS: Lower chest: Unremarkable. Liver: Normal density and contour. Gallbladder and bile ducts: Surgically absent gallbladder with mildly dilated bile ducts, likely related to postcholecystectomy state. Spleen: Normal in size. Pancreas: Cystic focus measuring 11 mm in the uncinate process (4-45). No main pancreatic ductal dilation. Adrenals: Unremarkable. Kidneys and ureters: Normal renal enhancement. No hydronephrosis. Bilateral simple appearing renal cysts and subcentimeter hypodensities too small to characterize but likely additional cysts. Bowel: Small hiatal hernia with surgical clips. Extensive predominantly left sided colonic diverticulosis with associated long segment sigmoid colonic wall thickening without surrounding stranding, likely representing sequelae of chronic diverticular disease. Normal appendix (5-76, 6-111). Bladder: Unremarkable. Reproductive organs: Unremarkable. Lymph nodes: Unremarkable. Peritoneum: No free air, free fluid, or fluid collections. Vessels: Extensive atherosclerosis with soft plaque resulting in significant focal narrowing of the infrarenal abdominal aorta and left common iliac artery, overall stable compared to 11/01/2023. Abdominal wall: Unremarkable. Bones: Multilevel degenerative changes of the lumbar spine. There is a 15 mm lucent lesion in the left acetabulum (4-127).     IMPRESSION: 1.  No acute CT abnormality in the abdomen or pelvis. 2.  There is a 15 mm lucent lesion in the left acetabulum and is again seen, which may represent a subchondral cyst, but given history of breast cancer may be concerning for a new lytic lesion. Consider MRI for better characterization. 3.  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 The [employer] of [employer] Guidelines recommend contrast enhanced MRCP in one year to ensure stability.* Note: - Recommendation for asymptomatic patients with no increased risk of pancreatic cancer. - Patients who are not medically fit for surgery should not undergo further evaluation of incidentally found pancreatic cysts, irrespective of cyst size. *Reference: ACG Clinical Guideline: Diagnosis and Management of Pancreatic Cysts, Official Journal of the [employer], April 2018. I, Catheryn Para, M.D., have reviewed this radiological study personally and I am in full agreement with the findings of the report presented here. Dictated by: Gleen Isaacs   01/31/2024 3:21 AM Signed by: Ardalan Tangestanipoor   01/31/2024 3:36 AM      Additional studies   None    Independent Review of Studies   None    I reviewed these notes that directly relate to the patient's acute and/or chronic medical problems with documentation of the salient findings if any:  Inpatient notes:  Last Hospitalist Note    No notes exist for this encounter.  Assessment & Plan:     RENLEY GUTMAN is a 88 y.o. female with a history of myasthenia gravis, IBS, diverticulitis, breast cancer, COPD, HFrEF, chronic hypoxic respiratory failure (on 2L NC), CKD stage IV, CAD, GERD presenting with diarrhea.      #Acute on Chronic Diarrhea  #Nausea/Vomiting  #IBS  #Leukocytosis - WBC 11  Acute worsening of pt's chronic diarrhea with associated n/v. May be secondary to pt being out of her Dexilant  since September. Also is on pyridostigmine  60mg  TID  and azathioprine  both which causes diarrhea n/v and abd pain.   but pt also w/ mild leukocytosis and abdominal tenderness on exam. CT A/P w/o acute abnormalities.    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 This has been re demonstrated. Recommendation is to get a contrast MRCP once  a year for asymptomatic patients with no increased risk of pancreatic cancer.     #Elevation in Cr (not meeting AKI) - Cr 1.6 (from bl 1.4)   #Hypophosphatemia - Phos 2.3  Suspect patient hypovolemic in setting of diarrhea/vomiting and poor PO intake. S/p 500cc NS in [facility] ED. Will hold off on additional fluid given her EF of 20%.       #48mm lucent lesion of the left acetabulum   Seen again, may be subchondral cyst, but concern for lytic lesion given hx breast cancer    -Chronic/Stable-  #COPD    -continue spiriva , albuterol  q4h prn, and fluticasone - vilanterol 100-25    #Chronic hypoxic respiratory failure - 2L NC baseline      #HFrEF (EF 20%) - does not appear volume up on exam   Home meds: lasix  20mg  every day, metoprolol  succinate 25mg  daily, losartan  100mg  and farxiga  10mg    -  plan for pacemaker placement 03/03/24    -Held HFrEF meds overnight, likely can restart since HDUS     #Myasthenia Gravis - cont home pyridostigmine  60mg  TID and azathioprine . Avoid magesium, caution with betablockers, caution with azithromycin,   -NIFS daily     #B/l Breast Cancer -   -held exemestane  25mg  daily - non formulary      #GERD   -held pantoprazole  40mg  daily given QTC      #Major Depression   #Chronic Benzo Use   - home med: clonazepam  1.5mg  at bedtime (not taking), duloxetine  60mg  (not taking) daily, and bupropion  150mg  BID (not taking)   -held all      I have seen and examined the patient and agree with the RD assessment and plan. See RD note from this hospital admission for additional details.             # IP Checklist  - Diet: Diet 2 gram sodium  - VTE PPX: Prophylactic Heparin   - LDA:   Peripheral IV 22 G Right Antecubital (1)    Advance Care Planning:   DNR (No CPR, defibrilation, intubation) 24HR,  Primary Emergency Contact: Beutner,[place], Mobile Phone: [phone]    Disposition:  Expected post-hospitalization disposition will be to SNF.    Discussed with Halford Slough, DO    Author:    Clarita HERO. Felice Deem  01/31/2024 7:24 AM

## 2024-01-31 NOTE — End of Shift Summary
 Patient's Clinical Goal:   Clinical Goal(s) for the Shift: VSS, labs, I/O, comfort, safety  Identify possible barriers to advancing the care plan: None  Stability of the patient: Moderately Stable - low risk of patient condition declining or worsening   Progression of Patient's Clinical Goal:   Neuro: Within Defined Limits   BMAT: Level 3 - Yellow  Assistive Device:      Vitals:    Temp:  [36.7 ?C (98.1 ?F)-36.8 ?C (98.3 ?F)] 36.8 ?C (98.3 ?F)  Heart Rate:  [81-101] 87  Resp:  [15-23] 17  BP: (100-138)/(46-64) 122/50  NBP Mean:  [66-84] 74  SpO2:  [97 %-100 %] 98 %  Cardiac:   Tele monitor  Ectopy:   Respiratory:  Nasal cannula   monitored  2 L/min  Pain:    Last BM: 01/31/24  GU: Voiding  Skin: Within Defined Limits  Lines/Tubes/Drains:   Peripheral IV 22 G Right Antecubital (1)     Shift Events/Acute Changes:   Admission from [facility] ED  Skin check completed w/ RN wei  Admission questions  Pt belongings  Covid and influenza nasal samples sent  To Do/Pending:  Plan pending       Nursing Considerations for Discharge:     Expected Discharge Disposition:       Expected Discharge Date: 02/03/2024

## 2024-01-31 NOTE — Consults
 Triage Hospitalist Note    PATIENT NAME:  [name]  AGE: 88 y.o.  MRN:  [id number]  DOB:  04/07/1935    DATE OF SERVICE:  01/31/2024\    CONSULTING PROVIDER: Grover Gore, MD    CHIEF COMPLAINT: Diarrhea     SUBJECTIVE:  [name] is a 88 y.o. female with a history of myasthenia gravis, IBS, diverticulitis, breast cancer, COPD, HFrEF, acute on chronic hypoxic respiratory failure (on 2L NC), CKD stage IV, CAD, GERD presenting with diarrhea.     Pt reports she is having multiple episodes of diarrhea as well as nausea and vomiting which keep her up until 4am. She is having difficulty tolerating food. Has had significant abdominal pain in her lower abdomen. Notes that diarrhea has been worsening since September when she ran out of her Dexilant  for IBS as it is no longer covered by insurance. Getting this out of pocket has been cost prohibitive. Pt does not that she has not been taking her other home meds consistently due to her n/v/d. Denies hematochezia, fevers/chills, worsening SOB/CP, headaches, hematuria, dysuria.     Pt was recently hospitalized 11/25-11/27 for COPD exacerbation treated w/ duonebs and pred 40 x 5 days.     In ED she was afebrile, BP 133/60, HR 90, Sat 99% on 2L. CBC w/ WBC 11.2. BMP w/ bicarb 31, Gluc 100, Cr 1.61 (bl ~1.4) and BUN 45. Phos low at 2.3. Mag 2.1. Lipase wnl. LFTs wnl. CT A/P completed w/o acute abnormalities. Received 500cc NS in ED.     Medications   sodium chloride  0.9% IV soln bolus 500 mL (0 mLs Intravenous Stopped 01/30/24 1928)   iodixanol  (Visipaque ) 320 mg/mL inj 90 mL (90 mLs Intravenous Given 01/30/24 2050)   potassium & sodium phosphates  280-160-250 mg pwd pkt 1 packet (1 packet Oral Given 01/30/24 2328)       PHYSICAL EXAM:  BP 127/61 (Patient Position: Sitting)  ~ Pulse (!) 101  ~ Temp 36.7 ?C (98.1 ?F) (Oral)  ~ Resp 23  ~ Ht 1.626 m (5' 4'')  ~ Wt 59 kg (130 lb)  ~ SpO2 99%  ~ BMI 22.31 kg/m?   General: NAD. Appears well-developed, well-nourished. A&Ox4. NC in place.   HENT: Normocephalic, atraumatic. Oropharynx is clear, mucus membranes are moist.   CV: Regular rate and rhythm, no murmurs or gallops. No dependent lower extremity edema. 2+ DP/PT pulses equal bilaterally  Respiratory: Clear to auscultation bilaterally without wheezing or rhonchi. Normal respiratory effort.  Abdomen: Soft, nontender and TTP b/l LQ.   Skin: Normal skin turgor. No rashes appreciated.    Labs and imaging reviewed by me.    ASSESSMENT/PLAN:   [name] is a 88 y.o. female with a history of myasthenia gravis, IBS, diverticulitis, breast cancer, COPD, HFrEF, chronic hypoxic respiratory failure (on 2L NC), CKD stage IV, CAD, GERD presenting with diarrhea.     #Acute on Chronic Diarrhea  #Nausea/Vomiting  #IBS  #Leukocytosis - WBC 11  Acute worsening of pt's chronic diarrhea with associated n/v. May be secondary to pt being out of her Dexilant  but pt also w/ mild leukocytosis and abdominal tenderness on exam. CT A/P w/o acute abnormalities.     #NAGMA- bicarb 31   #Elevation in Cr (not meeting AKI) - Cr 1.6 (from bl 1.4)   #Hypophosphatemia - Phos 2.3  Suspect patient hypovolemic in setting of diarrhea/vomiting and poor PO intake. S/p 500cc NS in ED. Will hold off on additional fluid  given her EF of 20%.     -Chronic/Stable-  #COPD - spiriva  daily   #Chronic hypoxic respiratory failure - 2L NC bl     #HFrEF (EF 20%) - does not appear volume up on exam   Home meds: lasix  20mg  every day, metoprolol  succinate 25mg  daily, losartan  100mg  and farxiga  10mg    -  plan for pacemaker placement 03/03/24     #Myasthenia Gravis - cont home pyridostigmine  60mg  TID and azathioprine     #B/l Breast Cancer - continue exemestane  25mg  daily     #GERD - continue pantoprazole  40mg  daily     #Major Depression   #Chronic Benzo Use   - home med: clonazepam  1.5mg  qhs, duloxetine  60mg  daily, and bupropion  150mg  BID     PLAN:   - Consider sending stool enteric pathogen panel, C. Dif, norovirus PCR   - restart home Dexilant  if available in hospital formulary   - Consider holding home lasix  and losartan  if Cr worsening on AM labs   - continuous pulse ox   - restart home meds   - Geri Consult  - Pt stable for transfer to [facility]      Signed:  Teron Blais P. Latina Frank  01/31/2024 at 12:42 AM

## 2024-01-31 NOTE — Progress Notes
 [name] is a 88 y.o. female with  pmh  myasthenia gravis, IBS, diverticulitis, breast cancer, COPD, HFrEF, acute on chronic hypoxic respiratory failure (on 2L NC), CKD stage IV, CAD, GERD who is presenting for Diarrhea as a transfer from the [facility] ED.     Pt reports she is having multiple episodes of diarrhea as well as nausea and vomiting since September 2025. Since her discharge  ~ 11/27 from [facility], she notes  diarrhea which keep her up until 4am. She is having difficulty tolerating food due to having diarrhea immediately afterwards  .Has had significant abdominal pain in her lower abdomen. Notes that diarrhea has been worsening since September when she ran out of her Dexilant  for IBS as it is no longer covered by insurance. Getting this out of pocket has been cost prohibitive. Pt is not taking her other home meds consistently due to her n/v/d. Has not been taking her depression medications, (wellbuturin, duloxetine ), Still taking her inhalers. Does not remember which other medications she is taking.      Today 12/4-12/5 had 2 Bms that were non bloody and brown. At the peak of her diarrhea she was on the toilet constantly. No lightheadedness. Occasionally diarrhea is yellow.  No travel for 3 months out of the state. No lightheadedness or syncope. A relative around her had covid recently.      Denies hematochezia, fevers/chills, worsening SOB/CP, headaches, hematuria, dysuria.      Pt was recently hospitalized 11/25-11/27 for COPD exacerbation treated w/ duonebs and pred 40 x 5 days.      In [facility] ED   she was afebrile, BP 133/60, HR 90, Sat 99% on 2L. CBC w/ WBC 11.2. BMP w/ bicarb 31, Gluc 100, Cr 1.61 (bl ~1.4) and BUN 45. Phos low at 2.3. Mag 2.1. Lipase wnl. LFTs wnl. CT A/P completed w/o acute abnormalities. Received 500cc NS in ED.     Patient states she has had diarrhea with associated nausea and vomiting for many years. In January, symptoms started to worsen. She takes a medication for her IBS, however lately she has unable to obtain medication due to insurance. Patient was also recently hospitalized for shortness of breath a few weeks ago for 2 days and discharged, now on 2 L nasal cannula at baseline at home. Patient stated her diarrhea has worsened with a few days, loose, brown, and smelly. Patient endorses emesis when trying to eat, has been unable to tolerate food or liquids for the last day. She denies fever, chest pain, shortness of breath, bloody bowel movements, bloody emesis, and abdominal surgery.
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
## 2024-01-31 NOTE — Progress Notes
 Pharmaceutical Services - Admission Medication Reconciliation Note      Patient Name: [name]  Medical Record Number: [id number]  Admit date: 01/31/2024 3:37 AM    Age: 88 y.o.  Sex: female  Allergies:   Allergies   Allergen Reactions    Beta Adrenergic Blockers Other (See Comments)     Avoid d/t Myasthenia Gravis  Avoid d/t Myasthenia Gravis      Levofloxacin Other (See Comments)     Flare of Myasthenia Gravis    Macrolides And Ketolides Other (See Comments)     Avoid d/t Myasthenia Gravis  Use with caution d/t Myasthenia Gravis  Use with caution d/t Myasthenia Gravis      Sulfa Antibiotics Other (See Comments)     May possibly have caused deafness in one ear  May possibly have caused deafness in one ear  other      Botulinum Toxins Other (See Comments)     Muscle weakness r/t Myasthenia Gravis  Muscle weakness r/t Myasthenia Gravis      Pollen Extract Wheezing    Statins Other (See Comments)     Muscle aches  Muscle aches  Muscle aches      Plastic Tape Itching and Other (See Comments)     Turns the skin red where applied     Height:   Most recent documented height   01/30/24 1.626 m (5' 4'')     Actual Weight:   Most recent documented weight   01/31/24 58.8 kg   01/30/24 59 kg     Diagnosis: The patient is currently admitted with the following concerns/issues: Active Problems:    * No active hospital problems. *      Reported Medication History   I used the facility [facility] via fax to update the home medication list for this hospital admission.    PTA Medication List (discrepancies are noted)   Medications Prior to Admission   Medication Sig Note Last Dose/Taking    ALBUTEROL  90 mcg/act inhaler INHALE 1-2 PUFF BY MOUTH EVERY 4 HOURS AS NEEDED FOR WHEEZING OR SHORTNESS OF BREATH  Taking    aspirin  81 mg EC tablet Take 1 tablet (81 mg total) by mouth daily.  Taking    azaTHIOprine  50 mg tablet Take 1.5 tablets (75 mg total) by mouth daily. (Patient taking differently: Take 1.5 tablets (75 mg total) by mouth two (2) times daily.)  Taking Differently    BUPROPION , SR, 200 mg 12 hr tablet TAKE 1 TAB BY MOUTH TWICE DAILY (ANTIDEPRESSANT)  Taking    clonazePAM  1 mg tablet TAKE 1 AND HALF TABLET BY MOUTH AT NIGHT  Taking    DEXILANT  60 MG DR capsule Take 1 capsule (60 mg total) by mouth daily.  Taking    DULOXETINE  20 mg DR capsule TAKE 3 CAPSULES (60 MG TOTAL) BY MOUTH DAILY  Taking    EPINEPHrine  0.3 mg/0.3 mL auto-injector INJECT 0.3ML (0.3MG ) SUBCUTANEOUSLY AS NEEDED FOR ANAPHYLAXIS (ACUTE AND CHRONIC RESPIRATORY FAILURE WITH HYPOXIA)  Taking    exemestane  25 mg tablet Take 1 tablet (25 mg total) by mouth daily.  Taking    FARXIGA  10 MG tablet 1 TAB BY MOUTH EVERY DAY  Taking    fluticasone -vilanterol 100-25 mcg/act inhaler INHALE 1 PUFF BY MOUTH EVERY DAY (ACUTE AND CHRONIC RESPIRATORY FAILURE WITH HYPOXIA)  Taking    furosemide  20 mg tablet Take 1 tablet (20 mg total) by mouth daily.  Taking    LOSARTAN  100 mg tablet TAKE 1 TAB  BY MOUTH EVERY DAY (HEART FAILURE, UNSPECIFIED)  Taking    metoprolol  succinate 25 mg 24 hr tablet Take 1 tablet (25 mg total) by mouth daily.  Taking    MONTELUKAST  10 mg tablet TAKE 1 TAB BY MOUTH EVERY EVENING (ACUTE AND CHRONIC RESPIRATORY FAYLURE WITH HYPOXIA)  Taking    PENICILLIN  V POTASSIUM 500 mg tablet TAKE 1 TAB BY MOUTH TWICE DAILY (BACTERIAL INFECTION)  Taking    PYRIDOSTIGMINE  60 mg tablet TAKE 1 TAB BY MOUTH THREE TIMES DAILY (MYASTHENIA GRAVIS)  Taking    spironolactone  25 mg tablet Take 1 tablet (25 mg total) by mouth daily.  Taking    sodium chloride  0.9% IV soln  (Patient not taking: Reported on 01/31/2024) 01/31/2024: Not on facility med list Not Taking    [MAR Hold] tiotropium 18 mcg/act inhalation capsule Place 1 capsule (18 mcg total) into inhaler and inhale daily. (Patient not taking: Reported on 01/31/2024) 01/31/2024: Not on facility med list Not Taking    tiotropium bromide  (SPIRIVA  RESPIMAT) 1.25 mcg/act inhaler Inhale 2 puffs daily. (Patient not taking: Reported on 01/31/2024) 01/31/2024: Not on facility med list Not Taking    tiotropium bromide  1.25 mcg/act inhaler Inhale 2 puffs daily. 01/31/2024: Not on facility med list     ULTOMIRIS 1100 MG/11ML SOLN  (Patient not taking: Reported on 01/31/2024) 01/31/2024: Not on facility med list Not Taking       Discharge Prescription Preference:   [facility]  - [place], [place] - [address]  [address]  [place] [address]        The patient's allergies and medications have been reviewed and updated.   -------------------------------------------------------------------------------------------------------------------          I have reviewed the medication list compiled by the medication reconciliation pharmacy technician and agree with the findings.      Medication Notes   Of note, per chart review, patient refuses medications intermittently at facility.    Reconciliation  The assessment and reconciliation of admission and inpatient orders with PTA medication list is complete.      Consider the following recommendations which differ from inpatient orders if clinically indicated:  Home medications ordered differently during admission: azathioprine  75 mg BID (daily currently ordered). Recommend to increase dose to match PTA regimen.   Home medications not resumed during admission:  bupropion  SR 200 mg BID, duloxetine  60 mg daily. Consider resuming as clinically appropriate.   Patient was not taking the following medications at home which were continued upon admission: tiotropium inhalation. Recommend to reassess and DC if no longer medically necessary.   Noted patient taking clonazepam  nightly PTA (not currently ordered). Recommend to consider more geri-friendly alternative like melatonin for sleep.    This note represents our good faith effort to obtain the best possible medication history from all attainable sources at the time of reconciliation    Chiquita Soho, PharmD, BCPS  Transitions of Care Pharmacist  [phone]    Medication Dispense History (from 08/04/2023 to 01/30/2024)       Albuterol  Sulfate          Dispensed Days Supply Quantity Provider Pharmacy    ALBUTEROL     AER HFA 01/08/2024 30 18 g [name], [facility]    ALBUTEROL     AER [facility] 09/17/2023 30 18 g [name], MD [facility]  Amoxicillin -Pot Clavulanate          Dispensed Days Supply Quantity Provider Pharmacy    AMOX/K CLAV  TAB [id number] 10/27/2023 7 14 tablet Koretz, Brandon K., MD [facility]                    azaTHIOprine           Dispensed Days Supply Quantity Provider Pharmacy    AZATHIOPRINE  TAB 50MG  12/25/2023 30 90 tablet Koretz, Brandon K., MD [facility]    AZATHIOPRINE  TAB 50MG  11/25/2023 30 90 tablet Koretz, Brandon K., MD [facility]    AZATHIOPRINE  TAB 50MG  11/16/2023 14 42 tablet [name], MD [facility]    AZATHIOPRINE  TAB 50MG  10/22/2023 67 100 tablet Daryel Cornet, MD [facility] [place]    AZATHIOPRINE  TAB 50MG  09/27/2023 30 90 tablet Koretz, Brandon K., MD [facility]    AZATHIOPRINE  TAB 50MG  09/11/2023 21 63 tablet Koretz, Brandon K., MD [facility]                    buPROPion  HCl          Dispensed Days Supply Quantity Provider Pharmacy    WELLBUTRIN    TAB 200MG  SR 11/05/2023 90 180 tablet [name], MD [facility] [place]    WELLBUTRIN    TAB 200MG  SR 08/22/2023 90 180 tablet Koretz, Brandon K., MD [facility] ...                    clonazePAM           Dispensed Days Supply Quantity Provider Pharmacy    CLONAZEPAM    TAB 1MG  01/20/2024 90 135 tablet Koretz, Brandon K., MD [facility] [place]    CLONAZEPAM    TAB 1MG  11/12/2023 90 135 tablet Koretz, Brandon K., MD [facility] [place]    CLONAZEPAM    TAB 1MG  09/05/2023 90 135 tablet Koretz, Brandon K., MD [facility] ...                    Dapagliflozin  Propanediol          Dispensed Days Supply Quantity Provider Pharmacy    FARXIGA       TAB 10MG  01/23/2024 30 30 tablet Koretz, Brandon K., MD [facility]    FARXIGA       TAB 10MG  12/26/2023 30 30 tablet [name], MD [facility]    FARXIGA       TAB 10MG  10/29/2023 30 30 tablet [name], MD [facility]                    Dexlansoprazole           Dispensed Days Supply Quantity Provider Pharmacy    DEXILANT      CAP 60MG  DR 09/04/2023 90 90 capsule [name], MD [facility] [place]                    DULoxetine  HCl          Dispensed Days Supply Quantity Provider Pharmacy    DULOXETINE    CAP 20MG  DR 01/13/2024 90 270 capsule [name], MD [facility] [place]    DULOXETINE    CAP 20MG  DR 11/05/2023 90 270 capsule [name], [place] [facility]  [place].                    Exemestane           Dispensed Days Supply Quantity Provider Pharmacy    EXEMESTANE    TAB 25MG  11/25/2023 30 30 tablet [name], MD [facility]    EXEMESTANE    TAB 25MG  10/24/2023 30 30 tablet [name], MD [facility]    EXEMESTANE    TAB 25MG  10/01/2023 30 30 tablet [name], MD [facility]    EXEMESTANE    TAB 25MG  09/18/2023 14 14 tablet Koretz, Brandon K., MD [facility]                    Furosemide           Dispensed Days Supply Quantity Provider Pharmacy    FUROSEMIDE    TAB 20MG  12/25/2023 30 30 tablet [name], MD [facility]    FUROSEMIDE    TAB 20MG  11/25/2023 30 30 tablet [name], MD [facility]    FUROSEMIDE    TAB 20MG  10/24/2023 30 30 tablet [name], MD [facility]    FUROSEMIDE    TAB 20MG  09/25/2023 30 30 tablet [name], MD [facility]    FUROSEMIDE    TAB 20MG  08/26/2023 30 30 tablet [name], MD [facility]                    Losartan  Potassium          Dispensed Days Supply Quantity Provider Pharmacy    LOSARTAN  POT TAB 100MG  12/25/2023 30 30 tablet Koretz, Brandon K., MD [facility]    LOSARTAN  POT TAB 100MG  11/25/2023 30 30 tablet [name], MD [facility]    LOSARTAN  POT TAB 100MG  10/27/2023 5 5 tablet Koretz, Brandon K., MD [facility]                    Metoprolol  Succinate          Dispensed Days Supply Quantity Provider Pharmacy    METOPROL SUC TAB 25MG  ER 11/13/2023 30 30 tablet Tabibiazar, Ramin, MD [facility] [place]    METOPROL SUC TAB 25MG  ER 10/17/2023 30 30 tablet Tabibiazar, Ramin, MD [facility] [place]                    Penicillin  V Potassium          Dispensed Days Supply Quantity Provider Pharmacy    PENICILLN VK TAB 500MG  12/25/2023 30 60 tablet Koretz, Brandon K., MD [facility]    PENICILLN VK TAB 500MG  11/26/2023 30 60 tablet Koretz, Brandon K., MD [place] [facility]                    predniSONE           Dispensed Days Supply Quantity Provider Pharmacy    PREDNISONE    TAB 20MG  01/24/2024 3 6 tablet Swier, Vernell HERO., MD [facility]                    pyRIDostigmine  Bromide          Dispensed Days Supply Quantity Provider Pharmacy    PYRIDOSTIGM  TAB 60MG  01/01/2024 29 87 tablet Danetta Penne  K., MD [facility]    PYRIDOSTIGM  TAB 60MG  12/28/2023 3 9 tablet Koretz, Brandon K., MD [facility]    PYRIDOSTIGM  TAB 60MG  11/25/2023 30 90 tablet Koretz, Brandon K., MD [facility]    PYRIDOSTIGM  TAB 60MG  10/24/2023 30 90 tablet Koretz, Brandon K., MD [facility]    PYRIDOSTIGM  TAB 60MG  09/16/2023 30 90 tablet Koretz, Brandon K., MD [facility]                    Ravulizumab-cwvz          Dispensed Days Supply Quantity Provider Pharmacy    ULTOMIRIS    INJ 100MG /ML 12/10/2023 56 33 mL Daryel Cornet, MD [facility] [place]    ULTOMIRIS    INJ 100MG /ML 10/16/2023 56 33 mL Daryel Cornet, MD [facility] [place]    ULTOMIRIS    INJ 100MG /ML 08/20/2023 56 33 mL Daryel Cornet, MD [facility] [place]                    Sodium Chloride           Dispensed Days Supply Quantity Provider Pharmacy    SOD CHLORIDE INJ 0.9% 12/10/2023 56 100 mL Daryel Cornet, MD [facility] [place]    SOD CHLORIDE INJ 0.9% 08/20/2023 56 100 mL Karasozen, Yigit, MD [facility] [place]                          Disclaimer    Certain information may not be available or accurate in this report, including over-the-counter medications, low cost prescriptions, prescriptions paid for by the patient or non-participating sources, or errors in insurance c laims information. The provider should independently verify medication history with the patient.       External Sources       Source Last Checked for Updates Status    Surescripts (Ambulatory) 01/30/2024 10:09 AM History Response Filed    Surescripts (Acute Care) 01/30/2024  2:50 PM History Response Fredricka Chiquita Soho, PharmD, 01/31/2024, 12:32 PM

## 2024-01-31 NOTE — End of Shift Summary
 Patient's Clinical Goal:   Clinical Goal(s) for the Shift: pain control, comfort, safety, NPO for possible IR procedure    Stability of the patient: Moderately Stable - low risk of patient condition declining or worsening     Neuro: Within Defined Limits   BMAT: Level 4 - [name]  Assistive Device:      Vitals:    Temp:  [36.6 ?C (97.8 ?F)-36.8 ?C (98.3 ?F)] 36.6 ?C (97.8 ?F)  Heart Rate:  [72-101] 74  Resp:  [15-23] 18  BP: (91-138)/(48-90) 118/56  NBP Mean:  [66-97] 75  SpO2:  [95 %-100 %] 100 %  Cardiac:  Normal sinus rhythm;ST Depression  Ectopy:None  Respiratory:  Nasal cannula  2 L/min  Pain:    Last BM: 01/30/24  GU: Voiding  Skin: Within Defined Limits  Lines/Tubes/Drains:      Shift Events/Acute Changes:     SW consult completed for food insecurities.  All respiratory labs negative; isolation for enhanced contact d/c'ed  Patient has home meds that were found in closet this evening at 1850; patient wants to take her home anti-depressants.      To Do/Pending:  none     Nursing Considerations for Discharge:  D/c to SNF from MD note today.     Expected Discharge Disposition:       Expected Discharge Date: 02/03/2024

## 2024-01-31 NOTE — Consults
 CLINICAL SOCIAL WORKER PROGRESS NOTE    Admit Date: 01/31/2024    Date of CSW Assessment: 01/31/2024    LANGUAGE ASSISTANCE   Language Resource Used?: Patient declined    REFERRAL INFORMATION   Date Seen: 01/31/24  Date Referred: 01/31/24  Time In: 1030  Time Out: 1115  Referral Source: RN  Reason for Referral: Other (comment) (Food Insecurity)  Source of Information: Consultation, Review of medical record, Persons interviewed  Consult Source: RN  Interview Source(s): Patient -- Kaitlyn, [name].  Does the patient have a Family/Support System member participating in Discharge Planning?: Yes  Family/Support System in agreement with the current discharge plan: Yes, in agreement and participating    CLINICAL ASSESSMENT   SW consult ordered by RN 01/31/2024 for food insecurity.    Writer conducted review of pt's chart. Per pt's chart, pt ''is a 88 y.o. female with  pmh  myasthenia gravis, IBS, diverticulitis, breast cancer, COPD, HFrEF, acute on chronic hypoxic respiratory failure (on 2L NC), CKD stage IV, CAD, GERD who is presenting for Diarrhea as a transfer from the [facility] ED.''     SW met with pt at bedside; pt received sitting up in bed awake, presented as A&Ox4 and able to make needs known.    Writer introduced self, SW role, and purpose of visit -- e.g., to assess for resources and supports and assist with d/c planning. Pt agreeable to speaking with this clinical research associate.    Pt shares she lives at [facility] -- [address] [place], [place] [address]; phone: [phone]. Pt reports she has lived at ALF for past ~3 months -- no concerns/safety concerns/issues identified by pt on assessment. Pt shares she is comfortable to return to ALF upon hospital d/c.    Pt confirms her phone number listed on facesheet is accurate [phone]). Pt shares her daughter (Kaitlyn  [name]; [phone]) lives in [place] in [place] with husband and pt's grandchildren -- states daughter is very supportive and involved.    Pt discussed experiencing significant diarrhea and nausea which make it difficult for her to eat. Pt states she is only able to tolerate Brand Name Dexilant ; not Generic -- states her Primary MD has been supportive in figuring out issue/working with insurance for medication changes. Due to symptoms, pt shares she has trouble cooking independently at this time. Pt reports her ALF has a dining room and provides food -- denies food insecurity and denies need for additional food programs/services at this time.    Pt denies additional SDOH needs/concerns at this time.    Pt denies further SW needs at this time. SW to remain available.     Interventions: Discussed role of CSW and informed CSW availability for support and resources, Gathered history, Provided empathetic listening and emotional support, Processed and explored feelings related to diagnosis/treatment, Assessed for barriers to discharge, Discussed resources available, Coordinated care with multidisciplinary team, Explored and discussed family dynamics impacting care    Plan/Recommendations: Discharge to Home    Interdisciplinary rounds were conducted with the multidisciplinary team including the clinical social worker and nurse case production designer, theatre/television/film. The patient's plan of care and discharge planning were discussed and formulated based on the patient's specific needs.    Projected Date of Discharge: 02/03/2024    SDOH INTERVENTIONS   Identified Positive Screens: Food Needs  Food Needs Interventions: Patient Declined  Food Needs Intervention Details (counseling, resources, etc.): Pt reports ALF has a dining room/provides food -- denies food insecurity; denies need for  additional food services/programs resources at this time    SVI INTERVENTIONS   Provided SVI Resource Bundle?: No     [name], MSW, 01/31/2024  Pager: [phone]

## 2024-01-31 NOTE — Nursing Clinical Note
 Report received from Chad RN prior to transfer.  Patient received from 2OB at time of 1933, accompanied by gurney.    On arrival, patient oriented x4, able to verbalize needs.  Vital signs noted Blood pressure 118/56, pulse (!) 104, temperature 36.4 ?C (97.6 ?F), temperature source Oral, resp. rate 20, weight 58.7 kg (129 lb 6.4 oz), SpO2 97%.  Patient is on cardiac monitor.      [x]  Monitor room notified (if applicable).  Patient is on continuous pulse oximetry.      [x]  Monitor room notified (if applicable).  Heart rhythm is normal sinus rhythm with PVCs for ectopy.  Patient is on 2 liters/min via nasal cannula  Skin assessment done with 2nd RN Almarie, noted skin intact except some bruising on ABD d/t last hospitalization Heparin  injection  Patient is not on isolation.      Patient has no home medications brought into hospital.  The patient was oriented/educated to call light, bed, and surroundings.  Call light in reach.  Fall prevention education provided.

## 2024-02-01 LAB — Glucose,POC
GLUCOSE,POC: 104 mg/dL — ABNORMAL HIGH (ref 65–99)
GLUCOSE,POC: 93 mg/dL (ref 65–99)
GLUCOSE,POC: 136 mg/dL — ABNORMAL HIGH (ref 65–99)

## 2024-02-01 LAB — Basic Metabolic Panel
CREATININE: 1.31 mg/dL — ABNORMAL HIGH (ref 0.60–1.30)
CREATININE: 1.31 mg/dL — ABNORMAL HIGH (ref 0.60–1.30)

## 2024-02-01 LAB — Differential Automated: MONOCYTE PERCENT, AUTO: 8.5 % (ref 0.00–0.04)

## 2024-02-01 LAB — Hepatic Funct Panel: ALBUMIN FORHEPFUNCTPNL: 3.4 g/dL — ABNORMAL LOW (ref 3.9–5.0)

## 2024-02-01 LAB — CBC: ABSOLUTE NUCLEATED RBC COUNT: 0 x10E3/uL (ref 0.00–0.00)

## 2024-02-01 LAB — Phosphorus: PHOSPHORUS: 3.3 mg/dL (ref 2.5–4.5)

## 2024-02-01 MED ADMIN — AZATHIOPRINE 50 MG PO TABS: 75 mg | ORAL | @ 19:00:00 | Stop: 2024-02-03 | NDC 68084022901

## 2024-02-01 MED ADMIN — POLYETHYLENE GLYCOL 3350 17 G PO PACK: 17 g | ORAL | @ 04:00:00 | Stop: 2024-02-03

## 2024-02-01 MED ADMIN — POLYETHYLENE GLYCOL 3350 17 G PO PACK: 17 g | ORAL | @ 17:00:00 | Stop: 2024-02-03

## 2024-02-01 MED ADMIN — POLYETHYLENE GLYCOL 3350 17 G PO PACK: 17 g | ORAL | @ 22:00:00 | Stop: 2024-02-01 | NDC 60687043199

## 2024-02-01 MED ADMIN — IPRATROPIUM-ALBUTEROL 0.5-2.5 (3) MG/3ML IN SOLN: 3 mL | RESPIRATORY_TRACT | @ 18:00:00 | Stop: 2024-02-03 | NDC 60687040579

## 2024-02-01 MED ADMIN — SPIRONOLACTONE 12.5 MG PO TABS: 25 mg | ORAL | @ 17:00:00 | Stop: 2024-02-03

## 2024-02-01 MED ADMIN — MONTELUKAST SODIUM 10 MG PO TABS: 10 mg | ORAL | @ 04:00:00 | Stop: 2024-02-03 | NDC 00904680861

## 2024-02-01 MED ADMIN — TIOTROPIUM BROMIDE 2.5 MCG/ACT IN AERS: 2 | RESPIRATORY_TRACT | @ 18:00:00 | Stop: 2024-02-03

## 2024-02-01 MED ADMIN — ASPIRIN 81 MG PO TBEC: 81 mg | ORAL | @ 17:00:00 | Stop: 2024-02-03 | NDC 77333003125

## 2024-02-01 MED ADMIN — BUPROPION HCL ER (SR) 100 MG PO TB12: 200 mg | ORAL | @ 17:00:00 | Stop: 2024-02-03 | NDC 68084069711

## 2024-02-01 MED ADMIN — BUPROPION HCL ER (SR) 100 MG PO TB12: 200 mg | ORAL | @ 04:00:00 | Stop: 2024-02-03 | NDC 68084069711

## 2024-02-01 MED ADMIN — HEPARIN SODIUM (PORCINE) 5000 UNIT/ML IJ SOLN: 5000 [IU] | SUBCUTANEOUS | @ 17:00:00 | Stop: 2024-02-03 | NDC 00409272330

## 2024-02-01 MED ADMIN — HEPARIN SODIUM (PORCINE) 5000 UNIT/ML IJ SOLN: 5000 [IU] | SUBCUTANEOUS | @ 04:00:00 | Stop: 2024-02-03 | NDC 00409272330

## 2024-02-01 MED ADMIN — SENNOSIDES 8.6 MG PO TABS: 1 | ORAL | @ 22:00:00 | Stop: 2024-02-01 | NDC 00904725261

## 2024-02-01 MED ADMIN — PYRIDOSTIGMINE BROMIDE 60 MG PO TABS: 60 mg | ORAL | @ 07:00:00 | Stop: 2024-02-03 | NDC 68084049411

## 2024-02-01 MED ADMIN — PYRIDOSTIGMINE BROMIDE 60 MG PO TABS: 60 mg | ORAL | @ 17:00:00 | Stop: 2024-02-03 | NDC 68084049411

## 2024-02-01 MED ADMIN — LOSARTAN POTASSIUM 50 MG PO TABS: 100 mg | ORAL | @ 19:00:00 | Stop: 2024-02-03 | NDC 50268050511

## 2024-02-01 MED ADMIN — LOSARTAN POTASSIUM 50 MG PO TABS: 100 mg | ORAL | @ 04:00:00 | Stop: 2024-02-03 | NDC 50268050511

## 2024-02-01 MED ADMIN — SENNOSIDES 8.6 MG PO TABS: 1 | ORAL | @ 04:00:00 | Stop: 2024-02-03

## 2024-02-01 MED ADMIN — METOPROLOL SUCCINATE ER 25 MG PO TB24: 25 mg | ORAL | @ 17:00:00 | Stop: 2024-02-03 | NDC 60687039011

## 2024-02-01 MED ADMIN — FLUTICASONE-SALMETEROL 100-50 MCG/ACT IN AEPB: 1 | RESPIRATORY_TRACT | @ 18:00:00 | Stop: 2024-02-03 | NDC 00054032656

## 2024-02-01 MED ADMIN — FLUTICASONE-SALMETEROL 100-50 MCG/ACT IN AEPB: 1 | RESPIRATORY_TRACT | @ 07:00:00 | Stop: 2024-02-03

## 2024-02-01 MED ADMIN — DULOXETINE HCL 60 MG PO CPEP: 60 mg | ORAL | @ 17:00:00 | Stop: 2024-02-03 | NDC 68001059604

## 2024-02-01 MED ADMIN — FUROSEMIDE 20 MG PO TABS: 20 mg | ORAL | @ 17:00:00 | Stop: 2024-02-03 | NDC 00904717761

## 2024-02-01 MED ADMIN — AZATHIOPRINE 50 MG PO TABS: 75 mg | ORAL | @ 04:00:00 | Stop: 2024-02-01

## 2024-02-01 NOTE — Nursing Clinical Note
 0745: Patient received in bed, on 1L O2 via NC, AxOx 4, in no apparent distress, fall precautions maintained, call light within reach.

## 2024-02-01 NOTE — End of Shift Summary
 Patient's Clinical Goal:   Clinical Goal(s) for the Shift: VSS, rest, comfort  Identify possible barriers to advancing the care plan:   Stability of the patient: Moderately Unstable - medium risk of patient condition declining or worsening    Progression of Patient's Clinical Goal:   Pt AO x 4, was on 2L NC satting 98-100%, able to wean down to 1L NC at 2015, O2 sat remains 95- 96% throughout night with no episode of desat   Pt refused to be off NC this morning when writer wants to wean to RA, get anxious and upset when she knows she was on 1L NC, put pt back to NC, remains on 1L NC  NSR with BBB & PVC, ST depressed on tele  No pain or discomfort, no s/s of SOB or any distress noted in shift.  Voiding well, no BM.  Call light within reach, bed in lowest position and bed alarm on. All needs attended to.  BP 100/60  ~ Pulse 73  ~ Temp 36.4 ?C (97.6 ?F) (Oral)  ~ Resp 18  ~ Wt 58.7 kg (129 lb 6.4 oz)  ~ SpO2 96%  ~ BMI 22.21 kg/m?      Pending:  Back to [facility] when stable      GERIATRICS END OF SHIFT - DISCHARGE MARKERS of Instability Checklist  Nursing assessment:     Indicator   Cutoff Check if indicator needs to be addressed (meets cutoff)   Situation/Action/Comment   O2 requirement New since admission []     PO intake Less than 50% []     Urination None in past 8 hours/last shift []     Foley New since admission []     Pain Present  []     Bowel movements  <1 in 2 days or >3 in 24 hours []     Delirium Unable to follow simple commands or participate with PT/OT []     Mobility Unable to stand and/or walk to bathroom []  OOB to chair 0  times.  Ambulated 10 feet.       BOOST / SAFE TRANSITION - DISCHARGE INDICATORS    Indicator Check if indicator has red ''P''    Situation/Action/Comment     Problems with Medications  []     Psychological  []     Principal Diagnosis  []     Physical Limitations  []     Poor Health Literacy  []     Patient Support  [x]     Prior Hospitalizations  []     Palliative Care  []       DELIRIUM PREVENTION  Protocols Strategies Check if implemented Comments   Risk factors >3 present- High risk []     Purposeful orientation Reorient, purposeful orienting conversation  Familiar objects in room [x]   []     Therapeutic activities Volunteer visit  Cognitive stimulation activities: games, reading, music []   []     Vision & hearing Assistance with: leisure centre manager  Assistance with: hearing aids/hearing amplifier []   []     Feeding & hydration Assistance with feeding  Assistance with dentures []   []     Sleep hygiene Shades/blinds/lights on during day, limit naps during day  Quiet environment, consolidate care [x]     Mobilization BMAT 3-4: Ambulate TID  BMAT 2: OOB daily to chair for meals <= 2 hours, each time  BMAT 1: OOB to cardiac chair daily for meals <= 2 hours, each time []   []   []     Pain Non-narcotics ATC  Non-pharmacological: oil/aromatherapy, massage, music []   []   Maintain safety Fall precautions, volunteer visit, family at bedside, tele sitter, constant observer [x]     Manage agitation Redirect with calm, gentle voice and avoid confrontation  Avoid restraints and use alternative to restraints  Doll, music or animal therapy, as appropriate  Volunteer for companionship if safe and appropriate [x]   []   []   []       DELIRIUM: CAM (+)  Protocols Strategies Check if implemented Comments   New-onset MD contacted  Delirium order-set initiated  Bladder scan to R/O retention  Assess stool impaction  Medication reviewed with pharmacist []   []   []   []   []  MD Name:   Existing Manage and prevent further delirium [] 

## 2024-02-01 NOTE — End of Shift Summary
 Patient's Clinical Goal:   Clinical Goal(s) for the Shift: vss; safety; comfort; monitor diarrhea  Identify possible barriers to advancing the care plan:   Stability of the patient: Moderately Unstable - medium risk of patient condition declining or worsening    Progression of Patient's Clinical Goal:     - AxOx4  - On 2L O2 via NC satting >98%  - NSR w/ ST depression on tele monitor  - BM x1 during shift, ambulates to the toilet to void  - Soft BP's, patient asymptomatic, MD aware  - Ambulated 392ft in hallway  - Tap water enema given  - No c/o pain during shift  - All medications given as ordered  - Fall precautions maintained, call light within reach  - Dispo plan to Home when medically stable    BP 104/45  ~ Pulse 64  ~ Temp 36.4 ?C (97.5 ?F) (Temporal)  ~ Resp 19  ~ Wt 58.7 kg (129 lb 6.4 oz)  ~ SpO2 97%  ~ BMI 22.21 kg/m?

## 2024-02-01 NOTE — Progress Notes
 GERIATRICS PROGRESS NOTE   Patient: [name]  MRN: [id number]  Date of Service: 02/01/2024  Admission Date: 01/31/2024    Hospital Day: 1  PCP: [name], MD  Attending Physician: Audelia Sheril RAMAN., MD  Primary Resident: Vernell HERO. Swier, MD    CHIEF COMPLAINT   admitted overnight for overflow diarrhea. Declined Bowel reg overnight, willing for bowerl reg today.   No other acute concerns      SUBJECTIVE/INTERVAL EVENTS       Geriatric-Specific Review of Systems:  Falls: none  Nutrition: no concerns  Weight Loss: none  Swallowing: no concerns  Dentures: no concerns  Baseline Cognition: stable  Incontinence: denies   Constipation: denies  Depression/Anxiety:   Hearing: no concerns  Vision: no concerns     Social History:  With whom does the patient live:                                      [x]  alone                                                            []  adult child:        []  spouse or partner                                        []  other, specify:      Which best describes the patient's residence:                                []  single family house                                     []  condo/apartment                            [x]  board and care (B&C) or assisted living facility(ALF)  []  memory unit at ALF                 []  nursing home                 []  independent senior housing  []  need to navigate stairs?                   Caregiver:                  [x]  employed caregiver; if yes, indicate days/hours available: 2x/week                 []  family caregiver; if yes, indicate days/hours available:                 []  none     Occupation/past occupation:      Functional Status (prior to acute illness):   Source/s of Information: [x] Patient  [] Other:              Relationship:  MOBILITY    Walking across the room [] unaided    [] cane              [x] walker [] handheld     [] wheelchair [] motorized scooter [] unable    Climbing a flight of stairs [] independent  [] needs help [] unable [] n/a   ACTIVITIES OF DAILY LIVING Independent Needs help Who Helps     Feeding oneself [x]   []         Transferring  (getting from bed to chair) [x]   []         Getting to the toilet [x]   []         Toileting  [x]   []         Getting dressed [x]   []         Bathing or showering [x]   []       INSTRUMENTAL ACTIVITIES OF DAILY LIVING           Using the telephone [x]   []         Taking medications []   [x]   ALF     Preparing meals [x]   []         Managing money (paying bills, taxes, banking) []   [x]   daughter     Moderately strenuous housework  []   [x]   caregiver     Doing laundry []   [x]   Market Researcher for personal items or groceries []   []         Driving []   [x]         Getting to places beyond walking distance    (taking a bus, cab, or car) []   [x]          MEDICATIONS     Allergies:  Beta Adrenergic Blockers  Levofloxacin  Macrolides And Ketolides  Sulfa Antibiotics  Botulinum Toxins  Pollen Extract  Statins  Plastic Tape    Home Medications:  Medications that the patient states to be currently taking   Medication Sig    ALBUTEROL  90 mcg/act inhaler INHALE 1-2 PUFF BY MOUTH EVERY 4 HOURS AS NEEDED FOR WHEEZING OR SHORTNESS OF BREATH    aspirin  81 mg EC tablet Take 1 tablet (81 mg total) by mouth daily.    azaTHIOprine  50 mg tablet Take 1.5 tablets (75 mg total) by mouth daily. (Patient taking differently: Take 1.5 tablets (75 mg total) by mouth two (2) times daily.)    BUPROPION , SR, 200 mg 12 hr tablet TAKE 1 TAB BY MOUTH TWICE DAILY (ANTIDEPRESSANT)    clonazePAM  1 mg tablet TAKE 1 AND HALF TABLET BY MOUTH AT NIGHT    DEXILANT  60 MG DR capsule Take 1 capsule (60 mg total) by mouth daily.    DULOXETINE  20 mg DR capsule TAKE 3 CAPSULES (60 MG TOTAL) BY MOUTH DAILY    EPINEPHrine  0.3 mg/0.3 mL auto-injector INJECT 0.3ML (0.3MG ) SUBCUTANEOUSLY AS NEEDED FOR ANAPHYLAXIS (ACUTE AND CHRONIC RESPIRATORY FAILURE WITH HYPOXIA)    exemestane  25 mg tablet Take 1 tablet (25 mg total) by mouth daily.    FARXIGA  10 MG tablet 1 TAB BY MOUTH EVERY DAY    fluticasone -vilanterol 100-25 mcg/act inhaler INHALE 1 PUFF BY MOUTH EVERY DAY (ACUTE AND CHRONIC RESPIRATORY FAILURE WITH HYPOXIA)    furosemide  20 mg tablet Take 1 tablet (20 mg total) by mouth daily.    LOSARTAN  100 mg tablet TAKE 1 TAB BY MOUTH EVERY DAY (HEART FAILURE, UNSPECIFIED)    metoprolol  succinate 25 mg 24 hr tablet Take 1 tablet (25 mg total) by mouth daily.  MONTELUKAST  10 mg tablet TAKE 1 TAB BY MOUTH EVERY EVENING (ACUTE AND CHRONIC RESPIRATORY FAYLURE WITH HYPOXIA)    PENICILLIN  V POTASSIUM 500 mg tablet TAKE 1 TAB BY MOUTH TWICE DAILY (BACTERIAL INFECTION)    PYRIDOSTIGMINE  60 mg tablet TAKE 1 TAB BY MOUTH THREE TIMES DAILY (MYASTHENIA GRAVIS)    spironolactone  25 mg tablet Take 1 tablet (25 mg total) by mouth daily.       Current Medications:  Scheduled:  aspirin , 81 mg, Oral, Daily  azaTHIOprine , 75 mg, Oral, BID  buPROPion  (SR), 200 mg, Oral, Daily  DULoxetine , 60 mg, Oral, Daily  fluticasone -salmeterol, 1 puff, Inhalation, BID  furosemide , 20 mg, Oral, Daily  heparin , 5,000 Units, Subcutaneous, BID  ipratropium-albuterol , 3 mL, Nebulization, QID  losartan , 100 mg, Oral, Daily  metoprolol  succinate, 25 mg, Oral, Daily  montelukast , 10 mg, Oral, QPM  pneumococcal 20-valent vaccine, 0.5 mL, Intramuscular, Once  polyethylene glycol, 17 g, Oral, BID  pyRIDostigmine , 60 mg, Oral, Q8H  senna, 1 tablet, Oral, QHS  spironolactone , 25 mg, Oral, Daily  tiotropium bromide , 2 puff, Inhalation, Daily        PRN:  acetaminophen  **OR** acetaminophen  suppository, albuterol , bisacodyl, ondansetron  **OR** ondansetron  injection/IVPB  Medications 01/31/24 02/01/24      acetaminophen  tab 650 mg  Dose: 650 mg  Freq: Every 6 hours PRN Route: PO  PRN Comment: temp >38.5  Start: 01/31/24 0340 End: 03/01/24 0339          Or   acetaminophen  650 mg supp 650 mg  Dose: 650 mg  Freq: Every 6 hours PRN Route: RE  PRN Comment: temp >38.5  Start: 01/31/24 0340 End: 03/01/24 0339          albuterol  90 mcg/act inh 2 puff  Dose: 2 puff  Freq: Every 4 hours PRN Route: IN  PRN Reasons: Wheezing,Shortness of Breath  Start: 01/31/24 0530 End: 02/07/24 0529          bisacodyl EC tab 5 mg  Dose: 5 mg  Freq: Daily PRN Route: PO  PRN Reason: Constipation  Start: 01/31/24 0340 End: 03/01/24 0339           ondansetron  tab 4 mg  Dose: 4 mg  Freq: Every 6 hours PRN Route: PO  PRN Reasons: Nausea,Vomiting  Start: 01/31/24 1230 End: 03/01/24 1229          Or   ondansetron  4 mg/2 mL inj 2 mg  Dose: 2 mg  Freq: Every 6 hours PRN Route: IV  PRN Reasons: Nausea,Vomiting  Start: 01/31/24 1230 End: 03/01/24 1229                     Infusions:         OBJECTIVE     Temp:  [36.4 ?C (97.6 ?F)-36.6 ?C (97.8 ?F)] 36.4 ?C (97.6 ?F)  Heart Rate:  [73-104] 73  Resp:  [16-20] 18  BP: (100-137)/(50-90) 100/60  NBP Mean:  [67-97] 73  SpO2:  [96 %-100 %] 96 %       IN'S AND OUT'S:  UOP:     I/Os:   Intake/Output Summary (Last 24 hours) at 02/01/2024 0801  Last data filed at 01/31/2024 2313  Gross per 24 hour   Intake 360 ml   Output --   Net 360 ml                      System Check if within normal limits Positive or additional negative findings  Constit  [x]  General appearance  NAD. Euvolemic on examination   Eyes  []  Conj/Lids  []  Pupils   []  Fundi     HENMT  []  External ears/nose    [x]  Gross Hearing   []  Lips/teeth/gums    []  Mucus membranes  []  Otoscopy     []  Nasal mucosa   []  Oropharynx    []  Head        Neck  []  Inspection/palpation   []  Thyroid     Resp  [x]  Effort    [x]  Auscultation    []  Wheezing   []  Crackles     Mild wheezing appreciated  Mildly dsypneic with talkign   CV  [x]  Rhythm/rate   []  Murmurs   []  LE edema   []  JVP non-elevated No LE edema noted    Normal pulses:   []  Radial             []  Femoral        []  Pedal    Breast  []  Inspection []  Palpation     GI  [x]  Bowel sounds    [x]  Soft   []  Nontender   [x]  Nondistended   [x]  Rebound/Guarding   []  Liver/spleen   []  Rectal  Mild tenderness noted to LLQ   GU  M:    []  Scrotum []  Penis    []  Prostate  []  Foley catheter in place   []  Suprapubic tube in place     F:     []  External   []  Cervix     []  Uterus        []  Vaginal wall  []  Mucus  []  Adnexa    Lymph  []  Neck  []  Axillae []  Groin     MSK Specify site examined:         [x]  Inspect/palp   []  Stability   []  Gait        []  ROM   []  Strength/tone   []  Back    Skin  []  Inspection   []  Palpation     Neuro  []  CN2-12 intact grossly   [x]  Alert and oriented   []  DTR      []  Muscle strength      []  Sensation   []  Balance     Psych  [x]  Insight/judgement     [x]  Mood/affect    []  Gross cognition           CAM DELIRIUM TEST:    Feature One:   A. Acute Change in mental status from baseline? []  yes [x]  no   B. Is there fluctuation in the mental status? []  yes [x]  no    Feature Two:  A. Does that patient have difficulty focusing attention?  []  yes [x]  no    Feature Three:  A.Is there evidence of disorganized thinking?  []  yes [x]  no    Feature Four:  A. Does the patient have an altered level of consciousness (ie: vigilant, lethargic, stuporous, comatous?  []  yes [x]  no    The diagnosis of delirium by CAM requires the presence of features 1 and 2 and either 3 or 4.     LABS: Labs reviewed today  CBC  Recent Labs     02/01/24  0700 01/31/24  0645 01/30/24  1857   WBC 7.14 9.48 11.20*   HGB 11.5* 11.3* 13.2   HCT 35.8 35.1 40.5   MCV 98.6 98.9* 97.4   PLT 219 226 282  BMP  Recent Labs     02/01/24  0700 01/31/24  0645 01/30/24  1857   NA 140 139 140   K 4.4 4.3 4.1   CL 103 106 100   CO2 28 26 31*   BUN 33* 41* 45*   CREAT 1.31* 1.38* 1.61*   CALCIUM  9.0 8.8 9.3   MG  --  2.0* 2.1*   PHOS 3.3 2.7 2.3*     LFT  Recent Labs     02/01/24  0700 01/31/24  0645 01/30/24  1857   TOTPRO 5.9* 5.8* 6.9   ALBUMIN 3.4* 3.3* 4.0   BILITOT 0.4 0.5 0.6   BILICON <0.2 <0.2 0.2   ALT 8 8 10    AST 19 21 19    ALKPHOS 52 51 64   LIPASE  --   --  44     Coags  No results for input(s): ''INR'', ''PT'', ''APTT'' in the last 72 hours.  UA:  No results for input(s): ''SPECGRAVUR'', ''PHUR'', ''BLDUR'', ''KETONESUR'', ''GLUCOSEUR'', ''PROTCLUR'', ''NITRITEUR'', ''LEUKESTUR'', ''RBCSUR'', ''WBCSUR'' in the last 72 hours.        IMAGING: Imaging reviewed today    No imaging has been resulted in the last 24 hours    MICROBIOLOGY:    No results found for this or any previous visit (from the past 24 hours).    INDEPENDENT INTERPRETATION OF TESTS:        ASSESSMENT AND PLAN     [name] is a 88 y.o. female with a history of myasthenia gravis, IBS, diverticulitis, breast cancer, COPD, HFrEF, chronic hypoxic respiratory failure (on 2L NC), CKD stage IV, CAD, GERD presenting with diarrhea.      Acute Problems:     #Acute on Chronic Diarrhea  #Significant stool burden on imaging suspicious for Overflow Diarrhea   #Nausea   #h/o irritable bowel disease   Acute worsening of pt's chronic diarrhea with associated n and vomiting at times, found to have large stool burden, constellation of sx likely 2/2 constipation. No s/s infection. Medications may also be playing a role,on pyridostigmine  60mg  TID which can cause increased peristalsis which may be contributing to GI upset. Reports vomiting is more sputum like. No cough.   - miralax  BID, senna BID, bisacodyl prn, enema prn as tolerated  - zofran  prn (chosen after review of side effect profiles of alternative antinausea medications)  - increase mobility as much as possible   - if additional agents needed, consider lactulose, golytely  less ideal given h/o HFrEF    #CKD, stable     #Hypophosphatemia -resolved without treatment, likely erroneous     #Lytic lesion, L acetabulum    Incidental finding on CT imaging 10/2023 and again 01/2024, possibly just bone cyst but may be concerning for new met lesion given h/o breast cancer.   - Discussed with patient, explained that further imaging may be needed tbd by oncology   - Outpatient oncologist made aware (Dr. Vevelyn Medal; sooner clinic appt has been requested)      #pancreatic cyst, incidental   Reviewed imaging, unlikely to be contributing to current presentation. Asymptomatic   - The [employer] of Gastroenterology Guidelines recommend contrast enhanced MRCP in one year to ensure stability if within GOC, follow up with PCP      #69mm lucent lesion of the left acetabulum   Seen again, may be subchondral cyst less though must consider   - Consider MRI for better characterization outpatient, will notify outpatient oncologist     -  Chronic/Stable-  #COPD               -continue spiriva , albuterol  q4h prn, and fluticasone - vilanterol 100-25   - will try duonebs per patient request     #Chronic hypoxic respiratory failure - 2L NC baseline      #HFrEF (EF 20%) - does not appear volume up on exam   Home meds: lasix  20mg  every day, metoprolol  succinate 25mg  daily, losartan  100mg  and farxiga  10mg    -  plan for pacemaker placement 03/03/24               -Held HFrEF meds overnight, likely can restart since HDUS     #Myasthenia Gravis - cont home pyridostigmine  60mg  TID and azathioprine . Avoid magesium, caution with betablockers, caution with azithromycin,              -NIFS daily     #B/l Breast Cancer -   -held exemestane  25mg  daily - non formulary      #GERD   -held pantoprazole  40mg  daily given QTC      #Major Depression   #Chronic Benzo Use   - home med: clonazepam  1.5mg  at bedtime (not taking), duloxetine  60mg  (not taking) daily, and bupropion  150mg  BID (not taking)              -held all                  CODE STATUS: DNR (No CPR, defibrillation, intubation)    Current Ambulatory Status & Fall Risk:  []  non-ambulatory  [x]  walks with cane and/or walker  []  walks independently  []  walks independently but may benefit from an assistive device evaluation with physical therapy  []  needs supervision with walking secondary to poor safety awareness    SDoH  The diagnosis or treatment of said conditions is significantly limited by the following social determinants of health:  []  Z59.0 Homelessness  []  Z59.1 Inadequate housing  []  Z60.2 Problems related to living alone  []  Z59.7 Insufficient social insurance and welfare support  []  Z59.9 Problems related to housing and economic circumstances, unspecified  []  Z62.820 Parent-biological child conflict  []  Z63.8 Other specified problems related to primary support group  []  Z55.0 Illiteracy and low level literacy     Discharge Planning Considerations:  [x]  Physical Therapy (PT)/Occupational Therapy (OT) consult inpatient to evaluate for Durable Medical Equipment (DME) and other adaptive home equipment recommendations, or to assess if appropriate for Skilled Nursing Facility (SNF) or Acute Rehab Unit (ARU) for short-term rehabilitation  []  Social Work consult to evaluate home support, caregiver resources, social barriers to discharge, or if appropriate, for custodial/long-term nursing home (NH) placement or new Assisted Living Facility (ALF) or Board and Care (B&C) placement, or non-medical transport upon discharge  []  Case Management consult for Home Health needs, home IV antibiotics, above Durable Medical Equipment (DMEs), Skilled Nursing Facility (SNF) placement for short-term, skilled services, Acute Rehab Unit (ARU) placement, or ambulance transport upon discharge    Anticipated Discharge Site:  []  Return home independently  []  Return home with personal care assistance by family or employed caregiver  [x]  Return to Assisted Living Facility (ALF) or Board & Care (B&C)  []  Return to Nursing Home (NH) for custodial/long-term care  []  Skilled Nursing Facility (SNF) for short-term, post-acute skilled services (rehab, IV therapy, complex wound care, or new G-tube management)  []  Acute Rehab Unit (ARU)  []  To be determined, pending clinical course     Anticipated Discharge Needs:  [  x] Home Health [RN with or without Physical Therapy, Occupational Therapy, bath aide, Medical Social Worker (MSW)]  []  Durable Medical Equipment (DME) for home discharges (if patient does not already own one):               []  cane              []  home O2      []  enteral pump & formula          []  other:               []  walker           []  nebulizer       []  suction machine                     []  none or to be determined               []  wheelchair    []  BiPap            []  wound vac                []  commode     []  pneumovest  []  low air mattress (for pressure injuries stage 3 or worse)               []  hospital bed  []  hoyer lift        []  pleurX  []  Skilled Nursing Facility (SNF) placement for short-term, post-acute skilled services  []  Acute Rehab Unit (ARU) placement  []  New nursing home (NH) placement for custodial/long-term care  []  New Assisted Living Facility (ALF) or Board and Care (B&C) placement  []  Personal care assistance by family or employed caregiver  []  None     Findings and recommended plan of care discussed with patient/surrogate:   Extended Emergency Contact Information  Primary Emergency Contact: [facility]  Mobile Phone: [phone]  Relation: Other  Secondary Emergency Contact: Beutner,Virgina  Address: [address]           [place], [place] [address] [place]  Home Phone: [phone]  Mobile Phone: [phone]  Relation: Daughter  Preferred language: English    DWA Dr. Audelia Vernell HERO. Swier, MD       Staff Physician Note:  I have reviewed the patient's history and interval course, performed a physical examination and jointly formulated the assessment and plan with Dr Luba. The laboratory findings and other diagnostic data were independently reviewed and analyzed by me. All the pertinent information gathered were incorporated to guide the medical decision making involved in the patient's care today.  Sheril CANDIE Audelia, MD

## 2024-02-02 DIAGNOSIS — G7001 Myasthenia gravis with (acute) exacerbation: Secondary | ICD-10-CM

## 2024-02-02 LAB — Glucose,POC
GLUCOSE,POC: 85 mg/dL (ref 65–99)
GLUCOSE,POC: 112 mg/dL — ABNORMAL HIGH (ref 65–99)
GLUCOSE,POC: 98 mg/dL (ref 65–99)
GLUCOSE,POC: 96 mg/dL (ref 65–99)

## 2024-02-02 LAB — Hepatic Funct Panel: BILIRUBIN,TOTAL: 0.3 mg/dL (ref 0.1–1.2)

## 2024-02-02 LAB — Basic Metabolic Panel
CHLORIDE: 100 mmol/L (ref 96–106)
CREATININE: 1.65 mg/dL — ABNORMAL HIGH (ref 0.60–1.30)

## 2024-02-02 LAB — Phosphorus: PHOSPHORUS: 3.6 mg/dL (ref 2.5–4.5)

## 2024-02-02 LAB — Magnesium: MAGNESIUM: 1.8 meq/L (ref 1.4–1.9)

## 2024-02-02 LAB — Differential Automated: ABSOLUTE NEUT COUNT: 4.51 x10E3/uL (ref 1.80–6.90)

## 2024-02-02 LAB — CBC: PLATELET COUNT, AUTO: 211 x10E3/uL (ref 143–398)

## 2024-02-02 MED ORDER — SPIRIVA RESPIMAT 1.25 MCG/ACT IN AERS
2 | Freq: Every day | RESPIRATORY_TRACT | 0 refills | Status: AC
Start: 2024-02-02 — End: ?

## 2024-02-02 MED ORDER — POLYETHYLENE GLYCOL 3350 17 GM/SCOOP PO POWD
17 g | Freq: Every day | ORAL | 0 refills | 30.00000 days | Status: AC
Start: 2024-02-02 — End: ?
  Filled 2024-02-03 (×2): qty 510, 30d supply, fill #0

## 2024-02-02 MED ORDER — AZATHIOPRINE 50 MG PO TABS
75 mg | Freq: Two times a day (BID) | ORAL
Start: 2024-02-02 — End: ?

## 2024-02-02 MED ORDER — SENNOSIDES 8.6 MG PO TABS
2 | ORAL_TABLET | Freq: Every evening | ORAL | 0 refills | 15.00000 days | Status: AC | PRN
Start: 2024-02-02 — End: 2024-02-06
  Filled 2024-02-03 (×2): qty 30, 15d supply, fill #0

## 2024-02-02 MED ADMIN — AZATHIOPRINE 50 MG PO TABS: 75 mg | ORAL | @ 04:00:00 | Stop: 2024-02-03 | NDC 68084022901

## 2024-02-02 MED ADMIN — AZATHIOPRINE 50 MG PO TABS: 75 mg | ORAL | @ 17:00:00 | Stop: 2024-02-03 | NDC 68084022901

## 2024-02-02 MED ADMIN — POLYETHYLENE GLYCOL 3350 17 G PO PACK: 17 g | ORAL | @ 17:00:00 | Stop: 2024-02-03 | NDC 60687043199

## 2024-02-02 MED ADMIN — POLYETHYLENE GLYCOL 3350 17 G PO PACK: 17 g | ORAL | @ 03:00:00 | Stop: 2024-02-03

## 2024-02-02 MED ADMIN — POLYETHYLENE GLYCOL 3350 17 G PO PACK: 17 g | ORAL | @ 04:00:00 | Stop: 2024-02-03 | NDC 60687043199

## 2024-02-02 MED ADMIN — IPRATROPIUM-ALBUTEROL 0.5-2.5 (3) MG/3ML IN SOLN: 3 mL | RESPIRATORY_TRACT | @ 20:00:00 | Stop: 2024-02-03 | NDC 60687040579

## 2024-02-02 MED ADMIN — IPRATROPIUM-ALBUTEROL 0.5-2.5 (3) MG/3ML IN SOLN: 3 mL | RESPIRATORY_TRACT | @ 14:00:00 | Stop: 2024-02-03

## 2024-02-02 MED ADMIN — IPRATROPIUM-ALBUTEROL 0.5-2.5 (3) MG/3ML IN SOLN: 3 mL | RESPIRATORY_TRACT | @ 02:00:00 | Stop: 2024-02-03 | NDC 60687040579

## 2024-02-02 MED ADMIN — IPRATROPIUM-ALBUTEROL 0.5-2.5 (3) MG/3ML IN SOLN: 3 mL | RESPIRATORY_TRACT | @ 07:00:00 | Stop: 2024-02-03 | NDC 60687040579

## 2024-02-02 MED ADMIN — SPIRONOLACTONE 12.5 MG PO TABS: 25 mg | ORAL | @ 17:00:00 | Stop: 2024-02-03

## 2024-02-02 MED ADMIN — MONTELUKAST SODIUM 10 MG PO TABS: 10 mg | ORAL | @ 01:00:00 | Stop: 2024-02-03 | NDC 00904680861

## 2024-02-02 MED ADMIN — TIOTROPIUM BROMIDE 2.5 MCG/ACT IN AERS: 2 | RESPIRATORY_TRACT | @ 20:00:00 | Stop: 2024-02-03

## 2024-02-02 MED ADMIN — ASPIRIN 81 MG PO TBEC: 81 mg | ORAL | @ 17:00:00 | Stop: 2024-02-03 | NDC 77333003125

## 2024-02-02 MED ADMIN — BUPROPION HCL ER (SR) 100 MG PO TB12: 200 mg | ORAL | @ 17:00:00 | Stop: 2024-02-03 | NDC 68084069711

## 2024-02-02 MED ADMIN — HEPARIN SODIUM (PORCINE) 5000 UNIT/ML IJ SOLN: 5000 [IU] | SUBCUTANEOUS | @ 17:00:00 | Stop: 2024-02-03 | NDC 00409272330

## 2024-02-02 MED ADMIN — HEPARIN SODIUM (PORCINE) 5000 UNIT/ML IJ SOLN: 5000 [IU] | SUBCUTANEOUS | @ 04:00:00 | Stop: 2024-02-03 | NDC 00409272330

## 2024-02-02 MED ADMIN — PYRIDOSTIGMINE BROMIDE 60 MG PO TABS: 60 mg | ORAL | @ 07:00:00 | Stop: 2024-02-03 | NDC 68084049411

## 2024-02-02 MED ADMIN — PYRIDOSTIGMINE BROMIDE 60 MG PO TABS: 60 mg | ORAL | @ 17:00:00 | Stop: 2024-02-03 | NDC 68084049411

## 2024-02-02 MED ADMIN — PYRIDOSTIGMINE BROMIDE 60 MG PO TABS: 60 mg | ORAL | @ 01:00:00 | Stop: 2024-02-03 | NDC 68084049411

## 2024-02-02 MED ADMIN — LOSARTAN POTASSIUM 50 MG PO TABS: 100 mg | ORAL | @ 17:00:00 | Stop: 2024-02-03 | NDC 50268050511

## 2024-02-02 MED ADMIN — SENNOSIDES 8.6 MG PO TABS: 1 | ORAL | @ 03:00:00 | Stop: 2024-02-03

## 2024-02-02 MED ADMIN — SENNOSIDES 8.6 MG PO TABS: 1 | ORAL | @ 04:00:00 | Stop: 2024-02-03 | NDC 00904725261

## 2024-02-02 MED ADMIN — METOPROLOL SUCCINATE ER 25 MG PO TB24: 25 mg | ORAL | @ 17:00:00 | Stop: 2024-02-03 | NDC 60687039011

## 2024-02-02 MED ADMIN — FLUTICASONE-SALMETEROL 100-50 MCG/ACT IN AEPB: 1 | RESPIRATORY_TRACT | @ 07:00:00 | Stop: 2024-02-03 | NDC 00054032656

## 2024-02-02 MED ADMIN — FLUTICASONE-SALMETEROL 100-50 MCG/ACT IN AEPB: 1 | RESPIRATORY_TRACT | @ 17:00:00 | Stop: 2024-02-03 | NDC 00054032656

## 2024-02-02 MED ADMIN — DULOXETINE HCL 60 MG PO CPEP: 60 mg | ORAL | @ 17:00:00 | Stop: 2024-02-03 | NDC 68001059604

## 2024-02-02 MED ADMIN — FUROSEMIDE 20 MG PO TABS: 20 mg | ORAL | @ 17:00:00 | Stop: 2024-02-03 | NDC 00904717761

## 2024-02-02 NOTE — Progress Notes
 Pharmaceutical Services - Meds to [facility] Discharge Medication Reconciliation and Counseling Note    Patient Name: [name]  Medical Record Number: [id number]  Admit date: 01/31/2024 3:37 AM    Age: 88 y.o.  Sex: female  Allergies: Beta adrenergic blockers, Levofloxacin, Macrolides and ketolides, Sulfa antibiotics, Botulinum toxins, Pollen extract, Statins, and Plastic tape    Preferred Pharmacy:   [facility]  - [address], [place] - [address]  [address] [place] [address]         I reconciled the discharge medications and counseled the patient on all new prescriptions. Discharge prescriptions were delivered to bedside. The purpose, potential side effects, storage, missed doses and any special instructions related to each medication was discussed. Medications continued from home were also reviewed with the patient.    Discharge Medication List from AVS:     Changes To My Medications        START taking these medications        Dose Instructions Notes   polyethylene glycol powder  Commonly known as: Glycolax   Notes to patient: For constipation   17 g   Take 17 g (1 capful) dissolved in fluids by mouth daily. Hold for loose stool      senna 8.6 mg tablet  Notes to patient: For constipation    2 tablet   Take 2 tablets by mouth at bedtime as needed for Constipation. Hold for loose stool.             CONTINUE taking these medications        Dose Instructions Notes   albuterol  90 mcg/act inhaler  Commonly known as: Proventil  HFA, Ventolin  HFA  Notes to patient: As needed for wheezing or shortness of breath    INHALE 1-2 PUFF BY MOUTH EVERY 4 HOURS AS NEEDED FOR WHEEZING OR SHORTNESS OF BREATH      aspirin  81 mg EC tablet  Notes to patient: Aspirin     81 mg   Take 1 tablet (81 mg total) by mouth daily.      azaTHIOprine  50 mg tablet  Commonly known as: Imuran   Notes to patient: For myasthenia gravis    75 mg   Take 1.5 tablets (75 mg total) by mouth two (2) times daily.      buPROPion  (SR) 200 mg 12 hr tablet  Commonly known as: Wellbutrin  SR  Notes to patient: Antidepressant     TAKE 1 TAB BY MOUTH TWICE DAILY (ANTIDEPRESSANT)      DEXILANT  60 MG DR capsule  Generic drug: dexlansoprazole   Notes to patient: To prevent stomach acidity   60 mg   Take 1 capsule (60 mg total) by mouth daily.      DULoxetine  20 mg DR capsule  Commonly known as: Cymbalta   Notes to patient: Antidepressant    60 mg   TAKE 3 CAPSULES (60 MG TOTAL) BY MOUTH DAILY      EPINEPHrine  0.3 mg/0.3 mL auto-injector  Commonly known as: EpiPen     INJECT 0.3ML (0.3MG ) SUBCUTANEOUSLY AS NEEDED FOR ANAPHYLAXIS (ACUTE AND CHRONIC RESPIRATORY FAILURE WITH HYPOXIA)      exemestane  25 mg tablet  Commonly known as: Aromasin   Notes to patient: For breast cancer   25 mg   Take 1 tablet (25 mg total) by mouth daily.      FARXIGA  10 MG tablet  Generic drug: dapagliflozin   Notes to patient: For blood sugars    1 TAB BY MOUTH EVERY  DAY      fluticasone -vilanterol 100-25 mcg/act inhaler  Commonly known as: Breo Ellipta   Notes to patient: Inhaler     INHALE 1 PUFF BY MOUTH EVERY DAY (ACUTE AND CHRONIC RESPIRATORY FAILURE WITH HYPOXIA)      furosemide  20 mg tablet  Commonly known as: Lasix   Notes to patient: Diuretic    20 mg   Take 1 tablet (20 mg total) by mouth daily.      losartan  100 mg tablet  Commonly known as: Cozaar   Notes to patient: For heart failure     TAKE 1 TAB BY MOUTH EVERY DAY (HEART FAILURE, UNSPECIFIED)      metoprolol  succinate 25 mg 24 hr tablet  Commonly known as: Toprol -XL  Notes to patient: For heart rate control   25 mg   Take 1 tablet (25 mg total) by mouth daily.      montelukast  10 mg tablet  Commonly known as: Singulair   Notes to patient: For respiratory failure     TAKE 1 TAB BY MOUTH EVERY EVENING (ACUTE AND CHRONIC RESPIRATORY FAYLURE WITH HYPOXIA)      pyRIDostigmine  60 mg tablet  Commonly known as: Mestinon   Notes to patient: For myasthenia gravis     TAKE 1 TAB BY MOUTH THREE TIMES DAILY (MYASTHENIA GRAVIS) SPIRIVA  RESPIMAT 1.25 MCG/ACT inhaler  Generic drug: tiotropium bromide   Notes to patient: Inhaler    2 puff   Inhale 2 puffs daily.      spironolactone  25 mg tablet  Commonly known as: Aldactone   Notes to patient: Diuretic    25 mg   Take 1 tablet (25 mg total) by mouth daily.             STOP taking these medications        STOP taking these medications   clonazePAM  1 mg tablet  Commonly known as: KlonoPIN       penicillin  V potassium 500 mg tablet      tiotropium 18 mcg/act inhalation capsule  Commonly known as: Spiriva                 Prescriptions        These medications were sent to [facility] ([facility] [phone])  [address], [place] [address]      Hours: Mon-Fri 8AM-6PM, Sat, Sun, & Holidays 8AM-5PM (Closed 1PM-2PM for lunch) Phone: [phone]   polyethylene glycol powder  senna 8.6 mg tablet       Medication List    Bring a paper prescription for each of these medications  SPIRIVA  RESPIMAT 1.25 MCG/ACT inhaler       Information about where to get these medications is not yet available    Ask your nurse or doctor about these medications  azaTHIOprine  50 mg tablet     Constipation meds understood, will follow AVS for all home meds. Donnice Canales 02/02/2024 10:56 AM      Donnice Canales, PharmD, 02/02/2024, 10:55 AM

## 2024-02-02 NOTE — Nursing Clinical Note
 0725: Patient received in bed, on 2L O2 via NC, AxOx 4, in no apparent distress, fall precautions maintained, call light within reach.     Patient discharged on 02/02/2024 at time of 1834  Patient discharged to [facility] and transported by [employer]  Vitals signs upon discharge were Blood pressure 110/67, pulse 71, temperature 36.1 ?C (96.9 ?F), temperature source Temporal, resp. rate 18, weight 58.7 kg (129 lb 6.4 oz), SpO2 98%.  Skin condition bruising in abdomen  Patient alert and oriented x4    Discharge instructions/AVS and medication information given to patient  Patient education and medication education completed with patient  patient aware that the patient has new medications to be picked up from outside pharmacy  Patient had no home medications that needed to be returned to the patient from the hospital pharmacy.     Discharge orders include do not include home health care.  Patient has no DME needs.    Belongings sent with patient
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
## 2024-02-02 NOTE — Discharge Summary
 Discharge Summary   Name: [name] MRN: [id number] DOB: 12-27-1935   Admit Date: 01/31/2024 D/C Date:    LOS:   2 days    Admit Attending: Jon Milder, DO Discharge Attending: Audelia Sheril RAMAN., MD    PCP: [name], MD Discharge Provider: Donnice BECKER Essie, NP     Inpatient Care Team/ Consults:  Treatment Team:   Team: [facility], Geriatrics -  Case Manager: Kaitlyn Marybeth Ty KANDICE, RN   Outpatient Care Team  Patient Care Team:  [name] as Ambulatory Care Coordinator     Admission Diagnosis:  (reason for admission)  #Acute on Chronic Diarrhea  #Nausea/Vomiting  #IBS  #Leukocytosis  #pancreatic mass   #Elevation in Cr (not meeting AKI) -#Hypophosphatemia  #16mm lucent lesion of the left acetabulum  #COPD  #Chronic hypoxic respiratory failure 2L NC at baseline  #HFrEF  #Myasthenia Gravis  #BL Breast cancer  #GERD  #Major depression  #Chronic Benzo use Discharge Diagnosis:  (conditions treated during hospitalization)  #Acute on Chronic Diarrhea  #Significant stool burden on imaging suspicious for Overflow Diarrhea   #Nausea   #h/o irritable bowel disease   #CKD  #Lytic lesion, L acetabulum   #pancreatic cyst, incidental   #9mm lucent lesion of the left acetabulum   #COPD  #Chronic hypoxic respiratory failure 2L NC at baseline  #HFrEF  #Myasthenia Gravis  #BL Breast cancer  #GERD  #Major depression  #Chronic Benzo use   Disposition:  Residential care facility      Discharge Condition:  stable       HPI:   Per H&P on 01/31/2024, ''[name] is a 88 y.o. female with  pmh  myasthenia gravis, IBS, diverticulitis, breast cancer, COPD, HFrEF, acute on chronic hypoxic respiratory failure (on 2L NC), CKD stage IV, CAD, GERD who is presenting for Diarrhea as a transfer from the [facility].     Pt reports she is having multiple episodes of diarrhea as well as nausea and vomiting since September 2025. Since her discharge  ~ 11/27 from [facility], she notes  diarrhea which keep her up until 4am. She is having difficulty tolerating food due to having diarrhea immediately afterwards  .Has had significant abdominal pain in her lower abdomen. Notes that diarrhea has been worsening since September when she ran out of her Dexilant  for IBS as it is no longer covered by insurance. Getting this out of pocket has been cost prohibitive. Pt is not taking her other home meds consistently due to her n/v/d. Has not been taking her depression medications, (wellbuturin, duloxetine ), Still taking her inhalers. Does not remember which other medications she is taking.      Today 12/4-12/5 had 2 Bms that were non bloody and brown. At the peak of her diarrhea she was on the toilet constantly. No lightheadedness. Occasionally diarrhea is yellow.  No travel for 3 months out of the state. No lightheadedness or syncope. A relative around her had covid recently.      Denies hematochezia, fevers/chills, worsening SOB/CP, headaches, hematuria, dysuria.      Pt was recently hospitalized 11/25-11/27 for COPD exacerbation treated w/ duonebs and pred 40 x 5 days.      In [facility] ED   she was afebrile, BP 133/60, HR 90, Sat 99% on 2L. CBC w/ WBC 11.2. BMP w/ bicarb 31, Gluc 100, Cr 1.61 (bl ~1.4) and BUN 45. Phos low at 2.3. Mag 2.1. Lipase wnl. LFTs wnl. CT A/P completed w/o acute abnormalities. Received  500cc NS in ED. [facility] Course:    Patient is an 88 year old female with a history of myasthenia gravis, IBS, diverticulitis, breast cancer, COPD, HFrEF, chronic hypoxic respiratory failure (on 2L NC), CKD stage IV, CAD, and GERD, presented with diarrhea. The acute problems include acute on chronic diarrhea, with significant stool burden on imaging, suspicious for overflow diarrhea, as well as nausea. The patient has a history of irritable bowel syndrome and experienced an acute worsening of her chronic diarrhea, accompanied by nausea and vomiting, with large stool burden seen on imaging, likely due to constipation. There were no signs of infection, and medications, including pyridostigmine  (60 mg TID), which may increase peristalsis and contribute to GI upset, were also considered as potential contributing factors. Vomiting was described as more sputum-like without a cough. The treatment plan included Miralax  BID, senna BID, bisacodyl prn, and enemas as tolerated, with Zofran  prescribed as needed.     The patient's CKD is stable, and her hypophosphatemia resolved without treatment, likely being erroneous. An incidental lytic lesion in the left acetabulum was found on CT imaging in September and December 2025. While it may just be a bone cyst, it could also be a new metastatic lesion due to her history of breast cancer. The lesion was discussed with the patient, and further imaging may be required, with oncology informed and an expedited follow-up appointment requested. A pancreatic cyst was also found incidentally, but it is asymptomatic and unlikely to be contributing to her current presentation. Per [employer] guidelines, a contrast-enhanced MRCP is recommended in one year to ensure stability.    Chronic and stable conditions include COPD, for which the patient should continue Spiriva , albuterol  q4h prn, and fluticasone -vilanterol 100-25. Chronic hypoxic respiratory failure is stable at a baseline of 2L NC. HFrEF (EF 20%) is stable, with no volume overload on discharge. The patient's home medications include Lasix  20mg  daily, metoprolol  succinate 25mg  daily, losartan  100mg , and Farxiga  10mg , with a plan for pacemaker placement scheduled for March 03, 2024.     For myasthenia gravis, the patient is continuing home pyridostigmine  60mg  TID and azathioprine . She is also taking NIFS daily. The patient's bilateral breast cancer medication, exemestane  25mg  daily, was held as it is non-formulary.     Regarding major depression and chronic benzodiazepine use, her home medications, including clonazepam  1.5mg  at bedtime (not taking), was held. Duloxetine  60mg  daily and bupropion  were continued with resumption of outpatient doses. Patient was eager to leave the hospital and return to her senior living facility.    Outpatient Provider To-Do List  [ ]  PCP and cardiology appointments next week  [ ]  Onc appointment to discuss incidental lytic lesion in the left acetabulum  [ ]  MRCP in 1 year to evaluate pancreatic cyst        LACE+ Score: 72 (02/02/24 0801) High Risk of Readmission  Better Outcomes by Optimizing Safe Transitions - GERIATRIC CONSIDERATIONS     This patient meets criteria for the following BOOST risk conditions which could lead to readmission. Please consider further evaluation or action on the potential barriers below:    Problems with Medications:      18 scheduled medications active at discharge  High-risk active medications: aspirin  81 mg EC tablet [[id number]]      Prior Hospitalizations:    1 prior inpatient admission(s) in past 6 months      Health Literacy & Communication:            Patient Support:  Home Health Ordered: .                       Pending Labs   The following tests have been collected, but not yet resulted at the time of discharge.    Unresulted Labs (From admission, onward)      None          The following labs have a preliminary result at the time of discharge.  Please check back if the final results have changed.  Preliminary Resulted Labs (From admission, onward)      None          Discharge Medication List  Coumadin and Opiate Risk Assessment   This patient does not have coumadin on their discharge med list  No controlled substances on discharge med list      Changes To My Medications      START taking these medications     polyethylene glycol powder; Dose: 17 g; Take 17 g (1 capful) dissolved   in fluids by mouth daily. Hold for loose stool; Quantity: 1020 g; Refills:   0; Commonly known as: Glycolax ; Notes to patient: For constipation   senna 8.6 mg tablet; Dose: 2 tablet; Take 2 tablets by mouth at bedtime   as needed for Constipation. Hold for loose stool.; Quantity: 30 tablet;   Refills: 0; Notes to patient: For constipation      CONTINUE taking these medications     albuterol  90 mcg/act inhaler; INHALE 1-2 PUFF BY MOUTH EVERY 4 HOURS AS   NEEDED FOR WHEEZING OR SHORTNESS OF BREATH; Quantity: 18 g; Refills: 10;   Commonly known as: Proventil  HFA, Ventolin  HFA; Notes to patient: As   needed for wheezing or shortness of breath   aspirin  81 mg EC tablet; Dose: 81 mg; Take 1 tablet (81 mg total) by   mouth daily.; Quantity: 90 tablet; Refills: 3; Notes to patient: Aspirin     azaTHIOprine  50 mg tablet; Dose: 75 mg; Take 1.5 tablets (75 mg total)   by mouth two (2) times daily.; Refills: 0; Commonly known as: Imuran ;   Notes to patient: For myasthenia gravis    buPROPion  (SR) 200 mg 12 hr tablet; TAKE 1 TAB BY MOUTH TWICE DAILY   (ANTIDEPRESSANT); Quantity: 60 tablet; Refills: 10; Commonly known as:   Wellbutrin  SR; Notes to patient: Antidepressant    DEXILANT  60 MG DR capsule; Dose: 60 mg; Take 1 capsule (60 mg total) by   mouth daily.; Quantity: 90 capsule; Refills: 3; Generic drug:   dexlansoprazole ; Notes to patient: To prevent stomach acidity   DULoxetine  20 mg DR capsule; Dose: 60 mg; TAKE 3 CAPSULES (60 MG TOTAL)   BY MOUTH DAILY; Quantity: 270 capsule; Refills: 3; Commonly known as:   Cymbalta ; Notes to patient: Antidepressant    EPINEPHrine  0.3 mg/0.3 mL auto-injector; INJECT 0.3ML (0.3MG )   SUBCUTANEOUSLY AS NEEDED FOR ANAPHYLAXIS (ACUTE AND CHRONIC RESPIRATORY   FAILURE WITH HYPOXIA); Quantity: 2 each; Refills: 1; Commonly known as:   EpiPen    exemestane  25 mg tablet; Dose: 25 mg; Take 1 tablet (25 mg total) by   mouth daily.; Quantity: 90 tablet; Refills: 3; Commonly known as:   Aromasin ; Notes to patient: For breast cancer   FARXIGA  10 MG tablet; 1 TAB BY MOUTH EVERY DAY; Quantity: 30 tablet;   Refills: 11; Generic drug: dapagliflozin ; Notes to patient: For blood   sugars fluticasone -vilanterol 100-25 mcg/act inhaler; INHALE 1 PUFF BY MOUTH   EVERY DAY (  ACUTE AND CHRONIC RESPIRATORY FAILURE WITH HYPOXIA); Quantity:   180 each; Refills: 10; Commonly known as: Breo Ellipta ; Notes to patient:   Inhaler    furosemide  20 mg tablet; Dose: 20 mg; Take 1 tablet (20 mg total) by   mouth daily.; Quantity: 90 tablet; Refills: 3; Commonly known as: Lasix ;   Notes to patient: Diuretic    losartan  100 mg tablet; TAKE 1 TAB BY MOUTH EVERY DAY (HEART FAILURE,   UNSPECIFIED); Quantity: 30 tablet; Refills: 10; Commonly known as: Cozaar ;   Notes to patient: For heart failure    metoprolol  succinate 25 mg 24 hr tablet; Dose: 25 mg; Take 1 tablet (25   mg total) by mouth daily.; Quantity: 30 tablet; Refills: 3; Commonly known   as: Toprol -XL; Notes to patient: For heart rate control   montelukast  10 mg tablet; TAKE 1 TAB BY MOUTH EVERY EVENING (ACUTE AND   CHRONIC RESPIRATORY FAYLURE WITH HYPOXIA); Quantity: 30 tablet; Refills:   10; Commonly known as: Singulair ; Notes to patient: For respiratory   failure    pyRIDostigmine  60 mg tablet; TAKE 1 TAB BY MOUTH THREE TIMES DAILY   (MYASTHENIA GRAVIS); Quantity: 90 tablet; Refills: 10; Commonly known as:   Mestinon ; Notes to patient: For myasthenia gravis    SPIRIVA  RESPIMAT 1.25 MCG/ACT inhaler; Dose: 2 puff; Inhale 2 puffs   daily.; Quantity: 4 g; Refills: 0; Generic drug: tiotropium bromide ; Notes   to patient: Inhaler    spironolactone  25 mg tablet; Dose: 25 mg; Take 1 tablet (25 mg total) by   mouth daily.; Refills: 0; Commonly known as: Aldactone ; Notes to patient:   Diuretic      STOP taking these medications     clonazePAM  1 mg tablet; Commonly known as: KlonoPIN    penicillin  V potassium 500 mg tablet   tiotropium 18 mcg/act inhalation capsule; Commonly known as: Spiriva       Requested Appointments   Follow up appointment already scheduled   As directed      Please see cardiology and PCP as previously scheduled.        Scheduled Appointments  Future Appointments   Date Time Provider Department Center   02/03/2024 11:00 AM Leavy Gruber, MD CRD DIS 2020 [facility]   02/06/2024  9:20 AM [name], MD GERI IMS [address] [place]/Cen   02/06/2024  2:00 PM [name], MD GERI IMS 420 [place]   03/31/2024 11:00 AM Ely Carte, MD HEM/ONC 230 [facility]   04/07/2024 11:00 AM SM 2020 ECHO 03 CARDIOLOGY CARDIAC IMAGING CI [id number] [facility]          Discharge Physical Exam/Labs/Imaging/Procedures     Discharge Physical Exam:   BP 110/67  ~ Pulse 71  ~ Temp 36.1 ?C (96.9 ?F) (Temporal)  ~ Resp 18  ~ Wt 58.7 kg (129 lb 6.4 oz)  ~ SpO2 98%  ~ BMI 22.21 kg/m?   General appearance: alert, appears stated age, and cooperative  Neck: no adenopathy, no carotid bruit, no JVD, supple, symmetrical, trachea midline, and thyroid not enlarged, symmetric, no tenderness/mass/nodules  Lungs: clear to auscultation bilaterally  Heart: regular rate and rhythm, S1, S2 normal, no murmur, click, rub or gallop  Abdomen: soft, non-tender; bowel sounds normal; no masses,  no organomegaly  Extremities: extremities normal, atraumatic, no cyanosis or edema  Skin: Skin color, texture, turgor normal. No rashes or lesions  Neurologic: Grossly normal     Relevant labs:     CBC:  Lab Results  Component Value Date    WBC 6.62 02/02/2024    HGB 10.8 (L) 02/02/2024    HCT 33.1 (L) 02/02/2024    PLT 211 02/02/2024     LFT:  Lab Results   Component Value Date    TOTPRO 5.9 (L) 02/02/2024    ALBUMIN 3.5 (L) 02/02/2024    BILITOT 0.3 02/02/2024    ALKPHOS 49 02/02/2024    AST 18 02/02/2024    ALT 10 02/02/2024       BMP:  Lab Results   Component Value Date    NA 138 02/02/2024    K 4.0 02/02/2024    CL 100 02/02/2024    CO2 30 02/02/2024    BUN 32 (H) 02/02/2024    CREAT 1.65 (H) 02/02/2024    CALCIUM  9.0 02/02/2024    GLUCOSE 94 02/02/2024       Cal/iCal/MG/Ph:  Lab Results   Component Value Date    CALCIUM  9.0 02/02/2024    MG 1.8 02/02/2024    PHOS 3.6 02/02/2024     Coags:  No results found for: ''PT'', ''APTT'', ''INR''                Relevant Imaging Studies (most recent results only):   CT abd+pelvis w contrast  Result Date: 01/31/2024  IMPRESSION: 1.  No acute CT abnormality in the abdomen or pelvis. 2.  There is a 15 mm lucent lesion in the left acetabulum and is again seen, which may represent a subchondral cyst, but given history of breast cancer may be concerning for a new lytic lesion. Consider MRI for better characterization. 3.  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 The [employer] Guidelines recommend contrast enhanced MRCP in one year to ensure stability.* Note: - Recommendation for asymptomatic patients with no increased risk of pancreatic cancer. - Patients who are not medically fit for surgery should not undergo further evaluation of incidentally found pancreatic cysts, irrespective of cyst size. *Reference: ACG Clinical Guideline: Diagnosis and Management of Pancreatic Cysts, Official Journal of the [employer], April 2018. I, [name], M.D., have reviewed this radiological study personally and I am in full agreement with the findings of the report presented here. Dictated by: Gleen Isaacs   01/31/2024 3:21 AM Signed by: [name]   01/31/2024 3:36 AM    Echo adult transthoracic complete  Result Date: 01/22/2024   1. Normal left ventricular size.  2. Mild concentric left ventricular hypertrophy.  3. There are LV regional wall motion abnormalities.  4. There is hypokinesis to akinesis of the entire length of the anteroseptal and septal walls.  5. LV systolic function is severely reduced. LV ejection fraction is approximately 25 to 30%.  6. Abnormal LV diastolic function (Grade I).  7. Normal right ventricle in size. Normal RV systolic function.  8. Mild mitral valve regurgitation. 9. Mild tricuspid regurgitation. 10. There are no prior studies on this patient for comparison purposes. [id number] Fausto Alpha MD Electronically signed by [id number] Fausto Alpha MD on 01/22/2024 at 6:57:43 PM  Sonographer: [facility]    Final     XR chest ap portable (1 view)  Result Date: 01/21/2024  IMPRESSION: Stable cardiac silhouette. Calcification of the thoracic aorta. Bands of atelectasis or scar within the lung bases. Mild bronchial wall thickening. No focal consolidation. No gross effusions. Degenerative changes of the spine.  Signed by: Rollo Daring   01/21/2024 5:41 PM        Procedures & Operations Performed:   none         Nutrition Recommendations & Malnutrition Assessment   I have seen and examined the patient and agree with the RD assessment and plan. See RD note from this hospital admission for additional details.     Patient meets criteria for Unable to determine malnutrition status at this time    (current weight 58.7 kg (129 lb 6.4 oz), BMI  ; IBW 54 kg/120 lbs, IBW %: 108)     Continue 2 gram Na restricted diet   - Limit concentrated sweets/juices d/t concern for refeeding syndrome     Oral Nutrition Supplement: monitor PO intake if consuming less than 50% of meals recommend Boost Glucose Control BID    Micronutrients   - Recommend nephro-vite daily    - Recommend 100 mg thiamine daily x7 days   - Recommend checking CRP level, if less than 2 check Vitamin D and Zinc level     Daily BMP, Mg, Phos to monitor for refeeding syndrome   - Medical team to replace electrolytes as needed     Weekly weights to monitor trends     Author: Rosina Deeds MS, RD, CNSC pgr [phone]  01/31/2024 2:25 PM       Discharge Diet Orders:  2gm Sodium or low salt   As directed      High Fiber diet. 2000mg  or less of sodium per day. Do not use salt. Avoid   foods with added salt such as highly processed foods, packaged foods,   salty foods, and foods from restaurants. To season your food, use herbs,   spices, vinegar, lemon, lime, or other salt-free herb/spice blends.      Skin/Wound         Discharge Activity Orders   There are no active discharge activity orders for this encounter.   Rehab Assessment   No PT evaluation this encounter  No OT evaluation this encounter      Case Manager/Home Health Assessment   Discharge Information  Discharge Address: [facility]  -  [address]  [address]   [place] [address] (02/02/24 0722)              Home Health Orders   Home Health Aide   As directed      Home Health Occupational Therapy   As directed      Occupational therapy works with the patient/family/caregiver in regards   to how to perform activities of daily living (ADLs) as independently as   possible, in addition to providing recommendations for adaptive equipment,   exercises to improve/maintain upper extremity ROM/strength, etc.    Home Health Physical Therapy   As directed      Home Health Physician Attestation for Home Health   As directed      Patient Name: TONIMARIE GRITZ    Patient MRN: [id number]      Attending Provider: Audelia Sheril RAMAN., MD        ATTESTATION OF FACE-TO-FACE PHYSICIAN-PATIENT ENCOUNTER    (Face to Face encounter must occur 90 days prior to, or 30 days after the   Start of Care date)    Physician ordering/certifying Home Health services: Vernell HERO. Swier, MD    Date of Face-to-Face Encounter: 02/01/2024      I certify that this patient is under my care and that  a face-to-face   encounter visit was conducted with the patient as noted above.      Attending Physician [NPI]: Audelia Sheril RAMAN., MD [phone]          Ordering Provider:Rachel M. Swier, MD         Date: 02/01/2024        Time: 10:55 AM          (For non-physician providers, please send document for co-signature by the   attending provider)      Goals of Care Note (if new for this encounter)  No notes of this type exist for this encounter.        Discharge plan of care discussed with attending Dr. Jatoria Kneeland. Donnice BECKER Essie, NP  02/02/2024 12:15 PM  [facility].       Staff Physician Note:  I obtained the patient's history and interval course, and jointly performed a physical examination with Donnice Essie, NP. The laboratory findings and other diagnostic data were independently reviewed and analyzed by me. All the pertinent information gathered were incorporated to guide the medical decision making involved in the patient's care today.  Patient had a bowel movement during this admission on 02/01/24 and her symptoms resolved. She was advised to continue with a bowel regimen at home. She will need her labs rechecked this week. She has follow up with her cardiologist and pcp.   Sheril CANDIE Audelia, MD

## 2024-02-02 NOTE — Nursing Clinical Note
 9454: 8bts of Vtach, MD is notified, Mg lab is ordered

## 2024-02-02 NOTE — End of Shift Summary
 Patient's Clinical Goal:  Clinical Goal(s) for the Shift: VSS, rest, comfort  Identify possible barriers to advancing the care plan:   Stability of the patient: Moderately Unstable - medium risk of patient condition declining or worsening    Progression of Patient's Clinical Goal:   Pt AO x 4, on 2L NC, SR with BBB on tele  No pain but mild ABD discomfort, no s/s of SOB or any distress noted in shift.  Voiding well, no BM, bowel regimen given  Call light within reach, bed in lowest position and bed alarm on. All needs attended to.    BP 106/48 (Patient Position: Lying)  ~ Pulse 69  ~ Temp 36.3 ?C (97.4 ?F) (Oral)  ~ Resp 18  ~ Wt 58.7 kg (129 lb 6.4 oz)  ~ SpO2 98%  ~ BMI 22.21 kg/m?      Pending:  Poss back to [facility] today      GERIATRICS END OF SHIFT - DISCHARGE MARKERS of Instability Checklist  Nursing assessment:     Indicator   Cutoff Check if indicator needs to be addressed (meets cutoff)   Situation/Action/Comment   O2 requirement New since admission []     PO intake Less than 50% []     Urination None in past 8 hours/last shift []     Foley New since admission []     Pain Present  []     Bowel movements  <1 in 2 days or >3 in 24 hours []     Delirium Unable to follow simple commands or participate with PT/OT []     Mobility Unable to stand and/or walk to bathroom []  OOB to chair 0  times.  Ambulated 20 feet.       BOOST / SAFE TRANSITION - DISCHARGE INDICATORS    Indicator Check if indicator has red ''P''    Situation/Action/Comment     Problems with Medications  []     Psychological  []     Principal Diagnosis  []     Physical Limitations  []     Poor Health Literacy  []     Patient Support  [x]     Prior Hospitalizations  []     Palliative Care  []       DELIRIUM PREVENTION  Protocols Strategies Check if implemented Comments   Risk factors >3 present- High risk []     Purposeful orientation Reorient, purposeful orienting conversation  Familiar objects in room [x]   []     Therapeutic activities Volunteer visit  Cognitive stimulation activities: games, reading, music []   []     Vision & hearing Assistance with: leisure centre manager  Assistance with: hearing aids/hearing amplifier []   []     Feeding & hydration Assistance with feeding  Assistance with dentures []   []     Sleep hygiene Shades/blinds/lights on during day, limit naps during day  Quiet environment, consolidate care [x]     Mobilization BMAT 3-4: Ambulate TID  BMAT 2: OOB daily to chair for meals <= 2 hours, each time  BMAT 1: OOB to cardiac chair daily for meals <= 2 hours, each time [x]   []   []     Pain Non-narcotics ATC  Non-pharmacological: oil/aromatherapy, massage, music []   []     Maintain safety Fall precautions, volunteer visit, family at bedside, tele sitter, constant observer []     Manage agitation Redirect with calm, gentle voice and avoid confrontation  Avoid restraints and use alternative to restraints  Doll, music or animal therapy, as appropriate  Volunteer for companionship if safe and appropriate [x]   []   []   []   DELIRIUM: CAM (+)  Protocols Strategies Check if implemented Comments   New-onset MD contacted  Delirium order-set initiated  Bladder scan to R/O retention  Assess stool impaction  Medication reviewed with pharmacist []   []   []   []   []  MD Name:   Existing Manage and prevent further delirium [] 

## 2024-02-02 NOTE — Consults
 5NW CASE MANAGER DISCHARGE PLANNING PROGRESS NOTE  Department of Care Coordination  Covering "Sunday 02/02/2024       Admit Date:[id number]  Anticipated Date of Discharge: 02/02/2024    Following MD:Kolangara, Shyam S., MD    Today's Short Update   02/02/2024 0723 AM - Home Health tasked and faxed to [facility] for ROC, in process. Alternate agencies were tasked by prior CM to offer choice as case endorsement reports would like to explore options    02/02/2024 1000 AM - Visited patient at bedside, patient advises at this time she has decided she would not want any home health services (resumption of HH with [facility], or start a new one with an alternate agency). She advises she will contact her PCP in January but that she has plans to go to [place] and will not be home to receive services. She confirms to have oxygen at home. She is requesting for NEMT assistance home with o2 as there is no one able to bring her o2 here or assist with transport at this time.    02/02/2024 1228 PM - Spoke with Gwen at [facility] who advises that patient was formally discharged from Home Health prior to hospitalization, and that if patient is travelling she is not considered homebound therefore home health would not be approved. In the event patient would need home health and would be homebound later, this would be evaluated by patient's PCP     02/02/2024 1309 PM - Spoke with [facility], spoke with Lizette [phone] who advises they will need patient's discharge summary faxed to [phone] for patient's return. Printed copy of DC Summary and AVS will also need to go with patient. SW working on transportation      12" /08/2023 1503 PM - Transportation arranged by SW for 1800 PM     Disposition   Expected Disposition: Home w/Home Health  Post-Acute Recommendations: Nursing  Discharge Address: [facility]  -  [address]  [address]   [place] [address]  Family/Support System in agreement with the current discharge plan: Yes, in agreement and participating           Multidisciplinary Team Member Plan of Care   Interdisciplinary rounds were conducted with the multidisciplinary team including the clinical social worker and nurse case manager. The patient's plan of care and discharge plan were discussed and formulated based on the patient's specific needs.                                                Brazos Sandoval Daisylyn Garcia-Kailly Richoux, RN, MSN-L, MHA GR, RN, PHN, IQCI, HIT, CCM  5NW Senior Clinical Case Production Designer, Theatre/television/film, Department of Care Coordination and Clinical Social Work     Geriatrics Unit: [phone]                       Fax: [phone]                                        Pager: [phone]

## 2024-02-03 ENCOUNTER — Ambulatory Visit: Payer: BLUE CROSS/BLUE SHIELD

## 2024-02-03 ENCOUNTER — Ambulatory Visit: Payer: BLUE CROSS/BLUE SHIELD | Attending: Geriatric Medicine

## 2024-02-03 DIAGNOSIS — I5022 Chronic systolic (congestive) heart failure: Principal | ICD-10-CM

## 2024-02-03 DIAGNOSIS — I700 Atherosclerosis of aorta: Secondary | ICD-10-CM

## 2024-02-03 DIAGNOSIS — E785 Hyperlipidemia, unspecified: Secondary | ICD-10-CM

## 2024-02-03 DIAGNOSIS — I10 Essential (primary) hypertension: Secondary | ICD-10-CM

## 2024-02-03 LAB — Glucose,POC: GLUCOSE,POC: 78 mg/dL (ref 65–99)

## 2024-02-03 MED ADMIN — MONTELUKAST SODIUM 10 MG PO TABS: 10 mg | ORAL | @ 01:00:00 | Stop: 2024-02-03 | NDC 00904680861

## 2024-02-03 MED ADMIN — PYRIDOSTIGMINE BROMIDE 60 MG PO TABS: 60 mg | ORAL | @ 01:00:00 | Stop: 2024-02-03 | NDC 68084049411

## 2024-02-03 NOTE — Consults
 CARDIOLOGY NOTE      PATIENT: [name]   MRN: [id number]   DOB: [birth year]   DATE OF SERVICE: 02/03/2024    PCP:  Kaitlyn Kaitlyn POUR., [name]      Problem List Items Addressed This Visit    None  Visit Diagnoses         Systolic CHF, chronic (HCC/RAF)    -  Primary      Atherosclerosis of aorta          Essential hypertension          Dyslipidemia                      Interval History:     02/03/2024:   No acute cardiac complaints    09/2023:  With MG, she gets drooping in eyelids and weakness in the legs     09/04/2023:   Pt with dyspnea and chest pain with exertion  She is taking Lasix  on daily basis        [name], [name]  [name], [name]; Kaitlyn Kaitlyn POUR., [name]  Hi,    Sorry for the late reply. I'm okay with started a beta blocker from myasthenia gravis perspective. The risk of exacerbation in this case is very small. Different classes of beta blockers are not studied as far as I know, so I do not have any preference. Lower dose is likely safer. Please also keep in mind that she has COPD.    Best,  [name]          Previous Messages       ----- Message -----  From: [name], [name]  Sent: 08/08/2023  11:53 AM PDT  To: Kaitlyn [name], [name]; [name], [name]    Hi,  Pt with heart failure and reduced LV EF.  Do you have a concern for chronic use of BBlockers? Any preference for Metoprolol  or Coreg?    BBlockrs are listed in her Allergies: Avoid d/t Myasthenia Gravis        Thanks,  [name]       07/25/2023:  A/w daughter  Recent hospitalization for dyspnea.   She was started on Lasix . She was taking up to 3 tabs per day  She last took Lasix  prior to angiogram  Uses a walker   Pt notes chest tightness and dyspnea    08/2022:  The patient feels SOB/dyspnea and tired  She notes chest pain and fibromyalgia   Ambulates with a walker  ET: 1/2 block, stable     Prior Encounter:     This is a 88 y.o. female who is here for cardiac evaluation.  Indication:   SOB    The patient is a/w friend, [name]    Pt was recently hospitalized with SOB and chest pain.   Pt ambulates with a walker for precautionary reasons given history of falls.   With activity, the patient reports SOB/dypnea. She walks the dog 1/2 block  Her symptoms have been ongoing for the past 3 years, progressive worsening.   The patient notes allergies and asthma.   About 1 year ago, she could walk 1.5 blocks         Past Medical History:   Diagnosis Date    Asthma     Breast cancer (HCC/RAF) 12/20/2018    CHF (congestive heart failure) (HCC/RAF)     COPD (chronic obstructive pulmonary disease) (HCC/RAF)     Diabetes mellitus (HCC/RAF)  Renal disorder        No past surgical history on file.    Allergies:   Allergies   Allergen Reactions    Beta Adrenergic Blockers Other (See Comments)     Avoid d/t Myasthenia Gravis  Avoid d/t Myasthenia Gravis      Levofloxacin Other (See Comments)     Flare of Myasthenia Gravis    Macrolides And Ketolides Other (See Comments)     Avoid d/t Myasthenia Gravis  Use with caution d/t Myasthenia Gravis  Use with caution d/t Myasthenia Gravis      Sulfa Antibiotics Other (See Comments)     May possibly have caused deafness in one ear  May possibly have caused deafness in one ear  other      Botulinum Toxins Other (See Comments)     Muscle weakness r/t Myasthenia Gravis  Muscle weakness r/t Myasthenia Gravis      Pollen Extract Wheezing    Statins Other (See Comments)     Muscle aches  Muscle aches  Muscle aches      Plastic Tape Itching and Other (See Comments)     Turns the skin red where applied       Social History     Socioeconomic History    Marital status: Widowed   Tobacco Use    Smoking status: Former     Current packs/day: 0.00     Types: Cigarettes     Quit date: 1985     Years since quitting: 40.9    Smokeless tobacco: Former   Substance and Sexual Activity    Alcohol use: Never    Drug use: Never    Sexual activity: Not Currently     Social Drivers of Health     Financial Resource Strain: Low Risk (01/31/2024)    Financial Resource Strain     Difficulty of Paying Living Expenses: Not hard at all       No family history on file.  Adopted     Objective:     BP 110/55  ~ Pulse 85  ~ Wt 132 lb (59.9 kg)  ~ SpO2 96%  ~ BMI 22.66 kg/m?     Vitals:    02/03/24 1103   BP: 110/55   Pulse: 85   SpO2: 96%   Weight: 132 lb (59.9 kg)       BP Readings from Last 3 Encounters:   02/03/24 110/55   02/02/24 120/54   01/31/24 100/63       Body mass index is 22.66 kg/m?[name]    Wt Readings from Last 3 Encounters:   02/03/24 132 lb (59.9 kg)   01/31/24 129 lb 6.4 oz (58.7 kg)   01/30/24 130 lb (59 kg)          General Exam: no apparent distress.    Neurologic/Psychiatric: alert and oriented. Affect is appropriate.  HEENT: Sclerae are anicteric. There is no epistaxis.  Cardiac: regular in rate and rhythm.    Respiratory: non-labored breathing. No use of accessory muscles. No wheeze.  Abdomen: bowel sounds are present. Abdomen is soft. No rebound.    Extremities: no edema. No cyanosis or clubbing.    Integument: no petechiae. No acute rash.         Laboratory:  Lab Results   Component Value Date    NA 138 02/02/2024    K 4.0 02/02/2024    CL 100 02/02/2024    CO2 30 02/02/2024    BUN 32 (H)  02/02/2024    CREAT 1.65 (H) 02/02/2024    GLUCOSE 94 02/02/2024    CALCIUM  9.0 02/02/2024     Lab Results   Component Value Date    CKTOT 51 12/11/2022    CKTOT 75 07/05/2022    CKTOT 54 01/07/2022    TROPONIN 25 (H) 01/22/2024    TROPONIN 23 (H) 01/21/2024    TROPONIN 21 (H) 01/21/2024    TROPONIN 0.05 (H) 12/19/2020    TROPONIN 0.07 (H) 12/19/2020    TROPONIN 0.06 (H) 12/18/2020     Lab Results   Component Value Date    BNP 525 (H) 01/21/2024     Lab Results   Component Value Date    WBC 6.62 02/02/2024    HGB 10.8 ([name]) 02/02/2024    HCT 33.1 ([name]) 02/02/2024    MCV 98.5 02/02/2024    PLT 211 02/02/2024     Lab Results   Component Value Date    CHOL 160 12/11/2022    CHOLHDL 55 12/11/2022    CHOLDLQ 76 12/11/2022    TRIGLY 181 (H) 12/11/2022     Lab Results   Component Value Date    PT 12.5 07/18/2023    INR 0.9 07/18/2023     No components found for: ''HA1C''  Lab Results   Component Value Date    TSH 1.6 09/24/2023       Patient Active Problem List   Diagnosis    Acute medial meniscal tear, left, subsequent encounter    Angioedema    Anxiety    Asthmatic bronchitis, mild persistent, with acute exacerbation    Chronic cholecystitis    Chronic interstitial cystitis    CKD (chronic kidney disease), stage III (HCC/RAF)    COPD mixed type (HCC/RAF)    Major depression, recurrent, chronic    Diabetes mellitus (HCC/RAF)    Esophageal reflux    Eustachian tube dysfunction    Fibromyalgia    H/O arthroscopy    Insomnia    Myasthenia gravis with exacerbation (HCC/RAF)    Nocturnal hypoxemia    Osteoarthritis of knee    Renal cyst    Seasonal and perennial allergic rhinitis    Spondylosis without myelopathy or radiculopathy, lumbar region    Urethral caruncle    Vaginal atrophy    Benzodiazepine dependence (HCC/RAF)    Breast cancer (HCC/RAF)    Refractive error    Ptosis of eyelid    Pseudophakia of both eyes    Aortic calcification    HTN (hypertension)    Immunosuppressed status    Acute chest pain    Atypical chest pain    Heart failure with reduced ejection fraction LVEF <=40% (HCC/RAF)    Epiretinal membrane, bilateral    PVD (posterior vitreous detachment), bilateral    Chorioretinal scars, bilateral    Hyperkalemia       Outpatient Medications Prior to Visit   Medication Sig    ALBUTEROL  90 mcg/act inhaler INHALE 1-2 PUFF BY MOUTH EVERY 4 HOURS AS NEEDED FOR WHEEZING OR SHORTNESS OF BREATH    aspirin  81 mg EC tablet Take 1 tablet (81 mg total) by mouth daily.    azaTHIOprine  50 mg tablet Take 1.5 tablets (75 mg total) by mouth two (2) times daily.    BUPROPION , SR, 200 mg 12 hr tablet TAKE 1 TAB BY MOUTH TWICE DAILY (ANTIDEPRESSANT)    DEXILANT  60 MG DR capsule Take 1 capsule (60 mg total) by mouth daily.    DULOXETINE  20 mg  DR capsule TAKE 3 CAPSULES (60 MG TOTAL) BY MOUTH DAILY EPINEPHrine  0.3 mg/0.3 mL auto-injector INJECT 0.3ML (0.3MG ) SUBCUTANEOUSLY AS NEEDED FOR ANAPHYLAXIS (ACUTE AND CHRONIC RESPIRATORY FAILURE WITH HYPOXIA)    exemestane  25 mg tablet Take 1 tablet (25 mg total) by mouth daily.    FARXIGA  10 MG tablet 1 TAB BY MOUTH EVERY DAY    fluticasone -vilanterol 100-25 mcg/act inhaler INHALE 1 PUFF BY MOUTH EVERY DAY (ACUTE AND CHRONIC RESPIRATORY FAILURE WITH HYPOXIA)    furosemide  20 mg tablet Take 1 tablet (20 mg total) by mouth daily.    LOSARTAN  100 mg tablet TAKE 1 TAB BY MOUTH EVERY DAY (HEART FAILURE, UNSPECIFIED)    metoprolol  succinate 25 mg 24 hr tablet Take 1 tablet (25 mg total) by mouth daily.    MONTELUKAST  10 mg tablet TAKE 1 TAB BY MOUTH EVERY EVENING (ACUTE AND CHRONIC RESPIRATORY FAYLURE WITH HYPOXIA)    polyethylene glycol powder Take 17 g (1 capful) dissolved in fluids by mouth daily. Hold for loose stool    predniSONE  10 mg tablet Take 4 tablets (40 mg total) by mouth daily for 3 days.    PYRIDOSTIGMINE  60 mg tablet TAKE 1 TAB BY MOUTH THREE TIMES DAILY (MYASTHENIA GRAVIS)    senna 8.6 mg tablet Take 2 tablets by mouth at bedtime as needed for Constipation. Hold for loose stool.    spironolactone  25 mg tablet Take 1 tablet (25 mg total) by mouth daily.    tiotropium bromide  (SPIRIVA  RESPIMAT) 1.25 mcg/act inhaler Inhale 2 puffs daily.     No facility-administered medications prior to visit.       Current Outpatient Medications   Medication Sig    ALBUTEROL  90 mcg/act inhaler INHALE 1-2 PUFF BY MOUTH EVERY 4 HOURS AS NEEDED FOR WHEEZING OR SHORTNESS OF BREATH    aspirin  81 mg EC tablet Take 1 tablet (81 mg total) by mouth daily.    azaTHIOprine  50 mg tablet Take 1.5 tablets (75 mg total) by mouth two (2) times daily.    BUPROPION , SR, 200 mg 12 hr tablet TAKE 1 TAB BY MOUTH TWICE DAILY (ANTIDEPRESSANT)    DEXILANT  60 MG DR capsule Take 1 capsule (60 mg total) by mouth daily.    DULOXETINE  20 mg DR capsule TAKE 3 CAPSULES (60 MG TOTAL) BY MOUTH DAILY EPINEPHrine  0.3 mg/0.3 mL auto-injector INJECT 0.3ML (0.3MG ) SUBCUTANEOUSLY AS NEEDED FOR ANAPHYLAXIS (ACUTE AND CHRONIC RESPIRATORY FAILURE WITH HYPOXIA)    exemestane  25 mg tablet Take 1 tablet (25 mg total) by mouth daily.    FARXIGA  10 MG tablet 1 TAB BY MOUTH EVERY DAY    fluticasone -vilanterol 100-25 mcg/act inhaler INHALE 1 PUFF BY MOUTH EVERY DAY (ACUTE AND CHRONIC RESPIRATORY FAILURE WITH HYPOXIA)    furosemide  20 mg tablet Take 1 tablet (20 mg total) by mouth daily.    LOSARTAN  100 mg tablet TAKE 1 TAB BY MOUTH EVERY DAY (HEART FAILURE, UNSPECIFIED)    metoprolol  succinate 25 mg 24 hr tablet Take 1 tablet (25 mg total) by mouth daily.    MONTELUKAST  10 mg tablet TAKE 1 TAB BY MOUTH EVERY EVENING (ACUTE AND CHRONIC RESPIRATORY FAYLURE WITH HYPOXIA)    polyethylene glycol powder Take 17 g (1 capful) dissolved in fluids by mouth daily. Hold for loose stool    predniSONE  10 mg tablet Take 4 tablets (40 mg total) by mouth daily for 3 days.    PYRIDOSTIGMINE  60 mg tablet TAKE 1 TAB BY MOUTH THREE TIMES DAILY (MYASTHENIA GRAVIS)  senna 8.6 mg tablet Take 2 tablets by mouth at bedtime as needed for Constipation. Hold for loose stool.    spironolactone  25 mg tablet Take 1 tablet (25 mg total) by mouth daily.    tiotropium bromide  (SPIRIVA  RESPIMAT) 1.25 mcg/act inhaler Inhale 2 puffs daily.    [DISCONTINUED] azaTHIOprine  50 mg tablet Take 1.5 tablets (75 mg total) by mouth daily. (Patient taking differently: Take 1.5 tablets (75 mg total) by mouth two (2) times daily.)    [DISCONTINUED] clonazePAM  1 mg tablet TAKE 1 AND HALF TABLET BY MOUTH AT NIGHT    [DISCONTINUED] dapagliflozin  (FARXIGA ) 10 mg tablet Take 1 tablet (10 mg total) by mouth daily.    [DISCONTINUED] DEXILANT  60 MG DR capsule Take 1 capsule (60 mg total) by mouth daily.    [DISCONTINUED] PENICILLIN  V POTASSIUM 500 mg tablet TAKE 1 TAB BY MOUTH TWICE DAILY (BACTERIAL INFECTION)    [DISCONTINUED] REPATHA  SURECLICK 140 MG/ML SOAJ INJECT 1 PEN UNDER THE SKIN EVERY 14 DAYS (HEART FAILURE, UNSPECIFIED)    [DISCONTINUED] sodium chloride  0.9% IV soln  (Patient not taking: Reported on 01/31/2024)    [DISCONTINUED] tiotropium 18 mcg/act inhalation capsule Place 1 capsule (18 mcg total) into inhaler and inhale daily. (Patient not taking: Reported on 01/31/2024)    [DISCONTINUED] tiotropium bromide  (SPIRIVA  RESPIMAT) 1.25 mcg/act inhaler Inhale 2 puffs daily. (Patient not taking: Reported on 01/31/2024)    [DISCONTINUED] tiotropium bromide  1.25 mcg/act inhaler Inhale 2 puffs daily.    [DISCONTINUED] ULTOMIRIS 1100 MG/11ML SOLN  (Patient not taking: Reported on 01/31/2024)     No current facility-administered medications for this visit.     Facility-Administered Medications Ordered in Other Visits   Medication Dose Route Frequency    [COMPLETED] iodixanol  (Visipaque ) 320 mg/mL inj 90 mL  90 mL Intravenous Once    [COMPLETED] polyethylene glycol pwd pkt 17 g  17 g Oral Once    [COMPLETED] potassium & sodium phosphates  280-160-250 mg pwd pkt 1 packet  1 packet Oral Once    [COMPLETED] senna tab 1 tablet  1 tablet Oral Once    [COMPLETED] sodium chloride  0.9% IV soln bolus 500 mL  500 mL Intravenous Once    [DISCONTINUED] acetaminophen  650 mg supp 650 mg  650 mg Rectal Q6H PRN    [DISCONTINUED] acetaminophen  tab 650 mg  650 mg Oral Q6H PRN    [DISCONTINUED] albuterol  90 mcg/act inh 2 puff  2 puff Inhalation Q4H PRN    [DISCONTINUED] aspirin  EC tab 81 mg  81 mg Oral Daily    [DISCONTINUED] azaTHIOprine  tab 75 mg  75 mg Oral Daily    [DISCONTINUED] azaTHIOprine  tab 75 mg  75 mg Oral BID    [DISCONTINUED] bisacodyl EC tab 5 mg  5 mg Oral Daily PRN    [DISCONTINUED] buPROPion  (SR) tab ER12 200 mg  200 mg Oral Daily    [DISCONTINUED] DULoxetine  DR cap 60 mg  60 mg Oral Daily    [DISCONTINUED] fluticasone -salmeterol 100-50 mcg/act diskus inhaler 1 puff  1 puff Inhalation BID    [DISCONTINUED] furosemide  tab 20 mg  20 mg Oral Daily    [DISCONTINUED] heparin  5000 unit/mL inj 5,000 Units 5,000 Units Subcutaneous BID    [DISCONTINUED] ipratropium-albuterol  0.5-2.5 mg/3 mL inh soln 3 mL  3 mL Nebulization QID    [DISCONTINUED] losartan  tab 100 mg  100 mg Oral Daily    [DISCONTINUED] metoprolol  succinate tab ER24 25 mg  25 mg Oral Daily    [DISCONTINUED] montelukast  tab 10 mg  10  mg Oral QPM    [DISCONTINUED] non formulary 1 puff  1 puff Inhalation Daily    [DISCONTINUED] ondansetron  4 mg/2 mL inj 2 mg  2 mg Intravenous Q6H PRN    [DISCONTINUED] ondansetron  tab 4 mg  4 mg Oral Q6H PRN    [DISCONTINUED] pantoprazole  DR tab 40 mg  40 mg Oral Daily    [DISCONTINUED] pneumococcal 20-valent vaccine (Prevnar 20) inj 0.5 mL  0.5 mL Intramuscular Once    [DISCONTINUED] polyethylene glycol pwd pkt 17 g  17 g Oral BID    [DISCONTINUED] polyethylene glycol pwd pkt 17 g  17 g Oral BID    [DISCONTINUED] pyRIDostigmine  tab 60 mg  60 mg Oral Q8H    [DISCONTINUED] senna tab 1 tablet  1 tablet Oral QHS PRN    [DISCONTINUED] senna tab 1 tablet  1 tablet Oral QHS PRN    [DISCONTINUED] senna tab 1 tablet  1 tablet Oral QHS    [DISCONTINUED] spironolactone  tab 25 mg  25 mg Oral Daily    [DISCONTINUED] tiotropium bromide  2.5 mcg/act inh 2 puff  2 puff Inhalation Daily             ACC/AHA Cholesterol Management Guideline Recommendations:   Not applicable      ASCVD Risk Score    10-year ASCVD risk  N/A. Age > 75. as of 11:22 AM on 02/03/2024.   10-year ASCVD risk with optimal risk factors  cannot be calculated.   Values used to calculate ASCVD Risk Score    Age  21 y.o. Cannot calculate risk because age is not between 98 and 47 years old.   Gender  female   Race  White   HDL Cholesterol  55 mg/dL. (measured on 12/11/2022)   LDL Cholesterol  76 mg/dL. (measured on 12/11/2022)   Total Cholesterol  160 mg/dL. (measured on 12/11/2022)   Systolic Blood Pressure  110 mm Hg. (measured on 02/03/2024)   Blood Pressure Medication Present  Yes   Smoking Status  currently not a smoker   Diabetes Present  Yes     Click here for the [facility] ASCVD Cardiovascular Risk Estimator Plus tool office manager).           Assessment:     Kaitlyn LISH is 88 y.o. female with    Non-ischemic Cardiomyopathy, EF 35-40% Echo in 2023, down to 22% Echo 05/2023, 25-30% in 12/2023  - caution in using BBlockers due to myasthenia gravis  - cardiac cath with no CAD in May 2025  - discussed screening of 1st degree relatives with pt and daughter. Pt is adopted. 2 kids    Abnormal Myoview  in 2023: There is a moderate-severe perfusion defect in the distal anteroseptal wall and apex, suggestive for MI.    SOB/dyspnea. Uses oxygen concentrator  Abnormal PFTs, h/o COPD   Abnormal EKG with LBBB and PACs  Former tobacco use  Subtle calcifications of the aortic root. Moderately calcified thoracic aorta and its main branch vessels.  Intolerant to Statins  Frail and deconditioned     Plan:     Losartan  100 mg  DC'd ASA    Repatha  - discussed compliance   Farxiga   DC'd Aldactone  given high K and AKI  Seen by Dr. Myrle in EP with plans for CRT   Started low dose MTP    Orders Placed This Encounter    ECG 12-Lead Clinic Performed           Return in about 3 months (around  05/03/2024).        GDMT for Heart Failure with Reduced Ejection Fraction   Most recent LVEF: 27.5%, HFrEF present on problem list: Yes.   For the most accurate recommendations, please ensure that a LVEF is present and HFrEF is added to the problem list, if applicable      GDMT Medication Class Current Prescription Recommendation   Specified Beta Blockers^ Metoprolol  Patient has a listed allergy to beta blockers   ACEi, ARB, ARNi^^ Losartan  Patient is on ARNI (sacubitril-valsartan). Use with caution in patients with hyperkalemia and renail impairment.   MRA Spironolactone  Patient is on MRA. Use with caution in patients with hyperkalemia and renal impairment   SGLT2i Farxiga  Patient is on guideline recommended SGLT2i   ^ Specified beta blockers: metoprolol  succinate, carvedilol, bisoprolol   EVELENE ARNI (sacubitril/valsartan) preferred. Criteria for ARNI initiation: Discontinuation of ACEI for at least 36 hours, eGFR>=30, K<=5.2, no history of angioedema   Click here for ACC / AHA HFrEF GDMT recommendations         Patient Instructions   For your next clinic visit, please bring all your medication bottles for review.        Future Appointments   Date Time Provider Department Center   02/06/2024  9:20 AM Kaitlyn Kaitlyn POUR., [name] GERI IMS [address] [place]Cen   02/06/2024  2:00 PM Kaitlyn Kaitlyn POUR., [name] GERI IMS [address] [place]   03/31/2024 11:00 AM Ely Carte, [name] HEM/ONC [facility]   04/07/2024 11:00 AM SM 2020 ECHO 03 CARDIOLOGY CARDIAC IMAGING CI [id number] [facility]           Author: Donaciano Carolin, [name]

## 2024-02-03 NOTE — Patient Instructions
 For your next clinic visit, please bring all your medication bottles for review.

## 2024-02-05 ENCOUNTER — Ambulatory Visit: Payer: BLUE CROSS/BLUE SHIELD

## 2024-02-05 ENCOUNTER — Telehealth: Payer: BLUE CROSS/BLUE SHIELD

## 2024-02-05 ENCOUNTER — Telehealth: Payer: MEDICARE

## 2024-02-05 ENCOUNTER — Telehealth: Payer: PRIVATE HEALTH INSURANCE

## 2024-02-05 DIAGNOSIS — C50912 Malignant neoplasm of unspecified site of left female breast: Secondary | ICD-10-CM

## 2024-02-05 DIAGNOSIS — Z170 Estrogen receptor positive status [ER+]: Secondary | ICD-10-CM

## 2024-02-05 DIAGNOSIS — C50911 Malignant neoplasm of unspecified site of right female breast: Principal | ICD-10-CM

## 2024-02-05 NOTE — Telephone Encounter
 Call Back Request      Reason for call back:   Pt is calling requesting for [name] to submit orders for pt to get a DME for bullet sized Oxygen tank that is on wheels or on some to type of portable roller.  Pt had a visit yesterday from [employer] rep but only provided small portable tanks which does pt no good because it only pulsates air and pt needs continuous air flow.  Pt also has an oxgyen concentrator, but too big for mobile purposes.  Pt needs the tank by today as pt has an appt on 02/06/24 with [name].  Please contact pt and advise.   Any Symptoms:  []  Yes  [x]  No      If yes, what symptoms are you experiencing:    Duration of symptoms (how long):    Have you taken medication for symptoms (OTC or Rx):      If call was taken outside of clinic hours:    [] Patient or caller has been notified that this message was sent outside of normal clinic hours.     [] Patient or caller has been warm transferred to the physician's answering service. If applicable, patient or caller informed to please call us  back if symptoms progress.  Patient or caller has been notified of the turnaround time of 1-2 business day(s).  Yes

## 2024-02-05 NOTE — Telephone Encounter
 Call Back Request      Reason for call back: Dasie a pharmacist from [facility] called in requesting to confirm if the [employer] renewal request that was faxed to [phone] this morning around 9:30 AM was received. I reached out to the neuro chat and Bernardino confirmed the fax was received and would be sent to MD's coordinator. Information was relayed to [facility] and he requested a call back from the coordinator because patient's vaccine is not up to date. Please advise, thank you.     PH:  [phone]     Any Symptoms:  []  Yes  [x]  No      If yes, what symptoms are you experiencing:    Duration of symptoms (how long):    Have you taken medication for symptoms (OTC or Rx):      If call was taken outside of clinic hours:    [] Patient or caller has been notified that this message was sent outside of normal clinic hours.     [] Patient or caller has been warm transferred to the physician's answering service. If applicable, patient or caller informed to please call us  back if symptoms progress.  Patient or caller has been notified of the turnaround time of 1-2 business day(s).

## 2024-02-05 NOTE — Telephone Encounter
 Called patient and LVM. Dr Ely wants to see the patient this Friday 12/12 for f/u.  Waiting for call back.

## 2024-02-05 NOTE — Progress Notes
 OUTPATIENT GERIATRICS CLINIC NOTE    PATIENT:  [name]   MRN:  [id number]  DOB:  02-13-1936  DATE OF SERVICE:  02/06/2024  PRIMARY CARE PHYSICIAN: [name], MD    CHIEF COMPLAINT:   No chief complaint on file.       Specialists:  [facility]--Neurology      [facility]:    Chaperone status:  No data recorded    HISTORY OF PRESENT ILLNESS     [name] is a 88 y.o. female who presents today for *follow up**. Patient is accompanied by *no one**. History today is per the patient,and review of available recent records in [facility] and Care Everywhere.     Patient is a(n) [x]  reliable []  unreliable historian and additional collateral information was obtained during the visit today from:       More SOB.            Per chart review, pertinent medical history:  Past Medical History:   Diagnosis Date    Asthma     Breast cancer (HCC/RAF) 12/20/2018    CHF (congestive heart failure) (HCC/RAF)     COPD (chronic obstructive pulmonary disease) (HCC/RAF)     Diabetes mellitus (HCC/RAF)     Renal disorder      No past surgical history on file.    ALLERGIES     Allergies   Allergen Reactions    Beta Adrenergic Blockers Other (See Comments)     Avoid d/t Myasthenia Gravis  Avoid d/t Myasthenia Gravis      Levofloxacin Other (See Comments)     Flare of Myasthenia Gravis    Macrolides And Ketolides Other (See Comments)     Avoid d/t Myasthenia Gravis  Use with caution d/t Myasthenia Gravis  Use with caution d/t Myasthenia Gravis      Sulfa Antibiotics Other (See Comments)     May possibly have caused deafness in one ear  May possibly have caused deafness in one ear  other      Botulinum Toxins Other (See Comments)     Muscle weakness r/t Myasthenia Gravis  Muscle weakness r/t Myasthenia Gravis      Pollen Extract Wheezing    Statins Other (See Comments)     Muscle aches  Muscle aches  Muscle aches      Plastic Tape Itching and Other (See Comments)     Turns the skin red where applied MEDICATIONS     Personally reviewed.    No outpatient medications have been marked as taking for the 02/06/24 encounter (Appointment) with [name], MD.       SOCIAL HISTORY     Social History     Social History Narrative    Not on file        FUNCTIONAL STATUS     BADLs:  IADLs:    Ambulates with   Falls in past year:  Afraid of falling:    GERIATRIC REVIEW OF SYSTEMS     Vision:    []   glasses     []  legally blind  Hearing: []  hearing aids    Nutrition: []  normal   []  impaired  []  vegan  []  vegetarian  []  low salt  []  low-carb   Swallowing: []  impaired   Dentures:   []  yes    Depression:  []  yes   Cognition:     Incontinence: [] urine  []  fecal  []  urine and fecal  ADVANCED CARE PLANNING     Advanced directives on file: []  No  []   Yes ? Completed:     Medical DPOA on file:   []   Yes ?   []   No ? Patient designates ** * to be their surrogate medical decision-maker.         HEALTH CARE MAINTENANCE     IMMUNIZATIONS:   Immunization History   Administered Date(s) Administered    COVID-19 [employer] and up) PF, 30 mcg/0.3 mL 09/24/2023    COVID-19 [employer] Fall 2023 12y and up) PF, 30 mcg/0.3 mL 03/27/2022    COVID-19, mRNA, (Pfizer - Purple Cap) 30 mcg/0.3 mL 03/11/2019, 04/01/2019, 10/29/2019    COVID-19, mRNA, bivalent (Pfizer) 30 mcg/0.3 mL (12y and up) 11/25/2020, 07/05/2021    COVID-19, mRNA, tris-sucrose (Pfizer - [employer]) 30 mcg/0.3 mL 06/20/2020    DTaP 12/31/2015    influenza vaccine IM quadrivalent (Fluzone Quad) MDV (24 months of age and older) 11/26/2016    influenza vaccine IM quadrivalent adjuvanted (FluAD Quad) (PF) SYR (68 years of age and older) 11/05/2018, 12/01/2019, 11/25/2020    influenza vaccine IM quadrivalent high dose (Fluzone High Dose Quad) (PF) SYR (45 years of age and older) 11/22/2021    influenza vaccine IM trivalent high dose (Fluzone High Dose) (PF) SYR (60 years of age and older) 12/11/2022    influenza, unspecified formulation 05/10/2014, 12/29/2015, 11/26/2016, 04/01/2019, 04/01/2019, 12/01/2019, 12/01/2019, 12/01/2019, 12/01/2019, 11/25/2020, 11/25/2020, 01/03/2022, 12/11/2022    meningococcal conjugate 4-valent (Menveo 2-vial) IM MCV4O 01/23/2023    pneumococcal conjugate vaccine 20-valent (Prevnar 20) 07/05/2021    pneumococcal polysaccharide vaccine 23-valent (Pneumovax) 01/10/2019       Mammogram:  DXA:  PAP:  Colonoscopy:      PHYSICAL EXAM     There were no vitals taken for this visit.  Wt Readings from Last 3 Encounters:   02/03/24 132 lb (59.9 kg)   01/31/24 129 lb 6.4 oz (58.7 kg)   01/30/24 130 lb (59 kg)     84% RA!!!    System Check if normal Positive or additional negative findings   GEN  [x]  NAD     Eyes  []  Conj/Lids []  Pupils  []  Fundi   []  Sclerae []  EOM     ENT  []  External ears   []  Otoscopy   []  Gross Hearing    []   External nose   []  Nasal mucosa   []  Lips/teeth/gums    []  Oropharynx    []  Mucus membranes      Neck  [x]  Inspection/palpation    [x]  Thyroid     Resp  [x]  Effort    []  Auscultation    Distant Breath sounds   CV  [x]  Rhythm/rate   [x]  Murmurs   [x]  Edema   []  JVP non-elevated    Normal pulses:   []  Radial []  Femoral  []  Pedal  +HJR   Breast  []  Inspection []  Palpation     GI  []  Bowel sounds    [x]  Nontender   [x]  No distension    []  No rebound or guarding   []  No masses   []  Liver/spleen    []  Rectal     GU  F:  []  External []  vaginal wall         []  Cervix     []  mucus        []  Uterus    []  Adnexa   M:  []  Scrotum []  Penis         []   Prostate     Lymph  []  Cervical []  supraclavicular     []  Axillae   []  Groin/inguinal     MSK []  Gait  []  Back     Specify site examined:    []  Inspect/palp []  ROM   []  Stability []  Strength/tone []  Used arms to push up from seated to standing position   Assistive device:  []  single point cane []  quad cane  []  FWW []  rollator walker      Skin  []  Inspection []  Palpation     Neuro  []  Alert and oriented     []  CN2-12 intact grossly   []  DTR      []  Muscle strength      []  Sensation   []  Pronator drift   []  Finger to Nose/Heel to Shin   []  Romberg     Psych  []  Insight/judgement     []  Mood/affect    []  Gross cognition            LABS/STUDIES     LABS:  Lab Results   Component Value Date    WBC 6.62 02/02/2024    WBC 7.14 02/01/2024    WBC 9.48 01/31/2024    HGB 10.8 (L) 02/02/2024    HGB 11.5 (L) 02/01/2024    HGB 11.3 (L) 01/31/2024    MCV 98.5 02/02/2024    PLT 211 02/02/2024    PLT 219 02/01/2024    PLT 226 01/31/2024     Lab Results   Component Value Date    NA 138 02/02/2024    NA 140 02/01/2024    NA 139 01/31/2024    K 4.0 02/02/2024    K 4.4 02/01/2024    K 4.3 01/31/2024    CREAT 1.65 (H) 02/02/2024    CREAT 1.31 (H) 02/01/2024    CREAT 1.38 (H) 01/31/2024    GFRESTNOAA 29 06/09/2020    GFRESTNOAA 28 05/18/2020    GFRESTNOAA 30 03/23/2020    GFRESTAA 34 06/09/2020    GFRESTAA 33 05/18/2020    GFRESTAA 35 03/23/2020    CALCIUM  9.0 02/02/2024    CALCIUM  9.0 02/01/2024    CALCIUM  8.8 01/31/2024     Lab Results   Component Value Date    ALT 10 02/02/2024    ALT 8 02/01/2024    AST 18 02/02/2024    AST 19 02/01/2024    ALKPHOS 49 02/02/2024    BILITOT 0.3 02/02/2024    ALBUMIN 3.5 (L) 02/02/2024    ALBUMIN 3.4 (L) 02/01/2024     Lab Results   Component Value Date    TSH 1.6 09/24/2023    TSH 2.5 04/17/2023    TSH 1.5 12/11/2022     Lab Results   Component Value Date    HGBA1C 6.0 (H) 04/17/2023    HGBA1C 5.9 (H) 07/05/2022    HGBA1C 5.3 05/03/2022     Lab Results   Component Value Date    CHOL 160 12/11/2022    CHOL 205 07/05/2022    CHOL 226 01/07/2022     Lab Results   Component Value Date    CHOLDLQ 76 12/11/2022    CHOLDLQ 135 (H) 07/05/2022    CHOLDLQ 144 (H) 01/07/2022     Lab Results   Component Value Date    VITD25OH 76 10/18/2021     Lab Results   Component Value Date    FE 87 07/14/2019    FERRITIN 57 07/14/2019    FOLATE  9.6 11/25/2019    TIBC 356 07/14/2019     Lab Results   Component Value Date    VITAMINB12 3,994 (H) 11/25/2019    VITAMINB12 217 (L) 09/22/2019     Lab Results   Component Value Date BNP 525 (H) 01/21/2024    BNP 498 (H) 10/31/2023    BNP 355 (H) 08/19/2023     No results found for: ''PSATOTAL''      STUDIES:      EKG  Jan 21, 2024  NSR, LBBB        XR CHEST PA LAT 2V  September 22, 2019   COMPARISON: [facility] December 17, 2018     History: sob     FINDINGS:     Lungs: Clear  Heart/aorta: Normal heart size. The thoracic aorta is mildly calcified, tortuous and ectatic.  Adenopathy: None  Pleura: No effusion  Bones and Chest wall: No acute bony or body wall  findings. Osteopenia and bony maturational changes. Right upper quadrant cholecystectomy clips stable from October 2020        IMPRESSION:     No acute findings or abnormalities related to provided history.      MRI C spine  May 03, 2021  IMPRESSION:  Redemonstrated congenital cervical spinal canal stenosis with superimposed multilevel degenerative changes, which are mildly progressive compared to prior as above. No evidence of cord compression or cord signal abnormality.      Echo  Jan 2023  1. Normal left ventricular size.  2. There are LV regional wall motion abnormalities.  3. Left ventricular ejection fraction is approximately 35 to 40%.  4. Abnormal LV diastolic function.  5. There are no prior studies on this patient for comparison purposes.  ASSESSMENT and PLAN     TRINIDI TOPPINS is a 88 y.o. female who presents today for *follow up**.    #    #Acute on Chronic hypoxemic respiratory failure--oxygen dependent but markedly worse!  Suspect component of fluid overload as sx improved last week after three days of diuresis.  SX worsened more recently with superimposed productive cough with yellow sputum.  Check labs.  Check Cx.  Admit for diagnostic work up and diuresis as well as antibiotics.  Have low threshold to obtain CT Chest to better evaluate both airspace as well as pulmonary arteries--at high risk for DVT/PE given active breast cancer and use of aromatase inhibitor.  Will request EP discussion for consideration of biventricular pacer if this is felt to benefit SX in short run.  Admission discussed with admitting resident at [phone].  #Major Depression--active--sTolerating Duloxetine . Maintaining clonazepam  given severe anxiety now.  #Myesthenia Gravis--not worse. On  ULTOMIRIS . Need to clarify timing of most recent infusion.  #HFrEF--EF 20%noted on echo-- 2023--Worse.  Chronic.  Will monitor and treat underlying risk factors with medical and lifestyle interventions as warranted by balance between benefits and burdens.  Had planned Biventricular syncronized pacer 100%  and will request evaluation for this procedure while in house.  #CAD--discussed medical management alone with shared decision making model May 11, 2021.  Declines stent.  #Cervical myelopathy--monitored by neuro--chronic--re-imaged on MRI as above.  CTM.  #HTN--Increased nifedipine  CR 60 mg daily. Continue losartan  50 mg.  BP Better.    #CKD--stage 4--chronic--CTM--control BP as tolerated by symptoms of orthostasis and fatigue.  Trial off trimethoprim .  REcheck Cr now and in two weeks.  #GERD--s/p Nisan fundoplication  #COPD--mixed--per PFT's from [place] .--albuterol  prn.  May resume other inhalders.  #s/p rectocele  repair  #s/p cholecystectomy  #Immunosupressed status--medication related.  Chronic. CTM  #Nocturnal hypoxemia--presumably related to MG crisis but has history of COPD.  Will order nocturnal home O2 study and consider PFT's/Pulmonary evaluation once settled down.  May need to restart BREO inhaler.  #Myasthenia Gravis--s/p plasmapheresis x5 October 2020, no Thymoma, on Azathioprine  150 daily and mestinon  60 tid.  Saw Dr. Montell at [facility].  Was considering Soliris.  Ordering IVIG and MRI.  Encourage ongoing neurology follow up.   #IBS--titrate miralax  to comfort.    #Interstitial cystitis--stopping daily Trimethoprim  100 for UTI prophylaxis.  Using bladder instillations prn. OFF topical estrogen due to advice from neurologist re: MG.    #Breast CA--under care.  Patient continues to decline surgery.    #DM with CKD--stabe 3b--based on labs from [place] .  #OA knees--previously getting hyaluronic acid injections.  #Anxiety  #Benzodiazepine Dependence--uncomplicated--chronic.  Encourage patient to decrease intake--ideally to abstaining but will work towards this goal over time.  Not likely to achieve this given ongoing concerns re: breast ca.  #Fibromyalgia--duloxetine  as above.   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  #Statin intolerance    #Advanced directives--SNR/DNI/No feeding tubes.    FOLLOW-UP     RTC     Future Appointments   Date Time Provider Department Center   02/06/2024  9:20 AM [name], MD GERI IMS [address]/Cen   02/06/2024  2:00 PM [name], MD GERI IMS [address] [place]/Cen   02/07/2024  5:15 PM [facility] MR01 3T MRI [facility]   02/11/2024 11:40 AM Ely Carte, MD HEM/ONC [facility]   04/07/2024 11:00 AM SM 2020 ECHO 03 CARDIOLOGY CARDIAC IMAGING CI SM2020 [facility]   05/05/2024 11:00 AM Leavy Gruber, MD CRD DIS 2020 [facility]         The above plan of care, diagnosis, orders, and follow-up were discussed with the patient and/or surrogate. Questions related to this recommended plan of care were answered.    ANALYSIS OF DATA (Needs to meet 1 category for moderate and 2 categories for high LOS)     I have:     Category 1 (Needs 3 for moderate and high LOS)     []  Reviewed []  1 []  2 []  >= 3 unique laboratory, radiology, and/or diagnostic tests noted below    Test/Study:  on date .    []  Reviewed []  1 []  2 []  >= 3 prior external notes and incorporated into patient assessment    I reviewed Dr. 's note in specialty  from date .    []  Discussed management or test interpretation with external provider(s) as noted      []  Ordered []  1 []  2 []  >= 3 unique laboratory, radiology, and/or diagnostic tests noted in A&P    []  Obtained history from independent historian:       Category 2  []  Independently interpreted the test    Category 3  []  Discussed management or test interpretation with external provider(s) as noted      INTERACTION COMPLEXITY and SOCIAL DETERMINANTS of HEALTH       PROBLEM COMPLEXITY   []  New problem with uncertain diagnosis or prognosis (moderate)   []  Multiple stable chronic problems (moderate)   []  Chronic problem not stable - not controlled, symptomatic, or worsening (moderate)   []  Severe exacerbation of chronic problem (high)   []  New or chronic problem that poses threat to life or bodily function (high)     MANAGEMENT COMPLEXITY   []   Old or external/outside records reviewed   []  Discussion with alternate (proxy) if patient with impaired communication / comprehension ability (e.g., dementia, aphasia, severe hearing loss).   []  Repeated questions (or disagreement) between patient and/among caregivers/family during the visit.  []  Caregiver/patient emotions/behavior/beliefs interfering with implementation of treatment plan.  []  Independent interpretation of test (EKG, Chest XRay)   []  Discussion of case with a consultant physician     RISK LEVEL   []  Prescription drug management (moderate)   []  Minor surgery with CV risk factors or elective major surgery (moderate)   []  Dx or Rx significantly limited by SDoH (inadequate housing, living alone, poor health care access, inappropriate diet; low literacy) (moderate)   []  Major surgery - elective with CV risk factors or emergent (high)   []  Need for hospitalization (high)   []  New DNR or de-escalation of care (high)      SDoH  The diagnosis or treatment of said conditions is significantly limited by the following social determinants of health:  []  Z59.0 Homelessness  []  Z59.1 Inadequate housing  []  Z59.2 Discord with neighbors, lodgers and landlord  []  Z60.2 Problems related to living alone  []  Z59.8 Other problems related to housing and economic circumstances  []  Z59.4 Lack of adequate food and safe drinking water  []  Z59.6 Low income  []  Z59.7 Insufficient social insurance and welfare support  []  Z59.9 Problems related to housing and economic circumstances, unspecified  []  Z75.3 Unavailability and inaccessibility of health care facilities  []  Z75.4 Unavailability and inaccessibility of other helping agencies  []  Z72.4 Inappropriate diet and eating habits  []  Z62.820 Parent-biological child conflict  []  Z63.8 Other specified problems related to primary support group  []  Z55.0 Illiteracy and low level literacy   []  Z56.9 Unspecified problems related to employment        If Billing Based on Time:     I performed the following items on the day of service:    [x]  Preparing to see the patient (e.g., review of tests)  [x]  Obtaining and/or reviewing separately obtained history   [x]  Performing a medically appropriate examination and/or evaluation   [x]  Counseling and educating the patient/family/caregiver   [x]  Ordering medications, tests, or procedures  [x]  Referring and communicating with other healthcare professionals (when not separately reported)  [x]  Documenting clinical information in the EHR  []  Independently interpreting results and communicating results to patient/family/caregiver    I spent the following total amount of time on these tasks on the day of service:  New Patient     Established Patient  []  15-29 minutes - 99202    []  up to 9 minutes - 99211  []  30-44 minutes - 99203     []  10-19 minutes - 99212   []  45-59 minutes - 99204      []  20-29 minutes - 99213   []  60-74 minutes - 99205   [x]  30- minutes - 99214         []  40-55 minutes - 99215    []  I spent an additional ** * 15-minute-increment(s) for a total of * ** minutes on these tasks on the day of service. [phone] for each additional 15 minutes.)    Author: Penne LOIS Quaker, MD 02/06/2024

## 2024-02-06 ENCOUNTER — Ambulatory Visit: Payer: BLUE CROSS/BLUE SHIELD | Attending: Geriatric Medicine

## 2024-02-06 ENCOUNTER — Telehealth: Payer: MEDICARE

## 2024-02-06 ENCOUNTER — Ambulatory Visit: Payer: Commercial Managed Care - Pharmacy Benefit Manager | Attending: Geriatric Medicine

## 2024-02-06 DIAGNOSIS — R197 Diarrhea, unspecified: Principal | ICD-10-CM

## 2024-02-06 DIAGNOSIS — Z23 Encounter for immunization: Secondary | ICD-10-CM

## 2024-02-06 MED ORDER — SENNA-TIME 8.6 MG PO TABS
ORAL_TABLET | ORAL | 4 refills | 30.00000 days | Status: AC
Start: 2024-02-06 — End: ?

## 2024-02-06 NOTE — Progress Notes
 OUTPATIENT GERIATRICS CLINIC NOTE    PATIENT:  [name]   MRN:  [id number]  DOB:  1935/11/04  DATE OF SERVICE:  02/06/2024  PRIMARY CARE PHYSICIAN: [name], MD    CHIEF COMPLAINT:   Chief Complaint   Patient presents with    Heart failure with reduced ejection fraction    [facility] Discharge Follow Up       [facility] Specialists:  [place]--[place]  [place]--Neurology      Non-[facility] Specialists:    Chaperone status:  No data recorded    HISTORY OF PRESENT ILLNESS     [name] is a 88 y.o. female who presents today for *follow up**. Patient is accompanied by *no one**. History today is per the patient,and review of available recent records in [facility] and Care Everywhere.     Patient is a(n) [x]  reliable []  unreliable historian and additional collateral information was obtained during the visit today from:       BM better now that she is cleaned out.        Per chart review, pertinent medical history:  Past Medical History:   Diagnosis Date    Asthma     Breast cancer (HCC/RAF) 12/20/2018    CHF (congestive heart failure) (HCC/RAF)     COPD (chronic obstructive pulmonary disease) (HCC/RAF)     Diabetes mellitus (HCC/RAF)     Renal disorder      No past surgical history on file.    ALLERGIES     Allergies   Allergen Reactions    Beta Adrenergic Blockers Other (See Comments)     Avoid d/t Myasthenia Gravis  Avoid d/t Myasthenia Gravis      Levofloxacin Other (See Comments)     Flare of Myasthenia Gravis    Macrolides And Ketolides Other (See Comments)     Avoid d/t Myasthenia Gravis  Use with caution d/t Myasthenia Gravis  Use with caution d/t Myasthenia Gravis      Sulfa Antibiotics Other (See Comments)     May possibly have caused deafness in one ear  May possibly have caused deafness in one ear  other      Botulinum Toxins Other (See Comments)     Muscle weakness r/t Myasthenia Gravis  Muscle weakness r/t Myasthenia Gravis      Pollen Extract Wheezing    Statins Other (See Comments) Muscle aches  Muscle aches  Muscle aches      Plastic Tape Itching and Other (See Comments)     Turns the skin red where applied        MEDICATIONS     Personally reviewed.    Medications that the patient states to be currently taking   Medication Sig    ALBUTEROL  90 mcg/act inhaler INHALE 1-2 PUFF BY MOUTH EVERY 4 HOURS AS NEEDED FOR WHEEZING OR SHORTNESS OF BREATH    aspirin  81 mg EC tablet Take 1 tablet (81 mg total) by mouth daily.    azaTHIOprine  50 mg tablet Take 1.5 tablets (75 mg total) by mouth two (2) times daily.    BUPROPION , SR, 200 mg 12 hr tablet TAKE 1 TAB BY MOUTH TWICE DAILY (ANTIDEPRESSANT)    DEXILANT  60 MG DR capsule Take 1 capsule (60 mg total) by mouth daily.    DULOXETINE  20 mg DR capsule TAKE 3 CAPSULES (60 MG TOTAL) BY MOUTH DAILY    EPINEPHrine  0.3 mg/0.3 mL auto-injector INJECT 0.3ML (0.3MG ) SUBCUTANEOUSLY AS NEEDED FOR ANAPHYLAXIS (ACUTE AND  CHRONIC RESPIRATORY FAILURE WITH HYPOXIA)    exemestane  25 mg tablet Take 1 tablet (25 mg total) by mouth daily.    FARXIGA  10 MG tablet 1 TAB BY MOUTH EVERY DAY    fluticasone -vilanterol 100-25 mcg/act inhaler INHALE 1 PUFF BY MOUTH EVERY DAY (ACUTE AND CHRONIC RESPIRATORY FAILURE WITH HYPOXIA)    furosemide  20 mg tablet Take 1 tablet (20 mg total) by mouth daily.    LOSARTAN  100 mg tablet TAKE 1 TAB BY MOUTH EVERY DAY (HEART FAILURE, UNSPECIFIED)    metoprolol  succinate 25 mg 24 hr tablet Take 1 tablet (25 mg total) by mouth daily.    MONTELUKAST  10 mg tablet TAKE 1 TAB BY MOUTH EVERY EVENING (ACUTE AND CHRONIC RESPIRATORY FAYLURE WITH HYPOXIA)    polyethylene glycol powder Take 17 g (1 capful) dissolved in fluids by mouth daily. Hold for loose stool    predniSONE  10 mg tablet Take 4 tablets (40 mg total) by mouth daily for 3 days.    PYRIDOSTIGMINE  60 mg tablet TAKE 1 TAB BY MOUTH THREE TIMES DAILY (MYASTHENIA GRAVIS)    spironolactone  25 mg tablet Take 1 tablet (25 mg total) by mouth daily.    tiotropium bromide  (SPIRIVA  RESPIMAT) 1.25 mcg/act inhaler Inhale 2 puffs daily.       SOCIAL HISTORY     Social History     Social History Narrative    Not on file        FUNCTIONAL STATUS     BADLs:  IADLs:    Ambulates with   Falls in past year:  Afraid of falling:    GERIATRIC REVIEW OF SYSTEMS     Vision:    []   glasses     []  legally blind  Hearing: []  hearing aids    Nutrition: []  normal   []  impaired  []  vegan  []  vegetarian  []  low salt  []  low-carb   Swallowing: []  impaired   Dentures:   []  yes    Depression:  []  yes   Cognition:     Incontinence: [] urine  []  fecal  []  urine and fecal      ADVANCED CARE PLANNING     Advanced directives on file: []  No  []   Yes ? Completed:     Medical DPOA on file:   []   Yes ?   []   No ? Patient designates ** * to be their surrogate medical decision-maker.         HEALTH CARE MAINTENANCE     IMMUNIZATIONS:   Immunization History   Administered Date(s) Administered    COVID-19 [employer] and up) PF, 30 mcg/0.3 mL 09/24/2023    COVID-19 [employer] Fall 2023 12y and up) PF, 30 mcg/0.3 mL 03/27/2022    COVID-19, mRNA, (Pfizer - Purple Cap) 30 mcg/0.3 mL 03/11/2019, 04/01/2019, 10/29/2019    COVID-19, mRNA, bivalent (Pfizer) 30 mcg/0.3 mL (12y and up) 11/25/2020, 07/05/2021    COVID-19, mRNA, tris-sucrose (Pfizer - [employer]) 30 mcg/0.3 mL 06/20/2020    DTaP 12/31/2015    influenza vaccine IM quadrivalent (Fluzone Quad) MDV (12 months of age and older) 11/26/2016    influenza vaccine IM quadrivalent adjuvanted (FluAD Quad) (PF) SYR (62 years of age and older) 11/05/2018, 12/01/2019, 11/25/2020    influenza vaccine IM quadrivalent high dose (Fluzone High Dose Quad) (PF) SYR (55 years of age and older) 11/22/2021    influenza vaccine IM trivalent high dose (Fluzone High Dose) (PF) SYR (39 years of age and older) 12/11/2022  influenza, unspecified formulation 05/10/2014, 12/29/2015, 11/26/2016, 04/01/2019, 04/01/2019, 12/01/2019, 12/01/2019, 12/01/2019, 12/01/2019, 11/25/2020, 11/25/2020, 01/03/2022, 12/11/2022    meningococcal conjugate 4-valent (Menveo 2-vial) IM MCV4O 01/23/2023    pneumococcal conjugate vaccine 20-valent (Prevnar 20) 07/05/2021    pneumococcal polysaccharide vaccine 23-valent (Pneumovax) 01/10/2019       Mammogram:  DXA:  PAP:  Colonoscopy:      PHYSICAL EXAM     BP 135/59 (BP Location: Right arm, Patient Position: Sitting, Cuff Size: Regular)  ~ Pulse 70  ~ Temp 36.4 ?C (97.5 ?F) (Temporal)  ~ Resp 16  ~ Ht 5' 5'' (1.651 m)  ~ Wt 129 lb (58.5 kg)  ~ SpO2 99% Comment: with 2 liters 02 ~ BMI 21.47 kg/m?   Wt Readings from Last 3 Encounters:   02/06/24 129 lb (58.5 kg)   02/03/24 132 lb (59.9 kg)   01/31/24 129 lb 6.4 oz (58.7 kg)     84% RA!!!    System Check if normal Positive or additional negative findings   GEN  [x]  NAD     Eyes  []  Conj/Lids []  Pupils  []  Fundi   []  Sclerae []  EOM     ENT  []  External ears   []  Otoscopy   []  Gross Hearing    []   External nose   []  Nasal mucosa   []  Lips/teeth/gums    []  Oropharynx    []  Mucus membranes      Neck  [x]  Inspection/palpation    [x]  Thyroid     Resp  [x]  Effort    []  Auscultation    Distant Breath sounds   CV  [x]  Rhythm/rate   [x]  Murmurs   [x]  Edema   []  JVP non-elevated    Normal pulses:   []  Radial []  Femoral  []  Pedal  +HJR   Breast  []  Inspection []  Palpation     GI  []  Bowel sounds    [x]  Nontender   [x]  No distension    []  No rebound or guarding   []  No masses   []  Liver/spleen    []  Rectal     GU  F:  []  External []  vaginal wall         []  Cervix     []  mucus        []  Uterus    []  Adnexa   M:  []  Scrotum []  Penis         []  Prostate     Lymph  []  Cervical []  supraclavicular     []  Axillae   []  Groin/inguinal     MSK []  Gait  []  Back     Specify site examined:    []  Inspect/palp []  ROM   []  Stability []  Strength/tone []  Used arms to push up from seated to standing position   Assistive device:  []  single point cane []  quad cane  []  FWW []  rollator walker      Skin  []  Inspection []  Palpation     Neuro  []  Alert and oriented     []  CN2-12 intact grossly   []  DTR []  Muscle strength      []  Sensation   []  Pronator drift   []  Finger to Nose/Heel to Shin   []  Romberg     Psych  []  Insight/judgement     []  Mood/affect    []  Gross cognition            LABS/STUDIES     LABS:  Lab  Results   Component Value Date    WBC 6.62 02/02/2024    WBC 7.14 02/01/2024    WBC 9.48 01/31/2024    HGB 10.8 (L) 02/02/2024    HGB 11.5 (L) 02/01/2024    HGB 11.3 (L) 01/31/2024    MCV 98.5 02/02/2024    PLT 211 02/02/2024    PLT 219 02/01/2024    PLT 226 01/31/2024     Lab Results   Component Value Date    NA 138 02/02/2024    NA 140 02/01/2024    NA 139 01/31/2024    K 4.0 02/02/2024    K 4.4 02/01/2024    K 4.3 01/31/2024    CREAT 1.65 (H) 02/02/2024    CREAT 1.31 (H) 02/01/2024    CREAT 1.38 (H) 01/31/2024    GFRESTNOAA 29 06/09/2020    GFRESTNOAA 28 05/18/2020    GFRESTNOAA 30 03/23/2020    GFRESTAA 34 06/09/2020    GFRESTAA 33 05/18/2020    GFRESTAA 35 03/23/2020    CALCIUM  9.0 02/02/2024    CALCIUM  9.0 02/01/2024    CALCIUM  8.8 01/31/2024     Lab Results   Component Value Date    ALT 10 02/02/2024    ALT 8 02/01/2024    AST 18 02/02/2024    AST 19 02/01/2024    ALKPHOS 49 02/02/2024    BILITOT 0.3 02/02/2024    ALBUMIN 3.5 (L) 02/02/2024    ALBUMIN 3.4 (L) 02/01/2024     Lab Results   Component Value Date    TSH 1.6 09/24/2023    TSH 2.5 04/17/2023    TSH 1.5 12/11/2022     Lab Results   Component Value Date    HGBA1C 6.0 (H) 04/17/2023    HGBA1C 5.9 (H) 07/05/2022    HGBA1C 5.3 05/03/2022     Lab Results   Component Value Date    CHOL 160 12/11/2022    CHOL 205 07/05/2022    CHOL 226 01/07/2022     Lab Results   Component Value Date    CHOLDLQ 76 12/11/2022    CHOLDLQ 135 (H) 07/05/2022    CHOLDLQ 144 (H) 01/07/2022     Lab Results   Component Value Date    VITD25OH 76 10/18/2021     Lab Results   Component Value Date    FE 87 07/14/2019    FERRITIN 57 07/14/2019    FOLATE 9.6 11/25/2019    TIBC 356 07/14/2019     Lab Results   Component Value Date    VITAMINB12 3,994 (H) 11/25/2019 VITAMINB12 217 (L) 09/22/2019     Lab Results   Component Value Date    BNP 525 (H) 01/21/2024    BNP 498 (H) 10/31/2023    BNP 355 (H) 08/19/2023     No results found for: ''PSATOTAL''      STUDIES:      EKG  Jan 21, 2024  NSR, LBBB        XR CHEST PA LAT 2V  September 22, 2019   COMPARISON: KUB December 17, 2018     History: sob     FINDINGS:     Lungs: Clear  Heart/aorta: Normal heart size. The thoracic aorta is mildly calcified, tortuous and ectatic.  Adenopathy: None  Pleura: No effusion  Bones and Chest wall: No acute bony or body wall  findings. Osteopenia and bony maturational changes. Right upper quadrant cholecystectomy clips stable from October 2020  IMPRESSION:     No acute findings or abnormalities related to provided history.      MRI C spine  May 03, 2021  IMPRESSION:  Redemonstrated congenital cervical spinal canal stenosis with superimposed multilevel degenerative changes, which are mildly progressive compared to prior as above. No evidence of cord compression or cord signal abnormality.      Echo  Jan 2023  1. Normal left ventricular size.  2. There are LV regional wall motion abnormalities.  3. Left ventricular ejection fraction is approximately 35 to 40%.  4. Abnormal LV diastolic function.  5. There are no prior studies on this patient for comparison purposes.  ASSESSMENT and PLAN     [name] is a 88 y.o. female who presents today for *follow up**.    #Diarreha--was related to overflow.  Better.  Will continue bowel meds.  #Major Depression--active--sTolerating Duloxetine . Maintaining clonazepam  given severe anxiety now.  #Myesthenia Gravis--not worse. On  ULTOMIRIS . Need to clarify timing of most recent infusion.  #HFrEF--EF 20%noted on echo-- 2023--Worse.  Chronic.  Will monitor and treat underlying risk factors with medical and lifestyle interventions as warranted by balance between benefits and burdens.  Had planned Biventricular syncronized pacer 100%  and will request evaluation for this procedure while in house.  #CAD--discussed medical management alone with shared decision making model May 11, 2021.  Declines stent.  #Cervical myelopathy--monitored by neuro--chronic--re-imaged on MRI as above.  CTM.  #HTN--Increased nifedipine  CR 60 mg daily. Continue losartan  50 mg.  BP Better.    #CKD--stage 4--chronic--CTM--control BP as tolerated by symptoms of orthostasis and fatigue.  Trial off trimethoprim .  REcheck Cr now and in two weeks.  #GERD--s/p Nisan fundoplication  #COPD--mixed--per PFT's from [place] .--albuterol  prn.  May resume other inhalders.  #s/p rectocele repair  #s/p cholecystectomy  #Immunosupressed status--medication related.  Chronic. CTM  #Nocturnal hypoxemia--presumably related to MG crisis but has history of COPD.  Will order nocturnal home O2 study and consider PFT's/Pulmonary evaluation once settled down.  May need to restart BREO inhaler.  #Myasthenia Gravis--s/p plasmapheresis x5 October 2020, no Thymoma, on Azathioprine  150 daily and mestinon  60 tid.  Saw Dr. Montell at [facility].  Was considering Soliris.  Ordering IVIG and MRI.  Encourage ongoing neurology follow up.   #IBS--titrate miralax  to comfort.    #Interstitial cystitis--stopping daily Trimethoprim  100 for UTI prophylaxis.  Using bladder instillations prn. OFF topical estrogen due to advice from neurologist re: MG.    #Breast CA--under care.  Patient continues to decline surgery.    #DM with CKD--stabe 3b--based on labs from [place] .  #OA knees--previously getting hyaluronic acid injections.  #Anxiety  #Benzodiazepine Dependence--uncomplicated--chronic.  Encourage patient to decrease intake--ideally to abstaining but will work towards this goal over time.  Not likely to achieve this given ongoing concerns re: breast ca.  #Fibromyalgia--duloxetine  as above.   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  #Statin intolerance    #Advanced directives--SNR/DNI/No feeding tubes.    FOLLOW-UP     RTC Future Appointments   Date Time Provider Department Center   02/07/2024  5:15 PM [facility] MR01 3T MRI [facility]   02/11/2024 11:40 AM Ely Carte, MD HEM/ONC 230 [facility]   04/07/2024 11:00 AM SM 2020 ECHO 03 CARDIOLOGY CARDIAC IMAGING CI SM2020 [facility]   05/05/2024 11:00 AM Leavy Gruber, MD CRD DIS 2020 [facility]         The above plan of care, diagnosis, orders, and follow-up were discussed with the  patient and/or surrogate. Questions related to this recommended plan of care were answered.    ANALYSIS OF DATA (Needs to meet 1 category for moderate and 2 categories for high LOS)     I have:     Category 1 (Needs 3 for moderate and high LOS)     []  Reviewed []  1 []  2 []  >= 3 unique laboratory, radiology, and/or diagnostic tests noted below    Test/Study:  on date .    []  Reviewed []  1 []  2 []  >= 3 prior external notes and incorporated into patient assessment    I reviewed Dr. 's note in specialty  from date .    []  Discussed management or test interpretation with external provider(s) as noted      []  Ordered []  1 []  2 []  >= 3 unique laboratory, radiology, and/or diagnostic tests noted in A&P    []  Obtained history from independent historian:       Category 2  []  Independently interpreted the test    Category 3  []  Discussed management or test interpretation with external provider(s) as noted      INTERACTION COMPLEXITY and SOCIAL DETERMINANTS of HEALTH       PROBLEM COMPLEXITY   []  New problem with uncertain diagnosis or prognosis (moderate)   []  Multiple stable chronic problems (moderate)   []  Chronic problem not stable - not controlled, symptomatic, or worsening (moderate)   []  Severe exacerbation of chronic problem (high)   []  New or chronic problem that poses threat to life or bodily function (high)     MANAGEMENT COMPLEXITY   []  Old or external/outside records reviewed   []  Discussion with alternate (proxy) if patient with impaired communication / comprehension ability (e.g., dementia, aphasia, severe hearing loss).   []  Repeated questions (or disagreement) between patient and/among caregivers/family during the visit.  []  Caregiver/patient emotions/behavior/beliefs interfering with implementation of treatment plan.  []  Independent interpretation of test (EKG, Chest XRay)   []  Discussion of case with a consultant physician     RISK LEVEL   []  Prescription drug management (moderate)   []  Minor surgery with CV risk factors or elective major surgery (moderate)   []  Dx or Rx significantly limited by SDoH (inadequate housing, living alone, poor health care access, inappropriate diet; low literacy) (moderate)   []  Major surgery - elective with CV risk factors or emergent (high)   []  Need for hospitalization (high)   []  New DNR or de-escalation of care (high)      SDoH  The diagnosis or treatment of said conditions is significantly limited by the following social determinants of health:  []  Z59.0 Homelessness  []  Z59.1 Inadequate housing  []  Z59.2 Discord with neighbors, lodgers and landlord  []  Z60.2 Problems related to living alone  []  Z59.8 Other problems related to housing and economic circumstances  []  Z59.4 Lack of adequate food and safe drinking water  []  Z59.6 Low income  []  Z59.7 Insufficient social insurance and welfare support  []  Z59.9 Problems related to housing and economic circumstances, unspecified  []  Z75.3 Unavailability and inaccessibility of health care facilities  []  Z75.4 Unavailability and inaccessibility of other helping agencies  []  Z72.4 Inappropriate diet and eating habits  []  Z62.820 Parent-biological child conflict  []  Z63.8 Other specified problems related to primary support group  []  Z55.0 Illiteracy and low level literacy   []  Z56.9 Unspecified problems related to employment        If Billing Based on Time:  I performed the following items on the day of service:    [x]  Preparing to see the patient (e.g., review of tests)  [x]  Obtaining and/or reviewing separately obtained history [x]  Performing a medically appropriate examination and/or evaluation   [x]  Counseling and educating the patient/family/caregiver   [x]  Ordering medications, tests, or procedures  [x]  Referring and communicating with other healthcare professionals (when not separately reported)  [x]  Documenting clinical information in the EHR  []  Independently interpreting results and communicating results to patient/family/caregiver    I spent the following total amount of time on these tasks on the day of service:  New Patient     Established Patient  []  15-29 minutes - 99202    []  up to 9 minutes - 99211  []  30-44 minutes - 99203     []  10-19 minutes - 99212   []  45-59 minutes - 99204      []  20-29 minutes - 99213   []  60-74 minutes - 99205   [x]  30- minutes - 99214         []  40-55 minutes - 99215    []  I spent an additional ** * 15-minute-increment(s) for a total of * ** minutes on these tasks on the day of service. [phone] for each additional 15 minutes.)    Author: Penne LOIS Quaker, MD 02/06/2024    Needs infusion extension

## 2024-02-06 NOTE — Telephone Encounter
 Placed call to [facility]  I relayed pt message below and asked what codes are needed for patient to receive preferred DME.     Staff advised most likely patient is requesting a high flow concentrator as this is something she received in the past.     Asked to please send an order   [phone] for a Portable Gaseous Oxygen System  [phone]  an Oxygen Concentrator, single delivery port, capable of delivering 85% or greater oxygen concentration at the prescribed flow rate (if it applies to patient)

## 2024-02-06 NOTE — Telephone Encounter
-----   Message from Penne LOIS Quaker, MD sent at 02/06/2024  2:04 PM PST -----  Please fax orders to [facility]---bupropion  should be 200 mg bid  Electronically signed  Penne Quaker, MD

## 2024-02-06 NOTE — Telephone Encounter
 Faxed MD order to      [facility]   Phone- [phone]  Fax-[phone]

## 2024-02-06 NOTE — Telephone Encounter
 A signed renew order has been sent to [facility]. The are assisting with the renew of this patient order.

## 2024-02-06 NOTE — Telephone Encounter
 Call Back Request      Reason for call back: Dasie with [facility] is calling to get an update on Ultomiris renewal request, states he also needs the vaccine record, Please assist, thank you     CB [phone]    Any Symptoms:  []  Yes  [x]  No      If yes, what symptoms are you experiencing:    Duration of symptoms (how long):    Have you taken medication for symptoms (OTC or Rx):      If call was taken outside of clinic hours:    [] Patient or caller has been notified that this message was sent outside of normal clinic hours.     [] Patient or caller has been warm transferred to the physician's answering service. If applicable, patient or caller informed to please call us  back if symptoms progress.  Patient or caller has been notified of the turnaround time of 1-2 business day(s).

## 2024-02-07 ENCOUNTER — Ambulatory Visit: Payer: PRIVATE HEALTH INSURANCE

## 2024-02-07 DIAGNOSIS — C50912 Malignant neoplasm of unspecified site of left female breast: Secondary | ICD-10-CM

## 2024-02-07 DIAGNOSIS — M898X5 Other specified disorders of bone, thigh: Secondary | ICD-10-CM

## 2024-02-07 DIAGNOSIS — Z170 Estrogen receptor positive status [ER+]: Principal | ICD-10-CM

## 2024-02-07 DIAGNOSIS — C50911 Malignant neoplasm of unspecified site of right female breast: Secondary | ICD-10-CM

## 2024-02-07 DIAGNOSIS — M810 Age-related osteoporosis without current pathological fracture: Secondary | ICD-10-CM

## 2024-02-07 NOTE — Patient Instructions
 Please use the MyChart message system to communicate non-urgent quick questions or updates. Messages are not checked after hours, on weekends or holidays and are typically answered in 24-48 business hours. Anything more than 1-2 sentences usually suggests that a visit is needed to fully evaluate the issue and also to make sure I have the time reserved to devote adequate attention.  For any urgent questions or questions that are more than very brief, please contact the office or use the MyChart system to schedule a phone, video, or in-person visit. Thank you!

## 2024-02-07 NOTE — Progress Notes
 HEMATOLOGY/ONCOLOGY OUTPATIENT NOTE    PATIENT: [name]  MRN: [id number]  DOB: 10/25/1935  DATE OF SERVICE: 02/11/2024  Referring MD: [name], MD    Reason for Consultation/ Chief Complaint:  Bilateral breast cancer    HPI   SYBLE PICCO is a 88 y.o. female with PMH including myasthenia gravis, asymmetric hearing loss, fibromyalgia, DM with CKD, major depression, interstitial cystitis, IBS, COPD who presents to establish care for breast cancer.     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 07/19/2017 Screening detected right breast calcifications and distortion in the upper outer quadrant: Biopsy DCIS+ ALH +CSL; 4 cm apart, axilla negative.    Neoadjuvant tamoxifen started 10/30/17, stopped after 4-6 weeks due to dry mouth and mouth sores. Switched to anastrozole  01/29/18, discontinued because it made her throat dry. Declined additional antiestrogen therapy.   Moved on October of 2020 to [place]      Ms. Gulick then noted left breast pain in April 2022.     Underwent a diagnostic bilateral breast imaging 06/13/2020 which revealed:    - Right breast: solid mass with indistinct margins measuring 14 x 4 x 10 mm in the right breast at 1 o'clock located 6 centimeters from the nipple with associated microclip,  not parallel solid mass with irregular margins measuring 8 x 4 x 5 mm seen in the right breast at 12 o'clock, upper outer quadrant located 6 centimeters from the nipple.    - Left breast: irregular mass with angular margins measuring 10 x 9 x 9 mm seen in the left breast at 11 o'clock located 5 centimeters from the nipple, axillary lymph node in the left axilla with cortical thickness measuring up to 6 mm.    R breast biopsy at 12:00, 6 cm from nipple on 07/13/2020 revealed invasive ductal carcinoma with lobular and focal cribriform features, grade 2 ER (+) PR (+) HER2 (-), Ki-67 5%    L breast biopsy at 11:00, 5 cm from nipple on 07/13/2020 revealed invasive ductal carcinoma with lobular features, grade 2 ER (+) PR (-) HER2 (-), Ki-67 10%     Breast MRI performed on 08/05/2020 that redemonstrated multifocal right breast malignancy and left breast IDC with an additional 4.3cm of NME that spanned nearly the entire left breast that appears suspicious.     [facility] genetic testing (-) for pathogenic variants.     08/17/2020 Patient presents to establish care for breast cancer. She has seen Dr. Sebastian to discuss surgical treatment, and is quite worried about anesthesia with her myasthenia gravis . She is interested in neoadjuvant therapy     09/13/2020 Patient presents for follow-up of breast cancer. PETCT on 08/22/2020 with no evidence of FDG avid metastatic disease. DXA scan on 08/26/2020 demonstrated osteopenia of the left femoral neck and total hip.     10/28/2020 Patient presents for follow-up of breast cancer. Labs on 09/13/2020 showed WBC 5.3, hgb 11.4, platelets 300, ANC 3.58. Endorses fatigue, frequent diarrhea. BM anytime she eats, usually about 3 times a day. Diarrhea is more frequent in the morning. Also reports hot flashes. Breasts are tender, using a larger bra size.     11/25/2020 Patient presents for follow-up of breast cancer. Labs on 9/2/202 showed WBC 6.7, hgb 12.0, platelets 384,  ANC 4.80. Breast MRI on 11/18/2020 with stable to slightly decreased size of known biopsy proven malignancies in the right breast, decreased size of the irregular mass  with microclip at 11:00, 5 cm from the nipple, consistent with known malignancy, nonmass enhancement spanning 40 mm has also slightly decreased in size and now demonstrates subthreshold kinetics, this remains suspicious, previously described oval enhancing mass at 4:00 is no longer identified.     01/03/2021 Patient presents for follow-up of breast cancer. Labs on 12/20/2020 showed WBC 6.4, hgb 9.7, platelets 278, ANC 5.00.     Hospital admission 10/23-10/25/2022. P/w atypical chest pain x 2-3 days with new LBBB but negative troponin and relief w/ antacid medications most c/f upper GI pathology such as severe GERD, PUD, or gastritis. CT AP without acute abnormality. Initiated pantoprazole  40 mg PO bid with improvement in symptoms with PPI and GI cocktail w/maalox. Patient able to tolerate all meals without recurrent chest pain. GI was consulted and given improvement in symptoms, EGD deferred. Continue Maalox q4h PRN. H pylori stool Ag discontinued given long term PPI use. Continue dexelant outpatient. Ondansetron  4 mg PO q6hr PRN Rx for nausea. Outpatient GI follow-up.     10/18/2021 Patient presents for follow-up of breast cancer. Labs on 09/01/2021 showed WBC 4.94, hgb 11.8, platelets 342. Breast MRI 05/03/2021 with Interval decrease in size of known malignancies in the right breast with trace residual enhancement, Grossly stable irregular mass at 11 o'clock corresponding to biopsy-proven malignancy,  Interval resolution of previously described regional nonmass enhancement in the upper inner, upper outer, and lower outer quadrants. She admits to persistent diarrhea associated with significant weight loss. She often does not eat during the day because she is fearful of leaving the house d/t diarrhea. She does not take much imodium . The diarrhea is dependent on what she eats. Has longstanding hx of IBS. Diarrhea is improved when she takes dexilant  but was unable to obtain the dexilant  in the preferred form for a while. She denies B symptoms. Admits to occasional vomiting associated with the diarrhea. Continues with IVIG for myasthenia gravis. Has decided she would not like to pursue breast surgery due to age, co morbidities, anaesthetic risks.     11/14/2021 Patient presents for follow-up of breast cancer. Labs on 10/18/2021 showed WBC 4.7, hgb 12.7, platelets 379, ANC 3.81, CEA 5.00. CT chest 11/13/2021 (-). Met with Dr. Annabelle today. Held Arimidex  from last visit until yesterday. Diarrhea resolved while she held the Anastrazole. She actually became constipated.     01/11/2022 Patient presents for follow up of breast cancer. MR abd/pelv 11/23/2021 (-). Labs on 01/03/2022 showed WBC 5.37, hgb 12.8, PLT 339. Switched to exemestane  last visit    05/22/2022 Patient presents for follow up of bilateral breast cancer. Labs on 03/27/2022 showed WBC 5.89, hgb 12.8, platelets 358. Reports diarrhea has resolved since switching to exemestane     12/25/2022 Patient presents for follow up of bilateral breast cancer. Diagnostic mammo/US  10/17/2022 stable.     07/24/2023 Patient presents for follow up of bilateral breast cancer. Diagnostic mammo/US  04/26/2023 stable. Recently moved to [place] (living facility). S/p angiogram 5/22 with Dr. Alyn. Lasix  on hold     02/11/24: Patient presents for follow up of bilateral breast cancer here with daughter. She's had multiple hospitalizations since the last visit, for COPD exacerbation 01/21/24 and more recently for diarrhea. A CT scan in 10/2023 and again on 01/30/24 showed 15 mm lucent lesion in the left acetabulum and is again seen, which may represent a subchondral cyst, but given history of breast cancer may be concerning for a new lytic lesion. No hip pain at this time.  ECOG performance status: 0  PAST HISTORY   PMH:  Patient Active Problem List    Diagnosis Date Noted    Osteoporosis 02/07/2024    Lytic bone lesion of hip 02/07/2024    Hyperkalemia 07/25/2023    Epiretinal membrane, bilateral 05/18/2022     Not visually significant  Monitor      PVD (posterior vitreous detachment), bilateral 05/18/2022     No evidence of lesions predisposing to retinal detachment. Retinal detachment precautions reviewed with patient x3.    Discussed warning signs of retinal tear and retinal detachment: If patient experiences worsening flashes, worsening floaters, a fixed-visual field deficit, or any deterioration of vision contact the office immediately even after regular hours.       Chorioretinal scars, bilateral 05/18/2022     Stable  Monitor      Heart failure with reduced ejection fraction LVEF <=40% (HCC/RAF) 11/25/2021    Atypical chest pain 12/20/2020    Acute chest pain 12/18/2020    Immunosuppressed status 03/21/2020    HTN (hypertension) 10/31/2019    Aortic calcification 09/25/2019    Refractive error 04/28/2019     07/03/2022 - Dispensed spectacle prescription for distance and optional near.  Dispensed retaine for visual stability, particularly when eyes are fatigued      Ptosis of eyelid 04/28/2019    Pseudophakia of both eyes 04/28/2019     Patient endorses history of movement during CEIOL with atypical lens placment right eye. Lens appears stable and centered right eye, slightly superior displaced but stable right eye. Monitor      Benzodiazepine dependence (HCC/RAF) 12/20/2018    Breast cancer (HCC/RAF) 12/20/2018    Spondylosis without myelopathy or radiculopathy, lumbar region 11/28/2018    Anxiety 11/27/2018    CKD (chronic kidney disease), stage III (HCC/RAF) 11/27/2018    Nocturnal hypoxemia 04/07/2018     Last Assessment & Plan:   We discussed potential impact of hypoxemia from her COPD on other body function  Plan-overnight oximetry      Major depression, recurrent, chronic 12/16/2017    Diabetes mellitus (HCC/RAF) 12/16/2017    Osteoarthritis of knee 05/17/2017    H/O arthroscopy 03/06/2017    Myasthenia gravis with exacerbation (HCC/RAF) 12/18/2016     05/18/2022  Stable with treatment, no double vision. Referral to optom for glasses. All risks, benefits, and alternatives discussed with patient. Follow up when needed      Vaginal atrophy 08/16/2016    Acute medial meniscal tear, left, subsequent encounter 07/18/2016    Renal cyst 03/12/2016    Asthmatic bronchitis, mild persistent, with acute exacerbation 12/29/2015     Last Assessment & Plan:   Continue rescue inhaler as needed. Try adding sample Bevespi maintenance inhaler to see if this helps.  She is currently on prednisone  20 mg daily maintenance and I don't think adding a steroid inhaler component will make any difference.      Urethral caruncle 07/26/2015    Chronic cholecystitis 01/14/2015    Angioedema 05/23/2014     Attributed to injected methylprednisolone    Last Assessment & Plan:   We discussed limited ability to test for possible triggers. We are asking our labs sources about ability to test against methylprednisolone for injection.  A different brand might be worth trying. She can try pre-medicating with an antihistamine.      Chronic interstitial cystitis 10/01/2011    Insomnia 05/31/2007     Qualifier: Diagnosis of   By: Neysa MD, Reggy BIRCH    Last  Assessment & Plan:   I'm concerned that some of her sleep problems may reflect obstructive sleep apnea based on her palate and which she can tell me about her sleep patterns without a witness. We also want to know about oxygenation during sleep.  Plan-schedule sleep study  Qualifier: Diagnosis of   By: Neysa MD, Clinton D      Last Assessment & Plan:   I'm concerned that some of her sleep problems may reflect obstructive sleep apnea based on her palate and which she can tell me about her sleep patterns without a witness. We also want to know about oxygenation during sleep.  Plan-schedule sleep study      COPD mixed type (HCC/RAF) 04/30/2007     Office Spirometry 03/16/2016-severe obstructive airways disease FVC 1.59/59%, FEV1 0.98/49%, ratio 0.62, FEF 25-75% 0.42/28%  PFT 04/16/16-severe obstructive airways disease with slight response to bronchodilator, severe diffusion defect. FEV1/FVC 0.64, DLCO 45%    Last Assessment & Plan:   Severe obstructive airways disease. She can't hear herself wheeze.  Plan-try samples of Trelegy instead of Anoro for comparison    Overview:   Office Spirometry 03/16/2016-severe obstructive airways disease FVC 1.59/59%, FEV1 0.98/49%, ratio 0.62, FEF 25-75% 0.42/28%  PFT 04/16/16-severe obstructive airways disease with slight response to bronchodilator, severe diffusion defect. FEV1/FVC 0.64, DLCO 45%    Last Assessment & Plan:   Severe obstructive airways disease. She can't hear herself wheeze.  Plan-try samples of Trelegy instead of Anoro for comparison  Office Spirometry 03/16/2016-severe obstructive airways disease FVC 1.59/59%, FEV1 0.98/49%, ratio 0.62, FEF 25-75% 0.42/28%  PFT 04/16/16-severe obstructive airways disease with slight response to bronchodilator, severe diffusion defect. FEV1/FVC 0.64, DLCO 45%    Last Assessment & Plan:   Severe COPD but mainly emphysema with limited bronchospasm component that could be addressed with bronchodilators.  Plan-try sample Bevespi as a maintenance controller, refill pro-air      Esophageal reflux 04/30/2007     History of fundoplication     Last Assessment & Plan:   Emphasized continued attention to antireflux measures. Reminded that reflux can be a significant trigger for irritable airways, cough and wheeze.  History of fundoplication       Last Assessment & Plan:   Emphasized continued attention to antireflux measures. Reminded that reflux can be a significant trigger for irritable airways, cough and wheeze.      Eustachian tube dysfunction 04/30/2007     Annotation: decreased hearing  Qualifier: Diagnosis of   By: Joshua CNA/MA, Jessica       Last Assessment & Plan:   She is nearly deaf on a chronic basis. Management is mostly by her ENT physicians.      Fibromyalgia 04/30/2007     Qualifier: Diagnosis of   By: Joshua CNA/MA, Harlene Dan: Diagnosis of   By: Joshua CNA/MA, Jessica      Seasonal and perennial allergic rhinitis 04/30/2007     Qualifier: Diagnosis of   By: Joshua CNA/MA, Jessica     Last Assessment & Plan:   She is now on prednisone  20 mg daily. I suggested she wait until her myasthenia status is stabilized. Then if she needs to she couldn't seek evaluation at one of the allergy practices in town as discussed, since our vaccine program will be closing.  Qualifier: Diagnosis of   By: Joshua CNA/MA, Jessica       Last Assessment & Plan:   She is now on prednisone  20 mg daily. I suggested she wait until  her myasthenia status is stabilized. Then if she needs to she couldn't seek evaluation at one of the allergy practices in town as discussed, since our vaccine program will be closing.     Tobacco: smoked  For 30 years     Surgical History:  No past surgical history on file.    Gyn Hx:  Menses 15   G2P2- first birth 72  Menopause :50's   Menopausal status: Postmenopausal  Hormone therapy use: None      Family and Social History  No family history on file.  Social History     Tobacco Use    Smoking status: Former     Current packs/day: 0.00     Types: Cigarettes     Quit date: 1985     Years since quitting: 40.9    Smokeless tobacco: Former   Substance Use Topics    Alcohol use: Never       MEDS     No outpatient medications have been marked as taking for the 02/11/24 encounter (Office Visit) with Ely Carte, MD.     Allergies   Allergen Reactions    Beta Adrenergic Blockers Other (See Comments)     Avoid d/t Myasthenia Gravis  Avoid d/t Myasthenia Gravis      Levofloxacin Other (See Comments)     Flare of Myasthenia Gravis    Macrolides And Ketolides Other (See Comments)     Avoid d/t Myasthenia Gravis  Use with caution d/t Myasthenia Gravis  Use with caution d/t Myasthenia Gravis      Sulfa Antibiotics Other (See Comments)     May possibly have caused deafness in one ear  May possibly have caused deafness in one ear  other      Botulinum Toxins Other (See Comments)     Muscle weakness r/t Myasthenia Gravis  Muscle weakness r/t Myasthenia Gravis      Pollen Extract Wheezing    Statins Other (See Comments)     Muscle aches  Muscle aches  Muscle aches      Plastic Tape Itching and Other (See Comments)     Turns the skin red where applied       PHYSICAL EXAM     Last Recorded Vital Signs:    02/11/24 1203   BP: 127/77   Pulse: 93   Resp: 18   Temp: 36.4 ?C (97.5 ?F)   SpO2: 98%            Body mass index is 21.13 kg/m?SABRA  Wt Readings from Last 3 Encounters:   02/11/24 57.6 kg (127 lb)   02/06/24 58.5 kg (129 lb)   02/03/24 59.9 kg (132 lb)       Review of Systems:  Pertinent items are noted in HPI.      Physical Exam:  General: No acute distress. Appears well-developed, well-nourished and close to stated age.   Head: Normocephalic, atraumatic.  Eyes: Sclera anicteric. EOMI.  ENT: Hearing grossly normal bilaterally. Oropharynx is clear, mucus membranes are moist.  No oral ulcers noted. Good dentition.  Neck: Supple. Trachea midline.   Cardiac: Regular rate and rhythm. Normal S1, S2. No murmurs, rubs, or gallops.  Respiratory: Respiratory effort appears normal.   Extremities: Warm. No edema. No cyanosis.  Neurologic: Gait appears normal. Sensation intact to light touch in all four extremities. Oriented to person, place and time.   Hematologic: No bruising, purpura or petechiae are noted.   Dermatologic: Skin intact.  No rashes appreciated.  Psychiatric: Affect appropriate.  Pleasant and conversant.     Breast exam: +mass in the left breast, 11:00 position, 5 cmfn measuring 2 x 2 cm      Recent Labs:  Lab Results   Component Value Date    WBC 7.50 02/11/2024    HGB 13.8 02/11/2024    HCT 44.3 02/11/2024    MCV 101.8 (H) 02/11/2024    PLT 344 02/11/2024      Lab Results   Component Value Date    NA 141 02/11/2024    K 4.68 02/11/2024    CL 103.1 02/11/2024    CO2 30.0 02/11/2024    CREAT 2.2 (H) 02/11/2024    BUN 33.1 (H) 02/11/2024     Lab Results   Component Value Date    ALT 8 02/11/2024    AST 14 02/11/2024    ALKPHOS 65 02/11/2024    BILITOT 0.36 02/11/2024     Lab Results   Component Value Date    CALCIUM  9.9 02/11/2024    PHOS 3.6 02/02/2024       Pertinent imaging:  04/26/2023 Diagnostic mammo/US   IMPRESSION:  Finding 1:  Stable solid masses in the right breast at 1 o'clock and 12 o'clock are known  malignancies. Surgical and oncologic consultation is ongoing. Clinical management of known  malignancy is recommended.  Finding 2:  Stable complicated cyst versus benign appearing solid mass in the right breast at 12  o'clock is benign.  Finding 3:  Microclip in the upper outer quadrant of the right breast at 10 o'clock located 2  centimeters from the nipple corresponds to known high risk lesion. Surgical consultation continues  to be recommended.  Finding 4:  Irregular mass in the left breast at 11 o'clock is a known malignancy. This has both  mammographically and sonographically slightly increased in size from 09/2022. Surgical and oncologic  consultation is ongoing. Clinical management of known malignancy is recommended.  BI-RADS Category 6:  Known Biopsy-Proven Malignancy     10/18/2022 Diagnostic mammogram/US   IMPRESSION:  Finding 1:  Solid mass in the right breast at 1 o'clock and 12 o'clock are known malignancies.  Surgical and oncologic consultation is ongoing. Clinical management of known malignancy is  recommended.  Finding 2:  Microclip in the right breast at 10 o'clock located 2 centimeters from the nipple  corresponds to known high risk lesion. Surgical consultation is ongoing.  Finding 3:  Complicated cyst versus benign appearing solid mass in the right breast at 12 o'clock is  probably benign. Sonographic follow-up in 6 months is recommended.  Finding 4:  Irregular mass in the left breast at 11 o'clock is a known malignancy. This has  mammographically increased in size from 07/13/2020 and sonographically slightly increased in size  from 01/26/2022. Surgical and oncologic consultation is ongoing. Clinical management of known  malignancy is recommended.  Finding 5:  Vague hypoechoic area in the left breast at 12 o'clock is stable from 06/13/2020 and is  benign.  Finding 6:  Complicated cyst versus benign appearing solid mass in the left breast at 1 o'clock is  stable from 06/13/2020 and is benign.  BI-RADS Category 6:  Known Biopsy Proven Malignancy    01/26/2022 Mammo/US  diagnostic, bilat  IMPRESSION:  1. Biopsy proven malignancies of right mass at 1:00 and 12:00 both at 6 cm FN (X and ribbon clip)  have decreased in size sonographically. Right breast area with coil microclip stable in appearance  on mammogram. Appropriate action should be  taken based on pathology results.  2. Increasing calcifications right breast compared to prior mammogram. Unclear if this is  progression of disease as prior MRI demonstrates trace residual enhancement. If breast conservation  surgery is of interest, stereotactic biopsy may be performed.  3. RIght breast probably benign mass at 12:00 6 cm FN stable, recommend follow up US  in 6 months.  4. L breast 11:00 5 cm FN biopsy proven malignancy has slightly increased in size sonographically  compared to prior ultrasound.  5. Probably benign masses of left breast at 12:00 3 cm FN and 1:00 1 cm FN are stable, recommend  follow up US  in 6 months.  6. Stable thickened left axillary lymph node, continued surgical/oncologic consultation is  recommended.    11/13/2021 CT chest wo  History of bilateral breast cancer without definite metastatic disease in the thorax     05/03/2021 MR breast  1. Right breast:   *  Interval decrease in size of known malignancies in the right breast with trace residual enhancement. Recommend continued surgical and oncological management. BI-RADS CATEGORY 6 - KNOWN BIOPSY-PROVEN MALIGNANCY.      2. Left breast:  *  Grossly stable irregular mass at 11 o'clock corresponding to biopsy-proven malignancy. Recommend continued surgical and oncological management. BI-RADS CATEGORY 6 - KNOWN BIOPSY-PROVEN MALIGNANCY.   *  Interval resolution of previously described regional nonmass enhancement in the upper inner, upper outer, and lower outer quadrants. Recommend continued surgical management. BI-RADS CATEGORY 4 - SUSPICIOUS.      3. Additional findings:  *  STIR hyperintense fluid collection in the right glenohumeral joint/subacromial region may represent bursitis. Correlate with symptoms/physical exam.     OVERALL ASSESSMENT: BI-RADS CATEGORY 4 - SUSPICIOUS.    05/03/2021 MR cervical spine  Redemonstrated congenital cervical spinal canal stenosis with superimposed multilevel degenerative changes, which are mildly progressive compared to prior as above. No evidence of cord compression or cord signal abnormality.     03/14/2021 NM stress test  1) There is a moderate-severe, fixed perfusion defect in the distal anteroseptal wall and apex suggestive for a prior MI.   No scintigraphic evidence for stress-induced ischemia.    2) Normal left ventricular size and reduced global LV systolic function, EF 36%. There are regional wall motion abnormalities present as indicated above.  3) For physiologic and electrocardiographic findings, please refer to a separate report.  4) There are no prior studies available for comparison.      12/18/2020 CT abd/pelvis wo contrast  1.  No acute CT abnormality in the abdomen or pelvis.  2.  Moderate hiatal hernia with postsurgical changes likely related to reported prior Nissen fundoplication.  3.  Left-sided diverticulosis without evidence diverticulitis.  4.  History of bilateral breast cancer without evidence of metastatic disease within the abdomen or pelvis.    12/18/2020 CTA chest   1.  No acute aortic process. No acute process within the chest.  2.  History of bilateral breast cancer without evidence of intrathoracic metastatic disease.  3.  Additional findings as above.    11/18/2020 MRI breast bilat  1. Right breast:   *  Stable to slightly decreased size of known biopsy proven malignancies in the right breast. BI-RADS CATEGORY 6 - KNOWN BIOPSY-PROVEN MALIGNANCY. Recommend continued surgical and oncologic management.      2. Left breast:  *  Decreased size of the irregular mass with microclip at 11:00, 5 cm from the nipple, consistent with known malignancy. BI-RADS CATEGORY 6 - KNOWN  BIOPSY-PROVEN MALIGNANCY. Recommend continued surgical and oncologic management.   *  Nonmass enhancement spanning 40 mm has also slightly decreased in size and now demonstrates subthreshold kinetics. This remains suspicious. If breast conservation is desired, an MRI guided biopsy is recommended. BI-RADS CATEGORY 4 - SUSPICIOUS.  *  Previously described oval enhancing mass at 4:00 is no longer identified. BI-RADS CATEGORY 2.     OVERALL ASSESSMENT: BI-RADS CATEGORY 4 - SUSPICIOUS.    08/26/2020 DXA lumbar spine/hip  -----------------------------------------------------------------   Region                   BMD    T-score  Z-score   Classification   -----------------------------------------------------------------   AP Spine(L1-L4)          1.062    0.1      2.9       Normal   Femoral Neck (Left)      0.723   -1.1      1.3       Osteopenia   Total Hip (Left)         0.725   -1.8      0.4       Osteopenia   -----------------------------------------------------------------   Impression: The BMD for  the AP Spine(L1-L4) decreased, changing by -2.4% since the last DXA exam.     08/22/2020 PETCT  1.  Mild to moderate left greater than right FDG activity associated with small soft tissue densities in the bilateral breasts in the regions marked by breast biopsy clips.   2.  No evidence of FDG avid metastatic disease.    06/19/2019 US  pelvis  Small subserosal uterine fundal fibroid. Otherwise normal postmenopausal pelvic ultrasound.        Pertinent pathology:  03/21/2022 Tissue exam  FINAL DIAGNOSIS  BREAST, RIGHT, CALCIFICATIONS, 10:00, 2 CM FROM NIPPLE (MAMMO-GUIDED CORE  BIOPSY):  - Atypical ductal hyperplasia (ADH)  - Background breast with fibrosis, microcysts, and apocrine metaplasia  - Calcifications are present within ADH, apocrine metaplasia, and benign stroma  - No carcinoma identified (multiple deeper levels examined     08/02/2020 [facility]      07/13/2020 Tissue exam  A. BREAST, RIGHT, MASS, 12:00, 6 CM FROM NIPPLE (NEEDLE CORE BIOPSY):  - Invasive ductal carcinoma with lobular and focal cribriform features, grade 2 (50% of biopsy; longest segment 4 mm)             Modified Bloom and Richardson score: 6 of 9                        Tubule formation:                3                        Nuclear pleomorphism:       2                        Mitotic score:                       1  - Ductal carcinoma in situ (DCIS), low nuclear grade, cribriform type with focal central necrosis  - Breast biomarkers: See below  - ERBB2 (HER2) FISH: Negative for HER2 gene amplification      B.  BREAST, LEFT, MASS, 11:00, 5 CM FROM NIPPLE (NEEDLE CORE BIOPSY):  - Invasive  ductal carcinoma with lobular features, grade 2 (50% of biopsy; longest involved segment 6 mm)             Modified Bloom and Richardson score: 6 of 9                        Tubule formation:                3                        Nuclear pleomorphism:       2                        Mitotic score:                       1  - Ductal carcinoma in situ (DCIS) is not identified  - Breast biomarkers: See immunohistochemistry report below  - ERBB2 (HER2) FISH: Negative [based on IHC (2+) and FISH, see comment].     COMMENT: It is uncertain whether patients with an average of >4.0 and <6.0 HER2 signals per cell and a HER2/CEP17 ratio of <2.0 benefit from HER2-targeted therapy in the absence of protein overexpression (IHC 3+). If the specimen test result is close to the FISH ratio threshold for positive, there is a higher likelihood that repeat testing will result in different results by chance alone. Therefore, when IHC results are not 3+ positive, it is recommended that the sample be considered negative without additional testing on the specimen.    BREAST BIOMARKER REPORT     BLOCK:  A1  FIXATIVE:  Formalin      RESULT ER/PR:    ESTROGEN   RECEPTOR PROGESTERONE RECEPTOR   Antibody Clone SP1 Clone 636   %Tumor Staining >95% >95%   Intensity (1+ to 3+) 3+ 3+      RESULT HER-2/neu IHC assay (utilizing FDA-approved DAKO Hercep Test): Arch Pathol Lab Med [id number], ASCO/CAP HER2 Testing in Breast Cancer Update - Kassie et al     Test Score: 1+     HER2/neu:  Negative     RESULT Ki-67 (Clone MIB1):  5%     BREAST BIOMARKER REPORT     BLOCK:  B1  FIXATIVE:  Formalin      RESULT ER/PR:    ESTROGEN   RECEPTOR PROGESTERONE RECEPTOR   Antibody Clone SP1 Clone 636   %Tumor Staining >95% 0%   Intensity (1+ to 3+) 3+ NA      RESULT HER-2/neu IHC assay (utilizing FDA-approved DAKO Hercep Test): Arch Pathol Lab Med 2018:1-20, ASCO/CAP HER2 Testing in Breast Cancer Update - Kassie dunker al     Test Score: 2+     HER2/neu:  Equivocal     RESULT Ki-67 (Clone MIB1):  10%    07/19/2017 Outside pathology        ASSESSMENT & PLAN   SHELVIA FOJTIK is a 88 y.o. female presents for     1. 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 She elected not to have surgery at that time. She took tamoxifen -->anastrozole  for a short period of time, but discontinued endocrine therapy due to side effects.   - R breast biopsy at 12:00, 6 cm from  nipple on 07/13/2020 revealed invasive ductal carcinoma with lobular and focal cribriform features, grade 2 ER (+) PR (+) HER2 (-), Ki-67 5%  - L breast biopsy at 11:00, 5 cm from nipple on 07/13/2020 revealed invasive ductal carcinoma with lobular features, grade 2 ER (+) PR (-) HER2 (-), Ki-67 10%  - We had a long discussion about biology and treatment of breast cancer, with emphasis that cure can only be attained with multimodality treatments such as surgery/ +/- radiation and systemic ( endocrine) therapy .  - We have discussed that given her concern for anesthesia with Myasthenia Gravis, we can try neoadjuvant endocrine therapy with close observation of side effects.  - Letrozole  2.5 mg po daily 08/17/2020 --> 10/28/2020 discontinued due to diarrhea  - Tumors are not palpable on exam, and she would have to be followed with imaging   -  PETCT on 08/22/2020 with no evidence of FDG avid metastatic disease.   - Arimidex  10/28/2020 ---> 11/13/2021 d/c due to diarrhea   - Patient has opted against breast surgery d/t age, co morbidities, anaesthetic risk iso myasthenia gravis  - She would like to explore alternatives to surgery such as radiation. Discussed case with rad/onc team and will obtain repeat breast imaging. She consulted with Dr. Annabelle who advised that she is not currently a candidate for RT  - MR abd/pelv 11/23/2021 (-)   - Will try switching to Exemestane  25mg  po daily to evaluate for improvement in diarrhea- suspect class effect, but will try 11/14/2021 --->  - Diagnostic bilateral MMG/US  01/26/2022 with stable findings. Repeat in 6 months   - S/p stereotactic bx of right breast mass 03/21/2022. Pathology shows ADH   - Diagnostic mammo/US  10/17/2022 with stable findings. Repeat imaging in 6 months    07/24/2023   - Continue exemestane  25 mg po daily   - Diagnostic bilateral breast mammo/US  04/26/2023 stable  - Plan to repeat imaging in 6 mos, 09/2023   - RTC in 3 months  - Labs from 5/22 reviewed     02/11/24  - discussed CT scan in 10/2023 and again on 01/30/24 showed 15 mm lucent lesion in the left acetabulum and is again seen, which may represent a subchondral cyst, but given history of breast cancer may be concerning for a new lytic lesion   - labs today including tumor markers and SPEP/IFE  - MRI L hip ordered, I reached out to get it scheduled prior to the cardiac pacemaker appt  - continue exemestane  25mg  PO daily       2.  Diarrhea  - Longstanding, hx of IBS, but exacerbated by AI use, similar on letrozole  and arimidex .  - Consult with Dr. Nulsen 02/10/2021. May be related to aromatase inhibitor. However she states it has been worse in the last 1-2 months and this medication class was started earlier in the year (attributed diarrhea to letrozole , swithced to arimidex  in Sept). Will r/o infection, check FC. Can consider SIBO evaluation, colonoscopy for microscopic colitis.   - Held anastrazole 10/18/2021-->11/13/2021 with resolution of diarrhea.   - MR Abd/Pelv for further assessment given >20lbs weight loss associated with diarrhea 9/28 (-) for metastatic disease.   - Strongly encouraged patient to submit stool studies ordered by Dr. Nulsen  - Will start with 1/2 tablet imodium  q AM and increase to 1 tablet daily as needed  - Consider lomotil if imodium  is ineffective or not tolerated  - Switched to exemestane  11/14/2021  3.  Myasthenia gravis   - Followed by Dr. Karasozen    - Continue azathioprine , IVIG, mestinon      4. Bone density  - DXA scan on 08/26/2020 demonstrated osteopenia of the left femoral neck and total hip    07/24/2023  - Repeat DXA ordered 12/25/2023, patient asked to schedule       5. HFrEF  - TTE 01/22/24 with EF 27.5%         DIAGNOSES/ORDERS addressed on 02/11/2024:  1. Bilateral malignant neoplasm of breast in female, estrogen receptor positive, unspecified site of breast (HCC/RAF)    2. Age-related osteoporosis without current pathological fracture    3. Heart failure with reduced ejection fraction LVEF <=40% (HCC/RAF)    4. Lytic bone lesion of hip          - on exemestane   - continue close monitoring for toxicity of intensive drug therapy.      NEW PROBLEMS AS OF 02/11/2024:     []  New minor or self-limited problem (99202/99212).  []  2+ minor or self-limited problem, stable chronic illness, acute uncomplicated illness/injury (99203/99213).  []  Chronic illness with exacerbation/progression/tx side effect; 2+ stable chronic illnesses, new problem with uncertain prognosis, acute illness with systemic symptoms, or acute complicated injury (99204/99214).  [x]  Chronic illness with severe exacerbation/progression/tx side effect, acute/chronic illness or injury that poses threat to life or bodily function (99205/99215).      REVIEW OF DATA   I have  [x]  Reviewed/ordered []  1 []  2 [x]  >= 3 unique laboratory, radiology, and/or diagnostic tests noted below  Orders Placed This Encounter    CBC & Auto Differential     Standing Status:   Future     Number of Occurrences:   1     Expiration Date:   02/06/2025    Comprehensive Metabolic Panel     Standing Status:   Future     Number of Occurrences:   1     Expiration Date:   02/06/2025    CA27.29     Standing Status:   Future     Number of Occurrences:   1     Expiration Date:   02/06/2025    CEA     Standing Status:   Future     Number of Occurrences:   1     Expiration Date:   02/06/2025    CA 15-3     Standing Status:   Future     Number of Occurrences:   1     Expiration Date:   02/06/2025    PEP & Total Protein,Serum     Standing Status:   Future     Number of Occurrences:   1     Expiration Date:   02/06/2025    Immunofixation, Serum     Standing Status:   Future     Number of Occurrences:   1     Expiration Date:   02/06/2025    Total Protein,Serum    PEP,Serum       [x]  Reviewed []  1 []  2 [x]  >= 3 prior external notes and incorporated into patient assessment   I reviewed Dr. CARLIE K. THOMPSON, MD in Surgery, Surgical Oncology on 03/07/2022.  I reviewed Dr. PENNE LOIS QUAKER, MD in Medicine, Internal Medicine on 10/27/2023.  I reviewed Dr. Not Found in Not Found on Not Found.    []  Discussed management or test interpretation with external provider(s) on 02/11/2024 as noted:  RISK OF COMPLICATION   This clinical research associate has deemed the above diagnoses to have a risk of complication, morbidity or mortality of:   []  Minimal  []  Low  [x]  Moderate   []  due to prescription drug management   [] Decision re: minor surgery   [] Diagnosis or treatment limited by social determinants of health  []  Severe    []  due to intensive monitoring for chemotherapy toxicity   []  Decision elective surgery with risk factors  []  Decision regarding need for hospitalization  []  Advanced Care Planning decisions    SOCIAL DETERMINANTS OF HEALTH   []  The diagnosis or treatment of said conditions is significantly limited by social determinants of health:      I spent 30 minutes counseling and coordinating care for the items checked below on the day of service 02/11/2024  [x]  Preparing to see the patient (e.g., review of tests)  [x]  Obtaining and or reviewing separately obtained history   [x]  Performing a medically appropriate examination and/or evaluation   [x]  Counseling and educating the patient/family/caregiver   [x]  Ordering medications, tests, or procedures  []  Referring and communicating with other healthcare professionals (when not separately reported)  [x]  Documenting clinical information in the EHR  []  Independently interpreting results and communicating results to patient/ family/caregiver     Return in about 3 weeks (around 03/03/2024).       The above recommendation were discussed with the patient. The patient has all questions answered satisfactorily and is in agreement with this recommended plan of care.    Author: Tonja Medal, MD

## 2024-02-11 ENCOUNTER — Telehealth: Payer: MEDICARE

## 2024-02-11 ENCOUNTER — Telehealth: Payer: PRIVATE HEALTH INSURANCE

## 2024-02-11 ENCOUNTER — Ambulatory Visit: Payer: PRIVATE HEALTH INSURANCE

## 2024-02-11 NOTE — Telephone Encounter
 Tried to reach patient LVM to schedule 1 week video visit follow up with Dr Ely

## 2024-02-11 NOTE — Telephone Encounter
 Call Back Request      Reason for call back:  Pt is returning Raul's call from 5 minutes ago.  No response in Hem/Onc chat.  She wanted to let Donnamarie know she scheduled her MRI for 03/02/24.  CBN [id number]    Any Symptoms:  []  Yes  [x]  No      If yes, what symptoms are you experiencing:    Duration of symptoms (how long):    Have you taken medication for symptoms (OTC or Rx):      If call was taken outside of clinic hours:    [] Patient or caller has been notified that this message was sent outside of normal clinic hours.     [] Patient or caller has been warm transferred to the physician's answering service. If applicable, patient or caller informed to please call us  back if symptoms progress.  Patient or caller has been notified of the turnaround time of 1-2 business day(s).

## 2024-02-12 LAB — Total Protein, Serum (PEP): TOTAL PROTEIN, SERUM: 7 g/dL (ref 6.1–8.2)

## 2024-02-12 LAB — Cancer Antigen 15-3: CA 15-3: 11 U/mL (ref 0–35)

## 2024-02-14 ENCOUNTER — Other Ambulatory Visit: Payer: MEDICARE

## 2024-02-15 LAB — PEP, Serum: BETA GLOBULINS %: 12.2 % (ref 7.9–13.7)

## 2024-02-17 NOTE — Telephone Encounter
 Faxed DME orders to     [facility]  F [phone]

## 2024-02-18 ENCOUNTER — Telehealth: Payer: BLUE CROSS/BLUE SHIELD

## 2024-02-18 NOTE — Telephone Encounter
 Call Back Request      Reason for call back: Per Graciela from [employer] is calling to request  pt wants continuous portable oxygen with LPM settings , a fax was sent     Phone- [phone]  Fax- [phone]    Any Symptoms:  []  Yes  [x]  No      If yes, what symptoms are you experiencing:    Duration of symptoms (how long):    Have you taken medication for symptoms (OTC or Rx):      If call was taken outside of clinic hours:    [] Patient or caller has been notified that this message was sent outside of normal clinic hours.     [] Patient or caller has been warm transferred to the physician's answering service. If applicable, patient or caller informed to please call us  back if symptoms progress.  Patient or caller has been notified of the turnaround time of 1-2 business day(s).

## 2024-02-18 NOTE — Telephone Encounter
 Forwarded by: Noella Baton

## 2024-02-19 LAB — Immunofixation, Serum

## 2024-02-24 ENCOUNTER — Telehealth: Payer: BLUE CROSS/BLUE SHIELD

## 2024-02-24 ENCOUNTER — Other Ambulatory Visit: Payer: MEDICARE

## 2024-02-24 NOTE — Telephone Encounter
 Patient Phone Messages:     MD office returned call   X Patient returned call    Patient called with questions regarding surgery     Comments:    Pt left voicemail on Sunday afternoon returning missed call

## 2024-02-25 ENCOUNTER — Telehealth: Payer: BLUE CROSS/BLUE SHIELD

## 2024-02-25 NOTE — Telephone Encounter
 Called and spoke with the patient, went over medication instructions, post procedure restrictions, what to expect and report. Let pt know that mcm was also sent of what were discussed. All questions were answered. Verbalized understanding.

## 2024-02-28 ENCOUNTER — Other Ambulatory Visit: Payer: Commercial Managed Care - Pharmacy Benefit Manager

## 2024-03-02 ENCOUNTER — Inpatient Hospital Stay: Payer: Commercial Managed Care - Pharmacy Benefit Manager

## 2024-03-02 DIAGNOSIS — Z170 Estrogen receptor positive status [ER+]: Secondary | ICD-10-CM

## 2024-03-02 DIAGNOSIS — C50912 Malignant neoplasm of unspecified site of left female breast: Secondary | ICD-10-CM

## 2024-03-02 DIAGNOSIS — C50911 Malignant neoplasm of unspecified site of right female breast: Secondary | ICD-10-CM

## 2024-03-03 ENCOUNTER — Ambulatory Visit: Payer: BLUE CROSS/BLUE SHIELD

## 2024-03-03 LAB — CBC: MEAN PLATELET VOLUME: 10.1 fL (ref 9.3–13.0)

## 2024-03-03 LAB — CREATININE: ESTIMATED GFR 2021 CKD-EPI: 28 mL/min/1.73m2 (ref 0.60–1.30)

## 2024-03-03 LAB — Urea Nitrogen: UREA NITROGEN: 24 mg/dL — ABNORMAL HIGH (ref 7–22)

## 2024-03-03 LAB — Prothrombin Time Panel: INR: 1 s (ref 11.5–<=5.0)

## 2024-03-03 LAB — APTT: APTT: 24.1 s — ABNORMAL LOW (ref 24.4–36.2)

## 2024-03-03 LAB — Electrolyte Panel: CHLORIDE: 111 mmol/L — ABNORMAL HIGH (ref 96–106)

## 2024-03-03 LAB — Glucose, Whole Blood: GLUCOSE, WHOLE BLOOD: 91 mg/dL (ref 65–99)

## 2024-03-03 MED ADMIN — DERMABOND MINI SKIN ADHESIVE: @ 21:00:00 | Stop: 2024-03-03 | NDC 08252114512

## 2024-03-03 MED ADMIN — PLASMA-LYTE-A PH 7.4 IV SOLN: INTRAVENOUS | @ 20:00:00 | Stop: 2024-03-03 | NDC 00338022104

## 2024-03-03 MED ADMIN — PROPOFOL 200 MG/20ML IV EMUL: INTRAVENOUS | @ 20:00:00 | Stop: 2024-03-03 | NDC 63323026929

## 2024-03-03 MED ADMIN — PROPOFOL 200 MG/20ML IV EMUL: INTRAVENOUS | @ 21:00:00 | Stop: 2024-03-03 | NDC 63323026929

## 2024-03-03 MED ADMIN — IODIXANOL 320 MG/ML IV SOLN: INTRAVENOUS | @ 21:00:00 | Stop: 2024-03-03 | NDC 65219038310

## 2024-03-03 MED ADMIN — FENTANYL CITRATE (PF) 50 MCG/ML IJ SOLN (MULTI GPI): INTRAVENOUS | @ 21:00:00 | Stop: 2024-03-03 | NDC 00409909422

## 2024-03-03 MED ADMIN — LIDOCAINE HCL (PF) 1 % IJ SOLN: @ 21:00:00 | Stop: 2024-03-03 | NDC 00409427902

## 2024-03-03 MED ADMIN — VANCOMYCIN 1 MG/ML IRRIGATION SOLN: 500 mL | @ 21:00:00 | Stop: 2024-03-03 | NDC 00409433201

## 2024-03-03 MED ADMIN — PLASMA-LYTE-A PH 7.4 IV SOLN: @ 20:00:00 | Stop: 2024-03-03

## 2024-03-03 MED ADMIN — PROPOFOL 200 MG/20ML IV EMUL (ANES): INTRAVENOUS | @ 20:00:00 | Stop: 2024-03-03

## 2024-03-03 MED ADMIN — CEFAZOLIN SODIUM 1 G IJ SOLR: INTRAVENOUS | @ 20:00:00 | Stop: 2024-03-03 | NDC 00143992490

## 2024-03-03 MED ADMIN — DIPHENHYDRAMINE-ZINC ACETATE 2-0.1 % EX CREA: TOPICAL | Stop: 2024-03-03 | NDC 00904535431

## 2024-03-03 NOTE — OR PostOp
 ----------------------------------------------------------------------------------------------------------------------  (THE FOLLOWING IS FOR NURSING REFERENCE AND HAS BEEN PULLED FROM THE CURRENT CHART. PACU Phone: [phone])    Procedure(s) (LRB):  BIV PM IMPLANT **MDT** (N/A)  Systolic CHF, chronic (HCC/RAF) [I50.22]  HFrEF (heart failure with reduced ejection fraction) (HCC/RAF) [I50.20]  Treatment Team         Provider   Role Specialty Contact     Melia Bebe SQUIBB., MD      1st Provider Contact Medicine, Cardiology - Cardiovascular Disease  [phone]       Myrle Fines, MD      Attending Medicine, Clinical Cardiac Electrophysiology  [phone]   [phone]       X, Medicine - Team      Team -- --           __________________________________________________________________________________  Anesthesia Procedures      __________________________________________________________________________________    History  Past Medical History:   Diagnosis Date    Asthma     Breast cancer (HCC/RAF) 12/20/2018    CHF (congestive heart failure) (HCC/RAF)     COPD (chronic obstructive pulmonary disease) (HCC/RAF)     Diabetes mellitus (HCC/RAF)     Hypertension     Renal disorder      Past Surgical History:   Procedure Laterality Date    MR KNEE LEFT Left     meniscus     __________________________________________________________________________________    Labs  Recent Labs     03/03/24  0936   WBC 5.06   HCT 32.3*   HGB 10.6*   PLT 291   PT 13.1   APTT 24.1*   INR 1.0   NA 141   K 4.0   CL 111*   CO2 26   BUN 24*   CREAT 1.74*   GLUCOSE 91     __________________________________________________________________________________    Vital Signs  Last Recorded Vital Signs:    03/03/24 1500   BP:    Pulse: 79   Resp:    Temp:    SpO2: 98%     Temp Readings from Last 1 Encounters:   03/03/24 36 ?C (96.8 ?F) (Temporal)       Pain Information (Last Filed)       Score Location Comments Edu?    0-No pain None None None __________________________________________________________________________________    Intake and Output  No intake/output data recorded.  I/O this shift:  In: 235 [I.V.:235]  Out: -      __________________________________________________________________________________    IV Drips/Fluids/PCA:     __________________________________________________________________________________    Lerry, Drains, and Airways  Peripheral IV 20 G Left Antecubital (Active)       Wound 03/03/24 Left;Upper Chest (Active)     __________________________________________________________________________________    ----------------------------------------------------------------------------------------------------------------------      PACU NursingTransfer Note  3:02 PM, 03/03/2024      Isolation? No    Antibiotics in OR:  Last Antibiotic (last 24 hours) Showing orders from other encounters      Date/Time Action Medication Dose    03/03/24 1308 New Bag/ Syringe/ Cartridge   [left chest wall PPM pocket]    vancomycin  (Vancocin ) 500 mg in sodium chloride  0.9% 500 mL 1 mg/mL irrigation soln 500 mg    03/03/24 1211 Given    ceFAZolin  inj 2 g          Last Antiemetic:   Med Administrations and Associated Flowsheet Values (last 4 hours)       None          Acetaminophen   given @:  Med Administrations and Associated Flowsheet Values (last 24 hours)       None          Last pain medication:   Pain Meds (last 4 hours) Showing orders from other encounters      Date/Time Action Medication Dose Rate    03/03/24 1233 Given    fentaNYL  (PF) 50 mcg/mL inj 25 mcg     03/03/24 1231 Given    propofol  200 mg/20 mL inj 10 mg     03/03/24 1220 Given   [left upper chest wall]    lidocaine  PF 1% inj 10 mL     03/03/24 1155 Given    propofol  200 mg/20 mL inj 20 mg     03/03/24 1153 New Bag/ Syringe/ Cartridge    propofol  200 mg/20 mL inj 60 mcg/kg/min 21.6 mL/hr          Last Txp Anti-rejection medication:  Anti-rejection meds. (last 24 hours)       None Does the patient use prescription pain medication at home (prior to admission)?SABRASABRASABRAUnknown    Pain level: Acceptable for patient? Yes    Difficult Airway? No    Respiratory is at baseline? Yes    Has the patient received Flumazenil or Narcan in PACU? No  (if ''Yes'', must be at least 45 minutes prior to transfer)     Aldrete: 10    Level of Consciousness:  Awake, Alert, Oriented x four    Cardiac Rhythm?  vpaced    Surgical Site: Intact and Dry or with Minimal Drainage  Lines, Drains, and Airways       Wound  Duration             [REMOVED] Wound 07/11/23 Laceration Left Finger (Comment which one) 121 days    Wound 03/03/24 Left;Upper Chest <1 day                   Diet: Regular    Tolerating liquids: Undetermined    Activity: MAE: Has not ambulated    Voided:  Due to Void    Significant Other Location:  [facility]    Will Transport to: Floor                       With: RN & Monitor    Continuity of Care Issues from OR or PACU:   Xray  pa/lat pending.SABRA okay to go off monitor   C/o of left incision area itching, applied benadryl  topical

## 2024-03-03 NOTE — Updated H&P Note Attestation
 UPDATED H&P REQUIREMENT    WHAT IS THE STATUS OF THE PATIENT'S MOST CURRENT HISTORY AND PHYSICAL?   - There is no recent H&P <30 days.  Proceed to H&P Notes section for new H&P.     REFER TO MEDICAL STAFF POLICIES REGARDING PRE-PROCEDURE HISTORY AND PHYSICAL EXAMINATION AND UPDATED H&P REQUIREMENTS BELOW:    Tanda Corrente [facility] and [facility] Medical Staff Policy 200 - For Patients Undergoing Procedures Requiring Moderate or Deep Sedation, General Anesthesia or Regional Anesthesia    Contents of a History and Physical Examination (H&P):    The H&P shall consist of chief complaint, history of present illness, allergies and medications, relevant social and family history, past medical history, review of systems and physical examination, and assessment and plan appropriate to the patient's age.    For Patients Undergoing Procedures Requiring Moderate or Deep Sedation, General Anesthesia or Regional Anesthesia:    1. An H&P shall be performed within 24 hours prior to the procedure by a qualified member of the medical staff or designee with appropriate privileges, except as noted in item 2 below.    2. If a complete history and physical was performed within thirty (30) calendar days prior to the patient?s admission to the Medical Center for elective surgery, a member of the medical staff assumes the responsibility for the accuracy of the clinical information and will need to document in the medical record within twenty-four (24) hours of admission and prior to surgery or major invasive procedure, that they either attest that the history and physical has been reviewed and accepted, or document an update of the original history and physical relevant to the patient's current clinical status.    3. Providing an H&P for patients undergoing surgery under local anesthesia is at the discretion of the Attending Physician.     4. When a procedure is performed by a dentist, podiatrist or other practitioner who is not privileged to perform an H&P, the anesthesiologist?s assessment immediately prior to the procedure will constitute the 24 hour re-assessment.The dentist, podiatrist or other practitioner who is not privileged to perform an H&P will document the history and physical relevant to the procedure.    5. If the H&P and the written informed consent for the surgery or procedure are not recorded in the patient's medical record prior to surgery, the operation shall not be performed unless the attending physician states in writing that such a delay could lead to an adverse event or irreversible damage to the patient.    6. The above requirements shall not preclude the rendering of emergency medical or surgical care to a patient in dire circumstances.

## 2024-03-03 NOTE — H&P
 EP Preprocedure H&P  [facility]     Date/Time: 03/03/24  Referring Physician: [name], MD     Patient: [name]  Date of Birth: Dec 04, 1935     Reason for Consultation:   EP Care     Chief complain:       Chief Complaint   Patient presents with    New Patient         Problem List:   [name] is a 89 y.o. female who presents for initial EP Consult with history of:     - HFrEF with EF 20%                - LBBB QRS 145 msec                - Class III  - NICM  - Myasthenia gravis  - COPD  - Home oxygen PRN      History of Present Illness:   [name] is a 89 y.o. female who presents for initial EP Consult with me due to a history of as above who is referred for evaluation for progressive worsening of systolic HF with LBBB. She confirmed that despite her myasthenia and COPD which are significantly impacting her quality of life the last six months has decreased as the HF worsened.     03/03/24: patient understands rationale, risks, benefits for PPM implantation and wishes to proceed     Past Medical History:   As above in addition to:          Past Medical History:   Diagnosis Date    Asthma      Breast cancer (HCC/RAF) 12/20/2018    CHF (congestive heart failure) (HCC/RAF)      COPD (chronic obstructive pulmonary disease) (HCC/RAF)      Diabetes mellitus (HCC/RAF)      Renal disorder              Past Surgical History:   History reviewed. No pertinent surgical history.           Family History:   Family History (Click to Expand)   History reviewed. No pertinent family history.        Negative for sudden cardiac death, premature CAD     Social History:      Social History   Social History            Tobacco Use    Smoking status: Former       Current packs/day: 0.00       Types: Cigarettes       Quit date: 1985       Years since quitting: 40.6    Smokeless tobacco: Former   Substance Use Topics    Alcohol use: Never    Drug use: Never            Home Medications: Outpatient Medications Prior to Visit   Medication Sig Dispense Refill    ALBUTEROL  90 mcg/act inhaler INHALE 1-2 PUFF BY MOUTH EVERY 4 HOURS AS NEEDED FOR WHEEZING OR SHORTNESS OF BREATH 18 g 10    aspirin  81 mg EC tablet Take 1 tablet (81 mg total) by mouth daily. 90 tablet 3    azaTHIOprine  50 mg tablet Take 1.5 tablets (75 mg total) by mouth daily. 135 tablet 2    BUPROPION , SR, 200 mg 12 hr tablet TAKE 1 TAB BY MOUTH TWICE DAILY (ANTIDEPRESSANT) 60 tablet 10  clonazePAM  1 mg tablet TAKE 1 AND HALF TABLET BY MOUTH AT NIGHT 135 tablet 1    dapagliflozin  (FARXIGA ) 10 mg tablet Take 1 tablet (10 mg total) by mouth daily. 90 tablet 3    DEXILANT  60 MG DR capsule Take 1 capsule (60 mg total) by mouth daily. 90 capsule 3    DULOXETINE  20 mg DR capsule TAKE 3 CAPSULES (60 MG TOTAL) BY MOUTH DAILY 270 capsule 3    EPINEPHrine  0.3 mg/0.3 mL auto-injector INJECT 0.3ML (0.3MG ) SUBCUTANEOUSLY AS NEEDED FOR ANAPHYLAXIS (ACUTE AND CHRONIC RESPIRATORY FAILURE WITH HYPOXIA) 2 each 1    exemestane  25 mg tablet Take 1 tablet (25 mg total) by mouth daily. 90 tablet 3    fluticasone -vilanterol 100-25 mcg/act inhaler INHALE 1 PUFF BY MOUTH EVERY DAY (ACUTE AND CHRONIC RESPIRATORY FAILURE WITH HYPOXIA) 180 each 10    furosemide  20 mg tablet Take 1 tablet (20 mg total) by mouth daily. 90 tablet 3    LOSARTAN  100 mg tablet TAKE 1 TAB BY MOUTH EVERY DAY (HEART FAILURE, UNSPECIFIED) 30 tablet 10    MONTELUKAST  10 mg tablet TAKE 1 TAB BY MOUTH EVERY EVENING (ACUTE AND CHRONIC RESPIRATORY FAYLURE WITH HYPOXIA) 30 tablet 10    PENICILLIN  V POTASSIUM 500 mg tablet TAKE 1 TAB BY MOUTH TWICE DAILY (BACTERIAL INFECTION) 60 tablet 10    PYRIDOSTIGMINE  60 mg tablet TAKE 1 TAB BY MOUTH THREE TIMES DAILY (MYASTHENIA GRAVIS) 90 tablet 10    REPATHA  SURECLICK 140 MG/ML SOAJ INJECT 1 PEN UNDER THE SKIN EVERY 14 DAYS (HEART FAILURE, UNSPECIFIED) 2 mL 10    sodium chloride  0.9% IV soln          tiotropium 18 mcg/act inhalation capsule Place 1 capsule (18 mcg total) into inhaler and inhale daily. 90 capsule 3    ULTOMIRIS 300 MG/3ML injection          ULTOMIRIS 1100 MG/11ML SOLN          Albuterol  Sulfate AEPB Takes 1 to 2 puffs every 4 hours as needed for wheezing or SOB Inhale Takes 1 to 2 puffs every 4 hours as needed for wheezing or SOB . 3 each 3    Albuterol  Sulfate AEPB Takes 1 to 2 puffs every 4 hours as needed for wheezing or SOB Inhale Takes 1 to 2 puffs every 4 hours as needed for wheezing or SOB . 3 each 3      No facility-administered medications prior to visit.         Allergies:      Allergies         Allergies   Allergen Reactions    Beta Adrenergic Blockers Other (See Comments)       Avoid d/t Myasthenia Gravis  Avoid d/t Myasthenia Gravis       Levofloxacin Other (See Comments)       Flare of Myasthenia Gravis    Macrolides And Ketolides Other (See Comments)       Avoid d/t Myasthenia Gravis  Use with caution d/t Myasthenia Gravis  Use with caution d/t Myasthenia Gravis       Sulfa Antibiotics Other (See Comments)       May possibly have caused deafness in one ear  May possibly have caused deafness in one ear  other       Botulinum Toxins Other (See Comments)       Muscle weakness r/t Myasthenia Gravis  Muscle weakness r/t Myasthenia Gravis       Pollen Extract  Wheezing    Statins Other (See Comments)       Muscle aches  Muscle aches  Muscle aches       Plastic Tape Itching and Other (See Comments)       Turns the skin red where applied            Review of Systems:   A 14-system review of systems was obtained and unremarkable except as noted above.     Physical Exam:   Vitals: BP 136/74  ~ Pulse (!) 101  ~ Wt 139 lb (63 kg)  ~ SpO2 94%  ~ BMI 23.13 kg/m?   General: Awake. No acute distress. Well-developed and well-nourished.  Eyes: Extraocular movements intact. No scleral icterus or conjunctival pallor.  ENMT: Oropharynx clear. Mucous membranes moist.  Neck: Supple. Trachea midline. No appreciable thyromegaly.  Lungs: Clear to auscultation bilaterally. No accessory muscle use.  Cardiovascular: No precordial lifts or thrills. Regular rate and rhythm with normal S1 and S2. No gallops, murmurs, or rubs appreciated. Carotid pulses 2+ bilaterally without bruits. Jugular venous pressure 7 cm. Radial, femoral, and dorsalis pedis pulses 2+ bilaterally. No lower extremity edema.  Abdomen: Soft, nontender, and nondistended. Normoactive bowel sounds. No hepatomegaly or fluid wave.  Skin: Warm, dry. No rash.  Neuro: Grossly nonfocal.     Laboratory Data:            Lab Results   Component Value Date     NA 139 09/24/2023     K 4.5 09/24/2023     CL 102 09/24/2023     CO2 23 09/24/2023     BUN 27 (H) 09/24/2023     CREAT 1.75 (H) 09/24/2023     CALCIUM  9.6 09/24/2023     MG 2.1 (H) 06/14/2023            Lab Results   Component Value Date     CHOL 160 12/11/2022     CHOLHDL 55 12/11/2022     CHOLDLQ 76 12/11/2022     TRIGLY 181 (H) 12/11/2022          Lab Results   Component Value Date     WBC 5.52 09/24/2023     HGB 13.8 09/24/2023     HCT 43.0 09/24/2023     MCV 97.3 09/24/2023     PLT 299 09/24/2023     INR 0.9 07/18/2023     APTT <24.0 (L) 07/18/2023            Lab Results   Component Value Date     AST 21 09/24/2023     ALT 8 09/24/2023     ALKPHOS 80 09/24/2023     BILITOT 0.2 09/24/2023     ALBUMIN 4.2 09/24/2023            Lab Results   Component Value Date     BNP 355 (H) 08/19/2023     TSH 1.6 09/24/2023     HGBA1C 6.0 (H) 04/17/2023         Diagnostic/Imaging Studies:      Electrocardiogram (all ECGs reviewed by me)  July 2025 - SR with LBBB 145 msec     Cardiac Device Interrogation  N/A     Event Recorder (reviewed by me)  N/A     Echocardiogram (reviewed by me)  May 2025  1. Mildly increased left ventricular size.   2. Global hypokinesis of the left ventricle.   3. LV systolic function is severely reduced. The  calculated ejection fraction (Simpson's) is 22 %.   4. Diastolic function is indeterminant.   5. Mild to moderate mitral valve regurgitation.   6. Compared to prior study on 06/08/2022, LV EF has decreased and LV size has increased.     April 2024  1. Normal left ventricular size.   2. Mild basal septal left ventricular hypertrophy.   3. Left ventricular ejection fraction is approximately 45 to 50%.   4. Abnormal LV diastolic function (Grade I).   5. There is no significant valvular dysfunction.   6. Compared to prior study on 03/14/2021, EF has improved from 35-40%.     EPS/RFA  N/A     Coronary Angiogram (reviewed by me)  May 2025  Selective Coronary Angiography:   1. Dominance: Right   2. LM: The left main was a large caliber vessel, that bifurcated to give rise to the LAD and left circumflex arteries. There was no significant disease in the LM.   3. LAD: The left anterior descending artery was a large caliber vessel, that gave rise to several septal and diagonal branches. There was no significant epicardial disease in the LAD or its branches, just mild luminal irregularities.   4. LCx: The left circumflex artery was a large caliber vessel, that gave rise to two obtuse marginals. There was no significant epicardial disease in the LCx or its branches.   5. RCA: The right coronary artery was a large caliber, dominant vessel, that gave rise to a moderate caliber posterolateral branch, and a moderate caliber posterior descending artery. There was no significant epicardial disease in the RCA or its branches.      Stress Test  N/A     Advanced Cardiac Imaging (reviewed by me)  N/A     Review of Data:  I have:   [x]  Reviewed/ordered []   1 []   2 [x]   >=  3 unique laboratory, radiology, and/or diagnostic tests as noted  [x]  Reviewed/ordered []   1 []   2 [x]   >=  3 prior external notes and incorporated into patient assessment  []  Discussed management or test interpretation with external provider(s) as noted:     Lab Studies Reviewed on this Encounter:  Reviewed Lab Comment   [x]   CBC     [x]   Basic Metabolic Panel     []   Lipid Panel     []   HgbA1c []   Troponin     [x]   BNP     [x]   Other        Diagnostic Studies Reviewed/Ordered on this Encounter:  Reviewed Ordered Diagnostic Test   [x]   []   ECG   []   []   Stress ECG   [x]   []   Echo (TTE/TEE)   []   []   Stress TTE   []   []   MPS   []   []   Event/Holter   []   []   Catheterization   []   []   Cardiac Device Interrogation   []   [x]   CRT-P Implant       Impression:   [name] is a 89 y.o. female with:     - HFrEF with EF 20%                - LBBB QRS 145 msec                - Class III  - NICM  - Myasthenia gravis  - COPD  - Home oxygen PRN     Discussion:  For cardiac resynchronization implant (CRT-P and CRT-D) were discussed in detail today.     The indication, procedure description, follow up care, including benefits, alternateive and risks associated with Bi-Ventricular cardioverter defibrillator vs biventricular pacemaker with coronary sinus lead/left bundle pacing lead were explained to the patient in detail.  The use of diagrams, visual aids, imaging, illustrations, patient literature were employed to aid in the understanding of the pathophysiology of the disease process and for enhanced understanding management/treatment/interventions and others aspects of care/management.       The overall succsses rate of being able to placed a left ventricular lead and the likelihood of clinical response/benefit were discussed in detail.  The risks include, but are not limited to: bleeding, hematoma, deep vein thrombosis, infection, AV fistula,  Blood vessel damage, lung injury or collapse, heart injury or perforation, heart attack, stroke, device or lead malfunction, lead dislogement, phrenic pacing, atrial or ventricular arrhythmias, worsening heart failure and even death among others.      The patient fully understood the procedure and the risks, wished to proceed based on a shared decision model.     Recommendations/Plan:      - Proceed with CRT-P per patient preference                - Plan procedure first half of the day (COPD, Myasthenia, others)                - Overnight stay anticipated  - Patient agrees to have DNR status changed during procedure and for immediate postoperative period     Current plan of care was discussed in detail with the patient and all questions were answered.      Ozell Keeler, MD  Clinical Cardiac Electrophysiology Fellow

## 2024-03-03 NOTE — End of Shift Summary
 Patient's Clinical Goal:   Clinical Goal(s) for the Shift: vss, pain mgmt, CXR  Identify possible barriers to advancing the care plan: none  Stability of the patient: Moderately Unstable - medium risk of patient condition declining or worsening    Progression of Patient's Clinical Goal:       A/O x4, VSS.  Safety measures maintained.  RA no sob or distress noted, denies chest pain.  Cardiac monitored, NSR.   SPO2 >93% on 2L NC.  Voiding.  Had BM today.  Call light within reach at all times.   L chest incision noted with redness from chg allergy/tape allergy MD aware and ordered topical benadryl  cream.  PIV S/L and flushed.  CXR done for pacemaker placement.  Regular diet.  All nursing needs met, kept comfortable and endorsed to PM RN for POC.

## 2024-03-03 NOTE — Op Note
 PROCEDURE: DUAL CHAMBER PERMANENT PACEMAKER IMPLANTATION     DATE OF PROCEDURE: 03/03/2024    PATIENT: [name]  MRN: [id number]  DOB: Aug 01, 1935  DATE OF SERVICE: 03/03/2024    REFERRING PRACTITIONER: Myrle Fines, MD  PRIMARY CARE PROVIDER: [name], MD    ATTENDING PHYSICIAN: Fines Myrle, MD  ASSISTING PHYSICIAN: Ozell Keeler, MD    PRE-OPERATIVE DIAGNOSIS:   1. HFrEF with EF 20%  2. LBBB with QRS 152 ms  3. NICM  4. Myasthenia gravis  5. COPD on home O2 PRN    POST-OPERATIVE DIAGNOSIS: Same; Successful dual chamber permanent pacemaker implantation (left bundle branch area pacing)    PROCEDURE(S):   Dual chamber permanent pacemaker implantation  (left bundle branch area pacing)  Permanent pacemaker interrogation and programming  Fluoroscopy supervision, operation and interpretation  Ultrasound guidance of vascular access  Right ventriculography    ANESTHESIA: MAC per Anesthesia Service, Lidocaine  1% SQ    SURGICAL PROPHYLAXIS: 2 gm cefazolin  IV prior to the start of procedure, vancomycin  solution for pocket irrigation    CLINICAL HISTORY:  [name] is a 89 y.o. female with HFrEF with EF 20%, LBBB with QRS 145 ms. After a discussion regarding options in a shared decision-making model, the patient was referred for implantation of CRT-P.     INFORMED CONSENT:  The procedure of permanent pacemaker implantation was discussed in detail with [name]. Risks, benefits, alternatives and possible outcomes of the procedure were explained at length. The risks include, but are not limited to: infection, bleeding requiring blood transfusion, blood vessel damage, lead-related vein thrombosis, lung injury or collapse, heart injury or perforation, cardiac tamponade, heart attack, stroke, device or lead malfunction, lead dislodgement, muscle and/or nerve stimulation, device migration in the pocket, device erosion through skin, random component or battery failures that cannot be predicted and can lead to failure to deliver appropriate therapy, inappropriate therapy delivery, worsening of the kidney function that may require temporary or permanent hemodialysis, need for emergency open heart surgery, and even death. The patient was informed that an attempt to achieve conduction system pacing with a left bundle branch area pacing lead would be performed in effort to preserve left ventricular systolic function. The patient was informed that this involves placing the lead slightly deeper within the ventricular septum, but that the device and system is otherwise identical to a standard pacemaker system.  Major adverse events were estimated at less than 1%. [name] was given literature on the procedure and all her questions were answered. The patient fully understands the procedure, risks, benefits and alternatives and wishes to proceed. [name] signed the written informed consent and a copy of it was placed in the chart.    PROCEDURE DETAILS:  The patient was prepped and draped in the usual strict sterile fashion. Local anesthetic was infiltrated medial to the left deltopectoral groove. Access to the axillary vein was obtained percutaneously using ultrasound guidance. The wire was seen to cross below the diaphragm on fluoroscopy. Another separate access to axillary vein was obtained in similar fashion with a separate percutaneous access. Just inferior to the wires, an incision was made with a scalpel and extended to the prepectoral fascia using electrocautery. The wires were internalized into the pocket.    A 104F peel-away sheath was then inserted into systemic circulation.  A C315His sheath was then advanced into the right ventricle over a guidewire and a right ventriculogram was performed to confirm location with respect to the septum and  tricuspid valve. A 3830 lead was advanced to a region 1-2 cm distal to the His bundle based on fluoroscopic reference imaging. Contrast injection demonstrated perpendicular orientation of the lead and sheath.  The lead was deployed and contrast injection performed to assess lead depth. The lead was advanced until approximately 11  mm within the septum based on serial contrast injections.  Pacing in unipolar and bipolar configuration demonstrated left bundle branch area pacing pattern with qR morphology in lead V1.  Lead parameters were measured and deemed satisfactory.  Final QRS 128 ms, LVAT 82 ms. The C315His sheath was then slit under flouroscopy, followed by the 35F sheath.     An atrial lead was advanced through a 7-French peel-away introducer sheath and positioned in right atrial appendage. Lead characteristics were measured for both leads and were satisfactory. High output pacing confirmed absence of phrenic capture. Sheaths were peeled and leads were then sutured to the fascial plane with 2-0 Ethibond. Retained guidewire was removed. Adequate electrode parameters were confirmed prior to attachment of the pacemaker generator to the leads. Leads were connected to the pacemaker generator. After that, leads and the generator were inserted into the pocket. The pocket was irrigated with an vancomycin -based solution. Hemostasis was reverified. Device was interrogated and satisfactory lead parameters were confirmed via device. Tyrx antimicrobial pouch was placed in pocket prior to closure. The wound was closed in layers.    CLOSURE: 2-0 Vicryl was used for fascial closure, 2-0 Vicryl was used for subcutaneous closure, 3-0 Vicryl was used for subcuticular closure. The incision was then protected with Dermabond and clear adhesive dressing.    COMPLICATIONS: None  ESTIMATED BLOOD LOSS: < 5 mL  CONTRAST: 35 mL  FLUOROSCOPY TIME: 5.2 minutes, dose 12 mGy, DAP 125.6 microGy*m^2    IMPLANTED HARDWARE, PARAMETERS, SETTINGS:      FINAL DIAGNOSES:   1. HFrEF with EF 20%  2. LBBB with QRS 145 ms  3. NICM  4. Myasthenia gravis  5. COPD on home O2 PRN  6. Successful implantation of a dual chamber permanent pacemaker including left bundle branch area pacing lead    PLAN:  Admit for overnight observation per patient and family preference  PA and lateral chest x-ray  12 lead ECG  Incision check in 2 weeks.  Follow-up at [facility] in 3 months.    Ozell Keeler, MD  Clinical Cardiac Electrophysiology Fellow      I have read and edited the note above and agree with reported findings.   I was scrubbed in and present for the entire procedure.     Eric Melnick, MD  Attending Physician, Cardiac Electrophysiology

## 2024-03-04 MED ADMIN — POLYETHYLENE GLYCOL 3350 17 G PO PACK: 17 g | ORAL | @ 18:00:00 | Stop: 2024-03-04

## 2024-03-04 MED ADMIN — POLYETHYLENE GLYCOL 3350 17 G PO PACK: 17 g | ORAL | @ 02:00:00 | Stop: 2024-03-04

## 2024-03-04 MED ADMIN — BUPROPION HCL ER (SR) 100 MG PO TB12: 200 mg | ORAL | @ 07:00:00 | Stop: 2024-03-04 | NDC 68084069711

## 2024-03-04 MED ADMIN — BUPROPION HCL ER (SR) 100 MG PO TB12: 200 mg | ORAL | @ 18:00:00 | Stop: 2024-03-04 | NDC 68084069711

## 2024-03-04 MED ADMIN — BUPROPION HCL ER (SR) 100 MG PO TB12: 200 mg | ORAL | @ 06:00:00 | Stop: 2024-03-04

## 2024-03-04 MED ADMIN — DULOXETINE HCL 30 MG PO CPEP: 60 mg | ORAL | @ 18:00:00 | Stop: 2024-03-04 | NDC 68001059505

## 2024-03-04 MED ADMIN — DULOXETINE HCL 30 MG PO CPEP: 60 mg | ORAL | @ 02:00:00 | Stop: 2024-03-04 | NDC 68001059505

## 2024-03-04 MED ADMIN — FLUTICASONE-SALMETEROL 100-50 MCG/ACT IN AEPB: 1 | RESPIRATORY_TRACT | @ 05:00:00 | Stop: 2024-03-04

## 2024-03-04 MED ADMIN — FLUTICASONE-SALMETEROL 100-50 MCG/ACT IN AEPB: 1 | RESPIRATORY_TRACT | @ 18:00:00 | Stop: 2024-03-04

## 2024-03-04 MED ADMIN — LOSARTAN POTASSIUM 50 MG PO TABS: 100 mg | ORAL | @ 02:00:00 | Stop: 2024-03-04 | NDC 50268050511

## 2024-03-04 MED ADMIN — LOSARTAN POTASSIUM 50 MG PO TABS: 100 mg | ORAL | @ 18:00:00 | Stop: 2024-03-04 | NDC 50268050511

## 2024-03-04 MED ADMIN — SPIRONOLACTONE 25 MG PO TABS: 25 mg | ORAL | @ 02:00:00 | Stop: 2024-03-04 | NDC 60687046511

## 2024-03-04 MED ADMIN — SPIRONOLACTONE 25 MG PO TABS: 25 mg | ORAL | @ 18:00:00 | Stop: 2024-03-04 | NDC 60687046511

## 2024-03-04 MED ADMIN — TIOTROPIUM BROMIDE 2.5 MCG/ACT IN AERS: 2 | RESPIRATORY_TRACT | @ 05:00:00 | Stop: 2024-03-04

## 2024-03-04 MED ADMIN — TIOTROPIUM BROMIDE 2.5 MCG/ACT IN AERS: 2 | RESPIRATORY_TRACT | @ 18:00:00 | Stop: 2024-03-04

## 2024-03-04 MED ADMIN — MONTELUKAST SODIUM 10 MG PO TABS: 10 mg | ORAL | @ 02:00:00 | Stop: 2024-03-04 | NDC 00904680861

## 2024-03-04 MED ADMIN — METOPROLOL SUCCINATE ER 25 MG PO TB24: 25 mg | ORAL | @ 02:00:00 | Stop: 2024-03-04 | NDC 60687039011

## 2024-03-04 MED ADMIN — METOPROLOL SUCCINATE ER 25 MG PO TB24: 25 mg | ORAL | @ 18:00:00 | Stop: 2024-03-04 | NDC 60687039011

## 2024-03-04 MED ADMIN — PYRIDOSTIGMINE BROMIDE 60 MG PO TABS: 60 mg | ORAL | @ 18:00:00 | Stop: 2024-03-04 | NDC 68084049411

## 2024-03-04 MED ADMIN — PYRIDOSTIGMINE BROMIDE 60 MG PO TABS: 60 mg | ORAL | @ 07:00:00 | Stop: 2024-03-04 | NDC 68084049411

## 2024-03-04 MED ADMIN — FUROSEMIDE 20 MG PO TABS: 20 mg | ORAL | @ 18:00:00 | Stop: 2024-03-04 | NDC 00904717761

## 2024-03-04 MED ADMIN — FUROSEMIDE 20 MG PO TABS: 20 mg | ORAL | @ 02:00:00 | Stop: 2024-03-04 | NDC 00904717761

## 2024-03-04 MED ADMIN — AZATHIOPRINE 50 MG PO TABS: 75 mg | ORAL | @ 18:00:00 | Stop: 2024-03-04 | NDC 68084022911

## 2024-03-04 MED ADMIN — AZATHIOPRINE 50 MG PO TABS: 75 mg | ORAL | @ 06:00:00 | Stop: 2024-03-04

## 2024-03-04 MED ADMIN — AZATHIOPRINE 50 MG PO TABS: 75 mg | ORAL | @ 07:00:00 | Stop: 2024-03-04 | NDC 68084022911

## 2024-03-04 NOTE — Nursing Clinical Note
 Discharge Team Note  Requested by: RN    Needs for Discharge:    Interventions:    Disposition:  Patient was escorted to Discharge Lounge to wait for the ride [x]    Patient was escorted to [facility] hospital front entrance [x]     Patient was discharged home accompanied by:  Daughter [x]    Sent home with all belongings, including walker and oxygen tank.

## 2024-03-04 NOTE — Discharge Instructions
 Discharge Instructions for Patients with  Pacemaker (PM)    Dear Ms. [name],    You have just had a procedure in which a pacemaker device was placed in your chest to help regulate your heart rate and rhythm. We will be working with you to assist you as you make the adjustment to living with a pacemaker.  The following information is provided to help answer any post- procedure questions you may have.    Follow-up Appointments  [facility] - 03/13/24 @ 10:20 AM for incision check   Location: [facility], [address], Room 690  Pacemaker Clinic - Please call [phone] within the next two weeks to schedule an appointment for PM interrogation in 3 months.  Location: [facility], [address], Room 690    Always carry with you the pacemaker Identification Card.  You will receive a temporary card before you go home from the hospital and the manufacturer will send you a permanent identification card within 6-8 weeks after your surgery.  The identification card contains important information about your PM.  If you lose your card, contact the PM Clinic at [phone] for assistance in contacting the manufacturer for a new one.     Incision Care  Keep the incision covered with the gauze dressing until your scheduled follow-up appointment.  Do not remove the gauze dressing at home - we will remove it during your clinic visit.   It is essential to keep the dressing over the incision dry.  We recommend that you use sponge baths from the waist up until your follow up appointment or if you only have a shower, please make sure to cover the dressing with plastic wrap so it does not get wet.    DANGER SIGNALS TO WATCH FOR AT HOME  Call your implanting physician if you experience any of the following:  Fever (greater than 101 degrees Fahrenheit or greater than 38.5 degrees Celsius)  Chills  Signs of poor wound healing (ex: increasing redness, heat, swelling, oozing, bleeding, open incision)  Chest pain or discomfort that is different from the incision pain  Numbness, increasing pain, or swelling of the arm on the side the PM is implanted    Pain  Observe accurately the site and characteristics of the pain you are experiencing. Incision pain is usually localized to the incision site.  Tylenol  may be taken for incision pain.    When Can You Resume Your Usual Activities  Activities: Do not lift your arm on the side that the PM is implanted higher than the level of your shoulder for two weeks.  This decreases your risk of dislodging the lead.  Caution with contact sports (may damage battery box).  Delay weight lifting, golf, tennis, swimming, and throwing a baseball for 6 weeks until the leads have time to fixate into the heart muscle.  Walking: Initially, walk three times a day, increasing distance as tolerated.   Sleeping: Avoid sleeping on the same side as the PM while the incision is healing.  Sexual Activity: Ok to resume sexual activity while avoiding pressure on the PM.    Precautions  Magnetic Fields:  Many things in the environment contain magnets.  The PM paces at a fixed rate in the presence of a magnet.  It returns to normal operation when you distance yourself from the magnet.  Walk normally through airport detectors. The metal in the PM will set off the alarm.  Show security your PM identification  card.  It is preferred that security personnel do not use the wand close to your PM and instead ask for them to hand search.    Avoid leaning over running engines on cars and lawnmowers.  Do not lift stereo speakers (large magnets inside)  Avoid areas of arc welding  Contact your physician before using the following:  Electrocautery (used during surgery)  TENs units (for pain control)  Diathermy or other physical therapy treatments  MRI (x-ray equipment)  Radiation treatments  Any surgical procedure  Dental procedures where electrocautery will be used  Certain types of x-ray therapies (ask if you are not sure)    Travel  Travel is OK but before you go check with your physician and the PM clinic personnel.  Take an adequate supply of your routine medication with you.    Emergency Procedures  If a PM should fail to function properly, you may experience similar symptoms that occurred before the PM was implanted.  If any of these previous symptoms should occur, please call your cardiologist.     We hope you found this information helpful.  If you have any further questions please feel free to contact us  at [phone].           Sincerely,     [facility] Faculty and Staff

## 2024-03-04 NOTE — Nursing Clinical Note
 Patient read and given dc instructions. Patient verbalizes understanding. PIV removed tip intact. Patient has all belongings, no medications ordered. Patient eating breakfast awaiting ride home

## 2024-03-05 ENCOUNTER — Telehealth: Payer: BLUE CROSS/BLUE SHIELD

## 2024-03-05 ENCOUNTER — Telehealth: Payer: MEDICARE

## 2024-03-05 ENCOUNTER — Telehealth: Payer: PRIVATE HEALTH INSURANCE

## 2024-03-05 NOTE — Telephone Encounter
 Home Health Agency Name: [facility]      Contact person?s name:Stacey  Phone Number:[phone]  Fax [phone]  Email:           Reason for Call:  Other            Additional Details:Requesting resumption of care order to be placed by pcp and faxed over due to pt stating she had surgery for pacemaker. Also asking for surgery info notes to be sent over as well.            Patient or caller has been notified of the turnaround time of 1-2 business day(s).

## 2024-03-05 NOTE — Telephone Encounter
 Please resume care.  Electronically signed  Penne Quaker, MD

## 2024-03-05 NOTE — Telephone Encounter
 Home Health Agency Name: [facility]     Contact person?s name:Stacey  Phone Number:[phone]  Fax Number:[phone]  Email:        Reason for Call:  Other         Additional Details:Requesting resitement of care order to be placed by pcp and faxed over due to pt stating she had surgery for pacemaker.         Patient or caller has been notified of the turnaround time of 1-2 business day(s).

## 2024-03-05 NOTE — Telephone Encounter
 Faxed MD order to    [facility]   Phone Number: [phone]  Fax Number: [phone]

## 2024-03-05 NOTE — Telephone Encounter
 Message to Practice/Provider      Message: Patient used [facility] and they provide nursing care for her at her residence. In order for them to resume care they need a resumption of care to continue care after her procedure. The daughter is asking for us  to fax that form to the [facility], their fax number is [phone] attention [place].     Return call is not being requested by the patient or caller.    Patient or caller has been notified that their message will be reviewed within 1-2 business days.

## 2024-03-06 DIAGNOSIS — C50912 Malignant neoplasm of unspecified site of left female breast: Secondary | ICD-10-CM

## 2024-03-06 DIAGNOSIS — C50911 Malignant neoplasm of unspecified site of right female breast: Secondary | ICD-10-CM

## 2024-03-06 DIAGNOSIS — Z170 Estrogen receptor positive status [ER+]: Principal | ICD-10-CM

## 2024-03-06 NOTE — Progress Notes
 Patient Consent to Telehealth   The patient agreed to participate in the video visit prior to joining the visit.      HEMATOLOGY/ONCOLOGY OUTPATIENT NOTE    PATIENT: [name]  MRN: [id number]  DOB: 09-Jul-1935  DATE OF SERVICE: 03/11/2024  Referring MD: [name], MD    Reason for Consultation/ Chief Complaint:  Bilateral breast cancer    HPI   [name] is a 89 y.o. female with PMH including myasthenia gravis, asymmetric hearing loss, fibromyalgia, DM with CKD, major depression, interstitial cystitis, IBS, COPD who presents to establish care for breast cancer.     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 07/19/2017 Screening detected right breast calcifications and distortion in the upper outer quadrant: Biopsy DCIS+ ALH +CSL; 4 cm apart, axilla negative.    Neoadjuvant tamoxifen started 10/30/17, stopped after 4-6 weeks due to dry mouth and mouth sores. Switched to anastrozole  01/29/18, discontinued because it made her throat dry. Declined additional antiestrogen therapy.   Moved on October of 2020 to [place]      Ms. [name] then noted left breast pain in April 2022.     Underwent a diagnostic bilateral breast imaging 06/13/2020 which revealed:    - Right breast: solid mass with indistinct margins measuring 14 x 4 x 10 mm in the right breast at 1 o'clock located 6 centimeters from the nipple with associated microclip,  not parallel solid mass with irregular margins measuring 8 x 4 x 5 mm seen in the right breast at 12 o'clock, upper outer quadrant located 6 centimeters from the nipple.    - Left breast: irregular mass with angular margins measuring 10 x 9 x 9 mm seen in the left breast at 11 o'clock located 5 centimeters from the nipple, axillary lymph node in the left axilla with cortical thickness measuring up to 6 mm.    R breast biopsy at 12:00, 6 cm from nipple on 07/13/2020 revealed invasive ductal carcinoma with lobular and focal cribriform features, grade 2 ER (+) PR (+) HER2 (-), Ki-67 5%    L breast biopsy at 11:00, 5 cm from nipple on 07/13/2020 revealed invasive ductal carcinoma with lobular features, grade 2 ER (+) PR (-) HER2 (-), Ki-67 10%     Breast MRI performed on 08/05/2020 that redemonstrated multifocal right breast malignancy and left breast IDC with an additional 4.3cm of NME that spanned nearly the entire left breast that appears suspicious.     [facility] genetic testing (-) for pathogenic variants.     08/17/2020 Patient presents to establish care for breast cancer. She has seen Dr. Sebastian to discuss surgical treatment, and is quite worried about anesthesia with her myasthenia gravis . She is interested in neoadjuvant therapy     09/13/2020 Patient presents for follow-up of breast cancer. PETCT on 08/22/2020 with no evidence of FDG avid metastatic disease. DXA scan on 08/26/2020 demonstrated osteopenia of the left femoral neck and total hip.     10/28/2020 Patient presents for follow-up of breast cancer. Labs on 09/13/2020 showed WBC 5.3, hgb 11.4, platelets 300, ANC 3.58. Endorses fatigue, frequent diarrhea. BM anytime she eats, usually about 3 times a day. Diarrhea is more frequent in the morning. Also reports hot flashes. Breasts are tender, using a larger bra size.     11/25/2020 Patient presents for follow-up of breast cancer. Labs on 9/2/202 showed WBC 6.7, hgb 12.0, platelets 384,  ANC 4.80.  Breast MRI on 11/18/2020 with stable to slightly decreased size of known biopsy proven malignancies in the right breast, decreased size of the irregular mass with microclip at 11:00, 5 cm from the nipple, consistent with known malignancy, nonmass enhancement spanning 40 mm has also slightly decreased in size and now demonstrates subthreshold kinetics, this remains suspicious, previously described oval enhancing mass at 4:00 is no longer identified.     01/03/2021 Patient presents for follow-up of breast cancer. Labs on 12/20/2020 showed WBC 6.4, hgb 9.7, platelets 278, ANC 5.00.     Hospital admission 10/23-10/25/2022. P/w atypical chest pain x 2-3 days with new LBBB but negative troponin and relief w/ antacid medications most c/f upper GI pathology such as severe GERD, PUD, or gastritis. CT AP without acute abnormality. Initiated pantoprazole  40 mg PO bid with improvement in symptoms with PPI and GI cocktail w/maalox. Patient able to tolerate all meals without recurrent chest pain. GI was consulted and given improvement in symptoms, EGD deferred. Continue Maalox q4h PRN. H pylori stool Ag discontinued given long term PPI use. Continue dexelant outpatient. Ondansetron  4 mg PO q6hr PRN Rx for nausea. Outpatient GI follow-up.     10/18/2021 Patient presents for follow-up of breast cancer. Labs on 09/01/2021 showed WBC 4.94, hgb 11.8, platelets 342. Breast MRI 05/03/2021 with Interval decrease in size of known malignancies in the right breast with trace residual enhancement, Grossly stable irregular mass at 11 o'clock corresponding to biopsy-proven malignancy,  Interval resolution of previously described regional nonmass enhancement in the upper inner, upper outer, and lower outer quadrants. She admits to persistent diarrhea associated with significant weight loss. She often does not eat during the day because she is fearful of leaving the house d/t diarrhea. She does not take much imodium . The diarrhea is dependent on what she eats. Has longstanding hx of IBS. Diarrhea is improved when she takes dexilant  but was unable to obtain the dexilant  in the preferred form for a while. She denies B symptoms. Admits to occasional vomiting associated with the diarrhea. Continues with IVIG for myasthenia gravis. Has decided she would not like to pursue breast surgery due to age, co morbidities, anaesthetic risks.     11/14/2021 Patient presents for follow-up of breast cancer. Labs on 10/18/2021 showed WBC 4.7, hgb 12.7, platelets 379, ANC 3.81, CEA 5.00. CT chest 11/13/2021 (-). Met with Dr. Annabelle today. Held Arimidex  from last visit until yesterday. Diarrhea resolved while she held the Anastrazole. She actually became constipated.     01/11/2022 Patient presents for follow up of breast cancer. MR abd/pelv 11/23/2021 (-). Labs on 01/03/2022 showed WBC 5.37, hgb 12.8, PLT 339. Switched to exemestane  last visit    05/22/2022 Patient presents for follow up of bilateral breast cancer. Labs on 03/27/2022 showed WBC 5.89, hgb 12.8, platelets 358. Reports diarrhea has resolved since switching to exemestane     12/25/2022 Patient presents for follow up of bilateral breast cancer. Diagnostic mammo/US  10/17/2022 stable.     07/24/2023 Patient presents for follow up of bilateral breast cancer. Diagnostic mammo/US  04/26/2023 stable. Recently moved to [place] (living facility). S/p angiogram 5/22 with Dr. Alyn. Lasix  on hold     02/11/24: Patient presents for follow up of bilateral breast cancer here with daughter. She's had multiple hospitalizations since the last visit, for COPD exacerbation 01/21/24 and more recently for diarrhea. A CT scan in 10/2023 and again on 01/30/24 showed 15 mm lucent lesion in the left acetabulum and is again seen, which may represent a subchondral  cyst, but given history of breast cancer may be concerning for a new lytic lesion. No hip pain at this time.     03/11/24: Patient presents for follow up of bilateral breast cancer on telemed. Feeling well, stable symptoms, states it is about quality of life. Her granddaughter just passed away at age 24 1.5 weeks. Reviewed MRI of L hip. Discussed options: (1) short term 46-month interval follow-up, (2) further evaluation with nuclear medicine bone scan versus PET/CT, or (3) tissue sampling.      ECOG performance status: 0  PAST HISTORY   PMH:  Patient Active Problem List    Diagnosis Date Noted    Osteoporosis 02/07/2024    Lytic bone lesion of hip 02/07/2024    Hyperkalemia 07/25/2023    Epiretinal membrane, bilateral 05/18/2022     Not visually significant  Monitor      PVD (posterior vitreous detachment), bilateral 05/18/2022     No evidence of lesions predisposing to retinal detachment. Retinal detachment precautions reviewed with patient x3.    Discussed warning signs of retinal tear and retinal detachment: If patient experiences worsening flashes, worsening floaters, a fixed-visual field deficit, or any deterioration of vision contact the office immediately even after regular hours.       Chorioretinal scars, bilateral 05/18/2022     Stable  Monitor      Heart failure with reduced ejection fraction LVEF <=40% (HCC/RAF) 11/25/2021    Atypical chest pain 12/20/2020    Acute chest pain 12/18/2020    Immunosuppressed status 03/21/2020    HTN (hypertension) 10/31/2019    Aortic calcification 09/25/2019    Refractive error 04/28/2019     07/03/2022 - Dispensed spectacle prescription for distance and optional near.  Dispensed retaine for visual stability, particularly when eyes are fatigued      Ptosis of eyelid 04/28/2019    Pseudophakia of both eyes 04/28/2019     Patient endorses history of movement during CEIOL with atypical lens placment right eye. Lens appears stable and centered right eye, slightly superior displaced but stable right eye. Monitor      Benzodiazepine dependence (HCC/RAF) 12/20/2018    Breast cancer (HCC/RAF) 12/20/2018    Spondylosis without myelopathy or radiculopathy, lumbar region 11/28/2018    Anxiety 11/27/2018    CKD (chronic kidney disease), stage III (HCC/RAF) 11/27/2018    Nocturnal hypoxemia 04/07/2018     Last Assessment & Plan:   We discussed potential impact of hypoxemia from her COPD on other body function  Plan-overnight oximetry      Major depression, recurrent, chronic 12/16/2017    Diabetes mellitus (HCC/RAF) 12/16/2017    Osteoarthritis of knee 05/17/2017    H/O arthroscopy 03/06/2017    Myasthenia gravis with exacerbation (HCC/RAF) 12/18/2016     05/18/2022  Stable with treatment, no double vision. Referral to optom for glasses. All risks, benefits, and alternatives discussed with patient. Follow up when needed      Vaginal atrophy 08/16/2016    Acute medial meniscal tear, left, subsequent encounter 07/18/2016    Renal cyst 03/12/2016    Asthmatic bronchitis, mild persistent, with acute exacerbation 12/29/2015     Last Assessment & Plan:   Continue rescue inhaler as needed. Try adding sample Bevespi maintenance inhaler to see if this helps.  She is currently on prednisone  20 mg daily maintenance and I don't think adding a steroid inhaler component will make any difference.      Urethral caruncle 07/26/2015    Chronic cholecystitis 01/14/2015  Angioedema 05/23/2014     Attributed to injected methylprednisolone    Last Assessment & Plan:   We discussed limited ability to test for possible triggers. We are asking our labs sources about ability to test against methylprednisolone for injection.  A different brand might be worth trying. She can try pre-medicating with an antihistamine.      Chronic interstitial cystitis 10/01/2011    Insomnia 05/31/2007     Qualifier: Diagnosis of   By: Neysa MD, Clinton D    Last Assessment & Plan:   I'm concerned that some of her sleep problems may reflect obstructive sleep apnea based on her palate and which she can tell me about her sleep patterns without a witness. We also want to know about oxygenation during sleep.  Plan-schedule sleep study  Qualifier: Diagnosis of   By: Neysa MD, Clinton D      Last Assessment & Plan:   I'm concerned that some of her sleep problems may reflect obstructive sleep apnea based on her palate and which she can tell me about her sleep patterns without a witness. We also want to know about oxygenation during sleep.  Plan-schedule sleep study      COPD mixed type (HCC/RAF) 04/30/2007     Office Spirometry 03/16/2016-severe obstructive airways disease FVC 1.59/59%, FEV1 0.98/49%, ratio 0.62, FEF 25-75% 0.42/28%  PFT 04/16/16-severe obstructive airways disease with slight response to bronchodilator, severe diffusion defect. FEV1/FVC 0.64, DLCO 45%    Last Assessment & Plan:   Severe obstructive airways disease. She can't hear herself wheeze.  Plan-try samples of Trelegy instead of Anoro for comparison    Overview:   Office Spirometry 03/16/2016-severe obstructive airways disease FVC 1.59/59%, FEV1 0.98/49%, ratio 0.62, FEF 25-75% 0.42/28%  PFT 04/16/16-severe obstructive airways disease with slight response to bronchodilator, severe diffusion defect. FEV1/FVC 0.64, DLCO 45%    Last Assessment & Plan:   Severe obstructive airways disease. She can't hear herself wheeze.  Plan-try samples of Trelegy instead of Anoro for comparison  Office Spirometry 03/16/2016-severe obstructive airways disease FVC 1.59/59%, FEV1 0.98/49%, ratio 0.62, FEF 25-75% 0.42/28%  PFT 04/16/16-severe obstructive airways disease with slight response to bronchodilator, severe diffusion defect. FEV1/FVC 0.64, DLCO 45%    Last Assessment & Plan:   Severe COPD but mainly emphysema with limited bronchospasm component that could be addressed with bronchodilators.  Plan-try sample Bevespi as a maintenance controller, refill pro-air      Esophageal reflux 04/30/2007     History of fundoplication     Last Assessment & Plan:   Emphasized continued attention to antireflux measures. Reminded that reflux can be a significant trigger for irritable airways, cough and wheeze.  History of fundoplication       Last Assessment & Plan:   Emphasized continued attention to antireflux measures. Reminded that reflux can be a significant trigger for irritable airways, cough and wheeze.      Eustachian tube dysfunction 04/30/2007     Annotation: decreased hearing  Qualifier: Diagnosis of   By: Joshua CNA/MA, Jessica       Last Assessment & Plan:   She is nearly deaf on a chronic basis. Management is mostly by her ENT physicians.      Fibromyalgia 04/30/2007     Qualifier: Diagnosis of   By: Joshua CNA/MA, Harlene Dan: Diagnosis of   By: Joshua CNA/MA, Jessica      Seasonal and perennial allergic rhinitis 04/30/2007     Qualifier: Diagnosis of   By: Joshua CNA/MA, Harlene  Last Assessment & Plan:   She is now on prednisone  20 mg daily. I suggested she wait until her myasthenia status is stabilized. Then if she needs to she couldn't seek evaluation at one of the allergy practices in town as discussed, since our vaccine program will be closing.  Qualifier: Diagnosis of   By: Joshua CNA/MA, Jessica       Last Assessment & Plan:   She is now on prednisone  20 mg daily. I suggested she wait until her myasthenia status is stabilized. Then if she needs to she couldn't seek evaluation at one of the allergy practices in town as discussed, since our vaccine program will be closing.     Tobacco: smoked  For 30 years     Surgical History:  Past Surgical History:   Procedure Laterality Date    MR KNEE LEFT Left     meniscus       Gyn Hx:  Menses 15   G2P2- first birth 16  Menopause :50's   Menopausal status: Postmenopausal  Hormone therapy use: None      Family and Social History  Family History   Adopted: Yes     Social History     Tobacco Use    Smoking status: Former     Current packs/day: 0.00     Types: Cigarettes     Quit date: 1985     Years since quitting: 41.0    Smokeless tobacco: Never   Substance Use Topics    Alcohol use: Never       MEDS     No outpatient medications have been marked as taking for the 03/11/24 encounter (Telemedicine) with Ely Carte, MD.     Allergies   Allergen Reactions    Beta Adrenergic Blockers Other (See Comments)     Avoid d/t Myasthenia Gravis  Avoid d/t Myasthenia Gravis      Levofloxacin Other (See Comments)     Flare of Myasthenia Gravis    Macrolides And Ketolides Other (See Comments)     Avoid d/t Myasthenia Gravis  Use with caution d/t Myasthenia Gravis  Use with caution d/t Myasthenia Gravis      Sulfa Antibiotics Other (See Comments)     May possibly have caused deafness in one ear  May possibly have caused deafness in one ear  other      Botulinum Toxins Other (See Comments)     Muscle weakness r/t Myasthenia Gravis  Muscle weakness r/t Myasthenia Gravis      Pollen Extract Wheezing    Statins Other (See Comments)     Muscle aches  Muscle aches  Muscle aches      Plastic Tape Itching and Other (See Comments)     Turns the skin red where applied       PHYSICAL EXAM     There were no vitals filed for this visit.           There is no height or weight on file to calculate BMI.  Wt Readings from Last 3 Encounters:   03/03/24 60 kg (132 lb 4.4 oz)   02/11/24 57.6 kg (127 lb)   02/06/24 58.5 kg (129 lb)       Review of Systems:  Pertinent items are noted in HPI.      Physical Exam:  General: No acute distress. Appears well-developed, well-nourished and close to stated age.   Head: Normocephalic, atraumatic.  Eyes: Sclera anicteric. EOMI.  ENT: Hearing grossly normal bilaterally. Oropharynx  is clear, mucus membranes are moist.  No oral ulcers noted. Good dentition.  Neck: Supple. Trachea midline.   Cardiac: Regular rate and rhythm. Normal S1, S2. No murmurs, rubs, or gallops.  Respiratory: Respiratory effort appears normal.   Extremities: Warm. No edema. No cyanosis.  Neurologic: Gait appears normal. Sensation intact to light touch in all four extremities. Oriented to person, place and time.   Hematologic: No bruising, purpura or petechiae are noted.   Dermatologic: Skin intact.  No rashes appreciated.   Psychiatric: Affect appropriate.  Pleasant and conversant.     Breast exam: +mass in the left breast, 11:00 position, 5 cmfn measuring 2 x 2 cm      Recent Labs:  Lab Results   Component Value Date    WBC 5.06 03/03/2024    HGB 10.6 (L) 03/03/2024    HCT 32.3 (L) 03/03/2024    MCV 97.3 03/03/2024    PLT 291 03/03/2024      Lab Results   Component Value Date    NA 141 03/03/2024    K 4.0 03/03/2024    CL 111 (H) 03/03/2024    CO2 26 03/03/2024    CREAT 1.74 (H) 03/03/2024    BUN 24 (H) 03/03/2024     Lab Results Component Value Date    ALT 8 02/11/2024    AST 14 02/11/2024    ALKPHOS 65 02/11/2024    BILITOT 0.36 02/11/2024     Lab Results   Component Value Date    CALCIUM  9.9 02/11/2024    PHOS 3.6 02/02/2024       Pertinent imaging:  04/26/2023 Diagnostic mammo/US   IMPRESSION:  Finding 1:  Stable solid masses in the right breast at 1 o'clock and 12 o'clock are known  malignancies. Surgical and oncologic consultation is ongoing. Clinical management of known  malignancy is recommended.  Finding 2:  Stable complicated cyst versus benign appearing solid mass in the right breast at 12  o'clock is benign.  Finding 3:  Microclip in the upper outer quadrant of the right breast at 10 o'clock located 2  centimeters from the nipple corresponds to known high risk lesion. Surgical consultation continues  to be recommended.  Finding 4:  Irregular mass in the left breast at 11 o'clock is a known malignancy. This has both  mammographically and sonographically slightly increased in size from 09/2022. Surgical and oncologic  consultation is ongoing. Clinical management of known malignancy is recommended.  BI-RADS Category 6:  Known Biopsy-Proven Malignancy     10/18/2022 Diagnostic mammogram/US   IMPRESSION:  Finding 1:  Solid mass in the right breast at 1 o'clock and 12 o'clock are known malignancies.  Surgical and oncologic consultation is ongoing. Clinical management of known malignancy is  recommended.  Finding 2:  Microclip in the right breast at 10 o'clock located 2 centimeters from the nipple  corresponds to known high risk lesion. Surgical consultation is ongoing.  Finding 3:  Complicated cyst versus benign appearing solid mass in the right breast at 12 o'clock is  probably benign. Sonographic follow-up in 6 months is recommended.  Finding 4:  Irregular mass in the left breast at 11 o'clock is a known malignancy. This has  mammographically increased in size from 07/13/2020 and sonographically slightly increased in size  from 01/26/2022. Surgical and oncologic consultation is ongoing. Clinical management of known  malignancy is recommended.  Finding 5:  Vague hypoechoic area in the left breast at 12 o'clock is stable from 06/13/2020 and is  benign.  Finding 6:  Complicated cyst versus benign appearing solid mass in the left breast at 1 o'clock is  stable from 06/13/2020 and is benign.  BI-RADS Category 6:  Known Biopsy Proven Malignancy    01/26/2022 Mammo/US  diagnostic, bilat  IMPRESSION:  1. Biopsy proven malignancies of right mass at 1:00 and 12:00 both at 6 cm FN (X and ribbon clip)  have decreased in size sonographically. Right breast area with coil microclip stable in appearance  on mammogram. Appropriate action should be taken based on pathology results.  2. Increasing calcifications right breast compared to prior mammogram. Unclear if this is  progression of disease as prior MRI demonstrates trace residual enhancement. If breast conservation  surgery is of interest, stereotactic biopsy may be performed.  3. RIght breast probably benign mass at 12:00 6 cm FN stable, recommend follow up US  in 6 months.  4. L breast 11:00 5 cm FN biopsy proven malignancy has slightly increased in size sonographically  compared to prior ultrasound.  5. Probably benign masses of left breast at 12:00 3 cm FN and 1:00 1 cm FN are stable, recommend  follow up US  in 6 months.  6. Stable thickened left axillary lymph node, continued surgical/oncologic consultation is  recommended.    11/13/2021 CT chest wo  History of bilateral breast cancer without definite metastatic disease in the thorax     05/03/2021 MR breast  1. Right breast:   *  Interval decrease in size of known malignancies in the right breast with trace residual enhancement. Recommend continued surgical and oncological management. BI-RADS CATEGORY 6 - KNOWN BIOPSY-PROVEN MALIGNANCY.      2. Left breast:  *  Grossly stable irregular mass at 11 o'clock corresponding to biopsy-proven malignancy. Recommend continued surgical and oncological management. BI-RADS CATEGORY 6 - KNOWN BIOPSY-PROVEN MALIGNANCY.   *  Interval resolution of previously described regional nonmass enhancement in the upper inner, upper outer, and lower outer quadrants. Recommend continued surgical management. BI-RADS CATEGORY 4 - SUSPICIOUS.      3. Additional findings:  *  STIR hyperintense fluid collection in the right glenohumeral joint/subacromial region may represent bursitis. Correlate with symptoms/physical exam.     OVERALL ASSESSMENT: BI-RADS CATEGORY 4 - SUSPICIOUS.    05/03/2021 MR cervical spine  Redemonstrated congenital cervical spinal canal stenosis with superimposed multilevel degenerative changes, which are mildly progressive compared to prior as above. No evidence of cord compression or cord signal abnormality.     03/14/2021 NM stress test  1) There is a moderate-severe, fixed perfusion defect in the distal anteroseptal wall and apex suggestive for a prior MI.   No scintigraphic evidence for stress-induced ischemia.    2) Normal left ventricular size and reduced global LV systolic function, EF 36%. There are regional wall motion abnormalities present as indicated above.  3) For physiologic and electrocardiographic findings, please refer to a separate report.  4) There are no prior studies available for comparison.      12/18/2020 CT abd/pelvis wo contrast  1.  No acute CT abnormality in the abdomen or pelvis.  2.  Moderate hiatal hernia with postsurgical changes likely related to reported prior Nissen fundoplication.  3.  Left-sided diverticulosis without evidence diverticulitis.  4.  History of bilateral breast cancer without evidence of metastatic disease within the abdomen or pelvis.    12/18/2020 CTA chest   1.  No acute aortic process. No acute process within the chest.  2.  History of bilateral breast cancer without evidence  of intrathoracic metastatic disease.  3.  Additional findings as above.    11/18/2020 MRI breast bilat  1. Right breast:   *  Stable to slightly decreased size of known biopsy proven malignancies in the right breast. BI-RADS CATEGORY 6 - KNOWN BIOPSY-PROVEN MALIGNANCY. Recommend continued surgical and oncologic management.      2. Left breast:  *  Decreased size of the irregular mass with microclip at 11:00, 5 cm from the nipple, consistent with known malignancy. BI-RADS CATEGORY 6 - KNOWN BIOPSY-PROVEN MALIGNANCY. Recommend continued surgical and oncologic management.   *  Nonmass enhancement spanning 40 mm has also slightly decreased in size and now demonstrates subthreshold kinetics. This remains suspicious. If breast conservation is desired, an MRI guided biopsy is recommended. BI-RADS CATEGORY 4 - SUSPICIOUS.  *  Previously described oval enhancing mass at 4:00 is no longer identified. BI-RADS CATEGORY 2.     OVERALL ASSESSMENT: BI-RADS CATEGORY 4 - SUSPICIOUS.    08/26/2020 DXA lumbar spine/hip  -----------------------------------------------------------------   Region                   BMD    T-score  Z-score   Classification   -----------------------------------------------------------------   AP Spine(L1-L4)          1.062    0.1      2.9       Normal   Femoral Neck (Left)      0.723   -1.1      1.3       Osteopenia   Total Hip (Left)         0.725   -1.8      0.4       Osteopenia   -----------------------------------------------------------------   Impression: The BMD for  the AP Spine(L1-L4) decreased, changing by -2.4% since the last DXA exam.     08/22/2020 PETCT  1.  Mild to moderate left greater than right FDG activity associated with small soft tissue densities in the bilateral breasts in the regions marked by breast biopsy clips.   2.  No evidence of FDG avid metastatic disease.    06/19/2019 US  pelvis  Small subserosal uterine fundal fibroid. Otherwise normal postmenopausal pelvic ultrasound.    Pertinent pathology:  03/21/2022 Tissue exam  FINAL DIAGNOSIS  BREAST, RIGHT, CALCIFICATIONS, 10:00, 2 CM FROM NIPPLE (MAMMO-GUIDED CORE  BIOPSY):  - Atypical ductal hyperplasia (ADH)  - Background breast with fibrosis, microcysts, and apocrine metaplasia  - Calcifications are present within ADH, apocrine metaplasia, and benign stroma  - No carcinoma identified (multiple deeper levels examined     08/02/2020 [facility]      07/13/2020 Tissue exam  A. BREAST, RIGHT, MASS, 12:00, 6 CM FROM NIPPLE (NEEDLE CORE BIOPSY):  - Invasive ductal carcinoma with lobular and focal cribriform features, grade 2 (50% of biopsy; longest segment 4 mm)             Modified Bloom and Richardson score: 6 of 9                        Tubule formation:                3                        Nuclear pleomorphism:       2  Mitotic score:                       1  - Ductal carcinoma in situ (DCIS), low nuclear grade, cribriform type with focal central necrosis  - Breast biomarkers: See below  - ERBB2 (HER2) FISH: Negative for HER2 gene amplification      B.  BREAST, LEFT, MASS, 11:00, 5 CM FROM NIPPLE (NEEDLE CORE BIOPSY):  - Invasive ductal carcinoma with lobular features, grade 2 (50% of biopsy; longest involved segment 6 mm)             Modified Bloom and Richardson score: 6 of 9                        Tubule formation:                3                        Nuclear pleomorphism:       2                        Mitotic score:                       1  - Ductal carcinoma in situ (DCIS) is not identified  - Breast biomarkers: See immunohistochemistry report below  - ERBB2 (HER2) FISH: Negative [based on IHC (2+) and FISH, see comment].     COMMENT: It is uncertain whether patients with an average of >4.0 and <6.0 HER2 signals per cell and a HER2/CEP17 ratio of <2.0 benefit from HER2-targeted therapy in the absence of protein overexpression (IHC 3+). If the specimen test result is close to the FISH ratio threshold for positive, there is a higher likelihood that repeat testing will result in different results by chance alone. Therefore, when IHC results are not 3+ positive, it is recommended that the sample be considered negative without additional testing on the specimen.    BREAST BIOMARKER REPORT     BLOCK:  A1  FIXATIVE:  Formalin      RESULT ER/PR:    ESTROGEN   RECEPTOR PROGESTERONE RECEPTOR   Antibody Clone SP1 Clone 636   %Tumor Staining >95% >95%   Intensity (1+ to 3+) 3+ 3+      RESULT HER-2/neu IHC assay (utilizing FDA-approved DAKO Hercep Test): Arch Pathol Lab Med [phone], ASCO/CAP HER2 Testing in Breast Cancer Update - Kassie et al     Test Score: 1+     HER2/neu:  Negative     RESULT Ki-67 (Clone MIB1):  5%     BREAST BIOMARKER REPORT     BLOCK:  B1  FIXATIVE:  Formalin      RESULT ER/PR:    ESTROGEN   RECEPTOR PROGESTERONE RECEPTOR   Antibody Clone SP1 Clone 636   %Tumor Staining >95% 0%   Intensity (1+ to 3+) 3+ NA      RESULT HER-2/neu IHC assay (utilizing FDA-approved DAKO Hercep Test): Arch Pathol Lab Med 2018:1-20, ASCO/CAP HER2 Testing in Breast Cancer Update - Kassie dunker al     Test Score: 2+     HER2/neu:  Equivocal     RESULT Ki-67 (Clone MIB1):  10%    07/19/2017 Outside pathology        ASSESSMENT & PLAN   SHRAVYA WICKWIRE is a 89 y.o.  female presents for     1. 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 She elected not to have surgery at that time. She took tamoxifen -->anastrozole  for a short period of time, but discontinued endocrine therapy due to side effects.   - R breast biopsy at 12:00, 6 cm from nipple on 07/13/2020 revealed invasive ductal carcinoma with lobular and focal cribriform features, grade 2 ER (+) PR (+) HER2 (-), Ki-67 5%  - L breast biopsy at 11:00, 5 cm from nipple on 07/13/2020 revealed invasive ductal carcinoma with lobular features, grade 2 ER (+) PR (-) HER2 (-), Ki-67 10%  - We had a long discussion about biology and treatment of breast cancer, with emphasis that cure can only be attained with multimodality treatments such as surgery/ +/- radiation and systemic ( endocrine) therapy .  - We have discussed that given her concern for anesthesia with Myasthenia Gravis, we can try neoadjuvant endocrine therapy with close observation of side effects.  - Letrozole  2.5 mg po daily 08/17/2020 --> 10/28/2020 discontinued due to diarrhea  - Tumors are not palpable on exam, and she would have to be followed with imaging   -  PETCT on 08/22/2020 with no evidence of FDG avid metastatic disease.   - Arimidex  10/28/2020 ---> 11/13/2021 d/c due to diarrhea   - Patient has opted against breast surgery d/t age, co morbidities, anaesthetic risk iso myasthenia gravis  - She would like to explore alternatives to surgery such as radiation. Discussed case with rad/onc team and will obtain repeat breast imaging. She consulted with Dr. Annabelle who advised that she is not currently a candidate for RT  - MR abd/pelv 11/23/2021 (-)   - Will try switching to Exemestane  25mg  po daily to evaluate for improvement in diarrhea- suspect class effect, but will try 11/14/2021 --->  - Diagnostic bilateral MMG/US  01/26/2022 with stable findings. Repeat in 6 months   - S/p stereotactic bx of right breast mass 03/21/2022. Pathology shows ADH   - Diagnostic mammo/US  10/17/2022 with stable findings. Repeat imaging in 6 months    07/24/2023   - Continue exemestane  25 mg po daily   - Diagnostic bilateral breast mammo/US  04/26/2023 stable  - Plan to repeat imaging in 6 mos, 09/2023   - RTC in 3 months  - Labs from 5/22 reviewed     02/11/24  - discussed CT scan in 10/2023 and again on 01/30/24 showed 15 mm lucent lesion in the left acetabulum and is again seen, which may represent a subchondral cyst, but given history of breast cancer may be concerning for a new lytic lesion   - labs today including tumor markers and SPEP/IFE  - MRI L hip ordered, I reached out to get it scheduled prior to the cardiac pacemaker appt  - continue exemestane  25mg  PO daily    03/11/24:   - discussed MRI hip from 03/02/24 with Posterior acetabular cystic lesion measuring up to 17 mm, currently indeterminant. This may represent a subchondral cyst, particularly given smaller cystic changes seen at the right posterior acetabulum. However, given the patient's provided cancer   history and the fact that this is growing/new since 2022, recommendations include: (1) short term 57-month interval follow-up, (2) further evaluation with nuclear medicine bone scan versus PET/CT, or (3) tissue sampling  - cancer markers CA 27.29 and CA 15-3  are wnl  - she doesn't want to get bx and MRI would be an issue with her new pacemaker, will get PET/CT 2-3 months  - continue exemestane  25mg  PO daily.        2.  Diarrhea  - Longstanding, hx of IBS, but exacerbated by AI use, similar on letrozole  and arimidex .  - Consult with Dr. Nulsen 02/10/2021. May be related to aromatase inhibitor. However she states it has been worse in the last 1-2 months and this medication class was started earlier in the year (attributed diarrhea to letrozole , swithced to arimidex  in Sept). Will r/o infection, check FC. Can consider SIBO evaluation, colonoscopy for microscopic colitis.   - Held anastrazole 10/18/2021-->11/13/2021 with resolution of diarrhea.   - MR Abd/Pelv for further assessment given >20lbs weight loss associated with diarrhea 9/28 (-) for metastatic disease.   - Strongly encouraged patient to submit stool studies ordered by Dr. Nulsen  - Will start with 1/2 tablet imodium  q AM and increase to 1 tablet daily as needed  - Consider lomotil if imodium  is ineffective or not tolerated  - Switched to exemestane  11/14/2021        3.  Myasthenia gravis   - Followed by Dr. Daryel    - Continue azathioprine , IVIG, mestinon      4. Bone density  - DXA scan on 08/26/2020 demonstrated osteopenia of the left femoral neck and total hip    07/24/2023  - Repeat DXA ordered 12/25/2023, patient asked to schedule       5. HFrEF  - TTE 01/22/24 with EF 27.5%  - s/p permanent pacemaker on 03/03/24 with cardiology         DIAGNOSES/ORDERS addressed on 03/11/2024:  1. Bilateral malignant neoplasm of breast in female, estrogen receptor positive, unspecified site of breast (HCC/RAF)    2. Lytic bone lesion of hip    3. Heart failure with reduced ejection fraction LVEF <=40% (HCC/RAF)      - on exemestane   - continue close monitoring for toxicity of intensive drug therapy.    NEW PROBLEMS AS OF 03/11/2024:  Breast cancer with L hip lesion   []  New minor or self-limited problem (99202/99212).  []  2+ minor or self-limited problem, stable chronic illness, acute uncomplicated illness/injury (99203/99213).  [x]  Chronic illness with exacerbation/progression/tx side effect; 2+ stable chronic illnesses, new problem with uncertain prognosis, acute illness with systemic symptoms, or acute complicated injury (99204/99214).  []  Chronic illness with severe exacerbation/progression/tx side effect, acute/chronic illness or injury that poses threat to life or bodily function (99205/99215).      REVIEW OF DATA   I have  [x]  Reviewed/ordered []  1 []  2 [x]  >= 3 unique laboratory, radiology, and/or diagnostic tests noted below  No orders of the defined types were placed in this encounter.      [x]  Reviewed []  1 []  2 [x]  >= 3 prior external notes and incorporated into patient assessment   I reviewed Dr. CARLIE K. THOMPSON, MD in Surgery, Surgical Oncology on 03/07/2022.  I reviewed Dr. PENNE LOIS QUAKER, MD in Medicine, Internal Medicine on 10/27/2023.  I reviewed Dr. Not Found in Not Found on Not Found.    []  Discussed management or test interpretation with external provider(s) on 03/11/2024 as noted:    RISK OF COMPLICATION   This writer has deemed the above diagnoses to have a risk of complication, morbidity or mortality of:   []  Minimal  []  Low  [x]  Moderate   []  due to prescription  drug management   [] Decision re: minor surgery   [] Diagnosis or treatment limited by social determinants of health  []  Severe    []  due to intensive monitoring for chemotherapy toxicity   []  Decision elective surgery with risk factors  []  Decision regarding need for hospitalization  []  Advanced Care Planning decisions    SOCIAL DETERMINANTS OF HEALTH   []  The diagnosis or treatment of said conditions is significantly limited by social determinants of health:      I spent 30 minutes counseling and coordinating care for the items checked below on the day of service 03/11/2024  [x]  Preparing to see the patient (e.g., review of tests)  [x]  Obtaining and or reviewing separately obtained history   [x]  Performing a medically appropriate examination and/or evaluation   [x]  Counseling and educating the patient/family/caregiver   [x]  Ordering medications, tests, or procedures  []  Referring and communicating with other healthcare professionals (when not separately reported)  [x]  Documenting clinical information in the EHR  []  Independently interpreting results and communicating results to patient/ family/caregiver     Return in about 3 months (around 06/09/2024).       The above recommendation were discussed with the patient. The patient has all questions answered satisfactorily and is in agreement with this recommended plan of care.    Author: Tonja Medal, MD

## 2024-03-11 ENCOUNTER — Ambulatory Visit: Payer: BLUE CROSS/BLUE SHIELD

## 2024-03-11 ENCOUNTER — Ambulatory Visit: Payer: MEDICARE

## 2024-03-11 ENCOUNTER — Other Ambulatory Visit: Payer: Commercial Managed Care - Pharmacy Benefit Manager

## 2024-03-11 ENCOUNTER — Telehealth: Payer: BLUE CROSS/BLUE SHIELD

## 2024-03-11 ENCOUNTER — Telehealth: Payer: PRIVATE HEALTH INSURANCE

## 2024-03-11 DIAGNOSIS — Z170 Estrogen receptor positive status [ER+]: Secondary | ICD-10-CM

## 2024-03-11 DIAGNOSIS — C50612 Malignant neoplasm of axillary tail of left female breast: Secondary | ICD-10-CM

## 2024-03-11 MED ORDER — DOXYCYCLINE HYCLATE 100 MG PO CAPS
100 mg | ORAL_CAPSULE | Freq: Two times a day (BID) | ORAL | 0 refills | 7.00000 days | Status: AC
Start: 2024-03-11 — End: ?

## 2024-03-11 NOTE — Telephone Encounter
 PDL Call to Clinic    Reason for Call:    Symptom: Allergic Reaction (General)  Outcome: Urgent Call - Do NOT Schedule Appointment  Reason: Hives or rash all over the body that started in the past 24 hours  Appointment Related?  []  Yes  [x]  No     If yes;  Date:  Time:    Call warm transferred to PDL: [x]  Yes  []  No    Call Received by Clinic Representative:    If call not answered/not accepted, call received by Patient Services Representative:

## 2024-03-11 NOTE — Telephone Encounter
 Outpatient Cardiac Electrophysiology Phone Call     Encounter: [facility] contacted [facility]; callback was provided.      Patient summary: 89 year old female with a past medical history of  HFrEF with EF 20%, LBBB with QRS 152 ms, NICM, Myasthenia gravis, COPD on home O2 PRN s/p dual chamber pacemaker on 03/03/24 by Dr. Myrle.      Chief Concern: Calls in reporting redness and itching under Tegaderm used to cover pacemaker incision. Pt is also experiencing what she describes as hot flashes, which would happen occasionally prior to her PPM implant however have ben happening more frequently over the last few weeks. She denies chest pain, nausea, vomiting, or chills. Her gauze is clean and dry. She is concerned for allergic reaction.      Plan:   - I asked pt to come into clinic for in person examination of incision however she does note drive and has limited walking ability, she is unable to come in today for evaluation. Her care giver can bring her in tomorrow so incision check appointment was changed from 1/16 to 1/15.   - Will start patient on PO Doxycycline  as precautionary measure as it is unclear whether pt is experiencing fever.  - I spoke with [employer] Manager/land Agent) form her assisted living facility who informed me that if I sent prescription to [facility] pharmacy, they will deliver to patient today.  Patient to start Doxycyline 100 mg po BID x 7 days today.    - The patient is unable to send a photo. She does not wish to remove bandage, as she is uncomfortable doing so. I asked pt to keep site clean and dry, and to avoid applying any lotions to the area. I suggested Benadryl  however pt wishes to hold off until tomorrow's appointment.      [name], [place]  Nurse Practitioner  [facility] Clinical Cardiac Electrophysiology

## 2024-03-11 NOTE — Telephone Encounter
 Call Back Request      Reason for call back: patient called to advise she does not know how to work the portal and would rather Dr. Ely call her. She disconnected call when placing her on hold please advise.     CB: [phone]    Any Symptoms:  []  Yes  [x]  No      If yes, what symptoms are you experiencing:    Duration of symptoms (how long):    Have you taken medication for symptoms (OTC or Rx):      If call was taken outside of clinic hours:    [] Patient or caller has been notified that this message was sent outside of normal clinic hours.     [] Patient or caller has been warm transferred to the physician's answering service. If applicable, patient or caller informed to please call us  back if symptoms progress.  Patient or caller has been notified of the turnaround time of 1-2 business day(s).

## 2024-03-12 ENCOUNTER — Ambulatory Visit: Payer: PRIVATE HEALTH INSURANCE

## 2024-03-12 NOTE — Patient Instructions
 May shower today  May raise left arm above shoulder level.  Avoid scrubbing the incision site  Avoid lotion, powder, or oil on the incision site until fully healed.  DO NOT submerge in water (beach, pool, ocean, bathtub) until fully healed.  Avoid rigorous swinging of the left arm and contact sports for a total of 6 weeks from the implantation date.   Enroll in remote transmission  Remote transmission every 3 months  To schedule device check and see Dr.  in 3 months.  Call if you hear any beeping sounds from your device as this indicates your device needs to be checked immediately.  For URGENT concerns (having symptoms such as but not limited to pain, discharge from the site, swelling, opening on the incision site, fever, chills, palpitations, shortness of breath, dizziness, and chest pain), please call our office at [phone] and ask for nurse practitioners during clinic hours (Monday to Friday 8am to 5 pm). If you reached us  after hours, weekends or holidays, your call will be directed to the operator. Please have them paged the on-call EP fellow.

## 2024-03-12 NOTE — Progress Notes
 Early for incision check, rescheduled for Tuesday 03/17/2024. Pt verbalized understanding.

## 2024-03-13 ENCOUNTER — Ambulatory Visit: Payer: PRIVATE HEALTH INSURANCE

## 2024-03-17 ENCOUNTER — Ambulatory Visit: Payer: MEDICARE

## 2024-03-17 DIAGNOSIS — I4430 Unspecified atrioventricular block: Principal | ICD-10-CM

## 2024-03-17 NOTE — Patient Instructions
 May shower today  May raise left arm above shoulder level.  Avoid scrubbing the incision site  Avoid lotion, powder, or oil on the incision site until fully healed.  DO NOT submerge in water (beach, pool, ocean, bathtub) until fully healed.  Avoid rigorous swinging of the left arm and contact sports for a total of 6 weeks from the implantation date.   Enroll in remote transmission  Remote transmission every 3 months  To schedule device check and see Dr. Myrle  in 3 months.  Call if you hear any beeping sounds from your device as this indicates your device needs to be checked immediately.  For URGENT concerns (having symptoms such as but not limited to pain, discharge from the site, swelling, opening on the incision site, fever, chills, palpitations, shortness of breath, dizziness, and chest pain), please call our office at [phone] and ask for nurse practitioners during clinic hours (Monday to Friday 8am to 5 pm). If you reached us  after hours, weekends or holidays, your call will be directed to the operator. Please have them paged the on-call EP fellow.

## 2024-03-17 NOTE — Progress Notes
 TITLE: CARDIAC ARRHYTHMIA CLINIC NOTE  DATE: 03/17/2024    CLINICAL HISTORY: 88 y.o. female with NICM HFrEF wth LBBB QRS 152 ms -  status post dual chamber pacemaker implantation on 03/03/2024.    Returns for incision check.  No incisional pain or fevers.     EXAM:  There were no vitals taken for this visit.  left pectoral incision now fully healed. No redness or tenderness    IMPRESSION:   Incision well healed.    PLAN:  May shower today  May raise left arm above shoulder level.  Avoid scrubbing the incision site  Avoid lotion, powder, or oil on the incision site until fully healed.  DO NOT submerge in water (beach, pool, ocean, bathtub) until fully healed.  Avoid rigorous swinging of the left arm and contact sports for a total of 6 weeks from the implantation date.   Enroll in remote transmission  Remote transmission every 3 months  To schedule device check and see Dr.  in 3 months.  Call if you hear any beeping sounds from your device as this indicates your device needs to be checked immediately.  For URGENT concerns (having symptoms such as but not limited to pain, discharge from the site, swelling, opening on the incision site, fever, chills, palpitations, shortness of breath, dizziness, and chest pain), please call our office at [phone] and ask for nurse practitioners during clinic hours (Monday to Friday 8am to 5 pm). If you reached us  after hours, weekends or holidays, your call will be directed to the operator. Please have them paged the on-call EP fellow.

## 2024-03-18 NOTE — Progress Notes
 OUTPATIENT GERIATRICS CLINIC NOTE    PATIENT:  [name]   MRN:  [id number]  DOB:  [birth year]  DATE OF SERVICE:  03/19/2024  PRIMARY CARE PHYSICIAN: [name], MD    CHIEF COMPLAINT:   No chief complaint on file.      [facility]:  [facility]--Neurology      [facility]:    Chaperone status:  No data recorded    HISTORY OF PRESENT ILLNESS     [name] is a 89 y.o. female who presents today for *follow up**. Patient is accompanied by *no one**. History today is per the patient,and review of available recent records in [facility] and Care Everywhere.     Patient is a(n) [x]  reliable []  unreliable historian and additional collateral information was obtained during the visit today from:       Oxygen tanks ran out of oxygen.    Had dual chamber PM placed.    Per chart review, pertinent medical history:  Past Medical History:   Diagnosis Date    Asthma     Breast cancer (HCC/RAF) 12/20/2018    CHF (congestive heart failure) (HCC/RAF)     COPD (chronic obstructive pulmonary disease) (HCC/RAF)     Diabetes mellitus (HCC/RAF)     Hypertension     Renal disorder      Past Surgical History:   Procedure Laterality Date    MR KNEE LEFT Left     meniscus       ALLERGIES     Allergies   Allergen Reactions    Beta Adrenergic Blockers Other (See Comments)     Avoid d/t Myasthenia Gravis  Avoid d/t Myasthenia Gravis      Levofloxacin Other (See Comments)     Flare of Myasthenia Gravis    Macrolides And Ketolides Other (See Comments)     Avoid d/t Myasthenia Gravis  Use with caution d/t Myasthenia Gravis  Use with caution d/t Myasthenia Gravis      Sulfa Antibiotics Other (See Comments)     May possibly have caused deafness in one ear  May possibly have caused deafness in one ear  other      Botulinum Toxins Other (See Comments)     Muscle weakness r/t Myasthenia Gravis  Muscle weakness r/t Myasthenia Gravis      Pollen Extract Wheezing    Statins Other (See Comments)     Muscle aches  Muscle aches  Muscle aches      Plastic Tape Itching and Other (See Comments)     Turns the skin red where applied        MEDICATIONS     Personally reviewed.    No outpatient medications have been marked as taking for the 03/19/24 encounter (Appointment) with [name], MD.       SOCIAL HISTORY     Social History     Social History Narrative    Not on file        FUNCTIONAL STATUS     BADLs:  IADLs:    Ambulates with   Falls in past year:  Afraid of falling:    GERIATRIC REVIEW OF SYSTEMS     Vision:    []   glasses     []  legally blind  Hearing: []  hearing aids    Nutrition: []  normal   []  impaired  []  vegan  []  vegetarian  []  low salt  []  low-carb   Swallowing: []  impaired  Dentures:   []  yes    Depression:  []  yes   Cognition:     Incontinence: [] urine  []  fecal  []  urine and fecal      ADVANCED CARE PLANNING     Advanced directives on file: []  No  []   Yes ? Completed:     Medical DPOA on file:   []   Yes ?   []   No ? Patient designates ** * to be their surrogate medical decision-maker.         HEALTH CARE MAINTENANCE     IMMUNIZATIONS:   Immunization History   Administered Date(s) Administered    COVID-19 [employer] and up) PF, 30 mcg/0.3 mL 09/24/2023, 02/06/2024    COVID-19 [employer] Fall 2023 12y and up) PF, 30 mcg/0.3 mL 03/27/2022    COVID-19, mRNA, (Pfizer - Purple Cap) 30 mcg/0.3 mL 03/11/2019, 04/01/2019, 10/29/2019    COVID-19, mRNA, bivalent (Pfizer) 30 mcg/0.3 mL (12y and up) 11/25/2020, 07/05/2021    COVID-19, mRNA, tris-sucrose (Pfizer - [employer]) 30 mcg/0.3 mL 06/20/2020    DTaP 12/31/2015    influenza vaccine IM quadrivalent (Fluzone Quad) MDV (86 months of age and older) 11/26/2016    influenza vaccine IM quadrivalent adjuvanted (FluAD Quad) (PF) SYR (69 years of age and older) 11/05/2018, 12/01/2019, 11/25/2020    influenza vaccine IM quadrivalent high dose (Fluzone High Dose Quad) (PF) SYR (20 years of age and older) 11/22/2021    influenza vaccine IM trivalent high dose (Fluzone High Dose) (PF) SYR (46 years of age and older) 12/11/2022, 02/06/2024    influenza, unspecified formulation 05/10/2014, 12/29/2015, 11/26/2016, 04/01/2019, 04/01/2019, 12/01/2019, 12/01/2019, 12/01/2019, 12/01/2019, 11/25/2020, 11/25/2020, 01/03/2022, 12/11/2022    meningococcal conjugate 4-valent (Menveo 2-vial) IM MCV4O 01/23/2023    pneumococcal conjugate vaccine 20-valent (Prevnar 20) 07/05/2021    pneumococcal polysaccharide vaccine 23-valent (Pneumovax) 01/10/2019       Mammogram:  DXA:  PAP:  Colonoscopy:      PHYSICAL EXAM     BP 128/72  ~ Pulse (!) 104  ~ Temp 36.1 ?C (97 ?F) (Temporal)  ~ Resp 16  ~ Wt 136 lb (61.7 kg)  ~ SpO2 94%  ~ BMI 22.63 kg/m?   Wt Readings from Last 3 Encounters:   03/19/24 136 lb (61.7 kg)   03/03/24 132 lb 4.4 oz (60 kg)   02/11/24 127 lb (57.6 kg)     84% RA!!!    System Check if normal Positive or additional negative findings   GEN  [x]  NAD     Eyes  []  Conj/Lids []  Pupils  []  Fundi   []  Sclerae []  EOM     ENT  []  External ears   []  Otoscopy   []  Gross Hearing    []   External nose   []  Nasal mucosa   []  Lips/teeth/gums    []  Oropharynx    []  Mucus membranes      Neck  [x]  Inspection/palpation    [x]  Thyroid     Resp  [x]  Effort    []  Auscultation    Distant Breath sounds   CV  [x]  Rhythm/rate   [x]  Murmurs   [x]  Edema   []  JVP non-elevated    Normal pulses:   []  Radial []  Femoral  []  Pedal  +HJR   Breast  []  Inspection []  Palpation     GI  []  Bowel sounds    [x]  Nontender   [x]  No distension    []  No rebound or guarding   []   No masses   []  Liver/spleen    []  Rectal     GU  F:  []  External []  vaginal wall         []  Cervix     []  mucus        []  Uterus    []  Adnexa   M:  []  Scrotum []  Penis         []  Prostate     Lymph  []  Cervical []  supraclavicular     []  Axillae   []  Groin/inguinal     MSK []  Gait  []  Back     Specify site examined:    []  Inspect/palp []  ROM   []  Stability []  Strength/tone []  Used arms to push up from seated to standing position   Assistive device:  []  single point cane []  quad cane  []  FWW []  rollator walker      Skin  []  Inspection []  Palpation     Neuro  []  Alert and oriented     []  CN2-12 intact grossly   []  DTR      []  Muscle strength      []  Sensation   []  Pronator drift   []  Finger to Nose/Heel to Shin   []  Romberg     Psych  []  Insight/judgement     []  Mood/affect    []  Gross cognition            LABS/STUDIES     LABS:  Lab Results   Component Value Date    WBC 5.06 03/03/2024    WBC 7.50 02/11/2024    WBC 6.62 02/02/2024    HGB 10.6 (L) 03/03/2024    HGB 13.8 02/11/2024    HGB 10.8 (L) 02/02/2024    MCV 97.3 03/03/2024    PLT 291 03/03/2024    PLT 344 02/11/2024    PLT 211 02/02/2024     Lab Results   Component Value Date    NA 141 03/03/2024    NA 141 02/11/2024    NA 138 02/02/2024    K 4.0 03/03/2024    K 4.68 02/11/2024    K 4.0 02/02/2024    CREAT 1.74 (H) 03/03/2024    CREAT 2.2 (H) 02/11/2024    CREAT 1.65 (H) 02/02/2024    GFRESTNOAA 29 06/09/2020    GFRESTNOAA 28 05/18/2020    GFRESTNOAA 30 03/23/2020    GFRESTAA 34 06/09/2020    GFRESTAA 33 05/18/2020    GFRESTAA 35 03/23/2020    CALCIUM  9.9 02/11/2024    CALCIUM  9.0 02/02/2024    CALCIUM  9.0 02/01/2024     Lab Results   Component Value Date    ALT 8 02/11/2024    ALT 10 02/02/2024    AST 14 02/11/2024    AST 18 02/02/2024    ALKPHOS 65 02/11/2024    BILITOT 0.36 02/11/2024    ALBUMIN 4.4 02/11/2024    ALBUMIN 4.2 02/11/2024     Lab Results   Component Value Date    TSH 1.6 09/24/2023    TSH 2.5 04/17/2023    TSH 1.5 12/11/2022     Lab Results   Component Value Date    HGBA1C 6.0 (H) 04/17/2023    HGBA1C 5.9 (H) 07/05/2022    HGBA1C 5.3 05/03/2022     Lab Results   Component Value Date    CHOL 160 12/11/2022    CHOL 205 07/05/2022    CHOL 226 01/07/2022     Lab Results  Component Value Date    CHOLDLQ 76 12/11/2022    CHOLDLQ 135 (H) 07/05/2022    CHOLDLQ 144 (H) 01/07/2022     Lab Results   Component Value Date    VITD25OH 76 10/18/2021     Lab Results   Component Value Date    FE 87 07/14/2019    FERRITIN 57 07/14/2019    FOLATE 9.6 11/25/2019    TIBC 356 07/14/2019     Lab Results   Component Value Date    VITAMINB12 3,994 (H) 11/25/2019    VITAMINB12 217 (L) 09/22/2019     Lab Results   Component Value Date    BNP 525 (H) 01/21/2024    BNP 498 (H) 10/31/2023    BNP 355 (H) 08/19/2023     No results found for: ''PSATOTAL''      STUDIES:      EKG  Jan 21, 2024  NSR, LBBB        XR CHEST PA LAT 2V  September 22, 2019   COMPARISON: [facility]UB December 17, 2018     History: sob     FINDINGS:     Lungs: Clear  Heart/aorta: Normal heart size. The thoracic aorta is mildly calcified, tortuous and ectatic.  Adenopathy: None  Pleura: No effusion  Bones and Chest wall: No acute bony or body wall  findings. Osteopenia and bony maturational changes. Right upper quadrant cholecystectomy clips stable from October 2020        IMPRESSION:     No acute findings or abnormalities related to provided history.      MRI C spine  May 03, 2021  IMPRESSION:  Redemonstrated congenital cervical spinal canal stenosis with superimposed multilevel degenerative changes, which are mildly progressive compared to prior as above. No evidence of cord compression or cord signal abnormality.      Echo  Jan 2023  1. Normal left ventricular size.  2. There are LV regional wall motion abnormalities.  3. Left ventricular ejection fraction is approximately 35 to 40%.  4. Abnormal LV diastolic function.  5. There are no prior studies on this patient for comparison purposes.  ASSESSMENT and PLAN     [name] is a 89 y.o. female who presents today for *follow up**.    #Diarreha--was related to overflow.  Better.  Will continue bowel meds.  #Major Depression--active--sTolerating Duloxetine . Maintaining clonazepam  given severe anxiety now.  #Myesthenia Gravis--not worse. On  ULTOMIRIS . Need to clarify timing of most recent infusion.  #HFrEF--EF 20%noted on echo-- 2023--Worse.  Chronic.  Will monitor and treat underlying risk factors with medical and lifestyle interventions as warranted by balance between benefits and burdens.  Had planned Biventricular syncronized pacer 100%  and will request evaluation for this procedure while in house.  #CAD--discussed medical management alone with shared decision making model May 11, 2021.  Declines stent.  #Cervical myelopathy--monitored by neuro--chronic--re-imaged on MRI as above.  CTM.  #HTN--Increased nifedipine  CR 60 mg daily. Continue losartan  50 mg.  BP Better.    #CKD--stage 4--chronic--CTM--control BP as tolerated by symptoms of orthostasis and fatigue.  Trial off trimethoprim .  REcheck Cr now and in two weeks.  #GERD--s/p Nisan fundoplication  #COPD--mixed--per PFT's from [place] .--albuterol  prn.  May resume other inhalders.  #s/p rectocele repair  #s/p cholecystectomy  #Immunosupressed status--medication related.  Chronic. CTM  #Nocturnal hypoxemia--presumably related to MG crisis but has history of COPD.  Will order nocturnal home O2 study and consider PFT's/Pulmonary evaluation once settled down.  May need to restart BREO inhaler.  #Myasthenia Gravis--s/p plasmapheresis x5 October 2020, no Thymoma, on Azathioprine  150 daily and mestinon  60 tid.  Saw Dr. Montell at [facility].  Was considering Soliris.  Ordering IVIG and MRI.  Encourage ongoing neurology follow up.   #IBS--titrate miralax  to comfort.    #Interstitial cystitis--stopping daily Trimethoprim  100 for UTI prophylaxis.  Using bladder instillations prn. OFF topical estrogen due to advice from neurologist re: MG.    #Breast CA--under care.  Patient continues to decline surgery.    #DM with CKD--stabe 3b--based on labs from [place] .  #OA knees--previously getting hyaluronic acid injections.  #Anxiety  #Benzodiazepine Dependence--uncomplicated--chronic.  Encourage patient to decrease intake--ideally to abstaining but will work towards this goal over time.  Not likely to achieve this given ongoing concerns re: breast ca.  #Fibromyalgia--duloxetine  as above.   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  #Statin intolerance    #Advanced directives--SNR/DNI/No feeding tubes.    FOLLOW-UP     RTC     Future Appointments   Date Time Provider Department Center   03/19/2024 10:40 AM [name], MD GERI IMS [address] [place]/Cen   04/02/2024 11:20 AM [name], MD GERI IMS [address] [place]/Cen   04/07/2024 11:00 AM SM 2020 ECHO 03 CARDIOLOGY CARDIAC IMAGING CI [id number] [facility]   04/23/2024  1:00 PM Martha Deland CROME., AuD AUD [id number] [place]/Cen   04/23/2024  1:30 PM Martha Deland CROME., AuD AUD TT079 [place]/Cen   05/05/2024 11:00 AM Leavy Gruber, MD CRD DIS 2020 [facility]   06/02/2024  1:30 PM SMO PCT01 (JV) PET [facility]   06/18/2024  3:00 PM [place], MP1 CRD DEVICE [place]/Cen   06/18/2024  3:30 PM Myrle Fines, MD CRD EP 690 [place]/Cen         The above plan of care, diagnosis, orders, and follow-up were discussed with the patient and/or surrogate. Questions related to this recommended plan of care were answered.    ANALYSIS OF DATA (Needs to meet 1 category for moderate and 2 categories for high LOS)     I have:     Category 1 (Needs 3 for moderate and high LOS)     []  Reviewed []  1 []  2 []  >= 3 unique laboratory, radiology, and/or diagnostic tests noted below    Test/Study:  on date .    []  Reviewed []  1 []  2 []  >= 3 prior external notes and incorporated into patient assessment    I reviewed Dr. 's note in specialty  from date .    []  Discussed management or test interpretation with external provider(s) as noted      []  Ordered []  1 []  2 []  >= 3 unique laboratory, radiology, and/or diagnostic tests noted in A&P    []  Obtained history from independent historian:       Category 2  []  Independently interpreted the test    Category 3  []  Discussed management or test interpretation with external provider(s) as noted      INTERACTION COMPLEXITY and SOCIAL DETERMINANTS of HEALTH       PROBLEM COMPLEXITY   []  New problem with uncertain diagnosis or prognosis (moderate)   []  Multiple stable chronic problems (moderate)   []  Chronic problem not stable - not controlled, symptomatic, or worsening (moderate)   []  Severe exacerbation of chronic problem (high)   []  New or chronic problem that poses threat to life or bodily function (high)     MANAGEMENT COMPLEXITY   []   Old or external/outside records reviewed   []  Discussion with alternate (proxy) if patient with impaired communication / comprehension ability (e.g., dementia, aphasia, severe hearing loss).   []  Repeated questions (or disagreement) between patient and/among caregivers/family during the visit.  []  Caregiver/patient emotions/behavior/beliefs interfering with implementation of treatment plan.  []  Independent interpretation of test (EKG, Chest XRay)   []  Discussion of case with a consultant physician     RISK LEVEL   []  Prescription drug management (moderate)   []  Minor surgery with CV risk factors or elective major surgery (moderate)   []  Dx or Rx significantly limited by SDoH (inadequate housing, living alone, poor health care access, inappropriate diet; low literacy) (moderate)   []  Major surgery - elective with CV risk factors or emergent (high)   []  Need for hospitalization (high)   []  New DNR or de-escalation of care (high)      SDoH  The diagnosis or treatment of said conditions is significantly limited by the following social determinants of health:  []  Z59.0 Homelessness  []  Z59.1 Inadequate housing  []  Z59.2 Discord with neighbors, lodgers and landlord  []  Z60.2 Problems related to living alone  []  Z59.8 Other problems related to housing and economic circumstances  []  Z59.4 Lack of adequate food and safe drinking water  []  Z59.6 Low income  []  Z59.7 Insufficient social insurance and welfare support  []  Z59.9 Problems related to housing and economic circumstances, unspecified  []  Z75.3 Unavailability and inaccessibility of health care facilities  []  Z75.4 Unavailability and inaccessibility of other helping agencies  []  Z72.4 Inappropriate diet and eating habits  []  Z62.820 Parent-biological child conflict  []  Z63.8 Other specified problems related to primary support group  []  Z55.0 Illiteracy and low level literacy   []  Z56.9 Unspecified problems related to employment        If Billing Based on Time:     I performed the following items on the day of service:    [x]  Preparing to see the patient (e.g., review of tests)  [x]  Obtaining and/or reviewing separately obtained history   [x]  Performing a medically appropriate examination and/or evaluation   [x]  Counseling and educating the patient/family/caregiver   [x]  Ordering medications, tests, or procedures  [x]  Referring and communicating with other healthcare professionals (when not separately reported)  [x]  Documenting clinical information in the EHR  []  Independently interpreting results and communicating results to patient/family/caregiver    I spent the following total amount of time on these tasks on the day of service:  New Patient     Established Patient  []  15-29 minutes - 99202    []  up to 9 minutes - 99211  []  30-44 minutes - 99203     []  10-19 minutes - 99212   []  45-59 minutes - 99204      []  20-29 minutes - 99213   []  60-74 minutes - 99205   [x]  30- minutes - 99214         []  40-55 minutes - 99215    []  I spent an additional ** * 15-minute-increment(s) for a total of * ** minutes on these tasks on the day of service. [phone] for each additional 15 minutes.)    Author: Penne LOIS Quaker, MD 03/19/2024

## 2024-03-19 ENCOUNTER — Ambulatory Visit: Payer: MEDICARE | Attending: Geriatric Medicine

## 2024-03-19 DIAGNOSIS — E1122 Type 2 diabetes mellitus with diabetic chronic kidney disease: Secondary | ICD-10-CM

## 2024-03-19 DIAGNOSIS — Z0110 Encounter for examination of ears and hearing without abnormal findings: Principal | ICD-10-CM

## 2024-03-19 DIAGNOSIS — G959 Disease of spinal cord, unspecified: Secondary | ICD-10-CM

## 2024-03-19 DIAGNOSIS — N1832 Type 2 diabetes mellitus with stage 3b chronic kidney disease, without long-term current use of insulin (HCC/RAF): Secondary | ICD-10-CM

## 2024-03-19 DIAGNOSIS — N184 Chronic kidney disease, stage 4 (severe): Secondary | ICD-10-CM

## 2024-03-19 DIAGNOSIS — G7000 Myasthenia gravis without (acute) exacerbation: Secondary | ICD-10-CM

## 2024-03-20 ENCOUNTER — Telehealth: Payer: Commercial Managed Care - Pharmacy Benefit Manager

## 2024-03-20 MED ORDER — METOPROLOL SUCCINATE ER 25 MG PO TB24
ORAL_TABLET | ORAL | 11 refills | 30.00000 days | Status: AC
Start: 2024-03-20 — End: 2024-03-24

## 2024-03-20 NOTE — Telephone Encounter
 Faxed DME Order to     [facility]   P [phone]    F [phone]

## 2024-03-23 ENCOUNTER — Telehealth: Payer: PRIVATE HEALTH INSURANCE

## 2024-03-23 MED ORDER — METOPROLOL SUCCINATE ER 25 MG PO TB24
25 mg | ORAL_TABLET | Freq: Every day | ORAL | 3 refills | 30.00000 days | Status: AC
Start: 2024-03-23 — End: ?

## 2024-03-24 NOTE — Telephone Encounter
 Form was submitted by patient's PCP
